# Patient Record
Sex: Male | Born: 1945 | Race: Black or African American | Hispanic: No | Marital: Married | Smoking: Current every day smoker
Health system: Southern US, Community
[De-identification: ages and names within clinical notes are randomized; demographics above are authoritative.]

## PROBLEM LIST (undated history)

## (undated) DIAGNOSIS — M1990 Unspecified osteoarthritis, unspecified site: Secondary | ICD-10-CM

## (undated) DIAGNOSIS — I6529 Occlusion and stenosis of unspecified carotid artery: Secondary | ICD-10-CM

## (undated) DIAGNOSIS — D649 Anemia, unspecified: Secondary | ICD-10-CM

## (undated) DIAGNOSIS — R911 Solitary pulmonary nodule: Secondary | ICD-10-CM

## (undated) DIAGNOSIS — I10 Essential (primary) hypertension: Secondary | ICD-10-CM

## (undated) DIAGNOSIS — F4310 Post-traumatic stress disorder, unspecified: Secondary | ICD-10-CM

## (undated) DIAGNOSIS — N183 Chronic kidney disease, stage 3 unspecified: Secondary | ICD-10-CM

## (undated) DIAGNOSIS — F209 Schizophrenia, unspecified: Secondary | ICD-10-CM

## (undated) DIAGNOSIS — I480 Paroxysmal atrial fibrillation: Secondary | ICD-10-CM

## (undated) DIAGNOSIS — Z87442 Personal history of urinary calculi: Secondary | ICD-10-CM

## (undated) DIAGNOSIS — G8929 Other chronic pain: Secondary | ICD-10-CM

## (undated) DIAGNOSIS — N189 Chronic kidney disease, unspecified: Secondary | ICD-10-CM

## (undated) DIAGNOSIS — A159 Respiratory tuberculosis unspecified: Secondary | ICD-10-CM

## (undated) DIAGNOSIS — M549 Dorsalgia, unspecified: Secondary | ICD-10-CM

## (undated) DIAGNOSIS — I739 Peripheral vascular disease, unspecified: Secondary | ICD-10-CM

## (undated) DIAGNOSIS — I5022 Chronic systolic (congestive) heart failure: Secondary | ICD-10-CM

## (undated) DIAGNOSIS — I420 Dilated cardiomyopathy: Secondary | ICD-10-CM

## (undated) DIAGNOSIS — K219 Gastro-esophageal reflux disease without esophagitis: Secondary | ICD-10-CM

## (undated) DIAGNOSIS — F0390 Unspecified dementia without behavioral disturbance: Secondary | ICD-10-CM

## (undated) DIAGNOSIS — G629 Polyneuropathy, unspecified: Secondary | ICD-10-CM

## (undated) DIAGNOSIS — M21371 Foot drop, right foot: Secondary | ICD-10-CM

## (undated) DIAGNOSIS — K5900 Constipation, unspecified: Secondary | ICD-10-CM

## (undated) HISTORY — PX: HERNIA REPAIR: SHX51

## (undated) HISTORY — PX: COLONOSCOPY: SHX174

## (undated) HISTORY — PX: APPENDECTOMY: SHX54

## (undated) HISTORY — PX: SHOULDER SURGERY: SHX246

## (undated) HISTORY — PX: EYE SURGERY: SHX253

## (undated) HISTORY — PX: CHOLECYSTECTOMY: SHX55

## (undated) HISTORY — PX: BLADDER SURGERY: SHX569

## (undated) HISTORY — DX: Anemia, unspecified: D64.9

## (undated) HISTORY — PX: BACK SURGERY: SHX140

---

## 1998-04-19 ENCOUNTER — Emergency Department (HOSPITAL_COMMUNITY): Admission: EM | Admit: 1998-04-19 | Discharge: 1998-04-19 | Payer: Self-pay | Admitting: Internal Medicine

## 2000-07-08 ENCOUNTER — Encounter: Admission: RE | Admit: 2000-07-08 | Discharge: 2000-10-06 | Payer: Self-pay | Admitting: Internal Medicine

## 2002-09-25 ENCOUNTER — Inpatient Hospital Stay (HOSPITAL_COMMUNITY): Admission: EM | Admit: 2002-09-25 | Discharge: 2002-10-01 | Payer: Self-pay | Admitting: Internal Medicine

## 2002-09-25 ENCOUNTER — Encounter: Payer: Self-pay | Admitting: Emergency Medicine

## 2002-11-03 ENCOUNTER — Ambulatory Visit (HOSPITAL_COMMUNITY): Admission: RE | Admit: 2002-11-03 | Discharge: 2002-11-03 | Payer: Self-pay | Admitting: Internal Medicine

## 2002-11-04 ENCOUNTER — Ambulatory Visit (HOSPITAL_COMMUNITY): Admission: RE | Admit: 2002-11-04 | Discharge: 2002-11-04 | Payer: Self-pay | Admitting: Internal Medicine

## 2002-11-04 ENCOUNTER — Encounter: Payer: Self-pay | Admitting: Internal Medicine

## 2002-12-29 ENCOUNTER — Ambulatory Visit (HOSPITAL_COMMUNITY): Admission: RE | Admit: 2002-12-29 | Discharge: 2002-12-29 | Payer: Self-pay | Admitting: Internal Medicine

## 2003-01-04 ENCOUNTER — Ambulatory Visit (HOSPITAL_COMMUNITY): Admission: RE | Admit: 2003-01-04 | Discharge: 2003-01-04 | Payer: Self-pay | Admitting: Internal Medicine

## 2003-01-19 ENCOUNTER — Ambulatory Visit (HOSPITAL_COMMUNITY): Admission: RE | Admit: 2003-01-19 | Discharge: 2003-01-19 | Payer: Self-pay | Admitting: Urology

## 2003-02-23 ENCOUNTER — Ambulatory Visit (HOSPITAL_COMMUNITY): Admission: RE | Admit: 2003-02-23 | Discharge: 2003-02-23 | Payer: Self-pay | Admitting: Urology

## 2003-03-09 ENCOUNTER — Observation Stay (HOSPITAL_COMMUNITY): Admission: RE | Admit: 2003-03-09 | Discharge: 2003-03-10 | Payer: Self-pay | Admitting: General Surgery

## 2003-06-17 ENCOUNTER — Ambulatory Visit (HOSPITAL_COMMUNITY): Admission: RE | Admit: 2003-06-17 | Discharge: 2003-06-17 | Payer: Self-pay | Admitting: Internal Medicine

## 2003-07-13 ENCOUNTER — Emergency Department (HOSPITAL_COMMUNITY): Admission: EM | Admit: 2003-07-13 | Discharge: 2003-07-13 | Payer: Self-pay | Admitting: Emergency Medicine

## 2004-04-11 ENCOUNTER — Ambulatory Visit (HOSPITAL_COMMUNITY): Admission: RE | Admit: 2004-04-11 | Discharge: 2004-04-11 | Payer: Self-pay | Admitting: Urology

## 2004-05-08 ENCOUNTER — Emergency Department (HOSPITAL_COMMUNITY): Admission: EM | Admit: 2004-05-08 | Discharge: 2004-05-08 | Payer: Self-pay | Admitting: Emergency Medicine

## 2004-06-29 ENCOUNTER — Ambulatory Visit (HOSPITAL_COMMUNITY): Admission: RE | Admit: 2004-06-29 | Discharge: 2004-06-29 | Payer: Self-pay | Admitting: Internal Medicine

## 2004-08-14 ENCOUNTER — Ambulatory Visit: Payer: Self-pay | Admitting: Infectious Diseases

## 2004-08-15 ENCOUNTER — Ambulatory Visit (HOSPITAL_COMMUNITY): Admission: RE | Admit: 2004-08-15 | Discharge: 2004-08-15 | Payer: Self-pay | Admitting: Infectious Diseases

## 2004-09-13 ENCOUNTER — Ambulatory Visit: Payer: Self-pay | Admitting: Infectious Diseases

## 2004-10-03 ENCOUNTER — Ambulatory Visit (HOSPITAL_COMMUNITY): Admission: RE | Admit: 2004-10-03 | Discharge: 2004-10-03 | Payer: Self-pay | Admitting: Internal Medicine

## 2004-12-18 ENCOUNTER — Ambulatory Visit (HOSPITAL_COMMUNITY): Admission: RE | Admit: 2004-12-18 | Discharge: 2004-12-18 | Payer: Self-pay | Admitting: Urology

## 2004-12-22 ENCOUNTER — Inpatient Hospital Stay (HOSPITAL_COMMUNITY): Admission: RE | Admit: 2004-12-22 | Discharge: 2004-12-26 | Payer: Self-pay | Admitting: Neurosurgery

## 2005-03-12 ENCOUNTER — Ambulatory Visit (HOSPITAL_COMMUNITY): Admission: RE | Admit: 2005-03-12 | Discharge: 2005-03-12 | Payer: Self-pay | Admitting: Internal Medicine

## 2005-03-30 ENCOUNTER — Emergency Department (HOSPITAL_COMMUNITY): Admission: EM | Admit: 2005-03-30 | Discharge: 2005-03-31 | Payer: Self-pay | Admitting: Emergency Medicine

## 2005-04-27 ENCOUNTER — Ambulatory Visit (HOSPITAL_COMMUNITY): Admission: RE | Admit: 2005-04-27 | Discharge: 2005-04-27 | Payer: Self-pay | Admitting: Urology

## 2006-04-04 ENCOUNTER — Ambulatory Visit (HOSPITAL_BASED_OUTPATIENT_CLINIC_OR_DEPARTMENT_OTHER): Admission: RE | Admit: 2006-04-04 | Discharge: 2006-04-04 | Payer: Self-pay | Admitting: Orthopedic Surgery

## 2006-08-02 ENCOUNTER — Ambulatory Visit (HOSPITAL_COMMUNITY): Admission: RE | Admit: 2006-08-02 | Discharge: 2006-08-02 | Payer: Self-pay | Admitting: Internal Medicine

## 2006-08-16 ENCOUNTER — Encounter: Payer: Self-pay | Admitting: Infectious Diseases

## 2007-01-09 ENCOUNTER — Ambulatory Visit (HOSPITAL_COMMUNITY): Admission: RE | Admit: 2007-01-09 | Discharge: 2007-01-09 | Payer: Self-pay | Admitting: Urology

## 2007-03-10 ENCOUNTER — Ambulatory Visit (HOSPITAL_COMMUNITY): Admission: RE | Admit: 2007-03-10 | Discharge: 2007-03-10 | Payer: Self-pay | Admitting: Internal Medicine

## 2007-07-07 ENCOUNTER — Ambulatory Visit (HOSPITAL_COMMUNITY): Admission: RE | Admit: 2007-07-07 | Discharge: 2007-07-07 | Payer: Self-pay | Admitting: Internal Medicine

## 2007-10-16 ENCOUNTER — Ambulatory Visit (HOSPITAL_COMMUNITY): Admission: RE | Admit: 2007-10-16 | Discharge: 2007-10-16 | Payer: Self-pay | Admitting: Neurosurgery

## 2008-02-06 ENCOUNTER — Inpatient Hospital Stay (HOSPITAL_COMMUNITY): Admission: RE | Admit: 2008-02-06 | Discharge: 2008-02-10 | Payer: Self-pay | Admitting: Neurosurgery

## 2008-02-16 ENCOUNTER — Ambulatory Visit: Payer: Self-pay | Admitting: Vascular Surgery

## 2008-02-16 ENCOUNTER — Encounter (INDEPENDENT_AMBULATORY_CARE_PROVIDER_SITE_OTHER): Payer: Self-pay | Admitting: Neurosurgery

## 2008-02-16 ENCOUNTER — Ambulatory Visit: Admission: RE | Admit: 2008-02-16 | Discharge: 2008-02-16 | Payer: Self-pay | Admitting: Neurosurgery

## 2008-10-05 ENCOUNTER — Encounter: Admission: RE | Admit: 2008-10-05 | Discharge: 2008-10-05 | Payer: Self-pay | Admitting: General Surgery

## 2008-10-08 ENCOUNTER — Ambulatory Visit (HOSPITAL_BASED_OUTPATIENT_CLINIC_OR_DEPARTMENT_OTHER): Admission: RE | Admit: 2008-10-08 | Discharge: 2008-10-08 | Payer: Self-pay | Admitting: General Surgery

## 2009-03-09 ENCOUNTER — Ambulatory Visit (HOSPITAL_COMMUNITY): Admission: RE | Admit: 2009-03-09 | Discharge: 2009-03-09 | Payer: Self-pay | Admitting: Internal Medicine

## 2009-03-10 ENCOUNTER — Inpatient Hospital Stay (HOSPITAL_COMMUNITY): Admission: EM | Admit: 2009-03-10 | Discharge: 2009-03-11 | Payer: Self-pay | Admitting: Emergency Medicine

## 2009-06-22 ENCOUNTER — Ambulatory Visit (HOSPITAL_COMMUNITY): Admission: RE | Admit: 2009-06-22 | Discharge: 2009-06-22 | Payer: Self-pay | Admitting: Internal Medicine

## 2010-02-11 ENCOUNTER — Encounter: Payer: Self-pay | Admitting: Internal Medicine

## 2010-04-10 LAB — COMPREHENSIVE METABOLIC PANEL
ALT: 21 U/L (ref 0–53)
Albumin: 4.2 g/dL (ref 3.5–5.2)
Calcium: 9.3 mg/dL (ref 8.4–10.5)
Chloride: 104 mEq/L (ref 96–112)
Creatinine, Ser: 1.2 mg/dL (ref 0.4–1.5)
Total Protein: 6.7 g/dL (ref 6.0–8.3)

## 2010-04-10 LAB — URINALYSIS, ROUTINE W REFLEX MICROSCOPIC
Glucose, UA: 500 mg/dL — AB
Ketones, ur: NEGATIVE mg/dL
Leukocytes, UA: NEGATIVE
Nitrite: NEGATIVE
Specific Gravity, Urine: 1.005 (ref 1.005–1.030)
Urobilinogen, UA: 0.2 mg/dL (ref 0.0–1.0)

## 2010-04-10 LAB — DIFFERENTIAL
Basophils Relative: 1 % (ref 0–1)
Neutro Abs: 3 10*3/uL (ref 1.7–7.7)
Neutrophils Relative %: 58 % (ref 43–77)

## 2010-04-10 LAB — CBC
HCT: 30.7 % — ABNORMAL LOW (ref 39.0–52.0)
Hemoglobin: 10.5 g/dL — ABNORMAL LOW (ref 13.0–17.0)
RBC: 3.29 MIL/uL — ABNORMAL LOW (ref 4.22–5.81)
RDW: 13.6 % (ref 11.5–15.5)

## 2010-04-10 LAB — URINE MICROSCOPIC-ADD ON

## 2010-04-12 LAB — DIFFERENTIAL: Basophils Absolute: 0 10*3/uL (ref 0.0–0.1)

## 2010-04-12 LAB — POCT I-STAT, CHEM 8
Calcium, Ion: 1.23 mmol/L (ref 1.12–1.32)
Chloride: 106 mEq/L (ref 96–112)
Glucose, Bld: 239 mg/dL — ABNORMAL HIGH (ref 70–99)
Hemoglobin: 11.6 g/dL — ABNORMAL LOW (ref 13.0–17.0)
Potassium: 4.4 mEq/L (ref 3.5–5.1)
TCO2: 28 mmol/L (ref 0–100)

## 2010-04-12 LAB — CK TOTAL AND CKMB (NOT AT ARMC)
CK, MB: 5.6 ng/mL — ABNORMAL HIGH (ref 0.3–4.0)
CK, MB: 6.4 ng/mL (ref 0.3–4.0)
CK, MB: 7.6 ng/mL (ref 0.3–4.0)
Relative Index: 1.3 (ref 0.0–2.5)
Total CK: 415 U/L — ABNORMAL HIGH (ref 7–232)

## 2010-04-12 LAB — GLUCOSE, CAPILLARY
Glucose-Capillary: 105 mg/dL — ABNORMAL HIGH (ref 70–99)
Glucose-Capillary: 185 mg/dL — ABNORMAL HIGH (ref 70–99)
Glucose-Capillary: 190 mg/dL — ABNORMAL HIGH (ref 70–99)
Glucose-Capillary: 42 mg/dL — CL (ref 70–99)
Glucose-Capillary: 46 mg/dL — ABNORMAL LOW (ref 70–99)

## 2010-04-12 LAB — URINALYSIS, ROUTINE W REFLEX MICROSCOPIC
Ketones, ur: NEGATIVE mg/dL
Nitrite: NEGATIVE
pH: 5 (ref 5.0–8.0)

## 2010-04-12 LAB — LIPID PANEL
Triglycerides: 80 mg/dL (ref <150)
VLDL: 16 mg/dL (ref 0–40)

## 2010-04-12 LAB — TROPONIN I
Troponin I: 0.01 ng/mL (ref 0.00–0.06)
Troponin I: 0.01 ng/mL (ref 0.00–0.06)

## 2010-04-12 LAB — HEMOGLOBIN A1C
Hgb A1c MFr Bld: 6.3 % — ABNORMAL HIGH (ref 4.6–6.1)
Hgb A1c MFr Bld: 6.4 % — ABNORMAL HIGH (ref 4.6–6.1)
Mean Plasma Glucose: 137 mg/dL

## 2010-04-12 LAB — APTT: aPTT: 26 seconds (ref 24–37)

## 2010-04-12 LAB — BASIC METABOLIC PANEL
BUN: 11 mg/dL (ref 6–23)
CO2: 27 mEq/L (ref 19–32)
CO2: 28 mEq/L (ref 19–32)
Calcium: 8.9 mg/dL (ref 8.4–10.5)
Calcium: 9.3 mg/dL (ref 8.4–10.5)
Creatinine, Ser: 0.91 mg/dL (ref 0.4–1.5)
GFR calc Af Amer: >60 mL/min (ref >60)
GFR calc non Af Amer: >60 mL/min (ref >60)
Glucose, Bld: 66 mg/dL — ABNORMAL LOW (ref 70–99)
Sodium: 140 mEq/L (ref 135–145)

## 2010-04-12 LAB — CBC
HCT: 33.4 % — ABNORMAL LOW (ref 39.0–52.0)
Hemoglobin: 11.1 g/dL — ABNORMAL LOW (ref 13.0–17.0)
Hemoglobin: 11.6 g/dL — ABNORMAL LOW (ref 13.0–17.0)
MCHC: 34 g/dL (ref 30.0–36.0)
MCHC: 34.2 g/dL (ref 30.0–36.0)
Platelets: 235 10*3/uL (ref 150–400)
RBC: 3.46 MIL/uL — ABNORMAL LOW (ref 4.22–5.81)
RDW: 13.4 % (ref 11.5–15.5)
WBC: 6 10*3/uL (ref 4.0–10.5)

## 2010-04-12 LAB — PROTIME-INR: INR: 1.07 (ref 0.00–1.49)

## 2010-04-12 LAB — URINE MICROSCOPIC-ADD ON

## 2010-04-12 LAB — TSH: TSH: 0.19 u[IU]/mL — ABNORMAL LOW (ref 0.350–4.500)

## 2010-04-28 LAB — CBC
HCT: 30.9 % — ABNORMAL LOW (ref 39.0–52.0)
Hemoglobin: 10.5 g/dL — ABNORMAL LOW (ref 13.0–17.0)
MCV: 94.9 fL (ref 78.0–100.0)
Platelets: 214 10*3/uL (ref 150–400)
RDW: 13.5 % (ref 11.5–15.5)

## 2010-04-28 LAB — BASIC METABOLIC PANEL
BUN: 9 mg/dL (ref 6–23)
Chloride: 102 mEq/L (ref 96–112)
GFR calc non Af Amer: >60 mL/min (ref >60)
Glucose, Bld: 255 mg/dL — ABNORMAL HIGH (ref 70–99)
Potassium: 4.2 mEq/L (ref 3.5–5.1)
Sodium: 138 mEq/L (ref 135–145)

## 2010-04-28 LAB — DIFFERENTIAL
Basophils Absolute: 0 10*3/uL (ref 0.0–0.1)
Eosinophils Absolute: 0.1 10*3/uL (ref 0.0–0.7)
Eosinophils Relative: 2 % (ref 0–5)
Lymphocytes Relative: 30 % (ref 12–46)
Lymphs Abs: 1.6 10*3/uL (ref 0.7–4.0)
Monocytes Absolute: 0.4 10*3/uL (ref 0.1–1.0)

## 2010-04-28 LAB — GLUCOSE, CAPILLARY
Glucose-Capillary: 44 mg/dL — ABNORMAL LOW (ref 70–99)
Glucose-Capillary: 57 mg/dL — ABNORMAL LOW (ref 70–99)

## 2010-05-08 LAB — GLUCOSE, CAPILLARY
Glucose-Capillary: 104 mg/dL — ABNORMAL HIGH (ref 70–99)
Glucose-Capillary: 78 mg/dL (ref 70–99)
Glucose-Capillary: 165 mg/dL — ABNORMAL HIGH (ref 70–99)
Glucose-Capillary: 167 mg/dL — ABNORMAL HIGH (ref 70–99)
Glucose-Capillary: 185 mg/dL — ABNORMAL HIGH (ref 70–99)
Glucose-Capillary: 189 mg/dL — ABNORMAL HIGH (ref 70–99)
Glucose-Capillary: 193 mg/dL — ABNORMAL HIGH (ref 70–99)
Glucose-Capillary: 244 mg/dL — ABNORMAL HIGH (ref 70–99)
Glucose-Capillary: 291 mg/dL — ABNORMAL HIGH (ref 70–99)
Glucose-Capillary: 317 mg/dL — ABNORMAL HIGH (ref 70–99)
Glucose-Capillary: 58 mg/dL — ABNORMAL LOW (ref 70–99)
Glucose-Capillary: 82 mg/dL (ref 70–99)

## 2010-05-08 LAB — COMPREHENSIVE METABOLIC PANEL
Albumin: 4.2 g/dL (ref 3.5–5.2)
BUN: 16 mg/dL (ref 6–23)
Chloride: 99 mEq/L (ref 96–112)
Creatinine, Ser: 1.2 mg/dL (ref 0.4–1.5)
GFR calc non Af Amer: >60 mL/min (ref >60)
Total Bilirubin: 0.9 mg/dL (ref 0.3–1.2)

## 2010-05-08 LAB — CBC
HCT: 33.7 % — ABNORMAL LOW (ref 39.0–52.0)
MCV: 94.5 fL (ref 78.0–100.0)
Platelets: 231 10*3/uL (ref 150–400)
WBC: 6.9 10*3/uL (ref 4.0–10.5)

## 2010-05-08 LAB — POCT I-STAT GLUCOSE
Glucose, Bld: 114 mg/dL — ABNORMAL HIGH (ref 70–99)
Glucose, Bld: 80 mg/dL (ref 70–99)
Operator id: 230421
Operator id: 230421

## 2010-05-08 LAB — POCT I-STAT 4, (NA,K, GLUC, HGB,HCT): Hemoglobin: 8.8 g/dL — ABNORMAL LOW (ref 13.0–17.0)

## 2010-05-08 LAB — TYPE AND SCREEN: Antibody Screen: NEGATIVE

## 2010-06-06 NOTE — Op Note (Signed)
NAME:  Allen [name], Allen [name] NO.:  [id number]   MEDICAL RECORD NO.:  [id number]          PATIENT TYPE:  INP   LOCATION:  3036                         FACILITY:  [facility]   PHYSICIAN:  Coletta Memos, M.D.     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   PROCEDURE:  1. Posterior lumbar interbody arthrodesis L4-5 with Opal 13-mm PEEK      cages filled with morselized autograft, posterior lumbar interbody      arthrodesis L5-S1 with 11-mm Opal interbody cages packed with      morselized autograft.  2. Posterolateral arthrodesis L4-S1 using both morselized autograft      and allograft.  3. Pedicle screw fixation segmental L4-S1 using Legacy System 6.5 x 50      screws on the right side at L4, L5, and S1, on the left side at L4,      L5 and 45 x 6.5 at S1 on the left.   COMPLICATIONS:  None.   SURGEON:  Coletta Memos, MD   ASSISTANT:  Danae Orleans. Venetia Maxon, MD   INDICATIONS:  Allen [name] is a 65 year old who has significant amount of  low back pain.  He had decided to forego any operative treatment until  recently when he stated he was simply in too much pain to try to  continue.  He therefore opted for operative decompression.   OPERATIVE NOTE:  Allen [name] was brought to the operating room,  intubated and then placed under a general anesthetic without difficulty.  Foley catheter was placed under sterile conditions.  He was rolled prone  onto a Jackson table and all pressure points were properly  padded.  His  back was prepped and he was draped in a sterile fashion.  Using an x-ray  for localization, I infiltrated 20 mL of 0.5% lidocaine and 1:200,000  strength of epinephrine into the lumbar region.  I opened the skin with  #10 blade and took the incision down to the thoracolumbar fascia  sharply.  I then exposed the lamina of L3, L4, L5 and S1 bilaterally.  Took another intraoperative x-ray to localize.  Having done this, I then  proceeded with the posterior lumbar interbody arthrodesis at L4-5 on the  right side using a high-speed drill to perform a semi-hemilaminectomy.  This was done in conjunction with the use of Kerrison punches to remove  bone until the thecal sac could be exposed by removing the ligamentum  flavum in the interlaminar space.  After that was done, significant  facetectomy was done at the L4-5 level again using Kerrison punches in  order to place a cage and then to fully  decompress the L4-L5 roots.  Having done that, I then opened the disk space and started the  diskectomy.  I also used the shavers from the [employer] to remove  more loose soft tissue.  I then turned my attention on the right side  again to L5-S1 performing a semi-hemilaminectomy removing the ligamentum  flavum and exposing the thecal sac.  I retracted that medially and then  was able to gain entry into the very narrowed disk space.  Again removed  soft tissues.  I used disk space shavers and created enough space there  to be able to place a distractor.  I then went to the left side first at  L5-S1, placed an 11-mm distractor and then proceeded with the diskectomy  on the left side.  I used both the rasp and other materials to make sure  that most of the soft tissue was gone.  I used curettes, Kerrison  punches and a drill.  Having decompressed the L5-S1 space, I sized it  and  felt that 11-mm implants would be appropriate.  Using morselized  autograft packed into bone, I then placed the  cages into the L5-S1 space  without difficulty.  I then turned my attention to the left side at L4-  5.   I completed the diskectomy at L4-5 on the left side after using a  distractor on the right to increase the size of our working space.  I  removed enough soft tissue and I felt that I had good endplates for  grafting.  I then sized the space and felt that 13-mm cages would work  best there.  I placed 13-mm cages on the right and left sides without  difficulty.  At that point, I irrigated.  I then proceeded with pedicle  screw placement.   Pedicle screws were placed at L4, L5 and S1 using fluoroscopic guidance  bilaterally.  Each hole was first sounded using the probe and a thinner  probe to make sure that there are no cutouts and pedicles then tapped  and again deepened and probed to ensure no cutouts and screw placement.  AP and lateral films at the end of the case  showed the screws were in  good position.  I then irrigated it once more.  With Dr. Fredrich Birks  assistance, we completed the posterolateral arthrodesis.   The transverse processes of L4, L5 and the ala of the sacrum were  decorticated bilaterally using the high-speed drill.  Morselized auto  and allograft was placed bilaterally.   The rods were then placed into the screw heads and the locking screws  were placed without difficulty.  Construct was in good position.  Bleeding was controlled.  I then irrigated the wound.  I then closed the  wound in layered fashion using Vicryl sutures to reapproximate the  thoracolumbar, subcutaneous and subcuticular layers.           ______________________________  Coletta Memos, M.D.     KC/MEDQ  D:  02/06/2008  T:  02/07/2008  Job:  [id number]

## 2010-06-06 NOTE — Discharge Summary (Signed)
[name], [name] NO.:  [id number]   MEDICAL RECORD NO.:  [id number]          PATIENT TYPE:  INP   LOCATION:  3036                         FACILITY:  [facility]   PHYSICIAN:  Coletta Memos, M.D.     DATE OF BIRTH:  [birth year]   DATE OF ADMISSION:  02/06/2008  DATE OF DISCHARGE:  02/09/2008                               DISCHARGE SUMMARY   ADMITTING DIAGNOSES:  1. Lumbar spondylosis, L4-5.  2. Lumbar spondylosis, L5-S1.  3. Lumbar stenosis, L4 through S1.  4. Degenerative disk disease, L4-5, L5-S1.  5. Lumbago.   DISCHARGE DIAGNOSES:  1. Lumbar spondylosis, L4-5.  2. Lumbar spondylosis, L5-S1.  3. Lumbar stenosis, L4 through S1.  4. Degenerative disk disease, L4-5, L5-S1.  5. Lumbago.   PROCEDURES:  1. Posterior lumbar interbody arthrodesis., L4-5.  2. Oval 13-mm PEEK cages, L5-S1.  3. Oval 11-mm PEEK cages filled with morselized autografts.  4. Posterolateral arthrodesis, L4 through S1.  5. Pedicle screw fixation using Legacy System from [employer].   COMPLICATIONS:  None.   SURGEON:  Coletta Memos, MD   DISCHARGE STATUS:  Alive and well.   DISCHARGE DESTINATION:  Home.   MEDICATIONS:  Percocet.   WOUND:  Clean, dry.  No signs of infection or discharge.   [name] will be sent home today.  He is tolerating a regular diet,  voiding well, walking well, no difficulties.  I will see him back in the  office in about 4 weeks.  No driving for 10 days.  He will receive a  discharge sheet.  No bending, lifting, or twisting.           ______________________________  Coletta Memos, M.D.     KC/MEDQ  D:  02/09/2008  T:  02/09/2008  Job:  [id number]

## 2010-06-09 NOTE — Op Note (Signed)
NAME:  JULIANO, MCEACHIN NO.:  [id number]   MEDICAL RECORD NO.:  [id number]            PATIENT TYPE:   LOCATION:                                 FACILITY:   PHYSICIAN:  Coletta Memos, M.D.          DATE OF BIRTH:   DATE OF PROCEDURE:  DATE OF DISCHARGE:                                 OPERATIVE REPORT   Mr. Tuggle presented with cervical stenosis and myelopathy.   DICTATION ENDED AT THIS POINT           ______________________________  Coletta Memos, M.D.     KC/MEDQ  D:  12/22/2004  T:  12/23/2004  Job:  [id number]

## 2010-06-09 NOTE — Op Note (Signed)
NAME:  Allen Yu, Allen Yu                          ACCOUNT NO.:  [id number]   MEDICAL RECORD NO.:  [id number]                   PATIENT TYPE:  AMB   LOCATION:  DAY                                  FACILITY:  [facility]   PHYSICIAN:  R. Roetta Sessions, M.D.              DATE OF BIRTH:  1945-07-13   DATE OF PROCEDURE:  DATE OF DISCHARGE:                                 OPERATIVE REPORT   PROCEDURE:  Diagnostic colonoscopy.   ENDOSCOPIST:  Gerrit Friends. Rourk, M.D.   INDICATIONS FOR PROCEDURE:  The patient is a 65 year old gentleman recently  worked up with chest pain and anemia.  He was given a couple of units of  blood down in [place] a couple of weeks ago.  Chest pain was worked up as  far as cardiac catheterization.  He was told that he had gastroesophageal  reflux disease.  He has not had any rectal bleeding or melena.  Reflux  symptoms are now controlled on Prilosec OTC.  He has a history of  gallbladder polyps seen on a 1998 ultrasound.  Colonoscopy is now being done  in part for screening and in part for diagnostic purpose.  This approach has  been discussed with the patient previously. The potential risks, benefits,  and alternatives have been reviewed.  Please see my October 12, 2002  office dictation.   PROCEDURE NOTE:  O2 saturation, blood pressure, pulse and respirations were  monitored throughout the entire procedure. Conscious sedation: Versed 3 mg  IV, Demerol 75 mg IV in divided doses.   INSTRUMENT:  Olympus video chip adult colonoscope.   FINDINGS:  Digital rectal exam revealed no abnormalities.   ENDOSCOPIC FINDINGS:  The prep was good.   RECTUM:  Examination of the rectal mucosa including the retroflex view of  the anal verge revealed no abnormalities.   COLON:  The colonic mucosa was surveyed from the rectosigmoid junction  through the left transverse and right colon to the area of the appendiceal  orifice, ileocecal valve, and cecum.  These structures were well  seen and  photographed for the record.   From this level the scope was slowly withdrawn.  All previously mentioned  mucosal surfaces were again seen.  The only abnormalities noted were a 3-mm  polyp at the splenic flexure and two 3-mm polyps at 30 cm in the sigmoid  colon.  These polyps were cold biopsied/removed.  The remainder of the  colonic mucosa appeared normal.  The patient tolerated the procedure well  and was reacted in endoscopy.   IMPRESSION:  1. Normal rectum.  2. Diminutive polyps in the sigmoid and splenic flexure cold     biopsied/removed.  3. The remainder of the colonic mucosa appeared normal.   RECOMMENDATIONS:  1. Will follow up on path.  2. Will go ahead and proceed with a right upper quadrant ultrasound to     follow up on  the previously noted gallbladder polyp to see if he has any     other findings which may have contributed to his chest pain recently.  We     will plan to see this gentleman, back in the office in 4-6 weeks.  3. Follow up on path.      ___________________________________________                                            Jonathon Bellows, M.D.   RMR/MEDQ  D:  11/03/2002  T:  11/03/2002  Job:  [id number]   cc:   Kingsley Callander. Ouida Sills, M.D.  [address]  [place]  [place] [address]  Fax: [phone]

## 2010-06-09 NOTE — H&P (Signed)
NAME:  Allen Yu, Allen Yu NO.:  [id number]   MEDICAL RECORD NO.:  [id number]                   PATIENT TYPE:  AMB   LOCATION:  DAY                                  FACILITY:  [facility]   PHYSICIAN:  Dirk Dress. Katrinka Blazing, M.D.                DATE OF BIRTH:  Jun 18, 1945   DATE OF ADMISSION:  DATE OF DISCHARGE:                                HISTORY & PHYSICAL   HISTORY OF PRESENT ILLNESS:  [age]-year-old male with a history of  recurrent abdominal pain.  The patient has been symptomatic for 5 months.  He has been evaluated by Dr. Jonathon Bellows.  He was found to have  decreased gallbladder function.  The patient also had gallbladder polyp  which was noted to have increased in size since 1998.  The patient states  that this pain is worse in the morning and at night.  He remains symptomatic  and has been referred for cholecystectomy.   PAST HISTORY:  He had chest pain in September 2004.  He had a cardiac  catheterization at that time which was normal.  Other medical illnesses are  diabetes mellitus, hypertension, hyperlipidemia, anxiety disorder, chronic  anemia, status post transfusion, September 2004, gastroesophageal reflux  disease.   MEDICATIONS:  1. Quinine 260 mg p.r.n. for cramps.  2. Humalog insulin 8 units in a.m., 10 units at noon and 5 units p.m.  3. Lantus 65 units at 9 p.m.  4. Risperdal 3 mg nightly.  5. Chlorthalidone 12.5 mg daily.  6. Lisinopril 40 mg daily.  7. Lipitor 40 mg nightly.  8. Aspirin 81 mg daily.  9. Prilosec 20 mg daily.  10.      Hygroton 12.5 mg daily.   ALLERGIES:  DARVON and CODEINE.   PHYSICAL EXAMINATION:  VITAL SIGNS:  On exam, blood pressure 140/76, pulse  80, respirations 20, weight 186 pounds.  HEENT:  Unremarkable.  NECK:  Neck is supple without JVD or bruit.  CHEST:  Chest clear to auscultation.  HEART:  Regular rate and rhythm without murmur, gallop or rub.  ABDOMEN:  Abdomen is soft and nontender.  No  masses.  EXTREMITIES:  No cyanosis, clubbing or edema.  NEUROLOGIC:  No focal motor, sensory or cerebellar deficit.   IMPRESSION:  1. Chronic cholecystitis with gallbladder polyp.  2. Diabetes mellitus.  3. Hypertension.  4. Anxiety disorder.  5. Chronic anemia.  6. Gastroesophageal reflux disease.  7. Hyperlipidemia.   PLAN:  Laparoscopic cholecystectomy.     ___________________________________________                                         Dirk Dress Katrinka Blazing, M.D.   LCS/MEDQ  D:  03/09/2003  T:  03/09/2003  Job:  [id number]

## 2010-06-09 NOTE — H&P (Signed)
NAME:  Allen Yu, Allen Yu NO.:  [id number]   MEDICAL RECORD NO.:  [id number]                   PATIENT TYPE:   LOCATION:                                       FACILITY:   PHYSICIAN:  R. Roetta Sessions, M.D.              DATE OF BIRTH:   DATE OF ADMISSION:  DATE OF DISCHARGE:                                HISTORY & PHYSICAL   CHIEF COMPLAINT:  Reported anemia and hemoccult positive stool.   Allen Yu is a 65 year old African-American who comes to see me today  describing a four-day hospitalization over the Labor Day weekend at [facility] for chest pain.  He describes undergoing cardiac  catheterization and being found to have no blockages.  He was told he had  acid reflux.  He was also told he was anemic and had blood in his stool and  needed to come and see me.  It sounds like he may have seen Dr. Lina Sar.  None of the records pertaining to the above history are available for review  at this time.  He tells me he received 2 pints of blood while he was in the  hospital one month ago.   I saw this gentleman back in 1998 for abdominal pain.  He was fairly  extensively evaluated by me and Dr. Ouida Sills without a cause for his abdominal  pain being found.  He did have a polyp in his gallbladder.  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  He states that since he stopped taking aspirin and stopped  drinking alcohol those symptoms have resolved.  He has not had any melena or  gross blood per rectum.  Again, no abdominal pain.  His weight back in 1998  was 179 pounds; he weighs 184.5 pounds today.  No odynophagia, no dysphagia.  He was given Prilosec 20 mg OTC for his chest pain which he had one month  ago and has not had any of those symptoms either.  Prior barium enema was  negative.  Sigmoidoscopy in 1998 demonstrated only a hyperplastic polyp.  CT  of the abdomen with some thickening  of the gastric wall, muscular  hypertrophy of the colon.   PAST MEDICAL HISTORY:  Significant for hypertension, hypercholesterolemia,  insulin-dependent diabetes mellitus, insomnia, posttraumatic stress syndrome  from [place] (for which he is 100% service-connected through the [employer]   PAST SURGERIES:  Appendectomy, right arm surgery.   CURRENT MEDICATIONS:  1. Prilosec 20 mg OTC.  2. Hygroton 25 mg one-half tablet daily.  3. Amitriptyline 10 mg at bedtime.  4. Lisinopril 40 mg at bedtime.  5. Lipitor 10 mg daily.  6. Quinine 260 mg at bedtime.  7. Flomax 0.4 mg at bedtime.  8. Enteric-coated aspirin 81 mg daily.  9. Risperdal 3 mg at bedtime.  10.  Lantus 65 units at bedtime.  11.      Humalog 20 units in the morning, 15 units at lunch, 8 units in the     evening, 15 units at bedtime.   ALLERGIES:  DARVON, CODEINE.   FAMILY HISTORY:  Mother is alive with lung cancer.  Father has heart disease  and is living.  There is no history of chronic GI or liver illness.   SOCIAL HISTORY:  The patient is divorced, has one child.  He is on  disability.  Smokes one pack of cigarettes per day.  No alcohol.   REVIEW OF SYSTEMS:  As in history of present illness.   PHYSICAL EXAMINATION:  GENERAL:  Reveals a pleasant 65 year old gentleman  resting comfortably.  VITAL SIGNS:  Weight 184.5, blood pressure 130/82, pulse 80.  SKIN:  Warm and dry.  HEENT:  No scleral icterus, conjunctivae are pink.  JVD is not prominent.  CHEST:  Lungs are clear to auscultation.  CARDIAC:  Regular rate and rhythm without murmur, gallop, rub.  ABDOMEN:  Nondistended, positive bowel sounds, soft, nontender, without  appreciable mass or organomegaly.  EXTREMITIES:  No edema.  RECTAL:  Deferred to time of colonoscopy.   IMPRESSION:  Allen Yu is a 65 year old gentleman who comes to see me  with a reported history of anemia requiring transfusion one month ago.  He  had chest pain.  It sounds like  he got a fairly extensive cardiac workup  including cardiac catheterization by his report.  He was told he has  gastroesophageal reflux disease.   I do not have any records for verification/documentation at this time.  At  any rate, he is 65 years old and has never had a full colonoscopy.  To this  end I have offered him a colonoscopy to go ahead and evaluate his lower GI  tract chiefly for colorectal cancer screening purposes.  Potential risks,  benefits, and alternatives have been reviewed.  In the interim hopefully  will get records from [facility] to see what has been done and see  if it sheds any further light on the situation.   He did have a 3 mm filling defect in his gallbladder in 1998 most consistent  with a polyp.  Will make further recommendations in the near future.     ___________________________________________                                         Allen Yu, M.D.   RMR/MEDQ  D:  10/22/2002  T:  10/22/2002  Job:  [id number]   cc:   Kingsley Callander. Ouida Sills, M.D.  [address]  [place]  [place] [address]  Fax: [phone]

## 2010-06-09 NOTE — Cardiovascular Report (Signed)
   NAME:  Allen Yu, Allen Yu NO.:  [id number]   MEDICAL RECORD NO.:  [id number]                   PATIENT TYPE:  INP   LOCATION:                                       FACILITY:  [facility]   PHYSICIAN:  Salvadore Farber, M.D.             DATE OF BIRTH:  1945/01/28   DATE OF PROCEDURE:  DATE OF DISCHARGE:  10/01/2002                              CARDIAC CATHETERIZATION   PROCEDURES PERFORMED:  1. Left heart catheterization.  2. Left ventriculography.  3. Coronary angiography.   CARDIOLOGIST:  Salvadore Farber, M.D.   INDICATIONS:  Mr. Heigl is a 65 year old gentleman with longstanding  diabetes mellitus and hypertension who presents with chest discomfort.  He  has ruled out for myocardial infarction.  Due to his multiple risk factors  he is referred for diagnostic angiography.   PROCEDURAL TECHNIQUE:  Informed consent was obtained.  Under 1% lidocaine  local anesthesia a 6 French sheath was placed in the right femoral artery  using the modified Seldinger technique.  Diagnostic angiography and  ventriculography were performed using JL-4, JR-4 and pigtail catheters.   The patient tolerated the procedure well an was transferred to the holding  room in stable condition.  Sheaths are to be removed there.   COMPLICATIONS:  None.   FINDINGS:  1. LV 157/14/21.  EF 65% without regional wall motion abnormality.   1. No aortic stenosis or mitral regurgitation.   1. Left Main:  Fluoroscopy reveals calcification of the distal vessel;     however, there is no stenosis.   1. LAD:  The LAD is a large vessel wrapping the apex of the heart and giving     rise to two large moderate-sized diagonals.  The vessel is     angiographically normal.   1. Circumflex:  Large vessel giving rise to two obtuse marginals.  It is     angiographically normal.   1. RCA:  Large, dominant vessel.  There is minimal luminal irregularity.   IMPRESSION AND RECOMMENDATIONS:  The  patient has no significant coronary  disease.  I suspect a gastrointestinal etiology to his chest discomfort.   Preventative efforts for coronary disease will be continued given his  multiple risk factors and evidence of coronary calcification.                                                 Salvadore Farber, M.D.    WED/MEDQ  D:  09/30/2002  T:  09/30/2002  Job:  [id number]   cc:   Kingsley Callander. Ouida Sills, M.D.  [address]  [place]  [place] [address]  Fax: [phone]   Vida Roller, M.D.  Fax: [phone]

## 2010-06-09 NOTE — Op Note (Signed)
NAME:  JERETT, ODONOHUE NO.:  [id number]   MEDICAL RECORD NO.:  [id number]          PATIENT TYPE:  AMB   LOCATION:  DSC                          FACILITY:  [facility]   PHYSICIAN:  Loreta Ave, M.D. DATE OF BIRTH:  1945-02-20   DATE OF PROCEDURE:  04/04/2006  DATE OF DISCHARGE:                               OPERATIVE REPORT   PREOPERATIVE DIAGNOSES:  Right shoulder impingement, partial tear  rotator cuff, degenerative joint disease, acromioclavicular joint.   POSTOPERATIVE DIAGNOSES:  Right shoulder impingement, partial tear  rotator cuff, degenerative joint disease, acromioclavicular joint with  some mild attritional tearing of superior and posterior labrum.  Moderate partial, but no complete cuff tear.   PROCEDURE:  1. Right shoulder exam under anesthesia.  2. Arthroscopy.  3. Debridement of labrum and cuff.  4. Acromioplasty.  5. Acromioclavicular ligament release.  6. Excision of distal clavicle.   SURGEON:  Loreta Ave, M.D.   ASSISTANT:  Genene Churn. Denton Meek.   ANESTHESIA:  General.   BLOOD LOSS:  Minimal.   SPECIMENS:  None.   CULTURES:  None.   COMPLICATIONS:  None.   DRESSING:  Soft compressive with sling.   PROCEDURE:  Patient brought to the operating room, placed on the  operating table in the supine position.  After adequate anesthesia had  been obtained, placed in the beach chair position, prepped and draped in  the usual sterile fashion.  Shoulder examined.  Tight throughout, but no  real specific adhesions.  Achieved full motion and no instability.  After being prepped and draped, 3 portals created, 1 anterior, 1  posterior, and 1 lateral.  Shoulder bone obturator arthroscope  introduced, shoulder distended and inspected.  Some attritional tearing  on top of the labrum and back of the labrum debrided.  Minimal  degenerative changes.  Biceps tendon and biceps anchor intact.  A fair  amount of partial attritional tearing on the  crescent region of the cuff  which is debrided.  No full thickness tears.  Cannula redirected  subacromially.  A lot of reactive bursitis debrided.  Attrition on top  of the cuff debrided.  Type 3 acromion.  Acromioplasty to the type and  acromion release AC ligament.  Grade 4 changes in [facility] joint.  A lateral  centimeter of clavicle resected.  Adequate AC decompression.  Clavicle  excision confirmed via small portals.  Instruments and fluid removed.  Portals closed with Nylon.  A sterile compressive dressing applied.  Sling applied.  Anesthesia reversed.  Brought to recovery room.  Tolerated surgery well with no complications.      Loreta Ave, M.D.  Electronically Signed     DFM/MEDQ  D:  04/04/2006  T:  04/05/2006  Job:  [id number]

## 2010-06-09 NOTE — Discharge Summary (Signed)
NAME:  Allen Yu, Allen Yu NO.:  [id number]   MEDICAL RECORD NO.:  [id number]                   PATIENT TYPE:  INP   LOCATION:  2011                                 FACILITY:  [facility]   PHYSICIAN:  Delbert Harness, MD              DATE OF BIRTH:  [birth year]   DATE OF ADMISSION:  09/25/2002  DATE OF DISCHARGE:  10/01/2002                           DISCHARGE SUMMARY - REFERRING   DISCHARGE DIAGNOSES:  1. Noncardiac chest pain.  2. Esophageal reflux.  3. Hypertension.  4. Schizophrenia not otherwise specified.  5. Type 2 diabetes mellitus.  6. Hyperlipidemia.  7. Normocytic anemia.  8. Tobacco use.   DISCHARGE MEDICATIONS:  1. Lisinopril 40 every day.  2. Flomax 0.4 q. h.s.  3. Chlorthalidone 12.5 mg q. a.m.  4. Lipitor 10 mg q. a.m.  5. Quinine 260 mg p.r.n. leg cramps.  6. Nortriptyline 2 mg q. h.s.  7. Prilosec over-the-counter 20 mg every day.   FOLLOW UP:  Followup appointment with Dr. Jonathon Bellows for GI workup.  The patient was given the number to call.   PROCEDURES:  Cardiac catheterization October 01, 2002. Impression, no  significant coronary disease. Suspect gastrointestinal etiology for his  chest discomfort. Preventive efforts for CAD will be continued given his  multiple risk factors and evidence of coronary calcification.   CONSULTS:  [facility] on September 30, 2002. GI doctor is Dr. Jena Gauss.   HISTORY OF PRESENT ILLNESS:  Mr. Poffenberger is a 65 year old African American  male who presents with chest pain and shortness of breath. He reports the  pain began approximately 1 p.m. It was associated with shortness of breath,  radiation to his neck. He denied nausea or vomiting. He reports the chest  pain has been present off and on for the past 3 to 4 weeks. It occurs at  rest and awakens him at night. He reports dyspnea on exertion along with  dizziness, night sweats and constipation. No pain with inspiration, no  melena, no  hematochezia. The pain presented as 10/10 and reduced to 3/10 on  a nitroglycerin drip.   ALLERGIES:  CODEINE.   FAMILY HISTORY:  Mother alive at 86 with lung carcinoma and heart disease.  Father alive at 37 with heart  disease. He reports he has a sister with  heart disease and one son with diabetes mellitus.   SOCIAL HISTORY:  He lives in [place] with his girlfriend. He lives on  disability. He smokes 1 pack per day for the last 35 years. Occasional  alcohol. Marijuana use 3 times per week, no  IV drugs.   PHYSICAL EXAMINATION:  GENERAL:  Resting in bed in no apparent distress.  NECK:  Supple, no lymphadenopathy, no JVD, no bruits.  CARDIOVASCULAR:  Distant heart sounds.  LUNGS:  Clear to auscultation bilaterally.  ABDOMEN:  Benign.  EXTREMITIES:  No clubbing, cyanosis or edema. Normal DP pulses bilaterally.  Feet warm. Groin on bruits.  NEUROLOGIC:  Cranial nerves 2 to 12 grossly intact with slurred speech.   LABORATORY DATA:  His EKG shows no ST changes, normal sinus rhythm at 97  beats a minute, biphasic T-waves, AVF and V3.   Admission labs:  CK 6.9, CK-MB 7.6, troponin less than 0.01. Sodium 136,  potassium 8.9, chloride 105, bicarbonate 27, BUN 27, creatinine 1.6, glucose  133. Calcium  9.0. WBC 6.2, hemoglobin 2.0, platelets 254. PTT 27, PT 12.6,  INR 0.9.   HOSPITAL COURSE:  PROBLEM #1, CHEST PAIN:  The patient has multiple risk  factors for coronary artery disease including sex, tobacco, age, diabetes  mellitus, hypertension and hyperlipidemia. The patient had a stable EKG with  negative cardiac enzymes. On admission a  nitroglycerin drip decreased his  pain. The patient was given a GI cocktail and Protonix  with some relief. He  was placed on heparin and a 2B3A inhibitor. The patient's catheterization  was postponed secondary to his decrease in hemoglobin from 10.0 to 8.7. The  patient was continued on aspirin, heparin and Integrilin. He received 1 unit  of RBCs  which increased his hemoglobin to 9.9 and a catheterization was  undertaken which showed no significant coronary artery disease with  suspected GI etiology. Preventative efforts were continued in this patient  concerning coronary artery disease.   PROBLEM #2, NORMOCYTIC ANEMIA:  The patient has a history of esophageal  and  duodenal erosions with positive clo test in the past. He was Guaiac negative  x2. He was transfused 2 units of packed red blood cells. He was found to  have a ferritin of 893. A GI consult was placed. The patient was evaluated.  The patient was placed on TPI, and further  GI workup was deferred to the  outpatient setting. An EGD will be considered.   PROBLEM #3, HYPERTENSION:  The patient was placed on 12.5 mg of Metoprolol.  The dose was then increased to 25 mg. The patient's blood pressure continued  to be elevated and HCTZ 12.5 mg was started.   PROBLEM #4, DIABETES MELLITUS:  While in house the patient was controlled on  Lantus and sliding-scale insulin. At discharge he was returned to his  outpatient medications.   Pertinent labs on October 01, 2002:  WBC 8.3, hemoglobin 10.2, hematocrit  30.2, platelets 245.  August 31, 2002, Sodium 137, potassium 3.6, chloride  103, bicarbonate 30, BUN 13, creatinine 1.4, calcium 9.6, glucose 198.  Hemoglobin A1C 6.7 on September 25, 2002. On September 29, 2002, ferritin 893,  iron 114, TIBC 313 with percent saturation 36%.                                                Delbert Harness, MD   JK/MEDQ  D:  12/22/2002  T:  12/22/2002  Job:  [id number]

## 2010-06-09 NOTE — H&P (Signed)
NAME:  Allen Yu, NEWBERN NO.:  [id number]   MEDICAL RECORD NO.:  [id number]                   PATIENT TYPE:  INP   LOCATION:  2924                                 FACILITY:  [facility]   PHYSICIAN:  [name], M.D.                 DATE OF BIRTH:  1945/10/15   DATE OF ADMISSION:  09/25/2002  DATE OF DISCHARGE:                                HISTORY & PHYSICAL   IDENTIFICATION:  Allen Yu is a 65 year old gentleman who was brought in  transfer from [facility] Emergency Room for evaluation of chest pain.   HISTORY OF PRESENT ILLNESS:  The patient has no known history of coronary  artery disease.  He does have a history of longstanding diabetes mellitus.  He presented to the [facility] Emergency Room with chest pain today at about  1 to 2 o'clock.  He said the episode was severe, substernal, nonpleuritic,  10/10 in intensity.   The patient notes over the past couple of weeks he has had chest pain on and  off, usually with activity, eases off on its own usually over about an hour.  Occasionally takes Tums for it.   Again today, his pain began at about 1 o'clock.  He was given nitroglycerin,  morphine, heparin, Integrilin with some improvement in pain.  EKG at [facility] showed no acute changes.  Initial CK was 646; however, with  the MB fraction was only 7.  Currently on these medicines, the patient's  pain is 2/10.   ALLERGIES:  CODEINE.   CURRENT MEDICATIONS:  Per Dr. Alonza Smoker notes:  1. Lipitor, dose unclear.  2. Lantus insulin 65 units.  3. Humulog 15/8/20.  4. Quinine p.r.n.  5. Lisinopril 40.  6. Chlorthalidone 12.5.   PAST MEDICAL HISTORY:  1. Diabetes mellitus for greater than 20 years.  Last hemoglobin A1C greater     than 7.  2. Hypertension.  3. Dyslipidemia.  4. Posttraumatic stress disorder.  5. Schizophrenia.  6. Tobacco use.  7. Neuropathy, hands and feet.  8. Lower extremity cramps.  9. Microalbuminuria.   SOCIAL  HISTORY:  The patient is a [place] veteran, disabled.  Has a  girlfriend.  Smokes at least a couple of packs per day, does not inhale per  his report.  Drinks occasionally.   FAMILY HISTORY:  Mother had lung cancer.  Father had coronary disease and  died at 59.  Sister with coronary disease.  One son with diabetes.   REVIEW OF SYSTEMS:  The patient complains of some constipation, some night  sweats.  Says his glucose has been under good control.  Neuropathy in both  hands and feet.  Chronic right upper extremity pain, lower extremity cramps.  The patient uses quinine for these.  No fevers.  No cough or pleuritic  component to the chest pain.   PHYSICAL EXAMINATION:  GENERAL:  The patient is in no acute distress but  complains of pain being 2/10 in intensity along with right arm pain (chronic  problem).  VITAL SIGNS:  Blood pressure 106 systolic, pulse in the 80s.  NECK:  JVP normal, no bruits.  CARDIAC:  Regular rate and rhythm.  Distant heart sounds.  Normal S1, S2.  No S3 or S4 audible.  No murmurs.  CHEST:  Nontender.  ABDOMEN:  Supple, no masses.  EXTREMITIES:  2+ femoral pulses, no bruits.  Trace to 1+ dorsalis pedis, no  edema.  Feet warm.  NEUROLOGIC:  The patient had somewhat mumbled speech.  Cranial nerves II-  XII, though, intact.  Motor 5/5 throughout.  Sensory decreased to pinprick  in the feet.   LABORATORY DATA:  Significant for CK total of 646 with MB fraction of 7.  Hemoglobin 10.   A 12-lead EKG shows normal sinus rhythm at a rate of 97 beats per minute.  Biphasic T waves in aVF and III.   IMPRESSION:  Allen Yu is a 65 year old gentleman with no known coronary  disease.  He does have many risk factor, however.  He is a little difficult  on history.  He is still complaining of pain with no acute changes.  His  history has some typical and atypical features.  However, with his risk  factors, would treat aggressively with heparin, nitroglycerin, and   antiplatelet agents.  Continue to monitor enzymes.  With his risk factors,  would recommend cardiac catheterization to define unless clinical course  changes markedly.   Will check fasting lipid panel, urinalysis, TSH, and repeat CBC and CMET.                                                [name], M.D.    PVR/MEDQ  D:  09/25/2002  T:  09/26/2002  Job:  [id number]

## 2010-06-09 NOTE — Op Note (Signed)
NAME:  Allen Yu, Allen Yu NO.:  [id number]   MEDICAL RECORD NO.:  [id number]                   PATIENT TYPE:  AMB   LOCATION:  DAY                                  FACILITY:  [facility]   PHYSICIAN:  Dirk Dress. Katrinka Blazing, M.D.                DATE OF BIRTH:  July 19, 1945   DATE OF PROCEDURE:  DATE OF DISCHARGE:                                 OPERATIVE REPORT   PREOPERATIVE DIAGNOSIS:  Chronic cholecystitis with gallbladder polyps.   POSTOPERATIVE DIAGNOSIS:  Chronic cholecystitis with gallbladder polyps.   PROCEDURE:  Laparoscopic cholecystectomy.   SURGEON:  Dirk Dress. Katrinka Blazing, M.D.   DESCRIPTION OF PROCEDURE:  Under general anesthesia the patient's abdomen  was prepped and draped in a sterile field.  A supraumbilical midline  incision was made.  A Veress needle was inserted uneventfully.  The abdomen  was insufflated with 2.5 liters of CO2.  Using a Vis-A-Port guide a 10-mm  port was placed.  A laparoscope was placed; and the gallbladder was  visualized.   Under videoscopic guidance a 10-mm port and two 5-mm ports were placed in  the right subcostal area.  The gallbladder was grasped and position.  There  were adhesions to the wall of the gallbladder which were bluntly dissected  and cauterized.  The cystic artery was dissected, clipped with 4 clips and  divided.  The cystic duct was dissected, close to the gallbladder; clipped  with 5 clips and divided.  The gallbladder was then separated from the  infrahepatic space using electrocautery.  It was grasped and retrieved  intact.   Irrigation was carried out.  There was no bleeding from the bed.  There was  no evidence of bile leak.  The patient tolerated the procedure well.  CO2  was allowed to escape from the abdomen.  The ports were removed.  The  incisions were closed using #0 Vicryl on the fascia of the larger incisions.  Skin and subcutaneous tissue were closed with staples.  The patient  tolerated the  procedure well.  Dressings were placed.  He was awakened from  anesthesia uneventfully, transferred to a bed, and taken to the  postanesthetic area for further monitoring.      ___________________________________________                                            Dirk Dress. Katrinka Blazing, M.D.   LCS/MEDQ  D:  03/09/2003  T:  03/09/2003  Job:  [id number]   cc:   Kingsley Callander. Ouida Sills, M.D.  [address]  [place]  [place] [address]  Fax: [phone]   R. Roetta Sessions, M.D.  [address]  [place]  [place] [address]  Fax: [phone]

## 2010-06-09 NOTE — Op Note (Signed)
NAME:  Allen Yu, Allen Yu                          ACCOUNT NO.:  [id number]   MEDICAL RECORD NO.:  [id number]                   PATIENT TYPE:  AMB   LOCATION:  DAY                                  FACILITY:  [facility]   PHYSICIAN:  R. Roetta Sessions, M.D.              DATE OF BIRTH:  1945/09/29   DATE OF PROCEDURE:  12/29/2002  DATE OF DISCHARGE:                                 OPERATIVE REPORT   PROCEDURE:  Diagnostic esophagogastroduodenoscopy.   ENDOSCOPIST:  Gerrit Friends. Rourk, M.D.   INDICATIONS FOR PROCEDURE:  The patient is a 65 year old gentleman with a  recent hospitalization for atypical chest pain, anemia, requiring  transfusion.  He was Hemoccult positive.  Colonoscopy could demonstrate only  a couple of small polyps which turned out to be hyperplastic.  Ultrasound  demonstrated a 7-mm, hyperplastic polyp which has over doubled in size since  1998.  EGD is now being done to further evaluate his anemia, and Hemoccult  positive status.  This approach has been discussed with the patient at  length.  The potential risks, benefits, and alternatives have been reviewed;  questions answered.  He is agreeable.  Please see my dictated office note of  December 08, 2002 for more information.   PROCEDURE NOTE:  O2 saturation, blood pressure, pulse and respirations were  monitored throughout the entire procedure.  Conscious sedation: Versed 3 mg  IV, Demerol 75 mg IV in divided doses.   INSTRUMENT:  Olympus video chip adult gastroscope.   FINDINGS:  Examination of the tubular esophagus revealed no mucosal  abnormalities. The EG junction was easily traversed.   STOMACH:  The gastric cavity was empty.  It insufflated well with air.  A  thorough examination of the gastric mucosa including a retroflex view of the  proximal stomach and esophagogastric junction demonstrated approximately 4  linear (3-4 cm in length) antral erosions.  There was no ulcer or  infiltrating process seen.  The pylorus  was patent and easily traversed.   Examination of the bulb and the second portion revealed no abnormalities.   THERAPEUTIC/DIAGNOSTIC MANEUVERS:  None.   The patient tolerated the procedure well and was reacted in endoscopy.   IMPRESSION:  1. Normal esophagus.  2. Linear antral erosions or uncertain clinical significant, otherwise     normal stomach, normal D1 and D2.   RECOMMENDATIONS:  1. H. pylori serologies today  2. Through my office, recently, he was noted to have an H&H of 10.9 and     32.0, MCV 91.6.  Urinalysis demonstrated a moderate amount of blood for     which we have recommended that he seek out a urology consultation with     Drs. Rito Ehrlich and [place].  3. As far as his chest pain is concerned which had some atypical features I     wonder if he does not have occult gallbladder disease to account for his  symptoms.  A gallbladder polyp has doubled in size since 1998 which is     somewhat concerning to me.   RECOMMENDATIONS:  1. Check H. pylori serologies today.  2. Urology referral to Drs. Jerre Simon and [place].  3. HIDA scan with CCK challenge in the near future.  4. Further recommendations to follow.      ___________________________________________                                            Jonathon Bellows, M.D.   RMR/MEDQ  D:  12/29/2002  T:  12/29/2002  Job:  [id number]   cc:   Kingsley Callander. Ouida Sills, M.D.  [address]  [place]  [place] [address]  Fax: [phone]

## 2010-06-09 NOTE — Op Note (Signed)
NAME:  Allen, Yu NO.:  [id number]   MEDICAL RECORD NO.:  [id number]          PATIENT TYPE:  INP   LOCATION:  3003                         FACILITY:  [facility]   PHYSICIAN:  Coletta Memos, M.D.     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   PROCEDURES:  1.  Anterior cervical decompression, C4-C5, C5-C6, C6-C7.  2.  Arthrodesis C4 to C7.  3.  Anterior plating C4 to C7 with Synthes ACCS plate and morselized      allograft DBX putty used along with Peak implants, 2 x 7-mm , 1 x 6-mm.   COMPLICATIONS:  None.   SURGEON:  Coletta Memos, M.D.   ASSISTANT:  Hewitt Shorts, M.D.   INDICATIONS:  Allen Yu is a gentleman who presented to my office with  evidence of myelopathy and weakness in the upper extremities. He had severe  spondylitic changes at C4-C5, C5-C6, and C6-C7, degenerative disk disease at  C4-C5, C5-C6, and C6-C7.   ANESTHESIA:  General endotracheal.   OPERATIVE NOTE:  Allen Yu was brought to the operating room, intubated,  and placed under general anesthetic without difficulty. His neck was placed  in the neutral position on a horseshoe headrest. His neck was prepped, and  he was draped in a sterile fashion. I infiltrated 3 cc of 0.5% lidocaine  with 1:200,000 strength epinephrine in the cervical region starting from the  midline and extending to the medial border of the left sternocleidomastoid.  I opened the skin with a #10 blade and  took this down to the platysma. I  dissected rostrally and caudally in a plane  superior to the platysma. I  opened the platysma in a horizontal fashion using Metzenbaum scissors. I  then dissected inferior to the platysma rostrally and caudally. I then, with  blunt dissection, created an avascular corridor to the cervical spine. I  placed a spinal needle, and it showed that I was at C4-C5. I then reflected  the longus colli muscles bilaterally from C4 to C7. I placed a self-  retaining retractor and then opened the disk spaces at C4-C5, C5-C6, and C6-  C7, and did partial diskectomies at each level using pituitary rongeurs and  curettes. I also used a drill to remove some overhanging osteophytes on the  disk space. I then brought in the microscope, placed two distracting pins,  one at C6, the other at C7, distracted the disk space, and proceeded to  complete the diskectomy and remove the compression on the spinal canal. I  used the drill for a great deal of the dissection as he had very large  osteophyte off both C6 and C7. I decompressed thoroughly the spinal canal,  opening the posterior longitudinal ligament to expose the  thecal sac. I then  also decompressed thoroughly both the right and left C7 nerve roots. After  that was done. I measured the disk space and felt that it was appropriate  for a 7-mm Peak interbody graft. I then prepared the endplates using a high-  speed drill, placed  the graft filled with DBX putty, and removed the  distraction pin at C7.   I placed the distraction pin at C5, distracted the C5-C6 disk space, and  then proceeded again with the drill to drill away very large osteophytes and  to decompress the spinal canal and both C6 nerve roots on the right and left  side. When that was done, I then prepared the endplates. I placed a 6-mm  Peak interbody graft filled with DBX putty. I then removed the distraction  pin from C5 and placed it at C4.   I then completed the diskectomy at C4-C5 after distracting the disk space,  using a high-speed  drill to remove some osteophytes along with Kerrison  punches to remove the posterior longitudinal ligament as was done at each  single level with the Kerrison punches. I decompressed both C5 nerve roots.  I irrigated. I prepared the endplates for a 7-mm graft. I then placed the  graft with DBX putty. At each level, there was a great deal of redundancy in  the ligaments. The osteophytes were quite robust, and there was a great deal  of pressure on the spinal canal. It was with the use of the drill and  Kerrison punches that I was able remove the posterior osteophytes from C5,  C6, C7, and C4 and thoroughly decompress the canal. At this point, I removed  the distraction pins. With Dr. Earl Gala assistance, we then placed a 48-mm  plate, two screws in C54, two screws in C7, one screw in C5, and one screw  in C6. That was done not because there was any particular problem placing  screws at the levels, but because I felt that they were not needed. I then  irrigated the wound.  I took an x-ray, and it showed that I was in the  correct location with the plate, screws, and the that the plugs were in a  good location. I irrigated once more. I then placed a drain because there  was more ooze than I was comfortable with and which I was not able to fully  control just with pressure at the time of the surgery. There were no  bleeding points identified. I then placed a drain, then reapproximated the  platysma and subcutaneous tissue. Dermabond was used for a sterile dressing  on the wound. I tied the drain into the skin with Vicryl suture. The patient  tolerated the procedure well.           ______________________________  Coletta Memos, M.D.     KC/MEDQ  D:  12/22/2004  T:  12/24/2004  Job:  [id number]

## 2010-11-21 ENCOUNTER — Emergency Department (HOSPITAL_COMMUNITY)
Admission: EM | Admit: 2010-11-21 | Discharge: 2010-11-21 | Disposition: A | Discharge status: Home / Self Care | Payer: Medicare Other | Attending: Emergency Medicine | Admitting: Emergency Medicine

## 2010-11-21 ENCOUNTER — Emergency Department (HOSPITAL_COMMUNITY): Payer: Medicare Other

## 2010-11-21 ENCOUNTER — Encounter: Payer: Self-pay | Admitting: Emergency Medicine

## 2010-11-21 DIAGNOSIS — M545 Low back pain, unspecified: Secondary | ICD-10-CM | POA: Insufficient documentation

## 2010-11-21 DIAGNOSIS — Z79899 Other long term (current) drug therapy: Secondary | ICD-10-CM | POA: Insufficient documentation

## 2010-11-21 DIAGNOSIS — M5410 Radiculopathy, site unspecified: Secondary | ICD-10-CM

## 2010-11-21 DIAGNOSIS — I10 Essential (primary) hypertension: Secondary | ICD-10-CM | POA: Insufficient documentation

## 2010-11-21 DIAGNOSIS — Z794 Long term (current) use of insulin: Secondary | ICD-10-CM | POA: Insufficient documentation

## 2010-11-21 DIAGNOSIS — R109 Unspecified abdominal pain: Secondary | ICD-10-CM | POA: Insufficient documentation

## 2010-11-21 DIAGNOSIS — E119 Type 2 diabetes mellitus without complications: Secondary | ICD-10-CM | POA: Insufficient documentation

## 2010-11-21 HISTORY — DX: Post-traumatic stress disorder, unspecified: F43.10

## 2010-11-21 LAB — URINALYSIS, ROUTINE W REFLEX MICROSCOPIC
Glucose, UA: NEGATIVE mg/dL
Leukocytes, UA: NEGATIVE
Specific Gravity, Urine: 1.025 (ref 1.005–1.030)

## 2010-11-21 LAB — URINE MICROSCOPIC-ADD ON

## 2010-11-21 MED ORDER — KETOROLAC TROMETHAMINE 60 MG/2ML IM SOLN
60.0000 mg | Freq: Once | INTRAMUSCULAR | Status: AC
  Administered 2010-11-21: 60 mg via INTRAMUSCULAR
  Filled 2010-11-21: qty 2

## 2010-11-21 MED ORDER — CYCLOBENZAPRINE HCL 10 MG PO TABS: 10.0000 mg | ORAL_TABLET | Freq: Three times a day (TID) | ORAL | Status: AC | PRN

## 2010-11-21 MED ORDER — TRAMADOL-ACETAMINOPHEN 37.5-325 MG PO TABS: ORAL_TABLET | ORAL | Status: AC

## 2010-11-21 NOTE — ED Provider Notes (Cosign Needed)
History  Scribed for Ward Givens, MD, the patient was seen in [facility]. The chart was scribed by Gilman Schmidt. The patients care was started at 9:11 AM. CSN: [id number] Arrival date & time: 11/21/2010  8:55 AM   First MD Initiated Contact with Patient 11/21/10 [phone]      Chief Complaint  Patient presents with  . Back Pain  . Flank Pain   HPI Allen Yu is a 65 y.o. male with a history of DM and PTSD who presents to the Emergency Department complaining of lower back pain and left flank pain onset 3 weeks. Pain is described as a constant burning sensation. Pt additionally notes that pain is radiating down to left knee. Pt reports recent visit to PCP Dr. Ouida Sills for same symptoms and being treated with pain meds that he has run out of yesterday. States that symptoms are neither exacerbated nor alleviated by anything. Pt has never had similar symptoms. Patient states this pain is different from the pain is had in the past from his prior back problems. Denies any cardiology or pulmonology problems. Denies any neck pain or abdominal tenderness.  He denies nausea vomiting dysuria frequency   Primary doctor Dr Ouida Sills Neurosurgeon Dr Franky Macho  Past Medical History  Diagnosis Date  . Diabetes mellitus   . PTSD (post-traumatic stress disorder)     Past Surgical History  Procedure Date  . Bladder surgery    back surgery x3  History reviewed. No pertinent family history.  History  Substance Use Topics  . Smoking status: Current Everyday Smoker    Types: Cigarettes  . Smokeless tobacco: Not on file  . Alcohol Use: No   Patient is on disability    Review of Systems  HENT: Negative for neck pain.   Respiratory: Negative for chest tightness and shortness of breath.   Cardiovascular: Negative for chest pain.  Gastrointestinal: Negative for abdominal pain.  Genitourinary: Positive for flank pain. Negative for urgency and difficulty urinating.  Musculoskeletal: Positive for back pain.    All other systems reviewed and are negative.    Allergies  Codeine and Diovan  Home Medications    Medications  Tamsulosin HCl (FLOMAX) 0.4 MG CAPS (not administered)  atorvastatin (LIPITOR) 20 MG tablet (not administered)  losartan (COZAAR) 50 MG tablet (not administered)  aspirin EC 81 MG tablet (not administered)  insulin lispro (HUMALOG) 100 UNIT/ML injection (not administered)  insulin NPH-insulin regular (NOVOLIN 70/30) (70-30) 100 UNIT/ML injection (not administered)      BP 143/70  Pulse 87  Temp(Src) 97.8 F (36.6 C) (Oral)  Resp 20  Ht 6\' 4"  (1.93 m)  Wt 174 lb (78.926 kg)  BMI 21.18 kg/m2  SpO2 99%  Vital signs normal  Physical Exam  Constitutional: He is oriented to person, place, and time. He appears well-developed and well-nourished.  Non-toxic appearance. He does not have a sickly appearance.  HENT:  Head: Normocephalic and atraumatic.  Eyes: Conjunctivae, EOM and lids are normal. Pupils are equal, round, and reactive to light.  Neck: Trachea normal, normal range of motion and full passive range of motion without pain. Neck supple.  Cardiovascular: Regular rhythm and normal heart sounds.   Pulmonary/Chest: Effort normal and breath sounds normal. No respiratory distress.  Abdominal: Soft. Normal appearance. He exhibits no distension. There is no tenderness. There is no rebound and no CVA tenderness.  Musculoskeletal: Normal range of motion.        Patient is nontender to palpation in the cervical thoracic  and lumbar spine she has some pain to palpation in his left flank there is no swelling or bruising noted. He denies pain on range of motion.  Neurological: He is alert and oriented to person, place, and time. He has normal strength.  Skin: Skin is warm, dry and intact. No rash noted.  Psychiatric:       Patient has on affect he keeps staring at me and then mumbling he also does lot mumbling under his breath     ED Course  Procedures   DIAGNOSTIC  STUDIES: Oxygen Saturation is 99% on room air, normal by my interpretation.    COORDINATION OF CARE: 9:11AM:  - Patient evaluated by ED physician, Toradol, CT Lumbar Spine, CT Abdomen Pelvis ordered  1232 patient is ready to go home.  Results for orders placed during the hospital encounter of 11/21/10  URINALYSIS, ROUTINE W REFLEX MICROSCOPIC      Component Value Range   Color, Urine YELLOW  YELLOW    Appearance CLEAR  CLEAR    Specific Gravity, Urine 1.025  1.005 - 1.030    pH 5.5  5.0 - 8.0    Glucose, UA NEGATIVE  NEGATIVE (mg/dL)   Hgb urine dipstick TRACE (*) NEGATIVE    Bilirubin Urine NEGATIVE  NEGATIVE    Ketones, ur NEGATIVE  NEGATIVE (mg/dL)   Protein, ur 30 (*) NEGATIVE (mg/dL)   Urobilinogen, UA 0.2  0.0 - 1.0 (mg/dL)   Nitrite NEGATIVE  NEGATIVE    Leukocytes, UA NEGATIVE  NEGATIVE   URINE MICROSCOPIC-ADD ON      Component Value Range   WBC, UA 0-2  <3 (WBC/hpf)   RBC / HPF 0-2  <3 (RBC/hpf)   Laboratory interpretation positive for blood but microscopic is negative  CT Abdomen Pelvis. Reviewed by me. IMPRESSION: 1. No evidence of nephrolithiasis or ureterolithiasis. 2. 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 3. Posterior lumbar fusion appears stable. Original Report Authenticated By: Genevive Bi, M.D.  CT Lumbar Spine. Reviewed by me. IMPRESSION: 1. Status post L4-S1 fusion as described. As noted, lucency is present about the S1 screws consistent with loosening and the left S1 screw traverses the S1 foramen. No bridging bone across the L5- S1 level is identified. There is some bridging bone across L4-5. The central canal is open at both levels although there is foraminal narrowing appearing worst bilaterally L5-S1. 2. Mild to moderate central canal L3-4 due to disc bulge and ligamentum flavum thickening. 3. Moderate left foraminal narrowing L2-3. Original Report Authenticated By: Bernadene Bell. D'ALESSIO, M.D.    MDM    Diagnoses that  have been ruled out:  Diagnoses that are still under consideration:  Final diagnoses:  Flank pain  Radiculopathy    Medications  cyclobenzaprine (FLEXERIL) 10 MG tablet (not administered)  traMADol-acetaminophen (ULTRACET) 37.5-325 MG per tablet (not administered)  ketorolac (TORADOL) injection 60 mg (60 mg Intramuscular Given 11/21/10 0925)      I personally performed the services described in this documentation, which was scribed in my presence. The recorded information has been reviewed and considered. Devoria Albe, MD, FACEP        Ward Givens, MD 11/21/10 [phone]

## 2010-11-21 NOTE — ED Notes (Signed)
Pt c/o lower back pain and left flank pain x 2-3 weeks and radiates down to his left knee.

## 2010-12-06 ENCOUNTER — Other Ambulatory Visit: Payer: Self-pay | Admitting: Neurosurgery

## 2010-12-06 ENCOUNTER — Other Ambulatory Visit (HOSPITAL_COMMUNITY): Payer: Self-pay | Admitting: Neurosurgery

## 2010-12-06 DIAGNOSIS — M545 Low back pain, unspecified: Secondary | ICD-10-CM

## 2010-12-06 MED ORDER — SODIUM CHLORIDE 0.9 % IV SOLN: 4.0000 mg | Freq: Once | INTRAVENOUS | Status: DC

## 2010-12-20 ENCOUNTER — Ambulatory Visit (HOSPITAL_COMMUNITY)
Admission: RE | Admit: 2010-12-20 | Discharge: 2010-12-20 | Disposition: A | Discharge status: Home / Self Care | Payer: Medicare Other | Source: Ambulatory Visit | Attending: Neurosurgery | Admitting: Neurosurgery

## 2010-12-20 DIAGNOSIS — M545 Low back pain, unspecified: Secondary | ICD-10-CM

## 2010-12-20 DIAGNOSIS — M5126 Other intervertebral disc displacement, lumbar region: Secondary | ICD-10-CM | POA: Insufficient documentation

## 2010-12-20 DIAGNOSIS — Z981 Arthrodesis status: Secondary | ICD-10-CM | POA: Insufficient documentation

## 2010-12-20 MED ORDER — DIAZEPAM 5 MG PO TABS
10.0000 mg | ORAL_TABLET | Freq: Once | ORAL | Status: AC
  Administered 2010-12-20: 10 mg via ORAL

## 2010-12-20 MED ORDER — ONDANSETRON HCL 4 MG/2ML IJ SOLN: 4.0000 mg | Freq: Four times a day (QID) | INTRAMUSCULAR | Status: DC | PRN

## 2010-12-20 MED ORDER — OXYCODONE-ACETAMINOPHEN 5-325 MG PO TABS: 1.0000 | ORAL_TABLET | ORAL | Status: DC | PRN

## 2010-12-20 MED ORDER — IOHEXOL 180 MG/ML  SOLN
20.0000 mL | Freq: Once | INTRAMUSCULAR | Status: AC | PRN
  Administered 2010-12-20: 20 mL via INTRATHECAL

## 2010-12-20 NOTE — H&P (Signed)
Allen Yu is an 65 y.o. male.   Chief Complaint: Back pain  HPI: Back pain. S/p lumbar fusion  Past Medical History  Diagnosis Date  . Diabetes mellitus   . PTSD (post-traumatic stress disorder)     Past Surgical History  Procedure Date  . Bladder surgery     No family history on file. Social History:  reports that he has been smoking Cigarettes.  He does not have any smokeless tobacco history on file. He reports that he does not drink alcohol or use illicit drugs.  Allergies:  Allergies  Allergen Reactions  . Codeine Other (See Comments)    incoherent   . Diovan (Valsartan) Other (See Comments)    incoherent    Medications Prior to Admission  Medication Sig Dispense Refill  . aspirin EC 81 MG tablet Take 81 mg by mouth daily.        [name] atorvastatin (LIPITOR) 20 MG tablet Take 20 mg by mouth daily.        . insulin lispro (HUMALOG) 100 UNIT/ML injection Inject 10 Units into the skin 3 (three) times daily before meals.        . insulin NPH-insulin regular (NOVOLIN 70/30) (70-30) 100 UNIT/ML injection Inject 80 Units into the skin at bedtime.        [name] losartan (COZAAR) 50 MG tablet Take 50 mg by mouth daily.        . Tamsulosin HCl (FLOMAX) 0.4 MG CAPS Take 0.4 mg by mouth daily.         Medications Prior to Admission  Medication Dose Route Frequency Provider Last Rate Last Dose  . diazepam (VALIUM) tablet 10 mg  10 mg Oral Once Dyana Magner L Kasee Hantz   10 mg at 12/20/10 0911  . ondansetron (ZOFRAN) 4 mg in sodium chloride 0.9 % 50 mL IVPB  4 mg Intravenous Once Carmela Hurt        Results for orders placed during the hospital encounter of 12/20/10 (from the past 48 hour(s))  GLUCOSE, CAPILLARY     Status: Normal   Collection Time   12/20/10  7:51 AM      Component Value Range Comment   Glucose-Capillary 81  70 - 99 (mg/dL)    No results found.  Review of Systems  Constitutional: Negative.   HENT: Negative.   Eyes: Negative.   Respiratory: Negative.     Cardiovascular: Negative.   Gastrointestinal: Negative.   Genitourinary: Negative.   Musculoskeletal: Positive for back pain.  Skin: Negative.   Neurological: Negative.   Endo/Heme/Allergies: Negative.   Psychiatric/Behavioral: Negative.     There were no vitals taken for this visit. Physical Exam alert, oriented x4  Peerl, symmetric facies, tongue, uvula elevate in the midline. 5/5 strength in all extremitie Normal muscle tone and bulk Gait antalgic  Assessment/Plan Mylelogram for anatomic assesment lumbar spine.  Sentoria Brent L 12/20/2010, 9:34 AM

## 2011-01-09 ENCOUNTER — Other Ambulatory Visit: Payer: Self-pay | Admitting: Neurosurgery

## 2011-01-31 ENCOUNTER — Encounter (HOSPITAL_COMMUNITY): Payer: Self-pay | Admitting: Pharmacy Technician

## 2011-01-31 ENCOUNTER — Encounter (HOSPITAL_COMMUNITY)
Admission: RE | Admit: 2011-01-31 | Discharge: 2011-01-31 | Disposition: A | Discharge status: Home / Self Care | Payer: Medicare Other | Source: Ambulatory Visit | Attending: Neurosurgery | Admitting: Neurosurgery

## 2011-01-31 ENCOUNTER — Other Ambulatory Visit: Payer: Self-pay

## 2011-01-31 ENCOUNTER — Encounter (HOSPITAL_COMMUNITY)
Admission: RE | Admit: 2011-01-31 | Discharge: 2011-01-31 | Disposition: A | Discharge status: Home / Self Care | Payer: Medicare Other | Source: Ambulatory Visit | Attending: Anesthesiology | Admitting: Anesthesiology

## 2011-01-31 ENCOUNTER — Encounter (HOSPITAL_COMMUNITY): Payer: Self-pay

## 2011-01-31 DIAGNOSIS — T84498A Other mechanical complication of other internal orthopedic devices, implants and grafts, initial encounter: Secondary | ICD-10-CM | POA: Diagnosis not present

## 2011-01-31 DIAGNOSIS — Z472 Encounter for removal of internal fixation device: Secondary | ICD-10-CM | POA: Diagnosis not present

## 2011-01-31 DIAGNOSIS — IMO0002 Reserved for concepts with insufficient information to code with codable children: Secondary | ICD-10-CM | POA: Diagnosis not present

## 2011-01-31 DIAGNOSIS — J449 Chronic obstructive pulmonary disease, unspecified: Secondary | ICD-10-CM | POA: Diagnosis not present

## 2011-01-31 DIAGNOSIS — Z01811 Encounter for preprocedural respiratory examination: Secondary | ICD-10-CM | POA: Diagnosis not present

## 2011-01-31 DIAGNOSIS — E119 Type 2 diabetes mellitus without complications: Secondary | ICD-10-CM | POA: Diagnosis not present

## 2011-01-31 HISTORY — DX: Gastro-esophageal reflux disease without esophagitis: K21.9

## 2011-01-31 HISTORY — DX: Chronic kidney disease, unspecified: N18.9

## 2011-01-31 LAB — SURGICAL PCR SCREEN
MRSA, PCR: NEGATIVE
Staphylococcus aureus: NEGATIVE

## 2011-01-31 LAB — CBC
HCT: 33.4 % — ABNORMAL LOW (ref 39.0–52.0)
Hemoglobin: 11.3 g/dL — ABNORMAL LOW (ref 13.0–17.0)
MCHC: 33.8 g/dL (ref 30.0–36.0)
RBC: 3.67 MIL/uL — ABNORMAL LOW (ref 4.22–5.81)

## 2011-01-31 LAB — TYPE AND SCREEN: Antibody Screen: NEGATIVE

## 2011-01-31 LAB — BASIC METABOLIC PANEL
BUN: 14 mg/dL (ref 6–23)
Chloride: 104 mEq/L (ref 96–112)
GFR calc Af Amer: 84 mL/min — ABNORMAL LOW (ref >90)
Glucose, Bld: 190 mg/dL — ABNORMAL HIGH (ref 70–99)
Potassium: 4.7 mEq/L (ref 3.5–5.1)
Sodium: 139 mEq/L (ref 135–145)

## 2011-01-31 NOTE — Pre-Procedure Instructions (Addendum)
20 Allen Yu  01/31/2011   Your procedure is scheduled on:  02/07/11  Report to [facility] at 630 AM.  Call this number if you have problems the morning of surgery: [phone]   Remember:   Do not eat food:After Midnight.  May have clear liquids: up to 4 Hours before arrival.  Clear liquids include soda, tea, black coffee, apple or grape juice, broth.  Take these medicines the morning of surgery with A SIP OF WATER:               STOP asa, herbal meds, blood thinners  Do not wear jewelry, make-up or nail polish.  Do not wear lotions, powders, or perfumes. You may wear deodorant.  Do not shave 48 hours prior to surgery.  Do not bring valuables to the hospital.  Contacts, dentures or bridgework may not be worn into surgery.  Leave suitcase in the car. After surgery it may be brought to your room.  For patients admitted to the hospital, checkout time is 11:00 AM the day of discharge.   Patients discharged the day of surgery will not be allowed to drive home.  Name and phone number of your driver:sharon [phone]  Special Instructions: CHG Shower Use Special Wash: 1/2 bottle night before surgery and 1/2 bottle morning of surgery.   Please read over the following fact sheets that you were given: Pain Booklet, Coughing and Deep Breathing, Blood Transfusion Information, MRSA Information and Surgical Site Infection Prevention

## 2011-02-06 MED ORDER — CEFAZOLIN SODIUM 1-5 GM-% IV SOLN
1.0000 g | INTRAVENOUS | Status: AC
  Administered 2011-02-07: 1 g via INTRAVENOUS
  Filled 2011-02-06: qty 50

## 2011-02-07 ENCOUNTER — Inpatient Hospital Stay (HOSPITAL_COMMUNITY): Payer: Medicare Other | Admitting: Anesthesiology

## 2011-02-07 ENCOUNTER — Encounter (HOSPITAL_COMMUNITY): Payer: Self-pay | Admitting: Anesthesiology

## 2011-02-07 ENCOUNTER — Encounter (HOSPITAL_COMMUNITY)
Admission: RE | Disposition: A | Discharge status: Home / Self Care | Payer: Self-pay | Source: Ambulatory Visit | Attending: Neurosurgery

## 2011-02-07 ENCOUNTER — Inpatient Hospital Stay (HOSPITAL_COMMUNITY): Payer: Medicare Other

## 2011-02-07 ENCOUNTER — Inpatient Hospital Stay (HOSPITAL_COMMUNITY)
Admission: RE | Admit: 2011-02-07 | Discharge: 2011-02-08 | DRG: 460 | Disposition: A | Discharge status: Home / Self Care | Payer: Medicare Other | Source: Ambulatory Visit | Attending: Neurosurgery | Admitting: Neurosurgery

## 2011-02-07 ENCOUNTER — Encounter (HOSPITAL_COMMUNITY): Payer: Self-pay | Admitting: Surgery

## 2011-02-07 DIAGNOSIS — E119 Type 2 diabetes mellitus without complications: Secondary | ICD-10-CM | POA: Diagnosis not present

## 2011-02-07 DIAGNOSIS — Z01812 Encounter for preprocedural laboratory examination: Secondary | ICD-10-CM

## 2011-02-07 DIAGNOSIS — T84498A Other mechanical complication of other internal orthopedic devices, implants and grafts, initial encounter: Principal | ICD-10-CM | POA: Diagnosis present

## 2011-02-07 DIAGNOSIS — M545 Low back pain, unspecified: Secondary | ICD-10-CM | POA: Diagnosis not present

## 2011-02-07 DIAGNOSIS — Z01818 Encounter for other preprocedural examination: Secondary | ICD-10-CM

## 2011-02-07 DIAGNOSIS — F17200 Nicotine dependence, unspecified, uncomplicated: Secondary | ICD-10-CM | POA: Diagnosis present

## 2011-02-07 DIAGNOSIS — N189 Chronic kidney disease, unspecified: Secondary | ICD-10-CM | POA: Diagnosis not present

## 2011-02-07 DIAGNOSIS — Z01810 Encounter for preprocedural cardiovascular examination: Secondary | ICD-10-CM | POA: Diagnosis not present

## 2011-02-07 DIAGNOSIS — Z472 Encounter for removal of internal fixation device: Secondary | ICD-10-CM | POA: Diagnosis not present

## 2011-02-07 DIAGNOSIS — Y831 Surgical operation with implant of artificial internal device as the cause of abnormal reaction of the patient, or of later complication, without mention of misadventure at the time of the procedure: Secondary | ICD-10-CM | POA: Diagnosis present

## 2011-02-07 DIAGNOSIS — K219 Gastro-esophageal reflux disease without esophagitis: Secondary | ICD-10-CM | POA: Diagnosis not present

## 2011-02-07 DIAGNOSIS — IMO0002 Reserved for concepts with insufficient information to code with codable children: Secondary | ICD-10-CM | POA: Diagnosis not present

## 2011-02-07 DIAGNOSIS — F4310 Post-traumatic stress disorder, unspecified: Secondary | ICD-10-CM | POA: Diagnosis present

## 2011-02-07 DIAGNOSIS — Z981 Arthrodesis status: Secondary | ICD-10-CM | POA: Diagnosis not present

## 2011-02-07 LAB — GLUCOSE, CAPILLARY
Glucose-Capillary: 177 mg/dL — ABNORMAL HIGH (ref 70–99)
Glucose-Capillary: 177 mg/dL — ABNORMAL HIGH (ref 70–99)

## 2011-02-07 SURGERY — POSTERIOR LUMBAR FUSION 1 WITH HARDWARE REMOVAL
Anesthesia: General | Laterality: Left | Wound class: Clean

## 2011-02-07 MED ORDER — MORPHINE SULFATE 4 MG/ML IJ SOLN
1.0000 mg | INTRAMUSCULAR | Status: DC | PRN
  Administered 2011-02-08: 2 mg via INTRAVENOUS
  Filled 2011-02-07: qty 1

## 2011-02-07 MED ORDER — SODIUM CHLORIDE 0.9 % IV SOLN: 250.0000 mL | INTRAVENOUS | Status: DC

## 2011-02-07 MED ORDER — SODIUM CHLORIDE 0.9 % IJ SOLN
3.0000 mL | Freq: Two times a day (BID) | INTRAMUSCULAR | Status: DC
  Administered 2011-02-08: 3 mL via INTRAVENOUS

## 2011-02-07 MED ORDER — PHENOL 1.4 % MT LIQD: 1.0000 | OROMUCOSAL | Status: DC | PRN

## 2011-02-07 MED ORDER — ACETAMINOPHEN 325 MG PO TABS: 650.0000 mg | ORAL_TABLET | ORAL | Status: DC | PRN

## 2011-02-07 MED ORDER — HYDROMORPHONE HCL PF 1 MG/ML IJ SOLN
INTRAMUSCULAR | Status: AC
  Filled 2011-02-07: qty 1

## 2011-02-07 MED ORDER — 0.9 % SODIUM CHLORIDE (POUR BTL) OPTIME
TOPICAL | Status: DC | PRN
  Administered 2011-02-07: 1000 mL

## 2011-02-07 MED ORDER — DROPERIDOL 2.5 MG/ML IJ SOLN: 0.6250 mg | INTRAMUSCULAR | Status: DC | PRN

## 2011-02-07 MED ORDER — GLYCOPYRROLATE 0.2 MG/ML IJ SOLN
INTRAMUSCULAR | Status: DC | PRN
  Administered 2011-02-07: .4 mg via INTRAVENOUS

## 2011-02-07 MED ORDER — HYDROCODONE-ACETAMINOPHEN 5-325 MG PO TABS
1.0000 | ORAL_TABLET | ORAL | Status: DC | PRN
  Filled 2011-02-07: qty 2

## 2011-02-07 MED ORDER — SUFENTANIL CITRATE 50 MCG/ML IV SOLN
INTRAVENOUS | Status: DC | PRN
  Administered 2011-02-07: 5 ug via INTRAVENOUS
  Administered 2011-02-07: 20 ug via INTRAVENOUS

## 2011-02-07 MED ORDER — HYDROMORPHONE HCL PF 1 MG/ML IJ SOLN
0.2500 mg | INTRAMUSCULAR | Status: DC | PRN
  Administered 2011-02-07 (×2): 0.5 mg via INTRAVENOUS

## 2011-02-07 MED ORDER — DIAZEPAM 5 MG PO TABS: 5.0000 mg | ORAL_TABLET | Freq: Four times a day (QID) | ORAL | Status: DC | PRN

## 2011-02-07 MED ORDER — INSULIN ASPART 100 UNIT/ML ~~LOC~~ SOLN
10.0000 [IU] | Freq: Three times a day (TID) | SUBCUTANEOUS | Status: DC
  Administered 2011-02-08 (×3): 10 [IU] via SUBCUTANEOUS
  Filled 2011-02-07: qty 3

## 2011-02-07 MED ORDER — BUPIVACAINE LIPOSOME 1.3 % IJ SUSP
20.0000 mL | INTRAMUSCULAR | Status: AC
  Filled 2011-02-07: qty 20

## 2011-02-07 MED ORDER — MENTHOL 3 MG MT LOZG: 1.0000 | LOZENGE | OROMUCOSAL | Status: DC | PRN

## 2011-02-07 MED ORDER — MIDAZOLAM HCL 5 MG/5ML IJ SOLN
INTRAMUSCULAR | Status: DC | PRN
  Administered 2011-02-07: 2 mg via INTRAVENOUS

## 2011-02-07 MED ORDER — ZOLPIDEM TARTRATE 10 MG PO TABS: 10.0000 mg | ORAL_TABLET | Freq: Every evening | ORAL | Status: DC | PRN

## 2011-02-07 MED ORDER — LOSARTAN POTASSIUM 50 MG PO TABS
50.0000 mg | ORAL_TABLET | Freq: Every day | ORAL | Status: DC
  Administered 2011-02-07 – 2011-02-08 (×2): 50 mg via ORAL
  Filled 2011-02-07 (×2): qty 1

## 2011-02-07 MED ORDER — POTASSIUM CHLORIDE IN NACL 20-0.9 MEQ/L-% IV SOLN
INTRAVENOUS | Status: DC
  Administered 2011-02-07: 1000 mL via INTRAVENOUS
  Administered 2011-02-08: 05:00:00 via INTRAVENOUS
  Filled 2011-02-07 (×4): qty 1000

## 2011-02-07 MED ORDER — ONDANSETRON HCL 4 MG/2ML IJ SOLN
4.0000 mg | INTRAMUSCULAR | Status: DC | PRN
  Administered 2011-02-07 – 2011-02-08 (×2): 4 mg via INTRAVENOUS
  Filled 2011-02-07 (×2): qty 2

## 2011-02-07 MED ORDER — PANTOPRAZOLE SODIUM 40 MG PO TBEC
40.0000 mg | DELAYED_RELEASE_TABLET | Freq: Every day | ORAL | Status: DC
  Administered 2011-02-07 – 2011-02-08 (×2): 40 mg via ORAL
  Filled 2011-02-07 (×2): qty 1

## 2011-02-07 MED ORDER — PROPOFOL 10 MG/ML IV EMUL
INTRAVENOUS | Status: DC | PRN
  Administered 2011-02-07: 200 mg via INTRAVENOUS

## 2011-02-07 MED ORDER — LACTATED RINGERS IV SOLN
INTRAVENOUS | Status: DC | PRN
  Administered 2011-02-07 (×2): via INTRAVENOUS

## 2011-02-07 MED ORDER — DEXTROSE 5 % IV SOLN
INTRAVENOUS | Status: DC | PRN
  Administered 2011-02-07: 09:00:00 via INTRAVENOUS

## 2011-02-07 MED ORDER — ROCURONIUM BROMIDE 100 MG/10ML IV SOLN
INTRAVENOUS | Status: DC | PRN
  Administered 2011-02-07: 50 mg via INTRAVENOUS

## 2011-02-07 MED ORDER — ASPIRIN EC 81 MG PO TBEC
81.0000 mg | DELAYED_RELEASE_TABLET | Freq: Every day | ORAL | Status: DC
  Administered 2011-02-07 – 2011-02-08 (×2): 81 mg via ORAL
  Filled 2011-02-07 (×2): qty 1

## 2011-02-07 MED ORDER — PHENYLEPHRINE HCL 10 MG/ML IJ SOLN
INTRAMUSCULAR | Status: DC | PRN
  Administered 2011-02-07: 80 ug via INTRAVENOUS

## 2011-02-07 MED ORDER — BUPIVACAINE LIPOSOME 1.3 % IJ SUSP
INTRAMUSCULAR | Status: DC | PRN
  Administered 2011-02-07: 20 mL

## 2011-02-07 MED ORDER — NEOSTIGMINE METHYLSULFATE 1 MG/ML IJ SOLN
INTRAMUSCULAR | Status: DC | PRN
  Administered 2011-02-07: 3 mg via INTRAVENOUS

## 2011-02-07 MED ORDER — SODIUM CHLORIDE 0.9 % IJ SOLN: 3.0000 mL | INTRAMUSCULAR | Status: DC | PRN

## 2011-02-07 MED ORDER — TAMSULOSIN HCL 0.4 MG PO CAPS
0.4000 mg | ORAL_CAPSULE | Freq: Every day | ORAL | Status: DC
  Administered 2011-02-07 – 2011-02-08 (×2): 0.4 mg via ORAL
  Filled 2011-02-07 (×2): qty 1

## 2011-02-07 MED ORDER — ZOLPIDEM TARTRATE 5 MG PO TABS: 5.0000 mg | ORAL_TABLET | Freq: Every evening | ORAL | Status: DC | PRN

## 2011-02-07 MED ORDER — HEMOSTATIC AGENTS (NO CHARGE) OPTIME
TOPICAL | Status: DC | PRN
  Administered 2011-02-07: 1 via TOPICAL

## 2011-02-07 MED ORDER — ACETAMINOPHEN 650 MG RE SUPP: 650.0000 mg | RECTAL | Status: DC | PRN

## 2011-02-07 MED ORDER — OXYCODONE-ACETAMINOPHEN 5-325 MG PO TABS
1.0000 | ORAL_TABLET | ORAL | Status: DC | PRN
  Administered 2011-02-07 – 2011-02-08 (×2): 2 via ORAL
  Filled 2011-02-07 (×2): qty 2

## 2011-02-07 MED ORDER — THROMBIN 20000 UNITS EX KIT
PACK | CUTANEOUS | Status: DC | PRN
  Administered 2011-02-07: 20000 [IU] via TOPICAL

## 2011-02-07 MED ORDER — SIMVASTATIN 20 MG PO TABS
20.0000 mg | ORAL_TABLET | Freq: Every day | ORAL | Status: DC
  Administered 2011-02-07 – 2011-02-08 (×2): 20 mg via ORAL
  Filled 2011-02-07 (×2): qty 1

## 2011-02-07 MED ORDER — ONDANSETRON HCL 4 MG/2ML IJ SOLN
INTRAMUSCULAR | Status: DC | PRN
  Administered 2011-02-07: 4 mg via INTRAVENOUS

## 2011-02-07 MED ORDER — INSULIN ASPART 100 UNIT/ML ~~LOC~~ SOLN: 80.0000 [IU] | Freq: Every day | SUBCUTANEOUS | Status: DC

## 2011-02-07 MED ORDER — CEFAZOLIN SODIUM 1-5 GM-% IV SOLN
1.0000 g | Freq: Three times a day (TID) | INTRAVENOUS | Status: AC
  Administered 2011-02-07 (×2): 1 g via INTRAVENOUS
  Filled 2011-02-07 (×2): qty 50

## 2011-02-07 SURGICAL SUPPLY — 68 items
ADH SKN CLS APL DERMABOND .7 (GAUZE/BANDAGES/DRESSINGS) ×1
APL SKNCLS STERI-STRIP NONHPOA (GAUZE/BANDAGES/DRESSINGS)
BAG DECANTER FOR FLEXI CONT (MISCELLANEOUS) ×2 IMPLANT
BENZOIN TINCTURE PRP APPL 2/3 (GAUZE/BANDAGES/DRESSINGS) IMPLANT
BLADE SURG ROTATE 9660 (MISCELLANEOUS) IMPLANT
BUR MATCHSTICK NEURO 3.0 LAGG (BURR) ×2 IMPLANT
CANISTER SUCTION 2500CC (MISCELLANEOUS) ×2 IMPLANT
CLOTH BEACON ORANGE TIMEOUT ST (SAFETY) ×2 IMPLANT
CONT SPEC 4OZ CLIKSEAL STRL BL (MISCELLANEOUS) ×2 IMPLANT
COVER BACK TABLE 24X17X13 BIG (DRAPES) IMPLANT
DECANTER SPIKE VIAL GLASS SM (MISCELLANEOUS) ×2 IMPLANT
DERMABOND ADVANCED (GAUZE/BANDAGES/DRESSINGS) ×1
DERMABOND ADVANCED .7 DNX12 (GAUZE/BANDAGES/DRESSINGS) ×1 IMPLANT
DRAPE C-ARM 42X72 X-RAY (DRAPES) ×6 IMPLANT
DRAPE LAPAROTOMY 100X72X124 (DRAPES) ×2 IMPLANT
DRAPE POUCH INSTRU U-SHP 10X18 (DRAPES) ×2 IMPLANT
DRAPE SURG 17X23 STRL (DRAPES) ×2 IMPLANT
DRESSING TELFA 8X3 (GAUZE/BANDAGES/DRESSINGS) IMPLANT
DURAPREP 26ML APPLICATOR (WOUND CARE) ×2 IMPLANT
ELECT REM PT RETURN 9FT ADLT (ELECTROSURGICAL) ×2
ELECTRODE REM PT RTRN 9FT ADLT (ELECTROSURGICAL) ×1 IMPLANT
GAUZE SPONGE 4X4 16PLY XRAY LF (GAUZE/BANDAGES/DRESSINGS) IMPLANT
GLOVE BIOGEL PI IND STRL 8 (GLOVE) IMPLANT
GLOVE BIOGEL PI IND STRL 8.5 (GLOVE) IMPLANT
GLOVE BIOGEL PI INDICATOR 8 (GLOVE) ×2
GLOVE BIOGEL PI INDICATOR 8.5 (GLOVE) ×2
GLOVE ECLIPSE 6.5 STRL STRAW (GLOVE) ×4 IMPLANT
GLOVE EXAM NITRILE LRG STRL (GLOVE) IMPLANT
GLOVE EXAM NITRILE MD LF STRL (GLOVE) IMPLANT
GLOVE EXAM NITRILE XL STR (GLOVE) IMPLANT
GLOVE EXAM NITRILE XS STR PU (GLOVE) IMPLANT
GLOVE INDICATOR 8.0 STRL GRN (GLOVE) ×2 IMPLANT
GLOVE SURG SS PI 8.0 STRL IVOR (GLOVE) ×4 IMPLANT
GOWN BRE IMP SLV AUR LG STRL (GOWN DISPOSABLE) ×4 IMPLANT
GOWN BRE IMP SLV AUR XL STRL (GOWN DISPOSABLE) IMPLANT
GOWN STRL REIN 2XL LVL4 (GOWN DISPOSABLE) ×2 IMPLANT
GRAFT BN 5X1XSPNE CVD POST DBM (Bone Implant) IMPLANT
GRAFT BONE MAGNIFUSE 1X5CM (Bone Implant) ×2 IMPLANT
KIT BASIN OR (CUSTOM PROCEDURE TRAY) ×2 IMPLANT
KIT POSITION SURG JACKSON T1 (MISCELLANEOUS) ×2 IMPLANT
KIT ROOM TURNOVER OR (KITS) ×2 IMPLANT
NDL HYPO 21X1.5 SAFETY (NEEDLE) IMPLANT
NDL HYPO 25X1 1.5 SAFETY (NEEDLE) ×1 IMPLANT
NDL SPNL 18GX3.5 QUINCKE PK (NEEDLE) IMPLANT
NEEDLE HYPO 21X1.5 SAFETY (NEEDLE) ×2 IMPLANT
NEEDLE HYPO 25X1 1.5 SAFETY (NEEDLE) ×2 IMPLANT
NEEDLE SPNL 18GX3.5 QUINCKE PK (NEEDLE) ×2 IMPLANT
NS IRRIG 1000ML POUR BTL (IV SOLUTION) ×2 IMPLANT
PACK LAMINECTOMY NEURO (CUSTOM PROCEDURE TRAY) ×2 IMPLANT
PAD ARMBOARD 7.5X6 YLW CONV (MISCELLANEOUS) ×6 IMPLANT
ROD DANEK 40MM (Rod) ×1 IMPLANT
SCREW PEDICLE VA L635 6.5X45M (Screw) ×1 IMPLANT
SCREW PEDICLE VA L635 6.5X50M (Screw) ×1 IMPLANT
SCREW SET BREAK OFF (Screw) ×2 IMPLANT
SPONGE GAUZE 4X4 12PLY (GAUZE/BANDAGES/DRESSINGS) IMPLANT
SPONGE LAP 4X18 X RAY DECT (DISPOSABLE) IMPLANT
SPONGE SURGIFOAM ABS GEL 100 (HEMOSTASIS) ×2 IMPLANT
STRIP CLOSURE SKIN 1/2X4 (GAUZE/BANDAGES/DRESSINGS) ×1 IMPLANT
SUT PROLENE 6 0 BV (SUTURE) IMPLANT
SUT VIC AB 0 CT1 18XCR BRD8 (SUTURE) ×1 IMPLANT
SUT VIC AB 0 CT1 8-18 (SUTURE) ×2
SUT VIC AB 2-0 CT1 18 (SUTURE) ×2 IMPLANT
SUT VIC AB 3-0 SH 8-18 (SUTURE) ×3 IMPLANT
SYR 20CC LL (SYRINGE) ×1 IMPLANT
SYR 20ML ECCENTRIC (SYRINGE) ×2 IMPLANT
TOWEL OR 17X24 6PK STRL BLUE (TOWEL DISPOSABLE) ×2 IMPLANT
TOWEL OR 17X26 10 PK STRL BLUE (TOWEL DISPOSABLE) ×2 IMPLANT
WATER STERILE IRR 1000ML POUR (IV SOLUTION) ×2 IMPLANT

## 2011-02-07 NOTE — Anesthesia Postprocedure Evaluation (Signed)
Anesthesia Post Note  Patient: Allen Yu  Procedure(s) Performed:  POSTERIOR LUMBAR FUSION 1 WITH HARDWARE REMOVAL - revision of LEFT Sacral one screw  Anesthesia type: general  Patient location: PACU  Post pain: Pain level controlled  Post assessment: Patient's Cardiovascular Status Stable  Last Vitals:  Filed Vitals:   02/07/11 1312  BP:   Pulse:   Temp: 36.8 C  Resp:     Post vital signs: Reviewed and stable  Level of consciousness: sedated  Complications: No apparent anesthesia complications

## 2011-02-07 NOTE — Transfer of Care (Signed)
Immediate Anesthesia Transfer of Care Note  Patient: Allen Yu  Procedure(s) Performed:  POSTERIOR LUMBAR FUSION 1 WITH HARDWARE REMOVAL - revision of LEFT Sacral one screw  Patient Location: PACU  Anesthesia Type: General  Level of Consciousness: awake  Airway & Oxygen Therapy: Patient Spontanous Breathing and Patient connected to nasal cannula oxygen  Post-op Assessment: Report given to PACU RN, Post -op Vital signs reviewed and stable and Patient moving all extremities  Post vital signs: Reviewed and stable Filed Vitals:   02/07/11 1130  BP:   Pulse:   Temp: 36.6 C  Resp:     Complications: No apparent anesthesia complications

## 2011-02-07 NOTE — H&P (Signed)
Allen Yu is an 66 y.o. male.   Chief Complaint: Back pain HPI: Allen Yu returns with persistent back pain. 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 All hardware is intact, however the Left S1 screw is to medial. He does not now nor has he ever had weakness or pain in the left lower extremity.   Past Medical History  Diagnosis Date  . Diabetes mellitus   . Chronic kidney disease     stones hx  . PTSD (post-traumatic stress disorder)   . GERD (gastroesophageal reflux disease)     Past Surgical History  Procedure Date  . Hernia repair rt ing  . Bladder surgery     02  . Back surgery     x2  . Appendectomy   . Cholecystectomy     History reviewed. No pertinent family history. Social History:  reports that he has been smoking Cigarettes.  He has been smoking about 1 pack per day. He does not have any smokeless tobacco history on file. He reports that he does not drink alcohol or use illicit drugs.  Allergies:  Allergies  Allergen Reactions  . Codeine Other (See Comments)    incoherent   . Diovan (Valsartan) Other (See Comments)    incoherent    Medications Prior to Admission  Medication Dose Route Frequency Provider Last Rate Last Dose  . ceFAZolin (ANCEF) IVPB 1 g/50 mL premix  1 g Intravenous 60 min Pre-Op Collen Vincent L Elis Yu      . droperidol (INAPSINE) injection 0.625 mg  0.625 mg Intravenous PRN Allen Yu      . HYDROmorphone (DILAUDID) injection 0.25-0.5 mg  0.25-0.5 mg Intravenous Q5 min PRN Allen Yu      . ondansetron [facility]) 4 mg in sodium chloride 0.9 % 50 mL IVPB  4 mg Intravenous Once Allen Yu       Medications Prior to Admission  Medication Sig Dispense Refill  . aspirin EC 81 MG tablet Take 81 mg by mouth daily.        Allen Yu atorvastatin (LIPITOR) 20 MG tablet Take 20 mg by mouth daily.        . insulin lispro (HUMALOG) 100 UNIT/ML injection Inject 10 Units into the skin 3 (three) times daily before meals.         . insulin NPH-insulin regular (NOVOLIN 70/30) (70-30) 100 UNIT/ML injection Inject 80 Units into the skin at bedtime.        Allen Yu losartan (COZAAR) 50 MG tablet Take 50 mg by mouth daily.        . Tamsulosin HCl (FLOMAX) 0.4 MG CAPS Take 0.4 mg by mouth daily.          Results for orders placed during the hospital encounter of 02/07/11 (from the past 48 hour(s))  GLUCOSE, CAPILLARY     Status: Abnormal   Collection Time   02/07/11  6:19 AM      Component Value Range Comment   Glucose-Capillary 177 (*) 70 - 99 (mg/dL)    No results found.  Review of Systems  Constitutional: Positive for diaphoresis.  HENT: Positive for hearing loss.   Eyes: Negative.   Respiratory: Negative.   Cardiovascular: Negative.   Gastrointestinal: Negative.   Genitourinary: Negative.   Musculoskeletal: Positive for back pain.  Skin: Negative.   Neurological: Negative.   Endo/Heme/Allergies:       Diabetes anemia  Psychiatric/Behavioral: Negative.     Blood pressure 159/76, pulse  72, temperature 98.4 F (36.9 C), temperature source Oral, resp. rate 20, SpO2 99.00%. Physical Exam  Constitutional: He appears well-developed and well-nourished.  HENT:  Head: Normocephalic and atraumatic.  Eyes: Conjunctivae and EOM are normal. Pupils are equal, round, and reactive to light.  Neck: Neck supple.  Cardiovascular: Normal rate and normal heart sounds.   Respiratory: Effort normal and breath sounds normal.  GI: Soft. Bowel sounds are normal.  Musculoskeletal: Normal range of motion.  Skin: Skin is warm and dry.  Psychiatric: He has a normal mood and affect.     Assessment/Plan Admit for reposition of the Left S1 pedicle screw, and redo pla of the l5/s1 space.  Kristyanna Barcelo L 02/07/2011, 8:54 AM

## 2011-02-07 NOTE — Op Note (Signed)
Patient had large bowel movement end of case notice bleeding to operative site Dr Franky Macho called back to room order to apply dressing.

## 2011-02-07 NOTE — Progress Notes (Signed)
BP 156/77  Pulse 86  Temp(Src) 98.3 F (36.8 C) (Oral)  Resp 20  Ht 6\' 1"  (1.854 m)  Wt 74.39 kg (164 lb)  BMI 21.64 kg/m2  SpO2 94% Alert oriented x 4\ 5/5 strength lower extremities Wound dressing blood stained. Doing well May d/c tomorrow/

## 2011-02-07 NOTE — Anesthesia Preprocedure Evaluation (Addendum)
Anesthesia Evaluation  Patient identified by MRN, date of birth, ID band Patient awake    Reviewed: Allergy & Precautions, H&P , NPO status , Patient's Chart, lab work & pertinent test results  Airway Mallampati: I TM Distance: >3 FB Neck ROM: Full    Dental  (+) Teeth Intact and Dental Advisory Given   Pulmonary neg pulmonary ROS, Current Smoker,  clear to auscultation  Pulmonary exam normal       Cardiovascular neg cardio ROS - CAD Regular Normal    Neuro/Psych Anxiety Schizophrenia    GI/Hepatic Neg liver ROS, GERD-  Medicated and Controlled,  Endo/Other  Diabetes mellitus-, Type 2, Insulin Dependent  Renal/GU negative Renal ROS     Musculoskeletal   Abdominal   Peds  Hematology   Anesthesia Other Findings   Reproductive/Obstetrics                         Anesthesia Physical Anesthesia Plan  ASA: II  Anesthesia Plan: General   Post-op Pain Management:    Induction: Intravenous  Airway Management Planned: Oral ETT  Additional Equipment:   Intra-op Plan:   Post-operative Plan: Extubation in OR  Informed Consent: I have reviewed the patients History and Physical, chart, labs and discussed the procedure including the risks, benefits and alternatives for the proposed anesthesia with the patient or authorized representative who has indicated his/her understanding and acceptance.   Dental advisory given  Plan Discussed with: CRNA, Anesthesiologist and Surgeon  Anesthesia Plan Comments:        Anesthesia Quick Evaluation

## 2011-02-07 NOTE — Op Note (Signed)
02/07/2011  11:10 AM  PATIENT:  Allen Yu  66 y.o. 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 was brought to the operating room intubated and placed under a general anesthetic without difficulty. He was positioned on the [place] table ensuring that all pressure points were properly padded. I prepped his lumbar region with duraprep. I opened the lower portion of his old incision and exposed the hardware on the left side. I removed the caps, the rod, and the Left S1 screw. I repositioned that screw with flouroscopic guidance.  I decorticated the bone between L4 and S1 on the left side. I placed morcellized allograft to complete the arthrodesis.  I completed the construct by removing the Left L5 screw, because the repositioned screw no longer was in line with the L5 screw. I connected the L4-S1 screw with the rod and locking caps. Final xray showed the construct to be in good position.  I closed the wound in layered fashion with vicryl sutures. I used dermabond for a sterile dressing.  PLAN OF CARE: Admit to inpatient   PATIENT DISPOSITION:  PACU - hemodynamically stable.   Delay start of Pharmacological VTE agent (>24hrs) due to surgical blood loss or risk of bleeding:  yes

## 2011-02-08 MED ORDER — HYDROCODONE-ACETAMINOPHEN 5-325 MG PO TABS: 1.0000 | ORAL_TABLET | Freq: Four times a day (QID) | ORAL | Status: AC | PRN

## 2011-02-08 NOTE — Progress Notes (Signed)
Physical Therapy Evaluation Patient Details Name: Allen Yu MRN: [id number] DOB: 27-Apr-1945 Today's Date: 02/08/2011  Problem List: There is no problem list on file for this patient. [id number] Ev2  Past Medical History:  Past Medical History  Diagnosis Date  . Diabetes mellitus   . Chronic kidney disease     stones hx  . PTSD (post-traumatic stress disorder)   . GERD (gastroesophageal reflux disease)    Past Surgical History:  Past Surgical History  Procedure Date  . Hernia repair rt ing  . Bladder surgery     02  . Back surgery     x2  . Appendectomy   . Cholecystectomy     PT Assessment/Plan/Recommendation PT Assessment Clinical Impression Statement: Tolerated mobility despite report of pain.  Will benefit from skilled PT s/p redo L5/S1 fusion and repositioning of screw to ensure safety and independence with mobility prior to d/c home with wife assist.  Likely no need for f/u PT PT Recommendation/Assessment: Patient will need skilled PT in the acute care venue PT Problem List: Decreased knowledge of use of DME;Decreased knowledge of precautions;Decreased mobility;Pain PT Therapy Diagnosis : Acute pain;Difficulty walking PT Plan PT Frequency: Min 6X/week PT Treatment/Interventions: Gait training;Stair training;DME instruction;Functional mobility training;Patient/family education PT Recommendation Follow Up Recommendations: No PT follow up Equipment Recommended: None recommended by PT PT Goals  Acute Rehab PT Goals PT Goal Formulation: With patient Time For Goal Achievement: 5 days Pt will Roll Supine to Right Side: with modified independence PT Goal: Rolling Supine to Right Side - Progress: Goal set today Pt will go Supine/Side to Sit: with modified independence PT Goal: Supine/Side to Sit - Progress: Goal set today Pt will go Sit to Supine/Side: with modified independence PT Goal: Sit to Supine/Side - Progress: Goal set today Pt will go Sit to Stand: with  modified independence PT Goal: Sit to Stand - Progress: Goal set today Pt will go Stand to Sit: with modified independence PT Goal: Stand to Sit - Progress: Goal set today Pt will Ambulate: >150 feet;with modified independence;with least restrictive assistive device PT Goal: Ambulate - Progress: Goal set today Pt will Go Up / Down Stairs: 3-5 stairs;with rail(s);with supervision PT Goal: Up/Down Stairs - Progress: Goal set today  PT Evaluation Precautions/Restrictions  Precautions Precautions: Back Precaution Booklet Issued: No Precaution Comments: verbal education today in precautions Required Braces or Orthoses: No (no brace ordered) Prior Functioning  Home Living Lives With: Spouse Type of Home: House Home Layout: Two level;Able to live on main level with bedroom/bathroom Home Access: Stairs to enter Entrance Stairs-Rails: Right Entrance Stairs-Number of Steps: 3 (also only one step at front entry) Bathroom Toilet: Standard Home Adaptive Equipment: Walker - rolling Prior Function Level of Independence: Independent with basic ADLs;Independent with transfers;Independent with gait Cognition Cognition Arousal/Alertness: Awake/alert Overall Cognitive Status: Appears within functional limits for tasks assessed Orientation Level: Oriented X4 Cognition - Other Comments: hard of hearing Sensation/Coordination   Extremity Assessment RLE Assessment RLE Assessment: Not tested [facility] for ambulation) LLE Assessment LLE Assessment: Not tested [facility] for ambulation) Mobility (including Balance) Bed Mobility Bed Mobility: Yes Rolling Right: 4: Min assist Rolling Right Details (indicate cue type and reason): with rail and cues Right Sidelying to Sit: 5: Supervision;With rails Right Sidelying to Sit Details (indicate cue type and reason): with cues for back precautions Transfers Transfers: Yes Sit to Stand: 4: Min assist;With upper extremity assist;From bed Sit to Stand Details  (indicate cue type and reason): with cues and increased height  of bed Stand to Sit: 5: Supervision;With upper extremity assist;With armrests;To chair/3-in-1 Ambulation/Gait Ambulation/Gait: Yes Ambulation/Gait Assistance: 4: Min assist Ambulation/Gait Assistance Details (indicate cue type and reason): minguard assist Ambulation Distance (Feet): 200 Feet Assistive device: Rolling walker Gait Pattern: Within Functional Limits  Posture/Postural Control Posture/Postural Control: No significant limitations Exercise    End of Session PT - End of Session Equipment Utilized During Treatment: Gait belt Activity Tolerance: Patient tolerated treatment well Patient left: in chair;with call bell in reach;with family/visitor present Nurse Communication: Mobility status for transfers General Behavior During Session: [facility] for tasks performed Cognition: [facility] for tasks performed  [facility] 02/08/2011, 1:23 PM

## 2011-02-08 NOTE — Progress Notes (Signed)
UR COMPLETED  

## 2011-02-08 NOTE — Discharge Summary (Signed)
BP 134/72  Pulse 75  Temp(Src) 99 F (37.2 C) (Oral)  Resp 18  Ht 6\' 1"  (1.854 m)  Wt 74.39 kg (164 lb)  BMI 21.64 kg/m2  SpO2 99% Mr. Gacek was admitted for treatment of low back pain. He underwent a previous Lumbar fusion from L4-S1. 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 He had done well for approximately 2 years before coming back with complaints of pain. He was taken to the operating room and had his Left S1 screw revised. He never complained of pain in the left lower extremity nor did he have neurologic deficits in the S1 territory.  Post op he has voided, ambulated and tolerated a regular diet. His wound is clean dry and without signs of infection.  5/5 strength in thelower extremities.

## 2011-02-12 LAB — GLUCOSE, CAPILLARY: Glucose-Capillary: 159 mg/dL — ABNORMAL HIGH (ref 70–99)

## 2011-03-05 DIAGNOSIS — E109 Type 1 diabetes mellitus without complications: Secondary | ICD-10-CM | POA: Diagnosis not present

## 2011-03-13 DIAGNOSIS — E119 Type 2 diabetes mellitus without complications: Secondary | ICD-10-CM | POA: Diagnosis not present

## 2011-03-27 DIAGNOSIS — N400 Enlarged prostate without lower urinary tract symptoms: Secondary | ICD-10-CM | POA: Diagnosis not present

## 2011-03-27 DIAGNOSIS — N529 Male erectile dysfunction, unspecified: Secondary | ICD-10-CM | POA: Diagnosis not present

## 2011-04-13 DIAGNOSIS — M545 Low back pain, unspecified: Secondary | ICD-10-CM | POA: Diagnosis not present

## 2011-05-01 DIAGNOSIS — M545 Low back pain, unspecified: Secondary | ICD-10-CM | POA: Diagnosis not present

## 2011-06-21 DIAGNOSIS — N419 Inflammatory disease of prostate, unspecified: Secondary | ICD-10-CM | POA: Diagnosis not present

## 2011-07-04 ENCOUNTER — Encounter (HOSPITAL_COMMUNITY): Payer: Self-pay | Admitting: *Deleted

## 2011-07-04 ENCOUNTER — Emergency Department (HOSPITAL_COMMUNITY)
Admission: EM | Admit: 2011-07-04 | Discharge: 2011-07-04 | Disposition: A | Discharge status: Home / Self Care | Payer: Medicare Other | Attending: Emergency Medicine | Admitting: Emergency Medicine

## 2011-07-04 ENCOUNTER — Emergency Department (HOSPITAL_COMMUNITY): Payer: Medicare Other

## 2011-07-04 DIAGNOSIS — N189 Chronic kidney disease, unspecified: Secondary | ICD-10-CM | POA: Insufficient documentation

## 2011-07-04 DIAGNOSIS — E119 Type 2 diabetes mellitus without complications: Secondary | ICD-10-CM | POA: Insufficient documentation

## 2011-07-04 DIAGNOSIS — F4310 Post-traumatic stress disorder, unspecified: Secondary | ICD-10-CM | POA: Diagnosis not present

## 2011-07-04 DIAGNOSIS — R110 Nausea: Secondary | ICD-10-CM | POA: Diagnosis not present

## 2011-07-04 DIAGNOSIS — M545 Low back pain, unspecified: Secondary | ICD-10-CM | POA: Diagnosis not present

## 2011-07-04 DIAGNOSIS — F17200 Nicotine dependence, unspecified, uncomplicated: Secondary | ICD-10-CM | POA: Insufficient documentation

## 2011-07-04 DIAGNOSIS — M47817 Spondylosis without myelopathy or radiculopathy, lumbosacral region: Secondary | ICD-10-CM | POA: Diagnosis not present

## 2011-07-04 DIAGNOSIS — K219 Gastro-esophageal reflux disease without esophagitis: Secondary | ICD-10-CM | POA: Insufficient documentation

## 2011-07-04 DIAGNOSIS — M549 Dorsalgia, unspecified: Secondary | ICD-10-CM | POA: Diagnosis not present

## 2011-07-04 DIAGNOSIS — Z981 Arthrodesis status: Secondary | ICD-10-CM | POA: Diagnosis not present

## 2011-07-04 LAB — URINALYSIS, ROUTINE W REFLEX MICROSCOPIC
Bilirubin Urine: NEGATIVE
Ketones, ur: NEGATIVE mg/dL
Specific Gravity, Urine: 1.025 (ref 1.005–1.030)
Urobilinogen, UA: 0.2 mg/dL (ref 0.0–1.0)

## 2011-07-04 LAB — GLUCOSE, CAPILLARY: Glucose-Capillary: 168 mg/dL — ABNORMAL HIGH (ref 70–99)

## 2011-07-04 MED ORDER — HYDROMORPHONE HCL PF 1 MG/ML IJ SOLN
1.0000 mg | Freq: Once | INTRAMUSCULAR | Status: AC
  Administered 2011-07-04: 1 mg via INTRAMUSCULAR
  Filled 2011-07-04: qty 1

## 2011-07-04 MED ORDER — HYDROCODONE-ACETAMINOPHEN 7.5-325 MG PO TABS: 1.0000 | ORAL_TABLET | ORAL | Status: AC | PRN

## 2011-07-04 MED ORDER — IBUPROFEN 800 MG PO TABS
800.0000 mg | ORAL_TABLET | Freq: Once | ORAL | Status: AC
  Administered 2011-07-04: 800 mg via ORAL
  Filled 2011-07-04: qty 1

## 2011-07-04 MED ORDER — ONDANSETRON HCL 4 MG PO TABS
4.0000 mg | ORAL_TABLET | Freq: Once | ORAL | Status: AC
  Administered 2011-07-04: 4 mg via ORAL
  Filled 2011-07-04: qty 1

## 2011-07-04 NOTE — ED Provider Notes (Signed)
Medical screening examination/treatment/procedure(s) were conducted as a shared visit with non-physician practitioner(s) and myself.  I personally evaluated the patient during the encounter   Joya Gaskins, MD 07/04/11 1056

## 2011-07-04 NOTE — Discharge Instructions (Signed)
Please use a heating pad to the lower back if possible. Norco every 4 hours for pain. Please take with food. Please see your neurosurgery MD for recheck .Back Pain, Adult Low back pain is very common. About 1 in 5 people have back pain.The cause of low back pain is rarely dangerous. The pain often gets better over time.About half of people with a sudden onset of back pain feel better in just 2 weeks. About 8 in 10 people feel better by 6 weeks.  CAUSES Some common causes of back pain include:  Strain of the muscles or ligaments supporting the spine.   Wear and tear (degeneration) of the spinal discs.   Arthritis.   Direct injury to the back.  DIAGNOSIS Most of the time, the direct cause of low back pain is not known.However, back pain can be treated effectively even when the exact cause of the pain is unknown.Answering your caregiver's questions about your overall health and symptoms is one of the most accurate ways to make sure the cause of your pain is not dangerous. If your caregiver needs more information, he or she may order lab work or imaging tests (X-rays or MRIs).However, even if imaging tests show changes in your back, this usually does not require surgery. HOME CARE INSTRUCTIONS For many people, back pain returns.Since low back pain is rarely dangerous, it is often a condition that people can learn to [facility] their own.   Remain active. It is stressful on the back to sit or stand in one place. Do not sit, drive, or stand in one place for more than 30 minutes at a time. Take short walks on level surfaces as soon as pain allows.Try to increase the length of time you walk each day.   Do not stay in bed.Resting more than 1 or 2 days can delay your recovery.   Do not avoid exercise or work.Your body is made to move.It is not dangerous to be active, even though your back may hurt.Your back will likely heal faster if you return to being active before your pain is gone.   Pay  attention to your body when you bend and lift. Many people have less discomfortwhen lifting if they bend their knees, keep the load close to their bodies,and avoid twisting. Often, the most comfortable positions are those that put less stress on your recovering back.   Find a comfortable position to sleep. Use a firm mattress and lie on your side with your knees slightly bent. If you lie on your back, put a pillow under your knees.   Only take over-the-counter or prescription medicines as directed by your caregiver. Over-the-counter medicines to reduce pain and inflammation are often the most helpful.Your caregiver may prescribe muscle relaxant drugs.These medicines help dull your pain so you can more quickly return to your normal activities and healthy exercise.   Put ice on the injured area.   Put ice in a plastic bag.   Place a towel between your skin and the bag.   Leave the ice on for 15 to 20 minutes, 3 to 4 times a day for the first 2 to 3 days. After that, ice and heat may be alternated to reduce pain and spasms.   Ask your caregiver about trying back exercises and gentle massage. This may be of some benefit.   Avoid feeling anxious or stressed.Stress increases muscle tension and can worsen back pain.It is important to recognize when you are anxious or stressed and learn ways  to manage it.Exercise is a great option.  SEEK MEDICAL CARE IF:  You have pain that is not relieved with rest or medicine.   You have pain that does not improve in 1 week.   You have new symptoms.   You are generally not feeling well.  SEEK IMMEDIATE MEDICAL CARE IF:   You have pain that radiates from your back into your legs.   You develop new bowel or bladder control problems.   You have unusual weakness or numbness in your arms or legs.   You develop nausea or vomiting.   You develop abdominal pain.   You feel faint.  Document Released: 01/08/2005 Document Revised: 12/28/2010 Document  Reviewed: 05/29/2010 [facility] Patient Information 2012 [place], [place].

## 2011-07-04 NOTE — ED Notes (Signed)
17 pills left in bottle of Cipro. Rx for 28 pills on 5/30

## 2011-07-04 NOTE — ED Notes (Signed)
Pt states back pain x 2 weeks. Hx of previous back surgeries. PT states he went to see Dr. Ouida Sills and was given Cipro. States no relief in pain. Bottle still has several pills remaining but pt states he has been taking him like he is supposed to.

## 2011-07-04 NOTE — ED Provider Notes (Signed)
History     CSN: [id number]  Arrival date & time 07/04/11  4098   First MD Initiated Contact with Patient 07/04/11 [phone]      Chief Complaint  Patient presents with  . Back Pain    (Consider location/radiation/quality/duration/timing/severity/associated sxs/prior treatment) Patient is a 66 y.o. male presenting with back pain. The history is provided by the patient.  Back Pain  The current episode started more than 1 week ago. The problem occurs daily. The problem has been gradually worsening. The pain is associated with no known injury. The pain is present in the lumbar spine. The quality of the pain is described as aching. The pain does not radiate. The pain is severe. The symptoms are aggravated by certain positions. The pain is the same all the time. Associated symptoms include tingling. Pertinent negatives include no chest pain, no abdominal pain and no dysuria. Associated symptoms comments: Nausea.. Treatments tried: antibiotics. The treatment provided no relief.    Past Medical History  Diagnosis Date  . Diabetes mellitus   . Chronic kidney disease     stones hx  . PTSD (post-traumatic stress disorder)   . GERD (gastroesophageal reflux disease)     Past Surgical History  Procedure Date  . Hernia repair rt ing  . Bladder surgery     02  . Back surgery     x2  . Appendectomy   . Cholecystectomy     No family history on file.  History  Substance Use Topics  . Smoking status: Current Everyday Smoker -- 1.0 packs/day    Types: Cigarettes  . Smokeless tobacco: Not on file  . Alcohol Use: No      Review of Systems  Constitutional: Negative for activity change.       All ROS Neg except as noted in HPI  HENT: Negative for nosebleeds and neck pain.   Eyes: Negative for photophobia and discharge.  Respiratory: Negative for cough, shortness of breath and wheezing.   Cardiovascular: Negative for chest pain and palpitations.  Gastrointestinal: Positive for nausea.  Negative for abdominal pain and blood in stool.  Genitourinary: Negative for dysuria, frequency and hematuria.  Musculoskeletal: Positive for back pain. Negative for arthralgias.  Skin: Negative.   Neurological: Positive for tingling. Negative for dizziness, seizures and speech difficulty.  Psychiatric/Behavioral: Negative for hallucinations and confusion.    Allergies  Codeine and Diovan  Home Medications   Current Outpatient Rx  Name Route Sig Dispense Refill  . ASPIRIN EC 81 MG PO TBEC Oral Take 81 mg by mouth daily.      . ATORVASTATIN CALCIUM 20 MG PO TABS Oral Take 20 mg by mouth daily.      . INSULIN LISPRO (HUMAN) 100 UNIT/ML [place] SOLN Subcutaneous Inject 10 Units into the skin 3 (three) times daily before meals.      . INSULIN ISOPHANE & REGULAR (70-30) 100 UNIT/ML [place] SUSP Subcutaneous Inject 80 Units into the skin at bedtime.      [name] LOSARTAN POTASSIUM 50 MG PO TABS Oral Take 50 mg by mouth daily.      [name] OMEPRAZOLE 20 MG PO CPDR Oral Take 20 mg by mouth daily.    [name] TAMSULOSIN HCL 0.4 MG PO CAPS Oral Take 0.4 mg by mouth daily.        BP 153/88  Pulse 80  Temp 97.9 F (36.6 C) (Oral)  Resp 16  Ht 6\' 1"  (1.854 m)  Wt 164 lb (74.39 kg)  BMI 21.64 kg/m2  SpO2 98%  Physical Exam  Nursing note and vitals reviewed. Constitutional: He is oriented to person, place, and time. He appears well-developed and well-nourished.  Non-toxic appearance.  HENT:  Head: Normocephalic.  Right Ear: Tympanic membrane and external ear normal.  Left Ear: Tympanic membrane and external ear normal.  Eyes: EOM and lids are normal. Pupils are equal, round, and reactive to light.  Neck: Normal range of motion. Neck supple. Carotid bruit is not present.  Cardiovascular: Normal rate, regular rhythm, normal heart sounds, intact distal pulses and normal pulses.   Pulmonary/Chest: Breath sounds normal. No respiratory distress.  Abdominal: Soft. Bowel sounds are normal. There is no tenderness. There is  no guarding.  Musculoskeletal: Normal range of motion.       Left paraspinal muscle area tenderness noted. Pain with changes or positions and walking.  Lymphadenopathy:       Head (right side): No submandibular adenopathy present.       Head (left side): No submandibular adenopathy present.    He has no cervical adenopathy.  Neurological: He is alert and oriented to person, place, and time. He has normal strength. No cranial nerve deficit or sensory deficit.  Skin: Skin is warm and dry.  Psychiatric: He has a normal mood and affect. His speech is normal.    ED Course  Procedures (including critical care time)  Labs Reviewed  GLUCOSE, CAPILLARY - Abnormal; Notable for the following:    Glucose-Capillary 168 (*)     All other components within normal limits   No results found.   No diagnosis found.    MDM  I have reviewed nursing notes, vital signs, and all appropriate lab and imaging results for this patient. UA non-acute. No signs of kidney stone or UTI or prostatitis. L spine xray is neg for fx or change in hardware in the back. Multiple areas of DDD, and arthritis. No pulsatile mass, change in extremity temp or color , or changes is DP and PT pulses that would suggest aneurysm. Plan to add pain medication (norco 7.5)  to current medications. Pt to follow up with PCP and neurosurgeon       Kathie Dike, PA 07/04/11 1012

## 2011-07-04 NOTE — ED Provider Notes (Signed)
Pt seen with PA He is for back pain, reports similar to prior episodes of pain He has no focal neuro deficits He is well appearing and in no distress Doubt acute neurologic/vascular process BP 153/88  Pulse 80  Temp 97.9 F (36.6 C) (Oral)  Resp 16  Ht 6\' 1"  (1.854 m)  Wt 164 lb (74.39 kg)  BMI 21.64 kg/m2  SpO2 98%   Joya Gaskins, MD 07/04/11 [phone]

## 2011-07-04 NOTE — ED Notes (Signed)
Pt states has a ride home. Nad. Taken to [employer]

## 2011-08-06 ENCOUNTER — Emergency Department (HOSPITAL_COMMUNITY): Payer: Medicare Other

## 2011-08-06 ENCOUNTER — Emergency Department (HOSPITAL_COMMUNITY)
Admission: EM | Admit: 2011-08-06 | Discharge: 2011-08-06 | Disposition: A | Discharge status: Home / Self Care | Payer: Medicare Other | Attending: Emergency Medicine | Admitting: Emergency Medicine

## 2011-08-06 ENCOUNTER — Encounter (HOSPITAL_COMMUNITY): Payer: Self-pay | Admitting: Emergency Medicine

## 2011-08-06 DIAGNOSIS — K219 Gastro-esophageal reflux disease without esophagitis: Secondary | ICD-10-CM | POA: Insufficient documentation

## 2011-08-06 DIAGNOSIS — E119 Type 2 diabetes mellitus without complications: Secondary | ICD-10-CM | POA: Insufficient documentation

## 2011-08-06 DIAGNOSIS — T63461A Toxic effect of venom of wasps, accidental (unintentional), initial encounter: Secondary | ICD-10-CM | POA: Insufficient documentation

## 2011-08-06 DIAGNOSIS — L299 Pruritus, unspecified: Secondary | ICD-10-CM | POA: Diagnosis not present

## 2011-08-06 DIAGNOSIS — F17200 Nicotine dependence, unspecified, uncomplicated: Secondary | ICD-10-CM | POA: Insufficient documentation

## 2011-08-06 DIAGNOSIS — Z794 Long term (current) use of insulin: Secondary | ICD-10-CM | POA: Insufficient documentation

## 2011-08-06 DIAGNOSIS — Z79899 Other long term (current) drug therapy: Secondary | ICD-10-CM | POA: Insufficient documentation

## 2011-08-06 DIAGNOSIS — T7840XA Allergy, unspecified, initial encounter: Secondary | ICD-10-CM

## 2011-08-06 DIAGNOSIS — L509 Urticaria, unspecified: Secondary | ICD-10-CM | POA: Diagnosis not present

## 2011-08-06 DIAGNOSIS — L500 Allergic urticaria: Secondary | ICD-10-CM | POA: Diagnosis not present

## 2011-08-06 DIAGNOSIS — T6391XA Toxic effect of contact with unspecified venomous animal, accidental (unintentional), initial encounter: Secondary | ICD-10-CM | POA: Diagnosis not present

## 2011-08-06 DIAGNOSIS — Z7982 Long term (current) use of aspirin: Secondary | ICD-10-CM | POA: Diagnosis not present

## 2011-08-06 DIAGNOSIS — Z0389 Encounter for observation for other suspected diseases and conditions ruled out: Secondary | ICD-10-CM | POA: Diagnosis not present

## 2011-08-06 LAB — GLUCOSE, CAPILLARY: Glucose-Capillary: 137 mg/dL — ABNORMAL HIGH (ref 70–99)

## 2011-08-06 MED ORDER — METHYLPREDNISOLONE SODIUM SUCC 125 MG IJ SOLR
125.0000 mg | Freq: Once | INTRAMUSCULAR | Status: AC
  Administered 2011-08-06: 125 mg via INTRAVENOUS
  Filled 2011-08-06: qty 2

## 2011-08-06 MED ORDER — DIPHENHYDRAMINE HCL 50 MG/ML IJ SOLN
25.0000 mg | Freq: Once | INTRAMUSCULAR | Status: AC
  Administered 2011-08-06: 09:00:00 via INTRAVENOUS
  Filled 2011-08-06: qty 1

## 2011-08-06 MED ORDER — FAMOTIDINE IN NACL 20-0.9 MG/50ML-% IV SOLN
20.0000 mg | Freq: Once | INTRAVENOUS | Status: AC
  Administered 2011-08-06: 20 mg via INTRAVENOUS
  Filled 2011-08-06: qty 50

## 2011-08-06 MED ORDER — PREDNISONE 50 MG PO TABS: ORAL_TABLET | ORAL | Status: AC

## 2011-08-06 MED ORDER — DIPHENHYDRAMINE HCL 25 MG PO TABS: 25.0000 mg | ORAL_TABLET | Freq: Four times a day (QID) | ORAL | Status: DC

## 2011-08-06 MED ORDER — SODIUM CHLORIDE 0.9 % IV BOLUS (SEPSIS)
1000.0000 mL | Freq: Once | INTRAVENOUS | Status: AC
  Administered 2011-08-06: 1000 mL via INTRAVENOUS

## 2011-08-06 MED ORDER — EPINEPHRINE 0.3 MG/0.3ML IJ DEVI: 0.3000 mg | INTRAMUSCULAR | Status: DC | PRN

## 2011-08-06 MED ORDER — ALBUTEROL SULFATE (5 MG/ML) 0.5% IN NEBU
2.5000 mg | INHALATION_SOLUTION | Freq: Once | RESPIRATORY_TRACT | Status: AC
  Administered 2011-08-06: 2.5 mg via RESPIRATORY_TRACT
  Filled 2011-08-06: qty 0.5

## 2011-08-06 NOTE — ED Notes (Signed)
Whelps have decreased. Pt states that his itching is better. IV infusing with no edema or redness.

## 2011-08-06 NOTE — ED Provider Notes (Signed)
History  This chart was scribed for Glynn Octave, MD by Ladona Ridgel Day. This patient was seen in room APA03/APA03 and the patient's care was started at 0828.  CSN: [id number]  Arrival date & time 08/06/11  4098   First MD Initiated Contact with Patient 08/06/11 [phone]      Chief Complaint  Patient presents with  . Insect Bite    The history is provided by the patient. No language interpreter was used.   Allen Yu is a 66 y.o. male who presents to the Emergency Department after he was stung by a bee on his left forearm about 30 minutes ago and now states that he is itching all over. He currently denies any SOB, chest pain, and trouble swallowing at this time. He denies taking any medications at home for his symptoms. He also states that he hasn't been stung by a bee in 18 years. He is a current everyday smoker.  He is followed by his PCP Dr. Jamey Reas  Past Medical History  Diagnosis Date  . Diabetes mellitus   . Chronic kidney disease     stones hx  . PTSD (post-traumatic stress disorder)   . GERD (gastroesophageal reflux disease)     Past Surgical History  Procedure Date  . Hernia repair rt ing  . Bladder surgery     02  . Back surgery     x2  . Appendectomy   . Cholecystectomy     History reviewed. No pertinent family history.  History  Substance Use Topics  . Smoking status: Current Everyday Smoker -- 1.0 packs/day    Types: Cigarettes  . Smokeless tobacco: Not on file  . Alcohol Use: No      Review of Systems  Constitutional: Negative for fever and chills.  HENT: Negative for congestion, sore throat, facial swelling and trouble swallowing.   Eyes: Negative for pain and redness.  Respiratory: Negative for cough, choking, chest tightness and shortness of breath.   Cardiovascular: Negative for chest pain and leg swelling.  Gastrointestinal: Negative for nausea, vomiting and abdominal pain.  Musculoskeletal: Negative for back pain.  Skin: Negative for color  change and pallor.       He states he feels itchy all over his body.   Neurological: Negative for syncope.  All other systems reviewed and are negative.    Allergies  Bee venom; Codeine; and Diovan  Home Medications   Current Outpatient Rx  Name Route Sig Dispense Refill  . ASPIRIN EC 81 MG PO TBEC Oral Take 81 mg by mouth daily.      . ATORVASTATIN CALCIUM 20 MG PO TABS Oral Take 20 mg by mouth daily.      . INSULIN GLARGINE 100 UNIT/ML [place] SOLN Subcutaneous Inject 80 Units into the skin at bedtime.    . INSULIN LISPRO (HUMAN) 100 UNIT/ML [place] SOLN Subcutaneous Inject 10 Units into the skin 3 (three) times daily before meals.      [name] LOSARTAN POTASSIUM 50 MG PO TABS Oral Take 50 mg by mouth daily.      [name] OMEPRAZOLE 20 MG PO CPDR Oral Take 20 mg by mouth daily.    [name] TAMSULOSIN HCL 0.4 MG PO CAPS Oral Take 0.4 mg by mouth daily.        Triage Vitals: BP 105/98  Pulse 96  Temp 97.8 F (36.6 C) (Oral)  Resp 20  Ht 6\' 1"  (1.854 m)  Wt 160 lb (72.576 kg)  BMI 21.11 kg/m2  SpO2  100%  Physical Exam  Nursing note and vitals reviewed. Constitutional: He is oriented to person, place, and time. He appears well-developed and well-nourished. No distress.  HENT:  Head: Normocephalic and atraumatic.       No pharyngeal edema, no tongue elevation.  Eyes: Conjunctivae and EOM are normal.  Neck: Neck supple. No tracheal deviation present.  Cardiovascular: Normal rate, regular rhythm and normal heart sounds.   Pulmonary/Chest: Effort normal and breath sounds normal. No respiratory distress. He has no wheezes.  Abdominal: Soft.  Musculoskeletal: Normal range of motion.  Neurological: He is alert and oriented to person, place, and time.  Skin: Skin is warm and dry.       Diffuse urticaria.   Psychiatric: He has a normal mood and affect. His behavior is normal.    ED Course  Procedures (including critical care time) DIAGNOSTIC STUDIES: Oxygen Saturation is 100% on room air, normal by my  interpretation.    COORDINATION OF CARE: At 851 AM Discussed treatment plan with patient which includes IV fluids, breathing treatment, and CXR. Patient agrees.  Labs Reviewed  GLUCOSE, CAPILLARY - Abnormal; Notable for the following:    Glucose-Capillary 137 (*)     All other components within normal limits   Dg Chest Portable 1 View  08/06/2011  *RADIOLOGY REPORT*  Clinical Data: Allergic reaction.  Hives.  PORTABLE CHEST - 1 VIEW  Comparison: 01/31/2011.  Findings: Trachea is midline.  Heart size normal.  Thoracic aorta is calcified.  Lungs appear mildly hyperinflated but clear.  No pleural fluid.  IMPRESSION: No acute findings.  Original Report Authenticated By: Reyes Ivan, M.D.     No diagnosis found.    MDM  Diffuse itchy rash after wasp stings. No chest pain, difficulty breathing, wheezing, difficulty swallowing.  Lungs clear, no oropharyngeal edema, tongue and uvula midline, no tongue elevation, handling secretions.  Resolution of hives and treatment. Lungs clear, no wheezing. Patient observed for one hour in the ED with no deterioration in condition.  We'll prescribe prednisone, patient cautioned about possible blood sugar elevation with this medication, Benadryl and followup with PCP. Return precautions discussed.   Date: 08/06/2011  Rate: 85  Rhythm: normal sinus rhythm  QRS Axis: normal  Intervals: normal  ST/T Wave abnormalities: normal  Conduction Disutrbances:none  Narrative Interpretation:   Old EKG Reviewed: unchanged  I personally performed the services described in this documentation, which was scribed in my presence.  The recorded information has been reviewed and considered.        Glynn Octave, MD 08/06/11 1108

## 2011-08-06 NOTE — ED Notes (Signed)
Pt c/o wasp sting to the left forearm and rt side of head. Pt is c/o of itching and denies any sob or trouble swallowing.

## 2011-08-06 NOTE — ED Notes (Signed)
Pt c/o being stung by a wasp x 2. Pt has raised whelps noted over entire upper torso with itching. Pt alert and oriented x 3. Skin warm and dry. Color pink. Breath sounds clear and equal bilaterally. Pt denies difficulty breathing or swallowing.

## 2011-08-06 NOTE — ED Notes (Signed)
Pt is currently awaiting his discharge paperwork.

## 2011-09-10 DIAGNOSIS — F29 Unspecified psychosis not due to a substance or known physiological condition: Secondary | ICD-10-CM | POA: Insufficient documentation

## 2011-09-10 DIAGNOSIS — M5136 Other intervertebral disc degeneration, lumbar region: Secondary | ICD-10-CM | POA: Insufficient documentation

## 2011-09-10 DIAGNOSIS — E1140 Type 2 diabetes mellitus with diabetic neuropathy, unspecified: Secondary | ICD-10-CM | POA: Insufficient documentation

## 2011-09-10 DIAGNOSIS — I10 Essential (primary) hypertension: Secondary | ICD-10-CM | POA: Diagnosis present

## 2011-09-10 DIAGNOSIS — E785 Hyperlipidemia, unspecified: Secondary | ICD-10-CM | POA: Insufficient documentation

## 2011-09-10 DIAGNOSIS — D638 Anemia in other chronic diseases classified elsewhere: Secondary | ICD-10-CM | POA: Diagnosis present

## 2011-09-10 DIAGNOSIS — R809 Proteinuria, unspecified: Secondary | ICD-10-CM | POA: Insufficient documentation

## 2011-09-10 DIAGNOSIS — D649 Anemia, unspecified: Secondary | ICD-10-CM | POA: Diagnosis present

## 2011-09-10 DIAGNOSIS — M51369 Other intervertebral disc degeneration, lumbar region without mention of lumbar back pain or lower extremity pain: Secondary | ICD-10-CM | POA: Insufficient documentation

## 2011-09-10 DIAGNOSIS — E1165 Type 2 diabetes mellitus with hyperglycemia: Secondary | ICD-10-CM | POA: Insufficient documentation

## 2011-09-11 DIAGNOSIS — M542 Cervicalgia: Secondary | ICD-10-CM | POA: Diagnosis not present

## 2011-10-01 DIAGNOSIS — N400 Enlarged prostate without lower urinary tract symptoms: Secondary | ICD-10-CM | POA: Diagnosis not present

## 2012-01-03 DIAGNOSIS — S91309A Unspecified open wound, unspecified foot, initial encounter: Secondary | ICD-10-CM | POA: Diagnosis not present

## 2012-01-29 DIAGNOSIS — Z79899 Other long term (current) drug therapy: Secondary | ICD-10-CM | POA: Diagnosis not present

## 2012-01-29 DIAGNOSIS — E785 Hyperlipidemia, unspecified: Secondary | ICD-10-CM | POA: Diagnosis not present

## 2012-01-29 DIAGNOSIS — E119 Type 2 diabetes mellitus without complications: Secondary | ICD-10-CM | POA: Diagnosis not present

## 2012-02-13 DIAGNOSIS — E119 Type 2 diabetes mellitus without complications: Secondary | ICD-10-CM | POA: Diagnosis not present

## 2012-02-13 DIAGNOSIS — E785 Hyperlipidemia, unspecified: Secondary | ICD-10-CM | POA: Diagnosis not present

## 2012-02-13 DIAGNOSIS — I10 Essential (primary) hypertension: Secondary | ICD-10-CM | POA: Diagnosis not present

## 2012-02-26 DIAGNOSIS — E1149 Type 2 diabetes mellitus with other diabetic neurological complication: Secondary | ICD-10-CM | POA: Diagnosis not present

## 2012-02-26 DIAGNOSIS — E119 Type 2 diabetes mellitus without complications: Secondary | ICD-10-CM | POA: Diagnosis not present

## 2012-05-06 DIAGNOSIS — E1149 Type 2 diabetes mellitus with other diabetic neurological complication: Secondary | ICD-10-CM | POA: Diagnosis not present

## 2012-05-06 DIAGNOSIS — E119 Type 2 diabetes mellitus without complications: Secondary | ICD-10-CM | POA: Diagnosis not present

## 2012-05-19 ENCOUNTER — Encounter (HOSPITAL_COMMUNITY): Payer: Self-pay

## 2012-05-19 ENCOUNTER — Emergency Department (HOSPITAL_COMMUNITY): Payer: Medicare Other

## 2012-05-19 ENCOUNTER — Emergency Department (HOSPITAL_COMMUNITY)
Admission: EM | Admit: 2012-05-19 | Discharge: 2012-05-19 | Disposition: A | Discharge status: Home / Self Care | Payer: Medicare Other | Attending: Emergency Medicine | Admitting: Emergency Medicine

## 2012-05-19 DIAGNOSIS — K219 Gastro-esophageal reflux disease without esophagitis: Secondary | ICD-10-CM | POA: Insufficient documentation

## 2012-05-19 DIAGNOSIS — S199XXA Unspecified injury of neck, initial encounter: Secondary | ICD-10-CM | POA: Diagnosis not present

## 2012-05-19 DIAGNOSIS — S0993XA Unspecified injury of face, initial encounter: Secondary | ICD-10-CM | POA: Diagnosis not present

## 2012-05-19 DIAGNOSIS — Y9389 Activity, other specified: Secondary | ICD-10-CM | POA: Insufficient documentation

## 2012-05-19 DIAGNOSIS — S4980XA Other specified injuries of shoulder and upper arm, unspecified arm, initial encounter: Secondary | ICD-10-CM | POA: Insufficient documentation

## 2012-05-19 DIAGNOSIS — Z7982 Long term (current) use of aspirin: Secondary | ICD-10-CM | POA: Insufficient documentation

## 2012-05-19 DIAGNOSIS — Z8659 Personal history of other mental and behavioral disorders: Secondary | ICD-10-CM | POA: Insufficient documentation

## 2012-05-19 DIAGNOSIS — Z794 Long term (current) use of insulin: Secondary | ICD-10-CM | POA: Diagnosis not present

## 2012-05-19 DIAGNOSIS — M549 Dorsalgia, unspecified: Secondary | ICD-10-CM | POA: Insufficient documentation

## 2012-05-19 DIAGNOSIS — G8929 Other chronic pain: Secondary | ICD-10-CM | POA: Diagnosis not present

## 2012-05-19 DIAGNOSIS — S20219A Contusion of unspecified front wall of thorax, initial encounter: Secondary | ICD-10-CM | POA: Insufficient documentation

## 2012-05-19 DIAGNOSIS — E119 Type 2 diabetes mellitus without complications: Secondary | ICD-10-CM | POA: Diagnosis not present

## 2012-05-19 DIAGNOSIS — F17200 Nicotine dependence, unspecified, uncomplicated: Secondary | ICD-10-CM | POA: Insufficient documentation

## 2012-05-19 DIAGNOSIS — Y92009 Unspecified place in unspecified non-institutional (private) residence as the place of occurrence of the external cause: Secondary | ICD-10-CM | POA: Insufficient documentation

## 2012-05-19 DIAGNOSIS — M25519 Pain in unspecified shoulder: Secondary | ICD-10-CM | POA: Diagnosis not present

## 2012-05-19 DIAGNOSIS — S0990XA Unspecified injury of head, initial encounter: Secondary | ICD-10-CM | POA: Diagnosis not present

## 2012-05-19 DIAGNOSIS — N189 Chronic kidney disease, unspecified: Secondary | ICD-10-CM | POA: Insufficient documentation

## 2012-05-19 DIAGNOSIS — M47812 Spondylosis without myelopathy or radiculopathy, cervical region: Secondary | ICD-10-CM | POA: Diagnosis not present

## 2012-05-19 DIAGNOSIS — S20211A Contusion of right front wall of thorax, initial encounter: Secondary | ICD-10-CM

## 2012-05-19 DIAGNOSIS — Z79899 Other long term (current) drug therapy: Secondary | ICD-10-CM | POA: Insufficient documentation

## 2012-05-19 DIAGNOSIS — W010XXA Fall on same level from slipping, tripping and stumbling without subsequent striking against object, initial encounter: Secondary | ICD-10-CM | POA: Insufficient documentation

## 2012-05-19 DIAGNOSIS — R079 Chest pain, unspecified: Secondary | ICD-10-CM | POA: Diagnosis not present

## 2012-05-19 DIAGNOSIS — S298XXA Other specified injuries of thorax, initial encounter: Secondary | ICD-10-CM | POA: Diagnosis not present

## 2012-05-19 DIAGNOSIS — S46909A Unspecified injury of unspecified muscle, fascia and tendon at shoulder and upper arm level, unspecified arm, initial encounter: Secondary | ICD-10-CM | POA: Insufficient documentation

## 2012-05-19 DIAGNOSIS — W19XXXA Unspecified fall, initial encounter: Secondary | ICD-10-CM

## 2012-05-19 MED ORDER — HYDROCODONE-ACETAMINOPHEN 5-325 MG PO TABS
1.0000 | ORAL_TABLET | Freq: Once | ORAL | Status: AC
  Administered 2012-05-19: 1 via ORAL
  Filled 2012-05-19: qty 1

## 2012-05-19 MED ORDER — HYDROCODONE-ACETAMINOPHEN 5-325 MG PO TABS: 1.0000 | ORAL_TABLET | ORAL | Status: DC | PRN

## 2012-05-19 NOTE — ED Notes (Signed)
Pt tripped and fell on Saturday, injured right side of body and right shoulder. Denies loc

## 2012-05-19 NOTE — ED Provider Notes (Signed)
History  This chart was scribed for Allen Booze, MD by Bennett Scrape, ED Scribe. This patient was seen in room APA08/APA08 and the patient's care was started at 12:00 PM.  CSN: [id number]  Arrival date & time 05/19/12  1029   First MD Initiated Contact with Patient 05/19/12 1200      Chief Complaint  Patient presents with  . Fall     The history is provided by the patient. No language interpreter was used.     HPI Comments: Allen Yu is a 67 y.o. male who presents to the Emergency Department complaining of a trip and fall outside his home 2 days ago. He c/o right shoulder pain, right neck pain and right lateral anterior rib pain currently. He rates his pain an 8 out of 10. He denies head injury or LOC. He states that he has been taking "pain pills" prescribed by his PCP for his "bad back" with no improvement. He has a h/o DM and CKD. Pt is a current everyday smoker but denies alcohol use.  PCP is Dr. Ouida Sills  Past Medical History  Diagnosis Date  . Diabetes mellitus   . Chronic kidney disease     stones hx  . PTSD (post-traumatic stress disorder)   . GERD (gastroesophageal reflux disease)     Past Surgical History  Procedure Laterality Date  . Hernia repair  rt ing  . Bladder surgery      02  . Back surgery      x2  . Appendectomy    . Cholecystectomy      No family history on file.  History  Substance Use Topics  . Smoking status: Current Every Day Smoker -- 1.00 packs/day    Types: Cigarettes  . Smokeless tobacco: Not on file  . Alcohol Use: No      Review of Systems  HENT: Positive for neck pain (right).   Cardiovascular: Positive for chest pain (right).  Musculoskeletal: Positive for back pain (chronic) and arthralgias (shoulder pain).  Skin: Negative for wound.  Neurological: Negative for syncope.  All other systems reviewed and are negative.    Allergies  Bee venom; Codeine; and Diovan  Home Medications   Current Outpatient Rx  Name   Route  Sig  Dispense  Refill  . aspirin EC 81 MG tablet   Oral   Take 81 mg by mouth daily.           [name] atorvastatin (LIPITOR) 20 MG tablet   Oral   Take 20 mg by mouth daily.           [name] EPINEPHrine (EPIPEN) 0.3 mg/0.3 mL DEVI   Intramuscular   Inject 0.3 mLs (0.3 mg total) into the muscle as needed.   1 Device   0   . insulin glargine (LANTUS) 100 UNIT/ML injection   Subcutaneous   Inject 80 Units into the skin at bedtime.         . insulin lispro (HUMALOG) 100 UNIT/ML injection   Subcutaneous   Inject 10 Units into the skin 3 (three) times daily before meals.           [name] losartan (COZAAR) 50 MG tablet   Oral   Take 50 mg by mouth daily.           [name] omeprazole (PRILOSEC) 20 MG capsule   Oral   Take 20 mg by mouth daily.         . Tamsulosin HCl (FLOMAX) 0.4 MG  CAPS   Oral   Take 0.4 mg by mouth daily.             Triage Vitals: BP 120/68  Pulse 90  Temp(Src) 98.6 F (37 C) (Oral)  Resp 18  Ht 6\' 1"  (1.854 m)  Wt 170 lb (77.111 kg)  BMI 22.43 kg/m2  SpO2 100%  Physical Exam  Nursing note and vitals reviewed. Constitutional: He is oriented to person, place, and time. He appears well-developed and well-nourished. No distress.  HENT:  Head: Normocephalic and atraumatic.  Eyes: Conjunctivae and EOM are normal.  Neck: Neck supple. No tracheal deviation present.  Cardiovascular: Normal rate and regular rhythm.   Pulmonary/Chest: Effort normal and breath sounds normal. No respiratory distress. He exhibits tenderness (moderate tenderness of right lateral chest wall).  Abdominal: Soft. There is no tenderness.  Musculoskeletal: Normal range of motion.  Full ROM of the right shoulder, no deformities   Neurological: He is alert and oriented to person, place, and time.  Skin: Skin is warm and dry.  Psychiatric: He has a normal mood and affect. His behavior is normal.    ED Course  Procedures (including critical care time)  Medications   HYDROcodone-acetaminophen (NORCO/VICODIN) 5-325 MG per tablet 1 tablet (not administered)    DIAGNOSTIC STUDIES: Oxygen Saturation is 100% on room air, normal by my interpretation.    COORDINATION OF CARE: 12:04 PM-Discussed treatment plan which includes CT of c-spine, xray of right shoulder, xray of right lateral chest and CT of head with pt at bedside and pt agreed to plan.   Labs Reviewed - No data to display Dg Ribs Unilateral W/chest Right  05/19/2012  *RADIOLOGY REPORT*  Clinical Data: Fall 2 days ago.  Pain on the right.  RIGHT RIBS AND CHEST - 3+ VIEW  Comparison: 08/06/2011.  Findings: No contusion, effusion or pneumothorax.  The lungs are clear.  The heart and mediastinal structures appear normal.  There are no acute bony abnormalities.  Deformities of the right fourth and fifth ribs were present previously and are old.  Previous cervical spine surgery.  IMPRESSION: No acute trauma to the thorax.  No acute or active cardiopulmonary disease.   Original Report Authenticated By: [name], M.D.    Dg Shoulder Right  05/19/2012  *RADIOLOGY REPORT*  Clinical Data: Fall with pain on the right side.  Previous shoulder surgery.  RIGHT SHOULDER - 2+ VIEW  Comparison: Shoulder MRI 02/25/2006  Findings: There is widening of the right AC joint which is likely postoperative in etiology.  Right shoulder is located without acute fracture.  There are old fractures involving the right third and fourth ribs.  IMPRESSION: No acute bony abnormality to the right shoulder.  Suspected postsurgical changes as described.   Original Report Authenticated By: [name], M.D.    Ct Head Wo Contrast  05/19/2012  *RADIOLOGY REPORT*  Clinical Data: Tip of fell on Saturday.  CT HEAD WITHOUT CONTRAST  Technique:  Contiguous axial images were obtained from the base of the skull through the vertex without contrast.  Comparison: 03/09/2009  Findings: No acute trauma to the skull brain.  Mild atrophy and small vessel  white matter ischemic change.  No hemorrhage, infarction, subdural hematoma or shift to the midline structures. The brain, ventricles and extra-axial spaces are otherwise normal. The bony calvarium is intact.  Orbits and contents, mastoid air cells and paranasal sinuses appear normal.  IMPRESSION: No acute trauma.  Mild atrophy and small vessel white matter ischemic change.  Original Report Authenticated By: [name], M.D.    Ct Cervical Spine Wo Contrast  05/19/2012  *RADIOLOGY REPORT*  Clinical Data: Fall.  Trauma to the neck.  CT CERVICAL SPINE WITHOUT CONTRAST  Technique:  Multidetector CT imaging of the cervical spine was performed. Multiplanar CT image reconstructions were also generated.  Comparison: MRI 10/03/2004  Findings: There has been previous anterior cervical discectomy and fusion from C4-C7.  Fusion appears solid.  There is an anterior plate with screw fixation.  The canal and foramina appear sufficiently patent in the fusion region.  There is ordinary osteoarthritis of the C1-2 articulation.  C2-3 shows facet degeneration bilaterally without slippage.  C3-4 shows advanced facet arthropathy on the right and moderate facet arthropathy on the left.  There is 1 mm anterolisthesis.  There is foraminal narrowing on the right because of the facet arthropathy. C7-C1 shows mild facet degeneration and bulging of the disc but no acute or traumatic finding.  Lung apices show emphysema and scarring.  IMPRESSION: No acute or traumatic finding.  Previous ACDF C4-C7.  Degenerative changes above and below as outlined above, most notable at the C3-4 level with advanced facet arthropathy right more than left and foraminal narrowing on the right.   Original Report Authenticated By: Paulina Fusi, M.D.      1. Fall at home, initial encounter   2. Contusion, chest wall, right, initial encounter       MDM  Fall with major injury. Be a chest wall contusion. You will be sent for x-rays to rule out  fracture. Also, given mechanism of fall, he will be sent for CT of head and cervical spine.  CT and x-rays are unremarkable. He is discharged with prescription for hydrocodone-acetaminophen.   I personally performed the services described in this documentation, which was scribed in my presence. The recorded information has been reviewed and is accurate.     Allen Booze, MD 05/19/12 1331

## 2012-05-19 NOTE — ED Notes (Signed)
Patient states he left his glasses in radiology. Radiology contacted and will look for them.

## 2012-06-11 DIAGNOSIS — E119 Type 2 diabetes mellitus without complications: Secondary | ICD-10-CM | POA: Diagnosis not present

## 2012-07-01 DIAGNOSIS — E119 Type 2 diabetes mellitus without complications: Secondary | ICD-10-CM | POA: Diagnosis not present

## 2012-08-06 DIAGNOSIS — E119 Type 2 diabetes mellitus without complications: Secondary | ICD-10-CM | POA: Diagnosis not present

## 2012-08-06 DIAGNOSIS — E1149 Type 2 diabetes mellitus with other diabetic neurological complication: Secondary | ICD-10-CM | POA: Diagnosis not present

## 2012-08-26 DIAGNOSIS — N400 Enlarged prostate without lower urinary tract symptoms: Secondary | ICD-10-CM | POA: Diagnosis not present

## 2012-09-02 DIAGNOSIS — C679 Malignant neoplasm of bladder, unspecified: Secondary | ICD-10-CM | POA: Diagnosis not present

## 2012-09-02 DIAGNOSIS — N400 Enlarged prostate without lower urinary tract symptoms: Secondary | ICD-10-CM | POA: Diagnosis not present

## 2012-11-17 ENCOUNTER — Inpatient Hospital Stay (HOSPITAL_COMMUNITY): Payer: Medicare Other

## 2012-11-17 ENCOUNTER — Emergency Department (HOSPITAL_COMMUNITY): Payer: Medicare Other

## 2012-11-17 ENCOUNTER — Encounter (HOSPITAL_COMMUNITY): Payer: Self-pay | Admitting: Emergency Medicine

## 2012-11-17 ENCOUNTER — Other Ambulatory Visit (HOSPITAL_COMMUNITY): Payer: Medicare Other

## 2012-11-17 ENCOUNTER — Inpatient Hospital Stay (HOSPITAL_COMMUNITY)
Admission: EM | Admit: 2012-11-17 | Discharge: 2012-11-19 | DRG: 639 | Disposition: A | Discharge status: Home / Self Care | Payer: Medicare Other | Attending: Internal Medicine | Admitting: Internal Medicine

## 2012-11-17 DIAGNOSIS — N189 Chronic kidney disease, unspecified: Secondary | ICD-10-CM | POA: Diagnosis not present

## 2012-11-17 DIAGNOSIS — D638 Anemia in other chronic diseases classified elsewhere: Secondary | ICD-10-CM | POA: Diagnosis present

## 2012-11-17 DIAGNOSIS — F4312 Post-traumatic stress disorder, chronic: Secondary | ICD-10-CM | POA: Insufficient documentation

## 2012-11-17 DIAGNOSIS — G459 Transient cerebral ischemic attack, unspecified: Secondary | ICD-10-CM | POA: Diagnosis not present

## 2012-11-17 DIAGNOSIS — R4182 Altered mental status, unspecified: Secondary | ICD-10-CM

## 2012-11-17 DIAGNOSIS — N183 Chronic kidney disease, stage 3 unspecified: Secondary | ICD-10-CM | POA: Diagnosis present

## 2012-11-17 DIAGNOSIS — R680 Hypothermia, not associated with low environmental temperature: Secondary | ICD-10-CM | POA: Diagnosis present

## 2012-11-17 DIAGNOSIS — E876 Hypokalemia: Secondary | ICD-10-CM | POA: Diagnosis present

## 2012-11-17 DIAGNOSIS — I658 Occlusion and stenosis of other precerebral arteries: Secondary | ICD-10-CM | POA: Diagnosis not present

## 2012-11-17 DIAGNOSIS — Z794 Long term (current) use of insulin: Secondary | ICD-10-CM

## 2012-11-17 DIAGNOSIS — F17200 Nicotine dependence, unspecified, uncomplicated: Secondary | ICD-10-CM | POA: Diagnosis present

## 2012-11-17 DIAGNOSIS — I517 Cardiomegaly: Secondary | ICD-10-CM | POA: Diagnosis not present

## 2012-11-17 DIAGNOSIS — E1169 Type 2 diabetes mellitus with other specified complication: Secondary | ICD-10-CM | POA: Diagnosis present

## 2012-11-17 DIAGNOSIS — N182 Chronic kidney disease, stage 2 (mild): Secondary | ICD-10-CM | POA: Diagnosis present

## 2012-11-17 DIAGNOSIS — IMO0002 Reserved for concepts with insufficient information to code with codable children: Secondary | ICD-10-CM | POA: Diagnosis present

## 2012-11-17 DIAGNOSIS — N400 Enlarged prostate without lower urinary tract symptoms: Secondary | ICD-10-CM | POA: Diagnosis present

## 2012-11-17 DIAGNOSIS — E119 Type 2 diabetes mellitus without complications: Secondary | ICD-10-CM | POA: Diagnosis not present

## 2012-11-17 DIAGNOSIS — I129 Hypertensive chronic kidney disease with stage 1 through stage 4 chronic kidney disease, or unspecified chronic kidney disease: Secondary | ICD-10-CM | POA: Diagnosis present

## 2012-11-17 DIAGNOSIS — R55 Syncope and collapse: Secondary | ICD-10-CM | POA: Diagnosis not present

## 2012-11-17 DIAGNOSIS — I6350 Cerebral infarction due to unspecified occlusion or stenosis of unspecified cerebral artery: Secondary | ICD-10-CM | POA: Diagnosis not present

## 2012-11-17 DIAGNOSIS — T68XXXA Hypothermia, initial encounter: Secondary | ICD-10-CM | POA: Diagnosis not present

## 2012-11-17 DIAGNOSIS — D72829 Elevated white blood cell count, unspecified: Secondary | ICD-10-CM | POA: Diagnosis present

## 2012-11-17 DIAGNOSIS — K219 Gastro-esophageal reflux disease without esophagitis: Secondary | ICD-10-CM | POA: Diagnosis not present

## 2012-11-17 DIAGNOSIS — F4310 Post-traumatic stress disorder, unspecified: Secondary | ICD-10-CM | POA: Diagnosis present

## 2012-11-17 DIAGNOSIS — I672 Cerebral atherosclerosis: Secondary | ICD-10-CM | POA: Diagnosis not present

## 2012-11-17 DIAGNOSIS — R569 Unspecified convulsions: Secondary | ICD-10-CM | POA: Diagnosis not present

## 2012-11-17 DIAGNOSIS — R9389 Abnormal findings on diagnostic imaging of other specified body structures: Secondary | ICD-10-CM | POA: Diagnosis not present

## 2012-11-17 HISTORY — DX: Dorsalgia, unspecified: M54.9

## 2012-11-17 HISTORY — DX: Other chronic pain: G89.29

## 2012-11-17 LAB — CBC WITH DIFFERENTIAL/PLATELET
Basophils Relative: 0 % (ref 0–1)
Eosinophils Absolute: 0 10*3/uL (ref 0.0–0.7)
Eosinophils Relative: 1 % (ref 0–5)
HCT: 33.2 % — ABNORMAL LOW (ref 39.0–52.0)
Hemoglobin: 11.3 g/dL — ABNORMAL LOW (ref 13.0–17.0)
MCH: 31.7 pg (ref 26.0–34.0)
MCHC: 34 g/dL (ref 30.0–36.0)
MCV: 93 fL (ref 78.0–100.0)
Monocytes Absolute: 0.5 10*3/uL (ref 0.1–1.0)
Monocytes Relative: 6 % (ref 3–12)
RDW: 14.4 % (ref 11.5–15.5)
WBC: 8.7 10*3/uL (ref 4.0–10.5)

## 2012-11-17 LAB — LIPID PANEL
HDL: 55 mg/dL (ref >39)
LDL Cholesterol: 62 mg/dL (ref 0–99)
VLDL: 13 mg/dL (ref 0–40)

## 2012-11-17 LAB — RAPID URINE DRUG SCREEN, HOSP PERFORMED
Amphetamines: NOT DETECTED
Cocaine: NOT DETECTED
Opiates: NOT DETECTED
Tetrahydrocannabinol: POSITIVE — AB

## 2012-11-17 LAB — URINALYSIS, ROUTINE W REFLEX MICROSCOPIC
Glucose, UA: NEGATIVE mg/dL
Leukocytes, UA: NEGATIVE
Specific Gravity, Urine: 1.02 (ref 1.005–1.030)
Urobilinogen, UA: 1 mg/dL (ref 0.0–1.0)

## 2012-11-17 LAB — GLUCOSE, CAPILLARY
Glucose-Capillary: 81 mg/dL (ref 70–99)
Glucose-Capillary: 70 mg/dL (ref 70–99)
Glucose-Capillary: 181 mg/dL — ABNORMAL HIGH (ref 70–99)
Glucose-Capillary: 123 mg/dL — ABNORMAL HIGH (ref 70–99)

## 2012-11-17 LAB — COMPREHENSIVE METABOLIC PANEL
Albumin: 4.4 g/dL (ref 3.5–5.2)
BUN: 15 mg/dL (ref 6–23)
Calcium: 9.7 mg/dL (ref 8.4–10.5)
Chloride: 104 mEq/L (ref 96–112)
Creatinine, Ser: 1.19 mg/dL (ref 0.50–1.35)
Glucose, Bld: 77 mg/dL (ref 70–99)
Total Bilirubin: 0.7 mg/dL (ref 0.3–1.2)
Total Protein: 7.5 g/dL (ref 6.0–8.3)

## 2012-11-17 LAB — URINE MICROSCOPIC-ADD ON

## 2012-11-17 MED ORDER — SENNOSIDES-DOCUSATE SODIUM 8.6-50 MG PO TABS
1.0000 | ORAL_TABLET | Freq: Two times a day (BID) | ORAL | Status: DC
  Administered 2012-11-17 – 2012-11-19 (×5): 1 via ORAL
  Filled 2012-11-17 (×5): qty 1

## 2012-11-17 MED ORDER — TAMSULOSIN HCL 0.4 MG PO CAPS
0.4000 mg | ORAL_CAPSULE | Freq: Every day | ORAL | Status: DC
  Administered 2012-11-17 – 2012-11-19 (×3): 0.4 mg via ORAL
  Filled 2012-11-17 (×3): qty 1

## 2012-11-17 MED ORDER — SODIUM CHLORIDE 0.9 % IV SOLN
INTRAVENOUS | Status: DC
  Administered 2012-11-17: 50 mL/h via INTRAVENOUS
  Administered 2012-11-18: 07:00:00 via INTRAVENOUS

## 2012-11-17 MED ORDER — INSULIN ASPART 100 UNIT/ML ~~LOC~~ SOLN
0.0000 [IU] | Freq: Three times a day (TID) | SUBCUTANEOUS | Status: DC
  Administered 2012-11-17: 2 [IU] via SUBCUTANEOUS
  Administered 2012-11-18: 3 [IU] via SUBCUTANEOUS

## 2012-11-17 MED ORDER — SODIUM CHLORIDE 0.9 % IV SOLN: Freq: Once | INTRAVENOUS | Status: DC

## 2012-11-17 MED ORDER — ASPIRIN 300 MG RE SUPP
300.0000 mg | Freq: Every day | RECTAL | Status: DC
  Filled 2012-11-17 (×3): qty 1

## 2012-11-17 MED ORDER — ENOXAPARIN SODIUM 40 MG/0.4ML ~~LOC~~ SOLN
40.0000 mg | SUBCUTANEOUS | Status: DC
  Administered 2012-11-17 – 2012-11-18 (×2): 40 mg via SUBCUTANEOUS
  Filled 2012-11-17 (×2): qty 0.4

## 2012-11-17 MED ORDER — ASPIRIN 325 MG PO TABS
325.0000 mg | ORAL_TABLET | Freq: Every day | ORAL | Status: DC
  Administered 2012-11-17 – 2012-11-19 (×3): 325 mg via ORAL
  Filled 2012-11-17 (×3): qty 1

## 2012-11-17 MED ORDER — INSULIN GLARGINE 100 UNIT/ML ~~LOC~~ SOLN
20.0000 [IU] | Freq: Every day | SUBCUTANEOUS | Status: DC
  Administered 2012-11-17: 20 [IU] via SUBCUTANEOUS
  Filled 2012-11-17: qty 0.2

## 2012-11-17 MED ORDER — ATORVASTATIN CALCIUM 20 MG PO TABS
20.0000 mg | ORAL_TABLET | Freq: Every day | ORAL | Status: DC
  Administered 2012-11-17 – 2012-11-19 (×3): 20 mg via ORAL
  Filled 2012-11-17 (×3): qty 1

## 2012-11-17 MED ORDER — PANTOPRAZOLE SODIUM 40 MG PO TBEC
40.0000 mg | DELAYED_RELEASE_TABLET | Freq: Every day | ORAL | Status: DC
  Administered 2012-11-17 – 2012-11-19 (×3): 40 mg via ORAL
  Filled 2012-11-17 (×3): qty 1

## 2012-11-17 NOTE — Progress Notes (Addendum)
Notified Dr. Janee Morn that the patients rectal temp was 97.3.  Asked if the patient needed to resume the bair hugger, he said the order can be d/c'd.   Pt tolerated lunch without any know difficulties or complaints.

## 2012-11-17 NOTE — H&P (Signed)
I have seen and assessed patient and agree with Toya Smothers, nurse practitioner's assessment and plan. Patient is a 67 year old gentleman history of diabetes, chronic kidney disease, chronic back pain, gastroesophageal reflux disease that presented to the ED with altered mental status. Was noted per ED physician that patient was last seen on his baseline at 7 AM on the morning of admission. Around 8 AM thereafter patient was noted to be confused with some trembling lips and diaphoresis. It was stated that patient's wife the patient's blood glucose may have been low and patient was given some 2 beat. Patient did not improve symptomatically and he was brought to the ED. In the emergency room he was noted that patient was hypothermic with a temperature of 94, he was placed on the bed however and improved symptomatically. CT of the head which was done was within normal limits. Per ED physician patient was assessed by telemetry neuro who felt this was not a Code stroke and that patient needed to be admitted for close monitoring and further observation. Will admit the patient to telemetry. Stroke workup will be undertaken with MRI of the head, carotid Dopplers, 2-D echo. EEG will also be obtained to rule out seizures. Patient will be pancultured to rule out an infectious etiology. Neurology consultation will also be obtained.

## 2012-11-17 NOTE — H&P (Signed)
[facility] History and Physical  Allen Yu:[id number] DOB: [birth year] DOA: 11/17/2012  Referring physician:  PCP: [name], MD  Specialists:   Chief Complaint: Altered mental status HPI: Allen Yu is a 67 y.o. male with a past medical history that includes diabetes, chronic kidney disease, GERD, chronic back pain presents to the emergency department with chief complaint of altered mental status. Information is obtained from the chart and the patient's wife. Wife reported that the patient awakened around 7 AM this morning and he was at his baseline. Shortly thereafter he became "drenched in sweat" and laid in bed longer than normal. Around 8:00 he got out of bed but she states he appeared confused. She reports that his lips were trembling and she was concerned that his blood sugar was low so she gave him a miniatures nectars bar. He did not improve. He then began trembling all over and stopped responding to her questions and instructions and he needed assistance walking. At this point she was concerned and brought him to the emergency department. She reports no history of severe shaking with prior hypoglycemic episodes. Patient denies any chest pain palpitation headache visual disturbances numbness or tingling of extremities. He denies abdominal pain nausea vomiting dysuria hematuria frequency or urgency. He denies fever chills or recent sick contacts. He does endorse some chronic low back pain for which he has had surgery. He states that his appetite has been waxing and waning lately and that he often does not feel like eating. He reports checking his blood sugar 3 times a day every day. In the emergency room patient was virtually unresponsive with episodes of staring. He was attempting to respond and was unable to. In addition rectal temp  was 94. Neuro telemetry conference obtained and no TPA recommended that didn't recommended admission. Workup in the emergency department  yields a CT of the head without acute intracranial abnormality, EKG with normal sinus rhythm and frequent PACs. Lab work is unremarkable. Symptoms came on suddenly are improving characterized as moderate to severe.   Review of Systems: 10 point review of systems completed all systems are negative except as indicated in the history of present illness  Past Medical History  Diagnosis Date  . Diabetes mellitus   . Chronic kidney disease     stones hx  . PTSD (post-traumatic stress disorder)   . GERD (gastroesophageal reflux disease)   . Chronic back pain    Past Surgical History  Procedure Laterality Date  . Hernia repair  rt ing  . Bladder surgery      02  . Back surgery      x2  . Appendectomy    . Cholecystectomy     Social History:  reports that he has been smoking Cigarettes.  He has been smoking about 1.00 pack per day. He does not have any smokeless tobacco history on file. He reports that he uses illicit drugs (Marijuana). He reports that he does not drink alcohol. Patient lives with his wife. He reports no EtOH use but does smoke marijuana.  Allergies  Allergen Reactions  . Bee Venom Other (See Comments)    Unknown   . Codeine Other (See Comments)    incoherent   . Diovan [Valsartan] Other (See Comments)    incoherent    History reviewed. No pertinent family history. patient's family medical history reviewed and is non-contributory to the admission of this gentleman.  Prior to Admission medications   Medication Sig Start Date End  Date Taking? Authorizing Provider  aspirin EC 81 MG tablet Take 81 mg by mouth daily.     Yes Historical Provider, MD  atorvastatin (LIPITOR) 20 MG tablet Take 20 mg by mouth daily.     Yes Historical Provider, MD  EPINEPHrine (EPIPEN) 0.3 mg/0.3 mL DEVI Inject 0.3 mLs (0.3 mg total) into the muscle as needed. 08/06/11  Yes Glynn Octave, MD  insulin glargine (LANTUS) 100 UNIT/ML injection Inject 80 Units into the skin at bedtime.   Yes  Historical Provider, MD  insulin lispro (HUMALOG) 100 UNIT/ML injection Inject 10 Units into the skin 3 (three) times daily before meals.     Yes Historical Provider, MD  losartan (COZAAR) 50 MG tablet Take 50 mg by mouth daily.     Yes Historical Provider, MD  omeprazole (PRILOSEC) 20 MG capsule Take 20 mg by mouth daily.   Yes Historical Provider, MD  Tamsulosin HCl (FLOMAX) 0.4 MG CAPS Take 0.4 mg by mouth daily.     Yes Historical Provider, MD   Physical Exam: Filed Vitals:   11/17/12 1202  BP: 143/70  Pulse: 80  Temp: 97.4 F (36.3 C)  Resp: 20     General:  Well-nourished alert no acute distress  Eyes: PE RRL, EOMI, no scleral icterus  ENT: Ears clear nose without drainage or oropharynx without erythema or exudate. Mucous membranes of his mouth are slightly dry but pink  Neck: Supple no JVD full range of motion no lymphadenopathy  Cardiovascular: Regular rate and rhythm no murmur no gallop no rub no lower extremity edema  Respiratory: Normal effort breath sounds clear bilaterally to auscultation I hear no wheeze or rhonchi  Abdomen: Round soft positive bowel sounds throughout nontender to palpation no mass organomegaly noted  Skin: Warm and dry I see no rashes or lesions.  Musculoskeletal: Moves all extremities. Joints without swelling/erythema  Psychiatric: Slightly lethargic but cooperative  Neurologic: Cranial nerves II through XII grossly intact. Patient oriented x3. Speech is slow and deliberate but clear. Bilateral grips 5 out of 5  Labs on Admission:  Basic Metabolic Panel:  Recent Labs Lab 11/17/12 0935  NA 142  K 3.5  CL 104  CO2 28  GLUCOSE 77  BUN 15  CREATININE 1.19  CALCIUM 9.7   Liver Function Tests:  Recent Labs Lab 11/17/12 0935  AST 34  ALT 23  ALKPHOS 103  BILITOT 0.7  PROT 7.5  ALBUMIN 4.4   No results found for this basename: LIPASE, AMYLASE,  in the last 168 hours No results found for this basename: AMMONIA,  in the last  168 hours CBC:  Recent Labs Lab 11/17/12 0935  WBC 8.7  NEUTROABS 7.2  HGB 11.3*  HCT 33.2*  MCV 93.0  PLT 236   Cardiac Enzymes:  Recent Labs Lab 11/17/12 0935  TROPONINI <0.30    BNP (last 3 results) No results found for this basename: PROBNP,  in the last 8760 hours CBG:  Recent Labs Lab 11/17/12 0853 11/17/12 1043 11/17/12 1205  GLUCAP 81 70 181*    Radiological Exams on Admission: Ct Head Wo Contrast  11/17/2012   CLINICAL DATA:  Code stroke  EXAM: CT HEAD WITHOUT CONTRAST  TECHNIQUE: Contiguous axial images were obtained from the base of the skull through the vertex without intravenous contrast.  COMPARISON:  05/19/2012  FINDINGS: Motion degraded images.  No evidence of parenchymal hemorrhage or extra-axial fluid collection. No mass lesion, mass effect, or midline shift. No CT evidence of acute infarction.  Subcortical white matter and periventricular small vessel ischemic changes. Intracranial atherosclerosis.  Mild global cortical atrophy. No ventriculomegaly.  The visualized paranasal sinuses are essentially clear. The mastoid air cells are unopacified.  No evidence of calvarial fracture.  IMPRESSION: No evidence of acute intracranial abnormality.  Mild atrophy with small vessel ischemic changes and intracranial atherosclerosis.  These results were called by telephone at the time of interpretation on 11/17/2012 at 9:25 AM to Dr. Bethann Berkshire , who verbally acknowledged these results.   Electronically Signed   By: Charline Bills M.D.   On: 11/17/2012 09:26   Dg Chest Portable 1 View  11/17/2012   CLINICAL DATA:  Altered mental status  EXAM: PORTABLE CHEST - 1 VIEW  COMPARISON:  05/19/2012  FINDINGS: Lungs are essentially clear. No focal consolidation. No pleural effusion or pneumothorax.  The heart is normal in size.  IMPRESSION: No evidence of acute cardiopulmonary disease.   Electronically Signed   By: Charline Bills M.D.   On: 11/17/2012 12:08    EKG:  Independently reviewed   Principal Problem:   Altered mental status: etiology unclear. Certainly concern for stroke, tia, seizure. Also consider hypoglycemia and/or infectious process. Will admit to tele. Will complete stroke work up i.e. MRI/MRA brain, carotid doppler, 2 decho. Will check lipid panel and HgA1c. Pt on cozaar at home. Will hold for now and monitor BP closely.  Will give asa and continue statin. Pt passed bedside swallow eval in ED. Will request PT eval and neuro consult.  Will obtain blood cultures and urinalysis and chest xray to check for infection. Will check HgA1c and use SSI for optimal glycemic control. Pt on 80 units lantus at home. Will order 20 units for now until po intake reliable.  Active Problems: Hypothermia: etiology unclear. Concern for infectious process. See #1. Await blood culture, urinalysis and chest xray. Improving at my exam.  Continue warming blanket.     Leukocytosis: Mild. May be reactive. Will obtain chest xray, urinalysis and blood cultures. Will recheck in am.     Chronic kidney disease: stage unclear. Current creatinine within the limits of normal.  Will monitor intake and output. Hold any nephrotoxins.     GERD (gastroesophageal reflux disease): continue PPI    Neurology Code Status: full  mily Communication: none present Disposition Plan: home when ready  Time spent:   Gwenyth Bender [facility] Pager [phone]  If 7PM-7AM, please contact night-coverage [email or web address] Password [facility] 11/17/2012, 12:14 PM

## 2012-11-17 NOTE — ED Notes (Signed)
Pt alert at this time, answers questions appropriately.  Responsive.  nad noted.  Family at bedside.

## 2012-11-17 NOTE — Progress Notes (Signed)
According to the reporting nurse the pt passed his swallow screen.  The patient request regular food.  Ordered regular diet as prescribed and I will continue to monitor him.

## 2012-11-17 NOTE — ED Notes (Signed)
md notified of pt's current bs, order given to feed the pt., the pt ate peanut butter crackers and coke.  Family at bedside.

## 2012-11-17 NOTE — ED Notes (Signed)
Altered mental status.  Attempting to do neuro assessment.  Unable to complete . Pt does not follow commands.  Last known normal time is 7 am.

## 2012-11-17 NOTE — Progress Notes (Signed)
EEG Tech came from [facility] to perform EEG, patient in ultrasound. EEG tech will be back 11/18/12 in the am

## 2012-11-17 NOTE — ED Notes (Signed)
Pt starting to talk now.  Pt seems oriented.   Moved to room 2 for closer monitoring.

## 2012-11-17 NOTE — ED Provider Notes (Signed)
CSN: [id number]     Arrival date & time 11/17/12  4098 History  This chart was scribed for Benny Lennert, MD by Bennett Scrape, ED Scribe. This patient was seen in room APA06/APA06 and the patient's care was started at 8:56 AM.   Chief Complaint  Patient presents with  . Altered Mental Status   Level 5 Caveat-AMS  The history is provided by the spouse. Allen language interpreter was used.    HPI Comments: AADITYA Yu is a 67 y.o. male who presents to the Emergency Department complaining of AMS. Wife last saw the pt at his baseline around 7 am upon waking. His was talking and behaving at his normal baseline. Wife states that the pt appeared "drenched in sweat" at the time but didn't think much of it due to the house feeling warm. She states that the pt finally got up out of bed around 8 am but appeared confused. His lips then started trembling. Wife states that she has seen the pt in a similar state before due to hypoglycemia and gave the pt a miniature Snickers bar with mild improvement. They went to leave the house to go to an appointment for her, but pt started shaking again and the shakes became increasingly more severe. He then stopped responding and needed assistance walking. This is different from his baseline and at this point she became concerned enough to bring him in for evaluation. She denies any prior episodes of unresponsiveness or severe shaking with prior hypoglycemic episodes. Pt is currently unable to answer any further questions.    Past Medical History  Diagnosis Date  . Diabetes mellitus   . Chronic kidney disease     stones hx  . PTSD (post-traumatic stress disorder)   . GERD (gastroesophageal reflux disease)    Past Surgical History  Procedure Laterality Date  . Hernia repair  rt ing  . Bladder surgery      02  . Back surgery      x2  . Appendectomy    . Cholecystectomy     Allen family history on file. History  Substance Use Topics  . Smoking status:  Current Every Day Smoker -- 1.00 packs/day    Types: Cigarettes  . Smokeless tobacco: Not on file  . Alcohol Use: Allen    Review of Systems  Unable to perform ROS: Mental status change    Allergies  Bee venom; Codeine; and Diovan  Home Medications   Current Outpatient Rx  Name  Route  Sig  Dispense  Refill  . aspirin EC 81 MG tablet   Oral   Take 81 mg by mouth daily.           [name] atorvastatin (LIPITOR) 20 MG tablet   Oral   Take 20 mg by mouth daily.           [name] EPINEPHrine (EPIPEN) 0.3 mg/0.3 mL DEVI   Intramuscular   Inject 0.3 mLs (0.3 mg total) into the muscle as needed.   1 Device   0   . HYDROcodone-acetaminophen (NORCO/VICODIN) 5-325 MG per tablet   Oral   Take 1 tablet by mouth every 4 (four) hours as needed for pain.   15 tablet   0   . insulin glargine (LANTUS) 100 UNIT/ML injection   Subcutaneous   Inject 80 Units into the skin at bedtime.         . insulin lispro (HUMALOG) 100 UNIT/ML injection   Subcutaneous   Inject 10  Units into the skin 3 (three) times daily before meals.           [name] losartan (COZAAR) 50 MG tablet   Oral   Take 50 mg by mouth daily.           [name] omeprazole (PRILOSEC) 20 MG capsule   Oral   Take 20 mg by mouth daily.         . Tamsulosin HCl (FLOMAX) 0.4 MG CAPS   Oral   Take 0.4 mg by mouth daily.            Triage Vitals: BP 166/95  Pulse 95  Ht 6\' 1"  (1.854 m)  Wt 176 lb (79.833 kg)  BMI 23.23 kg/m2  Physical Exam  Nursing note and vitals reviewed. Constitutional: He appears well-developed and well-nourished.  HENT:  Head: Normocephalic and atraumatic.  Eyes: Conjunctivae and EOM are normal. Allen scleral icterus.  Neck: Neck supple. Allen thyromegaly present.  Cardiovascular: Normal rate and regular rhythm.  Exam reveals Allen gallop and Allen friction rub.   Allen murmur heard. Pulmonary/Chest: Effort normal and breath sounds normal. Allen stridor. He has Allen wheezes. He has Allen rales. He exhibits Allen tenderness.   Abdominal: Soft. He exhibits Allen distension. There is Allen tenderness. There is Allen rebound.  Musculoskeletal: Normal range of motion. He exhibits Allen edema.  Lymphadenopathy:    He has Allen cervical adenopathy.  Neurological: He exhibits normal muscle tone. Coordination normal.  Pt is awake, will not respond to verbal stimuli, will respond to painful stimuli, moves all extremities.   Skin: Skin is warm and dry. Allen rash noted. Allen erythema.    ED Course  Procedures (including critical care time)  DIAGNOSTIC STUDIES: Oxygen Saturation is 99% on room air, normal by my interpretation.    COORDINATION OF CARE: 9:02 AM-Discussed treatment plan which includes CT of head with pt's wife at bedside and she agreed to plan.   9:26 AM-Allen CVA or TIA noted on CT of head.  9:30 AM-Spoke with Neurologist. Discussed pt's case and symptoms. Neurologist does not believe symptoms are CVA or TIA related. Advised to Allen Yu for observation.   9:49 AM-Pt rechecked and is answering questions. Is oriented and appears to be improving with bear hugger. States he wants to go home. Informed wife of consult with Allen and plan to Allen Yu to monitor for continued improvement. Advised wife that symptoms could have been from chronically low BS that caused a decrease in body temperature. Wife is agreeable to admission.   10:57 AM-spoke with pt who is quicker to respond to questioning. Discussed admission with pt and pt agreed.  Labs Review Labs Reviewed - Allen data to display Imaging Review Ct Head Wo Contrast  11/17/2012   CLINICAL DATA:  Code stroke  EXAM: CT HEAD WITHOUT CONTRAST  TECHNIQUE: Contiguous axial images were obtained from the base of the skull through the vertex without intravenous contrast.  COMPARISON:  05/19/2012  FINDINGS: Motion degraded images.  Allen evidence of parenchymal hemorrhage or extra-axial fluid collection. Allen mass lesion, mass effect, or midline shift. Allen CT evidence of acute infarction.   Subcortical white matter and periventricular small vessel ischemic changes. Intracranial atherosclerosis.  Mild global cortical atrophy. Allen ventriculomegaly.  The visualized paranasal sinuses are essentially clear. The mastoid air cells are unopacified.  Allen evidence of calvarial fracture.  IMPRESSION: Allen evidence of acute intracranial abnormality.  Mild atrophy with small vessel ischemic changes and intracranial atherosclerosis.  These results were called by telephone  at the time of interpretation on 11/17/2012 at 9:25 AM to Dr. Bethann Berkshire , who verbally acknowledged these results.   Electronically Signed   By: Charline Bills M.D.   On: 11/17/2012 09:26    EKG Interpretation     Ventricular Rate:  80 PR Interval:  142 QRS Duration: 76 QT Interval:  394 QTC Calculation: 454 R Axis:   -21 Text Interpretation:  Sinus rhythm with Premature atrial complexes with Abberant conduction Otherwise normal ECG When compared with ECG of 06-Aug-2011 09:26, Abberant conduction is now Present            MDM  Allen diagnosis found.   Allen Yu,  Allen Yu, Allen Yu   Benny Lennert, MD 11/17/12 1119

## 2012-11-18 ENCOUNTER — Inpatient Hospital Stay (HOSPITAL_COMMUNITY)
Admit: 2012-11-18 | Discharge: 2012-11-18 | Disposition: A | Discharge status: Home / Self Care | Payer: Medicare Other | Attending: Internal Medicine | Admitting: Internal Medicine

## 2012-11-18 DIAGNOSIS — I517 Cardiomegaly: Secondary | ICD-10-CM

## 2012-11-18 DIAGNOSIS — E119 Type 2 diabetes mellitus without complications: Secondary | ICD-10-CM | POA: Diagnosis not present

## 2012-11-18 DIAGNOSIS — R569 Unspecified convulsions: Secondary | ICD-10-CM | POA: Diagnosis not present

## 2012-11-18 LAB — GLUCOSE, CAPILLARY
Glucose-Capillary: 103 mg/dL — ABNORMAL HIGH (ref 70–99)
Glucose-Capillary: 166 mg/dL — ABNORMAL HIGH (ref 70–99)
Glucose-Capillary: 63 mg/dL — ABNORMAL LOW (ref 70–99)
Glucose-Capillary: 87 mg/dL (ref 70–99)
Glucose-Capillary: 120 mg/dL — ABNORMAL HIGH (ref 70–99)

## 2012-11-18 LAB — BASIC METABOLIC PANEL
Calcium: 9.1 mg/dL (ref 8.4–10.5)
Chloride: 107 mEq/L (ref 96–112)
Creatinine, Ser: 1.07 mg/dL (ref 0.50–1.35)
GFR calc Af Amer: 82 mL/min — ABNORMAL LOW (ref >90)
Sodium: 141 mEq/L (ref 135–145)

## 2012-11-18 LAB — URINALYSIS, ROUTINE W REFLEX MICROSCOPIC
Ketones, ur: NEGATIVE mg/dL
Leukocytes, UA: NEGATIVE
Nitrite: NEGATIVE
pH: 6.5 (ref 5.0–8.0)

## 2012-11-18 LAB — URINE MICROSCOPIC-ADD ON

## 2012-11-18 LAB — CBC
HCT: 28.8 % — ABNORMAL LOW (ref 39.0–52.0)
Hemoglobin: 9.8 g/dL — ABNORMAL LOW (ref 13.0–17.0)
MCH: 31.3 pg (ref 26.0–34.0)
MCV: 92 fL (ref 78.0–100.0)
Platelets: 188 10*3/uL (ref 150–400)
RBC: 3.13 MIL/uL — ABNORMAL LOW (ref 4.22–5.81)
RDW: 14.4 % (ref 11.5–15.5)
WBC: 6.5 10*3/uL (ref 4.0–10.5)

## 2012-11-18 LAB — HEMOGLOBIN A1C
Hgb A1c MFr Bld: 6.3 % — ABNORMAL HIGH (ref <5.7)
Mean Plasma Glucose: 134 mg/dL — ABNORMAL HIGH (ref <117)

## 2012-11-18 MED ORDER — POTASSIUM CHLORIDE CRYS ER 20 MEQ PO TBCR
20.0000 meq | EXTENDED_RELEASE_TABLET | Freq: Three times a day (TID) | ORAL | Status: DC
  Administered 2012-11-18 – 2012-11-19 (×4): 20 meq via ORAL
  Filled 2012-11-18 (×4): qty 1

## 2012-11-18 MED ORDER — INSULIN GLARGINE 100 UNIT/ML ~~LOC~~ SOLN
10.0000 [IU] | Freq: Every day | SUBCUTANEOUS | Status: DC
  Filled 2012-11-18: qty 0.1

## 2012-11-18 NOTE — Progress Notes (Signed)
EEG Completed; Results Pending  

## 2012-11-18 NOTE — Progress Notes (Addendum)
Pt had CBG of 63 gave pt an orange juice and CBG only came up to 84. MD notified, orders received. Will continue to monitor.

## 2012-11-18 NOTE — Progress Notes (Signed)
*  PRELIMINARY RESULTS* Echocardiogram 2D Echocardiogram has been performed.  Allen Yu 11/18/2012, 1:44 PM

## 2012-11-19 DIAGNOSIS — E119 Type 2 diabetes mellitus without complications: Secondary | ICD-10-CM | POA: Diagnosis not present

## 2012-11-19 LAB — BASIC METABOLIC PANEL
BUN: 9 mg/dL (ref 6–23)
Chloride: 104 mEq/L (ref 96–112)
GFR calc Af Amer: 86 mL/min — ABNORMAL LOW (ref >90)
GFR calc non Af Amer: 75 mL/min — ABNORMAL LOW (ref >90)
Potassium: 3.8 mEq/L (ref 3.5–5.1)

## 2012-11-19 NOTE — Progress Notes (Signed)
Pt and his friend verbalize understanding of d/c instructions, med changes and follow up information. Pt is to call Dr. Alonza Smoker office tomorrow to inform them of blood sugars and schedule an appt for in 1 wk. IV was d/c without complications. Pt has no questions at this time. Pt d/c via wheelchair, accompanied by NT and pts friend. Sheryn Bison

## 2012-11-19 NOTE — Procedures (Signed)
[facility] Melanni Benway A. Gerilyn Pilgrim, MD     [email or web address]       NAME:  KALEB, SEK NO.:  [id number]  MEDICAL RECORD NO.:  [id number]  LOCATION:  EE                           FACILITY:  [facility]  PHYSICIAN:  Rozanne Heumann A. Gerilyn Pilgrim, M.D. DATE OF BIRTH:  12/28/1945  DATE OF PROCEDURE:  11/18/2012 DATE OF DISCHARGE:  11/18/2012                             EEG INTERPRETATION   INDICATION FOR STUDY:  A 67 year old man who presents with altered mental status and confusion.  This study is being done to evaluate for seizures.  MEDICATIONS:  Insulin, Protonix, Lovenox, potassium, Senokot, Flomax, Lipitor.  ANALYSIS:  This is a 16-channel recording using standard 10/20 measurement.  It is conducted for 21 minutes.  There is a well-formed posterior dominant rhythm of 12 Hz, which attenuates with eye opening. There is beta activity observed in the frontal areas.  Awake and sleep activities are recorded, K complexes, sleep spindles are observed. Photic stimulation and hyperventilation were not carried out.  There is no focal or lateralized slowing.  There is no epileptiform activity observed.  IMPRESSION:  Normal recording of awake and sleep states.     Caedence Snowden A. Gerilyn Pilgrim, M.D.     KAD/MEDQ  D:  11/19/2012  T:  11/19/2012  Job:  [id number]

## 2012-11-19 NOTE — Progress Notes (Signed)
NAMEMarland Yu  URIYAH, RASKA NO.:  [id number]  MEDICAL RECORD NO.:  [id number]  LOCATION:  EE                           FACILITY:  [facility]  PHYSICIAN:  Kingsley Callander. Ouida Sills, MD       DATE OF BIRTH:  10/30/1945  DATE OF PROCEDURE:  11/18/2012 DATE OF DISCHARGE:  11/18/2012                                PROGRESS NOTE   Kasim was admitted yesterday by the hospitalist after presenting with hypoglycemia, hypothermia, and altered mental status.  He underwent a CT of the brain and an MRI of the brain, which revealed no evidence of acute stroke, his glucose 77 upon presentation at the ER.  He has dropped his glucose to 41 overnight despite receiving only 20 units of Lantus and another episode of hypoglycemia later today with a glucose of 63.  His mental status has returned to his baseline level.  He is eating without difficulty.  PHYSICAL EXAMINATION:  GENERAL:  Afebrile. VITAL SIGNS:  Stable. LUNGS:  Clear. HEART:  Regular with no murmurs. ABDOMEN:  Soft and nontender with no organomegaly. EXTREMITIES:  No edema. NEURO:  No focal weakness.  IMPRESSION/PLAN: 1. Hypoglycemia.  We will hold Lantus completely now.  Continue     current oral intake. 2. Hypokalemia.  Serum potassium is dropped to 3.1.  He is being     supplemented orally. 3. Anemia of chronic disease.  Hemoglobin now is 9.8 with normal white     cells and normal platelets.     Kingsley Callander. Ouida Sills, MD     ROF/MEDQ  D:  11/18/2012  T:  11/19/2012  Job:  [id number]

## 2012-11-19 NOTE — Consult Note (Signed)
[facility] Allen Yu A. Allen Pilgrim, MD     [email or web address]          Allen Yu is an 67 y.o. male.   ASSESSMENT/PLAN: At baseline.  Alert x O x 3. Being discharged at this time. Possible hypoglycemia induced AMS. FU EEG.   Past Medical History  Diagnosis Date  . Diabetes mellitus   . Chronic kidney disease     stones hx  . PTSD (post-traumatic stress disorder)   . GERD (gastroesophageal reflux disease)   . Chronic back pain     Past Surgical History  Procedure Laterality Date  . Hernia repair  rt ing  . Bladder surgery      02  . Back surgery      x2  . Appendectomy    . Cholecystectomy      History reviewed. No pertinent family history.  Social History:  reports that he has been smoking Cigarettes.  He has been smoking about 1.00 pack per day. He does not have any smokeless tobacco history on file. He reports that he uses illicit drugs (Marijuana). He reports that he does not drink alcohol.  Allergies:  Allergies  Allergen Reactions  . Bee Venom Other (See Comments)    Unknown   . Codeine Other (See Comments)    incoherent   . Diovan [Valsartan] Other (See Comments)    incoherent    Medications: Prior to Admission medications   Medication Sig Start Date End Date Taking? Authorizing Provider  aspirin EC 81 MG tablet Take 81 mg by mouth daily.     Yes Historical Provider, MD  atorvastatin (LIPITOR) 20 MG tablet Take 20 mg by mouth daily.     Yes Historical Provider, MD  losartan (COZAAR) 50 MG tablet Take 50 mg by mouth daily.     Yes Historical Provider, MD  omeprazole (PRILOSEC) 20 MG capsule Take 20 mg by mouth daily.   Yes Historical Provider, MD  Tamsulosin HCl (FLOMAX) 0.4 MG CAPS Take 0.4 mg by mouth daily.     Yes Historical Provider, MD    Scheduled Meds: . aspirin  300 mg Rectal Daily   Or  . aspirin  325 mg Oral Daily  . atorvastatin  20 mg Oral Daily  . enoxaparin (LOVENOX) injection  40 mg Subcutaneous Q24H  . insulin aspart   0-15 Units Subcutaneous TID WC  . pantoprazole  40 mg Oral Daily  . potassium chloride  20 mEq Oral TID  . senna-docusate  1 tablet Oral BID  . tamsulosin  0.4 mg Oral Daily   Continuous Infusions: . sodium chloride 50 mL/hr at 11/18/12 0726   PRN Meds:.   Blood pressure 152/82, pulse 70, temperature 98.6 F (37 C), temperature source Oral, resp. rate 18, height 6\' 1"  (1.854 m), weight 79.833 kg (176 lb), SpO2 100.00%.   Results for orders placed during the hospital encounter of 11/17/12 (from the past 48 hour(s))  CBC WITH DIFFERENTIAL     Status: Abnormal   Collection Time    11/17/12  9:35 AM      Result Value Range   WBC 8.7  4.0 - 10.5 K/uL   RBC 3.57 (*) 4.22 - 5.81 MIL/uL   Hemoglobin 11.3 (*) 13.0 - 17.0 g/dL   HCT 04.5 (*) 40.9 - 81.1 %   MCV 93.0  78.0 - 100.0 fL   MCH 31.7  26.0 - 34.0 pg   MCHC 34.0  30.0 - 36.0 g/dL   RDW 91.4  11.5 - 15.5 %   Platelets 236  150 - 400 K/uL   Neutrophils Relative % 82 (*) 43 - 77 %   Neutro Abs 7.2  1.7 - 7.7 K/uL   Lymphocytes Relative 11 (*) 12 - 46 %   Lymphs Abs 1.0  0.7 - 4.0 K/uL   Monocytes Relative 6  3 - 12 %   Monocytes Absolute 0.5  0.1 - 1.0 K/uL   Eosinophils Relative 1  0 - 5 %   Eosinophils Absolute 0.0  0.0 - 0.7 K/uL   Basophils Relative 0  0 - 1 %   Basophils Absolute 0.0  0.0 - 0.1 K/uL  COMPREHENSIVE METABOLIC PANEL     Status: Abnormal   Collection Time    11/17/12  9:35 AM      Result Value Range   Sodium 142  135 - 145 mEq/L   Potassium 3.5  3.5 - 5.1 mEq/L   Chloride 104  96 - 112 mEq/L   CO2 28  19 - 32 mEq/L   Glucose, Bld 77  70 - 99 mg/dL   BUN 15  6 - 23 mg/dL   Creatinine, Ser 4.09  0.50 - 1.35 mg/dL   Calcium 9.7  8.4 - 81.1 mg/dL   Total Protein 7.5  6.0 - 8.3 g/dL   Albumin 4.4  3.5 - 5.2 g/dL   AST 34  0 - 37 U/L   ALT 23  0 - 53 U/L   Alkaline Phosphatase 103  39 - 117 U/L   Total Bilirubin 0.7  0.3 - 1.2 mg/dL   GFR calc non Af Amer 62 (*) >90 mL/min   GFR calc Af Amer 72 (*)  >90 mL/min   Comment: (NOTE)     The eGFR has been calculated using the CKD EPI equation.     This calculation has not been validated in all clinical situations.     eGFR's persistently <90 mL/min signify possible Chronic Kidney     Disease.  TROPONIN I     Status: None   Collection Time    11/17/12  9:35 AM      Result Value Range   Troponin I <0.30  <0.30 ng/mL   Comment:            Due to the release kinetics of cTnI,     a negative result within the first hours     of the onset of symptoms does not rule out     myocardial infarction with certainty.     If myocardial infarction is still suspected,     repeat the test at appropriate intervals.  LACTIC ACID, PLASMA     Status: None   Collection Time    11/17/12  9:46 AM      Result Value Range   Lactic Acid, Venous 1.7  0.5 - 2.2 mmol/L  CULTURE, BLOOD (ROUTINE X 2)     Status: None   Collection Time    11/17/12  9:46 AM      Result Value Range   Specimen Description BLOOD LEFT HAND     Special Requests BOTTLES DRAWN AEROBIC ONLY 6CC     Culture NO GROWTH 1 DAY     Report Status PENDING    CULTURE, BLOOD (ROUTINE X 2)     Status: None   Collection Time    11/17/12 10:03 AM      Result Value Range   Specimen Description BLOOD LEFT HAND  Special Requests BOTTLES DRAWN AEROBIC AND ANAEROBIC 6CC     Culture NO GROWTH 1 DAY     Report Status PENDING    GLUCOSE, CAPILLARY     Status: None   Collection Time    11/17/12 10:43 AM      Result Value Range   Glucose-Capillary 70  70 - 99 mg/dL   Comment 1 Documented in Chart     Comment 2 Notify RN    URINALYSIS, ROUTINE W REFLEX MICROSCOPIC     Status: Abnormal   Collection Time    11/17/12 11:21 AM      Result Value Range   Color, Urine YELLOW  YELLOW   APPearance CLEAR  CLEAR   Specific Gravity, Urine 1.020  1.005 - 1.030   pH 6.0  5.0 - 8.0   Glucose, UA NEGATIVE  NEGATIVE mg/dL   Hgb urine dipstick SMALL (*) NEGATIVE   Bilirubin Urine NEGATIVE  NEGATIVE    Ketones, ur NEGATIVE  NEGATIVE mg/dL   Protein, ur TRACE (*) NEGATIVE mg/dL   Urobilinogen, UA 1.0  0.0 - 1.0 mg/dL   Nitrite NEGATIVE  NEGATIVE   Leukocytes, UA NEGATIVE  NEGATIVE  URINE MICROSCOPIC-ADD ON     Status: None   Collection Time    11/17/12 11:21 AM      Result Value Range   RBC / HPF 3-6  <3 RBC/hpf  URINE RAPID DRUG SCREEN (HOSP PERFORMED)     Status: Abnormal   Collection Time    11/17/12 11:22 AM      Result Value Range   Opiates NONE DETECTED  NONE DETECTED   Cocaine NONE DETECTED  NONE DETECTED   Benzodiazepines NONE DETECTED  NONE DETECTED   Amphetamines NONE DETECTED  NONE DETECTED   Tetrahydrocannabinol POSITIVE (*) NONE DETECTED   Barbiturates NONE DETECTED  NONE DETECTED   Comment:            DRUG SCREEN FOR MEDICAL PURPOSES     ONLY.  IF CONFIRMATION IS NEEDED     FOR ANY PURPOSE, NOTIFY LAB     WITHIN 5 DAYS.                LOWEST DETECTABLE LIMITS     FOR URINE DRUG SCREEN     Drug Class       Cutoff (ng/mL)     Amphetamine      1000     Barbiturate      200     Benzodiazepine   200     Tricyclics       300     Opiates          300     Cocaine          300     THC              50  GLUCOSE, CAPILLARY     Status: Abnormal   Collection Time    11/17/12 12:05 PM      Result Value Range   Glucose-Capillary 181 (*) 70 - 99 mg/dL  HEMOGLOBIN Z6X     Status: Abnormal   Collection Time    11/17/12  1:30 PM      Result Value Range   Hemoglobin A1C 6.3 (*) <5.7 %   Comment: (NOTE)  According to the ADA Clinical Practice Recommendations for 2011, when     HbA1c is used as a screening test:      >=6.5%   Diagnostic of Diabetes Mellitus               (if abnormal result is confirmed)     5.7-6.4%   Increased risk of developing Diabetes Mellitus     References:Diagnosis and Classification of Diabetes Mellitus,Diabetes     Care,2011,34(Suppl 1):S62-S69 and Standards of Medical  Care in             Diabetes - 2011,Diabetes Care,2011,34 (Suppl 1):S11-S61.   Mean Plasma Glucose 134 (*) <117 mg/dL   Comment: Performed at [facility]  LIPID PANEL     Status: None   Collection Time    11/17/12  1:30 PM      Result Value Range   Cholesterol 130  0 - 200 mg/dL   Triglycerides 64  <161 mg/dL   HDL 55  >09 mg/dL   Total CHOL/HDL Ratio 2.4     VLDL 13  0 - 40 mg/dL   LDL Cholesterol 62  0 - 99 mg/dL   Comment:            Total Cholesterol/HDL:CHD Risk     Coronary Heart Disease Risk Table                         Men   Women      1/2 Average Risk   3.4   3.3      Average Risk       5.0   4.4      2 X Average Risk   9.6   7.1      3 X Average Risk  23.4   11.0                Use the calculated Patient Ratio     above and the CHD Risk Table     to determine the patient's CHD Risk.                ATP III CLASSIFICATION (LDL):      <100     mg/dL   Optimal      604-540  mg/dL   Near or Above                        Optimal      130-159  mg/dL   Borderline      981-191  mg/dL   High      >478     mg/dL   Very High  GLUCOSE, CAPILLARY     Status: Abnormal   Collection Time    11/17/12  5:14 PM      Result Value Range   Glucose-Capillary 122 (*) 70 - 99 mg/dL   Comment 1 Notify RN    GLUCOSE, CAPILLARY     Status: Abnormal   Collection Time    11/17/12  9:22 PM      Result Value Range   Glucose-Capillary 123 (*) 70 - 99 mg/dL   Comment 1 Notify RN    URINALYSIS, ROUTINE W REFLEX MICROSCOPIC     Status: Abnormal   Collection Time    11/18/12 12:50 AM      Result Value Range   Color, Urine YELLOW  YELLOW   APPearance CLEAR  CLEAR   Specific Gravity, Urine  1.020  1.005 - 1.030   pH 6.5  5.0 - 8.0   Glucose, UA NEGATIVE  NEGATIVE mg/dL   Hgb urine dipstick TRACE (*) NEGATIVE   Bilirubin Urine NEGATIVE  NEGATIVE   Ketones, ur NEGATIVE  NEGATIVE mg/dL   Protein, ur TRACE (*) NEGATIVE mg/dL   Urobilinogen, UA 1.0  0.0 - 1.0 mg/dL   Nitrite NEGATIVE   NEGATIVE   Leukocytes, UA NEGATIVE  NEGATIVE  URINE MICROSCOPIC-ADD ON     Status: None   Collection Time    11/18/12 12:50 AM      Result Value Range   Squamous Epithelial / LPF RARE  RARE   WBC, UA 0-2  <3 WBC/hpf   RBC / HPF 3-6  <3 RBC/hpf   Bacteria, UA RARE  RARE  BASIC METABOLIC PANEL     Status: Abnormal   Collection Time    11/18/12  6:02 AM      Result Value Range   Sodium 141  135 - 145 mEq/L   Potassium 3.1 (*) 3.5 - 5.1 mEq/L   Chloride 107  96 - 112 mEq/L   CO2 27  19 - 32 mEq/L   Glucose, Bld 41 (*) 70 - 99 mg/dL   Comment: CRITICAL RESULT CALLED TO, READ BACK BY AND VERIFIED WITH:     HEATH,S 7:15AM ON 11/18/12 BY FESTERMAN,C   BUN 13  6 - 23 mg/dL   Creatinine, Ser 1.61  0.50 - 1.35 mg/dL   Calcium 9.1  8.4 - 09.6 mg/dL   GFR calc non Af Amer 70 (*) >90 mL/min   GFR calc Af Amer 82 (*) >90 mL/min   Comment: (NOTE)     The eGFR has been calculated using the CKD EPI equation.     This calculation has not been validated in all clinical situations.     eGFR's persistently <90 mL/min signify possible Chronic Kidney     Disease.  CBC     Status: Abnormal   Collection Time    11/18/12  6:02 AM      Result Value Range   WBC 6.5  4.0 - 10.5 K/uL   RBC 3.13 (*) 4.22 - 5.81 MIL/uL   Hemoglobin 9.8 (*) 13.0 - 17.0 g/dL   HCT 04.5 (*) 40.9 - 81.1 %   MCV 92.0  78.0 - 100.0 fL   MCH 31.3  26.0 - 34.0 pg   MCHC 34.0  30.0 - 36.0 g/dL   RDW 91.4  78.2 - 95.6 %   Platelets 188  150 - 400 K/uL  GLUCOSE, CAPILLARY     Status: Abnormal   Collection Time    11/18/12  7:58 AM      Result Value Range   Glucose-Capillary 103 (*) 70 - 99 mg/dL  GLUCOSE, CAPILLARY     Status: Abnormal   Collection Time    11/18/12 11:48 AM      Result Value Range   Glucose-Capillary 166 (*) 70 - 99 mg/dL  GLUCOSE, CAPILLARY     Status: Abnormal   Collection Time    11/18/12  4:31 PM      Result Value Range   Glucose-Capillary 63 (*) 70 - 99 mg/dL  GLUCOSE, CAPILLARY     Status: None    Collection Time    11/18/12  5:31 PM      Result Value Range   Glucose-Capillary 87  70 - 99 mg/dL  GLUCOSE, CAPILLARY     Status: Abnormal  Collection Time    11/18/12  9:05 PM      Result Value Range   Glucose-Capillary 120 (*) 70 - 99 mg/dL  BASIC METABOLIC PANEL     Status: Abnormal   Collection Time    11/19/12  5:53 AM      Result Value Range   Sodium 139  135 - 145 mEq/L   Potassium 3.8  3.5 - 5.1 mEq/L   Comment: DELTA CHECK NOTED   Chloride 104  96 - 112 mEq/L   CO2 28  19 - 32 mEq/L   Glucose, Bld 96  70 - 99 mg/dL   BUN 9  6 - 23 mg/dL   Creatinine, Ser 2.95  0.50 - 1.35 mg/dL   Calcium 9.5  8.4 - 62.1 mg/dL   GFR calc non Af Amer 75 (*) >90 mL/min   GFR calc Af Amer 86 (*) >90 mL/min   Comment: (NOTE)     The eGFR has been calculated using the CKD EPI equation.     This calculation has not been validated in all clinical situations.     eGFR's persistently <90 mL/min signify possible Chronic Kidney     Disease.  GLUCOSE, CAPILLARY     Status: None   Collection Time    11/19/12  7:24 AM      Result Value Range   Glucose-Capillary 83  70 - 99 mg/dL    Dg Chest 2 View  30/86/5784   CLINICAL DATA:  Stroke.  EXAM: CHEST  2 VIEW  COMPARISON:  11/17/2012.  FINDINGS: The heart size and mediastinal contours are within normal limits. Both lungs are clear. The visualized skeletal structures are unremarkable. Aortic arch atherosclerosis. Lower cervical ACDF. Cholecystectomy clips are present in the right upper quadrant.  IMPRESSION: No active cardiopulmonary disease.   Electronically Signed   By: Andreas Newport M.D.   On: 11/17/2012 16:23   Ct Head Wo Contrast  11/17/2012   CLINICAL DATA:  Code stroke  EXAM: CT HEAD WITHOUT CONTRAST  TECHNIQUE: Contiguous axial images were obtained from the base of the skull through the vertex without intravenous contrast.  COMPARISON:  05/19/2012  FINDINGS: Motion degraded images.  No evidence of parenchymal hemorrhage or extra-axial  fluid collection. No mass lesion, mass effect, or midline shift. No CT evidence of acute infarction.  Subcortical white matter and periventricular small vessel ischemic changes. Intracranial atherosclerosis.  Mild global cortical atrophy. No ventriculomegaly.  The visualized paranasal sinuses are essentially clear. The mastoid air cells are unopacified.  No evidence of calvarial fracture.  IMPRESSION: No evidence of acute intracranial abnormality.  Mild atrophy with small vessel ischemic changes and intracranial atherosclerosis.  These results were called by telephone at the time of interpretation on 11/17/2012 at 9:25 AM to Dr. Bethann Berkshire , who verbally acknowledged these results.   Electronically Signed   By: Charline Bills M.D.   On: 11/17/2012 09:26   Mr Maxine Glenn Head Wo Contrast  11/17/2012   CLINICAL DATA:  Weakness. Syncope. Diabetic.  EXAM: MRI HEAD WITHOUT CONTRAST  MRA HEAD WITHOUT CONTRAST  TECHNIQUE: Multiplanar, multiecho pulse sequences of the brain and surrounding structures were obtained without intravenous contrast. Angiographic images of the head were obtained using MRA technique without contrast.  COMPARISON:  11/17/2012 CT.  03/10/2009 MR and MR angiogram.  FINDINGS: MRI HEAD FINDINGS  No acute infarct.  No intracranial hemorrhage.  Moderate white matter type changes have progressed since prior exam most likely reflecting result of small vessel  disease in this diabetic patient.  No intracranial mass lesion noted on this unenhanced exam.  No hydrocephalus.  Minimal paranasal sinus mucosal thickening.  MRA HEAD FINDINGS  Aplastic A1 segment right anterior cerebral artery. A2 segment is supplied bilaterally from the left.  Moderate to marked narrowing of the M1 segment of the right middle cerebral artery.  Artifact petrous segment internal carotid artery bilaterally.  Mild to moderate narrowing versus artifact cavernous segment right internal carotid artery.  Middle cerebral artery branch  vessel irregularity with narrowing noted bilaterally.  Left vertebral artery is slightly dominant in size.  Large left posterior inferior cerebellar artery. Smaller right posterior inferior cerebral artery with areas of mild narrowing.  No significant stenosis of the vertebral arteries or basilar artery.  Non visualization AICAs.  Posterior cerebral artery distal branch vessel irregularity bilaterally.  No aneurysm detected.  IMPRESSION: MRI HEAD IMPRESSION  No acute infarct.  Moderate small vessel disease type changes.  Please see above.  MRA HEAD IMPRESSION  Intracranial atherosclerotic type changes most notable involving the M1 segment of the right middle cerebral artery as detailed above.   Electronically Signed   By: Bridgett Larsson M.D.   On: 11/17/2012 16:07   Mr Brain Wo Contrast  11/17/2012   CLINICAL DATA:  Weakness. Syncope. Diabetic.  EXAM: MRI HEAD WITHOUT CONTRAST  MRA HEAD WITHOUT CONTRAST  TECHNIQUE: Multiplanar, multiecho pulse sequences of the brain and surrounding structures were obtained without intravenous contrast. Angiographic images of the head were obtained using MRA technique without contrast.  COMPARISON:  11/17/2012 CT.  03/10/2009 MR and MR angiogram.  FINDINGS: MRI HEAD FINDINGS  No acute infarct.  No intracranial hemorrhage.  Moderate white matter type changes have progressed since prior exam most likely reflecting result of small vessel disease in this diabetic patient.  No intracranial mass lesion noted on this unenhanced exam.  No hydrocephalus.  Minimal paranasal sinus mucosal thickening.  MRA HEAD FINDINGS  Aplastic A1 segment right anterior cerebral artery. A2 segment is supplied bilaterally from the left.  Moderate to marked narrowing of the M1 segment of the right middle cerebral artery.  Artifact petrous segment internal carotid artery bilaterally.  Mild to moderate narrowing versus artifact cavernous segment right internal carotid artery.  Middle cerebral artery branch  vessel irregularity with narrowing noted bilaterally.  Left vertebral artery is slightly dominant in size.  Large left posterior inferior cerebellar artery. Smaller right posterior inferior cerebral artery with areas of mild narrowing.  No significant stenosis of the vertebral arteries or basilar artery.  Non visualization AICAs.  Posterior cerebral artery distal branch vessel irregularity bilaterally.  No aneurysm detected.  IMPRESSION: MRI HEAD IMPRESSION  No acute infarct.  Moderate small vessel disease type changes.  Please see above.  MRA HEAD IMPRESSION  Intracranial atherosclerotic type changes most notable involving the M1 segment of the right middle cerebral artery as detailed above.   Electronically Signed   By: Bridgett Larsson M.D.   On: 11/17/2012 16:07   US Carotid Duplex Bilateral  11/17/2012   CLINICAL DATA:  Stroke  EXAM: BILATERAL CAROTID DUPLEX ULTRASOUND  TECHNIQUE: [name] scale imaging, color Doppler and duplex ultrasound were performed of bilateral carotid and vertebral arteries in the neck.  COMPARISON:  None.  FINDINGS: Criteria: Quantification of carotid stenosis is based on velocity parameters that correlate the residual internal carotid diameter with NASCET-based stenosis levels, using the diameter of the distal internal carotid lumen as the denominator for stenosis measurement.  The following velocity measurements  were obtained:  RIGHT  ICA:  104 cm/sec  CCA:  104 cm/sec  SYSTOLIC ICA/CCA RATIO:  1.11  DIASTOLIC ICA/CCA RATIO:  ECA:  138 cm/sec  LEFT  ICA:  101 cm/sec  CCA:  9 cm/sec  SYSTOLIC ICA/CCA RATIO:  1.12  DIASTOLIC ICA/CCA RATIO:  ECA:  78 cm/sec  RIGHT CAROTID ARTERY: Mild next plaque in the bulb. Low resistance internal carotid Doppler pattern.  RIGHT VERTEBRAL ARTERY: Antegrade. Normal low resistance Doppler pattern.  LEFT CAROTID ARTERY: Minimal calcified plaque in the bulb. Low resistance internal carotid Doppler pattern.  LEFT VERTEBRAL ARTERY:  Antegrade.  IMPRESSION: Less  than 50% stenosis in the right and left internal carotid arteries.   Electronically Signed   By: Maryclare Bean M.D.   On: 11/17/2012 16:35   Dg Chest Portable 1 View  11/17/2012   CLINICAL DATA:  Altered mental status  EXAM: PORTABLE CHEST - 1 VIEW  COMPARISON:  05/19/2012  FINDINGS: Lungs are essentially clear. No focal consolidation. No pleural effusion or pneumothorax.  The heart is normal in size.  IMPRESSION: No evidence of acute cardiopulmonary disease.   Electronically Signed   By: Charline Bills M.D.   On: 11/17/2012 12:08        Mikayah Joy A. Allen Yu, M.D.  Diplomate, Biomedical engineer of Psychiatry and Neurology ( Neurology). 11/19/2012, 9:08 AM      h

## 2012-11-20 NOTE — Discharge Summary (Signed)
NAMEMarland Kitchen  KALMEN, LOLLAR NO.:  [id number]  MEDICAL RECORD NO.:  [id number]  LOCATION:                            FACILITY:  [facility]  PHYSICIAN:  Kingsley Callander. Ouida Sills, MD       DATE OF BIRTH:  06-23-1945  DATE OF ADMISSION:  11/17/2012 DATE OF DISCHARGE:  10/29/2014LH                              DISCHARGE SUMMARY   DISCHARGE DIAGNOSES: 1. Hypoglycemia. 2. Diabetes. 3. Hypothermia. 4. Hypokalemia. 5. Schizophrenia. 6. Anemia of chronic disease. 7. Benign prostatic hyperplasia. 8. Hypertension. 9. Gastroesophageal reflux disease. 10.Posttraumatic stress disorder. 11.Degenerative disk disease.  HOSPITAL COURSE:  This patient is a 67 year old male who presented with a confusion, trembling, and altered mental status, felt secondary to hypoglycemia, his lowest recorded glucose initially appears to been 77. He was hospitalized and treated for hypoglycemia with normalization of his blood sugars and resolution of his altered mental status, and he underwent additional evaluation including CT of the brain and MRI of the brain, which revealed no evidence of acute stroke.  He had carotid ultrasounds performed which revealed no evidence of hemodynamically significant stenosis.  He had an echocardiogram which revealed normal left ventricular function with an EF of approximately 55%.  He had mild LVH, grade 2 diastolic dysfunction was present.  He had an anemia of chronic disease with a hemoglobin of 9.8 with an MCV of 92.  He has not had any evidence of bleeding.  His Lantus was held.  His glucose on the morning of discharge is 96. Lantus will continued to be held and he will check his glucoses 4 times a day and will notify me tomorrow what today's glucose is.  He will have readjustment of his insulin regimen pending subsequent readings.  His hemoglobin A1c is 6.3.  His urinalysis reveals trace protein.  He had 3- 6 rbc's present.  He has history of kidney stones.  His  drug screen is positive for THC only.  He is back to baseline, neurologically now.  His condition is much improved.  He will continue a 2000 calorie ADA diet.  He will have followup in my office in 1 week.  DISCHARGE MEDICATIONS: 1. Aspirin 81 mg daily. 2. Atorvastatin 20 mg daily. 3. Losartan 50 mg daily. 4. Omeprazole 20 mg daily. 5. Tamsulosin 0.4 mg daily.  As stated, his insulin regimen will be re-initiated as additional glucose readings are available at home.     Kingsley Callander. Ouida Sills, MD     ROF/MEDQ  D:  11/19/2012  T:  11/19/2012  Job:  [id number]

## 2012-11-22 LAB — CULTURE, BLOOD (ROUTINE X 2)

## 2012-11-25 DIAGNOSIS — E1129 Type 2 diabetes mellitus with other diabetic kidney complication: Secondary | ICD-10-CM | POA: Diagnosis not present

## 2012-11-25 DIAGNOSIS — Z23 Encounter for immunization: Secondary | ICD-10-CM | POA: Diagnosis not present

## 2012-12-29 DIAGNOSIS — E1129 Type 2 diabetes mellitus with other diabetic kidney complication: Secondary | ICD-10-CM | POA: Diagnosis not present

## 2013-02-02 DIAGNOSIS — J209 Acute bronchitis, unspecified: Secondary | ICD-10-CM | POA: Diagnosis not present

## 2013-02-24 DIAGNOSIS — N390 Urinary tract infection, site not specified: Secondary | ICD-10-CM | POA: Diagnosis not present

## 2013-02-24 DIAGNOSIS — N400 Enlarged prostate without lower urinary tract symptoms: Secondary | ICD-10-CM | POA: Diagnosis not present

## 2013-02-24 DIAGNOSIS — R1032 Left lower quadrant pain: Secondary | ICD-10-CM | POA: Diagnosis not present

## 2013-02-25 DIAGNOSIS — N390 Urinary tract infection, site not specified: Secondary | ICD-10-CM | POA: Diagnosis not present

## 2013-03-17 DIAGNOSIS — N529 Male erectile dysfunction, unspecified: Secondary | ICD-10-CM | POA: Diagnosis not present

## 2013-03-17 DIAGNOSIS — N400 Enlarged prostate without lower urinary tract symptoms: Secondary | ICD-10-CM | POA: Diagnosis not present

## 2013-03-27 DIAGNOSIS — F209 Schizophrenia, unspecified: Secondary | ICD-10-CM | POA: Diagnosis not present

## 2013-03-27 DIAGNOSIS — Z79899 Other long term (current) drug therapy: Secondary | ICD-10-CM | POA: Diagnosis not present

## 2013-03-27 DIAGNOSIS — E119 Type 2 diabetes mellitus without complications: Secondary | ICD-10-CM | POA: Diagnosis not present

## 2013-04-25 ENCOUNTER — Emergency Department (HOSPITAL_COMMUNITY)
Admission: EM | Admit: 2013-04-25 | Discharge: 2013-04-25 | Disposition: A | Discharge status: Home / Self Care | Payer: Medicare Other | Attending: Emergency Medicine | Admitting: Emergency Medicine

## 2013-04-25 ENCOUNTER — Encounter (HOSPITAL_COMMUNITY): Payer: Self-pay | Admitting: Emergency Medicine

## 2013-04-25 ENCOUNTER — Emergency Department (HOSPITAL_COMMUNITY): Payer: Medicare Other

## 2013-04-25 DIAGNOSIS — F17200 Nicotine dependence, unspecified, uncomplicated: Secondary | ICD-10-CM | POA: Diagnosis not present

## 2013-04-25 DIAGNOSIS — L089 Local infection of the skin and subcutaneous tissue, unspecified: Secondary | ICD-10-CM | POA: Insufficient documentation

## 2013-04-25 DIAGNOSIS — N189 Chronic kidney disease, unspecified: Secondary | ICD-10-CM | POA: Diagnosis not present

## 2013-04-25 DIAGNOSIS — K219 Gastro-esophageal reflux disease without esophagitis: Secondary | ICD-10-CM | POA: Diagnosis not present

## 2013-04-25 DIAGNOSIS — E119 Type 2 diabetes mellitus without complications: Secondary | ICD-10-CM | POA: Insufficient documentation

## 2013-04-25 DIAGNOSIS — Z79899 Other long term (current) drug therapy: Secondary | ICD-10-CM | POA: Diagnosis not present

## 2013-04-25 DIAGNOSIS — Z87442 Personal history of urinary calculi: Secondary | ICD-10-CM | POA: Insufficient documentation

## 2013-04-25 DIAGNOSIS — M7989 Other specified soft tissue disorders: Secondary | ICD-10-CM | POA: Insufficient documentation

## 2013-04-25 DIAGNOSIS — G8929 Other chronic pain: Secondary | ICD-10-CM | POA: Diagnosis not present

## 2013-04-25 DIAGNOSIS — Z7982 Long term (current) use of aspirin: Secondary | ICD-10-CM | POA: Diagnosis not present

## 2013-04-25 DIAGNOSIS — Z8659 Personal history of other mental and behavioral disorders: Secondary | ICD-10-CM | POA: Insufficient documentation

## 2013-04-25 DIAGNOSIS — L539 Erythematous condition, unspecified: Secondary | ICD-10-CM | POA: Insufficient documentation

## 2013-04-25 DIAGNOSIS — M79609 Pain in unspecified limb: Secondary | ICD-10-CM | POA: Diagnosis not present

## 2013-04-25 LAB — CBC WITH DIFFERENTIAL/PLATELET
Basophils Absolute: 0 10*3/uL (ref 0.0–0.1)
Basophils Relative: 1 % (ref 0–1)
EOS ABS: 0 10*3/uL (ref 0.0–0.7)
Eosinophils Relative: 1 % (ref 0–5)
HCT: 27.7 % — ABNORMAL LOW (ref 39.0–52.0)
Hemoglobin: 9.7 g/dL — ABNORMAL LOW (ref 13.0–17.0)
LYMPHS ABS: 1.6 10*3/uL (ref 0.7–4.0)
Lymphocytes Relative: 26 % (ref 12–46)
MCH: 31.7 pg (ref 26.0–34.0)
MCHC: 35 g/dL (ref 30.0–36.0)
MCV: 90.5 fL (ref 78.0–100.0)
Monocytes Absolute: 0.4 10*3/uL (ref 0.1–1.0)
Monocytes Relative: 7 % (ref 3–12)
NEUTROS PCT: 67 % (ref 43–77)
Neutro Abs: 4.1 10*3/uL (ref 1.7–7.7)
Platelets: 254 10*3/uL (ref 150–400)
RBC: 3.06 MIL/uL — AB (ref 4.22–5.81)
RDW: 13.1 % (ref 11.5–15.5)
WBC: 6.1 10*3/uL (ref 4.0–10.5)

## 2013-04-25 LAB — CBG MONITORING, ED
Glucose-Capillary: 404 mg/dL — ABNORMAL HIGH (ref 70–99)
Glucose-Capillary: 278 mg/dL — ABNORMAL HIGH (ref 70–99)

## 2013-04-25 LAB — COMPREHENSIVE METABOLIC PANEL
ALBUMIN: 4 g/dL (ref 3.5–5.2)
ALK PHOS: 122 U/L — AB (ref 39–117)
ALT: 16 U/L (ref 0–53)
AST: 25 U/L (ref 0–37)
BUN: 22 mg/dL (ref 6–23)
CALCIUM: 9.7 mg/dL (ref 8.4–10.5)
CO2: 27 mEq/L (ref 19–32)
Chloride: 99 mEq/L (ref 96–112)
Creatinine, Ser: 1.31 mg/dL (ref 0.50–1.35)
GFR calc Af Amer: 63 mL/min — ABNORMAL LOW (ref >90)
GFR calc non Af Amer: 55 mL/min — ABNORMAL LOW (ref >90)
Glucose, Bld: 322 mg/dL — ABNORMAL HIGH (ref 70–99)
POTASSIUM: 4.7 meq/L (ref 3.7–5.3)
Sodium: 135 mEq/L — ABNORMAL LOW (ref 137–147)
TOTAL PROTEIN: 7.5 g/dL (ref 6.0–8.3)
Total Bilirubin: 0.8 mg/dL (ref 0.3–1.2)

## 2013-04-25 MED ORDER — CLINDAMYCIN PHOSPHATE 600 MG/50ML IV SOLN
600.0000 mg | Freq: Once | INTRAVENOUS | Status: AC
  Administered 2013-04-25: 600 mg via INTRAVENOUS
  Filled 2013-04-25: qty 50

## 2013-04-25 MED ORDER — SODIUM CHLORIDE 0.9 % IV SOLN
INTRAVENOUS | Status: DC
Start: 2013-04-25 — End: 2013-04-25
  Administered 2013-04-25: 12:00:00 via INTRAVENOUS

## 2013-04-25 MED ORDER — HYDROCODONE-ACETAMINOPHEN 5-325 MG PO TABS: 1.0000 | ORAL_TABLET | ORAL | Status: DC | PRN

## 2013-04-25 MED ORDER — INSULIN ASPART 100 UNIT/ML ~~LOC~~ SOLN
10.0000 [IU] | Freq: Once | SUBCUTANEOUS | Status: AC
  Administered 2013-04-25: 10 [IU] via SUBCUTANEOUS
  Filled 2013-04-25: qty 1

## 2013-04-25 MED ORDER — OXYCODONE-ACETAMINOPHEN 5-325 MG PO TABS
1.0000 | ORAL_TABLET | Freq: Once | ORAL | Status: AC
  Administered 2013-04-25: 1 via ORAL
  Filled 2013-04-25: qty 1

## 2013-04-25 MED ORDER — CLINDAMYCIN HCL 150 MG PO CAPS: 150.0000 mg | ORAL_CAPSULE | Freq: Four times a day (QID) | ORAL | Status: DC

## 2013-04-25 NOTE — ED Notes (Signed)
Pt c/o left big toe pain since Monday. Pt states "I think it's infected". Pt denies injury. Pt has hx of diabetes.

## 2013-04-25 NOTE — Discharge Instructions (Signed)
Return in 2 days for recheck. Return sooner for any problems. Do not drive if taking the narcotic pain medication as it will make you sleepy.

## 2013-04-25 NOTE — ED Notes (Signed)
Pt states he did not take his insulin this morning.

## 2013-04-25 NOTE — ED Provider Notes (Signed)
CSN: [id number]     Arrival date & time 04/25/13  3500 History   First MD Initiated Contact with Patient 04/25/13 [phone]     Chief Complaint  Patient presents with  . Toe Pain     (Consider location/radiation/quality/duration/timing/severity/associated sxs/prior Treatment) Patient is a 68 y.o. male presenting with toe pain. The history is provided by the patient.  Toe Pain This is a new problem. The current episode started in the past 7 days. The problem occurs constantly. The problem has been gradually worsening.   EDWORD CU is a 68 y.o. male who presents to the ED with left great toe pain that started 6 days ago. There is redness, swelling and pain to the area. He is a diabetic and has not taken his medication today.  Past Medical History  Diagnosis Date  . Diabetes mellitus   . Chronic kidney disease     stones hx  . PTSD (post-traumatic stress disorder)   . GERD (gastroesophageal reflux disease)   . Chronic back pain    Past Surgical History  Procedure Laterality Date  . Hernia repair  rt ing  . Bladder surgery      02  . Back surgery      x2  . Appendectomy    . Cholecystectomy     History reviewed. No pertinent family history. History  Substance Use Topics  . Smoking status: Current Every Day Smoker -- 1.00 packs/day    Types: Cigarettes  . Smokeless tobacco: Not on file  . Alcohol Use: No    Review of Systems Negative except as stated in HPI   Allergies  Bee venom; Codeine; and Diovan  Home Medications   Current Outpatient Rx  Name  Route  Sig  Dispense  Refill  . aspirin EC 81 MG tablet   Oral   Take 81 mg by mouth daily.           [name] atorvastatin (LIPITOR) 20 MG tablet   Oral   Take 20 mg by mouth daily.           [name] losartan (COZAAR) 50 MG tablet   Oral   Take 50 mg by mouth daily.           [name] omeprazole (PRILOSEC) 20 MG capsule   Oral   Take 20 mg by mouth daily.         . Tamsulosin HCl (FLOMAX) 0.4 MG CAPS   Oral   Take 0.4  mg by mouth daily.            BP 146/71  Pulse 89  Temp(Src) 98.1 F (36.7 C) (Oral)  Resp 18  SpO2 100% Physical Exam  Nursing note and vitals reviewed. Constitutional: He is oriented to person, place, and time. He appears well-developed and well-nourished. No distress.  HENT:  Head: Normocephalic.  Eyes: Conjunctivae and EOM are normal.  Neck: Neck supple.  Cardiovascular: Normal rate.   Pulmonary/Chest: Effort normal.  Musculoskeletal: Normal range of motion.       Left foot: He exhibits tenderness and swelling.       Feet:  Pedal pulses equal, adequate circulation, good touch sensation. There is tenderness with light palpation to the left great toe. There is erythema noted.   Neurological: He is alert and oriented to person, place, and time. No cranial nerve deficit.  Skin: Skin is warm and dry.  Psychiatric: He has a normal mood and affect. His behavior is normal.   Dg Toe Great  Left  04/25/2013   CLINICAL DATA:  Left toe pain  EXAM: LEFT GREAT TOE  COMPARISON:  None.  FINDINGS: There is no evidence of fracture or dislocation. There is no evidence of arthropathy or other focal bone abnormality. Soft tissues are unremarkable.  IMPRESSION: No acute abnormality noted.   Electronically Signed   By: Inez Catalina M.D.   On: 04/25/2013 10:59   Results for orders placed during the hospital encounter of 04/25/13 (from the past 24 hour(s))  CBG MONITORING, ED     Status: Abnormal   Collection Time    04/25/13  9:51 AM      Result Value Ref Range   Glucose-Capillary 404 (*) 70 - 99 mg/dL  CBC WITH DIFFERENTIAL     Status: Abnormal   Collection Time    04/25/13 12:25 PM      Result Value Ref Range   WBC 6.1  4.0 - 10.5 K/uL   RBC 3.06 (*) 4.22 - 5.81 MIL/uL   Hemoglobin 9.7 (*) 13.0 - 17.0 g/dL   HCT 27.7 (*) 39.0 - 52.0 %   MCV 90.5  78.0 - 100.0 fL   MCH 31.7  26.0 - 34.0 pg   MCHC 35.0  30.0 - 36.0 g/dL   RDW 13.1  11.5 - 15.5 %   Platelets 254  150 - 400 K/uL    Neutrophils Relative % 67  43 - 77 %   Neutro Abs 4.1  1.7 - 7.7 K/uL   Lymphocytes Relative 26  12 - 46 %   Lymphs Abs 1.6  0.7 - 4.0 K/uL   Monocytes Relative 7  3 - 12 %   Monocytes Absolute 0.4  0.1 - 1.0 K/uL   Eosinophils Relative 1  0 - 5 %   Eosinophils Absolute 0.0  0.0 - 0.7 K/uL   Basophils Relative 1  0 - 1 %   Basophils Absolute 0.0  0.0 - 0.1 K/uL  COMPREHENSIVE METABOLIC PANEL     Status: Abnormal   Collection Time    04/25/13 12:25 PM      Result Value Ref Range   Sodium 135 (*) 137 - 147 mEq/L   Potassium 4.7  3.7 - 5.3 mEq/L   Chloride 99  96 - 112 mEq/L   CO2 27  19 - 32 mEq/L   Glucose, Bld 322 (*) 70 - 99 mg/dL   BUN 22  6 - 23 mg/dL   Creatinine, Ser 1.31  0.50 - 1.35 mg/dL   Calcium 9.7  8.4 - 10.5 mg/dL   Total Protein 7.5  6.0 - 8.3 g/dL   Albumin 4.0  3.5 - 5.2 g/dL   AST 25  0 - 37 U/L   ALT 16  0 - 53 U/L   Alkaline Phosphatase 122 (*) 39 - 117 U/L   Total Bilirubin 0.8  0.3 - 1.2 mg/dL   GFR calc non Af Amer 55 (*) >90 mL/min   GFR calc Af Amer 63 (*) >90 mL/min  CBG MONITORING, ED     Status: Abnormal   Collection Time    04/25/13  1:44 PM      Result Value Ref Range   Glucose-Capillary 278 (*) 70 - 99 mg/dL   After IV fluids antibiotics, insulin and pain medication and lunch tray patient is feeling better and states he is ready to go home. States no pain at this time.   ED Course: dr. Alvino Chapel in to examine the patient. Will give IV  Clindamycin and then Rx of Clindamycin for home.   Procedures MDM  68 y.o. male with swelling, erythema and pain to the left great toe x 3 days. Elevated blood sugar that has improved with treatment in the ED. First dose of IV antibiotics given in the ED. Stable for discharge with PO antibiotics, pain management and follow up in 2 days. I have reviewed this patient's vital signs, nurses notes, appropriate labs and imaging.  I have discussed findings and plan of care with the patient and he voices understanding. He  will return sooner for any problems.     [name], [place] 04/25/13 [phone]

## 2013-04-26 NOTE — ED Provider Notes (Signed)
Medical screening examination/treatment/procedure(s) were performed by non-physician practitioner and as supervising physician I was immediately available for consultation/collaboration.   EKG Interpretation None       Morganne Haile R. Riyansh Gerstner, MD 04/26/13 0716 

## 2013-05-20 DIAGNOSIS — M79609 Pain in unspecified limb: Secondary | ICD-10-CM | POA: Diagnosis not present

## 2013-05-20 DIAGNOSIS — L600 Ingrowing nail: Secondary | ICD-10-CM | POA: Diagnosis not present

## 2013-06-03 DIAGNOSIS — L600 Ingrowing nail: Secondary | ICD-10-CM | POA: Diagnosis not present

## 2013-06-03 DIAGNOSIS — M79609 Pain in unspecified limb: Secondary | ICD-10-CM | POA: Diagnosis not present

## 2013-07-01 DIAGNOSIS — E1149 Type 2 diabetes mellitus with other diabetic neurological complication: Secondary | ICD-10-CM | POA: Diagnosis not present

## 2013-07-01 DIAGNOSIS — E119 Type 2 diabetes mellitus without complications: Secondary | ICD-10-CM | POA: Diagnosis not present

## 2013-07-10 DIAGNOSIS — E785 Hyperlipidemia, unspecified: Secondary | ICD-10-CM | POA: Diagnosis not present

## 2013-07-10 DIAGNOSIS — E1129 Type 2 diabetes mellitus with other diabetic kidney complication: Secondary | ICD-10-CM | POA: Diagnosis not present

## 2013-07-10 DIAGNOSIS — E119 Type 2 diabetes mellitus without complications: Secondary | ICD-10-CM | POA: Diagnosis not present

## 2013-07-10 DIAGNOSIS — I10 Essential (primary) hypertension: Secondary | ICD-10-CM | POA: Diagnosis not present

## 2013-08-19 DIAGNOSIS — K4090 Unilateral inguinal hernia, without obstruction or gangrene, not specified as recurrent: Secondary | ICD-10-CM | POA: Diagnosis not present

## 2013-09-12 ENCOUNTER — Encounter (HOSPITAL_COMMUNITY): Payer: Self-pay | Admitting: Emergency Medicine

## 2013-09-12 ENCOUNTER — Emergency Department (HOSPITAL_COMMUNITY)
Admission: EM | Admit: 2013-09-12 | Discharge: 2013-09-12 | Disposition: A | Discharge status: Home / Self Care | Payer: Medicare Other | Attending: Emergency Medicine | Admitting: Emergency Medicine

## 2013-09-12 DIAGNOSIS — Z8669 Personal history of other diseases of the nervous system and sense organs: Secondary | ICD-10-CM | POA: Diagnosis not present

## 2013-09-12 DIAGNOSIS — K219 Gastro-esophageal reflux disease without esophagitis: Secondary | ICD-10-CM | POA: Insufficient documentation

## 2013-09-12 DIAGNOSIS — E119 Type 2 diabetes mellitus without complications: Secondary | ICD-10-CM | POA: Diagnosis not present

## 2013-09-12 DIAGNOSIS — N189 Chronic kidney disease, unspecified: Secondary | ICD-10-CM | POA: Diagnosis not present

## 2013-09-12 DIAGNOSIS — Z79899 Other long term (current) drug therapy: Secondary | ICD-10-CM | POA: Diagnosis not present

## 2013-09-12 DIAGNOSIS — Z7982 Long term (current) use of aspirin: Secondary | ICD-10-CM | POA: Insufficient documentation

## 2013-09-12 DIAGNOSIS — G8929 Other chronic pain: Secondary | ICD-10-CM | POA: Insufficient documentation

## 2013-09-12 DIAGNOSIS — Z794 Long term (current) use of insulin: Secondary | ICD-10-CM | POA: Insufficient documentation

## 2013-09-12 DIAGNOSIS — F17200 Nicotine dependence, unspecified, uncomplicated: Secondary | ICD-10-CM | POA: Insufficient documentation

## 2013-09-12 DIAGNOSIS — R109 Unspecified abdominal pain: Secondary | ICD-10-CM | POA: Diagnosis not present

## 2013-09-12 DIAGNOSIS — K4091 Unilateral inguinal hernia, without obstruction or gangrene, recurrent: Secondary | ICD-10-CM

## 2013-09-12 HISTORY — DX: Schizophrenia, unspecified: F20.9

## 2013-09-12 MED ORDER — HERNIA SUPPORT LEFT MEDIUM MISC: Status: DC

## 2013-09-12 NOTE — Discharge Instructions (Signed)
Inguinal Hernia, Adult °Muscles help keep everything in the body in its proper place. But if a weak spot in the muscles develops, something can poke through. That is called a hernia. When this happens in the lower part of the belly (abdomen), it is called an inguinal hernia. (It takes its name from a part of the body in this region called the inguinal canal.) A weak spot in the wall of muscles lets some fat or part of the small intestine bulge through. An inguinal hernia can develop at any age. Men get them more often than women. °CAUSES  °In adults, an inguinal hernia develops over time. °· It can be triggered by: °¨ Suddenly straining the muscles of the lower abdomen. °¨ Lifting heavy objects. °¨ Straining to have a bowel movement. Difficult bowel movements (constipation) can lead to this. °¨ Constant coughing. This may be caused by smoking or lung disease. °¨ Being overweight. °¨ Being pregnant. °¨ Working at a job that requires long periods of standing or heavy lifting. °¨ Having had an inguinal hernia before. °One type can be an emergency situation. It is called a strangulated inguinal hernia. It develops if part of the small intestine slips through the weak spot and cannot get back into the abdomen. The blood supply can be cut off. If that happens, part of the intestine may die. This situation requires emergency surgery. °SYMPTOMS  °Often, a small inguinal hernia has no symptoms. It is found when a healthcare provider does a physical exam. Larger hernias usually have symptoms.  °· In adults, symptoms may include: °¨ A lump in the groin. This is easier to see when the person is standing. It might disappear when lying down. °¨ In men, a lump in the scrotum. °¨ Pain or burning in the groin. This occurs especially when lifting, straining or coughing. °¨ A dull ache or feeling of pressure in the groin. °· Signs of a strangulated hernia can include: °¨ A bulge in the groin that becomes very painful and tender to the  touch. °¨ A bulge that turns red or purple. °¨ Fever, nausea and vomiting. °¨ Inability to have a bowel movement or to pass gas. °DIAGNOSIS  °To decide if you have an inguinal hernia, a healthcare provider will probably do a physical examination. °· This will include asking questions about any symptoms you have noticed. °· The healthcare provider might feel the groin area and ask you to cough. If an inguinal hernia is felt, the healthcare provider may try to slide it back into the abdomen. °· Usually no other tests are needed. °TREATMENT  °Treatments can vary. The size of the hernia makes a difference. Options include: °· Watchful waiting. This is often suggested if the hernia is small and you have had no symptoms. °¨ No medical procedure will be done unless symptoms develop. °¨ You will need to watch closely for symptoms. If any occur, contact your healthcare provider right away. °· Surgery. This is used if the hernia is larger or you have symptoms. °¨ Open surgery. This is usually an outpatient procedure (you will not stay overnight in a hospital). An cut (incision) is made through the skin in the groin. The hernia is put back inside the abdomen. The weak area in the muscles is then repaired by herniorrhaphy or hernioplasty. Herniorrhaphy: in this type of surgery, the weak muscles are sewn back together. Hernioplasty: a patch or mesh is used to close the weak area in the abdominal wall. °¨ Laparoscopy.   In this procedure, a surgeon makes small incisions. A thin tube with a tiny video camera (called a laparoscope) is put into the abdomen. The surgeon repairs the hernia with mesh by looking with the video camera and using two long instruments. °HOME CARE INSTRUCTIONS  °· After surgery to repair an inguinal hernia: °¨ You will need to take pain medicine prescribed by your healthcare provider. Follow all directions carefully. °¨ You will need to take care of the wound from the incision. °¨ Your activity will be  restricted for awhile. This will probably include no heavy lifting for several weeks. You also should not do anything too active for a few weeks. When you can return to work will depend on the type of job that you have. °· During "watchful waiting" periods, you should: °¨ Maintain a healthy weight. °¨ Eat a diet high in fiber (fruits, vegetables and whole grains). °¨ Drink plenty of fluids to avoid constipation. This means drinking enough water and other liquids to keep your urine clear or pale yellow. °¨ Do not lift heavy objects. °¨ Do not stand for long periods of time. °¨ Quit smoking. This should keep you from developing a frequent cough. °SEEK MEDICAL CARE IF:  °· A bulge develops in your groin area. °· You feel pain, a burning sensation or pressure in the groin. This might be worse if you are lifting or straining. °· You develop a fever of more than 100.5° F (38.1° C). °SEEK IMMEDIATE MEDICAL CARE IF:  °· Pain in the groin increases suddenly. °· A bulge in the groin gets bigger suddenly and does not go down. °· For men, there is sudden pain in the scrotum. Or, the size of the scrotum increases. °· A bulge in the groin area becomes red or purple and is painful to touch. °· You have nausea or vomiting that does not go away. °· You feel your heart beating much faster than normal. °· You cannot have a bowel movement or pass gas. °· You develop a fever of more than 102.0° F (38.9° C). °Document Released: 05/27/2008 Document Revised: 04/02/2011 Document Reviewed: 05/27/2008 °ExitCare® Patient Information ©2015 [employer]. This information is not intended to replace advice given to you by your health care provider. Make sure you discuss any questions you have with your health care provider. ° °

## 2013-09-12 NOTE — ED Notes (Signed)
Pt states that he has hernia to right groin area that "popped out this am" is unable to move hernia back into place,

## 2013-09-13 NOTE — ED Provider Notes (Signed)
CSN: [id number]     Arrival date & time 09/12/13  1232 History   First MD Initiated Contact with Patient 09/12/13 1319     Chief Complaint  Patient presents with  . Groin Pain     (Consider location/radiation/quality/duration/timing/severity/associated sxs/prior Treatment) Patient is a 68 y.o. male presenting with groin pain. The history is provided by the patient.  Groin Pain This is a recurrent problem. Associated symptoms include abdominal pain. Pertinent negatives include no chest pain, no headaches and no shortness of breath.   patient has a known left inguinal hernia. He has an appointment to see [facility] [place]. States that starting last night the hernia would not go back and became more painful. In mild nausea no vomiting. He states since arrival to the ER it has gone back in. No constipation. He still passing gas. No fevers.  Past Medical History  Diagnosis Date  . Diabetes mellitus   . Chronic kidney disease     stones hx  . PTSD (post-traumatic stress disorder)   . GERD (gastroesophageal reflux disease)   . Chronic back pain   . Schizophrenia    Past Surgical History  Procedure Laterality Date  . Hernia repair  rt ing  . Bladder surgery      02  . Back surgery      x2  . Appendectomy    . Cholecystectomy     No family history on file. History  Substance Use Topics  . Smoking status: Current Every Day Smoker -- 1.00 packs/day    Types: Cigarettes  . Smokeless tobacco: Not on file  . Alcohol Use: No    Review of Systems  Constitutional: Negative for activity change and appetite change.  Eyes: Negative for pain.  Respiratory: Negative for shortness of breath.   Cardiovascular: Negative for chest pain.  Gastrointestinal: Positive for nausea and abdominal pain. Negative for vomiting and diarrhea.  Genitourinary: Negative for flank pain, discharge, scrotal swelling and testicular pain.  Musculoskeletal: Negative for back pain.  Skin: Negative for rash.   Neurological: Negative for headaches.      Allergies  Bee venom; Codeine; and Diovan  Home Medications   Prior to Admission medications   Medication Sig Start Date End Date Taking? Authorizing Provider  aspirin EC 81 MG tablet Take 81 mg by mouth daily.      Historical Provider, MD  atorvastatin (LIPITOR) 20 MG tablet Take 20 mg by mouth daily.      Historical Provider, MD  clindamycin (CLEOCIN) 150 MG capsule Take 1 capsule (150 mg total) by mouth every 6 (six) hours. 04/25/13   Hope Bunnie Pion, NP  Elastic Bandages & Supports (HERNIA SUPPORT LEFT MEDIUM) MISC Wear for hernia control 09/12/13   Jasper Riling. Torin Whisner, MD  HYDROcodone-acetaminophen (NORCO/VICODIN) 5-325 MG per tablet Take 1-2 tablets by mouth every 4 (four) hours as needed. 04/25/13   Hope Bunnie Pion, NP  insulin lispro (HUMALOG) 100 UNIT/ML injection Inject 10 Units into the skin 3 (three) times daily before meals.    Historical Provider, MD  insulin NPH-regular Human (NOVOLIN 70/30) (70-30) 100 UNIT/ML injection Inject 70 Units into the skin at bedtime.    Historical Provider, MD  losartan (COZAAR) 50 MG tablet Take 50 mg by mouth daily.      Historical Provider, MD  omeprazole (PRILOSEC) 20 MG capsule Take 20 mg by mouth daily.    Historical Provider, MD  Tamsulosin HCl (FLOMAX) 0.4 MG CAPS Take 0.4 mg by mouth daily.  Historical Provider, MD   BP 153/93  Pulse 88  Temp(Src) 97.7 F (36.5 C) (Oral)  Resp 20  Ht 6\' 1"  (1.854 m)  Wt 167 lb (75.751 kg)  BMI 22.04 kg/m2  SpO2 100% Physical Exam  Nursing note and vitals reviewed. Constitutional: He is oriented to person, place, and time. He appears well-developed and well-nourished.  HENT:  Head: Normocephalic and atraumatic.  Cardiovascular: Normal rate, regular rhythm and normal heart sounds.   No murmur heard. Pulmonary/Chest: Effort normal and breath sounds normal.  Abdominal: Soft. Bowel sounds are normal. He exhibits no distension and no mass. There is tenderness.  There is no rebound and no guarding.  Reducible inguinal hernia in left groin. Mild tenderness at site, even with reduction of hernia  Musculoskeletal: Normal range of motion. He exhibits no edema.  Neurological: He is alert and oriented to person, place, and time. No cranial nerve deficit.  Skin: Skin is warm and dry.  Psychiatric: He has a normal mood and affect.    ED Course  Procedures (including critical care time) Labs Review Labs Reviewed - No data to display  Imaging Review No results found.   EKG Interpretation None      MDM   Final diagnoses:  Unilateral recurrent inguinal hernia without obstruction or gangrene    Patient inguinal hernia. Was not reducible at home was reduced here. He is surgical followup. Will discharge home.    Jasper Riling. Alvino Chapel, [place] 09/13/13 [phone]

## 2013-09-16 ENCOUNTER — Ambulatory Visit (INDEPENDENT_AMBULATORY_CARE_PROVIDER_SITE_OTHER): Payer: Medicare Other | Admitting: General Surgery

## 2013-09-16 ENCOUNTER — Encounter (INDEPENDENT_AMBULATORY_CARE_PROVIDER_SITE_OTHER): Payer: Self-pay | Admitting: General Surgery

## 2013-09-16 VITALS — BP 132/70 | HR 81 | Temp 98.0°F | Ht 73.0 in | Wt 159.0 lb

## 2013-09-16 DIAGNOSIS — K4090 Unilateral inguinal hernia, without obstruction or gangrene, not specified as recurrent: Secondary | ICD-10-CM | POA: Diagnosis not present

## 2013-09-16 NOTE — Patient Instructions (Signed)
Our office will call you to schedule surgery Continue to take flomax including the morning of surgery Try to stop smoking

## 2013-09-18 NOTE — Progress Notes (Signed)
Patient ID: Allen Yu, male   DOB: Jun 07, 1945, 68 y.o.   MRN: [id number]  Chief Complaint  Patient presents with  . eval hernia    HPI Allen Yu is a 68 y.o. male.   HPI 68 year old African American male referred by Dr. [name] for evaluation of a left inguinal hernia. He underwent an open right inguinal hernia repair by me in 2000 and. He states a few weeks ago he was moving a grill and he felt something pop on his left side. He states the bulge comes and goes. He states it does cause him some discomfort and pressure. He also describes a burning sensation. He denies any nausea or vomiting diarrhea or constipation. He continues to smoke one pack per day. He already takes flomax Past Medical History  Diagnosis Date  . Diabetes mellitus   . Chronic kidney disease     stones hx  . PTSD (post-traumatic stress disorder)   . GERD (gastroesophageal reflux disease)   . Chronic back pain   . Schizophrenia     Past Surgical History  Procedure Laterality Date  . Hernia repair  rt ing  . Bladder surgery      02  . Back surgery      x2  . Appendectomy    . Cholecystectomy      History reviewed. No pertinent family history.  Social History History  Substance Use Topics  . Smoking status: Current Every Day Smoker -- 1.00 packs/day    Types: Cigarettes  . Smokeless tobacco: Not on file  . Alcohol Use: No    Allergies  Allergen Reactions  . Bee Venom Other (See Comments)    Unknown   . Codeine Other (See Comments)    incoherent   . Diovan [Valsartan] Other (See Comments)    incoherent    Current Outpatient Prescriptions  Medication Sig Dispense Refill  . aspirin EC 81 MG tablet Take 81 mg by mouth daily.        [name] atorvastatin (LIPITOR) 20 MG tablet Take 20 mg by mouth daily.        . clindamycin (CLEOCIN) 150 MG capsule Take 1 capsule (150 mg total) by mouth every 6 (six) hours.  28 capsule  0  . Elastic Bandages & Supports (HERNIA SUPPORT LEFT MEDIUM) MISC Wear  for hernia control  1 each  1  . HYDROcodone-acetaminophen (NORCO/VICODIN) 5-325 MG per tablet Take 1-2 tablets by mouth every 4 (four) hours as needed.  20 tablet  0  . insulin lispro (HUMALOG) 100 UNIT/ML injection Inject 10 Units into the skin 3 (three) times daily before meals.      . insulin NPH-regular Human (NOVOLIN 70/30) (70-30) 100 UNIT/ML injection Inject 70 Units into the skin at bedtime.      [name] losartan (COZAAR) 50 MG tablet Take 50 mg by mouth daily.        [name] omeprazole (PRILOSEC) 20 MG capsule Take 20 mg by mouth daily.      . Tamsulosin HCl (FLOMAX) 0.4 MG CAPS Take 0.4 mg by mouth daily.         No current facility-administered medications for this visit.   Facility-Administered Medications Ordered in Other Visits  Medication Dose Route Frequency Provider Last Rate Last Dose  . ondansetron (ZOFRAN) 4 mg in sodium chloride 0.9 % 50 mL IVPB  4 mg Intravenous Once Ashok Pall, MD        Review of Systems Review of Systems  Constitutional: Negative  for fever, chills, appetite change and unexpected weight change.  HENT: Negative for congestion and trouble swallowing.   Eyes: Negative for visual disturbance.  Respiratory: Negative for chest tightness and shortness of breath.   Cardiovascular: Negative for chest pain and leg swelling.       No PND, no orthopnea, no DOE  Gastrointestinal:       See HPI  Genitourinary: Negative for dysuria and hematuria.       Already on Flomax  Musculoskeletal: Negative.   Skin: Negative for rash.  Neurological: Negative for seizures and speech difficulty.  Hematological: Does not bruise/bleed easily.  Psychiatric/Behavioral: Negative for behavioral problems and confusion.    Blood pressure 132/70, pulse 81, temperature 98 F (36.7 C), height 6\' 1"  (1.854 m), weight 159 lb (72.122 kg).  Physical Exam Physical Exam  Vitals reviewed. Constitutional: He is oriented to person, place, and time. He appears well-developed and  well-nourished. No distress.  HENT:  Head: Normocephalic and atraumatic.  Right Ear: External ear normal.  Left Ear: External ear normal.  Eyes: Conjunctivae are normal. No scleral icterus.  Muddy sclera; ?cataracts  Neck: Normal range of motion. Neck supple. No tracheal deviation present. No thyromegaly present.  Cardiovascular: Normal rate, normal heart sounds and intact distal pulses.   Pulmonary/Chest: Effort normal and breath sounds normal. No respiratory distress. He has no wheezes.  Abdominal: Soft. He exhibits no distension. There is no tenderness. There is no rebound and no guarding. A hernia is present. Hernia confirmed positive in the left inguinal area. Hernia confirmed negative in the right inguinal area.  Genitourinary: Testes normal.     Obvious left groin bulge, small, easily reducible  Musculoskeletal: Normal range of motion. He exhibits no edema and no tenderness.  Lymphadenopathy:    He has no cervical adenopathy.  Neurological: He is alert and oriented to person, place, and time. He exhibits normal muscle tone.  Skin: Skin is warm and dry. No rash noted. He is not diaphoretic. No erythema. No pallor.  Psychiatric: He has a normal mood and affect. His behavior is normal. Judgment and thought content normal.    Data Reviewed Dr Willey Blade office note My op note  Assessment    Left inguinal hernia     Plan    We discussed the etiology of inguinal hernias. We discussed the signs & symptoms of incarceration & strangulation.  We discussed non-operative and operative management.  The patient has elected to proceed with OPEN LEFT INGUINAL HERNIA REPAIR WITH MESH   I described the procedure in detail.  The patient was given educational material. We discussed the risks and benefits including but not limited to bleeding, infection, chronic inguinal pain, nerve entrapment, hernia recurrence, mesh complications, hematoma formation, urinary retention, injury to the testicles,  numbness in the groin, blood clots, injury to the surrounding structures, and anesthesia risk. We also discussed the typical post operative recovery course, including no heavy lifting for 4-6 weeks. I explained that the likelihood of improvement of their symptoms is good   Our office will contact hime to schedule surgery  Leighton Ruff. Redmond Pulling, MD, FACS General, Bariatric, & Minimally Invasive Surgery [facility], [place]        [facility] M 09/18/2013, 9:32 AM

## 2013-09-23 DIAGNOSIS — N400 Enlarged prostate without lower urinary tract symptoms: Secondary | ICD-10-CM | POA: Diagnosis not present

## 2013-10-08 DIAGNOSIS — E119 Type 2 diabetes mellitus without complications: Secondary | ICD-10-CM | POA: Diagnosis not present

## 2013-10-15 DIAGNOSIS — Z23 Encounter for immunization: Secondary | ICD-10-CM | POA: Diagnosis not present

## 2013-10-15 DIAGNOSIS — I10 Essential (primary) hypertension: Secondary | ICD-10-CM | POA: Diagnosis not present

## 2013-10-15 DIAGNOSIS — E1129 Type 2 diabetes mellitus with other diabetic kidney complication: Secondary | ICD-10-CM | POA: Diagnosis not present

## 2013-10-26 ENCOUNTER — Other Ambulatory Visit (HOSPITAL_COMMUNITY): Payer: Self-pay | Admitting: *Deleted

## 2013-10-26 NOTE — Pre-Procedure Instructions (Signed)
Allen Yu  10/26/2013   Your procedure is scheduled on:  Tuesday, November 03, 2013 at 10:00 AM.   Report to [facility] Entrance "A" Admitting Office at 8:00 AM.   Call this number if you have problems the morning of surgery: [phone]   Remember:   Do not eat food or drink liquids after midnight Monday, 11/02/13.   Take these medicines the morning of surgery with A SIP OF WATER: OMEPRAZOLE (PRILOSEC),  FLOMAX                  (STOP ASPIRIN AND ADVIL COUMADIN, PLAVIX, EFFIENT, HERBAL MEDICINES)  Do not wear jewelry.  Do not wear lotions, powders, or cologne. You may wear deodorant.  Men may shave face and neck.  Do not bring valuables to the hospital.  [facility] is not responsible                  for any belongings or valuables.               Contacts, dentures or bridgework may not be worn into surgery.  Leave suitcase in the car. After surgery it may be brought to your room.  For patients admitted to the hospital, discharge time is determined by your                treatment team.               Patients discharged the day of surgery will not be allowed to drive home.    Special Instructions: [facility] - Preparing for Surgery  Before surgery, you can play an important role.  Because skin is not sterile, your skin needs to be as free of germs as possible.  You can reduce the number of germs on you skin by washing with CHG (chlorahexidine gluconate) soap before surgery.  CHG is an antiseptic cleaner which kills germs and bonds with the skin to continue killing germs even after washing.  Please DO NOT use if you have an allergy to CHG or antibacterial soaps.  If your skin becomes reddened/irritated stop using the CHG and inform your nurse when you arrive at Short Stay.  Do not shave (including legs and underarms) for at least 48 hours prior to the first CHG shower.  You may shave your face.  Please follow these instructions carefully:   1.  Shower with CHG Soap  the night before surgery and the                                morning of Surgery.  2.  If you choose to wash your hair, wash your hair first as usual with your       normal shampoo.  3.  After you shampoo, rinse your hair and body thoroughly to remove the                      Shampoo.  4.  Use CHG as you would any other liquid soap.  You can apply chg directly       to the skin and wash gently with scrungie or a clean washcloth.  5.  Apply the CHG Soap to your body ONLY FROM THE NECK DOWN.        Do not use on open wounds or open sores.  Avoid contact with your eyes, ears, mouth and genitals (private parts).  Wash genitals (private parts) with your normal soap.  6.  Wash thoroughly, paying special attention to the area where your surgery        will be performed.  7.  Thoroughly rinse your body with warm water from the neck down.  8.  DO NOT shower/wash with your normal soap after using and rinsing off       the CHG Soap.  9.  Pat yourself dry with a clean towel.            10.  Wear clean pajamas.            11.  Place clean sheets on your bed the night of your first shower and do not        sleep with pets.  Day of Surgery  Do not apply any lotions the morning of surgery.  Please wear clean clothes to the hospital/surgery center.     Please read over the following fact sheets that you were given: Pain Booklet, Coughing and Deep Breathing and Surgical Site Infection Prevention

## 2013-10-27 ENCOUNTER — Encounter (HOSPITAL_COMMUNITY)
Admission: RE | Admit: 2013-10-27 | Discharge: 2013-10-27 | Disposition: A | Discharge status: Home / Self Care | Payer: Medicare Other | Source: Ambulatory Visit | Attending: General Surgery | Admitting: General Surgery

## 2013-10-27 ENCOUNTER — Encounter (HOSPITAL_COMMUNITY): Payer: Self-pay

## 2013-10-27 ENCOUNTER — Encounter (HOSPITAL_COMMUNITY): Payer: Self-pay | Admitting: Pharmacy Technician

## 2013-10-27 DIAGNOSIS — K4090 Unilateral inguinal hernia, without obstruction or gangrene, not specified as recurrent: Secondary | ICD-10-CM | POA: Insufficient documentation

## 2013-10-27 DIAGNOSIS — K219 Gastro-esophageal reflux disease without esophagitis: Secondary | ICD-10-CM | POA: Insufficient documentation

## 2013-10-27 DIAGNOSIS — Z0000 Encounter for general adult medical examination without abnormal findings: Secondary | ICD-10-CM | POA: Diagnosis not present

## 2013-10-27 DIAGNOSIS — I129 Hypertensive chronic kidney disease with stage 1 through stage 4 chronic kidney disease, or unspecified chronic kidney disease: Secondary | ICD-10-CM | POA: Insufficient documentation

## 2013-10-27 DIAGNOSIS — N189 Chronic kidney disease, unspecified: Secondary | ICD-10-CM | POA: Insufficient documentation

## 2013-10-27 DIAGNOSIS — F4310 Post-traumatic stress disorder, unspecified: Secondary | ICD-10-CM | POA: Insufficient documentation

## 2013-10-27 DIAGNOSIS — Z7982 Long term (current) use of aspirin: Secondary | ICD-10-CM | POA: Diagnosis not present

## 2013-10-27 DIAGNOSIS — E119 Type 2 diabetes mellitus without complications: Secondary | ICD-10-CM | POA: Diagnosis not present

## 2013-10-27 DIAGNOSIS — Z794 Long term (current) use of insulin: Secondary | ICD-10-CM | POA: Insufficient documentation

## 2013-10-27 DIAGNOSIS — F209 Schizophrenia, unspecified: Secondary | ICD-10-CM | POA: Diagnosis not present

## 2013-10-27 HISTORY — DX: Essential (primary) hypertension: I10

## 2013-10-27 HISTORY — DX: Unspecified osteoarthritis, unspecified site: M19.90

## 2013-10-27 HISTORY — DX: Respiratory tuberculosis unspecified: A15.9

## 2013-10-27 HISTORY — DX: Polyneuropathy, unspecified: G62.9

## 2013-10-27 LAB — COMPREHENSIVE METABOLIC PANEL
ALBUMIN: 4 g/dL (ref 3.5–5.2)
ALT: 16 U/L (ref 0–53)
ANION GAP: 10 (ref 5–15)
AST: 20 U/L (ref 0–37)
Alkaline Phosphatase: 122 U/L — ABNORMAL HIGH (ref 39–117)
BUN: 15 mg/dL (ref 6–23)
CHLORIDE: 101 meq/L (ref 96–112)
CO2: 25 meq/L (ref 19–32)
CREATININE: 1.21 mg/dL (ref 0.50–1.35)
Calcium: 9.6 mg/dL (ref 8.4–10.5)
GFR calc Af Amer: 70 mL/min — ABNORMAL LOW (ref >90)
GFR, EST NON AFRICAN AMERICAN: 60 mL/min — AB (ref >90)
Glucose, Bld: 407 mg/dL — ABNORMAL HIGH (ref 70–99)
POTASSIUM: 4.7 meq/L (ref 3.7–5.3)
Sodium: 136 mEq/L — ABNORMAL LOW (ref 137–147)
Total Bilirubin: 0.8 mg/dL (ref 0.3–1.2)
Total Protein: 7 g/dL (ref 6.0–8.3)

## 2013-10-27 LAB — CBC WITH DIFFERENTIAL/PLATELET
BASOS PCT: 1 % (ref 0–1)
Basophils Absolute: 0 10*3/uL (ref 0.0–0.1)
EOS PCT: 2 % (ref 0–5)
Eosinophils Absolute: 0.1 10*3/uL (ref 0.0–0.7)
HEMATOCRIT: 30.1 % — AB (ref 39.0–52.0)
Hemoglobin: 10.5 g/dL — ABNORMAL LOW (ref 13.0–17.0)
LYMPHS PCT: 26 % (ref 12–46)
Lymphs Abs: 1.6 10*3/uL (ref 0.7–4.0)
MCH: 31.3 pg (ref 26.0–34.0)
MCHC: 34.9 g/dL (ref 30.0–36.0)
MCV: 89.6 fL (ref 78.0–100.0)
MONO ABS: 0.4 10*3/uL (ref 0.1–1.0)
Monocytes Relative: 7 % (ref 3–12)
Neutro Abs: 3.9 10*3/uL (ref 1.7–7.7)
Neutrophils Relative %: 64 % (ref 43–77)
Platelets: 217 10*3/uL (ref 150–400)
RBC: 3.36 MIL/uL — ABNORMAL LOW (ref 4.22–5.81)
RDW: 13.3 % (ref 11.5–15.5)
WBC: 6.1 10*3/uL (ref 4.0–10.5)

## 2013-10-28 NOTE — Progress Notes (Addendum)
Anesthesia Chart Review:  Pt is 68 year old male scheduled for inguinal hernia repair on 11/03/13 with Dr. Redmond Pulling.   PMH: HTN, DM, chronic kidney disease, schizophrenia, PTSD, TB, GERD  Medications include: insulin, losartan, ASA, flomax, prilosec.   Preoperative labs reviewed.  Blood glucose 407. Most recent HgbA1C was 7.5 on 07/10/2013.   Hgb/hct 10.5/30.1 which are stable and consistent with prior results.   Chest x-ray 11/17/2012 reviewed. No active cardiopulmonary disease.  EKG 11/18/2012: SR with PAC's with aberrant conduction.   Called and left message on patient's voicemail to discuss usual blood sugar readings at home.   Willeen Cass, FNP-BC [facility] Short Stay Surgical Center/Anesthesiology Phone: [phone] 10/28/2013 4:56 PM  Addendum:   Called and left 2nd message on pt's voicemail to discuss usual blood sugar readings at home. Pt has not returned my calls. Given his HgbA1C from June 2015 is not consistent with a blood glucose of 400, it's possible pt arrived to PAT directly after eating and/or not taking his meds.  Will recheck blood sugar DOS.   Willeen Cass, FNP-BC [facility] Short Stay Surgical Center/Anesthesiology Phone: [phone] 10/30/2013 4:05 PM

## 2013-11-02 MED ORDER — CEFAZOLIN SODIUM-DEXTROSE 2-3 GM-% IV SOLR
2.0000 g | INTRAVENOUS | Status: AC
  Administered 2013-11-03: 2 g via INTRAVENOUS
  Filled 2013-11-02: qty 50

## 2013-11-03 ENCOUNTER — Ambulatory Visit (HOSPITAL_COMMUNITY): Payer: Medicare Other | Admitting: Anesthesiology

## 2013-11-03 ENCOUNTER — Encounter (HOSPITAL_COMMUNITY): Payer: Medicare Other | Admitting: Emergency Medicine

## 2013-11-03 ENCOUNTER — Encounter (HOSPITAL_COMMUNITY): Payer: Self-pay | Admitting: *Deleted

## 2013-11-03 ENCOUNTER — Encounter (HOSPITAL_COMMUNITY)
Admission: RE | Disposition: A | Discharge status: Home / Self Care | Payer: Self-pay | Source: Ambulatory Visit | Attending: General Surgery

## 2013-11-03 ENCOUNTER — Ambulatory Visit (HOSPITAL_COMMUNITY)
Admission: RE | Admit: 2013-11-03 | Discharge: 2013-11-03 | Disposition: A | Discharge status: Home / Self Care | Payer: Medicare Other | Source: Ambulatory Visit | Attending: General Surgery | Admitting: General Surgery

## 2013-11-03 DIAGNOSIS — Z91030 Bee allergy status: Secondary | ICD-10-CM | POA: Diagnosis not present

## 2013-11-03 DIAGNOSIS — M549 Dorsalgia, unspecified: Secondary | ICD-10-CM | POA: Diagnosis not present

## 2013-11-03 DIAGNOSIS — F209 Schizophrenia, unspecified: Secondary | ICD-10-CM | POA: Insufficient documentation

## 2013-11-03 DIAGNOSIS — N189 Chronic kidney disease, unspecified: Secondary | ICD-10-CM | POA: Diagnosis not present

## 2013-11-03 DIAGNOSIS — K219 Gastro-esophageal reflux disease without esophagitis: Secondary | ICD-10-CM | POA: Insufficient documentation

## 2013-11-03 DIAGNOSIS — Z794 Long term (current) use of insulin: Secondary | ICD-10-CM | POA: Insufficient documentation

## 2013-11-03 DIAGNOSIS — E119 Type 2 diabetes mellitus without complications: Secondary | ICD-10-CM | POA: Insufficient documentation

## 2013-11-03 DIAGNOSIS — M1990 Unspecified osteoarthritis, unspecified site: Secondary | ICD-10-CM | POA: Diagnosis not present

## 2013-11-03 DIAGNOSIS — G629 Polyneuropathy, unspecified: Secondary | ICD-10-CM | POA: Insufficient documentation

## 2013-11-03 DIAGNOSIS — F4310 Post-traumatic stress disorder, unspecified: Secondary | ICD-10-CM | POA: Diagnosis not present

## 2013-11-03 DIAGNOSIS — Z79899 Other long term (current) drug therapy: Secondary | ICD-10-CM | POA: Diagnosis not present

## 2013-11-03 DIAGNOSIS — G8929 Other chronic pain: Secondary | ICD-10-CM | POA: Diagnosis not present

## 2013-11-03 DIAGNOSIS — F17210 Nicotine dependence, cigarettes, uncomplicated: Secondary | ICD-10-CM | POA: Diagnosis not present

## 2013-11-03 DIAGNOSIS — I129 Hypertensive chronic kidney disease with stage 1 through stage 4 chronic kidney disease, or unspecified chronic kidney disease: Secondary | ICD-10-CM | POA: Diagnosis not present

## 2013-11-03 DIAGNOSIS — K4090 Unilateral inguinal hernia, without obstruction or gangrene, not specified as recurrent: Secondary | ICD-10-CM | POA: Diagnosis not present

## 2013-11-03 DIAGNOSIS — Z7982 Long term (current) use of aspirin: Secondary | ICD-10-CM | POA: Diagnosis not present

## 2013-11-03 DIAGNOSIS — Z885 Allergy status to narcotic agent status: Secondary | ICD-10-CM | POA: Insufficient documentation

## 2013-11-03 HISTORY — PX: INGUINAL HERNIA REPAIR: SHX194

## 2013-11-03 LAB — GLUCOSE, CAPILLARY
Glucose-Capillary: 223 mg/dL — ABNORMAL HIGH (ref 70–99)
Glucose-Capillary: 236 mg/dL — ABNORMAL HIGH (ref 70–99)

## 2013-11-03 SURGERY — REPAIR, HERNIA, INGUINAL, ADULT
Anesthesia: General | Site: Abdomen | Laterality: Left

## 2013-11-03 MED ORDER — CHLORHEXIDINE GLUCONATE 4 % EX LIQD
1.0000 "application " | Freq: Once | CUTANEOUS | Status: DC
  Filled 2013-11-03: qty 15

## 2013-11-03 MED ORDER — MORPHINE SULFATE 2 MG/ML IJ SOLN: 1.0000 mg | INTRAMUSCULAR | Status: DC | PRN

## 2013-11-03 MED ORDER — ONDANSETRON HCL 4 MG/2ML IJ SOLN
INTRAMUSCULAR | Status: AC
  Filled 2013-11-03: qty 2

## 2013-11-03 MED ORDER — FENTANYL CITRATE 0.05 MG/ML IJ SOLN
INTRAMUSCULAR | Status: DC | PRN
Start: 2013-11-03 — End: 2013-11-03
  Administered 2013-11-03: 25 ug via INTRAVENOUS
  Administered 2013-11-03: 100 ug via INTRAVENOUS
  Administered 2013-11-03 (×2): 25 ug via INTRAVENOUS

## 2013-11-03 MED ORDER — PROPOFOL 10 MG/ML IV BOLUS
INTRAVENOUS | Status: AC
  Filled 2013-11-03: qty 20

## 2013-11-03 MED ORDER — SODIUM CHLORIDE 0.9 % IJ SOLN
INTRAMUSCULAR | Status: AC
  Filled 2013-11-03: qty 10

## 2013-11-03 MED ORDER — PHENYLEPHRINE HCL 10 MG/ML IJ SOLN
INTRAMUSCULAR | Status: DC | PRN
  Administered 2013-11-03: 120 ug via INTRAVENOUS
  Administered 2013-11-03: 80 ug via INTRAVENOUS
  Administered 2013-11-03: 40 ug via INTRAVENOUS

## 2013-11-03 MED ORDER — OXYCODONE HCL 5 MG PO TABS: 5.0000 mg | ORAL_TABLET | ORAL | Status: DC | PRN

## 2013-11-03 MED ORDER — KETOROLAC TROMETHAMINE 30 MG/ML IJ SOLN
INTRAMUSCULAR | Status: AC
  Filled 2013-11-03: qty 1

## 2013-11-03 MED ORDER — EPHEDRINE SULFATE 50 MG/ML IJ SOLN
INTRAMUSCULAR | Status: AC
  Filled 2013-11-03: qty 1

## 2013-11-03 MED ORDER — ROCURONIUM BROMIDE 50 MG/5ML IV SOLN
INTRAVENOUS | Status: AC
  Filled 2013-11-03: qty 1

## 2013-11-03 MED ORDER — SODIUM CHLORIDE 0.9 % IJ SOLN: 3.0000 mL | Freq: Two times a day (BID) | INTRAMUSCULAR | Status: DC

## 2013-11-03 MED ORDER — SODIUM CHLORIDE 0.9 % IJ SOLN: 3.0000 mL | INTRAMUSCULAR | Status: DC | PRN

## 2013-11-03 MED ORDER — FENTANYL CITRATE 0.05 MG/ML IJ SOLN
INTRAMUSCULAR | Status: AC
  Filled 2013-11-03: qty 5

## 2013-11-03 MED ORDER — PHENYLEPHRINE HCL 10 MG/ML IJ SOLN
10.0000 mg | INTRAVENOUS | Status: DC | PRN
  Administered 2013-11-03: 50 ug/min via INTRAVENOUS

## 2013-11-03 MED ORDER — LACTATED RINGERS IV SOLN
INTRAVENOUS | Status: DC | PRN
  Administered 2013-11-03: 09:00:00 via INTRAVENOUS

## 2013-11-03 MED ORDER — SODIUM CHLORIDE 0.9 % IV SOLN: 250.0000 mL | INTRAVENOUS | Status: DC | PRN

## 2013-11-03 MED ORDER — OXYCODONE-ACETAMINOPHEN 5-325 MG PO TABS: 1.0000 | ORAL_TABLET | ORAL | Status: DC | PRN

## 2013-11-03 MED ORDER — MIDAZOLAM HCL 2 MG/2ML IJ SOLN
INTRAMUSCULAR | Status: AC
Start: 2013-11-03 — End: 2013-11-03
  Filled 2013-11-03: qty 2

## 2013-11-03 MED ORDER — EPHEDRINE SULFATE 50 MG/ML IJ SOLN
INTRAMUSCULAR | Status: DC | PRN
  Administered 2013-11-03: 10 mg via INTRAVENOUS

## 2013-11-03 MED ORDER — LIDOCAINE HCL (CARDIAC) 20 MG/ML IV SOLN
INTRAVENOUS | Status: DC | PRN
  Administered 2013-11-03: 60 mg via INTRAVENOUS

## 2013-11-03 MED ORDER — PROPOFOL 10 MG/ML IV BOLUS
INTRAVENOUS | Status: DC | PRN
  Administered 2013-11-03: 200 mg via INTRAVENOUS

## 2013-11-03 MED ORDER — 0.9 % SODIUM CHLORIDE (POUR BTL) OPTIME
TOPICAL | Status: DC | PRN
  Administered 2013-11-03: 1000 mL

## 2013-11-03 MED ORDER — ARTIFICIAL TEARS OP OINT
TOPICAL_OINTMENT | OPHTHALMIC | Status: AC
  Filled 2013-11-03: qty 3.5

## 2013-11-03 MED ORDER — BUPIVACAINE-EPINEPHRINE 0.25% -1:200000 IJ SOLN
INTRAMUSCULAR | Status: DC | PRN
  Administered 2013-11-03: 30 mL

## 2013-11-03 MED ORDER — LIDOCAINE HCL (CARDIAC) 20 MG/ML IV SOLN
INTRAVENOUS | Status: AC
  Filled 2013-11-03: qty 5

## 2013-11-03 MED ORDER — LIDOCAINE HCL (CARDIAC) 20 MG/ML IV SOLN
INTRAVENOUS | Status: AC
  Filled 2013-11-03: qty 15

## 2013-11-03 MED ORDER — KETOROLAC TROMETHAMINE 30 MG/ML IJ SOLN
INTRAMUSCULAR | Status: DC | PRN
  Administered 2013-11-03: 30 mg via INTRAVENOUS

## 2013-11-03 MED ORDER — PHENYLEPHRINE HCL 10 MG/ML IJ SOLN
INTRAMUSCULAR | Status: AC
  Filled 2013-11-03: qty 1

## 2013-11-03 MED ORDER — ROCURONIUM BROMIDE 50 MG/5ML IV SOLN
INTRAVENOUS | Status: AC
  Filled 2013-11-03: qty 3

## 2013-11-03 MED ORDER — ACETAMINOPHEN 325 MG PO TABS
650.0000 mg | ORAL_TABLET | ORAL | Status: DC | PRN
  Filled 2013-11-03: qty 2

## 2013-11-03 MED ORDER — LACTATED RINGERS IV SOLN
INTRAVENOUS | Status: DC
  Administered 2013-11-03: 09:00:00 via INTRAVENOUS

## 2013-11-03 MED ORDER — BUPIVACAINE-EPINEPHRINE (PF) 0.25% -1:200000 IJ SOLN
INTRAMUSCULAR | Status: AC
  Filled 2013-11-03: qty 30

## 2013-11-03 MED ORDER — ACETAMINOPHEN 650 MG RE SUPP
650.0000 mg | RECTAL | Status: DC | PRN
Start: 2013-11-03 — End: 2013-11-03
  Filled 2013-11-03: qty 1

## 2013-11-03 MED ORDER — ONDANSETRON HCL 4 MG/2ML IJ SOLN
INTRAMUSCULAR | Status: DC | PRN
  Administered 2013-11-03 (×2): 4 mg via INTRAVENOUS

## 2013-11-03 SURGICAL SUPPLY — 53 items
APL SKNCLS STERI-STRIP NONHPOA (GAUZE/BANDAGES/DRESSINGS) ×1
BENZOIN TINCTURE PRP APPL 2/3 (GAUZE/BANDAGES/DRESSINGS) ×2 IMPLANT
BLADE SURG 10 STRL SS (BLADE) ×2 IMPLANT
BLADE SURG 15 STRL LF DISP TIS (BLADE) ×1 IMPLANT
BLADE SURG 15 STRL SS (BLADE) ×2
BLADE SURG ROTATE 9660 (MISCELLANEOUS) ×1 IMPLANT
CANISTER SUCTION 2500CC (MISCELLANEOUS) IMPLANT
CHLORAPREP W/TINT 26ML (MISCELLANEOUS) ×2 IMPLANT
COVER SURGICAL LIGHT HANDLE (MISCELLANEOUS) ×2 IMPLANT
DRAIN PENROSE 1/2X12 LTX STRL (WOUND CARE) ×1 IMPLANT
DRAPE LAPAROTOMY TRNSV 102X78 (DRAPE) IMPLANT
DRAPE PED LAPAROTOMY (DRAPES) IMPLANT
DRAPE UTILITY 15X26 W/TAPE STR (DRAPE) ×4 IMPLANT
DRSG TEGADERM 4X4.75 (GAUZE/BANDAGES/DRESSINGS) ×2 IMPLANT
ELECT CAUTERY BLADE 6.4 (BLADE) ×2 IMPLANT
ELECT REM PT RETURN 9FT ADLT (ELECTROSURGICAL) ×2
ELECTRODE REM PT RTRN 9FT ADLT (ELECTROSURGICAL) ×1 IMPLANT
GAUZE SPONGE 4X4 12PLY STRL (GAUZE/BANDAGES/DRESSINGS) ×2 IMPLANT
GLOVE BIO SURGEON STRL SZ8 (GLOVE) ×1 IMPLANT
GLOVE BIOGEL M STRL SZ7.5 (GLOVE) ×2 IMPLANT
GLOVE BIOGEL PI IND STRL 7.0 (GLOVE) IMPLANT
GLOVE BIOGEL PI IND STRL 8 (GLOVE) ×1 IMPLANT
GLOVE BIOGEL PI INDICATOR 7.0 (GLOVE) ×1
GLOVE BIOGEL PI INDICATOR 8 (GLOVE) ×3
GOWN STRL REUS W/ TWL LRG LVL3 (GOWN DISPOSABLE) ×1 IMPLANT
GOWN STRL REUS W/ TWL XL LVL3 (GOWN DISPOSABLE) ×1 IMPLANT
GOWN STRL REUS W/TWL LRG LVL3 (GOWN DISPOSABLE) ×2
GOWN STRL REUS W/TWL XL LVL3 (GOWN DISPOSABLE) ×2
KIT BASIN OR (CUSTOM PROCEDURE TRAY) ×3 IMPLANT
KIT ROOM TURNOVER OR (KITS) ×2 IMPLANT
MESH ULTRAPRO 3X6 7.6X15CM (Mesh General) ×1 IMPLANT
NDL HYPO 25GX1X1/2 BEV (NEEDLE) ×1 IMPLANT
NEEDLE HYPO 25GX1X1/2 BEV (NEEDLE) ×2 IMPLANT
NS IRRIG 1000ML POUR BTL (IV SOLUTION) ×2 IMPLANT
PACK SURGICAL SETUP 50X90 (CUSTOM PROCEDURE TRAY) ×2 IMPLANT
PAD ARMBOARD 7.5X6 YLW CONV (MISCELLANEOUS) ×2 IMPLANT
PENCIL BUTTON HOLSTER BLD 10FT (ELECTRODE) ×2 IMPLANT
SPECIMEN JAR SMALL (MISCELLANEOUS) IMPLANT
SPONGE GAUZE 4X4 12PLY STER LF (GAUZE/BANDAGES/DRESSINGS) ×1 IMPLANT
SPONGE INTESTINAL PEANUT (DISPOSABLE) ×1 IMPLANT
SPONGE LAP 18X18 X RAY DECT (DISPOSABLE) ×2 IMPLANT
STRIP CLOSURE SKIN 1/2X4 (GAUZE/BANDAGES/DRESSINGS) ×2 IMPLANT
SUT MNCRL AB 4-0 PS2 18 (SUTURE) ×2 IMPLANT
SUT PROLENE 2 0 CT2 30 (SUTURE) ×4 IMPLANT
SUT VIC AB 2-0 CT1 36 (SUTURE) ×2 IMPLANT
SUT VIC AB 3-0 SH 18 (SUTURE) ×2 IMPLANT
SUT VICRYL AB 3 0 TIES (SUTURE) ×2 IMPLANT
SYR BULB 3OZ (MISCELLANEOUS) ×2 IMPLANT
SYR CONTROL 10ML LL (SYRINGE) ×2 IMPLANT
TOWEL OR 17X24 6PK STRL BLUE (TOWEL DISPOSABLE) ×4 IMPLANT
TOWEL OR 17X26 10 PK STRL BLUE (TOWEL DISPOSABLE) ×2 IMPLANT
TUBE CONNECTING 12X1/4 (SUCTIONS) IMPLANT
YANKAUER SUCT BULB TIP NO VENT (SUCTIONS) IMPLANT

## 2013-11-03 NOTE — H&P (Signed)
Allen Yu is an 68 y.o. male.   Chief Complaint: here for surgery; left groin pain HPI: 68 year old African American male referred by Dr. [name] for evaluation of a left inguinal hernia. He underwent an open right inguinal hernia repair by me in 2010. He states about a month and half ago he was moving a grill and he felt something pop on his left side. He states the bulge comes and goes. He states it does cause him some discomfort and pressure. He also describes a burning sensation. He denies any nausea or vomiting diarrhea or constipation. He continues to smoke one pack per day. He already takes flomax   Past Medical History  Diagnosis Date  . Diabetes mellitus   . Chronic kidney disease     stones hx  . PTSD (post-traumatic stress disorder)   . GERD (gastroesophageal reflux disease)   . Chronic back pain   . Schizophrenia   . Hypertension   . Tuberculosis     MED TX X 1 YEAR YEARS AGO   . Arthritis   . Peripheral neuropathy     Past Surgical History  Procedure Laterality Date  . Hernia repair  rt ing  . Bladder surgery      02  . Back surgery      x2  . Appendectomy    . Cholecystectomy    . Shoulder surgery      RIGHT SHOULDER     History reviewed. No pertinent family history. Social History:  reports that he has been smoking Cigarettes.  He has been smoking about 1.00 pack per day. He does not have any smokeless tobacco history on file. He reports that he uses illicit drugs (Marijuana). He reports that he does not drink alcohol.  Allergies:  Allergies  Allergen Reactions  . Bee Venom Other (See Comments)    Unknown   . Codeine Other (See Comments)    incoherent   . Diovan [Valsartan] Other (See Comments)    incoherent    Medications Prior to Admission  Medication Sig Dispense Refill  . aspirin EC 81 MG tablet Take 81 mg by mouth daily.        [name] atorvastatin (LIPITOR) 20 MG tablet Take 20 mg by mouth daily.        [name] ibuprofen (ADVIL,MOTRIN) 200 MG tablet  Take 400 mg by mouth every 6 (six) hours as needed (pain).      . insulin lispro (HUMALOG) 100 UNIT/ML injection Inject 10 Units into the skin 3 (three) times daily before meals.      . insulin NPH-regular Human (NOVOLIN 70/30) (70-30) 100 UNIT/ML injection Inject 70 Units into the skin at bedtime.      [name] losartan (COZAAR) 50 MG tablet Take 50 mg by mouth daily.        [name] omeprazole (PRILOSEC) 20 MG capsule Take 20 mg by mouth daily.      . Tamsulosin HCl (FLOMAX) 0.4 MG CAPS Take 0.4 mg by mouth daily.          Results for orders placed during the hospital encounter of 11/03/13 (from the past 48 hour(s))  GLUCOSE, CAPILLARY     Status: Abnormal   Collection Time    11/03/13  8:14 AM      Result Value Ref Range   Glucose-Capillary 223 (*) 70 - 99 mg/dL   No results found.  Review of Systems  Constitutional: Negative for weight loss.  HENT: Negative for nosebleeds.   Eyes:  Negative for blurred vision.  Respiratory: Negative for shortness of breath.   Cardiovascular: Negative for chest pain, palpitations, orthopnea and PND.       Denies DOE  Genitourinary: Negative for dysuria and hematuria.  Musculoskeletal: Negative.   Skin: Negative for itching and rash.  Neurological: Negative for dizziness, focal weakness, seizures, loss of consciousness and headaches.       Denies TIAs, amaurosis fugax  Endo/Heme/Allergies: Does not bruise/bleed easily.  Psychiatric/Behavioral: The patient is not nervous/anxious.     Blood pressure 145/80, pulse 83, temperature 98.5 F (36.9 C), temperature source Oral, resp. rate 20, height 6\' 1"  (1.854 m), weight 161 lb (73.029 kg), SpO2 98.00%. Physical Exam  Vitals reviewed. Constitutional: He is oriented to person, place, and time. He appears well-developed and well-nourished. No distress.  HENT:  Head: Normocephalic and atraumatic.  Right Ear: External ear normal.  Left Ear: External ear normal.  Eyes: Conjunctivae are normal. No scleral icterus.    Neck: Normal range of motion. Neck supple. No tracheal deviation present.  Cardiovascular: Normal rate and normal heart sounds.   Respiratory: Effort normal. No stridor. No respiratory distress. He has no wheezes.  GI: Soft. He exhibits no distension. There is no tenderness. There is no rebound. A hernia is present. Hernia confirmed positive in the ventral area and confirmed positive in the left inguinal area. Hernia confirmed negative in the right inguinal area.    Musculoskeletal: He exhibits no edema and no tenderness.  Neurological: He is alert and oriented to person, place, and time. He exhibits normal muscle tone.  Skin: Skin is warm and dry. No rash noted. He is not diaphoretic. No erythema. No pallor.  Psychiatric: He has a normal mood and affect. His behavior is normal. Judgment and thought content normal.     Assessment/Plan Left inguinal hernia CKD HTN Uncontrolled DM2 Schizophrenia GERD  To OR for open repair of left inguinal hernia with mesh. All questions asked and answered.   Leighton Ruff. Redmond Pulling, MD, FACS General, Bariatric, & Minimally Invasive Surgery [facility] Surgery, [place]   [facility] M 11/03/2013, 8:51 AM

## 2013-11-03 NOTE — OR Nursing (Signed)
Dr. Tamala Julian informed of CBG, no new orders, pt to treat when home.

## 2013-11-03 NOTE — Anesthesia Postprocedure Evaluation (Signed)
  Anesthesia Post-op Note  Patient: Allen Yu  Procedure(s) Performed: Procedure(s): HERNIA REPAIR INGUINAL ADULT (Left)  Patient Location: PACU  Anesthesia Type:General  Level of Consciousness: awake, alert , oriented and patient cooperative  Airway and Oxygen Therapy: Patient Spontanous Breathing  Post-op Pain: mild  Post-op Assessment: Post-op Vital signs reviewed, Patient's Cardiovascular Status Stable, Respiratory Function Stable, Patent Airway, No signs of Nausea or vomiting and Pain level controlled  Post-op Vital Signs: stable  Last Vitals:  Filed Vitals:   11/03/13 1130  BP: 133/65  Pulse: 94  Temp: 36.8 C  Resp:     Complications: No apparent anesthesia complications

## 2013-11-03 NOTE — Anesthesia Preprocedure Evaluation (Addendum)
Anesthesia Evaluation  Patient identified by MRN, date of birth, ID band Patient awake    Reviewed: Allergy & Precautions, H&P , NPO status , Patient's Chart, lab work & pertinent test results  Airway Mallampati: II TM Distance: >3 FB Neck ROM: Full    Dental  (+) Teeth Intact, Dental Advisory Given   Pulmonary Current Smoker,  breath sounds clear to auscultation        Cardiovascular hypertension,     Neuro/Psych PSYCHIATRIC DISORDERS Schizophrenia  Neuromuscular disease    GI/Hepatic GERD-  ,  Endo/Other  diabetes, Type 2, Insulin Dependent  Renal/GU Renal disease     Musculoskeletal  (+) Arthritis -,   Abdominal   Peds  Hematology   Anesthesia Other Findings   Reproductive/Obstetrics                        Anesthesia Physical Anesthesia Plan  ASA: III  Anesthesia Plan: General   Post-op Pain Management:    Induction: Intravenous  Airway Management Planned: LMA  Additional Equipment:   Intra-op Plan:   Post-operative Plan: Extubation in OR  Informed Consent: I have reviewed the patients History and Physical, chart, labs and discussed the procedure including the risks, benefits and alternatives for the proposed anesthesia with the patient or authorized representative who has indicated his/her understanding and acceptance.   Dental advisory given  Plan Discussed with: Anesthesiologist, Surgeon and CRNA  Anesthesia Plan Comments:       Anesthesia Quick Evaluation

## 2013-11-03 NOTE — Transfer of Care (Signed)
Immediate Anesthesia Transfer of Care Note  Patient: Allen Yu  Procedure(s) Performed: Procedure(s): HERNIA REPAIR INGUINAL ADULT (Left)  Patient Location: PACU  Anesthesia Type:General  Level of Consciousness: awake, alert  and oriented  Airway & Oxygen Therapy: Patient Spontanous Breathing, [place]  Post-op Assessment: Report given to PACU RN and Post -op Vital signs reviewed and stable  Post vital signs: Reviewed and stable  Complications: No apparent anesthesia complications

## 2013-11-03 NOTE — Op Note (Signed)
Allen Yu [id number] 01-08-1946 11/03/2013   Open Left Indirect Inguinal Hernia Repair with Mesh Procedure Note  Indications: The patient presented with a history of a left, reducible hernia.    Pre-operative Diagnosis: left reducible inguinal hernia, uncontrolled DM2, tobacco use, HTN  Post-operative Diagnosis: left indirect inguinal hernia, same  Surgeon: Gayland Curry   Assistants: none  Anesthesia: General LMA anesthesia  ASA Class: 3   Surgeon: Leighton Ruff. Redmond Pulling, MD, FACS  Procedure Details  The patient was seen again in the Holding Room. The risks, benefits, complications, treatment options, and expected outcomes were discussed with the patient. The possibilities of reaction to medication, pulmonary aspiration, perforation of viscus, bleeding, recurrent infection, the need for additional procedures, and development of a complication requiring transfusion or further operation were discussed with the patient and/or family. The likelihood of success in repairing the hernia and returning the patient to their previous functional status is good.  There was concurrence with the proposed plan, and informed consent was obtained. The site of surgery was properly noted/marked. The patient was taken to the Operating Room, identified as Allen Yu, and the procedure verified as left inguinal hernia repair. A Time Out was held and the above information confirmed. IV antibiotic was administered.   The patient was placed in the supine position and underwent induction of anesthesia. The lower abdomen and groin was prepped with Chloraprep and draped in the standard fashion, and 0.25% Marcaine with epinephrine was used to anesthetize the skin over the mid-portion of the inguinal canal. An oblique incision was made. Dissection was carried down through the subcutaneous tissue with cautery to the external oblique fascia.  We opened the external oblique fascia along the direction of its fibers to the  external ring.  The spermatic cord was circumferentially dissected bluntly and retracted with a Penrose drain.  The floor of the inguinal canal was inspected and no direct defect was found.  We skeletonized the spermatic cord and isolated the hernia sac and reduced it into the abdominal cavity.  We used a 3 x 6 inch piece of Ultrapro mesh, which was cut into a keyhole shape.  This was secured with 2-0 Prolene, beginning at the pubic tubercle, running this along the shelving edge inferiorly. Superiorly, the mesh was secured to the internal oblique fascia with interrupted 2-0 Prolene sutures.  The tails of the mesh were sutured together behind the spermatic cord.  The mesh was tucked underneath the external oblique fascia laterally.  The external oblique fascia was reapproximated with 2-0 Vicryl.  3-0 Vicryl was used to close the subcutaneous tissues and 4-0 Monocryl was used to close the skin in subcuticular fashion.  Benzoin and steri-strips were used to seal the incision.  A clean dressing was applied.  The patient was then extubated and brought to the recovery room in stable condition.  All sponge, instrument, and needle counts were correct prior to closure and at the conclusion of the case.   Estimated Blood Loss: Minimal                 Complications: None; patient tolerated the procedure well.         Disposition: PACU - hemodynamically stable.         Condition: stable  Leighton Ruff. Redmond Pulling, MD, FACS General, Bariatric, & Minimally Invasive Surgery [facility] Surgery, [place]

## 2013-11-03 NOTE — Discharge Instructions (Signed)
[facility]  UMBILICAL OR INGUINAL HERNIA REPAIR: POST OP INSTRUCTIONS  Always review your discharge instruction sheet given to you by the facility where your surgery was performed. IF YOU HAVE DISABILITY OR FAMILY LEAVE FORMS, YOU MUST BRING THEM TO THE OFFICE FOR PROCESSING.   DO NOT GIVE THEM TO YOUR DOCTOR.  1. A  prescription for pain medication may be given to you upon discharge.  Take your pain medication as prescribed, if needed.  If narcotic pain medicine is not needed, then you may take acetaminophen (Tylenol) or ibuprofen (Advil) as needed. 2. Take your usually prescribed medications unless otherwise directed. 3. If you need a refill on your pain medication, please contact your pharmacy.  They will contact our office to request authorization. Prescriptions will not be filled after 5 pm or on week-ends. 4. You should follow a light diet the first 24 hours after arrival home, such as soup and crackers, etc.  Be sure to include lots of fluids daily.  Resume your normal diet the day after surgery. 5. Most patients will experience some swelling and bruising around the umbilicus or in the groin and scrotum.  Ice packs and reclining will help.  Swelling and bruising can take several days to resolve.  6. It is common to experience some constipation if taking pain medication after surgery.  Increasing fluid intake and taking a stool softener (such as Colace) will usually help or prevent this problem from occurring.  A mild laxative (Milk of Magnesia or Miralax) should be taken according to package directions if there are no bowel movements after 48 hours. 7. Unless discharge instructions indicate otherwise, you may remove your bandages 48 hours after surgery, and you may shower at that time.  You  have steri-strips (small skin tapes) in place directly over the incision.  These strips should be left on the skin for 7-10 days.   8. ACTIVITIES:  You may resume regular (light) daily  activities beginning the next day--such as daily self-care, walking, climbing stairs--gradually increasing activities as tolerated.  You may have sexual intercourse when it is comfortable.  Refrain from any heavy lifting or straining until approved by your doctor. a. You may drive when you are no longer taking prescription pain medication, you can comfortably wear a seatbelt, and you can safely maneuver your car and apply brakes. b. RETURN TO WORK: 1-2 WEEKS WITH RESTRICTIONS 9. You should see your doctor in the office for a follow-up appointment approximately 2-3 weeks after your surgery.  Make sure that you call for this appointment within a day or two after you arrive home to insure a convenient appointment time. 10. OTHER INSTRUCTIONS: DO NOT LIFT, PUSH, OR PULL ANYTHING GREATER THAN 10 POUNDS FOR 6 WEEKS    WHEN TO CALL YOUR DOCTOR: 1. Fever over 101.0 2. Inability to urinate 3. Nausea and/or vomiting 4. Extreme swelling or bruising 5. Continued bleeding from incision. 6. Increased pain, redness, or drainage from the incision  The clinic staff is available to answer your questions during regular business hours.  Please dont hesitate to call and ask to speak to one of the nurses for clinical concerns.  If you have a medical emergency, go to the nearest emergency room or call 911.  A surgeon from [facility] is always on call at the hospital   [address], [place], [place], [place]  [address] ?  [address], [place], [place]   [address] [phone] ? [phone] ?  FAX [phone] Web site: [email or web address]   What to eat:  For your first meals, you should eat lightly; only small meals initially.  If you do not have nausea, you may eat larger meals.  Avoid spicy, greasy and heavy food.    General Anesthesia, Adult, Care After  Refer to this sheet in the next few weeks. These instructions provide you with information on caring for yourself after  your procedure. Your health care provider may also give you more specific instructions. Your treatment has been planned according to current medical practices, but problems sometimes occur. Call your health care provider if you have any problems or questions after your procedure.  WHAT TO EXPECT AFTER THE PROCEDURE  After the procedure, it is typical to experience:  Sleepiness.  Nausea and vomiting. HOME CARE INSTRUCTIONS  For the first 24 hours after general anesthesia:  Have a responsible person with you.  Do not drive a car. If you are alone, do not take public transportation.  Do not drink alcohol.  Do not take medicine that has not been prescribed by your health care provider.  Do not sign important papers or make important decisions.  You may resume a normal diet and activities as directed by your health care provider.  Change bandages (dressings) as directed.  If you have questions or problems that seem related to general anesthesia, call the hospital and ask for the anesthetist or anesthesiologist on call. SEEK MEDICAL CARE IF:  You have nausea and vomiting that continue the day after anesthesia.  You develop a rash. SEEK IMMEDIATE MEDICAL CARE IF:  You have difficulty breathing.  You have chest pain.  You have any allergic problems. Document Released: 04/16/2000 Document Revised: 09/10/2012 Document Reviewed: 07/24/2012  [facility] Patient Information 2014 [place], [place].

## 2013-11-04 ENCOUNTER — Encounter (HOSPITAL_COMMUNITY): Payer: Self-pay | Admitting: General Surgery

## 2014-01-05 DIAGNOSIS — E119 Type 2 diabetes mellitus without complications: Secondary | ICD-10-CM | POA: Diagnosis not present

## 2014-01-12 DIAGNOSIS — I10 Essential (primary) hypertension: Secondary | ICD-10-CM | POA: Diagnosis not present

## 2014-01-12 DIAGNOSIS — E1129 Type 2 diabetes mellitus with other diabetic kidney complication: Secondary | ICD-10-CM | POA: Diagnosis not present

## 2014-02-09 DIAGNOSIS — E119 Type 2 diabetes mellitus without complications: Secondary | ICD-10-CM | POA: Diagnosis not present

## 2014-02-09 DIAGNOSIS — H2513 Age-related nuclear cataract, bilateral: Secondary | ICD-10-CM | POA: Diagnosis not present

## 2014-02-09 DIAGNOSIS — Z794 Long term (current) use of insulin: Secondary | ICD-10-CM | POA: Diagnosis not present

## 2014-02-18 ENCOUNTER — Encounter (HOSPITAL_COMMUNITY): Payer: Self-pay | Admitting: Cardiology

## 2014-02-18 ENCOUNTER — Emergency Department (HOSPITAL_COMMUNITY)
Admission: EM | Admit: 2014-02-18 | Discharge: 2014-02-18 | Disposition: A | Discharge status: Home / Self Care | Payer: Medicare Other | Attending: Emergency Medicine | Admitting: Emergency Medicine

## 2014-02-18 DIAGNOSIS — Z79899 Other long term (current) drug therapy: Secondary | ICD-10-CM | POA: Diagnosis not present

## 2014-02-18 DIAGNOSIS — M545 Low back pain, unspecified: Secondary | ICD-10-CM

## 2014-02-18 DIAGNOSIS — Z794 Long term (current) use of insulin: Secondary | ICD-10-CM | POA: Diagnosis not present

## 2014-02-18 DIAGNOSIS — Z8669 Personal history of other diseases of the nervous system and sense organs: Secondary | ICD-10-CM | POA: Insufficient documentation

## 2014-02-18 DIAGNOSIS — E119 Type 2 diabetes mellitus without complications: Secondary | ICD-10-CM | POA: Diagnosis not present

## 2014-02-18 DIAGNOSIS — Z7982 Long term (current) use of aspirin: Secondary | ICD-10-CM | POA: Diagnosis not present

## 2014-02-18 DIAGNOSIS — M1990 Unspecified osteoarthritis, unspecified site: Secondary | ICD-10-CM | POA: Insufficient documentation

## 2014-02-18 DIAGNOSIS — Z8611 Personal history of tuberculosis: Secondary | ICD-10-CM | POA: Diagnosis not present

## 2014-02-18 DIAGNOSIS — Z720 Tobacco use: Secondary | ICD-10-CM | POA: Diagnosis not present

## 2014-02-18 DIAGNOSIS — N189 Chronic kidney disease, unspecified: Secondary | ICD-10-CM | POA: Diagnosis not present

## 2014-02-18 DIAGNOSIS — Z8659 Personal history of other mental and behavioral disorders: Secondary | ICD-10-CM | POA: Diagnosis not present

## 2014-02-18 DIAGNOSIS — Z9049 Acquired absence of other specified parts of digestive tract: Secondary | ICD-10-CM | POA: Diagnosis not present

## 2014-02-18 DIAGNOSIS — R109 Unspecified abdominal pain: Secondary | ICD-10-CM | POA: Diagnosis present

## 2014-02-18 DIAGNOSIS — G8929 Other chronic pain: Secondary | ICD-10-CM | POA: Diagnosis not present

## 2014-02-18 DIAGNOSIS — K219 Gastro-esophageal reflux disease without esophagitis: Secondary | ICD-10-CM | POA: Insufficient documentation

## 2014-02-18 DIAGNOSIS — I129 Hypertensive chronic kidney disease with stage 1 through stage 4 chronic kidney disease, or unspecified chronic kidney disease: Secondary | ICD-10-CM | POA: Diagnosis not present

## 2014-02-18 LAB — URINALYSIS, ROUTINE W REFLEX MICROSCOPIC
BILIRUBIN URINE: NEGATIVE
Glucose, UA: >1000 mg/dL — AB
Ketones, ur: NEGATIVE mg/dL
Leukocytes, UA: NEGATIVE
Nitrite: NEGATIVE
PH: 5.5 (ref 5.0–8.0)
PROTEIN: 100 mg/dL — AB
Specific Gravity, Urine: >1.03 — ABNORMAL HIGH (ref 1.005–1.030)
UROBILINOGEN UA: 0.2 mg/dL (ref 0.0–1.0)

## 2014-02-18 LAB — CBC WITH DIFFERENTIAL/PLATELET
Basophils Absolute: 0 10*3/uL (ref 0.0–0.1)
Basophils Relative: 1 % (ref 0–1)
Eosinophils Absolute: 0.1 10*3/uL (ref 0.0–0.7)
Eosinophils Relative: 2 % (ref 0–5)
HCT: 30.2 % — ABNORMAL LOW (ref 39.0–52.0)
HEMOGLOBIN: 10.3 g/dL — AB (ref 13.0–17.0)
LYMPHS PCT: 34 % (ref 12–46)
Lymphs Abs: 1.8 10*3/uL (ref 0.7–4.0)
MCH: 30.7 pg (ref 26.0–34.0)
MCHC: 34.1 g/dL (ref 30.0–36.0)
MCV: 89.9 fL (ref 78.0–100.0)
Monocytes Absolute: 0.4 10*3/uL (ref 0.1–1.0)
Monocytes Relative: 8 % (ref 3–12)
NEUTROS PCT: 55 % (ref 43–77)
Neutro Abs: 2.9 10*3/uL (ref 1.7–7.7)
Platelets: 206 10*3/uL (ref 150–400)
RBC: 3.36 MIL/uL — ABNORMAL LOW (ref 4.22–5.81)
RDW: 13.6 % (ref 11.5–15.5)
WBC: 5.2 10*3/uL (ref 4.0–10.5)

## 2014-02-18 LAB — BASIC METABOLIC PANEL
ANION GAP: 5 (ref 5–15)
BUN: 13 mg/dL (ref 6–23)
CHLORIDE: 103 mmol/L (ref 96–112)
CO2: 28 mmol/L (ref 19–32)
Calcium: 9.2 mg/dL (ref 8.4–10.5)
Creatinine, Ser: 1.09 mg/dL (ref 0.50–1.35)
GFR calc non Af Amer: 68 mL/min — ABNORMAL LOW (ref >90)
GFR, EST AFRICAN AMERICAN: 79 mL/min — AB (ref >90)
Glucose, Bld: 261 mg/dL — ABNORMAL HIGH (ref 70–99)
POTASSIUM: 4.2 mmol/L (ref 3.5–5.1)
Sodium: 136 mmol/L (ref 135–145)

## 2014-02-18 LAB — URINE MICROSCOPIC-ADD ON

## 2014-02-18 MED ORDER — OXYCODONE-ACETAMINOPHEN 5-325 MG PO TABS: 1.0000 | ORAL_TABLET | ORAL | Status: DC | PRN

## 2014-02-18 MED ORDER — KETOROLAC TROMETHAMINE 30 MG/ML IJ SOLN
15.0000 mg | Freq: Once | INTRAMUSCULAR | Status: AC
  Administered 2014-02-18: 15 mg via INTRAMUSCULAR
  Filled 2014-02-18: qty 1

## 2014-02-18 MED ORDER — DIAZEPAM 5 MG PO TABS: 2.5000 mg | ORAL_TABLET | Freq: Four times a day (QID) | ORAL | Status: DC | PRN

## 2014-02-18 NOTE — Discharge Instructions (Signed)
Back Pain, Adult Low back pain is very common. About 1 in 5 people have back pain.The cause of low back pain is rarely dangerous. The pain often gets better over time.About half of people with a sudden onset of back pain feel better in just 2 weeks. About 8 in 10 people feel better by 6 weeks.  CAUSES Some common causes of back pain include:  Strain of the muscles or ligaments supporting the spine.  Wear and tear (degeneration) of the spinal discs.  Arthritis.  Direct injury to the back. DIAGNOSIS Most of the time, the direct cause of low back pain is not known.However, back pain can be treated effectively even when the exact cause of the pain is unknown.Answering your caregiver's questions about your overall health and symptoms is one of the most accurate ways to make sure the cause of your pain is not dangerous. If your caregiver needs more information, he or she may order lab work or imaging tests (X-rays or MRIs).However, even if imaging tests show changes in your back, this usually does not require surgery. HOME CARE INSTRUCTIONS For many people, back pain returns.Since low back pain is rarely dangerous, it is often a condition that people can learn to manageon their own.   Remain active. It is stressful on the back to sit or stand in one place. Do not sit, drive, or stand in one place for more than 30 minutes at a time. Take short walks on level surfaces as soon as pain allows.Try to increase the length of time you walk each day.  Do not stay in bed.Resting more than 1 or 2 days can delay your recovery.  Do not avoid exercise or work.Your body is made to move.It is not dangerous to be active, even though your back may hurt.Your back will likely heal faster if you return to being active before your pain is gone.  Pay attention to your body when you bend and lift. Many people have less discomfortwhen lifting if they bend their knees, keep the load close to their bodies,and  avoid twisting. Often, the most comfortable positions are those that put less stress on your recovering back.  Find a comfortable position to sleep. Use a firm mattress and lie on your side with your knees slightly bent. If you lie on your back, put a pillow under your knees.  Only take over-the-counter or prescription medicines as directed by your caregiver. Over-the-counter medicines to reduce pain and inflammation are often the most helpful.Your caregiver may prescribe muscle relaxant drugs.These medicines help dull your pain so you can more quickly return to your normal activities and healthy exercise.  Put ice on the injured area.  Put ice in a plastic bag.  Place a towel between your skin and the bag.  Leave the ice on for 15-20 minutes, 03-04 times a day for the first 2 to 3 days. After that, ice and heat may be alternated to reduce pain and spasms.  Ask your caregiver about trying back exercises and gentle massage. This may be of some benefit.  Avoid feeling anxious or stressed.Stress increases muscle tension and can worsen back pain.It is important to recognize when you are anxious or stressed and learn ways to manage it.Exercise is a great option. SEEK MEDICAL CARE IF:  You have pain that is not relieved with rest or medicine.  You have pain that does not improve in 1 week.  You have new symptoms.  You are generally not feeling well. SEEK   IMMEDIATE MEDICAL CARE IF:   You have pain that radiates from your back into your legs.  You develop new bowel or bladder control problems.  You have unusual weakness or numbness in your arms or legs.  You develop nausea or vomiting.  You develop abdominal pain.  You feel faint. Document Released: 01/08/2005 Document Revised: 07/10/2011 Document Reviewed: 05/12/2013 ExitCare Patient Information 2015 [employer]. This information is not intended to replace advice given to you by your health care provider. Make sure you  discuss any questions you have with your health care provider.  

## 2014-02-18 NOTE — ED Notes (Addendum)
Right lower back/flank pain since December.   Has seen PCP for same.

## 2014-02-18 NOTE — ED Provider Notes (Signed)
CSN: [id number]     Arrival date & time 02/18/14  0904 History  This chart was scribed for Virgel Manifold, MD by Einar Pheasant, ED Scribe. This patient was seen in room APA05/APA05 and the patient's care was started at 10:07 AM.    Chief Complaint  Patient presents with  . Flank Pain   The history is provided by the patient and medical records. No language interpreter was used.   HPI Comments: Allen Yu is a 69 y.o. male with PMhx of DM, chronic kidney disease, GERD, HTN, and chronic back pain listed below presents to the Emergency Department complaining of sudden onset gradually worsening right sided flank pain since 1st of December. Pt reports being seen by his PCP for similar complaint. He states that the pain is constant and causing him sleep disturbances. Pt denies any falls or injuries. He states that he pain radiates down his right leg all the way down to his toes. Pt thinks that his associated leg pain may be linked to his hx of sciatica. Endorses 5 back surgeries in the last 4 years. All were done in [place], some by Dr. Percell Miller. Positive right leg paraesthesia. Reports taking OTC medication without adequate relief. Pt denies fever, neck pain, sore throat, visual disturbance, CP, cough, SOB, abdominal pain, nausea, emesis, diarrhea, urinary symptoms, HA, weakness, and rash as associated symptoms.    Past Medical History  Diagnosis Date  . Diabetes mellitus   . Chronic kidney disease     stones hx  . PTSD (post-traumatic stress disorder)   . GERD (gastroesophageal reflux disease)   . Chronic back pain   . Schizophrenia   . Hypertension   . Tuberculosis     MED TX X 1 YEAR YEARS AGO   . Arthritis   . Peripheral neuropathy    Past Surgical History  Procedure Laterality Date  . Hernia repair  rt ing  . Bladder surgery      02  . Back surgery      x2  . Appendectomy    . Cholecystectomy    . Shoulder surgery      RIGHT SHOULDER   . Inguinal hernia repair Left  11/03/2013    Procedure: HERNIA REPAIR INGUINAL ADULT;  Surgeon: Gayland Curry, MD;  Location: [facility];  Service: General;  Laterality: Left;   No family history on file. History  Substance Use Topics  . Smoking status: Current Every Day Smoker -- 1.00 packs/day    Types: Cigarettes  . Smokeless tobacco: Not on file  . Alcohol Use: No    Review of Systems  Constitutional: Negative for fever.  Respiratory: Negative for shortness of breath.   Cardiovascular: Negative for chest pain and leg swelling.  Gastrointestinal: Negative for abdominal pain, constipation and abdominal distention.  Genitourinary: Positive for flank pain. Negative for dysuria, urgency, frequency and difficulty urinating.  Musculoskeletal: Negative for back pain, joint swelling and gait problem.  Skin: Negative for rash.  Neurological: Negative for weakness and numbness.  All other systems reviewed and are negative.     Allergies  Bee venom; Codeine; and Diovan  Home Medications   Prior to Admission medications   Medication Sig Start Date End Date Taking? Authorizing Provider  aspirin EC 81 MG tablet Take 81 mg by mouth daily.      Historical Provider, MD  atorvastatin (LIPITOR) 20 MG tablet Take 20 mg by mouth daily.      Historical Provider, MD  ibuprofen (ADVIL,MOTRIN) 200  MG tablet Take 400 mg by mouth every 6 (six) hours as needed (pain).    Historical Provider, MD  insulin lispro (HUMALOG) 100 UNIT/ML injection Inject 10 Units into the skin 3 (three) times daily before meals.    Historical Provider, MD  insulin NPH-regular Human (NOVOLIN 70/30) (70-30) 100 UNIT/ML injection Inject 70 Units into the skin at bedtime.    Historical Provider, MD  losartan (COZAAR) 50 MG tablet Take 50 mg by mouth daily.      Historical Provider, MD  omeprazole (PRILOSEC) 20 MG capsule Take 20 mg by mouth daily.    Historical Provider, MD  oxyCODONE-acetaminophen (PERCOCET) 5-325 MG per tablet Take 1-2 tablets by mouth every 4  (four) hours as needed for severe pain. 11/03/13   Gayland Curry, MD  Tamsulosin HCl (FLOMAX) 0.4 MG CAPS Take 0.4 mg by mouth daily.      Historical Provider, MD   BP 152/79 mmHg  Pulse 80  Temp(Src) 97.9 F (36.6 C) (Oral)  Resp 20  Ht 6\' 1"  (1.854 m)  Wt 165 lb (74.844 kg)  BMI 21.77 kg/m2  SpO2 97%   Physical Exam  Constitutional: He appears well-developed and well-nourished. No distress.  HENT:  Head: Normocephalic and atraumatic.  Eyes: Conjunctivae are normal. Right eye exhibits no discharge. Left eye exhibits no discharge.  Neck: Neck supple.  Cardiovascular: Normal rate, regular rhythm and normal heart sounds.  Exam reveals no gallop and no friction rub.   No murmur heard. Pulmonary/Chest: Effort normal and breath sounds normal. No respiratory distress.  Abdominal: Soft. He exhibits no distension. There is no tenderness.  Musculoskeletal: He exhibits no edema or tenderness.  Midline lumbar scar noted. Back pain not reproducible with palpation.  Neurological: He is alert.  Strength 5/5 to bilateral lower extremities. Sensation intact to light touch.  Skin: Skin is warm and dry.  No concerning skin changes.  Psychiatric: He has a normal mood and affect. His behavior is normal. Thought content normal.  Nursing note and vitals reviewed.   ED Course  Procedures (including critical care time)  DIAGNOSTIC STUDIES: Oxygen Saturation is 97% on RA, adequate by my interpretation.    COORDINATION OF CARE: 10:12 AM- Pt advised of plan for treatment and pt agrees.   Labs Review Labs Reviewed - No data to display  Imaging Review No results found.   EKG Interpretation None      MDM   Final diagnoses:  Low back pain radiating to right lower extremity    68yM with radicular lower back pain. Nonfocal neuro exam. Afebrile. Ambulatory under own power. Low suspicion for emergent process. Return precautions discussed.   I personally preformed the services scribed in my  presence. The recorded information has been reviewed is accurate. Virgel Manifold, MD.    Virgel Manifold, MD 02/23/14 [phone]

## 2014-04-05 DIAGNOSIS — E119 Type 2 diabetes mellitus without complications: Secondary | ICD-10-CM | POA: Diagnosis not present

## 2014-06-01 DIAGNOSIS — D638 Anemia in other chronic diseases classified elsewhere: Secondary | ICD-10-CM | POA: Diagnosis not present

## 2014-06-01 DIAGNOSIS — E785 Hyperlipidemia, unspecified: Secondary | ICD-10-CM | POA: Diagnosis not present

## 2014-06-01 DIAGNOSIS — Z79899 Other long term (current) drug therapy: Secondary | ICD-10-CM | POA: Diagnosis not present

## 2014-06-01 DIAGNOSIS — E119 Type 2 diabetes mellitus without complications: Secondary | ICD-10-CM | POA: Diagnosis not present

## 2014-06-07 DIAGNOSIS — E1129 Type 2 diabetes mellitus with other diabetic kidney complication: Secondary | ICD-10-CM | POA: Diagnosis not present

## 2014-06-07 DIAGNOSIS — I10 Essential (primary) hypertension: Secondary | ICD-10-CM | POA: Diagnosis not present

## 2014-06-07 DIAGNOSIS — E785 Hyperlipidemia, unspecified: Secondary | ICD-10-CM | POA: Diagnosis not present

## 2014-09-30 DIAGNOSIS — E119 Type 2 diabetes mellitus without complications: Secondary | ICD-10-CM | POA: Diagnosis not present

## 2014-10-07 DIAGNOSIS — I10 Essential (primary) hypertension: Secondary | ICD-10-CM | POA: Diagnosis not present

## 2014-10-07 DIAGNOSIS — E119 Type 2 diabetes mellitus without complications: Secondary | ICD-10-CM | POA: Diagnosis not present

## 2014-10-07 DIAGNOSIS — G9009 Other idiopathic peripheral autonomic neuropathy: Secondary | ICD-10-CM | POA: Diagnosis not present

## 2014-10-07 DIAGNOSIS — Z23 Encounter for immunization: Secondary | ICD-10-CM | POA: Diagnosis not present

## 2014-10-20 ENCOUNTER — Telehealth: Payer: Self-pay

## 2014-10-20 NOTE — Telephone Encounter (Signed)
Pt called and left message for me to call and schedule his colonoscopy. I called and LMOM to [phone].

## 2014-10-21 ENCOUNTER — Telehealth: Payer: Self-pay

## 2014-10-21 NOTE — Telephone Encounter (Signed)
[phone]  PATIENT RETURNED CALL TO SCHEDULE TCS  USE THIS NUMBER

## 2014-10-22 NOTE — Telephone Encounter (Signed)
I spoke to pt yesterday afternoon. He thinks he had a colonoscopy by Dr. Gala Romney within the last 5 years. I found colonoscopy report from 11/03/2002 and also an EGD report from 12/29/2002. I have faxed [facility] requesting path from colonoscopy in 2004 and any new colonoscopy reports.

## 2014-10-29 NOTE — Telephone Encounter (Signed)
Second Request faxed to [facility].

## 2014-11-04 DIAGNOSIS — G3184 Mild cognitive impairment, so stated: Secondary | ICD-10-CM | POA: Diagnosis not present

## 2014-11-04 DIAGNOSIS — E1140 Type 2 diabetes mellitus with diabetic neuropathy, unspecified: Secondary | ICD-10-CM | POA: Diagnosis not present

## 2014-11-04 DIAGNOSIS — Z6832 Body mass index (BMI) 32.0-32.9, adult: Secondary | ICD-10-CM | POA: Diagnosis not present

## 2014-11-05 ENCOUNTER — Other Ambulatory Visit (HOSPITAL_COMMUNITY): Payer: Self-pay | Admitting: Internal Medicine

## 2014-11-05 DIAGNOSIS — G3184 Mild cognitive impairment, so stated: Secondary | ICD-10-CM

## 2014-11-09 ENCOUNTER — Ambulatory Visit (HOSPITAL_COMMUNITY)
Admission: RE | Admit: 2014-11-09 | Discharge: 2014-11-09 | Disposition: A | Discharge status: Home / Self Care | Payer: Medicare Other | Source: Ambulatory Visit | Attending: Internal Medicine | Admitting: Internal Medicine

## 2014-11-09 DIAGNOSIS — G3184 Mild cognitive impairment, so stated: Secondary | ICD-10-CM

## 2014-11-09 DIAGNOSIS — R51 Headache: Secondary | ICD-10-CM | POA: Diagnosis not present

## 2014-11-09 DIAGNOSIS — E119 Type 2 diabetes mellitus without complications: Secondary | ICD-10-CM | POA: Diagnosis not present

## 2014-11-09 DIAGNOSIS — I10 Essential (primary) hypertension: Secondary | ICD-10-CM | POA: Insufficient documentation

## 2014-11-09 DIAGNOSIS — R413 Other amnesia: Secondary | ICD-10-CM | POA: Diagnosis not present

## 2014-11-11 ENCOUNTER — Telehealth: Payer: Self-pay

## 2014-11-11 NOTE — Telephone Encounter (Signed)
Per Wannetta Sender at [facility], pt has not had a recent colonoscopy. Last colonoscopy was 11/03/2002 by Dr. Gala Romney. So he is past due for colonoscopy. Will triage.

## 2014-11-11 NOTE — Telephone Encounter (Signed)
PT referred by Dr. Willey Blade for screening colonoscopy. Last one 11/03/2002 by Dr.Rourk.  LMOM for a return call.

## 2014-11-15 ENCOUNTER — Other Ambulatory Visit: Payer: Self-pay

## 2014-11-15 DIAGNOSIS — Z1211 Encounter for screening for malignant neoplasm of colon: Secondary | ICD-10-CM

## 2014-11-15 MED ORDER — PEG 3350-KCL-NA BICARB-NACL 420 G PO SOLR: 4000.0000 mL | ORAL | Status: DC

## 2014-11-15 NOTE — Telephone Encounter (Addendum)
Gastroenterology Pre-Procedure Review  Request Date: 12/06/2014 Requesting Physician: Dr. Willey Blade  PATIENT REVIEW QUESTIONS: The patient responded to the following health history questions as indicated:    Pt's last colonoscopy was 11/03/2002 by Dr. Gala Romney  1. Diabetes Melitis: YES 2. Joint replacements in the past 12 months: no 3. Major health problems in the past 3 months: no 4. Has an artificial valve or MVP: no 5. Has a defibrillator: no 6. Has been advised in past to take antibiotics in advance of a procedure like teeth cleaning: no 7. Family history of colon cancer: no  8. Alcohol Use: no 9. History of sleep apnea: no     MEDICATIONS & ALLERGIES:    Patient reports the following regarding taking any blood thinners:   Plavix? no Aspirin? YES Coumadin? no  Patient confirms/reports the following medications:  Current Outpatient Prescriptions  Medication Sig Dispense Refill  . aspirin EC 81 MG tablet Take 81 mg by mouth at bedtime.     [name] atorvastatin (LIPITOR) 20 MG tablet Take 20 mg by mouth at bedtime.     [name] ibuprofen (ADVIL,MOTRIN) 200 MG tablet Take 400 mg by mouth every 6 (six) hours as needed (pain).    . insulin lispro (HUMALOG) 100 UNIT/ML injection Inject 10 Units into the skin 3 (three) times daily before meals.    . insulin NPH-regular Human (NOVOLIN 70/30) (70-30) 100 UNIT/ML injection Inject 70 Units into the skin at bedtime.    [name] losartan (COZAAR) 50 MG tablet Take 50 mg by mouth at bedtime.     . Tamsulosin HCl (FLOMAX) 0.4 MG CAPS Take 0.4 mg by mouth daily after supper.      No current facility-administered medications for this visit.   Facility-Administered Medications Ordered in Other Visits  Medication Dose Route Frequency Provider Last Rate Last Dose  . ondansetron (ZOFRAN) 4 mg in sodium chloride 0.9 % 50 mL IVPB  4 mg Intravenous Once Ashok Pall, MD        Patient confirms/reports the following allergies:  Allergies  Allergen Reactions  . Bee  Venom Other (See Comments)    Unknown   . Codeine Other (See Comments)    incoherent   . Diovan [Valsartan] Other (See Comments)    incoherent    No orders of the defined types were placed in this encounter.    AUTHORIZATION INFORMATION Primary Insurance:   ID #:   Group #:  Pre-Cert / Auth required:  Pre-Cert / Auth #:   Secondary Insurance:   ID #:  Group #:  Pre-Cert / Auth required: Pre-Cert / Auth #:   SCHEDULE INFORMATION: Procedure has been scheduled as follows:  Date:  12/06/2014                Time:  8:30 AM Place: [facility] Short Stay  This Gastroenterology Pre-Precedure Review Form is being routed to the following provider(s): R. Garfield Cornea, MD

## 2014-11-15 NOTE — Telephone Encounter (Signed)
OK to schedule.  Day of prep, humalog 5 units before meals (TID). Novolin 35units at bedtime.

## 2014-11-15 NOTE — Telephone Encounter (Signed)
Pt has been rescheduled to 12/07/2014 at 10:30 Am due to Dr. Roseanne Kaufman Monday OR cases.  Pt is aware.

## 2014-11-15 NOTE — Telephone Encounter (Signed)
Rx sent to the pharmacy and instructions mailed to pt.  

## 2014-11-30 DIAGNOSIS — I69111 Memory deficit following nontraumatic intracerebral hemorrhage: Secondary | ICD-10-CM | POA: Diagnosis not present

## 2014-11-30 DIAGNOSIS — Z79899 Other long term (current) drug therapy: Secondary | ICD-10-CM | POA: Diagnosis not present

## 2014-12-03 ENCOUNTER — Encounter: Payer: Self-pay | Admitting: Internal Medicine

## 2014-12-07 ENCOUNTER — Ambulatory Visit (HOSPITAL_COMMUNITY)
Admission: RE | Admit: 2014-12-07 | Discharge: 2014-12-07 | Disposition: A | Discharge status: Home / Self Care | Payer: Medicare Other | Source: Ambulatory Visit | Attending: Internal Medicine | Admitting: Internal Medicine

## 2014-12-07 ENCOUNTER — Encounter (HOSPITAL_COMMUNITY): Payer: Self-pay | Admitting: *Deleted

## 2014-12-07 ENCOUNTER — Ambulatory Visit: Admit: 2014-12-07 | Payer: TRICARE For Life (TFL) | Admitting: Internal Medicine

## 2014-12-07 ENCOUNTER — Encounter (HOSPITAL_COMMUNITY)
Admission: RE | Disposition: A | Discharge status: Home / Self Care | Payer: Self-pay | Source: Ambulatory Visit | Attending: Internal Medicine

## 2014-12-07 DIAGNOSIS — K5730 Diverticulosis of large intestine without perforation or abscess without bleeding: Secondary | ICD-10-CM | POA: Diagnosis not present

## 2014-12-07 DIAGNOSIS — F4310 Post-traumatic stress disorder, unspecified: Secondary | ICD-10-CM | POA: Insufficient documentation

## 2014-12-07 DIAGNOSIS — G3184 Mild cognitive impairment, so stated: Secondary | ICD-10-CM | POA: Diagnosis not present

## 2014-12-07 DIAGNOSIS — Z7982 Long term (current) use of aspirin: Secondary | ICD-10-CM | POA: Diagnosis not present

## 2014-12-07 DIAGNOSIS — Z79899 Other long term (current) drug therapy: Secondary | ICD-10-CM | POA: Diagnosis not present

## 2014-12-07 DIAGNOSIS — E119 Type 2 diabetes mellitus without complications: Secondary | ICD-10-CM | POA: Insufficient documentation

## 2014-12-07 DIAGNOSIS — I129 Hypertensive chronic kidney disease with stage 1 through stage 4 chronic kidney disease, or unspecified chronic kidney disease: Secondary | ICD-10-CM | POA: Insufficient documentation

## 2014-12-07 DIAGNOSIS — Q438 Other specified congenital malformations of intestine: Secondary | ICD-10-CM | POA: Diagnosis not present

## 2014-12-07 DIAGNOSIS — Z1211 Encounter for screening for malignant neoplasm of colon: Secondary | ICD-10-CM | POA: Diagnosis not present

## 2014-12-07 DIAGNOSIS — F209 Schizophrenia, unspecified: Secondary | ICD-10-CM | POA: Insufficient documentation

## 2014-12-07 DIAGNOSIS — Z794 Long term (current) use of insulin: Secondary | ICD-10-CM | POA: Insufficient documentation

## 2014-12-07 DIAGNOSIS — F17210 Nicotine dependence, cigarettes, uncomplicated: Secondary | ICD-10-CM | POA: Insufficient documentation

## 2014-12-07 DIAGNOSIS — G64 Other disorders of peripheral nervous system: Secondary | ICD-10-CM | POA: Diagnosis not present

## 2014-12-07 DIAGNOSIS — M1990 Unspecified osteoarthritis, unspecified site: Secondary | ICD-10-CM | POA: Insufficient documentation

## 2014-12-07 DIAGNOSIS — M25511 Pain in right shoulder: Secondary | ICD-10-CM | POA: Diagnosis not present

## 2014-12-07 DIAGNOSIS — Z6822 Body mass index (BMI) 22.0-22.9, adult: Secondary | ICD-10-CM | POA: Diagnosis not present

## 2014-12-07 HISTORY — PX: COLONOSCOPY: SHX5424

## 2014-12-07 LAB — GLUCOSE, CAPILLARY
GLUCOSE-CAPILLARY: 167 mg/dL — AB (ref 65–99)
GLUCOSE-CAPILLARY: 94 mg/dL (ref 65–99)
Glucose-Capillary: 35 mg/dL — CL (ref 65–99)

## 2014-12-07 SURGERY — COLONOSCOPY
Anesthesia: Moderate Sedation

## 2014-12-07 MED ORDER — MEPERIDINE HCL 100 MG/ML IJ SOLN
INTRAMUSCULAR | Status: AC
  Filled 2014-12-07: qty 2

## 2014-12-07 MED ORDER — SODIUM CHLORIDE 0.9 % IV SOLN
INTRAVENOUS | Status: DC
  Administered 2014-12-07: 10:00:00 via INTRAVENOUS

## 2014-12-07 MED ORDER — ONDANSETRON HCL 4 MG/2ML IJ SOLN
INTRAMUSCULAR | Status: AC
  Filled 2014-12-07: qty 2

## 2014-12-07 MED ORDER — MEPERIDINE HCL 100 MG/ML IJ SOLN
INTRAMUSCULAR | Status: DC | PRN
  Administered 2014-12-07: 50 mg via INTRAVENOUS

## 2014-12-07 MED ORDER — STERILE WATER FOR IRRIGATION IR SOLN
Status: DC | PRN
  Administered 2014-12-07: 11:00:00

## 2014-12-07 MED ORDER — DEXTROSE 50 % IV SOLN
25.0000 g | Freq: Once | INTRAVENOUS | Status: AC
  Administered 2014-12-07: 25 g via INTRAVENOUS

## 2014-12-07 MED ORDER — ONDANSETRON HCL 4 MG/2ML IJ SOLN
INTRAMUSCULAR | Status: DC | PRN
  Administered 2014-12-07: 4 mg via INTRAVENOUS

## 2014-12-07 MED ORDER — MIDAZOLAM HCL 5 MG/5ML IJ SOLN
INTRAMUSCULAR | Status: DC | PRN
  Administered 2014-12-07: 1 mg via INTRAVENOUS
  Administered 2014-12-07: 2 mg via INTRAVENOUS
  Administered 2014-12-07: 1 mg via INTRAVENOUS

## 2014-12-07 MED ORDER — MIDAZOLAM HCL 5 MG/5ML IJ SOLN
INTRAMUSCULAR | Status: AC
  Filled 2014-12-07: qty 10

## 2014-12-07 MED ORDER — DEXTROSE 50 % IV SOLN
INTRAVENOUS | Status: AC
  Filled 2014-12-07: qty 50

## 2014-12-07 NOTE — H&P (Signed)
@LOGO @   Primary Care Physician:  [name], MD Primary Gastroenterologist:  Dr. Gala Romney  Pre-Procedure History & Physical: HPI:  Allen Yu is a 69 y.o. male is here for a screening colonoscopy. No bowel symptoms. No family history of colon cancer. Negative colonoscopy 2004.  Past Medical History  Diagnosis Date  . Diabetes mellitus   . Chronic kidney disease     stones hx  . PTSD (post-traumatic stress disorder)   . GERD (gastroesophageal reflux disease)   . Chronic back pain   . Schizophrenia ([place])   . Hypertension   . Tuberculosis     MED TX X 1 YEAR YEARS AGO   . Arthritis   . Peripheral neuropathy [facility])     Past Surgical History  Procedure Laterality Date  . Hernia repair  rt ing  . Bladder surgery      02  . Back surgery      x2  . Appendectomy    . Cholecystectomy    . Shoulder surgery      RIGHT SHOULDER   . Inguinal hernia repair Left 11/03/2013    Procedure: HERNIA REPAIR INGUINAL ADULT;  Surgeon: Gayland Curry, MD;  Location: [facility];  Service: General;  Laterality: Left;  . Colonoscopy      Prior to Admission medications   Medication Sig Start Date End Date Taking? Authorizing Provider  aspirin EC 81 MG tablet Take 81 mg by mouth at bedtime.    Yes Historical Provider, MD  atorvastatin (LIPITOR) 20 MG tablet Take 20 mg by mouth at bedtime.    Yes Historical Provider, MD  ibuprofen (ADVIL,MOTRIN) 200 MG tablet Take 400 mg by mouth every 6 (six) hours as needed (pain).   Yes Historical Provider, MD  insulin lispro (HUMALOG) 100 UNIT/ML injection Inject 10 Units into the skin 3 (three) times daily before meals.   Yes Historical Provider, MD  insulin NPH-regular Human (NOVOLIN 70/30) (70-30) 100 UNIT/ML injection Inject 70 Units into the skin at bedtime.   Yes Historical Provider, MD  losartan (COZAAR) 50 MG tablet Take 50 mg by mouth at bedtime.    Yes Historical Provider, MD  polyethylene glycol-electrolytes (TRILYTE) 420 G solution Take 4,000 mLs by  mouth as directed. 11/15/14  Yes Daneil Dolin, MD  Tamsulosin HCl (FLOMAX) 0.4 MG CAPS Take 0.4 mg by mouth daily after supper.    Yes Historical Provider, MD    Allergies as of 11/15/2014 - Review Complete 11/11/2014  Allergen Reaction Noted  . Bee venom Other (See Comments) 08/06/2011  . Codeine Other (See Comments) 11/21/2010  . Diovan [valsartan] Other (See Comments) 11/21/2010    History reviewed. No pertinent family history.  Social History   Social History  . Marital Status: Married    Spouse Name: N/A  . Number of Children: N/A  . Years of Education: N/A   Occupational History  . Not on file.   Social History Main Topics  . Smoking status: Current Every Day Smoker -- 1.00 packs/day    Types: Cigarettes  . Smokeless tobacco: Not on file  . Alcohol Use: No  . Drug Use: Yes    Special: Marijuana     Comment: occasional use of marijuana   . Sexual Activity: No   Other Topics Concern  . Not on file   Social History Narrative    Review of Systems: See HPI, otherwise negative ROS  Physical Exam: BP 138/76 mmHg  Pulse 94  Temp(Src) 97.9 F (36.6 C) (  Oral)  Resp 18  Ht 6\' 1"  (1.854 m)  Wt 174 lb (78.926 kg)  BMI 22.96 kg/m2  SpO2 100% General:   Alert,  Well-developed, well-nourished, pleasant and cooperative in NAD Head:  Normocephalic and atraumatic. Neck:  Supple; no masses or thyromegaly. Lungs:  Clear throughout to auscultation.   No wheezes, crackles, or rhonchi. No acute distress. Heart:  Regular rate and rhythm; no murmurs, clicks, rubs,  or gallops. Abdomen:  Soft, nontender and nondistended. No masses, hepatosplenomegaly or hernias noted. Normal bowel sounds, without guarding, and without rebound.   Msk:  Symmetrical without gross deformities. Normal posture. Pulses:  Normal pulses noted. Extremities:  Without clubbing or edema.  Impression/Plan: Allen Yu is now here to undergo a screening colonoscopy.  Average risk screening  examination. Risks, benefits, limitations, imponderables and alternatives regarding colonoscopy have been reviewed with the patient. Questions have been answered. All parties agreeable.     Notice:  This dictation was prepared with Dragon dictation along with smaller phrase technology. Any transcriptional errors that result from this process are unintentional and may not be corrected upon review.

## 2014-12-07 NOTE — Op Note (Signed)
[facility] [address] [place], [address]   COLONOSCOPY PROCEDURE REPORT  PATIENT: Allen Yu, Allen Yu  MR#: [id number] BIRTHDATE: 08/28/1945 , 68  yrs. old GENDER: male ENDOSCOPIST: R.  Garfield Cornea, MD FACP [facility] REFERRED BY:Roy Willey Blade, M.D. PROCEDURE DATE:  December 14, 2014 PROCEDURE:   Colonoscopy, screening INDICATIONS:Average risk colorectal cancer screening evaluation. MEDICATIONS: Versed 4 mg IV and Demerol 50 mg IV in divided doses. Zofran 4 mg IV. ASA CLASS:       Class III  CONSENT: The risks, benefits, alternatives and imponderables including but not limited to bleeding, perforation as well as the possibility of a missed lesion have been reviewed.  The potential for biopsy, lesion removal, etc. have also been discussed. Questions have been answered.  All parties agreeable.  Please see the history and physical in the medical record for more information.  DESCRIPTION OF PROCEDURE:   After the risks benefits and alternatives of the procedure were thoroughly explained, informed consent was obtained.  The digital rectal exam revealed no abnormalities of the rectum.   The [id number]  endoscope was introduced through the anus and advanced to the cecum, which was identified by both the appendix and ileocecal valve. No adverse events experienced.   The quality of the prep was adequate  The instrument was then slowly withdrawn as the colon was fully examined. Estimated blood loss is zero unless otherwise noted in this procedure report.      COLON FINDINGS: Normal-appearing rectal mucosa.  Scattered left-sided diverticulosis; redundant colon.  The remainder of the colonic mucosa appeared normal.  Retroflexion was performed. .  Withdrawal time=7 minutes 0 seconds.  The scope was withdrawn and the procedure completed. COMPLICATIONS: There were no immediate complications.  ENDOSCOPIC IMPRESSION: Redundant colon. Colonic  diverticulosis.  RECOMMENDATIONS: I do not recommend a future colonoscopy unless patient develops symptoms.  eSigned:  R. Garfield Cornea, MD Rosalita Chessman [facility] Dec 14, 2014 11:10 AM   cc:  CPT CODES: ICD CODES:  The ICD and CPT codes recommended by this software are interpretations from the data that the clinical staff has captured with the software.  The verification of the translation of this report to the ICD and CPT codes and modifiers is the sole responsibility of the health care institution and practicing physician where this report was generated.  [employer] will not be held responsible for the validity of the ICD and CPT codes included on this report.  AMA assumes no liability for data contained or not contained herein. CPT is a Designer, television/film set of the [employer].

## 2014-12-07 NOTE — OR Nursing (Signed)
Patient arrived for colonoscopy. Stated he took his insulin last night. Accu check 35. Dr. Gala Romney notified and orders received and carried out.

## 2014-12-07 NOTE — Discharge Instructions (Signed)
Colonoscopy Discharge Instructions  Read the instructions outlined below and refer to this sheet in the next few weeks. These discharge instructions provide you with general information on caring for yourself after you leave the hospital. Your doctor may also give you specific instructions. While your treatment has been planned according to the most current medical practices available, unavoidable complications occasionally occur. If you have any problems or questions after discharge, call Dr. Gala Romney at [phone]. ACTIVITY  You may resume your regular activity, but move at a slower pace for the next 24 hours.   Take frequent rest periods for the next 24 hours.   Walking will help get rid of the air and reduce the bloated feeling in your belly (abdomen).   No driving for 24 hours (because of the medicine (anesthesia) used during the test).    Do not sign any important legal documents or operate any machinery for 24 hours (because of the anesthesia used during the test).  NUTRITION  Drink plenty of fluids.   You may resume your normal diet as instructed by your doctor.   Begin with a light meal and progress to your normal diet. Heavy or fried foods are harder to digest and may make you feel sick to your stomach (nauseated).   Avoid alcoholic beverages for 24 hours or as instructed.  MEDICATIONS  You may resume your normal medications unless your doctor tells you otherwise.  WHAT YOU CAN EXPECT TODAY  Some feelings of bloating in the abdomen.   Passage of more gas than usual.   Spotting of blood in your stool or on the toilet paper.  IF YOU HAD POLYPS REMOVED DURING THE COLONOSCOPY:  No aspirin products for 7 days or as instructed.   No alcohol for 7 days or as instructed.   Eat a soft diet for the next 24 hours.  FINDING OUT THE RESULTS OF YOUR TEST Not all test results are available during your visit. If your test results are not back during the visit, make an appointment  with your caregiver to find out the results. Do not assume everything is normal if you have not heard from your caregiver or the medical facility. It is important for you to follow up on all of your test results.  SEEK IMMEDIATE MEDICAL ATTENTION IF:  You have more than a spotting of blood in your stool.   Your belly is swollen (abdominal distention).   You are nauseated or vomiting.   You have a temperature over 101.   You have abdominal pain or discomfort that is severe or gets worse throughout the day.   Diverticulosis information provided  I do not recommend a future colonoscopy unless the patient develops new symptoms.  Diverticulosis Diverticulosis is the condition that develops when small pouches (diverticula) form in the wall of your colon. Your colon, or large intestine, is where water is absorbed and stool is formed. The pouches form when the inside layer of your colon pushes through weak spots in the outer layers of your colon. CAUSES  No one knows exactly what causes diverticulosis. RISK FACTORS  Being older than 56. Your risk for this condition increases with age. Diverticulosis is rare in people younger than 40 years. By age 30, almost everyone has it.  Eating a low-fiber diet.  Being frequently constipated.  Being overweight.  Not getting enough exercise.  Smoking.  Taking over-the-counter pain medicines, like aspirin and ibuprofen. SYMPTOMS  Most people with diverticulosis do not have symptoms. DIAGNOSIS  Because diverticulosis often has no symptoms, health care providers often discover the condition during an exam for other colon problems. In many cases, a health care provider will diagnose diverticulosis while using a flexible scope to examine the colon (colonoscopy). TREATMENT  If you have never developed an infection related to diverticulosis, you may not need treatment. If you have had an infection before, treatment may include:  Eating more fruits,  vegetables, and grains.  Taking a fiber supplement.  Taking a live bacteria supplement (probiotic).  Taking medicine to relax your colon. HOME CARE INSTRUCTIONS   Drink at least 6-8 glasses of water each day to prevent constipation.  Try not to strain when you have a bowel movement.  Keep all follow-up appointments. If you have had an infection before:  Increase the fiber in your diet as directed by your health care provider or dietitian.  Take a dietary fiber supplement if your health care provider approves.  Only take medicines as directed by your health care provider. SEEK MEDICAL CARE IF:   You have abdominal pain.  You have bloating.  You have cramps.  You have not gone to the bathroom in 3 days. SEEK IMMEDIATE MEDICAL CARE IF:   Your pain gets worse.  Yourbloating becomes very bad.  You have a fever or chills, and your symptoms suddenly get worse.  You begin vomiting.  You have bowel movements that are bloody or black. MAKE SURE YOU:  Understand these instructions.  Will watch your condition.  Will get help right away if you are not doing well or get worse.   This information is not intended to replace advice given to you by your health care provider. Make sure you discuss any questions you have with your health care provider.   Document Released: 10/06/2003 Document Revised: 01/13/2013 Document Reviewed: 12/03/2012 Elsevier Interactive Patient Education [employer].

## 2014-12-14 ENCOUNTER — Encounter (HOSPITAL_COMMUNITY): Payer: Self-pay | Admitting: Internal Medicine

## 2014-12-14 DIAGNOSIS — M7581 Other shoulder lesions, right shoulder: Secondary | ICD-10-CM | POA: Diagnosis not present

## 2014-12-14 DIAGNOSIS — M542 Cervicalgia: Secondary | ICD-10-CM | POA: Diagnosis not present

## 2015-01-07 DIAGNOSIS — M542 Cervicalgia: Secondary | ICD-10-CM | POA: Diagnosis not present

## 2015-01-12 DIAGNOSIS — M25511 Pain in right shoulder: Secondary | ICD-10-CM | POA: Diagnosis not present

## 2015-01-28 DIAGNOSIS — M25511 Pain in right shoulder: Secondary | ICD-10-CM | POA: Diagnosis not present

## 2015-03-02 DIAGNOSIS — E119 Type 2 diabetes mellitus without complications: Secondary | ICD-10-CM | POA: Diagnosis not present

## 2015-03-11 DIAGNOSIS — Z6822 Body mass index (BMI) 22.0-22.9, adult: Secondary | ICD-10-CM | POA: Diagnosis not present

## 2015-03-11 DIAGNOSIS — E119 Type 2 diabetes mellitus without complications: Secondary | ICD-10-CM | POA: Diagnosis not present

## 2015-03-11 DIAGNOSIS — R413 Other amnesia: Secondary | ICD-10-CM | POA: Diagnosis not present

## 2015-03-11 DIAGNOSIS — I10 Essential (primary) hypertension: Secondary | ICD-10-CM | POA: Diagnosis not present

## 2015-06-03 DIAGNOSIS — D638 Anemia in other chronic diseases classified elsewhere: Secondary | ICD-10-CM | POA: Diagnosis not present

## 2015-06-03 DIAGNOSIS — I10 Essential (primary) hypertension: Secondary | ICD-10-CM | POA: Diagnosis not present

## 2015-06-03 DIAGNOSIS — Z79899 Other long term (current) drug therapy: Secondary | ICD-10-CM | POA: Diagnosis not present

## 2015-06-03 DIAGNOSIS — F209 Schizophrenia, unspecified: Secondary | ICD-10-CM | POA: Diagnosis not present

## 2015-06-03 DIAGNOSIS — E119 Type 2 diabetes mellitus without complications: Secondary | ICD-10-CM | POA: Diagnosis not present

## 2015-06-03 DIAGNOSIS — R809 Proteinuria, unspecified: Secondary | ICD-10-CM | POA: Diagnosis not present

## 2015-06-10 DIAGNOSIS — E1129 Type 2 diabetes mellitus with other diabetic kidney complication: Secondary | ICD-10-CM | POA: Diagnosis not present

## 2015-06-10 DIAGNOSIS — G9009 Other idiopathic peripheral autonomic neuropathy: Secondary | ICD-10-CM | POA: Diagnosis not present

## 2015-06-10 DIAGNOSIS — E785 Hyperlipidemia, unspecified: Secondary | ICD-10-CM | POA: Diagnosis not present

## 2015-07-04 ENCOUNTER — Telehealth (HOSPITAL_COMMUNITY): Payer: Self-pay | Admitting: *Deleted

## 2015-07-04 NOTE — Telephone Encounter (Signed)
left voice message regarding an appointment. 

## 2015-07-08 ENCOUNTER — Emergency Department (HOSPITAL_COMMUNITY): Payer: Medicare Other

## 2015-07-08 ENCOUNTER — Encounter (HOSPITAL_COMMUNITY): Payer: Self-pay | Admitting: Emergency Medicine

## 2015-07-08 ENCOUNTER — Inpatient Hospital Stay (HOSPITAL_COMMUNITY)
Admission: EM | Admit: 2015-07-08 | Discharge: 2015-07-10 | DRG: 637 | Disposition: A | Discharge status: Home / Self Care | Payer: Medicare Other | Attending: Internal Medicine | Admitting: Internal Medicine

## 2015-07-08 DIAGNOSIS — I10 Essential (primary) hypertension: Secondary | ICD-10-CM | POA: Diagnosis present

## 2015-07-08 DIAGNOSIS — R4182 Altered mental status, unspecified: Secondary | ICD-10-CM | POA: Diagnosis present

## 2015-07-08 DIAGNOSIS — E1122 Type 2 diabetes mellitus with diabetic chronic kidney disease: Secondary | ICD-10-CM | POA: Diagnosis present

## 2015-07-08 DIAGNOSIS — S062X0A Diffuse traumatic brain injury without loss of consciousness, initial encounter: Secondary | ICD-10-CM | POA: Diagnosis not present

## 2015-07-08 DIAGNOSIS — F23 Brief psychotic disorder: Secondary | ICD-10-CM | POA: Diagnosis not present

## 2015-07-08 DIAGNOSIS — T383X5A Adverse effect of insulin and oral hypoglycemic [antidiabetic] drugs, initial encounter: Secondary | ICD-10-CM | POA: Diagnosis present

## 2015-07-08 DIAGNOSIS — N182 Chronic kidney disease, stage 2 (mild): Secondary | ICD-10-CM | POA: Diagnosis present

## 2015-07-08 DIAGNOSIS — I639 Cerebral infarction, unspecified: Secondary | ICD-10-CM | POA: Diagnosis not present

## 2015-07-08 DIAGNOSIS — E109 Type 1 diabetes mellitus without complications: Secondary | ICD-10-CM

## 2015-07-08 DIAGNOSIS — Z794 Long term (current) use of insulin: Secondary | ICD-10-CM

## 2015-07-08 DIAGNOSIS — E11649 Type 2 diabetes mellitus with hypoglycemia without coma: Secondary | ICD-10-CM | POA: Diagnosis not present

## 2015-07-08 DIAGNOSIS — R911 Solitary pulmonary nodule: Secondary | ICD-10-CM | POA: Diagnosis present

## 2015-07-08 DIAGNOSIS — G9340 Encephalopathy, unspecified: Secondary | ICD-10-CM | POA: Diagnosis present

## 2015-07-08 DIAGNOSIS — K219 Gastro-esophageal reflux disease without esophagitis: Secondary | ICD-10-CM | POA: Diagnosis present

## 2015-07-08 DIAGNOSIS — N179 Acute kidney failure, unspecified: Secondary | ICD-10-CM | POA: Diagnosis not present

## 2015-07-08 DIAGNOSIS — F4310 Post-traumatic stress disorder, unspecified: Secondary | ICD-10-CM | POA: Diagnosis present

## 2015-07-08 DIAGNOSIS — F17210 Nicotine dependence, cigarettes, uncomplicated: Secondary | ICD-10-CM | POA: Diagnosis present

## 2015-07-08 DIAGNOSIS — F29 Unspecified psychosis not due to a substance or known physiological condition: Secondary | ICD-10-CM | POA: Diagnosis not present

## 2015-07-08 DIAGNOSIS — S06350A Traumatic hemorrhage of left cerebrum without loss of consciousness, initial encounter: Secondary | ICD-10-CM

## 2015-07-08 DIAGNOSIS — F17200 Nicotine dependence, unspecified, uncomplicated: Secondary | ICD-10-CM | POA: Diagnosis present

## 2015-07-08 DIAGNOSIS — S06320A Contusion and laceration of left cerebrum without loss of consciousness, initial encounter: Secondary | ICD-10-CM

## 2015-07-08 DIAGNOSIS — D638 Anemia in other chronic diseases classified elsewhere: Secondary | ICD-10-CM | POA: Diagnosis present

## 2015-07-08 DIAGNOSIS — E119 Type 2 diabetes mellitus without complications: Secondary | ICD-10-CM

## 2015-07-08 DIAGNOSIS — D649 Anemia, unspecified: Secondary | ICD-10-CM | POA: Diagnosis present

## 2015-07-08 DIAGNOSIS — Z720 Tobacco use: Secondary | ICD-10-CM | POA: Diagnosis present

## 2015-07-08 DIAGNOSIS — E160 Drug-induced hypoglycemia without coma: Secondary | ICD-10-CM | POA: Insufficient documentation

## 2015-07-08 DIAGNOSIS — F209 Schizophrenia, unspecified: Secondary | ICD-10-CM | POA: Diagnosis present

## 2015-07-08 DIAGNOSIS — I6523 Occlusion and stenosis of bilateral carotid arteries: Secondary | ICD-10-CM | POA: Diagnosis not present

## 2015-07-08 DIAGNOSIS — N183 Chronic kidney disease, stage 3 unspecified: Secondary | ICD-10-CM | POA: Diagnosis present

## 2015-07-08 DIAGNOSIS — N189 Chronic kidney disease, unspecified: Secondary | ICD-10-CM | POA: Diagnosis present

## 2015-07-08 DIAGNOSIS — R410 Disorientation, unspecified: Secondary | ICD-10-CM | POA: Diagnosis not present

## 2015-07-08 DIAGNOSIS — E1159 Type 2 diabetes mellitus with other circulatory complications: Secondary | ICD-10-CM

## 2015-07-08 DIAGNOSIS — I129 Hypertensive chronic kidney disease with stage 1 through stage 4 chronic kidney disease, or unspecified chronic kidney disease: Secondary | ICD-10-CM | POA: Diagnosis present

## 2015-07-08 HISTORY — DX: Occlusion and stenosis of unspecified carotid artery: I65.29

## 2015-07-08 HISTORY — DX: Solitary pulmonary nodule: R91.1

## 2015-07-08 LAB — URINALYSIS, ROUTINE W REFLEX MICROSCOPIC
Bilirubin Urine: NEGATIVE
Glucose, UA: NEGATIVE mg/dL
HGB URINE DIPSTICK: NEGATIVE
Ketones, ur: NEGATIVE mg/dL
LEUKOCYTES UA: NEGATIVE
NITRITE: NEGATIVE
PROTEIN: NEGATIVE mg/dL
SPECIFIC GRAVITY, URINE: 1.025 (ref 1.005–1.030)
pH: 5 (ref 5.0–8.0)

## 2015-07-08 LAB — CBG MONITORING, ED
GLUCOSE-CAPILLARY: 108 mg/dL — AB (ref 65–99)
GLUCOSE-CAPILLARY: 108 mg/dL — AB (ref 65–99)
GLUCOSE-CAPILLARY: 223 mg/dL — AB (ref 65–99)
GLUCOSE-CAPILLARY: 56 mg/dL — AB (ref 65–99)
Glucose-Capillary: 90 mg/dL (ref 65–99)

## 2015-07-08 LAB — COMPREHENSIVE METABOLIC PANEL
ALBUMIN: 4.2 g/dL (ref 3.5–5.0)
ALK PHOS: 95 U/L (ref 38–126)
ALT: 17 U/L (ref 17–63)
AST: 19 U/L (ref 15–41)
Anion gap: 4 — ABNORMAL LOW (ref 5–15)
BILIRUBIN TOTAL: 0.9 mg/dL (ref 0.3–1.2)
BUN: 36 mg/dL — AB (ref 6–20)
CALCIUM: 8.8 mg/dL — AB (ref 8.9–10.3)
CO2: 24 mmol/L (ref 22–32)
CREATININE: 1.85 mg/dL — AB (ref 0.61–1.24)
Chloride: 108 mmol/L (ref 101–111)
GFR calc Af Amer: 41 mL/min — ABNORMAL LOW (ref >60)
GFR calc non Af Amer: 36 mL/min — ABNORMAL LOW (ref >60)
GLUCOSE: 90 mg/dL (ref 65–99)
Potassium: 3.6 mmol/L (ref 3.5–5.1)
SODIUM: 136 mmol/L (ref 135–145)
TOTAL PROTEIN: 6.9 g/dL (ref 6.5–8.1)

## 2015-07-08 LAB — I-STAT CHEM 8, ED
BUN: 46 mg/dL — AB (ref 6–20)
CHLORIDE: 104 mmol/L (ref 101–111)
CREATININE: 1.9 mg/dL — AB (ref 0.61–1.24)
Calcium, Ion: 1.18 mmol/L (ref 1.13–1.30)
Glucose, Bld: 84 mg/dL (ref 65–99)
HEMATOCRIT: 30 % — AB (ref 39.0–52.0)
Hemoglobin: 10.2 g/dL — ABNORMAL LOW (ref 13.0–17.0)
POTASSIUM: 4.8 mmol/L (ref 3.5–5.1)
Sodium: 139 mmol/L (ref 135–145)
TCO2: 25 mmol/L (ref 0–100)

## 2015-07-08 LAB — RAPID URINE DRUG SCREEN, HOSP PERFORMED
Amphetamines: NOT DETECTED
BARBITURATES: NOT DETECTED
Benzodiazepines: NOT DETECTED
Cocaine: NOT DETECTED
Opiates: NOT DETECTED
Tetrahydrocannabinol: POSITIVE — AB

## 2015-07-08 LAB — APTT: APTT: 25 s (ref 24–37)

## 2015-07-08 LAB — CBC WITH DIFFERENTIAL/PLATELET
BASOS ABS: 0 10*3/uL (ref 0.0–0.1)
BASOS PCT: 0 %
EOS ABS: 0.2 10*3/uL (ref 0.0–0.7)
Eosinophils Relative: 3 %
HCT: 29.3 % — ABNORMAL LOW (ref 39.0–52.0)
HEMOGLOBIN: 10.4 g/dL — AB (ref 13.0–17.0)
Lymphocytes Relative: 29 %
Lymphs Abs: 1.9 10*3/uL (ref 0.7–4.0)
MCH: 32 pg (ref 26.0–34.0)
MCHC: 35.5 g/dL (ref 30.0–36.0)
MCV: 90.2 fL (ref 78.0–100.0)
MONOS PCT: 6 %
Monocytes Absolute: 0.4 10*3/uL (ref 0.1–1.0)
NEUTROS PCT: 62 %
Neutro Abs: 4 10*3/uL (ref 1.7–7.7)
Platelets: 204 10*3/uL (ref 150–400)
RBC: 3.25 MIL/uL — ABNORMAL LOW (ref 4.22–5.81)
RDW: 13.2 % (ref 11.5–15.5)
WBC: 6.5 10*3/uL (ref 4.0–10.5)

## 2015-07-08 LAB — PROTIME-INR
INR: 1.11 (ref 0.00–1.49)
PROTHROMBIN TIME: 14.5 s (ref 11.6–15.2)

## 2015-07-08 LAB — ETHANOL: Alcohol, Ethyl (B): <5 mg/dL (ref <5)

## 2015-07-08 MED ORDER — DEXTROSE 50 % IV SOLN
INTRAVENOUS | Status: AC
  Filled 2015-07-08: qty 50

## 2015-07-08 MED ORDER — IOPAMIDOL (ISOVUE-370) INJECTION 76%
100.0000 mL | Freq: Once | INTRAVENOUS | Status: AC | PRN
  Administered 2015-07-08: 100 mL via INTRAVENOUS

## 2015-07-08 MED ORDER — DEXTROSE 50 % IV SOLN
50.0000 mL | Freq: Once | INTRAVENOUS | Status: AC
  Administered 2015-07-08: 50 mL via INTRAVENOUS

## 2015-07-08 NOTE — ED Provider Notes (Signed)
CSN: [id number]     Arrival date & time 07/08/15  2035 History   First MD Initiated Contact with Patient 07/08/15 2043     Chief Complaint  Patient presents with  . Altered Mental Status     (Consider location/radiation/quality/duration/timing/severity/associated sxs/prior Treatment) HPI Comments: Level 5 caveat for altered mental status. Patient presents by EMS with acute change in mental status. He was found slumped over in a chair outside. Blood sugar was 112. He was normal aproximately one hour ago at 7:30 PM. Patient is nonverbal but was talking for EMS. He is tearful and indicates that he has pain in his left leg which seems to be chronic. Patient will not follow commands but is moving all extremities spontaneously. Chart review shows a history of schizophrenia and PTSD but he does not appear to be on any psychotropic medications.  Patient is a 70 y.o. male presenting with altered mental status. The history is provided by the EMS personnel and the patient. The history is limited by the condition of the patient.  Altered Mental Status   Past Medical History  Diagnosis Date  . Diabetes mellitus   . Chronic kidney disease     stones hx  . PTSD (post-traumatic stress disorder)   . GERD (gastroesophageal reflux disease)   . Chronic back pain   . Schizophrenia ([place])   . Hypertension   . Tuberculosis     MED TX X 1 YEAR YEARS AGO   . Arthritis   . Peripheral neuropathy [facility])    Past Surgical History  Procedure Laterality Date  . Hernia repair  rt ing  . Bladder surgery      02  . Back surgery      x2  . Appendectomy    . Cholecystectomy    . Shoulder surgery      RIGHT SHOULDER   . Inguinal hernia repair Left 11/03/2013    Procedure: HERNIA REPAIR INGUINAL ADULT;  Surgeon: Gayland Curry, MD;  Location: [place];  Service: General;  Laterality: Left;  . Colonoscopy    . Colonoscopy N/A 12/07/2014    Procedure: COLONOSCOPY;  Surgeon: Daneil Dolin, MD;  Location[facility];  Service: Endoscopy;  Laterality: N/A;  10:30 Am   History reviewed. No pertinent family history. Social History  Substance Use Topics  . Smoking status: Current Every Day Smoker -- 1.00 packs/day    Types: Cigarettes  . Smokeless tobacco: None  . Alcohol Use: No    Review of Systems  Unable to perform ROS: Mental status change      Allergies  Bee venom; Codeine; and Diovan  Home Medications   Prior to Admission medications   Medication Sig Start Date End Date Taking? Authorizing Provider  aspirin EC 81 MG tablet Take 81 mg by mouth at bedtime.     Historical Provider, MD  atorvastatin (LIPITOR) 40 MG tablet Take 40 mg by mouth daily. 06/10/15   Historical Provider, MD  gabapentin (NEURONTIN) 600 MG tablet Take 600 mg by mouth 2 (two) times daily. 06/10/15   Historical Provider, MD  ibuprofen (ADVIL,MOTRIN) 200 MG tablet Take 400 mg by mouth every 6 (six) hours as needed (pain).    Historical Provider, MD  insulin lispro (HUMALOG) 100 UNIT/ML injection Inject 10 Units into the skin 3 (three) times daily before meals.    Historical Provider, MD  insulin NPH-regular Human (NOVOLIN 70/30) (70-30) 100 UNIT/ML injection Inject 70 Units into the skin at bedtime.  Historical Provider, MD  losartan (COZAAR) 50 MG tablet Take 50 mg by mouth at bedtime.     Historical Provider, MD  polyethylene glycol-electrolytes (TRILYTE) 420 G solution Take 4,000 mLs by mouth as directed. 11/15/14   Daneil Dolin, MD  Tamsulosin HCl (FLOMAX) 0.4 MG CAPS Take 0.4 mg by mouth daily after supper.     Historical Provider, MD   BP 116/65 mmHg  Pulse 67  Temp(Src) 98.1 F (36.7 C) (Oral)  Resp 16  Ht 6\' 1"  (1.854 m)  Wt 151 lb 0.2 oz (68.5 kg)  BMI 19.93 kg/m2  SpO2 100% Physical Exam  Constitutional: He appears well-developed and well-nourished. No distress.  HENT:  Head: Normocephalic and atraumatic.  Mouth/Throat: Oropharynx is clear and moist. No oropharyngeal exudate.  tearful  Eyes:  Conjunctivae and EOM are normal. Pupils are equal, round, and reactive to light.  Neck: Normal range of motion. Neck supple.  No meningismus  Cardiovascular: Normal rate, regular rhythm and normal heart sounds.   No murmur heard. Pulmonary/Chest: Effort normal and breath sounds normal. No respiratory distress.  Abdominal: Soft. There is no tenderness. There is no rebound and no guarding.  Musculoskeletal: Normal range of motion. He exhibits no edema or tenderness.  Neurological: He is alert. No cranial nerve deficit.  Patient initially will not speak. He did whisper few words that he is hurting all over. He will not follow commands. He is moving all extremities spontaneously. He will not smile to assess facial asymmetry. No clonus.     ED Course  Procedures (including critical care time) Labs Review Labs Reviewed  COMPREHENSIVE METABOLIC PANEL - Abnormal; Notable for the following:    BUN 36 (*)    Creatinine, Ser 1.85 (*)    Calcium 8.8 (*)    GFR calc non Af Amer 36 (*)    GFR calc Af Amer 41 (*)    Anion gap 4 (*)    All other components within normal limits  URINE RAPID DRUG SCREEN, HOSP PERFORMED - Abnormal; Notable for the following:    Tetrahydrocannabinol POSITIVE (*)    All other components within normal limits  CBC WITH DIFFERENTIAL/PLATELET - Abnormal; Notable for the following:    RBC 3.25 (*)    Hemoglobin 10.4 (*)    HCT 29.3 (*)    All other components within normal limits  GLUCOSE, CAPILLARY - Abnormal; Notable for the following:    Glucose-Capillary 189 (*)    All other components within normal limits  I-STAT CHEM 8, ED - Abnormal; Notable for the following:    BUN 46 (*)    Creatinine, Ser 1.90 (*)    Hemoglobin 10.2 (*)    HCT 30.0 (*)    All other components within normal limits  CBG MONITORING, ED - Abnormal; Notable for the following:    Glucose-Capillary 108 (*)    All other components within normal limits  CBG MONITORING, ED - Abnormal; Notable for  the following:    Glucose-Capillary 108 (*)    All other components within normal limits  CBG MONITORING, ED - Abnormal; Notable for the following:    Glucose-Capillary 56 (*)    All other components within normal limits  CBG MONITORING, ED - Abnormal; Notable for the following:    Glucose-Capillary 223 (*)    All other components within normal limits  ETHANOL  URINALYSIS, ROUTINE W REFLEX MICROSCOPIC (NOT AT [facility])  PROTIME-INR  APTT  HEMOGLOBIN A1C  TSH  BASIC METABOLIC PANEL  FERRITIN  IRON AND TIBC  VITAMIN B12  FOLATE RBC  I-STAT TROPOININ, ED  CBG MONITORING, ED  POCT I-STAT TROPONIN I    Imaging Review Ct Angio Head W/cm &/or Wo Cm  07/09/2015  EXAM: CT ANGIOGRAPHY HEAD AND NECK TECHNIQUE: Multidetector CT imaging of the head and neck was performed using the standard protocol during bolus administration of intravenous contrast. Multiplanar CT image reconstructions and MIPs were obtained to evaluate the vascular anatomy. Carotid stenosis measurements (when applicable) are obtained utilizing NASCET criteria, using the distal internal carotid diameter as the denominator. CONTRAST:  100 cc of Isovue 370. COMPARISON:  Prior CT from earlier same day. FINDINGS: CTA NECK Aortic arch: Valuation of the aortic arch limited due to timing of the contrast bolus. Similarly, origin of the great vessels not well evaluated. Scattered atheromatous plaque within the arch itself. Subclavian arteries not well evaluated on this exam due to timing the contrast bolus. Right carotid system: Origin of the right common carotid artery not well seen due to streak artifact from majority of the contrast in the venous system. Right common carotid artery grossly patent distally to the bifurcation without definite stenosis or acute abnormality, although evaluation limited by motion and contrast bolus timing. Mild scattered plaque about the right bifurcation/ proximal right ICA. Associated short-segment stenosis of  approximately 75% by NASCET criteria. Distally, right ICA patent to the skullbase. Atheromatous plaque within the distal right ICA with mild to moderate multi focal narrowing. Left carotid system: Left common carotid artery not well evaluated proximally due to timing of the contrast bolus. Left common carotid artery grossly patent to the bifurcation. Scattered calcified plaque about the left bifurcation/proximal left ICA without flow limiting stenosis. Left ICA patent to the skullbase. Scattered eccentric plaque within the distal left ICA without significant narrowing. Vertebral arteries:Origin of the vertebral arteries from the subclavian arteries. Proximally, vertebral arteries not well evaluated due to contrast bolus timing. Vertebral arteries grossly patent to the skullbase without obvious stenosis or other acute abnormality. Skeleton: Patient is status post ACDF at C4 through C7. No worrisome lytic or blastic osseous lesions. Other neck: Emphysematous changes noted within the visualized lungs. Approximately 1 cm spiculated nodule within the right upper lobe (series 6, image 23). Visualized lungs are otherwise clear. Visualized superior mediastinum within normal limits. Thyroid grossly unremarkable. No definite adenopathy within the neck. No acute soft tissue abnormality. CTA HEAD Anterior circulation: Multi focal atheromatous irregularity throughout the horizontal petrous right ICA, which is severely attenuated. This is not well evaluated on this motion degraded study. Extensive atheromatous plaque throughout the cavernous right ICA with moderate to severe multi focal narrowing. Supraclinoid segment is widely patent. Petrous left ICA widely patent. Extensive calcified plaque throughout the cavernous left ICA with moderate multi focal narrowing. Supraclinoid ICA patent. Left A1 segment dominant and patent. Right A1 segment hypoplastic and/ or absent. Anterior communicating artery and anterior cerebral arteries  opacified. Left M1 segment widely patent without stenosis or occlusion. Left MCA bifurcation normal. No proximal M2 branch stenosis or occlusion. Distal left MCA branches opacified with atheromatous irregularity. Right M1 segment patent without stenosis or occlusion. Right MCA bifurcation within normal limits. No proximal M2 branch occlusion. Distal right MCA branches opacified with multi focal atheromatous irregularity. Posterior circulation: Vertebral arteries patent to the vertebrobasilar junction. Left vertebral artery dominant. Posterior inferior cerebral arteries patent proximally. Basilar artery patent to its distal aspect. Superior cerebral arteries patent bilaterally. Both of the posterior cerebral arteries arise knee basilar artery and  are well opacified to their distal aspects. Venous sinuses: Grossly patent without venous sinus thrombosis. Anatomic variants: Hypoplastic/absent right A1 segment. No aneurysm or vascular malformation. Delayed phase: No pathologic enhancement. Possible small focus of hemorrhage within the parasagittal anterior left frontal lobe again noted. IMPRESSION: CTA NECK IMPRESSION: 1. Limited study by motion artifact and timing of the contrast bolus. 2. Atheromatous plaque at the proximal right ICA with short-segment stenosis of approximately 75% by NASCET criteria. 3. Additional atheromatous plaque at the distal right ICA with moderate multi focal narrowing (up to approximately 50%). 4. Atheromatous plaque about the left carotid bifurcation without high-grade flow-limiting stenosis. Additional atheromatous plaque within the distal left ICA without significant stenosis. 5. Grossly patent vertebral arteries within the neck. 6. Emphysema. 7. 1 cm spiculated nodular opacity within the right upper lobe, indeterminate. Consider one of the following in 3 months for both low-risk and high-risk individuals: (a) repeat chest CT, (b) follow-up PET-CT, or (c) tissue sampling. This  recommendation follows the consensus statement: Guidelines for Management of Incidental Pulmonary Nodules Detected on CT Images:From the Fleischner Society 2017; published online before print (10.1148/radiol.SG:5268862). CTA HEAD IMPRESSION: 1. Negative for emergent large vessel occlusion. 2. 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 Flow within the horizontal petrous right ICA in particular is severely attenuated, and possibly nearly occluded. 3. Atheromatous plaque throughout the cavernous left ICA with moderate multifocal stenosis. Anterior cerebral artery supplied via the left internal carotid artery system as the right A1 segment is hypoplastic/absent. 4. Patent vertebrobasilar system without high-grade flow-limiting stenosis. Electronically Signed   By: Jeannine Boga M.D.   On: 07/09/2015 00:28   Ct Head Wo Contrast  07/08/2015  CLINICAL DATA:  Nonverbal in confusion with onset 45 minutes ago. History of diabetes and chronic kidney disease. Code stroke. EXAM: CT HEAD WITHOUT CONTRAST TECHNIQUE: Contiguous axial images were obtained from the base of the skull through the vertex without intravenous contrast. COMPARISON:  11/09/2014 FINDINGS: Diffuse cerebral atrophy. No ventricular dilatation. Low-attenuation changes in the deep white matter consistent with small vessel ischemia. There is a tiny focus of increased density in the left anterior frontal lobe white matter. This was not present previously and may represent a tiny focal area of acute intraparenchymal hemorrhage. No abnormal extra-axial fluid collections. No mass effect or midline shift. Gray-white matter junctions are distinct. Basal cisterns are not effaced. Basal ganglia calcifications. Calvarium appears intact. Mild medial right orbital wall deformity possibly representing old fracture deformity. Mastoid air cells and paranasal sinuses are not opacified.  Vascular calcifications. Hearing aids bilaterally. IMPRESSION: Possible tiny focus of acute intraparenchymal hemorrhage in the left anterior frontal lobe. Otherwise, no acute changes are demonstrated. Chronic atrophy and small vessel ischemic changes. These results were called by telephone at the time of interpretation on 07/08/2015 at 9:12 pm to Dr. Ezequiel Essex , who verbally acknowledged these results. Electronically Signed   By: Lucienne Capers M.D.   On: 07/08/2015 21:17   Ct Angio Neck W/cm &/or Wo/cm  07/09/2015  EXAM: CT ANGIOGRAPHY HEAD AND NECK TECHNIQUE: Multidetector CT imaging of the head and neck was performed using the standard protocol during bolus administration of intravenous contrast. Multiplanar CT image reconstructions and MIPs were obtained to evaluate the vascular anatomy. Carotid stenosis measurements (when applicable) are obtained utilizing NASCET criteria, using the distal internal carotid diameter as the denominator. CONTRAST:  100 cc of Isovue 370. COMPARISON:  Prior CT from earlier same day.  FINDINGS: CTA NECK Aortic arch: Valuation of the aortic arch limited due to timing of the contrast bolus. Similarly, origin of the great vessels not well evaluated. Scattered atheromatous plaque within the arch itself. Subclavian arteries not well evaluated on this exam due to timing the contrast bolus. Right carotid system: Origin of the right common carotid artery not well seen due to streak artifact from majority of the contrast in the venous system. Right common carotid artery grossly patent distally to the bifurcation without definite stenosis or acute abnormality, although evaluation limited by motion and contrast bolus timing. Mild scattered plaque about the right bifurcation/ proximal right ICA. Associated short-segment stenosis of approximately 75% by NASCET criteria. Distally, right ICA patent to the skullbase. Atheromatous plaque within the distal right ICA with mild to moderate  multi focal narrowing. Left carotid system: Left common carotid artery not well evaluated proximally due to timing of the contrast bolus. Left common carotid artery grossly patent to the bifurcation. Scattered calcified plaque about the left bifurcation/proximal left ICA without flow limiting stenosis. Left ICA patent to the skullbase. Scattered eccentric plaque within the distal left ICA without significant narrowing. Vertebral arteries:Origin of the vertebral arteries from the subclavian arteries. Proximally, vertebral arteries not well evaluated due to contrast bolus timing. Vertebral arteries grossly patent to the skullbase without obvious stenosis or other acute abnormality. Skeleton: Patient is status post ACDF at C4 through C7. No worrisome lytic or blastic osseous lesions. Other neck: Emphysematous changes noted within the visualized lungs. Approximately 1 cm spiculated nodule within the right upper lobe (series 6, image 23). Visualized lungs are otherwise clear. Visualized superior mediastinum within normal limits. Thyroid grossly unremarkable. No definite adenopathy within the neck. No acute soft tissue abnormality. CTA HEAD Anterior circulation: Multi focal atheromatous irregularity throughout the horizontal petrous right ICA, which is severely attenuated. This is not well evaluated on this motion degraded study. Extensive atheromatous plaque throughout the cavernous right ICA with moderate to severe multi focal narrowing. Supraclinoid segment is widely patent. Petrous left ICA widely patent. Extensive calcified plaque throughout the cavernous left ICA with moderate multi focal narrowing. Supraclinoid ICA patent. Left A1 segment dominant and patent. Right A1 segment hypoplastic and/ or absent. Anterior communicating artery and anterior cerebral arteries opacified. Left M1 segment widely patent without stenosis or occlusion. Left MCA bifurcation normal. No proximal M2 branch stenosis or occlusion. Distal  left MCA branches opacified with atheromatous irregularity. Right M1 segment patent without stenosis or occlusion. Right MCA bifurcation within normal limits. No proximal M2 branch occlusion. Distal right MCA branches opacified with multi focal atheromatous irregularity. Posterior circulation: Vertebral arteries patent to the vertebrobasilar junction. Left vertebral artery dominant. Posterior inferior cerebral arteries patent proximally. Basilar artery patent to its distal aspect. Superior cerebral arteries patent bilaterally. Both of the posterior cerebral arteries arise knee basilar artery and are well opacified to their distal aspects. Venous sinuses: Grossly patent without venous sinus thrombosis. Anatomic variants: Hypoplastic/absent right A1 segment. No aneurysm or vascular malformation. Delayed phase: No pathologic enhancement. Possible small focus of hemorrhage within the parasagittal anterior left frontal lobe again noted. IMPRESSION: CTA NECK IMPRESSION: 1. Limited study by motion artifact and timing of the contrast bolus. 2. Atheromatous plaque at the proximal right ICA with short-segment stenosis of approximately 75% by NASCET criteria. 3. Additional atheromatous plaque at the distal right ICA with moderate multi focal narrowing (up to approximately 50%). 4. Atheromatous plaque about the left carotid bifurcation without high-grade flow-limiting stenosis. Additional atheromatous plaque within the  distal left ICA without significant stenosis. 5. Grossly patent vertebral arteries within the neck. 6. Emphysema. 7. 1 cm spiculated nodular opacity within the right upper lobe, indeterminate. Consider one of the following in 3 months for both low-risk and high-risk individuals: (a) repeat chest CT, (b) follow-up PET-CT, or (c) tissue sampling. This recommendation follows the consensus statement: Guidelines for Management of Incidental Pulmonary Nodules Detected on CT Images:From the Fleischner Society 2017;  published online before print (10.1148/radiol.IJ:2314499). CTA HEAD IMPRESSION: 1. Negative for emergent large vessel occlusion. 2. 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 Flow within the horizontal petrous right ICA in particular is severely attenuated, and possibly nearly occluded. 3. Atheromatous plaque throughout the cavernous left ICA with moderate multifocal stenosis. Anterior cerebral artery supplied via the left internal carotid artery system as the right A1 segment is hypoplastic/absent. 4. Patent vertebrobasilar system without high-grade flow-limiting stenosis. Electronically Signed   By: Jeannine Boga M.D.   On: 07/09/2015 00:28   I have personally reviewed and evaluated these images and lab results as part of my medical decision-making.   EKG Interpretation None      MDM   Final diagnoses:  Altered mental status, unspecified altered mental status type  Intraparenchymal hematoma of brain, left, without loss of consciousness, initial encounter ([place])   Acute mental status change without localization of neurological exam. Code Stroke activated on arrival.  No documented hypoglycemia but family did give sugar before calling EMS.  CT head has possible tiny focus of intraparenchymal hemorrhage and left anterior frontal lobe. D/w Dr. Lavone Neri of teleneurology. he feels patient's presentation is likely postictal from a hypoglycemic seizure. Intracranial hemorrhage does not explain his presentation. Recommends emergent CTA to look for large vessel occlusion.  D/w neurology Dr. Gifford Shave.  He does not feel patient needs to be transferred based on small IPH. Recommends MRI  Neurology consult complete by Dr. Lavone Neri. He feels patient has encephalopathy from hypoglycemic attack with possible seizure and postictal state. Does not feel left ICH is responsible for his aphasia. He agrees patient is not TPA candidate.  Recommends CTA of head and neck.  CT shows near occlusion of R ICA petrous portion. 75% R ICA stenosis in neck. D/w Dr. Gifford Shave.  This is not accessible for any type of intervention, recommends medical management. [place] for patient to stay at [facility].  Patient improving.  Remains essentially nonverbal but will follow commands and moves extremities equally in the ED.  Blood sugars have been stable. Lowest was 56 and D50 given.  Admission d/w Dr. Myna Hidalgo.  CRITICAL CARE Performed by: Ezequiel Essex Total critical care time: 35 minutes Critical care time was exclusive of separately billable procedures and treating other patients. Critical care was necessary to treat or prevent imminent or life-threatening deterioration. Critical care was time spent personally by me on the following activities: development of treatment plan with patient and/or surrogate as well as nursing, discussions with consultants, evaluation of patient's response to treatment, examination of patient, obtaining history from patient or surrogate, ordering and performing treatments and interventions, ordering and review of laboratory studies, ordering and review of radiographic studies, pulse oximetry and re-evaluation of patient's condition.   Ezequiel Essex, MD 07/09/15 [phone]

## 2015-07-08 NOTE — ED Notes (Signed)
Pt is non verbal, opens eyes and will respond to painful stimuli. Tears noted coming from eyes when speaking to pt. Pt is able to grip with both hands. No drift in upper extremities. Pt able to hold legs up with encouragement. Grimaces with pain when moving lower extremities

## 2015-07-08 NOTE — ED Notes (Signed)
MD Rancour notified of CBG of 56 at this time.

## 2015-07-08 NOTE — ED Notes (Signed)
CBG:90 

## 2015-07-08 NOTE — ED Notes (Signed)
Per ems pt was normal about an hour ago per wife and she found him drooling. Pt's blood sugar was 112 and neuro checks were normal. Pt is not responding verbally when we ask questions but responds to pain.

## 2015-07-09 ENCOUNTER — Encounter (HOSPITAL_COMMUNITY): Payer: Self-pay | Admitting: Internal Medicine

## 2015-07-09 ENCOUNTER — Observation Stay (HOSPITAL_COMMUNITY): Payer: Medicare Other

## 2015-07-09 DIAGNOSIS — N182 Chronic kidney disease, stage 2 (mild): Secondary | ICD-10-CM

## 2015-07-09 DIAGNOSIS — N179 Acute kidney failure, unspecified: Secondary | ICD-10-CM | POA: Diagnosis not present

## 2015-07-09 DIAGNOSIS — E119 Type 2 diabetes mellitus without complications: Secondary | ICD-10-CM

## 2015-07-09 DIAGNOSIS — Z794 Long term (current) use of insulin: Secondary | ICD-10-CM

## 2015-07-09 DIAGNOSIS — I639 Cerebral infarction, unspecified: Secondary | ICD-10-CM | POA: Diagnosis not present

## 2015-07-09 DIAGNOSIS — R911 Solitary pulmonary nodule: Secondary | ICD-10-CM | POA: Diagnosis not present

## 2015-07-09 DIAGNOSIS — E11649 Type 2 diabetes mellitus with hypoglycemia without coma: Secondary | ICD-10-CM | POA: Diagnosis present

## 2015-07-09 DIAGNOSIS — F4310 Post-traumatic stress disorder, unspecified: Secondary | ICD-10-CM | POA: Diagnosis not present

## 2015-07-09 DIAGNOSIS — F17200 Nicotine dependence, unspecified, uncomplicated: Secondary | ICD-10-CM | POA: Diagnosis not present

## 2015-07-09 DIAGNOSIS — F209 Schizophrenia, unspecified: Secondary | ICD-10-CM | POA: Diagnosis not present

## 2015-07-09 DIAGNOSIS — E1122 Type 2 diabetes mellitus with diabetic chronic kidney disease: Secondary | ICD-10-CM | POA: Diagnosis not present

## 2015-07-09 DIAGNOSIS — T383X5A Adverse effect of insulin and oral hypoglycemic [antidiabetic] drugs, initial encounter: Secondary | ICD-10-CM

## 2015-07-09 DIAGNOSIS — F17210 Nicotine dependence, cigarettes, uncomplicated: Secondary | ICD-10-CM | POA: Diagnosis present

## 2015-07-09 DIAGNOSIS — K219 Gastro-esophageal reflux disease without esophagitis: Secondary | ICD-10-CM | POA: Diagnosis present

## 2015-07-09 DIAGNOSIS — I10 Essential (primary) hypertension: Secondary | ICD-10-CM | POA: Diagnosis not present

## 2015-07-09 DIAGNOSIS — Z720 Tobacco use: Secondary | ICD-10-CM

## 2015-07-09 DIAGNOSIS — G9340 Encephalopathy, unspecified: Secondary | ICD-10-CM

## 2015-07-09 DIAGNOSIS — E160 Drug-induced hypoglycemia without coma: Secondary | ICD-10-CM

## 2015-07-09 DIAGNOSIS — R4182 Altered mental status, unspecified: Secondary | ICD-10-CM | POA: Diagnosis present

## 2015-07-09 DIAGNOSIS — I6789 Other cerebrovascular disease: Secondary | ICD-10-CM | POA: Diagnosis not present

## 2015-07-09 DIAGNOSIS — D649 Anemia, unspecified: Secondary | ICD-10-CM

## 2015-07-09 DIAGNOSIS — I129 Hypertensive chronic kidney disease with stage 1 through stage 4 chronic kidney disease, or unspecified chronic kidney disease: Secondary | ICD-10-CM | POA: Diagnosis not present

## 2015-07-09 LAB — GLUCOSE, CAPILLARY
GLUCOSE-CAPILLARY: 189 mg/dL — AB (ref 65–99)
GLUCOSE-CAPILLARY: 138 mg/dL — AB (ref 65–99)
GLUCOSE-CAPILLARY: 288 mg/dL — AB (ref 65–99)
Glucose-Capillary: 196 mg/dL — ABNORMAL HIGH (ref 65–99)
Glucose-Capillary: 197 mg/dL — ABNORMAL HIGH (ref 65–99)
Glucose-Capillary: 216 mg/dL — ABNORMAL HIGH (ref 65–99)
Glucose-Capillary: 136 mg/dL — ABNORMAL HIGH (ref 65–99)
Glucose-Capillary: 85 mg/dL (ref 65–99)

## 2015-07-09 LAB — IRON AND TIBC
Iron: 68 ug/dL (ref 45–182)
SATURATION RATIOS: 23 % (ref 17.9–39.5)
TIBC: 298 ug/dL (ref 250–450)
UIBC: 230 ug/dL

## 2015-07-09 LAB — FERRITIN: FERRITIN: 194 ng/mL (ref 24–336)

## 2015-07-09 LAB — BASIC METABOLIC PANEL
Anion gap: 3 — ABNORMAL LOW (ref 5–15)
BUN: 27 mg/dL — AB (ref 6–20)
CALCIUM: 8.8 mg/dL — AB (ref 8.9–10.3)
CO2: 24 mmol/L (ref 22–32)
Chloride: 107 mmol/L (ref 101–111)
Creatinine, Ser: 1.23 mg/dL (ref 0.61–1.24)
GFR calc Af Amer: >60 mL/min (ref >60)
GFR, EST NON AFRICAN AMERICAN: 58 mL/min — AB (ref >60)
Glucose, Bld: 181 mg/dL — ABNORMAL HIGH (ref 65–99)
POTASSIUM: 3.8 mmol/L (ref 3.5–5.1)
SODIUM: 134 mmol/L — AB (ref 135–145)

## 2015-07-09 LAB — POCT I-STAT TROPONIN I: TROPONIN I, POC: 0 ng/mL (ref 0.00–0.08)

## 2015-07-09 LAB — TSH: TSH: 0.201 u[IU]/mL — AB (ref 0.350–4.500)

## 2015-07-09 LAB — VITAMIN B12: Vitamin B-12: 249 pg/mL (ref 180–914)

## 2015-07-09 LAB — AMMONIA: AMMONIA: 31 umol/L (ref 9–35)

## 2015-07-09 MED ORDER — ONDANSETRON HCL 4 MG PO TABS: 4.0000 mg | ORAL_TABLET | Freq: Four times a day (QID) | ORAL | Status: DC | PRN

## 2015-07-09 MED ORDER — ACETAMINOPHEN 650 MG RE SUPP: 650.0000 mg | Freq: Four times a day (QID) | RECTAL | Status: DC | PRN

## 2015-07-09 MED ORDER — POLYETHYLENE GLYCOL 3350 17 G PO PACK: 17.0000 g | PACK | Freq: Every day | ORAL | Status: DC | PRN

## 2015-07-09 MED ORDER — ONDANSETRON HCL 4 MG/2ML IJ SOLN: 4.0000 mg | Freq: Four times a day (QID) | INTRAMUSCULAR | Status: DC | PRN

## 2015-07-09 MED ORDER — SODIUM CHLORIDE 0.9% FLUSH
3.0000 mL | Freq: Two times a day (BID) | INTRAVENOUS | Status: DC
  Administered 2015-07-09 (×2): 3 mL via INTRAVENOUS

## 2015-07-09 MED ORDER — ATORVASTATIN CALCIUM 40 MG PO TABS
40.0000 mg | ORAL_TABLET | Freq: Every day | ORAL | Status: DC
  Administered 2015-07-10: 40 mg via ORAL
  Filled 2015-07-09: qty 1

## 2015-07-09 MED ORDER — BISACODYL 5 MG PO TBEC: 5.0000 mg | DELAYED_RELEASE_TABLET | Freq: Every day | ORAL | Status: DC | PRN

## 2015-07-09 MED ORDER — TAMSULOSIN HCL 0.4 MG PO CAPS
0.4000 mg | ORAL_CAPSULE | Freq: Every day | ORAL | Status: DC
  Administered 2015-07-09: 0.4 mg via ORAL
  Filled 2015-07-09: qty 1

## 2015-07-09 MED ORDER — HEPARIN SODIUM (PORCINE) 5000 UNIT/ML IJ SOLN: 5000.0000 [IU] | Freq: Three times a day (TID) | INTRAMUSCULAR | Status: DC

## 2015-07-09 MED ORDER — DEXTROSE-NACL 5-0.45 % IV SOLN
INTRAVENOUS | Status: AC
  Administered 2015-07-09: 01:00:00 via INTRAVENOUS

## 2015-07-09 MED ORDER — ASPIRIN EC 81 MG PO TBEC: 81.0000 mg | DELAYED_RELEASE_TABLET | Freq: Every day | ORAL | Status: DC

## 2015-07-09 MED ORDER — LORAZEPAM 2 MG/ML IJ SOLN: 2.0000 mg | INTRAMUSCULAR | Status: DC | PRN

## 2015-07-09 MED ORDER — ACETAMINOPHEN 325 MG PO TABS: 650.0000 mg | ORAL_TABLET | Freq: Four times a day (QID) | ORAL | Status: DC | PRN

## 2015-07-09 MED ORDER — GABAPENTIN 300 MG PO CAPS
600.0000 mg | ORAL_CAPSULE | Freq: Two times a day (BID) | ORAL | Status: DC
  Administered 2015-07-09 – 2015-07-10 (×2): 600 mg via ORAL
  Filled 2015-07-09 (×2): qty 2

## 2015-07-09 NOTE — Progress Notes (Signed)
CODE STROKE 2049 beeper 2050 call 2101 exam began 2105 exam finished 2107  Images sent to soc,exam completed in epic and called [place] radiology spoke with jodi/bbj

## 2015-07-09 NOTE — Progress Notes (Signed)
Patient ID: Allen Yu, male   DOB: [birth year], 70 y.o.   MRN: [id number]  PROGRESS NOTE                                                                                                                                                                                                             Patient Demographics:    Allen Yu, is a 70 y.o. male, DOB - 19-May-1945, [id number]  Admit date - 07/08/2015   Admitting Physician Allen Bulls, MD  Outpatient Primary MD for the patient is Allen Noble, MD  LOS - 1d  Outpatient Specialists: ?  Chief Complaint  Patient presents with  . Altered Mental Status       Brief Narrative  70 y.o. male with medical history significant for insulin-dependent diabetes mellitus, chronic kidney disease stage II, hypertension, and tobacco abuse who presents to the ED after being found poorly responsive by his wife. Patient had reportedly been in his usual state throughout the day of his presentation until approximately 7:30 PM when his wife found him to be sleeping in a chair on the porch and was unable to wake him. She had seen him approximately 10 minutes earlier in his usual state. She reports that he was diaphoretic and drooling when she found him unresponsive. She reports that he has had similar episodes previously which were attributed to hypoglycemia and so she tried giving him a Popsicle. She was able to wet his mouth with a popsicle and then fed him tablespoon of sugar. Patient's wife reports that there seemed to be some slight improvement in his responsiveness after the sugar was administered and EMS was called. Upon the arrival of EMS, patient reportedly had CBG in the low 100s with stable vitals. He was brought into the emergency department for further evaluation. There had been no seizure-like activity reported, and no known trauma.  ED Course: Upon arrival to the ED, patient is found to be afebrile, saturating well on room air, and with vital signs  stable. Chemistry panel features a serum creatinine 1.85, up from an apparent baseline of approximately 1.1. Glucose is noted to be 90. CBC is notable for a hemoglobin of 10.4 which appears stable relative to prior measurements. Ethanol level is < 5 and UDS is positive for THC. INR is in the normal range and urinalysis is unremarkable. While still in the ED, a repeat CBG was 56 and 1 ampule of D50 was administered. Noncontrast head CT was obtained and notable for a tiny focus in the left anterior  frontal lobe which is thought to possibly represent an acute intraparenchymal hemorrhage. Code stroke was activated and teleneurologist was consulted. The tiny abnormality on the head CT was not felt to explain the patient's symptoms, which were felt to be more likely due to a hypoglycemic event with associated seizure and postictal state. CTA head and neck was advised for further evaluation. 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 Other noncritical stenoses were also noted. Incidental notation is made of a spiculated lung nodule in the right upper lobe which warrants follow-up imaging in 3 months time. Neurology hospitalist was consulted by the EDP and advised admitting the patient to [facility] and obtaining MRI in the morning. Patient has remained hemodynamically stable in the ED and will be observed in the hospital for ongoing evaluation and management of acute encephalopathy.   Subjective:    Allen Yu today has apparently returned to baseline  He family thinks that his altered mental status was due to hypoglycemia,   No headache, No chest pain, No abdominal pain - No Nausea, No new weakness tingling or numbness,     Assessment  & Plan :    Principal Problem:   Altered mental status Active Problems:   CKD (chronic kidney disease), stage II   Hypertension   Insulin dependent  diabetes mellitus ([place])   AKI (acute kidney injury) ([place])   Normocytic anemia   Tobacco abuse   Lung nodule seen on imaging study   Acute encephalopathy   Hypoglycemia due to insulin   1.AMS secondary to ? Hypoglycemia Monitor bs.   2. Subacute hemorrhage L frontal and medial lobe D/w Dr. Gifford Yu neurology who recommended observation He did not believe that further imaging needed.  Recommended continuation of aspirin F/u with neurology as outpatient  3. 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  Pulmonary spiculated nodule 1cm Pt will need outpatient pulmonary consultation Pt will needs repeat CT scan or PET scan in 47months   5. Dm2 Cont current medications  Code Status : FULL CODE  Family Communication  : w wife  Disposition Plan  : home  Barriers For Discharge  :   none  Consults  :   [facility]ide neurology Dr. Gifford Yu  Procedures  :    DVT Prophylaxis  :  SCD  Lab Results  Component Value Date   PLT 204 07/08/2015    Antibiotics  :  none  Anti-infectives    None        Objective:   Filed Vitals:   07/09/15 0001 07/09/15 0159 07/09/15 0359 07/09/15 0559  BP: 124/77 116/65 112/64 107/72  Pulse: 75 67 65 74  Temp: 98.2 F (36.8 C) 98.1 F (36.7 C) 97.2 F (36.2 C) 97.5 F (36.4 C)  TempSrc: Oral Oral Oral Oral  Resp: 15 16 16 16   Height: 6\' 1"  (1.854 m)     Weight: 68.5 kg (151 lb 0.2 oz)     SpO2: 100% 100% 99% 99%    Wt Readings from Last 3 Encounters:  07/09/15 68.5 kg (151 lb 0.2 oz)  12/07/14 78.926 kg (174 lb)  02/18/14 74.844 kg (165 lb)     Intake/Output Summary (Last 24 hours) at 07/09/15 0821 Last data filed at 07/09/15 0600  Gross per 24 hour  Intake 498.33 ml  Output    300  ml  Net 198.33 ml     Physical Exam  Awake Alert, Oriented X 3, No new F.N deficits, Normal affect [place].AT,PERRAL Supple Neck,No JVD, No cervical lymphadenopathy appriciated.  Symmetrical Chest  wall movement, Good air movement bilaterally, CTAB RRR,No Gallops,Rubs or new Murmurs, No Parasternal Heave +ve B.Sounds, Abd Soft, No tenderness, No organomegaly appriciated, No rebound - guarding or rigidity. No Cyanosis, Clubbing or edema, No new Rash or bruise     Data Review:    CBC  Recent Labs Lab 07/08/15 2136 07/08/15 2155  WBC  --  6.5  HGB 10.2* 10.4*  HCT 30.0* 29.3*  PLT  --  204  MCV  --  90.2  MCH  --  32.0  MCHC  --  35.5  RDW  --  13.2  LYMPHSABS  --  1.9  MONOABS  --  0.4  EOSABS  --  0.2  BASOSABS  --  0.0    Chemistries   Recent Labs Lab 07/08/15 2130 07/08/15 2136  NA 136 139  K 3.6 4.8  CL 108 104  CO2 24  --   GLUCOSE 90 84  BUN 36* 46*  CREATININE 1.85* 1.90*  CALCIUM 8.8*  --   AST 19  --   ALT 17  --   ALKPHOS 95  --   BILITOT 0.9  --    ------------------------------------------------------------------------------------------------------------------ No results for input(s): CHOL, HDL, LDLCALC, TRIG, CHOLHDL, LDLDIRECT in the last 72 hours.  Lab Results  Component Value Date   HGBA1C 6.3* 11/17/2012   ------------------------------------------------------------------------------------------------------------------ No results for input(s): TSH, T4TOTAL, T3FREE, THYROIDAB in the last 72 hours.  Invalid input(s): FREET3 ------------------------------------------------------------------------------------------------------------------ No results for input(s): VITAMINB12, FOLATE, FERRITIN, TIBC, IRON, RETICCTPCT in the last 72 hours.  Coagulation profile  Recent Labs Lab 07/08/15 2155  INR 1.11    No results for input(s): DDIMER in the last 72 hours.  Cardiac Enzymes No results for input(s): CKMB, TROPONINI, MYOGLOBIN in the last 168 hours.  Invalid input(s): CK ------------------------------------------------------------------------------------------------------------------ No results found for: BNP  Inpatient  Medications  Scheduled Meds: . atorvastatin  40 mg Oral Daily  . gabapentin  600 mg Oral BID  . sodium chloride flush  3 mL Intravenous Q12H  . tamsulosin  0.4 mg Oral QPC supper   Continuous Infusions: . dextrose 5 % and 0.45% NaCl 100 mL/hr at 07/09/15 0101   PRN Meds:.acetaminophen **OR** acetaminophen, bisacodyl, LORazepam, ondansetron **OR** ondansetron (ZOFRAN) IV, polyethylene glycol  Micro Results No results found for this or any previous visit (from the past 240 hour(s)).  Radiology Reports Ct Angio Head W/cm &/or Wo Cm  07/09/2015  EXAM: CT ANGIOGRAPHY HEAD AND NECK TECHNIQUE: Multidetector CT imaging of the head and neck was performed using the standard protocol during bolus administration of intravenous contrast. Multiplanar CT image reconstructions and MIPs were obtained to evaluate the vascular anatomy. Carotid stenosis measurements (when applicable) are obtained utilizing NASCET criteria, using the distal internal carotid diameter as the denominator. CONTRAST:  100 cc of Isovue 370. COMPARISON:  Prior CT from earlier same day. FINDINGS: CTA NECK Aortic arch: Valuation of the aortic arch limited due to timing of the contrast bolus. Similarly, origin of the great vessels not well evaluated. Scattered atheromatous plaque within the arch itself. Subclavian arteries not well evaluated on this exam due to timing the contrast bolus. Right carotid system: Origin of the right common carotid artery not well seen due to streak artifact from majority of the contrast in the venous system.  Right common carotid artery grossly patent distally to the bifurcation without definite stenosis or acute abnormality, although evaluation limited by motion and contrast bolus timing. Mild scattered plaque about the right bifurcation/ proximal right ICA. Associated short-segment stenosis of approximately 75% by NASCET criteria. Distally, right ICA patent to the skullbase. Atheromatous plaque within the distal  right ICA with mild to moderate multi focal narrowing. Left carotid system: Left common carotid artery not well evaluated proximally due to timing of the contrast bolus. Left common carotid artery grossly patent to the bifurcation. Scattered calcified plaque about the left bifurcation/proximal left ICA without flow limiting stenosis. Left ICA patent to the skullbase. Scattered eccentric plaque within the distal left ICA without significant narrowing. Vertebral arteries:Origin of the vertebral arteries from the subclavian arteries. Proximally, vertebral arteries not well evaluated due to contrast bolus timing. Vertebral arteries grossly patent to the skullbase without obvious stenosis or other acute abnormality. Skeleton: Patient is status post ACDF at C4 through C7. No worrisome lytic or blastic osseous lesions. Other neck: Emphysematous changes noted within the visualized lungs. Approximately 1 cm spiculated nodule within the right upper lobe (series 6, image 23). Visualized lungs are otherwise clear. Visualized superior mediastinum within normal limits. Thyroid grossly unremarkable. No definite adenopathy within the neck. No acute soft tissue abnormality. CTA HEAD Anterior circulation: Multi focal atheromatous irregularity throughout the horizontal petrous right ICA, which is severely attenuated. This is not well evaluated on this motion degraded study. Extensive atheromatous plaque throughout the cavernous right ICA with moderate to severe multi focal narrowing. Supraclinoid segment is widely patent. Petrous left ICA widely patent. Extensive calcified plaque throughout the cavernous left ICA with moderate multi focal narrowing. Supraclinoid ICA patent. Left A1 segment dominant and patent. Right A1 segment hypoplastic and/ or absent. Anterior communicating artery and anterior cerebral arteries opacified. Left M1 segment widely patent without stenosis or occlusion. Left MCA bifurcation normal. No proximal M2 branch  stenosis or occlusion. Distal left MCA branches opacified with atheromatous irregularity. Right M1 segment patent without stenosis or occlusion. Right MCA bifurcation within normal limits. No proximal M2 branch occlusion. Distal right MCA branches opacified with multi focal atheromatous irregularity. Posterior circulation: Vertebral arteries patent to the vertebrobasilar junction. Left vertebral artery dominant. Posterior inferior cerebral arteries patent proximally. Basilar artery patent to its distal aspect. Superior cerebral arteries patent bilaterally. Both of the posterior cerebral arteries arise knee basilar artery and are well opacified to their distal aspects. Venous sinuses: Grossly patent without venous sinus thrombosis. Anatomic variants: Hypoplastic/absent right A1 segment. No aneurysm or vascular malformation. Delayed phase: No pathologic enhancement. Possible small focus of hemorrhage within the parasagittal anterior left frontal lobe again noted. IMPRESSION: CTA NECK IMPRESSION: 1. Limited study by motion artifact and timing of the contrast bolus. 2. Atheromatous plaque at the proximal right ICA with short-segment stenosis of approximately 75% by NASCET criteria. 3. Additional atheromatous plaque at the distal right ICA with moderate multi focal narrowing (up to approximately 50%). 4. Atheromatous plaque about the left carotid bifurcation without high-grade flow-limiting stenosis. Additional atheromatous plaque within the distal left ICA without significant stenosis. 5. Grossly patent vertebral arteries within the neck. 6. Emphysema. 7. 1 cm spiculated nodular opacity within the right upper lobe, indeterminate. Consider one of the following in 3 months for both low-risk and high-risk individuals: (a) repeat chest CT, (b) follow-up PET-CT, or (c) tissue sampling. This recommendation follows the consensus statement: Guidelines for Management of Incidental Pulmonary Nodules Detected on CT Images:From the  Fleischner Society  2017; published online before print (10.1148/radiol.SG:5268862). CTA HEAD IMPRESSION: 1. Negative for emergent large vessel occlusion. 2. 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 Flow within the horizontal petrous right ICA in particular is severely attenuated, and possibly nearly occluded. 3. Atheromatous plaque throughout the cavernous left ICA with moderate multifocal stenosis. Anterior cerebral artery supplied via the left internal carotid artery system as the right A1 segment is hypoplastic/absent. 4. Patent vertebrobasilar system without high-grade flow-limiting stenosis. Electronically Signed   By: Jeannine Boga M.D.   On: 07/09/2015 00:28   Ct Head Wo Contrast  07/08/2015  CLINICAL DATA:  Nonverbal in confusion with onset 45 minutes ago. History of diabetes and chronic kidney disease. Code stroke. EXAM: CT HEAD WITHOUT CONTRAST TECHNIQUE: Contiguous axial images were obtained from the base of the skull through the vertex without intravenous contrast. COMPARISON:  11/09/2014 FINDINGS: Diffuse cerebral atrophy. No ventricular dilatation. Low-attenuation changes in the deep white matter consistent with small vessel ischemia. There is a tiny focus of increased density in the left anterior frontal lobe white matter. This was not present previously and may represent a tiny focal area of acute intraparenchymal hemorrhage. No abnormal extra-axial fluid collections. No mass effect or midline shift. Gray-white matter junctions are distinct. Basal cisterns are not effaced. Basal ganglia calcifications. Calvarium appears intact. Mild medial right orbital wall deformity possibly representing old fracture deformity. Mastoid air cells and paranasal sinuses are not opacified. Vascular calcifications. Hearing aids bilaterally. IMPRESSION: Possible tiny focus of acute intraparenchymal hemorrhage in the left anterior  frontal lobe. Otherwise, no acute changes are demonstrated. Chronic atrophy and small vessel ischemic changes. These results were called by telephone at the time of interpretation on 07/08/2015 at 9:12 pm to Dr. Ezequiel Essex , who verbally acknowledged these results. Electronically Signed   By: Lucienne Capers M.D.   On: 07/08/2015 21:17   Ct Angio Neck W/cm &/or Wo/cm  07/09/2015  EXAM: CT ANGIOGRAPHY HEAD AND NECK TECHNIQUE: Multidetector CT imaging of the head and neck was performed using the standard protocol during bolus administration of intravenous contrast. Multiplanar CT image reconstructions and MIPs were obtained to evaluate the vascular anatomy. Carotid stenosis measurements (when applicable) are obtained utilizing NASCET criteria, using the distal internal carotid diameter as the denominator. CONTRAST:  100 cc of Isovue 370. COMPARISON:  Prior CT from earlier same day. FINDINGS: CTA NECK Aortic arch: Valuation of the aortic arch limited due to timing of the contrast bolus. Similarly, origin of the great vessels not well evaluated. Scattered atheromatous plaque within the arch itself. Subclavian arteries not well evaluated on this exam due to timing the contrast bolus. Right carotid system: Origin of the right common carotid artery not well seen due to streak artifact from majority of the contrast in the venous system. Right common carotid artery grossly patent distally to the bifurcation without definite stenosis or acute abnormality, although evaluation limited by motion and contrast bolus timing. Mild scattered plaque about the right bifurcation/ proximal right ICA. Associated short-segment stenosis of approximately 75% by NASCET criteria. Distally, right ICA patent to the skullbase. Atheromatous plaque within the distal right ICA with mild to moderate multi focal narrowing. Left carotid system: Left common carotid artery not well evaluated proximally due to timing of the contrast bolus. Left  common carotid artery grossly patent to the bifurcation. Scattered calcified plaque about the left bifurcation/proximal left ICA without flow limiting stenosis. Left ICA patent to the skullbase. Scattered  eccentric plaque within the distal left ICA without significant narrowing. Vertebral arteries:Origin of the vertebral arteries from the subclavian arteries. Proximally, vertebral arteries not well evaluated due to contrast bolus timing. Vertebral arteries grossly patent to the skullbase without obvious stenosis or other acute abnormality. Skeleton: Patient is status post ACDF at C4 through C7. No worrisome lytic or blastic osseous lesions. Other neck: Emphysematous changes noted within the visualized lungs. Approximately 1 cm spiculated nodule within the right upper lobe (series 6, image 23). Visualized lungs are otherwise clear. Visualized superior mediastinum within normal limits. Thyroid grossly unremarkable. No definite adenopathy within the neck. No acute soft tissue abnormality. CTA HEAD Anterior circulation: Multi focal atheromatous irregularity throughout the horizontal petrous right ICA, which is severely attenuated. This is not well evaluated on this motion degraded study. Extensive atheromatous plaque throughout the cavernous right ICA with moderate to severe multi focal narrowing. Supraclinoid segment is widely patent. Petrous left ICA widely patent. Extensive calcified plaque throughout the cavernous left ICA with moderate multi focal narrowing. Supraclinoid ICA patent. Left A1 segment dominant and patent. Right A1 segment hypoplastic and/ or absent. Anterior communicating artery and anterior cerebral arteries opacified. Left M1 segment widely patent without stenosis or occlusion. Left MCA bifurcation normal. No proximal M2 branch stenosis or occlusion. Distal left MCA branches opacified with atheromatous irregularity. Right M1 segment patent without stenosis or occlusion. Right MCA bifurcation within  normal limits. No proximal M2 branch occlusion. Distal right MCA branches opacified with multi focal atheromatous irregularity. Posterior circulation: Vertebral arteries patent to the vertebrobasilar junction. Left vertebral artery dominant. Posterior inferior cerebral arteries patent proximally. Basilar artery patent to its distal aspect. Superior cerebral arteries patent bilaterally. Both of the posterior cerebral arteries arise knee basilar artery and are well opacified to their distal aspects. Venous sinuses: Grossly patent without venous sinus thrombosis. Anatomic variants: Hypoplastic/absent right A1 segment. No aneurysm or vascular malformation. Delayed phase: No pathologic enhancement. Possible small focus of hemorrhage within the parasagittal anterior left frontal lobe again noted. IMPRESSION: CTA NECK IMPRESSION: 1. Limited study by motion artifact and timing of the contrast bolus. 2. Atheromatous plaque at the proximal right ICA with short-segment stenosis of approximately 75% by NASCET criteria. 3. Additional atheromatous plaque at the distal right ICA with moderate multi focal narrowing (up to approximately 50%). 4. Atheromatous plaque about the left carotid bifurcation without high-grade flow-limiting stenosis. Additional atheromatous plaque within the distal left ICA without significant stenosis. 5. Grossly patent vertebral arteries within the neck. 6. Emphysema. 7. 1 cm spiculated nodular opacity within the right upper lobe, indeterminate. Consider one of the following in 3 months for both low-risk and high-risk individuals: (a) repeat chest CT, (b) follow-up PET-CT, or (c) tissue sampling. This recommendation follows the consensus statement: Guidelines for Management of Incidental Pulmonary Nodules Detected on CT Images:From the Fleischner Society 2017; published online before print (10.1148/radiol.IJ:2314499). CTA HEAD IMPRESSION: 1. Negative for emergent large vessel occlusion. 2. Severe  atheromatous disease throughout the petrous and cavernous right ICA with moderate to severe multi focal narrowing. Flow within the horizontal petrous right ICA in particular is severely attenuated, and possibly nearly occluded. 3. Atheromatous plaque throughout the cavernous left ICA with moderate multifocal stenosis. Anterior cerebral artery supplied via the left internal carotid artery system as the right A1 segment is hypoplastic/absent. 4. Patent vertebrobasilar system without high-grade flow-limiting stenosis. Electronically Signed   By: Jeannine Boga M.D.   On: 07/09/2015 00:28    Time Spent in minutes  30  Jani Gravel M.D on 07/09/2015 at 8:21 AM [phone]

## 2015-07-09 NOTE — Evaluation (Signed)
Clinical/Bedside Swallow Evaluation Patient Details  Name: Allen Yu MRN: [id number] Date of Birth: Jan 31, 1945  Today's Date: 07/09/2015 Time: SLP Start Time (ACUTE ONLY): [id number] SLP Stop Time (ACUTE ONLY): 1741 SLP Time Calculation (min) (ACUTE ONLY): 21 min  Past Medical History:  Past Medical History  Diagnosis Date  . Diabetes mellitus   . Chronic kidney disease     stones hx  . PTSD (post-traumatic stress disorder)   . GERD (gastroesophageal reflux disease)   . Chronic back pain   . Schizophrenia ([place])   . Hypertension   . Tuberculosis     MED TX X 1 YEAR YEARS AGO   . Arthritis   . Peripheral neuropathy [facility])    Past Surgical History:  Past Surgical History  Procedure Laterality Date  . Hernia repair  rt ing  . Bladder surgery      02  . Back surgery      x2  . Appendectomy    . Cholecystectomy    . Shoulder surgery      RIGHT SHOULDER   . Inguinal hernia repair Left 11/03/2013    Procedure: HERNIA REPAIR INGUINAL ADULT;  Surgeon: Gayland Curry, MD;  Location: [place];  Service: General;  Laterality: Left;  . Colonoscopy    . Colonoscopy N/A 12/07/2014    Procedure: COLONOSCOPY;  Surgeon: Daneil Dolin, MD;  [facility];  Service: Endoscopy;  Laterality: N/A;  10:30 Am   HPI:  Allen Yu is a 70 y.o. male with medical history significant for insulin-dependent diabetes mellitus, chronic kidney disease stage II, hypertension, and tobacco abuse who presents to the ED after being found poorly responsive by his wife. Patient had reportedly been in his usual state throughout the day of his presentation until approximately 7:30 PM when his wife found him to be sleeping in a chair on the porch and was unable to wake him. She had seen him approximately 10 minutes earlier in his usual state. She reports that he was diaphoretic and drooling when she found him unresponsive. She reports that he has had similar episodes previously which were attributed to  hypoglycemia and so she tried giving him a Popsicle. She was able to wet his mouth with a popsicle and then fed him tablespoon of sugar. Patient's wife reports that there seemed to be some slight improvement in his responsiveness after the sugar was administered and EMS was called. Upon the arrival of EMS, patient reportedly had CBG in the low 100s with stable vitals. He was brought into the emergency department for further evaluation. There had been no seizure-like activity reported, and no known trauma.   Assessment / Plan / Recommendation Clinical Impression  Pt was found very frustrated upon SLP entering the room although his affect improved as eval progressed and as SLP provided education of aspiration risk and how nursing and SLP have protocols in place for patient safety. Pt consumed ice chips and thin liquids with no overt s/sx of aspiration although he did consistently demonstrate facial grimacing with what seemed to be an effortful swallow and demonstrated an involuntary chin tuck seemingly to facilitate swallowing. However, as thin trials continued he demonstrated less facial grimacing with swallowing (no wet vocal quality or coughing/throat clears); he reported that his throat "hurt". SLP questions if effortful swallow and facial grimacing is more related to sore throat rather than new onset of deglutition difficulty. Pt demonstrated no overt s/sx of aspiration with regular textures trialed. Pt further reports  he "hasn't eaten in three days" because his "stomach hurts". This was reported to nursing, patient and family (present for evaluation); Pt and family were educated of the importance of regular and consistent intake for diabetes management and in order to decrease nutritional risk wholistically. Recommend regular/thin liquids. There are no further ST needs noted at this time, ST to sign off.    Aspiration Risk  Mild aspiration risk    Diet Recommendation Regular;Thin liquid   Liquid  Administration via: Cup;Straw Medication Administration: Whole meds with liquid Supervision: Patient able to self feed Compensations: Slow rate;Small sips/bites;Clear throat intermittently Postural Changes: Seated upright at 90 degrees    Other  Recommendations Oral Care Recommendations: Oral care BID   Follow up Recommendations  None      Swallow Study   General Date of Onset: 07/08/15 HPI: Allen Yu is a 70 y.o. male with medical history significant for insulin-dependent diabetes mellitus, chronic kidney disease stage II, hypertension, and tobacco abuse who presents to the ED after being found poorly responsive by his wife. Patient had reportedly been in his usual state throughout the day of his presentation until approximately 7:30 PM when his wife found him to be sleeping in a chair on the porch and was unable to wake him. She had seen him approximately 10 minutes earlier in his usual state. She reports that he was diaphoretic and drooling when she found him unresponsive. She reports that he has had similar episodes previously which were attributed to hypoglycemia and so she tried giving him a Popsicle. She was able to wet his mouth with a popsicle and then fed him tablespoon of sugar. Patient's wife reports that there seemed to be some slight improvement in his responsiveness after the sugar was administered and EMS was called. Upon the arrival of EMS, patient reportedly had CBG in the low 100s with stable vitals. He was brought into the emergency department for further evaluation. There had been no seizure-like activity reported, and no known trauma. Type of Study: Bedside Swallow Evaluation Previous Swallow Assessment: none Diet Prior to this Study: NPO Temperature Spikes Noted: No Respiratory Status: Room air History of Recent Intubation: No Behavior/Cognition: Alert Oral Cavity Assessment: Dry Oral Cavity - Dentition: Adequate natural dentition Vision: Functional for  self-feeding Self-Feeding Abilities: Able to feed self Patient Positioning: Upright in bed Baseline Vocal Quality: Normal Volitional Cough: Strong Volitional Swallow: Able to elicit    Oral/Motor/Sensory Function Overall Oral Motor/Sensory Function: Within functional limits   Ice Chips Ice chips: Within functional limits (facial grimacing)   Thin Liquid Thin Liquid: Within functional limits (facial grimacing)             Solid    Allen Yu Mc, CCC-SLP Speech Language Pathologist   Solid: Within functional limits        Wende Bushy 07/09/2015,6:01 PM

## 2015-07-09 NOTE — ED Notes (Addendum)
Admitting MD at bedside.

## 2015-07-09 NOTE — ED Notes (Signed)
Admitting MD notified of low CBG episode.

## 2015-07-09 NOTE — H&P (Signed)
History and Physical    Allen Yu [id number] DOB: [birth year] DOA: 07/08/2015  PCP: [name], MD   Patient coming from: Home   Chief Complaint: Poorly responsive   HPI: Allen Yu is a 70 y.o. male with medical history significant for insulin-dependent diabetes mellitus, chronic kidney disease stage II, hypertension, and tobacco abuse who presents to the ED after being found poorly responsive by his wife. Patient had reportedly been in his usual state throughout the day of his presentation until approximately 7:30 PM when his wife found him to be sleeping in a chair on the porch and was unable to wake him. She had seen him approximately 10 minutes earlier in his usual state. She reports that he was diaphoretic and drooling when she found him unresponsive. She reports that he has had similar episodes previously which were attributed to hypoglycemia and so she tried giving him a Popsicle. She was able to wet his mouth with a popsicle and then fed him tablespoon of sugar. Patient's wife reports that there seemed to be some slight improvement in his responsiveness after the sugar was administered and EMS was called. Upon the arrival of EMS, patient reportedly had CBG in the low 100s with stable vitals. He was brought into the emergency department for further evaluation. There had been no seizure-like activity reported, and no known trauma.  ED Course: Upon arrival to the ED, patient is found to be afebrile, saturating well on room air, and with vital signs stable. Chemistry panel features a serum creatinine 1.85, up from an apparent baseline of approximately 1.1. Glucose is noted to be 90. CBC is notable for a hemoglobin of 10.4 which appears stable relative to prior measurements. Ethanol level is < 5 and UDS is positive for THC. INR is in the normal range and urinalysis is unremarkable. While still in the ED, a repeat CBG was 56 and 1 ampule of D50 was administered. Noncontrast head CT was  obtained and notable for a tiny focus in the left anterior frontal lobe which is thought to possibly represent an acute intraparenchymal hemorrhage. Code stroke was activated and teleneurologist was consulted. The tiny abnormality on the head CT was not felt to explain the patient's symptoms, which were felt to be more likely due to a hypoglycemic event with associated seizure and postictal state. CTA head and neck was advised for further evaluation. 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 Other noncritical stenoses were also noted. Incidental notation is made of a spiculated lung nodule in the right upper lobe which warrants follow-up imaging in 3 months time. Neurology hospitalist was consulted by the EDP and advised admitting the patient to [facility] and obtaining MRI in the morning. Patient has remained hemodynamically stable in the ED and will be observed in the hospital for ongoing evaluation and management of acute encephalopathy.  Review of Systems:  Unable to obtain ROS secondary to the patient's clinical condition with acute encephalopathy.  Past Medical History  Diagnosis Date  . Diabetes mellitus   . Chronic kidney disease     stones hx  . PTSD (post-traumatic stress disorder)   . GERD (gastroesophageal reflux disease)   . Chronic back pain   . Schizophrenia ([name])   . Hypertension   . Tuberculosis     MED TX X 1 YEAR YEARS AGO   . Arthritis   . Peripheral neuropathy ([place])  Past Surgical History  Procedure Laterality Date  . Hernia repair  rt ing  . Bladder surgery      02  . Back surgery      x2  . Appendectomy    . Cholecystectomy    . Shoulder surgery      RIGHT SHOULDER   . Inguinal hernia repair Left 11/03/2013    Procedure: HERNIA REPAIR INGUINAL ADULT;  Surgeon: Gayland Curry, MD;  Location: [place];  Service: General;  Laterality:  Left;  . Colonoscopy    . Colonoscopy N/A 12/07/2014    Procedure: COLONOSCOPY;  Surgeon: Daneil Dolin, MD;  [facility];  Service: Endoscopy;  Laterality: N/A;  10:30 Am     reports that he has been smoking Cigarettes.  He has been smoking about 1.00 pack per day. He does not have any smokeless tobacco history on file. He reports that he uses illicit drugs (Marijuana). He reports that he does not drink alcohol.  Allergies  Allergen Reactions  . Bee Venom Other (See Comments)    Unknown   . Codeine Other (See Comments)    incoherent   . Diovan [Valsartan] Other (See Comments)    incoherent    History reviewed. No pertinent family history.   Prior to Admission medications   Medication Sig Start Date End Date Taking? Authorizing Provider  aspirin EC 81 MG tablet Take 81 mg by mouth at bedtime.     Historical Provider, MD  atorvastatin (LIPITOR) 40 MG tablet Take 40 mg by mouth daily. 06/10/15   Historical Provider, MD  gabapentin (NEURONTIN) 600 MG tablet Take 600 mg by mouth 2 (two) times daily. 06/10/15   Historical Provider, MD  ibuprofen (ADVIL,MOTRIN) 200 MG tablet Take 400 mg by mouth every 6 (six) hours as needed (pain).    Historical Provider, MD  insulin lispro (HUMALOG) 100 UNIT/ML injection Inject 10 Units into the skin 3 (three) times daily before meals.    Historical Provider, MD  insulin NPH-regular Human (NOVOLIN 70/30) (70-30) 100 UNIT/ML injection Inject 70 Units into the skin at bedtime.    Historical Provider, MD  losartan (COZAAR) 50 MG tablet Take 50 mg by mouth at bedtime.     Historical Provider, MD  polyethylene glycol-electrolytes (TRILYTE) 420 G solution Take 4,000 mLs by mouth as directed. 11/15/14   Daneil Dolin, MD  Tamsulosin HCl (FLOMAX) 0.4 MG CAPS Take 0.4 mg by mouth daily after supper.     Historical Provider, MD    Physical Exam: Filed Vitals:   07/08/15 2230 07/08/15 2300 07/08/15 2330 07/09/15 0001  BP: 112/66 125/67 124/74 124/77   Pulse: 64 80 107 75  Temp:      TempSrc:      Resp: 16 18 19 15   SpO2: 100% 95% 99% 100%      Constitutional: NAD, calm, obtunded Eyes: PERTLA, lids and conjunctivae normal ENMT: Mucous membranes are moist. Posterior pharynx clear of any exudate or lesions.   Neck: normal, supple, no masses, no thyromegaly Respiratory: clear to auscultation bilaterally, no wheezing, no crackles. Normal respiratory effort.   Cardiovascular: S1 & S2 heard, regular rate and rhythm, no significant murmurs / rubs / gallops. No extremity edema. No significant JVD. Abdomen: No distension, no tenderness, no masses palpated. Bowel sounds normal.  Musculoskeletal: no clubbing / cyanosis. No joint deformity upper and lower extremities. Normal muscle tone.  Skin: no significant rashes, lesions, ulcers. Warm, dry, well-perfused. Neurologic: PERRL, no facial asymmetry, no  verbal response, arouses to pain only, moves all extrems equally, patellar DTR's 2+ and symmetric, Babinski down-going b/l Psychiatric: Difficult to assess given the clinical scenario.     Labs on Admission: I have personally reviewed following labs and imaging studies  CBC:  Recent Labs Lab 07/08/15 2136 07/08/15 2155  WBC  --  6.5  NEUTROABS  --  4.0  HGB 10.2* 10.4*  HCT 30.0* 29.3*  MCV  --  90.2  PLT  --  0000000   Basic Metabolic Panel:  Recent Labs Lab 07/08/15 2130 07/08/15 2136  NA 136 139  K 3.6 4.8  CL 108 104  CO2 24  --   GLUCOSE 90 84  BUN 36* 46*  CREATININE 1.85* 1.90*  CALCIUM 8.8*  --    GFR: CrCl cannot be calculated (Unknown ideal weight.). Liver Function Tests:  Recent Labs Lab 07/08/15 2130  AST 19  ALT 17  ALKPHOS 95  BILITOT 0.9  PROT 6.9  ALBUMIN 4.2   No results for input(s): LIPASE, AMYLASE in the last 168 hours. No results for input(s): AMMONIA in the last 168 hours. Coagulation Profile:  Recent Labs Lab 07/08/15 2155  INR 1.11   Cardiac Enzymes: No results for input(s):  CKTOTAL, CKMB, CKMBINDEX, TROPONINI in the last 168 hours. BNP (last 3 results) No results for input(s): PROBNP in the last 8760 hours. HbA1C: No results for input(s): HGBA1C in the last 72 hours. CBG:  Recent Labs Lab 07/08/15 2050 07/08/15 2143 07/08/15 2216 07/08/15 2334 07/08/15 2352  GLUCAP 108* 108* 90 56* 223*   Lipid Profile: No results for input(s): CHOL, HDL, LDLCALC, TRIG, CHOLHDL, LDLDIRECT in the last 72 hours. Thyroid Function Tests: No results for input(s): TSH, T4TOTAL, FREET4, T3FREE, THYROIDAB in the last 72 hours. Anemia Panel: No results for input(s): VITAMINB12, FOLATE, FERRITIN, TIBC, IRON, RETICCTPCT in the last 72 hours. Urine analysis:    Component Value Date/Time   COLORURINE YELLOW 07/08/2015 2200   APPEARANCEUR CLEAR 07/08/2015 2200   LABSPEC 1.025 07/08/2015 2200   PHURINE 5.0 07/08/2015 2200   GLUCOSEU NEGATIVE 07/08/2015 2200   HGBUR NEGATIVE 07/08/2015 2200   BILIRUBINUR NEGATIVE 07/08/2015 2200   KETONESUR NEGATIVE 07/08/2015 2200   PROTEINUR NEGATIVE 07/08/2015 2200   UROBILINOGEN 0.2 02/18/2014 0950   NITRITE NEGATIVE 07/08/2015 2200   LEUKOCYTESUR NEGATIVE 07/08/2015 2200   Sepsis Labs: @LABRCNTIP (procalcitonin:4,lacticidven:4) )No results found for this or any previous visit (from the past 240 hour(s)).   Radiological Exams on Admission: Ct Head Wo Contrast  07/08/2015  CLINICAL DATA:  Nonverbal in confusion with onset 45 minutes ago. History of diabetes and chronic kidney disease. Code stroke. EXAM: CT HEAD WITHOUT CONTRAST TECHNIQUE: Contiguous axial images were obtained from the base of the skull through the vertex without intravenous contrast. COMPARISON:  11/09/2014 FINDINGS: Diffuse cerebral atrophy. No ventricular dilatation. Low-attenuation changes in the deep white matter consistent with small vessel ischemia. There is a tiny focus of increased density in the left anterior frontal lobe white matter. This was not present  previously and may represent a tiny focal area of acute intraparenchymal hemorrhage. No abnormal extra-axial fluid collections. No mass effect or midline shift. Gray-white matter junctions are distinct. Basal cisterns are not effaced. Basal ganglia calcifications. Calvarium appears intact. Mild medial right orbital wall deformity possibly representing old fracture deformity. Mastoid air cells and paranasal sinuses are not opacified. Vascular calcifications. Hearing aids bilaterally. IMPRESSION: Possible tiny focus of acute intraparenchymal hemorrhage in the left anterior frontal lobe. Otherwise, no  acute changes are demonstrated. Chronic atrophy and small vessel ischemic changes. These results were called by telephone at the time of interpretation on 07/08/2015 at 9:12 pm to Dr. Ezequiel Essex , who verbally acknowledged these results. Electronically Signed   By: Lucienne Capers M.D.   On: 07/08/2015 21:17    EKG: Ordered and pending.   Assessment/Plan  1. Acute encephalopathy  - Etiology remains uncertain at this time, possibly related to hypoglycemia with resulting seizure  - CT head notable for a tiny opacification in anterior left frontal lobe felt to possibly represent a tiny hemorrhage, but felt by neurology to not explain the presentation  - CTA head and neck with no emergent large vessel occlusion, but significant atheromatous plaque-causing stenosis in right ICA; neurology indicates no intervention at this time - NPO until swallow-eval passed  - MRI brain without con ordered; EEG requested  - Check ammonia, TSH, RPR, B12, folate  2. Hypoglycemia with IDDM  - Pt has become hypoglycemic in ED to 56, corrected with 1 amp D50%; placed on D5%-1/2 NS infusion with frequent CBG's  - Manages DM at home with Novolin 70/30, 70 units qHS, and Humalog, 10 units TID  - Holding insulins for now; resume as appropriate  - Check A1c, pending    3. AKI superimposed on CKD stage II  - SCr 1.85 on  admission; baseline not entirely clear, but appears to be ~1.1  - Gentle IVF hydration overnight  - Avoid nephrotoxins, hold losartan  - Repeat chem panel in am   4. Normocytic anemia  - Hgb 10.4 on admission, stable relative to priors  - No sign of active blood-loss; likely secondary to chronic disease  - Check iron studies, B12, folate; supplement prn    5. Hypertension  - BP at goal at time of admission  - Managed with losartan at home; holding currently in light of apparent AKI   6. Tobacco abuse  - Pt unable to engage in conversation at this time - RN asked to provide smoking-cessation information prior to discharge    7. Lung nodule  - On CTA head/neck, there is incidental notation made of a spiculated nodule in RUL  - Follow-up imaging at 3 mos is recommended  - Unable to discuss with pt at this time d/t the clinical situation     DVT prophylaxis: sq heparin Code Status: Full Family Communication: Wife and brother updated at bedside  Disposition Plan: Observe on telemetry   Consults called: Neurology  Admission status: Observation     Vianne Bulls, MD [facility] Pager [phone]  If 7PM-7AM, please contact night-coverage [email or web address] Password [facility]  07/09/2015, 12:25 AM

## 2015-07-09 NOTE — Progress Notes (Signed)
Pt failed stroke swallow screen in ED. SLP called and stated that they wanted RN to perform swallow study at [facility]. RN called MD to verify and MD stated it was okay to perform study at [facility]. RN perform swallow Study with patient and patient failed. SLP called and notified and stated they would be up here.   Explained to patient about NPO status and when SLP would be here. Pt was very frustrated and upset. Pt stated " well they have an hour to get here or im leaving". RN stated that she will try calling again and verify time. Oswald Hillock, RN

## 2015-07-10 ENCOUNTER — Inpatient Hospital Stay (HOSPITAL_COMMUNITY): Payer: Medicare Other

## 2015-07-10 DIAGNOSIS — I6789 Other cerebrovascular disease: Secondary | ICD-10-CM

## 2015-07-10 LAB — COMPREHENSIVE METABOLIC PANEL
ALBUMIN: 3.9 g/dL (ref 3.5–5.0)
ALT: 14 U/L — ABNORMAL LOW (ref 17–63)
ANION GAP: 4 — AB (ref 5–15)
AST: 16 U/L (ref 15–41)
Alkaline Phosphatase: 100 U/L (ref 38–126)
BILIRUBIN TOTAL: 0.6 mg/dL (ref 0.3–1.2)
BUN: 20 mg/dL (ref 6–20)
CO2: 22 mmol/L (ref 22–32)
Calcium: 8.9 mg/dL (ref 8.9–10.3)
Chloride: 111 mmol/L (ref 101–111)
Creatinine, Ser: 1.05 mg/dL (ref 0.61–1.24)
GFR calc Af Amer: >60 mL/min (ref >60)
Glucose, Bld: 168 mg/dL — ABNORMAL HIGH (ref 65–99)
POTASSIUM: 3.9 mmol/L (ref 3.5–5.1)
Sodium: 137 mmol/L (ref 135–145)
TOTAL PROTEIN: 6.7 g/dL (ref 6.5–8.1)

## 2015-07-10 LAB — CBC
HEMATOCRIT: 31.3 % — AB (ref 39.0–52.0)
Hemoglobin: 11 g/dL — ABNORMAL LOW (ref 13.0–17.0)
MCH: 31.9 pg (ref 26.0–34.0)
MCHC: 35.1 g/dL (ref 30.0–36.0)
MCV: 90.7 fL (ref 78.0–100.0)
Platelets: 213 10*3/uL (ref 150–400)
RBC: 3.45 MIL/uL — ABNORMAL LOW (ref 4.22–5.81)
RDW: 13.3 % (ref 11.5–15.5)
WBC: 7.2 10*3/uL (ref 4.0–10.5)

## 2015-07-10 LAB — GLUCOSE, CAPILLARY: Glucose-Capillary: 226 mg/dL — ABNORMAL HIGH (ref 65–99)

## 2015-07-10 NOTE — Discharge Summary (Signed)
Allen Yu, is a 70 y.o. male  DOB Dec 24, 1945  MRN [id number].  Admission date:  07/08/2015  Admitting Physician  Vianne Bulls, MD  Discharge Date:  07/10/2015   Primary MD  [name], MD  Recommendations for primary care physician for things to follow:   1) Subacute hemorrhage L frontal lobe Cardiac echo ordered, pending at this time.  D/w neurology no further imaging needed, unless has clincal neurological deterioration Able to restart on aspirin per neurology.  Dr. 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  Decrease Lantus ? By 5 units daily.     Admission Diagnosis  Altered mental status, unspecified altered mental status type [R41.82] Intraparenchymal hematoma of brain, left, without loss of consciousness, initial encounter ([place]) [S06.350A]   Discharge Diagnosis  Altered mental status, unspecified altered mental status type [R41.82] Intraparenchymal hematoma of brain, left, without loss of consciousness, initial encounter ([place]) [S06.350A]    Principal Problem:   Altered mental status Active Problems:   CKD (chronic kidney disease), stage II   Hypertension   Insulin dependent diabetes mellitus ([place])   AKI (acute kidney injury) ([place])   Normocytic anemia   Tobacco abuse   Lung nodule seen on imaging study   Acute encephalopathy   Hypoglycemia due to insulin      Past Medical History  Diagnosis Date  . Diabetes mellitus   . Chronic kidney disease     stones hx  . PTSD  (post-traumatic stress disorder)   . GERD (gastroesophageal reflux disease)   . Chronic back pain   . Schizophrenia ([place])   . Hypertension   . Tuberculosis     MED TX X 1 YEAR YEARS AGO   . Arthritis   . Peripheral neuropathy ([place])   . Lung nodule   . Carotid stenosis     Past Surgical History  Procedure Laterality Date  . Hernia repair  rt ing  . Bladder surgery      02  . Back surgery      x2  . Appendectomy    . Cholecystectomy    . Shoulder surgery      RIGHT SHOULDER   . Inguinal hernia repair Left 11/03/2013    Procedure: HERNIA REPAIR INGUINAL ADULT;  Surgeon: Gayland Curry, MD;  Location: [facility];  Service: General;  Laterality: Left;  . Colonoscopy    . Colonoscopy N/A 12/07/2014    Procedure: COLONOSCOPY;  Surgeon: Daneil Dolin, MD;  [facility];  Service: Endoscopy;  Laterality: N/A;  10:30 Am       HPI  from the history and physical done on the day of admission:    70 y.o. male with medical history significant for insulin-dependent diabetes mellitus, chronic kidney disease stage II,  hypertension, and tobacco abuse who presents to the ED after being found poorly responsive by his wife. Patient had reportedly been in his usual state throughout the day of his presentation until approximately 7:30 PM when his wife found him to be sleeping in a chair on the porch and was unable to wake him. She had seen him approximately 10 minutes earlier in his usual state. She reports that he was diaphoretic and drooling when she found him unresponsive. She reports that he has had similar episodes previously which were attributed to hypoglycemia and so she tried giving him a Popsicle. She was able to wet his mouth with a popsicle and then fed him tablespoon of sugar. Patient's wife reports that there seemed to be some slight improvement in his responsiveness after the sugar was administered and EMS was called. Upon the arrival of EMS, patient reportedly had CBG in the low 100s  with stable vitals. He was brought into the emergency department for further evaluation. There had been no seizure-like activity reported, and no known trauma.  ED Course: Upon arrival to the ED, patient is found to be afebrile, saturating well on room air, and with vital signs stable. Chemistry panel features a serum creatinine 1.85, up from an apparent baseline of approximately 1.1. Glucose is noted to be 90. CBC is notable for a hemoglobin of 10.4 which appears stable relative to prior measurements. Ethanol level is < 5 and UDS is positive for THC. INR is in the normal range and urinalysis is unremarkable. While still in the ED, a repeat CBG was 56 and 1 ampule of D50 was administered. Noncontrast head CT was obtained and notable for a tiny focus in the left anterior frontal lobe which is thought to possibly represent an acute intraparenchymal hemorrhage. Code stroke was activated and teleneurologist was consulted. The tiny abnormality on the head CT was not felt to explain the patient's symptoms, which were felt to be more likely due to a hypoglycemic event with associated seizure and postictal state. CTA head and neck was advised for further evaluation. 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 Other noncritical stenoses were also noted. Incidental notation is made of a spiculated lung nodule in the right upper lobe which warrants follow-up imaging in 3 months time. Neurology hospitalist was consulted by the EDP and advised admitting the patient to [facility] and obtaining MRI in the morning. Patient has remained hemodynamically stable in the ED and will be observed in the hospital for ongoing evaluation and management of acute encephalopathy.     Hospital Course:     AMS thought to be secondary to hypoglycemia and CVA.  D/w neurology and they recommended that  Pt continue  with aspirin.  Pt should follow up with neurology as outpatient.  Pt was noted to have R ICA stenosis 70% on CTA .  Pt was also noted to have spiculated 1cm right upper lung nodule which will need follow up.  Pt is unclear as to how or what he is taking in terms of his insulin at home.  He will reduce his Lantus which he states he is taking instead of 70/30 by 5 units.  Pt states that he is taking 65 units currently at home.  Pt will keep a blood sugar diary.     Follow UP  Follow-up Information    Follow up with [name], MD In 1  week.   Specialty:  Internal Medicine   Contact information:   [address] [place] [address] [phone]        Consults obtained - neurology per ED  Discharge Condition: stable  Diet and Activity recommendation: See Discharge Instructions below  Discharge Instructions    F/u with Dr. Willey Blade in 1 week to coordinate referals to  Neurology, Pulmonary, Vascular surgery     Discharge Medications       Medication List    STOP taking these medications        ibuprofen 200 MG tablet  Commonly known as:  ADVIL,MOTRIN     polyethylene glycol-electrolytes 420 g solution  Commonly known as:  TRILYTE      TAKE these medications        aspirin EC 81 MG tablet  Take 81 mg by mouth at bedtime.     atorvastatin 40 MG tablet  Commonly known as:  LIPITOR  Take 40 mg by mouth daily.     donepezil 5 MG tablet  Commonly known as:  ARICEPT  Take 5 mg by mouth at bedtime.     gabapentin 600 MG tablet  Commonly known as:  NEURONTIN  Take 600 mg by mouth 2 (two) times daily.     insulin lispro 100 UNIT/ML injection  Commonly known as:  HUMALOG  Inject 10 Units into the skin 3 (three) times daily before meals.     insulin NPH-regular Human (70-30) 100 UNIT/ML injection  Commonly known as:  NOVOLIN 70/30  Inject 70 Units into the skin at bedtime.     losartan 50 MG tablet  Commonly known as:  COZAAR  Take 50 mg by mouth at  bedtime.     sertraline 50 MG tablet  Commonly known as:  ZOLOFT  Take 50 mg by mouth daily.     tamsulosin 0.4 MG Caps capsule  Commonly known as:  FLOMAX  Take 0.4 mg by mouth daily after supper.        Major procedures and Radiology Reports - PLEASE review detailed and final reports for all details, in brief -      Ct Angio Head W/cm &/or Wo Cm  07/09/2015  EXAM: CT ANGIOGRAPHY HEAD AND NECK TECHNIQUE: Multidetector CT imaging of the head and neck was performed using the standard protocol during bolus administration of intravenous contrast. Multiplanar CT image reconstructions and MIPs were obtained to evaluate the vascular anatomy. Carotid stenosis measurements (when applicable) are obtained utilizing NASCET criteria, using the distal internal carotid diameter as the denominator. CONTRAST:  100 cc of Isovue 370. COMPARISON:  Prior CT from earlier same day. FINDINGS: CTA NECK Aortic arch: Valuation of the aortic arch limited due to timing of the contrast bolus. Similarly, origin of the great vessels not well evaluated. Scattered atheromatous plaque within the arch itself. Subclavian arteries not well evaluated on this exam due to timing the contrast bolus. Right carotid system: Origin of the right common carotid artery not well seen due to streak artifact from majority of the contrast in the venous system. Right common carotid artery grossly patent distally to the bifurcation without definite stenosis or acute abnormality, although evaluation limited by motion and contrast bolus timing. Mild scattered plaque about the right bifurcation/ proximal right ICA. Associated short-segment stenosis of approximately 75% by NASCET criteria. Distally, right ICA patent to the skullbase. Atheromatous plaque within the distal right ICA with mild to moderate multi focal narrowing. Left carotid system: Left common carotid artery  not well evaluated proximally due to timing of the contrast bolus. Left common  carotid artery grossly patent to the bifurcation. Scattered calcified plaque about the left bifurcation/proximal left ICA without flow limiting stenosis. Left ICA patent to the skullbase. Scattered eccentric plaque within the distal left ICA without significant narrowing. Vertebral arteries:Origin of the vertebral arteries from the subclavian arteries. Proximally, vertebral arteries not well evaluated due to contrast bolus timing. Vertebral arteries grossly patent to the skullbase without obvious stenosis or other acute abnormality. Skeleton: Patient is status post ACDF at C4 through C7. No worrisome lytic or blastic osseous lesions. Other neck: Emphysematous changes noted within the visualized lungs. Approximately 1 cm spiculated nodule within the right upper lobe (series 6, image 23). Visualized lungs are otherwise clear. Visualized superior mediastinum within normal limits. Thyroid grossly unremarkable. No definite adenopathy within the neck. No acute soft tissue abnormality. CTA HEAD Anterior circulation: Multi focal atheromatous irregularity throughout the horizontal petrous right ICA, which is severely attenuated. This is not well evaluated on this motion degraded study. Extensive atheromatous plaque throughout the cavernous right ICA with moderate to severe multi focal narrowing. Supraclinoid segment is widely patent. Petrous left ICA widely patent. Extensive calcified plaque throughout the cavernous left ICA with moderate multi focal narrowing. Supraclinoid ICA patent. Left A1 segment dominant and patent. Right A1 segment hypoplastic and/ or absent. Anterior communicating artery and anterior cerebral arteries opacified. Left M1 segment widely patent without stenosis or occlusion. Left MCA bifurcation normal. No proximal M2 branch stenosis or occlusion. Distal left MCA branches opacified with atheromatous irregularity. Right M1 segment patent without stenosis or occlusion. Right MCA bifurcation within normal  limits. No proximal M2 branch occlusion. Distal right MCA branches opacified with multi focal atheromatous irregularity. Posterior circulation: Vertebral arteries patent to the vertebrobasilar junction. Left vertebral artery dominant. Posterior inferior cerebral arteries patent proximally. Basilar artery patent to its distal aspect. Superior cerebral arteries patent bilaterally. Both of the posterior cerebral arteries arise knee basilar artery and are well opacified to their distal aspects. Venous sinuses: Grossly patent without venous sinus thrombosis. Anatomic variants: Hypoplastic/absent right A1 segment. No aneurysm or vascular malformation. Delayed phase: No pathologic enhancement. Possible small focus of hemorrhage within the parasagittal anterior left frontal lobe again noted. IMPRESSION: CTA NECK IMPRESSION: 1. Limited study by motion artifact and timing of the contrast bolus. 2. Atheromatous plaque at the proximal right ICA with short-segment stenosis of approximately 75% by NASCET criteria. 3. Additional atheromatous plaque at the distal right ICA with moderate multi focal narrowing (up to approximately 50%). 4. Atheromatous plaque about the left carotid bifurcation without high-grade flow-limiting stenosis. Additional atheromatous plaque within the distal left ICA without significant stenosis. 5. Grossly patent vertebral arteries within the neck. 6. Emphysema. 7. 1 cm spiculated nodular opacity within the right upper lobe, indeterminate. Consider one of the following in 3 months for both low-risk and high-risk individuals: (a) repeat chest CT, (b) follow-up PET-CT, or (c) tissue sampling. This recommendation follows the consensus statement: Guidelines for Management of Incidental Pulmonary Nodules Detected on CT Images:From the Fleischner Society 2017; published online before print (10.1148/radiol.IJ:2314499). CTA HEAD IMPRESSION: 1. Negative for emergent large vessel occlusion. 2. Severe atheromatous  disease throughout the petrous and cavernous right ICA with moderate to severe multi focal narrowing. Flow within the horizontal petrous right ICA in particular is severely attenuated, and possibly nearly occluded. 3. Atheromatous plaque throughout the cavernous left ICA with moderate multifocal stenosis. Anterior cerebral artery supplied via the left internal carotid artery  system as the right A1 segment is hypoplastic/absent. 4. Patent vertebrobasilar system without high-grade flow-limiting stenosis. Electronically Signed   By: Jeannine Boga M.D.   On: 07/09/2015 00:28   Dg Chest 1 View  07/09/2015  CLINICAL DATA:  Altered mental status EXAM: CHEST 1 VIEW COMPARISON:  11/17/2012 chest radiograph. FINDINGS: Stable cardiomediastinal silhouette with normal heart size. No pneumothorax. No pleural effusion. Lungs appear clear, with no acute consolidative airspace disease and no pulmonary edema. IMPRESSION: No active disease. Electronically Signed   By: Ilona Sorrel M.D.   On: 07/09/2015 11:01   Ct Head Wo Contrast  07/08/2015  CLINICAL DATA:  Nonverbal in confusion with onset 45 minutes ago. History of diabetes and chronic kidney disease. Code stroke. EXAM: CT HEAD WITHOUT CONTRAST TECHNIQUE: Contiguous axial images were obtained from the base of the skull through the vertex without intravenous contrast. COMPARISON:  11/09/2014 FINDINGS: Diffuse cerebral atrophy. No ventricular dilatation. Low-attenuation changes in the deep white matter consistent with small vessel ischemia. There is a tiny focus of increased density in the left anterior frontal lobe white matter. This was not present previously and may represent a tiny focal area of acute intraparenchymal hemorrhage. No abnormal extra-axial fluid collections. No mass effect or midline shift. Gray-white matter junctions are distinct. Basal cisterns are not effaced. Basal ganglia calcifications. Calvarium appears intact. Mild medial right orbital wall  deformity possibly representing old fracture deformity. Mastoid air cells and paranasal sinuses are not opacified. Vascular calcifications. Hearing aids bilaterally. IMPRESSION: Possible tiny focus of acute intraparenchymal hemorrhage in the left anterior frontal lobe. Otherwise, no acute changes are demonstrated. Chronic atrophy and small vessel ischemic changes. These results were called by telephone at the time of interpretation on 07/08/2015 at 9:12 pm to Dr. Ezequiel Essex , who verbally acknowledged these results. Electronically Signed   By: Lucienne Capers M.D.   On: 07/08/2015 21:17   Ct Angio Neck W/cm &/or Wo/cm  07/09/2015  EXAM: CT ANGIOGRAPHY HEAD AND NECK TECHNIQUE: Multidetector CT imaging of the head and neck was performed using the standard protocol during bolus administration of intravenous contrast. Multiplanar CT image reconstructions and MIPs were obtained to evaluate the vascular anatomy. Carotid stenosis measurements (when applicable) are obtained utilizing NASCET criteria, using the distal internal carotid diameter as the denominator. CONTRAST:  100 cc of Isovue 370. COMPARISON:  Prior CT from earlier same day. FINDINGS: CTA NECK Aortic arch: Valuation of the aortic arch limited due to timing of the contrast bolus. Similarly, origin of the great vessels not well evaluated. Scattered atheromatous plaque within the arch itself. Subclavian arteries not well evaluated on this exam due to timing the contrast bolus. Right carotid system: Origin of the right common carotid artery not well seen due to streak artifact from majority of the contrast in the venous system. Right common carotid artery grossly patent distally to the bifurcation without definite stenosis or acute abnormality, although evaluation limited by motion and contrast bolus timing. Mild scattered plaque about the right bifurcation/ proximal right ICA. Associated short-segment stenosis of approximately 75% by NASCET criteria.  Distally, right ICA patent to the skullbase. Atheromatous plaque within the distal right ICA with mild to moderate multi focal narrowing. Left carotid system: Left common carotid artery not well evaluated proximally due to timing of the contrast bolus. Left common carotid artery grossly patent to the bifurcation. Scattered calcified plaque about the left bifurcation/proximal left ICA without flow limiting stenosis. Left ICA patent to the skullbase. Scattered eccentric plaque within the  distal left ICA without significant narrowing. Vertebral arteries:Origin of the vertebral arteries from the subclavian arteries. Proximally, vertebral arteries not well evaluated due to contrast bolus timing. Vertebral arteries grossly patent to the skullbase without obvious stenosis or other acute abnormality. Skeleton: Patient is status post ACDF at C4 through C7. No worrisome lytic or blastic osseous lesions. Other neck: Emphysematous changes noted within the visualized lungs. Approximately 1 cm spiculated nodule within the right upper lobe (series 6, image 23). Visualized lungs are otherwise clear. Visualized superior mediastinum within normal limits. Thyroid grossly unremarkable. No definite adenopathy within the neck. No acute soft tissue abnormality. CTA HEAD Anterior circulation: Multi focal atheromatous irregularity throughout the horizontal petrous right ICA, which is severely attenuated. This is not well evaluated on this motion degraded study. Extensive atheromatous plaque throughout the cavernous right ICA with moderate to severe multi focal narrowing. Supraclinoid segment is widely patent. Petrous left ICA widely patent. Extensive calcified plaque throughout the cavernous left ICA with moderate multi focal narrowing. Supraclinoid ICA patent. Left A1 segment dominant and patent. Right A1 segment hypoplastic and/ or absent. Anterior communicating artery and anterior cerebral arteries opacified. Left M1 segment widely  patent without stenosis or occlusion. Left MCA bifurcation normal. No proximal M2 branch stenosis or occlusion. Distal left MCA branches opacified with atheromatous irregularity. Right M1 segment patent without stenosis or occlusion. Right MCA bifurcation within normal limits. No proximal M2 branch occlusion. Distal right MCA branches opacified with multi focal atheromatous irregularity. Posterior circulation: Vertebral arteries patent to the vertebrobasilar junction. Left vertebral artery dominant. Posterior inferior cerebral arteries patent proximally. Basilar artery patent to its distal aspect. Superior cerebral arteries patent bilaterally. Both of the posterior cerebral arteries arise knee basilar artery and are well opacified to their distal aspects. Venous sinuses: Grossly patent without venous sinus thrombosis. Anatomic variants: Hypoplastic/absent right A1 segment. No aneurysm or vascular malformation. Delayed phase: No pathologic enhancement. Possible small focus of hemorrhage within the parasagittal anterior left frontal lobe again noted. IMPRESSION: CTA NECK IMPRESSION: 1. Limited study by motion artifact and timing of the contrast bolus. 2. Atheromatous plaque at the proximal right ICA with short-segment stenosis of approximately 75% by NASCET criteria. 3. Additional atheromatous plaque at the distal right ICA with moderate multi focal narrowing (up to approximately 50%). 4. Atheromatous plaque about the left carotid bifurcation without high-grade flow-limiting stenosis. Additional atheromatous plaque within the distal left ICA without significant stenosis. 5. Grossly patent vertebral arteries within the neck. 6. Emphysema. 7. 1 cm spiculated nodular opacity within the right upper lobe, indeterminate. Consider one of the following in 3 months for both low-risk and high-risk individuals: (a) repeat chest CT, (b) follow-up PET-CT, or (c) tissue sampling. This recommendation follows the consensus statement:  Guidelines for Management of Incidental Pulmonary Nodules Detected on CT Images:From the Fleischner Society 2017; published online before print (10.1148/radiol.IJ:2314499). CTA HEAD IMPRESSION: 1. Negative for emergent large vessel occlusion. 2. 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 Flow within the horizontal petrous right ICA in particular is severely attenuated, and possibly nearly occluded. 3. Atheromatous plaque throughout the cavernous left ICA with moderate multifocal stenosis. Anterior cerebral artery supplied via the left internal carotid artery system as the right A1 segment is hypoplastic/absent. 4. Patent vertebrobasilar system without high-grade flow-limiting stenosis. Electronically Signed   By: Jeannine Boga M.D.   On: 07/09/2015 00:28   Mr Brain Wo Contrast  07/09/2015  CLINICAL DATA:  Altered mental status.  EXAM: MRI HEAD WITHOUT CONTRAST TECHNIQUE: Multiplanar, multiecho pulse sequences of the brain and surrounding structures were obtained without intravenous contrast. COMPARISON:  CTA head neck 07/08/2015. MR head 11/17/2012. Most recent noncontrast head CT 07/08/2015. FINDINGS: Low level restricted diffusion posterior limb internal capsule on the RIGHT, 5 mm, appears to be confirmed on coronal DWI, suspected subacute infarct. Small focus of acute/subacute hemorrhage suspected on CT is confirmed, LEFT anterior and medial frontal lobe, subcentimeter size, see gradient sequence 9 image 13. Some T1 shortening suggesting subacute time course. Minimal surrounding edema. No other areas of hemorrhage, mass lesion, hydrocephalus, or extra-axial fluid. Mild cerebral and cerebellar atrophy. Remote brainstem lacunar infarct. Extensive T2 and FLAIR hyperintensities throughout the white matter, favored to represent chronic microvascular ischemic change. No midline abnormality. Extracranial soft tissues unremarkable.  IMPRESSION: Small focus of acute to subacute hemorrhage LEFT frontal lobe is nonspecific. This abnormality was not present in 2014. Considerations would include hypertensive related cerebral vascular disease or occult trauma. No previous abnormality in this region to suggest cavernoma. No secondary signs of cortical venous thrombosis. 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 Chronic changes as described. Electronically Signed   By: Staci Righter M.D.   On: 07/09/2015 10:17    Micro Results     No results found for this or any previous visit (from the past 240 hour(s)).     Today   Subjective    Allen Yu today has no headache,no chest abdominal pain,no new weakness tingling or numbness, feels much better wants to go home today.    Objective   Blood pressure 117/64, pulse 76, temperature 98.4 F (36.9 C), temperature source Oral, resp. rate 18, height 6\' 1"  (1.854 m), weight 68.5 kg (151 lb 0.2 oz), SpO2 100 %.   Intake/Output Summary (Last 24 hours) at 07/10/15 0649 Last data filed at 07/09/15 1800  Gross per 24 hour  Intake    240 ml  Output    600 ml  Net   -360 ml    Exam Awake Alert, Oriented x 3, No new F.N deficits, Normal affect [place].AT,PERRAL Supple Neck,No JVD, No cervical lymphadenopathy appriciated.  Symmetrical Chest wall movement, Good air movement bilaterally, CTAB RRR,No Gallops,Rubs or new Murmurs, No Parasternal Heave +ve B.Sounds, Abd Soft, Non tender, No organomegaly appriciated, No rebound -guarding or rigidity. No Cyanosis, Clubbing or edema, No new Rash or bruise No pronator drift Motor 5/5 in all 4 ext, reflexes 2+ symmetric, with downgoing toes bilaterally   Data Review   CBC w Diff: Lab Results  Component Value Date   WBC 6.5 07/08/2015   HGB 10.4* 07/08/2015   HCT 29.3* 07/08/2015   PLT 204 07/08/2015   LYMPHOPCT 29 07/08/2015   MONOPCT 6 07/08/2015   EOSPCT 3 07/08/2015   BASOPCT 0 07/08/2015    CMP: Lab  Results  Component Value Date   NA 134* 07/09/2015   K 3.8 07/09/2015   CL 107 07/09/2015   CO2 24 07/09/2015   BUN 27* 07/09/2015   CREATININE 1.23 07/09/2015   PROT 6.9 07/08/2015   ALBUMIN 4.2 07/08/2015   BILITOT 0.9 07/08/2015   ALKPHOS 95 07/08/2015   AST 19 07/08/2015   ALT 17 07/08/2015  .   Total Time in preparing paper work, data evaluation and todays exam - 5 minutes  Jani Gravel M.D on 07/10/2015 at 6:49 AM

## 2015-07-10 NOTE — Progress Notes (Signed)
  Echocardiogram 2D Echocardiogram has been performed.  Allen Yu 07/10/2015, 1:01 PM

## 2015-07-11 LAB — ECHOCARDIOGRAM COMPLETE
CHL CUP MV DEC (S): 298
E/e' ratio: 7.63
EWDT: 298 ms
FS: 20 % — AB (ref 28–44)
Height: 73 in
IVS/LV PW RATIO, ED: 1.25
LA vol index: 21.7 mL/m2
LADIAMINDEX: 1.5 cm/m2
LASIZE: 28 mm
LAVOL: 40.6 mL
LAVOLA4C: 34.6 mL
LDCA: 3.46 cm2
LEFT ATRIUM END SYS DIAM: 28 mm
LV E/e' medial: 7.63
LV E/e'average: 7.63
LV PW d: 9.13 mm — AB (ref 0.6–1.1)
LV e' LATERAL: 11 cm/s
LVOT diameter: 21 mm
MV pk E vel: 83.9 m/s
MVPG: 3 mmHg
MVPKAVEL: 74.7 m/s
RV TAPSE: 21.2 mm
TDI e' lateral: 11
TDI e' medial: 8.16
WEIGHTICAEL: 2416.24 [oz_av]

## 2015-07-11 LAB — FOLATE RBC
FOLATE, HEMOLYSATE: 294 ng/mL
Folate, RBC: 939 ng/mL (ref >498)
Hematocrit: 31.3 % — ABNORMAL LOW (ref 37.5–51.0)

## 2015-07-11 LAB — HEMOGLOBIN A1C
Hgb A1c MFr Bld: 7.5 % — ABNORMAL HIGH (ref 4.8–5.6)
Mean Plasma Glucose: 169 mg/dL

## 2015-07-15 DIAGNOSIS — E1129 Type 2 diabetes mellitus with other diabetic kidney complication: Secondary | ICD-10-CM | POA: Diagnosis not present

## 2015-07-15 DIAGNOSIS — I6523 Occlusion and stenosis of bilateral carotid arteries: Secondary | ICD-10-CM | POA: Diagnosis not present

## 2015-07-15 DIAGNOSIS — I639 Cerebral infarction, unspecified: Secondary | ICD-10-CM | POA: Diagnosis not present

## 2015-07-15 LAB — RPR: RPR Ser Ql: NONREACTIVE

## 2015-07-15 LAB — HIV ANTIBODY (ROUTINE TESTING W REFLEX): HIV Screen 4th Generation wRfx: NONREACTIVE

## 2015-07-28 ENCOUNTER — Encounter: Payer: Self-pay | Admitting: Vascular Surgery

## 2015-07-29 ENCOUNTER — Ambulatory Visit (INDEPENDENT_AMBULATORY_CARE_PROVIDER_SITE_OTHER): Payer: Medicare Other | Admitting: Vascular Surgery

## 2015-07-29 ENCOUNTER — Ambulatory Visit (HOSPITAL_COMMUNITY)
Admission: RE | Admit: 2015-07-29 | Discharge: 2015-07-29 | Disposition: A | Discharge status: Home / Self Care | Payer: Medicare Other | Source: Ambulatory Visit | Attending: Vascular Surgery | Admitting: Vascular Surgery

## 2015-07-29 ENCOUNTER — Encounter: Payer: Self-pay | Admitting: Vascular Surgery

## 2015-07-29 VITALS — BP 152/79 | HR 76 | Ht 73.0 in | Wt 157.3 lb

## 2015-07-29 DIAGNOSIS — I6523 Occlusion and stenosis of bilateral carotid arteries: Secondary | ICD-10-CM

## 2015-07-29 LAB — VAS US CAROTID
LCCADSYS: 72 cm/s
LEFT ECA DIAS: -5 cm/s
LICADDIAS: -36 cm/s
LICAPDIAS: -31 cm/s
Left CCA dist dias: 16 cm/s
Left CCA prox dias: 19 cm/s
Left CCA prox sys: 100 cm/s
Left ICA dist sys: -126 cm/s
Left ICA prox sys: -97 cm/s
RCCAPDIAS: 9 cm/s
RIGHT CCA MID DIAS: 9 cm/s
RIGHT ECA DIAS: -11 cm/s
Right CCA prox sys: 90 cm/s
Right cca dist sys: -77 cm/s

## 2015-07-29 NOTE — Progress Notes (Signed)
[facility]  Patient name: Allen Yu MRN: [id number] DOB: 1945-05-26 Sex: male  REASON FOR CONSULT: Right carotid stenosis. Referred by Dr. [name]  HPI: Allen Yu is a 70 y.o. male, who is referred for evaluation of a right carotid stenosis. This patient had been hospitalized in June of this year when he was found poorly responsive by his wife. He has multiple medical issues including insulin-dependent diabetes, chronic kidney disease, hypertension. His workup included a CT angiogram which suggested that he had a 75% right internal carotid artery stenosis. He was sent for vascular consultation. Of note, he was also found to have a subacute hemorrhage in the left frontal lobe. He also had a spiculated lung nodule. I believe the etiology of his change in mental status was related to hypoglycemia. This is under better control now.  He is right-handed. He denies any history of stroke, TIAs, expressive or receptive aphasia, or amaurosis fugax.  His risk factors for vascular disease include diabetes, smoking, a family history of premature cardiovascular disease.  Past Medical History  Diagnosis Date  . Diabetes mellitus   . Chronic kidney disease     stones hx  . PTSD (post-traumatic stress disorder)   . GERD (gastroesophageal reflux disease)   . Chronic back pain   . Schizophrenia ([place])   . Hypertension   . Tuberculosis     MED TX X 1 YEAR YEARS AGO   . Arthritis   . Peripheral neuropathy ([place])   . Lung nodule   . Carotid stenosis   . Anemia     Family History  Problem Relation Age of Onset  . Heart disease Mother     before age 83    SOCIAL HISTORY: Social History   Social History  . Marital Status: Married    Spouse Name: N/A  . Number of Children: N/A  . Years of Education: N/A   Occupational History  . Not on file.   Social History Main Topics  . Smoking status: Current Every Day Smoker -- 1.00 packs/day    Types:  Cigarettes  . Smokeless tobacco: Not on file  . Alcohol Use: No  . Drug Use: Yes    Special: Marijuana     Comment: occasional use of marijuana   . Sexual Activity: No   Other Topics Concern  . Not on file   Social History Narrative    Allergies  Allergen Reactions  . Bee Venom Other (See Comments)    Unknown   . Codeine Other (See Comments)    incoherent   . Diovan [Valsartan] Other (See Comments)    incoherent    Current Outpatient Prescriptions  Medication Sig Dispense Refill  . aspirin EC 81 MG tablet Take 81 mg by mouth at bedtime.     [name] atorvastatin (LIPITOR) 40 MG tablet Take 40 mg by mouth daily.  4  . donepezil (ARICEPT) 5 MG tablet Take 5 mg by mouth at bedtime.    . gabapentin (NEURONTIN) 600 MG tablet Take 600 mg by mouth 2 (two) times daily.  4  . insulin lispro (HUMALOG) 100 UNIT/ML injection Inject 10-20 Units into the skin 3 (three) times daily before meals.     . insulin NPH-regular Human (NOVOLIN 70/30) (70-30) 100 UNIT/ML injection Inject 70 Units into the skin at bedtime.    [name] losartan (COZAAR) 50 MG tablet Take 50 mg by mouth at bedtime.     . Tamsulosin HCl (FLOMAX)  0.4 MG CAPS Take 0.4 mg by mouth daily after supper.      No current facility-administered medications for this visit.   Facility-Administered Medications Ordered in Other Visits  Medication Dose Route Frequency Provider Last Rate Last Dose  . ondansetron (ZOFRAN) 4 mg in sodium chloride 0.9 % 50 mL IVPB  4 mg Intravenous Once Ashok Pall, MD        REVIEW OF SYSTEMS:  [X]  denotes positive finding, [ ]  denotes negative finding Cardiac  Comments:  Chest pain or chest pressure:    Shortness of breath upon exertion: X   Short of breath when lying flat:    Irregular heart rhythm:        Vascular    Pain in calf, thigh, or hip brought on by ambulation: X   Pain in feet at night that wakes you up from your sleep:  X neuropathy  Blood clot in your veins:    Leg swelling:           Pulmonary    Oxygen at home:    Productive cough:     Wheezing:         Neurologic    Sudden weakness in arms or legs:     Sudden numbness in arms or legs:     Sudden onset of difficulty speaking or slurred speech:    Temporary loss of vision in one eye:     Problems with dizziness:         Gastrointestinal    Blood in stool:     Vomited blood:         Genitourinary    Burning when urinating:     Blood in urine:        Psychiatric    Major depression:         Hematologic    Bleeding problems:    Problems with blood clotting too easily:        Skin    Rashes or ulcers:        Constitutional    Fever or chills:      PHYSICAL EXAM: Filed Vitals:   07/29/15 1047 07/29/15 1049  BP: 149/74 152/79  Pulse: 76   Height: 6\' 1"  (1.854 m)   Weight: 157 lb 4.8 oz (71.351 kg)   SpO2: 100%     GENERAL: The patient is a well-nourished male, in no acute distress. The vital signs are documented above. CARDIAC: There is a regular rate and rhythm.  VASCULAR: I do not detect carotid bruits. He has palpable femoral pulses. I cannot palpate pedal pulses however both feet are warm and well-perfused. PULMONARY: There is good air exchange bilaterally without wheezing or rales. ABDOMEN: Soft and non-tender with normal pitched bowel sounds.  MUSCULOSKELETAL: There are no major deformities or cyanosis. NEUROLOGIC: No focal weakness or paresthesias are detected. SKIN: There are no ulcers or rashes noted. PSYCHIATRIC: The patient has a normal affect.   DATA:   CT ANGIOGRAM NECK: I have reviewed his CT angiogram of the neck which was done on 07/08/2015. The study was limited because he was moving some. However there appeared to be some plaque in the proximal right internal carotid artery and possibly a 75% stenosis. There was no significant stenosis noted on the left.  CAROTID DUPLEX: I ordered a carotid duplex scan today as I do not think that the CT scan is reliable in predicting the  severity of ICA stenosis because of artifact. This showed no significant carotid  stenosis on either side. There was a less than 39% stenosis. There was tortuosity bilaterally. Vertebral arteries were patent with antegrade flow.  MEDICAL ISSUES:  RIGHT CAROTID STENOSIS: Although his CT scan suggests a right carotid stenosis, duplex today shows no evidence of significant carotid disease on either side. There was some artifact with the CT scan and I think that the suggestion of a right carotid stenosis was not accurate. He had a less than 39% carotid stenosis bilaterally. There was some tortuosity to the vessels. He is asymptomatic. I do not think any further workup is needed for this. He is on aspirin. I have discussed with him the importance of tobacco cessation. I'll be happy to see him back any time if any new vascular issues arise.   Deitra Mayo [facility] [phone]

## 2015-08-03 DIAGNOSIS — E1143 Type 2 diabetes mellitus with diabetic autonomic (poly)neuropathy: Secondary | ICD-10-CM | POA: Diagnosis not present

## 2015-08-03 DIAGNOSIS — R260 Ataxic gait: Secondary | ICD-10-CM | POA: Diagnosis not present

## 2015-08-03 DIAGNOSIS — I63031 Cerebral infarction due to thrombosis of right carotid artery: Secondary | ICD-10-CM | POA: Diagnosis not present

## 2015-08-03 DIAGNOSIS — I6523 Occlusion and stenosis of bilateral carotid arteries: Secondary | ICD-10-CM | POA: Diagnosis not present

## 2015-08-03 DIAGNOSIS — I610 Nontraumatic intracerebral hemorrhage in hemisphere, subcortical: Secondary | ICD-10-CM | POA: Diagnosis not present

## 2015-08-22 ENCOUNTER — Other Ambulatory Visit (HOSPITAL_COMMUNITY): Payer: Self-pay | Admitting: Pulmonary Disease

## 2015-08-22 DIAGNOSIS — J449 Chronic obstructive pulmonary disease, unspecified: Secondary | ICD-10-CM | POA: Diagnosis not present

## 2015-08-22 DIAGNOSIS — R911 Solitary pulmonary nodule: Secondary | ICD-10-CM

## 2015-09-06 DIAGNOSIS — Z794 Long term (current) use of insulin: Secondary | ICD-10-CM | POA: Diagnosis not present

## 2015-09-06 DIAGNOSIS — H2513 Age-related nuclear cataract, bilateral: Secondary | ICD-10-CM | POA: Diagnosis not present

## 2015-09-14 DIAGNOSIS — E785 Hyperlipidemia, unspecified: Secondary | ICD-10-CM | POA: Diagnosis not present

## 2015-09-14 DIAGNOSIS — Z79899 Other long term (current) drug therapy: Secondary | ICD-10-CM | POA: Diagnosis not present

## 2015-09-14 DIAGNOSIS — E119 Type 2 diabetes mellitus without complications: Secondary | ICD-10-CM | POA: Diagnosis not present

## 2015-09-14 DIAGNOSIS — R809 Proteinuria, unspecified: Secondary | ICD-10-CM | POA: Diagnosis not present

## 2015-09-20 DIAGNOSIS — Z8673 Personal history of transient ischemic attack (TIA), and cerebral infarction without residual deficits: Secondary | ICD-10-CM | POA: Diagnosis not present

## 2015-09-20 DIAGNOSIS — N183 Chronic kidney disease, stage 3 (moderate): Secondary | ICD-10-CM | POA: Diagnosis not present

## 2015-09-20 DIAGNOSIS — E1129 Type 2 diabetes mellitus with other diabetic kidney complication: Secondary | ICD-10-CM | POA: Diagnosis not present

## 2015-10-10 ENCOUNTER — Ambulatory Visit (HOSPITAL_COMMUNITY)
Admission: RE | Admit: 2015-10-10 | Discharge: 2015-10-10 | Disposition: A | Discharge status: Home / Self Care | Payer: Medicare Other | Source: Ambulatory Visit | Attending: Pulmonary Disease | Admitting: Pulmonary Disease

## 2015-10-10 DIAGNOSIS — R739 Hyperglycemia, unspecified: Secondary | ICD-10-CM | POA: Insufficient documentation

## 2015-10-10 DIAGNOSIS — R911 Solitary pulmonary nodule: Secondary | ICD-10-CM

## 2015-10-10 LAB — GLUCOSE, CAPILLARY: Glucose-Capillary: 274 mg/dL — ABNORMAL HIGH (ref 65–99)

## 2015-10-13 ENCOUNTER — Encounter (HOSPITAL_COMMUNITY): Payer: Self-pay

## 2015-10-13 ENCOUNTER — Emergency Department (HOSPITAL_COMMUNITY)
Admission: EM | Admit: 2015-10-13 | Discharge: 2015-10-13 | Disposition: A | Discharge status: Home / Self Care | Payer: Medicare Other | Attending: Emergency Medicine | Admitting: Emergency Medicine

## 2015-10-13 ENCOUNTER — Emergency Department (HOSPITAL_COMMUNITY): Payer: Medicare Other

## 2015-10-13 ENCOUNTER — Encounter (HOSPITAL_COMMUNITY)
Admission: RE | Admit: 2015-10-13 | Discharge: 2015-10-13 | Disposition: A | Discharge status: Home / Self Care | Payer: Medicare Other | Source: Ambulatory Visit | Attending: Pulmonary Disease | Admitting: Pulmonary Disease

## 2015-10-13 DIAGNOSIS — Z7982 Long term (current) use of aspirin: Secondary | ICD-10-CM | POA: Insufficient documentation

## 2015-10-13 DIAGNOSIS — F17210 Nicotine dependence, cigarettes, uncomplicated: Secondary | ICD-10-CM | POA: Insufficient documentation

## 2015-10-13 DIAGNOSIS — I129 Hypertensive chronic kidney disease with stage 1 through stage 4 chronic kidney disease, or unspecified chronic kidney disease: Secondary | ICD-10-CM | POA: Insufficient documentation

## 2015-10-13 DIAGNOSIS — N182 Chronic kidney disease, stage 2 (mild): Secondary | ICD-10-CM | POA: Insufficient documentation

## 2015-10-13 DIAGNOSIS — Z794 Long term (current) use of insulin: Secondary | ICD-10-CM | POA: Insufficient documentation

## 2015-10-13 DIAGNOSIS — E162 Hypoglycemia, unspecified: Secondary | ICD-10-CM | POA: Insufficient documentation

## 2015-10-13 DIAGNOSIS — E1122 Type 2 diabetes mellitus with diabetic chronic kidney disease: Secondary | ICD-10-CM | POA: Diagnosis not present

## 2015-10-13 DIAGNOSIS — Z79899 Other long term (current) drug therapy: Secondary | ICD-10-CM | POA: Insufficient documentation

## 2015-10-13 DIAGNOSIS — E11649 Type 2 diabetes mellitus with hypoglycemia without coma: Secondary | ICD-10-CM | POA: Insufficient documentation

## 2015-10-13 LAB — URINE MICROSCOPIC-ADD ON

## 2015-10-13 LAB — BASIC METABOLIC PANEL
ANION GAP: 9 (ref 5–15)
BUN: 19 mg/dL (ref 6–20)
CALCIUM: 9.7 mg/dL (ref 8.9–10.3)
CO2: 21 mmol/L — ABNORMAL LOW (ref 22–32)
Chloride: 110 mmol/L (ref 101–111)
Creatinine, Ser: 1.22 mg/dL (ref 0.61–1.24)
GFR, EST NON AFRICAN AMERICAN: 59 mL/min — AB (ref >60)
GLUCOSE: 67 mg/dL (ref 65–99)
POTASSIUM: 3.8 mmol/L (ref 3.5–5.1)
Sodium: 140 mmol/L (ref 135–145)

## 2015-10-13 LAB — CBG MONITORING, ED
GLUCOSE-CAPILLARY: 68 mg/dL (ref 65–99)
GLUCOSE-CAPILLARY: 84 mg/dL (ref 65–99)
GLUCOSE-CAPILLARY: 42 mg/dL — AB (ref 65–99)
GLUCOSE-CAPILLARY: 99 mg/dL (ref 65–99)
Glucose-Capillary: 99 mg/dL (ref 65–99)

## 2015-10-13 LAB — GLUCOSE, CAPILLARY
Glucose-Capillary: 44 mg/dL — CL (ref 65–99)
Glucose-Capillary: 46 mg/dL — ABNORMAL LOW (ref 65–99)
Glucose-Capillary: 66 mg/dL (ref 65–99)

## 2015-10-13 LAB — CBC WITH DIFFERENTIAL/PLATELET
BASOS ABS: 0 10*3/uL (ref 0.0–0.1)
BASOS PCT: 0 %
EOS PCT: 0 %
Eosinophils Absolute: 0 10*3/uL (ref 0.0–0.7)
HCT: 31 % — ABNORMAL LOW (ref 39.0–52.0)
Hemoglobin: 10.5 g/dL — ABNORMAL LOW (ref 13.0–17.0)
LYMPHS PCT: 17 %
Lymphs Abs: 1.4 10*3/uL (ref 0.7–4.0)
MCH: 30.5 pg (ref 26.0–34.0)
MCHC: 33.9 g/dL (ref 30.0–36.0)
MCV: 90.1 fL (ref 78.0–100.0)
Monocytes Absolute: 0.4 10*3/uL (ref 0.1–1.0)
Monocytes Relative: 5 %
NEUTROS ABS: 6.3 10*3/uL (ref 1.7–7.7)
Neutrophils Relative %: 78 %
PLATELETS: 248 10*3/uL (ref 150–400)
RBC: 3.44 MIL/uL — AB (ref 4.22–5.81)
RDW: 13.7 % (ref 11.5–15.5)
WBC: 8.2 10*3/uL (ref 4.0–10.5)

## 2015-10-13 LAB — URINALYSIS, ROUTINE W REFLEX MICROSCOPIC
Bilirubin Urine: NEGATIVE
GLUCOSE, UA: NEGATIVE mg/dL
Hgb urine dipstick: NEGATIVE
KETONES UR: NEGATIVE mg/dL
LEUKOCYTES UA: NEGATIVE
NITRITE: NEGATIVE
PROTEIN: 30 mg/dL — AB
Specific Gravity, Urine: 1.02 (ref 1.005–1.030)
pH: 5 (ref 5.0–8.0)

## 2015-10-13 NOTE — ED Notes (Signed)
Pt is not wanting to give urine sample.

## 2015-10-13 NOTE — ED Provider Notes (Signed)
[facility] DEPT Provider Note   CSN: [id number] Arrival date & time: 10/13/15  0856     History   Chief Complaint Chief Complaint  Patient presents with  . Hypoglycemia    HPI Allen Yu is a 70 y.o. male with a past medical history significant for chronic disease, diabetes, GERD, hypertension, schizophrenia, prior TB who was sent from nuclear medicine after an episode of hypoglycemia. Patient reports he was getting a PET scan to investigate nodules. Patient was found to have a glucose in the 40s. Patient reports taking his Lantus 30 units this morning prior to coming in for his imaging study. Patient reports he was nothing by mouth and had not eaten since last night.   On arrival, nursing reports that the nuclear medicine team requested patient not be given D50 which might mess up with her study. They did clear the patient to have Orange juice as they did not feel this would interact with his study.  Patient denies any fevers, chills, chest pain, shortness of breath, nausea, vomiting, constipation, diarrhea or dysuria. Patient does feel "shakey".     The history is provided by the patient and medical records. No language interpreter was used.  Hypoglycemia  Initial blood sugar:  44 Blood sugar after intervention:  99 Severity:  Moderate Onset quality:  Gradual Duration:  1 day Timing:  Intermittent Progression:  Waxing and waning Chronicity:  New Diabetic status:  Controlled with insulin Current diabetic therapy:  Lantus and novolin Time since last antidiabetic medication:  5 hours Context: decreased oral intake (pt NPO for imaging study)   Relieved by:  Nothing Ineffective treatments:  None tried Associated symptoms: tremors   Associated symptoms: no altered mental status, no dizziness, no seizures, no shortness of breath, no speech difficulty, no sweats, no visual change, no vomiting and no weakness     Past Medical History:  Diagnosis Date  . Anemia   .  Arthritis   . Carotid stenosis   . Chronic back pain   . Chronic kidney disease    stones hx  . Diabetes mellitus   . GERD (gastroesophageal reflux disease)   . Hypertension   . Lung nodule   . Peripheral neuropathy ([place])   . PTSD (post-traumatic stress disorder)   . Schizophrenia ([place])   . Tuberculosis    MED TX X 1 YEAR YEARS AGO     Patient Active Problem List   Diagnosis Date Noted  . Lung nodule seen on imaging study 07/09/2015  . Acute encephalopathy   . Hypoglycemia due to insulin   . Hypertension 07/08/2015  . Insulin dependent diabetes mellitus ([place]) 07/08/2015  . AKI (acute kidney injury) ([place]) 07/08/2015  . Normocytic anemia 07/08/2015  . Tobacco abuse 07/08/2015  . Diverticulosis of colon without hemorrhage   . Left inguinal hernia 09/16/2013  . Altered mental status 11/17/2012  . Hypothermia 11/17/2012  . Leukocytosis 11/17/2012  . CKD (chronic kidney disease), stage II   . PTSD (post-traumatic stress disorder)   . GERD (gastroesophageal reflux disease)     Past Surgical History:  Procedure Laterality Date  . APPENDECTOMY    . BACK SURGERY     x2  . BLADDER SURGERY     02  . CHOLECYSTECTOMY    . COLONOSCOPY    . COLONOSCOPY N/A 12/07/2014   Procedure: COLONOSCOPY;  Surgeon: Daneil Dolin, MD;  [facility];  Service: Endoscopy;  Laterality: N/A;  10:30 Am  . HERNIA REPAIR  rt ing  . INGUINAL HERNIA REPAIR Left 11/03/2013   Procedure: HERNIA REPAIR INGUINAL ADULT;  Surgeon: Gayland Curry, MD;  Location: [facility];  Service: General;  Laterality: Left;  . SHOULDER SURGERY     RIGHT SHOULDER        Home Medications    Prior to Admission medications   Medication Sig Start Date End Date Taking? Authorizing Provider  aspirin EC 81 MG tablet Take 81 mg by mouth at bedtime.     Historical Provider, MD  atorvastatin (LIPITOR) 40 MG tablet Take 40 mg by mouth daily. 06/10/15   Historical Provider, MD  donepezil (ARICEPT) 5 MG tablet Take 5 mg  by mouth at bedtime.    Historical Provider, MD  gabapentin (NEURONTIN) 600 MG tablet Take 600 mg by mouth 2 (two) times daily. 06/10/15   Historical Provider, MD  insulin lispro (HUMALOG) 100 UNIT/ML injection Inject 10-20 Units into the skin 3 (three) times daily before meals.     Historical Provider, MD  insulin NPH-regular Human (NOVOLIN 70/30) (70-30) 100 UNIT/ML injection Inject 70 Units into the skin at bedtime.    Historical Provider, MD  losartan (COZAAR) 50 MG tablet Take 50 mg by mouth at bedtime.     Historical Provider, MD  Tamsulosin HCl (FLOMAX) 0.4 MG CAPS Take 0.4 mg by mouth daily after supper.     Historical Provider, MD    Family History Family History  Problem Relation Age of Onset  . Heart disease Mother     before age 65    Social History Social History  Substance Use Topics  . Smoking status: Current Every Day Smoker    Packs/day: 1.00    Types: Cigarettes  . Smokeless tobacco: Never Used  . Alcohol use No     Allergies   Bee venom; Codeine; and Diovan [valsartan]   Review of Systems Review of Systems  Constitutional: Negative for chills, diaphoresis and fever.  HENT: Negative for congestion and rhinorrhea.   Eyes: Negative for visual disturbance.  Respiratory: Negative for chest tightness, shortness of breath, wheezing and stridor.   Cardiovascular: Negative for chest pain and palpitations.  Gastrointestinal: Negative for abdominal pain and vomiting.  Genitourinary: Negative for decreased urine volume, dysuria, flank pain and frequency.  Musculoskeletal: Negative for back pain.  Neurological: Positive for tremors. Negative for dizziness, seizures, syncope, speech difficulty, weakness, light-headedness and headaches.  Psychiatric/Behavioral: Negative for agitation and confusion.  All other systems reviewed and are negative.    Physical Exam Updated Vital Signs BP 114/96 (BP Location: Right Arm)   Pulse 73   Temp 97.5 F (36.4 C) (Oral)   Resp  18   Ht 6\' 1"  (1.854 m)   Wt 145 lb (65.8 kg)   SpO2 100%   BMI 19.13 kg/m   Physical Exam  Constitutional: He is oriented to person, place, and time. He appears well-developed and well-nourished. No distress.  HENT:  Head: Normocephalic and atraumatic.  Mouth/Throat: Oropharynx is clear and moist. No oropharyngeal exudate.  Eyes: Conjunctivae and EOM are normal. Pupils are equal, round, and reactive to light.  Neck: Normal range of motion. Neck supple.  Cardiovascular: Normal rate and regular rhythm.   No murmur heard. Pulmonary/Chest: Effort normal and breath sounds normal. No stridor. No respiratory distress. He has no wheezes. He exhibits no tenderness.  Abdominal: Soft. There is no tenderness.  Musculoskeletal: He exhibits no edema or tenderness.  Neurological: He is alert and oriented to person, place, and time.  He displays tremor. He displays normal reflexes. No cranial nerve deficit or sensory deficit. He exhibits normal muscle tone. Coordination normal.  Mild tremor that resolved after glucose increased.   Skin: Skin is warm and dry. He is not diaphoretic.  Psychiatric: He has a normal mood and affect.  Nursing note and vitals reviewed.    ED Treatments / Results  Labs (all labs ordered are listed, but only abnormal results are displayed) Labs Reviewed  CBC WITH DIFFERENTIAL/PLATELET - Abnormal; Notable for the following:       Result Value   RBC 3.44 (*)    Hemoglobin 10.5 (*)    HCT 31.0 (*)    All other components within normal limits  BASIC METABOLIC PANEL - Abnormal; Notable for the following:    CO2 21 (*)    GFR calc non Af Amer 59 (*)    All other components within normal limits  URINALYSIS, ROUTINE W REFLEX MICROSCOPIC (NOT AT [facility]) - Abnormal; Notable for the following:    Protein, ur 30 (*)    All other components within normal limits  URINE MICROSCOPIC-ADD ON - Abnormal; Notable for the following:    Squamous Epithelial / LPF 0-5 (*)    Bacteria, UA  MANY (*)    Casts HYALINE CASTS (*)    All other components within normal limits  CBG MONITORING, ED - Abnormal; Notable for the following:    Glucose-Capillary 42 (*)    All other components within normal limits  URINE CULTURE  CBG MONITORING, ED  CBG MONITORING, ED  CBG MONITORING, ED  CBG MONITORING, ED    EKG  EKG Interpretation None       Radiology Dg Chest 2 View  Result Date: 10/13/2015 CLINICAL DATA:  Hypoglycemia.  Chills.  Smoking history. EXAM: CHEST  2 VIEW COMPARISON:  07/09/2015 FINDINGS: Heart size is normal. Aortic atherosclerosis. Lungs are clear. No effusions. No acute bone finding. IMPRESSION: No active cardiopulmonary disease. Aortic atherosclerosis. Electronically Signed   By: Nelson Chimes M.D.   On: 10/13/2015 10:10    Procedures Procedures (including critical care time)  Medications Ordered in ED Medications - No data to display   Initial Impression / Assessment and Plan / ED Course  I have reviewed the triage vital signs and the nursing notes.  Pertinent labs & imaging results that were available during my care of the patient were reviewed by me and considered in my medical decision making (see chart for details).  Clinical Course    RAMZEY FEDAK is a 70 y.o. male with a past medical history significant for chronic disease, diabetes, GERD, hypertension, schizophrenia, prior TB who was sent from nuclear medicine after an episode of hypoglycemia. History and exam are above. Patient's only physical complaint was shakiness.Physical exam showed no focal neurologic deficits, no gait abnormalities, no numbness, tingling, or weakness. Patient alert and oriented 4. Patient denied any infectious symptoms however, given hypoglycemia, patient will be worked up for occult infection driving his hypoglycemia.  Patient given juice so that will patient will have option to continue his imaging study.  Workup results are seen above. Patient had no significant  vallecular abnormalities. CBC did not show leukocytosis. Hemoglobin slightly decreased at 10.5. Patient denied any urinary symptoms and urine was not consistent with UTI. Chest x-ray showed no pneumonia.  Patient had his glucose tract while in the ED and was treated for hypoglycemia with his juice. This was repeated after patient again became hypoglycemic.  After occult infection  ruled out, patient's symptoms likely secondary to taking his Lantus in the setting of nothing by mouth status and no breakfast. Patient had improvement in glucose after treating with juice. Radiology was contacted by nursing team and patient will have to reschedule his nuclear medicine study. As such, patient was then able to eat. Patient offered food.   Patient discharged with plans to reschedule his imaging study and follow-up with his primary team. Patient given return precautions for hypoglycemia and other modalities. Patient had no other questions or concerns and patient discharged in good condition.    Final Clinical Impressions(s) / ED Diagnoses   Final diagnoses:  Hypoglycemia    New Prescriptions New Prescriptions   No medications on file    Clinical Impression: 1. Hypoglycemia     Disposition: Discharge  Condition: Good  I have discussed the results, Dx and Tx plan with the pt(& family if present). He/she/they expressed understanding and agree(s) with the plan. Discharge instructions discussed at great length. Strict return precautions discussed and pt &/or family have verbalized understanding of the instructions. No further questions at time of discharge.    New Prescriptions   No medications on file    Follow Up: [name], MD [address] [place] [address] [phone]   If symptoms worsen, pleasde return to the nearest ED.     Gwenyth Allegra Ariyonna Twichell, MD 10/13/15 [phone]

## 2015-10-13 NOTE — ED Triage Notes (Signed)
Pt was at nuclear medicine for a nuc med study and appeared disoriented. Pt's CBG was found to be 44, pt had taken Lantus 30 units at 4 am. At 8:30, after CBG was found to be 44, he was given 8 oz of orange juice. At 8:40, CBG was 46 and then 66 at 8:45.

## 2015-10-13 NOTE — ED Notes (Signed)
Patient transported to X-ray 

## 2015-10-13 NOTE — ED Notes (Signed)
This Probation officer called nuclear medicine to find out if pt is going back for the procedure they initiated earlier. Pt will have to reschedule for the procedure . Pt will be discharged and instructed to call to reschedule.

## 2015-10-13 NOTE — ED Notes (Signed)
Pt. Is aware we need urine sample. Urinal left at bedside.

## 2015-10-13 NOTE — Significant Event (Signed)
Rapid Response Event Note  Overview:      Initial Focused Assessment:   Interventions:  Plan of Care (if not transferred):  Event Summary: RRT called to Radiology, [place], for assistance with patient with low blood sugar of 44. Upon arrival patient sitting up in chair. A/O, slow to respond to questions. Able to [facility], raise eyebrows, tongue symmetrical, smile symmetrical, bilateral grips equal. VSS: SBP 140-142/72-77, HR 80, RR 18, Sats 100% on RA. OJ 8 oz given after okayed by Radiologist. Repeated 10 minute blood sugar at the time of IV stick- CBG 46. Patient inappropriately laughed with blank stare at this RN, and Radiology tech. LAC #20 inserted by Jan, radiology tech. 15 minute repeat blood sugar 66. Radiologist called for further orders, orders received. Hanley Seamen RN, charge in ED called for room number. Patient assisted to wheelchair, unsteady gait noted. Patient taken to ED, RES A. Report given. Pt remains A/O with unsteady gait upon transfer to strectcher.   at       at          [place], [place]

## 2015-10-16 LAB — URINE CULTURE

## 2015-10-17 ENCOUNTER — Telehealth (HOSPITAL_BASED_OUTPATIENT_CLINIC_OR_DEPARTMENT_OTHER): Payer: Self-pay | Admitting: Emergency Medicine

## 2015-10-17 NOTE — Telephone Encounter (Signed)
Post ED Visit - Positive Culture Follow-up  Culture report reviewed by antimicrobial stewardship pharmacist:  []  Elenor Quinones, Pharm.D. []  Heide Guile, Pharm.D., BCPS []  Parks Neptune, Pharm.D. []  Alycia Rossetti, Pharm.D., BCPS []  [place], Pharm.D., BCPS, AAHIVP []  Legrand Como, Pharm.D., BCPS, AAHIVP []  Milus Glazier, Pharm.D. []  Stephens November, [place].D. Dimitri Ped PharmD  Positive urine culture Treated with none, asymptomatic, no further patient follow-up is required at this time.  Hazle Nordmann 10/17/2015, 10:09 AM

## 2015-10-27 ENCOUNTER — Encounter (HOSPITAL_COMMUNITY)
Admission: RE | Admit: 2015-10-27 | Discharge: 2015-10-27 | Disposition: A | Discharge status: Home / Self Care | Payer: Medicare Other | Source: Ambulatory Visit | Attending: Pulmonary Disease | Admitting: Pulmonary Disease

## 2015-10-27 DIAGNOSIS — R911 Solitary pulmonary nodule: Secondary | ICD-10-CM | POA: Diagnosis not present

## 2015-10-27 LAB — GLUCOSE, CAPILLARY: Glucose-Capillary: 84 mg/dL (ref 65–99)

## 2015-10-27 MED ORDER — FLUDEOXYGLUCOSE F - 18 (FDG) INJECTION
7.0100 | Freq: Once | INTRAVENOUS | Status: AC | PRN
  Administered 2015-10-27: 7.01 via INTRAVENOUS

## 2015-12-01 DIAGNOSIS — E119 Type 2 diabetes mellitus without complications: Secondary | ICD-10-CM | POA: Diagnosis not present

## 2015-12-01 DIAGNOSIS — Z79899 Other long term (current) drug therapy: Secondary | ICD-10-CM | POA: Diagnosis not present

## 2015-12-01 DIAGNOSIS — E785 Hyperlipidemia, unspecified: Secondary | ICD-10-CM | POA: Diagnosis not present

## 2015-12-01 DIAGNOSIS — D638 Anemia in other chronic diseases classified elsewhere: Secondary | ICD-10-CM | POA: Diagnosis not present

## 2015-12-13 DIAGNOSIS — N183 Chronic kidney disease, stage 3 (moderate): Secondary | ICD-10-CM | POA: Diagnosis not present

## 2015-12-13 DIAGNOSIS — R911 Solitary pulmonary nodule: Secondary | ICD-10-CM | POA: Diagnosis not present

## 2015-12-13 DIAGNOSIS — E1129 Type 2 diabetes mellitus with other diabetic kidney complication: Secondary | ICD-10-CM | POA: Diagnosis not present

## 2016-03-08 DIAGNOSIS — E1165 Type 2 diabetes mellitus with hyperglycemia: Secondary | ICD-10-CM | POA: Diagnosis not present

## 2016-03-08 DIAGNOSIS — E785 Hyperlipidemia, unspecified: Secondary | ICD-10-CM | POA: Diagnosis not present

## 2016-03-08 DIAGNOSIS — I10 Essential (primary) hypertension: Secondary | ICD-10-CM | POA: Diagnosis not present

## 2016-03-08 DIAGNOSIS — Z79899 Other long term (current) drug therapy: Secondary | ICD-10-CM | POA: Diagnosis not present

## 2016-03-15 DIAGNOSIS — R911 Solitary pulmonary nodule: Secondary | ICD-10-CM | POA: Diagnosis not present

## 2016-03-15 DIAGNOSIS — I700 Atherosclerosis of aorta: Secondary | ICD-10-CM | POA: Diagnosis not present

## 2016-03-15 DIAGNOSIS — E1122 Type 2 diabetes mellitus with diabetic chronic kidney disease: Secondary | ICD-10-CM | POA: Diagnosis not present

## 2016-03-15 DIAGNOSIS — N183 Chronic kidney disease, stage 3 (moderate): Secondary | ICD-10-CM | POA: Diagnosis not present

## 2016-03-20 ENCOUNTER — Other Ambulatory Visit (HOSPITAL_COMMUNITY): Payer: Self-pay | Admitting: Internal Medicine

## 2016-03-20 DIAGNOSIS — R911 Solitary pulmonary nodule: Secondary | ICD-10-CM

## 2016-03-30 ENCOUNTER — Ambulatory Visit (HOSPITAL_COMMUNITY)
Admission: RE | Admit: 2016-03-30 | Discharge: 2016-03-30 | Disposition: A | Discharge status: Home / Self Care | Payer: Medicare Other | Source: Ambulatory Visit | Attending: Internal Medicine | Admitting: Internal Medicine

## 2016-03-30 DIAGNOSIS — J439 Emphysema, unspecified: Secondary | ICD-10-CM | POA: Diagnosis not present

## 2016-03-30 DIAGNOSIS — R911 Solitary pulmonary nodule: Secondary | ICD-10-CM | POA: Diagnosis not present

## 2016-03-30 DIAGNOSIS — R918 Other nonspecific abnormal finding of lung field: Secondary | ICD-10-CM | POA: Diagnosis not present

## 2016-03-30 LAB — POCT I-STAT CREATININE: Creatinine, Ser: 1.2 mg/dL (ref 0.61–1.24)

## 2016-03-30 MED ORDER — IOPAMIDOL (ISOVUE-300) INJECTION 61%
75.0000 mL | Freq: Once | INTRAVENOUS | Status: AC | PRN
  Administered 2016-03-30: 75 mL via INTRAVENOUS

## 2016-04-10 ENCOUNTER — Encounter: Payer: Self-pay | Admitting: *Deleted

## 2016-04-10 ENCOUNTER — Institutional Professional Consult (permissible substitution) (INDEPENDENT_AMBULATORY_CARE_PROVIDER_SITE_OTHER): Payer: Medicare Other | Admitting: Thoracic Surgery (Cardiothoracic Vascular Surgery)

## 2016-04-10 VITALS — BP 119/69 | HR 87 | Resp 20 | Ht 73.0 in | Wt 160.0 lb

## 2016-04-10 DIAGNOSIS — R911 Solitary pulmonary nodule: Secondary | ICD-10-CM

## 2016-04-10 DIAGNOSIS — R590 Localized enlarged lymph nodes: Secondary | ICD-10-CM | POA: Insufficient documentation

## 2016-04-10 NOTE — Progress Notes (Signed)
[address]       [place] [address]             [phone]    HPI: 71 yo man sent for consultation regarding a right lung nodule and abnormal PET CT  Allen Yu is a 71 year old man with a past medical history significant for tuberculosis, ongoing tobacco abuse, emphysema, postemetic stress disorder, schizophrenia, hypertension, stage II chronic kidney disease, gastroesophageal reflux, peripheral neuropathy and multiple back surgeries.  He had a PET/CT in October 2017. I do not have any studies prior to that indicate why the PET CT was done. It showed a 6 x 3 mm spiculated nodule density in the right apex with SUV of 1.27. Per the report it had previously measured 10 x 7 mm. He had multiple normal sized lymph nodes that had avid FDG uptake. He recently had a CT chest which showed a slight decrease in size of the right lung nodule. There was no change in the size of the lymph nodes.  Review of systems :  He denies any recent pulmonary symptoms such as cough or shortness of breath. He says he was treated for tuberculosis about 15 years ago. He does have peripheral neuropathy which he says is from [employer] although he has had 5 back surgeries. He also has poor circulation in his legs and feet. He has not had any fevers but has had some night sweats. He says he has lost 30 pounds over the past 6 months. His appetite is poor. He denies difficulty swallowing or dysphagia.  Past Medical History:  Diagnosis Date  . Anemia   . Arthritis   . Carotid stenosis   . Chronic back pain   . Chronic kidney disease    stones hx  . Diabetes mellitus   . GERD (gastroesophageal reflux disease)   . Hypertension   . Lung nodule   . Peripheral neuropathy ([place])   . PTSD (post-traumatic stress disorder)   . Schizophrenia ([place])   . Tuberculosis    MED TX X 1 YEAR YEARS AGO     Past Surgical History:  Procedure Laterality Date  . APPENDECTOMY    . BACK SURGERY  2000, 2013   x2  . BLADDER SURGERY     02  . CHOLECYSTECTOMY    . COLONOSCOPY    . COLONOSCOPY N/A 12/07/2014   Procedure: COLONOSCOPY;  Surgeon: Daneil Dolin, MD;  [facility];  Service: Endoscopy;  Laterality: N/A;  10:30 Am  . HERNIA REPAIR Right   . INGUINAL HERNIA REPAIR Left 11/03/2013   Procedure: HERNIA REPAIR INGUINAL ADULT;  Surgeon: Gayland Curry, MD;  Location: [facility];  Service: General;  Laterality: Left;  . SHOULDER SURGERY     RIGHT SHOULDER    Social History   Social History  . Marital status: Married    Spouse name: N/A  . Number of children: N/A  . Years of education: N/A   Occupational History  . Not on file.   Social History Main Topics  . Smoking status: Current Every Day Smoker    Packs/day: 1.00    Types: Cigarettes  . Smokeless tobacco: Never Used  . Alcohol use No  . Drug use: Yes    Types: Marijuana     Comment: occasional use of marijuana   . Sexual activity: No   Other Topics Concern  . Not on file   Social History Narrative  . No  narrative on file     Current Outpatient Prescriptions  Medication Sig Dispense Refill  . aspirin EC 81 MG tablet Take 81 mg by mouth at bedtime.     [name] atorvastatin (LIPITOR) 40 MG tablet Take 40 mg by mouth daily.  4  . donepezil (ARICEPT) 5 MG tablet Take 5 mg by mouth at bedtime.    . gabapentin (NEURONTIN) 600 MG tablet Take 600 mg by mouth 2 (two) times daily.  4  . insulin lispro (HUMALOG) 100 UNIT/ML injection Inject 10-20 Units into the skin 3 (three) times daily before meals.     . insulin NPH-regular Human (NOVOLIN 70/30) (70-30) 100 UNIT/ML injection Inject 70 Units into the skin at bedtime.    [name] losartan (COZAAR) 50 MG tablet Take 50 mg by mouth at bedtime.     . Tamsulosin HCl (FLOMAX) 0.4 MG CAPS Take 0.4 mg by mouth daily after supper.      No current facility-administered medications for this visit.    Facility-Administered Medications Ordered in Other Visits  Medication Dose Route  Frequency Provider Last Rate Last Dose  . ondansetron (ZOFRAN) 4 mg in sodium chloride 0.9 % 50 mL IVPB  4 mg Intravenous Once Ashok Pall, MD        Physical Exam BP 119/69   Pulse 87   Resp 20   Ht 6\' 1"  (1.854 m)   Wt 160 lb (72.6 kg)   SpO2 99% Comment: RA  BMI 21.43 kg/m  71 year old man in no acute distress Oriented 3 with no focal motor deficits Neck supple without thyromegaly or cervical or supraclavicular adenopathy Cardiac regular rate and rhythm and S2 no rubs or murmurs Lungs with diminished breath sounds bilaterally no rales or wheezing No peripheral edema  Diagnostic Tests: NUCLEAR MEDICINE PET SKULL BASE TO THIGH  TECHNIQUE: 7.01 mCi F-18 FDG was injected intravenously. Full-ring PET imaging was performed from the skull base to thigh after the radiotracer. CT data was obtained and used for attenuation correction and anatomic localization.  FASTING BLOOD GLUCOSE:  Value: 84 mg/dl  COMPARISON:  None.  FINDINGS: NECK  No hypermetabolic lymph nodes in the neck.  CHEST  Within the anterior mediastinum there is a small pre-vascular node measuring 6 mm within SUV max equal to 6.73, image 61 of series 4. No suspicious pulmonary nodules on the CT scan. Sub cm left hilar lymph node has an SUV max equal to 6.01.  Moderate to advanced smoking related changes within both lungs including bronchial wall thickening centrilobular emphysema. The right apical spiculated nodular density appears decreased from previous exam. Currently this measures 6 x 3 mm and has an SUV max equal to 1.27. Previously this measured 10 x 7 mm.  ABDOMEN/PELVIS  No abnormal hypermetabolic activity within the liver, pancreas, adrenal glands, or spleen. Aortic atherosclerosis. Infrarenal abdominal aorta measures up to 2.6 cm, image 126 of series 4. No hypermetabolic lymph nodes in the abdomen or pelvis. Two hypermetabolic nodules are identified within the left side  of prostate gland with SUV max of 12.57.  SKELETON  No focal hypermetabolic activity to suggest skeletal metastasis.  IMPRESSION: 1. There has been decrease in size of spiculated nodule in the right upper lobe. There is non malignant range FDG uptake associated with this nodule. Findings favor a benign inflammatory or infectious process. 2. Small prevascular and left hilar lymph nodes exhibit malignant range FDG uptake. These nodules are likely too small for tissue sampling. For this reason, a follow-up examination in  3 months is recommended to confirm stability of these tiny lymph nodes. At this time a contrast enhanced CT of the chest would be advised. The spiculated nodular density in the right upper lobe can be re-evaluated at that time as well. 3. Aortic atherosclerosis and multi vessel coronary artery calcification. 4. Ectatic abdominal aorta at risk for aneurysm development. Recommend followup by ultrasound in 5 years. This recommendation follows ACR consensus guidelines: White Paper of the ACR Incidental Findings Committee II on Vascular Findings. J Am Coll Radiol 2013; 10:789-794.   Electronically Signed   By: Kerby Moors M.D.   On: 10/27/2015 11:27 CT CHEST WITH CONTRAST  TECHNIQUE: Multidetector CT imaging of the chest was performed during intravenous contrast administration.  CONTRAST:  85mL ISOVUE-300 IOPAMIDOL (ISOVUE-300) INJECTION 61%  COMPARISON:  10/27/2015, 08/15/2004  FINDINGS: Cardiovascular: Non aneurysmal aorta. Left-sided arch with normal 3 vessel origin. Atherosclerotic vascular calcifications. Coronary artery calcifications. Heart size within normal limits. No large pericardial effusion.  Mediastinum/Nodes: 6 mm prevascular lymph node for which follow-up was recommended is stable in size. A left subcentimeter hilar node does not appear significantly changed. There are other mediastinal lymph nodes which do not appear  significantly changed, for example a right paratracheal lymph node measures 6 mm. AP window lymph node measures 8 mm compared with 8 mm previously. There are no enlarging lymph nodes visualized.  The trachea is midline.  The esophagus is grossly unremarkable.  Lungs/Pleura: Moderate emphysematous disease. Spiculated density in the apical portion of the right upper lobe has slightly decreased, measuring 5 x 3 mm, previously 6 x 3 mm. Minimal architectural distortion in the region. No acute infiltrate or effusion. No new pulmonary nodules.  Upper Abdomen: Surgical clips in the gallbladder fossa. Hepatic steatosis. 1.9 cm cyst mid to upper left kidney.  Musculoskeletal: Partially visualized surgical plate and screw fixation of the lower cervical spine. No acute or suspicious bone lesion.  IMPRESSION: 1. Slight further decrease of spiculated density in the right upper lobe. There are no new pulmonary nodules visualized. 2. Moderate emphysematous disease 3. No significant interval change in subcentimeter mediastinal lymph nodes compared to the prior PET-CT.   Electronically Signed   By: Donavan Foil M.D.   On: 03/30/2016 15:03 I personally reviewed the PET/CT from October 2017 as well as his recent CT scan. I concur with the findings noted above  Impression: Rhett is a 72 year old man with a history of tobacco abuse and tuberculosis he was found in October 2017 to have a small right upper lobe lung nodule. This was not markedly hypermetabolic on PET/CT (and was smaller on the PET that had been on previous scan which is not available to me). There has been no interval growth of that nodule in the past 6 months and in fact it might be slightly smaller in size although any changes minimal. I see no indication for intervention on that nodule at this time, but given his history of tobacco abuse requires continued follow-up.  His PET in October showed some metabolic uptake in left  hilar and mediastinal lymph nodes. These were normal in size at that time. They have not changed in size over the 6 month interval. I doubt they represent malignancy is a suspected there would be some growth in that period of time. The nodes are too small to attempt to biopsy at this point. Again I think these need to be observed.  Tobacco abuse- Smoking cessation instruction/counseling given:  counseled patient on the dangers of  tobacco use, advised patient to stop smoking, and reviewed strategies to maximize success   Plan: I recommended that he return in 3 months with CT chest  Melrose Nakayama, MD [facility] [phone]

## 2016-06-07 ENCOUNTER — Other Ambulatory Visit: Payer: Self-pay | Admitting: *Deleted

## 2016-06-07 DIAGNOSIS — R911 Solitary pulmonary nodule: Secondary | ICD-10-CM

## 2016-06-12 DIAGNOSIS — F209 Schizophrenia, unspecified: Secondary | ICD-10-CM | POA: Diagnosis not present

## 2016-06-12 DIAGNOSIS — E1165 Type 2 diabetes mellitus with hyperglycemia: Secondary | ICD-10-CM | POA: Diagnosis not present

## 2016-06-12 DIAGNOSIS — N183 Chronic kidney disease, stage 3 (moderate): Secondary | ICD-10-CM | POA: Diagnosis not present

## 2016-06-12 DIAGNOSIS — F0390 Unspecified dementia without behavioral disturbance: Secondary | ICD-10-CM | POA: Diagnosis not present

## 2016-06-12 DIAGNOSIS — E785 Hyperlipidemia, unspecified: Secondary | ICD-10-CM | POA: Diagnosis not present

## 2016-06-12 DIAGNOSIS — Z79899 Other long term (current) drug therapy: Secondary | ICD-10-CM | POA: Diagnosis not present

## 2016-06-12 DIAGNOSIS — I10 Essential (primary) hypertension: Secondary | ICD-10-CM | POA: Diagnosis not present

## 2016-06-28 DIAGNOSIS — E1122 Type 2 diabetes mellitus with diabetic chronic kidney disease: Secondary | ICD-10-CM | POA: Diagnosis not present

## 2016-06-28 DIAGNOSIS — N183 Chronic kidney disease, stage 3 (moderate): Secondary | ICD-10-CM | POA: Diagnosis not present

## 2016-06-28 DIAGNOSIS — R911 Solitary pulmonary nodule: Secondary | ICD-10-CM | POA: Diagnosis not present

## 2016-06-28 DIAGNOSIS — Z6821 Body mass index (BMI) 21.0-21.9, adult: Secondary | ICD-10-CM | POA: Diagnosis not present

## 2016-07-03 ENCOUNTER — Ambulatory Visit: Payer: Medicare Other | Admitting: Thoracic Surgery (Cardiothoracic Vascular Surgery)

## 2016-07-03 ENCOUNTER — Other Ambulatory Visit: Payer: Medicare Other

## 2016-07-09 ENCOUNTER — Emergency Department (HOSPITAL_COMMUNITY): Payer: Medicare Other

## 2016-07-09 ENCOUNTER — Encounter (HOSPITAL_COMMUNITY): Payer: Self-pay | Admitting: *Deleted

## 2016-07-09 ENCOUNTER — Emergency Department (HOSPITAL_COMMUNITY)
Admission: EM | Admit: 2016-07-09 | Discharge: 2016-07-09 | Disposition: A | Discharge status: Home / Self Care | Payer: Medicare Other | Attending: Emergency Medicine | Admitting: Emergency Medicine

## 2016-07-09 DIAGNOSIS — Z79899 Other long term (current) drug therapy: Secondary | ICD-10-CM | POA: Diagnosis not present

## 2016-07-09 DIAGNOSIS — I129 Hypertensive chronic kidney disease with stage 1 through stage 4 chronic kidney disease, or unspecified chronic kidney disease: Secondary | ICD-10-CM | POA: Insufficient documentation

## 2016-07-09 DIAGNOSIS — N182 Chronic kidney disease, stage 2 (mild): Secondary | ICD-10-CM | POA: Insufficient documentation

## 2016-07-09 DIAGNOSIS — R17 Unspecified jaundice: Secondary | ICD-10-CM | POA: Diagnosis not present

## 2016-07-09 DIAGNOSIS — Z7982 Long term (current) use of aspirin: Secondary | ICD-10-CM | POA: Diagnosis not present

## 2016-07-09 DIAGNOSIS — R109 Unspecified abdominal pain: Secondary | ICD-10-CM

## 2016-07-09 DIAGNOSIS — N189 Chronic kidney disease, unspecified: Secondary | ICD-10-CM

## 2016-07-09 DIAGNOSIS — R1011 Right upper quadrant pain: Secondary | ICD-10-CM | POA: Diagnosis not present

## 2016-07-09 DIAGNOSIS — Z794 Long term (current) use of insulin: Secondary | ICD-10-CM | POA: Diagnosis not present

## 2016-07-09 DIAGNOSIS — E1122 Type 2 diabetes mellitus with diabetic chronic kidney disease: Secondary | ICD-10-CM | POA: Diagnosis not present

## 2016-07-09 DIAGNOSIS — F17210 Nicotine dependence, cigarettes, uncomplicated: Secondary | ICD-10-CM | POA: Diagnosis not present

## 2016-07-09 DIAGNOSIS — R1084 Generalized abdominal pain: Secondary | ICD-10-CM | POA: Diagnosis not present

## 2016-07-09 LAB — COMPREHENSIVE METABOLIC PANEL
ALBUMIN: 5.1 g/dL — AB (ref 3.5–5.0)
ALK PHOS: 116 U/L (ref 38–126)
ALT: 27 U/L (ref 17–63)
AST: 45 U/L — AB (ref 15–41)
Anion gap: 13 (ref 5–15)
BILIRUBIN TOTAL: 2 mg/dL — AB (ref 0.3–1.2)
BUN: 22 mg/dL — AB (ref 6–20)
CALCIUM: 10.4 mg/dL — AB (ref 8.9–10.3)
CO2: 20 mmol/L — AB (ref 22–32)
Chloride: 106 mmol/L (ref 101–111)
Creatinine, Ser: 1.99 mg/dL — ABNORMAL HIGH (ref 0.61–1.24)
GFR calc Af Amer: 37 mL/min — ABNORMAL LOW (ref >60)
GFR calc non Af Amer: 32 mL/min — ABNORMAL LOW (ref >60)
GLUCOSE: 107 mg/dL — AB (ref 65–99)
POTASSIUM: 4.1 mmol/L (ref 3.5–5.1)
Sodium: 139 mmol/L (ref 135–145)
TOTAL PROTEIN: 8.4 g/dL — AB (ref 6.5–8.1)

## 2016-07-09 LAB — URINALYSIS, ROUTINE W REFLEX MICROSCOPIC
BACTERIA UA: NONE SEEN
Bilirubin Urine: NEGATIVE
Glucose, UA: NEGATIVE mg/dL
Ketones, ur: 20 mg/dL — AB
Leukocytes, UA: NEGATIVE
NITRITE: NEGATIVE
PROTEIN: 30 mg/dL — AB
SPECIFIC GRAVITY, URINE: 1.013 (ref 1.005–1.030)
Squamous Epithelial / LPF: NONE SEEN
pH: 5 (ref 5.0–8.0)

## 2016-07-09 LAB — CBC
HEMATOCRIT: 36.4 % — AB (ref 39.0–52.0)
Hemoglobin: 12.4 g/dL — ABNORMAL LOW (ref 13.0–17.0)
MCH: 31.4 pg (ref 26.0–34.0)
MCHC: 34.1 g/dL (ref 30.0–36.0)
MCV: 92.2 fL (ref 78.0–100.0)
Platelets: 262 10*3/uL (ref 150–400)
RBC: 3.95 MIL/uL — ABNORMAL LOW (ref 4.22–5.81)
RDW: 14.1 % (ref 11.5–15.5)
WBC: 9.9 10*3/uL (ref 4.0–10.5)

## 2016-07-09 LAB — TROPONIN I

## 2016-07-09 LAB — LIPASE, BLOOD: Lipase: 48 U/L (ref 11–51)

## 2016-07-09 MED ORDER — MORPHINE SULFATE (PF) 2 MG/ML IV SOLN
2.0000 mg | Freq: Once | INTRAVENOUS | Status: AC
  Administered 2016-07-09: 2 mg via INTRAVENOUS
  Filled 2016-07-09: qty 1

## 2016-07-09 MED ORDER — ONDANSETRON HCL 4 MG/2ML IJ SOLN
4.0000 mg | Freq: Once | INTRAMUSCULAR | Status: AC
  Administered 2016-07-09: 4 mg via INTRAVENOUS
  Filled 2016-07-09: qty 2

## 2016-07-09 MED ORDER — SODIUM CHLORIDE 0.9 % IV BOLUS (SEPSIS)
1000.0000 mL | Freq: Once | INTRAVENOUS | Status: AC
  Administered 2016-07-09: 1000 mL via INTRAVENOUS

## 2016-07-09 MED ORDER — ONDANSETRON HCL 4 MG PO TABS: 4.0000 mg | ORAL_TABLET | Freq: Four times a day (QID) | ORAL | 0 refills | Status: DC

## 2016-07-09 MED ORDER — IOPAMIDOL (ISOVUE-300) INJECTION 61%
INTRAVENOUS | Status: AC
  Administered 2016-07-09: 80 mL
  Filled 2016-07-09: qty 30

## 2016-07-09 NOTE — Discharge Instructions (Signed)
Clear liquids for the next several hours. Prescription for nausea medication. Your bilirubin is slightly elevated. Additionally, your kidney function is also slightly elevated.  Follow up with your primary care doctor.

## 2016-07-09 NOTE — ED Notes (Signed)
Pt grabs left chest when saying he is "just cramping". EKG completed.

## 2016-07-09 NOTE — ED Triage Notes (Signed)
Pt comes in with abdominal pain starting 3 days ago. He has n/v/d as well. Pt points to cramping in his legs.

## 2016-07-09 NOTE — ED Notes (Signed)
Patient resting on stretcher. Given warm blanket. No other needs at this time.

## 2016-07-09 NOTE — ED Provider Notes (Signed)
[facility] DEPT Provider Note   CSN: [id number] Arrival date & time: 07/09/16  6073     History   Chief Complaint Chief Complaint  Patient presents with  . Abdominal Pain    HPI Allen Yu is a 71 y.o. male.  Patient presents with generalized abdominal cramping and diarrhea for several days. Stool is watery without blood. No fever, sweats, chills, dysuria, vomiting. Severity of symptoms is mild to moderate. Nothing makes symptoms better or worse.      Past Medical History:  Diagnosis Date  . Anemia   . Arthritis   . Carotid stenosis   . Chronic back pain   . Chronic kidney disease    stones hx  . Diabetes mellitus   . GERD (gastroesophageal reflux disease)   . Hypertension   . Lung nodule   . Peripheral neuropathy   . PTSD (post-traumatic stress disorder)   . Schizophrenia ([place])   . Tuberculosis    MED TX X 1 YEAR YEARS AGO     Patient Active Problem List   Diagnosis Date Noted  . Hilar adenopathy 04/10/2016  . Lung nodule seen on imaging study 07/09/2015  . Acute encephalopathy   . Hypoglycemia due to insulin   . Hypertension 07/08/2015  . Insulin dependent diabetes mellitus ([place]) 07/08/2015  . AKI (acute kidney injury) ([place]) 07/08/2015  . Normocytic anemia 07/08/2015  . Tobacco abuse 07/08/2015  . Diverticulosis of colon without hemorrhage   . Left inguinal hernia 09/16/2013  . Altered mental status 11/17/2012  . Hypothermia 11/17/2012  . Leukocytosis 11/17/2012  . CKD (chronic kidney disease), stage II   . PTSD (post-traumatic stress disorder)   . GERD (gastroesophageal reflux disease)     Past Surgical History:  Procedure Laterality Date  . APPENDECTOMY    . BACK SURGERY  2000, 2013   x2  . BLADDER SURGERY     02  . CHOLECYSTECTOMY    . COLONOSCOPY    . COLONOSCOPY N/A 12/07/2014   Procedure: COLONOSCOPY;  Surgeon: Daneil Dolin, MD;  [facility];  Service: Endoscopy;  Laterality: N/A;  10:30 Am  . HERNIA REPAIR Right    . INGUINAL HERNIA REPAIR Left 11/03/2013   Procedure: HERNIA REPAIR INGUINAL ADULT;  Surgeon: Gayland Curry, MD;  Location: [facility];  Service: General;  Laterality: Left;  . SHOULDER SURGERY     RIGHT SHOULDER        Home Medications    Prior to Admission medications   Medication Sig Start Date End Date Taking? Authorizing Provider  aspirin EC 81 MG tablet Take 81 mg by mouth at bedtime.    Yes [provider]  atorvastatin (LIPITOR) 40 MG tablet Take 40 mg by mouth daily. 06/10/15  Yes [provider]  donepezil (ARICEPT) 10 MG tablet Take 15 mg by mouth daily.  06/28/16  Yes [provider]  gabapentin (NEURONTIN) 600 MG tablet Take 600 mg by mouth 2 (two) times daily. 06/10/15  Yes [provider]  LANTUS 100 UNIT/ML injection Inject 75 Units into the skin daily.  06/09/16  Yes [provider]  losartan (COZAAR) 50 MG tablet Take 50 mg by mouth at bedtime.    Yes [provider]  omeprazole (PRILOSEC) 20 MG capsule Take 20 mg by mouth daily.   Yes [provider]  sertraline (ZOLOFT) 50 MG tablet Take 50 mg by mouth daily.   Yes [provider]  Tamsulosin HCl (FLOMAX) 0.4 MG CAPS  Take 0.4 mg by mouth daily after supper.    Yes [provider]  insulin lispro (HUMALOG) 100 UNIT/ML injection Inject 10-20 Units into the skin 3 (three) times daily before meals.     [provider]  ondansetron (ZOFRAN) 4 MG tablet Take 1 tablet (4 mg total) by mouth every 6 (six) hours. 07/09/16   Nat Christen, MD    Family History Family History  Problem Relation Age of Onset  . Heart disease Mother        before age 39    Social History Social History  Substance Use Topics  . Smoking status: Current Every Day Smoker    Packs/day: 1.00    Types: Cigarettes  . Smokeless tobacco: Never Used  . Alcohol use No     Allergies   Bee venom; Codeine; Diovan [valsartan]; and Propoxyphene   Review of  Systems Review of Systems  All other systems reviewed and are negative.    Physical Exam Updated Vital Signs BP (!) 161/78   Pulse 81   Temp 97.5 F (36.4 C) (Oral)   Resp 13   Ht 6\' 1"  (1.854 m)   Wt 68 kg (150 lb)   SpO2 100%   BMI 19.79 kg/m   Physical Exam  Constitutional: He is oriented to person, place, and time. He appears well-developed and well-nourished.  HENT:  Head: Normocephalic and atraumatic.  Eyes: Conjunctivae are normal.  Neck: Neck supple.  Cardiovascular: Normal rate and regular rhythm.   Pulmonary/Chest: Effort normal and breath sounds normal.  Abdominal:  Minimal generalized tenderness, but no acute abdomen.  Musculoskeletal: Normal range of motion.  Neurological: He is alert and oriented to person, place, and time.  Skin: Skin is warm and dry.  Psychiatric: He has a normal mood and affect. His behavior is normal.  Nursing note and vitals reviewed.    ED Treatments / Results  Labs (all labs ordered are listed, but only abnormal results are displayed) Labs Reviewed  COMPREHENSIVE METABOLIC PANEL - Abnormal; Notable for the following:       Result Value   CO2 20 (*)    Glucose, Bld 107 (*)    BUN 22 (*)    Creatinine, Ser 1.99 (*)    Calcium 10.4 (*)    Total Protein 8.4 (*)    Albumin 5.1 (*)    AST 45 (*)    Total Bilirubin 2.0 (*)    GFR calc non Af Amer 32 (*)    GFR calc Af Amer 37 (*)    All other components within normal limits  CBC - Abnormal; Notable for the following:    RBC 3.95 (*)    Hemoglobin 12.4 (*)    HCT 36.4 (*)    All other components within normal limits  URINALYSIS, ROUTINE W REFLEX MICROSCOPIC - Abnormal; Notable for the following:    Hgb urine dipstick SMALL (*)    Ketones, ur 20 (*)    Protein, ur 30 (*)    All other components within normal limits  LIPASE, BLOOD  TROPONIN I    EKG  EKG Interpretation  Date/Time:  Monday July 09 2016 07:47:27 EDT Ventricular Rate:  97 PR Interval:    QRS  Duration: 89 QT Interval:  354 QTC Calculation: 450 R Axis:   62 Text Interpretation:  Sinus rhythm Low voltage, extremity leads Baseline wander in lead(s) V1 V6 Confirmed by Nat Christen [phone]) on 07/09/2016 10:11:34 AM       Radiology  Ct Abdomen Pelvis W Contrast  Result Date: 07/09/2016 CLINICAL DATA:  Right upper quadrant pain EXAM: CT ABDOMEN AND PELVIS WITH CONTRAST TECHNIQUE: Multidetector CT imaging of the abdomen and pelvis was performed using the standard protocol following bolus administration of intravenous contrast. CONTRAST:  79mL ISOVUE-300 IOPAMIDOL (ISOVUE-300) INJECTION 61% COMPARISON:  None. FINDINGS: Lower chest: Lung bases are free of acute infiltrate. Hepatobiliary: No focal liver abnormality is seen. Status post cholecystectomy. No biliary dilatation. Pancreas: Unremarkable. No pancreatic ductal dilatation or surrounding inflammatory changes. Spleen: Normal in size without focal abnormality. Adrenals/Urinary Tract: The adrenal glands are within normal limits. The kidneys are well visualized bilaterally. Tiny nonobstructing renal stone is noted on the right. Some cystic change on the left is noted. No obstructive changes are seen. The bladder is partially distended. Stomach/Bowel: The appendix has been surgically removed. No obstructive or inflammatory changes are noted. Vascular/Lymphatic: Diffuse vascular calcifications are noted. No significant lymphadenopathy is seen. Reproductive: Prostate is unremarkable. Other: No abdominal wall hernia or abnormality. No abdominopelvic ascites. Musculoskeletal: Postsurgical changes are noted at L4-5 and L5-S1 IMPRESSION: Tiny nonobstructing renal stone on the right. Chronic changes as described above. Electronically Signed   By: Inez Catalina M.D.   On: 07/09/2016 14:32    Procedures Procedures (including critical care time)  Medications Ordered in ED Medications  ondansetron (ZOFRAN) injection 4 mg (4 mg Intravenous Given 07/09/16 0822)   sodium chloride 0.9 % bolus 1,000 mL (0 mLs Intravenous Stopped 07/09/16 0959)  morphine 2 MG/ML injection 2 mg (2 mg Intravenous Given 07/09/16 0822)  sodium chloride 0.9 % bolus 1,000 mL (1,000 mLs Intravenous New Bag/Given 07/09/16 0822)  iopamidol (ISOVUE-300) 61 % injection (80 mLs  Contrast Given 07/09/16 1215)     Initial Impression / Assessment and Plan / ED Course  I have reviewed the triage vital signs and the nursing notes.  Pertinent labs & imaging results that were available during my care of the patient were reviewed by me and considered in my medical decision making (see chart for details).     Patient is nontoxic-appearing. Bilirubin and creatinine slightly elevated. CT scan reveals no acute pathology. Patient feels better after IV fluids, IV Zofran, IV morphine. He has primary care follow-up.  Final Clinical Impressions(s) / ED Diagnoses   Final diagnoses:  Abdominal pain, unspecified abdominal location  Elevated bilirubin  Chronic kidney disease, unspecified CKD stage    New Prescriptions New Prescriptions   ONDANSETRON (ZOFRAN) 4 MG TABLET    Take 1 tablet (4 mg total) by mouth every 6 (six) hours.     Nat Christen, MD 07/11/16 [phone]

## 2016-07-31 ENCOUNTER — Encounter: Payer: Self-pay | Admitting: Thoracic Surgery (Cardiothoracic Vascular Surgery)

## 2016-07-31 ENCOUNTER — Ambulatory Visit (INDEPENDENT_AMBULATORY_CARE_PROVIDER_SITE_OTHER): Payer: Medicare Other | Admitting: Thoracic Surgery (Cardiothoracic Vascular Surgery)

## 2016-07-31 ENCOUNTER — Ambulatory Visit
Admission: RE | Admit: 2016-07-31 | Discharge: 2016-07-31 | Disposition: A | Discharge status: Home / Self Care | Payer: Medicare Other | Source: Ambulatory Visit | Attending: Thoracic Surgery (Cardiothoracic Vascular Surgery) | Admitting: Thoracic Surgery (Cardiothoracic Vascular Surgery)

## 2016-07-31 VITALS — BP 155/72 | HR 79 | Resp 20 | Ht 73.0 in | Wt 149.0 lb

## 2016-07-31 DIAGNOSIS — R911 Solitary pulmonary nodule: Secondary | ICD-10-CM | POA: Diagnosis not present

## 2016-07-31 NOTE — Progress Notes (Signed)
[address]       [place] [address]             [phone]    HPI: Allen Yu returns for follow-up of a lung nodule  Allen Yu is a 71 year old man with past medical history of tuberculosis, ongoing tobacco abuse, emphysema, post traumatic stress disorder, schizophrenia, hypertension, stage II chronic kidney disease, multiple back surgeries, peripheral neuropathy, and reflux. Allen Yu had a PET/CT in October 2017 which showed a 6 mm spiculated nodule in the right apex with an SUV of 1.27. Allen Yu had a CT of the chest prior to that, to which I don't have access. I reported the nodule had been 10 x 7 mm on the CT. I have been following Allen Yu since then with periodic scans. I saw Allen Yu in March 2018 the nodule was stable to slightly decreased in size.  There were also multiple hypermetabolic lymph nodes that were normal in size on the PET CT scan.  Allen Yu says his been doing well since his last visit. Allen Yu continues to smoke cigarettes. Allen Yu is not interested in quitting. Allen Yu denies cough or shortness of breath. Allen Yu still has problems with pain in his hands and feet due to peripheral neuropathy. Allen Yu attributes that to agent orange. Of note Allen Yu has had 5 back surgeries as well. His weight has been stable since his last visit.  Past Medical History:  Diagnosis Date  . Anemia   . Arthritis   . Carotid stenosis   . Chronic back pain   . Chronic kidney disease    stones hx  . Diabetes mellitus   . GERD (gastroesophageal reflux disease)   . Hypertension   . Lung nodule   . Peripheral neuropathy   . PTSD (post-traumatic stress disorder)   . Schizophrenia ([place])   . Tuberculosis    MED TX X 1 YEAR YEARS AGO     Current Outpatient Prescriptions  Medication Sig Dispense Refill  . aspirin EC 81 MG tablet Take 81 mg by mouth at bedtime.     [name] atorvastatin (LIPITOR) 40 MG tablet Take 40 mg by mouth daily.  4  . donepezil (ARICEPT) 10 MG tablet Take 15 mg by mouth daily.     [name] gabapentin (NEURONTIN)  600 MG tablet Take 600 mg by mouth 2 (two) times daily.  4  . insulin lispro (HUMALOG) 100 UNIT/ML injection Inject 10-20 Units into the skin 3 (three) times daily before meals.     [name] LANTUS 100 UNIT/ML injection Inject 75 Units into the skin daily.     [name] losartan (COZAAR) 50 MG tablet Take 50 mg by mouth at bedtime.     [name] omeprazole (PRILOSEC) 20 MG capsule Take 20 mg by mouth daily.    . ondansetron (ZOFRAN) 4 MG tablet Take 1 tablet (4 mg total) by mouth every 6 (six) hours. 10 tablet 0  . sertraline (ZOLOFT) 50 MG tablet Take 50 mg by mouth daily.    . Tamsulosin HCl (FLOMAX) 0.4 MG CAPS Take 0.4 mg by mouth daily after supper.      No current facility-administered medications for this visit.    Facility-Administered Medications Ordered in Other Visits  Medication Dose Route Frequency Provider Last Rate Last Dose  . ondansetron (ZOFRAN) 4 mg in sodium chloride 0.9 % 50 mL IVPB  4 mg Intravenous Once Ashok Pall, MD        Physical Exam BP (!) 155/Allen   Pulse 79  Resp 20   Ht 6\' 1"  (1.854 m)   Wt 149 lb (67.6 kg)   SpO2 99% Comment: RA  BMI 19.66 kg/m  Allen Yu in no acute distress Alert and oriented 3 No cervical or supraclavicular adenopathy Lungs diminished but equal breath sounds bilaterally Cardiac regular rate and rhythm  Diagnostic Tests: CT CHEST WITHOUT CONTRAST  TECHNIQUE: Multidetector CT imaging of the chest was performed following the standard protocol without IV contrast.  COMPARISON:  Chest CT dated 03/30/2016.  Head CT dated 10/27/2015.  FINDINGS: Cardiovascular: Heart size is normal. No pericardial effusion. Aortic atherosclerosis. No aortic aneurysm.  Mediastinum/Nodes: No mass or enlarged lymph nodes within the mediastinum or perihilar regions. Esophagus is unremarkable. Trachea and central bronchi are unremarkable.  Lungs/Pleura: Stable irregular nodule within the right lung apex measuring 6 x 3 mm. Stable emphysema. No new lung  findings.  Upper Abdomen: No acute findings.  Status post cholecystectomy.  Musculoskeletal: No acute or suspicious osseous finding. Superficial soft tissues are unremarkable.  IMPRESSION: 1. Irregular nodule within the right lung apex is stable compared to the most recent chest CT of 03/30/2016 and PET-CT of 10/27/2015, measuring 6 x 3 mm. By report, this nodule previously measured up to 10 mm greatest dimension. Recommend additional follow-up chest CT in 12 months to ensure approximately 2 year stability. 2. No new findings.  Aortic Atherosclerosis (ICD10-I70.0) and Emphysema (ICD10-J43.9).   Electronically Signed   By: Franki [name] M.D.   On: 07/31/2016 14:28 I personally reviewed the CT chest to occur with findings noted above.  Impression: Allen Yu is a Allen Yu with a history of tobacco abuse and tuberculosis amongst many other issues. Allen Yu was found to have a 10 x 7 mm right upper lobe nodule over a year ago. A PET CT done subsequently showed the nodule had decreased in size but was mildly hypermetabolic. There also were some hypermetabolic lymph nodes that were not pathologically enlarged. We have been following Allen Yu with serial CT since that time. The right upper lobe nodule decreased in size initially but now has stabilized. It has not changed in the interval since his last scan. His lymph nodes also remain unchanged over that timeframe.  I suspect this was infectious/inflammatory in nature. I did recommend that we do one more CT and a year to ensure stability. If the changes remain stable at that time Allen Yu would not need further specific follow-up for that. However, Allen Yu does meet criteria for low dose CT screening due to his ongoing tobacco abuse  Plan: Return in one year with CT chest  Melrose Nakayama, MD [facility] [phone]

## 2016-09-26 DIAGNOSIS — E785 Hyperlipidemia, unspecified: Secondary | ICD-10-CM | POA: Diagnosis not present

## 2016-09-26 DIAGNOSIS — E1169 Type 2 diabetes mellitus with other specified complication: Secondary | ICD-10-CM | POA: Diagnosis not present

## 2016-09-26 DIAGNOSIS — D649 Anemia, unspecified: Secondary | ICD-10-CM | POA: Diagnosis not present

## 2016-09-26 DIAGNOSIS — F209 Schizophrenia, unspecified: Secondary | ICD-10-CM | POA: Diagnosis not present

## 2016-09-26 DIAGNOSIS — F0390 Unspecified dementia without behavioral disturbance: Secondary | ICD-10-CM | POA: Diagnosis not present

## 2016-09-26 DIAGNOSIS — I10 Essential (primary) hypertension: Secondary | ICD-10-CM | POA: Diagnosis not present

## 2016-10-03 DIAGNOSIS — E1122 Type 2 diabetes mellitus with diabetic chronic kidney disease: Secondary | ICD-10-CM | POA: Diagnosis not present

## 2016-10-03 DIAGNOSIS — N183 Chronic kidney disease, stage 3 (moderate): Secondary | ICD-10-CM | POA: Diagnosis not present

## 2016-10-03 DIAGNOSIS — Z6820 Body mass index (BMI) 20.0-20.9, adult: Secondary | ICD-10-CM | POA: Diagnosis not present

## 2016-10-03 DIAGNOSIS — I700 Atherosclerosis of aorta: Secondary | ICD-10-CM | POA: Diagnosis not present

## 2016-10-03 DIAGNOSIS — Z23 Encounter for immunization: Secondary | ICD-10-CM | POA: Diagnosis not present

## 2017-02-07 DIAGNOSIS — Z794 Long term (current) use of insulin: Secondary | ICD-10-CM | POA: Diagnosis not present

## 2017-02-07 DIAGNOSIS — E119 Type 2 diabetes mellitus without complications: Secondary | ICD-10-CM | POA: Diagnosis not present

## 2017-02-07 DIAGNOSIS — H2513 Age-related nuclear cataract, bilateral: Secondary | ICD-10-CM | POA: Diagnosis not present

## 2017-02-07 DIAGNOSIS — H52203 Unspecified astigmatism, bilateral: Secondary | ICD-10-CM | POA: Diagnosis not present

## 2017-02-07 DIAGNOSIS — H524 Presbyopia: Secondary | ICD-10-CM | POA: Diagnosis not present

## 2017-02-14 DIAGNOSIS — D649 Anemia, unspecified: Secondary | ICD-10-CM | POA: Diagnosis not present

## 2017-02-14 DIAGNOSIS — F0390 Unspecified dementia without behavioral disturbance: Secondary | ICD-10-CM | POA: Diagnosis not present

## 2017-02-14 DIAGNOSIS — Z79899 Other long term (current) drug therapy: Secondary | ICD-10-CM | POA: Diagnosis not present

## 2017-02-14 DIAGNOSIS — E1129 Type 2 diabetes mellitus with other diabetic kidney complication: Secondary | ICD-10-CM | POA: Diagnosis not present

## 2017-02-14 DIAGNOSIS — N183 Chronic kidney disease, stage 3 (moderate): Secondary | ICD-10-CM | POA: Diagnosis not present

## 2017-02-18 DIAGNOSIS — E1129 Type 2 diabetes mellitus with other diabetic kidney complication: Secondary | ICD-10-CM | POA: Diagnosis not present

## 2017-02-18 DIAGNOSIS — N183 Chronic kidney disease, stage 3 (moderate): Secondary | ICD-10-CM | POA: Diagnosis not present

## 2017-02-18 DIAGNOSIS — L299 Pruritus, unspecified: Secondary | ICD-10-CM | POA: Diagnosis not present

## 2017-02-18 DIAGNOSIS — Z6820 Body mass index (BMI) 20.0-20.9, adult: Secondary | ICD-10-CM | POA: Diagnosis not present

## 2017-04-04 ENCOUNTER — Emergency Department (HOSPITAL_COMMUNITY): Payer: Medicare Other

## 2017-04-04 ENCOUNTER — Emergency Department (HOSPITAL_COMMUNITY)
Admission: EM | Admit: 2017-04-04 | Discharge: 2017-04-04 | Disposition: A | Discharge status: Home / Self Care | Payer: Medicare Other | Attending: Emergency Medicine | Admitting: Emergency Medicine

## 2017-04-04 ENCOUNTER — Other Ambulatory Visit: Payer: Self-pay

## 2017-04-04 ENCOUNTER — Encounter (HOSPITAL_COMMUNITY): Payer: Self-pay | Admitting: Emergency Medicine

## 2017-04-04 DIAGNOSIS — Y939 Activity, unspecified: Secondary | ICD-10-CM | POA: Insufficient documentation

## 2017-04-04 DIAGNOSIS — Y999 Unspecified external cause status: Secondary | ICD-10-CM | POA: Insufficient documentation

## 2017-04-04 DIAGNOSIS — I129 Hypertensive chronic kidney disease with stage 1 through stage 4 chronic kidney disease, or unspecified chronic kidney disease: Secondary | ICD-10-CM | POA: Diagnosis not present

## 2017-04-04 DIAGNOSIS — Z794 Long term (current) use of insulin: Secondary | ICD-10-CM | POA: Diagnosis not present

## 2017-04-04 DIAGNOSIS — F17210 Nicotine dependence, cigarettes, uncomplicated: Secondary | ICD-10-CM | POA: Insufficient documentation

## 2017-04-04 DIAGNOSIS — Z7982 Long term (current) use of aspirin: Secondary | ICD-10-CM | POA: Diagnosis not present

## 2017-04-04 DIAGNOSIS — R079 Chest pain, unspecified: Secondary | ICD-10-CM | POA: Diagnosis not present

## 2017-04-04 DIAGNOSIS — Z79899 Other long term (current) drug therapy: Secondary | ICD-10-CM | POA: Insufficient documentation

## 2017-04-04 DIAGNOSIS — S39012A Strain of muscle, fascia and tendon of lower back, initial encounter: Secondary | ICD-10-CM | POA: Diagnosis not present

## 2017-04-04 DIAGNOSIS — E1122 Type 2 diabetes mellitus with diabetic chronic kidney disease: Secondary | ICD-10-CM | POA: Diagnosis not present

## 2017-04-04 DIAGNOSIS — Y929 Unspecified place or not applicable: Secondary | ICD-10-CM | POA: Diagnosis not present

## 2017-04-04 DIAGNOSIS — S3992XA Unspecified injury of lower back, initial encounter: Secondary | ICD-10-CM | POA: Diagnosis present

## 2017-04-04 DIAGNOSIS — N182 Chronic kidney disease, stage 2 (mild): Secondary | ICD-10-CM | POA: Insufficient documentation

## 2017-04-04 DIAGNOSIS — W2203XA Walked into furniture, initial encounter: Secondary | ICD-10-CM | POA: Insufficient documentation

## 2017-04-04 DIAGNOSIS — S299XXA Unspecified injury of thorax, initial encounter: Secondary | ICD-10-CM | POA: Diagnosis not present

## 2017-04-04 MED ORDER — TRAMADOL HCL 50 MG PO TABS: 50.0000 mg | ORAL_TABLET | Freq: Four times a day (QID) | ORAL | 0 refills | Status: DC | PRN

## 2017-04-04 MED ORDER — FAMOTIDINE 20 MG PO TABS
20.0000 mg | ORAL_TABLET | Freq: Once | ORAL | Status: AC
  Administered 2017-04-04: 20 mg via ORAL
  Filled 2017-04-04: qty 1

## 2017-04-04 NOTE — Discharge Instructions (Signed)
Apply ice packs on/off to your back.  Avoid twisting or bending movements for at least one week.  Follow-up with your doctor or return to ER for any worsening symptoms

## 2017-04-04 NOTE — ED Triage Notes (Signed)
Pt hit left side of back on counter top and fell. Denies hitting head.

## 2017-04-04 NOTE — ED Notes (Signed)
Patient transported to X-ray 

## 2017-04-06 NOTE — ED Provider Notes (Signed)
[facility] EMERGENCY DEPARTMENT Provider Note   CSN: [id number] Arrival date & time: 04/04/17  0940     History   Chief Complaint Chief Complaint  Patient presents with  . Back Pain    HPI Allen Yu is a 72 y.o. male.  HPI   Allen Yu is a 72 y.o. male who presents to the Emergency Department complaining of left low back pain that occurred 1 day prior to arrival.  States the pain began after he fell up against the edge of a kitchen countertop.  He describes the pain as constant and sharp.  Pain worsens with certain movements.  Improves with rest.  He has not taken any medications for symptomatic relief.  He denies swelling, dysuria, hematuria, chest pain, abdominal pain, shortness of breath numbness or weakness of the lower extremities, urine or bowel changes, and vomiting  Past Medical History:  Diagnosis Date  . Anemia   . Arthritis   . Carotid stenosis   . Chronic back pain   . Chronic kidney disease    stones hx  . Diabetes mellitus   . GERD (gastroesophageal reflux disease)   . Hypertension   . Lung nodule   . Peripheral neuropathy   . PTSD (post-traumatic stress disorder)   . Schizophrenia (Allen Yu)   . Tuberculosis    MED TX X 1 YEAR YEARS AGO     Patient Active Problem List   Diagnosis Date Noted  . Hilar adenopathy 04/10/2016  . Lung nodule seen on imaging study 07/09/2015  . Acute encephalopathy   . Hypoglycemia due to insulin   . Hypertension 07/08/2015  . Insulin dependent diabetes mellitus (Allen Yu) 07/08/2015  . AKI (acute kidney injury) ([place]) 07/08/2015  . Normocytic anemia 07/08/2015  . Tobacco abuse 07/08/2015  . Diverticulosis of colon without hemorrhage   . Left inguinal hernia 09/16/2013  . Altered mental status 11/17/2012  . Hypothermia 11/17/2012  . Leukocytosis 11/17/2012  . CKD (chronic kidney disease), stage II   . PTSD (post-traumatic stress disorder)   . GERD (gastroesophageal reflux disease)     Past Surgical History:    Procedure Laterality Date  . APPENDECTOMY    . BACK SURGERY  2000, 2013   x2  . BLADDER SURGERY     02  . CHOLECYSTECTOMY    . COLONOSCOPY    . COLONOSCOPY N/A 12/07/2014   Procedure: COLONOSCOPY;  Surgeon: Allen Dolin, MD;  Location[facility];  Service: Endoscopy;  Laterality: N/A;  10:30 Am  . HERNIA REPAIR Right   . INGUINAL HERNIA REPAIR Left 11/03/2013   Procedure: HERNIA REPAIR INGUINAL ADULT;  Surgeon: Allen Curry, MD;  Location: [facility];  Service: General;  Laterality: Left;  . SHOULDER SURGERY     RIGHT SHOULDER        Home Medications    Prior to Admission medications   Medication Sig Start Date End Date Taking? Authorizing Provider  aspirin EC 81 MG tablet Take 81 mg by mouth at bedtime.     [provider]  atorvastatin (LIPITOR) 40 MG tablet Take 40 mg by mouth daily. 06/10/15   [provider]  donepezil (ARICEPT) 10 MG tablet Take 15 mg by mouth daily.  06/28/16   [provider]  gabapentin (NEURONTIN) 600 MG tablet Take 600 mg by mouth 2 (two) times daily. 06/10/15   [provider]  insulin lispro (HUMALOG) 100 UNIT/ML injection Inject 10-20 Units into the skin 3 (three) times daily  before meals.     [provider]  LANTUS 100 UNIT/ML injection Inject 75 Units into the skin daily.  06/09/16   [provider]  losartan (COZAAR) 50 MG tablet Take 50 mg by mouth at bedtime.     [provider]  omeprazole (PRILOSEC) 20 MG capsule Take 20 mg by mouth daily.    [provider]  ondansetron (ZOFRAN) 4 MG tablet Take 1 tablet (4 mg total) by mouth every 6 (six) hours. 07/09/16   Allen Christen, MD  sertraline (ZOLOFT) 50 MG tablet Take 50 mg by mouth daily.    [provider]  Tamsulosin HCl (FLOMAX) 0.4 MG CAPS Take 0.4 mg by mouth daily after supper.     [provider]  traMADol (ULTRAM) 50 MG tablet Take 1 tablet (50 mg total) by mouth every 6 (six) hours as needed. 04/04/17    Allen Parkinson, PA-C    Family History Family History  Problem Relation Age of Onset  . Heart disease Mother        before age 66    Social History Social History   Tobacco Use  . Smoking status: Current Every Day Smoker    Packs/day: 1.00    Types: Cigarettes  . Smokeless tobacco: Never Used  Substance Use Topics  . Alcohol use: No    Alcohol/week: 0.0 oz  . Drug use: Yes    Types: Marijuana    Comment: occasional use of marijuana      Allergies   Bee venom; Codeine; Diovan [valsartan]; and Propoxyphene   Review of Systems Review of Systems  Constitutional: Negative for fever.  Respiratory: Negative for shortness of breath.   Cardiovascular: Negative for chest pain.  Gastrointestinal: Negative for abdominal pain, constipation and vomiting.  Genitourinary: Negative for decreased urine volume, difficulty urinating, dysuria, flank pain and hematuria.  Musculoskeletal: Positive for back pain. Negative for joint swelling.  Skin: Negative for rash.  Neurological: Negative for syncope, weakness, numbness and headaches.  All other systems reviewed and are negative.    Physical Exam Updated Vital Signs BP (!) 187/93 (BP Location: Right Arm)   Pulse 82   Temp (!) 97.4 F (36.3 C) (Oral)   Resp 16   Ht 6\' 1"  (1.854 m)   Wt 72.6 kg (160 lb)   SpO2 100%   BMI 21.11 kg/m   Physical Exam  Constitutional: He is oriented to person, place, and time. He appears well-developed and well-nourished. No distress.  HENT:  Head: Normocephalic and atraumatic.  Neck: Normal range of motion. Neck supple.  Cardiovascular: Normal rate, regular rhythm and intact distal pulses.  DP pulses are strong and palpable bilaterally  Pulmonary/Chest: Effort normal and breath sounds normal. No respiratory distress. He exhibits no tenderness.  Abdominal: Soft. He exhibits no distension. There is no tenderness.  Musculoskeletal: He exhibits tenderness. He exhibits no edema.       Lumbar  back: He exhibits tenderness and pain. He exhibits normal range of motion, no swelling, no deformity, no laceration and normal pulse.  ttp of the left lower lumbar paraspinal muscles.  No spinal tenderness.  Pt has 5/5 strength against resistance of bilateral lower extremities.  No edema, abrasion, or ecchymosis.   Neurological: He is alert and oriented to person, place, and time. He has normal strength. No sensory deficit. He exhibits normal muscle tone. Coordination and gait normal.  Reflex Scores:      Patellar reflexes are 2+ on the right side and 2+  on the left side.      Achilles reflexes are 2+ on the right side and 2+ on the left side. Skin: Skin is warm and dry. Capillary refill takes less than 2 seconds. No rash noted.  Nursing note and vitals reviewed.    ED Treatments / Results  Labs (all labs ordered are listed, but only abnormal results are displayed) Labs Reviewed - No data to display  EKG  EKG Interpretation None       Radiology Dg Ribs Unilateral W/chest Left  Result Date: 04/04/2017 CLINICAL DATA:  Pt hit left side of back on counter top and fell 2 days ago/pain mid ribs left side and left lower back/diabetic/htn/hx lung nodule/smoker/hx multiple lower back surgeries EXAM: LEFT RIBS AND CHEST - 3+ VIEW COMPARISON:  Chest CT, 07/31/2016.  Chest radiograph, 07/09/2015. FINDINGS: Heart, mediastinum and hila are within normal limits. Lungs are clear.  No pleural effusion or pneumothorax. No rib fracture or rib lesion. Skeletal structures are demineralized. IMPRESSION: 1. No rib fracture or rib lesion. 2. No acute cardiopulmonary disease. Electronically Signed   By: Lajean Manes M.D.   On: 04/04/2017 12:04   Dg Lumbar Spine Complete  Result Date: 04/04/2017 CLINICAL DATA:  Fall.  Left back injury. EXAM: LUMBAR SPINE - COMPLETE 4+ VIEW COMPARISON:  CT 07/09/2016. FINDINGS: Degenerative changes lumbar spine. L4 through S1 posterior and interbody fusion. Stable appearance  from prior study of 07/09/2016. Hardware intact. Anatomic alignment. Aortoiliac atherosclerotic vascular calcification. IMPRESSION: Degenerative changes lumbar spine. L4 through S1 posterior interbody fusion with stable appearance from prior study of 07/09/2016. No acute abnormality. Electronically Signed   By: Marcello Moores  Register   On: 04/04/2017 12:07     Procedures Procedures (including critical care time)  Medications Ordered in ED Medications  famotidine (PEPCID) tablet 20 mg (20 mg Oral Given 04/04/17 1130)     Initial Impression / Assessment and Plan / ED Course  I have reviewed the triage vital signs and the nursing notes.  Pertinent labs & imaging results that were available during my care of the patient were reviewed by me and considered in my medical decision making (see chart for details).     Patient with injury to left lower back.  Neurovascularly intact.  No abdominal tenderness or tenderness of the left ribs.  X-rays reassuring.  Patient ambulates with steady gait.  No focal neuro deficits on exam.  Likely lumbar strain.  Patient agrees to treatment plan and close PCP follow-up.  Return precautions discussed.  Final Clinical Impressions(s) / ED Diagnoses   Final diagnoses:  Strain of lumbar region, initial encounter    ED Discharge Orders        Ordered    traMADol (ULTRAM) 50 MG tablet  Every 6 hours PRN     04/04/17 1225       Allen Parkinson, PA-C 04/06/17 2049    Milton Ferguson, MD 04/06/17 2135

## 2017-05-14 DIAGNOSIS — Z79899 Other long term (current) drug therapy: Secondary | ICD-10-CM | POA: Diagnosis not present

## 2017-05-14 DIAGNOSIS — E1129 Type 2 diabetes mellitus with other diabetic kidney complication: Secondary | ICD-10-CM | POA: Diagnosis not present

## 2017-05-14 DIAGNOSIS — N183 Chronic kidney disease, stage 3 (moderate): Secondary | ICD-10-CM | POA: Diagnosis not present

## 2017-05-21 DIAGNOSIS — Z681 Body mass index (BMI) 19 or less, adult: Secondary | ICD-10-CM | POA: Diagnosis not present

## 2017-05-21 DIAGNOSIS — E1122 Type 2 diabetes mellitus with diabetic chronic kidney disease: Secondary | ICD-10-CM | POA: Diagnosis not present

## 2017-05-21 DIAGNOSIS — R413 Other amnesia: Secondary | ICD-10-CM | POA: Diagnosis not present

## 2017-05-21 DIAGNOSIS — I700 Atherosclerosis of aorta: Secondary | ICD-10-CM | POA: Diagnosis not present

## 2017-07-03 ENCOUNTER — Other Ambulatory Visit: Payer: Self-pay | Admitting: *Deleted

## 2017-07-03 DIAGNOSIS — R918 Other nonspecific abnormal finding of lung field: Secondary | ICD-10-CM

## 2017-08-20 ENCOUNTER — Other Ambulatory Visit: Payer: Medicare Other

## 2017-08-20 ENCOUNTER — Encounter: Payer: Medicare Other | Admitting: Thoracic Surgery (Cardiothoracic Vascular Surgery)

## 2017-10-01 ENCOUNTER — Other Ambulatory Visit: Payer: Self-pay

## 2017-10-01 ENCOUNTER — Ambulatory Visit
Admission: RE | Admit: 2017-10-01 | Discharge: 2017-10-01 | Disposition: A | Discharge status: Home / Self Care | Payer: Medicare Other | Source: Ambulatory Visit | Attending: Thoracic Surgery (Cardiothoracic Vascular Surgery) | Admitting: Thoracic Surgery (Cardiothoracic Vascular Surgery)

## 2017-10-01 ENCOUNTER — Ambulatory Visit (INDEPENDENT_AMBULATORY_CARE_PROVIDER_SITE_OTHER): Payer: Medicare Other | Admitting: Thoracic Surgery (Cardiothoracic Vascular Surgery)

## 2017-10-01 ENCOUNTER — Encounter: Payer: Self-pay | Admitting: Thoracic Surgery (Cardiothoracic Vascular Surgery)

## 2017-10-01 VITALS — BP 140/70 | HR 76 | Resp 16 | Ht 73.0 in | Wt 133.4 lb

## 2017-10-01 DIAGNOSIS — R918 Other nonspecific abnormal finding of lung field: Secondary | ICD-10-CM

## 2017-10-01 DIAGNOSIS — R911 Solitary pulmonary nodule: Secondary | ICD-10-CM | POA: Diagnosis not present

## 2017-10-01 DIAGNOSIS — Z720 Tobacco use: Secondary | ICD-10-CM | POA: Diagnosis not present

## 2017-10-01 NOTE — Progress Notes (Signed)
[address]       [place] [address]             [phone]       HPI: Allen Yu returns for a scheduled follow-up visit  Allen Yu is a 72 year old man with a history of schizophrenia, tobacco abuse, tuberculosis, emphysema, posttraumatic stress disorder, hypertension, stage II chronic kidney disease, peripheral neuropathy, multiple back surgeries, and reflux.  Back in 2017 he was found to have a 10 x 7 mm nodule on a CT scan done at outside location.  This CT is not available for review.  He had a PET CT in October 2017 which showed the nodule was 6 mm and had an SUV of 1.27.  Given that the nodule had decreased in size we have been following him radiographically since then.  He continues to smoke about a pack of cigarettes a day.  He says that he does not inhale the cigarettes, he just lights them and then throws them away.  He is not interested in trying to quit.  He says his appetite is been poor and he has lost some weight recently.  He has an appointment with Dr. Willey Blade next month.  Past Medical History:  Diagnosis Date  . Anemia   . Arthritis   . Carotid stenosis   . Chronic back pain   . Chronic kidney disease    stones hx  . Diabetes mellitus   . GERD (gastroesophageal reflux disease)   . Hypertension   . Lung nodule   . Peripheral neuropathy   . PTSD (post-traumatic stress disorder)   . Schizophrenia ([place])   . Tuberculosis    MED TX X 1 YEAR YEARS AGO     Current Outpatient Medications  Medication Sig Dispense Refill  . aspirin EC 81 MG tablet Take 81 mg by mouth at bedtime.     [name] atorvastatin (LIPITOR) 40 MG tablet Take 40 mg by mouth daily.  4  . donepezil (ARICEPT) 10 MG tablet Take 15 mg by mouth daily.     [name] gabapentin (NEURONTIN) 600 MG tablet Take 600 mg by mouth 2 (two) times daily.  4  . insulin lispro (HUMALOG) 100 UNIT/ML injection Inject 10-20 Units into the skin 3 (three) times daily before meals.     [name] LANTUS 100 UNIT/ML  injection Inject 75 Units into the skin daily.     [name] losartan (COZAAR) 50 MG tablet Take 50 mg by mouth at bedtime.     [name] omeprazole (PRILOSEC) 20 MG capsule Take 20 mg by mouth daily.    . ondansetron (ZOFRAN) 4 MG tablet Take 1 tablet (4 mg total) by mouth every 6 (six) hours. 10 tablet 0  . sertraline (ZOLOFT) 50 MG tablet Take 50 mg by mouth daily.    . Tamsulosin HCl (FLOMAX) 0.4 MG CAPS Take 0.4 mg by mouth daily after supper.     . traMADol (ULTRAM) 50 MG tablet Take 1 tablet (50 mg total) by mouth every 6 (six) hours as needed. 15 tablet 0   No current facility-administered medications for this visit.     Physical Exam BP 140/70 (BP Location: Right Arm, Patient Position: Sitting, Cuff Size: Normal)   Pulse 76   Resp 16   Ht 6\' 1"  (1.854 m)   Wt 133 lb 6.4 oz (60.5 kg)   SpO2 98% Comment: RA  BMI 17.58 kg/m  72 year old man in no acute distress Alert and oriented  x3, no focal motor deficits No cervical or subclavicular adenopathy Cardiac regular rate and rhythm Lungs diminished but equal breath sounds bilaterally, no wheezing Thenar wasting  Diagnostic Tests: CT CHEST WITHOUT CONTRAST  TECHNIQUE: Multidetector CT imaging of the chest was performed following the standard protocol without IV contrast.  COMPARISON:  07/31/2016 chest CT.  FINDINGS: Cardiovascular: Normal heart size. No significant pericardial effusion/thickening. Three-vessel coronary atherosclerosis. Atherosclerotic nonaneurysmal thoracic aorta. Normal caliber pulmonary arteries.  Mediastinum/Nodes: No discrete thyroid nodules. Unremarkable esophagus. No pathologically enlarged axillary, mediastinal or hilar lymph nodes, noting limited sensitivity for the detection of hilar adenopathy on this noncontrast study.  Lungs/Pleura: No pneumothorax. No pleural effusion. Moderate centrilobular emphysema with mild diffuse bronchial wall thickening. No acute consolidative airspace disease or lung  masses. Tiny 6 x 3 mm apical right upper lobe nodule (series 6/image 25), stable since at least 10/27/2015 PET-CT study, considered benign. Ground-glass 1.0 cm anterior right lower lobe pulmonary nodule (series 8/image 106), stable since 07/31/2016 chest CT. No new significant pulmonary nodules.  Upper abdomen: Cholecystectomy. Simple 1.7 cm upper left renal cyst.  Musculoskeletal: No aggressive appearing focal osseous lesions. Partially visualized surgical hardware from ACD[facility] in the lower cervical spine. Marked lower thoracic spondylosis.  IMPRESSION: 1. Apical right upper lobe 6 x 3 mm pulmonary nodule is stable since at least 10/27/2015 PET-CT study, considered benign. 2. Ground-glass 1.0 cm right lower lobe pulmonary nodule, stable for 1 year. Follow-up noncontrast chest CT is recommended every 2 years until 5 years of stability has been established. This recommendation follows the consensus statement: Guidelines for Management of Incidental Pulmonary Nodules Detected on CT Images:From the Fleischner Society 2017; published online before print (10.1148/radiol.[id number]). 3. No thoracic adenopathy. 4. Moderate centrilobular emphysema with mild diffuse bronchial wall thickening, suggesting COPD. 5. Three-vessel coronary atherosclerosis.  Aortic Atherosclerosis (ICD10-I70.0) and Emphysema (ICD10-J43.9).   Electronically Signed   By: Ilona Sorrel M.D.   On: 10/01/2017 12:09 I personally reviewed the CT images and concur with the findings noted above  Impression: Allen Yu is a 72 year old gentleman with a history of ongoing tobacco abuse, schizophrenia, posttraumatic stress disorder, COPD, tuberculosis, and lung nodules.  His CT today shows the index nodule in the right upper lobe is unchanged.  This goes back a couple of years and that nodule is benign.  He does have a new groundglass opacity in the right lower lobe.  Although the Fleischner criteria suggest a 2-year  interval, he meets criteria for lung cancer screening.  I think he needs to have an annual scan.  For consistency sake we will plan to do a regular CT so that accurate comparisons can be made.  Tobacco abuse-recommended that he stop smoking.  He is not interested in discussing that at all.  Plan: Return in 1 year with CT chest  Melrose Nakayama, MD [facility] [phone]

## 2018-01-09 DIAGNOSIS — Z23 Encounter for immunization: Secondary | ICD-10-CM | POA: Diagnosis not present

## 2018-01-23 DIAGNOSIS — Z79899 Other long term (current) drug therapy: Secondary | ICD-10-CM | POA: Diagnosis not present

## 2018-01-23 DIAGNOSIS — M791 Myalgia, unspecified site: Secondary | ICD-10-CM | POA: Diagnosis not present

## 2018-01-23 DIAGNOSIS — E1129 Type 2 diabetes mellitus with other diabetic kidney complication: Secondary | ICD-10-CM | POA: Diagnosis not present

## 2018-01-23 DIAGNOSIS — D649 Anemia, unspecified: Secondary | ICD-10-CM | POA: Diagnosis not present

## 2018-01-23 DIAGNOSIS — E1122 Type 2 diabetes mellitus with diabetic chronic kidney disease: Secondary | ICD-10-CM | POA: Diagnosis not present

## 2018-01-23 DIAGNOSIS — N183 Chronic kidney disease, stage 3 (moderate): Secondary | ICD-10-CM | POA: Diagnosis not present

## 2018-08-19 DIAGNOSIS — E1165 Type 2 diabetes mellitus with hyperglycemia: Secondary | ICD-10-CM | POA: Diagnosis not present

## 2018-08-19 DIAGNOSIS — E785 Hyperlipidemia, unspecified: Secondary | ICD-10-CM | POA: Diagnosis not present

## 2018-08-19 DIAGNOSIS — D638 Anemia in other chronic diseases classified elsewhere: Secondary | ICD-10-CM | POA: Diagnosis not present

## 2018-08-19 DIAGNOSIS — R809 Proteinuria, unspecified: Secondary | ICD-10-CM | POA: Diagnosis not present

## 2018-08-19 DIAGNOSIS — F200 Paranoid schizophrenia: Secondary | ICD-10-CM | POA: Diagnosis not present

## 2018-08-21 DIAGNOSIS — N183 Chronic kidney disease, stage 3 (moderate): Secondary | ICD-10-CM | POA: Diagnosis not present

## 2018-08-21 DIAGNOSIS — G309 Alzheimer's disease, unspecified: Secondary | ICD-10-CM | POA: Diagnosis not present

## 2018-08-21 DIAGNOSIS — R2689 Other abnormalities of gait and mobility: Secondary | ICD-10-CM | POA: Diagnosis not present

## 2018-08-21 DIAGNOSIS — E1122 Type 2 diabetes mellitus with diabetic chronic kidney disease: Secondary | ICD-10-CM | POA: Diagnosis not present

## 2018-08-22 ENCOUNTER — Other Ambulatory Visit: Payer: Self-pay | Admitting: Internal Medicine

## 2018-08-22 ENCOUNTER — Other Ambulatory Visit (HOSPITAL_COMMUNITY): Payer: Self-pay | Admitting: Internal Medicine

## 2018-08-22 DIAGNOSIS — R269 Unspecified abnormalities of gait and mobility: Secondary | ICD-10-CM

## 2018-08-27 ENCOUNTER — Other Ambulatory Visit: Payer: Self-pay

## 2018-08-27 ENCOUNTER — Ambulatory Visit (HOSPITAL_COMMUNITY)
Admission: RE | Admit: 2018-08-27 | Discharge: 2018-08-27 | Disposition: A | Discharge status: Home / Self Care | Payer: Medicare Other | Source: Ambulatory Visit | Attending: Internal Medicine | Admitting: Internal Medicine

## 2018-08-27 DIAGNOSIS — R269 Unspecified abnormalities of gait and mobility: Secondary | ICD-10-CM

## 2018-08-27 DIAGNOSIS — R42 Dizziness and giddiness: Secondary | ICD-10-CM | POA: Diagnosis not present

## 2018-09-10 ENCOUNTER — Other Ambulatory Visit: Payer: Self-pay | Admitting: Thoracic Surgery (Cardiothoracic Vascular Surgery)

## 2018-09-10 DIAGNOSIS — R911 Solitary pulmonary nodule: Secondary | ICD-10-CM

## 2018-10-08 ENCOUNTER — Ambulatory Visit
Admission: RE | Admit: 2018-10-08 | Discharge: 2018-10-08 | Disposition: A | Discharge status: Home / Self Care | Payer: Medicare Other | Source: Ambulatory Visit | Attending: Thoracic Surgery (Cardiothoracic Vascular Surgery) | Admitting: Thoracic Surgery (Cardiothoracic Vascular Surgery)

## 2018-10-08 DIAGNOSIS — R918 Other nonspecific abnormal finding of lung field: Secondary | ICD-10-CM | POA: Diagnosis not present

## 2018-10-08 DIAGNOSIS — J439 Emphysema, unspecified: Secondary | ICD-10-CM | POA: Diagnosis not present

## 2018-10-08 DIAGNOSIS — R911 Solitary pulmonary nodule: Secondary | ICD-10-CM

## 2018-10-14 ENCOUNTER — Ambulatory Visit: Payer: Medicare Other | Admitting: Thoracic Surgery (Cardiothoracic Vascular Surgery)

## 2018-10-21 ENCOUNTER — Ambulatory Visit: Payer: Medicare Other | Admitting: Thoracic Surgery (Cardiothoracic Vascular Surgery)

## 2018-10-28 ENCOUNTER — Encounter: Payer: Self-pay | Admitting: Thoracic Surgery (Cardiothoracic Vascular Surgery)

## 2018-10-28 ENCOUNTER — Ambulatory Visit (INDEPENDENT_AMBULATORY_CARE_PROVIDER_SITE_OTHER): Payer: Medicare Other | Admitting: Thoracic Surgery (Cardiothoracic Vascular Surgery)

## 2018-10-28 ENCOUNTER — Other Ambulatory Visit: Payer: Self-pay

## 2018-10-28 VITALS — BP 164/71 | HR 63 | Temp 97.7°F | Resp 16 | Ht 73.0 in

## 2018-10-28 DIAGNOSIS — I2510 Atherosclerotic heart disease of native coronary artery without angina pectoris: Secondary | ICD-10-CM | POA: Diagnosis not present

## 2018-10-28 DIAGNOSIS — R918 Other nonspecific abnormal finding of lung field: Secondary | ICD-10-CM

## 2018-10-28 NOTE — Progress Notes (Signed)
[address]       [place] [address]             [phone]      HPI: [name] returns for an annual CT follow-up  [name] is a 73 year old man with a history of schizophrenia, tobacco abuse, tuberculosis, emphysema, posttraumatic stress disorder, hypertension, stage II chronic kidney disease, peripheral neuropathy, and reflux.  He has also had multiple back surgeries.  He was found to have a 10 x 7 mm nodule in the right lung on outside CT in 2017.  On PET CT the nodule did have some activity with an SUV of 1.27 but only measured 6 mm.  We will follow him radiographically and that nodule never increased in size.  I last saw him in September 2019.  He had a new groundglass opacity in the right lower lobe at that time.  Denies fevers, chills, sweats, cough, hemoptysis.  Complains of joint pain with colder weather.  Past Medical History:  Diagnosis Date  . Anemia   . Arthritis   . Carotid stenosis   . Chronic back pain   . Chronic kidney disease    stones hx  . Diabetes mellitus   . GERD (gastroesophageal reflux disease)   . Hypertension   . Lung nodule   . Peripheral neuropathy   . PTSD (post-traumatic stress disorder)   . Schizophrenia ([place])   . Tuberculosis    MED TX X 1 YEAR YEARS AGO     Current Outpatient Medications  Medication Sig Dispense Refill  . aspirin EC 81 MG tablet Take 81 mg by mouth at bedtime.     [name] atorvastatin (LIPITOR) 40 MG tablet Take 40 mg by mouth daily.  4  . donepezil (ARICEPT) 10 MG tablet Take 15 mg by mouth daily.     [name] gabapentin (NEURONTIN) 600 MG tablet Take 600 mg by mouth 2 (two) times daily.  4  . insulin lispro (HUMALOG) 100 UNIT/ML injection Inject 10-20 Units into the skin 3 (three) times daily before meals.     [name] LANTUS 100 UNIT/ML injection Inject 75 Units into the skin daily.     [name] losartan (COZAAR) 50 MG tablet Take 50 mg by mouth at bedtime.     [name] omeprazole (PRILOSEC) 20 MG capsule Take 20 mg by  mouth daily.    . ondansetron (ZOFRAN) 4 MG tablet Take 1 tablet (4 mg total) by mouth every 6 (six) hours. 10 tablet 0  . sertraline (ZOLOFT) 50 MG tablet Take 50 mg by mouth daily.    . Tamsulosin HCl (FLOMAX) 0.4 MG CAPS Take 0.4 mg by mouth daily after supper.     . traMADol (ULTRAM) 50 MG tablet Take 1 tablet (50 mg total) by mouth every 6 (six) hours as needed. 15 tablet 0   No current facility-administered medications for this visit.     Physical Exam BP (!) 164/71 (BP Location: Right Arm, Patient Position: Sitting, Cuff Size: Normal)   Pulse 63   Temp 97.7 F (36.5 C)   Resp 16   Ht 6\' 1"  (1.854 m)   SpO2 98% Comment: RA  BMI 17.60 kg/m  Thin 73 year old African-American man in no acute distress Alert and oriented x3 No cervical or supraclavicular adenopathy Cardiac regular rate and rhythm, no murmur Lungs diminished breath sounds bilaterally, no rales or wheezing  Diagnostic Tests: CT CHEST WITHOUT CONTRAST  TECHNIQUE: Multidetector CT imaging of the chest was  performed following the standard protocol without IV contrast.  COMPARISON:  Chest CT 10/01/2017 and 07/31/2016.  FINDINGS: Cardiovascular: Extensive atherosclerosis of the coronary arteries with lesser involvement of the aorta and great vessels. Probable aortic valvular calcifications. Intraluminal vascular low-density suggesting anemia. The heart size is normal. There is no pericardial effusion.  Mediastinum/Nodes: There are no enlarged mediastinal, hilar or axillary lymph nodes.Hilar assessment is limited by the lack of intravenous contrast, although the hilar contours appear unchanged. The thyroid gland, trachea and esophagus demonstrate no significant findings.  Lungs/Pleura: There is no pleural effusion or pneumothorax. Moderate to severe centrilobular emphysema with diffuse central airway thickening and biapical scarring. There is a new right upper lobe cavitary lesion with slightly  thickened but smooth walls, measuring 12 mm on image 41/8. This demonstrates no significant solid components. Evaluation is mildly limited by breathing artifact. The pure ground-glass opacity anteriorly in the right lower lobe has not significantly changed, measuring approximately 12 x 10 mm on image 108/8.  Upper abdomen: No acute findings are seen within the visualized upper abdomen. There is stable mild biliary dilatation post cholecystectomy. A small left renal cyst is noted.  Musculoskeletal/Chest wall: There is no chest wall mass or suspicious osseous finding. Stable postsurgical changes in the cervical spine and degenerative disc disease at T12-L1.  IMPRESSION: 1. New cavitary right upper lobe lesion with slight wall thickening, but no definite solid components. This could be postinflammatory. Follow-up chest CT at 6 months is recommended. This recommendation follows the consensus statement: Guidelines for Management of Small Pulmonary Nodules Detected on CT Scans: A Statement from the [place] as published in Radiology 2005; 237:395-400. 2. No other new or enlarging pulmonary nodules. Stable right apical scarring and right lower lobe ground-glass opacity. 3. Three-vessel coronary artery atherosclerosis. Aortic Atherosclerosis (ICD10-I70.0). 4.  Emphysema (ICD10-J43.9).   Electronically Signed   By: Richardean Sale M.D.   On: 10/08/2018 12:34 I personally reviewed the CT images and concur with the findings noted above  Impression: Allen Yu is a 73 year old man with a history of schizophrenia, tobacco abuse, tuberculosis, emphysema, posttraumatic stress disorder, hypertension, stage II chronic kidney disease, peripheral neuropathy, and reflux.  He was found to have a 10 x 7 mm lung nodule in 2017.  That was mildly active on PET/CT but was smaller in size.  He has been followed since then with no particularly suspicious findings although he did have a new  groundglass opacity in the right lower lobe a year ago.  On his most recent scan there is a cavitary lesion in the right upper lobe.  Radiology favors this being infectious or inflammatory.  It would have an unusual appearance for a primary bronchogenic carcinoma.  His groundglass opacity in the right lower lobe is unchanged.  We will plan to rescan him in 6 months to reassess the right upper lobe lesion.  Coronary artery calcification-noted on CT scan.  Multiple cardiac risk factors.  No anginal symptoms.  He is on Lipitor.  Plan: Return in 6 months with CT chest to follow-up possible cavitary right upper lobe lung nodule  Melrose Nakayama, MD [facility] [phone]

## 2018-11-06 ENCOUNTER — Telehealth: Payer: Self-pay

## 2018-11-06 ENCOUNTER — Ambulatory Visit: Payer: Medicare Other | Admitting: Neurology

## 2018-11-06 NOTE — Telephone Encounter (Signed)
Pt did not show for their appt with Dr. Athar today.  

## 2018-12-17 ENCOUNTER — Encounter: Payer: Self-pay | Admitting: Neurology

## 2018-12-17 ENCOUNTER — Other Ambulatory Visit: Payer: Self-pay

## 2018-12-17 ENCOUNTER — Ambulatory Visit (INDEPENDENT_AMBULATORY_CARE_PROVIDER_SITE_OTHER): Payer: Medicare Other | Admitting: Neurology

## 2018-12-17 VITALS — BP 165/85 | HR 92 | Temp 97.7°F | Ht 73.0 in | Wt 131.0 lb

## 2018-12-17 DIAGNOSIS — Z8673 Personal history of transient ischemic attack (TIA), and cerebral infarction without residual deficits: Secondary | ICD-10-CM | POA: Diagnosis not present

## 2018-12-17 DIAGNOSIS — H269 Unspecified cataract: Secondary | ICD-10-CM

## 2018-12-17 DIAGNOSIS — M479 Spondylosis, unspecified: Secondary | ICD-10-CM | POA: Diagnosis not present

## 2018-12-17 DIAGNOSIS — R296 Repeated falls: Secondary | ICD-10-CM

## 2018-12-17 DIAGNOSIS — R2689 Other abnormalities of gait and mobility: Secondary | ICD-10-CM | POA: Diagnosis not present

## 2018-12-17 DIAGNOSIS — I2510 Atherosclerotic heart disease of native coronary artery without angina pectoris: Secondary | ICD-10-CM

## 2018-12-17 DIAGNOSIS — E0842 Diabetes mellitus due to underlying condition with diabetic polyneuropathy: Secondary | ICD-10-CM | POA: Diagnosis not present

## 2018-12-17 DIAGNOSIS — H9193 Unspecified hearing loss, bilateral: Secondary | ICD-10-CM | POA: Diagnosis not present

## 2018-12-17 NOTE — Progress Notes (Signed)
Subjective:    Patient ID: Allen Yu is a 73 y.o. male.  HPI     Star Age, MD, PhD [facility] [address], [address] [place], [place] [address]  Dear Dr. Willey Blade,   I saw your patient, Allen Yu, upon your kind request, in my neurologic clinic today for initial consultation of his gait disorder.  The patient is accompanied by his brother, Allen Yu, today.  He missed an appointment on 11/06/2018. As you know, Allen Yu is a 73 year old right-handed gentleman with an underlying complex medical history of hypertension, diabetes with history of neuropathy, chronic kidney disease, chronic back pain, status post spine surgery, carotid stenosis, arthritis, history of lacunar strokes, anemia, PTSD, and schizophrenia by chart review, history of TB with s/p medical treatment, smoking, and abnormal TSH, who reports problems with his gait and balance.  Unfortunately, he is hard of hearing despite hearing aids in place.  Communication is difficult and history is also provided by his brother.  His brother reports that the patient has had gait and balance problems for at least 2 years.  He has a four-point cane which he brought today but he also has a walker which he does not use very much, he also has a wheelchair through the [place] as I understand.  He has fallen.  He has visual impairment including blurry vision, he had surgery on both eyes, he has cataracts but cannot have them removed as he states.  He reports that he has metal in his eyes from his work related injuries.  He was able to have an MRI without any report of metal artifact on the MRI report which I reviewed.  I reviewed your office note from 08/21/2018, which you kindly included. You ordered a brain MRI, he had a brain MRI without contrast on 08/27/2018 and I reviewed the results: IMPRESSION: 1. No acute intracranial abnormality or significant interval change. 2. Stable atrophy and diffuse white matter  disease, moderately advanced for age. This likely reflects the sequela of chronic microvascular ischemia. 3. Stable remote lacunar infarct of the left pons. 4. Stable remote lacunar infarcts of the right basal ganglia. He had recent blood work through your office on 08/19/2018 and I reviewed the results: CBC with differential showed hemoglobin of 10.2 which is reduced and hematocrit low at 30, glucose was elevated at 240, creatinine elevated at 1.66, BUN 26.  Albumin mildly high at 4.9, alk phos mildly elevated at 126, lipid panel unremarkable with total cholesterol of 125, triglycerides 73, HDL 53, LDL 57, A1c elevated at 8.7, TSH slightly low at 0.422 with a low cutoff at 0.45.  He reports drinking plenty of water, he drinks about 3 or 4 16 ounce bottles of water per day, no alcohol, he smokes a pack per day but insists that he does not inhale.  He also smokes marijuana daily and is not ready to stop this.  He has blurry vision, reports having had an eye exam in January of this year.  He denies any one-sided weakness or numbness or tingling, he has numbness in both legs for years as I understand, also reports some pain in the left more than right feet.  He reports that he drives locally.   His Past Medical History Is Significant For: Past Medical History:  Diagnosis Date  . Anemia   . Arthritis   . Carotid stenosis   . Chronic back pain   . Chronic kidney disease    stones  hx  . Diabetes mellitus   . GERD (gastroesophageal reflux disease)   . Hypertension   . Lung nodule   . Peripheral neuropathy   . PTSD (post-traumatic stress disorder)   . Schizophrenia ([place])   . Tuberculosis    MED TX X 1 YEAR YEARS AGO     His Past Surgical History Is Significant For: Past Surgical History:  Procedure Laterality Date  . APPENDECTOMY    . BACK SURGERY  2000, 2013   x2  . BLADDER SURGERY     02  . CHOLECYSTECTOMY    . COLONOSCOPY    . COLONOSCOPY N/A 12/07/2014   Procedure: COLONOSCOPY;   Surgeon: Daneil Dolin, MD;  [facility];  Service: Endoscopy;  Laterality: N/A;  10:30 Am  . HERNIA REPAIR Right   . INGUINAL HERNIA REPAIR Left 11/03/2013   Procedure: HERNIA REPAIR INGUINAL ADULT;  Surgeon: Gayland Curry, MD;  Location: [facility];  Service: General;  Laterality: Left;  . SHOULDER SURGERY     RIGHT SHOULDER     His Family History Is Significant For: Family History  Problem Relation Age of Onset  . Heart disease Mother        before age 15    His Social History Is Significant For: Social History   Socioeconomic History  . Marital status: Married    Spouse name: Not on file  . Number of children: Not on file  . Years of education: Not on file  . Highest education level: Not on file  Occupational History  . Not on file  Social Needs  . Financial resource strain: Not on file  . Food insecurity    Worry: Not on file    Inability: Not on file  . Transportation needs    Medical: Not on file    Non-medical: Not on file  Tobacco Use  . Smoking status: Current Every Day Smoker    Packs/day: 1.00    Types: Cigarettes  . Smokeless tobacco: Never Used  Substance and Sexual Activity  . Alcohol use: No    Alcohol/week: 0.0 standard drinks  . Drug use: Yes    Types: Marijuana    Comment: occasional use of marijuana   . Sexual activity: Never  Lifestyle  . Physical activity    Days per week: Not on file    Minutes per session: Not on file  . Stress: Not on file  Relationships  . Social Herbalist on phone: Not on file    Gets together: Not on file    Attends religious service: Not on file    Active member of club or organization: Not on file    Attends meetings of clubs or organizations: Not on file    Relationship status: Not on file  Other Topics Concern  . Not on file  Social History Narrative  . Not on file    His Allergies Are:  Allergies  Allergen Reactions  . Bee Venom Other (See Comments)    Unknown   . Codeine Other  (See Comments)    incoherent   . Diovan [Valsartan] Other (See Comments)    incoherent  . Propoxyphene Other (See Comments)    Dizziness, "Makes me feel drunk"  :   His Current Medications Are:  Outpatient Encounter Medications as of 12/17/2018  Medication Sig  . aspirin EC 81 MG tablet Take 81 mg by mouth at bedtime.   [name] atorvastatin (LIPITOR) 40 MG tablet  Take 40 mg by mouth daily.  [name] donepezil (ARICEPT) 10 MG tablet Take 15 mg by mouth daily.   [name] gabapentin (NEURONTIN) 600 MG tablet Take 600 mg by mouth 2 (two) times daily.  . insulin lispro (HUMALOG) 100 UNIT/ML injection Inject 10-20 Units into the skin 3 (three) times daily before meals.   [name] LANTUS 100 UNIT/ML injection Inject 75 Units into the skin daily.   [name] losartan (COZAAR) 50 MG tablet Take 50 mg by mouth at bedtime.   [name] omeprazole (PRILOSEC) 20 MG capsule Take 20 mg by mouth daily.  . ondansetron (ZOFRAN) 4 MG tablet Take 1 tablet (4 mg total) by mouth every 6 (six) hours.  . sertraline (ZOLOFT) 50 MG tablet Take 50 mg by mouth daily.  . Tamsulosin HCl (FLOMAX) 0.4 MG CAPS Take 0.4 mg by mouth daily after supper.   . traMADol (ULTRAM) 50 MG tablet Take 1 tablet (50 mg total) by mouth every 6 (six) hours as needed.   No facility-administered encounter medications on file as of 12/17/2018.   : Review of Systems:  Out of a complete 14 point review of systems, all are reviewed and negative with the exception of these symptoms as listed below:  Review of Systems  Neurological:       Pt presents today to discuss his gait. Pt reports that his gait has been bad for the past couple years but it is getting worse.    Objective:  Neurological Exam  Physical Exam Physical Examination:   Vitals:   12/17/18 1011  BP: (!) 165/85  Pulse: 92  Temp: 97.7 F (36.5 C)    General Examination: The patient is a very pleasant 73 y.o. male in no acute distress. He appears Frail and deconditioned, adequately groomed.  He is quite  hard of hearing, communication is difficult but brother also facilitates.  HEENT: Normocephalic, atraumatic, pupils are Sluggish to react, left cornea appears to be crowded and conjunctival mild edema noted on the left eye, both cataracts are noted and appear to be quite dense.  Extraocular tracking is fair bilaterally, hearing is quite impaired, bilateral hearing aids in place. Face is symmetric, normal facial animation, airway examination reveals moderate mouth dryness, coated tongue, marginal dental hygiene, tongue protrudes centrally and palate elevates symmetrically. No carotid bruits.  Chest: Clear to auscultation without wheezing, rhonchi or crackles noted.  Heart: S1+S2+0, regular and normal without murmurs, rubs or gallops noted.   Abdomen: Soft, non-tender and non-distended with normal bowel sounds appreciated on auscultation.  Extremities: There is no edema in the distal lower extremities bilaterally. Pedal pulses are difficult to palpate, faint.   Skin: Warm and dry without trophic changes noted. There are no varicose veins.  Musculoskeletal: exam reveals no obvious joint deformities, tenderness or joint swelling or erythema.   Neurologically:  Mental status: The patient is awake, alert and oriented in all 4 spheres. His immediate and remote memory, attention, language skills and fund of knowledge are Impaired but communication is also difficult, no obvious dysarthria, no hypophonia, no voice tremor, mood is constricted and affect is blunted.  Cranial nerves II - XII are as described above under HEENT exam. In addition: shoulder shrug is normal with equal shoulder height noted. Motor exam: thin Muscle bulk, no fasciculations, he has thin muscle bulk in the right hand, he has mild weakness in the right upper extremity, left sided strength in the upper extremity is fairly normal, no obvious weakness in the lower extremities, reflexes are 1+  in the upper extremities and absent in the  lower extremities, toes are equivocal bilaterally. Cerebellar testing: He has difficulty with finger-to-nose on the left side, heel-to-shin slightly impaired on the left side, finger-to-nose and heel-to-shin better on the right side.  Foot taps are mildly impaired on the right side but reasonably normal on the left side.  Sensory exam shows decreased sensation to all modalities up to the upper calf area bilaterally in the lower extremities, fairly preserved in the upper extremities, may be slight decrease in pinprick sensation in both hands. He stands with significant difficulty and stands wide-based, Romberg cannot be tested safely.  He has a 4 pronged cane but walks quite unsteadily and wide-based, his balance is quite impaired.  He does not appear to be safe to use the cane only.  He did not bring his walker, he reports he has a 2 wheeled walker at home. Fine motor skills with finger taps and hand movements are impaired in both upper extremities and the mild range.  Assessment and Plan:   In summary, Allen Yu is a very pleasant 73 y.o.-year old male with an underlying complex medical history of hypertension, diabetes with history of neuropathy, chronic kidney disease, chronic back pain, status post spine surgery, carotid stenosis, arthritis, history of lacunar strokes, anemia, PTSD, and schizophrenia by chart review, history of TB with s/p medical treatment, smoking, and abnormal TSH, who Presents for evaluation of his gait disorder and balance problem.  He has had falls.  His symptoms have been ongoing for at least 2 years per brother's report. 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  He has multiple stroke risk factors.  We talked about the importance of secondary Stroke prevention and fall prevention at length today. Unfortunately, for his gait  disorder, there is not a whole lot I can offer him.  Prevention of falls is going to be key for him.  He is strongly advised to quit smoking.  He is also advised to quit smoking marijuana as this can affect his balance.  He is not very amenable to either 1 of these recommendations.  Furthermore, I do not believe he is safe to just use a cane even a 4 pronged cane, he is advised to use his walker at all times and stand up slowly, stay well hydrated with water. He is also strongly discouraged from driving, I do not believe he is safe to drive any longer, he does have difficulty with coordination and fine motor skills on the right side and left side, he has hearing impairment and reports blurry vision, he has follow-up with his eye doctor and reports that he cannot have cataract surgeries.He has evidence of diabetic neuropathy.  He has had suboptimal diabetes control.  He is encouraged to follow-up with you for medical management of his chronic medical problems.  I provided written instructions today, I answered all their questions today. Thank you very much for allowing me to participate in the care of this nice patient. If I can be of any further assistance to you please do not hesitate to call me at [phone].  Sincerely,   Star Age, MD, PhD

## 2018-12-17 NOTE — Patient Instructions (Addendum)
I believe you have a multifactorial gait disorder, meaning, that it is Allen Yu is due to a combination of factors. These factors include: degenerative arthritis of your back, Having had more than 1 stroke in the past, atherosclerosis of the blood vessels, also in your brain, suboptimal diabetes control with diabetic neuropathy/nerve damage.  Unfortunately, there is not a whole lot I can offer you at this time.  I do recommend that you use your walker at all times, you are not safe to use the cane alone.  I do not think you are safe to drive any longer.  Please also discuss with your primary care physician.  Your coordination is impaired, your hearing is impaired and you also have blurry vision and you have evidence of cataracts on both sides.  Please try to hydrate well with water, 6 to 8 cups of water or 3 to 4 16 ounce bottles are recommended.  I think it is very important that you quit smoking, also please quit smoking marijuana, this can affect your balance as well.  For your diabetes related neuropathy, optimization of your diabetes control, improving your blood sugar values in essence is going to be critical.  Please follow-up with Dr. Willey Blade.  As discussed, secondary prevention is key after a stroke. This means: taking care of blood sugar values or diabetes management (A1c goal of less than 7.0), good blood pressure (hypertension) control and optimizing cholesterol management (with LDL goal of less than 70), exercising daily or regularly within your own mobility limitations of course.    You can follow-up with your primary care physician.

## 2019-02-06 DIAGNOSIS — F0150 Vascular dementia without behavioral disturbance: Secondary | ICD-10-CM | POA: Diagnosis not present

## 2019-02-06 DIAGNOSIS — N183 Chronic kidney disease, stage 3 unspecified: Secondary | ICD-10-CM | POA: Diagnosis not present

## 2019-02-06 DIAGNOSIS — E1129 Type 2 diabetes mellitus with other diabetic kidney complication: Secondary | ICD-10-CM | POA: Diagnosis not present

## 2019-02-06 DIAGNOSIS — D649 Anemia, unspecified: Secondary | ICD-10-CM | POA: Diagnosis not present

## 2019-02-06 DIAGNOSIS — Z79899 Other long term (current) drug therapy: Secondary | ICD-10-CM | POA: Diagnosis not present

## 2019-02-12 DIAGNOSIS — E1122 Type 2 diabetes mellitus with diabetic chronic kidney disease: Secondary | ICD-10-CM | POA: Diagnosis not present

## 2019-02-12 DIAGNOSIS — D638 Anemia in other chronic diseases classified elsewhere: Secondary | ICD-10-CM | POA: Diagnosis not present

## 2019-02-12 DIAGNOSIS — N1831 Chronic kidney disease, stage 3a: Secondary | ICD-10-CM | POA: Diagnosis not present

## 2019-03-07 ENCOUNTER — Ambulatory Visit: Payer: Medicare Other | Attending: Internal Medicine

## 2019-03-07 DIAGNOSIS — Z23 Encounter for immunization: Secondary | ICD-10-CM

## 2019-03-07 NOTE — Progress Notes (Signed)
   Covid-19 Vaccination Clinic  Name:  Allen Yu    MRN: [id number] DOB: 09-Dec-1945  03/07/2019  [name] was observed post Covid-19 immunization for 15 minutes without incidence. He was provided with Vaccine Information Sheet and instruction to access the V-Safe system.   [name] was instructed to call 911 with any severe reactions post vaccine: [name] Difficulty breathing  . Swelling of your face and throat  . A fast heartbeat  . A bad rash all over your body  . Dizziness and weakness    Immunizations Administered    Name Date Dose VIS Date Route   Moderna COVID-19 Vaccine 03/07/2019 12:06 PM 0.5 mL 12/23/2018 Intramuscular   Manufacturer: Moderna   Lot: [id number]   [id number]

## 2019-04-01 ENCOUNTER — Other Ambulatory Visit: Payer: Self-pay | Admitting: Thoracic Surgery (Cardiothoracic Vascular Surgery)

## 2019-04-01 DIAGNOSIS — R911 Solitary pulmonary nodule: Secondary | ICD-10-CM

## 2019-04-04 ENCOUNTER — Ambulatory Visit: Payer: Medicare Other | Attending: Internal Medicine

## 2019-04-04 DIAGNOSIS — Z23 Encounter for immunization: Secondary | ICD-10-CM

## 2019-04-04 NOTE — Progress Notes (Signed)
   Covid-19 Vaccination Clinic  Name:  Allen Yu    MRN: [id number] DOB: 08-17-1945  04/04/2019  Mr. [name] was observed post Covid-19 immunization for 15 minutes without incident. He was provided with Vaccine Information Sheet and instruction to access the V-Safe system.   Mr. [name] was instructed to call 911 with any severe reactions post vaccine: [name] Difficulty breathing  . Swelling of face and throat  . A fast heartbeat  . A bad rash all over body  . Dizziness and weakness   Immunizations Administered    Name Date Dose VIS Date Route   Moderna COVID-19 Vaccine 04/04/2019  9:54 AM 0.5 mL 12/23/2018 Intramuscular   Manufacturer: Moderna   Lot: [id number]   [id number]

## 2019-04-30 ENCOUNTER — Other Ambulatory Visit: Payer: Medicare Other

## 2019-05-05 ENCOUNTER — Ambulatory Visit: Payer: Medicare Other | Admitting: Thoracic Surgery (Cardiothoracic Vascular Surgery)

## 2019-05-05 ENCOUNTER — Other Ambulatory Visit: Payer: Medicare Other

## 2019-05-07 DIAGNOSIS — E1129 Type 2 diabetes mellitus with other diabetic kidney complication: Secondary | ICD-10-CM | POA: Diagnosis not present

## 2019-05-07 DIAGNOSIS — E785 Hyperlipidemia, unspecified: Secondary | ICD-10-CM | POA: Diagnosis not present

## 2019-05-07 DIAGNOSIS — Z79899 Other long term (current) drug therapy: Secondary | ICD-10-CM | POA: Diagnosis not present

## 2019-05-07 DIAGNOSIS — D638 Anemia in other chronic diseases classified elsewhere: Secondary | ICD-10-CM | POA: Diagnosis not present

## 2019-05-07 DIAGNOSIS — E1140 Type 2 diabetes mellitus with diabetic neuropathy, unspecified: Secondary | ICD-10-CM | POA: Diagnosis not present

## 2019-05-07 DIAGNOSIS — N183 Chronic kidney disease, stage 3 unspecified: Secondary | ICD-10-CM | POA: Diagnosis not present

## 2019-05-13 DIAGNOSIS — E1122 Type 2 diabetes mellitus with diabetic chronic kidney disease: Secondary | ICD-10-CM | POA: Diagnosis not present

## 2019-05-13 DIAGNOSIS — I714 Abdominal aortic aneurysm, without rupture: Secondary | ICD-10-CM | POA: Diagnosis not present

## 2019-05-13 DIAGNOSIS — N1831 Chronic kidney disease, stage 3a: Secondary | ICD-10-CM | POA: Diagnosis not present

## 2019-05-13 DIAGNOSIS — Z681 Body mass index (BMI) 19 or less, adult: Secondary | ICD-10-CM | POA: Diagnosis not present

## 2019-06-09 ENCOUNTER — Ambulatory Visit: Payer: Medicare Other | Admitting: Thoracic Surgery (Cardiothoracic Vascular Surgery)

## 2019-06-09 ENCOUNTER — Other Ambulatory Visit: Payer: Medicare Other

## 2019-06-09 ENCOUNTER — Other Ambulatory Visit: Payer: Self-pay

## 2019-06-23 ENCOUNTER — Other Ambulatory Visit: Payer: Medicare Other

## 2019-06-30 ENCOUNTER — Encounter: Payer: Self-pay | Admitting: Thoracic Surgery (Cardiothoracic Vascular Surgery)

## 2019-06-30 ENCOUNTER — Other Ambulatory Visit: Payer: Self-pay | Admitting: Thoracic Surgery (Cardiothoracic Vascular Surgery)

## 2019-06-30 ENCOUNTER — Ambulatory Visit (INDEPENDENT_AMBULATORY_CARE_PROVIDER_SITE_OTHER): Payer: Medicare Other | Admitting: Thoracic Surgery (Cardiothoracic Vascular Surgery)

## 2019-06-30 ENCOUNTER — Other Ambulatory Visit: Payer: Self-pay | Admitting: *Deleted

## 2019-06-30 ENCOUNTER — Encounter: Payer: Self-pay | Admitting: *Deleted

## 2019-06-30 ENCOUNTER — Ambulatory Visit
Admission: RE | Admit: 2019-06-30 | Discharge: 2019-06-30 | Disposition: A | Discharge status: Home / Self Care | Payer: Medicare Other | Source: Ambulatory Visit | Attending: Thoracic Surgery (Cardiothoracic Vascular Surgery) | Admitting: Thoracic Surgery (Cardiothoracic Vascular Surgery)

## 2019-06-30 ENCOUNTER — Other Ambulatory Visit: Payer: Self-pay

## 2019-06-30 VITALS — BP 137/80 | HR 74 | Temp 98.6°F | Resp 20 | Ht 73.0 in | Wt 159.0 lb

## 2019-06-30 DIAGNOSIS — R911 Solitary pulmonary nodule: Secondary | ICD-10-CM

## 2019-06-30 NOTE — H&P (View-Only) (Signed)
[address]       [place] [address]             [phone]     HPI: [name] returns to discuss the results of  Allen Yu is a 74 year old man with a past history of tobacco abuse, lung nodule, schizophrenia, PTSD, stage II chronic kidney disease, insulin-dependent diabetes, hypertension, peripheral neuropathy, and gastroesophageal reflux.  He was found to have a 10 x 7 mm nodule in the right lung on an outside CT in 2017.  On PET CT the nodule had only mild activity with an SUV of 1.27.  The nodule had gotten smaller in the interim, so he was followed radiographically.  In September 2019 he had a new groundglass opacity in the right lower lobe.  Last saw him in October 2020.  There was no change in the groundglass opacity.  However, there was a new cavitary right upper lobe lesion with slight wall thickening but no definite solid component.  He is advised to return for 36-month follow-up.  He continues to smoke a pack a day.  He came to the office today in a wheelchair because he has difficulty walking.  He says that is primarily due to his peripheral neuropathy.  He denies any significant weight loss or change in appetite.  No shortness of breath, but limited physical activity.  No chest pain, pressure, or tightness.  Past Medical History:  Diagnosis Date  . Anemia   . Arthritis   . Carotid stenosis   . Chronic back pain   . Chronic kidney disease    stones hx  . Diabetes mellitus   . GERD (gastroesophageal reflux disease)   . Hypertension   . Lung nodule   . Peripheral neuropathy   . PTSD (post-traumatic stress disorder)   . Schizophrenia ([place])   . Tuberculosis    MED TX X 1 YEAR YEARS AGO      Current Outpatient Medications  Medication Sig Dispense Refill  . aspirin EC 81 MG tablet Take 81 mg by mouth at bedtime.     [name] atorvastatin (LIPITOR) 40 MG tablet Take 40 mg by mouth daily.  4  . donepezil (ARICEPT) 10 MG tablet Take 15 mg by mouth  daily.     [name] gabapentin (NEURONTIN) 600 MG tablet Take 600 mg by mouth 2 (two) times daily.  4  . insulin lispro (HUMALOG) 100 UNIT/ML injection Inject 10-20 Units into the skin 3 (three) times daily before meals.     [name] LANTUS 100 UNIT/ML injection Inject 75 Units into the skin daily.     [name] losartan (COZAAR) 50 MG tablet Take 50 mg by mouth at bedtime.     [name] omeprazole (PRILOSEC) 20 MG capsule Take 20 mg by mouth daily.    . ondansetron (ZOFRAN) 4 MG tablet Take 1 tablet (4 mg total) by mouth every 6 (six) hours. 10 tablet 0  . sertraline (ZOLOFT) 50 MG tablet Take 50 mg by mouth daily.    . Tamsulosin HCl (FLOMAX) 0.4 MG CAPS Take 0.4 mg by mouth daily after supper.     . traMADol (ULTRAM) 50 MG tablet Take 1 tablet (50 mg total) by mouth every 6 (six) hours as needed. 15 tablet 0   No current facility-administered medications for this visit.    Physical Exam Vitals reviewed.  Constitutional:      General: He is not in acute distress.    Appearance: He  is ill-appearing.  HENT:     Head: Normocephalic and atraumatic.  Eyes:     General: No scleral icterus.    Extraocular Movements: Extraocular movements intact.  Cardiovascular:     Rate and Rhythm: Normal rate and regular rhythm.     Heart sounds: Normal heart sounds.  Pulmonary:     Effort: Pulmonary effort is normal. No respiratory distress.     Breath sounds: Normal breath sounds. No wheezing or rales.  Abdominal:     General: There is no distension.     Palpations: Abdomen is soft.     Tenderness: There is no abdominal tenderness.  Musculoskeletal:     Cervical back: Neck supple.  Lymphadenopathy:     Cervical: No cervical adenopathy.  Skin:    General: Skin is warm and dry.  Neurological:     General: No focal deficit present.     Mental Status: He is alert and oriented to person, place, and time.     Cranial Nerves: No cranial nerve deficit.     Motor: No weakness.     Diagnostic Tests: CT CHEST WITHOUT  CONTRAST  TECHNIQUE: Multidetector CT imaging of the chest was performed following the standard protocol without IV contrast.  COMPARISON:  CT chest dated 10/08/2018  FINDINGS: Cardiovascular: Heart is normal in size.  No pericardial effusion.  No evidence of thoracic aortic aneurysm. Atherosclerotic calcifications of the aortic arch.  Three vessel coronary atherosclerosis.  Mediastinum/Nodes: Small mediastinal lymph nodes, including a 6 mm short axis low right paratracheal node and 5 mm short axis subcarinal node, within normal limits.  Visualized thyroid is unremarkable.  Lungs/Pleura: 2.1 x 2.7 x 2.6 cm thick-walled cavitary lesion in the posterior right upper lobe (series 8/image 42), previously 12 mm, compatible with primary bronchogenic neoplasm such as squamous cell carcinoma.  Mild peribronchovascular satellite nodularity in the posterior right upper lobe measuring up to 4 mm (series 8/image 49).  Moderate centrilobular and paraseptal emphysematous changes, upper lung predominant.  No focal consolidation.  No pleural effusion or pneumothorax.  Upper Abdomen: Visualized upper abdomen is notable for prior cholecystectomy, vascular calcifications, and left renal cysts.  Musculoskeletal: Cervical spine fixation hardware, incompletely visualized. Degenerative changes of the upper lumbar spine.  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  Adjacent small satellite nodules measuring up to 4 mm, indeterminate.  Small mediastinal nodes measuring 5-6 mm, within normal limits.  Aortic Atherosclerosis (ICD10-I70.0) and Emphysema (ICD10-J43.9).   Electronically Signed   By: Allen Yu M.D.   On: 06/30/2019 10:34  I personally reviewed the CT images and concur with the findings noted  above.   Impression: Allen Yu is a 74 year old man with a past history of tobacco abuse, lung nodule, schizophrenia, PTSD, stage II chronic kidney disease, insulin-dependent diabetes, hypertension, peripheral neuropathy, and gastroesophageal reflux.  He presented apical right upper lobe lung nodule in 2017.  That got smaller between the CT and a PET/CT and then remained stable over time.  I saw him last fall he had developed a new small cavitary lesion in the right upper lobe.  On his CT today that nodule has increased in size significantly and has a much thicker wall.  Differential diagnosis includes primary bronchogenic carcinoma, as well as infectious or inflammatory nodules.  This could be an atypical infection.  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  His physical activity is very limited.  I have serious concerns about his ability to tolerate a surgical resection.  I am going to check pulmonary function testing just for completeness sake.  He does need a PET/CT to guide the initial diagnostic work-up.  I suspect the best way to get a biopsy will be to do a navigational bronchoscopy, but the PET could show [place] an alternative site for biopsy.  We will go ahead and schedule that.  I described the proposed procedure of navigational bronchoscopy to Mr. Reza and his brother.  They understand this will be done in the operating room under general anesthesia.  They understand the endoscopic nature of the procedure.  They understand it is diagnostic and not therapeutic.  I informed him of the indications, risks, benefits, and alternatives.  They understand the risks include, but not limited to those associated with general anesthesia as well as procedure specific risks such as pneumothorax, bleeding, and failure to make a diagnosis.  We will tentatively schedule navigational bronchoscopy for 07/10/2019 to allow  time for the PET/CT to be done to make sure that that would not change our plan.  Plan: PFTs PET/CT-size of cavitary lung nodule, guide initial diagnostic work-up Electromagnetic navigational bronchoscopy, possible endobronchial ultrasound on 07/10/2019  I spent 30 minutes today in review of records, review of images, and in consultation with [name]. Melrose Nakayama, MD [facility] [phone]

## 2019-06-30 NOTE — Progress Notes (Signed)
[address]       [place] [address]             [id number]     HPI: Allen Yu returns to discuss the results of  Allen Yu is a 74 year old man with a past history of tobacco abuse, lung nodule, schizophrenia, PTSD, stage II chronic kidney disease, insulin-dependent diabetes, hypertension, peripheral neuropathy, and gastroesophageal reflux.  Allen Yu was found to have a 10 x 7 mm nodule in the right lung on an outside CT in 2017.  On PET CT the nodule had only mild activity with an SUV of 1.27.  The nodule had gotten smaller in the interim, so Allen Yu was followed radiographically.  In September 2019 Allen Yu had a new groundglass opacity in the right lower lobe.  Last saw him in October 2020.  There was no change in the groundglass opacity.  However, there was a new cavitary right upper lobe lesion with slight wall thickening but no definite solid component.  Allen Yu is advised to return for 19-month follow-up.  Allen Yu continues to smoke a pack a day.  Allen Yu came to the office today in a wheelchair because Allen Yu has difficulty walking.  Allen Yu says that is primarily due to his peripheral neuropathy.  Allen Yu denies any significant weight loss or change in appetite.  No shortness of breath, but limited physical activity.  No chest pain, pressure, or tightness.  Past Medical History:  Diagnosis Date  . Anemia   . Arthritis   . Carotid stenosis   . Chronic back pain   . Chronic kidney disease    stones hx  . Diabetes mellitus   . GERD (gastroesophageal reflux disease)   . Hypertension   . Lung nodule   . Peripheral neuropathy   . PTSD (post-traumatic stress disorder)   . Schizophrenia ([place])   . Tuberculosis    MED TX X 1 YEAR YEARS AGO      Current Outpatient Medications  Medication Sig Dispense Refill  . aspirin EC 81 MG tablet Take 81 mg by mouth at bedtime.     [name] atorvastatin (LIPITOR) 40 MG tablet Take 40 mg by mouth daily.  4  . donepezil (ARICEPT) 10 MG tablet Take 15 mg by mouth  daily.     [name] gabapentin (NEURONTIN) 600 MG tablet Take 600 mg by mouth 2 (two) times daily.  4  . insulin lispro (HUMALOG) 100 UNIT/ML injection Inject 10-20 Units into the skin 3 (three) times daily before meals.     [name] LANTUS 100 UNIT/ML injection Inject 75 Units into the skin daily.     [name] losartan (COZAAR) 50 MG tablet Take 50 mg by mouth at bedtime.     [name] omeprazole (PRILOSEC) 20 MG capsule Take 20 mg by mouth daily.    . ondansetron (ZOFRAN) 4 MG tablet Take 1 tablet (4 mg total) by mouth every 6 (six) hours. 10 tablet 0  . sertraline (ZOLOFT) 50 MG tablet Take 50 mg by mouth daily.    . Tamsulosin HCl (FLOMAX) 0.4 MG CAPS Take 0.4 mg by mouth daily after supper.     . traMADol (ULTRAM) 50 MG tablet Take 1 tablet (50 mg total) by mouth every 6 (six) hours as needed. 15 tablet 0   No current facility-administered medications for this visit.    Physical Exam Vitals reviewed.  Constitutional:      General: Allen Yu is not in acute distress.    Appearance: Allen Yu  is ill-appearing.  HENT:     Head: Normocephalic and atraumatic.  Eyes:     General: No scleral icterus.    Extraocular Movements: Extraocular movements intact.  Cardiovascular:     Rate and Rhythm: Normal rate and regular rhythm.     Heart sounds: Normal heart sounds.  Pulmonary:     Effort: Pulmonary effort is normal. No respiratory distress.     Breath sounds: Normal breath sounds. No wheezing or rales.  Abdominal:     General: There is no distension.     Palpations: Abdomen is soft.     Tenderness: There is no abdominal tenderness.  Musculoskeletal:     Cervical back: Neck supple.  Lymphadenopathy:     Cervical: No cervical adenopathy.  Skin:    General: Skin is warm and dry.  Neurological:     General: No focal deficit present.     Mental Status: Allen Yu is alert and oriented to person, place, and time.     Cranial Nerves: No cranial nerve deficit.     Motor: No weakness.     Diagnostic Tests: CT CHEST WITHOUT  CONTRAST  TECHNIQUE: Multidetector CT imaging of the chest was performed following the standard protocol without IV contrast.  COMPARISON:  CT chest dated 10/08/2018  FINDINGS: Cardiovascular: Heart is normal in size.  No pericardial effusion.  No evidence of thoracic aortic aneurysm. Atherosclerotic calcifications of the aortic arch.  Three vessel coronary atherosclerosis.  Mediastinum/Nodes: Small mediastinal lymph nodes, including a 6 mm short axis low right paratracheal node and 5 mm short axis subcarinal node, within normal limits.  Visualized thyroid is unremarkable.  Lungs/Pleura: 2.1 x 2.7 x 2.6 cm thick-walled cavitary lesion in the posterior right upper lobe (series 8/image 42), previously 12 mm, compatible with primary bronchogenic neoplasm such as squamous cell carcinoma.  Mild peribronchovascular satellite nodularity in the posterior right upper lobe measuring up to 4 mm (series 8/image 49).  Moderate centrilobular and paraseptal emphysematous changes, upper lung predominant.  No focal consolidation.  No pleural effusion or pneumothorax.  Upper Abdomen: Visualized upper abdomen is notable for prior cholecystectomy, vascular calcifications, and left renal cysts.  Musculoskeletal: Cervical spine fixation hardware, incompletely visualized. Degenerative changes of the upper lumbar spine.  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  Adjacent small satellite nodules measuring up to 4 mm, indeterminate.  Small mediastinal nodes measuring 5-6 mm, within normal limits.  Aortic Atherosclerosis (ICD10-I70.0) and Emphysema (ICD10-J43.9).   Electronically Signed   By: Julian Hy M.D.   On: 06/30/2019 10:34  I personally reviewed the CT images and concur with the findings noted  above.   Impression: Allen Yu is a 74 year old man with a past history of tobacco abuse, lung nodule, schizophrenia, PTSD, stage II chronic kidney disease, insulin-dependent diabetes, hypertension, peripheral neuropathy, and gastroesophageal reflux.  Allen Yu presented apical right upper lobe lung nodule in 2017.  That got smaller between the CT and a PET/CT and then remained stable over time.  I saw him last fall Allen Yu had developed a new small cavitary lesion in the right upper lobe.  On his CT today that nodule has increased in size significantly and has a much thicker wall.  Differential diagnosis includes primary bronchogenic carcinoma, as well as infectious or inflammatory nodules.  This could be an atypical infection.  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  His physical activity is very limited.  I have serious concerns about his ability to tolerate a surgical resection.  I am going to check pulmonary function testing just for completeness sake.  Allen Yu does need a PET/CT to guide the initial diagnostic work-up.  I suspect the best way to get a biopsy will be to do a navigational bronchoscopy, but the PET could show [place] an alternative site for biopsy.  We will go ahead and schedule that.  I described the proposed procedure of navigational bronchoscopy to Allen Yu and his brother.  They understand this will be done in the operating room under general anesthesia.  They understand the endoscopic nature of the procedure.  They understand it is diagnostic and not therapeutic.  I informed him of the indications, risks, benefits, and alternatives.  They understand the risks include, but not limited to those associated with general anesthesia as well as procedure specific risks such as pneumothorax, bleeding, and failure to make a diagnosis.  We will tentatively schedule navigational bronchoscopy for 07/10/2019 to allow  time for the PET/CT to be done to make sure that that would not change our plan.  Plan: PFTs PET/CT-size of cavitary lung nodule, guide initial diagnostic work-up Electromagnetic navigational bronchoscopy, possible endobronchial ultrasound on 07/10/2019  I spent 30 minutes today in review of records, review of images, and in consultation with Allen Yu. Melrose Nakayama, MD [facility] [phone]

## 2019-07-03 ENCOUNTER — Encounter (HOSPITAL_COMMUNITY): Payer: Medicare Other

## 2019-07-06 NOTE — Pre-Procedure Instructions (Signed)
Your procedure is scheduled on Friday, July 10 2019 from 07:30 AM- 08:56 AM.  Report to [facility] Main Entrance "A" at 05:30 A.M., and check in at the Admitting office.  Call this number if you have problems the morning of surgery:  [phone]  Call [phone] if you have any questions prior to your surgery date Monday-Friday 8am-4pm.    Remember:  Do not eat or drink after midnight the night before your surgery.     Take these medicines the morning of surgery with A SIP OF WATER:  IF NEEDED: acetaminophen (TYLENOL)  *Follow your surgeon's instructions on when to stop Aspirin.  If no instructions were given by your surgeon then you will need to call the office to get those instructions.     As of today, STOP taking any Aspirin containing products, Aleve, Naproxen, Ibuprofen, Motrin, Advil, Goody's, BC's, all herbal medications, fish oil, and all vitamins.    WHAT DO I DO ABOUT MY DIABETES MEDICATION?    THE NIGHT BEFORE SURGERY, take 30 units of LANTUS insulin. Do not take insulin lispro (HUMALOG).      THE MORNING OF SURGERY, DO NOT TAKE insulin lispro (HUMALOG). Only take if your CBG is greater than 220 mg/dL; If it is, you may take  of your sliding scale (correction) dose of insulin.   HOW TO MANAGE YOUR DIABETES BEFORE AND AFTER SURGERY  Why is it important to control my blood sugar before and after surgery?  Improving blood sugar levels before and after surgery helps healing and can limit problems.  A way of improving blood sugar control is eating a healthy diet by: o  Eating less sugar and carbohydrates o  Increasing activity/exercise o  Talking with your doctor about reaching your blood sugar goals  High blood sugars (greater than 180 mg/dL) can raise your risk of infections and slow your recovery, so you will need to focus on controlling your diabetes during the weeks before surgery.  Make sure that the doctor who takes care of your diabetes knows  about your planned surgery including the date and location.  How do I manage my blood sugar before surgery?  Check your blood sugar at least 4 times a day, starting 2 days before surgery, to make sure that the level is not too high or low.  Check your blood sugar the morning of your surgery when you wake up and every 2 hours until you get to the Short Stay unit. o If your blood sugar is less than 70 mg/dL, you will need to treat for low blood sugar: - Do not take insulin. - Treat a low blood sugar (less than 70 mg/dL) with  cup of clear juice (cranberry or apple), 4 glucose tablets, OR glucose gel. - Recheck blood sugar in 15 minutes after treatment (to make sure it is greater than 70 mg/dL). If your blood sugar is not greater than 70 mg/dL on recheck, call [phone] for further instructions.  Report your blood sugar to the short stay nurse when you get to Short Stay.   If you are admitted to the hospital after surgery: o Your blood sugar will be checked by the staff and you will probably be given insulin after surgery (instead of oral diabetes medicines) to make sure you have good blood sugar levels. o The goal for blood sugar control after surgery is 80-180 mg/dL.           The Morning of Surgery:  Do not wear jewelry.            Do not wear lotions, powders, colognes, or deodorant.            Men may shave face and neck.            Do not bring valuables to the hospital.            [facility] is not responsible for any belongings or valuables.  Do NOT Smoke (Tobacco/Vapping) or drink Alcohol 24 hours prior to your procedure.  If you use a CPAP at night, you may bring all equipment for your overnight stay.   Contacts, glasses, dentures or bridgework may not be worn into surgery.      For patients admitted to the hospital, discharge time will be determined by your treatment team.   Patients discharged the day of surgery will not be allowed to drive home, and someone  needs to stay with them for 24 hours.    Special instructions:   [facility]- Preparing For Surgery  Before surgery, you can play an important role. Because skin is not sterile, your skin needs to be as free of germs as possible. You can reduce the number of germs on your skin by washing with CHG (chlorahexidine gluconate) Soap before surgery.  CHG is an antiseptic cleaner which kills germs and bonds with the skin to continue killing germs even after washing.    Oral Hygiene is also important to reduce your risk of infection.  Remember - BRUSH YOUR TEETH THE MORNING OF SURGERY WITH YOUR REGULAR TOOTHPASTE  Please do not use if you have an allergy to CHG or antibacterial soaps. If your skin becomes reddened/irritated stop using the CHG.  Do not shave (including legs and underarms) for at least 48 hours prior to first CHG shower. It is OK to shave your face.  Please follow these instructions carefully.   1. Shower the NIGHT BEFORE SURGERY and the MORNING OF SURGERY with CHG Soap.   2. If you chose to wash your hair, wash your hair first as usual with your normal shampoo.  3. After you shampoo, rinse your hair and body thoroughly to remove the shampoo.  4. Use CHG as you would any other liquid soap. You can apply CHG directly to the skin and wash gently with a scrungie or a clean washcloth.   5. Apply the CHG Soap to your body ONLY FROM THE NECK DOWN.  Do not use on open wounds or open sores. Avoid contact with your eyes, ears, mouth and genitals (private parts). Wash Face and genitals (private parts)  with your normal soap.   6. Wash thoroughly, paying special attention to the area where your surgery will be performed.  7. Thoroughly rinse your body with warm water from the neck down.  8. DO NOT shower/wash with your normal soap after using and rinsing off the CHG Soap.  9. Pat yourself dry with a CLEAN TOWEL.  10. Wear CLEAN PAJAMAS to bed the night before surgery, wear comfortable  clothes the morning of surgery  11. Place CLEAN SHEETS on your bed the night of your first shower and DO NOT SLEEP WITH PETS.   Day of Surgery: Shower with CHG Soap.  Do not apply any deodorants/lotions.  Please wear clean clothes to the hospital/surgery center.   Remember to brush your teeth WITH YOUR REGULAR TOOTHPASTE.   Please read over the following fact sheets that you were given.

## 2019-07-07 ENCOUNTER — Other Ambulatory Visit (HOSPITAL_COMMUNITY)
Admission: RE | Admit: 2019-07-07 | Discharge: 2019-07-07 | Disposition: A | Discharge status: Home / Self Care | Payer: Medicare Other | Source: Ambulatory Visit | Attending: Thoracic Surgery (Cardiothoracic Vascular Surgery) | Admitting: Thoracic Surgery (Cardiothoracic Vascular Surgery)

## 2019-07-07 ENCOUNTER — Inpatient Hospital Stay (HOSPITAL_COMMUNITY)
Admission: RE | Admit: 2019-07-07 | Discharge: 2019-07-07 | Disposition: A | Discharge status: Home / Self Care | Payer: Medicare Other | Source: Ambulatory Visit

## 2019-07-07 ENCOUNTER — Ambulatory Visit (HOSPITAL_COMMUNITY): Payer: Medicare Other

## 2019-07-07 ENCOUNTER — Other Ambulatory Visit: Payer: Self-pay

## 2019-07-07 ENCOUNTER — Encounter (HOSPITAL_COMMUNITY): Payer: Self-pay

## 2019-07-07 ENCOUNTER — Other Ambulatory Visit (HOSPITAL_COMMUNITY): Payer: Medicare Other

## 2019-07-07 DIAGNOSIS — R911 Solitary pulmonary nodule: Secondary | ICD-10-CM | POA: Diagnosis present

## 2019-07-07 DIAGNOSIS — Z20822 Contact with and (suspected) exposure to covid-19: Secondary | ICD-10-CM | POA: Insufficient documentation

## 2019-07-07 HISTORY — DX: Unspecified dementia, unspecified severity, without behavioral disturbance, psychotic disturbance, mood disturbance, and anxiety: F03.90

## 2019-07-07 NOTE — Progress Notes (Signed)
Levonne Spiller, RN with Dr. Leonarda Salon office notified that patient did not show for PAT appointment, same-day phone call conducted instead, and patient's brother Madhav Mohon was requesting to reschedule COVID test.

## 2019-07-07 NOTE — Progress Notes (Addendum)
Same- Day Work-Up Phone Call: Phone call interview was completed by Patient's brother, Diron Haddon ([phone]) as patient is [facility] and has a hx of mild dementia.  PCP - [name], PA-C Cardiologist - Denies  PPM/ICD - Denies  Chest x-ray - DOS EKG - DOS Stress Test - Per patient, > 5 years ago, cannot remember by whom, but thinks it was done at [facility]. ECHO - 07/10/15 Cardiac Cath - 10/01/02  Sleep Study - Denies  Blood Thinner Instructions: N/A Aspirin Instructions: Per patient, continue.  ERAS Protcol - No PRE-SURGERY Ensure or G2- N/A  COVID TEST- originally 07/07/19 @ [place], Per patient, was not told about COVID Testing scheduled today. TBD by scheduler.  Medication instructions, patient arrival date, time and location, and NPO status given. See previous note for Pre-Procedural Instructions.   Anesthesia review: Yes, review ECHO, Cardiac cath.  Patient denies shortness of breath, fever, cough and chest pain at PAT appointment   All instructions explained to the patient, with a verbal understanding of the material. Patient agrees to go over the instructions while at home for a better understanding. Patient also instructed to self quarantine after being tested for COVID-19. The opportunity to ask questions was provided.

## 2019-07-07 NOTE — Progress Notes (Addendum)
Patient did not show up for Pre-Admission Testing (PAT) appointment. Per patient, he was not told about PAT appointment or COVID Test scheduled for today. Called to do Same-Day Work-up instead.

## 2019-07-08 ENCOUNTER — Ambulatory Visit (HOSPITAL_COMMUNITY)
Admission: RE | Admit: 2019-07-08 | Discharge: 2019-07-08 | Disposition: A | Discharge status: Home / Self Care | Payer: Medicare Other | Source: Ambulatory Visit | Attending: Thoracic Surgery (Cardiothoracic Vascular Surgery) | Admitting: Thoracic Surgery (Cardiothoracic Vascular Surgery)

## 2019-07-08 DIAGNOSIS — R911 Solitary pulmonary nodule: Secondary | ICD-10-CM | POA: Diagnosis present

## 2019-07-08 DIAGNOSIS — J988 Other specified respiratory disorders: Secondary | ICD-10-CM | POA: Diagnosis not present

## 2019-07-08 LAB — PULMONARY FUNCTION TEST
DL/VA % pred: 64 %
DL/VA: 2.54 ml/min/mmHg/L
DLCO unc % pred: 49 %
DLCO unc: 13.75 ml/min/mmHg
FEF 25-75 Post: 2.29 L/sec
FEF 25-75 Pre: 2.23 L/sec
FEF2575-%Change-Post: 2 %
FEF2575-%Pred-Post: 86 %
FEF2575-%Pred-Pre: 84 %
FEV1-%Change-Post: 0 %
FEV1-%Pred-Post: 88 %
FEV1-%Pred-Pre: 89 %
FEV1-Post: 2.84 L
FEV1-Pre: 2.85 L
FEV1FVC-%Change-Post: 8 %
FEV1FVC-%Pred-Pre: 97 %
FEV6-%Change-Post: -8 %
FEV6-%Pred-Post: 86 %
FEV6-%Pred-Pre: 95 %
FEV6-Post: 3.52 L
FEV6-Pre: 3.85 L
FEV6FVC-%Pred-Post: 104 %
FEV6FVC-%Pred-Pre: 104 %
FVC-%Change-Post: -8 %
FVC-%Pred-Post: 83 %
FVC-%Pred-Pre: 90 %
FVC-Post: 3.52 L
FVC-Pre: 3.85 L
Post FEV1/FVC ratio: 81 %
Post FEV6/FVC ratio: 100 %
Pre FEV1/FVC ratio: 74 %
Pre FEV6/FVC Ratio: 100 %
RV % pred: 143 %
RV: 3.85 L
TLC % pred: 97 %
TLC: 7.46 L

## 2019-07-08 LAB — SARS CORONAVIRUS 2 (TAT 6-24 HRS): SARS Coronavirus 2: NEGATIVE

## 2019-07-08 MED ORDER — ALBUTEROL SULFATE (2.5 MG/3ML) 0.083% IN NEBU
2.5000 mg | INHALATION_SOLUTION | Freq: Once | RESPIRATORY_TRACT | Status: AC
  Administered 2019-07-08: 2.5 mg via RESPIRATORY_TRACT

## 2019-07-08 NOTE — Progress Notes (Signed)
Anesthesia Chart Review: SAME DAY WORK-UP (patient did not show for 07/07/19 PAT)  Case: [id number] Date/Time: 07/10/19 0715   Procedure: VIDEO BRONCHOSCOPY WITH ENDOBRONCHIAL NAVIGATION (N/A )   Anesthesia type: General   Pre-op diagnosis: RUL NODULE   Location: [facility] OR ROOM 10 / [place] OR   Surgeons: Melrose Nakayama, MD      DISCUSSION: Patient is a 74 year old male scheduled for the above procedure. By notes, he was found to have a RUL lung nodule on 2017 CT with only mild activity on PET scan and decreased in size. He had a RLL nodule on 2019 CT, stable on 2020 CT but with new RUL cavity lesion, increased in size on 06/30/19 and concerning for malignancy. The above procedure recommended.   Same- Day Work-Up Phone Call: RN Phone call interview was completed by Patient's brother, Muhanad Torosyan ([phone]) as patient is [facility] and has a hx of mild dementia.  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 No significant CAD (calcification on LAD without stenosis, RCA minimal luminal irregularities on 2004 LHC.  Second Moderna Covid-19 vaccine 04/04/2019.  07/07/2019 presurgical COVID-19 test negative.  07/08/2019 PFTs are still pending.  Anesthesia team to evaluate on the day of surgery.   VS:  BP Readings from Last 3 Encounters:  06/30/19 137/80  12/17/18 (!) 165/85  10/28/18 (!) 164/71   Pulse Readings from Last 3 Encounters:  06/30/19 74  12/17/18 92  10/28/18 63    PROVIDERS: [name], MD is PCP  - He  was evaluated by vascular surgeon Deitra Mayo, MD on 07/29/15 after CT scan showed 75% right ICA stenosis (in setting of some motion artifact), however, follow-up carotid US showed no significant disease. No further work-up recommended with as needed vascular follow-up.     LABS: For day of surgery.   IMAGES: He is for CXR on the day of surgery.  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 - Adjacent small satellite nodules measuring up to 4 mm, indeterminate. - Small mediastinal nodes measuring 5-6 mm, within normal limits. - Aortic Atherosclerosis (ICD10-I70.0) and Emphysema (ICD10-J43.9).   EKG: He is for EKG on the day of surgery.    CV: Carotid US 07/29/15: Impression: Bilateral 1 to 39% diameter reduction of the internal carotid arteries with tortuosity noted.   Echo 07/10/15: Study Conclusions  - Left ventricle: The cavity size was normal. Systolic function was  normal. The estimated ejection fraction was in the range of 55%  to 60%. Wall motion was normal; there were no regional wall  motion abnormalities. Left ventricular diastolic function  parameters were normal. Mild focal basal septal hypertrophy.  - Aortic valve: Mildly thickened leaflets.  - Mitral valve: Mildly thickened leaflets .  - Systemic veins: IVC dilated with normal respiratory variation.  Estimated CVP 8 mmHg.    Cardiac cath 10/01/02 [facility], Sabino Snipes, MD): FINDINGS: 1. LV 157/14/21.  EF 65% without regional wall motion abnormality. 2. No aortic stenosis or mitral regurgitation. 3  Left Main:  Fluoroscopy reveals calcification of the distal vessel;     however, there is no stenosis. 4. LAD:  The LAD is a large vessel wrapping the apex of the heart and giving     rise to two large moderate-sized diagonals.  The vessel is      angiographically normal. 5. Circumflex:  Large vessel giving rise to two obtuse marginals.  It is     angiographically normal. 6. RCA:  Large, dominant vessel.  There is minimal luminal irregularity. IMPRESSION AND RECOMMENDATIONS:  The patient has no significant coronary disease.  I suspect a gastrointestinal etiology to his chest discomfort.   Past Medical History:  Diagnosis Date  . Anemia   . Arthritis   . Carotid stenosis   . Chronic back pain   . Chronic kidney disease    stones hx  . Dementia ([place])   . Diabetes mellitus   . GERD (gastroesophageal reflux disease)   . Hypertension   . Lung nodule   . Peripheral neuropathy   . PTSD (post-traumatic stress disorder)   . Schizophrenia ([place])   . Tuberculosis    MED TX X 1 YEAR YEARS AGO     Past Surgical History:  Procedure Laterality Date  . APPENDECTOMY    . BACK SURGERY  2000, 2013   x2  . BLADDER SURGERY     02  . CHOLECYSTECTOMY    . COLONOSCOPY    . COLONOSCOPY N/A 12/07/2014   Procedure: COLONOSCOPY;  Surgeon: Daneil Dolin, MD;  Location[facility];  Service: Endoscopy;  Laterality: N/A;  10:30 Am  . EYE SURGERY Bilateral    removed metal from eye  . HERNIA REPAIR Right   . INGUINAL HERNIA REPAIR Left 11/03/2013   Procedure: HERNIA REPAIR INGUINAL ADULT;  Surgeon: Gayland Curry, MD;  Location: [place];  Service: General;  Laterality: Left;  . SHOULDER SURGERY     RIGHT SHOULDER     MEDICATIONS: No current facility-administered medications for this encounter.   [name] acetaminophen (TYLENOL) 500 MG tablet  . aspirin EC 81 MG tablet  . Aspirin-Caffeine (BC FAST PAIN RELIEF PO)  . aspirin-sod bicarb-citric acid (ALKA-SELTZER) 325 MG TBEF tablet  . atorvastatin (LIPITOR) 40 MG tablet  . donepezil (ARICEPT) 10 MG tablet  . insulin lispro (HUMALOG) 100 UNIT/ML injection  . LANTUS 100 UNIT/ML injection  . losartan (COZAAR) 50 MG tablet     Myra Gianotti, PA-C Surgical Short Stay/Anesthesiology [facility]  Phone [phone] [facility] Phone [phone] 07/08/2019 10:23 AM

## 2019-07-08 NOTE — Anesthesia Preprocedure Evaluation (Addendum)
Anesthesia Evaluation  Patient identified by MRN, date of birth, ID band Patient awake    Reviewed: Allergy & Precautions, NPO status , Patient's Chart, lab work & pertinent test results  History of Anesthesia Complications Negative for: history of anesthetic complications  Airway Mallampati: II  TM Distance: >3 FB Neck ROM: Limited    Dental  (+) Dental Advisory Given, Teeth Intact   Pulmonary Current Smoker and Patient abstained from smoking.,   Hx TB s/p treatment    Pulmonary exam normal        Cardiovascular hypertension, Pt. on medications + CAD and + Peripheral Vascular Disease  Normal cardiovascular exam     Neuro/Psych PSYCHIATRIC DISORDERS Anxiety Schizophrenia Dementia  S/p ACDF   Neuromuscular disease (neuropathy)    GI/Hepatic Neg liver ROS, GERD  Controlled,  Endo/Other  diabetes, Type 2, Insulin Dependent  Renal/GU CRFRenal disease     Musculoskeletal  (+) Arthritis ,   Abdominal   Peds  Hematology negative hematology ROS (+)   Anesthesia Other Findings Covid test negative   Reproductive/Obstetrics                           Anesthesia Physical Anesthesia Plan  ASA: III  Anesthesia Plan: General   Post-op Pain Management:    Induction: Intravenous  PONV Risk Score and Plan: 2 and Treatment may vary due to age or medical condition, Ondansetron and Propofol infusion  Airway Management Planned: Oral ETT  Additional Equipment: None  Intra-op Plan:   Post-operative Plan: Extubation in OR  Informed Consent: I have reviewed the patients History and Physical, chart, labs and discussed the procedure including the risks, benefits and alternatives for the proposed anesthesia with the patient or authorized representative who has indicated his/her understanding and acceptance.     Dental advisory given  Plan Discussed with: CRNA and Anesthesiologist  Anesthesia  Plan Comments:       Anesthesia Quick Evaluation

## 2019-07-10 ENCOUNTER — Other Ambulatory Visit: Payer: Self-pay

## 2019-07-10 ENCOUNTER — Encounter (HOSPITAL_COMMUNITY)
Admission: RE | Disposition: A | Discharge status: Home / Self Care | Payer: Self-pay | Source: Home / Self Care | Attending: Thoracic Surgery (Cardiothoracic Vascular Surgery)

## 2019-07-10 ENCOUNTER — Ambulatory Visit (HOSPITAL_COMMUNITY): Payer: Medicare Other

## 2019-07-10 ENCOUNTER — Ambulatory Visit (HOSPITAL_COMMUNITY)
Admission: RE | Admit: 2019-07-10 | Discharge: 2019-07-10 | Disposition: A | Discharge status: Home / Self Care | Payer: Medicare Other | Attending: Thoracic Surgery (Cardiothoracic Vascular Surgery) | Admitting: Thoracic Surgery (Cardiothoracic Vascular Surgery)

## 2019-07-10 ENCOUNTER — Encounter (HOSPITAL_COMMUNITY): Payer: Self-pay | Admitting: Thoracic Surgery (Cardiothoracic Vascular Surgery)

## 2019-07-10 ENCOUNTER — Ambulatory Visit (HOSPITAL_COMMUNITY): Payer: Medicare Other | Admitting: Vascular Surgery

## 2019-07-10 DIAGNOSIS — F209 Schizophrenia, unspecified: Secondary | ICD-10-CM | POA: Insufficient documentation

## 2019-07-10 DIAGNOSIS — F4310 Post-traumatic stress disorder, unspecified: Secondary | ICD-10-CM | POA: Insufficient documentation

## 2019-07-10 DIAGNOSIS — I700 Atherosclerosis of aorta: Secondary | ICD-10-CM | POA: Insufficient documentation

## 2019-07-10 DIAGNOSIS — N182 Chronic kidney disease, stage 2 (mild): Secondary | ICD-10-CM | POA: Insufficient documentation

## 2019-07-10 DIAGNOSIS — E1142 Type 2 diabetes mellitus with diabetic polyneuropathy: Secondary | ICD-10-CM | POA: Insufficient documentation

## 2019-07-10 DIAGNOSIS — F17210 Nicotine dependence, cigarettes, uncomplicated: Secondary | ICD-10-CM | POA: Insufficient documentation

## 2019-07-10 DIAGNOSIS — Z794 Long term (current) use of insulin: Secondary | ICD-10-CM | POA: Diagnosis not present

## 2019-07-10 DIAGNOSIS — Z79899 Other long term (current) drug therapy: Secondary | ICD-10-CM | POA: Diagnosis not present

## 2019-07-10 DIAGNOSIS — F0390 Unspecified dementia without behavioral disturbance: Secondary | ICD-10-CM | POA: Insufficient documentation

## 2019-07-10 DIAGNOSIS — Z7982 Long term (current) use of aspirin: Secondary | ICD-10-CM | POA: Diagnosis not present

## 2019-07-10 DIAGNOSIS — Z539 Procedure and treatment not carried out, unspecified reason: Secondary | ICD-10-CM

## 2019-07-10 DIAGNOSIS — J189 Pneumonia, unspecified organism: Secondary | ICD-10-CM | POA: Diagnosis not present

## 2019-07-10 DIAGNOSIS — J439 Emphysema, unspecified: Secondary | ICD-10-CM | POA: Insufficient documentation

## 2019-07-10 DIAGNOSIS — R911 Solitary pulmonary nodule: Secondary | ICD-10-CM | POA: Diagnosis present

## 2019-07-10 DIAGNOSIS — K219 Gastro-esophageal reflux disease without esophagitis: Secondary | ICD-10-CM | POA: Insufficient documentation

## 2019-07-10 DIAGNOSIS — R222 Localized swelling, mass and lump, trunk: Secondary | ICD-10-CM | POA: Diagnosis not present

## 2019-07-10 DIAGNOSIS — E1122 Type 2 diabetes mellitus with diabetic chronic kidney disease: Secondary | ICD-10-CM | POA: Insufficient documentation

## 2019-07-10 DIAGNOSIS — I129 Hypertensive chronic kidney disease with stage 1 through stage 4 chronic kidney disease, or unspecified chronic kidney disease: Secondary | ICD-10-CM | POA: Insufficient documentation

## 2019-07-10 HISTORY — PX: VIDEO BRONCHOSCOPY WITH ENDOBRONCHIAL NAVIGATION: SHX6175

## 2019-07-10 LAB — CBC
HCT: 32.7 % — ABNORMAL LOW (ref 39.0–52.0)
Hemoglobin: 10.7 g/dL — ABNORMAL LOW (ref 13.0–17.0)
MCH: 30.7 pg (ref 26.0–34.0)
MCHC: 32.7 g/dL (ref 30.0–36.0)
MCV: 93.7 fL (ref 80.0–100.0)
Platelets: 284 10*3/uL (ref 150–400)
RBC: 3.49 MIL/uL — ABNORMAL LOW (ref 4.22–5.81)
RDW: 13 % (ref 11.5–15.5)
WBC: 7.6 10*3/uL (ref 4.0–10.5)
nRBC: 0 % (ref 0.0–0.2)

## 2019-07-10 LAB — GLUCOSE, CAPILLARY
Glucose-Capillary: 37 mg/dL — CL (ref 70–99)
Glucose-Capillary: 90 mg/dL (ref 70–99)
Glucose-Capillary: 64 mg/dL — ABNORMAL LOW (ref 70–99)
Glucose-Capillary: 137 mg/dL — ABNORMAL HIGH (ref 70–99)

## 2019-07-10 LAB — COMPREHENSIVE METABOLIC PANEL
ALT: 11 U/L (ref 0–44)
AST: 15 U/L (ref 15–41)
Albumin: 4.1 g/dL (ref 3.5–5.0)
Alkaline Phosphatase: 113 U/L (ref 38–126)
Anion gap: 9 (ref 5–15)
BUN: 19 mg/dL (ref 8–23)
CO2: 24 mmol/L (ref 22–32)
Calcium: 9.8 mg/dL (ref 8.9–10.3)
Chloride: 108 mmol/L (ref 98–111)
Creatinine, Ser: 1.28 mg/dL — ABNORMAL HIGH (ref 0.61–1.24)
GFR calc Af Amer: >60 mL/min (ref >60)
GFR calc non Af Amer: 55 mL/min — ABNORMAL LOW (ref >60)
Glucose, Bld: 42 mg/dL — CL (ref 70–99)
Potassium: 2.8 mmol/L — ABNORMAL LOW (ref 3.5–5.1)
Sodium: 141 mmol/L (ref 135–145)
Total Bilirubin: 0.5 mg/dL (ref 0.3–1.2)
Total Protein: 7.1 g/dL (ref 6.5–8.1)

## 2019-07-10 LAB — APTT: aPTT: 28 seconds (ref 24–36)

## 2019-07-10 LAB — PROTIME-INR
INR: 1 (ref 0.8–1.2)
Prothrombin Time: 13.2 seconds (ref 11.4–15.2)

## 2019-07-10 SURGERY — VIDEO BRONCHOSCOPY WITH ENDOBRONCHIAL NAVIGATION
Anesthesia: General

## 2019-07-10 MED ORDER — DEXTROSE 50 % IV SOLN
INTRAVENOUS | Status: AC
  Administered 2019-07-10: 25 mL via INTRAVENOUS
  Filled 2019-07-10: qty 50

## 2019-07-10 MED ORDER — ONDANSETRON HCL 4 MG/2ML IJ SOLN
INTRAMUSCULAR | Status: AC
  Filled 2019-07-10: qty 2

## 2019-07-10 MED ORDER — PHENYLEPHRINE 40 MCG/ML (10ML) SYRINGE FOR IV PUSH (FOR BLOOD PRESSURE SUPPORT)
PREFILLED_SYRINGE | INTRAVENOUS | Status: DC | PRN
  Administered 2019-07-10: 80 ug via INTRAVENOUS

## 2019-07-10 MED ORDER — PROPOFOL 10 MG/ML IV BOLUS
INTRAVENOUS | Status: AC
  Filled 2019-07-10: qty 40

## 2019-07-10 MED ORDER — DEXTROSE 50 % IV SOLN
25.0000 mL | Freq: Once | INTRAVENOUS | Status: AC
  Administered 2019-07-10: 25 mL via INTRAVENOUS

## 2019-07-10 MED ORDER — DEXTROSE 50 % IV SOLN
INTRAVENOUS | Status: AC
  Filled 2019-07-10: qty 50

## 2019-07-10 MED ORDER — FENTANYL CITRATE (PF) 250 MCG/5ML IJ SOLN
INTRAMUSCULAR | Status: AC
  Filled 2019-07-10: qty 5

## 2019-07-10 MED ORDER — ONDANSETRON HCL 4 MG/2ML IJ SOLN: 4.0000 mg | Freq: Once | INTRAMUSCULAR | Status: DC | PRN

## 2019-07-10 MED ORDER — LACTATED RINGERS IV SOLN: INTRAVENOUS | Status: DC | PRN

## 2019-07-10 MED ORDER — DEXTROSE 50 % IV SOLN
25.0000 mL | Freq: Once | INTRAVENOUS | Status: AC
  Filled 2019-07-10: qty 50

## 2019-07-10 MED ORDER — LIDOCAINE 2% (20 MG/ML) 5 ML SYRINGE
INTRAMUSCULAR | Status: DC | PRN
  Administered 2019-07-10: 40 mg via INTRAVENOUS

## 2019-07-10 MED ORDER — SUCCINYLCHOLINE CHLORIDE 200 MG/10ML IV SOSY
PREFILLED_SYRINGE | INTRAVENOUS | Status: AC
  Filled 2019-07-10: qty 10

## 2019-07-10 MED ORDER — DEXAMETHASONE SODIUM PHOSPHATE 10 MG/ML IJ SOLN
INTRAMUSCULAR | Status: DC | PRN
  Administered 2019-07-10: 5 mg via INTRAVENOUS

## 2019-07-10 MED ORDER — DEXAMETHASONE SODIUM PHOSPHATE 10 MG/ML IJ SOLN
INTRAMUSCULAR | Status: AC
  Filled 2019-07-10: qty 1

## 2019-07-10 MED ORDER — ROCURONIUM BROMIDE 10 MG/ML (PF) SYRINGE
PREFILLED_SYRINGE | INTRAVENOUS | Status: DC | PRN
  Administered 2019-07-10: 50 mg via INTRAVENOUS

## 2019-07-10 MED ORDER — PHENYLEPHRINE 40 MCG/ML (10ML) SYRINGE FOR IV PUSH (FOR BLOOD PRESSURE SUPPORT)
PREFILLED_SYRINGE | INTRAVENOUS | Status: AC
  Filled 2019-07-10: qty 10

## 2019-07-10 MED ORDER — ONDANSETRON HCL 4 MG/2ML IJ SOLN
INTRAMUSCULAR | Status: DC | PRN
  Administered 2019-07-10: 4 mg via INTRAVENOUS

## 2019-07-10 MED ORDER — PHENYLEPHRINE HCL-NACL 10-0.9 MG/250ML-% IV SOLN
INTRAVENOUS | Status: DC | PRN
  Administered 2019-07-10: 25 ug/min via INTRAVENOUS

## 2019-07-10 MED ORDER — CHLORHEXIDINE GLUCONATE 0.12 % MT SOLN
15.0000 mL | Freq: Once | OROMUCOSAL | Status: AC
  Administered 2019-07-10: 15 mL via OROMUCOSAL
  Filled 2019-07-10: qty 15

## 2019-07-10 MED ORDER — FENTANYL CITRATE (PF) 250 MCG/5ML IJ SOLN
INTRAMUSCULAR | Status: DC | PRN
  Administered 2019-07-10 (×2): 50 ug via INTRAVENOUS

## 2019-07-10 MED ORDER — SUCCINYLCHOLINE CHLORIDE 200 MG/10ML IV SOSY
PREFILLED_SYRINGE | INTRAVENOUS | Status: DC | PRN
  Administered 2019-07-10: 100 mg via INTRAVENOUS

## 2019-07-10 MED ORDER — ORAL CARE MOUTH RINSE: 15.0000 mL | Freq: Once | OROMUCOSAL | Status: AC

## 2019-07-10 MED ORDER — PROPOFOL 10 MG/ML IV BOLUS
INTRAVENOUS | Status: DC | PRN
  Administered 2019-07-10: 100 mg via INTRAVENOUS

## 2019-07-10 MED ORDER — SUGAMMADEX SODIUM 200 MG/2ML IV SOLN
INTRAVENOUS | Status: DC | PRN
  Administered 2019-07-10: 200 mg via INTRAVENOUS

## 2019-07-10 MED ORDER — 0.9 % SODIUM CHLORIDE (POUR BTL) OPTIME
TOPICAL | Status: DC | PRN
  Administered 2019-07-10: 1000 mL

## 2019-07-10 MED ORDER — ROCURONIUM BROMIDE 10 MG/ML (PF) SYRINGE
PREFILLED_SYRINGE | INTRAVENOUS | Status: AC
  Filled 2019-07-10: qty 10

## 2019-07-10 MED ORDER — LIDOCAINE 2% (20 MG/ML) 5 ML SYRINGE
INTRAMUSCULAR | Status: AC
  Filled 2019-07-10: qty 5

## 2019-07-10 MED ORDER — FENTANYL CITRATE (PF) 100 MCG/2ML IJ SOLN: 25.0000 ug | INTRAMUSCULAR | Status: DC | PRN

## 2019-07-10 SURGICAL SUPPLY — 43 items
ADAPTER BRONCHOSCOPE OLYMPUS (ADAPTER) ×2 IMPLANT
ADAPTER VALVE BIOPSY EBUS (MISCELLANEOUS) IMPLANT
ADPR BSCP OLMPS EDG (ADAPTER) ×1
ADPTR VALVE BIOPSY EBUS (MISCELLANEOUS)
BLADE CLIPPER SURG (BLADE) ×2 IMPLANT
BRUSH BIOPSY BRONCH 10 SDTNB (MISCELLANEOUS) ×1 IMPLANT
BRUSH SUPERTRAX BIOPSY (INSTRUMENTS) ×1 IMPLANT
BRUSH SUPERTRAX NDL-TIP CYTO (INSTRUMENTS) ×3 IMPLANT
CANISTER SUCT 3000ML PPV (MISCELLANEOUS) ×2 IMPLANT
CNTNR URN SCR LID CUP LEK RST (MISCELLANEOUS) ×2 IMPLANT
CONT SPEC 4OZ STRL OR WHT (MISCELLANEOUS) ×12
COVER BACK TABLE 60X90IN (DRAPES) ×2 IMPLANT
FILTER STRAW FLUID ASPIR (MISCELLANEOUS) IMPLANT
FORCEPS BIOP SUPERTRX PREMAR (INSTRUMENTS) IMPLANT
GAUZE SPONGE 4X4 12PLY STRL (GAUZE/BANDAGES/DRESSINGS) ×2 IMPLANT
GLOVE SURG SIGNA 7.5 PF LTX (GLOVE) ×2 IMPLANT
GOWN STRL REUS W/ TWL XL LVL3 (GOWN DISPOSABLE) ×1 IMPLANT
GOWN STRL REUS W/TWL XL LVL3 (GOWN DISPOSABLE) ×2
KIT CLEAN ENDO COMPLIANCE (KITS) ×2 IMPLANT
KIT ILLUMISITE 180 PROCEDURE (KITS) ×1 IMPLANT
KIT ILLUMISITE 90 PROCEDURE (KITS) IMPLANT
KIT TURNOVER KIT B (KITS) ×2 IMPLANT
MARKER SKIN DUAL TIP RULER LAB (MISCELLANEOUS) ×2 IMPLANT
NDL SUPERTRX PREMARK BIOPSY (NEEDLE) IMPLANT
NEEDLE SUPERTRX PREMARK BIOPSY (NEEDLE) ×2 IMPLANT
NS IRRIG 1000ML POUR BTL (IV SOLUTION) ×2 IMPLANT
OIL SILICONE PENTAX (PARTS (SERVICE/REPAIRS)) ×2 IMPLANT
PAD ARMBOARD 7.5X6 YLW CONV (MISCELLANEOUS) ×4 IMPLANT
PATCHES PATIENT (LABEL) ×6 IMPLANT
SYR 20ML ECCENTRIC (SYRINGE) ×2 IMPLANT
SYR 20ML LL LF (SYRINGE) ×3 IMPLANT
SYR 30ML LL (SYRINGE) ×2 IMPLANT
SYR 50ML LL SCALE MARK (SYRINGE) ×1 IMPLANT
SYR 5ML LL (SYRINGE) ×2 IMPLANT
TOWEL GREEN STERILE (TOWEL DISPOSABLE) ×1 IMPLANT
TOWEL GREEN STERILE FF (TOWEL DISPOSABLE) ×2 IMPLANT
TRAP SPECIMEN MUCUS 40CC (MISCELLANEOUS) ×2 IMPLANT
TUBE CONNECTING 20X1/4 (TUBING) ×4 IMPLANT
UNDERPAD 30X36 HEAVY ABSORB (UNDERPADS AND DIAPERS) ×2 IMPLANT
VALVE BIOPSY  SINGLE USE (MISCELLANEOUS) ×2
VALVE BIOPSY SINGLE USE (MISCELLANEOUS) ×1 IMPLANT
VALVE SUCTION BRONCHIO DISP (MISCELLANEOUS) ×2 IMPLANT
WATER STERILE IRR 1000ML POUR (IV SOLUTION) ×2 IMPLANT

## 2019-07-10 NOTE — Transfer of Care (Signed)
Immediate Anesthesia Transfer of Care Note  Patient: Allen Yu  Procedure(s) Performed: VIDEO BRONCHOSCOPY WITH ENDOBRONCHIAL NAVIGATION (N/A )  Patient Location: PACU  Anesthesia Type:General  Level of Consciousness: drowsy and patient cooperative  Airway & Oxygen Therapy: Patient Spontanous Breathing and Patient connected to face mask oxygen  Post-op Assessment: Report given to RN and Post -op Vital signs reviewed and stable  Post vital signs: Reviewed and stable  Last Vitals:  Vitals Value Taken Time  BP 137/54 07/10/19 0918  Temp    Pulse 75 07/10/19 0920  Resp 15 07/10/19 0920  SpO2 100 % 07/10/19 0920  Vitals shown include unvalidated device data.  Last Pain:  Vitals:   07/10/19 0644  TempSrc: Oral      Patients Stated Pain Goal: 3 (85/92/92 4462)  Complications: No complications documented.

## 2019-07-10 NOTE — Anesthesia Procedure Notes (Signed)
Procedure Name: Intubation Date/Time: 07/10/2019 7:53 AM Performed by: Kathryne Hitch, CRNA Pre-anesthesia Checklist: Patient identified, Emergency Drugs available, Suction available and Patient being monitored Patient Re-evaluated:Patient Re-evaluated prior to induction Oxygen Delivery Method: Circle system utilized Preoxygenation: Pre-oxygenation with 100% oxygen Induction Type: IV induction Ventilation: Mask ventilation without difficulty Laryngoscope Size: Miller and 3 Grade View: Grade I Tube type: Oral Tube size: 8.5 mm Number of attempts: 1 Airway Equipment and Method: Stylet and Oral airway Placement Confirmation: ETT inserted through vocal cords under direct vision,  positive ETCO2 and breath sounds checked- equal and bilateral Secured at: 23 cm Tube secured with: Tape Dental Injury: Teeth and Oropharynx as per pre-operative assessment

## 2019-07-10 NOTE — Progress Notes (Addendum)
Pt with CBG of 37. Verbal order for 54mL of d50 per Dr. Therisa Doyne.  0650 2mL given IV push.  0708 CBG 90. Dr. Fransisco Beau aware of results

## 2019-07-10 NOTE — Brief Op Note (Signed)
07/10/2019  9:25 AM  PATIENT:  Allen Yu  74 y.o. male  PRE-OPERATIVE DIAGNOSIS:  RIGHT UPPER LOBE LUNG MASS  POST-OPERATIVE DIAGNOSIS:  RIGHT UPPER LOBE LUNG MASS  PROCEDURE:  Procedure(s): VIDEO BRONCHOSCOPY WITH ENDOBRONCHIAL NAVIGATION (N/A) Needle aspirations, brushings and transbronchial biopsies  SURGEON:  Surgeon(s) and Role:    * Melrose Nakayama, MD - Primary  PHYSICIAN ASSISTANT:   ASSISTANTS: none   ANESTHESIA:   general  EBL:  10 mL   BLOOD ADMINISTERED:none  DRAINS: none   LOCAL MEDICATIONS USED:  NONE  SPECIMEN:  Source of Specimen:  RUL mass  DISPOSITION OF SPECIMEN:  Path and micro  COUNTS:  NO endoscopic  TOURNIQUET:  * No tourniquets in log *  DICTATION: .Other Dictation: Dictation Number -  PLAN OF CARE: Discharge to home after PACU  PATIENT DISPOSITION:  PACU - hemodynamically stable.   Delay start of Pharmacological VTE agent (>24hrs) due to surgical blood loss or risk of bleeding: not applicable

## 2019-07-10 NOTE — Op Note (Signed)
NAME: Allen Yu, AGE MEDICAL RECORD [id number] ACCOUNT [id number] DATE OF BIRTH:03/15/1945 FACILITY: [facility] LOCATION: [facility]PER[facility] PHYSICIAN:Kate Larock Chaya Jan, MD  OPERATIVE REPORT  DATE OF PROCEDURE:  07/10/2019  PREOPERATIVE DIAGNOSIS:  Cavitary mass, right upper lobe.  POSTOPERATIVE DIAGNOSIS:  Cavitary mass, right upper lobe.  PROCEDURE:  Electromagnetic navigational bronchoscopy with needle aspirations, brushings and transbronchial biopsies.  SURGEON:  Modesto Charon, MD  ASSISTANT:  None  ANESTHESIA:  General.  FINDINGS:  Reactive changes seen on quick preps; no definite tumor seen.  CLINICAL NOTE:  The patient is a 74 year old man with a complex medical history who has had waxing and waning lung nodules over the years.  Recently, he returned with a followup CT which showed a right upper lobe cavitary mass that had increased in size  and had increased wall thickness.  He was advised to undergo navigational bronchoscopy for diagnostic purposes.  The indications, risks, benefits, and alternatives were discussed in detail with the patient.  He understood and accepted the risks and  agreed to proceed.  DESCRIPTION OF PROCEDURE:  [name] was brought to the operating room on 07/10/2019.  He had induction of general anesthesia and was intubated.  A timeout was performed.  Flexible fiberoptic bronchoscopy was performed via the endotracheal tube.  It  revealed normal endobronchial anatomy with no endobronchial lesions to the level of the subsegmental bronchi.  There were some thick clear secretions, which were cleared with saline.  Locatable guide for navigation was placed and registration was performed.  The bronchoscope then was directed to the right upper lobe bronchus and the appropriate segmental and subsegmental bronchi were cannulated with the locatable guide, which was  advanced to within a centimeter of the center of the lesion with good alignment.  Sampling then was  performed from 3 different angles on the mass.  At each site needle aspirations, brushings with both a needle brush and a triple brush and transbronchial  biopsies were performed.  Fluoroscopy was used for all sampling.  After sampling from 2 sites local registration was performed and a 3rd site was sampled.  All appeared to have a good alignment and the samples all appeared to be taken from the mass based  on the fluoroscopy images.  Quick preps on the lesions showed reactive changes in 2 sites and benign tissue in the 3rd.  Several biopsies were taken for AFB and fungal cultures, but the majority were sent for pathology.  The locatable guide and sheath  were removed.  Final inspection was made with the bronchoscope.  There was no ongoing bleeding.  The total fluoroscopy time was 5.1 minutes.  The total dose was 48 milligray.  The patient was extubated in the operating room and taken to the postanesthetic  care unit in good condition.  CN/NUANCE  D:07/10/2019 T:07/10/2019 JOB:[id number]

## 2019-07-10 NOTE — Interval H&P Note (Signed)
History and Physical Interval Note: No PET CT yet. Will proceed with biopsy and do PET if positive  07/10/2019 7:16 AM  Allen Yu  has presented today for surgery, with the diagnosis of RUL NODULE.  The various methods of treatment have been discussed with the patient and family. After consideration of risks, benefits and other options for treatment, the patient has consented to  Procedure(s): [place] (N/A) as a surgical intervention.  The patient's history has been reviewed, patient examined, no change in status, stable for surgery.  I have reviewed the patient's chart and labs.  Questions were answered to the patient's satisfaction.     Melrose Nakayama

## 2019-07-10 NOTE — Anesthesia Postprocedure Evaluation (Signed)
Anesthesia Post Note  Patient: Allen Yu  Procedure(s) Performed: VIDEO BRONCHOSCOPY WITH ENDOBRONCHIAL NAVIGATION (N/A )     Patient location during evaluation: PACU Anesthesia Type: General Level of consciousness: awake and alert Pain management: pain level controlled Vital Signs Assessment: post-procedure vital signs reviewed and stable Respiratory status: spontaneous breathing, nonlabored ventilation and respiratory function stable Cardiovascular status: blood pressure returned to baseline and stable Postop Assessment: no apparent nausea or vomiting Anesthetic complications: no   No complications documented.  Last Vitals:  Vitals:   07/10/19 0933 07/10/19 0948  BP: (!) 148/73 140/74  Pulse: 78 73  Resp: 16 15  Temp:  (!) 36.2 C  SpO2: 100% 99%    Last Pain:  Vitals:   07/10/19 0644  TempSrc: Oral                 Audry Pili

## 2019-07-10 NOTE — OR Nursing (Signed)
Lab called at 8:05 into OR 10 with critical CBG value of 42 value from 0702 this morning . Informed CRNA, Jerene Pitch and she was a;ready aware and stated they already treated the hypoglycemia before coming back to OR.

## 2019-07-11 ENCOUNTER — Encounter (HOSPITAL_COMMUNITY): Payer: Self-pay | Admitting: Thoracic Surgery (Cardiothoracic Vascular Surgery)

## 2019-07-11 LAB — ACID FAST SMEAR (AFB, MYCOBACTERIA): Acid Fast Smear: NEGATIVE

## 2019-07-13 ENCOUNTER — Other Ambulatory Visit: Payer: Self-pay

## 2019-07-13 LAB — CYTOLOGY - NON PAP

## 2019-07-14 LAB — QUANTIFERON-TB GOLD PLUS (RQFGPL)
QuantiFERON Mitogen Value: 3 IU/mL
QuantiFERON Nil Value: 0 IU/mL
QuantiFERON TB1 Ag Value: 1.43 IU/mL
QuantiFERON TB2 Ag Value: 1.45 IU/mL

## 2019-07-14 LAB — QUANTIFERON-TB GOLD PLUS: QuantiFERON-TB Gold Plus: POSITIVE — AB

## 2019-07-14 LAB — SURGICAL PATHOLOGY

## 2019-07-15 LAB — AEROBIC/ANAEROBIC CULTURE W GRAM STAIN (SURGICAL/DEEP WOUND): Culture: NO GROWTH

## 2019-07-16 ENCOUNTER — Other Ambulatory Visit: Payer: Self-pay

## 2019-07-16 ENCOUNTER — Telehealth (INDEPENDENT_AMBULATORY_CARE_PROVIDER_SITE_OTHER): Payer: Medicare Other | Admitting: Thoracic Surgery (Cardiothoracic Vascular Surgery)

## 2019-07-16 DIAGNOSIS — R911 Solitary pulmonary nodule: Secondary | ICD-10-CM | POA: Diagnosis not present

## 2019-07-16 NOTE — Progress Notes (Addendum)
[address]       [place] [address]             [phone]      This visit is conducted via telephone due to possible tuberculosis.  Identified [name] with 2 identifiers and spoke to both him and his wife (with his permission).   Allen Yu is a 74 year old man with a history of tobacco abuse, lung nodules, schizophrenia, PTSD, chronic kidney disease, insulin-dependent diabetes, hypertension, reflux, and peripheral neuropathy.  He has had waxing and waning lung nodules over the years dating back to 2017.  In September 2019 he had a new groundglass opacity in the right lower lobe.  In October 2020 there was no change to the groundglass opacity but he had a new cavitary right upper lobe lesion.  I saw him again in June.  His right upper lobe cavitary nodule had enlarged dramatically.  This was really more suspicious for infection than neoplasm.  I recommended a bronchoscopy for diagnostic purposes.  We also checked a QuantiFERON gold.  Allen Yu tolerated the bronchoscopy well and did not have any issues afterwards.  Past Medical History:  Diagnosis Date  . Anemia   . Arthritis   . Carotid stenosis   . Chronic back pain   . Chronic kidney disease    stones hx  . Dementia ([place])   . Diabetes mellitus   . GERD (gastroesophageal reflux disease)   . Hypertension   . Lung nodule   . Peripheral neuropathy   . PTSD (post-traumatic stress disorder)   . Schizophrenia ([place])   . Tuberculosis    MED TX X 1 YEAR YEARS AGO     Current Outpatient Medications  Medication Sig Dispense Refill  . acetaminophen (TYLENOL) 500 MG tablet Take 1,000 mg by mouth 3 (three) times daily.    [name] aspirin EC 81 MG tablet Take 81 mg by mouth at bedtime.     . Aspirin-Caffeine (BC FAST PAIN RELIEF PO) Take 1 packet by mouth daily as needed (pain).    [name] aspirin-sod bicarb-citric acid (ALKA-SELTZER) 325 MG TBEF tablet Take 650 mg by mouth every 6 (six) hours as needed  (indigestion).    [name] atorvastatin (LIPITOR) 40 MG tablet Take 40 mg by mouth at bedtime.   4  . donepezil (ARICEPT) 10 MG tablet Take 10 mg by mouth at bedtime.     . insulin lispro (HUMALOG) 100 UNIT/ML injection Inject 5-10 Units into the skin daily as needed for high blood sugar.     [name] LANTUS 100 UNIT/ML injection Inject 60 Units into the skin at bedtime.     [name] losartan (COZAAR) 50 MG tablet Take 50 mg by mouth at bedtime.      No current facility-administered medications for this visit.    Physical Exam Telephone visit  Diagnostic Tests: FINAL MICROSCOPIC DIAGNOSIS:   A. LUNG, RIGHT UPPER LOBE, BIOPSY:  - Necrotizing granulomatous inflammation. 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 GMS special stain is negative for fungal organisms.    Ref Range & Units 6 d ago  QuantiFERON Incubation  Incubation performed.   QuantiFERON-TB Gold Plus Negative PositiveAbnormal      Impression: Allen Yu is a 74 year old man with a history of tobacco abuse, lung nodules, schizophrenia, PTSD, chronic kidney disease, insulin-dependent diabetes, hypertension, reflux, and peripheral neuropathy.  He has had waxing and waning lung nodules for about 4 years now.  Recently he had a marked increase in size of a cavitary right upper lobe lesion over a relatively short period of time.  Bronchoscopy showed necrotizing granulomas.  Stains were positive for AFB and QuantiFERON gold assay was positive as well.  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  Given the rapid growth of the cavitary lung nodule treatment is indicated.  I spoke with Allen Yu and his wife on the phone.  I informed him of the results.  I informed him that we would arrange an appointment with infectious disease to determine appropriate treatment.  I will need to see him back in about a year  with a CT to check on his other nodules.  Plan: Refer to infectious disease for treatment of mycobacterial infection Return to see me in 1 year with a chest CT Melrose Nakayama, MD [facility] [phone]  Patient location Home Provider location office 30minutes spent in review of records, results, and on the telephone with Mr. and [name]

## 2019-08-06 DIAGNOSIS — Z79899 Other long term (current) drug therapy: Secondary | ICD-10-CM | POA: Diagnosis not present

## 2019-08-06 DIAGNOSIS — E1129 Type 2 diabetes mellitus with other diabetic kidney complication: Secondary | ICD-10-CM | POA: Diagnosis not present

## 2019-08-06 DIAGNOSIS — E785 Hyperlipidemia, unspecified: Secondary | ICD-10-CM | POA: Diagnosis not present

## 2019-08-06 DIAGNOSIS — N183 Chronic kidney disease, stage 3 unspecified: Secondary | ICD-10-CM | POA: Diagnosis not present

## 2019-08-07 LAB — FUNGAL ORGANISM REFLEX

## 2019-08-07 LAB — FUNGUS CULTURE RESULT

## 2019-08-07 LAB — FUNGUS CULTURE WITH STAIN

## 2019-08-14 DIAGNOSIS — N1831 Chronic kidney disease, stage 3a: Secondary | ICD-10-CM | POA: Diagnosis not present

## 2019-08-14 DIAGNOSIS — E1122 Type 2 diabetes mellitus with diabetic chronic kidney disease: Secondary | ICD-10-CM | POA: Diagnosis not present

## 2019-08-14 DIAGNOSIS — R911 Solitary pulmonary nodule: Secondary | ICD-10-CM | POA: Diagnosis not present

## 2019-08-24 LAB — ACID FAST CULTURE WITH REFLEXED SENSITIVITIES (MYCOBACTERIA): Acid Fast Culture: NEGATIVE

## 2019-09-14 ENCOUNTER — Encounter: Payer: Self-pay | Admitting: Internal Medicine

## 2019-09-14 ENCOUNTER — Ambulatory Visit (INDEPENDENT_AMBULATORY_CARE_PROVIDER_SITE_OTHER): Payer: Medicare Other | Admitting: Internal Medicine

## 2019-09-14 ENCOUNTER — Other Ambulatory Visit: Payer: Self-pay

## 2019-09-14 DIAGNOSIS — J984 Other disorders of lung: Secondary | ICD-10-CM

## 2019-09-14 DIAGNOSIS — J189 Pneumonia, unspecified organism: Secondary | ICD-10-CM | POA: Diagnosis not present

## 2019-09-14 DIAGNOSIS — R634 Abnormal weight loss: Secondary | ICD-10-CM | POA: Diagnosis not present

## 2019-09-14 DIAGNOSIS — Z8611 Personal history of tuberculosis: Secondary | ICD-10-CM | POA: Diagnosis not present

## 2019-09-14 DIAGNOSIS — Z8615 Personal history of latent tuberculosis infection: Secondary | ICD-10-CM | POA: Insufficient documentation

## 2019-09-14 LAB — COMPREHENSIVE METABOLIC PANEL
AG Ratio: 1.7 (calc) (ref 1.0–2.5)
ALT: 7 U/L — ABNORMAL LOW (ref 9–46)
AST: 14 U/L (ref 10–35)
Albumin: 4 g/dL (ref 3.6–5.1)
Alkaline phosphatase (APISO): 101 U/L (ref 35–144)
BUN/Creatinine Ratio: 15 (calc) (ref 6–22)
BUN: 21 mg/dL (ref 7–25)
CO2: 27 mmol/L (ref 20–32)
Calcium: 9.3 mg/dL (ref 8.6–10.3)
Chloride: 103 mmol/L (ref 98–110)
Creat: 1.4 mg/dL — ABNORMAL HIGH (ref 0.70–1.18)
Globulin: 2.4 g/dL (calc) (ref 1.9–3.7)
Glucose, Bld: 210 mg/dL — ABNORMAL HIGH (ref 65–99)
Potassium: 4.8 mmol/L (ref 3.5–5.3)
Sodium: 136 mmol/L (ref 135–146)
Total Bilirubin: 0.9 mg/dL (ref 0.2–1.2)
Total Protein: 6.4 g/dL (ref 6.1–8.1)

## 2019-09-14 LAB — CBC
HCT: 28 % — ABNORMAL LOW (ref 38.5–50.0)
Hemoglobin: 9.4 g/dL — ABNORMAL LOW (ref 13.2–17.1)
MCH: 31.1 pg (ref 27.0–33.0)
MCHC: 33.6 g/dL (ref 32.0–36.0)
MCV: 92.7 fL (ref 80.0–100.0)
MPV: 9.7 fL (ref 7.5–12.5)
Platelets: 240 10*3/uL (ref 140–400)
RBC: 3.02 10*6/uL — ABNORMAL LOW (ref 4.20–5.80)
RDW: 13.2 % (ref 11.0–15.0)
WBC: 4.9 10*3/uL (ref 3.8–10.8)

## 2019-09-14 NOTE — Progress Notes (Addendum)
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 Merilynn Finland  Assessment: Although he does not have classic symptoms of fever, cough and sputum production I am certainly concerned about recurrent TB pneumonia.  I have spoken with Imagene Sheller, [facility] TB nurse.  She recalls treating him is going to track down records for me.  He is now living in [place].  Once I have records from [facility] I will try to contact the [facility] TB nurse to discuss possible retreatment to see if he gets better and his cavitary lesion stops enlarging.  Addendum: I was able to obtain records from the [facility] TB nurse. 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 He had a positive TB skin test with 20 mm of induration. His chest x-ray did not reveal any active pneumonia but did show calcified mediastinal lymph nodes. He was treated with isoniazid and vitamin B6 for 9 months through July 2007. Despite previous treatment for latent tuberculosis I am still very concerned about the possibility of active TB pneumonia now causing his new cavitary lung lesion. I have discussed the situation with the [facility] TB nurse [phone]). She will reach out to him and discuss the possibility of treatment for active TB. I will see Mr. Billey back in early October.  Plan: 1. Obtain records from [facility] 2. Discuss case with the [facility] TB nurse 3. Follow-up here in 6 weeks  Patient Active Problem List   Diagnosis Date Noted  . Cavitary pneumonia 09/14/2019    Priority: High  . History of active tuberculosis 09/14/2019    Priority: High  . Unintentional weight loss 09/14/2019    Priority: High  . Coronary artery calcification seen on CT scan 10/28/2018  .  Hilar adenopathy 04/10/2016  . Lung nodule seen on imaging study 07/09/2015  . Acute encephalopathy   . Hypoglycemia due to insulin   . Hypertension 07/08/2015  . Insulin dependent diabetes mellitus 07/08/2015  . AKI (acute kidney injury) ([place]) 07/08/2015  . Normocytic anemia 07/08/2015  . Tobacco abuse 07/08/2015  . Diverticulosis of colon without hemorrhage   . Left inguinal hernia 09/16/2013  . Altered mental status 11/17/2012  . Hypothermia 11/17/2012  . Leukocytosis 11/17/2012  . CKD (chronic kidney disease), stage II   . PTSD (post-traumatic stress disorder)   . GERD (gastroesophageal reflux disease)     Patient's Medications  New Prescriptions   No medications on file  Previous Medications   ACETAMINOPHEN (TYLENOL) 500 MG TABLET    Take 1,000 mg by mouth 3 (three) times daily.   ASPIRIN EC 81 MG TABLET    Take 81 mg by mouth at bedtime.    ASPIRIN-CAFFEINE (BC FAST PAIN RELIEF PO)    Take 1 packet by mouth daily as needed (pain).   ASPIRIN-SOD BICARB-CITRIC ACID (ALKA-SELTZER) 325 MG TBEF TABLET    Take 650 mg by mouth every 6 (six) hours as needed (indigestion).   ATORVASTATIN (LIPITOR) 40 MG TABLET    Take 40 mg by mouth at bedtime.    DONEPEZIL (ARICEPT) 10 MG TABLET    Take 10 mg by mouth at bedtime.    INSULIN LISPRO (HUMALOG) 100 UNIT/ML INJECTION    Inject 5-10 Units into the skin daily as needed for high blood sugar.  LANTUS 100 UNIT/ML INJECTION    Inject 60 Units into the skin at bedtime.    LOSARTAN (COZAAR) 50 MG TABLET    Take 50 mg by mouth at bedtime.   Modified Medications   No medications on file  Discontinued Medications   No medications on file    HPI: [facility] is a 74 y.o. male cigarette smoker.  He has a history of active tuberculosis treated by the [facility] TB nurses in the late 1990s.  Several years ago he was noted to have a small lung nodule.  On follow-up CT scan and September of last year he had a new cavitary lesion in the right  upper lobe.  Follow-up CT in June the lesion was found to have enlarged dramatically.  He says that he has been losing weight been having night sweats.  He was hospitalized and underwent bronchoscopy with biopsy on 07/10/2019.  Path revealed necrotizing granulomatous inflammation with no evidence of malignancy.  AFB and fungal stains and cultures were negative.  His QuantiFERON TB Gold assay was positive.  Review of Systems: Review of Systems  Constitutional: Positive for malaise/fatigue and weight loss. Negative for chills, diaphoresis and fever.  HENT: Negative for congestion and sore throat.   Respiratory: Positive for shortness of breath. Negative for cough, hemoptysis and sputum production.   Cardiovascular: Negative for chest pain.  Gastrointestinal: Negative for abdominal pain, diarrhea, nausea and vomiting.  Genitourinary: Negative for dysuria.  Musculoskeletal: Positive for back pain, joint pain and myalgias.  Neurological: Negative for weakness.      Past Medical History:  Diagnosis Date  . Anemia   . Arthritis   . Carotid stenosis   . Chronic back pain   . Chronic kidney disease    stones hx  . Dementia ([place])   . Diabetes mellitus   . GERD (gastroesophageal reflux disease)   . Hypertension   . Lung nodule   . Peripheral neuropathy   . PTSD (post-traumatic stress disorder)   . Schizophrenia ([place])   . Tuberculosis    MED TX X 1 YEAR YEARS AGO     Social History   Tobacco Use  . Smoking status: Current Every Day Smoker    Packs/day: 1.00    Types: Cigarettes  . Smokeless tobacco: Never Used  Vaping Use  . Vaping Use: Never used  Substance Use Topics  . Alcohol use: No    Alcohol/week: 0.0 standard drinks  . Drug use: Yes    Types: Marijuana    Comment: daily    Family History  Problem Relation Age of Onset  . Heart disease Mother        before age 2   Allergies  Allergen Reactions  . Bee Venom Anaphylaxis  . Codeine Other (See Comments)     incoherent   . Diovan [Valsartan] Other (See Comments)    incoherent  . Propoxyphene Other (See Comments)    Dizziness, "Makes me feel drunk"    OBJECTIVE: Vitals:   09/14/19 1347  BP: 135/76  Pulse: 77  SpO2: 99%   There is no height or weight on file to calculate BMI.   Physical Exam Constitutional:      Comments: He is seated in his wheelchair.  He is accompanied by his brother.  He is thin and appears weak.  Cardiovascular:     Rate and Rhythm: Normal rate and regular rhythm.     Heart sounds: No murmur heard.   Pulmonary:  Effort: Pulmonary effort is normal.     Breath sounds: Normal breath sounds.  Abdominal:     Palpations: Abdomen is soft.     Tenderness: There is no abdominal tenderness.  Skin:    Findings: No rash.  Neurological:     General: No focal deficit present.  Psychiatric:        Mood and Affect: Mood normal.     Microbiology: No results found for this or any previous visit (from the past 240 hour(s)).  Michel Bickers, MD [facility] for Infectious [place] [facility] [phone] pager   [phone] cell 09/14/2019, 2:20 PM

## 2019-09-23 ENCOUNTER — Telehealth: Payer: Self-pay | Admitting: *Deleted

## 2019-09-23 NOTE — Telephone Encounter (Signed)
[name] from [facility] Department called. She is meeting Mr Sells at 4:00 today to discuss the treatment.  Per [name], the patient was discussed with Dr Lorenda Cahill, who agreed with the treatment plan outlined by Dr Megan Salon.  [name] will be collecting 3 sputum samples for AFB cultures, will be administering the 4 drug regimen. Mr. Vanvoorhis has 3 household contacts that the Health Department will be giving prophylaxis treatment to as well. Per [name], Mr Markus had not checked his voicemail and did not get the message from Dr Megan Salon discussing this next step. [name] asked if [facility] could reach out to the patient again. RN called the patient, it went to voicemail.  Left message letting Mr Eden know that Dr Megan Salon was in agreement with this treatment plan and gave him the phone number to call back with questions. Landis Gandy, RN

## 2019-09-23 NOTE — Telephone Encounter (Signed)
Spoke with patient. Let him know that we did speak with [name] at the health department and were aware of the plan for his treatment. He didn't have any further questions. He did not check his voicemail, was not aware that Dr Megan Salon had left him a message. Landis Gandy, RN

## 2019-10-29 ENCOUNTER — Ambulatory Visit: Payer: Medicare Other | Admitting: Internal Medicine

## 2019-11-06 DIAGNOSIS — E1042 Type 1 diabetes mellitus with diabetic polyneuropathy: Secondary | ICD-10-CM | POA: Diagnosis not present

## 2019-11-18 ENCOUNTER — Telehealth: Payer: Self-pay

## 2019-11-18 NOTE — Telephone Encounter (Signed)
I called and spoke with [name] today.  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  She said that he is feeling better.  She has informed Dr. Brigitte Pulse, [facility] TB director.  I suggested that we get his input before making a decision about stopping or changing his antibiotic therapy.  I am still somewhat concerned about the possibility of culture-negative TB.  I am not sure if Mycobacterium malmoense is a contaminant or pathogenic in this case.

## 2019-11-18 NOTE — Telephone Encounter (Signed)
Received call from [name] at [facility] regarding results and plan of care. States last sputum results came back negative for TB. Would like to know if patient can d/c Tb meds.  Will have results faxed to office. Cheron Every to page MD after lunch if she hears no response.  Will send message to provider.  P: [phone]

## 2019-11-25 ENCOUNTER — Ambulatory Visit: Payer: Medicare Other | Admitting: Internal Medicine

## 2019-11-26 ENCOUNTER — Other Ambulatory Visit: Payer: Self-pay

## 2019-11-26 ENCOUNTER — Encounter: Payer: Self-pay | Admitting: Internal Medicine

## 2019-11-26 ENCOUNTER — Ambulatory Visit (INDEPENDENT_AMBULATORY_CARE_PROVIDER_SITE_OTHER): Payer: Medicare Other | Admitting: Internal Medicine

## 2019-11-26 DIAGNOSIS — J984 Other disorders of lung: Secondary | ICD-10-CM | POA: Diagnosis not present

## 2019-11-26 DIAGNOSIS — J189 Pneumonia, unspecified organism: Secondary | ICD-10-CM | POA: Diagnosis not present

## 2019-11-26 DIAGNOSIS — R197 Diarrhea, unspecified: Secondary | ICD-10-CM

## 2019-11-26 DIAGNOSIS — R634 Abnormal weight loss: Secondary | ICD-10-CM

## 2019-11-26 NOTE — Progress Notes (Signed)
[facility] for Infectious Disease  Patient Active Problem List   Diagnosis Date Noted  . Diarrhea 11/26/2019    Priority: High  . Cavitary pneumonia 09/14/2019    Priority: High  . History of active tuberculosis 09/14/2019    Priority: High  . Unintentional weight loss 09/14/2019    Priority: High  . Coronary artery calcification seen on CT scan 10/28/2018  . Hilar adenopathy 04/10/2016  . Lung nodule seen on imaging study 07/09/2015  . Acute encephalopathy   . Hypoglycemia due to insulin   . Hypertension 07/08/2015  . Insulin dependent diabetes mellitus 07/08/2015  . AKI (acute kidney injury) ([place]) 07/08/2015  . Normocytic anemia 07/08/2015  . Tobacco abuse 07/08/2015  . Diverticulosis of colon without hemorrhage   . Left inguinal hernia 09/16/2013  . Altered mental status 11/17/2012  . Hypothermia 11/17/2012  . Leukocytosis 11/17/2012  . CKD (chronic kidney disease), stage II   . PTSD (post-traumatic stress disorder)   . GERD (gastroesophageal reflux disease)     Patient's Medications  New Prescriptions   No medications on file  Previous Medications   ACETAMINOPHEN (TYLENOL) 500 MG TABLET    Take 1,000 mg by mouth 3 (three) times daily.   ASPIRIN EC 81 MG TABLET    Take 81 mg by mouth at bedtime.    ASPIRIN-CAFFEINE (BC FAST PAIN RELIEF PO)    Take 1 packet by mouth daily as needed (pain).   ASPIRIN-SOD BICARB-CITRIC ACID (ALKA-SELTZER) 325 MG TBEF TABLET    Take 650 mg by mouth every 6 (six) hours as needed (indigestion).   ATORVASTATIN (LIPITOR) 40 MG TABLET    Take 40 mg by mouth at bedtime.    DONEPEZIL (ARICEPT) 10 MG TABLET    Take 10 mg by mouth at bedtime.    ETHAMBUTOL (MYAMBUTOL) 400 MG TABLET    Take 800 mg by mouth daily.   GABAPENTIN (NEURONTIN) 100 MG CAPSULE    Take 100 mg by mouth at bedtime.   INSULIN LISPRO (HUMALOG) 100 UNIT/ML INJECTION    Inject 5-10 Units into the skin daily as needed for high blood sugar.    ISONIAZID (NYDRAZID)  300 MG TABLET    Take 300 mg by mouth daily.   LANTUS 100 UNIT/ML INJECTION    Inject 60 Units into the skin at bedtime.    LOSARTAN (COZAAR) 50 MG TABLET    Take 50 mg by mouth at bedtime.    PYRAZINAMIDE 500 MG TABLET    Take 1,000 mg by mouth daily.   RIFAMPIN (RIFADIN) 300 MG CAPSULE    Take by mouth.   VITAMIN B-6 (PYRIDOXINE) 25 MG TABLET    Take by mouth at bedtime.  Modified Medications   No medications on file  Discontinued Medications   No medications on file    Subjective: [facility] Yu is in for his routine follow-up visit.  At the time of his initial visit with me in August I had concerns that he might have active tuberculosis causing his cavitary lung lesion.  He has a remote history of treatment for latent TB.  However sputum cultures did not grow MTB.  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  His empiric TB therapy was stopped last week.  He had a lot of problems with nausea, vomiting and diarrhea while he was on therapy.  He thinks he may be feeling a little bit better now.  His appetite has been very poor and he is lost a  great deal of weight.  Review of Systems: Review of Systems  Constitutional: Positive for weight loss. Negative for chills, diaphoresis and fever.  Respiratory: Positive for cough, sputum production and shortness of breath.   Cardiovascular: Negative for chest pain.  Gastrointestinal: Positive for abdominal pain, diarrhea, nausea and vomiting.  Musculoskeletal: Positive for joint pain.    Past Medical History:  Diagnosis Date  . Anemia   . Arthritis   . Carotid stenosis   . Chronic back pain   . Chronic kidney disease    stones hx  . Dementia ([place])   . Diabetes mellitus   . GERD (gastroesophageal reflux disease)   . Hypertension   . Lung nodule   . Peripheral neuropathy   . PTSD (post-traumatic stress disorder)   . Schizophrenia ([place])   . Tuberculosis    MED TX X 1 YEAR YEARS AGO     Social History   Tobacco Use  . Smoking status: Current Every  Day Smoker    Packs/day: 1.00    Types: Cigarettes  . Smokeless tobacco: Never Used  Vaping Use  . Vaping Use: Never used  Substance Use Topics  . Alcohol use: No    Alcohol/week: 0.0 standard drinks  . Drug use: Yes    Types: Marijuana    Comment: daily    Family History  Problem Relation Age of Onset  . Heart disease Mother        before age 49    Allergies  Allergen Reactions  . Bee Venom Anaphylaxis  . Codeine Other (See Comments)    incoherent   . Diovan [Valsartan] Other (See Comments)    incoherent  . Propoxyphene Other (See Comments)    Dizziness, "Makes me feel drunk"    Objective: Vitals:   11/26/19 0908  BP: (!) 149/86  Pulse: 89  Temp: 97.8 F (36.6 C)  Weight: 121 lb (54.9 kg)   Body mass index is 15.96 kg/m.  Physical Exam Constitutional:      Comments: He is seated in a wheelchair.  His weight is down 39 pounds since June.  Cardiovascular:     Rate and Rhythm: Normal rate and regular rhythm.     Heart sounds: No murmur heard.      Comments: He has very distant heart sounds. Pulmonary:     Breath sounds: Normal breath sounds. No wheezing, rhonchi or rales.  Abdominal:     Palpations: Abdomen is soft.     Tenderness: There is no abdominal tenderness.  Psychiatric:        Mood and Affect: Mood normal.         Problem List Items Addressed This Visit      High   Unintentional weight loss    His dramatic weight loss has continued.  Some of it may be related to adverse reaction to his empiric TB treatment.  I will see him back in 1 month to see if it improves off of therapy.      Relevant Orders   CBC   Comprehensive metabolic panel   Diarrhea    He will bring a stool specimen back for C. difficile testing.      Relevant Orders   Clostridium Difficile by PCR([facility])   Cavitary pneumonia    He does not appear to have improved on empiric TB therapy and with negative cultures I doubt that his the cause.  Mycobacterium  malmoense is generally considered an insignificant contaminant but I will see him back  in 1 month to reevaluate whether or not this might need treatment.      Relevant Medications   rifampin (RIFADIN) 300 MG capsule   pyrazinamide 500 MG tablet   isoniazid (NYDRAZID) 300 MG tablet   ethambutol (MYAMBUTOL) 400 MG tablet       Michel Bickers, MD [facility] for Infectious [place] [facility] [phone] pager   [phone] cell 11/26/2019, 10:31 AM

## 2019-11-26 NOTE — Assessment & Plan Note (Signed)
He does not appear to have improved on empiric TB therapy and with negative cultures I doubt that his the cause.  Mycobacterium malmoense is generally considered an insignificant contaminant but I will see him back in 1 month to reevaluate whether or not this might need treatment.

## 2019-11-26 NOTE — Assessment & Plan Note (Signed)
He will bring a stool specimen back for C. difficile testing.

## 2019-11-26 NOTE — Assessment & Plan Note (Signed)
His dramatic weight loss has continued.  Some of it may be related to adverse reaction to his empiric TB treatment.  I will see him back in 1 month to see if it improves off of therapy.

## 2019-11-27 LAB — COMPREHENSIVE METABOLIC PANEL
AG Ratio: 1.7 (calc) (ref 1.0–2.5)
ALT: 20 U/L (ref 9–46)
AST: 20 U/L (ref 10–35)
Albumin: 4.1 g/dL (ref 3.6–5.1)
Alkaline phosphatase (APISO): 101 U/L (ref 35–144)
BUN: 12 mg/dL (ref 7–25)
CO2: 27 mmol/L (ref 20–32)
Calcium: 9.4 mg/dL (ref 8.6–10.3)
Chloride: 103 mmol/L (ref 98–110)
Creat: 1.1 mg/dL (ref 0.70–1.18)
Globulin: 2.4 g/dL (calc) (ref 1.9–3.7)
Glucose, Bld: 344 mg/dL — ABNORMAL HIGH (ref 65–99)
Potassium: 5.3 mmol/L (ref 3.5–5.3)
Sodium: 136 mmol/L (ref 135–146)
Total Bilirubin: 0.5 mg/dL (ref 0.2–1.2)
Total Protein: 6.5 g/dL (ref 6.1–8.1)

## 2019-11-27 LAB — CBC
HCT: 30.1 % — ABNORMAL LOW (ref 38.5–50.0)
Hemoglobin: 10.2 g/dL — ABNORMAL LOW (ref 13.2–17.1)
MCH: 31.5 pg (ref 27.0–33.0)
MCHC: 33.9 g/dL (ref 32.0–36.0)
MCV: 92.9 fL (ref 80.0–100.0)
MPV: 9.9 fL (ref 7.5–12.5)
Platelets: 248 10*3/uL (ref 140–400)
RBC: 3.24 10*6/uL — ABNORMAL LOW (ref 4.20–5.80)
RDW: 12.5 % (ref 11.0–15.0)
WBC: 4.3 10*3/uL (ref 3.8–10.8)

## 2019-12-16 ENCOUNTER — Ambulatory Visit: Payer: Medicare Other | Admitting: Internal Medicine

## 2019-12-24 ENCOUNTER — Other Ambulatory Visit: Payer: Self-pay

## 2019-12-24 ENCOUNTER — Ambulatory Visit
Admission: RE | Admit: 2019-12-24 | Discharge: 2019-12-24 | Disposition: A | Discharge status: Home / Self Care | Payer: Medicare Other | Source: Ambulatory Visit | Attending: Internal Medicine | Admitting: Internal Medicine

## 2019-12-24 ENCOUNTER — Ambulatory Visit (INDEPENDENT_AMBULATORY_CARE_PROVIDER_SITE_OTHER): Payer: Medicare Other | Admitting: Internal Medicine

## 2019-12-24 ENCOUNTER — Encounter: Payer: Self-pay | Admitting: Internal Medicine

## 2019-12-24 DIAGNOSIS — J189 Pneumonia, unspecified organism: Secondary | ICD-10-CM

## 2019-12-24 DIAGNOSIS — R918 Other nonspecific abnormal finding of lung field: Secondary | ICD-10-CM | POA: Diagnosis not present

## 2019-12-24 DIAGNOSIS — J984 Other disorders of lung: Secondary | ICD-10-CM | POA: Diagnosis not present

## 2019-12-24 DIAGNOSIS — R634 Abnormal weight loss: Secondary | ICD-10-CM | POA: Diagnosis not present

## 2019-12-24 DIAGNOSIS — Z8701 Personal history of pneumonia (recurrent): Secondary | ICD-10-CM | POA: Diagnosis not present

## 2019-12-24 DIAGNOSIS — I700 Atherosclerosis of aorta: Secondary | ICD-10-CM | POA: Diagnosis not present

## 2019-12-24 DIAGNOSIS — L84 Corns and callosities: Secondary | ICD-10-CM | POA: Diagnosis not present

## 2019-12-24 NOTE — Assessment & Plan Note (Signed)
He says that he is unwilling to go back to see the foot doctor.

## 2019-12-24 NOTE — Assessment & Plan Note (Signed)
I will repeat a chest x-ray today to see if his right upper lobe cavity is enlarging.

## 2019-12-24 NOTE — Assessment & Plan Note (Signed)
His recent weight loss may have been due to adverse side effects of his empiric TB therapy.  It seems to be reversing since his medication was stopped a little over a month ago.

## 2019-12-24 NOTE — Progress Notes (Signed)
[facility] for Infectious Disease  Patient Active Problem List   Diagnosis Date Noted  . Plantar callus 12/24/2019    Priority: High  . Cavitary pneumonia 09/14/2019    Priority: High  . 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    Priority: High  . Unintentional weight loss 09/14/2019    Priority: High  . Coronary artery calcification seen on CT scan 10/28/2018  . Hilar adenopathy 04/10/2016  . Lung nodule seen on imaging study 07/09/2015  . Acute encephalopathy   . Hypoglycemia due to insulin   . Hypertension 07/08/2015  . Insulin dependent diabetes mellitus 07/08/2015  . AKI (acute kidney injury) ([facility] [place]) 07/08/2015  . Normocytic anemia 07/08/2015  . Tobacco abuse 07/08/2015  . Diverticulosis of colon without hemorrhage   . Left inguinal hernia 09/16/2013  . Altered mental status 11/17/2012  . Hypothermia 11/17/2012  . Leukocytosis 11/17/2012  . CKD (chronic kidney disease), stage II   . PTSD (post-traumatic stress disorder)   . GERD (gastroesophageal reflux disease)     Patient's Medications  New Prescriptions   No medications on file  Previous Medications   ACETAMINOPHEN (TYLENOL) 500 MG TABLET    Take 1,000 mg by mouth 3 (three) times daily.   ASPIRIN EC 81 MG TABLET    Take 81 mg by mouth at bedtime.    ASPIRIN-CAFFEINE (BC FAST PAIN RELIEF PO)    Take 1 packet by mouth daily as needed (pain).   ASPIRIN-SOD BICARB-CITRIC ACID (ALKA-SELTZER) 325 MG TBEF TABLET    Take 650 mg by mouth every 6 (six) hours as needed (indigestion).   ATORVASTATIN (LIPITOR) 40 MG TABLET    Take 40 mg by mouth at bedtime.    B-D UF III MINI PEN NEEDLES 31G X 5 MM MISC    SMARTSIG:1 Each SUB-Q Daily   DONEPEZIL (ARICEPT) 10 MG TABLET    Take 10 mg by mouth at bedtime.    ETHAMBUTOL (MYAMBUTOL) 400 MG TABLET    Take 800 mg by mouth daily.   GABAPENTIN (NEURONTIN) 100 MG CAPSULE    Take 100 mg by mouth at bedtime.   INSULIN LISPRO (HUMALOG) 100 UNIT/ML INJECTION    Inject 5-10  Units into the skin daily as needed for high blood sugar.    ISONIAZID (NYDRAZID) 300 MG TABLET    Take 300 mg by mouth daily.   LANTUS 100 UNIT/ML INJECTION    Inject 60 Units into the skin at bedtime.    LOSARTAN (COZAAR) 50 MG TABLET    Take 50 mg by mouth at bedtime.    PYRAZINAMIDE 500 MG TABLET    Take 1,000 mg by mouth daily.   RIFAMPIN (RIFADIN) 300 MG CAPSULE    Take by mouth.   VITAMIN B-6 (PYRIDOXINE) 25 MG TABLET    Take by mouth at bedtime.  Modified Medications   No medications on file  Discontinued Medications   No medications on file    Subjective: [facility] Yu is in for his routine follow-up visit.  At the time of his initial visit with me in August I had concerns that he might have active tuberculosis causing his cavitary lung lesion.  He has a remote history of treatment for latent TB.  However sputum cultures did not grow MTB.  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  His empiric TB therapy was stopped in late October.  He had a lot of problems with nausea, vomiting and diarrhea while he was on therapy.  The  symptoms resolved promptly after stopping his TB medications.  He thinks he may have regained some of the weight that he lost.  He has not had any fever, chills or sweats.  There has been no change in his baseline cough productive of white phlegm and mild dyspnea on exertion.  Today he is very focused on pain in his left foot.  He shows me a callus under his right heel.  He says it has been there for several years.  He says it is so painful that he cannot stand or walk.  He says that he saw a foot doctor and even told him that there was nothing he could do.  He cannot remember when he saw that doctor.  Review of Systems: Review of Systems  Constitutional: Positive for weight loss. Negative for chills, diaphoresis and fever.  Respiratory: Positive for cough, sputum production and shortness of breath.   Cardiovascular: Negative for chest pain.  Gastrointestinal: Positive for  abdominal pain. Negative for diarrhea, nausea and vomiting.  Musculoskeletal: Positive for joint pain.    Past Medical History:  Diagnosis Date  . Anemia   . Arthritis   . Carotid stenosis   . Chronic back pain   . Chronic kidney disease    stones hx  . Dementia ([place])   . Diabetes mellitus   . GERD (gastroesophageal reflux disease)   . Hypertension   . Lung nodule   . Peripheral neuropathy   . PTSD (post-traumatic stress disorder)   . Schizophrenia ([place])   . Tuberculosis    MED TX X 1 YEAR YEARS AGO     Social History   Tobacco Use  . Smoking status: Current Every Day Smoker    Packs/day: 1.00    Types: Cigarettes  . Smokeless tobacco: Never Used  Vaping Use  . Vaping Use: Never used  Substance Use Topics  . Alcohol use: No    Alcohol/week: 0.0 standard drinks  . Drug use: Yes    Types: Marijuana    Comment: daily    Family History  Problem Relation Age of Onset  . Heart disease Mother        before age 68    Allergies  Allergen Reactions  . Bee Venom Anaphylaxis  . Codeine Other (See Comments)    incoherent   . Diovan [Valsartan] Other (See Comments)    incoherent  . Propoxyphene Other (See Comments)    Dizziness, "Makes me feel drunk"    Objective: Vitals:   12/24/19 0850 12/24/19 0858  BP: 100/76   Pulse: 88   Temp: 97.9 F (36.6 C)   TempSrc: Oral   SpO2: 99%   Weight:  125 lb (56.7 kg)   Body mass index is 16.49 kg/m.  Physical Exam Constitutional:      Comments: He is seated in a wheelchair.  His weight is up 7 pounds over the past month.  He seems angry about his left foot pain.  Cardiovascular:     Rate and Rhythm: Normal rate and regular rhythm.     Heart sounds: No murmur heard.      Comments: He has very distant heart sounds. Pulmonary:     Breath sounds: Normal breath sounds. No wheezing, rhonchi or rales.  Abdominal:     Palpations: Abdomen is soft.     Tenderness: There is no abdominal tenderness.  Skin:    Comments:  He has a very thick, chronic callus under his left heel.  There is no  ulceration, surrounding erythema or other signs of infection.  Psychiatric:        Mood and Affect: Mood normal.         Problem List Items Addressed This Visit      High   Cavitary pneumonia    I will repeat a chest x-ray today to see if his right upper lobe cavity is enlarging.      Relevant Orders   DG Chest 2 View   Unintentional weight loss    His recent weight loss may have been due to adverse side effects of his empiric TB therapy.  It seems to be reversing since his medication was stopped a little over a month ago.      Plantar callus    He says that he is unwilling to go back to see the foot doctor.          Michel Bickers, MD [facility] for Infectious [place] [facility] [phone] pager   [phone] cell 12/24/2019, 9:37 AM

## 2019-12-25 ENCOUNTER — Ambulatory Visit: Payer: Medicare Other

## 2019-12-31 DIAGNOSIS — M25579 Pain in unspecified ankle and joints of unspecified foot: Secondary | ICD-10-CM | POA: Diagnosis not present

## 2019-12-31 DIAGNOSIS — M79672 Pain in left foot: Secondary | ICD-10-CM | POA: Diagnosis not present

## 2019-12-31 DIAGNOSIS — M7732 Calcaneal spur, left foot: Secondary | ICD-10-CM | POA: Diagnosis not present

## 2019-12-31 DIAGNOSIS — M1990 Unspecified osteoarthritis, unspecified site: Secondary | ICD-10-CM | POA: Diagnosis not present

## 2020-01-23 DIAGNOSIS — I509 Heart failure, unspecified: Secondary | ICD-10-CM | POA: Insufficient documentation

## 2020-02-04 DIAGNOSIS — Z23 Encounter for immunization: Secondary | ICD-10-CM | POA: Diagnosis not present

## 2020-03-10 DIAGNOSIS — N183 Chronic kidney disease, stage 3 unspecified: Secondary | ICD-10-CM | POA: Diagnosis not present

## 2020-03-10 DIAGNOSIS — E785 Hyperlipidemia, unspecified: Secondary | ICD-10-CM | POA: Diagnosis not present

## 2020-03-10 DIAGNOSIS — E1129 Type 2 diabetes mellitus with other diabetic kidney complication: Secondary | ICD-10-CM | POA: Diagnosis not present

## 2020-03-10 DIAGNOSIS — R634 Abnormal weight loss: Secondary | ICD-10-CM | POA: Diagnosis not present

## 2020-03-10 DIAGNOSIS — I700 Atherosclerosis of aorta: Secondary | ICD-10-CM | POA: Diagnosis not present

## 2020-03-10 DIAGNOSIS — E1122 Type 2 diabetes mellitus with diabetic chronic kidney disease: Secondary | ICD-10-CM | POA: Diagnosis not present

## 2020-03-10 DIAGNOSIS — M542 Cervicalgia: Secondary | ICD-10-CM | POA: Diagnosis not present

## 2020-03-10 DIAGNOSIS — F209 Schizophrenia, unspecified: Secondary | ICD-10-CM | POA: Diagnosis not present

## 2020-03-10 DIAGNOSIS — Z79899 Other long term (current) drug therapy: Secondary | ICD-10-CM | POA: Diagnosis not present

## 2020-03-11 ENCOUNTER — Other Ambulatory Visit (HOSPITAL_COMMUNITY): Payer: Self-pay | Admitting: Internal Medicine

## 2020-03-11 ENCOUNTER — Ambulatory Visit (HOSPITAL_COMMUNITY)
Admission: RE | Admit: 2020-03-11 | Discharge: 2020-03-11 | Disposition: A | Discharge status: Home / Self Care | Payer: Medicare Other | Source: Ambulatory Visit | Attending: Internal Medicine | Admitting: Internal Medicine

## 2020-03-11 ENCOUNTER — Other Ambulatory Visit: Payer: Self-pay

## 2020-03-11 DIAGNOSIS — M545 Low back pain, unspecified: Secondary | ICD-10-CM | POA: Diagnosis not present

## 2020-03-11 DIAGNOSIS — W19XXXA Unspecified fall, initial encounter: Secondary | ICD-10-CM

## 2020-03-11 DIAGNOSIS — M546 Pain in thoracic spine: Secondary | ICD-10-CM | POA: Diagnosis not present

## 2020-03-11 DIAGNOSIS — M47812 Spondylosis without myelopathy or radiculopathy, cervical region: Secondary | ICD-10-CM | POA: Insufficient documentation

## 2020-03-11 DIAGNOSIS — M542 Cervicalgia: Secondary | ICD-10-CM | POA: Diagnosis not present

## 2020-03-11 DIAGNOSIS — M25572 Pain in left ankle and joints of left foot: Secondary | ICD-10-CM | POA: Diagnosis not present

## 2020-03-11 DIAGNOSIS — Z981 Arthrodesis status: Secondary | ICD-10-CM | POA: Insufficient documentation

## 2020-05-17 ENCOUNTER — Other Ambulatory Visit: Payer: Self-pay

## 2020-05-17 ENCOUNTER — Emergency Department (HOSPITAL_COMMUNITY): Payer: Medicare Other

## 2020-05-17 ENCOUNTER — Inpatient Hospital Stay (HOSPITAL_COMMUNITY)
Admission: EM | Admit: 2020-05-17 | Discharge: 2020-05-21 | DRG: 299 | Disposition: A | Discharge status: Home Health | Payer: Medicare Other | Attending: Internal Medicine | Admitting: Internal Medicine

## 2020-05-17 ENCOUNTER — Encounter (HOSPITAL_COMMUNITY): Payer: Self-pay

## 2020-05-17 DIAGNOSIS — F17210 Nicotine dependence, cigarettes, uncomplicated: Secondary | ICD-10-CM | POA: Diagnosis present

## 2020-05-17 DIAGNOSIS — Z681 Body mass index (BMI) 19 or less, adult: Secondary | ICD-10-CM | POA: Diagnosis not present

## 2020-05-17 DIAGNOSIS — Z20822 Contact with and (suspected) exposure to covid-19: Secondary | ICD-10-CM | POA: Diagnosis present

## 2020-05-17 DIAGNOSIS — Z79899 Other long term (current) drug therapy: Secondary | ICD-10-CM

## 2020-05-17 DIAGNOSIS — I10 Essential (primary) hypertension: Secondary | ICD-10-CM | POA: Diagnosis not present

## 2020-05-17 DIAGNOSIS — G8929 Other chronic pain: Secondary | ICD-10-CM | POA: Diagnosis present

## 2020-05-17 DIAGNOSIS — F0390 Unspecified dementia without behavioral disturbance: Secondary | ICD-10-CM | POA: Diagnosis present

## 2020-05-17 DIAGNOSIS — Z91030 Bee allergy status: Secondary | ICD-10-CM

## 2020-05-17 DIAGNOSIS — I70268 Atherosclerosis of native arteries of extremities with gangrene, other extremity: Secondary | ICD-10-CM | POA: Diagnosis present

## 2020-05-17 DIAGNOSIS — Z01810 Encounter for preprocedural cardiovascular examination: Secondary | ICD-10-CM | POA: Diagnosis not present

## 2020-05-17 DIAGNOSIS — E1159 Type 2 diabetes mellitus with other circulatory complications: Secondary | ICD-10-CM | POA: Diagnosis present

## 2020-05-17 DIAGNOSIS — Z888 Allergy status to other drugs, medicaments and biological substances status: Secondary | ICD-10-CM

## 2020-05-17 DIAGNOSIS — N1831 Chronic kidney disease, stage 3a: Secondary | ICD-10-CM | POA: Diagnosis present

## 2020-05-17 DIAGNOSIS — K219 Gastro-esophageal reflux disease without esophagitis: Secondary | ICD-10-CM | POA: Diagnosis present

## 2020-05-17 DIAGNOSIS — A159 Respiratory tuberculosis unspecified: Secondary | ICD-10-CM | POA: Diagnosis not present

## 2020-05-17 DIAGNOSIS — Z7982 Long term (current) use of aspirin: Secondary | ICD-10-CM

## 2020-05-17 DIAGNOSIS — E119 Type 2 diabetes mellitus without complications: Secondary | ICD-10-CM | POA: Diagnosis present

## 2020-05-17 DIAGNOSIS — E1065 Type 1 diabetes mellitus with hyperglycemia: Secondary | ICD-10-CM

## 2020-05-17 DIAGNOSIS — E46 Unspecified protein-calorie malnutrition: Secondary | ICD-10-CM | POA: Diagnosis present

## 2020-05-17 DIAGNOSIS — I70229 Atherosclerosis of native arteries of extremities with rest pain, unspecified extremity: Secondary | ICD-10-CM | POA: Diagnosis present

## 2020-05-17 DIAGNOSIS — E10649 Type 1 diabetes mellitus with hypoglycemia without coma: Secondary | ICD-10-CM | POA: Diagnosis not present

## 2020-05-17 DIAGNOSIS — M85872 Other specified disorders of bone density and structure, left ankle and foot: Secondary | ICD-10-CM | POA: Diagnosis present

## 2020-05-17 DIAGNOSIS — I779 Disorder of arteries and arterioles, unspecified: Secondary | ICD-10-CM | POA: Diagnosis not present

## 2020-05-17 DIAGNOSIS — L97423 Non-pressure chronic ulcer of left heel and midfoot with necrosis of muscle: Secondary | ICD-10-CM

## 2020-05-17 DIAGNOSIS — Z794 Long term (current) use of insulin: Secondary | ICD-10-CM

## 2020-05-17 DIAGNOSIS — R918 Other nonspecific abnormal finding of lung field: Secondary | ICD-10-CM | POA: Diagnosis not present

## 2020-05-17 DIAGNOSIS — L98498 Non-pressure chronic ulcer of skin of other sites with other specified severity: Secondary | ICD-10-CM | POA: Diagnosis present

## 2020-05-17 DIAGNOSIS — L97429 Non-pressure chronic ulcer of left heel and midfoot with unspecified severity: Secondary | ICD-10-CM | POA: Diagnosis present

## 2020-05-17 DIAGNOSIS — E1042 Type 1 diabetes mellitus with diabetic polyneuropathy: Secondary | ICD-10-CM | POA: Diagnosis present

## 2020-05-17 DIAGNOSIS — S91302A Unspecified open wound, left foot, initial encounter: Secondary | ICD-10-CM | POA: Diagnosis not present

## 2020-05-17 DIAGNOSIS — F17200 Nicotine dependence, unspecified, uncomplicated: Secondary | ICD-10-CM | POA: Diagnosis present

## 2020-05-17 DIAGNOSIS — I129 Hypertensive chronic kidney disease with stage 1 through stage 4 chronic kidney disease, or unspecified chronic kidney disease: Secondary | ICD-10-CM | POA: Diagnosis present

## 2020-05-17 DIAGNOSIS — I739 Peripheral vascular disease, unspecified: Secondary | ICD-10-CM | POA: Diagnosis not present

## 2020-05-17 DIAGNOSIS — L089 Local infection of the skin and subcutaneous tissue, unspecified: Secondary | ICD-10-CM | POA: Diagnosis not present

## 2020-05-17 DIAGNOSIS — R64 Cachexia: Secondary | ICD-10-CM | POA: Diagnosis present

## 2020-05-17 DIAGNOSIS — E876 Hypokalemia: Secondary | ICD-10-CM | POA: Diagnosis not present

## 2020-05-17 DIAGNOSIS — F4310 Post-traumatic stress disorder, unspecified: Secondary | ICD-10-CM | POA: Diagnosis present

## 2020-05-17 DIAGNOSIS — L0390 Cellulitis, unspecified: Secondary | ICD-10-CM | POA: Diagnosis not present

## 2020-05-17 DIAGNOSIS — I70244 Atherosclerosis of native arteries of left leg with ulceration of heel and midfoot: Secondary | ICD-10-CM | POA: Diagnosis not present

## 2020-05-17 DIAGNOSIS — E10621 Type 1 diabetes mellitus with foot ulcer: Secondary | ICD-10-CM | POA: Diagnosis present

## 2020-05-17 DIAGNOSIS — M549 Dorsalgia, unspecified: Secondary | ICD-10-CM | POA: Diagnosis present

## 2020-05-17 DIAGNOSIS — E1022 Type 1 diabetes mellitus with diabetic chronic kidney disease: Secondary | ICD-10-CM | POA: Diagnosis present

## 2020-05-17 DIAGNOSIS — Z981 Arthrodesis status: Secondary | ICD-10-CM | POA: Diagnosis not present

## 2020-05-17 DIAGNOSIS — E43 Unspecified severe protein-calorie malnutrition: Secondary | ICD-10-CM | POA: Diagnosis present

## 2020-05-17 DIAGNOSIS — E1059 Type 1 diabetes mellitus with other circulatory complications: Secondary | ICD-10-CM | POA: Diagnosis not present

## 2020-05-17 DIAGNOSIS — M1990 Unspecified osteoarthritis, unspecified site: Secondary | ICD-10-CM | POA: Diagnosis present

## 2020-05-17 DIAGNOSIS — Z885 Allergy status to narcotic agent status: Secondary | ICD-10-CM

## 2020-05-17 DIAGNOSIS — E1142 Type 2 diabetes mellitus with diabetic polyneuropathy: Secondary | ICD-10-CM | POA: Diagnosis not present

## 2020-05-17 DIAGNOSIS — E11628 Type 2 diabetes mellitus with other skin complications: Secondary | ICD-10-CM | POA: Diagnosis present

## 2020-05-17 DIAGNOSIS — E1052 Type 1 diabetes mellitus with diabetic peripheral angiopathy with gangrene: Principal | ICD-10-CM | POA: Diagnosis present

## 2020-05-17 DIAGNOSIS — Z8249 Family history of ischemic heart disease and other diseases of the circulatory system: Secondary | ICD-10-CM

## 2020-05-17 DIAGNOSIS — E109 Type 1 diabetes mellitus without complications: Secondary | ICD-10-CM | POA: Diagnosis present

## 2020-05-17 DIAGNOSIS — Z8611 Personal history of tuberculosis: Secondary | ICD-10-CM

## 2020-05-17 LAB — CBC WITH DIFFERENTIAL/PLATELET
Abs Immature Granulocytes: 0.02 10*3/uL (ref 0.00–0.07)
Basophils Absolute: 0 10*3/uL (ref 0.0–0.1)
Basophils Relative: 1 %
Eosinophils Absolute: 0.1 10*3/uL (ref 0.0–0.5)
Eosinophils Relative: 1 %
HCT: 27.4 % — ABNORMAL LOW (ref 39.0–52.0)
Hemoglobin: 9.4 g/dL — ABNORMAL LOW (ref 13.0–17.0)
Immature Granulocytes: 0 %
Lymphocytes Relative: 14 %
Lymphs Abs: 0.9 10*3/uL (ref 0.7–4.0)
MCH: 31.5 pg (ref 26.0–34.0)
MCHC: 34.3 g/dL (ref 30.0–36.0)
MCV: 91.9 fL (ref 80.0–100.0)
Monocytes Absolute: 0.5 10*3/uL (ref 0.1–1.0)
Monocytes Relative: 7 %
Neutro Abs: 5.1 10*3/uL (ref 1.7–7.7)
Neutrophils Relative %: 77 %
Platelets: 260 10*3/uL (ref 150–400)
RBC: 2.98 MIL/uL — ABNORMAL LOW (ref 4.22–5.81)
RDW: 13 % (ref 11.5–15.5)
WBC: 6.7 10*3/uL (ref 4.0–10.5)
nRBC: 0 % (ref 0.0–0.2)

## 2020-05-17 LAB — BASIC METABOLIC PANEL
Anion gap: 9 (ref 5–15)
BUN: 15 mg/dL (ref 8–23)
CO2: 26 mmol/L (ref 22–32)
Calcium: 9.1 mg/dL (ref 8.9–10.3)
Chloride: 99 mmol/L (ref 98–111)
Creatinine, Ser: 1.26 mg/dL — ABNORMAL HIGH (ref 0.61–1.24)
GFR, Estimated: 60 mL/min — ABNORMAL LOW (ref >60)
Glucose, Bld: 330 mg/dL — ABNORMAL HIGH (ref 70–99)
Potassium: 3.5 mmol/L (ref 3.5–5.1)
Sodium: 134 mmol/L — ABNORMAL LOW (ref 135–145)

## 2020-05-17 LAB — RESP PANEL BY RT-PCR (FLU A&B, COVID) ARPGX2
Influenza A by PCR: NEGATIVE
Influenza B by PCR: NEGATIVE
SARS Coronavirus 2 by RT PCR: NEGATIVE

## 2020-05-17 LAB — CBG MONITORING, ED
Glucose-Capillary: 304 mg/dL — ABNORMAL HIGH (ref 70–99)
Glucose-Capillary: 61 mg/dL — ABNORMAL LOW (ref 70–99)
Glucose-Capillary: 103 mg/dL — ABNORMAL HIGH (ref 70–99)

## 2020-05-17 LAB — LACTIC ACID, PLASMA: Lactic Acid, Venous: 0.8 mmol/L (ref 0.5–1.9)

## 2020-05-17 MED ORDER — DEXTROSE 50 % IV SOLN
1.0000 | Freq: Once | INTRAVENOUS | Status: AC
  Administered 2020-05-17: 50 mL via INTRAVENOUS
  Filled 2020-05-17: qty 50

## 2020-05-17 MED ORDER — VANCOMYCIN HCL 1250 MG/250ML IV SOLN
1250.0000 mg | Freq: Once | INTRAVENOUS | Status: AC
  Administered 2020-05-17: 1250 mg via INTRAVENOUS
  Filled 2020-05-17: qty 250

## 2020-05-17 MED ORDER — INSULIN ASPART 100 UNIT/ML ~~LOC~~ SOLN
8.0000 [IU] | Freq: Once | SUBCUTANEOUS | Status: AC
  Administered 2020-05-17: 8 [IU] via SUBCUTANEOUS
  Filled 2020-05-17: qty 1

## 2020-05-17 MED ORDER — PIPERACILLIN-TAZOBACTAM 3.375 G IVPB 30 MIN
3.3750 g | Freq: Once | INTRAVENOUS | Status: AC
  Administered 2020-05-17: 3.375 g via INTRAVENOUS
  Filled 2020-05-17: qty 50

## 2020-05-17 MED ORDER — ONDANSETRON HCL 4 MG/2ML IJ SOLN
4.0000 mg | Freq: Once | INTRAMUSCULAR | Status: AC
  Administered 2020-05-17: 4 mg via INTRAVENOUS
  Filled 2020-05-17: qty 2

## 2020-05-17 MED ORDER — VANCOMYCIN HCL 750 MG/150ML IV SOLN
750.0000 mg | INTRAVENOUS | Status: DC
  Filled 2020-05-17 (×3): qty 150

## 2020-05-17 MED ORDER — MORPHINE SULFATE (PF) 4 MG/ML IV SOLN
4.0000 mg | Freq: Once | INTRAVENOUS | Status: AC
Start: 2020-05-17 — End: 2020-05-17
  Administered 2020-05-17: 4 mg via INTRAVENOUS
  Filled 2020-05-17: qty 1

## 2020-05-17 MED ORDER — IOHEXOL 350 MG/ML SOLN
100.0000 mL | Freq: Once | INTRAVENOUS | Status: AC | PRN
  Administered 2020-05-17: 100 mL via INTRAVENOUS

## 2020-05-17 NOTE — ED Provider Notes (Signed)
[facility] EMERGENCY DEPARTMENT Provider Note   CSN: [id number] Arrival date & time: 05/17/20  0751     History Chief Complaint  Patient presents with  . Wound Check    Allen Yu is a 75 y.o. male.  Pt presents to the ED today to check a wound on his left foot.  Pt has a hx of DM.  He said his left heel has been hurting for a few months.  He went to a podiatrist in December, but it still hurts.  Pt has noticed some drainage coming from the heel.        Past Medical History:  Diagnosis Date  . Anemia   . Arthritis   . Carotid stenosis   . Chronic back pain   . Chronic kidney disease    stones hx  . Dementia ([place])   . Diabetes mellitus   . GERD (gastroesophageal reflux disease)   . Hypertension   . Lung nodule   . Peripheral neuropathy   . PTSD (post-traumatic stress disorder)   . Schizophrenia ([place])   . Tuberculosis    MED TX X 1 YEAR YEARS AGO     Patient Active Problem List   Diagnosis Date Noted  . Plantar callus 12/24/2019  . Cavitary pneumonia 09/14/2019  . 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 Unintentional weight loss 09/14/2019  . Coronary artery calcification seen on CT scan 10/28/2018  . Hilar adenopathy 04/10/2016  . Lung nodule seen on imaging study 07/09/2015  . Acute encephalopathy   . Hypoglycemia due to insulin   . Hypertension 07/08/2015  . Insulin dependent diabetes mellitus 07/08/2015  . AKI (acute kidney injury) ([place]) 07/08/2015  . Normocytic anemia 07/08/2015  . Tobacco abuse 07/08/2015  . Diverticulosis of colon without hemorrhage   . Left inguinal hernia 09/16/2013  . Altered mental status 11/17/2012  . Hypothermia 11/17/2012  . Leukocytosis 11/17/2012  . CKD (chronic kidney disease), stage II   . PTSD (post-traumatic stress disorder)   . GERD (gastroesophageal reflux disease)     Past Surgical History:  Procedure Laterality Date  . APPENDECTOMY    . BACK SURGERY  2000, 2013   x2  . BLADDER SURGERY     02   . CHOLECYSTECTOMY    . COLONOSCOPY    . COLONOSCOPY N/A 12/07/2014   Procedure: COLONOSCOPY;  Surgeon: Daneil Dolin, MD;  [facility];  Service: Endoscopy;  Laterality: N/A;  10:30 Am  . EYE SURGERY Bilateral    removed metal from eye  . HERNIA REPAIR Right   . INGUINAL HERNIA REPAIR Left 11/03/2013   Procedure: HERNIA REPAIR INGUINAL ADULT;  Surgeon: Gayland Curry, MD;  Location: [place];  Service: General;  Laterality: Left;  . SHOULDER SURGERY     RIGHT SHOULDER   . VIDEO BRONCHOSCOPY WITH ENDOBRONCHIAL NAVIGATION N/A 07/10/2019   Procedure: VIDEO BRONCHOSCOPY WITH ENDOBRONCHIAL NAVIGATION;  Surgeon: Melrose Nakayama, MD;  Location: [facility] OR;  Service: Thoracic;  Laterality: N/A;       Family History  Problem Relation Age of Onset  . Heart disease Mother        before age 21    Social History   Tobacco Use  . Smoking status: Current Every Day Smoker    Packs/day: 1.00    Types: Cigarettes  . Smokeless tobacco: Never Used  Vaping Use  . Vaping Use: Never used  Substance Use Topics  . Alcohol use: No    Alcohol/week:  0.0 standard drinks  . Drug use: Yes    Types: Marijuana    Comment: daily    Home Medications Prior to Admission medications   Medication Sig Start Date End Date Taking? Authorizing Provider  acetaminophen (TYLENOL) 500 MG tablet Take 1,000 mg by mouth 3 (three) times daily.    [provider]  aspirin EC 81 MG tablet Take 81 mg by mouth at bedtime.     [provider]  Aspirin-Caffeine (BC FAST PAIN RELIEF PO) Take 1 packet by mouth daily as needed (pain).    [provider]  aspirin-sod bicarb-citric acid (ALKA-SELTZER) 325 MG TBEF tablet Take 650 mg by mouth every 6 (six) hours as needed (indigestion).    [provider]  atorvastatin (LIPITOR) 40 MG tablet Take 40 mg by mouth at bedtime.  06/10/15   [provider]  B-D UF III MINI PEN NEEDLES 31G X 5 MM MISC SMARTSIG:1 Each SUB-Q Daily  12/23/19   [provider]  donepezil (ARICEPT) 10 MG tablet Take 10 mg by mouth at bedtime.  06/28/16   [provider]  ethambutol (MYAMBUTOL) 400 MG tablet Take 800 mg by mouth daily. 10/01/19   [provider]  gabapentin (NEURONTIN) 100 MG capsule Take 100 mg by mouth at bedtime. 11/06/19   [provider]  insulin lispro (HUMALOG) 100 UNIT/ML injection Inject 5-10 Units into the skin daily as needed for high blood sugar.     [provider]  isoniazid (NYDRAZID) 300 MG tablet Take 300 mg by mouth daily. 10/01/19   [provider]  LANTUS 100 UNIT/ML injection Inject 60 Units into the skin at bedtime.  06/09/16   [provider]  losartan (COZAAR) 50 MG tablet Take 50 mg by mouth at bedtime.  Patient not taking: Reported on 12/24/2019    [provider]  pyrazinamide 500 MG tablet Take 1,000 mg by mouth daily. 10/01/19   [provider]  rifampin (RIFADIN) 300 MG capsule Take by mouth. 10/01/19   [provider]  vitamin B-6 (PYRIDOXINE) 25 MG tablet Take by mouth at bedtime. 11/09/19   [provider]    Allergies    Bee venom, Codeine, Diovan [valsartan], and Propoxyphene  Review of Systems   Review of Systems  Musculoskeletal:       Left heel pain  All other systems reviewed and are negative.   Physical Exam Updated Vital Signs BP 111/74   Pulse 79   Temp (!) 97.5 F (36.4 C) (Oral)   Resp 16   Ht 6\' 1"  (1.854 m)   Wt 58.5 kg   SpO2 100%   BMI 17.02 kg/m   Physical Exam Vitals and nursing note reviewed.  Constitutional:      Appearance: Normal appearance.  HENT:     Head: Normocephalic and atraumatic.     Right Ear: External ear normal.     Left Ear: External ear normal.     Nose: Nose normal.     Mouth/Throat:     Mouth: Mucous membranes are dry.  Eyes:     Extraocular Movements: Extraocular movements intact.     Conjunctiva/sclera: Conjunctivae normal.     Pupils: Pupils  are equal, round, and reactive to light.  Cardiovascular:     Rate and Rhythm: Normal rate and regular rhythm.     Pulses: Normal pulses.     Heart sounds: Normal heart sounds.  Pulmonary:     Effort: Pulmonary effort is normal.  Breath sounds: Normal breath sounds.  Abdominal:     General: Abdomen is flat. Bowel sounds are normal.     Palpations: Abdomen is soft.  Musculoskeletal:     Cervical back: Normal range of motion and neck supple.       Feet:  Lymphadenopathy:     Comments: Left heel with necrosis and foul odor  Skin:    Capillary Refill: Capillary refill takes less than 2 seconds.  Neurological:     General: No focal deficit present.     Mental Status: He is alert and oriented to person, place, and time.       ED Results / Procedures / Treatments   Labs (all labs ordered are listed, but only abnormal results are displayed) Labs Reviewed  BASIC METABOLIC PANEL - Abnormal; Notable for the following components:      Result Value   Sodium 134 (*)    Glucose, Bld 330 (*)    Creatinine, Ser 1.26 (*)    GFR, Estimated 60 (*)    All other components within normal limits  CBC WITH DIFFERENTIAL/PLATELET - Abnormal; Notable for the following components:   RBC 2.98 (*)    Hemoglobin 9.4 (*)    HCT 27.4 (*)    All other components within normal limits  CBG MONITORING, ED - Abnormal; Notable for the following components:   Glucose-Capillary 304 (*)    All other components within normal limits  CULTURE, BLOOD (ROUTINE X 2)  CULTURE, BLOOD (ROUTINE X 2)  RESP PANEL BY RT-PCR (FLU A&B, COVID) ARPGX2  LACTIC ACID, PLASMA    EKG None  Radiology DG Foot Complete Left  Result Date: 05/17/2020 CLINICAL DATA:  Pain.  Wound over heel EXAM: LEFT FOOT - COMPLETE 3+ VIEW COMPARISON:  Left ankle 03/11/2020. FINDINGS: Diffuse severe osteopenia and degenerative change. Question erosions of the medial aspect of the distal left second and third metatarsals. These findings may  just be related to severe osteopenia. Clinical correlation suggested. No evidence of fracture or dislocation. Soft tissue wound noted over the heel. No adjacent bony erosion. No radiopaque foreign body. Peripheral vascular calcification. IMPRESSION: 1. Diffuse severe osteopenia and degenerative change. Questionable erosions of the medial aspect of the distal left second and third metatarsals. These findings may just be related to the severe osteopenia. Clinical correlation is suggested. No evidence of fracture or dislocation. 2. Soft tissue wound over the heel. No adjacent bony erosion. No radiopaque foreign body. Peripheral vascular calcification. Electronically Signed   By: Marcello Moores  Register   On: 05/17/2020 09:23    Procedures Procedures   Medications Ordered in ED Medications  vancomycin (VANCOREADY) IVPB 750 mg/150 mL (has no administration in time range)  piperacillin-tazobactam (ZOSYN) IVPB 3.375 g (0 g Intravenous Stopped 05/17/20 1018)  morphine 4 MG/ML injection 4 mg (4 mg Intravenous Given 05/17/20 0848)  ondansetron (ZOFRAN) injection 4 mg (4 mg Intravenous Given 05/17/20 0848)  vancomycin (VANCOREADY) IVPB 1250 mg/250 mL (0 mg Intravenous Stopped 05/17/20 1214)  iohexol (OMNIPAQUE) 350 MG/ML injection 100 mL (100 mLs Intravenous Contrast Given 05/17/20 1001)  insulin aspart (novoLOG) injection 8 Units (8 Units Subcutaneous Given 05/17/20 1229)    ED Course  I have reviewed the triage vital signs and the nursing notes.  Pertinent labs & imaging results that were available during my care of the patient were reviewed by me and considered in my medical decision making (see chart for details).    MDM Rules/Calculators/A&P  Pt has necrosis and foul odor of the wound.  The pt's xray does not show definite evidence of osteomyelitis.  Pulses are not able to be palpated, but I am able to get a dopplerable pulse to both feet, but the left foot dp is weak.  Pt's bs  elevated to 330.  He is given 8 units insulin SQ.  Pt d/w vascular to review CT scan to see where pt can be admitted. Dr. Scot Dock is in the middle of an operation and will look at the films as soon as he is able. I suspect he will need admission to [facility] due to the significant arterial disease.  Pt signed out to Dr. Roderic Palau at shift change.  CRITICAL CARE Performed by: Isla Pence   Total critical care time: 30 minutes  Critical care time was exclusive of separately billable procedures and treating other patients.  Critical care was necessary to treat or prevent imminent or life-threatening deterioration.  Critical care was time spent personally by me on the following activities: development of treatment plan with patient and/or surrogate as well as nursing, discussions with consultants, evaluation of patient's response to treatment, examination of patient, obtaining history from patient or surrogate, ordering and performing treatments and interventions, ordering and review of laboratory studies, ordering and review of radiographic studies, pulse oximetry and re-evaluation of patient's condition.  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

## 2020-05-17 NOTE — ED Triage Notes (Signed)
Wound on heel of foot, appears to have eschar and foul odor.

## 2020-05-17 NOTE — ED Notes (Signed)
Pt reports he was being treated for [facility] in February but stopped taking his meds because he didn't think he needed them anymore because he had been treated 10 years prior. Dr. Josph Macho notified.

## 2020-05-17 NOTE — Progress Notes (Signed)
Pharmacy Antibiotic Note  Allen Yu is a 75 y.o. male admitted on 05/17/2020 with cellulitis.  Pharmacy has been consulted for Vancomycin dosing.  Plan: Vancomycin 1250 mg IV x 1 dose. Vancomycin 750 mg IV every 24 hours. Monitor labs, c/s, and vanco level as indicated.  Height: 6\' 1"  (185.4 cm) Weight: 58.5 kg (129 lb) IBW/kg (Calculated) : 79.9  Temp (24hrs), Avg:97.5 F (36.4 C), Min:97.5 F (36.4 C), Max:97.5 F (36.4 C)  Recent Labs  Lab 05/17/20 0842  WBC 6.7  CREATININE 1.26*  LATICACIDVEN 0.8    Estimated Creatinine Clearance: 42.6 mL/min (A) (by C-G formula based on SCr of 1.26 mg/dL (H)).    Allergies  Allergen Reactions  . Bee Venom Anaphylaxis  . Codeine Other (See Comments)    incoherent   . Diovan [Valsartan] Other (See Comments)    incoherent  . Propoxyphene Other (See Comments)    Dizziness, "Makes me feel drunk"    Antimicrobials this admission: Vanco 4/26 >>  Zosyn 4/26   Microbiology results: 4/26 BCx: pending   Thank you for allowing pharmacy to be a part of this patient's care.  Ramond Craver 05/17/2020 11:49 AM

## 2020-05-17 NOTE — ED Notes (Signed)
Patient to CT at this time

## 2020-05-17 NOTE — ED Notes (Signed)
Assumed care of pt at this time. Pt sitting on end of bed, calm and cooperative at this time.

## 2020-05-17 NOTE — ED Triage Notes (Signed)
Patient reports wound to left foot for the past two months. States that he has been seen by a foot MD and told there was nothing wrong with it but it continues to hurt. HX of DM.

## 2020-05-17 NOTE — ED Notes (Signed)
Repaged Vascular to Dr Gilford Raid

## 2020-05-17 NOTE — H&P (Addendum)
TRH H&P    Patient Demographics:    Allen Yu, is a 75 y.o. male  MRN: [id number]  DOB - 1945/04/13  Admit Date - 05/17/2020  Referring MD/NP/PA: Roderic Palau  Outpatient Primary MD for the patient is [name], MD  Patient coming from: Home  Chief complaint- foot pain   HPI:    Allen Yu  is a 75 y.o. male, with history of tuberculosis, PTSD, hypertension, GERD, diabetes mellitus type 1, dementia, CKD, and more presents the ED with a chief complaint of foot pain.  Patient is a very difficult historian who gets so agitated and yells during medical interview that security came rushing in to see maybe if he was okay.  Patient reports that he has had 4 months of foot pain and ulcer on the heel of the left foot.  He reports that he has been to his PCP and the ED podiatrist who told him there is nothing to do for it.  He then also says that he has taken antibiotics for it.  He reports melena also for started he had fell backwards on the steps in hit his foot against something that broke the skin.  He reports that it is never healed since that time.  He has tried creams on it, heel balsam,, and dressings.  He occasionally has bloody discharge.  More recently has started having malodorous discharge.  He reports purulent discharge, but then when he showed He is talking about it serosanguineous, so it is difficult to know if he is seeing purulent discharge at home.  He denies any fevers.  He has had nausea and vomiting and reports his last meal was 2 or 3 days ago.  Since he has not been eating he is not taking his insulin.  He acknowledges that his sugar is high but reports it is normally well controlled when he is eating and taking his insulin.  Patient's no other complaints at this time -but he is distracted by irritability/agitation/fighting with his wife at bedside and has not been escorted out by security.  Patient does  smoke but declines a nicotine patch.  He does not drink alcohol but reports that he does smoke marijuana.  Denies any other illicit drugs.  Patient is full code.  In the ED Temperature 97.5, heart rate 69-82, respiratory rate 15-18, blood pressure 149/137, satting 100% CT angio aortobifemoral with and without contrast -aortoiliac extensive atheromatous plaque out high-grade occlusion or stenosis.  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  Right SFA stenosis with diseased peroneal runoff.  Left SFA proximal stenosis with segmental distal occlusion with reconstituted diseased peroneal runoff. Vascular surgery consulted and recommends hospitalist admission at [facility] Blood cultures pending Negative COVID White blood cell count 6.7, hemoglobin 9.4 Chemistry panel is mostly unremarkable with a slight elevation in creatinine to 1.26, and a hyperglycemia of 330 Patient was started on Vanco and Zosyn in the ED    Review of systems:    In addition to the HPI above,  No Fever-chills, No Headache, No changes with Vision  or hearing, No problems swallowing food or Liquids, No Chest pain, Cough or Shortness of Breath, No Abdominal pain, admits to nausea and vomiting, bowel movements are regular, No Blood in stool or Urine, No dysuria, No new joints pains-aches,  No new weakness, tingling, numbness in any extremity, No recent weight gain or loss, No polyuria, polydypsia or polyphagia, No significant Mental Stressors.  All other systems reviewed and are negative.    Past History of the following :    Past Medical History:  Diagnosis Date  . Anemia   . Arthritis   . Carotid stenosis   . Chronic back pain   . Chronic kidney disease    stones hx  . Dementia ([place])   . Diabetes mellitus   . GERD (gastroesophageal reflux disease)   . Hypertension   . Lung nodule   . Peripheral neuropathy   . PTSD (post-traumatic stress disorder)   . Schizophrenia ([place])   . Tuberculosis     MED TX X 1 YEAR YEARS AGO       Past Surgical History:  Procedure Laterality Date  . APPENDECTOMY    . BACK SURGERY  2000, 2013   x2  . BLADDER SURGERY     02  . CHOLECYSTECTOMY    . COLONOSCOPY    . COLONOSCOPY N/A 12/07/2014   Procedure: COLONOSCOPY;  Surgeon: Daneil Dolin, MD;  [facility];  Service: Endoscopy;  Laterality: N/A;  10:30 Am  . EYE SURGERY Bilateral    removed metal from eye  . HERNIA REPAIR Right   . INGUINAL HERNIA REPAIR Left 11/03/2013   Procedure: HERNIA REPAIR INGUINAL ADULT;  Surgeon: Gayland Curry, MD;  Location: [facility];  Service: General;  Laterality: Left;  . SHOULDER SURGERY     RIGHT SHOULDER   . VIDEO BRONCHOSCOPY WITH ENDOBRONCHIAL NAVIGATION N/A 07/10/2019   Procedure: VIDEO BRONCHOSCOPY WITH ENDOBRONCHIAL NAVIGATION;  Surgeon: Melrose Nakayama, MD;  Location: [facility] OR;  Service: Thoracic;  Laterality: N/A;      Social History:      Social History   Tobacco Use  . Smoking status: Current Every Day Smoker    Packs/day: 1.00    Types: Cigarettes  . Smokeless tobacco: Never Used  Substance Use Topics  . Alcohol use: No    Alcohol/week: 0.0 standard drinks       Family History :     Family History  Problem Relation Age of Onset  . Heart disease Mother        before age 29      Home Medications:   Prior to Admission medications   Medication Sig Start Date End Date Taking? Authorizing Provider  aspirin EC 81 MG tablet Take 81 mg by mouth at bedtime.   Yes [provider]  Aspirin-Caffeine (BC FAST PAIN RELIEF PO) Take 1 packet by mouth daily as needed (pain).   Yes [provider]  atorvastatin (LIPITOR) 40 MG tablet Take 40 mg by mouth at bedtime.  06/10/15  Yes [provider]  donepezil (ARICEPT) 10 MG tablet Take 10 mg by mouth at bedtime.  06/28/16  Yes [provider]  ethambutol (MYAMBUTOL) 400 MG tablet Take 800 mg by mouth daily. 10/01/19  Yes [provider]   gabapentin (NEURONTIN) 100 MG capsule Take 100 mg by mouth at bedtime. 11/06/19  Yes [provider]  insulin lispro (HUMALOG) 100 UNIT/ML injection Inject 5-10 Units into the skin daily as needed for high blood  sugar.    Yes [provider]  LANTUS 100 UNIT/ML injection Inject 60 Units into the skin at bedtime.  06/09/16  Yes [provider]  acetaminophen (TYLENOL) 500 MG tablet Take 1,000 mg by mouth 3 (three) times daily. Patient not taking: Reported on 05/17/2020    [provider]  aspirin-sod bicarb-citric acid (ALKA-SELTZER) 325 MG TBEF tablet Take 650 mg by mouth every 6 (six) hours as needed (indigestion). Patient not taking: No sig reported    [provider]  B-D UF III MINI PEN NEEDLES 31G X 5 MM MISC SMARTSIG:1 Each SUB-Q Daily 12/23/19   [provider]  isoniazid (NYDRAZID) 300 MG tablet Take 300 mg by mouth daily. Patient not taking: No sig reported 10/01/19   [provider]  losartan (COZAAR) 50 MG tablet Take 50 mg by mouth at bedtime.  Patient not taking: No sig reported    [provider]  pyrazinamide 500 MG tablet Take 1,000 mg by mouth daily. Patient not taking: No sig reported 10/01/19   [provider]  rifampin (RIFADIN) 300 MG capsule Take by mouth. Patient not taking: No sig reported 10/01/19   [provider]  vitamin B-6 (PYRIDOXINE) 25 MG tablet Take by mouth at bedtime. Patient not taking: No sig reported 11/09/19   [provider]     Allergies:     Allergies  Allergen Reactions  . Bee Venom Anaphylaxis  . Codeine Other (See Comments)    incoherent   . Diovan [Valsartan] Other (See Comments)    incoherent  . Propoxyphene Other (See Comments)    Dizziness, "Makes me feel drunk"     Physical Exam:   Vitals  Blood pressure (!) 149/137, pulse 82, temperature (!) 97.5 F (36.4 C), temperature source Oral, resp. rate 17, height 6\' 1"  (1.854 m), weight 58.5  kg, SpO2 100 %.  1.  General: Patient sitting up on the end of bed, no acute distress  2. Psychiatric: Irritable/agitated, pointing his finger in my face, yelling, partially cooperative with exam  3. Neurologic: Face is symmetric, speech and language are normal, moves all 4 extremities voluntarily, alert and oriented x3, no acute deficits on limited exam  4. HEENMT:  Head is atraumatic, normocephalic, pupils are reactive to light, neck is supple, trachea is midline, mucous membranes are moist  5. Respiratory : Lungs are clear to auscultation bilaterally without wheezing, rhonchi, rales, clubbing present, no cyanosis  6. Cardiovascular : Heart rate is normal, rhythm is regular, no murmurs rubs or gallops, peripheral pulses difficult to palpat  7. Gastrointestinal:  Abdomen is soft, flat, nondistended, nontender to palpation, bowel sounds active, no palpable masses  8. Skin:  Necrotic ulcer on the bottom of the left heel, serosanguineous/malodorous drainage, no purulent drainage, no surrounding erythema  9.Musculoskeletal:  No calf tenderness, no acute deformity, peripheral pulses difficult to palpate especially on the left    Data Review:    CBC Recent Labs  Lab 05/17/20 0842  WBC 6.7  HGB 9.4*  HCT 27.4*  PLT 260  MCV 91.9  MCH 31.5  MCHC 34.3  RDW 13.0  LYMPHSABS 0.9  MONOABS 0.5  EOSABS 0.1  BASOSABS 0.0   ------------------------------------------------------------------------------------------------------------------  Results for orders placed or performed during the hospital encounter of 05/17/20 (from the past 48 hour(s))  Basic metabolic panel     Status: Abnormal   Collection Time: 05/17/20  8:42 AM  Result Value Ref Range   Sodium 134 (L) 135 - 145  mmol/L   Potassium 3.5 3.5 - 5.1 mmol/L   Chloride 99 98 - 111 mmol/L   CO2 26 22 - 32 mmol/L   Glucose, Bld 330 (H) 70 - 99 mg/dL    Comment: Glucose reference range applies only to samples taken after  fasting for at least 8 hours.   BUN 15 8 - 23 mg/dL   Creatinine, Ser 1.26 (H) 0.61 - 1.24 mg/dL   Calcium 9.1 8.9 - 10.3 mg/dL   GFR, Estimated 60 (L) >60 mL/min    Comment: (NOTE) Calculated using the CKD-EPI Creatinine Equation (2021)    Anion gap 9 5 - 15    Comment: Performed at [facility], [address] [place], [place] [address]  CBC with Differential     Status: Abnormal   Collection Time: 05/17/20  8:42 AM  Result Value Ref Range   WBC 6.7 4.0 - 10.5 K/uL   RBC 2.98 (L) 4.22 - 5.81 MIL/uL   Hemoglobin 9.4 (L) 13.0 - 17.0 g/dL   HCT 27.4 (L) 39.0 - 52.0 %   MCV 91.9 80.0 - 100.0 fL   MCH 31.5 26.0 - 34.0 pg   MCHC 34.3 30.0 - 36.0 g/dL   RDW 13.0 11.5 - 15.5 %   Platelets 260 150 - 400 K/uL   nRBC 0.0 0.0 - 0.2 %   Neutrophils Relative % 77 %   Neutro Abs 5.1 1.7 - 7.7 K/uL   Lymphocytes Relative 14 %   Lymphs Abs 0.9 0.7 - 4.0 K/uL   Monocytes Relative 7 %   Monocytes Absolute 0.5 0.1 - 1.0 K/uL   Eosinophils Relative 1 %   Eosinophils Absolute 0.1 0.0 - 0.5 K/uL   Basophils Relative 1 %   Basophils Absolute 0.0 0.0 - 0.1 K/uL   Immature Granulocytes 0 %   Abs Immature Granulocytes 0.02 0.00 - 0.07 K/uL    Comment: Performed at [facility], [address], [place], [place] [address]  Lactic acid, plasma     Status: None   Collection Time: 05/17/20  8:42 AM  Result Value Ref Range   Lactic Acid, Venous 0.8 0.5 - 1.9 mmol/L    Comment: Performed at [facility], [address], [place], [place] [address]  Resp Panel by RT-PCR (Flu A&B, Covid) Nasopharyngeal Swab     Status: None   Collection Time: 05/17/20  9:10 AM   Specimen: Nasopharyngeal Swab; Nasopharyngeal(NP) swabs in vial transport medium  Result Value Ref Range   SARS Coronavirus 2 by RT PCR NEGATIVE NEGATIVE    Comment: (NOTE) SARS-CoV-2 target nucleic acids are NOT DETECTED.  The SARS-CoV-2 RNA is generally detectable in upper respiratory specimens during the acute phase of infection. The  lowest concentration of SARS-CoV-2 viral copies this assay can detect is 138 copies/mL. A negative result does not preclude SARS-Cov-2 infection and should not be used as the sole basis for treatment or other patient management decisions. A negative result may occur with  improper specimen collection/handling, submission of specimen other than nasopharyngeal swab, presence of viral mutation(s) within the areas targeted by this assay, and inadequate number of viral copies(<138 copies/mL). A negative result must be combined with clinical observations, patient history, and epidemiological information. The expected result is Negative.  Fact Sheet for Patients:  EntrepreneurPulse.com.au  Fact Sheet for Healthcare Providers:  IncredibleEmployment.be  This test is no t yet approved or cleared by the [place] FDA and  has been authorized for detection and/or diagnosis of SARS-CoV-2 by  FDA under an Emergency Use Authorization (EUA). This EUA will remain  in effect (meaning this test can be used) for the duration of the COVID-19 declaration under Section 564(b)(1) of the Act, 21 U.S.C.section 360bbb-3(b)(1), unless the authorization is terminated  or revoked sooner.       Influenza A by PCR NEGATIVE NEGATIVE   Influenza B by PCR NEGATIVE NEGATIVE    Comment: (NOTE) The Xpert Xpress SARS-CoV-2/FLU/RSV plus assay is intended as an aid in the diagnosis of influenza from Nasopharyngeal swab specimens and should not be used as a sole basis for treatment. Nasal washings and aspirates are unacceptable for Xpert Xpress SARS-CoV-2/FLU/RSV testing.  Fact Sheet for Patients: EntrepreneurPulse.com.au  Fact Sheet for Healthcare Providers: IncredibleEmployment.be  This test is not yet approved or cleared by the [place] FDA and has been authorized for detection and/or diagnosis of SARS-CoV-2 by FDA under an Emergency  Use Authorization (EUA). This EUA will remain in effect (meaning this test can be used) for the duration of the COVID-19 declaration under Section 564(b)(1) of the Act, 21 U.S.C. section 360bbb-3(b)(1), unless the authorization is terminated or revoked.  Performed at [facility], [address], [place], [place] [address]   Culture, blood (routine x 2)     Status: None (Preliminary result)   Collection Time: 05/17/20  9:37 AM   Specimen: BLOOD LEFT HAND  Result Value Ref Range   Specimen Description BLOOD LEFT HAND    Special Requests      BOTTLES DRAWN AEROBIC AND ANAEROBIC Blood Culture results may not be optimal due to an inadequate volume of blood received in culture bottles Performed at [facility], [address], [place], [place] [address]    Culture PENDING    Report Status PENDING   Culture, blood (routine x 2)     Status: None (Preliminary result)   Collection Time: 05/17/20  9:38 AM   Specimen: BLOOD RIGHT HAND  Result Value Ref Range   Specimen Description BLOOD RIGHT HAND    Special Requests      BOTTLES DRAWN AEROBIC AND ANAEROBIC Blood Culture adequate volume Performed at [facility], [address], [place], [place] [address]    Culture PENDING    Report Status PENDING   POC CBG, ED     Status: Abnormal   Collection Time: 05/17/20 12:04 PM  Result Value Ref Range   Glucose-Capillary 304 (H) 70 - 99 mg/dL    Comment: Glucose reference range applies only to samples taken after fasting for at least 8 hours.  CBG monitoring, ED     Status: Abnormal   Collection Time: 05/17/20  3:52 PM  Result Value Ref Range   Glucose-Capillary 61 (L) 70 - 99 mg/dL    Comment: Glucose reference range applies only to samples taken after fasting for at least 8 hours.  CBG monitoring, ED     Status: Abnormal   Collection Time: 05/17/20  4:48 PM  Result Value Ref Range   Glucose-Capillary 103 (H) 70 - 99 mg/dL    Comment: Glucose reference range applies only to samples taken after  fasting for at least 8 hours.    Chemistries  Recent Labs  Lab 05/17/20 0842  NA 134*  K 3.5  CL 99  CO2 26  GLUCOSE 330*  BUN 15  CREATININE 1.26*  CALCIUM 9.1   ------------------------------------------------------------------------------------------------------------------  ------------------------------------------------------------------------------------------------------------------ GFR: Estimated Creatinine Clearance: 42.6 mL/min (A) (by C-G formula based on SCr of 1.26 mg/dL (H)). Liver Function Tests: No results  for input(s): AST, ALT, ALKPHOS, BILITOT, PROT, ALBUMIN in the last 168 hours. No results for input(s): LIPASE, AMYLASE in the last 168 hours. No results for input(s): AMMONIA in the last 168 hours. Coagulation Profile: No results for input(s): INR, PROTIME in the last 168 hours. Cardiac Enzymes: No results for input(s): CKTOTAL, CKMB, CKMBINDEX, TROPONINI in the last 168 hours. BNP (last 3 results) No results for input(s): PROBNP in the last 8760 hours. HbA1C: No results for input(s): HGBA1C in the last 72 hours. CBG: Recent Labs  Lab 05/17/20 1204 05/17/20 1552 05/17/20 1648  GLUCAP 304* 61* 103*   Lipid Profile: No results for input(s): CHOL, HDL, LDLCALC, TRIG, CHOLHDL, LDLDIRECT in the last 72 hours. Thyroid Function Tests: No results for input(s): TSH, T4TOTAL, FREET4, T3FREE, THYROIDAB in the last 72 hours. Anemia Panel: No results for input(s): VITAMINB12, FOLATE, FERRITIN, TIBC, IRON, RETICCTPCT in the last 72 hours.  --------------------------------------------------------------------------------------------------------------- Urine analysis:    Component Value Date/Time   COLORURINE YELLOW 07/09/2016 [place] 07/09/2016 0733   LABSPEC 1.013 07/09/2016 0733   PHURINE 5.0 07/09/2016 0733   GLUCOSEU NEGATIVE 07/09/2016 0733   HGBUR SMALL (A) 07/09/2016 0733   BILIRUBINUR NEGATIVE 07/09/2016 0733   KETONESUR 20 (A)  07/09/2016 0733   PROTEINUR 30 (A) 07/09/2016 0733   UROBILINOGEN 0.2 02/18/2014 0950   NITRITE NEGATIVE 07/09/2016 0733   LEUKOCYTESUR NEGATIVE 07/09/2016 0733      Imaging Results:    CT Angio Aortobifemoral W and/or Wo Contrast  Result Date: 05/17/2020 CLINICAL DATA:  Left heel nonhealing wound X 2 months. Diabetes, peripheral neuropathy, hypertension EXAM: CT ANGIOGRAPHY OF ABDOMINAL AORTA WITH ILIOFEMORAL RUNOFF TECHNIQUE: Multidetector CT imaging of the abdomen, pelvis and lower extremities was performed using the standard protocol during bolus administration of intravenous contrast. Multiplanar CT image reconstructions and MIPs were obtained to evaluate the vascular anatomy. CONTRAST:  164mL OMNIPAQUE IOHEXOL 350 MG/ML SOLN COMPARISON:  CT 07/09/2016 FINDINGS: VASCULAR Aorta: Moderate calcified atheromatous plaque. No aneurysm, dissection, or stenosis. Celiac: Calcified ostial plaque resulting in short segment stenosis of at least mild severity, patent distally with unremarkable branch anatomy. SMA: Calcified ostial plaque resulting in short segment mild stenosis, atheromatous but patent distally. Renals: Single left, patent. Single right, with calcified ostial plaque, no high-grade stenosis, patent distally. IMA: Origin occlusion. Distal branches reconstituted by visceral collaterals. RIGHT Lower Extremity Inflow: Common iliac moderate calcified atheromatous plaque, 1.5 cm diameter, no high-grade stenosis. Internal iliac ostial stenosis due to heavily calcified plaque. External iliac atheromatous, patent. Outflow: Common femoral atheromatous, patent. Deep femoral branches patent. SFA atheromatous proximal plaque resulting in mild stenosis, with scattered plaque throughout, stenosis at the adductor hiatus of at least moderate severity. Popliteal diffusely atheromatous with tandem areas of at least mild stenosis. Runoff: Peroneal diseased runoff with a prominent posterior communicating branch  supplying the foot. Anterior and posterior tibial artery proximal occlusion. LEFT Lower Extremity Inflow: Common iliac diffusely atheromatous without high-grade stenosis. Internal iliac heavily calcified proximal stenosis. External iliac mildly atheromatous, patent. Outflow: Common femoral atheromatous, patent. Deep femoral branches patent. SFA origin short-segment stenosis of at least mild severity, and distal segmental occlusion. Popliteal reconstitution at the adductor hiatus, with extensive atheromatous plaque throughout its length with tandem areas of at least moderate stenosis. Runoff: Anterior and posterior tibial artery proximal occlusion. Diseased peroneal runoff with prominent posterior communicating branch supplying the foot. Veins: No obvious venous abnormality within the limitations of this arterial phase study.1 Review of the MIP images confirms the  above findings. NON-VASCULAR Lower chest: No pleural or pericardial effusion. Visualized lung bases clear. Hepatobiliary: No focal liver abnormality is seen. Status post cholecystectomy. No biliary dilatation. Pancreas: Unremarkable. No pancreatic ductal dilatation or surrounding inflammatory changes. Spleen: Normal in size without focal abnormality. Adrenals/Urinary Tract: Adrenal glands unremarkable. 1.8 cm probable cyst, left upper pole kidney. No hydronephrosis. Urinary bladder is distended. Stomach/Bowel: Stomach and small bowel decompressed. Appendix surgically absent. The colon is nondilated, with a few sigmoid diverticula; no adjacent inflammatory change. Lymphatic: No abdominal or pelvic adenopathy localized. Reproductive: Prostate enlargement. Other: No ascites.  Bilateral pelvic phleboliths.  No free air. Musculoskeletal: Advanced multilevel lumbar spondylitic change. Solid-appearing PLIF L4-S1. no acute fracture or worrisome bone lesion. IMPRESSION: 1. Aortoiliac extensive atheromatous plaque without high-grade occlusion or stenosis. 2.  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 3. RIGHT SFA stenoses with diseased peroneal runoff. 4. LEFT SFA proximal stenosis and segmental distal occlusion, with reconstituted diseased peroneal runoff. Electronically Signed   By: Lucrezia Europe M.D.   On: 05/17/2020 12:51   DG Foot Complete Left  Result Date: 05/17/2020 CLINICAL DATA:  Pain.  Wound over heel EXAM: LEFT FOOT - COMPLETE 3+ VIEW COMPARISON:  Left ankle 03/11/2020. FINDINGS: Diffuse severe osteopenia and degenerative change. Question erosions of the medial aspect of the distal left second and third metatarsals. These findings may just be related to severe osteopenia. Clinical correlation suggested. No evidence of fracture or dislocation. Soft tissue wound noted over the heel. No adjacent bony erosion. No radiopaque foreign body. Peripheral vascular calcification. IMPRESSION: 1. Diffuse severe osteopenia and degenerative change. Questionable erosions of the medial aspect of the distal left second and third metatarsals. These findings may just be related to the severe osteopenia. Clinical correlation is suggested. No evidence of fracture or dislocation. 2. Soft tissue wound over the heel. No adjacent bony erosion. No radiopaque foreign body. Peripheral vascular calcification. Electronically Signed   By: Marcello Moores  Register   On: 05/17/2020 09:23       Assessment & Plan:    Active Problems:   Hypertension   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[place])   Tobacco use disorder   Diabetic foot infection ([place])   1. Diabetic foot infection 1. Started on Vanco and Zosyn in the ED 2. Continue Vanco 3. Blood cultures pending 4. CTA shows vascular disease 5. Vascular surgery consulted and recommends hospitalist admit to [facility] 6. Care instruction for nurse to let vascular surgery know when patient has arrived at [facility] 7. N.p.o. after midnight in case of procedure 8. Continue to monitor 2. Hyperglycemia in the  setting of diabetes mellitus 1. 8 units subcu insulin given in the ED 2. Patient takes 60 units of long-acting insulin at home, reducing that to 35 units in the setting of decreased appetite, n.p.o. after midnight, carb modified diet 3. Sliding scale coverage 4. Hemoglobin A1c pending 5. Continue to monitor 3. Hypertension 1. Continue losartan 4. History of TB 1. Unclear if patient finished RIPE regimen or not.  Patient reports that he stopped the medications because he did not feel like he needed them anymore 2. Infectious disease note reports that they were stopped in October because a different organism was grown, but then patient reports that health department had him continue these medications until he stopped them in February on his own 3. Negative pressure room 4. Chest x-ray 5.  Gold pending 5. Tobacco use disorder 1. Counseled on the importance of cessation 2. Declines nicotine patch  DVT Prophylaxis-   Heparin- SCDs AM Labs Ordered, also please review Full Orders  Family Communication: Admission, patients condition and plan of care including tests being ordered have been discussed with the patient and wife who indicate understanding and agree with the plan and Code Status.  Code Status:  Full  Admission status: Inpatient :The appropriate admission status for this patient is INPATIENT. Inpatient status is judged to be reasonable and necessary in order to provide the required intensity of service to ensure the patient's safety. The patient's presenting symptoms, physical exam findings, and initial radiographic and laboratory data in the context of their chronic comorbidities is felt to place them at high risk for further clinical deterioration. Furthermore, it is not anticipated that the patient will be medically stable for discharge from the hospital within 2 midnights of admission. The following factors support the admission status of inpatient.     The patient's presenting  symptoms include foot pain. The worrisome physical exam findings include ulcer left foot The initial radiographic and laboratory data are worrisome because of vascular disease  the chronic co-morbidities include diabetes and hypertension       * I certify that at the point of admission it is my clinical judgment that the patient will require inpatient hospital care spanning beyond 2 midnights from the point of admission due to high intensity of service, high risk for further deterioration and high frequency of surveillance required.*  Time spent in minutes : [place]

## 2020-05-17 NOTE — ED Notes (Signed)
[facility] here to take pt to [facility].

## 2020-05-17 NOTE — ED Notes (Signed)
MD in room speaking with patient when loud yelling heard. Upon entering room with security noted patient and wife to be arguing. Patient yelling at wife to leave. Asked by this nurse to leave. Wife yelling at patient "come on, let's go now." Wife escorted out of ED.

## 2020-05-17 NOTE — ED Notes (Signed)
Report given to [facility]  

## 2020-05-18 ENCOUNTER — Inpatient Hospital Stay (HOSPITAL_COMMUNITY): Payer: Medicare Other

## 2020-05-18 DIAGNOSIS — I739 Peripheral vascular disease, unspecified: Secondary | ICD-10-CM

## 2020-05-18 DIAGNOSIS — I70229 Atherosclerosis of native arteries of extremities with rest pain, unspecified extremity: Secondary | ICD-10-CM

## 2020-05-18 DIAGNOSIS — I779 Disorder of arteries and arterioles, unspecified: Secondary | ICD-10-CM

## 2020-05-18 DIAGNOSIS — E11628 Type 2 diabetes mellitus with other skin complications: Secondary | ICD-10-CM

## 2020-05-18 DIAGNOSIS — I10 Essential (primary) hypertension: Secondary | ICD-10-CM

## 2020-05-18 DIAGNOSIS — E1059 Type 1 diabetes mellitus with other circulatory complications: Secondary | ICD-10-CM

## 2020-05-18 DIAGNOSIS — I70244 Atherosclerosis of native arteries of left leg with ulceration of heel and midfoot: Secondary | ICD-10-CM

## 2020-05-18 DIAGNOSIS — L0390 Cellulitis, unspecified: Secondary | ICD-10-CM

## 2020-05-18 DIAGNOSIS — F17200 Nicotine dependence, unspecified, uncomplicated: Secondary | ICD-10-CM

## 2020-05-18 DIAGNOSIS — L089 Local infection of the skin and subcutaneous tissue, unspecified: Secondary | ICD-10-CM

## 2020-05-18 LAB — HEMOGLOBIN A1C
Hgb A1c MFr Bld: 9.5 % — ABNORMAL HIGH (ref 4.8–5.6)
Mean Plasma Glucose: 225.95 mg/dL

## 2020-05-18 LAB — COMPREHENSIVE METABOLIC PANEL
ALT: 11 U/L (ref 0–44)
AST: 19 U/L (ref 15–41)
Albumin: 3.5 g/dL (ref 3.5–5.0)
Alkaline Phosphatase: 115 U/L (ref 38–126)
Anion gap: 9 (ref 5–15)
BUN: 12 mg/dL (ref 8–23)
CO2: 28 mmol/L (ref 22–32)
Calcium: 9.4 mg/dL (ref 8.9–10.3)
Chloride: 103 mmol/L (ref 98–111)
Creatinine, Ser: 1.19 mg/dL (ref 0.61–1.24)
GFR, Estimated: >60 mL/min (ref >60)
Glucose, Bld: 54 mg/dL — ABNORMAL LOW (ref 70–99)
Potassium: 3.3 mmol/L — ABNORMAL LOW (ref 3.5–5.1)
Sodium: 140 mmol/L (ref 135–145)
Total Bilirubin: 1.3 mg/dL — ABNORMAL HIGH (ref 0.3–1.2)
Total Protein: 6.7 g/dL (ref 6.5–8.1)

## 2020-05-18 LAB — CBC
HCT: 30 % — ABNORMAL LOW (ref 39.0–52.0)
Hemoglobin: 10.2 g/dL — ABNORMAL LOW (ref 13.0–17.0)
MCH: 30.9 pg (ref 26.0–34.0)
MCHC: 34 g/dL (ref 30.0–36.0)
MCV: 90.9 fL (ref 80.0–100.0)
Platelets: 324 10*3/uL (ref 150–400)
RBC: 3.3 MIL/uL — ABNORMAL LOW (ref 4.22–5.81)
RDW: 12.9 % (ref 11.5–15.5)
WBC: 9.8 10*3/uL (ref 4.0–10.5)
nRBC: 0 % (ref 0.0–0.2)

## 2020-05-18 LAB — GLUCOSE, CAPILLARY
Glucose-Capillary: 166 mg/dL — ABNORMAL HIGH (ref 70–99)
Glucose-Capillary: 58 mg/dL — ABNORMAL LOW (ref 70–99)
Glucose-Capillary: 91 mg/dL (ref 70–99)
Glucose-Capillary: 111 mg/dL — ABNORMAL HIGH (ref 70–99)
Glucose-Capillary: 50 mg/dL — ABNORMAL LOW (ref 70–99)
Glucose-Capillary: 73 mg/dL (ref 70–99)
Glucose-Capillary: 166 mg/dL — ABNORMAL HIGH (ref 70–99)

## 2020-05-18 LAB — MAGNESIUM: Magnesium: 2 mg/dL (ref 1.7–2.4)

## 2020-05-18 MED ORDER — INSULIN GLARGINE 100 UNIT/ML ~~LOC~~ SOLN
25.0000 [IU] | Freq: Every day | SUBCUTANEOUS | Status: DC
Start: 2020-05-18 — End: 2020-05-19
  Administered 2020-05-18: 25 [IU] via SUBCUTANEOUS
  Filled 2020-05-18 (×2): qty 0.25

## 2020-05-18 MED ORDER — VANCOMYCIN HCL 750 MG/150ML IV SOLN
750.0000 mg | INTRAVENOUS | Status: DC
  Administered 2020-05-18 – 2020-05-20 (×3): 750 mg via INTRAVENOUS
  Filled 2020-05-18 (×4): qty 150

## 2020-05-18 MED ORDER — MORPHINE SULFATE (PF) 4 MG/ML IV SOLN
4.0000 mg | INTRAVENOUS | Status: DC | PRN
Start: 2020-05-18 — End: 2020-05-21

## 2020-05-18 MED ORDER — DONEPEZIL HCL 10 MG PO TABS
10.0000 mg | ORAL_TABLET | Freq: Every day | ORAL | Status: DC
  Administered 2020-05-18 – 2020-05-20 (×4): 10 mg via ORAL
  Filled 2020-05-18 (×4): qty 1

## 2020-05-18 MED ORDER — LOSARTAN POTASSIUM 50 MG PO TABS
50.0000 mg | ORAL_TABLET | Freq: Every day | ORAL | Status: DC
  Administered 2020-05-18 – 2020-05-20 (×4): 50 mg via ORAL
  Filled 2020-05-18 (×4): qty 1

## 2020-05-18 MED ORDER — ATORVASTATIN CALCIUM 40 MG PO TABS
40.0000 mg | ORAL_TABLET | Freq: Every day | ORAL | Status: DC
  Administered 2020-05-18 – 2020-05-20 (×4): 40 mg via ORAL
  Filled 2020-05-18 (×4): qty 1

## 2020-05-18 MED ORDER — ASPIRIN EC 81 MG PO TBEC
81.0000 mg | DELAYED_RELEASE_TABLET | Freq: Every day | ORAL | Status: DC
  Administered 2020-05-18 – 2020-05-20 (×4): 81 mg via ORAL
  Filled 2020-05-18 (×4): qty 1

## 2020-05-18 MED ORDER — INSULIN ASPART 100 UNIT/ML ~~LOC~~ SOLN
0.0000 [IU] | Freq: Three times a day (TID) | SUBCUTANEOUS | Status: DC
  Administered 2020-05-19: 1 [IU] via SUBCUTANEOUS
  Administered 2020-05-19: 3 [IU] via SUBCUTANEOUS
  Administered 2020-05-21: 5 [IU] via SUBCUTANEOUS

## 2020-05-18 MED ORDER — NICOTINE 14 MG/24HR TD PT24
14.0000 mg | MEDICATED_PATCH | Freq: Every day | TRANSDERMAL | Status: DC
  Administered 2020-05-18 – 2020-05-21 (×2): 14 mg via TRANSDERMAL
  Filled 2020-05-18 (×4): qty 1

## 2020-05-18 MED ORDER — INSULIN ASPART 100 UNIT/ML ~~LOC~~ SOLN
0.0000 [IU] | Freq: Every day | SUBCUTANEOUS | Status: DC
  Administered 2020-05-20: 2 [IU] via SUBCUTANEOUS

## 2020-05-18 MED ORDER — ONDANSETRON HCL 4 MG/2ML IJ SOLN: 4.0000 mg | Freq: Four times a day (QID) | INTRAMUSCULAR | Status: DC | PRN

## 2020-05-18 MED ORDER — OXYCODONE HCL 5 MG PO TABS
5.0000 mg | ORAL_TABLET | ORAL | Status: DC | PRN
  Administered 2020-05-18 – 2020-05-20 (×4): 5 mg via ORAL
  Filled 2020-05-18 (×4): qty 1

## 2020-05-18 MED ORDER — ACETAMINOPHEN 650 MG RE SUPP: 650.0000 mg | Freq: Four times a day (QID) | RECTAL | Status: DC | PRN

## 2020-05-18 MED ORDER — ACETAMINOPHEN 325 MG PO TABS
650.0000 mg | ORAL_TABLET | Freq: Four times a day (QID) | ORAL | Status: DC | PRN
  Administered 2020-05-18: 650 mg via ORAL
  Filled 2020-05-18: qty 2

## 2020-05-18 MED ORDER — PIPERACILLIN-TAZOBACTAM 3.375 G IVPB
3.3750 g | Freq: Three times a day (TID) | INTRAVENOUS | Status: DC
  Administered 2020-05-18 – 2020-05-21 (×9): 3.375 g via INTRAVENOUS
  Filled 2020-05-18 (×10): qty 50

## 2020-05-18 MED ORDER — INSULIN GLARGINE 100 UNIT/ML ~~LOC~~ SOLN
35.0000 [IU] | Freq: Every day | SUBCUTANEOUS | Status: DC
  Administered 2020-05-18: 35 [IU] via SUBCUTANEOUS
  Filled 2020-05-18 (×2): qty 0.35

## 2020-05-18 MED ORDER — HEPARIN SODIUM (PORCINE) 5000 UNIT/ML IJ SOLN
5000.0000 [IU] | Freq: Three times a day (TID) | INTRAMUSCULAR | Status: DC
  Administered 2020-05-18 – 2020-05-20 (×7): 5000 [IU] via SUBCUTANEOUS
  Filled 2020-05-18 (×7): qty 1

## 2020-05-18 MED ORDER — GABAPENTIN 100 MG PO CAPS
200.0000 mg | ORAL_CAPSULE | Freq: Three times a day (TID) | ORAL | Status: DC
  Administered 2020-05-18 – 2020-05-21 (×11): 200 mg via ORAL
  Filled 2020-05-18 (×11): qty 2

## 2020-05-18 MED ORDER — PIPERACILLIN-TAZOBACTAM 3.375 G IVPB 30 MIN
3.3750 g | Freq: Once | INTRAVENOUS | Status: AC
  Administered 2020-05-18: 3.375 g via INTRAVENOUS
  Filled 2020-05-18: qty 50

## 2020-05-18 MED ORDER — ONDANSETRON HCL 4 MG PO TABS: 4.0000 mg | ORAL_TABLET | Freq: Four times a day (QID) | ORAL | Status: DC | PRN

## 2020-05-18 NOTE — Consult Note (Signed)
REASON FOR CONSULT:    Peripheral vascular disease with left heel ulcer.  The consult is requested by [facility].  ASSESSMENT & PLAN:   CRITICAL LIMB ISCHEMIA LEFT LOWER EXTREMITY: This patient has rest pain of the left foot with a heel ulcer as noted below.  Given his diabetes and severe infrainguinal arterial occlusive disease he is clearly at risk for limb loss.  I have recommended that we proceed with arteriography and this will be scheduled for Friday.  I have reviewed with the patient the indications for arteriography. In addition, I have reviewed the potential complications of arteriography including but not limited to: Bleeding, arterial injury, arterial thrombosis, dye action, renal insufficiency, or other unpredictable medical problems. I have explained to the patient that if we find disease amenable to angioplasty we could potentially address this at the same time. I have discussed the potential complications of angioplasty and stenting, including but not limited to: Bleeding, arterial thrombosis, arterial injury, dissection, or the need for surgical intervention.  I have ordered ABIs.  We have discussed the importance of tobacco cessation.  He is on aspirin and a statin.  HISTORY OF CAROTID STENOSIS: This patient has a history of carotid stenosis according to the records.  He is asymptomatic.  I do not detect carotid bruits although I was using the disposable stethoscope.  I do not see that he has had a carotid duplex scan since 2017.  Therefore I will also order carotid duplex scan.  PROTEIN CALORIE MALNUTRITION: His BMI is 17.  He would obviously be at high risk for wound healing problems if he has any surgery.  I would recommend a nutrition consult.  Deitra Mayo, MD Office: [phone]   HPI:   Allen Yu is a pleasant 75 y.o. male, who bumped his left heel in February and developed a dry wound here.  He was seen at [facility] and transferred  here because he was noted to have severe peripheral vascular disease on his arteriogram.  He is currently on Zosyn and vancomycin.  Prior to this he does admit to a long history of bilateral calf claudication.  His symptoms are more significant on the left side.  His symptoms are brought on by ambulation and relieved with rest.  He has no significant hip or thigh claudication.  He also admits to rest pain of the left foot which has had for months.  His risk factors for peripheral vascular disease include diabetes, hypertension, and tobacco use.  He started smoking in [place] in the late 60s and smokes a pack per day.  He denies any family history of premature cardiovascular disease.  He denies any history of hypercholesterolemia.  He denies any previous history of myocardial infarction or history of congestive heart.[name]  Has has had no recent chest pain.  Past Medical History:  Diagnosis Date  . Anemia   . Arthritis   . Carotid stenosis   . Chronic back pain   . Chronic kidney disease    stones hx  . Dementia ([place])   . Diabetes mellitus   . GERD (gastroesophageal reflux disease)   . Hypertension   . Lung nodule   . Peripheral neuropathy   . PTSD (post-traumatic stress disorder)   . Schizophrenia ([place])   . Tuberculosis    MED TX X 1 YEAR YEARS AGO     Family History  Problem Relation Age of Onset  . Heart disease Mother  before age 46    SOCIAL HISTORY: Social History   Socioeconomic History  . Marital status: Married    Spouse name: Not on file  . Number of children: Not on file  . Years of education: Not on file  . Highest education level: Not on file  Occupational History  . Not on file  Tobacco Use  . Smoking status: Current Every Day Smoker    Packs/day: 1.00    Types: Cigarettes  . Smokeless tobacco: Never Used  Vaping Use  . Vaping Use: Never used  Substance and Sexual Activity  . Alcohol use: No    Alcohol/week: 0.0 standard drinks  . Drug use: Yes     Types: Marijuana    Comment: daily  . Sexual activity: Never  Other Topics Concern  . Not on file  Social History Narrative  . Not on file   Social Determinants of Health   Financial Resource Strain: Not on file  Food Insecurity: Not on file  Transportation Needs: Not on file  Physical Activity: Not on file  Stress: Not on file  Social Connections: Not on file  Intimate Partner Violence: Not on file    Allergies  Allergen Reactions  . Bee Venom Anaphylaxis  . Codeine Other (See Comments)    incoherent   . Diovan [Valsartan] Other (See Comments)    incoherent  . Propoxyphene Other (See Comments)    Dizziness, "Makes me feel drunk"    Current Facility-Administered Medications  Medication Dose Route Frequency Provider Last Rate Last Admin  . acetaminophen (TYLENOL) tablet 650 mg  650 mg Oral Q6H PRN Zierle-Ghosh, Asia B, DO       Or  . acetaminophen (TYLENOL) suppository 650 mg  650 mg Rectal Q6H PRN Zierle-Ghosh, Asia B, DO      . aspirin EC tablet 81 mg  81 mg Oral QHS Zierle-Ghosh, Asia B, DO   81 mg at 05/18/20 0113  . atorvastatin (LIPITOR) tablet 40 mg  40 mg Oral QHS Zierle-Ghosh, Asia B, DO   40 mg at 05/18/20 0113  . donepezil (ARICEPT) tablet 10 mg  10 mg Oral QHS Zierle-Ghosh, Asia B, DO   10 mg at 05/18/20 0113  . gabapentin (NEURONTIN) capsule 200 mg  200 mg Oral TID Zierle-Ghosh, Asia B, DO   200 mg at 05/18/20 0113  . heparin injection 5,000 Units  5,000 Units Subcutaneous Q8H Zierle-Ghosh, Asia B, DO      . insulin aspart (novoLOG) injection 0-5 Units  0-5 Units Subcutaneous QHS Zierle-Ghosh, Asia B, DO      . insulin aspart (novoLOG) injection 0-9 Units  0-9 Units Subcutaneous TID WC Zierle-Ghosh, Asia B, DO      . insulin glargine (LANTUS) injection 35 Units  35 Units Subcutaneous QHS Zierle-Ghosh, Asia B, DO   35 Units at 05/18/20 0302  . losartan (COZAAR) tablet 50 mg  50 mg Oral QHS Zierle-Ghosh, Asia B, DO   50 mg at 05/18/20 0113  . morphine 4 MG/ML  injection 4 mg  4 mg Intravenous Q3H PRN Zierle-Ghosh, Asia B, DO      . nicotine (NICODERM CQ - dosed in mg/24 hours) patch 14 mg  14 mg Transdermal Daily Zierle-Ghosh, Asia B, DO   14 mg at 05/18/20 0113  . ondansetron (ZOFRAN) tablet 4 mg  4 mg Oral Q6H PRN Zierle-Ghosh, Asia B, DO       Or  . ondansetron (ZOFRAN) injection 4 mg  4 mg Intravenous Q6H PRN  Zierle-Ghosh, Asia B, DO      . oxyCODONE (Oxy IR/ROXICODONE) immediate release tablet 5 mg  5 mg Oral Q4H PRN Zierle-Ghosh, Asia B, DO   5 mg at 05/18/20 0113  . vancomycin (VANCOREADY) IVPB 750 mg/150 mL  750 mg Intravenous Q24H Zierle-Ghosh, Asia B, DO        REVIEW OF SYSTEMS:  [X]  denotes positive finding, [ ]  denotes negative finding Cardiac  Comments:  Chest pain or chest pressure:    Shortness of breath upon exertion:    Short of breath when lying flat:    Irregular heart rhythm:        Vascular    Pain in calf, thigh, or hip brought on by ambulation: x   Pain in feet at night that wakes you up from your sleep:  x   Blood clot in your veins:    Leg swelling:         Pulmonary    Oxygen at home:    Productive cough:     Wheezing:         Neurologic    Sudden weakness in arms or legs:     Sudden numbness in arms or legs:     Sudden onset of difficulty speaking or slurred speech:    Temporary loss of vision in one eye:     Problems with dizziness:         Gastrointestinal    Blood in stool:     Vomited blood:         Genitourinary    Burning when urinating:     Blood in urine:        Psychiatric    Major depression:         Hematologic    Bleeding problems:    Problems with blood clotting too easily:        Skin    Rashes or ulcers: x       Constitutional    Fever or chills:     PHYSICAL EXAM:   Vitals:   05/17/20 1600 05/17/20 1630 05/17/20 2155 05/18/20 0035  BP: (!) 124/100 (!) 149/137 (!) 143/82 (!) 158/94  Pulse: 78 82 75 93  Resp: 17 17 16 18   Temp:   98 F (36.7 C) 99 F (37.2 C)   TempSrc:   Oral Oral  SpO2: 100% 100% 98%   Weight:      Height:       Body mass index is 17.02 kg/m.  GENERAL: The patient is a well-nourished male, in no acute distress. The vital signs are documented above. CARDIAC: There is a regular rate and rhythm.  VASCULAR: I do not detect carotid bruits. PULMONARY: There is good air exchange bilaterally without wheezing or rales. ABDOMEN: Soft and non-tender with normal pitched bowel sounds.  MUSCULOSKELETAL: There are no major deformities or cyanosis. NEUROLOGIC: No focal weakness or paresthesias are detected. SKIN: He has an ulcer on his left heel as noted on the photograph below.      PSYCHIATRIC: The patient has a normal affect.  DATA:    LABS: His GFR is 60.  Creatinine 1.26.  White blood cell count 6.7.  Platelets 260,000.   CT ANGIO: I have reviewed the images of his CT angiogram.  On the left side, which is the side of concern, he does have a diffuse calcific disease in the common iliac artery but no high-grade stenosis.  Likewise the external iliac artery has some diffuse calcific disease  but no high-grade stenosis.  There is a proximal internal iliac artery stenosis.  The left common femoral and deep femoral artery are patent.  There is a stenosis of the SFA origin and distal segmental occlusion of the superficial femoral artery.  The popliteal artery reconstitutes at the adductor hiatus with disease throughout the popliteal artery.  The anterior tibial and posterior tibial arteries have proximal occlusion.  There is diseased peroneal runoff with a collateral branch to the posterior tibial artery supplying the foot.

## 2020-05-18 NOTE — Progress Notes (Signed)
Carotid duplex bilateral study completed.   Please see CV Proc for preliminary results.   Mamoudou Mulvehill, RDMS, RVT  

## 2020-05-18 NOTE — Evaluation (Signed)
Occupational Therapy Evaluation Patient Details Name: Allen Yu MRN: [id number] DOB: [birth year] Today's Date: 05/18/2020    History of Present Illness 75 y.o. male, with history of tuberculosis, PTSD, hypertension, GERD, diabetes mellitus type 1, dementia, CKD, and more presented to the ED with a chief complaint of foot pain.   Clinical Impression   Patient admitted for the above diagnosis.  Currently he is being evaluated by vascular, and is scheduled for an arteriography this Friday 4/29.  PTA he lives with his spouse who able to assist as needed.  He used a cane for mobility, and was independent with ADL.  Barriers are listed below.  Currently his blood sugars are low, and he is needing up to [facility] for basic mobility, and up to Min A with lower body ADL.  The patient's POC will be modified pending the results of upcoming testing, but he is hoping to transition home when cleared medically.      Follow Up Recommendations  No OT follow up    Equipment Recommendations  None recommended by OT    Recommendations for Other Services       Precautions / Restrictions Precautions Precautions: Fall Restrictions Weight Bearing Restrictions: No      Mobility Bed Mobility Overal bed mobility: Needs Assistance Bed Mobility: Supine to Sit;Sit to Supine     Supine to sit: Supervision Sit to supine: Supervision        Transfers Overall transfer level: Needs assistance   Transfers: Sit to/from Stand Sit to Stand: Min guard         General transfer comment: patient shakey due to lower blood sugars, currently at 73 after juice.    Balance Overall balance assessment: Needs assistance Sitting-balance support: Feet supported;Single extremity supported Sitting balance-Leahy Scale: Fair     Standing balance support: Single extremity supported Standing balance-Leahy Scale: Poor Standing balance comment: needing external support for balance.                            ADL either performed or assessed with clinical judgement   ADL Overall ADL's : Needs assistance/impaired Eating/Feeding: Independent;Bed level               Upper Body Dressing : Set up;Sitting   Lower Body Dressing: Min guard;Sit to/from stand               Functional mobility during ADLs: Min guard General ADL Comments: Patient with low blood sugars at the moment.     Vision Baseline Vision/History: Wears glasses Wears Glasses: At all times Patient Visual Report: No change from baseline Additional Comments: Patient typically does not wear his glasses.     Perception     Praxis      Pertinent Vitals/Pain Pain Assessment: Faces Faces Pain Scale: Hurts little more Pain Location: L heel Pain Descriptors / Indicators: Tender Pain Intervention(s): Monitored during session     Hand Dominance Right   Extremity/Trunk Assessment Upper Extremity Assessment Upper Extremity Assessment: Overall WFL for tasks assessed   Lower Extremity Assessment Lower Extremity Assessment:  (diminished sensation to both feet.)   Cervical / Trunk Assessment Cervical / Trunk Assessment: Normal   Communication Communication Communication: No difficulties;HOH   Cognition Arousal/Alertness: Awake/alert Behavior During Therapy: WFL for tasks assessed/performed Overall Cognitive Status: Within Functional Limits for tasks assessed  General Comments: baseline forgetfulness.   General Comments   blood sugars up to 73 as of the eval, otherwise VSS on RA.     Exercises     Shoulder Instructions      Home Living Family/patient expects to be discharged to:: Private residence Living Arrangements: Spouse/significant other Available Help at Discharge: Family;Available 24 hours/day Type of Home: House Home Access: Stairs to enter [employer] of Steps: 3 to 4 depending on entrance.  Rails for backdoor entrance. He generally  goes through the garage.   Home Layout: Two level;Able to live on main level with bedroom/bathroom Alternate Level Stairs-Number of Steps: full flight Alternate Level Stairs-Rails: Left Bathroom Shower/Tub: Tub/shower unit   Bathroom Toilet: Standard     Home Equipment: Environmental consultant - 2 wheels;Cane - single point          Prior Functioning/Environment Level of Independence: Independent with assistive device(s)        Comments: Able to complete his own ADL, spouse assists with meals and home mangement.  Patient still drives.  Uses a SPC for mobility.        OT Problem List: Decreased activity tolerance;Impaired balance (sitting and/or standing);Impaired sensation;Pain      OT Treatment/Interventions: Self-care/ADL training;Therapeutic activities;Patient/family education;Balance training    OT Goals(Current goals can be found in the care plan section) Acute Rehab OT Goals Patient Stated Goal: Just get to feeling better and go back home. OT Goal Formulation: With patient Time For Goal Achievement: 06/01/20 Potential to Achieve Goals: Good ADL Goals Pt Will Perform Grooming: sitting;standing;with modified independence Pt Will Perform Lower Body Bathing: sit to/from stand;with modified independence Pt Will Perform Lower Body Dressing: with modified independence;sit to/from stand Pt Will Transfer to Toilet: with modified independence;regular height toilet;ambulating Pt Will Perform Toileting - Clothing Manipulation and hygiene: with modified independence;sit to/from stand  OT Frequency: Min 2X/week   Barriers to D/C:    none noted       Co-evaluation              AM-PAC OT "6 Clicks" Daily Activity     Outcome Measure Help from another person eating meals?: None Help from another person taking care of personal grooming?: None Help from another person toileting, which includes using toliet, bedpan, or urinal?: A Little Help from another person bathing (including  washing, rinsing, drying)?: A Little Help from another person to put on and taking off regular upper body clothing?: None Help from another person to put on and taking off regular lower body clothing?: A Little 6 Click Score: 21   End of Session Nurse Communication: Mobility status  Activity Tolerance: Patient tolerated treatment well Patient left: in bed;with call bell/phone within reach;with family/visitor present  OT Visit Diagnosis: Unsteadiness on feet (R26.81);Pain Pain - Right/Left: Left Pain - part of body: Ankle and joints of foot                Time: [id number] OT Time Calculation (min): 20 min Charges:  OT General Charges $OT Visit: 1 Visit OT Evaluation $OT Eval Moderate Complexity: 1 Mod  05/18/2020  Rich, OTR/L  Acute Rehabilitation Services  Office:  [place] 05/18/2020, 5:31 PM

## 2020-05-18 NOTE — Progress Notes (Signed)
Carotid duplex bilateral study completed.   Please see CV Proc for preliminary results.   Blake Vetrano, RDMS, RVT  

## 2020-05-18 NOTE — Progress Notes (Signed)
Inpatient Diabetes Program Recommendations  AACE/ADA: New Consensus Statement on Inpatient Glycemic Control (2015)  Target Ranges:  Prepandial:   less than 140 mg/dL      Peak postprandial:   less than 180 mg/dL (1-2 hours)      Critically ill patients:  140 - 180 mg/dL   Lab Results  Component Value Date   GLUCAP 91 05/18/2020   HGBA1C 9.5 (H) 05/18/2020    Review of Glycemic Control Results for LEHMAN, WHITELEY (MRN [id number]) as of 05/18/2020 10:09  Ref. Range 05/17/2020 12:04 05/17/2020 15:52 05/17/2020 16:48 05/18/2020 01:12 05/18/2020 08:16 05/18/2020 08:54  Glucose-Capillary Latest Ref Range: 70 - 99 mg/dL 304 (H) Novolog 8 units 61 (L) D50 103 (H) 166 (H) Lantus 35 units @ 0302 58 (L) 91   Diabetes history: DM2 Outpatient Diabetes medications: Lantus 60 units + Novolog 5-10 units as needed for high blood sugars Current orders for Inpatient glycemic control: Lantus 35 units qd + Novolog 0-9 units tid + 0-5 units hs  Inpatient Diabetes Program Recommendations:   -Decrease Lantus to 25 units qd  Secure chat sent to Dr. [place]  Thank you, Nani Gasser. Nakiyah Beverley, RN, MSN, CDE  Diabetes Coordinator Inpatient Glycemic Control Team Team Pager [phone] (8am-5pm) 05/18/2020 10:13 AM

## 2020-05-18 NOTE — Progress Notes (Signed)
ABI has been completed.  Results can be found under chart review under CV PROC. 05/18/2020 3:26 PM Alejandro Adcox RVT, RDMS

## 2020-05-18 NOTE — Progress Notes (Signed)
PROGRESS NOTE    Allen Yu  [id number] DOB: September 29, 1945 DOA: 05/17/2020 PCP: [name], MD    Brief Narrative:  Allen Yu is a 75 year old male with past medical history of tuberculosis, PTSD, essential hypertension, GERD, type 1 diabetes mellitus, dementia, CKD who presents to the ED with complaint of foot pain.  Patient has reported 4 months of persistent foot pain and ulcer on the heel of his left foot.  He has been to his PCP and podiatrist to told him "there is nothing to do for it".  He reports has taken antibiotics for it.  Patient has attempted to treat with creams, heel balls, and dressings.  He reports occasional bloody discharge and malodorous discharge.  Patient denies fevers, no chest pain, no palpitations, no shortness of breath, no abdominal pain.  In the ED, temperature 97.5 F, HR 78, RR 15, BP 137/76, SPO2 100% on room air.  Sodium 134, potassium 3.5, CO2 26, chloride 99, BUN 15, creatinine 1.26, glucose 330.  Lactic acid 0.8.  WBC 6.7, hemoglobin 9.4, platelets 260.  SARS-CoV-2 PCR negative.  Influenza A/B PCR negative.  Left foot x-ray with diffuse severe osteopenia and degenerative change, questional erosion medial aspect left distal second and third metatarsals, soft tissue wound over the heel with no adjacent bony erosion and no radiopaque foreign body.  CT angiogram aortobifemoral with embolic contrast with extensive atherosclerotic plaque without high-grade occlusion or stenosis and bilateral internal iliac artery high-grade stenosis, left/right SFA stenosis.  Vascular surgery was consulted, hospital service consulted for further evaluation and management of diabetic foot infection.   Assessment & Plan:   Active Problems:   Hypertension   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HCC)   Tobacco use disorder   Diabetic foot infection ([place])   Diabetic foot infection Left lower extremity critical limb ischemia Patient presenting to the ED with left foot  pain with associated heel ulcer that been progressing for the last 4 months.  Patient is afebrile without leukocytosis.  Left foot x-ray with diffuse severe osteopenia/degenerative change with questionable erosions medial aspect distal left second/third metatarsal and soft tissue wound over heel. ABI with mild RLE arterial disease and LLE with non-compressible arteries w/ absent toe brachial index.  --Vascular surgery following, appreciate assistance --Vancomycin/Zosyn; pharmacy consulted for dosing/monitoring --Continue aspirin 81 mg p.o. daily and atorvastatin 40 mg p.o. daily --Plan abdominal aortogram 05/20/20 with Dr. Scot Dock  Type 2 diabetes mellitus, with hyperglycemia Hemoglobin A1c 9.5 on 05/18/2020, poorly controlled.  Home regimen includes 60 units qPM, Humalog 5-10 units TID prn. --Hypoglycemic event this morning --Lantus 25 unit subcutaneously daily --SSI for furthetr coverage --CBG qAC/HS  Essential hypertension --Losartan 50 mg p.o. nightly  History of tuberculosis Patient reports has been treated twice for tuberculosis infection.  Patient follows with infectious disease, Dr. Megan Salon outpatient.  Last seen in clinic December 2021 in which medications were discontinued due to nausea/vomiting and weight loss.  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  Patient is asymptomatic, chest x-ray with right upper lobe linear density improved likely scarring and no infiltrate or cavitary lesion.  Discussed with ID, Dr. Gale Journey on 4/27; no further treatment indicated at this time; and can remove airborne isolation precautions. --Outpatient follow-up with ID  CKD stage IIIa Baseline creatinine 1.2-1.3. -- Avoid nephrotoxins, renally dose all medications  Severe protein calorie malnutrition Body mass index is 16.87 kg/m.  Evidenced by cachexia and muscle wasting. --nutrition  consult  Dementia --Donezepil 10 mg  p.o. nightly  Tobacco use disorder Counseled on need for complete cessation given diabetic wound.  Declines nicotine patch.   DVT prophylaxis: Heparin   Code Status: Full Code Family Communication: Updated patient's family present at bedside this morning.  Disposition Plan:  Level of care: Med-Surg Status is: Inpatient  Remains inpatient appropriate because:Ongoing diagnostic testing needed not appropriate for outpatient work up, Unsafe d/c plan, IV treatments appropriate due to intensity of illness or inability to take PO and Inpatient level of care appropriate due to severity of illness   Dispo: The patient is from: Home              Anticipated d/c is to: Home              Patient currently is not medically stable to d/c.   Difficult to place patient No   Consultants:   Vascular surgery  Procedures:   ABIs bilateral lower extremity  Carotid duplex ultrasound  Antimicrobials:   Vancomycin 4/26>>  Zosyn 4/26>>   Subjective: Patient seen examined bedside, resting comfortably.  Complains of pain to left heel.  Seen by vascular surgery, Dr. Scot Dock this morning with plan for abdominal aortogram on Friday.  Continues on IV antibiotics.  No other questions or concerns at this time.  Denies headache, no chest pain, palpitations, shortness of breath, no abdominal pain, no fever/chills/night sweats.  No acute concerns overnight per nursing staff.  Objective: Vitals:   05/18/20 0636 05/18/20 0906 05/18/20 0935 05/18/20 1212  BP: 129/68 115/77 (!) 116/55 119/80  Pulse:  89  82  Resp:  16  16  Temp: 98 F (36.7 C) 98.1 F (36.7 C)  98.5 F (36.9 C)  TempSrc: Oral Oral  Oral  SpO2: 98% 98%  100%  Weight: 58 kg     Height:        Intake/Output Summary (Last 24 hours) at 05/18/2020 1626 Last data filed at 05/18/2020 1503 Gross per 24 hour  Intake 240 ml  Output 100 ml  Net 140 ml   Filed Weights   05/17/20 0805 05/18/20 0636  Weight: 58.5 kg 58 kg     Examination:  General exam: Appears calm and comfortable, thin/cachectic in appearance, appears older than stated age Respiratory system: Clear to auscultation. Respiratory effort normal.  On room air Cardiovascular system: S1 & S2 heard, RRR. No JVD, murmurs, rubs, gallops or clicks. No pedal edema. Gastrointestinal system: Abdomen is nondistended, soft and nontender. No organomegaly or masses felt. Normal bowel sounds heard. Central nervous system: Alert. No focal neurological deficits. Extremities: Symmetric 5 x 5 power. Skin: Necrotic ulcer left heel, malodorous drainage, no surrounding erythema; otherwise no other ulcerations noted. Psychiatry: Judgement and insight appear poor. Mood & affect appropriate.         Data Reviewed: I have personally reviewed following labs and imaging studies  CBC: Recent Labs  Lab 05/17/20 0842 05/18/20 0845  WBC 6.7 9.8  NEUTROABS 5.1  --   HGB 9.4* 10.2*  HCT 27.4* 30.0*  MCV 91.9 90.9  PLT 260 0000000   Basic Metabolic Panel: Recent Labs  Lab 05/17/20 0842 05/18/20 0845  NA 134* 140  K 3.5 3.3*  CL 99 103  CO2 26 28  GLUCOSE 330* 54*  BUN 15 12  CREATININE 1.26* 1.19  CALCIUM 9.1 9.4  MG  --  2.0   GFR: Estimated Creatinine Clearance: 44.7 mL/min (by C-G formula based on SCr of 1.19 mg/dL).  Liver Function Tests: Recent Labs  Lab 05/18/20 0845  AST 19  ALT 11  ALKPHOS 115  BILITOT 1.3*  PROT 6.7  ALBUMIN 3.5   No results for input(s): LIPASE, AMYLASE in the last 168 hours. No results for input(s): AMMONIA in the last 168 hours. Coagulation Profile: No results for input(s): INR, PROTIME in the last 168 hours. Cardiac Enzymes: No results for input(s): CKTOTAL, CKMB, CKMBINDEX, TROPONINI in the last 168 hours. BNP (last 3 results) No results for input(s): PROBNP in the last 8760 hours. HbA1C: Recent Labs    05/18/20 0845  HGBA1C 9.5*   CBG: Recent Labs  Lab 05/17/20 1648 05/18/20 0112 05/18/20 0816  05/18/20 0854 05/18/20 1211  GLUCAP 103* 166* 58* 91 111*   Lipid Profile: No results for input(s): CHOL, HDL, LDLCALC, TRIG, CHOLHDL, LDLDIRECT in the last 72 hours. Thyroid Function Tests: No results for input(s): TSH, T4TOTAL, FREET4, T3FREE, THYROIDAB in the last 72 hours. Anemia Panel: No results for input(s): VITAMINB12, FOLATE, FERRITIN, TIBC, IRON, RETICCTPCT in the last 72 hours. Sepsis Labs: Recent Labs  Lab 05/17/20 0842  LATICACIDVEN 0.8    Recent Results (from the past 240 hour(s))  Resp Panel by RT-PCR (Flu A&B, Covid) Nasopharyngeal Swab     Status: None   Collection Time: 05/17/20  9:10 AM   Specimen: Nasopharyngeal Swab; Nasopharyngeal(NP) swabs in vial transport medium  Result Value Ref Range Status   SARS Coronavirus 2 by RT PCR NEGATIVE NEGATIVE Final    Comment: (NOTE) SARS-CoV-2 target nucleic acids are NOT DETECTED.  The SARS-CoV-2 RNA is generally detectable in upper respiratory specimens during the acute phase of infection. The lowest concentration of SARS-CoV-2 viral copies this assay can detect is 138 copies/mL. A negative result does not preclude SARS-Cov-2 infection and should not be used as the sole basis for treatment or other patient management decisions. A negative result may occur with  improper specimen collection/handling, submission of specimen other than nasopharyngeal swab, presence of viral mutation(s) within the areas targeted by this assay, and inadequate number of viral copies(<138 copies/mL). A negative result must be combined with clinical observations, patient history, and epidemiological information. The expected result is Negative.  Fact Sheet for Patients:  EntrepreneurPulse.com.au  Fact Sheet for Healthcare Providers:  IncredibleEmployment.be  This test is no t yet approved or cleared by the [place] FDA and  has been authorized for detection and/or diagnosis of SARS-CoV-2 by FDA  under an Emergency Use Authorization (EUA). This EUA will remain  in effect (meaning this test can be used) for the duration of the COVID-19 declaration under Section 564(b)(1) of the Act, 21 U.S.C.section 360bbb-3(b)(1), unless the authorization is terminated  or revoked sooner.       Influenza A by PCR NEGATIVE NEGATIVE Final   Influenza B by PCR NEGATIVE NEGATIVE Final    Comment: (NOTE) The Xpert Xpress SARS-CoV-2/FLU/RSV plus assay is intended as an aid in the diagnosis of influenza from Nasopharyngeal swab specimens and should not be used as a sole basis for treatment. Nasal washings and aspirates are unacceptable for Xpert Xpress SARS-CoV-2/FLU/RSV testing.  Fact Sheet for Patients: EntrepreneurPulse.com.au  Fact Sheet for Healthcare Providers: IncredibleEmployment.be  This test is not yet approved or cleared by the [place] FDA and has been authorized for detection and/or diagnosis of SARS-CoV-2 by FDA under an Emergency Use Authorization (EUA). This EUA will remain in effect (meaning this test can be used) for the duration of the COVID-19 declaration under Section 564(b)(1) of  the Act, 21 U.S.C. section 360bbb-3(b)(1), unless the authorization is terminated or revoked.  Performed at [facility], [address], [place], [place] [address]   Culture, blood (routine x 2)     Status: None (Preliminary result)   Collection Time: 05/17/20  9:37 AM   Specimen: BLOOD LEFT HAND  Result Value Ref Range Status   Specimen Description BLOOD LEFT HAND  Final   Special Requests   Final    BOTTLES DRAWN AEROBIC AND ANAEROBIC Blood Culture results may not be optimal due to an inadequate volume of blood received in culture bottles   Culture   Final    NO GROWTH < 24 HOURS Performed at [facility], [address], [place], [place] [address]    Report Status PENDING  Incomplete  Culture, blood (routine x 2)     Status: None (Preliminary  result)   Collection Time: 05/17/20  9:38 AM   Specimen: BLOOD RIGHT HAND  Result Value Ref Range Status   Specimen Description BLOOD RIGHT HAND  Final   Special Requests   Final    BOTTLES DRAWN AEROBIC AND ANAEROBIC Blood Culture adequate volume   Culture   Final    NO GROWTH < 24 HOURS Performed at [facility], [address] [place], [place] [address]    Report Status PENDING  Incomplete         Radiology Studies: CT Angio Aortobifemoral W and/or Wo Contrast  Result Date: 05/17/2020 CLINICAL DATA:  Left heel nonhealing wound X 2 months. Diabetes, peripheral neuropathy, hypertension EXAM: CT ANGIOGRAPHY OF ABDOMINAL AORTA WITH ILIOFEMORAL RUNOFF TECHNIQUE: Multidetector CT imaging of the abdomen, pelvis and lower extremities was performed using the standard protocol during bolus administration of intravenous contrast. Multiplanar CT image reconstructions and MIPs were obtained to evaluate the vascular anatomy. CONTRAST:  12mL OMNIPAQUE IOHEXOL 350 MG/ML SOLN COMPARISON:  CT 07/09/2016 FINDINGS: VASCULAR Aorta: Moderate calcified atheromatous plaque. No aneurysm, dissection, or stenosis. Celiac: Calcified ostial plaque resulting in short segment stenosis of at least mild severity, patent distally with unremarkable branch anatomy. SMA: Calcified ostial plaque resulting in short segment mild stenosis, atheromatous but patent distally. Renals: Single left, patent. Single right, with calcified ostial plaque, no high-grade stenosis, patent distally. IMA: Origin occlusion. Distal branches reconstituted by visceral collaterals. RIGHT Lower Extremity Inflow: Common iliac moderate calcified atheromatous plaque, 1.5 cm diameter, no high-grade stenosis. Internal iliac ostial stenosis due to heavily calcified plaque. External iliac atheromatous, patent. Outflow: Common femoral atheromatous, patent. Deep femoral branches patent. SFA atheromatous proximal plaque resulting in mild stenosis, with scattered  plaque throughout, stenosis at the adductor hiatus of at least moderate severity. Popliteal diffusely atheromatous with tandem areas of at least mild stenosis. Runoff: Peroneal diseased runoff with a prominent posterior communicating branch supplying the foot. Anterior and posterior tibial artery proximal occlusion. LEFT Lower Extremity Inflow: Common iliac diffusely atheromatous without high-grade stenosis. Internal iliac heavily calcified proximal stenosis. External iliac mildly atheromatous, patent. Outflow: Common femoral atheromatous, patent. Deep femoral branches patent. SFA origin short-segment stenosis of at least mild severity, and distal segmental occlusion. Popliteal reconstitution at the adductor hiatus, with extensive atheromatous plaque throughout its length with tandem areas of at least moderate stenosis. Runoff: Anterior and posterior tibial artery proximal occlusion. Diseased peroneal runoff with prominent posterior communicating branch supplying the foot. Veins: No obvious venous abnormality within the limitations of this arterial phase study.1 Review of the MIP images confirms the above findings. NON-VASCULAR Lower chest: No pleural or pericardial effusion. Visualized  lung bases clear. Hepatobiliary: No focal liver abnormality is seen. Status post cholecystectomy. No biliary dilatation. Pancreas: Unremarkable. No pancreatic ductal dilatation or surrounding inflammatory changes. Spleen: Normal in size without focal abnormality. Adrenals/Urinary Tract: Adrenal glands unremarkable. 1.8 cm probable cyst, left upper pole kidney. No hydronephrosis. Urinary bladder is distended. Stomach/Bowel: Stomach and small bowel decompressed. Appendix surgically absent. The colon is nondilated, with a few sigmoid diverticula; no adjacent inflammatory change. Lymphatic: No abdominal or pelvic adenopathy localized. Reproductive: Prostate enlargement. Other: No ascites.  Bilateral pelvic phleboliths.  No free air.  Musculoskeletal: Advanced multilevel lumbar spondylitic change. Solid-appearing PLIF L4-S1. no acute fracture or worrisome bone lesion. IMPRESSION: 1. Aortoiliac extensive atheromatous plaque without high-grade occlusion or stenosis. 2. 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 3. RIGHT SFA stenoses with diseased peroneal runoff. 4. LEFT SFA proximal stenosis and segmental distal occlusion, with reconstituted diseased peroneal runoff. Electronically Signed   By: Lucrezia Europe M.D.   On: 05/17/2020 12:51   DG CHEST PORT 1 VIEW  Result Date: 05/18/2020 CLINICAL DATA:  History of TB. EXAM: PORTABLE CHEST 1 VIEW COMPARISON:  Chest x-ray 03/06/2019.  CT 06/30/2019. FINDINGS: Mediastinum hilar structures normal. Linear density previously noted the right upper lung has improved with minimal residual pleuroparenchymal thickening most likely scarring. No focal infiltrate. No pleural effusion or pneumothorax. Heart size normal. Prior cervical spine fusion. Surgical clips right upper quadrant. IMPRESSION: Linear density previously noted in the right upper lung is improved with minimal residual pleuroparenchymal thickening most likely scarring. No focal infiltrate or cavitary lesion. Electronically Signed   By: Marcello Moores  Register   On: 05/18/2020 07:15   DG Foot Complete Left  Result Date: 05/17/2020 CLINICAL DATA:  Pain.  Wound over heel EXAM: LEFT FOOT - COMPLETE 3+ VIEW COMPARISON:  Left ankle 03/11/2020. FINDINGS: Diffuse severe osteopenia and degenerative change. Question erosions of the medial aspect of the distal left second and third metatarsals. These findings may just be related to severe osteopenia. Clinical correlation suggested. No evidence of fracture or dislocation. Soft tissue wound noted over the heel. No adjacent bony erosion. No radiopaque foreign body. Peripheral vascular calcification. IMPRESSION: 1. Diffuse severe osteopenia and degenerative change. Questionable erosions of the medial  aspect of the distal left second and third metatarsals. These findings may just be related to the severe osteopenia. Clinical correlation is suggested. No evidence of fracture or dislocation. 2. Soft tissue wound over the heel. No adjacent bony erosion. No radiopaque foreign body. Peripheral vascular calcification. Electronically Signed   By: Marcello Moores  Register   On: 05/17/2020 09:23   VAS [place] ABI WITH/WO TBI  Result Date: 05/18/2020  LOWER EXTREMITY DOPPLER STUDY Patient Name:  Allen Yu  Date of Exam:   05/18/2020 Medical Rec #: [id number]       Accession #:    [id number] Date of Birth: June 24, 1945      Patient Gender: M Patient Age:   32Y Exam Location:  [facility] Procedure:      VAS [place] ABI WITH/WO TBI Referring Phys: [place] --------------------------------------------------------------------------------  Indications: Rest pain, ulceration, and Patient is a transfer from [facility],              severe PVD seen on arteriogram. High Risk         Hypertension, Diabetes, current smoker, coronary artery Factors:          disease. Other Factors: Carotid stenosis, CKD, neuropathy, PAD.  Comparison Study: No previous exams Performing Technologist: Hill,  Jody RVT, RDMS  Examination Guidelines: A complete evaluation includes at minimum, Doppler waveform signals and systolic blood pressure reading at the level of bilateral brachial, anterior tibial, and posterior tibial arteries, when vessel segments are accessible. Bilateral testing is considered an integral part of a complete examination. Photoelectric Plethysmograph (PPG) waveforms and toe systolic pressure readings are included as required and additional duplex testing as needed. Limited examinations for reoccurring indications may be performed as noted.  ABI Findings: +---------+------------------+-----+----------+--------+ Right    Rt Pressure (mmHg)IndexWaveform  Comment   +---------+------------------+-----+----------+--------+ Brachial                        biphasic  IV       +---------+------------------+-----+----------+--------+ PTA      122               0.82 biphasic           +---------+------------------+-----+----------+--------+ DP       126               0.85 monophasic         +---------+------------------+-----+----------+--------+ Great Toe15                0.10 Abnormal           +---------+------------------+-----+----------+--------+ +---------+------------------+-----+-------------------+-------+ Left     Lt Pressure (mmHg)IndexWaveform           Comment +---------+------------------+-----+-------------------+-------+ Brachial 148                    triphasic                  +---------+------------------+-----+-------------------+-------+ PTA      198               1.34 monophasic                 +---------+------------------+-----+-------------------+-------+ DP       188               1.27 dampened monophasic        +---------+------------------+-----+-------------------+-------+ Great Toe0                 0.00 Absent                     +---------+------------------+-----+-------------------+-------+  Summary: Right: Resting right ankle-brachial index indicates mild right lower extremity arterial disease. The right toe-brachial index is essentially zero. Left: Resting left ankle-brachial index indicates noncompressible left lower extremity arteries and could underestimate severity of disease. The left toe-brachial index is absent. ABIs are unreliable.  *See table(s) above for measurements and observations.     Preliminary         Scheduled Meds: . aspirin EC  81 mg Oral QHS  . atorvastatin  40 mg Oral QHS  . donepezil  10 mg Oral QHS  . gabapentin  200 mg Oral TID  . heparin  5,000 Units Subcutaneous Q8H  . insulin aspart  0-5 Units Subcutaneous QHS  . insulin aspart  0-9 Units Subcutaneous TID  WC  . insulin glargine  25 Units Subcutaneous QHS  . losartan  50 mg Oral QHS  . nicotine  14 mg Transdermal Daily   Continuous Infusions: . piperacillin-tazobactam (ZOSYN)  IV    . vancomycin 750 mg (05/18/20 1545)     LOS: 1 day    Time spent: 42 minutes spent on chart review, discussion with nursing staff, consultants, updating family and interview/physical exam;  more than 50% of that time was spent in counseling and/or coordination of care.    Karisma Meiser J [place] DO [facility] Available via Epic secure chat 7am-7pm After these hours, please refer to coverage provider listed on [email or web address] 05/18/2020, 4:26 PM

## 2020-05-18 NOTE — Progress Notes (Signed)
Hypoglycemic Event  CBG: 58  Treatment:4oz juice  Symptoms: Asymptomatic  Follow-up CBG: [id number] CBG Result: 91  Possible Reasons for Event:   Comments: 4oz of juice and crackers given to pt. Pt asymptomatic.     Allen Yu

## 2020-05-18 NOTE — Progress Notes (Signed)
Hypoglycemic Event  CBG: 50  Treatment: 8oz juice  Symptoms: tremors/confusion   Follow-up CBG: Time: 1651 CBG Result: 73  Possible Reasons for Event: unknown  Comments: Administered juice and crackers. Dinner tray ordered.     Allen Yu

## 2020-05-19 DIAGNOSIS — E43 Unspecified severe protein-calorie malnutrition: Secondary | ICD-10-CM | POA: Diagnosis present

## 2020-05-19 DIAGNOSIS — E46 Unspecified protein-calorie malnutrition: Secondary | ICD-10-CM | POA: Diagnosis present

## 2020-05-19 LAB — COMPREHENSIVE METABOLIC PANEL
ALT: 10 U/L (ref 0–44)
AST: 16 U/L (ref 15–41)
Albumin: 3 g/dL — ABNORMAL LOW (ref 3.5–5.0)
Alkaline Phosphatase: 99 U/L (ref 38–126)
Anion gap: 9 (ref 5–15)
BUN: 17 mg/dL (ref 8–23)
CO2: 26 mmol/L (ref 22–32)
Calcium: 8.9 mg/dL (ref 8.9–10.3)
Chloride: 102 mmol/L (ref 98–111)
Creatinine, Ser: 1.31 mg/dL — ABNORMAL HIGH (ref 0.61–1.24)
GFR, Estimated: 57 mL/min — ABNORMAL LOW (ref >60)
Glucose, Bld: 65 mg/dL — ABNORMAL LOW (ref 70–99)
Potassium: 2.9 mmol/L — ABNORMAL LOW (ref 3.5–5.1)
Sodium: 137 mmol/L (ref 135–145)
Total Bilirubin: 0.8 mg/dL (ref 0.3–1.2)
Total Protein: 6 g/dL — ABNORMAL LOW (ref 6.5–8.1)

## 2020-05-19 LAB — MAGNESIUM
Magnesium: 1.9 mg/dL (ref 1.7–2.4)
Magnesium: 1.9 mg/dL (ref 1.7–2.4)

## 2020-05-19 LAB — HEMOGLOBIN A1C
Hgb A1c MFr Bld: 9.2 % — ABNORMAL HIGH (ref 4.8–5.6)
Mean Plasma Glucose: 217.34 mg/dL

## 2020-05-19 LAB — GLUCOSE, CAPILLARY
Glucose-Capillary: 45 mg/dL — ABNORMAL LOW (ref 70–99)
Glucose-Capillary: 152 mg/dL — ABNORMAL HIGH (ref 70–99)
Glucose-Capillary: 59 mg/dL — ABNORMAL LOW (ref 70–99)
Glucose-Capillary: 215 mg/dL — ABNORMAL HIGH (ref 70–99)
Glucose-Capillary: 130 mg/dL — ABNORMAL HIGH (ref 70–99)
Glucose-Capillary: 89 mg/dL (ref 70–99)

## 2020-05-19 LAB — CBC
HCT: 25.5 % — ABNORMAL LOW (ref 39.0–52.0)
Hemoglobin: 8.7 g/dL — ABNORMAL LOW (ref 13.0–17.0)
MCH: 30.7 pg (ref 26.0–34.0)
MCHC: 34.1 g/dL (ref 30.0–36.0)
MCV: 90.1 fL (ref 80.0–100.0)
Platelets: 277 10*3/uL (ref 150–400)
RBC: 2.83 MIL/uL — ABNORMAL LOW (ref 4.22–5.81)
RDW: 13 % (ref 11.5–15.5)
WBC: 8.6 10*3/uL (ref 4.0–10.5)
nRBC: 0 % (ref 0.0–0.2)

## 2020-05-19 LAB — LIPID PANEL
Cholesterol: 118 mg/dL (ref 0–200)
HDL: 40 mg/dL — ABNORMAL LOW (ref >40)
LDL Cholesterol: 68 mg/dL (ref 0–99)
Total CHOL/HDL Ratio: 3 RATIO
Triglycerides: 52 mg/dL (ref <150)
VLDL: 10 mg/dL (ref 0–40)

## 2020-05-19 LAB — POTASSIUM: Potassium: 4.5 mmol/L (ref 3.5–5.1)

## 2020-05-19 MED ORDER — POTASSIUM CHLORIDE CRYS ER 20 MEQ PO TBCR
30.0000 meq | EXTENDED_RELEASE_TABLET | ORAL | Status: AC
  Administered 2020-05-19 (×3): 30 meq via ORAL
  Filled 2020-05-19 (×3): qty 1

## 2020-05-19 MED ORDER — ENSURE ENLIVE PO LIQD
237.0000 mL | Freq: Three times a day (TID) | ORAL | Status: DC
  Administered 2020-05-19 – 2020-05-21 (×4): 237 mL via ORAL

## 2020-05-19 MED ORDER — INSULIN GLARGINE 100 UNIT/ML ~~LOC~~ SOLN
20.0000 [IU] | Freq: Every day | SUBCUTANEOUS | Status: DC
  Filled 2020-05-19: qty 0.2

## 2020-05-19 NOTE — TOC Initial Note (Signed)
Transition of Care [facility]) - Initial/Assessment Note    Patient Details  Name: Allen Yu MRN: [id number] Date of Birth: February 14, 1945  [facility] CM/SW Contact:    Trula Ore, [place] Phone Number: 05/19/2020, 1:04 PM  Clinical Narrative:                  CSW received consult for possible SNF placement at time of discharge. CSW spoke with patient at bedside regarding PT recommendation of SNF placement at time of discharge. Patient comes from home with spouse and grandchildren. Patient expressed understanding of PT recommendation and declined SNF placement at time of discharge. Patient is interested in home health services. CSW let patient know that case manager will follow up with him about home health services. CSW informed case Freight forwarder.  No further questions reported at this time. CSW to continue to follow and assist with discharge planning needs.   Expected Discharge Plan: [place] Barriers to Discharge: Continued Medical Work up   Patient Goals and CMS Choice Patient states their goals for this hospitalization and ongoing recovery are:: to go home with hh services CMS Medicare.gov Compare Post Acute Care list provided to:: Patient Choice offered to / list presented to : Patient  Expected Discharge Plan and Services Expected Discharge Plan: [place] In-house Referral: Clinical Social Work     Living arrangements for the past 2 months: [place]                                      Prior Living Arrangements/Services Living arrangements for the past 2 months: Single Family Home Lives with:: Self,Spouse,Other (Comment) (patient comes from home with spouse and grandchildren) Patient language and need for interpreter reviewed:: Yes Do you feel safe going back to the place where you live?: Yes      Need for Family Participation in Patient Care: Yes (Comment) Care giver support system in place?: Yes  (comment)   Criminal Activity/Legal Involvement Pertinent to Current Situation/Hospitalization: No - Comment as needed  Activities of Daily Living Home Assistive Devices/Equipment: None ADL Screening (condition at time of admission) Patient's cognitive ability adequate to safely complete daily activities?: Yes Is the patient deaf or have difficulty hearing?: Yes Does the patient have difficulty seeing, even when wearing glasses/contacts?: No Does the patient have difficulty concentrating, remembering, or making decisions?: Yes Patient able to express need for assistance with ADLs?: Yes Does the patient have difficulty dressing or bathing?: Yes Independently performs ADLs?: No Dressing (OT): Needs assistance Is this a change from baseline?: Pre-admission baseline Grooming: Needs assistance Is this a change from baseline?: Pre-admission baseline Feeding: Independent Bathing: Independent Toileting: Independent In/Out Bed: Needs assistance Is this a change from baseline?: Pre-admission baseline Walks in Home: Needs assistance Is this a change from baseline?: Pre-admission baseline Does the patient have difficulty walking or climbing stairs?: Yes Weakness of Legs: Both Weakness of Arms/Hands: Both  Permission Sought/Granted Permission sought to share information with : Case Manager,Family Chief Financial Officer       Permission granted to share info w AGENCY: HH        Emotional Assessment Appearance:: Appears stated age Attitude/Demeanor/Rapport: Gracious Affect (typically observed): Calm Orientation: : Oriented to Self,Oriented to Place,Oriented to Situation Alcohol / Substance Use: Not Applicable Psych Involvement: No (comment)  Admission diagnosis:  Diabetic foot infection ([place]) [C12.751, L08.9] Poorly  controlled type 1 diabetes mellitus (HCC) [E10.65] 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 Diabetic foot infection ([place]) 05/17/2020  . Plantar callus 12/24/2019  . Cavitary pneumonia 09/14/2019  . 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 Unintentional weight loss 09/14/2019  . Coronary artery calcification seen on CT scan 10/28/2018  . Hilar adenopathy 04/10/2016  . Lung nodule seen on imaging study 07/09/2015  . Acute encephalopathy   . Hypoglycemia due to insulin   . Hypertension 07/08/2015  . 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[place]) 07/08/2015  . AKI (acute kidney injury) ([place]) 07/08/2015  . Normocytic anemia 07/08/2015  . Tobacco use disorder 07/08/2015  . Diverticulosis of colon without hemorrhage   . Left inguinal hernia 09/16/2013  . Altered mental status 11/17/2012  . Hypothermia 11/17/2012  . Leukocytosis 11/17/2012  . CKD (chronic kidney disease), stage II   . PTSD (post-traumatic stress disorder)   . GERD (gastroesophageal reflux disease)    PCP:  [name], MD Pharmacy:   [facility] - [address], [place] [address] Phone: [phone] Fax: [place], [place] [address] AT [place] [address] [place] [address] Phone: [phone] Fax: [phone]     Social Determinants of Health (SDOH) Interventions    Readmission Risk Interventions No flowsheet data found.

## 2020-05-19 NOTE — H&P (View-Only) (Signed)
   VASCULAR SURGERY ASSESSMENT & PLAN:   CRITICAL LIMB ISCHEMIA LEFT LOWER EXTREMITY: This patient has left pain of the left foot with a heel ulcer.  He has evidence of infrainguinal arterial occlusive disease.  He is scheduled for an arteriogram tomorrow with possible angioplasty and stenting.  I have discussed with him the indications for the procedure and the potential complications and he is agreeable to proceed.  HISTORY OF CAROTID STENOSIS: His carotid duplex scan shows no significant carotid disease on either side.  HYPOKALEMIA: His potassium this morning was 2.9.  I will defer the treatment of this to the medical service.  He is on aspirin and a statin.  I have written preop orders.   SUBJECTIVE:   No specific complaints.  PHYSICAL EXAM:   Vitals:   05/18/20 1212 05/18/20 2036 05/19/20 0000 05/19/20 0508  BP: 119/80 110/80 116/64 (!) 128/56  Pulse: 82 90 82 80  Resp: 16 19 18 15  Temp: 98.5 F (36.9 C) 99 F (37.2 C) 97.9 F (36.6 C) 97.9 F (36.6 C)  TempSrc: Oral Oral Oral Oral  SpO2: 100% 96% 95% 98%  Weight:    55.5 kg  Height:       No change to the wound on the left heel.  LABS:   ARTERIAL DOPPLER STUDY: I have reviewed his arterial Doppler study from yesterday.  On the right side he has a biphasic posterior tibial signal with a monophasic dorsalis pedis signal.  ABIs 85%.  Toe pressures 15.  On the left side, which is the side of concern, there is a monophasic posterior tibial signal.  There is a dampened monophasic dorsalis pedis signal.  The arteries are calcified and not compressible thus ABIs are not reliable.  Toe pressure is 0.  CAROTID DUPLEX: I have reviewed his carotid duplex scan from yesterday.   On the right side he has a less than 39% stenosis.  The right vertebral artery is patent with antegrade flow.  On the left side he has a less than 39% stenosis.  The left vertebral artery is patent with antegrade flow.  Lab Results  Component  Value Date   WBC 8.6 05/19/2020   HGB 8.7 (L) 05/19/2020   HCT 25.5 (L) 05/19/2020   MCV 90.1 05/19/2020   PLT 277 05/19/2020   Lab Results  Component Value Date   CREATININE 1.31 (H) 05/19/2020   Lab Results  Component Value Date   INR 1.0 07/10/2019   CBG (last 3)  Recent Labs    05/18/20 1630 05/18/20 1651 05/18/20 2146  GLUCAP 50* 73 166*    PROBLEM LIST:    Active Problems:   Hypertension   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HCC)   Tobacco use disorder   Diabetic foot infection (HCC)   CURRENT MEDS:   . aspirin EC  81 mg Oral QHS  . atorvastatin  40 mg Oral QHS  . donepezil  10 mg Oral QHS  . gabapentin  200 mg Oral TID  . heparin  5,000 Units Subcutaneous Q8H  . insulin aspart  0-5 Units Subcutaneous QHS  . insulin aspart  0-9 Units Subcutaneous TID WC  . insulin glargine  20 Units Subcutaneous QHS  . losartan  50 mg Oral QHS  . nicotine  14 mg Transdermal Daily  . potassium chloride  30 mEq Oral Q3H    Randon Somera Office: [phone] 05/19/2020  

## 2020-05-19 NOTE — Progress Notes (Signed)
   VASCULAR SURGERY ASSESSMENT & PLAN:   CRITICAL LIMB ISCHEMIA LEFT LOWER EXTREMITY: This patient has left pain of the left foot with a heel ulcer.  He has evidence of infrainguinal arterial occlusive disease.  He is scheduled for an arteriogram tomorrow with possible angioplasty and stenting.  I have discussed with him the indications for the procedure and the potential complications and he is agreeable to proceed.  HISTORY OF CAROTID STENOSIS: His carotid duplex scan shows no significant carotid disease on either side.  HYPOKALEMIA: His potassium this morning was 2.9.  I will defer the treatment of this to the medical service.  He is on aspirin and a statin.  I have written preop orders.   SUBJECTIVE:   No specific complaints.  PHYSICAL EXAM:   Vitals:   05/18/20 1212 05/18/20 2036 05/19/20 0000 05/19/20 0508  BP: 119/80 110/80 116/64 (!) 128/56  Pulse: 82 90 82 80  Resp: 16 19 18 15  Temp: 98.5 F (36.9 C) 99 F (37.2 C) 97.9 F (36.6 C) 97.9 F (36.6 C)  TempSrc: Oral Oral Oral Oral  SpO2: 100% 96% 95% 98%  Weight:    55.5 kg  Height:       No change to the wound on the left heel.  LABS:   ARTERIAL DOPPLER STUDY: I have reviewed his arterial Doppler study from yesterday.  On the right side he has a biphasic posterior tibial signal with a monophasic dorsalis pedis signal.  ABIs 85%.  Toe pressures 15.  On the left side, which is the side of concern, there is a monophasic posterior tibial signal.  There is a dampened monophasic dorsalis pedis signal.  The arteries are calcified and not compressible thus ABIs are not reliable.  Toe pressure is 0.  CAROTID DUPLEX: I have reviewed his carotid duplex scan from yesterday.   On the right side he has a less than 39% stenosis.  The right vertebral artery is patent with antegrade flow.  On the left side he has a less than 39% stenosis.  The left vertebral artery is patent with antegrade flow.  Lab Results  Component  Value Date   WBC 8.6 05/19/2020   HGB 8.7 (L) 05/19/2020   HCT 25.5 (L) 05/19/2020   MCV 90.1 05/19/2020   PLT 277 05/19/2020   Lab Results  Component Value Date   CREATININE 1.31 (H) 05/19/2020   Lab Results  Component Value Date   INR 1.0 07/10/2019   CBG (last 3)  Recent Labs    05/18/20 1630 05/18/20 1651 05/18/20 2146  GLUCAP 50* 73 166*    PROBLEM LIST:    Active Problems:   Hypertension   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HCC)   Tobacco use disorder   Diabetic foot infection (HCC)   CURRENT MEDS:   . aspirin EC  81 mg Oral QHS  . atorvastatin  40 mg Oral QHS  . donepezil  10 mg Oral QHS  . gabapentin  200 mg Oral TID  . heparin  5,000 Units Subcutaneous Q8H  . insulin aspart  0-5 Units Subcutaneous QHS  . insulin aspart  0-9 Units Subcutaneous TID WC  . insulin glargine  20 Units Subcutaneous QHS  . losartan  50 mg Oral QHS  . nicotine  14 mg Transdermal Daily  . potassium chloride  30 mEq Oral Q3H    [place] Office: [phone] 05/19/2020  

## 2020-05-19 NOTE — Evaluation (Signed)
Physical Therapy Evaluation Patient Details Name: Allen Yu MRN: [id number] DOB: July 14, 1945 Today's Date: 05/19/2020   History of Present Illness  75 y.o. male presents to [facility] ED on 05/17/2020 with L foot pain. Pt admitted for diabetic foot infection and hyperglycemia. CTA results demonstrate significant vascular disease. PMH includes tuberculosis, PTSD, hypertension, GERD, diabetes mellitus type 1, dementia, CKD.  Clinical Impression  Pt presents to PT with deficits in gait, balance, endurance, strength, power, safety awareness, and with a history of multiple falls. Pt is very unsteady when ambulating both with and without UE support. Pt requires physical assistance at all times when standing to prevent potential falls. Pt reports this is not far from his baseline and does report a history of falling. PT recommends SNF placement due to falls risk, in an effort to improve balance and safety awareness, pt does not seem agreeable to this. If pt refuses SNF placement then PT recommends HHPT services and physical assistance for all out of bed mobility.    Follow Up Recommendations SNF;Supervision/Assistance - 24 hour (pt likely to refuse SNF, will benefit from HHPT if agreeable)    Equipment Recommendations  Wheelchair (measurements PT) (wheelchair may reduce risk of falls if pt elects to D/C home)    Recommendations for Other Services       Precautions / Restrictions Precautions Precautions: Fall Restrictions Weight Bearing Restrictions: No      Mobility  Bed Mobility Overal bed mobility: Needs Assistance Bed Mobility: Supine to Sit     Supine to sit: Min assist;HOB elevated     General bed mobility comments: use of railing    Transfers Overall transfer level: Needs assistance Equipment used: Rolling walker (2 wheeled);None Transfers: Sit to/from Stand Sit to Stand: Min assist         General transfer comment: use of railing, HHA, or RW. Increased lateral sway, lack of  LE power resulting in need for assistive devices  Ambulation/Gait Ambulation/Gait assistance: Min assist Gait Distance (Feet): 20 Feet (additional trial of 10') Assistive device: Rolling walker (2 wheeled);IV Pole Gait Pattern/deviations: Ataxic;Step-through pattern;Drifts right/left Gait velocity: reduced Gait velocity interpretation: <1.31 ft/sec, indicative of household ambulator General Gait Details: pt with inconsistent step length and width throughout gait, reliant on UE support of assistive devices and PT physical assistance to improve balance  Stairs            Wheelchair Mobility    Modified Rankin (Stroke Patients Only)       Balance Overall balance assessment: Needs assistance Sitting-balance support: No upper extremity supported;Feet supported Sitting balance-Leahy Scale: Good     Standing balance support: Single extremity supported;Bilateral upper extremity supported Standing balance-Leahy Scale: Poor Standing balance comment: minA with UE support of railing or walker                             Pertinent Vitals/Pain Pain Assessment: Faces Faces Pain Scale: Hurts a little bit Pain Location: L foot Pain Descriptors / Indicators: Grimacing Pain Intervention(s): Monitored during session    Home Living Family/patient expects to be discharged to:: Private residence Living Arrangements: Spouse/significant other Available Help at Discharge: Family;Friend(s);Available 24 hours/day Type of Home: House Home Access: Stairs to enter Entrance Stairs-Rails: Left (left rail at garage) Entrance Stairs-Number of Steps: 3 to 4 depending on entrance.  Rails for backdoor entrance. He generally goes through the garage. Home Layout: Two level;Able to live on main level with bedroom/bathroom Home Equipment: Gilford Rile -  2 wheels;Cane - single point      Prior Function Level of Independence: Needs assistance   Gait / Transfers Assistance Needed: pt reports  ambulating short distances with cane or RW, takes people with him when ambulating longer distances at stores. Pt does report falling often  ADL's / Homemaking Assistance Needed: pt performs ADLs independently, requires assistance for IADLs        Hand Dominance   Dominant Hand: Right    Extremity/Trunk Assessment   Upper Extremity Assessment Upper Extremity Assessment: Defer to OT evaluation    Lower Extremity Assessment Lower Extremity Assessment: Generalized weakness    Cervical / Trunk Assessment Cervical / Trunk Assessment: Normal  Communication   Communication: HOH  Cognition Arousal/Alertness: Awake/alert Behavior During Therapy: WFL for tasks assessed/performed Overall Cognitive Status: Impaired/Different from baseline Area of Impairment: Safety/judgement;Awareness                         Safety/Judgement: Decreased awareness of safety;Decreased awareness of deficits Awareness: Emergent          General Comments General comments (skin integrity, edema, etc.): tachy into 110s with activity    Exercises     Assessment/Plan    PT Assessment Patient needs continued PT services  PT Problem List Decreased strength;Decreased activity tolerance;Decreased balance;Decreased mobility;Decreased cognition;Decreased knowledge of use of DME;Decreased safety awareness;Decreased knowledge of precautions;Pain       PT Treatment Interventions DME instruction;Gait training;Stair training;Functional mobility training;Therapeutic activities;Therapeutic exercise;Balance training;Neuromuscular re-education;Patient/family education;Cognitive remediation    PT Goals (Current goals can be found in the Care Plan section)  Acute Rehab PT Goals Patient Stated Goal: to go home PT Goal Formulation: With patient Time For Goal Achievement: 06/02/20 Potential to Achieve Goals: Fair    Frequency Min 3X/week (maintain 3x/wk as pt likely to refuse SNF)   Barriers to discharge         Co-evaluation               AM-PAC PT "6 Clicks" Mobility  Outcome Measure Help needed turning from your back to your side while in a flat bed without using bedrails?: A Little Help needed moving from lying on your back to sitting on the side of a flat bed without using bedrails?: A Little Help needed moving to and from a bed to a chair (including a wheelchair)?: A Little Help needed standing up from a chair using your arms (e.g., wheelchair or bedside chair)?: A Little Help needed to walk in hospital room?: A Little Help needed climbing 3-5 steps with a railing? : A Lot 6 Click Score: 17    End of Session   Activity Tolerance: Patient limited by fatigue Patient left: in chair;with call bell/phone within reach;with chair alarm set Nurse Communication: Mobility status PT Visit Diagnosis: Unsteadiness on feet (R26.81);Other abnormalities of gait and mobility (R26.89);Muscle weakness (generalized) (M62.81);History of falling (Z91.81)    Time: 0865-7846 PT Time Calculation (min) (ACUTE ONLY): 30 min   Charges:   PT Evaluation $PT Eval Low Complexity: 1 Low PT Treatments $Therapeutic Activity: 8-22 mins        Zenaida Niece, PT, DPT Acute Rehabilitation Pager: [phone]   Zenaida Niece 05/19/2020, 12:09 PM

## 2020-05-19 NOTE — H&P (View-Only) (Signed)
   VASCULAR SURGERY ASSESSMENT & PLAN:   CRITICAL LIMB ISCHEMIA LEFT LOWER EXTREMITY: This patient has left pain of the left foot with a heel ulcer.  He has evidence of infrainguinal arterial occlusive disease.  He is scheduled for an arteriogram tomorrow with possible angioplasty and stenting.  I have discussed with him the indications for the procedure and the potential complications and he is agreeable to proceed.  HISTORY OF CAROTID STENOSIS: His carotid duplex scan shows no significant carotid disease on either side.  HYPOKALEMIA: His potassium this morning was 2.9.  I will defer the treatment of this to the medical service.  He is on aspirin and a statin.  I have written preop orders.   SUBJECTIVE:   No specific complaints.  PHYSICAL EXAM:   Vitals:   05/18/20 1212 05/18/20 2036 05/19/20 0000 05/19/20 0508  BP: 119/80 110/80 116/64 (!) 128/56  Pulse: 82 90 82 80  Resp: 16 19 18 15   Temp: 98.5 F (36.9 C) 99 F (37.2 C) 97.9 F (36.6 C) 97.9 F (36.6 C)  TempSrc: Oral Oral Oral Oral  SpO2: 100% 96% 95% 98%  Weight:    55.5 kg  Height:       No change to the wound on the left heel.  LABS:   ARTERIAL DOPPLER STUDY: I have reviewed his arterial Doppler study from yesterday.  On the right side he has a biphasic posterior tibial signal with a monophasic dorsalis pedis signal.  ABIs 85%.  Toe pressures 15.  On the left side, which is the side of concern, there is a monophasic posterior tibial signal.  There is a dampened monophasic dorsalis pedis signal.  The arteries are calcified and not compressible thus ABIs are not reliable.  Toe pressure is 0.  CAROTID DUPLEX: I have reviewed his carotid duplex scan from yesterday.   On the right side he has a less than 39% stenosis.  The right vertebral artery is patent with antegrade flow.  On the left side he has a less than 39% stenosis.  The left vertebral artery is patent with antegrade flow.  Lab Results  Component  Value Date   WBC 8.6 05/19/2020   HGB 8.7 (L) 05/19/2020   HCT 25.5 (L) 05/19/2020   MCV 90.1 05/19/2020   PLT 277 05/19/2020   Lab Results  Component Value Date   CREATININE 1.31 (H) 05/19/2020   Lab Results  Component Value Date   INR 1.0 07/10/2019   CBG (last 3)  Recent Labs    05/18/20 1630 05/18/20 1651 05/18/20 2146  GLUCAP 50* 73 166*    PROBLEM LIST:    Active Problems:   Hypertension   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HCC)   Tobacco use disorder   Diabetic foot infection (HCC)   CURRENT MEDS:   . aspirin EC  81 mg Oral QHS  . atorvastatin  40 mg Oral QHS  . donepezil  10 mg Oral QHS  . gabapentin  200 mg Oral TID  . heparin  5,000 Units Subcutaneous Q8H  . insulin aspart  0-5 Units Subcutaneous QHS  . insulin aspart  0-9 Units Subcutaneous TID WC  . insulin glargine  20 Units Subcutaneous QHS  . losartan  50 mg Oral QHS  . nicotine  14 mg Transdermal Daily  . potassium chloride  30 mEq Oral [place] Office: [phone] 05/19/2020

## 2020-05-19 NOTE — Progress Notes (Signed)
Hypoglycemic Event  CBG: 45  Treatment: 8 oz juice/soda  Symptoms: Hungry  Follow-up CBG: EXNT:[id number] CBG Result: 59  Possible Reasons for Event: Unknown   Hypoglycemic Event  CBG: 59   Treatment: 4 oz juice/soda  Symptoms: Hungry  Follow-up CBG: Time:152 CBG Result:0925  Possible Reasons for Event: Unknown    Allen Yu

## 2020-05-19 NOTE — Progress Notes (Addendum)
PROGRESS NOTE    Allen Yu  [id number] DOB: [birth year] DOA: 05/17/2020 PCP: [name], MD    Brief Narrative:  Allen Yu is a 75 year old male with past medical history of tuberculosis, PTSD, essential hypertension, GERD, type 1 diabetes mellitus, dementia, CKD who presents to the ED with complaint of foot pain.  Patient has reported 4 months of persistent foot pain and ulcer on the heel of his left foot.  He has been to his PCP and podiatrist to told him "there is nothing to do for it".  He reports has taken antibiotics for it.  Patient has attempted to treat with creams, heel balls, and dressings.  He reports occasional bloody discharge and malodorous discharge.  Patient denies fevers, no chest pain, no palpitations, no shortness of breath, no abdominal pain.  In the ED, temperature 97.5 F, HR 78, RR 15, BP 137/76, SPO2 100% on room air.  Sodium 134, potassium 3.5, CO2 26, chloride 99, BUN 15, creatinine 1.26, glucose 330.  Lactic acid 0.8.  WBC 6.7, hemoglobin 9.4, platelets 260.  SARS-CoV-2 PCR negative.  Influenza A/B PCR negative.  Left foot x-ray with diffuse severe osteopenia and degenerative change, questional erosion medial aspect left distal second and third metatarsals, soft tissue wound over the heel with no adjacent bony erosion and no radiopaque foreign body.  CT angiogram aortobifemoral with embolic contrast with extensive atherosclerotic plaque without high-grade occlusion or stenosis and bilateral internal iliac artery high-grade stenosis, left/right SFA stenosis.  Vascular surgery was consulted, hospital service consulted for further evaluation and management of diabetic foot infection.   Assessment & Plan:   Active Problems:   Hypertension   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HCC)   Tobacco use disorder   Diabetic foot infection (HCC)   Protein-calorie malnutrition, severe   Diabetic foot infection Left lower extremity critical limb  ischemia Patient presenting to the ED with left foot pain with associated heel ulcer that been progressing for the last 4 months.  Patient is afebrile without leukocytosis.  Left foot x-ray with diffuse severe osteopenia/degenerative change with questionable erosions medial aspect distal left second/third metatarsal and soft tissue wound over heel. ABI with mild RLE arterial disease and LLE with non-compressible arteries w/ absent toe brachial index.  --Vascular surgery following, appreciate assistance --Vancomycin/Zosyn; pharmacy consulted for dosing/monitoring --Continue aspirin 81 mg p.o. daily and atorvastatin 40 mg p.o. daily --Plan abdominal aortogram 05/20/20 with Dr. Scot Dock  Type 2 diabetes mellitus, with hyperglycemia Hemoglobin A1c 9.5 on 05/18/2020, poorly controlled.  Home regimen includes 60 units qPM, Humalog 5-10 units TID prn. --Hypoglycemic event this morning --Will dc Lantus tonight as will be NPO for surgery 4/29 and hypoglycemia today --SSI for further coverage --CBG qAC/HS  Essential hypertension --Losartan 50 mg p.o. nightly  History of tuberculosis Patient reports has been treated twice for tuberculosis infection.  Patient follows with infectious disease, Dr. Megan Salon outpatient.  Last seen in clinic December 2021 in which medications were discontinued due to nausea/vomiting and weight loss.  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  Patient is asymptomatic, chest x-ray with right upper lobe linear density improved likely scarring and no infiltrate or cavitary lesion.  Discussed with ID, Dr. Gale Journey on 4/27; no further treatment indicated at this time; and can remove airborne isolation precautions. --Outpatient follow-up with ID  CKD stage IIIa Baseline creatinine 1.2-1.3. -- Avoid nephrotoxins, renally dose all medications  Severe protein calorie malnutrition  Body mass index is 16.14 kg/m.   Evidenced by cachexia and muscle wasting. Nutrition Status: Nutrition Problem: Severe Malnutrition Etiology: chronic illness (hx of tuberculosis) Signs/Symptoms: severe fat depletion,severe muscle depletion Interventions: Ensure Enlive (each supplement provides 350kcal and 20 grams of protein) --nutrition following  Dementia --Donezepil 10 mg p.o. nightly  Tobacco use disorder Counseled on need for complete cessation given diabetic wound.  Declines nicotine patch.   DVT prophylaxis: Heparin   Code Status: Full Code Family Communication: No family present at bedside this morning  Disposition Plan:  Level of care: Med-Surg Status is: Inpatient  Remains inpatient appropriate because:Ongoing diagnostic testing needed not appropriate for outpatient work up, Unsafe d/c plan, IV treatments appropriate due to intensity of illness or inability to take PO and Inpatient level of care appropriate due to severity of illness   Dispo: The patient is from: Home              Anticipated d/c is to: Home              Patient currently is not medically stable to d/c.   Difficult to place patient No   Consultants:   Vascular surgery  Procedures:   ABIs bilateral lower extremity  Carotid duplex ultrasound  Antimicrobials:   Vancomycin 4/26>>  Zosyn 4/26>>   Subjective: Patient seen examined bedside, resting comfortably.  Complains of pain to left heel.  Seen by vascular surgery, Dr. Scot Dock this morning with plan for abdominal aortogram on Friday.  Continues on IV antibiotics.  No other questions or concerns at this time.  Denies headache, no chest pain, palpitations, shortness of breath, no abdominal pain, no fever/chills/night sweats.  No acute concerns overnight per nursing staff.  Objective: Vitals:   05/19/20 0000 05/19/20 0508 05/19/20 0819 05/19/20 1239  BP: 116/64 (!) 128/56 140/79 (!) 169/91  Pulse: 82 80 86 (!) 106  Resp: 18 15 16 16   Temp: 97.9 F (36.6 C) 97.9 F  (36.6 C) 97.9 F (36.6 C)   TempSrc: Oral Oral Oral   SpO2: 95% 98% 95% 95%  Weight:  55.5 kg    Height:        Intake/Output Summary (Last 24 hours) at 05/19/2020 1443 Last data filed at 05/19/2020 1200 Gross per 24 hour  Intake 940 ml  Output 900 ml  Net 40 ml   Filed Weights   05/17/20 0805 05/18/20 0636 05/19/20 0508  Weight: 58.5 kg 58 kg 55.5 kg    Examination:  General exam: Appears calm and comfortable, thin/cachectic in appearance, appears older than stated age Respiratory system: Clear to auscultation. Respiratory effort normal.  On room air Cardiovascular system: S1 & S2 heard, RRR. No JVD, murmurs, rubs, gallops or clicks. No pedal edema. Gastrointestinal system: Abdomen is nondistended, soft and nontender. No organomegaly or masses felt. Normal bowel sounds heard. Central nervous system: Alert. No focal neurological deficits. Extremities: Symmetric 5 x 5 power. Skin: Necrotic ulcer left heel, malodorous drainage, no surrounding erythema; otherwise no other ulcerations noted. Psychiatry: Judgement and insight appear poor. Mood & affect appropriate.         Data Reviewed: I have personally reviewed following labs and imaging studies  CBC: Recent Labs  Lab 05/17/20 0842 05/18/20 0845 05/19/20 0236  WBC 6.7 9.8 8.6  NEUTROABS 5.1  --   --   HGB 9.4* 10.2* 8.7*  HCT 27.4* 30.0* 25.5*  MCV 91.9 90.9 90.1  PLT 260 324 299   Basic Metabolic Panel: Recent Labs  Lab  05/17/20 0842 05/18/20 0845 05/19/20 0236  NA 134* 140 137  K 3.5 3.3* 2.9*  CL 99 103 102  CO2 26 28 26   GLUCOSE 330* 54* 65*  BUN 15 12 17   CREATININE 1.26* 1.19 1.31*  CALCIUM 9.1 9.4 8.9  MG  --  2.0 1.9   GFR: Estimated Creatinine Clearance: 38.8 mL/min (A) (by C-G formula based on SCr of 1.31 mg/dL (H)). Liver Function Tests: Recent Labs  Lab 05/18/20 0845 05/19/20 0236  AST 19 16  ALT 11 10  ALKPHOS 115 99  BILITOT 1.3* 0.8  PROT 6.7 6.0*  ALBUMIN 3.5 3.0*   No  results for input(s): LIPASE, AMYLASE in the last 168 hours. No results for input(s): AMMONIA in the last 168 hours. Coagulation Profile: No results for input(s): INR, PROTIME in the last 168 hours. Cardiac Enzymes: No results for input(s): CKTOTAL, CKMB, CKMBINDEX, TROPONINI in the last 168 hours. BNP (last 3 results) No results for input(s): PROBNP in the last 8760 hours. HbA1C: Recent Labs    05/18/20 0845 05/19/20 0236  HGBA1C 9.5* 9.2*   CBG: Recent Labs  Lab 05/18/20 2146 05/19/20 0815 05/19/20 0831 05/19/20 0925 05/19/20 1152  GLUCAP 166* 45* 59* 152* 215*   Lipid Profile: Recent Labs    05/19/20 0236  CHOL 118  HDL 40*  LDLCALC 68  TRIG 52  CHOLHDL 3.0   Thyroid Function Tests: No results for input(s): TSH, T4TOTAL, FREET4, T3FREE, THYROIDAB in the last 72 hours. Anemia Panel: No results for input(s): VITAMINB12, FOLATE, FERRITIN, TIBC, IRON, RETICCTPCT in the last 72 hours. Sepsis Labs: Recent Labs  Lab 05/17/20 0842  LATICACIDVEN 0.8    Recent Results (from the past 240 hour(s))  Resp Panel by RT-PCR (Flu A&B, Covid) Nasopharyngeal Swab     Status: None   Collection Time: 05/17/20  9:10 AM   Specimen: Nasopharyngeal Swab; Nasopharyngeal(NP) swabs in vial transport medium  Result Value Ref Range Status   SARS Coronavirus 2 by RT PCR NEGATIVE NEGATIVE Final    Comment: (NOTE) SARS-CoV-2 target nucleic acids are NOT DETECTED.  The SARS-CoV-2 RNA is generally detectable in upper respiratory specimens during the acute phase of infection. The lowest concentration of SARS-CoV-2 viral copies this assay can detect is 138 copies/mL. A negative result does not preclude SARS-Cov-2 infection and should not be used as the sole basis for treatment or other patient management decisions. A negative result may occur with  improper specimen collection/handling, submission of specimen other than nasopharyngeal swab, presence of viral mutation(s) within the areas  targeted by this assay, and inadequate number of viral copies(<138 copies/mL). A negative result must be combined with clinical observations, patient history, and epidemiological information. The expected result is Negative.  Fact Sheet for Patients:  EntrepreneurPulse.com.au  Fact Sheet for Healthcare Providers:  IncredibleEmployment.be  This test is no t yet approved or cleared by the [place] FDA and  has been authorized for detection and/or diagnosis of SARS-CoV-2 by FDA under an Emergency Use Authorization (EUA). This EUA will remain  in effect (meaning this test can be used) for the duration of the COVID-19 declaration under Section 564(b)(1) of the Act, 21 U.S.C.section 360bbb-3(b)(1), unless the authorization is terminated  or revoked sooner.       Influenza A by PCR NEGATIVE NEGATIVE Final   Influenza B by PCR NEGATIVE NEGATIVE Final    Comment: (NOTE) The Xpert Xpress SARS-CoV-2/FLU/RSV plus assay is intended as an aid in the diagnosis of influenza from Nasopharyngeal  swab specimens and should not be used as a sole basis for treatment. Nasal washings and aspirates are unacceptable for Xpert Xpress SARS-CoV-2/FLU/RSV testing.  Fact Sheet for Patients: EntrepreneurPulse.com.au  Fact Sheet for Healthcare Providers: IncredibleEmployment.be  This test is not yet approved or cleared by the [place] FDA and has been authorized for detection and/or diagnosis of SARS-CoV-2 by FDA under an Emergency Use Authorization (EUA). This EUA will remain in effect (meaning this test can be used) for the duration of the COVID-19 declaration under Section 564(b)(1) of the Act, 21 U.S.C. section 360bbb-3(b)(1), unless the authorization is terminated or revoked.  Performed at [facility], [address] [place], [place] [address]   Culture, blood (routine x 2)     Status: None (Preliminary result)    Collection Time: 05/17/20  9:37 AM   Specimen: BLOOD LEFT HAND  Result Value Ref Range Status   Specimen Description BLOOD LEFT HAND  Final   Special Requests   Final    BOTTLES DRAWN AEROBIC AND ANAEROBIC Blood Culture results may not be optimal due to an inadequate volume of blood received in culture bottles   Culture   Final    NO GROWTH 2 DAYS Performed at [facility], [address], [place], [place] [address]    Report Status PENDING  Incomplete  Culture, blood (routine x 2)     Status: None (Preliminary result)   Collection Time: 05/17/20  9:38 AM   Specimen: BLOOD RIGHT HAND  Result Value Ref Range Status   Specimen Description BLOOD RIGHT HAND  Final   Special Requests   Final    BOTTLES DRAWN AEROBIC AND ANAEROBIC Blood Culture adequate volume   Culture   Final    NO GROWTH 2 DAYS Performed at [facility], [address] [place],  [address]    Report Status PENDING  Incomplete         Radiology Studies: DG CHEST PORT 1 VIEW  Result Date: 05/18/2020 CLINICAL DATA:  History of TB. EXAM: PORTABLE CHEST 1 VIEW COMPARISON:  Chest x-ray 03/06/2019.  CT 06/30/2019. FINDINGS: Mediastinum hilar structures normal. Linear density previously noted the right upper lung has improved with minimal residual pleuroparenchymal thickening most likely scarring. No focal infiltrate. No pleural effusion or pneumothorax. Heart size normal. Prior cervical spine fusion. Surgical clips right upper quadrant. IMPRESSION: Linear density previously noted in the right upper lung is improved with minimal residual pleuroparenchymal thickening most likely scarring. No focal infiltrate or cavitary lesion. Electronically Signed   By: Marcello Moores  Register   On: 05/18/2020 07:15   VAS [place] ABI WITH/WO TBI  Result Date: 05/18/2020  LOWER EXTREMITY DOPPLER STUDY Patient Name:  Allen Yu  Date of Exam:   05/18/2020 Medical Rec #: [id number]       Accession #:    [id number] Date of Birth: 22-Mar-1945       Patient Gender: M Patient Age:   48Y Exam Location:  [facility] Procedure:      VAS [place] ABI WITH/WO TBI Referring Phys: [place] --------------------------------------------------------------------------------  Indications: Rest pain, ulceration, and Patient is a transfer from [facility],              severe PVD seen on arteriogram. High Risk         Hypertension, Diabetes, current smoker, coronary artery Factors:          disease. Other Factors: Carotid stenosis, CKD, neuropathy, PAD.  Comparison Study: No previous exams Performing  Technologist: Rogelia Rohrer RVT, RDMS  Examination Guidelines: A complete evaluation includes at minimum, Doppler waveform signals and systolic blood pressure reading at the level of bilateral brachial, anterior tibial, and posterior tibial arteries, when vessel segments are accessible. Bilateral testing is considered an integral part of a complete examination. Photoelectric Plethysmograph (PPG) waveforms and toe systolic pressure readings are included as required and additional duplex testing as needed. Limited examinations for reoccurring indications may be performed as noted.  ABI Findings: +---------+------------------+-----+----------+--------+ Right    Rt Pressure (mmHg)IndexWaveform  Comment  +---------+------------------+-----+----------+--------+ Brachial                        biphasic  IV       +---------+------------------+-----+----------+--------+ PTA      122               0.82 biphasic           +---------+------------------+-----+----------+--------+ DP       126               0.85 monophasic         +---------+------------------+-----+----------+--------+ Great Toe15                0.10 Abnormal           +---------+------------------+-----+----------+--------+ +---------+------------------+-----+-------------------+-------+ Left     Lt Pressure (mmHg)IndexWaveform           Comment  +---------+------------------+-----+-------------------+-------+ Brachial 148                    triphasic                  +---------+------------------+-----+-------------------+-------+ PTA      198               1.34 monophasic                 +---------+------------------+-----+-------------------+-------+ DP       188               1.27 dampened monophasic        +---------+------------------+-----+-------------------+-------+ Great Toe0                 0.00 Absent                     +---------+------------------+-----+-------------------+-------+  Summary: Right: Resting right ankle-brachial index indicates mild right lower extremity arterial disease. The right toe-brachial index is essentially zero. Left: Resting left ankle-brachial index indicates noncompressible left lower extremity arteries and could underestimate severity of disease. The left toe-brachial index is absent. ABIs are unreliable.  *See table(s) above for measurements and observations.  Electronically signed by Harold Barban MD on 05/18/2020 at 6:17:01 PM.    Final    VAS US CAROTID  Result Date: 05/18/2020 Carotid Arterial Duplex Study Patient Name:  Allen Yu  Date of Exam:   05/18/2020 Medical Rec #: [id number]       Accession #:    [id number] Date of Birth: [birth year]      Patient Gender: M Patient Age:   56Y Exam Location:  [facility] Procedure:      VAS US CAROTID Referring Phys: [place] --------------------------------------------------------------------------------  Indications:       Carotid artery disease. Risk Factors:      Hypertension, Diabetes. Comparison Study:  07/29/2015 carotid artery duplex- bilateral 1-39% carotid  artery stenosis. Performing Technologist: Darlin Coco RDMS,RVT  Examination Guidelines: A complete evaluation includes B-mode imaging, spectral Doppler, color Doppler, and power Doppler as needed of all accessible portions of each  vessel. Bilateral testing is considered an integral part of a complete examination. Limited examinations for reoccurring indications may be performed as noted.  Right Carotid Findings: +----------+-------+-------+--------+------------------------+-----------------+           PSV    EDV    StenosisPlaque Description      Comments                    cm/s   cm/s                                                     +----------+-------+-------+--------+------------------------+-----------------+ CCA Prox  93     9                                                        +----------+-------+-------+--------+------------------------+-----------------+ CCA Distal85     16                                     intimal                                                                   thickening        +----------+-------+-------+--------+------------------------+-----------------+ ICA Prox  100    27             heterogenous and                                                          irregular                                 +----------+-------+-------+--------+------------------------+-----------------+ ICA Distal81     29                                                       +----------+-------+-------+--------+------------------------+-----------------+ ECA       70                    irregular and  heterogenous                              +----------+-------+-------+--------+------------------------+-----------------+ +----------+--------+-------+----------------+-------------------+           PSV cm/sEDV cmsDescribe        Arm Pressure (mmHG) +----------+--------+-------+----------------+-------------------+ [id number]            Multiphasic, WNL                    +----------+--------+-------+----------------+-------------------+ +---------+--------+--+--------+--+---------+  VertebralPSV cm/s77EDV cm/s18Antegrade +---------+--------+--+--------+--+---------+  Left Carotid Findings: +----------+-------+-------+--------+------------------------+-----------------+           PSV    EDV    StenosisPlaque Description      Comments                    cm/s   cm/s                                                     +----------+-------+-------+--------+------------------------+-----------------+ CCA Prox  160    27                                     intimal                                                                   thickening        +----------+-------+-------+--------+------------------------+-----------------+ CCA Distal81     21                                     intimal                                                                   thickening        +----------+-------+-------+--------+------------------------+-----------------+ ICA Prox  70     16             heterogenous and                                                          irregular                                 +----------+-------+-------+--------+------------------------+-----------------+ ICA Distal111    32                                                       +----------+-------+-------+--------+------------------------+-----------------+  ECA       88     15                                     intimal                                                                   thickening        +----------+-------+-------+--------+------------------------+-----------------+ +----------+--------+--------+----------------+-------------------+           PSV cm/sEDV cm/sDescribe        Arm Pressure (mmHG) +----------+--------+--------+----------------+-------------------+ Subclavian                Multiphasic, WNL                    +----------+--------+--------+----------------+-------------------+  +---------+--------+--+--------+--+---------+ VertebralPSV cm/s75EDV cm/s21Antegrade +---------+--------+--+--------+--+---------+   Summary: Right Carotid: Velocities in the right ICA are consistent with a 1-39% stenosis. Left Carotid: Velocities in the left ICA are consistent with a 1-39% stenosis. Vertebrals:  Bilateral vertebral arteries demonstrate antegrade flow. Subclavians: Normal flow hemodynamics were seen in bilateral subclavian              arteries. *See table(s) above for measurements and observations.  Electronically signed by Harold Barban MD on 05/18/2020 at 22:16:32 PM.    Final         Scheduled Meds: . aspirin EC  81 mg Oral QHS  . atorvastatin  40 mg Oral QHS  . donepezil  10 mg Oral QHS  . gabapentin  200 mg Oral TID  . heparin  5,000 Units Subcutaneous Q8H  . insulin aspart  0-5 Units Subcutaneous QHS  . insulin aspart  0-9 Units Subcutaneous TID WC  . insulin glargine  20 Units Subcutaneous QHS  . losartan  50 mg Oral QHS  . nicotine  14 mg Transdermal Daily   Continuous Infusions: . piperacillin-tazobactam (ZOSYN)  IV 3.375 g (05/19/20 [id number])  . vancomycin 750 mg (05/19/20 1409)     LOS: 2 days    Time spent: 38 minutes spent on chart review, discussion with nursing staff, consultants, updating family and interview/physical exam; more than 50% of that time was spent in counseling and/or coordination of care.    Naliyah Neth J [place] DO [facility] Available via Epic secure chat 7am-7pm After these hours, please refer to coverage provider listed on [email or web address] 05/19/2020, 2:43 PM

## 2020-05-19 NOTE — Progress Notes (Signed)
Initial Nutrition Assessment  DOCUMENTATION CODES:   Severe malnutrition in context of chronic illness,Underweight  INTERVENTION:  Provide Ensure Enlive po TID, each supplement provides 350 kcal and 20 grams of protein.  Encourage adequate PO intake.   NUTRITION DIAGNOSIS:   Severe Malnutrition related to chronic illness (hx of tuberculosis) as evidenced by severe fat depletion,severe muscle depletion.  GOAL:   Patient will meet greater than or equal to 90% of their needs  MONITOR:   PO intake,Supplement acceptance,Skin,Weight trends,Labs,I & O's  REASON FOR ASSESSMENT:   Consult Assessment of nutrition requirement/status,Wound healing  ASSESSMENT:   75 year old male with past medical history of tuberculosis, PTSD, essential hypertension, GERD, type 1 diabetes mellitus, dementia, CKD who presents with complaint of foot pain and ulcer on the heel of his left foot. Left foot x-ray with diffuse severe osteopenia/degenerative change with questionable erosions medial aspect distal left second/third metatarsal and soft tissue wound over heel.  Meal completion has been 75-100%. Pt reports having a good appetite since admission. Family at bedside reports pt with poor po intake at home prior to admission with most meal completion <50%. Per weight records, pt with a 24% weight loss in 10 months, significant for time frame. PerMD, plans for arteriogram tomorrow with possible angioplasty and stenting for L foot. RD to order nutritional supplements to aid in caloric and protein needs as well as in wound healing. Pt encouraged to eat his food at meals.   NUTRITION - FOCUSED PHYSICAL EXAM:  Flowsheet Row Most Recent Value  Orbital Region Unable to assess  Upper Arm Region Severe depletion  Thoracic and Lumbar Region Severe depletion  Buccal Region Unable to assess  Temple Region Unable to assess  Clavicle Bone Region Severe depletion  Clavicle and Acromion Bone Region Severe depletion   Scapular Bone Region Unable to assess  Dorsal Hand Unable to assess  Patellar Region Severe depletion  Anterior Thigh Region Severe depletion  Posterior Calf Region Severe depletion  Edema (RD Assessment) None  Hair Reviewed  Eyes Reviewed  Mouth Reviewed  Skin Reviewed  Nails Reviewed     Labs and medications reviewed.   Diet Order:   Diet Order            Diet NPO time specified Except for: Sips with Meds  Diet effective midnight           Diet heart healthy/carb modified Room service appropriate? Yes; Fluid consistency: Thin  Diet effective now                 EDUCATION NEEDS:   Not appropriate for education at this time  Skin:  Skin Assessment: Skin Integrity Issues: Skin Integrity Issues:: Diabetic Ulcer Diabetic Ulcer: L heel  Last BM:  4/28  Height:   Ht Readings from Last 1 Encounters:  05/17/20 6\' 1"  (1.854 m)    Weight:   Wt Readings from Last 1 Encounters:  05/19/20 55.5 kg    Ideal Body Weight:  83.63 kg  BMI:  Body mass index is 16.14 kg/m.  Estimated Nutritional Needs:   Kcal:  1900-2100  Protein:  90-110 grams  Fluid:  >/= 1.9 L/day  Corrin Parker, MS, RD, LDN RD pager number/after hours weekend pager number on Amion.

## 2020-05-20 ENCOUNTER — Encounter (HOSPITAL_COMMUNITY)
Admission: EM | Disposition: A | Discharge status: Home Health | Payer: Self-pay | Source: Home / Self Care | Attending: Internal Medicine

## 2020-05-20 ENCOUNTER — Encounter (HOSPITAL_COMMUNITY): Payer: Self-pay | Admitting: Vascular Surgery

## 2020-05-20 ENCOUNTER — Encounter (HOSPITAL_COMMUNITY): Payer: Medicare Other

## 2020-05-20 ENCOUNTER — Inpatient Hospital Stay (HOSPITAL_COMMUNITY): Payer: Medicare Other

## 2020-05-20 DIAGNOSIS — I739 Peripheral vascular disease, unspecified: Secondary | ICD-10-CM

## 2020-05-20 DIAGNOSIS — Z01810 Encounter for preprocedural cardiovascular examination: Secondary | ICD-10-CM

## 2020-05-20 HISTORY — PX: ABDOMINAL AORTOGRAM W/LOWER EXTREMITY: CATH118223

## 2020-05-20 LAB — CBC
HCT: 28.5 % — ABNORMAL LOW (ref 39.0–52.0)
Hemoglobin: 9.5 g/dL — ABNORMAL LOW (ref 13.0–17.0)
MCH: 30.6 pg (ref 26.0–34.0)
MCHC: 33.3 g/dL (ref 30.0–36.0)
MCV: 91.9 fL (ref 80.0–100.0)
Platelets: 281 10*3/uL (ref 150–400)
RBC: 3.1 MIL/uL — ABNORMAL LOW (ref 4.22–5.81)
RDW: 13.1 % (ref 11.5–15.5)
WBC: 9.1 10*3/uL (ref 4.0–10.5)
nRBC: 0 % (ref 0.0–0.2)

## 2020-05-20 LAB — GLUCOSE, CAPILLARY
Glucose-Capillary: 71 mg/dL (ref 70–99)
Glucose-Capillary: 92 mg/dL (ref 70–99)
Glucose-Capillary: 52 mg/dL — ABNORMAL LOW (ref 70–99)
Glucose-Capillary: 98 mg/dL (ref 70–99)
Glucose-Capillary: 86 mg/dL (ref 70–99)
Glucose-Capillary: 249 mg/dL — ABNORMAL HIGH (ref 70–99)

## 2020-05-20 LAB — BASIC METABOLIC PANEL
Anion gap: 9 (ref 5–15)
BUN: 10 mg/dL (ref 8–23)
CO2: 26 mmol/L (ref 22–32)
Calcium: 9.3 mg/dL (ref 8.9–10.3)
Chloride: 102 mmol/L (ref 98–111)
Creatinine, Ser: 1.26 mg/dL — ABNORMAL HIGH (ref 0.61–1.24)
GFR, Estimated: 60 mL/min — ABNORMAL LOW (ref >60)
Glucose, Bld: 72 mg/dL (ref 70–99)
Potassium: 4.2 mmol/L (ref 3.5–5.1)
Sodium: 137 mmol/L (ref 135–145)

## 2020-05-20 LAB — MAGNESIUM: Magnesium: 2 mg/dL (ref 1.7–2.4)

## 2020-05-20 SURGERY — ABDOMINAL AORTOGRAM W/LOWER EXTREMITY
Anesthesia: LOCAL

## 2020-05-20 MED ORDER — SODIUM CHLORIDE 0.9 % IV SOLN: 250.0000 mL | INTRAVENOUS | Status: DC | PRN

## 2020-05-20 MED ORDER — SODIUM CHLORIDE 0.9% FLUSH
3.0000 mL | Freq: Two times a day (BID) | INTRAVENOUS | Status: DC
  Administered 2020-05-21: 3 mL via INTRAVENOUS

## 2020-05-20 MED ORDER — LIDOCAINE HCL (PF) 1 % IJ SOLN
INTRAMUSCULAR | Status: DC | PRN
  Administered 2020-05-20: 20 mL

## 2020-05-20 MED ORDER — LABETALOL HCL 5 MG/ML IV SOLN: 10.0000 mg | INTRAVENOUS | Status: DC | PRN

## 2020-05-20 MED ORDER — HEPARIN (PORCINE) IN NACL 1000-0.9 UT/500ML-% IV SOLN
INTRAVENOUS | Status: DC | PRN
  Administered 2020-05-20 (×2): 500 mL

## 2020-05-20 MED ORDER — HEPARIN (PORCINE) IN NACL 1000-0.9 UT/500ML-% IV SOLN
INTRAVENOUS | Status: AC
  Filled 2020-05-20: qty 1000

## 2020-05-20 MED ORDER — ONDANSETRON HCL 4 MG/2ML IJ SOLN: 4.0000 mg | Freq: Four times a day (QID) | INTRAMUSCULAR | Status: DC | PRN

## 2020-05-20 MED ORDER — IODIXANOL 320 MG/ML IV SOLN
INTRAVENOUS | Status: DC | PRN
  Administered 2020-05-20: 179 mL via INTRA_ARTERIAL

## 2020-05-20 MED ORDER — LIDOCAINE HCL (PF) 1 % IJ SOLN
INTRAMUSCULAR | Status: AC
  Filled 2020-05-20: qty 30

## 2020-05-20 MED ORDER — SODIUM CHLORIDE 0.9% FLUSH: 3.0000 mL | INTRAVENOUS | Status: DC | PRN

## 2020-05-20 MED ORDER — SODIUM CHLORIDE 0.9 % IV SOLN: INTRAVENOUS | Status: AC

## 2020-05-20 MED ORDER — ACETAMINOPHEN 325 MG PO TABS: 650.0000 mg | ORAL_TABLET | ORAL | Status: DC | PRN

## 2020-05-20 MED ORDER — HYDRALAZINE HCL 20 MG/ML IJ SOLN: 5.0000 mg | INTRAMUSCULAR | Status: DC | PRN

## 2020-05-20 SURGICAL SUPPLY — 13 items
CATH ANGIO 5F PIGTAIL 65CM (CATHETERS) ×1 IMPLANT
CATH CROSS OVER TEMPO 5F (CATHETERS) ×1 IMPLANT
CATH STRAIGHT 5FR 65CM (CATHETERS) ×1 IMPLANT
GUIDEWIRE ANGLED .035X150CM (WIRE) ×1 IMPLANT
KIT PV (KITS) ×2 IMPLANT
SHEATH PINNACLE 5F 10CM (SHEATH) ×1 IMPLANT
SHEATH PROBE COVER 6X72 (BAG) ×1 IMPLANT
STOPCOCK MORSE 400PSI 3WAY (MISCELLANEOUS) ×1 IMPLANT
SYR MEDRAD MARK 7 150ML (SYRINGE) ×2 IMPLANT
TRANSDUCER W/STOPCOCK (MISCELLANEOUS) ×2 IMPLANT
TRAY PV CATH (CUSTOM PROCEDURE TRAY) ×2 IMPLANT
TUBING CIL FLEX 10 FLL-RA (TUBING) ×1 IMPLANT
WIRE HITORQ VERSACORE ST 145CM (WIRE) ×1 IMPLANT

## 2020-05-20 NOTE — Progress Notes (Signed)
PT Cancellation Note  Patient Details Name: Allen Yu MRN: [id number] DOB: 1945/09/19   Cancelled Treatment:    Reason Eval/Treat Not Completed: Patient declined, no reason specified. PT attempts to see pt for mobility however pt refusing. Pt reports he was told by 3 doctors that he will never walk again. 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 PT provides education that walking is definitely possible with neuropathy, however pt continues to refuse. PT will follows up as time allows.   Zenaida Niece 05/20/2020, 10:24 AM

## 2020-05-20 NOTE — Care Management Important Message (Signed)
Important Message  Patient Details  Name: Allen Yu MRN: [id number] Date of Birth: [birth year]   Medicare Important Message Given:  Yes     Shelda Altes 05/20/2020, 9:51 AM

## 2020-05-20 NOTE — Progress Notes (Signed)
84fr sheath aspirated and removed from right femoral artery. Manual pressure applied for 20 minutes. Site level 0, no s+s of hematoma. Tegaderm dressing applied, bedrest instructions given. Patient reminded not to raise his head off the pillow.   Bilateral dp and pt pulses weakly present with doppler.   CBG 52, 1 cup of apple juice given.   Bedrest begins at 13:00:00

## 2020-05-20 NOTE — Progress Notes (Signed)
Lower extremity venous saphenous mapping study completed.   Please see CV Proc for preliminary results.   Darlin Coco, RDMS, RVT

## 2020-05-20 NOTE — Interval H&P Note (Signed)
History and Physical Interval Note:  05/20/2020 11:10 AM  Allen Yu  has presented today for surgery, with the diagnosis of pvd with left heel ulcer.  The various methods of treatment have been discussed with the patient and family. After consideration of risks, benefits and other options for treatment, the patient has consented to  Procedure(s): ABDOMINAL AORTOGRAM W/LOWER EXTREMITY (N/A) as a surgical intervention.  The patient's history has been reviewed, patient examined, no change in status, stable for surgery.  I have reviewed the patient's chart and labs.  Questions were answered to the patient's satisfaction.     Ruta Hinds

## 2020-05-20 NOTE — Progress Notes (Signed)
Occupational Therapy Treatment Patient Details Name: Allen Yu MRN: [id number] DOB: [birth year] Today's Date: 05/20/2020    History of present illness 75 y.o. male presents to [facility] ED on 05/17/2020 with L foot pain. Pt admitted for diabetic foot infection and hyperglycemia. CTA results demonstrate significant vascular disease. PMH includes tuberculosis, PTSD, hypertension, GERD, diabetes mellitus type 1, dementia, CKD.   OT comments  Pt continues to present with decreased cognition and occupational performance. Pt agreeable to grooming at bed level; pt sitting himself up into long sitting and completing oral care and washing his face. Pt very HOH and requiring increased cues throughout. Pt becoming upset with any mention of OOB or mobility. Continues to recommend dc to home with family. However, pending pt's progress with OOB mobility, may need post acute rehab for optimize safety and balance with ADLs.   Follow Up Recommendations  No OT follow up;SNF (Limited session due to pt's refusal for OOB activity. Pending support, pt may need SNF for cognition, safety, and balance in preparation for home.)    Equipment Recommendations  None recommended by OT    Recommendations for Other Services      Precautions / Restrictions Precautions Precautions: Fall Restrictions Weight Bearing Restrictions: No       Mobility Bed Mobility Overal bed mobility: Needs Assistance             General bed mobility comments: Pt able to pull himself up into long sitting.    Transfers                 General transfer comment: Declining OOB actiivty    Balance Overall balance assessment: Needs assistance Sitting-balance support: No upper extremity supported;Feet supported Sitting balance-Leahy Scale: Good                                     ADL either performed or assessed with clinical judgement   ADL Overall ADL's : Needs assistance/impaired     Grooming: Oral  care;Set up;Bed level Grooming Details (indicate cue type and reason): Pt performing oral care and washing his face while long sitting in bed. Cues for rinsing and spitting.             Lower Body Dressing: Maximal assistance Lower Body Dressing Details (indicate cue type and reason): Max A to don sock, pt with decreased participation in task               General ADL Comments: Pt not agreeable to OOB today. Whenever topic mention, pt becoming angry and stating "I told you I am not going to walk. The doctor said I will never walk again." Pt very agreeable to grooming at bed level. Pt pulling into long sitting and performing oral care     Vision       Perception     Praxis      Cognition Arousal/Alertness: Awake/alert Behavior During Therapy: WFL for tasks assessed/performed Overall Cognitive Status: Impaired/Different from baseline Area of Impairment: Safety/judgement;Awareness;Memory                     Memory: Decreased short-term memory   Safety/Judgement: Decreased awareness of safety;Decreased awareness of deficits Awareness: Emergent;Intellectual   General Comments: Pt presenting with increased "agitation" this session. Pt oriented to place and that he is having surgery later today. However, pt reporting "they may take both feet off" and "I spoke to two  doctors who said ill never walk again." Pt agreeable to grooming at bed level but becoming frustrated with any mention of OOB activity. Pt stating "I told you I am never walking again!"        Exercises     Shoulder Instructions       General Comments VSS on RA    Pertinent Vitals/ Pain       Faces Pain Scale: Hurts a little bit Pain Location: L foot Pain Descriptors / Indicators: Grimacing  Home Living                                          Prior Functioning/Environment              Frequency  Min 2X/week        Progress Toward Goals  OT Goals(current goals  can now be found in the care plan section)  Progress towards OT goals: Not progressing toward goals - comment (Decreased participation)  Acute Rehab OT Goals Patient Stated Goal: to go home OT Goal Formulation: With patient Time For Goal Achievement: 06/01/20 Potential to Achieve Goals: Good ADL Goals Pt Will Perform Grooming: sitting;standing;with modified independence Pt Will Perform Lower Body Bathing: sit to/from stand;with modified independence Pt Will Perform Lower Body Dressing: with modified independence;sit to/from stand Pt Will Transfer to Toilet: with modified independence;regular height toilet;ambulating Pt Will Perform Toileting - Clothing Manipulation and hygiene: with modified independence;sit to/from stand  Plan Discharge plan needs to be updated    Co-evaluation                 AM-PAC OT "6 Clicks" Daily Activity     Outcome Measure   Help from another person eating meals?: None Help from another person taking care of personal grooming?: None Help from another person toileting, which includes using toliet, bedpan, or urinal?: A Little Help from another person bathing (including washing, rinsing, drying)?: A Little Help from another person to put on and taking off regular upper body clothing?: None Help from another person to put on and taking off regular lower body clothing?: A Little 6 Click Score: 21    End of Session    OT Visit Diagnosis: Unsteadiness on feet (R26.81);Pain Pain - Right/Left: Left Pain - part of body: Ankle and joints of foot   Activity Tolerance Patient tolerated treatment well   Patient Left in bed;with call bell/phone within reach;with bed alarm set   Nurse Communication Mobility status        Time: 9191-6606 OT Time Calculation (min): 18 min  Charges: OT General Charges $OT Visit: 1 Visit OT Treatments $Self Care/Home Management : 8-22 mins  [place], OTR/L Acute Rehab Pager: [phone] Office:  [place] 05/20/2020, 10:10 AM

## 2020-05-20 NOTE — Op Note (Addendum)
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  The patient was placed in supine position on the angio table.  Both groins were prepped and draped in usual sterile fashion.  Local anesthesia was infiltrated over the right common femoral artery femoral to right common femoral artery and a 3 5 versa core wire threaded up the abdominal aorta under fluoroscopic guidance.  Next a 5 French sheath was placed over the guidewire in the right common femoral artery.  This was thoroughly flushed with saline.  5 French pigtail catheter was advanced over the guidewire in the abdominal aorta.  Abdominal aortogram was obtained in AP projection.  Infrarenal abdominal aorta is patent.  This is heavily calcified.  The left and right renal arteries are patent.  The left and right common iliac arteries are patent but again heavily calcified.  There is a 90% stenosis of the right internal iliac artery origin.  The left and right internal iliac arteries are patent.  Bilateral oblique views of the pelvis were obtained due to overlying orthopedic hardware.  This confirmed the above findings with the external iliac artery patent bilaterally.  Next bilateral lower extremity runoff views were obtained through the pigtail catheter.  In the left lower extremity the left common femoral artery is patent.  There is a tapered narrowing of the femoral bifurcation with a widely patent profunda but subtotal occlusion  of the origin of the left superficial femoral artery.  The left superficial femoral artery then occludes at the adductor hiatus over several centimeters.  The popliteal artery reconstitutes but is diffusely diseased all the way down into the below-knee popliteal artery.  There is one-vessel peroneal runoff.  There are similar findings in the right leg although the superficial femoral artery in the right leg is slightly less diseased.  At this point to get better opacification of the tibial vessels the pigtail catheter was removed over guidewire and swapped out for a 5 [place] crossover catheter.  I then advanced an 035 angled Glidewire down in the left common femoral artery.  Crossover catheter was removed and swapped out for a 5 French straight catheter.  Left lower extremity arteriogram was obtained.  This showed again a diffusely diseased below-knee popliteal artery with a short segment occlusion of the origin of the tibioperoneal trunk.  There is one-vessel peroneal runoff to the left foot.  The vessels in the left foot are diffusely diseased.    At this point a 5 [place] straight catheter was removed over guidewire.  The 5 French sheath was left in place to be pulled in the holding area.  Operative management: Patient will be scheduled for a left femoral to peroneal bypass by Dr. Donnetta Hutching next Friday, May 27, 2020.  We will schedule the patient for a vein mapping ultrasound today to see if he has adequate conduit for this bypass.  If he had very small saphenous vein bilaterally that was inadequate for use as a bypass graft consideration could be given for attempted recanalization of his left superficial femoral artery and angioplasty of his peroneal  artery.  However this would be fairly high risk of distal embolization and I would only reserve this if he has an adequate bypass conduit for peroneal bypass.  The patient would be okay to go home today after his bedrest time and after he gets his vein  mapping.  Ruta Hinds, MD [facility] Office: [phone]

## 2020-05-20 NOTE — Progress Notes (Signed)
Pharmacy Antibiotic Note  Allen Yu is a 75 y.o. male admitted on 05/17/2020 with cellulitis.  Pharmacy has been consulted for Vancomycin dosing.  Scr stable at 1.26 (CrCl ~40 mL/min). On day #4 of abx. WBC 9.1, afebrile. No growth on cx. Plan for arteriogram with possible angioplasty/stenting today.   Plan: Vancomycin 750 mg IV every 24 hours - talked with Dr [place] okay to stop antibiotics after tomorrow's dose for total of 5 days Monitor labs, c/s, and vanco level as indicated.   Height: 6\' 1"  (185.4 cm) Weight: 55.7 kg (122 lb 12.7 oz) IBW/kg (Calculated) : 79.9  Temp (24hrs), Avg:98.5 F (36.9 C), Min:97.7 F (36.5 C), Max:99.5 F (37.5 C)  Recent Labs  Lab 05/17/20 0842 05/18/20 0845 05/19/20 0236 05/20/20 0548  WBC 6.7 9.8 8.6 9.1  CREATININE 1.26* 1.19 1.31* 1.26*  LATICACIDVEN 0.8  --   --   --     Estimated Creatinine Clearance: 40.5 mL/min (A) (by C-G formula based on SCr of 1.26 mg/dL (H)).    Allergies  Allergen Reactions  . Bee Venom Anaphylaxis  . Codeine Other (See Comments)    incoherent   . Diovan [Valsartan] Other (See Comments)    incoherent  . Propoxyphene Other (See Comments)    Dizziness, "Makes me feel drunk"    Antimicrobials this admission: Vanco 4/26 >>  Zosyn 4/26  Microbiology results: 4/26 BCx: ngtd   Thank you for allowing pharmacy to be a part of this patient's care.  Antonietta Jewel, PharmD, [place] Clinical Pharmacist  Phone: [phone] 05/20/2020 10:43 AM  Please check AMION for all [place] phone numbers After 10:00 PM, call [place] [phone]

## 2020-05-20 NOTE — Plan of Care (Signed)
  Problem: Activity: Goal: Risk for activity intolerance will decrease Outcome: Progressing   Problem: Nutrition: Goal: Adequate nutrition will be maintained Outcome: Progressing   Problem: Pain Managment: Goal: General experience of comfort will improve Outcome: Progressing   Problem: Safety: Goal: Ability to remain free from injury will improve Outcome: Progressing   Problem: Skin Integrity: Goal: Risk for impaired skin integrity will decrease Outcome: Progressing   Problem: Education: Goal: Understanding of CV disease, CV risk reduction, and recovery process will improve Outcome: Progressing Goal: Individualized Educational Video(s) Outcome: Progressing   Problem: Activity: Goal: Ability to return to baseline activity level will improve Outcome: Progressing   Problem: Cardiovascular: Goal: Ability to achieve and maintain adequate cardiovascular perfusion will improve Outcome: Progressing Goal: Vascular access site(s) Level 0-1 will be maintained Outcome: Progressing   Problem: Health Behavior/Discharge Planning: Goal: Ability to safely manage health-related needs after discharge will improve Outcome: Progressing   

## 2020-05-20 NOTE — Progress Notes (Signed)
PROGRESS NOTE    Allen Yu  LKG:[id number] DOB: [birth year] DOA: 05/17/2020 PCP: [name], MD    Brief Narrative:  Allen Yu is a 75 year old male with past medical history of tuberculosis, PTSD, essential hypertension, GERD, type 1 diabetes mellitus, dementia, CKD who presents to the ED with complaint of foot pain.  Patient has reported 4 months of persistent foot pain and ulcer on the heel of his left foot.  He has been to his PCP and podiatrist to told him "there is nothing to do for it".  He reports has taken antibiotics for it.  Patient has attempted to treat with creams, heel balls, and dressings.  He reports occasional bloody discharge and malodorous discharge.  Patient denies fevers, no chest pain, no palpitations, no shortness of breath, no abdominal pain.  In the ED, temperature 97.5 F, HR 78, RR 15, BP 137/76, SPO2 100% on room air.  Sodium 134, potassium 3.5, CO2 26, chloride 99, BUN 15, creatinine 1.26, glucose 330.  Lactic acid 0.8.  WBC 6.7, hemoglobin 9.4, platelets 260.  SARS-CoV-2 PCR negative.  Influenza A/B PCR negative.  Left foot x-ray with diffuse severe osteopenia and degenerative change, questional erosion medial aspect left distal second and third metatarsals, soft tissue wound over the heel with no adjacent bony erosion and no radiopaque foreign body.  CT angiogram aortobifemoral with embolic contrast with extensive atherosclerotic plaque without high-grade occlusion or stenosis and bilateral internal iliac artery high-grade stenosis, left/right SFA stenosis.  Vascular surgery was consulted, hospital service consulted for further evaluation and management of diabetic foot infection.   Assessment & Plan:   Active Problems:   Hypertension   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HCC)   Tobacco use disorder   Diabetic foot infection (HCC)   Protein-calorie malnutrition, severe   Diabetic foot infection Left lower extremity critical limb  ischemia Patient presenting to the ED with left foot pain with associated heel ulcer that been progressing for the last 4 months.  Patient is afebrile without leukocytosis.  Left foot x-ray with diffuse severe osteopenia/degenerative change with questionable erosions medial aspect distal left second/third metatarsal and soft tissue wound over heel. ABI with mild RLE arterial disease and LLE with non-compressible arteries w/ absent toe brachial index.  --Vascular surgery following, appreciate assistance --Vancomycin/Zosyn; pharmacy consulted for dosing/monitoring --Continue aspirin 81 mg p.o. daily and atorvastatin 40 mg p.o. daily --Plan abdominal aortogram today  Type 2 diabetes mellitus, with hyperglycemia Hemoglobin A1c 9.5 on 05/18/2020, poorly controlled.  Home regimen includes 60 units qPM, Humalog 5-10 units TID prn. --continue to hold lantus --SSI for further coverage --CBG qAC/HS  Essential hypertension --Losartan 50 mg p.o. nightly  History of tuberculosis Patient reports has been treated twice for tuberculosis infection.  Patient follows with infectious disease, Dr. Megan Salon outpatient.  Last seen in clinic December 2021 in which medications were discontinued due to nausea/vomiting and weight loss.  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  Patient is asymptomatic, chest x-ray with right upper lobe linear density improved likely scarring and no infiltrate or cavitary lesion.  Discussed with ID, Dr. Gale Journey on 4/27; no further treatment indicated at this time; and can remove airborne isolation precautions. --Outpatient follow-up with ID  CKD stage IIIa Baseline creatinine 1.2-1.3. Cr 1.26 today. -- Avoid nephrotoxins, renally dose all medications  Severe protein calorie malnutrition Body mass index is 16.2 kg/m.  Evidenced by cachexia and muscle wasting. Nutrition  Status: Nutrition Problem: Severe  Malnutrition Etiology: chronic illness (hx of tuberculosis) Signs/Symptoms: severe fat depletion,severe muscle depletion Interventions: Ensure Enlive (each supplement provides 350kcal and 20 grams of protein) --nutrition following  Dementia --Donezepil 10 mg p.o. nightly  Tobacco use disorder Counseled on need for complete cessation given diabetic wound.  Declines nicotine patch.   DVT prophylaxis: Heparin   Code Status: Full Code Family Communication: No family present at bedside this morning  Disposition Plan:  Level of care: Med-Surg Status is: Inpatient  Remains inpatient appropriate because:Ongoing diagnostic testing needed not appropriate for outpatient work up, Unsafe d/c plan, IV treatments appropriate due to intensity of illness or inability to take PO and Inpatient level of care appropriate due to severity of illness   Dispo: The patient is from: Home              Anticipated d/c is to: Home with [facility] (refuses SNF)              Patient currently is not medically stable to d/c.   Difficult to place patient No   Consultants:   Vascular surgery  Procedures:   ABIs bilateral lower extremity  Carotid duplex ultrasound  Abdominal aortogram: Pending today  Antimicrobials:   Vancomycin 4/26>>  Zosyn 4/26>>   Subjective: Patient seen examined bedside, resting comfortably.  Complains of pain to left heel.  Plan for abdominal aortogram later today.  Continues on IV antibiotics.  No other questions or concerns at this time.  Denies headache, no chest pain, palpitations, shortness of breath, no abdominal pain, no fever/chills/night sweats.  No acute concerns overnight per nursing staff.  Refuses SNF placement with plans home health when ready for discharge.  Objective: Vitals:   05/20/20 0458 05/20/20 0700 05/20/20 0740 05/20/20 1140  BP: 135/79 (!) 143/80    Pulse: 78 80 95   Resp: 15 15    Temp: 98.6 F (37 C) 98 F (36.7 C)    TempSrc: Oral     SpO2: 100%  100% 99% 100%  Weight: 55.7 kg     Height:        Intake/Output Summary (Last 24 hours) at 05/20/2020 1158 Last data filed at 05/19/2020 2022 Gross per 24 hour  Intake 460 ml  Output 850 ml  Net -390 ml   Filed Weights   05/18/20 0636 05/19/20 0508 05/20/20 0458  Weight: 58 kg 55.5 kg 55.7 kg    Examination:  General exam: Appears calm and comfortable, thin/cachectic in appearance, appears older than stated age Respiratory system: Clear to auscultation. Respiratory effort normal.  On room air Cardiovascular system: S1 & S2 heard, RRR. No JVD, murmurs, rubs, gallops or clicks. No pedal edema. Gastrointestinal system: Abdomen is nondistended, soft and nontender. No organomegaly or masses felt. Normal bowel sounds heard. Central nervous system: Alert. No focal neurological deficits. Extremities: Symmetric 5 x 5 power. Skin: Necrotic ulcer left heel, malodorous drainage, no surrounding erythema; otherwise no other ulcerations noted. Psychiatry: Judgement and insight appear poor. Mood & affect appropriate.         Data Reviewed: I have personally reviewed following labs and imaging studies  CBC: Recent Labs  Lab 05/17/20 0842 05/18/20 0845 05/19/20 0236 05/20/20 0548  WBC 6.7 9.8 8.6 9.1  NEUTROABS 5.1  --   --   --   HGB 9.4* 10.2* 8.7* 9.5*  HCT 27.4* 30.0* 25.5* 28.5*  MCV 91.9 90.9 90.1 91.9  PLT 260 324 277 [id number]   Basic Metabolic Panel: Recent  Labs  Lab 05/17/20 0842 05/18/20 0845 05/19/20 0236 05/19/20 1626 05/20/20 0548  NA 134* 140 137  --  137  K 3.5 3.3* 2.9* 4.5 4.2  CL 99 103 102  --  102  CO2 26 28 26   --  26  GLUCOSE 330* 54* 65*  --  72  BUN 15 12 17   --  10  CREATININE 1.26* 1.19 1.31*  --  1.26*  CALCIUM 9.1 9.4 8.9  --  9.3  MG  --  2.0 1.9 1.9 2.0   GFR: Estimated Creatinine Clearance: 40.5 mL/min (A) (by C-G formula based on SCr of 1.26 mg/dL (H)). Liver Function Tests: Recent Labs  Lab 05/18/20 0845 05/19/20 0236  AST 19 16  ALT  11 10  ALKPHOS 115 99  BILITOT 1.3* 0.8  PROT 6.7 6.0*  ALBUMIN 3.5 3.0*   No results for input(s): LIPASE, AMYLASE in the last 168 hours. No results for input(s): AMMONIA in the last 168 hours. Coagulation Profile: No results for input(s): INR, PROTIME in the last 168 hours. Cardiac Enzymes: No results for input(s): CKTOTAL, CKMB, CKMBINDEX, TROPONINI in the last 168 hours. BNP (last 3 results) No results for input(s): PROBNP in the last 8760 hours. HbA1C: Recent Labs    05/18/20 0845 05/19/20 0236  HGBA1C 9.5* 9.2*   CBG: Recent Labs  Lab 05/19/20 1152 05/19/20 1615 05/19/20 2200 05/20/20 0749 05/20/20 0915  GLUCAP 215* 130* 89 71 92   Lipid Profile: Recent Labs    05/19/20 0236  CHOL 118  HDL 40*  LDLCALC 68  TRIG 52  CHOLHDL 3.0   Thyroid Function Tests: No results for input(s): TSH, T4TOTAL, FREET4, T3FREE, THYROIDAB in the last 72 hours. Anemia Panel: No results for input(s): VITAMINB12, FOLATE, FERRITIN, TIBC, IRON, RETICCTPCT in the last 72 hours. Sepsis Labs: Recent Labs  Lab 05/17/20 0842  LATICACIDVEN 0.8    Recent Results (from the past 240 hour(s))  Resp Panel by RT-PCR (Flu A&B, Covid) Nasopharyngeal Swab     Status: None   Collection Time: 05/17/20  9:10 AM   Specimen: Nasopharyngeal Swab; Nasopharyngeal(NP) swabs in vial transport medium  Result Value Ref Range Status   SARS Coronavirus 2 by RT PCR NEGATIVE NEGATIVE Final    Comment: (NOTE) SARS-CoV-2 target nucleic acids are NOT DETECTED.  The SARS-CoV-2 RNA is generally detectable in upper respiratory specimens during the acute phase of infection. The lowest concentration of SARS-CoV-2 viral copies this assay can detect is 138 copies/mL. A negative result does not preclude SARS-Cov-2 infection and should not be used as the sole basis for treatment or other patient management decisions. A negative result may occur with  improper specimen collection/handling, submission of specimen  other than nasopharyngeal swab, presence of viral mutation(s) within the areas targeted by this assay, and inadequate number of viral copies(<138 copies/mL). A negative result must be combined with clinical observations, patient history, and epidemiological information. The expected result is Negative.  Fact Sheet for Patients:  EntrepreneurPulse.com.au  Fact Sheet for Healthcare Providers:  IncredibleEmployment.be  This test is no t yet approved or cleared by the [place] FDA and  has been authorized for detection and/or diagnosis of SARS-CoV-2 by FDA under an Emergency Use Authorization (EUA). This EUA will remain  in effect (meaning this test can be used) for the duration of the COVID-19 declaration under Section 564(b)(1) of the Act, 21 U.S.C.section 360bbb-3(b)(1), unless the authorization is terminated  or revoked sooner.  Influenza A by PCR NEGATIVE NEGATIVE Final   Influenza B by PCR NEGATIVE NEGATIVE Final    Comment: (NOTE) The Xpert Xpress SARS-CoV-2/FLU/RSV plus assay is intended as an aid in the diagnosis of influenza from Nasopharyngeal swab specimens and should not be used as a sole basis for treatment. Nasal washings and aspirates are unacceptable for Xpert Xpress SARS-CoV-2/FLU/RSV testing.  Fact Sheet for Patients: EntrepreneurPulse.com.au  Fact Sheet for Healthcare Providers: IncredibleEmployment.be  This test is not yet approved or cleared by the [place] FDA and has been authorized for detection and/or diagnosis of SARS-CoV-2 by FDA under an Emergency Use Authorization (EUA). This EUA will remain in effect (meaning this test can be used) for the duration of the COVID-19 declaration under Section 564(b)(1) of the Act, 21 U.S.C. section 360bbb-3(b)(1), unless the authorization is terminated or revoked.  Performed at [facility], [address], [place], [place]  [address]   Culture, blood (routine x 2)     Status: None (Preliminary result)   Collection Time: 05/17/20  9:37 AM   Specimen: BLOOD LEFT HAND  Result Value Ref Range Status   Specimen Description BLOOD LEFT HAND  Final   Special Requests   Final    BOTTLES DRAWN AEROBIC AND ANAEROBIC Blood Culture results may not be optimal due to an inadequate volume of blood received in culture bottles   Culture   Final    NO GROWTH 3 DAYS Performed at [facility], [address], [place], [place] [address]    Report Status PENDING  Incomplete  Culture, blood (routine x 2)     Status: None (Preliminary result)   Collection Time: 05/17/20  9:38 AM   Specimen: BLOOD RIGHT HAND  Result Value Ref Range Status   Specimen Description BLOOD RIGHT HAND  Final   Special Requests   Final    BOTTLES DRAWN AEROBIC AND ANAEROBIC Blood Culture adequate volume   Culture   Final    NO GROWTH 3 DAYS Performed at [facility], [address], [place], [place] [address]    Report Status PENDING  Incomplete         Radiology Studies: VAS [place] ABI WITH/WO TBI  Result Date: 05/18/2020  [place] STUDY Patient Name:  VOLTAIRE LAWRANCE  Date of Exam:   05/18/2020 Medical Rec #: [id number]       Accession #:    [id number] Date of Birth: [birth year]      Patient Gender: M Patient Age:   75Y Exam Location:  [facility] Procedure:      VAS [place] ABI WITH/WO TBI Referring Phys: [place] --------------------------------------------------------------------------------  Indications: Rest pain, ulceration, and Patient is a transfer from [facility],              severe PVD seen on arteriogram. High Risk         Hypertension, Diabetes, current smoker, coronary artery Factors:          disease. Other Factors: Carotid stenosis, CKD, neuropathy, PAD.  Comparison Study: No previous exams Performing Technologist: Hill, Jody RVT, RDMS  Examination Guidelines: A complete evaluation includes at minimum, Doppler  waveform signals and systolic blood pressure reading at the level of bilateral brachial, anterior tibial, and posterior tibial arteries, when vessel segments are accessible. Bilateral testing is considered an integral part of a complete examination. Photoelectric Plethysmograph (PPG) waveforms and toe systolic pressure readings are included as required and additional duplex testing as needed. Limited examinations for reoccurring  indications may be performed as noted.  ABI Findings: +---------+------------------+-----+----------+--------+ Right    Rt Pressure (mmHg)IndexWaveform  Comment  +---------+------------------+-----+----------+--------+ Brachial                        biphasic  IV       +---------+------------------+-----+----------+--------+ PTA      122               0.82 biphasic           +---------+------------------+-----+----------+--------+ DP       126               0.85 monophasic         +---------+------------------+-----+----------+--------+ Great Toe15                0.10 Abnormal           +---------+------------------+-----+----------+--------+ +---------+------------------+-----+-------------------+-------+ Left     Lt Pressure (mmHg)IndexWaveform           Comment +---------+------------------+-----+-------------------+-------+ Brachial 148                    triphasic                  +---------+------------------+-----+-------------------+-------+ PTA      198               1.34 monophasic                 +---------+------------------+-----+-------------------+-------+ DP       188               1.27 dampened monophasic        +---------+------------------+-----+-------------------+-------+ Great Toe0                 0.00 Absent                     +---------+------------------+-----+-------------------+-------+  Summary: Right: Resting right ankle-brachial index indicates mild right lower extremity arterial disease. The right  toe-brachial index is essentially zero. Left: Resting left ankle-brachial index indicates noncompressible left lower extremity arteries and could underestimate severity of disease. The left toe-brachial index is absent. ABIs are unreliable.  *See table(s) above for measurements and observations.  Electronically signed by Harold Barban MD on 05/18/2020 at 6:17:01 PM.    Final    VAS US CAROTID  Result Date: 05/18/2020 Carotid Arterial Duplex Study Patient Name:  CHEVON UNTHANK  Date of Exam:   05/18/2020 Medical Rec #: [id number]       Accession #:    [id number] Date of Birth: October 22, 1945      Patient Gender: M Patient Age:   33Y Exam Location:  [facility] Procedure:      VAS US CAROTID Referring Phys: [place] --------------------------------------------------------------------------------  Indications:       Carotid artery disease. Risk Factors:      Hypertension, Diabetes. Comparison Study:  07/29/2015 carotid artery duplex- bilateral 1-39% carotid                    artery stenosis. Performing Technologist: Darlin Coco RDMS,RVT  Examination Guidelines: A complete evaluation includes B-mode imaging, spectral Doppler, color Doppler, and power Doppler as needed of all accessible portions of each vessel. Bilateral testing is considered an integral part of a complete examination. Limited examinations for reoccurring indications may be performed as noted.  Right Carotid Findings: +----------+-------+-------+--------+------------------------+-----------------+           PSV  EDV    StenosisPlaque Description      Comments                    cm/s   cm/s                                                     +----------+-------+-------+--------+------------------------+-----------------+ CCA Prox  93     9                                                        +----------+-------+-------+--------+------------------------+-----------------+ CCA Distal85     16                                      intimal                                                                   thickening        +----------+-------+-------+--------+------------------------+-----------------+ ICA Prox  100    27             heterogenous and                                                          irregular                                 +----------+-------+-------+--------+------------------------+-----------------+ ICA Distal81     29                                                       +----------+-------+-------+--------+------------------------+-----------------+ ECA       70                    irregular and                                                             heterogenous                              +----------+-------+-------+--------+------------------------+-----------------+ +----------+--------+-------+----------------+-------------------+           PSV cm/sEDV cmsDescribe        Arm Pressure (mmHG) +----------+--------+-------+----------------+-------------------+ [id number]  Multiphasic, WNL                    +----------+--------+-------+----------------+-------------------+ +---------+--------+--+--------+--+---------+ VertebralPSV cm/s77EDV cm/s18Antegrade +---------+--------+--+--------+--+---------+  Left Carotid Findings: +----------+-------+-------+--------+------------------------+-----------------+           PSV    EDV    StenosisPlaque Description      Comments                    cm/s   cm/s                                                     +----------+-------+-------+--------+------------------------+-----------------+ CCA Prox  160    27                                     intimal                                                                   thickening        +----------+-------+-------+--------+------------------------+-----------------+ CCA Distal81     21                                      intimal                                                                   thickening        +----------+-------+-------+--------+------------------------+-----------------+ ICA Prox  70     16             heterogenous and                                                          irregular                                 +----------+-------+-------+--------+------------------------+-----------------+ ICA Distal111    32                                                       +----------+-------+-------+--------+------------------------+-----------------+ ECA       88     15                                     intimal  thickening        +----------+-------+-------+--------+------------------------+-----------------+ +----------+--------+--------+----------------+-------------------+           PSV cm/sEDV cm/sDescribe        Arm Pressure (mmHG) +----------+--------+--------+----------------+-------------------+ Subclavian                Multiphasic, WNL                    +----------+--------+--------+----------------+-------------------+ +---------+--------+--+--------+--+---------+ VertebralPSV cm/s75EDV cm/s21Antegrade +---------+--------+--+--------+--+---------+   Summary: Right Carotid: Velocities in the right ICA are consistent with a 1-39% stenosis. Left Carotid: Velocities in the left ICA are consistent with a 1-39% stenosis. Vertebrals:  Bilateral vertebral arteries demonstrate antegrade flow. Subclavians: Normal flow hemodynamics were seen in bilateral subclavian              arteries. *See table(s) above for measurements and observations.  Electronically signed by Harold Barban MD on 05/18/2020 at 73:16:32 PM.    Final         Scheduled Meds: . [MAR Hold] aspirin EC  81 mg Oral QHS  . [MAR Hold] atorvastatin  40 mg Oral QHS  . [MAR Hold] donepezil   10 mg Oral QHS  . [MAR Hold] feeding supplement  237 mL Oral TID BM  . [MAR Hold] gabapentin  200 mg Oral TID  . [MAR Hold] heparin  5,000 Units Subcutaneous Q8H  . [MAR Hold] insulin aspart  0-5 Units Subcutaneous QHS  . [MAR Hold] insulin aspart  0-9 Units Subcutaneous TID WC  . [MAR Hold] losartan  50 mg Oral QHS  . [MAR Hold] nicotine  14 mg Transdermal Daily   Continuous Infusions: . [MAR Hold] piperacillin-tazobactam (ZOSYN)  IV 3.375 g (05/20/20 0531)  . [MAR Hold] vancomycin 750 mg (05/19/20 1409)     LOS: 3 days    Time spent: 36 minutes spent on chart review, discussion with nursing staff, consultants, updating family and interview/physical exam; more than 50% of that time was spent in counseling and/or coordination of care.    Graelyn Bihl J [place] DO [facility] Available via Epic secure chat 7am-7pm After these hours, please refer to coverage provider listed on [email or web address] 05/20/2020, 11:58 AM

## 2020-05-21 DIAGNOSIS — I70229 Atherosclerosis of native arteries of extremities with rest pain, unspecified extremity: Secondary | ICD-10-CM | POA: Diagnosis present

## 2020-05-21 LAB — CBC
HCT: 24.9 % — ABNORMAL LOW (ref 39.0–52.0)
Hemoglobin: 8.5 g/dL — ABNORMAL LOW (ref 13.0–17.0)
MCH: 31.3 pg (ref 26.0–34.0)
MCHC: 34.1 g/dL (ref 30.0–36.0)
MCV: 91.5 fL (ref 80.0–100.0)
Platelets: 285 10*3/uL (ref 150–400)
RBC: 2.72 MIL/uL — ABNORMAL LOW (ref 4.22–5.81)
RDW: 13.1 % (ref 11.5–15.5)
WBC: 7.7 10*3/uL (ref 4.0–10.5)
nRBC: 0 % (ref 0.0–0.2)

## 2020-05-21 LAB — BASIC METABOLIC PANEL
Anion gap: 11 (ref 5–15)
BUN: 11 mg/dL (ref 8–23)
CO2: 25 mmol/L (ref 22–32)
Calcium: 8.9 mg/dL (ref 8.9–10.3)
Chloride: 100 mmol/L (ref 98–111)
Creatinine, Ser: 1.28 mg/dL — ABNORMAL HIGH (ref 0.61–1.24)
GFR, Estimated: 59 mL/min — ABNORMAL LOW (ref >60)
Glucose, Bld: 101 mg/dL — ABNORMAL HIGH (ref 70–99)
Potassium: 4 mmol/L (ref 3.5–5.1)
Sodium: 136 mmol/L (ref 135–145)

## 2020-05-21 LAB — QUANTIFERON-TB GOLD PLUS: QuantiFERON-TB Gold Plus: POSITIVE — AB

## 2020-05-21 LAB — QUANTIFERON-TB GOLD PLUS (RQFGPL)
QuantiFERON Mitogen Value: >10 IU/mL
QuantiFERON Nil Value: 0.11 IU/mL
QuantiFERON TB1 Ag Value: 3.57 IU/mL
QuantiFERON TB2 Ag Value: 3.61 IU/mL

## 2020-05-21 LAB — GLUCOSE, CAPILLARY
Glucose-Capillary: 116 mg/dL — ABNORMAL HIGH (ref 70–99)
Glucose-Capillary: 299 mg/dL — ABNORMAL HIGH (ref 70–99)

## 2020-05-21 MED ORDER — OXYCODONE HCL 5 MG PO TABS: 5.0000 mg | ORAL_TABLET | Freq: Four times a day (QID) | ORAL | 0 refills | Status: AC | PRN

## 2020-05-21 MED ORDER — ATORVASTATIN CALCIUM 40 MG PO TABS: 40.0000 mg | ORAL_TABLET | Freq: Every day | ORAL | 0 refills | Status: DC

## 2020-05-21 MED ORDER — LOSARTAN POTASSIUM 50 MG PO TABS: 50.0000 mg | ORAL_TABLET | Freq: Every day | ORAL | 0 refills | Status: DC

## 2020-05-21 MED ORDER — ASPIRIN EC 81 MG PO TBEC: 81.0000 mg | DELAYED_RELEASE_TABLET | Freq: Every day | ORAL | 0 refills | Status: DC

## 2020-05-21 NOTE — Plan of Care (Signed)
  Problem: Activity: Goal: Risk for activity intolerance will decrease Outcome: Progressing   Problem: Nutrition: Goal: Adequate nutrition will be maintained Outcome: Progressing   Problem: Pain Managment: Goal: General experience of comfort will improve Outcome: Progressing   Problem: Safety: Goal: Ability to remain free from injury will improve Outcome: Progressing   Problem: Skin Integrity: Goal: Risk for impaired skin integrity will decrease Outcome: Progressing   Problem: Education: Goal: Understanding of CV disease, CV risk reduction, and recovery process will improve Outcome: Progressing Goal: Individualized Educational Video(s) Outcome: Progressing   Problem: Activity: Goal: Ability to return to baseline activity level will improve Outcome: Progressing   Problem: Cardiovascular: Goal: Ability to achieve and maintain adequate cardiovascular perfusion will improve Outcome: Progressing Goal: Vascular access site(s) Level 0-1 will be maintained Outcome: Progressing   Problem: Health Behavior/Discharge Planning: Goal: Ability to safely manage health-related needs after discharge will improve Outcome: Progressing

## 2020-05-21 NOTE — Discharge Summary (Signed)
Physician Discharge Summary  Allen Yu [id number] DOB: Jun 18, 1945 DOA: 05/17/2020  PCP: [name], MD  Admit date: 05/17/2020 Discharge date: 05/21/2020  Admitted From: Home Disposition: Home  Recommendations for Outpatient Follow-up:  1. Follow up with PCP in 1-2 weeks 2. Vascular Surgery plans left femoral to peroneal bypass by Dr. Donnetta Hutching next Friday, May 27, 2020 3. Continue to encourage tobacco cessation  Home Health: PT/OT/RN/aide/SW (refused SNF) Equipment/Devices: Wheelchair  Discharge Condition: Stable CODE STATUS: Full code Diet recommendation: Heart healthy/consistent carbohydrate diet  History of present illness:  Allen Yu is a 75 year old male with past medical history of tuberculosis, PTSD, essential hypertension, GERD, type 1 diabetes mellitus, dementia, CKD who presents to the ED with complaint of foot pain.  Patient has reported 4 months of persistent foot pain and ulcer on the heel of his left foot.  He has been to his PCP and podiatrist to told him "there is nothing to do for it".  He reports has taken antibiotics for it.  Patient has attempted to treat with creams, heel balls, and dressings.  He reports occasional bloody discharge and malodorous discharge.  Patient denies fevers, no chest pain, no palpitations, no shortness of breath, no abdominal pain.  In the ED, temperature 97.5 F, HR 78, RR 15, BP 137/76, SPO2 100% on room air.  Sodium 134, potassium 3.5, CO2 26, chloride 99, BUN 15, creatinine 1.26, glucose 330.  Lactic acid 0.8.  WBC 6.7, hemoglobin 9.4, platelets 260.  SARS-CoV-2 PCR negative.  Influenza A/B PCR negative.  Left foot x-ray with diffuse severe osteopenia and degenerative change, questional erosion medial aspect left distal second and third metatarsals, soft tissue wound over the heel with no adjacent bony erosion and no radiopaque foreign body.  CT angiogram aortobifemoral with embolic contrast with extensive atherosclerotic plaque  without high-grade occlusion or stenosis and bilateral internal iliac artery high-grade stenosis, left/right SFA stenosis.  Vascular surgery was consulted, hospital service consulted for further evaluation and management of diabetic foot infection.  Hospital course:  Diabetic foot infection Left lower extremity critical limb ischemia Patient presenting to the ED with left foot pain with associated heel ulcer that been progressing for the last 4 months.  Patient is afebrile without leukocytosis.  Left foot x-ray with diffuse severe osteopenia/degenerative change with questionable erosions medial aspect distal left second/third metatarsal and soft tissue wound over heel. ABI with mild RLE arterial disease and LLE with non-compressible arteries w/ absent toe brachial index.  Patient underwent abdominal aortogram with bilateral lower extremity runoff by vascular surgery, Dr. Oneida Alar on 05/20/2020 with findings of long segment occlusion of the left superficial femoral artery and diffusely diseased popliteal artery.  Vascular surgery planning left femoral to peroneal bypass by Dr. Donnetta Hutching next Friday, May 27, 2020. Continue aspirin 81 mg p.o. daily and atorvastatin 40 mg p.o. daily.  Patient refused SNF placement.  Discharging with home health and wheelchair.  Type 2 diabetes mellitus, with hyperglycemia Hemoglobin A1c 9.5 on 05/18/2020, poorly controlled.  Home regimen includes 60 units qPM, Humalog 5-10 units TID prn.  Outpatient follow-up with PCP for further titration of insulin regimen.  Essential hypertension Losartan 50 mg p.o. nightly  History of tuberculosis Patient reports has been treated twice for tuberculosis infection.  Patient follows with infectious disease, Dr. Megan Salon outpatient.  Last seen in clinic December 2021 in which medications were discontinued due to nausea/vomiting and weight loss.  Also repeat sputum culture with growth of Mycobacterium malmonese, which is considered an  insignificant contaminant per ID.  Patient is asymptomatic, chest x-ray with right upper lobe linear density improved likely scarring and no infiltrate or cavitary lesion.  Discussed with ID, Dr. Gale Journey on 4/27; no further treatment indicated at this time; and can remove airborne isolation precautions. Outpatient follow-up with ID as needed.  CKD stage IIIa Baseline creatinine 1.2-1.3. Cr 1.25 at time of discharge..  Severe protein calorie malnutrition Body mass index is 16.2 kg/m.  Evidenced by cachexia and muscle wasting. Nutrition Status: Nutrition Problem: Severe Malnutrition Etiology: chronic illness (hx of tuberculosis) Signs/Symptoms: severe fat depletion,severe muscle depletion Interventions: Ensure Enlive (each supplement provides 350kcal and 20 grams of protein) --nutrition following  Dementia Donezepil 10 mg p.o. nightly  Tobacco use disorder Counseled on need for complete cessation given heel wound.  Declines nicotine patch.  Discharge Diagnoses:  Principal Problem:   Critical lower limb ischemia (HCC) Active Problems:   Hypertension   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HCC)   Tobacco use disorder   Protein-calorie malnutrition, severe    Discharge Instructions  Discharge Instructions    Call MD for:  difficulty breathing, headache or visual disturbances   Complete by: As directed    Call MD for:  extreme fatigue   Complete by: As directed    Call MD for:  persistant dizziness or light-headedness   Complete by: As directed    Call MD for:  persistant nausea and vomiting   Complete by: As directed    Call MD for:  severe uncontrolled pain   Complete by: As directed    Call MD for:  temperature >100.4   Complete by: As directed    Diet - low sodium heart healthy   Complete by: As directed    Increase activity slowly   Complete by: As directed    No wound care   Complete by: As directed      Allergies as of 05/21/2020      Reactions   Bee  Venom Anaphylaxis   Codeine Other (See Comments)   incoherent    Diovan [valsartan] Other (See Comments)   incoherent   Propoxyphene Other (See Comments)   Dizziness, "Makes me feel drunk"      Medication List    STOP taking these medications   acetaminophen 500 MG tablet Commonly known as: TYLENOL   aspirin-sod bicarb-citric acid 325 MG Tbef tablet Commonly known as: ALKA-SELTZER   ethambutol 400 MG tablet Commonly known as: MYAMBUTOL   isoniazid 300 MG tablet Commonly known as: NYDRAZID   pyrazinamide 500 MG tablet   rifampin 300 MG capsule Commonly known as: RIFADIN   vitamin B-6 25 MG tablet Commonly known as: pyridOXINE     TAKE these medications   aspirin EC 81 MG tablet Take 1 tablet (81 mg total) by mouth at bedtime.   atorvastatin 40 MG tablet Commonly known as: LIPITOR Take 1 tablet (40 mg total) by mouth at bedtime.   B-D UF III MINI PEN NEEDLES 31G X 5 MM Misc Generic drug: Insulin Pen Needle SMARTSIG:1 Each SUB-Q Daily   BC FAST PAIN RELIEF PO Take 1 packet by mouth daily as needed (pain).   donepezil 10 MG tablet Commonly known as: ARICEPT Take 10 mg by mouth at bedtime.   gabapentin 100 MG capsule Commonly known as: NEURONTIN Take 100 mg by mouth at bedtime.   insulin lispro 100 UNIT/ML injection Commonly known as: HUMALOG Inject 5-10 Units into the skin daily as needed for high blood sugar.  Lantus 100 UNIT/ML injection Generic drug: insulin glargine Inject 60 Units into the skin at bedtime.   losartan 50 MG tablet Commonly known as: COZAAR Take 1 tablet (50 mg total) by mouth at bedtime.   oxyCODONE 5 MG immediate release tablet Commonly known as: Oxy IR/ROXICODONE Take 1 tablet (5 mg total) by mouth every 6 (six) hours as needed for up to 5 days for moderate pain.            Durable Medical Equipment  (From admission, onward)         Start     Ordered   05/21/20 0757  For home use only DME standard manual wheelchair  with seat cushion  Once       Comments: Patient suffers from critical left lower extremity limb ischemia with debility, deconditioning, and unsteady gait which impairs their ability to perform daily activities like ambulating in the home.  A walker will not resolve issue with performing activities of daily living. A wheelchair will allow patient to safely perform daily activities. Patient can safely propel the wheelchair in the home or has a caregiver who can provide assistance. Length of need 12 months . Accessories: elevating leg rests (ELRs), wheel locks, extensions and anti-tippers.   05/21/20 0757          Follow-up Information    [name], MD. Schedule an appointment as soon as possible for a visit in 1 week(s).   Specialty: Internal Medicine Contact information: [address] [place] [address] [phone]              Allergies  Allergen Reactions  . Bee Venom Anaphylaxis  . Codeine Other (See Comments)    incoherent   . Diovan [Valsartan] Other (See Comments)    incoherent  . Propoxyphene Other (See Comments)    Dizziness, "Makes me feel drunk"    Consultations:  Vascular surgery  Discussed with infectious disease, Dr. Gale Journey   Procedures/Studies: CT Angio Aortobifemoral W and/or Wo Contrast  Result Date: 05/17/2020 CLINICAL DATA:  Left heel nonhealing wound X 2 months. Diabetes, peripheral neuropathy, hypertension EXAM: CT ANGIOGRAPHY OF ABDOMINAL AORTA WITH ILIOFEMORAL RUNOFF TECHNIQUE: Multidetector CT imaging of the abdomen, pelvis and lower extremities was performed using the standard protocol during bolus administration of intravenous contrast. Multiplanar CT image reconstructions and MIPs were obtained to evaluate the vascular anatomy. CONTRAST:  162mL OMNIPAQUE IOHEXOL 350 MG/ML SOLN COMPARISON:  CT 07/09/2016 FINDINGS: VASCULAR Aorta: Moderate calcified atheromatous plaque. No aneurysm, dissection, or stenosis. Celiac: Calcified ostial plaque  resulting in short segment stenosis of at least mild severity, patent distally with unremarkable branch anatomy. SMA: Calcified ostial plaque resulting in short segment mild stenosis, atheromatous but patent distally. Renals: Single left, patent. Single right, with calcified ostial plaque, no high-grade stenosis, patent distally. IMA: Origin occlusion. Distal branches reconstituted by visceral collaterals. RIGHT Lower Extremity Inflow: Common iliac moderate calcified atheromatous plaque, 1.5 cm diameter, no high-grade stenosis. Internal iliac ostial stenosis due to heavily calcified plaque. External iliac atheromatous, patent. Outflow: Common femoral atheromatous, patent. Deep femoral branches patent. SFA atheromatous proximal plaque resulting in mild stenosis, with scattered plaque throughout, stenosis at the adductor hiatus of at least moderate severity. Popliteal diffusely atheromatous with tandem areas of at least mild stenosis. Runoff: Peroneal diseased runoff with a prominent posterior communicating branch supplying the foot. Anterior and posterior tibial artery proximal occlusion. LEFT Lower Extremity Inflow: Common iliac diffusely atheromatous without high-grade stenosis. Internal iliac heavily calcified proximal stenosis. External iliac  mildly atheromatous, patent. Outflow: Common femoral atheromatous, patent. Deep femoral branches patent. SFA origin short-segment stenosis of at least mild severity, and distal segmental occlusion. Popliteal reconstitution at the adductor hiatus, with extensive atheromatous plaque throughout its length with tandem areas of at least moderate stenosis. Runoff: Anterior and posterior tibial artery proximal occlusion. Diseased peroneal runoff with prominent posterior communicating branch supplying the foot. Veins: No obvious venous abnormality within the limitations of this arterial phase study.1 Review of the MIP images confirms the above findings. NON-VASCULAR Lower chest: No  pleural or pericardial effusion. Visualized lung bases clear. Hepatobiliary: No focal liver abnormality is seen. Status post cholecystectomy. No biliary dilatation. Pancreas: Unremarkable. No pancreatic ductal dilatation or surrounding inflammatory changes. Spleen: Normal in size without focal abnormality. Adrenals/Urinary Tract: Adrenal glands unremarkable. 1.8 cm probable cyst, left upper pole kidney. No hydronephrosis. Urinary bladder is distended. Stomach/Bowel: Stomach and small bowel decompressed. Appendix surgically absent. The colon is nondilated, with a few sigmoid diverticula; no adjacent inflammatory change. Lymphatic: No abdominal or pelvic adenopathy localized. Reproductive: Prostate enlargement. Other: No ascites.  Bilateral pelvic phleboliths.  No free air. Musculoskeletal: Advanced multilevel lumbar spondylitic change. Solid-appearing PLIF L4-S1. no acute fracture or worrisome bone lesion. IMPRESSION: 1. Aortoiliac extensive atheromatous plaque without high-grade occlusion or stenosis. 2. 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 3. RIGHT SFA stenoses with diseased peroneal runoff. 4. LEFT SFA proximal stenosis and segmental distal occlusion, with reconstituted diseased peroneal runoff. Electronically Signed   By: Lucrezia Europe M.D.   On: 05/17/2020 12:51   PERIPHERAL VASCULAR CATHETERIZATION  Result Date: 05/20/2020 Procedure: Abdominal aortogram with bilateral lower extremity runoff, ultrasound right groin Preoperative diagnosis: Nonhealing wound left leg Postoperative diagnosis: Same Anesthesia: Local Operative findings: Long segment occlusion left superficial femoral artery Diffusely diseased popliteal artery One-vessel peroneal runoff to the left foot Right leg has diffusely diseased multiple segment 90% stenosis SFA diffusely diseased popliteal one-vessel peroneal runoff to the right foot Operative details: Hip pain informed consent, the patient was taken the [place] lab.  The  patient was placed in supine position on the angio table.  Both groins were prepped and draped in usual sterile fashion.  Local anesthesia was infiltrated over the right common femoral artery femoral to right common femoral artery and a 3 5 versa core wire threaded up the abdominal aorta under fluoroscopic guidance.  Next a 5 French sheath was placed over the guidewire in the right common femoral artery.  This was thoroughly flushed with saline.  5 French pigtail catheter was advanced over the guidewire in the abdominal aorta.  Abdominal aortogram was obtained in AP projection.  Infrarenal abdominal aorta is patent.  This is heavily calcified.  The left and right renal arteries are patent.  The left and right common iliac arteries are patent but again heavily calcified.  There is a 90% stenosis of the right internal iliac artery origin.  The left and right internal iliac arteries are patent.  Bilateral oblique views of the pelvis were obtained due to overlying orthopedic hardware.  This confirmed the above findings with the external iliac artery patent bilaterally. Next bilateral lower extremity runoff views were obtained through the pigtail catheter.  In the left lower extremity the left common femoral artery is patent.  There is a tapered narrowing of the femoral bifurcation with a widely patent profunda but subtotal occlusion of the origin of the left superficial femoral artery.  The left superficial femoral artery then occludes at the adductor hiatus over several centimeters.  The popliteal artery reconstitutes but is diffusely diseased all the way down into the below-knee popliteal artery.  There is one-vessel peroneal runoff. There are similar findings in the right leg although the superficial femoral artery in the right leg is slightly less diseased. At this point to get better opacification of the tibial vessels the pigtail catheter was removed over guidewire and swapped out for a 5 [place] crossover  catheter.  I then advanced an 035 angled Glidewire down in the left common femoral artery.  Crossover catheter was removed and swapped out for a 5 French straight catheter.  Left lower extremity arteriogram was obtained.  This showed again a diffusely diseased below-knee popliteal artery with a short segment occlusion of the origin of the tibioperoneal trunk.  There is one-vessel peroneal runoff to the left foot.  The vessels in the left foot are diffusely diseased. At this point a 5 [place] straight catheter was removed over guidewire.  The 5 French sheath was left in place to be pulled in the holding area. Operative management: Patient will be scheduled for a left femoral to peroneal bypass by Dr. Donnetta Hutching next Friday, May 27, 2020.  We will schedule the patient for a vein mapping ultrasound today to see if he has adequate conduit for this bypass.  If he had very small saphenous vein bilaterally that was inadequate for use as a bypass graft consideration could be given for attempted recanalization of his left superficial femoral artery and angioplasty of his peroneal artery.  However this would be fairly high risk of distal embolization and I would only reserve this if he has an adequate bypass conduit for peroneal bypass. The patient would be okay to go home today after his bedrest time and after he gets his vein mapping. Ruta Hinds, MD [facility] Office: [phone]   DG CHEST PORT 1 VIEW  Result Date: 05/18/2020 CLINICAL DATA:  History of TB. EXAM: PORTABLE CHEST 1 VIEW COMPARISON:  Chest x-ray 03/06/2019.  CT 06/30/2019. FINDINGS: Mediastinum hilar structures normal. Linear density previously noted the right upper lung has improved with minimal residual pleuroparenchymal thickening most likely scarring. No focal infiltrate. No pleural effusion or pneumothorax. Heart size normal. Prior cervical spine fusion. Surgical clips right upper quadrant. IMPRESSION: Linear density  previously noted in the right upper lung is improved with minimal residual pleuroparenchymal thickening most likely scarring. No focal infiltrate or cavitary lesion. Electronically Signed   By: Marcello Moores  Register   On: 05/18/2020 07:15   DG Foot Complete Left  Result Date: 05/17/2020 CLINICAL DATA:  Pain.  Wound over heel EXAM: LEFT FOOT - COMPLETE 3+ VIEW COMPARISON:  Left ankle 03/11/2020. FINDINGS: Diffuse severe osteopenia and degenerative change. Question erosions of the medial aspect of the distal left second and third metatarsals. These findings may just be related to severe osteopenia. Clinical correlation suggested. No evidence of fracture or dislocation. Soft tissue wound noted over the heel. No adjacent bony erosion. No radiopaque foreign body. Peripheral vascular calcification. IMPRESSION: 1. Diffuse severe osteopenia and degenerative change. Questionable erosions of the medial aspect of the distal left second and third metatarsals. These findings may just be related to the severe osteopenia. Clinical correlation is suggested. No evidence of fracture or dislocation. 2. Soft tissue wound over the heel. No adjacent bony erosion. No radiopaque foreign body. Peripheral vascular calcification. Electronically Signed   By: Marcello Moores  Register   On: 05/17/2020 09:23   VAS [place] ABI WITH/WO TBI  Result Date: 05/18/2020  LOWER EXTREMITY DOPPLER STUDY Patient Name:  MOUHAMED GLASSCO  Date of Exam:   05/18/2020 Medical Rec #: [id number]       Accession #:    [id number] Date of Birth: 10-01-1945      Patient Gender: M Patient Age:   66Y Exam Location:  [facility] Procedure:      VAS [place] ABI WITH/WO TBI Referring Phys: [id number] [place] --------------------------------------------------------------------------------  Indications: Rest pain, ulceration, and Patient is a transfer from [facility],              severe PVD seen on arteriogram. High Risk         Hypertension, Diabetes, current smoker, coronary  artery Factors:          disease. Other Factors: Carotid stenosis, CKD, neuropathy, PAD.  Comparison Study: No previous exams Performing Technologist: Hill, Jody RVT, RDMS  Examination Guidelines: A complete evaluation includes at minimum, Doppler waveform signals and systolic blood pressure reading at the level of bilateral brachial, anterior tibial, and posterior tibial arteries, when vessel segments are accessible. Bilateral testing is considered an integral part of a complete examination. Photoelectric Plethysmograph (PPG) waveforms and toe systolic pressure readings are included as required and additional duplex testing as needed. Limited examinations for reoccurring indications may be performed as noted.  ABI Findings: +---------+------------------+-----+----------+--------+ Right    Rt Pressure (mmHg)IndexWaveform  Comment  +---------+------------------+-----+----------+--------+ Brachial                        biphasic  IV       +---------+------------------+-----+----------+--------+ PTA      122               0.82 biphasic           +---------+------------------+-----+----------+--------+ DP       126               0.85 monophasic         +---------+------------------+-----+----------+--------+ Great Toe15                0.10 Abnormal           +---------+------------------+-----+----------+--------+ +---------+------------------+-----+-------------------+-------+ Left     Lt Pressure (mmHg)IndexWaveform           Comment +---------+------------------+-----+-------------------+-------+ Brachial 148                    triphasic                  +---------+------------------+-----+-------------------+-------+ PTA      198               1.34 monophasic                 +---------+------------------+-----+-------------------+-------+ DP       188               1.27 dampened monophasic        +---------+------------------+-----+-------------------+-------+  Great Toe0                 0.00 Absent                     +---------+------------------+-----+-------------------+-------+  Summary: Right: Resting right ankle-brachial index indicates mild right lower extremity arterial disease. The right toe-brachial index is essentially zero. Left: Resting left ankle-brachial index indicates noncompressible left lower extremity arteries and could underestimate severity of disease. The left toe-brachial index is absent. ABIs are unreliable.  *See table(s) above for  measurements and observations.  Electronically signed by Harold Barban MD on 05/18/2020 at 6:17:01 PM.    Final    VAS [place] LOWER EXTREMITY SAPHENOUS VEIN MAPPING  Result Date: 05/20/2020 [place] MAPPING Patient Name:  KHYLEN KNAUER  Date of Exam:   05/20/2020 Medical Rec #: [id number]       Accession #:    [id number] Date of Birth: 1945-07-14      Patient Gender: M Patient Age:   33Y Exam Location:  [facility] Procedure:      VAS [place] LOWER EXTREMITY SAPHENOUS VEIN MAPPING Referring Phys: [place] --------------------------------------------------------------------------------  Indications: PAD, Pre-op LT fem-pop bypass  Comparison Study: No prior studies. Performing Technologist: Darlin Coco RDMS,RVT  Examination Guidelines: A complete evaluation includes B-mode imaging, spectral Doppler, color Doppler, and power Doppler as needed of all accessible portions of each vessel. Bilateral testing is considered an integral part of a complete examination. Limited examinations for reoccurring indications may be performed as noted. +--------------+-----------+-------------------+--------------+----------------+  RT Diameter  RT Findings        GSV         LT Diameter    LT Findings         (cm)                                        (cm)                      +--------------+-----------+-------------------+--------------+----------------+      0.24                   Saphenofemoral        0.32                                                    Junction                                     +--------------+-----------+-------------------+--------------+----------------+      0.15                  Proximal thigh        0.22                      +--------------+-----------+-------------------+--------------+----------------+      0.19                     Mid thigh          0.22                      +--------------+-----------+-------------------+--------------+----------------+      0.24                   Distal thigh         0.26        branching     +--------------+-----------+-------------------+--------------+----------------+      0.21                       Knee  0.18                      +--------------+-----------+-------------------+--------------+----------------+      0.17                     Prox calf          0.21                      +--------------+-----------+-------------------+--------------+----------------+      0.14                     Mid calf           0.17                      +--------------+-----------+-------------------+--------------+----------------+      0.15                    Distal calf                   branching and                                                              not visualized  +--------------+-----------+-------------------+--------------+----------------+      0.15                       Ankle                                      +--------------+-----------+-------------------+--------------+----------------+    Preliminary    VAS US CAROTID  Result Date: 05/18/2020 Carotid Arterial Duplex Study Patient Name:  ABDOUL BLACKMON  Date of Exam:   05/18/2020 Medical Rec #: [id number]       Accession #:    [id number] Date of Birth: October 26, 1945      Patient Gender: M Patient Age:   30Y Exam Location:  [facility] Procedure:      VAS US CAROTID Referring Phys:  [place] --------------------------------------------------------------------------------  Indications:       Carotid artery disease. Risk Factors:      Hypertension, Diabetes. Comparison Study:  07/29/2015 carotid artery duplex- bilateral 1-39% carotid                    artery stenosis. Performing Technologist: Darlin Coco RDMS,RVT  Examination Guidelines: A complete evaluation includes B-mode imaging, spectral Doppler, color Doppler, and power Doppler as needed of all accessible portions of each vessel. Bilateral testing is considered an integral part of a complete examination. Limited examinations for reoccurring indications may be performed as noted.  Right Carotid Findings: +----------+-------+-------+--------+------------------------+-----------------+           PSV    EDV    StenosisPlaque Description      Comments                    cm/s   cm/s                                                     +----------+-------+-------+--------+------------------------+-----------------+  CCA Prox  93     9                                                        +----------+-------+-------+--------+------------------------+-----------------+ CCA Distal85     16                                     intimal                                                                   thickening        +----------+-------+-------+--------+------------------------+-----------------+ ICA Prox  100    27             heterogenous and                                                          irregular                                 +----------+-------+-------+--------+------------------------+-----------------+ ICA Distal81     29                                                       +----------+-------+-------+--------+------------------------+-----------------+ ECA       70                    irregular and                                                              heterogenous                              +----------+-------+-------+--------+------------------------+-----------------+ +----------+--------+-------+----------------+-------------------+           PSV cm/sEDV cmsDescribe        Arm Pressure (mmHG) +----------+--------+-------+----------------+-------------------+ [id number]            Multiphasic, WNL                    +----------+--------+-------+----------------+-------------------+ +---------+--------+--+--------+--+---------+ VertebralPSV cm/s77EDV cm/s18Antegrade +---------+--------+--+--------+--+---------+  Left Carotid Findings: +----------+-------+-------+--------+------------------------+-----------------+           PSV    EDV    StenosisPlaque Description      Comments                    cm/s   cm/s                                                     +----------+-------+-------+--------+------------------------+-----------------+  CCA Prox  160    27                                     intimal                                                                   thickening        +----------+-------+-------+--------+------------------------+-----------------+ CCA Distal81     21                                     intimal                                                                   thickening        +----------+-------+-------+--------+------------------------+-----------------+ ICA Prox  70     16             heterogenous and                                                          irregular                                 +----------+-------+-------+--------+------------------------+-----------------+ ICA Distal111    32                                                       +----------+-------+-------+--------+------------------------+-----------------+ ECA       88     15                                     intimal                                                                    thickening        +----------+-------+-------+--------+------------------------+-----------------+ +----------+--------+--------+----------------+-------------------+           PSV cm/sEDV cm/sDescribe        Arm Pressure (mmHG) +----------+--------+--------+----------------+-------------------+ Subclavian                Multiphasic, WNL                    +----------+--------+--------+----------------+-------------------+ +---------+--------+--+--------+--+---------+  VertebralPSV cm/s75EDV cm/s21Antegrade +---------+--------+--+--------+--+---------+   Summary: Right Carotid: Velocities in the right ICA are consistent with a 1-39% stenosis. Left Carotid: Velocities in the left ICA are consistent with a 1-39% stenosis. Vertebrals:  Bilateral vertebral arteries demonstrate antegrade flow. Subclavians: Normal flow hemodynamics were seen in bilateral subclavian              arteries. *See table(s) above for measurements and observations.  Electronically signed by Harold Barban MD on 05/18/2020 at 6:16:32 PM.    Final      Subjective:   Discharge Exam: Vitals:   05/20/20 1910 05/21/20 0720  BP: (!) 153/94 132/87  Pulse: 65 97  Resp: 18 19  Temp: 98.2 F (36.8 C) 98.3 F (36.8 C)  SpO2: 97%    Vitals:   05/20/20 1725 05/20/20 1910 05/21/20 0640 05/21/20 0720  BP: (!) 161/78 (!) 153/94  132/87  Pulse: (!) 107 65  97  Resp:  18  19  Temp:  98.2 F (36.8 C)  98.3 F (36.8 C)  TempSrc:  Oral  Oral  SpO2: 97% 97%    Weight:   (P) 54.3 kg   Height:        General: Pt is alert, awake, not in acute distress Cardiovascular: RRR, S1/S2 +, no rubs, no gallops Respiratory: CTA bilaterally, no wheezing, no rhonchi Abdominal: Soft, NT, ND, bowel sounds + Extremities: no edema, no cyanosis, Necrotic ulcer left heel, malodorous drainage, no surrounding erythema; otherwise no other ulcerations noted.        The results of significant  diagnostics from this hospitalization (including imaging, microbiology, ancillary and laboratory) are listed below for reference.     Microbiology: Recent Results (from the past 240 hour(s))  Resp Panel by RT-PCR (Flu A&B, Covid) Nasopharyngeal Swab     Status: None   Collection Time: 05/17/20  9:10 AM   Specimen: Nasopharyngeal Swab; Nasopharyngeal(NP) swabs in vial transport medium  Result Value Ref Range Status   SARS Coronavirus 2 by RT PCR NEGATIVE NEGATIVE Final    Comment: (NOTE) SARS-CoV-2 target nucleic acids are NOT DETECTED.  The SARS-CoV-2 RNA is generally detectable in upper respiratory specimens during the acute phase of infection. The lowest concentration of SARS-CoV-2 viral copies this assay can detect is 138 copies/mL. A negative result does not preclude SARS-Cov-2 infection and should not be used as the sole basis for treatment or other patient management decisions. A negative result may occur with  improper specimen collection/handling, submission of specimen other than nasopharyngeal swab, presence of viral mutation(s) within the areas targeted by this assay, and inadequate number of viral copies(<138 copies/mL). A negative result must be combined with clinical observations, patient history, and epidemiological information. The expected result is Negative.  Fact Sheet for Patients:  EntrepreneurPulse.com.au  Fact Sheet for Healthcare Providers:  IncredibleEmployment.be  This test is no t yet approved or cleared by the [place] FDA and  has been authorized for detection and/or diagnosis of SARS-CoV-2 by FDA under an Emergency Use Authorization (EUA). This EUA will remain  in effect (meaning this test can be used) for the duration of the COVID-19 declaration under Section 564(b)(1) of the Act, 21 U.S.C.section 360bbb-3(b)(1), unless the authorization is terminated  or revoked sooner.       Influenza A by PCR NEGATIVE  NEGATIVE Final   Influenza B by PCR NEGATIVE NEGATIVE Final    Comment: (NOTE) The Xpert Xpress SARS-CoV-2/FLU/RSV plus assay is intended as an aid in the diagnosis of influenza from  Nasopharyngeal swab specimens and should not be used as a sole basis for treatment. Nasal washings and aspirates are unacceptable for Xpert Xpress SARS-CoV-2/FLU/RSV testing.  Fact Sheet for Patients: EntrepreneurPulse.com.au  Fact Sheet for Healthcare Providers: IncredibleEmployment.be  This test is not yet approved or cleared by the [place] FDA and has been authorized for detection and/or diagnosis of SARS-CoV-2 by FDA under an Emergency Use Authorization (EUA). This EUA will remain in effect (meaning this test can be used) for the duration of the COVID-19 declaration under Section 564(b)(1) of the Act, 21 U.S.C. section 360bbb-3(b)(1), unless the authorization is terminated or revoked.  Performed at [facility], [address], [place], [place] [address]   Culture, blood (routine x 2)     Status: None (Preliminary result)   Collection Time: 05/17/20  9:37 AM   Specimen: BLOOD LEFT HAND  Result Value Ref Range Status   Specimen Description BLOOD LEFT HAND  Final   Special Requests   Final    BOTTLES DRAWN AEROBIC AND ANAEROBIC Blood Culture results may not be optimal due to an inadequate volume of blood received in culture bottles   Culture   Final    NO GROWTH 4 DAYS Performed at [facility], [address] [place], [place] [address]    Report Status PENDING  Incomplete  Culture, blood (routine x 2)     Status: None (Preliminary result)   Collection Time: 05/17/20  9:38 AM   Specimen: BLOOD RIGHT HAND  Result Value Ref Range Status   Specimen Description BLOOD RIGHT HAND  Final   Special Requests   Final    BOTTLES DRAWN AEROBIC AND ANAEROBIC Blood Culture adequate volume   Culture   Final    NO GROWTH 4 DAYS Performed at [facility], [address], [place], [place] [address]    Report Status PENDING  Incomplete     Labs: BNP (last 3 results) No results for input(s): BNP in the last 8760 hours. Basic Metabolic Panel: Recent Labs  Lab 05/17/20 0842 05/18/20 0845 05/19/20 0236 05/19/20 1626 05/20/20 0548 05/21/20 0324  NA 134* 140 137  --  137 136  K 3.5 3.3* 2.9* 4.5 4.2 4.0  CL 99 103 102  --  102 100  CO2 26 28 26   --  26 25  GLUCOSE 330* 54* 65*  --  72 101*  BUN 15 12 17   --  10 11  CREATININE 1.26* 1.19 1.31*  --  1.26* 1.28*  CALCIUM 9.1 9.4 8.9  --  9.3 8.9  MG  --  2.0 1.9 1.9 2.0  --    Liver Function Tests: Recent Labs  Lab 05/18/20 0845 05/19/20 0236  AST 19 16  ALT 11 10  ALKPHOS 115 99  BILITOT 1.3* 0.8  PROT 6.7 6.0*  ALBUMIN 3.5 3.0*   No results for input(s): LIPASE, AMYLASE in the last 168 hours. No results for input(s): AMMONIA in the last 168 hours. CBC: Recent Labs  Lab 05/17/20 0842 05/18/20 0845 05/19/20 0236 05/20/20 0548 05/21/20 0324  WBC 6.7 9.8 8.6 9.1 7.7  NEUTROABS 5.1  --   --   --   --   HGB 9.4* 10.2* 8.7* 9.5* 8.5*  HCT 27.4* 30.0* 25.5* 28.5* 24.9*  MCV 91.9 90.9 90.1 91.9 91.5  PLT 260 324 277 281 285   Cardiac Enzymes: No results for input(s): CKTOTAL, CKMB, CKMBINDEX, TROPONINI in the last 168 hours. BNP: Invalid input(s): POCBNP CBG: Recent Labs  Lab 05/20/20 1247 05/20/20 1338 05/20/20 1643 05/20/20 2124 05/21/20 0718  GLUCAP 52* 98 86 249* 116*   D-Dimer No results for input(s): DDIMER in the last 72 hours. Hgb A1c Recent Labs    05/19/20 0236  HGBA1C 9.2*   Lipid Profile Recent Labs    05/19/20 0236  CHOL 118  HDL 40*  LDLCALC 68  TRIG 52  CHOLHDL 3.0   Thyroid function studies No results for input(s): TSH, T4TOTAL, T3FREE, THYROIDAB in the last 72 hours.  Invalid input(s): FREET3 Anemia work up No results for input(s): VITAMINB12, FOLATE, FERRITIN, TIBC, IRON, RETICCTPCT in the last 72 hours. Urinalysis    Component Value  Date/Time   COLORURINE YELLOW 07/09/2016 Veteran 07/09/2016 0733   LABSPEC 1.013 07/09/2016 0733   PHURINE 5.0 07/09/2016 0733   GLUCOSEU NEGATIVE 07/09/2016 0733   HGBUR SMALL (A) 07/09/2016 0733   BILIRUBINUR NEGATIVE 07/09/2016 0733   KETONESUR 20 (A) 07/09/2016 0733   PROTEINUR 30 (A) 07/09/2016 0733   UROBILINOGEN 0.2 02/18/2014 0950   NITRITE NEGATIVE 07/09/2016 0733   LEUKOCYTESUR NEGATIVE 07/09/2016 0733   Sepsis Labs Invalid input(s): PROCALCITONIN,  WBC,  LACTICIDVEN Microbiology Recent Results (from the past 240 hour(s))  Resp Panel by RT-PCR (Flu A&B, Covid) Nasopharyngeal Swab     Status: None   Collection Time: 05/17/20  9:10 AM   Specimen: Nasopharyngeal Swab; Nasopharyngeal(NP) swabs in vial transport medium  Result Value Ref Range Status   SARS Coronavirus 2 by RT PCR NEGATIVE NEGATIVE Final    Comment: (NOTE) SARS-CoV-2 target nucleic acids are NOT DETECTED.  The SARS-CoV-2 RNA is generally detectable in upper respiratory specimens during the acute phase of infection. The lowest concentration of SARS-CoV-2 viral copies this assay can detect is 138 copies/mL. A negative result does not preclude SARS-Cov-2 infection and should not be used as the sole basis for treatment or other patient management decisions. A negative result may occur with  improper specimen collection/handling, submission of specimen other than nasopharyngeal swab, presence of viral mutation(s) within the areas targeted by this assay, and inadequate number of viral copies(<138 copies/mL). A negative result must be combined with clinical observations, patient history, and epidemiological information. The expected result is Negative.  Fact Sheet for Patients:  EntrepreneurPulse.com.au  Fact Sheet for Healthcare Providers:  IncredibleEmployment.be  This test is no t yet approved or cleared by the [place] FDA and  has been  authorized for detection and/or diagnosis of SARS-CoV-2 by FDA under an Emergency Use Authorization (EUA). This EUA will remain  in effect (meaning this test can be used) for the duration of the COVID-19 declaration under Section 564(b)(1) of the Act, 21 U.S.C.section 360bbb-3(b)(1), unless the authorization is terminated  or revoked sooner.       Influenza A by PCR NEGATIVE NEGATIVE Final   Influenza B by PCR NEGATIVE NEGATIVE Final    Comment: (NOTE) The Xpert Xpress SARS-CoV-2/FLU/RSV plus assay is intended as an aid in the diagnosis of influenza from Nasopharyngeal swab specimens and should not be used as a sole basis for treatment. Nasal washings and aspirates are unacceptable for Xpert Xpress SARS-CoV-2/FLU/RSV testing.  Fact Sheet for Patients: EntrepreneurPulse.com.au  Fact Sheet for Healthcare Providers: IncredibleEmployment.be  This test is not yet approved or cleared by the [place] FDA and has been authorized for detection and/or diagnosis of SARS-CoV-2 by FDA under an Emergency Use Authorization (EUA). This EUA will remain in effect (meaning this test can be used) for the  duration of the COVID-19 declaration under Section 564(b)(1) of the Act, 21 U.S.C. section 360bbb-3(b)(1), unless the authorization is terminated or revoked.  Performed at [facility], [address] [place], [place] [address]   Culture, blood (routine x 2)     Status: None (Preliminary result)   Collection Time: 05/17/20  9:37 AM   Specimen: BLOOD LEFT HAND  Result Value Ref Range Status   Specimen Description BLOOD LEFT HAND  Final   Special Requests   Final    BOTTLES DRAWN AEROBIC AND ANAEROBIC Blood Culture results may not be optimal due to an inadequate volume of blood received in culture bottles   Culture   Final    NO GROWTH 4 DAYS Performed at [facility], [address], [place], [place] [address]    Report Status PENDING  Incomplete   Culture, blood (routine x 2)     Status: None (Preliminary result)   Collection Time: 05/17/20  9:38 AM   Specimen: BLOOD RIGHT HAND  Result Value Ref Range Status   Specimen Description BLOOD RIGHT HAND  Final   Special Requests   Final    BOTTLES DRAWN AEROBIC AND ANAEROBIC Blood Culture adequate volume   Culture   Final    NO GROWTH 4 DAYS Performed at [facility], [address] [place],  [address]    Report Status PENDING  Incomplete     Time coordinating discharge: Over 30 minutes  SIGNED:   Ceejay Kegley J [place] DO  [facility] 05/21/2020, 10:06 AM

## 2020-05-21 NOTE — Plan of Care (Signed)
  Problem: Activity: Goal: Risk for activity intolerance will decrease 05/21/2020 1004 by Camillia Herter, RN Outcome: Adequate for Discharge 05/21/2020 0902 by Camillia Herter, RN Outcome: Progressing   Problem: Nutrition: Goal: Adequate nutrition will be maintained 05/21/2020 1004 by Camillia Herter, RN Outcome: Adequate for Discharge 05/21/2020 0902 by Camillia Herter, RN Outcome: Progressing   Problem: Pain Managment: Goal: General experience of comfort will improve 05/21/2020 1004 by Camillia Herter, RN Outcome: Adequate for Discharge 05/21/2020 0902 by Camillia Herter, RN Outcome: Progressing   Problem: Safety: Goal: Ability to remain free from injury will improve 05/21/2020 1004 by Camillia Herter, RN Outcome: Adequate for Discharge 05/21/2020 0902 by Camillia Herter, RN Outcome: Progressing   Problem: Skin Integrity: Goal: Risk for impaired skin integrity will decrease 05/21/2020 1004 by Camillia Herter, RN Outcome: Adequate for Discharge 05/21/2020 0902 by Camillia Herter, RN Outcome: Progressing   Problem: Education: Goal: Understanding of CV disease, CV risk reduction, and recovery process will improve 05/21/2020 1004 by Camillia Herter, RN Outcome: Adequate for Discharge 05/21/2020 0902 by Camillia Herter, RN Outcome: Progressing Goal: Individualized Educational Video(s) 05/21/2020 1004 by Camillia Herter, RN Outcome: Adequate for Discharge 05/21/2020 0902 by Camillia Herter, RN Outcome: Progressing   Problem: Activity: Goal: Ability to return to baseline activity level will improve 05/21/2020 1004 by Camillia Herter, RN Outcome: Adequate for Discharge 05/21/2020 0902 by Camillia Herter, RN Outcome: Progressing   Problem: Cardiovascular: Goal: Ability to achieve and maintain adequate cardiovascular perfusion will improve 05/21/2020 1004 by Camillia Herter, RN Outcome: Adequate for Discharge 05/21/2020 0902 by Camillia Herter, RN Outcome: Progressing Goal: Vascular access site(s)  Level 0-1 will be maintained 05/21/2020 1004 by Camillia Herter, RN Outcome: Adequate for Discharge 05/21/2020 0902 by Camillia Herter, RN Outcome: Progressing   Problem: Health Behavior/Discharge Planning: Goal: Ability to safely manage health-related needs after discharge will improve 05/21/2020 1004 by Camillia Herter, RN Outcome: Adequate for Discharge 05/21/2020 0902 by Camillia Herter, RN Outcome: Progressing

## 2020-05-21 NOTE — TOC Transition Note (Signed)
Transition of Care [facility]) - CM/SW Discharge Note   Patient Details  Name: Allen Yu MRN: [id number] Date of Birth: Jul 28, 1945  [facility] CM/SW Contact:  Carles Collet, RN Phone Number: 05/21/2020, 10:56 AM   Clinical Narrative:    Damaris Schooner w patient at bedside. He is agreeable to [facility] services, no preference for provider. Could not reach wife by phone. Referral accepted to [facility].  Wheelchair to be delivered to room prior to DC    Final next level of care: [place] Barriers to Discharge: No Barriers Identified   Patient Goals and CMS Choice Patient states their goals for this hospitalization and ongoing recovery are:: to go home with hh services CMS Medicare.gov Compare Post Acute Care list provided to:: Patient Choice offered to / list presented to : Patient  Discharge Placement                       Discharge Plan and Services In-house Referral: Clinical Social Work              DME Arranged: Programmer, multimedia DME Agency: [employer] Date DME Agency Contacted: 05/21/20 Time DME Agency Contacted: 2229 Representative spoke with at DME Agency: Chrys Racer HH Arranged: RN,PT,OT,Disease Management,[facility] [place] Work [employer] Agency: [place] Date [place]: 05/21/20 Time [place]: 1056 Representative spoke with at [place]: [place] ([place]) Interventions     Readmission Risk Interventions No flowsheet data found.

## 2020-05-21 NOTE — Care Management (Signed)
    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  A walker will not resolve issue with performing activities of daily living. A wheelchair will allow patient to safely perform daily activities. Patient can safely propel the wheelchair in the home or has a caregiver who can provide assistance. Length of need 12 months . Accessories: elevating leg rests (ELRs), wheel locks, extensions and anti-tippers.   05/21/20 0757

## 2020-05-22 LAB — CULTURE, BLOOD (ROUTINE X 2)
Culture: NO GROWTH
Culture: NO GROWTH
Special Requests: ADEQUATE

## 2020-05-24 ENCOUNTER — Other Ambulatory Visit: Payer: Self-pay

## 2020-05-24 DIAGNOSIS — I739 Peripheral vascular disease, unspecified: Secondary | ICD-10-CM | POA: Diagnosis not present

## 2020-05-24 DIAGNOSIS — L89620 Pressure ulcer of left heel, unstageable: Secondary | ICD-10-CM | POA: Diagnosis not present

## 2020-05-24 DIAGNOSIS — J189 Pneumonia, unspecified organism: Secondary | ICD-10-CM | POA: Diagnosis not present

## 2020-05-24 NOTE — Progress Notes (Signed)
Surgical Instructions    Your procedure is scheduled on Friday, May 6th, 2022.  Report to [facility] Main Entrance "A" at 05:45 A.M., then check in with the Admitting office.  Call this number if you have problems the morning of surgery:  [phone]   If you have any questions prior to your surgery date call [phone]: Open Monday-Friday 8am-4pm    Remember:  Do not eat or drink after midnight the night before your surgery   Take oxyCODONE (OXY IR/ROXICODONE) the morning of surgery with A SIP OF WATER if needed.   WHAT DO I DO ABOUT MY DIABETES MEDICATION?   . THE BEDTIME BEFORE SURGERY, do not take insulin lispro (HUMALOG)    . THE MORNING OF SURGERY, do not take insulin lispro (HUMALOG)  If your CBG is greater than 220 mg/dL, you may take  of your sliding scale (correction) dose of insulin.  . THE BEDTIME BEFORE SURGERY, take 30 units insulin lispro (HUMALOG)   HOW TO MANAGE YOUR DIABETES BEFORE AND AFTER SURGERY  Why is it important to control my blood sugar before and after surgery? . Improving blood sugar levels before and after surgery helps healing and can limit problems. . A way of improving blood sugar control is eating a healthy diet by: o  Eating less sugar and carbohydrates o  Increasing activity/exercise o  Talking with your doctor about reaching your blood sugar goals . High blood sugars (greater than 180 mg/dL) can raise your risk of infections and slow your recovery, so you will need to focus on controlling your diabetes during the weeks before surgery. . Make sure that the doctor who takes care of your diabetes knows about your planned surgery including the date and location.  How do I manage my blood sugar before surgery? . Check your blood sugar at least 4 times a day, starting 2 days before surgery, to make sure that the level is not too high or low. . Check your blood sugar the morning of your surgery when you wake up and every 2 hours until you  get to the Short Stay unit. o If your blood sugar is less than 70 mg/dL, you will need to treat for low blood sugar: - Do not take insulin. - Treat a low blood sugar (less than 70 mg/dL) with  cup of clear juice (cranberry or apple), 4 glucose tablets, OR glucose gel. - Recheck blood sugar in 15 minutes after treatment (to make sure it is greater than 70 mg/dL). If your blood sugar is not greater than 70 mg/dL on recheck, call [phone] for further instructions. . Report your blood sugar to the short stay nurse when you get to Short Stay.  . If you are admitted to the hospital after surgery: o Your blood sugar will be checked by the staff and you will probably be given insulin after surgery (instead of oral diabetes medicines) to make sure you have good blood sugar levels. o The goal for blood sugar control after surgery is 80-180 mg/dL.  Follow your surgeon's instructions on when to stop Aspirin.  If no instructions were given by your surgeon then you will need to call the office to get those instructions.    As of today, STOP taking Aleve, Naproxen, Ibuprofen, Motrin, Advil, Goody's, BC's, all herbal medications, fish oil, and all vitamins.                     Do not wear jewelry.  Do not wear lotions, powders, colognes, or deodorant.            Men may shave face and neck.            Do not bring valuables to the hospital.            [facility] is not responsible for any belongings or valuables.  Do NOT Smoke (Tobacco/Vaping) or drink Alcohol 24 hours prior to your procedure If you use a CPAP at night, you may bring all equipment for your overnight stay.   Contacts, glasses, dentures or bridgework may not be worn into surgery, please bring cases for these belongings   For patients admitted to the hospital, discharge time will be determined by your treatment team.   Patients discharged the day of surgery will not be allowed to drive home, and someone needs to stay with  them for 24 hours.    Special instructions:   [place]- Preparing For Surgery  Before surgery, you can play an important role. Because skin is not sterile, your skin needs to be as free of germs as possible. You can reduce the number of germs on your skin by washing with CHG (chlorahexidine gluconate) Soap before surgery.  CHG is an antiseptic cleaner which kills germs and bonds with the skin to continue killing germs even after washing.    Oral Hygiene is also important to reduce your risk of infection.  Remember - BRUSH YOUR TEETH THE MORNING OF SURGERY WITH YOUR REGULAR TOOTHPASTE  Please do not use if you have an allergy to CHG or antibacterial soaps. If your skin becomes reddened/irritated stop using the CHG.  Do not shave (including legs and underarms) for at least 48 hours prior to first CHG shower. It is OK to shave your face.  Please follow these instructions carefully.   1. Shower the NIGHT BEFORE SURGERY and the MORNING OF SURGERY  2. If you chose to wash your hair, wash your hair first as usual with your normal shampoo.  3. After you shampoo, rinse your hair and body thoroughly to remove the shampoo.  4. Use CHG Soap as you would any other liquid soap. You can apply CHG directly to the skin and wash gently with a scrungie or a clean washcloth.   5. Apply the CHG Soap to your body ONLY FROM THE NECK DOWN.  Do not use on open wounds or open sores. Avoid contact with your eyes, ears, mouth and genitals (private parts). Wash Face and genitals (private parts)  with your normal soap.   6. Wash thoroughly, paying special attention to the area where your surgery will be performed.  7. Thoroughly rinse your body with warm water from the neck down.  8. DO NOT shower/wash with your normal soap after using and rinsing off the CHG Soap.  9. Pat yourself dry with a CLEAN TOWEL.  10. Wear CLEAN PAJAMAS to bed the night before surgery  11. Place CLEAN SHEETS on your bed the night  before your surgery  12. DO NOT SLEEP WITH PETS.   Day of Surgery: Take a shower with CHG soap. Wear Clean/Comfortable clothing the morning of surgery Do not apply any deodorants/lotions.   Remember to brush your teeth WITH YOUR REGULAR TOOTHPASTE.   Please read over the following fact sheets that you were given.

## 2020-05-25 ENCOUNTER — Other Ambulatory Visit: Payer: Self-pay

## 2020-05-25 ENCOUNTER — Encounter (HOSPITAL_COMMUNITY)
Admission: RE | Admit: 2020-05-25 | Discharge: 2020-05-25 | Disposition: A | Discharge status: Home / Self Care | Payer: Medicare Other | Source: Ambulatory Visit | Attending: Vascular Surgery | Admitting: Vascular Surgery

## 2020-05-25 ENCOUNTER — Encounter (HOSPITAL_COMMUNITY): Payer: Self-pay

## 2020-05-25 ENCOUNTER — Telehealth: Payer: Self-pay

## 2020-05-25 DIAGNOSIS — Z20822 Contact with and (suspected) exposure to covid-19: Secondary | ICD-10-CM | POA: Insufficient documentation

## 2020-05-25 DIAGNOSIS — Z01812 Encounter for preprocedural laboratory examination: Secondary | ICD-10-CM | POA: Insufficient documentation

## 2020-05-25 LAB — PROTIME-INR
INR: 1 (ref 0.8–1.2)
Prothrombin Time: 13.6 seconds (ref 11.4–15.2)

## 2020-05-25 LAB — CBC
HCT: 26.7 % — ABNORMAL LOW (ref 39.0–52.0)
Hemoglobin: 8.9 g/dL — ABNORMAL LOW (ref 13.0–17.0)
MCH: 30.9 pg (ref 26.0–34.0)
MCHC: 33.3 g/dL (ref 30.0–36.0)
MCV: 92.7 fL (ref 80.0–100.0)
Platelets: 383 10*3/uL (ref 150–400)
RBC: 2.88 MIL/uL — ABNORMAL LOW (ref 4.22–5.81)
RDW: 13.2 % (ref 11.5–15.5)
WBC: 9.5 10*3/uL (ref 4.0–10.5)
nRBC: 0 % (ref 0.0–0.2)

## 2020-05-25 LAB — URINALYSIS, ROUTINE W REFLEX MICROSCOPIC
Bilirubin Urine: NEGATIVE
Glucose, UA: >=500 mg/dL — AB
Ketones, ur: NEGATIVE mg/dL
Leukocytes,Ua: NEGATIVE
Nitrite: NEGATIVE
Protein, ur: 100 mg/dL — AB
Specific Gravity, Urine: 1.012 (ref 1.005–1.030)
pH: 5 (ref 5.0–8.0)

## 2020-05-25 LAB — COMPREHENSIVE METABOLIC PANEL
ALT: 15 U/L (ref 0–44)
AST: 21 U/L (ref 15–41)
Albumin: 3.7 g/dL (ref 3.5–5.0)
Alkaline Phosphatase: 108 U/L (ref 38–126)
Anion gap: 7 (ref 5–15)
BUN: 11 mg/dL (ref 8–23)
CO2: 27 mmol/L (ref 22–32)
Calcium: 9.6 mg/dL (ref 8.9–10.3)
Chloride: 98 mmol/L (ref 98–111)
Creatinine, Ser: 1.32 mg/dL — ABNORMAL HIGH (ref 0.61–1.24)
GFR, Estimated: 57 mL/min — ABNORMAL LOW (ref >60)
Glucose, Bld: 332 mg/dL — ABNORMAL HIGH (ref 70–99)
Potassium: 4.2 mmol/L (ref 3.5–5.1)
Sodium: 132 mmol/L — ABNORMAL LOW (ref 135–145)
Total Bilirubin: 0.4 mg/dL (ref 0.3–1.2)
Total Protein: 7.4 g/dL (ref 6.5–8.1)

## 2020-05-25 LAB — SURGICAL PCR SCREEN
MRSA, PCR: NEGATIVE
Staphylococcus aureus: NEGATIVE

## 2020-05-25 LAB — APTT: aPTT: 32 seconds (ref 24–36)

## 2020-05-25 LAB — SARS CORONAVIRUS 2 (TAT 6-24 HRS): SARS Coronavirus 2: NEGATIVE

## 2020-05-25 LAB — GLUCOSE, CAPILLARY: Glucose-Capillary: 313 mg/dL — ABNORMAL HIGH (ref 70–99)

## 2020-05-25 NOTE — Telephone Encounter (Signed)
Received VM from [place] at [facility], they had received orders to admit patient to their service and change the dressing on his foot. She stated on the VM that "patient became very verbally abusive and aggressive and saying 'that he didn't have to answer the same damn questions over and over again'". He was not admitted to their service and dressing is unchanged.

## 2020-05-25 NOTE — Progress Notes (Addendum)
PCP: [name], Md Cardiologist: Denies  EKG: 07/10/19 CXR: 05/18/20 ECHO: 06/30/15 Stress Test: denies Cardiac Cath: 05/20/20  Fasting Blood Sugar- Patient would only say 216 Checks Blood Sugar__unknown_ times a day  OSA/CPAP: No  ASA: Continue Blood Thinners: No  covid test 05/25/20 at PAT  Anesthesia Review:  Yes, recent cardiac cath 05/20/20 and BG 313 at PAT  Patient denies shortness of breath, fever, cough, and chest pain at PAT appointment.  Patient verbalized understanding of instructions provided today at the PAT appointment.  Patient asked to review instructions at home and day of surgery.

## 2020-05-25 NOTE — Progress Notes (Signed)
Spoke with Tama Headings, diabetes coordinator regarding BG 313.  She is aware of surgery time and date.

## 2020-05-26 ENCOUNTER — Other Ambulatory Visit (HOSPITAL_COMMUNITY): Payer: Medicare Other

## 2020-05-27 ENCOUNTER — Encounter (HOSPITAL_COMMUNITY)
Admission: RE | Disposition: A | Discharge status: Home / Self Care | Payer: Self-pay | Source: Home / Self Care | Attending: Internal Medicine

## 2020-05-27 ENCOUNTER — Other Ambulatory Visit: Payer: Self-pay

## 2020-05-27 ENCOUNTER — Inpatient Hospital Stay (HOSPITAL_COMMUNITY)
Admission: RE | Admit: 2020-05-27 | Discharge: 2020-05-30 | DRG: 252 | Disposition: A | Discharge status: Home / Self Care | Payer: Medicare Other | Attending: Internal Medicine | Admitting: Internal Medicine

## 2020-05-27 ENCOUNTER — Encounter (HOSPITAL_COMMUNITY): Payer: Self-pay | Admitting: Vascular Surgery

## 2020-05-27 ENCOUNTER — Inpatient Hospital Stay (HOSPITAL_COMMUNITY): Payer: Medicare Other | Admitting: Anesthesiology

## 2020-05-27 ENCOUNTER — Inpatient Hospital Stay (HOSPITAL_COMMUNITY): Payer: Medicare Other | Admitting: Physician Assistant

## 2020-05-27 DIAGNOSIS — Z20822 Contact with and (suspected) exposure to covid-19: Secondary | ICD-10-CM | POA: Diagnosis present

## 2020-05-27 DIAGNOSIS — L97429 Non-pressure chronic ulcer of left heel and midfoot with unspecified severity: Secondary | ICD-10-CM | POA: Diagnosis present

## 2020-05-27 DIAGNOSIS — I129 Hypertensive chronic kidney disease with stage 1 through stage 4 chronic kidney disease, or unspecified chronic kidney disease: Secondary | ICD-10-CM | POA: Diagnosis present

## 2020-05-27 DIAGNOSIS — Z7982 Long term (current) use of aspirin: Secondary | ICD-10-CM | POA: Diagnosis not present

## 2020-05-27 DIAGNOSIS — E1151 Type 2 diabetes mellitus with diabetic peripheral angiopathy without gangrene: Secondary | ICD-10-CM | POA: Diagnosis not present

## 2020-05-27 DIAGNOSIS — E1065 Type 1 diabetes mellitus with hyperglycemia: Secondary | ICD-10-CM | POA: Diagnosis present

## 2020-05-27 DIAGNOSIS — K219 Gastro-esophageal reflux disease without esophagitis: Secondary | ICD-10-CM | POA: Diagnosis present

## 2020-05-27 DIAGNOSIS — E11621 Type 2 diabetes mellitus with foot ulcer: Secondary | ICD-10-CM | POA: Diagnosis not present

## 2020-05-27 DIAGNOSIS — E43 Unspecified severe protein-calorie malnutrition: Secondary | ICD-10-CM | POA: Diagnosis present

## 2020-05-27 DIAGNOSIS — Z8611 Personal history of tuberculosis: Secondary | ICD-10-CM | POA: Diagnosis not present

## 2020-05-27 DIAGNOSIS — E1022 Type 1 diabetes mellitus with diabetic chronic kidney disease: Secondary | ICD-10-CM | POA: Diagnosis present

## 2020-05-27 DIAGNOSIS — F0390 Unspecified dementia without behavioral disturbance: Secondary | ICD-10-CM | POA: Diagnosis present

## 2020-05-27 DIAGNOSIS — E1040 Type 1 diabetes mellitus with diabetic neuropathy, unspecified: Secondary | ICD-10-CM | POA: Diagnosis present

## 2020-05-27 DIAGNOSIS — N1831 Chronic kidney disease, stage 3a: Secondary | ICD-10-CM | POA: Diagnosis present

## 2020-05-27 DIAGNOSIS — Z888 Allergy status to other drugs, medicaments and biological substances status: Secondary | ICD-10-CM

## 2020-05-27 DIAGNOSIS — Z794 Long term (current) use of insulin: Secondary | ICD-10-CM | POA: Diagnosis not present

## 2020-05-27 DIAGNOSIS — I70229 Atherosclerosis of native arteries of extremities with rest pain, unspecified extremity: Secondary | ICD-10-CM | POA: Diagnosis not present

## 2020-05-27 DIAGNOSIS — D62 Acute posthemorrhagic anemia: Secondary | ICD-10-CM | POA: Diagnosis not present

## 2020-05-27 DIAGNOSIS — E1051 Type 1 diabetes mellitus with diabetic peripheral angiopathy without gangrene: Principal | ICD-10-CM | POA: Diagnosis present

## 2020-05-27 DIAGNOSIS — E876 Hypokalemia: Secondary | ICD-10-CM | POA: Diagnosis present

## 2020-05-27 DIAGNOSIS — F17210 Nicotine dependence, cigarettes, uncomplicated: Secondary | ICD-10-CM | POA: Diagnosis present

## 2020-05-27 DIAGNOSIS — Z681 Body mass index (BMI) 19 or less, adult: Secondary | ICD-10-CM

## 2020-05-27 DIAGNOSIS — E10621 Type 1 diabetes mellitus with foot ulcer: Secondary | ICD-10-CM | POA: Diagnosis present

## 2020-05-27 DIAGNOSIS — I70244 Atherosclerosis of native arteries of left leg with ulceration of heel and midfoot: Secondary | ICD-10-CM | POA: Diagnosis present

## 2020-05-27 DIAGNOSIS — Z885 Allergy status to narcotic agent status: Secondary | ICD-10-CM

## 2020-05-27 DIAGNOSIS — Z79899 Other long term (current) drug therapy: Secondary | ICD-10-CM | POA: Diagnosis not present

## 2020-05-27 DIAGNOSIS — F4310 Post-traumatic stress disorder, unspecified: Secondary | ICD-10-CM | POA: Diagnosis not present

## 2020-05-27 HISTORY — PX: FEMORAL-TIBIAL BYPASS GRAFT: SHX938

## 2020-05-27 LAB — CBC
HCT: 28.1 % — ABNORMAL LOW (ref 39.0–52.0)
Hemoglobin: 9.8 g/dL — ABNORMAL LOW (ref 13.0–17.0)
MCH: 30.6 pg (ref 26.0–34.0)
MCHC: 34.9 g/dL (ref 30.0–36.0)
MCV: 87.8 fL (ref 80.0–100.0)
Platelets: 285 10*3/uL (ref 150–400)
RBC: 3.2 MIL/uL — ABNORMAL LOW (ref 4.22–5.81)
RDW: 13.4 % (ref 11.5–15.5)
WBC: 11.2 10*3/uL — ABNORMAL HIGH (ref 4.0–10.5)
nRBC: 0 % (ref 0.0–0.2)

## 2020-05-27 LAB — GLUCOSE, CAPILLARY
Glucose-Capillary: 353 mg/dL — ABNORMAL HIGH (ref 70–99)
Glucose-Capillary: 337 mg/dL — ABNORMAL HIGH (ref 70–99)
Glucose-Capillary: 95 mg/dL (ref 70–99)
Glucose-Capillary: 184 mg/dL — ABNORMAL HIGH (ref 70–99)
Glucose-Capillary: 107 mg/dL — ABNORMAL HIGH (ref 70–99)

## 2020-05-27 LAB — CREATININE, SERUM
Creatinine, Ser: 1.17 mg/dL (ref 0.61–1.24)
GFR, Estimated: >60 mL/min (ref >60)

## 2020-05-27 LAB — HEMOGLOBIN AND HEMATOCRIT, BLOOD
HCT: 18.9 % — ABNORMAL LOW (ref 39.0–52.0)
Hemoglobin: 6.4 g/dL — CL (ref 13.0–17.0)

## 2020-05-27 LAB — PREPARE RBC (CROSSMATCH)

## 2020-05-27 SURGERY — CREATION, BYPASS, ARTERIAL, FEMORAL TO TIBIAL, USING GRAFT
Anesthesia: General | Laterality: Left

## 2020-05-27 MED ORDER — ONDANSETRON HCL 4 MG/2ML IJ SOLN
4.0000 mg | Freq: Four times a day (QID) | INTRAMUSCULAR | Status: DC | PRN
  Administered 2020-05-27: 4 mg via INTRAVENOUS
  Filled 2020-05-27: qty 2

## 2020-05-27 MED ORDER — HEPARIN SODIUM (PORCINE) 5000 UNIT/ML IJ SOLN
5000.0000 [IU] | Freq: Three times a day (TID) | INTRAMUSCULAR | Status: DC
  Administered 2020-05-28 – 2020-05-30 (×7): 5000 [IU] via SUBCUTANEOUS
  Filled 2020-05-27 (×7): qty 1

## 2020-05-27 MED ORDER — PROTAMINE SULFATE 10 MG/ML IV SOLN
INTRAVENOUS | Status: DC | PRN
  Administered 2020-05-27: 50 mg via INTRAVENOUS

## 2020-05-27 MED ORDER — PHENYLEPHRINE 40 MCG/ML (10ML) SYRINGE FOR IV PUSH (FOR BLOOD PRESSURE SUPPORT)
PREFILLED_SYRINGE | INTRAVENOUS | Status: AC
  Filled 2020-05-27: qty 20

## 2020-05-27 MED ORDER — ORAL CARE MOUTH RINSE: 15.0000 mL | Freq: Once | OROMUCOSAL | Status: AC

## 2020-05-27 MED ORDER — CHLORHEXIDINE GLUCONATE 0.12 % MT SOLN
OROMUCOSAL | Status: AC
  Administered 2020-05-27: 15 mL via OROMUCOSAL
  Filled 2020-05-27: qty 15

## 2020-05-27 MED ORDER — FENTANYL CITRATE (PF) 250 MCG/5ML IJ SOLN
INTRAMUSCULAR | Status: AC
  Filled 2020-05-27: qty 5

## 2020-05-27 MED ORDER — INSULIN REGULAR(HUMAN) IN NACL 100-0.9 UT/100ML-% IV SOLN
INTRAVENOUS | Status: DC
  Filled 2020-05-27: qty 100

## 2020-05-27 MED ORDER — LIDOCAINE 2% (20 MG/ML) 5 ML SYRINGE
INTRAMUSCULAR | Status: DC | PRN
  Administered 2020-05-27: 20 mg via INTRAVENOUS

## 2020-05-27 MED ORDER — HYDROMORPHONE HCL 1 MG/ML IJ SOLN: 0.5000 mg | INTRAMUSCULAR | Status: DC | PRN

## 2020-05-27 MED ORDER — SODIUM CHLORIDE 0.9% IV SOLUTION: Freq: Once | INTRAVENOUS | Status: DC

## 2020-05-27 MED ORDER — INSULIN ASPART 100 UNIT/ML IJ SOLN
0.0000 [IU] | Freq: Three times a day (TID) | INTRAMUSCULAR | Status: DC
  Administered 2020-05-27 – 2020-05-29 (×2): 3 [IU] via SUBCUTANEOUS

## 2020-05-27 MED ORDER — INSULIN REGULAR(HUMAN) IN NACL 100-0.9 UT/100ML-% IV SOLN
INTRAVENOUS | Status: DC | PRN
  Administered 2020-05-27: 7 [IU]/h via INTRAVENOUS

## 2020-05-27 MED ORDER — SODIUM CHLORIDE 0.9 % IV SOLN: INTRAVENOUS | Status: DC

## 2020-05-27 MED ORDER — FENTANYL CITRATE (PF) 250 MCG/5ML IJ SOLN
INTRAMUSCULAR | Status: DC | PRN
  Administered 2020-05-27: 50 ug via INTRAVENOUS
  Administered 2020-05-27: 100 ug via INTRAVENOUS
  Administered 2020-05-27 (×2): 50 ug via INTRAVENOUS

## 2020-05-27 MED ORDER — POTASSIUM CHLORIDE CRYS ER 20 MEQ PO TBCR
20.0000 meq | EXTENDED_RELEASE_TABLET | Freq: Every day | ORAL | Status: AC | PRN
  Administered 2020-05-29: 20 meq via ORAL
  Filled 2020-05-27: qty 1

## 2020-05-27 MED ORDER — INSULIN GLARGINE 100 UNIT/ML ~~LOC~~ SOLN
20.0000 [IU] | Freq: Two times a day (BID) | SUBCUTANEOUS | Status: DC
  Filled 2020-05-27 (×2): qty 0.2

## 2020-05-27 MED ORDER — INSULIN REGULAR(HUMAN) IN NACL 100-0.9 UT/100ML-% IV SOLN
INTRAVENOUS | Status: DC
  Administered 2020-05-27: 7 [IU]/h via INTRAVENOUS
  Filled 2020-05-27: qty 100

## 2020-05-27 MED ORDER — LACTATED RINGERS IV SOLN: INTRAVENOUS | Status: DC

## 2020-05-27 MED ORDER — POLYETHYLENE GLYCOL 3350 17 G PO PACK: 17.0000 g | PACK | Freq: Every day | ORAL | Status: DC | PRN

## 2020-05-27 MED ORDER — DONEPEZIL HCL 5 MG PO TABS
10.0000 mg | ORAL_TABLET | Freq: Every day | ORAL | Status: DC
  Administered 2020-05-27 – 2020-05-29 (×3): 10 mg via ORAL
  Filled 2020-05-27 (×3): qty 2

## 2020-05-27 MED ORDER — GUAIFENESIN-DM 100-10 MG/5ML PO SYRP: 15.0000 mL | ORAL_SOLUTION | ORAL | Status: DC | PRN

## 2020-05-27 MED ORDER — ASPIRIN EC 81 MG PO TBEC
81.0000 mg | DELAYED_RELEASE_TABLET | Freq: Every day | ORAL | Status: DC
  Administered 2020-05-27 – 2020-05-29 (×3): 81 mg via ORAL
  Filled 2020-05-27 (×3): qty 1

## 2020-05-27 MED ORDER — PANTOPRAZOLE SODIUM 40 MG PO TBEC
40.0000 mg | DELAYED_RELEASE_TABLET | Freq: Every day | ORAL | Status: DC
  Administered 2020-05-28 – 2020-05-30 (×3): 40 mg via ORAL
  Filled 2020-05-27 (×3): qty 1

## 2020-05-27 MED ORDER — PHENYLEPHRINE 40 MCG/ML (10ML) SYRINGE FOR IV PUSH (FOR BLOOD PRESSURE SUPPORT)
PREFILLED_SYRINGE | INTRAVENOUS | Status: DC | PRN
  Administered 2020-05-27 (×7): 80 ug via INTRAVENOUS

## 2020-05-27 MED ORDER — LIVING WELL WITH DIABETES BOOK
Freq: Once | Status: DC
  Filled 2020-05-27: qty 1

## 2020-05-27 MED ORDER — SODIUM CHLORIDE 0.9 % IV SOLN
INTRAVENOUS | Status: AC
  Filled 2020-05-27: qty 1.2

## 2020-05-27 MED ORDER — DEXTROSE IN LACTATED RINGERS 5 % IV SOLN: INTRAVENOUS | Status: DC

## 2020-05-27 MED ORDER — CEFAZOLIN SODIUM-DEXTROSE 2-4 GM/100ML-% IV SOLN
INTRAVENOUS | Status: AC
  Filled 2020-05-27: qty 100

## 2020-05-27 MED ORDER — MAGNESIUM SULFATE 2 GM/50ML IV SOLN: 2.0000 g | Freq: Every day | INTRAVENOUS | Status: DC | PRN

## 2020-05-27 MED ORDER — ALBUMIN HUMAN 5 % IV SOLN: INTRAVENOUS | Status: DC | PRN

## 2020-05-27 MED ORDER — CEFAZOLIN SODIUM-DEXTROSE 2-4 GM/100ML-% IV SOLN
2.0000 g | INTRAVENOUS | Status: AC
  Administered 2020-05-27: 2 g via INTRAVENOUS

## 2020-05-27 MED ORDER — HYDRALAZINE HCL 20 MG/ML IJ SOLN: 5.0000 mg | INTRAMUSCULAR | Status: DC | PRN

## 2020-05-27 MED ORDER — ROCURONIUM BROMIDE 10 MG/ML (PF) SYRINGE
PREFILLED_SYRINGE | INTRAVENOUS | Status: AC
  Filled 2020-05-27: qty 10

## 2020-05-27 MED ORDER — LOSARTAN POTASSIUM 50 MG PO TABS
50.0000 mg | ORAL_TABLET | Freq: Every day | ORAL | Status: DC
  Administered 2020-05-27 – 2020-05-29 (×3): 50 mg via ORAL
  Filled 2020-05-27 (×3): qty 1

## 2020-05-27 MED ORDER — ONDANSETRON HCL 4 MG/2ML IJ SOLN
INTRAMUSCULAR | Status: AC
  Filled 2020-05-27: qty 2

## 2020-05-27 MED ORDER — INSULIN GLARGINE 100 UNIT/ML ~~LOC~~ SOLN
20.0000 [IU] | Freq: Two times a day (BID) | SUBCUTANEOUS | Status: DC
  Administered 2020-05-27: 20 [IU] via SUBCUTANEOUS
  Filled 2020-05-27 (×3): qty 0.2

## 2020-05-27 MED ORDER — ONDANSETRON HCL 4 MG/2ML IJ SOLN
INTRAMUSCULAR | Status: DC | PRN
  Administered 2020-05-27: 4 mg via INTRAVENOUS

## 2020-05-27 MED ORDER — LABETALOL HCL 5 MG/ML IV SOLN
10.0000 mg | INTRAVENOUS | Status: DC | PRN
  Administered 2020-05-29: 10 mg via INTRAVENOUS
  Filled 2020-05-27: qty 4

## 2020-05-27 MED ORDER — SODIUM CHLORIDE 0.9 % IV SOLN: 10.0000 mL/h | Freq: Once | INTRAVENOUS | Status: DC

## 2020-05-27 MED ORDER — PHENYLEPHRINE HCL-NACL 10-0.9 MG/250ML-% IV SOLN
INTRAVENOUS | Status: DC | PRN
  Administered 2020-05-27: 35 ug/min via INTRAVENOUS

## 2020-05-27 MED ORDER — CHLORHEXIDINE GLUCONATE 0.12 % MT SOLN: 15.0000 mL | Freq: Once | OROMUCOSAL | Status: AC

## 2020-05-27 MED ORDER — ATORVASTATIN CALCIUM 40 MG PO TABS
40.0000 mg | ORAL_TABLET | Freq: Every day | ORAL | Status: DC
  Administered 2020-05-27 – 2020-05-29 (×3): 40 mg via ORAL
  Filled 2020-05-27 (×3): qty 1

## 2020-05-27 MED ORDER — GABAPENTIN 100 MG PO CAPS
100.0000 mg | ORAL_CAPSULE | Freq: Every day | ORAL | Status: DC
  Administered 2020-05-27 – 2020-05-29 (×3): 100 mg via ORAL
  Filled 2020-05-27 (×3): qty 1

## 2020-05-27 MED ORDER — CEFAZOLIN SODIUM-DEXTROSE 2-4 GM/100ML-% IV SOLN
2.0000 g | Freq: Three times a day (TID) | INTRAVENOUS | Status: AC
  Administered 2020-05-27 – 2020-05-28 (×2): 2 g via INTRAVENOUS
  Filled 2020-05-27 (×2): qty 100

## 2020-05-27 MED ORDER — CHLORHEXIDINE GLUCONATE CLOTH 2 % EX PADS: 6.0000 | MEDICATED_PAD | Freq: Once | CUTANEOUS | Status: DC

## 2020-05-27 MED ORDER — DEXTROSE 50 % IV SOLN
0.0000 mL | INTRAVENOUS | Status: DC | PRN
Start: 2020-05-27 — End: 2020-05-27
  Filled 2020-05-27: qty 50

## 2020-05-27 MED ORDER — PHENOL 1.4 % MT LIQD
1.0000 | OROMUCOSAL | Status: DC | PRN
  Administered 2020-05-29: 1 via OROMUCOSAL

## 2020-05-27 MED ORDER — LIDOCAINE 2% (20 MG/ML) 5 ML SYRINGE
INTRAMUSCULAR | Status: AC
  Filled 2020-05-27: qty 5

## 2020-05-27 MED ORDER — PROPOFOL 10 MG/ML IV BOLUS
INTRAVENOUS | Status: DC | PRN
  Administered 2020-05-27: 120 mg via INTRAVENOUS

## 2020-05-27 MED ORDER — CHLORHEXIDINE GLUCONATE CLOTH 2 % EX PADS
6.0000 | MEDICATED_PAD | Freq: Every day | CUTANEOUS | Status: DC
  Administered 2020-05-28 – 2020-05-29 (×2): 6 via TOPICAL

## 2020-05-27 MED ORDER — ALUM & MAG HYDROXIDE-SIMETH 200-200-20 MG/5ML PO SUSP: 15.0000 mL | ORAL | Status: DC | PRN

## 2020-05-27 MED ORDER — ACETAMINOPHEN 325 MG PO TABS
325.0000 mg | ORAL_TABLET | ORAL | Status: DC | PRN
Start: 2020-05-27 — End: 2020-05-30

## 2020-05-27 MED ORDER — DEXTROSE 50 % IV SOLN
0.0000 mL | INTRAVENOUS | Status: DC | PRN
  Filled 2020-05-27: qty 50

## 2020-05-27 MED ORDER — INSULIN ASPART 100 UNIT/ML IJ SOLN
0.0000 [IU] | Freq: Every day | INTRAMUSCULAR | Status: DC
  Administered 2020-05-29: 2 [IU] via SUBCUTANEOUS

## 2020-05-27 MED ORDER — MIDAZOLAM HCL 2 MG/2ML IJ SOLN
INTRAMUSCULAR | Status: AC
  Filled 2020-05-27: qty 2

## 2020-05-27 MED ORDER — BISACODYL 10 MG RE SUPP: 10.0000 mg | Freq: Every day | RECTAL | Status: DC | PRN

## 2020-05-27 MED ORDER — SODIUM CHLORIDE 0.9 % IV SOLN: 500.0000 mL | Freq: Once | INTRAVENOUS | Status: DC | PRN

## 2020-05-27 MED ORDER — SODIUM CHLORIDE 0.9 % IV SOLN: INTRAVENOUS | Status: DC | PRN

## 2020-05-27 MED ORDER — DOCUSATE SODIUM 100 MG PO CAPS
100.0000 mg | ORAL_CAPSULE | Freq: Every day | ORAL | Status: DC
  Administered 2020-05-28 – 2020-05-30 (×3): 100 mg via ORAL
  Filled 2020-05-27 (×3): qty 1

## 2020-05-27 MED ORDER — INSULIN ASPART 100 UNIT/ML IJ SOLN
10.0000 [IU] | Freq: Once | INTRAMUSCULAR | Status: DC
  Filled 2020-05-27: qty 0.1

## 2020-05-27 MED ORDER — ACETAMINOPHEN 650 MG RE SUPP: 325.0000 mg | RECTAL | Status: DC | PRN

## 2020-05-27 MED ORDER — HEPARIN SODIUM (PORCINE) 1000 UNIT/ML IJ SOLN
INTRAMUSCULAR | Status: AC
  Filled 2020-05-27: qty 1

## 2020-05-27 MED ORDER — PROPOFOL 10 MG/ML IV BOLUS
INTRAVENOUS | Status: AC
  Filled 2020-05-27: qty 20

## 2020-05-27 MED ORDER — OXYCODONE-ACETAMINOPHEN 5-325 MG PO TABS
1.0000 | ORAL_TABLET | ORAL | Status: DC | PRN
  Administered 2020-05-27 – 2020-05-29 (×6): 2 via ORAL
  Filled 2020-05-27 (×6): qty 2

## 2020-05-27 MED ORDER — METOPROLOL TARTRATE 5 MG/5ML IV SOLN: 2.0000 mg | INTRAVENOUS | Status: DC | PRN

## 2020-05-27 MED ORDER — HEPARIN SODIUM (PORCINE) 1000 UNIT/ML IJ SOLN
INTRAMUSCULAR | Status: DC | PRN
  Administered 2020-05-27: 6000 [IU] via INTRAVENOUS

## 2020-05-27 MED ORDER — ROCURONIUM BROMIDE 10 MG/ML (PF) SYRINGE
PREFILLED_SYRINGE | INTRAVENOUS | Status: DC | PRN
  Administered 2020-05-27: 50 mg via INTRAVENOUS

## 2020-05-27 MED ORDER — 0.9 % SODIUM CHLORIDE (POUR BTL) OPTIME
TOPICAL | Status: DC | PRN
  Administered 2020-05-27: 2000 mL

## 2020-05-27 MED ORDER — SODIUM CHLORIDE 0.9 % IV SOLN
INTRAVENOUS | Status: DC | PRN
  Administered 2020-05-27: 500 mL

## 2020-05-27 MED ORDER — SUGAMMADEX SODIUM 200 MG/2ML IV SOLN
INTRAVENOUS | Status: DC | PRN
  Administered 2020-05-27: 100 mg via INTRAVENOUS

## 2020-05-27 SURGICAL SUPPLY — 34 items
ADH SKN CLS APL DERMABOND .7 (GAUZE/BANDAGES/DRESSINGS) ×5
CANISTER SUCT 3000ML PPV (MISCELLANEOUS) ×2 IMPLANT
CANNULA VESSEL 3MM 2 BLNT TIP (CANNULA) ×4 IMPLANT
CLIP LIGATING EXTRA MED SLVR (CLIP) ×2 IMPLANT
CLIP LIGATING EXTRA SM BLUE (MISCELLANEOUS) ×2 IMPLANT
COVER PROBE W GEL 5X96 (DRAPES) ×1 IMPLANT
COVER WAND RF STERILE (DRAPES) ×2 IMPLANT
DERMABOND ADVANCED (GAUZE/BANDAGES/DRESSINGS) ×5
DERMABOND ADVANCED .7 DNX12 (GAUZE/BANDAGES/DRESSINGS) ×1 IMPLANT
ELECT REM PT RETURN 9FT ADLT (ELECTROSURGICAL) ×2
ELECTRODE REM PT RTRN 9FT ADLT (ELECTROSURGICAL) ×1 IMPLANT
GLOVE SS BIOGEL STRL SZ 7.5 (GLOVE) ×1 IMPLANT
GLOVE SUPERSENSE BIOGEL SZ 7.5 (GLOVE) ×1
GOWN STRL REUS W/ TWL LRG LVL3 (GOWN DISPOSABLE) ×3 IMPLANT
GOWN STRL REUS W/TWL LRG LVL3 (GOWN DISPOSABLE) ×6
GRAFT PROPATEN W/RING 6X80X60 (Vascular Products) ×1 IMPLANT
KIT BASIN OR (CUSTOM PROCEDURE TRAY) ×2 IMPLANT
KIT TURNOVER KIT B (KITS) ×2 IMPLANT
NS IRRIG 1000ML POUR BTL (IV SOLUTION) ×4 IMPLANT
PACK PERIPHERAL VASCULAR (CUSTOM PROCEDURE TRAY) ×2 IMPLANT
PAD ARMBOARD 7.5X6 YLW CONV (MISCELLANEOUS) ×4 IMPLANT
SUT MNCRL AB 4-0 PS2 18 (SUTURE) ×2 IMPLANT
SUT PROLENE 5 0 C 1 24 (SUTURE) ×2 IMPLANT
SUT PROLENE 6 0 CC (SUTURE) ×3 IMPLANT
SUT SILK 2 0 SH (SUTURE) ×2 IMPLANT
SUT SILK 3 0 (SUTURE) ×2
SUT SILK 3-0 18XBRD TIE 12 (SUTURE) IMPLANT
SUT VIC AB 2-0 CTX 36 (SUTURE) ×4 IMPLANT
SUT VIC AB 3-0 SH 27 (SUTURE) ×6
SUT VIC AB 3-0 SH 27X BRD (SUTURE) ×2 IMPLANT
TOWEL GREEN STERILE (TOWEL DISPOSABLE) ×2 IMPLANT
TRAY FOLEY MTR SLVR 16FR STAT (SET/KITS/TRAYS/PACK) ×2 IMPLANT
UNDERPAD 30X36 HEAVY ABSORB (UNDERPADS AND DIAPERS) ×2 IMPLANT
WATER STERILE IRR 1000ML POUR (IV SOLUTION) ×2 IMPLANT

## 2020-05-27 NOTE — Interval H&P Note (Signed)
History and Physical Interval Note:  05/27/2020 7:00 AM  Allen Yu  has presented today for surgery, with the diagnosis of PVD with left heel ulcer.  The various methods of treatment have been discussed with the patient and family. After consideration of risks, benefits and other options for treatment, the patient has consented to  Procedure(s): LEFT FEMORAL TO PERONEAL ARTERY BYPASS (Left) as a surgical intervention.  The patient's history has been reviewed, patient examined, no change in status, stable for surgery.  I have reviewed the patient's chart and labs.  Questions were answered to the patient's satisfaction.     Curt Jews

## 2020-05-27 NOTE — Progress Notes (Signed)
  Day of Surgery Note    Subjective:  No complaints   Vitals:   05/27/20 1220 05/27/20 1235  BP: (!) 135/93 139/64  Pulse: 85 88  Resp: 14 14  Temp:  (!) 97.1 F (36.2 C)  SpO2: 94% 95%    Incisions:   All incisions look fine Extremities:  +doppler signals left DP/peroneal  Cardiac:  regular Lungs:  Non labored    Assessment/Plan:  This is a 75 y.o. male who is s/p  Left femoral to above-knee popliteal bypass with 6 mm Gore-Tex graft  -pt with +doppler signals left DP/peroneal and incisions look good -incisions look fine -appreciate [facility] admitting pt and managing medical issues -prevalon boot ordered for pt's left heel wound -pt to [place], PA-C 05/27/2020 12:54 PM [phone]

## 2020-05-27 NOTE — Anesthesia Procedure Notes (Signed)
Procedure Name: Intubation Date/Time: 05/27/2020 7:43 AM Performed by: Betha Loa, CRNA Pre-anesthesia Checklist: Patient identified, Emergency Drugs available, Suction available and Patient being monitored Patient Re-evaluated:Patient Re-evaluated prior to induction Oxygen Delivery Method: Circle System Utilized Preoxygenation: Pre-oxygenation with 100% oxygen Induction Type: IV induction Ventilation: Mask ventilation without difficulty Laryngoscope Size: Mac and 4 Grade View: Grade I Tube type: Oral Number of attempts: 1 Airway Equipment and Method: Stylet and Oral airway Placement Confirmation: ETT inserted through vocal cords under direct vision,  positive ETCO2 and breath sounds checked- equal and bilateral Secured at: 23 cm Tube secured with: Tape Dental Injury: Teeth and Oropharynx as per pre-operative assessment

## 2020-05-27 NOTE — Anesthesia Preprocedure Evaluation (Addendum)
Anesthesia Evaluation  Patient identified by MRN, date of birth, ID band Patient awake    Reviewed: Allergy & Precautions, NPO status , Patient's Chart, lab work & pertinent test results  Airway Mallampati: II  TM Distance: >3 FB Neck ROM: Full    Dental  (+) Teeth Intact, Dental Advisory Given   Pulmonary Current Smoker and Patient abstained from smoking.,    breath sounds clear to auscultation       Cardiovascular hypertension,  Rhythm:Regular Rate:Normal     Neuro/Psych    GI/Hepatic   Endo/Other  diabetes  Renal/GU      Musculoskeletal   Abdominal   Peds  Hematology   Anesthesia Other Findings   Reproductive/Obstetrics                             Anesthesia Physical Anesthesia Plan  ASA: III  Anesthesia Plan: General   Post-op Pain Management:    Induction: Intravenous  PONV Risk Score and Plan: Ondansetron  Airway Management Planned: Oral ETT  Additional Equipment: Arterial line  Intra-op Plan:   Post-operative Plan: Extubation in OR  Informed Consent: I have reviewed the patients History and Physical, chart, labs and discussed the procedure including the risks, benefits and alternatives for the proposed anesthesia with the patient or authorized representative who has indicated his/her understanding and acceptance.     Dental advisory given  Plan Discussed with: CRNA and Anesthesiologist  Anesthesia Plan Comments:         Anesthesia Quick Evaluation

## 2020-05-27 NOTE — Progress Notes (Signed)
CBG on arrival to short stay 353. Per Dr. Linna Caprice, start ENDOTOOL. Pt asymptomatic at this time.  Jacqlyn Larsen, RN

## 2020-05-27 NOTE — Op Note (Signed)
OPERATIVE REPORT  DATE OF SURGERY: 05/27/2020  PATIENT: Allen Yu, 75 y.o. male MRN: [id number]  DOB: 10/27/1945  PRE-OPERATIVE DIAGNOSIS: Critical limb ischemia left leg with left heel ulcer  POST-OPERATIVE DIAGNOSIS:  Same  PROCEDURE: Left femoral to above-knee popliteal bypass with 6 mm Gore-Tex graft  SURGEON:  Curt Jews, M.D.  PHYSICIAN ASSISTANT: Rhyne PAC  The assistant was needed for exposure and to expedite the case  ANESTHESIA: General  EBL: per anesthesia record  Total I/O In: 2080 [I.V.:1100; Blood:630; IV Piggyback:350] Out: 550 [Urine:500; Blood:50]  BLOOD ADMINISTERED: none  DRAINS: none  SPECIMEN: none  COUNTS CORRECT:  YES  PATIENT DISPOSITION:  PACU - hemodynamically stable  PROCEDURE DETAILS: The patient was taken the operating placed supine position where the area of the left groin left leg prepped draped in sterile fashion.  SonoSite ultrasound was used to visualize the saphenous vein which was patent but relatively small throughout its course.  Preoperative vein mapping and showed marginal saphenous vein.  Vision was made over the common femoral artery and the common, superficial femoral and profundus femoris arteries were encircled with Vesseloops.  There was an excellent pulse at this level.  The saphenous vein was exposed at the saphenofemoral junction.  Tributary branches were ligated with 3-0 silk ties and divided.  The vein was mobilized and using skip incisions was exposed from the groin to the distal calf.  The vein was patent but was of questionable size.  The peroneal artery was exposed through the distal medial incision and was thickened but also appear to be patent.  The vein was ligated distally and divided.  The vein was gently dilated and was extremely small caliber.  The vein was resected further proximally and continued to be very small.  I did not dilate.  There was not adequate length even for popliteal bypass.  Patient did have  a reconstitution of the above-knee popliteal with some irregularity in the popliteal space itself.  He had a short segment occlusion of the peroneal artery and then peroneal runoff.  I made the decision to do a left femoral to above-knee popliteal bypass with Gore-Tex since he had no usable vein.  This would essentially leave him with runoff via the popliteal with peroneal runoff with short segment occlusion.  The above-knee popliteal artery was exposed to the vein harvest incision and was calcified but of good caliber.  There was no pulse in the popliteal artery.  A tunnel was created from the above-knee popliteal artery to the groin.  The patient was given 6000 units of intravenous heparin after adequate circulation time the common superficial femoral and profundus femoris arteries were occluded.  The common femoral artery was opened with an 11 blade and sent longitudinally with Potts scissors.  Gore-Tex graft was brought onto the field and was spatulated and sewn end-to-side to the common femoral artery.  This was a 6 mm propatent ringed graft.  The anastomosis was tested and found to be adequate.  The vein was then brought down to the level of the above-knee popliteal artery.  The graft was flushed with heparinized saline and reoccluded.  The popliteal artery was occluded proximally distally and was opened with an 11 blade simultaneous Potts scissors.  The graft was cut to the appropriate length and was spatulated and sewn end-to-side to the artery with a running 6-0 Prolene suture.  Clamps removed and graft dependent Doppler flow was noted in the popliteal artery and at the peroneal level.  The patient was given 50 mg of protamine to reverse heparin.  Wounds irrigated with saline hemostasis attained after cautery.  The wounds were closed with 3-0 Vicryl in the subcutaneous and subcuticular tissue.  Sterile dressing was applied and the patient was transferred to the recovery room in stable condition   Rosetta Posner, M.D., [facility] 05/27/2020 11:37 AM  Note: Portions of this report may have been transcribed using voice recognition software.  Every effort has been made to ensure accuracy; however, inadvertent computerized transcription errors may still be present.

## 2020-05-27 NOTE — Transfer of Care (Signed)
Immediate Anesthesia Transfer of Care Note  Patient: Allen Yu  Procedure(s) Performed: LEFT FEMORAL TO PERONEAL ARTERY BYPASS (Left )  Patient Location: PACU  Anesthesia Type:General  Level of Consciousness: awake, patient cooperative and responds to stimulation  Airway & Oxygen Therapy: Patient Spontanous Breathing and Patient connected to face mask oxygen  Post-op Assessment: Report given to RN, Post -op Vital signs reviewed and stable and Patient moving all extremities X 4  Post vital signs: Reviewed and stable  Last Vitals:  Vitals Value Taken Time  BP 150/111 05/27/20 1150  Temp    Pulse 59 05/27/20 1154  Resp 25 05/27/20 1154  SpO2 96 % 05/27/20 1154  Vitals shown include unvalidated device data.  Last Pain:  Vitals:   05/27/20 0622  TempSrc:   PainSc: 10-Worst pain ever      Patients Stated Pain Goal: 5 (15/05/69 7948)  Complications: No complications documented.

## 2020-05-27 NOTE — Anesthesia Procedure Notes (Signed)
Arterial Line Insertion Start/End5/06/2020 7:20 AM, 05/27/2020 7:25 AM Performed by: Roderic Palau, MD, anesthesiologist  Patient location: Pre-op. Preanesthetic checklist: patient identified, IV checked, site marked, risks and benefits discussed, surgical consent, monitors and equipment checked, pre-op evaluation, timeout performed and anesthesia consent Lidocaine 1% used for infiltration Right, brachial was placed Catheter size: 20 Fr Hand hygiene performed , maximum sterile barriers used  and Seldinger technique used  Attempts: 1 Procedure performed using ultrasound guided technique. Ultrasound Notes:anatomy identified, needle tip was noted to be adjacent to the nerve/plexus identified, no ultrasound evidence of intravascular and/or intraneural injection and image(s) printed for medical record Following insertion, dressing applied, Biopatch and line sutured. Post procedure assessment: normal and unchanged  Patient tolerated the procedure well with no immediate complications.

## 2020-05-27 NOTE — Anesthesia Postprocedure Evaluation (Signed)
Anesthesia Post Note  Patient: HAIRO GARRAWAY  Procedure(s) Performed: LEFT FEMORAL TO PERONEAL ARTERY BYPASS (Left )     Patient location during evaluation: PACU Anesthesia Type: General Level of consciousness: awake and alert Pain management: pain level controlled Vital Signs Assessment: post-procedure vital signs reviewed and stable Respiratory status: spontaneous breathing, nonlabored ventilation, respiratory function stable and patient connected to nasal cannula oxygen Cardiovascular status: blood pressure returned to baseline and stable Postop Assessment: no apparent nausea or vomiting Anesthetic complications: no   No complications documented.  Last Vitals:  Vitals:   05/27/20 2000 05/27/20 2034  BP: 125/65 115/65  Pulse: 94 88  Resp: 15 18  Temp: 37.2 C 37.1 C  SpO2: 97% 96%    Last Pain:  Vitals:   05/27/20 2034  TempSrc: Oral  PainSc:                  Rashi Giuliani COKER

## 2020-05-27 NOTE — H&P (Signed)
History and Physical    Allen Yu XBJ:[id number] DOB: 09/28/1945 DOA: 05/27/2020  PCP: [name], MD   Patient coming from: Home  I have personally briefly reviewed patient's old medical records in [place]  Chief Complaint: Presented for vascular surgery.  HPI: Allen Yu is a 75 y.o. male with medical history significant of tuberculosis, PTSD, essential hypertension, GERD, type 1 diabetes mellitus, dementia, CKD who presents today to have left femoral above-knee popliteal bypass done with vascular surgery. After procedure vascular surgery requested [facility] for admission to manage his other chronic comorbidities.  Patient was recently admitted at [facility] from 4/26 till 05/21/2020, when he he was presented with left foot pain and then ulcer of left heel.  At that time Left foot x-ray with diffuse severe osteopenia and degenerative change, questional erosion medial aspect left distal second and third metatarsals, soft tissue wound over the heel with no adjacent bony erosion and no radiopaque foreign body. CT angiogram aortobifemoral with embolic contrast with extensive atherosclerotic plaque without high-grade occlusion or stenosis and bilateral internal iliac artery high-grade stenosis, left/right SFA stenosis. Vascular surgery was consulted, and patient underwent abdominal aortogram with bilateral lower extremity runoff by vascular surgery, Dr. Oneida Alar on 05/20/2020 with findings of long segment occlusion of the left superficial femoral artery and diffusely diseased popliteal artery.  Vascular surgery planning to do left femoral to peroneal bypass has today and patient presented today for that procedure.  During prior hospitalization he was recommended going to SNF which he declined and went home with home health services and wheelchair.  Patient was seen after the procedure.  Very hard of hearing.  Continues to have left foot pain, denies any chest pain or shortness of breath.   Denies any upper respiratory or urinary symptoms.  Review of Systems: As per HPI otherwise 10 point review of systems negative.   Past Medical History:  Diagnosis Date  . Anemia   . Arthritis   . Carotid stenosis   . Chronic back pain   . Chronic kidney disease    stones hx  . Dementia ([place])   . Diabetes mellitus   . GERD (gastroesophageal reflux disease)   . Hypertension   . Lung nodule   . Peripheral neuropathy   . PTSD (post-traumatic stress disorder)   . Schizophrenia ([place])   . Tuberculosis    MED TX X 1 YEAR YEARS AGO     Past Surgical History:  Procedure Laterality Date  . ABDOMINAL AORTOGRAM W/LOWER EXTREMITY N/A 05/20/2020   Procedure: ABDOMINAL AORTOGRAM W/LOWER EXTREMITY;  Surgeon: Elam Dutch, MD;  Location: [place] CV LAB;  Service: Cardiovascular;  Laterality: N/A;  . APPENDECTOMY    . BACK SURGERY  2000, 2013   x2  . BLADDER SURGERY     02  . CHOLECYSTECTOMY    . COLONOSCOPY    . COLONOSCOPY N/A 12/07/2014   Procedure: COLONOSCOPY;  Surgeon: Daneil Dolin, MD;  [facility];  Service: Endoscopy;  Laterality: N/A;  10:30 Am  . EYE SURGERY Bilateral    removed metal from eye  . HERNIA REPAIR Right   . INGUINAL HERNIA REPAIR Left 11/03/2013   Procedure: HERNIA REPAIR INGUINAL ADULT;  Surgeon: Gayland Curry, MD;  Location: [place];  Service: General;  Laterality: Left;  . SHOULDER SURGERY     RIGHT SHOULDER   . VIDEO BRONCHOSCOPY WITH ENDOBRONCHIAL NAVIGATION N/A 07/10/2019   Procedure: VIDEO BRONCHOSCOPY WITH ENDOBRONCHIAL NAVIGATION;  Surgeon:  Melrose Nakayama, MD;  Location: [facility];  Service: Thoracic;  Laterality: N/A;     reports that he has been smoking cigarettes. He has been smoking about 1.00 pack per day. He has never used smokeless tobacco. He reports current drug use. Drug: Marijuana. He reports that he does not drink alcohol.  Allergies  Allergen Reactions  . Bee Venom Anaphylaxis  . Codeine Other (See Comments)     incoherent   . Diovan [Valsartan] Other (See Comments)    incoherent  . Propoxyphene Other (See Comments)    Dizziness, "Makes me feel drunk"    Family History  Problem Relation Age of Onset  . Heart disease Mother        before age 81    Prior to Admission medications   Medication Sig Start Date End Date Taking? Authorizing Provider  aspirin EC 81 MG tablet Take 1 tablet (81 mg total) by mouth at bedtime. 05/21/20 08/19/20 Yes [place] Eric J, DO  atorvastatin (LIPITOR) 40 MG tablet Take 1 tablet (40 mg total) by mouth at bedtime. 05/21/20 08/19/20 Yes [place] Eric J, DO  donepezil (ARICEPT) 10 MG tablet Take 10 mg by mouth at bedtime.  06/28/16  Yes [provider]  gabapentin (NEURONTIN) 100 MG capsule Take 100 mg by mouth at bedtime. 11/06/19  Yes [provider]  insulin lispro (HUMALOG) 100 UNIT/ML injection Inject 5-10 Units into the skin daily as needed for high blood sugar.    Yes [provider]  LANTUS 100 UNIT/ML injection Inject 60 Units into the skin at bedtime.  06/09/16  Yes [provider]  losartan (COZAAR) 50 MG tablet Take 1 tablet (50 mg total) by mouth at bedtime. 05/21/20 08/19/20 Yes [place] Eric J, DO  Aspirin-Caffeine (BC FAST PAIN RELIEF PO) Take 1 packet by mouth daily as needed (pain).    [provider]  B-D UF III MINI PEN NEEDLES 31G X 5 MM MISC SMARTSIG:1 Each SUB-Q Daily 12/23/19   [provider]    Physical Exam: Vitals:   05/27/20 1205 05/27/20 1220 05/27/20 1235 05/27/20 1311  BP: (!) 135/56 (!) 135/93 139/64 (!) 142/66  Pulse: 90 85 88 92  Resp: 16 14 14 14   Temp:   (!) 97.1 F (36.2 C) 98.6 F (37 C)  TempSrc:    Oral  SpO2: 94% 94% 95% 98%  Weight:      Height:        General: Vital signs reviewed.  Patient is frail and malnourished, in no acute distress and cooperative with exam.  Head: Normocephalic and atraumatic. Eyes: EOMI, conjunctivae normal, no scleral icterus.  ENMT: Mucous  membranes are moist.  Neck: Supple, trachea midline,  Cardiovascular: RRR, S1 normal, S2 normal, no murmurs, gallops, or rubs. Pulmonary/Chest: Clear to auscultation bilaterally, no wheezes, rales, or rhonchi. Abdominal: Soft, non-tender, non-distended, BS +,  Extremities: No lower extremity edema bilaterally,  pulses symmetric and intact bilaterally. No cyanosis or clubbing. Neurological: A&O x3, Strength is normal and symmetric bilaterally, cranial nerve II-XII are grossly intact, no focal motor deficit, sensory intact to light touch bilaterally.  Skin: Left heel quarter size ulcer with eschar, no drainage. Psychiatric: Normal mood and affect.   Labs on Admission: I have personally reviewed following labs and imaging studies  CBC: Recent Labs  Lab 05/21/20 0324 05/25/20 0839 05/27/20 0845  WBC 7.7 9.5  --   HGB 8.5* 8.9* 6.4*  HCT 24.9* 26.7* 18.9*  MCV 91.5 92.7  --  PLT 285 383  --    Basic Metabolic Panel: Recent Labs  Lab 05/21/20 0324 05/25/20 0839  NA 136 132*  K 4.0 4.2  CL 100 98  CO2 25 27  GLUCOSE 101* 332*  BUN 11 11  CREATININE 1.28* 1.32*  CALCIUM 8.9 9.6   GFR: Estimated Creatinine Clearance: 40.6 mL/min (A) (by C-G formula based on SCr of 1.32 mg/dL (H)). Liver Function Tests: Recent Labs  Lab 05/25/20 0839  AST 21  ALT 15  ALKPHOS 108  BILITOT 0.4  PROT 7.4  ALBUMIN 3.7   No results for input(s): LIPASE, AMYLASE in the last 168 hours. No results for input(s): AMMONIA in the last 168 hours. Coagulation Profile: Recent Labs  Lab 05/25/20 0839  INR 1.0   Cardiac Enzymes: No results for input(s): CKTOTAL, CKMB, CKMBINDEX, TROPONINI in the last 168 hours. BNP (last 3 results) No results for input(s): PROBNP in the last 8760 hours. HbA1C: No results for input(s): HGBA1C in the last 72 hours. CBG: Recent Labs  Lab 05/21/20 1119 05/25/20 0813 05/27/20 0604 05/27/20 0703 05/27/20 1151  GLUCAP 299* 313* 353* 337* 95   Lipid  Profile: No results for input(s): CHOL, HDL, LDLCALC, TRIG, CHOLHDL, LDLDIRECT in the last 72 hours. Thyroid Function Tests: No results for input(s): TSH, T4TOTAL, FREET4, T3FREE, THYROIDAB in the last 72 hours. Anemia Panel: No results for input(s): VITAMINB12, FOLATE, FERRITIN, TIBC, IRON, RETICCTPCT in the last 72 hours. Urine analysis:    Component Value Date/Time   COLORURINE YELLOW 05/25/2020 0839   APPEARANCEUR CLEAR 05/25/2020 0839   LABSPEC 1.012 05/25/2020 0839   PHURINE 5.0 05/25/2020 0839   GLUCOSEU >=500 (A) 05/25/2020 0839   HGBUR SMALL (A) 05/25/2020 0839   BILIRUBINUR NEGATIVE 05/25/2020 0839   KETONESUR NEGATIVE 05/25/2020 0839   PROTEINUR 100 (A) 05/25/2020 0839   UROBILINOGEN 0.2 02/18/2014 0950   NITRITE NEGATIVE 05/25/2020 0839   LEUKOCYTESUR NEGATIVE 05/25/2020 0839    Radiological Exams on Admission: No results found.   Assessment/Plan Active Problems:   Critical lower limb ischemia (HCC)   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 (HCC)   Critical left lower extremity ischemia s/p left femoral above-knee peroneal bypass. Diabetic foot ulcer.  Patient had her bypass today with vascular surgery, tolerated the procedure well.  Received 2 unit of PRBC during the procedure due to hemoglobin of 6.4 when presented today. Left heel ulcer does not look actively infected at this time. -Continue postoperative care as recommended by vascular surgery. -PT/OT evaluation once appropriate and allowed by vascular surgery. -Continue with pain management -Continue aspirin and Lipitor  Acute blood loss anemia.  Denies any other obvious bleeding.  Received 2 unit of PRBC during procedure and H&H was 6.4 at that time.  It was 8.9 2 days ago. -Monitor hemoglobin -Transfuse if below 7  CKD stage IIIa.  Creatinine seems stable. -Monitor renal function. -Avoid nephrotoxins.  Severe protein caloric malnutrition. Estimated body mass index is 17.02 kg/m  as calculated from the following:   Height as of this encounter: 6\' 1"  (1.854 m).   Weight as of this encounter: 58.5 kg.  Severe muscle and fat depletion -Dietitian consult.  Uncontrolled type 2 diabetes mellitus with hyperglycemia.  A1c of 9.5 done on 05/18/2020. Patient required insulin infusion and Endo tool during surgery, now being transitioned to basal and SSI.  Patient was taking 60 units of Lantus at home. -Lantus 20 units twice daily. -Moderate SSI. -We will add mealtime coverage once  started taking enough p.o. intake.  History of dementia. -Continue home dose of donepezil  Essential hypertension. -Continue home dose of [place] and metoprolol.  History of tuberculosis Patient reports has been treated twice for tuberculosis infection. Patient follows with infectious disease, Dr. Megan Salon outpatient. Last seen in clinic December 2021 in which medications were discontinued due to nausea/vomiting and weight loss. 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 Patient is asymptomatic, chest x-ray with right upper lobe linear density improved likely scarring and no infiltrate or cavitary lesion. Discussed with ID, Dr. Gale Journey on 4/27; no further treatment indicated at this time; and can remove airborne isolation precautions. Outpatient follow-up with ID as needed.   DVT prophylaxis:  SCds Code Status: Full code Family Communication: None at bedside. Disposition Plan: To be determined  Consults called: Vascular Surgery  Admission status: Inpatient   Lorella Nimrod MD [facility]  If 7PM-7AM, please contact night-coverage [email or web address]  05/27/2020, 2:12 PM   This record has been created using Systems analyst. Errors have been sought and corrected,but may not always be located. Such creation errors do not reflect on the standard of care.

## 2020-05-27 NOTE — Progress Notes (Signed)
Inpatient Diabetes Program Recommendations  AACE/ADA: New Consensus Statement on Inpatient Glycemic Control (2015)  Target Ranges:  Prepandial:   less than 140 mg/dL      Peak postprandial:   less than 180 mg/dL (1-2 hours)      Critically ill patients:  140 - 180 mg/dL   Lab Results  Component Value Date   GLUCAP 95 05/27/2020   HGBA1C 9.2 (H) 05/19/2020    Review of Glycemic Control Results for Allen Yu, Allen Yu (MRN [id number]) as of 05/27/2020 14:53  Ref. Range 05/25/2020 08:13 05/27/2020 06:04 05/27/2020 07:03 05/27/2020 11:51  Glucose-Capillary Latest Ref Range: 70 - 99 mg/dL 313 (H) 353 (H) 337 (H) 95   Diabetes history:  DM2 Outpatient Diabetes medications:  Lantus 60 units daily Humalog 5-10 units TID Current orders for Inpatient glycemic control:  Lantus 20 units BID Novolog 0-15 units TID and 0-5 QHS  Attempted to speak with patient at bedside regarding DM management.  He is not alert enough post op.  Ordered LWWD booklet.    Will continue to follow while inpatient.  Thank you, Reche Dixon, RN, BSN Diabetes Coordinator Inpatient Diabetes Program [phone] (team pager from 8a-5p)

## 2020-05-28 DIAGNOSIS — I70229 Atherosclerosis of native arteries of extremities with rest pain, unspecified extremity: Secondary | ICD-10-CM | POA: Diagnosis not present

## 2020-05-28 DIAGNOSIS — Z794 Long term (current) use of insulin: Secondary | ICD-10-CM

## 2020-05-28 DIAGNOSIS — E1151 Type 2 diabetes mellitus with diabetic peripheral angiopathy without gangrene: Secondary | ICD-10-CM | POA: Diagnosis not present

## 2020-05-28 LAB — BPAM RBC
Blood Product Expiration Date: 202206042359
Blood Product Expiration Date: 202206042359
ISSUE DATE / TIME: 202205060953
ISSUE DATE / TIME: 202205060953
Unit Type and Rh: 5100
Unit Type and Rh: 5100

## 2020-05-28 LAB — TYPE AND SCREEN
ABO/RH(D): O POS
Antibody Screen: NEGATIVE
Unit division: 0
Unit division: 0

## 2020-05-28 LAB — CBC
HCT: 29.3 % — ABNORMAL LOW (ref 39.0–52.0)
Hemoglobin: 10 g/dL — ABNORMAL LOW (ref 13.0–17.0)
MCH: 30.8 pg (ref 26.0–34.0)
MCHC: 34.1 g/dL (ref 30.0–36.0)
MCV: 90.2 fL (ref 80.0–100.0)
Platelets: 325 10*3/uL (ref 150–400)
RBC: 3.25 MIL/uL — ABNORMAL LOW (ref 4.22–5.81)
RDW: 13.6 % (ref 11.5–15.5)
WBC: 12.7 10*3/uL — ABNORMAL HIGH (ref 4.0–10.5)
nRBC: 0 % (ref 0.0–0.2)

## 2020-05-28 LAB — LIPID PANEL
Cholesterol: 114 mg/dL (ref 0–200)
HDL: 41 mg/dL (ref >40)
LDL Cholesterol: 63 mg/dL (ref 0–99)
Total CHOL/HDL Ratio: 2.8 RATIO
Triglycerides: 49 mg/dL (ref <150)
VLDL: 10 mg/dL (ref 0–40)

## 2020-05-28 LAB — GLUCOSE, CAPILLARY
Glucose-Capillary: 47 mg/dL — ABNORMAL LOW (ref 70–99)
Glucose-Capillary: 180 mg/dL — ABNORMAL HIGH (ref 70–99)
Glucose-Capillary: 45 mg/dL — ABNORMAL LOW (ref 70–99)
Glucose-Capillary: 118 mg/dL — ABNORMAL HIGH (ref 70–99)
Glucose-Capillary: 60 mg/dL — ABNORMAL LOW (ref 70–99)
Glucose-Capillary: 73 mg/dL (ref 70–99)
Glucose-Capillary: 147 mg/dL — ABNORMAL HIGH (ref 70–99)

## 2020-05-28 MED ORDER — DEXTROSE 50 % IV SOLN
INTRAVENOUS | Status: AC
  Administered 2020-05-28: 50 mL
  Filled 2020-05-28: qty 50

## 2020-05-28 MED ORDER — INSULIN GLARGINE 100 UNIT/ML ~~LOC~~ SOLN
15.0000 [IU] | Freq: Every day | SUBCUTANEOUS | Status: DC
  Filled 2020-05-28 (×2): qty 0.15

## 2020-05-28 MED ORDER — ORAL CARE MOUTH RINSE
15.0000 mL | Freq: Two times a day (BID) | OROMUCOSAL | Status: DC
  Administered 2020-05-28 – 2020-05-30 (×6): 15 mL via OROMUCOSAL

## 2020-05-28 MED ORDER — INSULIN GLARGINE 100 UNIT/ML ~~LOC~~ SOLN
20.0000 [IU] | Freq: Every day | SUBCUTANEOUS | Status: DC
  Filled 2020-05-28: qty 0.2

## 2020-05-28 MED ORDER — DEXTROSE 50 % IV SOLN: 25.0000 g | INTRAVENOUS | Status: AC

## 2020-05-28 NOTE — Progress Notes (Addendum)
Patient successfully voided 175 ml s/p Foley removal with 0 ml post void residual.
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
## 2020-05-28 NOTE — Progress Notes (Signed)
Orthopedic Tech Progress Note Patient Details:  Allen Yu 1945/10/01 [id number] Spoke with RN boots are in materials. Patient ID: Allen Yu, male   DOB: Nov 29, 1945, 75 y.o.   MRN: [id number]   Ellouise Newer 05/28/2020, 10:13 AM

## 2020-05-28 NOTE — Progress Notes (Addendum)
Hypoglycemic Event  CBG: 47   Treatment: 4oz orange juice  Symptoms: none reported by patient  Follow-up CBG: BVQX:4503 CBG Result:45  50% Dextrose injection gven at 0653 Follow up  @0710             CBG result :180 Possible Reasons for Event:inadequate meal intake  Comments/MD notified: Princella Ion MD.   Renee Rival

## 2020-05-28 NOTE — Progress Notes (Signed)
Aline removed as ordered without difficulty, procedure well tolerated by the patient, pressure dressing applied to removal site, no bleeding noted will continue to monitor.

## 2020-05-28 NOTE — Progress Notes (Addendum)
  Progress Note    05/28/2020 7:12 AM 1 Day Post-Op  Subjective:  No complaints  Tm 99 now afebrile HR 70's-100's  811'B-147'W systolic 29% RA  Vitals:   05/28/20 0400 05/28/20 0433  BP: 118/61 118/61  Pulse: 81 63  Resp: 14 15  Temp:  98.3 F (36.8 C)  SpO2: 97% 97%    Physical Exam: Cardiac:  regular Lungs:  Non labored Incisions:  All incisions look good Extremities:  Brisk left PT/peroneal doppler signals.  Left calf is soft & non tender.  Heel wound on left present.   CBC    Component Value Date/Time   WBC 12.7 (H) 05/28/2020 0028   RBC 3.25 (L) 05/28/2020 0028   HGB 10.0 (L) 05/28/2020 0028   HCT 29.3 (L) 05/28/2020 0028   HCT 31.3 (L) 07/09/2015 0757   PLT 325 05/28/2020 0028   MCV 90.2 05/28/2020 0028   MCH 30.8 05/28/2020 0028   MCHC 34.1 05/28/2020 0028   RDW 13.6 05/28/2020 0028   LYMPHSABS 0.9 05/17/2020 0842   MONOABS 0.5 05/17/2020 0842   EOSABS 0.1 05/17/2020 0842   BASOSABS 0.0 05/17/2020 0842    BMET    Component Value Date/Time   NA 132 (L) 05/25/2020 0839   K 4.2 05/25/2020 0839   CL 98 05/25/2020 0839   CO2 27 05/25/2020 0839   GLUCOSE 332 (H) 05/25/2020 0839   BUN 11 05/25/2020 0839   CREATININE 1.17 05/27/2020 1324   CREATININE 1.10 11/26/2019 0934   CALCIUM 9.6 05/25/2020 0839   GFRNONAA >60 05/27/2020 1324   GFRAA >60 07/10/2019 0702    INR    Component Value Date/Time   INR 1.0 05/25/2020 0839     Intake/Output Summary (Last 24 hours) at 05/28/2020 5621 Last data filed at 05/28/2020 0530 Gross per 24 hour  Intake 3252.54 ml  Output 1100 ml  Net 2152.54 ml     Assessment:  75 y.o. male is s/p:  Left femoral to above-knee popliteal bypass with 6 mm Gore-Tex graft  1 Day Post-Op  Plan: -pt with patent bypass with brisk doppler signals left PT/peroneal. -Prevalon boot ordered yesterday to keep pressure off of his heel-discussed with RN that pt does not have this. Also discussed with pt to keep heel off the bed to  prevent pressure on the heel wound. -all incisions look good.  -DVT prophylaxis:  Sq heparin to start today   Leontine Locket, PA-C [facility] [phone] 05/28/2020 7:12 AM  Struggled with ambulation today Incsisions healing Doppler flow to foot Home when ambulatory  Ruta Hinds, MD [facility] [place] Office: [phone]

## 2020-05-28 NOTE — Progress Notes (Signed)
Pt refused night time Lantus, CBG was 147. Pt stated, all they do is give me insulin, and try to bring it up. " if I take it, I am going to pass out".  On call [facility] MD T Opyd text paged and notified. Pt doesn't want to be bothered, wants to sleep. Will continue to monitor.

## 2020-05-28 NOTE — Evaluation (Addendum)
Physical Therapy Evaluation Patient Details Name: Allen Yu MRN: [id number] DOB: September 22, 1945 Today's Date: 05/28/2020   History of Present Illness  75 y.o. male presents to [facility] hospital on 05/27/2020 for arteriorgram with possible angioplasty 2/2 L heel ulcer. Pt underwent L femoral to above-knee popliteal bypass on 05/27/2020. PMH of tuberculosis, PTSD, essential hypertension, GERD, type 1 diabetes mellitus, dementia, CKD.  Clinical Impression  Pt presents to PT with deficits in gait, balance, cognition, endurance, strength, power, safety awareness. Pt is agitated during session about having to walk. PT provides encouragement and education on the benefits of ambulation s/p revascularization procedure. Pt refuses utilization of RW despite significant instability and need of BUE support to maintain balance. Pt has very limited awareness of safety, and has reported a significant history of falls in the past. Pt will benefit from continued aggressive mobilization and PT POC to improve gait, balance, and activity tolerance. This PT recommends the patient not be ambulated, unless with PT/OT, if continuing to refuse use of RW. PT recommends SNF placement, pt is likely to refuse, further recommendations listed below.    Follow Up Recommendations SNF (pt very likely to refuse, thus recommend HHPT and SPT to wheelchair, utilizing wheelchair for all out of bed mobility due to high falls risk with ambulation)    Equipment Recommendations  None recommended by PT    Recommendations for Other Services       Precautions / Restrictions Precautions Precautions: Fall Precaution Comments: refusal of RW Restrictions Weight Bearing Restrictions: No      Mobility  Bed Mobility Overal bed mobility: Modified Independent Bed Mobility: Supine to Sit;Sit to Supine     Supine to sit: Modified independent (Device/Increase time) Sit to supine: Modified independent (Device/Increase time)   General bed mobility  comments: HOB elevated    Transfers Overall transfer level: Needs assistance Equipment used: Quad cane (IV pole) Transfers: Sit to/from [facility] Sit to Stand: Min assist Stand pivot transfers: Min assist       General transfer comment: minA due to posterior lean initially upon standing, minA for safety in SPT to avoid pulling out IV line  Ambulation/Gait Ambulation/Gait assistance: Min assist Gait Distance (Feet): 60 Feet Assistive device: Quad cane;IV Pole (railing) Gait Pattern/deviations: Step-through pattern;Wide base of support Gait velocity: reduced Gait velocity interpretation: <1.8 ft/sec, indicate of risk for recurrent falls General Gait Details: pt with slow, deliberate, long strides, initially holding quad cane and IV pole. Once in hallway pt abandons quad cane and grasps railing, needing minA to maintain balance at times when breaks in railing are present. Pt refuses use of RW despite PT encouragement due to instability.  Stairs            Wheelchair Mobility    Modified Rankin (Stroke Patients Only)       Balance Overall balance assessment: Needs assistance Sitting-balance support: No upper extremity supported;Feet supported Sitting balance-Leahy Scale: Good     Standing balance support: Bilateral upper extremity supported Standing balance-Leahy Scale: Poor Standing balance comment: reliant on BUE support and minG for static standing                             Pertinent Vitals/Pain Pain Assessment: 0-10 Pain Score: 10-Worst pain ever Pain Location: L foot with ambulation Pain Descriptors / Indicators: Aching Pain Intervention(s): Monitored during session    Home Living Family/patient expects to be discharged to:: Private residence Living Arrangements: Spouse/significant other Available  Help at Discharge: Family;Friend(s);Available 24 hours/day (pt reports he will only ask his brother for help, unsure of his  availability individually) Type of Home: House Home Access: Stairs to enter Entrance Stairs-Rails: Left Entrance Stairs-Number of Steps: 4 Home Layout: Two level;Able to live on main level with bedroom/bathroom (pt reports his bedroom is upstairs this evaluation, recently reported his bedroom was downstairs last admission) Home Equipment: Walker - 2 wheels;Cane - single point;Cane - quad      Prior Function Level of Independence: Independent   Gait / Transfers Assistance Needed: pt reports independence this session. Last admission, not long ago, the pt reported ambulating short distances with use of cane, needing assistance from others when ambulating longer distances. Pt also reported frequent falls, which he denies today  ADL's / Homemaking Assistance Needed: pt performs ADLs independently, requires assistance for IADLs        Hand Dominance   Dominant Hand: Right    Extremity/Trunk Assessment   Upper Extremity Assessment Upper Extremity Assessment: Generalized weakness    Lower Extremity Assessment Lower Extremity Assessment: Generalized weakness    Cervical / Trunk Assessment Cervical / Trunk Assessment: Kyphotic  Communication   Communication: HOH  Cognition Arousal/Alertness: Awake/alert Behavior During Therapy: Agitated Overall Cognitive Status: Impaired/Different from baseline Area of Impairment: Memory;Following commands;Safety/judgement;Awareness;Problem solving                     Memory: Decreased short-term memory;Decreased recall of precautions Following Commands: Follows one step commands with increased time (HOH, pt seems able to follow commands, just unwilling at times) Safety/Judgement: Decreased awareness of safety;Decreased awareness of deficits Awareness: Intellectual Problem Solving: Difficulty sequencing;Requires verbal cues;Requires tactile cues General Comments: pt becomes agitated and requires cues for safety to avoid pulling lines out.  Pt refuses use of RW for ambulation, instead utilizing IV pole and reaching for railings in hallway despite PT cues.      General Comments General comments (skin integrity, edema, etc.): VSS on RA    Exercises     Assessment/Plan    PT Assessment Patient needs continued PT services  PT Problem List Decreased strength;Decreased activity tolerance;Decreased balance;Decreased mobility;Decreased cognition;Decreased knowledge of use of DME;Decreased safety awareness;Decreased knowledge of precautions;Pain       PT Treatment Interventions DME instruction;Gait training;Stair training;Functional mobility training;Therapeutic activities;Therapeutic exercise;Balance training;Neuromuscular re-education;Patient/family education;Cognitive remediation    PT Goals (Current goals can be found in the Care Plan section)  Acute Rehab PT Goals Patient Stated Goal: to let leg heal and rest PT Goal Formulation: With patient Time For Goal Achievement: 06/11/20 Potential to Achieve Goals: Fair    Frequency Min 2X/week   Barriers to discharge        Co-evaluation               AM-PAC PT "6 Clicks" Mobility  Outcome Measure Help needed turning from your back to your side while in a flat bed without using bedrails?: None Help needed moving from lying on your back to sitting on the side of a flat bed without using bedrails?: None Help needed moving to and from a bed to a chair (including a wheelchair)?: A Little Help needed standing up from a chair using your arms (e.g., wheelchair or bedside chair)?: A Little Help needed to walk in hospital room?: A Little Help needed climbing 3-5 steps with a railing? : A Lot 6 Click Score: 19    End of Session   Activity Tolerance: Other (comment) (session limited due to agitation  and safety concerns) Patient left: in bed;with call bell/phone within reach;with bed alarm set Nurse Communication: Mobility status PT Visit Diagnosis: Unsteadiness on feet  (R26.81);Other abnormalities of gait and mobility (R26.89);Muscle weakness (generalized) (M62.81);History of falling (Z91.81)    Time: 4944-9675 PT Time Calculation (min) (ACUTE ONLY): 23 min   Charges:   PT Evaluation $PT Eval Low Complexity: 1 Low PT Treatments $Gait Training: 8-22 mins       Zenaida Niece, PT, DPT Acute Rehabilitation Pager: [phone]   Zenaida Niece 05/28/2020, 12:44 PM

## 2020-05-28 NOTE — Progress Notes (Addendum)
Ordered Prevalon boot for patient to offload left heel per order and placed boot upon delivery.

## 2020-05-28 NOTE — Plan of Care (Signed)

## 2020-05-28 NOTE — Progress Notes (Signed)
Hypoglycemic Event  CBG: 60  at 1627         Treatment: 4oz orange juice  Symptoms: none reported by patient  Follow-up CBG: Time:1703    CBG Result: 73  Possible Reasons for Event:inadequate meal intake  Comments/MD notified: Princella Ion MD.

## 2020-05-28 NOTE — Progress Notes (Signed)
Progress Note    Allen Yu  ACZ:[id number] DOB: [birth year]  DOA: 05/27/2020 PCP: [name], MD    Brief Narrative:     Medical records reviewed and are as summarized below:  Allen Yu is an 75 y.o. male with medical history significant of tuberculosis, PTSD, essential hypertension, GERD, type 2 diabetes mellitus, dementia, CKD who presents today to have left femoral above-knee popliteal bypass done with vascular surgery. After procedure vascular surgery requested [facility] for admission to manage his other chronic comorbidities.  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 Diabetic foot ulcer.  Patient had her bypass today with vascular surgery, tolerated the procedure well.  Received 2 unit of PRBC during the procedure due to hemoglobin of 6.4 when presented today. Left heel ulcer does not look actively infected at this time. -Continue postoperative care as recommended by vascular surgery. -PT/OT evaluation once appropriate and allowed by vascular surgery. -Continue aspirin and Lipitor  Acute blood loss anemia.  Denies any other obvious bleeding.  Received 2 unit of PRBC during procedure and H&H was 6.4 at that time.  -repeat 10 -daily labs  CKD stage IIIa.  Creatinine seems stable. -Monitor renal function. -Avoid nephrotoxins.  Severe protein caloric malnutrition. Estimated body mass index is 17.02 kg/m as calculated from the following:   Height as of this encounter: 6\' 1"  (1.854 m).   Weight as of this encounter: 58.5 kg.  Severe muscle and fat depletion -Dietitian consult.  Uncontrolled type 2 diabetes mellitus with hyperglycemia.  A1c of 9.5 done on 05/18/2020. Patient required insulin infusion and Endo tool during surgery, now being transitioned to basal and SSI.   -episode of hypoglycemia today-- will decrease lantus and monitor closely  History of dementia. -Continue home dose of donepezil  Essential hypertension. -Continue home dose of [place] and metoprolol.  History of tuberculosis Patient reports has been treated twice for tuberculosis infection. Patient follows with infectious disease, Dr. Megan Salon outpatient.      Family Communication/Anticipated D/C date and plan/Code Status   DVT prophylaxis: heparin Code Status: Full Code.  Disposition Plan: Status is: Inpatient  Remains inpatient appropriate because:Inpatient level of care appropriate due to severity of illness   Dispo: The patient is from: Home              Anticipated d/c is to: tbd              Patient currently is not medically stable to d/c.   Difficult to place patient No         Medical Consultants:    vascular     Subjective:   Hard of hearing but asking about If any other procedures planned  Objective:    Vitals:   05/28/20 0400 05/28/20 0433 05/28/20 0816 05/28/20 1023  BP: 118/61 118/61 116/60 (!) 135/91  Pulse: 81 63 63 60  Resp: 14 15 16 14   Temp:  98.3 F (36.8 C) 98.3 F (36.8 C) 98.5 F (36.9 C)  TempSrc:  Oral Oral Oral  SpO2: 97% 97% 97% 96%  Weight:  58.5 kg    Height:        Intake/Output Summary (Last 24 hours) at 05/28/2020 1047 Last data filed at 05/28/2020 0530 Gross per 24 hour  Intake 1737.54 ml  Output 550 ml  Net 1187.54  ml   Filed Weights   05/27/20 0607 05/28/20 0433  Weight: 58.5 kg 58.5 kg    Exam:  General: Appearance:    Thin frail male in no acute distress     Lungs:      respirations unlabored  Heart:    Normal heart rate. Normal rhythm. No murmurs, rubs, or gallops.   MS:   All extremities are intact.   Neurologic:   Very hard of hearing    Data Reviewed:   I have personally reviewed following labs and imaging studies:  Labs: Labs show the following:   Basic Metabolic Panel: Recent  Labs  Lab 05/25/20 0839 05/27/20 1324  NA 132*  --   K 4.2  --   CL 98  --   CO2 27  --   GLUCOSE 332*  --   BUN 11  --   CREATININE 1.32* 1.17  CALCIUM 9.6  --    GFR Estimated Creatinine Clearance: 45.8 mL/min (by C-G formula based on SCr of 1.17 mg/dL). Liver Function Tests: Recent Labs  Lab 05/25/20 0839  AST 21  ALT 15  ALKPHOS 108  BILITOT 0.4  PROT 7.4  ALBUMIN 3.7   No results for input(s): LIPASE, AMYLASE in the last 168 hours. No results for input(s): AMMONIA in the last 168 hours. Coagulation profile Recent Labs  Lab 05/25/20 0839  INR 1.0    CBC: Recent Labs  Lab 05/25/20 0839 05/27/20 0845 05/27/20 1324 05/28/20 0028  WBC 9.5  --  11.2* 12.7*  HGB 8.9* 6.4* 9.8* 10.0*  HCT 26.7* 18.9* 28.1* 29.3*  MCV 92.7  --  87.8 90.2  PLT 383  --  285 325   Cardiac Enzymes: No results for input(s): CKTOTAL, CKMB, CKMBINDEX, TROPONINI in the last 168 hours. BNP (last 3 results) No results for input(s): PROBNP in the last 8760 hours. CBG: Recent Labs  Lab 05/27/20 1705 05/27/20 2037 05/28/20 0620 05/28/20 0649 05/28/20 0710  GLUCAP 184* 107* 47* 45* 180*   D-Dimer: No results for input(s): DDIMER in the last 72 hours. Hgb A1c: No results for input(s): HGBA1C in the last 72 hours. Lipid Profile: Recent Labs    05/28/20 0028  CHOL 114  HDL 41  LDLCALC 63  TRIG 49  CHOLHDL 2.8   Thyroid function studies: No results for input(s): TSH, T4TOTAL, T3FREE, THYROIDAB in the last 72 hours.  Invalid input(s): FREET3 Anemia work up: No results for input(s): VITAMINB12, FOLATE, FERRITIN, TIBC, IRON, RETICCTPCT in the last 72 hours. Sepsis Labs: Recent Labs  Lab 05/25/20 0839 05/27/20 1324 05/28/20 0028  WBC 9.5 11.2* 12.7*    Microbiology Recent Results (from the past 240 hour(s))  SARS CORONAVIRUS 2 (TAT 6-24 HRS) Nasopharyngeal Nasopharyngeal Swab     Status: None   Collection Time: 05/25/20  8:36 AM   Specimen: Nasopharyngeal Swab   Result Value Ref Range Status   SARS Coronavirus 2 NEGATIVE NEGATIVE Final    Comment: (NOTE) SARS-CoV-2 target nucleic acids are NOT DETECTED.  The SARS-CoV-2 RNA is generally detectable in upper and lower respiratory specimens during the acute phase of infection. Negative results do not preclude SARS-CoV-2 infection, do not rule out co-infections with other pathogens, and should not be used as the sole basis for treatment or other patient management decisions. Negative results must be combined with clinical observations, patient history, and epidemiological information. The expected result is Negative.  Fact Sheet for Patients: SugarRoll.be  Fact Sheet for Healthcare Providers: https://www.woods-mathews.com/  This  test is not yet approved or cleared by the [place] and  has been authorized for detection and/or diagnosis of SARS-CoV-2 by FDA under an Emergency Use Authorization (EUA). This EUA will remain  in effect (meaning this test can be used) for the duration of the COVID-19 declaration under Se ction 564(b)(1) of the Act, 21 U.S.C. section 360bbb-3(b)(1), unless the authorization is terminated or revoked sooner.  Performed at [facility], [place] [address], [place], [place] [address]   Surgical pcr screen     Status: None   Collection Time: 05/25/20  8:40 AM   Specimen: Nasal Mucosa; Nasal Swab  Result Value Ref Range Status   MRSA, PCR NEGATIVE NEGATIVE Final   Staphylococcus aureus NEGATIVE NEGATIVE Final    Comment: (NOTE) The Xpert SA Assay (FDA approved for NASAL specimens in patients 55 years of age and older), is one component of a comprehensive surveillance program. It is not intended to diagnose infection nor to guide or monitor treatment. Performed at [facility], [place] [address] [place], [place] [address]     Procedures and diagnostic studies:  No results found.  Medications:   .  sodium chloride   Intravenous Once  . aspirin EC  81 mg Oral QHS  . atorvastatin  40 mg Oral QHS  . Chlorhexidine Gluconate Cloth  6 each Topical Daily  . docusate sodium  100 mg Oral Daily  . donepezil  10 mg Oral QHS  . gabapentin  100 mg Oral QHS  . heparin  5,000 Units Subcutaneous Q8H  . insulin aspart  0-15 Units Subcutaneous TID WC  . insulin aspart  0-5 Units Subcutaneous QHS  . insulin glargine  20 Units Subcutaneous QHS  . living well with diabetes book   Does not apply Once  . losartan  50 mg Oral QHS  . mouth rinse  15 mL Mouth Rinse BID  . pantoprazole  40 mg Oral Daily   Continuous Infusions: . sodium chloride    . sodium chloride 75 mL/hr at 05/28/20 1041  . magnesium sulfate bolus IVPB       LOS: 1 day   [facility]   How to contact the [facility] Attending or Consulting provider [place] or covering provider during after hours [place], for this patient?  1. Check the care team in [facility] and look for a) attending/consulting [facility] provider listed and b) the [facility] team listed 2. Log into [email or web address] and use [place]'s universal password to access. If you do not have the password, please contact the hospital operator. 3. Locate the [facility] provider you are looking for under [facility] and page to a number that you can be directly reached. 4. If you still have difficulty reaching the provider, please page the [facility] (Director on Call) for the Hospitalists listed on amion for assistance.  05/28/2020, 10:47 AM

## 2020-05-28 NOTE — Discharge Instructions (Signed)
 [facility]  Discharge instructions  Lower Extremity Bypass Surgery  Please refer to the following instruction for your post-procedure care. Your surgeon or physician assistant will discuss any changes with you.  Activity  You are encouraged to walk as much as you can. You can slowly return to normal activities during the month after your surgery. Avoid strenuous activity and heavy lifting until your doctor tells you it's OK. Avoid activities such as vacuuming or swinging a golf club. Do not drive until your doctor give the OK and you are no longer taking prescription pain medications. It is also normal to have difficulty with sleep habits, eating and bowel movement after surgery. These will go away with time.  Bathing/Showering  Shower daily after you go home. Do not soak in a bathtub, hot tub, or swim until the incision heals completely.  Incision Care  Clean your incision with mild soap and water. Shower every day. Pat the area dry with a clean towel. You do not need a bandage unless otherwise instructed. Do not apply any ointments or creams to your incision. If you have open wounds you will be instructed how to care for them or a visiting nurse may be arranged for you. If you have staples or sutures along your incision they will be removed at your post-op appointment. You may have skin glue on your incision. Do not peel it off. It will come off on its own in about one week.  Wash the groin wound with soap and water daily and pat dry. (No tub bath-only shower)  Then put a dry gauze or washcloth in the groin to keep this area dry to help prevent wound infection.  Do this daily and as needed.  Do not use Vaseline or neosporin on your incisions.  Only use soap and water on your incisions and then protect and keep dry.  Diet  Resume your normal diet. There are no special food restrictions following this procedure. A low fat/ low cholesterol diet is  recommended for all patients with vascular disease. In order to heal from your surgery, it is CRITICAL to get adequate nutrition. Your body requires vitamins, minerals, and protein. Vegetables are the best source of vitamins and minerals. Vegetables also provide the perfect balance of protein. Processed food has little nutritional value, so try to avoid this.  Medications  Resume taking all your medications unless your doctor or physician assistant tells you not to. If your incision is causing pain, you may take over-the-counter pain relievers such as acetaminophen (Tylenol). If you were prescribed a stronger pain medication, please aware these medication can cause nausea and constipation. Prevent nausea by taking the medication with a snack or meal. Avoid constipation by drinking plenty of fluids and eating foods with high amount of fiber, such as fruits, vegetables, and grains. Take Colace 100 mg (an over-the-counter stool softener) twice a day as needed for constipation.  Do not take Tylenol if you are taking prescription pain medications.  Follow Up  Our office will schedule a follow up appointment 2-3 weeks following discharge.  Please call us immediately for any of the following conditions  Severe or worsening pain in your legs or feet while at rest or while walking Increase pain, redness, warmth, or drainage (pus) from your incision site(s) Fever of 101 degree or higher The swelling in your leg with the bypass suddenly worsens and becomes more painful than when you were in the hospital If you have   been instructed to feel your graft pulse then you should do so every day. If you can no longer feel this pulse, call the office immediately. Not all patients are given this instruction.  Leg swelling is common after leg bypass surgery.  The swelling should improve over a few months following surgery. To improve the swelling, you may elevate your legs above the level of your heart while you are  sitting or resting. Your surgeon or physician assistant may ask you to apply an ACE wrap or wear compression (TED) stockings to help to reduce swelling.  Reduce your risk of vascular disease  Stop smoking. If you would like help call [facility] at 1-800-QUIT-NOW (1-800-784-8669) or [facility] at 336-586-4000.  Manage your cholesterol Maintain a desired weight Control your diabetes weight Control your diabetes Keep your blood pressure down  If you have any questions, please call the office at [phone]  

## 2020-05-28 NOTE — Progress Notes (Signed)
Patient's Foley was removed ~0500 this am.  Patient is still due to void.  Encouraged use of urinal.  Bladder scan vol at 1158 was 175 ml.  Will continue to encourage voiding and f/u will bladder scan as needed.

## 2020-05-29 DIAGNOSIS — I70229 Atherosclerosis of native arteries of extremities with rest pain, unspecified extremity: Secondary | ICD-10-CM | POA: Diagnosis not present

## 2020-05-29 DIAGNOSIS — Z794 Long term (current) use of insulin: Secondary | ICD-10-CM | POA: Diagnosis not present

## 2020-05-29 DIAGNOSIS — E1151 Type 2 diabetes mellitus with diabetic peripheral angiopathy without gangrene: Secondary | ICD-10-CM | POA: Diagnosis not present

## 2020-05-29 LAB — CBC
HCT: 27.7 % — ABNORMAL LOW (ref 39.0–52.0)
Hemoglobin: 9.4 g/dL — ABNORMAL LOW (ref 13.0–17.0)
MCH: 30.8 pg (ref 26.0–34.0)
MCHC: 33.9 g/dL (ref 30.0–36.0)
MCV: 90.8 fL (ref 80.0–100.0)
Platelets: 291 10*3/uL (ref 150–400)
RBC: 3.05 MIL/uL — ABNORMAL LOW (ref 4.22–5.81)
RDW: 13.5 % (ref 11.5–15.5)
WBC: 11.6 10*3/uL — ABNORMAL HIGH (ref 4.0–10.5)
nRBC: 0 % (ref 0.0–0.2)

## 2020-05-29 LAB — GLUCOSE, CAPILLARY
Glucose-Capillary: 139 mg/dL — ABNORMAL HIGH (ref 70–99)
Glucose-Capillary: 167 mg/dL — ABNORMAL HIGH (ref 70–99)
Glucose-Capillary: 120 mg/dL — ABNORMAL HIGH (ref 70–99)
Glucose-Capillary: 223 mg/dL — ABNORMAL HIGH (ref 70–99)

## 2020-05-29 LAB — BASIC METABOLIC PANEL
Anion gap: 7 (ref 5–15)
BUN: 10 mg/dL (ref 8–23)
CO2: 23 mmol/L (ref 22–32)
Calcium: 8.3 mg/dL — ABNORMAL LOW (ref 8.9–10.3)
Chloride: 106 mmol/L (ref 98–111)
Creatinine, Ser: 1.05 mg/dL (ref 0.61–1.24)
GFR, Estimated: >60 mL/min (ref >60)
Glucose, Bld: 130 mg/dL — ABNORMAL HIGH (ref 70–99)
Potassium: 3.4 mmol/L — ABNORMAL LOW (ref 3.5–5.1)
Sodium: 136 mmol/L (ref 135–145)

## 2020-05-29 MED ORDER — ADULT MULTIVITAMIN W/MINERALS CH
1.0000 | ORAL_TABLET | Freq: Every day | ORAL | Status: DC
  Administered 2020-05-29 – 2020-05-30 (×2): 1 via ORAL
  Filled 2020-05-29 (×2): qty 1

## 2020-05-29 MED ORDER — ENSURE ENLIVE PO LIQD
237.0000 mL | Freq: Two times a day (BID) | ORAL | Status: DC
  Administered 2020-05-29 – 2020-05-30 (×2): 237 mL via ORAL

## 2020-05-29 MED ORDER — POTASSIUM CHLORIDE CRYS ER 20 MEQ PO TBCR
40.0000 meq | EXTENDED_RELEASE_TABLET | Freq: Once | ORAL | Status: AC
  Administered 2020-05-29: 40 meq via ORAL
  Filled 2020-05-29: qty 2

## 2020-05-29 NOTE — Social Work (Signed)
CSW attempted to complete initial assessment and discuss the recommendation of a SNF.however pt would not awake. CSW spoke with pt's RN and she stating that pt requested not to be disturb this morning. CSW will go back later today.

## 2020-05-29 NOTE — TOC Initial Note (Addendum)
Transition of Care [facility]) - Initial/Assessment Note    Patient Details  Name: Allen Yu MRN: [id number] Date of Birth: [birth year]  Transition of Care [facility]) CM/SW Contact:    Vinie Sill, LCSW Phone Number: 05/29/2020, 12:05 PM  Clinical Narrative:                  CSW met patient at bedside. CSW introduced self and explained role. CSW discussed short term rehab at [facility]. Patient declined SNF. Patient states he wants to discharged home. Patient states he has family and friends that is able to assist.   Thurmond Butts, MSW, LCSW Clinical Social Worker   Expected Discharge Plan: [place] Barriers to Discharge: Continued Medical Work up   Patient Goals and CMS Choice        Expected Discharge Plan and Services Expected Discharge Plan: [place] In-house Referral: Clinical Social Work     Living arrangements for the past 2 months: Single Family Home                                      Prior Living Arrangements/Services Living arrangements for the past 2 months: Single Family Home Lives with:: Self,Spouse,Relatives          Need for Family Participation in Patient Care: Yes (Comment)   Current home services: DME Criminal Activity/Legal Involvement Pertinent to Current Situation/Hospitalization: No - Comment as needed  Activities of Daily Living Home Assistive Devices/Equipment: Cane (specify quad or straight),Wheelchair ADL Screening (condition at time of admission) Patient's cognitive ability adequate to safely complete daily activities?: Yes Is the patient deaf or have difficulty hearing?: Yes Does the patient have difficulty seeing, even when wearing glasses/contacts?: No Does the patient have difficulty concentrating, remembering, or making decisions?: Yes Patient able to express need for assistance with ADLs?: Yes Does the patient have difficulty dressing or bathing?: Yes Independently performs ADLs?:  No Dressing (OT): Needs assistance Is this a change from baseline?: Pre-admission baseline Grooming: Needs assistance Is this a change from baseline?: Pre-admission baseline Feeding: Independent Bathing: Independent Toileting: Independent In/Out Bed: Needs assistance Is this a change from baseline?: Pre-admission baseline Walks in Home: Needs assistance Is this a change from baseline?: Pre-admission baseline Does the patient have difficulty walking or climbing stairs?: Yes Weakness of Legs: Both Weakness of Arms/Hands: Both  Permission Sought/Granted Permission sought to share information with : Family Supports Permission granted to share information with : Yes, Verbal Permission Granted  Share Information with NAME: Damien Batty     Permission granted to share info w Relationship: spouse  Permission granted to share info w Contact Information: [phone]  Emotional Assessment Appearance:: Appears stated age Attitude/Demeanor/Rapport: Engaged,Self-Confident Affect (typically observed): Accepting,Pleasant,Appropriate Orientation: : Oriented to Self,Oriented to Place,Oriented to  Time,Oriented to Situation Alcohol / Substance Use: Not Applicable Psych Involvement: No (comment)  Admission diagnosis:  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 Critical lower limb ischemia ([place]) 05/21/2020  . Protein-calorie malnutrition, severe 05/19/2020  . Plantar callus 12/24/2019  . Cavitary pneumonia 09/14/2019  . 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 Unintentional weight loss 09/14/2019  . Coronary artery calcification seen on CT scan 10/28/2018  . Hilar  adenopathy 04/10/2016  . Lung nodule seen on imaging study 07/09/2015  . Acute encephalopathy   . Hypoglycemia due  to insulin   . Hypertension 07/08/2015  . 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[place]) 07/08/2015  . AKI (acute kidney injury) ([place]) 07/08/2015  . Normocytic anemia 07/08/2015  . Tobacco use disorder 07/08/2015  . Diverticulosis of colon without hemorrhage   . Left inguinal hernia 09/16/2013  . Altered mental status 11/17/2012  . Hypothermia 11/17/2012  . Leukocytosis 11/17/2012  . CKD (chronic kidney disease), stage II   . PTSD (post-traumatic stress disorder)   . GERD (gastroesophageal reflux disease)    PCP:  [name], MD Pharmacy:   [facility] - [address], [place] [address] Phone: [phone] Fax: [place], [place] [address] AT [place]. [address] [place] [address] Phone: [phone] Fax: [phone]     Social Determinants of Health (SDOH) Interventions    Readmission Risk Interventions No flowsheet data found.

## 2020-05-29 NOTE — Plan of Care (Signed)
Progressing, will continue to monitor.  

## 2020-05-29 NOTE — Progress Notes (Addendum)
  Progress Note    05/29/2020 8:28 AM 2 Days Post-Op  Subjective:  Sleeping.  No complaints  Afebrile HR 80's-100's  573'U-202'R systolic 427% RA  Vitals:   05/29/20 0326 05/29/20 0814  BP: 128/68 (!) 151/77  Pulse: 90 93  Resp: 14 17  Temp: 98 F (36.7 C) 98.5 F (36.9 C)  SpO2: 98% 100%    Physical Exam: Cardiac:  regular Lungs:  Non labored Incisions:  All are clean and dry Extremities:  +doppler signals left PT/AT/peroneal   CBC    Component Value Date/Time   WBC 11.6 (H) 05/29/2020 0044   RBC 3.05 (L) 05/29/2020 0044   HGB 9.4 (L) 05/29/2020 0044   HCT 27.7 (L) 05/29/2020 0044   HCT 31.3 (L) 07/09/2015 0757   PLT 291 05/29/2020 0044   MCV 90.8 05/29/2020 0044   MCH 30.8 05/29/2020 0044   MCHC 33.9 05/29/2020 0044   RDW 13.5 05/29/2020 0044   LYMPHSABS 0.9 05/17/2020 0842   MONOABS 0.5 05/17/2020 0842   EOSABS 0.1 05/17/2020 0842   BASOSABS 0.0 05/17/2020 0842    BMET    Component Value Date/Time   NA 136 05/29/2020 0044   K 3.4 (L) 05/29/2020 0044   CL 106 05/29/2020 0044   CO2 23 05/29/2020 0044   GLUCOSE 130 (H) 05/29/2020 0044   BUN 10 05/29/2020 0044   CREATININE 1.05 05/29/2020 0044   CREATININE 1.10 11/26/2019 0934   CALCIUM 8.3 (L) 05/29/2020 0044   GFRNONAA >60 05/29/2020 0044   GFRAA >60 07/10/2019 0702    INR    Component Value Date/Time   INR 1.0 05/25/2020 0839     Intake/Output Summary (Last 24 hours) at 05/29/2020 0828 Last data filed at 05/29/2020 0815 Gross per 24 hour  Intake 1811.96 ml  Output 675 ml  Net 1136.96 ml     Assessment:  75 y.o. male is s/p:  Left femoral to above-knee popliteal bypass with 6 mm Gore-Tex graft   2 Days Post-Op  Plan: -pt with patent bypass with +doppler signals left PT/AT/peroneal.  Reinforced to pt to use RW given unsteadiness.  -Prevalon boot in place to relieve pressure on heel -incisions look good -DVT prophylaxis:  Sq heparin   Leontine Locket, PA-C [facility] [phone] 05/29/2020 8:28 AM  Agree with above.  Should be able to d/c home tomorrow from our standpoint. Will arrange follow up with Dr Donnetta Hutching in the [facility] office  Ruta Hinds, MD [facility] [place] Office: [phone]

## 2020-05-29 NOTE — Progress Notes (Signed)
Initial Nutrition Assessment  DOCUMENTATION CODES:   Underweight (suspect severe PCM)  INTERVENTION:   Liberalize diet to REGULAR   Ensure Enlive po BID, each supplement provides 350 kcal and 20 grams of protein  MVI daily   NUTRITION DIAGNOSIS:   Increased nutrient needs related to wound healing as evidenced by estimated needs.  GOAL:   Patient will meet greater than or equal to 90% of their needs  MONITOR:   PO intake,Supplement acceptance,Weight trends,I & O's,Labs  REASON FOR ASSESSMENT:   Consult Assessment of nutrition requirement/status  ASSESSMENT:   Patient with PMH significant for TB, PTSD, essential HTN, GERD, type 1 DM, dementia, and CKD. Recently admitted with ulcer of L heel. Presents this admission to have L femoral above-knee popliteal bypass.   Attempted to contact patient, no answer. During patients admission last week, family reported patient experienced decreased intake over the last few months and significant weight loss. He was diagnosed with severe malnutrition. Suspect this continues but will need to obtain further history. Last meal completion charted as 50%. RD to provide supplementation to maximize kcal and protein this admission.   Records indicate patient weighed 68 kg one year ago and 58.5 kg this admission (14% in 11 months.).   UOP: 375 ml x 24 hrs   Medications: colace, SS novolog Labs: K 3.4 (L) CBG 45-180  Diet Order:   Diet Order            Diet Carb Modified Fluid consistency: Thin; Room service appropriate? Yes with Assist  Diet effective now                 EDUCATION NEEDS:   Not appropriate for education at this time  Skin:  Skin Integrity Issues:: Incisions,Diabetic Ulcer Diabetic Ulcer: L heel Incisions: L leg  Last BM:  5/6  Height:   Ht Readings from Last 1 Encounters:  05/27/20 6\' 1"  (1.854 m)    Weight:   Wt Readings from Last 1 Encounters:  05/28/20 58.5 kg   BMI:  Body mass index is 17.02  kg/m.  Estimated Nutritional Needs:   Kcal:  1900-2100 kcal  Protein:  95-115 grams  Fluid:  >/= 1.9 L/day  Mariana Single RD, LDN Clinical Nutrition Pager listed in [place]

## 2020-05-29 NOTE — Progress Notes (Signed)
Progress Note    Allen Yu  LGX:[id number] DOB: 04-Mar-1945  DOA: 05/27/2020 PCP: [name], MD    Brief Narrative:     Medical records reviewed and are as summarized below:  Allen Yu is an 75 y.o. male with medical history significant of tuberculosis, PTSD, essential hypertension, GERD, type 2 diabetes mellitus, dementia, CKD who presents today to have left femoral above-knee popliteal bypass done with vascular surgery. After procedure vascular surgery requested [facility] for admission to manage his other chronic comorbidities. Refusing SNF placement.    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 Diabetic foot ulcer.  Patient had her bypass today with vascular surgery, tolerated the procedure well.  Received 2 unit of PRBC during the procedure due to hemoglobin of 6.4 when presented today. Left heel ulcer does not look actively infected at this time. -Continue postoperative care as recommended by vascular surgery. -PT/OT - SNF-- patient refusing SNF -Continue aspirin and Lipitor  Acute blood loss anemia.  Denies any other obvious bleeding.  Received 2 unit of PRBC during procedure and H&H was 6.4 at that time.  -daily labs  CKD stage IIIa.  Creatinine seems stable. -Monitor renal function. -Avoid nephrotoxins.  Severe protein caloric malnutrition. Estimated body mass index is 17.02 kg/m as calculated from the following:   Height as of this encounter: 6\' 1"  (1.854 m).   Weight as of this encounter: 58.5 kg.  Severe muscle and fat depletion -Dietitian consult.  Uncontrolled type 2 diabetes mellitus with hyperglycemia.  A1c of 9.5 done on 05/18/2020. Patient required insulin infusion and Endo tool during surgery -episode of hypoglycemia-- holding lantus as patient refusing  dose -SSI for now-- can add meal coverage if needed  History of dementia. -Continue home dose of donepezil  Essential hypertension. -Continue home dose of [place] and metoprolol.  History of tuberculosis Patient reports has been treated twice for tuberculosis infection. Patient follows with infectious disease, Dr. Megan Salon outpatient.      Family Communication/Anticipated D/C date and plan/Code Status   DVT prophylaxis: heparin Code Status: Full Code.  Disposition Plan: Status is: Inpatient  Remains inpatient appropriate because:Inpatient level of care appropriate due to severity of illness   Dispo: The patient is from: Home              Anticipated d/c is to: tbd              Patient currently is not medically stable to d/c.   Difficult to place patient No         Medical Consultants:    vascular     Subjective:   Says he does not want to be bothered  Objective:    Vitals:   05/28/20 2325 05/29/20 0326 05/29/20 0814 05/29/20 1124  BP: 132/73 128/68 (!) 151/77 (!) 166/82  Pulse: 92 90 93 (!) 102  Resp: 19 14 17 18   Temp: 98 F (36.7 C) 98 F (36.7 C) 98.5 F (36.9 C) 98.5 F (36.9 C)  TempSrc: Oral Oral Oral Oral  SpO2: 96% 98% 100% 98%  Weight:      Height:        Intake/Output Summary (Last 24 hours) at 05/29/2020 1246 Last data filed at 05/29/2020 1000 Gross per 24 hour  Intake 1571.96 ml  Output 875 ml  Net  696.96 ml   Filed Weights   05/27/20 0607 05/28/20 0433  Weight: 58.5 kg 58.5 kg    Exam: Refused exam, very hard of hearing   Data Reviewed:   I have personally reviewed following labs and imaging studies:  Labs: Labs show the following:   Basic Metabolic Panel: Recent Labs  Lab 05/25/20 0839 05/27/20 1324 05/29/20 0044  NA 132*  --  136  K 4.2  --  3.4*  CL 98  --  106  CO2 27  --  23  GLUCOSE 332*  --  130*  BUN 11  --  10  CREATININE 1.32* 1.17 1.05  CALCIUM 9.6  --  8.3*   GFR Estimated Creatinine  Clearance: 51.1 mL/min (by C-G formula based on SCr of 1.05 mg/dL). Liver Function Tests: Recent Labs  Lab 05/25/20 0839  AST 21  ALT 15  ALKPHOS 108  BILITOT 0.4  PROT 7.4  ALBUMIN 3.7   No results for input(s): LIPASE, AMYLASE in the last 168 hours. No results for input(s): AMMONIA in the last 168 hours. Coagulation profile Recent Labs  Lab 05/25/20 0839  INR 1.0    CBC: Recent Labs  Lab 05/25/20 0839 05/27/20 0845 05/27/20 1324 05/28/20 0028 05/29/20 0044  WBC 9.5  --  11.2* 12.7* 11.6*  HGB 8.9* 6.4* 9.8* 10.0* 9.4*  HCT 26.7* 18.9* 28.1* 29.3* 27.7*  MCV 92.7  --  87.8 90.2 90.8  PLT 383  --  285 325 291   Cardiac Enzymes: No results for input(s): CKTOTAL, CKMB, CKMBINDEX, TROPONINI in the last 168 hours. BNP (last 3 results) No results for input(s): PROBNP in the last 8760 hours. CBG: Recent Labs  Lab 05/28/20 1627 05/28/20 1703 05/28/20 2111 05/29/20 0632 05/29/20 1125  GLUCAP 60* 73 147* 139* 167*   D-Dimer: No results for input(s): DDIMER in the last 72 hours. Hgb A1c: No results for input(s): HGBA1C in the last 72 hours. Lipid Profile: Recent Labs    05/28/20 0028  CHOL 114  HDL 41  LDLCALC 63  TRIG 49  CHOLHDL 2.8   Thyroid function studies: No results for input(s): TSH, T4TOTAL, T3FREE, THYROIDAB in the last 72 hours.  Invalid input(s): FREET3 Anemia work up: No results for input(s): VITAMINB12, FOLATE, FERRITIN, TIBC, IRON, RETICCTPCT in the last 72 hours. Sepsis Labs: Recent Labs  Lab 05/25/20 0839 05/27/20 1324 05/28/20 0028 05/29/20 0044  WBC 9.5 11.2* 12.7* 11.6*    Microbiology Recent Results (from the past 240 hour(s))  SARS CORONAVIRUS 2 (TAT 6-24 HRS) Nasopharyngeal Nasopharyngeal Swab     Status: None   Collection Time: 05/25/20  8:36 AM   Specimen: Nasopharyngeal Swab  Result Value Ref Range Status   SARS Coronavirus 2 NEGATIVE NEGATIVE Final    Comment: (NOTE) SARS-CoV-2 target nucleic acids are NOT  DETECTED.  The SARS-CoV-2 RNA is generally detectable in upper and lower respiratory specimens during the acute phase of infection. Negative results do not preclude SARS-CoV-2 infection, do not rule out co-infections with other pathogens, and should not be used as the sole basis for treatment or other patient management decisions. Negative results must be combined with clinical observations, patient history, and epidemiological information. The expected result is Negative.  Fact Sheet for Patients: SugarRoll.be  Fact Sheet for Healthcare Providers: https://www.woods-mathews.com/  This test is not yet approved or cleared by the [place] FDA and  has been authorized for detection and/or diagnosis of SARS-CoV-2 by FDA under an Emergency Use Authorization (EUA). This  EUA will remain  in effect (meaning this test can be used) for the duration of the COVID-19 declaration under Se ction 564(b)(1) of the Act, 21 U.S.C. section 360bbb-3(b)(1), unless the authorization is terminated or revoked sooner.  Performed at [facility], [place] [address], [place], [place] [address]   Surgical pcr screen     Status: None   Collection Time: 05/25/20  8:40 AM   Specimen: Nasal Mucosa; Nasal Swab  Result Value Ref Range Status   MRSA, PCR NEGATIVE NEGATIVE Final   Staphylococcus aureus NEGATIVE NEGATIVE Final    Comment: (NOTE) The Xpert SA Assay (FDA approved for NASAL specimens in patients 80 years of age and older), is one component of a comprehensive surveillance program. It is not intended to diagnose infection nor to guide or monitor treatment. Performed at [facility], [place] [address] [place], [place] [address]     Procedures and diagnostic studies:  No results found.  Medications:   . sodium chloride   Intravenous Once  . aspirin EC  81 mg Oral QHS  . atorvastatin  40 mg Oral QHS  . Chlorhexidine Gluconate Cloth  6 each  Topical Daily  . docusate sodium  100 mg Oral Daily  . donepezil  10 mg Oral QHS  . feeding supplement  237 mL Oral BID BM  . gabapentin  100 mg Oral QHS  . heparin  5,000 Units Subcutaneous Q8H  . insulin aspart  0-15 Units Subcutaneous TID WC  . insulin aspart  0-5 Units Subcutaneous QHS  . living well with diabetes book   Does not apply Once  . losartan  50 mg Oral QHS  . mouth rinse  15 mL Mouth Rinse BID  . multivitamin with minerals  1 tablet Oral Daily  . pantoprazole  40 mg Oral Daily   Continuous Infusions: . sodium chloride    . magnesium sulfate bolus IVPB       LOS: 2 days   Geradine Girt  [facility]   How to contact the [facility] Attending or Consulting provider [place] or covering provider during after hours [place], for this patient?  1. Check the care team in [facility] and look for a) attending/consulting TRH provider listed and b) the [facility] team listed 2. Log into [email or web address] and use [place]'s universal password to access. If you do not have the password, please contact the hospital operator. 3. Locate the [facility] provider you are looking for under [facility] and page to a number that you can be directly reached. 4. If you still have difficulty reaching the provider, please page the [facility] (Director on Call) for the Hospitalists listed on amion for assistance.  05/29/2020, 12:46 PM

## 2020-05-29 NOTE — Progress Notes (Signed)
Inpatient Rehab Admissions Coordinator Note:   Pt. was screened for CIR candidacy by Jabree Rebert, MS CCC-SLP. At this time, Pt. Appears to have functional decline and is a potential candidate for CIR. Will place order for rehab consult per protocol.  Please contact me with questions.   Anastasiya Gowin, MS, CCC-SLP Rehab Admissions Coordinator  [phone] (celll) [phone] (office)  

## 2020-05-29 NOTE — Progress Notes (Signed)
Mobility Specialist: Progress Note   05/29/20 1558  Mobility  Activity Ambulated in hall  Level of Assistance Minimal assist, patient does 75% or more  Assistive Device Front wheel walker  Distance Ambulated (ft) 84 ft  Mobility Response Tolerated fair  Mobility performed by Mobility specialist  $Mobility charge 1 Mobility   Post-Mobility: 107 HR  Pt agreeable to ambulate with RW this session. Pt independent to sit EOB and minA to stand. Pt was minA during ambulation as well due to posterior lean. Pt required frequent verbal cues for RW placement and shortening his stride while walking. Pt c/o 6/10 pain post-ambulation in LLE, RN present. Pt back to bed after walk.   [facility] Cailie Bosshart Mobility Specialist Mobility Specialist Phone: [phone]

## 2020-05-30 ENCOUNTER — Encounter (HOSPITAL_COMMUNITY): Payer: Self-pay | Admitting: Vascular Surgery

## 2020-05-30 LAB — GLUCOSE, CAPILLARY: Glucose-Capillary: 187 mg/dL — ABNORMAL HIGH (ref 70–99)

## 2020-05-30 MED ORDER — OXYCODONE-ACETAMINOPHEN 5-325 MG PO TABS: 1.0000 | ORAL_TABLET | Freq: Four times a day (QID) | ORAL | 0 refills | Status: DC | PRN

## 2020-05-30 NOTE — Progress Notes (Signed)
D/C instructions given to patient. Wound care and medications reviewed. All questions answered. Son to escort pt home.  Clyde Canterbury, RN

## 2020-05-30 NOTE — TOC Transition Note (Signed)
Transition of Care [facility]) - CM/SW Discharge Note   Patient Details  Name: JOHNTE PORTNOY MRN: [id number] Date of Birth: 12-Oct-1945  [facility]) CM/SW Contact:  Bartholomew Crews, RN Phone Number: [phone] 05/30/2020, 10:34 AM   Clinical Narrative:     Spoke with patient at the bedside to discuss transition planning. Patient declining to go to SNF; declining CIR; and declining HH services. Has DME at home to include wheelchair, walker, and 4-prong cane. Family to provide transportation home. No further TOC needs identified.   Final next level of care: Home/Self Care Barriers to Discharge: No Barriers Identified   Patient Goals and CMS Choice Patient states their goals for this hospitalization and ongoing recovery are:: return home with family CMS Medicare.gov Compare Post Acute Care list provided to:: Patient Choice offered to / list presented to : Patient  Discharge Placement                       Discharge Plan and Services In-house Referral: Clinical Social Work              DME Arranged: N/A DME Agency: NA       HH Arranged: Patient Refused [place] Agency: NA        Social Determinants of Health (SDOH) Interventions     Readmission Risk Interventions No flowsheet data found.

## 2020-05-30 NOTE — Progress Notes (Signed)
[facility] [place]  Subjective  - Ready to go home   Objective (!) 150/95 99 98.5 F (36.9 C) (Oral) 19 100%  Intake/Output Summary (Last 24 hours) at 05/30/2020 8756 Last data filed at 05/30/2020 4332 Gross per 24 hour  Intake --  Output 2000 ml  Net -2000 ml    Left groin soft without hematoma Left LE popliteal incision healing well Doppler signals DP/PT/peroneal intact Lungs non labored breathing   Assessment/Planning: POD # 3  75 y.o. male is s/p:  Left femoral to above-knee popliteal bypass with 6 mm Gore-Tex graft Stable disposition with goo inflow patent bypass.  F/U with Dr. Donnetta Hutching in [place] 2-3 weeks   Roxy Horseman 05/30/2020 7:21 AM --  Laboratory Lab Results: Recent Labs    05/28/20 0028 05/29/20 0044  WBC 12.7* 11.6*  HGB 10.0* 9.4*  HCT 29.3* 27.7*  PLT 325 291   BMET Recent Labs    05/27/20 1324 05/29/20 0044  NA  --  136  K  --  3.4*  CL  --  106  CO2  --  23  GLUCOSE  --  130*  BUN  --  10  CREATININE 1.17 1.05  CALCIUM  --  8.3*    COAG Lab Results  Component Value Date   INR 1.0 05/25/2020   INR 1.0 07/10/2019   INR 1.11 07/08/2015   No results found for: PTT

## 2020-05-30 NOTE — Discharge Summary (Signed)
[facility] Discharge Summary   Patient ID:  Allen Yu MRN: [id number] DOB/AGE: 1945-05-19 75 y.o.  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 Anemia   . Arthritis   . Carotid stenosis   . Chronic back pain   . Chronic kidney disease    stones hx  . Dementia ([place])   . Diabetes mellitus   . GERD (gastroesophageal reflux disease)   . Hypertension   . Lung nodule   . Peripheral neuropathy   . PTSD (post-traumatic stress disorder)   . Schizophrenia ([place])   . Tuberculosis    MED TX X 1 YEAR YEARS AGO     Procedure(s): LEFT FEMORAL TO PERONEAL ARTERY BYPASS  Discharged Condition: stable  HPI: 75 y/o male with left foot ischemic changes to the heel and non healing wound.  05/20/20 Dr. Oneida Alar performed an angiogram Which showed Long segment occlusion left superficial femoral artery.  He was scheduled for left LE bypass.  Hospital Course:  Allen Yu is a 75 y.o. male is S/P Left Procedure(s): LEFT FEMORAL TO PERONEAL ARTERY BYPASS Inflow patent with good doppler signals AT/PT/peroneal signals. Incisions are healing well.  Will cont. To monitor heel wound.  He will f/u in [place] with Dr. Donnetta Hutching.  Pain controlled on PO medication.    Significant Diagnostic Studies: CBC Lab Results  Component Value Date   WBC 11.6 (H) 05/29/2020   HGB 9.4 (L) 05/29/2020   HCT 27.7 (L) 05/29/2020   MCV 90.8 05/29/2020   PLT 291 05/29/2020    BMET    Component Value Date/Time   NA 136 05/29/2020 0044   K 3.4 (L) 05/29/2020 0044   CL 106  05/29/2020 0044   CO2 23 05/29/2020 0044   GLUCOSE 130 (H) 05/29/2020 0044   BUN 10 05/29/2020 0044   CREATININE 1.05 05/29/2020 0044   CREATININE 1.10 11/26/2019 0934   CALCIUM 8.3 (L) 05/29/2020 0044   GFRNONAA >60 05/29/2020 0044   GFRAA >60 07/10/2019 0702   COAG Lab Results  Component Value Date   INR 1.0 05/25/2020   INR 1.0 07/10/2019   INR 1.11 07/08/2015     Disposition:  Discharge to :Home Discharge Instructions    Call MD for:  redness, tenderness, or signs of infection (pain, swelling, bleeding, redness, odor or green/yellow discharge around incision site)   Complete by: As directed    Call MD for:  severe or increased pain, loss or decreased feeling  in affected limb(s)   Complete by: As directed    Call MD for:  temperature >100.5   Complete by: As directed    Resume previous diet   Complete by: As directed      Allergies as of 05/30/2020      Reactions   Bee Venom Anaphylaxis   Codeine Other (See Comments)   incoherent    Diovan [valsartan] Other (See Comments)   incoherent   Propoxyphene Other (See Comments)   Dizziness, "Makes me feel drunk"      Medication List    TAKE these medications   aspirin EC 81 MG tablet Take 1 tablet (81 mg  total) by mouth at bedtime.   atorvastatin 40 MG tablet Commonly known as: LIPITOR Take 1 tablet (40 mg total) by mouth at bedtime.   B-D UF III MINI PEN NEEDLES 31G X 5 MM Misc Generic drug: Insulin Pen Needle SMARTSIG:1 Each SUB-Q Daily   BC FAST PAIN RELIEF PO Take 1 packet by mouth daily as needed (pain).   donepezil 10 MG tablet Commonly known as: ARICEPT Take 10 mg by mouth at bedtime.   gabapentin 100 MG capsule Commonly known as: NEURONTIN Take 100 mg by mouth at bedtime.   insulin lispro 100 UNIT/ML injection Commonly known as: HUMALOG Inject 5-10 Units into the skin daily as needed for high blood sugar.   Lantus 100 UNIT/ML injection Generic drug: insulin glargine Inject 60 Units into the  skin at bedtime.   losartan 50 MG tablet Commonly known as: COZAAR Take 1 tablet (50 mg total) by mouth at bedtime.   oxyCODONE-acetaminophen 5-325 MG tablet Commonly known as: PERCOCET/ROXICET Take 1 tablet by mouth every 6 (six) hours as needed for moderate pain.      Verbal and written Discharge instructions given to the patient. Wound care per Discharge AVS  Follow-up Information    Early, Arvilla Meres, MD In 3 weeks.   Specialties: Vascular Surgery, Cardiology Why: Office will call you to arrange your appt (sent) Contact information: [place] [address] [phone]               Signed: Roxy Horseman 05/30/2020, 7:40 AM - For VQI Registry use --- Instructions: Press F2 to tab through selections.  Delete question if not applicable.   Post-op:  Wound infection: No  Graft infection: No  Transfusion: Yes  If yes, 2 units given New Arrhythmia: No Ipsilateral amputation: [x ] no, [ ]  Minor, [ ]  BKA, [ ]  AKA Discharge patency: [x ] Primary, [ ]  Primary assisted, [ ]  Secondary, [ ]  Occluded Patency judged by: [x ] Dopper only, [ ]  Palpable graft pulse, [ ]  Palpable distal pulse, [ ]  ABI inc. > 0.15, [ ]  Duplex  D/C Ambulatory Status: Ambulatory with Assistance  Complications: MI: [x ] No, [ ]  Troponin only, [ ]  EKG or Clinical CHF: No Resp failure: [x ] none, [ ]  Pneumonia, [ ]  Ventilator Chg in renal function: [x ] none, [ ]  Inc. Cr > 0.5, [ ]  Temp. Dialysis, [ ]  Permanent dialysis Stroke: [x ] None, [ ]  Minor, [ ]  Major Return to OR: No  Reason for return to OR: [ ]  Bleeding, [ ]  Infection, [ ]  Thrombosis, [ ]  Revision  Discharge medications: Statin use:  Yes ASA use:  Yes Plavix use:  No  for medical reason not indicated Beta blocker use: No  for medical reason not indicated Coumadin use: No  for medical reason not indicated

## 2020-06-02 LAB — POCT I-STAT, CHEM 8
BUN: 17 mg/dL (ref 8–23)
BUN: 17 mg/dL (ref 8–23)
BUN: 15 mg/dL (ref 8–23)
Calcium, Ion: 1.18 mmol/L (ref 1.15–1.40)
Calcium, Ion: 1.21 mmol/L (ref 1.15–1.40)
Calcium, Ion: 1.13 mmol/L — ABNORMAL LOW (ref 1.15–1.40)
Chloride: 101 mmol/L (ref 98–111)
Chloride: 103 mmol/L (ref 98–111)
Chloride: 105 mmol/L (ref 98–111)
Creatinine, Ser: 1.2 mg/dL (ref 0.61–1.24)
Creatinine, Ser: 1.1 mg/dL (ref 0.61–1.24)
Creatinine, Ser: 1 mg/dL (ref 0.61–1.24)
Glucose, Bld: 241 mg/dL — ABNORMAL HIGH (ref 70–99)
Glucose, Bld: 128 mg/dL — ABNORMAL HIGH (ref 70–99)
Glucose, Bld: 102 mg/dL — ABNORMAL HIGH (ref 70–99)
HCT: 17 % — ABNORMAL LOW (ref 39.0–52.0)
HCT: 19 % — ABNORMAL LOW (ref 39.0–52.0)
HCT: 25 % — ABNORMAL LOW (ref 39.0–52.0)
Hemoglobin: 5.8 g/dL — CL (ref 13.0–17.0)
Hemoglobin: 6.5 g/dL — CL (ref 13.0–17.0)
Hemoglobin: 8.5 g/dL — ABNORMAL LOW (ref 13.0–17.0)
Potassium: 3.3 mmol/L — ABNORMAL LOW (ref 3.5–5.1)
Potassium: 3.3 mmol/L — ABNORMAL LOW (ref 3.5–5.1)
Potassium: 3.5 mmol/L (ref 3.5–5.1)
Sodium: 136 mmol/L (ref 135–145)
Sodium: 139 mmol/L (ref 135–145)
Sodium: 140 mmol/L (ref 135–145)
TCO2: 25 mmol/L (ref 22–32)
TCO2: 26 mmol/L (ref 22–32)
TCO2: 25 mmol/L (ref 22–32)

## 2020-06-06 ENCOUNTER — Telehealth: Payer: Self-pay

## 2020-06-06 NOTE — Telephone Encounter (Signed)
Patient's wife called and left a voice message saying his [facility] has not come back since May 11th and said they will not be back due to staffing issues.  Says pt is a diabetic and needs wound care. Our office will contact wife once we get more information.

## 2020-06-07 DIAGNOSIS — I739 Peripheral vascular disease, unspecified: Secondary | ICD-10-CM | POA: Diagnosis not present

## 2020-06-07 DIAGNOSIS — L89620 Pressure ulcer of left heel, unstageable: Secondary | ICD-10-CM | POA: Diagnosis not present

## 2020-06-10 ENCOUNTER — Ambulatory Visit: Payer: Medicare Other | Admitting: Vascular Surgery

## 2020-06-14 ENCOUNTER — Telehealth: Payer: Self-pay

## 2020-06-14 NOTE — Telephone Encounter (Signed)
Pt's wife called with concerns of pt's foot forming blisters and wound having an odor with drainage and swelling. They had been added on to MD schedule for this week. Advised them to protect blisters and wound; keep area clean and dry and elevate leg. Allen Yu verbalized understanding.

## 2020-06-17 ENCOUNTER — Other Ambulatory Visit: Payer: Self-pay

## 2020-06-17 ENCOUNTER — Ambulatory Visit (INDEPENDENT_AMBULATORY_CARE_PROVIDER_SITE_OTHER): Payer: Medicare Other | Admitting: Vascular Surgery

## 2020-06-17 ENCOUNTER — Encounter: Payer: Self-pay | Admitting: Vascular Surgery

## 2020-06-17 VITALS — BP 183/82 | HR 95 | Temp 98.3°F | Resp 20 | Ht 73.0 in | Wt 129.0 lb

## 2020-06-17 DIAGNOSIS — I739 Peripheral vascular disease, unspecified: Secondary | ICD-10-CM

## 2020-06-17 MED ORDER — OXYCODONE-ACETAMINOPHEN 5-325 MG PO TABS: 1.0000 | ORAL_TABLET | ORAL | 0 refills | Status: DC | PRN

## 2020-06-17 NOTE — Progress Notes (Signed)
Patient ID: Allen Yu, male   DOB: 02/19/1945, 75 y.o.   MRN: [id number]  Reason for Consult: Follow-up   Referred by [name], MD  Subjective:     HPI:  Allen Yu is a 75 y.o. male recent left femoral to popliteal artery bypass with graft.  He has runoff via the peroneal artery although this is occluded distally.  He has progressive left heel ulceration which is quite painful.  He has also developed blisters on the foot.  Patient has not walked since before surgery due to difficulty with neuropathy.  He is in a wheelchair today.  States that his pain is 10 out of 10 and he is unable to sleep.  Wound on his heel with foul smell  Past Medical History:  Diagnosis Date  . Anemia   . Arthritis   . Carotid stenosis   . Chronic back pain   . Chronic kidney disease    stones hx  . Dementia ([place])   . Diabetes mellitus   . GERD (gastroesophageal reflux disease)   . Hypertension   . Lung nodule   . Peripheral neuropathy   . PTSD (post-traumatic stress disorder)   . Schizophrenia ([place])   . Tuberculosis    MED TX X 1 YEAR YEARS AGO    Family History  Problem Relation Age of Onset  . Heart disease Mother        before age 3   Past Surgical History:  Procedure Laterality Date  . ABDOMINAL AORTOGRAM W/LOWER EXTREMITY N/A 05/20/2020   Procedure: ABDOMINAL AORTOGRAM W/LOWER EXTREMITY;  Surgeon: Elam Dutch, MD;  Location: [facility];  Service: Cardiovascular;  Laterality: N/A;  . APPENDECTOMY    . BACK SURGERY  2000, 2013   x2  . BLADDER SURGERY     02  . CHOLECYSTECTOMY    . COLONOSCOPY    . COLONOSCOPY N/A 12/07/2014   Procedure: COLONOSCOPY;  Surgeon: Daneil Dolin, MD;  [facility];  Service: Endoscopy;  Laterality: N/A;  10:30 Am  . EYE SURGERY Bilateral    removed metal from eye  . FEMORAL-TIBIAL BYPASS GRAFT Left 05/27/2020   Procedure: LEFT FEMORAL TO PERONEAL ARTERY BYPASS;  Surgeon: Rosetta Posner, MD;  Location: [facility];  Service:  Vascular;  Laterality: Left;  . HERNIA REPAIR Right   . INGUINAL HERNIA REPAIR Left 11/03/2013   Procedure: HERNIA REPAIR INGUINAL ADULT;  Surgeon: Gayland Curry, MD;  Location: [facility];  Service: General;  Laterality: Left;  . SHOULDER SURGERY     RIGHT SHOULDER   . VIDEO BRONCHOSCOPY WITH ENDOBRONCHIAL NAVIGATION N/A 07/10/2019   Procedure: VIDEO BRONCHOSCOPY WITH ENDOBRONCHIAL NAVIGATION;  Surgeon: Melrose Nakayama, MD;  Location: [facility];  Service: Thoracic;  Laterality: N/A;    Short Social History:  Social History   Tobacco Use  . Smoking status: Current Every Day Smoker    Packs/day: 0.50    Types: Cigarettes  . Smokeless tobacco: Never Used  Substance Use Topics  . Alcohol use: No    Alcohol/week: 0.0 standard drinks    Allergies  Allergen Reactions  . Bee Venom Anaphylaxis  . Codeine Other (See Comments)    incoherent   . Diovan [Valsartan] Other (See Comments)    incoherent  . Propoxyphene Other (See Comments)    Dizziness, "Makes me feel drunk"    Current Outpatient Medications  Medication Sig Dispense Refill  . aspirin EC 81 MG tablet Take 1  tablet (81 mg total) by mouth at bedtime. 90 tablet 0  . Aspirin-Caffeine (BC FAST PAIN RELIEF PO) Take 1 packet by mouth daily as needed (pain).    [name] atorvastatin (LIPITOR) 40 MG tablet Take 1 tablet (40 mg total) by mouth at bedtime. 90 tablet 0  . B-D UF III MINI PEN NEEDLES 31G X 5 MM MISC SMARTSIG:1 Each SUB-Q Daily    . donepezil (ARICEPT) 10 MG tablet Take 10 mg by mouth at bedtime.     . gabapentin (NEURONTIN) 100 MG capsule Take 100 mg by mouth at bedtime.    . insulin lispro (HUMALOG) 100 UNIT/ML injection Inject 5-10 Units into the skin daily as needed for high blood sugar.     [name] LANTUS 100 UNIT/ML injection Inject 60 Units into the skin at bedtime.     [name] losartan (COZAAR) 50 MG tablet Take 1 tablet (50 mg total) by mouth at bedtime. 90 tablet 0  . oxyCODONE (OXY IR/ROXICODONE) 5 MG immediate release tablet Take  5 mg by mouth every 6 (six) hours as needed.    [name] oxyCODONE-acetaminophen (PERCOCET/ROXICET) 5-325 MG tablet Take 1 tablet by mouth every 6 (six) hours as needed for moderate pain. 30 tablet 0   No current facility-administered medications for this visit.    REVIEW OF SYSTEMS  Left foot and leg pain    Objective:  Objective   Vitals:   06/17/20 1450  BP: (!) 183/82  Pulse: 95  Resp: 20  Temp: 98.3 F (36.8 C)  SpO2: 96%  Weight: 129 lb (58.5 kg)  Height: 6\' 1"  (1.854 m)   Body mass index is 17.02 kg/m.  Physical Exam Awake alert oriented Left lower extremity incisions mostly healing well Good Doppler signal left peroneal artery Large blister left foot Left heel ulceration stable in size however very soft and entirely black now       Assessment/Plan:    75 year old male status post left femoropopliteal bypass with incomplete runoff to the foot.  Unfortunately his left heel ulceration is worsening and I recommended below-knee amputation.  Patient and his wife want to discuss this with Dr. Donnetta Hutching and we will get him into the [facility] office with the next available appointment.  I discussed with him that worsening pain or smell or certainly any fevers requires urgent evaluation at the emergency department and will require below-knee amputation.  They demonstrate good understanding.  I have sent a final prescription for Percocet to their pharmacy in [place].     Waynetta Sandy MD [facility]

## 2020-06-22 DIAGNOSIS — D649 Anemia, unspecified: Secondary | ICD-10-CM | POA: Diagnosis not present

## 2020-06-27 ENCOUNTER — Other Ambulatory Visit: Payer: Self-pay

## 2020-06-27 ENCOUNTER — Encounter (HOSPITAL_COMMUNITY): Payer: Self-pay | Admitting: Vascular Surgery

## 2020-06-27 ENCOUNTER — Encounter: Payer: Self-pay | Admitting: Vascular Surgery

## 2020-06-27 ENCOUNTER — Ambulatory Visit (INDEPENDENT_AMBULATORY_CARE_PROVIDER_SITE_OTHER): Payer: Medicare Other | Admitting: Vascular Surgery

## 2020-06-27 ENCOUNTER — Other Ambulatory Visit (HOSPITAL_COMMUNITY)
Admission: RE | Admit: 2020-06-27 | Discharge: 2020-06-27 | Disposition: A | Discharge status: Home / Self Care | Payer: Medicare Other | Source: Ambulatory Visit | Attending: Vascular Surgery | Admitting: Vascular Surgery

## 2020-06-27 ENCOUNTER — Other Ambulatory Visit: Payer: Self-pay | Admitting: *Deleted

## 2020-06-27 VITALS — BP 170/85 | HR 91 | Temp 97.8°F | Resp 12 | Ht 73.0 in | Wt 127.0 lb

## 2020-06-27 DIAGNOSIS — I96 Gangrene, not elsewhere classified: Secondary | ICD-10-CM

## 2020-06-27 DIAGNOSIS — Z20822 Contact with and (suspected) exposure to covid-19: Secondary | ICD-10-CM | POA: Insufficient documentation

## 2020-06-27 DIAGNOSIS — I739 Peripheral vascular disease, unspecified: Secondary | ICD-10-CM

## 2020-06-27 DIAGNOSIS — Z01812 Encounter for preprocedural laboratory examination: Secondary | ICD-10-CM | POA: Insufficient documentation

## 2020-06-27 NOTE — Progress Notes (Signed)
[facility] and [facility] Specialist of [place]  Patient name: Allen Yu MRN: [id number] DOB: [birth year] Sex: male  REASON FOR VISIT: Follow-up gangrenous changes left foot  HPI: Allen Yu is a 75 y.o. male here today for follow-up.  He had presented with critical limb ischemia and a heel ulcer.  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  He underwent left femoral to above-knee popliteal bypass with Gore-Tex on 05/27/2020.  He had extensive popliteal disease.  His vein was harvested in his entirety and was not usable for bypass and therefore underwent bypass to the popliteal above the occlusion.  Unfortunately is continued to have progressive gangrenous changes of his heel and is now having blistering over the dorsum of his foot and the toes of his foot.  He has been having persistent severe rest pain.  Current Outpatient Medications  Medication Sig Dispense Refill  . aspirin EC 81 MG tablet Take 1 tablet (81 mg total) by mouth at bedtime. 90 tablet 0  . Aspirin-Caffeine (BC FAST PAIN RELIEF PO) Take 1 packet by mouth daily as needed (pain).    [name] atorvastatin (LIPITOR) 40 MG tablet Take 1 tablet (40 mg total) by mouth at bedtime. 90 tablet 0  . B-D UF III MINI PEN NEEDLES 31G X 5 MM MISC SMARTSIG:1 Each SUB-Q Daily    . donepezil (ARICEPT) 10 MG tablet Take 10 mg by mouth at bedtime.     . gabapentin (NEURONTIN) 100 MG capsule Take 100 mg by mouth at bedtime.    . insulin lispro (HUMALOG) 100 UNIT/ML injection Inject 5-10 Units into the skin daily as needed for high blood sugar.     [name] LANTUS 100 UNIT/ML injection Inject 60 Units into the skin at bedtime.     [name] oxyCODONE (OXY IR/ROXICODONE) 5 MG immediate release tablet Take 5 mg by mouth every 6 (six) hours as needed.    [name] oxyCODONE-acetaminophen (PERCOCET/ROXICET) 5-325 MG tablet Take 1 tablet by mouth every 6 (six) hours as needed for moderate pain. 30 tablet 0  .  oxyCODONE-acetaminophen (PERCOCET/ROXICET) 5-325 MG tablet Take 1 tablet by mouth every 4 (four) hours as needed for severe pain. 30 tablet 0   No current facility-administered medications for this visit.     PHYSICAL EXAM: Vitals:   06/27/20 0852  BP: (!) 170/85  Pulse: 91  Resp: 12  Temp: 97.8 F (36.6 C)  TempSrc: Other (Comment)  SpO2: 96%  Weight: 127 lb (57.6 kg)  Height: 6\' 1"  (1.854 m)    GENERAL: The patient is a well-nourished male, in no acute distress. The vital signs are documented above. Surgical incisions are healing.  Moderate swelling in his left foot.  Progressive gangrene of his heel ulcer and also blistering on his toes  MEDICAL ISSUES: I discussed this at length with the patient and his wife present.  He does not have any option for limb salvage.  I discussed the option of below-knee amputation with very marginal circulation to heal this versus above-knee amputation with very high likelihood of success.  I would recommend above-knee amputation and they agree to this recommendation.  He is quite uncomfortable.  He was able to transfer from bed to chair and walk a bit prior to developing his heel ulceration but certainly in a debilitated state.  I explained the surgery and postoperative care with physical therapy and rehab involvement.  We will plan for surgery tomorrow 06/28/2020 at [facility].  Jenise Iannelli, MD FACS [facility] of [place] Office Tel [phone]  Note: Portions of this report may have been transcribed using voice recognition software.  Every effort has been made to ensure accuracy; however, inadvertent computerized transcription errors may still be present.

## 2020-06-27 NOTE — Progress Notes (Signed)
I spoke to Mrs Allen Yu, with [name] listening to help with information if needed.  [name] denies chest pain or shortness of breath. [name] was tested for Covid today.  [name] has type II DM, patient takes Lantus 2 times a day if needed for CBG > 119. Mrs Allen Yu reports that she does not see patient checking CBG. I instructed that if C BG > 119 tonight and in the am that patient should take 1/2 of the Lantus dose = 30 units.  I instructed patient to check CBG after awaking and every 2 hours until arrival  to the hospital.  I Instructed patient if CBG is less than 70 to drink  1/2 cup of a clear juice. Recheck CBG in 15 minutes if CBG is not over 70 call, pre- op desk at [phone] for further instructions. If scheduled to receive Insulin, do not take Insulin

## 2020-06-27 NOTE — H&P (View-Only) (Signed)
[facility] Specialist of [place]  Patient name: Allen Yu MRN: [id number] DOB: 1945/07/07 Sex: male  REASON FOR VISIT: Follow-up gangrenous changes left foot  HPI: Allen Yu is a 75 y.o. male here today for follow-up.  He had presented with critical limb ischemia and a heel ulcer.  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  He underwent left femoral to above-knee popliteal bypass with Gore-Tex on 05/27/2020.  He had extensive popliteal disease.  His vein was harvested in his entirety and was not usable for bypass and therefore underwent bypass to the popliteal above the occlusion.  Unfortunately is continued to have progressive gangrenous changes of his heel and is now having blistering over the dorsum of his foot and the toes of his foot.  He has been having persistent severe rest pain.  Current Outpatient Medications  Medication Sig Dispense Refill  . aspirin EC 81 MG tablet Take 1 tablet (81 mg total) by mouth at bedtime. 90 tablet 0  . Aspirin-Caffeine (BC FAST PAIN RELIEF PO) Take 1 packet by mouth daily as needed (pain).    [name] atorvastatin (LIPITOR) 40 MG tablet Take 1 tablet (40 mg total) by mouth at bedtime. 90 tablet 0  . B-D UF III MINI PEN NEEDLES 31G X 5 MM MISC SMARTSIG:1 Each SUB-Q Daily    . donepezil (ARICEPT) 10 MG tablet Take 10 mg by mouth at bedtime.     . gabapentin (NEURONTIN) 100 MG capsule Take 100 mg by mouth at bedtime.    . insulin lispro (HUMALOG) 100 UNIT/ML injection Inject 5-10 Units into the skin daily as needed for high blood sugar.     [name] LANTUS 100 UNIT/ML injection Inject 60 Units into the skin at bedtime.     [name] oxyCODONE (OXY IR/ROXICODONE) 5 MG immediate release tablet Take 5 mg by mouth every 6 (six) hours as needed.    [name] oxyCODONE-acetaminophen (PERCOCET/ROXICET) 5-325 MG tablet Take 1 tablet by mouth every 6 (six) hours as needed for moderate pain. 30 tablet 0  .  oxyCODONE-acetaminophen (PERCOCET/ROXICET) 5-325 MG tablet Take 1 tablet by mouth every 4 (four) hours as needed for severe pain. 30 tablet 0   No current facility-administered medications for this visit.     PHYSICAL EXAM: Vitals:   06/27/20 0852  BP: (!) 170/85  Pulse: 91  Resp: 12  Temp: 97.8 F (36.6 C)  TempSrc: Other (Comment)  SpO2: 96%  Weight: 127 lb (57.6 kg)  Height: 6\' 1"  (1.854 m)    GENERAL: The patient is a well-nourished male, in no acute distress. The vital signs are documented above. Surgical incisions are healing.  Moderate swelling in his left foot.  Progressive gangrene of his heel ulcer and also blistering on his toes  MEDICAL ISSUES: I discussed this at length with the patient and his wife present.  He does not have any option for limb salvage.  I discussed the option of below-knee amputation with very marginal circulation to heal this versus above-knee amputation with very high likelihood of success.  I would recommend above-knee amputation and they agree to this recommendation.  He is quite uncomfortable.  He was able to transfer from bed to chair and walk a bit prior to developing his heel ulceration but certainly in a debilitated state.  I explained the surgery and postoperative care with physical therapy and rehab involvement.  We will plan for surgery tomorrow 06/28/2020 at [facility].  Nedim Oki, MD FACS [facility] of [place] Office Tel [phone]  Note: Portions of this report may have been transcribed using voice recognition software.  Every effort has been made to ensure accuracy; however, inadvertent computerized transcription errors may still be present.

## 2020-06-28 ENCOUNTER — Inpatient Hospital Stay (HOSPITAL_COMMUNITY)
Admission: RE | Admit: 2020-06-28 | Discharge: 2020-06-30 | DRG: 853 | Disposition: A | Discharge status: Home Health | Payer: Medicare Other | Attending: Student | Admitting: Student

## 2020-06-28 ENCOUNTER — Inpatient Hospital Stay (HOSPITAL_COMMUNITY): Payer: Medicare Other | Admitting: Certified Registered"

## 2020-06-28 ENCOUNTER — Encounter (HOSPITAL_COMMUNITY): Payer: Self-pay | Admitting: Vascular Surgery

## 2020-06-28 ENCOUNTER — Encounter (HOSPITAL_COMMUNITY)
Admission: RE | Disposition: A | Discharge status: Home Health | Payer: Self-pay | Source: Home / Self Care | Attending: Internal Medicine

## 2020-06-28 DIAGNOSIS — E785 Hyperlipidemia, unspecified: Secondary | ICD-10-CM | POA: Diagnosis present

## 2020-06-28 DIAGNOSIS — E1122 Type 2 diabetes mellitus with diabetic chronic kidney disease: Secondary | ICD-10-CM | POA: Diagnosis present

## 2020-06-28 DIAGNOSIS — I4891 Unspecified atrial fibrillation: Secondary | ICD-10-CM | POA: Diagnosis not present

## 2020-06-28 DIAGNOSIS — Z8615 Personal history of latent tuberculosis infection: Secondary | ICD-10-CM

## 2020-06-28 DIAGNOSIS — F17210 Nicotine dependence, cigarettes, uncomplicated: Secondary | ICD-10-CM | POA: Diagnosis present

## 2020-06-28 DIAGNOSIS — F17200 Nicotine dependence, unspecified, uncomplicated: Secondary | ICD-10-CM | POA: Diagnosis present

## 2020-06-28 DIAGNOSIS — I70262 Atherosclerosis of native arteries of extremities with gangrene, left leg: Secondary | ICD-10-CM | POA: Diagnosis present

## 2020-06-28 DIAGNOSIS — Z681 Body mass index (BMI) 19 or less, adult: Secondary | ICD-10-CM | POA: Diagnosis not present

## 2020-06-28 DIAGNOSIS — Z89612 Acquired absence of left leg above knee: Secondary | ICD-10-CM | POA: Diagnosis not present

## 2020-06-28 DIAGNOSIS — Z20822 Contact with and (suspected) exposure to covid-19: Secondary | ICD-10-CM | POA: Diagnosis present

## 2020-06-28 DIAGNOSIS — I70229 Atherosclerosis of native arteries of extremities with rest pain, unspecified extremity: Secondary | ICD-10-CM | POA: Diagnosis not present

## 2020-06-28 DIAGNOSIS — Z794 Long term (current) use of insulin: Secondary | ICD-10-CM

## 2020-06-28 DIAGNOSIS — E1152 Type 2 diabetes mellitus with diabetic peripheral angiopathy with gangrene: Secondary | ICD-10-CM | POA: Diagnosis present

## 2020-06-28 DIAGNOSIS — I480 Paroxysmal atrial fibrillation: Secondary | ICD-10-CM | POA: Diagnosis not present

## 2020-06-28 DIAGNOSIS — Z8249 Family history of ischemic heart disease and other diseases of the circulatory system: Secondary | ICD-10-CM

## 2020-06-28 DIAGNOSIS — D631 Anemia in chronic kidney disease: Secondary | ICD-10-CM | POA: Diagnosis present

## 2020-06-28 DIAGNOSIS — D75838 Other thrombocytosis: Secondary | ICD-10-CM | POA: Diagnosis present

## 2020-06-28 DIAGNOSIS — N179 Acute kidney failure, unspecified: Secondary | ICD-10-CM | POA: Diagnosis present

## 2020-06-28 DIAGNOSIS — E46 Unspecified protein-calorie malnutrition: Secondary | ICD-10-CM | POA: Diagnosis present

## 2020-06-28 DIAGNOSIS — I96 Gangrene, not elsewhere classified: Secondary | ICD-10-CM | POA: Diagnosis present

## 2020-06-28 DIAGNOSIS — E11621 Type 2 diabetes mellitus with foot ulcer: Secondary | ICD-10-CM | POA: Diagnosis present

## 2020-06-28 DIAGNOSIS — I129 Hypertensive chronic kidney disease with stage 1 through stage 4 chronic kidney disease, or unspecified chronic kidney disease: Secondary | ICD-10-CM | POA: Diagnosis present

## 2020-06-28 DIAGNOSIS — E43 Unspecified severe protein-calorie malnutrition: Secondary | ICD-10-CM | POA: Diagnosis present

## 2020-06-28 DIAGNOSIS — F0390 Unspecified dementia without behavioral disturbance: Secondary | ICD-10-CM | POA: Diagnosis present

## 2020-06-28 DIAGNOSIS — E1151 Type 2 diabetes mellitus with diabetic peripheral angiopathy without gangrene: Secondary | ICD-10-CM

## 2020-06-28 DIAGNOSIS — E11649 Type 2 diabetes mellitus with hypoglycemia without coma: Secondary | ICD-10-CM | POA: Diagnosis not present

## 2020-06-28 DIAGNOSIS — L97429 Non-pressure chronic ulcer of left heel and midfoot with unspecified severity: Secondary | ICD-10-CM | POA: Diagnosis present

## 2020-06-28 DIAGNOSIS — D649 Anemia, unspecified: Secondary | ICD-10-CM | POA: Diagnosis not present

## 2020-06-28 DIAGNOSIS — D62 Acute posthemorrhagic anemia: Secondary | ICD-10-CM | POA: Diagnosis present

## 2020-06-28 DIAGNOSIS — I160 Hypertensive urgency: Secondary | ICD-10-CM | POA: Diagnosis present

## 2020-06-28 DIAGNOSIS — E1165 Type 2 diabetes mellitus with hyperglycemia: Secondary | ICD-10-CM | POA: Diagnosis present

## 2020-06-28 DIAGNOSIS — N189 Chronic kidney disease, unspecified: Secondary | ICD-10-CM | POA: Diagnosis present

## 2020-06-28 DIAGNOSIS — R64 Cachexia: Secondary | ICD-10-CM | POA: Diagnosis present

## 2020-06-28 DIAGNOSIS — F0391 Unspecified dementia with behavioral disturbance: Secondary | ICD-10-CM | POA: Diagnosis present

## 2020-06-28 DIAGNOSIS — R911 Solitary pulmonary nodule: Secondary | ICD-10-CM | POA: Diagnosis not present

## 2020-06-28 DIAGNOSIS — Z7982 Long term (current) use of aspirin: Secondary | ICD-10-CM | POA: Diagnosis not present

## 2020-06-28 DIAGNOSIS — L97129 Non-pressure chronic ulcer of left thigh with unspecified severity: Secondary | ICD-10-CM | POA: Diagnosis not present

## 2020-06-28 DIAGNOSIS — A419 Sepsis, unspecified organism: Secondary | ICD-10-CM | POA: Diagnosis present

## 2020-06-28 DIAGNOSIS — Z79899 Other long term (current) drug therapy: Secondary | ICD-10-CM | POA: Diagnosis not present

## 2020-06-28 DIAGNOSIS — Z9114 Patient's other noncompliance with medication regimen: Secondary | ICD-10-CM

## 2020-06-28 DIAGNOSIS — K219 Gastro-esophageal reflux disease without esophagitis: Secondary | ICD-10-CM | POA: Diagnosis present

## 2020-06-28 DIAGNOSIS — N182 Chronic kidney disease, stage 2 (mild): Secondary | ICD-10-CM | POA: Diagnosis not present

## 2020-06-28 DIAGNOSIS — I70241 Atherosclerosis of native arteries of left leg with ulceration of thigh: Secondary | ICD-10-CM | POA: Diagnosis not present

## 2020-06-28 DIAGNOSIS — D638 Anemia in other chronic diseases classified elsewhere: Secondary | ICD-10-CM | POA: Diagnosis present

## 2020-06-28 DIAGNOSIS — J90 Pleural effusion, not elsewhere classified: Secondary | ICD-10-CM | POA: Diagnosis not present

## 2020-06-28 DIAGNOSIS — F4310 Post-traumatic stress disorder, unspecified: Secondary | ICD-10-CM | POA: Diagnosis present

## 2020-06-28 HISTORY — DX: Personal history of urinary calculi: Z87.442

## 2020-06-28 HISTORY — DX: Peripheral vascular disease, unspecified: I73.9

## 2020-06-28 HISTORY — DX: Constipation, unspecified: K59.00

## 2020-06-28 HISTORY — PX: AMPUTATION: SHX166

## 2020-06-28 LAB — CBC
HCT: 24.8 % — ABNORMAL LOW (ref 39.0–52.0)
Hemoglobin: 8.2 g/dL — ABNORMAL LOW (ref 13.0–17.0)
MCH: 29.8 pg (ref 26.0–34.0)
MCHC: 33.1 g/dL (ref 30.0–36.0)
MCV: 90.2 fL (ref 80.0–100.0)
Platelets: 411 10*3/uL — ABNORMAL HIGH (ref 150–400)
RBC: 2.75 MIL/uL — ABNORMAL LOW (ref 4.22–5.81)
RDW: 13.6 % (ref 11.5–15.5)
WBC: 12.4 10*3/uL — ABNORMAL HIGH (ref 4.0–10.5)
nRBC: 0 % (ref 0.0–0.2)

## 2020-06-28 LAB — GLUCOSE, CAPILLARY
Glucose-Capillary: 410 mg/dL — ABNORMAL HIGH (ref 70–99)
Glucose-Capillary: 374 mg/dL — ABNORMAL HIGH (ref 70–99)
Glucose-Capillary: 386 mg/dL — ABNORMAL HIGH (ref 70–99)
Glucose-Capillary: 230 mg/dL — ABNORMAL HIGH (ref 70–99)
Glucose-Capillary: 277 mg/dL — ABNORMAL HIGH (ref 70–99)
Glucose-Capillary: 149 mg/dL — ABNORMAL HIGH (ref 70–99)
Glucose-Capillary: 144 mg/dL — ABNORMAL HIGH (ref 70–99)
Glucose-Capillary: 119 mg/dL — ABNORMAL HIGH (ref 70–99)
Glucose-Capillary: 228 mg/dL — ABNORMAL HIGH (ref 70–99)
Glucose-Capillary: 209 mg/dL — ABNORMAL HIGH (ref 70–99)

## 2020-06-28 LAB — COMPREHENSIVE METABOLIC PANEL
ALT: 17 U/L (ref 0–44)
AST: 23 U/L (ref 15–41)
Albumin: 2.7 g/dL — ABNORMAL LOW (ref 3.5–5.0)
Alkaline Phosphatase: 152 U/L — ABNORMAL HIGH (ref 38–126)
Anion gap: 7 (ref 5–15)
BUN: 12 mg/dL (ref 8–23)
CO2: 29 mmol/L (ref 22–32)
Calcium: 9 mg/dL (ref 8.9–10.3)
Chloride: 98 mmol/L (ref 98–111)
Creatinine, Ser: 1.37 mg/dL — ABNORMAL HIGH (ref 0.61–1.24)
GFR, Estimated: 54 mL/min — ABNORMAL LOW (ref >60)
Glucose, Bld: 392 mg/dL — ABNORMAL HIGH (ref 70–99)
Potassium: 3.8 mmol/L (ref 3.5–5.1)
Sodium: 134 mmol/L — ABNORMAL LOW (ref 135–145)
Total Bilirubin: 0.7 mg/dL (ref 0.3–1.2)
Total Protein: 6.4 g/dL — ABNORMAL LOW (ref 6.5–8.1)

## 2020-06-28 LAB — HEMOGLOBIN AND HEMATOCRIT, BLOOD
HCT: 20.2 % — ABNORMAL LOW (ref 39.0–52.0)
Hemoglobin: 6.9 g/dL — CL (ref 13.0–17.0)

## 2020-06-28 LAB — APTT: aPTT: 33 seconds (ref 24–36)

## 2020-06-28 LAB — PROTIME-INR
INR: 1.1 (ref 0.8–1.2)
Prothrombin Time: 14.2 seconds (ref 11.4–15.2)

## 2020-06-28 LAB — SARS CORONAVIRUS 2 (TAT 6-24 HRS): SARS Coronavirus 2: NEGATIVE

## 2020-06-28 LAB — PREPARE RBC (CROSSMATCH)

## 2020-06-28 SURGERY — AMPUTATION, ABOVE KNEE
Anesthesia: General | Site: Knee | Laterality: Left

## 2020-06-28 MED ORDER — PROPOFOL 10 MG/ML IV BOLUS
INTRAVENOUS | Status: AC
  Filled 2020-06-28: qty 40

## 2020-06-28 MED ORDER — MORPHINE SULFATE (PF) 2 MG/ML IV SOLN
2.0000 mg | INTRAVENOUS | Status: DC | PRN
  Administered 2020-06-28: 2 mg via INTRAVENOUS
  Filled 2020-06-28: qty 1

## 2020-06-28 MED ORDER — HYDROMORPHONE HCL 1 MG/ML IJ SOLN
INTRAMUSCULAR | Status: AC
  Filled 2020-06-28: qty 1

## 2020-06-28 MED ORDER — LOSARTAN POTASSIUM 50 MG PO TABS
50.0000 mg | ORAL_TABLET | Freq: Every day | ORAL | Status: DC
  Administered 2020-06-28 – 2020-06-29 (×2): 50 mg via ORAL
  Filled 2020-06-28 (×2): qty 1

## 2020-06-28 MED ORDER — PROMETHAZINE HCL 25 MG/ML IJ SOLN: 6.2500 mg | INTRAMUSCULAR | Status: DC | PRN

## 2020-06-28 MED ORDER — INSULIN REGULAR(HUMAN) IN NACL 100-0.9 UT/100ML-% IV SOLN
INTRAVENOUS | Status: DC
  Administered 2020-06-28: 10 [IU]/h via INTRAVENOUS
  Filled 2020-06-28 (×2): qty 100

## 2020-06-28 MED ORDER — ORAL CARE MOUTH RINSE: 15.0000 mL | Freq: Once | OROMUCOSAL | Status: AC

## 2020-06-28 MED ORDER — ACETAMINOPHEN 325 MG PO TABS: 325.0000 mg | ORAL_TABLET | Freq: Once | ORAL | Status: DC | PRN

## 2020-06-28 MED ORDER — OXYCODONE-ACETAMINOPHEN 5-325 MG PO TABS: 1.0000 | ORAL_TABLET | ORAL | Status: DC | PRN

## 2020-06-28 MED ORDER — GABAPENTIN 100 MG PO CAPS
100.0000 mg | ORAL_CAPSULE | Freq: Every day | ORAL | Status: DC
  Administered 2020-06-28 – 2020-06-29 (×2): 100 mg via ORAL
  Filled 2020-06-28 (×2): qty 1

## 2020-06-28 MED ORDER — CHLORHEXIDINE GLUCONATE 0.12 % MT SOLN
15.0000 mL | Freq: Once | OROMUCOSAL | Status: AC
  Administered 2020-06-28: 15 mL via OROMUCOSAL
  Filled 2020-06-28: qty 15

## 2020-06-28 MED ORDER — ACETAMINOPHEN 325 MG PO TABS: 650.0000 mg | ORAL_TABLET | Freq: Four times a day (QID) | ORAL | Status: DC | PRN

## 2020-06-28 MED ORDER — ONDANSETRON HCL 4 MG/2ML IJ SOLN
INTRAMUSCULAR | Status: AC
  Filled 2020-06-28: qty 2

## 2020-06-28 MED ORDER — HYDRALAZINE HCL 20 MG/ML IJ SOLN
10.0000 mg | INTRAMUSCULAR | Status: DC | PRN
  Administered 2020-06-28: 10 mg via INTRAVENOUS
  Filled 2020-06-28: qty 1

## 2020-06-28 MED ORDER — ENSURE ENLIVE PO LIQD
237.0000 mL | Freq: Two times a day (BID) | ORAL | Status: DC
  Administered 2020-06-30: 237 mL via ORAL

## 2020-06-28 MED ORDER — INSULIN ASPART 100 UNIT/ML IJ SOLN
0.0000 [IU] | Freq: Every day | INTRAMUSCULAR | Status: DC
  Administered 2020-06-28: 2 [IU] via SUBCUTANEOUS

## 2020-06-28 MED ORDER — CEFAZOLIN SODIUM-DEXTROSE 2-4 GM/100ML-% IV SOLN
2.0000 g | INTRAVENOUS | Status: AC
  Administered 2020-06-28: 2 g via INTRAVENOUS
  Filled 2020-06-28: qty 100

## 2020-06-28 MED ORDER — FENTANYL CITRATE (PF) 250 MCG/5ML IJ SOLN
INTRAMUSCULAR | Status: AC
  Filled 2020-06-28: qty 5

## 2020-06-28 MED ORDER — INSULIN ASPART 100 UNIT/ML IJ SOLN
INTRAMUSCULAR | Status: AC
  Administered 2020-06-28: 10 [IU] via SUBCUTANEOUS
  Filled 2020-06-28: qty 1

## 2020-06-28 MED ORDER — SODIUM CHLORIDE 0.9 % IV SOLN: INTRAVENOUS | Status: DC

## 2020-06-28 MED ORDER — PROPOFOL 10 MG/ML IV BOLUS
INTRAVENOUS | Status: DC | PRN
  Administered 2020-06-28: 150 mg via INTRAVENOUS

## 2020-06-28 MED ORDER — NICOTINE 21 MG/24HR TD PT24: 21.0000 mg | MEDICATED_PATCH | Freq: Every day | TRANSDERMAL | Status: DC | PRN

## 2020-06-28 MED ORDER — DEXTROSE 50 % IV SOLN
0.0000 mL | INTRAVENOUS | Status: DC | PRN
  Filled 2020-06-28: qty 50

## 2020-06-28 MED ORDER — CHLORHEXIDINE GLUCONATE CLOTH 2 % EX PADS: 6.0000 | MEDICATED_PAD | Freq: Once | CUTANEOUS | Status: DC

## 2020-06-28 MED ORDER — INSULIN GLARGINE 100 UNIT/ML ~~LOC~~ SOLN
45.0000 [IU] | Freq: Every day | SUBCUTANEOUS | Status: DC
  Administered 2020-06-28: 45 [IU] via SUBCUTANEOUS
  Filled 2020-06-28 (×2): qty 0.45

## 2020-06-28 MED ORDER — LIDOCAINE HCL (PF) 2 % IJ SOLN
INTRAMUSCULAR | Status: AC
  Filled 2020-06-28: qty 10

## 2020-06-28 MED ORDER — ACETAMINOPHEN 650 MG RE SUPP: 650.0000 mg | Freq: Four times a day (QID) | RECTAL | Status: DC | PRN

## 2020-06-28 MED ORDER — ADULT MULTIVITAMIN W/MINERALS CH
1.0000 | ORAL_TABLET | Freq: Every day | ORAL | Status: DC
  Administered 2020-06-28 – 2020-06-30 (×3): 1 via ORAL
  Filled 2020-06-28 (×3): qty 1

## 2020-06-28 MED ORDER — ONDANSETRON HCL 4 MG/2ML IJ SOLN
INTRAMUSCULAR | Status: DC | PRN
  Administered 2020-06-28: 4 mg via INTRAVENOUS

## 2020-06-28 MED ORDER — INSULIN ASPART 100 UNIT/ML IJ SOLN: 10.0000 [IU] | Freq: Once | INTRAMUSCULAR | Status: AC

## 2020-06-28 MED ORDER — FENTANYL CITRATE (PF) 250 MCG/5ML IJ SOLN
INTRAMUSCULAR | Status: DC | PRN
  Administered 2020-06-28 (×4): 25 ug via INTRAVENOUS

## 2020-06-28 MED ORDER — SODIUM CHLORIDE 0.9% IV SOLUTION: Freq: Once | INTRAVENOUS | Status: AC

## 2020-06-28 MED ORDER — 0.9 % SODIUM CHLORIDE (POUR BTL) OPTIME
TOPICAL | Status: DC | PRN
  Administered 2020-06-28: 1000 mL

## 2020-06-28 MED ORDER — AMISULPRIDE (ANTIEMETIC) 5 MG/2ML IV SOLN: 10.0000 mg | Freq: Once | INTRAVENOUS | Status: DC | PRN

## 2020-06-28 MED ORDER — DONEPEZIL HCL 5 MG PO TABS
10.0000 mg | ORAL_TABLET | Freq: Every day | ORAL | Status: DC
  Administered 2020-06-28 – 2020-06-29 (×2): 10 mg via ORAL
  Filled 2020-06-28 (×2): qty 2

## 2020-06-28 MED ORDER — INSULIN ASPART 100 UNIT/ML IJ SOLN
0.0000 [IU] | Freq: Three times a day (TID) | INTRAMUSCULAR | Status: DC
  Administered 2020-06-28: 5 [IU] via SUBCUTANEOUS

## 2020-06-28 MED ORDER — LACTATED RINGERS IV SOLN: INTRAVENOUS | Status: DC

## 2020-06-28 MED ORDER — SODIUM CHLORIDE 0.9% FLUSH
3.0000 mL | Freq: Two times a day (BID) | INTRAVENOUS | Status: DC
  Administered 2020-06-28 – 2020-06-30 (×4): 3 mL via INTRAVENOUS

## 2020-06-28 MED ORDER — SODIUM CHLORIDE 0.9 % IV SOLN
INTRAVENOUS | Status: DC | PRN
  Administered 2020-06-28: 25 ug/min via INTRAVENOUS
  Administered 2020-06-28: 20 ug/min via INTRAVENOUS

## 2020-06-28 MED ORDER — ACETAMINOPHEN 160 MG/5ML PO SOLN: 325.0000 mg | Freq: Once | ORAL | Status: DC | PRN

## 2020-06-28 MED ORDER — ACETAMINOPHEN 10 MG/ML IV SOLN
INTRAVENOUS | Status: AC
  Filled 2020-06-28: qty 100

## 2020-06-28 MED ORDER — OXYCODONE-ACETAMINOPHEN 5-325 MG PO TABS
1.0000 | ORAL_TABLET | ORAL | Status: DC | PRN
  Administered 2020-06-28 – 2020-06-30 (×5): 2 via ORAL
  Filled 2020-06-28 (×5): qty 2

## 2020-06-28 MED ORDER — LIDOCAINE HCL (PF) 2 % IJ SOLN
INTRAMUSCULAR | Status: DC | PRN
  Administered 2020-06-28: 40 mg via INTRADERMAL

## 2020-06-28 MED ORDER — HYDROMORPHONE HCL 1 MG/ML IJ SOLN
0.2500 mg | INTRAMUSCULAR | Status: DC | PRN
  Administered 2020-06-28 (×4): 0.5 mg via INTRAVENOUS

## 2020-06-28 MED ORDER — ATORVASTATIN CALCIUM 40 MG PO TABS
40.0000 mg | ORAL_TABLET | Freq: Every day | ORAL | Status: DC
  Administered 2020-06-28 – 2020-06-29 (×2): 40 mg via ORAL
  Filled 2020-06-28 (×2): qty 1

## 2020-06-28 MED ORDER — ACETAMINOPHEN 10 MG/ML IV SOLN
1000.0000 mg | Freq: Once | INTRAVENOUS | Status: DC | PRN
  Administered 2020-06-28: 1000 mg via INTRAVENOUS

## 2020-06-28 SURGICAL SUPPLY — 47 items
BANDAGE ESMARK 6X9 LF (GAUZE/BANDAGES/DRESSINGS) IMPLANT
BLADE SAW GIGLI 510 (BLADE) ×2 IMPLANT
BNDG CMPR 9X6 STRL LF SNTH (GAUZE/BANDAGES/DRESSINGS)
BNDG CMPR MED 10X6 ELC LF (GAUZE/BANDAGES/DRESSINGS) ×1
BNDG ELASTIC 4X5.8 VLCR STR LF (GAUZE/BANDAGES/DRESSINGS) ×2 IMPLANT
BNDG ELASTIC 6X10 VLCR STRL LF (GAUZE/BANDAGES/DRESSINGS) ×1 IMPLANT
BNDG ELASTIC 6X5.8 VLCR STR LF (GAUZE/BANDAGES/DRESSINGS) ×2 IMPLANT
BNDG ESMARK 6X9 LF (GAUZE/BANDAGES/DRESSINGS)
BNDG GAUZE ELAST 4 BULKY (GAUZE/BANDAGES/DRESSINGS) ×3 IMPLANT
BOOT SUTURE AID YELLOW STND (SUTURE) ×1 IMPLANT
CANISTER SUCT 3000ML PPV (MISCELLANEOUS) ×2 IMPLANT
CLIP LIGATING EXTRA MED SLVR (CLIP) ×2 IMPLANT
CLIP LIGATING EXTRA SM BLUE (MISCELLANEOUS) ×2 IMPLANT
COVER SURGICAL LIGHT HANDLE (MISCELLANEOUS) ×4 IMPLANT
CUFF TOURN SGL QUICK 34 (TOURNIQUET CUFF)
CUFF TOURN SGL QUICK 42 (TOURNIQUET CUFF) IMPLANT
CUFF TRNQT CYL 34X4.125X (TOURNIQUET CUFF) IMPLANT
DRAIN SNY 10X20 3/4 PERF (WOUND CARE) IMPLANT
DRAPE HALF SHEET 40X57 (DRAPES) ×2 IMPLANT
DRAPE ORTHO SPLIT 77X108 STRL (DRAPES) ×4
DRAPE SURG ORHT 6 SPLT 77X108 (DRAPES) ×2 IMPLANT
DRSG XEROFORM 1X8 (GAUZE/BANDAGES/DRESSINGS) ×1 IMPLANT
ELECT CAUTERY BLADE 6.4 (BLADE) ×2 IMPLANT
ELECT REM PT RETURN 9FT ADLT (ELECTROSURGICAL) ×2
ELECTRODE REM PT RTRN 9FT ADLT (ELECTROSURGICAL) ×1 IMPLANT
EVACUATOR SILICONE 100CC (DRAIN) IMPLANT
GAUZE SPONGE 4X4 12PLY STRL (GAUZE/BANDAGES/DRESSINGS) ×3 IMPLANT
GLOVE SS BIOGEL STRL SZ 7.5 (GLOVE) ×1 IMPLANT
GLOVE SUPERSENSE BIOGEL SZ 7.5 (GLOVE) ×1
GOWN STRL REUS W/ TWL LRG LVL3 (GOWN DISPOSABLE) ×3 IMPLANT
GOWN STRL REUS W/TWL LRG LVL3 (GOWN DISPOSABLE) ×6
KIT BASIN OR (CUSTOM PROCEDURE TRAY) ×2 IMPLANT
KIT TURNOVER KIT B (KITS) ×2 IMPLANT
NS IRRIG 1000ML POUR BTL (IV SOLUTION) ×2 IMPLANT
PACK GENERAL/GYN (CUSTOM PROCEDURE TRAY) ×2 IMPLANT
PAD ARMBOARD 7.5X6 YLW CONV (MISCELLANEOUS) ×4 IMPLANT
PADDING CAST COTTON 6X4 STRL (CAST SUPPLIES) IMPLANT
STAPLER VISISTAT 35W (STAPLE) ×2 IMPLANT
STOCKINETTE IMPERVIOUS LG (DRAPES) ×2 IMPLANT
SUT ETHILON 3 0 PS 1 (SUTURE) IMPLANT
SUT PROLENE 5 0 C 1 36 (SUTURE) ×1 IMPLANT
SUT VIC AB 0 CT1 18XCR BRD 8 (SUTURE) ×2 IMPLANT
SUT VIC AB 0 CT1 8-18 (SUTURE) ×4
SUT VICRYL AB 2 0 TIES (SUTURE) ×2 IMPLANT
TOWEL GREEN STERILE (TOWEL DISPOSABLE) ×4 IMPLANT
UNDERPAD 30X36 HEAVY ABSORB (UNDERPADS AND DIAPERS) ×4 IMPLANT
WATER STERILE IRR 1000ML POUR (IV SOLUTION) ×2 IMPLANT

## 2020-06-28 NOTE — Anesthesia Postprocedure Evaluation (Signed)
Anesthesia Post Note  Patient: Allen Yu  Procedure(s) Performed: AMPUTATION ABOVE KNEE LEFT (Left Knee)     Patient location during evaluation: PACU Anesthesia Type: General Level of consciousness: awake and alert Pain management: pain level controlled Vital Signs Assessment: post-procedure vital signs reviewed and stable Respiratory status: spontaneous breathing, nonlabored ventilation, respiratory function stable and patient connected to nasal cannula oxygen Cardiovascular status: blood pressure returned to baseline and stable Postop Assessment: no apparent nausea or vomiting Anesthetic complications: no   No complications documented.  Last Vitals:  Vitals:   06/28/20 1030 06/28/20 1046  BP: (!) 167/77 (!) 170/81  Pulse: 61   Resp: 17 16  Temp: 37.1 C (!) 36.4 C  SpO2: 100% 100%    Last Pain:  Vitals:   06/28/20 1046  TempSrc: Oral  PainSc:                  Effie Berkshire

## 2020-06-28 NOTE — Progress Notes (Signed)
Following his surgery hemoglobin was noted to be 6.9. -Transfuse 1 unit of packed red blood cells and orders placed to recheck H&H 2 hours posttransfusion.

## 2020-06-28 NOTE — Progress Notes (Signed)
Pt on endotool insulin drip. CBG 149. Notified Dr. Smith Robert with anesthesia and was instructed to turn insulin drip off.   Gabriel Rainwater, RN

## 2020-06-28 NOTE — Anesthesia Preprocedure Evaluation (Addendum)
Anesthesia Evaluation  Patient identified by MRN, date of birth, ID band Patient awake    Reviewed: Allergy & Precautions, NPO status , Patient's Chart, lab work & pertinent test results  Airway Mallampati: II  TM Distance: >3 FB Neck ROM: Full    Dental  (+) Teeth Intact, Poor Dentition, Dental Advisory Given   Pulmonary Current Smoker and Patient abstained from smoking.,    breath sounds clear to auscultation       Cardiovascular hypertension, Pt. on medications + CAD and + Peripheral Vascular Disease   Rhythm:Regular Rate:Normal     Neuro/Psych PSYCHIATRIC DISORDERS Anxiety Schizophrenia Dementia  Neuromuscular disease    GI/Hepatic Neg liver ROS, GERD  ,  Endo/Other  diabetes, Type 2, Insulin Dependent  Renal/GU CRFRenal disease     Musculoskeletal  (+) Arthritis ,   Abdominal Normal abdominal exam  (+)   Peds  Hematology   Anesthesia Other Findings   Reproductive/Obstetrics                            Anesthesia Physical Anesthesia Plan  ASA: III  Anesthesia Plan: General   Post-op Pain Management:    Induction: Intravenous  PONV Risk Score and Plan: 2 and Ondansetron, Dexamethasone and Midazolam  Airway Management Planned: Oral ETT and LMA  Additional Equipment: None  Intra-op Plan:   Post-operative Plan: Extubation in OR  Informed Consent: I have reviewed the patients History and Physical, chart, labs and discussed the procedure including the risks, benefits and alternatives for the proposed anesthesia with the patient or authorized representative who has indicated his/her understanding and acceptance.     Dental advisory given  Plan Discussed with: CRNA  Anesthesia Plan Comments: (Endo Tool ordered in Preop. )       Anesthesia Quick Evaluation

## 2020-06-28 NOTE — Progress Notes (Signed)
Inpatient Rehab Admissions Coordinator:   CIR consult received. Note that Pt. Underwent L AKA earlier today and has not yet been seen by PT/OT. I await therapy notes to make a determination on candidacy for CIR.   Clemens Catholic, [place], [place] Admissions Coordinator  [phone] ([place]) [phone] (office)

## 2020-06-28 NOTE — Consult Note (Signed)
WOC consult was requested for left AKA.  Vascular team is following for assessment and plan of care and performed surgery today.  Please refer to their team for further questions regarding plan of care. Please re-consult if further assistance is needed.  Thank-you,  Julien Girt MSN, [place], [place], [place], [place]

## 2020-06-28 NOTE — Op Note (Signed)
    OPERATIVE REPORT  DATE OF SURGERY: 06/28/2020  PATIENT: Allen Yu, 75 y.o. male MRN: [id number]  DOB: [birth year]  PRE-OPERATIVE DIAGNOSIS: Gangrene left foot  POST-OPERATIVE DIAGNOSIS:  Same  PROCEDURE: Left above-knee amputation  SURGEON:  Curt Jews, M.D.  PHYSICIAN ASSISTANT: Arlee Muslim, PA-C  The assistant was needed for exposure and to expedite the case  ANESTHESIA: LMA  EBL: per anesthesia record  Total I/O In: 1200 [I.V.:1100; IV Piggyback:100] Out: 300 [Blood:300]  BLOOD ADMINISTERED: none  DRAINS: none  SPECIMEN: none  COUNTS CORRECT:  YES  PATIENT DISPOSITION:  PACU - hemodynamically stable  PROCEDURE DETAILS: The patient was taken operating placed supine position with area of the left leg prepped draped you sterile fashion.  A fishmouth incision was made just above the knee and carried down through the subcutaneous fat and fascia with electrocautery.  The patient had a Gore-Tex femoropopliteal bypass which was patent.  This was occluded proximally and ligated distally.  The graft was divided and oversewn with 5-0 Prolene suture.  The femoral artery was ligated and divided and the femoral vein was ligated and divided.  The sciatic nerve was mobilized proximally and was ligated and divided.  The muscle bodies were divided to the level of the femur.  The periosteum was elevated on the femur and the femur was divided with a Gigli saw to prevent tension on the distal tip of the amputation stump.  Specimen was passed off the field.  All muscle was viable.  The wound was irrigated with saline and hemostasis was obtained with electrocautery.  The anterior fascia was closed with the posterior fascia with 0 Vicryl figure-of-eight sutures.  The skin was closed with skin staples.  Xeroform gauze, 4 x 4's Curlex and Ace were applied.  The patient was transferred to the recovery room in stable condition   Rosetta Posner, M.D., [facility] 06/28/2020 9:46 AM  Note: Portions  of this report may have been transcribed using voice recognition software.  Every effort has been made to ensure accuracy; however, inadvertent computerized transcription errors may still be present.

## 2020-06-28 NOTE — Anesthesia Procedure Notes (Signed)
Procedure Name: LMA Insertion Performed by: Valda Favia, CRNA Pre-anesthesia Checklist: Patient identified, Emergency Drugs available, Suction available, Patient being monitored and Timeout performed Patient Re-evaluated:Patient Re-evaluated prior to induction Oxygen Delivery Method: Circle system utilized Preoxygenation: Pre-oxygenation with 100% oxygen Induction Type: IV induction LMA: LMA inserted LMA Size: 5.0 Placement Confirmation: positive ETCO2 and breath sounds checked- equal and bilateral Tube secured with: Tape Dental Injury: Teeth and Oropharynx as per pre-operative assessment

## 2020-06-28 NOTE — H&P (Signed)
History and Physical    Allen Yu HWE:[id number] DOB: [birth year] DOA: 06/28/2020  Referring MD/NP/PA: Donavan Burnet, MD PCP: [name], MD  Patient coming from: PACU  Chief Complaint: Worsening heel ulcer  I have personally briefly reviewed patient's old medical records in [place]   HPI: Allen Yu is a 75 y.o. male with medical history significant of hypertension, DM type II, PVD, dementia, schizophrenia, PTSD, anemia of chronic disease, CKD, hard of hearing, and history of TB previously treated presents for worsening left lower extremity heel ulcer with pain.  History is obtained mostly from review of records as patient is very hard of hearing.  Patient had been hospitalized in May for diabetic foot infection of the left leg with critical limb ischemia.  He underwent left femoral to above-knee popliteal bypass with Gore-tex on 05/27/2020.  However, patient developed progressive gangrenous changes of the heel and was found to have a blistering over the dorsum of his foot and toes on follow-up appointment with Dr. Donnetta Hutching yesterday.  Patient underwent amputation today and had received antibiotics of Ancef as surgical prophylaxis.  He currently complains of in significant pain and wanting to rest.  Labs were significant for WBC 12.4 hemoglobin 8.2, sodium 134, chloride 98, CO2 29, BUN 12, creatinine 1.37, and glucose 392.  Following the procedure patient was noted to have blood pressures elevated up to 183/86.  ED Course: As seen above.  Review of Systems  Unable to perform ROS: Medical condition  Musculoskeletal: Positive for joint pain and myalgias.    Past Medical History:  Diagnosis Date  . Anemia   . Arthritis    all over  . Carotid stenosis   . Chronic back pain   . Chronic kidney disease   . Constipation   . Dementia ([place])   . Diabetes mellitus   . GERD (gastroesophageal reflux disease)   . History of kidney stones   . Hypertension    no per patient  . Lung  nodule   . Peripheral neuropathy   . Peripheral vascular disease ([place])   . PTSD (post-traumatic stress disorder)   . PTSD (post-traumatic stress disorder)   . Schizophrenia ([place])   . Tuberculosis    x 1 - treated -3 times    Past Surgical History:  Procedure Laterality Date  . ABDOMINAL AORTOGRAM W/LOWER EXTREMITY N/A 05/20/2020   Procedure: ABDOMINAL AORTOGRAM W/LOWER EXTREMITY;  Surgeon: Elam Dutch, MD;  Location: [facility];  Service: Cardiovascular;  Laterality: N/A;  . APPENDECTOMY    . BACK SURGERY  2000, 2013   x2  . BLADDER SURGERY     02  . CHOLECYSTECTOMY    . COLONOSCOPY    . COLONOSCOPY N/A 12/07/2014   Procedure: COLONOSCOPY;  Surgeon: Daneil Dolin, MD;  [facility];  Service: Endoscopy;  Laterality: N/A;  10:30 Am  . EYE SURGERY Bilateral    removed metal from eye  . FEMORAL-TIBIAL BYPASS GRAFT Left 05/27/2020   Procedure: LEFT FEMORAL TO PERONEAL ARTERY BYPASS;  Surgeon: Rosetta Posner, MD;  Location: [place] [place];  Service: Vascular;  Laterality: Left;  . HERNIA REPAIR Right   . INGUINAL HERNIA REPAIR Left 11/03/2013   Procedure: HERNIA REPAIR INGUINAL ADULT;  Surgeon: Gayland Curry, MD;  Location: [place];  Service: General;  Laterality: Left;  . SHOULDER SURGERY     RIGHT SHOULDER   . VIDEO BRONCHOSCOPY WITH ENDOBRONCHIAL NAVIGATION N/A 07/10/2019   Procedure: VIDEO BRONCHOSCOPY  WITH ENDOBRONCHIAL NAVIGATION;  Surgeon: Melrose Nakayama, MD;  Location: [place];  Service: Thoracic;  Laterality: N/A;     reports that he has been smoking cigarettes. He has been smoking about 0.50 packs per day. He has never used smokeless tobacco. He reports current drug use. Drug: Marijuana. He reports that he does not drink alcohol.  Allergies  Allergen Reactions  . Bee Venom Anaphylaxis  . Codeine Other (See Comments)    incoherent   . Diovan [Valsartan] Other (See Comments)    incoherent  . Propoxyphene Other (See Comments)    Dizziness, "Makes me feel  drunk"    Family History  Problem Relation Age of Onset  . Heart disease Mother        before age 39    Prior to Admission medications   Medication Sig Start Date End Date Taking? Authorizing Provider  acetaminophen (TYLENOL) 500 MG tablet Take 500 mg by mouth every 6 (six) hours as needed for moderate pain.   Yes [provider]  aspirin EC 81 MG tablet Take 1 tablet (81 mg total) by mouth at bedtime. 05/21/20 08/19/20 Yes [place] Eric J, DO  atorvastatin (LIPITOR) 40 MG tablet Take 1 tablet (40 mg total) by mouth at bedtime. 05/21/20 08/19/20 Yes [place] Eric J, DO  donepezil (ARICEPT) 10 MG tablet Take 10 mg by mouth at bedtime.  06/28/16  Yes [provider]  gabapentin (NEURONTIN) 100 MG capsule Take 100 mg by mouth at bedtime. 11/06/19  Yes [provider]  insulin lispro (HUMALOG) 100 UNIT/ML injection Inject 5-10 Units into the skin daily as needed for high blood sugar.    Yes [provider]  LANTUS 100 UNIT/ML injection Inject 60 Units into the skin at bedtime as needed (High blood glucose). 06/09/16  Yes [provider]  losartan (COZAAR) 50 MG tablet Take 50 mg by mouth at bedtime.   Yes [provider]  Multiple Vitamins-Minerals (MULTIVITAMIN WITH MINERALS) tablet Take 1 tablet by mouth daily. 50 plus   Yes [provider]  oxyCODONE-acetaminophen (PERCOCET/ROXICET) 5-325 MG tablet Take 1 tablet by mouth every 4 (four) hours as needed for severe pain. 06/17/20  Yes Waynetta Sandy, MD  B-D UF III MINI PEN NEEDLES 31G X 5 MM MISC SMARTSIG:1 Each SUB-Q Daily 12/23/19   [provider]  oxyCODONE-acetaminophen (PERCOCET/ROXICET) 5-325 MG tablet Take 1 tablet by mouth every 6 (six) hours as needed for moderate pain. Patient not taking: No sig reported 05/30/20   Ulyses Amor, PA-C    Physical Exam:  Constitutional: Cachectic elderly male currently in no acute distress Vitals:   06/28/20 0641 06/28/20  0930  BP: (!) 158/56   Pulse: 90   Resp: 18   Temp: 98.4 F (36.9 C) 98 F (36.7 C)  TempSrc: Oral   SpO2: 98%   Weight: 57.6 kg   Height: 6\' 1"  (1.854 m)    Eyes: PERRL, lids and conjunctivae normal ENMT: Mucous membranes are moist. Posterior pharynx clear of any exudate or lesions. Very hard of hearing Neck: normal, supple, no masses, no thyromegaly Respiratory: clear to auscultation bilaterally, no wheezing, no crackles. Normal respiratory effort. No accessory muscle use.  Cardiovascular: Regular rate and rhythm, no murmurs / rubs / gallops. No extremity edema. 2+ pedal pulses. No carotid bruits.  Abdomen: no tenderness, no masses palpated. No hepatosplenomegaly. Bowel sounds positive.  Musculoskeletal: no clubbing / cyanosis.  Left above-knee amputation currently wrapped. Skin: no rashes, lesions, ulcers. No  induration Neurologic: CN 2-12 grossly intact.  Able to move all Psychiatric: Patient is alert and oriented to person and place, but starts intermittently laughing to himself for no reason   Labs on Admission: I have personally reviewed following labs and imaging studies  CBC: Recent Labs  Lab 06/28/20 0554  WBC 12.4*  HGB 8.2*  HCT 24.8*  MCV 90.2  PLT 283*   Basic Metabolic Panel: Recent Labs  Lab 06/28/20 0554  NA 134*  K 3.8  CL 98  CO2 29  GLUCOSE 392*  BUN 12  CREATININE 1.37*  CALCIUM 9.0   GFR: Estimated Creatinine Clearance: 38.5 mL/min (A) (by C-G formula based on SCr of 1.37 mg/dL (H)). Liver Function Tests: Recent Labs  Lab 06/28/20 0554  AST 23  ALT 17  ALKPHOS 152*  BILITOT 0.7  PROT 6.4*  ALBUMIN 2.7*   No results for input(s): LIPASE, AMYLASE in the last 168 hours. No results for input(s): AMMONIA in the last 168 hours. Coagulation Profile: Recent Labs  Lab 06/28/20 0554  INR 1.1   Cardiac Enzymes: No results for input(s): CKTOTAL, CKMB, CKMBINDEX, TROPONINI in the last 168 hours. BNP (last 3 results) No results for  input(s): PROBNP in the last 8760 hours. HbA1C: No results for input(s): HGBA1C in the last 72 hours. CBG: Recent Labs  Lab 06/28/20 0628 06/28/20 0630 06/28/20 0722 06/28/20 0833 06/28/20 0918  GLUCAP 410* 386* 374* 277* 230*   Lipid Profile: No results for input(s): CHOL, HDL, LDLCALC, TRIG, CHOLHDL, LDLDIRECT in the last 72 hours. Thyroid Function Tests: No results for input(s): TSH, T4TOTAL, FREET4, T3FREE, THYROIDAB in the last 72 hours. Anemia Panel: No results for input(s): VITAMINB12, FOLATE, FERRITIN, TIBC, IRON, RETICCTPCT in the last 72 hours. Urine analysis:    Component Value Date/Time   COLORURINE YELLOW 05/25/2020 0839   APPEARANCEUR CLEAR 05/25/2020 0839   LABSPEC 1.012 05/25/2020 0839   PHURINE 5.0 05/25/2020 0839   GLUCOSEU >=500 (A) 05/25/2020 0839   HGBUR SMALL (A) 05/25/2020 0839   BILIRUBINUR NEGATIVE 05/25/2020 0839   KETONESUR NEGATIVE 05/25/2020 0839   PROTEINUR 100 (A) 05/25/2020 0839   UROBILINOGEN 0.2 02/18/2014 0950   NITRITE NEGATIVE 05/25/2020 0839   LEUKOCYTESUR NEGATIVE 05/25/2020 0839   Sepsis Labs: Recent Results (from the past 240 hour(s))  SARS CORONAVIRUS 2 (TAT 6-24 HRS) Nasopharyngeal Nasopharyngeal Swab     Status: None   Collection Time: 06/27/20  2:01 PM   Specimen: Nasopharyngeal Swab  Result Value Ref Range Status   SARS Coronavirus 2 NEGATIVE NEGATIVE Final    Comment: (NOTE) SARS-CoV-2 target nucleic acids are NOT DETECTED.  The SARS-CoV-2 RNA is generally detectable in upper and lower respiratory specimens during the acute phase of infection. Negative results do not preclude SARS-CoV-2 infection, do not rule out co-infections with other pathogens, and should not be used as the sole basis for treatment or other patient management decisions. Negative results must be combined with clinical observations, patient history, and epidemiological information. The expected result is Negative.  Fact Sheet for  Patients: SugarRoll.be  Fact Sheet for Healthcare Providers: https://www.woods-mathews.com/  This test is not yet approved or cleared by the [place] FDA and  has been authorized for detection and/or diagnosis of SARS-CoV-2 by FDA under an Emergency Use Authorization (EUA). This EUA will remain  in effect (meaning this test can be used) for the duration of the COVID-19 declaration under Se ction 564(b)(1) of the Act, 21 U.S.C. section 360bbb-3(b)(1), unless the authorization is terminated  or revoked sooner.  Performed at [facility], [place] [address] [place], [place] [address]      Radiological Exams on Admission: No results found.  EKG: Independently reviewed.  Sinus rhythm at 88 bpm with premature atrial complexes.  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 Donnetta Hutching.  However, patient continued to have gangrenous changes of his heel with persistent rest pain.  He underwent left above-knee amputation today by Dr. Donnetta Hutching. -Admit to a surgical telemetry bed -Oxycodone as needed for pain -Wound care consulted -PT/OT to eval and treat -Transitions of care consulted for likely need of rehab -Appreciate vascular surgery consultative services, we will follow-up for any further recommendations  Leukocytosis: WBC elevated at 12.4.  Suspect secondary to acute infection.  Patient had received antibiotics Ancef prior to amputation. - recheck CBC tomorrow morning  Hypertensive urgency: Acute.  Systolic blood pressures currently elevated up into the 190s.  Unclear if this is secondary to pain -Continue losartan -Hydralazine IV as needed for elevated blood pressures  Acute kidney injury superimposed on chronic kidney disease stage II: Patient presented with creatinine elevated  up to 1.37 with BUN 12.  Baseline creatinine previously have been around 1. -Hold nephrotoxic agents -Recheck kidney function in a.m.  Anemia of chronic disease: Acute on chronic.  Hemoglobin 8.2 g/dL on admission.  Hemoglobin had been 9.4 on 05/29/2020 when last checked. -Continue to monitor H&H -Consider transfusion if hemoglobin drops below 7 g/dL  Diabetes mellitus type 2, uncontrolled: On admission patient's initial glucose was 392 without elevated anion gap.  Last hemoglobin A1c 9.2 on 05/19/2020.  Home regimen includes Lantus 60 units nightly along with 5- 10 units as needed for elevated blood sugars.  He had been given 10 units of NovoLog and started on insulin drip. -Hypoglycemic protocols -Reduce Lantus to 45 units nightly while inpatient setting -CBGs before every meal with moderate SSI -Adjust insulin regimen as needed   Peripheral vascular disease: Patient just underwent -Continue statin -Consider resuming aspirin if hemoglobin is remains stable tomorrow  Hyperlipidemia -Continue atorvastatin 40 mg nightly  Dementia -Continue Aricept  Thrombocytosis: Acute.  Platelet count 411 on admission.  Suspect reactive in nature. -Continue to monitor  Severe protein calorie malnutrition: BMI 16.2 kg/m.  Patient significantly underweight and cachectic. -Continue Ensure Enlive with meals  History of tuberculosis: Patient previously had been followed by  Dr. Megan Salon of infectious disease in the outpatient. Case had reportedly been discussed with ID, Dr. Gale Journey on 4/27; no further treatment indicated or need of airborne precautions.  -Outpatient follow-up with ID as needed  Tobacco use disorder -Continue to counsel on the need of cessation of tobacco use.  -Nicotine patch offered    DVT prophylaxis: SCD Code Status: Full Family Communication: None Disposition Plan: To be determined Consults called: Vascular surgery Admission status: Inpatient, require more than 2 midnight stay  due to  procedure  Norval Morton MD [facility]   If 7PM-7AM, please contact night-coverage   06/28/2020, 9:37 AM

## 2020-06-28 NOTE — Transfer of Care (Signed)
Immediate Anesthesia Transfer of Care Note  Patient: Allen Yu  Procedure(s) Performed: AMPUTATION ABOVE KNEE LEFT (Left Knee)  Patient Location: PACU  Anesthesia Type:General  Level of Consciousness: drowsy  Airway & Oxygen Therapy: Patient Spontanous Breathing and Patient connected to face mask oxygen  Post-op Assessment: Report given to RN and Post -op Vital signs reviewed and stable  Post vital signs: Reviewed and stable  Last Vitals:  Vitals Value Taken Time  BP 144/70 06/28/20 0915  Temp    Pulse 122 06/28/20 0922  Resp 21 06/28/20 0922  SpO2 99 % 06/28/20 0922  Vitals shown include unvalidated device data.  Last Pain:  Vitals:   06/28/20 0641  TempSrc: Oral  PainSc:       Patients Stated Pain Goal: 3 (11/57/26 2035)  Complications: No complications documented.

## 2020-06-28 NOTE — Interval H&P Note (Signed)
History and Physical Interval Note:  06/28/2020 7:13 AM  Allen Yu  has presented today for surgery, with the diagnosis of PAD.  The various methods of treatment have been discussed with the patient and family. After consideration of risks, benefits and other options for treatment, the patient has consented to  Procedure(s): AMPUTATION ABOVE KNEE LEFT (Left) as a surgical intervention.  The patient's history has been reviewed, patient examined, no change in status, stable for surgery.  I have reviewed the patient's chart and labs.  Questions were answered to the patient's satisfaction.     Curt Jews

## 2020-06-28 NOTE — Discharge Instructions (Signed)
Incision Care, Adult An incision is a surgical cut that is made through your skin. Most incisions are closed after a surgical procedure. Your incision may be closed with stitches (sutures), staples, skin glue, or adhesive strips. You may need to return to your health care provider to have sutures or staples removed. This may occur several days or several weeks after your surgery. Until then, the incision needs to be cared for properly to prevent infection. Follow instructions from your health care provider about how to care for your incision. Supplies needed:  Soap, water, and a clean hand towel.  Wound cleanser.  A clean bandage (dressing), if needed.  Cream or ointment, if told by your health care provider.  Clean gauze. How to care for your incision Cleaning the incision Ask your health care provider how to clean the incision. This may include:  Using mild soap and water, or wound cleanser.  Using a clean gauze to pat the incision dry after cleaning it. Dressing changes  Wash your hands with soap and water for at least 20 seconds before and after you change the dressing. If soap and water are not available, use hand sanitizer.  Change your dressing as told by your health care provider.  Leave sutures, staples, skin glue, or adhesive strips in place. These skin closures may need to stay in place for 2 weeks or longer. If adhesive strip edges start to loosen and curl up, you may trim the loose edges. Do not remove adhesive strips completely unless your health care provider tells you to do that.  Apply cream or ointment. Do this only as told by your health care provider.  Cover the incision with a clean dressing. Ask your health care provider when you can begin leaving the incision uncovered. Checking for infection Check your incision area every day for signs of infection. Check for:  More redness, swelling, or pain.  More fluid or blood.  Warmth.  Pus or a bad smell.    Follow these instructions at home Medicines  Take over-the-counter and prescription medicines only as told by your health care provider.  If you were prescribed an antibiotic medicine, cream, or ointment, take or apply it as told by your health care provider. Do not stop using the antibiotic even if your condition improves. Eating and drinking  Eat a diet that includes protein, vitamin A, vitamin C, and other nutrient-rich foods to help the wound heal. ? Foods rich in protein include meat, fish, eggs, dairy, beans, and nuts. ? Foods rich in vitamin A include carrots and dark green, leafy vegetables. ? Foods rich in vitamin C include citrus fruits, tomatoes, broccoli, and peppers.  Drink enough fluid to keep your urine pale yellow. General instructions  Do not take baths, swim, use a hot tub, or do anything that would put the incision underwater until your health care provider approves. Ask your health care provider if you may take showers. You may only be allowed to take sponge baths.  Limit movement around your incision to promote healing. ? Avoid straining, lifting, or exercising for the first 2 weeks after your procedure, or for as long as told by your health care provider. ? Return to your normal activities as told by your health care provider. Ask your health care provider what activities are safe for you.  Do not scratch or pick at the incision. Keep it covered as told by your health care provider.  Protect your incision from the sun when you are   outside for the first 6 months, or for as long as told by your health care provider. Cover up the scar area or apply sunscreen that has an SPF of at least 30.  Do not use any products that contain nicotine or tobacco, such as cigarettes, e-cigarettes, and chewing tobacco. These can delay incision healing after surgery. If you need help quitting, ask your health care provider.  Keep all follow-up visits as told by your health care  provider. This is important.   Contact a health care provider if:  You have any of these signs of infection: ? More redness, swelling, or pain around your incision. ? More fluid or blood coming from your incision. ? Warmth coming from your incision. ? Pus or a bad smell coming from your incision. ? A fever.  You are nauseous or you vomit.  You are dizzy.  Your sutures, staples, skin glue, or adhesive strips come undone. Get help right away if:  You have a red streak on the skin near your incision.  Your incision bleeds through the dressing and the bleeding does not stop with gentle pressure.  The edges of your incision open up and separate.  You have signs of a serious bodily reaction to an infection. These signs may include: ? Fever, shaking chills, or feeling very cold. ? Confusion or anxiety. ? Severe pain. ? Trouble breathing. ? Fast heartbeat. ? Clammy or sweaty skin. ? A rash. These symptoms may represent a serious problem that is an emergency. Do not wait to see if the symptoms will go away. Get medical help right away. Call your local emergency services (911 in the [place] Do not drive yourself to the hospital. Summary  Follow instructions from your health care provider about how to care for your incision.  Wash your hands with soap and water for at least 20 seconds before and after you change the dressing. If soap and water are not available, use hand sanitizer.  Check your incision area every day for signs of infection.  Keep all follow-up visits as told by your health care provider. This is important. This information is not intended to replace advice given to you by your health care provider. Make sure you discuss any questions you have with your health care provider. Document Revised: 10/29/2018 Document Reviewed: 10/29/2018 Elsevier Patient Education  2021 [employer]vier Inc.   

## 2020-06-28 NOTE — Progress Notes (Signed)
Patient arrived to 4E08 from PACU. Compression wrap c/d/i on L AKA. VSS. Patient placed on telemetry and CCMD notified. Bed low and locked with bed alarm engaged. Patient oriented to room and call bell left within reach.   Gailen Shelter RN

## 2020-06-29 ENCOUNTER — Inpatient Hospital Stay (HOSPITAL_COMMUNITY): Payer: Medicare Other

## 2020-06-29 ENCOUNTER — Encounter (HOSPITAL_COMMUNITY): Payer: Self-pay | Admitting: Vascular Surgery

## 2020-06-29 DIAGNOSIS — I4891 Unspecified atrial fibrillation: Secondary | ICD-10-CM

## 2020-06-29 LAB — CBC
HCT: 24 % — ABNORMAL LOW (ref 39.0–52.0)
Hemoglobin: 8.3 g/dL — ABNORMAL LOW (ref 13.0–17.0)
MCH: 30.1 pg (ref 26.0–34.0)
MCHC: 34.6 g/dL (ref 30.0–36.0)
MCV: 87 fL (ref 80.0–100.0)
Platelets: 332 10*3/uL (ref 150–400)
RBC: 2.76 MIL/uL — ABNORMAL LOW (ref 4.22–5.81)
RDW: 13.8 % (ref 11.5–15.5)
WBC: 17.3 10*3/uL — ABNORMAL HIGH (ref 4.0–10.5)
nRBC: 0 % (ref 0.0–0.2)

## 2020-06-29 LAB — GLUCOSE, CAPILLARY
Glucose-Capillary: 49 mg/dL — ABNORMAL LOW (ref 70–99)
Glucose-Capillary: 67 mg/dL — ABNORMAL LOW (ref 70–99)
Glucose-Capillary: 113 mg/dL — ABNORMAL HIGH (ref 70–99)
Glucose-Capillary: 67 mg/dL — ABNORMAL LOW (ref 70–99)
Glucose-Capillary: 86 mg/dL (ref 70–99)
Glucose-Capillary: 102 mg/dL — ABNORMAL HIGH (ref 70–99)

## 2020-06-29 LAB — URINALYSIS, ROUTINE W REFLEX MICROSCOPIC
Bilirubin Urine: NEGATIVE
Glucose, UA: >=500 mg/dL — AB
Ketones, ur: 5 mg/dL — AB
Nitrite: POSITIVE — AB
Protein, ur: 100 mg/dL — AB
Specific Gravity, Urine: 1.014 (ref 1.005–1.030)
pH: 5 (ref 5.0–8.0)

## 2020-06-29 LAB — COMPREHENSIVE METABOLIC PANEL
ALT: 17 U/L (ref 0–44)
AST: 33 U/L (ref 15–41)
Albumin: 2.6 g/dL — ABNORMAL LOW (ref 3.5–5.0)
Alkaline Phosphatase: 115 U/L (ref 38–126)
Anion gap: 9 (ref 5–15)
BUN: 12 mg/dL (ref 8–23)
CO2: 29 mmol/L (ref 22–32)
Calcium: 8.9 mg/dL (ref 8.9–10.3)
Chloride: 100 mmol/L (ref 98–111)
Creatinine, Ser: 0.97 mg/dL (ref 0.61–1.24)
GFR, Estimated: >60 mL/min (ref >60)
Glucose, Bld: 101 mg/dL — ABNORMAL HIGH (ref 70–99)
Potassium: 3.5 mmol/L (ref 3.5–5.1)
Sodium: 138 mmol/L (ref 135–145)
Total Bilirubin: 1.3 mg/dL — ABNORMAL HIGH (ref 0.3–1.2)
Total Protein: 6 g/dL — ABNORMAL LOW (ref 6.5–8.1)

## 2020-06-29 LAB — ECHOCARDIOGRAM COMPLETE
AR max vel: 2.05 cm2
AV Area VTI: 2.01 cm2
AV Area mean vel: 2.12 cm2
AV Mean grad: 4 mmHg
AV Peak grad: 7.8 mmHg
Ao pk vel: 1.4 m/s
Height: 73 in
S' Lateral: 3.8 cm
Weight: 2035.29 oz

## 2020-06-29 LAB — TYPE AND SCREEN
ABO/RH(D): O POS
Antibody Screen: NEGATIVE
Unit division: 0

## 2020-06-29 LAB — BPAM RBC
Blood Product Expiration Date: 202206152359
ISSUE DATE / TIME: 202206072237
Unit Type and Rh: 5100

## 2020-06-29 LAB — D-DIMER, QUANTITATIVE: D-Dimer, Quant: 2.88 ug/mL-FEU — ABNORMAL HIGH (ref 0.00–0.50)

## 2020-06-29 LAB — MAGNESIUM: Magnesium: 1.9 mg/dL (ref 1.7–2.4)

## 2020-06-29 LAB — TSH: TSH: 1.089 u[IU]/mL (ref 0.350–4.500)

## 2020-06-29 LAB — TROPONIN I (HIGH SENSITIVITY)
Troponin I (High Sensitivity): 23 ng/L — ABNORMAL HIGH (ref <18)
Troponin I (High Sensitivity): 22 ng/L — ABNORMAL HIGH (ref <18)

## 2020-06-29 MED ORDER — SODIUM CHLORIDE 0.9 % IV SOLN
1.0000 g | INTRAVENOUS | Status: DC
  Administered 2020-06-29 – 2020-06-30 (×2): 1 g via INTRAVENOUS
  Filled 2020-06-29 (×2): qty 1

## 2020-06-29 MED ORDER — IOHEXOL 350 MG/ML SOLN
75.0000 mL | Freq: Once | INTRAVENOUS | Status: AC | PRN
  Administered 2020-06-29: 75 mL via INTRAVENOUS

## 2020-06-29 NOTE — Progress Notes (Signed)
Talked to pt's Wife Allen Yu. All her questions answered. Wife said he could be very difficult, and said sorry for is behavior if he is being difficult.  pt's wife stated " its just me at home to help,  other family member are not going to help. He have said he has family members to help him at home,  he is lying. I know he wants to come home, I can try but if he needs to go somewhere or stay there for therapy  will go with that."  Will pass it on to next shift.

## 2020-06-29 NOTE — Progress Notes (Signed)
PROGRESS NOTE    Allen Yu  XFG:[id number] DOB: [birth year] DOA: 06/28/2020 PCP: [name], MD   Brief Narrative:  Allen Yu is a 75 y.o. male with medical history significant of hypertension, DM type II, PVD, dementia, schizophrenia, PTSD, anemia of chronic disease, CKD, hard of hearing, and history of TB previously treated presents for worsening left lower extremity heel ulcer with pain since January of this year, status post left femoropopliteal bypass on 05/27/2020 with progressive gangrenous changes to the foot and ultimately underwent AKA 06/28/2020.  TRH called for admission  Assessment & Plan:   Principal Problem:   S/P AKA (above knee amputation) unilateral, left (HCC) Active Problems:   Acute kidney injury superimposed on chronic kidney disease (HCC)   Normocytic anemia   Tobacco use disorder   History of latent tuberculosis   Protein-calorie malnutrition, severe   Gangrene of left foot (HCC)  Sepsis secondary to gangrenous left foot status post left AKA 06/28/2020, POA - Infection source control status post amputation, leukocytosis, tachycardia with likely left foot as source for infection - Continue ceftriaxone postoperatively per surgery - Oxycodone as needed for pain - Wound care consulted - PT/OT to eval and treat -ultimate disposition likely SNF at this point  New onset A. fib, likely provoked in the setting of above -Rate currently controlled, no symptoms overnight -We will continue to follow clinically, if remains in A. fib will discuss anticoagulation in the near future given unclear burden  -Questionably provoked in the setting of sepsis and surgery versus paroxysmal history previously undiagnosed - chadsvasc 4 = 4.8% annual risk of stroke -If patient remains in A. fib over the next 48 hours will consider echocardiogram and cardiac sideline to discuss possible cardioversion  Elevated D-dimer, PE ruled out  -CT negative for PE, most likely etiology for  elevated D-dimer would have been left lower extremity with poor vasculature, now amputated, no longer risk for disseminated blood clot  Hypertensive urgency in the setting of above  Concurrent uncontrolled pain  -Blood pressure much more well controlled today in the setting of pain control and infection control  -Continue home losartan, adjust home medications as necessary  AKI on CKD 2, improving -Likely in the setting of poor p.o. intake secondary to pain and perioperative status, continue to increase p.o. intake as tolerated  Acute blood loss anemia on chronic anemia of chronic disease,  -Status post transfusion on the seventh  -continue to monitor H&H -Consider transfusion if hemoglobin drops below 7 g/dL  Diabetes mellitus type 2, uncontrolled with hyperglycemia:  -A1c 9.2 in April  -Continue 45 units Lantus at bedtime given poor p.o. intake, home regimen 60u  -Continue sliding scale, hypoglycemic protocol -Adjust insulin back to home dose pending p.o. intake  Peripheral vascular disease: - Continue statin -Resume aspirin in the next 24 hours  Hyperlipidemia -Continue atorvastatin 40 mg nightly  Dementia -Continue Aricept  Thrombocytosis:  - Likely acute phase reactant -Within normal limits today  Severe protein calorie malnutrition: BMI 16.2 kg/m.   -Patient significantly underweight and cachectic. -Continue Ensure Enlive with meals  History of tuberculosis: Patient previously had been followed by  Dr. Megan Salon of infectious disease in the outpatient. Case had reportedly been discussed with ID, Dr. Gale Journey on 4/27; no further treatment indicated or need of airborne precautions.  -Outpatient follow-up with IDas needed -CTA 06/29/2020 shows right upper lobe nodule -unclear if this is related to history of TB or something more insidious, repeat imaging per outpatient guidelines  Tobacco use disorder -Continue to counsel on the need of cessation of tobacco use.   -Nicotine patch offered  DVT prophylaxis: Defer to surgery, currently off chemical prophylaxis Code Status: Full Family Communication: None present  Status is: Inpatient  Dispo: The patient is from: Home              Anticipated d/c is to: To be determined, likely SNF              Anticipated d/c date is: 48 to 72 hours pending clinical course              Patient currently not medically stable for discharge  Consultants:   Vascular surgery  Procedures:   Left AKA as above 07/02/2020  Antimicrobials:  Ceftriaxone  Subjective: No acute issues or events overnight, patient's only complaint today is of "bad food" but denies any ongoing pain nausea vomiting diarrhea constipation headache fevers chills chest pain or shortness of breath.  Objective: Vitals:   06/29/20 0201 06/29/20 0341 06/29/20 0718 06/29/20 1131  BP: (!) 154/76 (!) 158/86 (!) 164/78 (!) 159/83  Pulse: (!) 106 (!) 136 92 98  Resp: 16 20 (!) 24 12  Temp: 98.6 F (37 C) 97.7 F (36.5 C) 98.6 F (37 C) 97.6 F (36.4 C)  TempSrc: Oral Oral Oral Oral  SpO2: 97% 96% 98%   Weight:      Height:        Intake/Output Summary (Last 24 hours) at 06/29/2020 1204 Last data filed at 06/29/2020 1138 Gross per 24 hour  Intake 1004.73 ml  Output 1500 ml  Net -495.27 ml   Filed Weights   06/28/20 0641 06/28/20 1046  Weight: 57.6 kg 57.7 kg    Examination:  General:  Pleasantly resting in bed, No acute distress. HEENT:  Normocephalic atraumatic.  Sclerae nonicteric, noninjected.  Extraocular movements intact bilaterally. Neck:  Without mass or deformity.  Trachea is midline. Lungs:  Clear to auscultate bilaterally without rhonchi, wheeze, or rales. Heart:  Regular rate and rhythm.  Without murmurs, rubs, or gallops. Abdomen:  Soft, nontender, nondistended.  Without guarding or rebound. Extremities: Without cyanosis, clubbing, edema, left leg bandage clean dry intact Vascular:  Dorsalis pedis and posterior tibial  pulses palpable bilaterally. Skin:  Warm and dry, no erythema, no ulcerations.  Left leg bandage clean dry intact  Data Reviewed: I have personally reviewed following labs and imaging studies  CBC: Recent Labs  Lab 06/28/20 0554 06/28/20 1707 06/29/20 0509  WBC 12.4*  --  17.3*  HGB 8.2* 6.9* 8.3*  HCT 24.8* 20.2* 24.0*  MCV 90.2  --  87.0  PLT 411*  --  353   Basic Metabolic Panel: Recent Labs  Lab 06/28/20 0554 06/29/20 0509  NA 134* 138  K 3.8 3.5  CL 98 100  CO2 29 29  GLUCOSE 392* 101*  BUN 12 12  CREATININE 1.37* 0.97  CALCIUM 9.0 8.9  MG  --  1.9   GFR: Estimated Creatinine Clearance: 54.5 mL/min (by C-G formula based on SCr of 0.97 mg/dL). Liver Function Tests: Recent Labs  Lab 06/28/20 0554 06/29/20 0509  AST 23 33  ALT 17 17  ALKPHOS 152* 115  BILITOT 0.7 1.3*  PROT 6.4* 6.0*  ALBUMIN 2.7* 2.6*   No results for input(s): LIPASE, AMYLASE in the last 168 hours. No results for input(s): AMMONIA in the last 168 hours. Coagulation Profile: Recent Labs  Lab 06/28/20 0554  INR 1.1   Cardiac Enzymes: No  results for input(s): CKTOTAL, CKMB, CKMBINDEX, TROPONINI in the last 168 hours. BNP (last 3 results) No results for input(s): PROBNP in the last 8760 hours. HbA1C: No results for input(s): HGBA1C in the last 72 hours. CBG: Recent Labs  Lab 06/28/20 2135 06/29/20 0341 06/29/20 0416 06/29/20 0449 06/29/20 1129  GLUCAP 209* 49* 67* 113* 67*   Lipid Profile: No results for input(s): CHOL, HDL, LDLCALC, TRIG, CHOLHDL, LDLDIRECT in the last 72 hours. Thyroid Function Tests: Recent Labs    06/29/20 0509  TSH 1.089   Anemia Panel: No results for input(s): VITAMINB12, FOLATE, FERRITIN, TIBC, IRON, RETICCTPCT in the last 72 hours. Sepsis Labs: No results for input(s): PROCALCITON, LATICACIDVEN in the last 168 hours.  Recent Results (from the past 240 hour(s))  SARS CORONAVIRUS 2 (TAT 6-24 HRS) Nasopharyngeal Nasopharyngeal Swab     Status:  None   Collection Time: 06/27/20  2:01 PM   Specimen: Nasopharyngeal Swab  Result Value Ref Range Status   SARS Coronavirus 2 NEGATIVE NEGATIVE Final    Comment: (NOTE) SARS-CoV-2 target nucleic acids are NOT DETECTED.  The SARS-CoV-2 RNA is generally detectable in upper and lower respiratory specimens during the acute phase of infection. Negative results do not preclude SARS-CoV-2 infection, do not rule out co-infections with other pathogens, and should not be used as the sole basis for treatment or other patient management decisions. Negative results must be combined with clinical observations, patient history, and epidemiological information. The expected result is Negative.  Fact Sheet for Patients: SugarRoll.be  Fact Sheet for Healthcare Providers: https://www.woods-mathews.com/  This test is not yet approved or cleared by the [place] FDA and  has been authorized for detection and/or diagnosis of SARS-CoV-2 by FDA under an Emergency Use Authorization (EUA). This EUA will remain  in effect (meaning this test can be used) for the duration of the COVID-19 declaration under Se ction 564(b)(1) of the Act, 21 U.S.C. section 360bbb-3(b)(1), unless the authorization is terminated or revoked sooner.  Performed at [facility], [place] [address] [place], [place] [address]          Radiology Studies: CT Angio Chest Pulmonary Embolism (PE) W or WO Contrast  Result Date: 06/29/2020 CLINICAL DATA:  Rule out pulmonary embolus.  Elevated D-dimer EXAM: CT ANGIOGRAPHY CHEST WITH CONTRAST TECHNIQUE: Multidetector CT imaging of the chest was performed using the standard protocol during bolus administration of intravenous contrast. Multiplanar CT image reconstructions and MIPs were obtained to evaluate the vascular anatomy. CONTRAST:  10mL OMNIPAQUE IOHEXOL 350 MG/ML SOLN COMPARISON:  CT chest 06/30/2019 FINDINGS: Cardiovascular:  Satisfactory opacification of the pulmonary arteries to the segmental level. No evidence of pulmonary embolism. Normal heart size. Aortic atherosclerosis. Coronary artery calcifications. No pericardial effusion. Mediastinum/Nodes: No enlarged mediastinal, hilar, or axillary lymph nodes. Thyroid gland, trachea, and esophagus demonstrate no significant findings. Lungs/Pleura: Moderate to advanced changes of centrilobular and paraseptal emphysema. Within the right upper lobe there is a solid-appearing nodule which measures 1.6 x 1.4 by 1.4 cm, image 32/3 Focal area of subpleural consolidation is noted within the posteromedial left base, image 74/7. Small left pleural effusion is present. Upper Abdomen: No acute abnormality. Musculoskeletal: No chest wall abnormality. No acute or significant osseous findings. Review of the MIP images confirms the above findings. IMPRESSION: 1. No evidence for acute pulmonary embolus. 2. Right upper lobe pulmonary nodule is identified measuring up to 1.6 cm. Suspicious for primary bronchogenic carcinoma. Further evaluation with PET-CT as warranted. 3. Subpleural consolidation in the posterior  left base with small left pleural effusion. Favor inflammatory or infectious etiology. A 3 month, short-term interval follow-up CT of the chest is recommended to ensure resolution. This could also be readdressed at PET-CT. 4. Aortic Atherosclerosis (ICD10-I70.0) and Emphysema (ICD10-J43.9). 5. Coronary artery calcifications. Electronically Signed   By: Kerby Moors M.D.   On: 06/29/2020 09:59        Scheduled Meds: . atorvastatin  40 mg Oral QHS  . donepezil  10 mg Oral QHS  . feeding supplement  237 mL Oral BID BM  . gabapentin  100 mg Oral QHS  . insulin aspart  0-15 Units Subcutaneous TID WC  . insulin aspart  0-5 Units Subcutaneous QHS  . insulin glargine  45 Units Subcutaneous QHS  . losartan  50 mg Oral QHS  . multivitamin with minerals  1 tablet Oral Daily  . sodium  chloride flush  3 mL Intravenous Q12H   Continuous Infusions: . cefTRIAXone (ROCEPHIN)  IV 1 g (06/29/20 1030)     LOS: 1 day   Time spent: 58min  Allen Yu C Mckinlee Dunk, DO [facility]  If 7PM-7AM, please contact night-coverage [email or web address]  06/29/2020, 12:04 PM

## 2020-06-29 NOTE — Progress Notes (Signed)
OT Cancellation Note  Patient Details Name: Allen Yu MRN: [id number] DOB: [birth year]   Cancelled Treatment:    Reason Eval/Treat Not Completed: Other (comment) Noted with elevated d-dimer and new orders for chest CT to rule out PE. Will hold OT eval until chest CT completed.  Layla Maw 06/29/2020, 8:20 AM

## 2020-06-29 NOTE — Progress Notes (Signed)
Inpatient Rehab Admissions Coordinator:   Pt. Is not stable enough for CIR at this time, note CT to rule out PE and elevated D-dimer. Will await medical readiness and evaluations with recommendations from PT/OT before making determination on candidacy for CIR.  Clemens Catholic, [place], [place] Admissions Coordinator  [phone] ([place]) [phone] (office)

## 2020-06-29 NOTE — Progress Notes (Addendum)
Dr. Vicente Serene test page to review all lab results and U.A. result.

## 2020-06-29 NOTE — Progress Notes (Signed)
   06/29/20 0341  Vitals  Temp 97.7 F (36.5 C)  Temp Source Oral  BP (!) 158/86  MAP (mmHg) 100  BP Location Left Arm  BP Method Automatic  Patient Position (if appropriate) Lying  Pulse Rate (!) 136  Pulse Rate Source Monitor  ECG Heart Rate (!) 136  Resp 20  Level of Consciousness  Level of Consciousness Alert  MEWS COLOR  MEWS Score Color Yellow  Oxygen Therapy  SpO2 96 %  O2 Device Room Air  MEWS Score  MEWS Temp 0  MEWS Systolic 0  MEWS Pulse 3  MEWS RR 0  MEWS LOC 0  MEWS Score 3  Provider Notification  Provider Name/Title [email or web address]  Date Provider Notified 06/29/20  Time Provider Notified [phone]  Notification Type Page  Notification Reason Change in status  On Tele. Patient went into At Fib rate 90-130- V.S. see above. Patient was asleep and was asymptomatic. Patient was very diaphoric CBG done at 03:41  CBG was 49 . Gave O.J and Ensure [name] Rn aware repeat CBG at 4:16- 67 gave patient pudding repeat CBG at 04:49 113. Patient had no idea his sugar was low and his H.R. irreg and fast. Clean patient and bed.Dr. Alto Denver ext paged @04 :20 call return @ 04:29  See new orders. Patient did not eat H.S. snack

## 2020-06-29 NOTE — Progress Notes (Signed)
Went to room to change tele battery, and vitals. Pt was very disrespectful and impulsive. He said he wants to sleep and doesn't want anybody to wake him up. Pt used foul language and was very uncooperative. Raised his feast at NT . Will continue to monitor.

## 2020-06-29 NOTE — Progress Notes (Signed)
  Echocardiogram 2D Echocardiogram has been performed.  Merrie Roof F 06/29/2020, 3:44 PM

## 2020-06-29 NOTE — Evaluation (Signed)
Occupational Therapy Evaluation Patient Details Name: Allen Yu MRN: [id number] DOB: Aug 27, 1945 Today's Date: 06/29/2020    History of Present Illness 75 y.o. male who presented with progressive gangrenous changes of L LE. Pt with recent hx of left femoral to above-knee popliteal bypass with Gore-tex on 05/27/2020. 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 PMH: HTN, DM II, PVD, dementia, schizophrenia, PTSF, anemia, CKD, and TB.   Clinical Impression   PTA, pt lives with spouse and teenage grandchildren. Pt reports use of wheelchair for mobility primarily due to hx of L LE pain. Pt also reports Modified Independence with ADLs but per wife, she has been assisting with dressing, bathing and toilet transfers. Pt presents now with deficits in cognition, strength, endurance, and balance. Pt very HOH, so wife assisted in providing background info after session. Pt overall Modified Independent for bed mobility, min guard for squat pivot transfer to recliner. Pt declined standing with RW at this time. Pt Setup for UB ADLs and up to Mod A for LB ADLs due to deficits. Recommend SNF at DC for short term rehab as pt at higher risk for falls s/p AKA. It is likely that pt will decline SNF and opt for home - would recommend max [facility] services and 24/7 physical assist if that is the case.    Follow Up Recommendations  SNF;Supervision/Assistance - 24 hour    Equipment Recommendations  Other (comment);Tub/shower bench (drop arm BSC)    Recommendations for Other Services       Precautions / Restrictions Precautions Precautions: Fall Restrictions Weight Bearing Restrictions: Yes LLE Weight Bearing: Non weight bearing      Mobility Bed Mobility Overal bed mobility: Modified Independent                  Transfers Overall transfer level: Needs assistance Equipment used: 1 person hand held assist Transfers: Squat Pivot Transfers     Squat pivot transfers: Min guard     General transfer  comment: min guard for squat pivot to recliner from bed    Balance Overall balance assessment: Needs assistance Sitting-balance support: No upper extremity supported;Feet supported Sitting balance-Leahy Scale: Fair Sitting balance - Comments: fair static sitting, posterior lean with dynamic tasks                                   ADL either performed or assessed with clinical judgement   ADL Overall ADL's : Needs assistance/impaired Eating/Feeding: Independent;Sitting   Grooming: Set up;Sitting   Upper Body Bathing: Set up;Sitting   Lower Body Bathing: Moderate assistance;Sit to/from stand   Upper Body Dressing : Set up;Sitting   Lower Body Dressing: Moderate assistance;Sit to/from stand Lower Body Dressing Details (indicate cue type and reason): able to don R sock bringing foot to self in bed     Toileting- Clothing Manipulation and Hygiene: Moderate assistance;Sitting/lateral lean         General ADL Comments: Able to complete LB ADLs with lateral leans and limited assist though will need increased assist for LB ADLs attempted in standing     Vision Patient Visual Report: No change from baseline Vision Assessment?: No apparent visual deficits     Perception     Praxis      Pertinent Vitals/Pain Pain Assessment: Faces Faces Pain Scale: Hurts little more Pain Location: L residual limb Pain Descriptors / Indicators: Sore;Grimacing;Discomfort Pain Intervention(s): Monitored during session;Limited activity within patient's  tolerance     Hand Dominance Right   Extremity/Trunk Assessment Upper Extremity Assessment Upper Extremity Assessment: Overall WFL for tasks assessed   Lower Extremity Assessment Lower Extremity Assessment: Defer to PT evaluation   Cervical / Trunk Assessment Cervical / Trunk Assessment: Kyphotic   Communication Communication Communication: HOH (VERY; did not bring hearing aids to hospital)   Cognition Arousal/Alertness:  Awake/alert Behavior During Therapy: Restless Overall Cognitive Status: History of cognitive impairments - at baseline Area of Impairment: Attention;Memory;Following commands;Awareness;Safety/judgement;Problem solving                   Current Attention Level: Selective Memory: Decreased short-term memory Following Commands: Follows one step commands with increased time Safety/Judgement: Decreased awareness of safety;Decreased awareness of deficits Awareness: Emergent Problem Solving: Difficulty sequencing;Requires verbal cues;Requires tactile cues General Comments: Difficult to fully assess due to pt very HOH. noted deficits in reasoning (as pt asks "why would i bring hearing aides here?") though communication very difficult without them. Pt with memory deficits as PLOF report did not match what spouse reported on phone with OT after session. Pt with decreased awareness of safety/deficits - cues for pacing due to restless and borderline agitation with OOB attempts   General Comments  VSS on RA. OT called spouse on phone after session to clarify PLOF/support at home due to pt very [place] and declines having hearing aides in hospital    Exercises     Shoulder [place] expects to be discharged to:: Private residence Living Arrangements: Spouse/significant other;Other (Comment) (66 y/o grandchildren) Available Help at Discharge: Family Type of Home: House Home Access: Ramped entrance (foldable ramp)     Home Layout: Two level;Able to live on main level with bedroom/bathroom;Other (Comment) (has man cave upstairs)     Bathroom Shower/Tub: Teacher, early years/pre: Standard     Home Equipment: Bedside commode;Walker - 2 wheels;Cane - single point;Wheelchair - manual          Prior Functioning/Environment Level of Independence: Needs assistance  Gait / Transfers Assistance Needed: reports use of wheelchair recently due to L LE  pain. Used can prior to procedure in May though gait abnormal per spouse due to chronic L LE issues. Frequent falls with cane use ADL's / Homemaking Assistance Needed: pt reports independence with ADLs though per wife, she assisted with transfers to/from toilet, changing brief if urine incontinent, bathing and other dressing tasks. Communication / Swallowing Assistance Needed: very HOH          OT Problem List: Decreased strength;Decreased activity tolerance;Impaired balance (sitting and/or standing);Decreased cognition;Decreased safety awareness;Decreased knowledge of use of DME or AE;Pain      OT Treatment/Interventions: Self-care/ADL training;Therapeutic exercise;DME and/or AE instruction;Therapeutic activities;Patient/family education;Balance training    OT Goals(Current goals can be found in the care plan section) Acute Rehab OT Goals Patient Stated Goal: go home OT Goal Formulation: With patient/family Time For Goal Achievement: 07/13/20 Potential to Achieve Goals: Good ADL Goals Pt Will Perform Lower Body Bathing: with supervision;sitting/lateral leans;sit to/from stand Pt Will Perform Lower Body Dressing: with supervision;sit to/from stand;sitting/lateral leans Pt Will Transfer to Toilet: with supervision;squat pivot transfer;stand pivot transfer Pt Will Perform Toileting - Clothing Manipulation and hygiene: with supervision;sit to/from stand;sitting/lateral leans Pt/caregiver will Perform Home Exercise Program: Increased strength;Both right and left upper extremity;With theraband;Independently;With written HEP provided  OT Frequency: Min 2X/week   Barriers to D/C:  Co-evaluation              AM-PAC OT "6 Clicks" Daily Activity     Outcome Measure Help from another person eating meals?: None Help from another person taking care of personal grooming?: A Little Help from another person toileting, which includes using toliet, bedpan, or urinal?: A Lot Help  from another person bathing (including washing, rinsing, drying)?: A Lot Help from another person to put on and taking off regular upper body clothing?: A Little Help from another person to put on and taking off regular lower body clothing?: A Lot 6 Click Score: 16   End of Session Nurse Communication: Mobility status  Activity Tolerance: Patient tolerated treatment well Patient left: in chair;with call bell/phone within reach;with chair alarm set  OT Visit Diagnosis: Unsteadiness on feet (R26.81);Other abnormalities of gait and mobility (R26.89);Muscle weakness (generalized) (M62.81);Pain Pain - Right/Left: Left Pain - part of body: Leg                Time: 1740-8144 OT Time Calculation (min): 28 min Charges:  OT General Charges $OT Visit: 1 Visit OT Evaluation $OT Eval Moderate Complexity: 1 Mod OT Treatments $Therapeutic Activity: 8-22 mins  Malachy Chamber, OTR/L Acute Rehab Services Office: [phone]  Layla Maw 06/29/2020, 2:04 PM

## 2020-06-29 NOTE — Progress Notes (Signed)
PT Cancellation Note  Patient Details Name: Allen Yu MRN: [id number] DOB: 07/10/1945   Cancelled Treatment:    Reason Eval/Treat Not Completed: Medical issues which prohibited therapy.   Noted with elevated d-dimer and new orders for chest CT to rule out PE. Will hold PT eval until chest CT completed. 06/29/2020  Ginger Carne., PT Acute Rehabilitation Services [phone]  (pager) [phone]  (office)   Tessie Fass Klarisa Barman 06/29/2020, 12:12 PM

## 2020-06-29 NOTE — Evaluation (Signed)
Physical Therapy Evaluation Patient Details Name: Allen Yu MRN: [id number] DOB: [birth year] Today's Date: 06/29/2020   History of Present Illness  75 y.o. male who presented with progressive gangrenous changes of L LE. Pt with recent hx of left femoral to above-knee popliteal bypass with Gore-tex on 05/27/2020. 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 PMH: HTN, DM II, PVD, dementia, schizophrenia, PTSF, anemia, CKD, and TB.  Clinical Impression  Pt admitted with/for L LE gangrenous changes s/p L AKA.  Pt able to mobilize at a min guard to min assist level for transfers.  Pt does not see the need to remain here for therapy and care, but agrees to let therapies come to the home..  Pt currently limited functionally due to the problems listed below.  (see problems list.)  Pt will benefit from PT to maximize function and safety to be able to get home safely with available assist.    Follow Up Recommendations Home health PT;Supervision/Assistance - 24 hour    Equipment Recommendations  None recommended by PT    Recommendations for Other Services       Precautions / Restrictions Precautions Precautions: Fall Restrictions Weight Bearing Restrictions: Yes LLE Weight Bearing: Non weight bearing      Mobility  Bed Mobility Overal bed mobility: Modified Independent                  Transfers Overall transfer level: Needs assistance Equipment used: 1 person hand held assist Transfers: Sit to/from Stand Sit to Stand: Min assist   Squat pivot transfers: Min guard     General transfer comment: cues for hand placement/technique and stability assist.  Pt started to pivot in a cluttered environment and was aborted back to bed.  Pt deferred returning back to the chair.  Ambulation/Gait             General Gait Details: pt declined ambulation in the RW  Stairs            Wheelchair Mobility    Modified Rankin (Stroke Patients Only)       Balance Overall balance  assessment: Needs assistance Sitting-balance support: No upper extremity supported;Feet supported Sitting balance-Leahy Scale: Fair Sitting balance - Comments: fair static sitting, doesn't handle challenge to balance well and falls backward.   Standing balance support: Bilateral upper extremity supported Standing balance-Leahy Scale: Poor Standing balance comment: reliant on AD and external support.  Limited assessment                             Pertinent Vitals/Pain Pain Assessment: Faces Faces Pain Scale: Hurts even more Pain Location: back more than L residual limb Pain Descriptors / Indicators: Sore;Grimacing;Discomfort Pain Intervention(s): Monitored during session;Patient requesting pain meds-RN notified    Home Living Family/patient expects to be discharged to:: Private residence Living Arrangements: Spouse/significant other;Other (Comment) (41 y/o grandchildren) Available Help at Discharge: Family Type of Home: House Home Access: Ramped entrance (foldable ramp)     Home Layout: Two level;Able to live on main level with bedroom/bathroom;Other (Comment) (has man cave upstairs) Home Equipment: Bedside commode;Walker - 2 wheels;Cane - single point;Wheelchair - manual      Prior Function Level of Independence: Needs assistance   Gait / Transfers Assistance Needed: reports use of wheelchair recently due to L LE pain. Used can prior to procedure in May though gait abnormal per spouse due to chronic L LE issues. Frequent falls with cane use  ADL's / Homemaking Assistance Needed: pt reports independence with ADLs though per wife, she assisted with transfers to/from toilet, changing brief if urine incontinent, bathing and other dressing tasks.        Hand Dominance   Dominant Hand: Right    Extremity/Trunk Assessment   Upper Extremity Assessment Upper Extremity Assessment: Overall WFL for tasks assessed    Lower Extremity Assessment Lower Extremity  Assessment: Overall WFL for tasks assessed;LLE deficits/detail LLE Deficits / Details: L LE held in extreme hip flexion at rest, but pt able to bring to neutral LLE Coordination: decreased fine motor    Cervical / Trunk Assessment Cervical / Trunk Assessment: Kyphotic  Communication   Communication: HOH (VERY; did not bring hearing aids to hospital)  Cognition Arousal/Alertness: Awake/alert Behavior During Therapy: Agitated Overall Cognitive Status: History of cognitive impairments - at baseline Area of Impairment: Attention;Memory;Following commands;Awareness;Safety/judgement;Problem solving                   Current Attention Level: Selective Memory: Decreased short-term memory Following Commands: Follows one step commands with increased time Safety/Judgement: Decreased awareness of safety;Decreased awareness of deficits Awareness: Emergent Problem Solving: Difficulty sequencing;Requires verbal cues;Requires tactile cues General Comments: Difficulty assessing due to [facility] and pt agitated      General Comments General comments (skin integrity, edema, etc.): Sats on RA in the 90's and HR with mobility climbing into the 110's.    Exercises     Assessment/Plan    PT Assessment Patient needs continued PT services  PT Problem List Decreased strength;Decreased activity tolerance;Decreased mobility;Decreased knowledge of use of DME;Pain       PT Treatment Interventions DME instruction;Gait training;Functional mobility training;Therapeutic activities;Patient/family education    PT Goals (Current goals can be found in the Care Plan section)  Acute Rehab PT Goals Patient Stated Goal: go home PT Goal Formulation: With patient Time For Goal Achievement: 07/13/20 Potential to Achieve Goals: Fair    Frequency Min 3X/week   Barriers to discharge        Co-evaluation               AM-PAC PT "6 Clicks" Mobility  Outcome Measure Help needed turning from your back to  your side while in a flat bed without using bedrails?: None Help needed moving from lying on your back to sitting on the side of a flat bed without using bedrails?: None Help needed moving to and from a bed to a chair (including a wheelchair)?: A Little Help needed standing up from a chair using your arms (e.g., wheelchair or bedside chair)?: A Little Help needed to walk in hospital room?: A Little Help needed climbing 3-5 steps with a railing? : A Lot 6 Click Score: 19    End of Session   Activity Tolerance: Patient tolerated treatment well;Patient limited by pain Patient left: in bed;with call bell/phone within reach Nurse Communication: Mobility status PT Visit Diagnosis: Other abnormalities of gait and mobility (R26.89);Pain;Difficulty in walking, not elsewhere classified (R26.2) Pain - Right/Left: Left Pain - part of body: Leg (back)    Time: 6226-3335 PT Time Calculation (min) (ACUTE ONLY): 24 min   Charges:   PT Evaluation $PT Eval Moderate Complexity: 1 Mod PT Treatments $Therapeutic Activity: 8-22 mins        06/29/2020  Ginger Carne., PT Acute Rehabilitation Services [phone]  (pager) [phone]  (office)  Tessie Fass Ananth Fiallos 06/29/2020, 5:41 PM

## 2020-06-29 NOTE — Progress Notes (Signed)
Text paged on call [facility].MD called back, notified pt has Lantus 45 units, CBG is 102. Didn't eat much dinner, not taking snacks, just had a regular coke. This morning blood sugar dropped to 49 from 209 last night after receiving Lantus. Per MD hold tonight's dose. Will continue to monitor.

## 2020-06-29 NOTE — Progress Notes (Signed)
HOSPITAL MEDICINE OVERNIGHT EVENT NOTE    Patient noted to be in new onset atrial fibrillation earlier this morning, initially in RVR heart rate approximate 120 bpm.  Patient found to be quite hypoglycemic at approximately the same time of the onset of atrial fibrillation.  Hypoglycemia was addressed and corrected however patient remains in atrial fibrillation albeit rate controlled with heart rate averaging in the 90s.  Work-up for potential causes of atrial fibrillation was initiated.  Patient found to have potassium of 3.5 and 40 mill equivalents of potassium have been ordered.  Patient only found to have elevated D-dimer and therefore CT angiogram of the chest has been ordered evaluate for pulmonary embolism.  This will need to be followed closely and anticoagulation initiated (with notification of vascular surgery) if necessary.  CHA2DS2-VASc score is 3.  Again, avoiding bridging anticoagulation therapy for now since patient just underwent surgical intervention yesterday.  Anticoagulation will need to be considered once given approval by surgery.  Of note, urinalysis results at this morning positive for leukocyte esterase and nitrates with somewhat elevated white blood cells per high-powered field.  According to nursing patient has been complaining of dysuria as of late so we will go ahead and initiate intravenous ceftriaxone as we await results of urine culture.    [name]  MD [facility]

## 2020-06-29 NOTE — Progress Notes (Addendum)
  Progress Note    06/29/2020 8:02 AM 1 Day Post-Op  Subjective:  Resting comfortably  Afebrile  Vitals:   06/29/20 0341 06/29/20 0718  BP: (!) 158/86 (!) 164/78  Pulse: (!) 136 92  Resp: 20 (!) 24  Temp: 97.7 F (36.5 C) 98.6 F (37 C)  SpO2: 96% 98%    Physical Exam: Incisions:  Bandage in place and is clean and dry.   CBC    Component Value Date/Time   WBC 17.3 (H) 06/29/2020 0509   RBC 2.76 (L) 06/29/2020 0509   HGB 8.3 (L) 06/29/2020 0509   HCT 24.0 (L) 06/29/2020 0509   HCT 31.3 (L) 07/09/2015 0757   PLT 332 06/29/2020 0509   MCV 87.0 06/29/2020 0509   MCH 30.1 06/29/2020 0509   MCHC 34.6 06/29/2020 0509   RDW 13.8 06/29/2020 0509   LYMPHSABS 0.9 05/17/2020 0842   MONOABS 0.5 05/17/2020 0842   EOSABS 0.1 05/17/2020 0842   BASOSABS 0.0 05/17/2020 0842    BMET    Component Value Date/Time   NA 138 06/29/2020 0509   K 3.5 06/29/2020 0509   CL 100 06/29/2020 0509   CO2 29 06/29/2020 0509   GLUCOSE 101 (H) 06/29/2020 0509   BUN 12 06/29/2020 0509   CREATININE 0.97 06/29/2020 0509   CREATININE 1.10 11/26/2019 0934   CALCIUM 8.9 06/29/2020 0509   GFRNONAA >60 06/29/2020 0509   GFRAA >60 07/10/2019 0702    INR    Component Value Date/Time   INR 1.1 06/28/2020 0554     Intake/Output Summary (Last 24 hours) at 06/29/2020 0802 Last data filed at 06/29/2020 0657 Gross per 24 hour  Intake 1984.73 ml  Output 1350 ml  Net 634.73 ml     Assessment/Plan:  75 y.o. male is s/p left above knee amputation  1 Day Post-Op  -pt's bandage in place and is clean and dry.   -will take down dressing tomorrow.     Leontine Locket, PA-C [facility] [phone] 06/29/2020 8:02 AM    I have examined the patient, reviewed and agree with above.  Curt Jews, MD 06/29/2020 12:21 PM

## 2020-06-30 ENCOUNTER — Other Ambulatory Visit: Payer: Self-pay

## 2020-06-30 DIAGNOSIS — F0391 Unspecified dementia with behavioral disturbance: Secondary | ICD-10-CM

## 2020-06-30 DIAGNOSIS — I480 Paroxysmal atrial fibrillation: Secondary | ICD-10-CM

## 2020-06-30 DIAGNOSIS — N182 Chronic kidney disease, stage 2 (mild): Secondary | ICD-10-CM

## 2020-06-30 DIAGNOSIS — E0822 Diabetes mellitus due to underlying condition with diabetic chronic kidney disease: Secondary | ICD-10-CM

## 2020-06-30 DIAGNOSIS — E1165 Type 2 diabetes mellitus with hyperglycemia: Secondary | ICD-10-CM

## 2020-06-30 LAB — GLUCOSE, CAPILLARY
Glucose-Capillary: 98 mg/dL (ref 70–99)
Glucose-Capillary: 169 mg/dL — ABNORMAL HIGH (ref 70–99)

## 2020-06-30 LAB — SURGICAL PATHOLOGY

## 2020-06-30 MED ORDER — METOPROLOL TARTRATE 25 MG PO TABS: 25.0000 mg | ORAL_TABLET | Freq: Two times a day (BID) | ORAL | 3 refills | Status: DC

## 2020-06-30 MED ORDER — CEPHALEXIN 500 MG PO CAPS: 500.0000 mg | ORAL_CAPSULE | Freq: Three times a day (TID) | ORAL | 0 refills | Status: AC

## 2020-06-30 MED ORDER — LANTUS 100 UNIT/ML ~~LOC~~ SOLN
10.0000 [IU] | Freq: Every evening | SUBCUTANEOUS | 11 refills | Status: DC | PRN
Start: 2020-06-30 — End: 2021-01-19

## 2020-06-30 MED ORDER — ASPIRIN EC 81 MG PO TBEC: DELAYED_RELEASE_TABLET | ORAL | 0 refills | Status: DC

## 2020-06-30 MED ORDER — ENSURE ENLIVE PO LIQD: 237.0000 mL | Freq: Two times a day (BID) | ORAL | 0 refills | Status: AC

## 2020-06-30 MED ORDER — NICOTINE 21 MG/24HR TD PT24: 21.0000 mg | MEDICATED_PATCH | Freq: Every day | TRANSDERMAL | 0 refills | Status: DC | PRN

## 2020-06-30 NOTE — TOC Transition Note (Signed)
Transition of Care (TOC) - CM/SW Discharge Note Marvetta Gibbons RN, BSN Transitions of Care Unit 4E- RN Case Manager See Treatment Team for direct phone #    Patient Details  Name: Allen Yu MRN: [id number] Date of Birth: October 19, 1945  Transition of Care [facility]) CM/SW Contact:  Dawayne Patricia, RN Phone Number: 06/30/2020, 12:31 PM   Clinical Narrative:    Pt has been cleared for transition home today, orders have been placed for HHPT/OT and DME-RW.  As per conversation with wife this AM, referrals have been called to [facility] for DME need- RW to be delivered to room prior to discharge. Call made to [facility] with [facility] for HHPT/OT referral- referral has been accepted.    Final next level of care: [place] Barriers to Discharge: No Barriers Identified   Patient Goals and CMS Choice Patient states their goals for this hospitalization and ongoing recovery are:: pt wants to return home, refusing rehab CMS Medicare.gov Compare Post Acute Care list provided to:: Patient Represenative (must comment) (spouse, brother) Choice offered to / list presented to : Spouse  Discharge Placement                 Home with [facility]      Discharge Plan and Services In-house Referral: Clinical Social Work Discharge Planning Services: CM Consult Post Acute Care Choice: Home Health, Durable Medical Equipment          DME Arranged: Walker rolling DME Agency: [facility] Date DME Agency Contacted: 06/30/20 Time DME Agency Contacted: 29 Representative spoke with at DME Agency: Freda Munro HH Arranged: PT, OT [facility] Agency: [place] ([place]) Date [place]: 06/30/20 Time [place]: 1230 Representative spoke with at [place]: [place] ([place]) Interventions     Readmission Risk Interventions Readmission Risk Prevention Plan 06/30/2020  Post Dischage Appt Complete  Medication Screening Complete  Transportation Screening  Complete  Some recent data might be hidden

## 2020-06-30 NOTE — Plan of Care (Signed)

## 2020-06-30 NOTE — Progress Notes (Signed)
[facility] [place]  Subjective  -no complaints   Objective 132/65 80 98 F (36.7 C) (Oral) 18 96%  Intake/Output Summary (Last 24 hours) at 06/30/2020 0757 Last data filed at 06/30/2020 0500 Gross per 24 hour  Intake 600 ml  Output 1500 ml  Net -900 ml    Left AKA clean dry and intact with staples  Laboratory Lab Results: Recent Labs    06/28/20 0554 06/28/20 1707 06/29/20 0509  WBC 12.4*  --  17.3*  HGB 8.2* 6.9* 8.3*  HCT 24.8* 20.2* 24.0*  PLT 411*  --  332   BMET Recent Labs    06/28/20 0554 06/29/20 0509  NA 134* 138  K 3.8 3.5  CL 98 100  CO2 29 29  GLUCOSE 392* 101*  BUN 12 12  CREATININE 1.37* 0.97  CALCIUM 9.0 8.9    COAG Lab Results  Component Value Date   INR 1.1 06/28/2020   INR 1.0 05/25/2020   INR 1.0 07/10/2019   No results found for: PTT  Assessment/Planning: POD #2 status post left AKA.  Dressing removed this morning and amputation looks good.  Staples will stay for 3 weeks.  No immediate concerns from a vascular standpoint.    Marty Heck 06/30/2020 7:57 AM --

## 2020-06-30 NOTE — Discharge Summary (Signed)
Physician Discharge Summary  Allen Yu FGH:[id number] DOB: 1945-03-25 DOA: 06/28/2020  PCP: Allen Noble, MD  Admit date: 06/28/2020 Discharge date: 06/30/2020  Admitted From: Home Disposition: Home  Recommendations for Outpatient Follow-up:  Follow ups as below. Please obtain CBC/BMP/Mag at follow up Please follow up on the following pending results: None  Home Health: PT/OT Equipment/Devices: Rolling walker  Discharge Condition: Stable CODE STATUS: Full code   Follow-up Information     [facility] Follow up in 5 week(s).   Specialty: Vascular Surgery Contact information: [address] [place] [address] [phone]        Allen Noble, MD. Schedule an appointment as soon as possible for a visit in 1 week(s).   Specialty: Internal Medicine Contact information: [address] [place] [address] [phone]         [facility].   Specialty: Home Health Services Why: HHPT/OT arranged- they will contact you within 48 hr to set up initial home visit- [phone])        [facility] Follow up.   Why: rolling walker arranged- to be delivered to room prior to discharge Contact information: [address] [place] [address] [phone]                  Hospital Course: 75 year old male with PMH of DM-2, dementia, schizophrenia, PTSD, PVD, anemia of chronic disease, CKD, HTN and previously treated TB presenting with worsening left lower extremity heel ulcer and pain and admitted with sepsis due to gangrenous left foot despite left femoropopliteal bypass in May 2022.  Patient was initially started on broad-spectrum antibiotics.  Vascular surgery consulted.  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  He has been cleared for discharge by vascular surgery.  Therapy recommended SNF but patient refused SNF and opted to go home.  Patient's wife  is not happy with patient's choice but agreed to take him home with home health and DME.   Hospital course noteworthy of provoked but self limited A. fib with RVR for which he has been discharged on metoprolol.  Anticoagulation has been deferred due to high risk for fall and bleeding.     Patient had elevated D-dimer.  CTA chest negative for PE but about 1.6 cm RUL mass. On further review of his chart, he has history tuberculosis for which he was treated with residual cavitary lesion in RUL. However, he needs follow up on this to exclude malignancy.    Patient's urine culture grew 100,000 colonies of GNR.  Reportedly, he had some dysuria earlier in the course although he denied this to me on the day of discharge.  He has been treated with IV ceftriaxone, and discharged on p.o. Keflex to complete treatment course.  See individual problem list below for more on hospital course.  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 -Patient refused SNF and discharged home with home health and DME.   New onset A. fib, likely provoked in the setting of above: Converted to sinus rhythm. -Started low-dose metoprolol -Deferred anticoagulation due to high risk for fall and low A. fib burden   Elevated D-dimer: CTA chest negative for PE.   Uncontrolled hypertension: Improved. -Continue home medications -Added low-dose metoprolol in the setting of A. fib   AKI: Resolved. Recent Labs    05/19/20 0236 05/20/20 0548 05/21/20  5537 05/25/20 4827 05/27/20 0840 05/27/20 0952 05/27/20 1125 05/27/20 1324 05/29/20 0044 06/28/20 0554 06/29/20 0509  BUN 17 10 11 11 17 17 15   --  10 12 12   CREATININE 1.31* 1.26* 1.28* 1.32* 1.20 1.10 1.00 1.17 1.05 1.37* 0.97  -Recheck renal function at follow-up.   ABLA on anemia of chronic disease: Transfused 1 unit with appropriate response. Recent  Labs    05/27/20 0840 05/27/20 0845 05/27/20 0952 05/27/20 1125 05/27/20 1324 05/28/20 0028 05/29/20 0044 06/28/20 0554 06/28/20 1707 06/29/20 0509  HGB 5.8* 6.4* 6.5* 8.5* 9.8* 10.0* 9.4* 8.2* 6.9* 8.3*  -Recheck CBC at follow-up  Uncontrolled IDDM-2 with hyperglycemia and hypoglycemia: A1c 9.2%.  Per wife, noncompliant No results for input(s): HGBA1C in the last 72 hours. Recent Labs  Lab 06/29/20 1129 06/29/20 1623 06/29/20 1939 06/30/20 0616 06/30/20 1144  GLUCAP 67* 86 102* 98 169*  -Decreased home Lantus to 10 units -Continue home NovoLog    Peripheral vascular disease: -Continue aspirin and statin   Hyperlipidemia -Continue atorvastatin 40 mg nightly   Dementia with behavioral disturbance: -Continue Aricept   Thrombocytosis: Resolved. Recent Labs  Lab 06/28/20 0554 06/29/20 0509  PLT 411* 332   Severe protein calorie malnutrition: BMI 16.2 kg/m.   -Patient significantly underweight and cachectic. -Continue Ensure Enlive with meals   History of tuberculosis: previously treated. -CTA 06/29/2020 shows right upper lobe nodule -unclear if this is related to history of TB or something more insidious, repeat imaging per outpatient guidelines  Tobacco use disorder: -Cessation counseling -Nicotine patch.   Severe protein calorie malnutriti Body mass index is 16.78 kg/m. -Continue Ensure Enlive and multivitamins            Discharge Exam: Vitals:   06/30/20 0343 06/30/20 0902  BP: 132/65 (!) 153/92  Pulse: 80 100  Resp: 18 (!) 21  Temp: 98 F (36.7 C) 98 F (36.7 C)  SpO2: 96% 99%    GENERAL: No apparent distress.  Nontoxic. HEENT: MMM.  Vision and hearing grossly intact.  NECK: Supple.  No apparent JVD.  RESP: On RA.  No IWOB.  Fair aeration bilaterally. CVS:  RRR. Heart sounds normal.  ABD/GI/GU: Bowel sounds present. Soft. Non tender.  MSK/EXT: Left AKA.  Surgical wound clean and dry.  Staples in place. SKIN: Surgical wound over left  AKA clean and dry.  Staples in place. NEURO: Awake, alert and oriented to self and place.  No apparent focal neuro deficit. PSYCH: Calm. Normal affect.  Discharge Instructions  Discharge Instructions     Call MD for:  difficulty breathing, headache or visual disturbances   Complete by: As directed    Call MD for:  extreme fatigue   Complete by: As directed    Call MD for:  severe uncontrolled pain   Complete by: As directed    Call MD for:  temperature >100.4   Complete by: As directed    Diet Carb Modified   Complete by: As directed    Discharge instructions   Complete by: As directed    It has been a pleasure taking care of you!  You were hospitalized due to left heel ulcer and infection for which you were treated surgically medically.  Your urine also shows some bacteria concerning for urinary tract infection.  We are discharging you on antibiotics to complete treatment course for this.  Your CT chest also shows a lung nodule concerning for lung cancer.  We recommend you follow-up with your primary care doctor to  discuss further evaluation and possible referral to oncology.  You also had atrial fibrillation (irregular and fast heart rate) during this hospitalization.  This could be due to stress from the surgery and acute illness.  We are discharging you on medication to regulate your heart (metoprolol).  Atrial fibrillation could increase risk of stroke.  Usually people should be on a blood thinner.  However, we did not start you on blood thinner due to increased risk of fall and bleeding. You may discuss about about risk and benefit of blood thinner with your primary care doctor once you recover from your current surgery.  Meanwhile, continue taking aspirin as prescribed which could help to some extent.  Please review your new medication list and the directions on your medications before you take them.  It is important that you quit smoking cigarettes.  You may use nicotine patch to  help you quit smoking.  Nicotine patch is available over-the-counter.  You may also discuss other options to help you quit smoking with your primary care doctor. You can also talk to professional counselors at 1-800-QUIT-NOW [phone]) for free smoking cessation counseling.    Vascular surgery will arrange outpatient follow-up for staple removal and wound check.   Take care,   Increase activity slowly   Complete by: As directed    No wound care   Complete by: As directed       Allergies as of 06/30/2020       Reactions   Bee Venom Anaphylaxis   Codeine Other (See Comments)   incoherent    Diovan [valsartan] Other (See Comments)   incoherent   Propoxyphene Other (See Comments)   Dizziness, "Makes me feel drunk"        Medication List     TAKE these medications    acetaminophen 500 MG tablet Commonly known as: TYLENOL Take 500 mg by mouth every 6 (six) hours as needed for moderate pain.   aspirin EC 81 MG tablet Take 1 tablet (81 mg total) by mouth 2 (two) times daily for 30 days, THEN 1 tablet (81 mg total) daily. Start taking on: June 30, 2020 What changed: See the new instructions.   atorvastatin 40 MG tablet Commonly known as: LIPITOR Take 1 tablet (40 mg total) by mouth at bedtime.   B-D UF III MINI PEN NEEDLES 31G X 5 MM Misc Generic drug: Insulin Pen Needle SMARTSIG:1 Each SUB-Q Daily   cephALEXin 500 MG capsule Commonly known as: KEFLEX Take 1 capsule (500 mg total) by mouth 3 (three) times daily for 4 days.   donepezil 10 MG tablet Commonly known as: ARICEPT Take 10 mg by mouth at bedtime.   feeding supplement Liqd Take 237 mLs by mouth 2 (two) times daily between meals.   gabapentin 100 MG capsule Commonly known as: NEURONTIN Take 100 mg by mouth at bedtime.   insulin lispro 100 UNIT/ML injection Commonly known as: HUMALOG Inject 5-10 Units into the skin daily as needed for high blood sugar.   Lantus 100 UNIT/ML injection Generic drug:  insulin glargine Inject 0.1 mLs (10 Units total) into the skin at bedtime as needed (High blood glucose). What changed: how much to take   losartan 50 MG tablet Commonly known as: COZAAR Take 50 mg by mouth at bedtime.   metoprolol tartrate 25 MG tablet Commonly known as: LOPRESSOR Take 1 tablet (25 mg total) by mouth 2 (two) times daily.   multivitamin with minerals tablet Take 1 tablet by mouth daily. Juab  plus   nicotine 21 mg/24hr patch Commonly known as: NICODERM CQ - dosed in mg/24 hours Place 1 patch (21 mg total) onto the skin daily as needed (Cessation of smoking).   oxyCODONE-acetaminophen 5-325 MG tablet Commonly known as: PERCOCET/ROXICET Take 1 tablet by mouth every 4 (four) hours as needed for severe pain.               Durable Medical Equipment  (From admission, onward)           Start     Ordered   06/30/20 1201  For home use only DME Walker rolling  Once       Question Answer Comment  Walker: With 5 Inch Wheels   Patient needs a walker to treat with the following condition Unsteady gait      06/30/20 1200            Consultations: Vascular surgery  Procedures/Studies: 6/7-left AKA   CT Angio Chest Pulmonary Embolism (PE) W or WO Contrast  Result Date: 06/29/2020 CLINICAL DATA:  Rule out pulmonary embolus.  Elevated D-dimer EXAM: CT ANGIOGRAPHY CHEST WITH CONTRAST TECHNIQUE: Multidetector CT imaging of the chest was performed using the standard protocol during bolus administration of intravenous contrast. Multiplanar CT image reconstructions and MIPs were obtained to evaluate the vascular anatomy. CONTRAST:  74mL OMNIPAQUE IOHEXOL 350 MG/ML SOLN COMPARISON:  CT chest 06/30/2019 FINDINGS: Cardiovascular: Satisfactory opacification of the pulmonary arteries to the segmental level. No evidence of pulmonary embolism. Normal heart size. Aortic atherosclerosis. Coronary artery calcifications. No pericardial effusion. Mediastinum/Nodes: No enlarged  mediastinal, hilar, or axillary lymph nodes. Thyroid gland, trachea, and esophagus demonstrate no significant findings. Lungs/Pleura: Moderate to advanced changes of centrilobular and paraseptal emphysema. Within the right upper lobe there is a solid-appearing nodule which measures 1.6 x 1.4 by 1.4 cm, image 32/3 Focal area of subpleural consolidation is noted within the posteromedial left base, image 74/7. Small left pleural effusion is present. Upper Abdomen: No acute abnormality. Musculoskeletal: No chest wall abnormality. No acute or significant osseous findings. Review of the MIP images confirms the above findings. IMPRESSION: 1. No evidence for acute pulmonary embolus. 2. Right upper lobe pulmonary nodule is identified measuring up to 1.6 cm. Suspicious for primary bronchogenic carcinoma. Further evaluation with PET-CT as warranted. 3. Subpleural consolidation in the posterior left base with small left pleural effusion. Favor inflammatory or infectious etiology. A 3 month, short-term interval follow-up CT of the chest is recommended to ensure resolution. This could also be readdressed at PET-CT. 4. Aortic Atherosclerosis (ICD10-I70.0) and Emphysema (ICD10-J43.9). 5. Coronary artery calcifications. Electronically Signed   By: Kerby Moors M.D.   On: 06/29/2020 09:59   ECHOCARDIOGRAM COMPLETE  Result Date: 06/29/2020    ECHOCARDIOGRAM REPORT   Patient Name:   Allen Yu Date of Exam: 06/29/2020 Medical Rec #:  [id number]      Height:       73.0 in Accession #:    [id number]     Weight:       127.2 lb Date of Birth:  10/11/1945     BSA:          1.775 m Patient Age:    66 years       BP:           159/83 mmHg Patient Gender: M              HR:           95 bpm. Exam Location:  Inpatient Procedure: 2D Echo, Cardiac Doppler and Color Doppler Indications:    I48.91* Unspeicified atrial fibrillation  History:        Patient has prior history of Echocardiogram examinations, most                 recent  07/10/2015. PAD. One day post BKA.  Sonographer:    Merrie Roof RDCS Referring Phys: [place]  1. Left ventricular ejection fraction, by estimation, is 60 to 65%. The left ventricle has normal function. The left ventricle has no regional wall motion abnormalities. Left ventricular diastolic parameters are indeterminate.  2. Right ventricular systolic function is normal. The right ventricular size is normal. There is normal pulmonary artery systolic pressure. The estimated right ventricular systolic pressure is 90.2 mmHg.  3. Left atrial size was mildly dilated.  4. Right atrial size was mildly dilated.  5. The mitral valve is normal in structure. Mild mitral valve regurgitation. No evidence of mitral stenosis.  6. The aortic valve is normal in structure. Aortic valve regurgitation is not visualized. Mild to moderate aortic valve sclerosis/calcification is present, without any evidence of aortic stenosis.  7. The inferior vena cava is normal in size with greater than 50% respiratory variability, suggesting right atrial pressure of 3 mmHg. FINDINGS  Left Ventricle: Left ventricular ejection fraction, by estimation, is 60 to 65%. The left ventricle has normal function. The left ventricle has no regional wall motion abnormalities. The left ventricular internal cavity size was normal in size. There is  no left ventricular hypertrophy. Left ventricular diastolic parameters are indeterminate. Right Ventricle: The right ventricular size is normal. No increase in right ventricular wall thickness. Right ventricular systolic function is normal. There is normal pulmonary artery systolic pressure. The tricuspid regurgitant velocity is 2.66 m/s, and  with an assumed right atrial pressure of 3 mmHg, the estimated right ventricular systolic pressure is 40.9 mmHg. Left Atrium: Left atrial size was mildly dilated. Right Atrium: Right atrial size was mildly dilated. Pericardium: There is no evidence of  pericardial effusion. Mitral Valve: The mitral valve is normal in structure. Mild mitral valve regurgitation. No evidence of mitral valve stenosis. Tricuspid Valve: The tricuspid valve is normal in structure. Tricuspid valve regurgitation is mild . No evidence of tricuspid stenosis. Aortic Valve: The aortic valve is normal in structure. Aortic valve regurgitation is not visualized. Mild to moderate aortic valve sclerosis/calcification is present, without any evidence of aortic stenosis. Aortic valve mean gradient measures 4.0 mmHg. Aortic valve peak gradient measures 7.8 mmHg. Aortic valve area, by VTI measures 2.01 cm. Pulmonic Valve: The pulmonic valve was normal in structure. Pulmonic valve regurgitation is not visualized. No evidence of pulmonic stenosis. Aorta: The aortic root is normal in size and structure. Venous: The inferior vena cava is normal in size with greater than 50% respiratory variability, suggesting right atrial pressure of 3 mmHg. IAS/Shunts: No atrial level shunt detected by color flow Doppler.  LEFT VENTRICLE PLAX 2D LVIDd:         5.10 cm LVIDs:         3.80 cm LV PW:         1.00 cm LV IVS:        1.00 cm LVOT diam:     2.00 cm LV SV:         54 LV SV Index:   30 LVOT Area:     3.14 cm  RIGHT VENTRICLE  IVC RV Basal diam:  3.90 cm  IVC diam: 1.60 cm LEFT ATRIUM             Index       RIGHT ATRIUM           Index LA diam:        3.80 cm 2.14 cm/m  RA Area:     20.00 cm LA Vol (A2C):   69.7 ml 39.26 ml/m RA Volume:   59.60 ml  33.57 ml/m LA Vol (A4C):   69.6 ml 39.21 ml/m LA Biplane Vol: 72.4 ml 40.78 ml/m  AORTIC VALVE AV Area (Vmax):    2.05 cm AV Area (Vmean):   2.12 cm AV Area (VTI):     2.01 cm AV Vmax:           140.00 cm/s AV Vmean:          92.800 cm/s AV VTI:            0.267 m AV Peak Grad:      7.8 mmHg AV Mean Grad:      4.0 mmHg LVOT Vmax:         91.40 cm/s LVOT Vmean:        62.500 cm/s LVOT VTI:          0.171 m LVOT/AV VTI ratio: 0.64  AORTA Ao Root diam:  3.60 cm TRICUSPID VALVE TR Peak grad:   28.3 mmHg TR Vmax:        266.00 cm/s  SHUNTS Systemic VTI:  0.17 m Systemic Diam: 2.00 cm Candee Furbish MD Electronically signed by Candee Furbish MD Signature Date/Time: 06/29/2020/5:23:45 PM    Final        The results of significant diagnostics from this hospitalization (including imaging, microbiology, ancillary and laboratory) are listed below for reference.     Microbiology: Recent Results (from the past 240 hour(s))  SARS CORONAVIRUS 2 (TAT 6-24 HRS) Nasopharyngeal Nasopharyngeal Swab     Status: None   Collection Time: 06/27/20  2:01 PM   Specimen: Nasopharyngeal Swab  Result Value Ref Range Status   SARS Coronavirus 2 NEGATIVE NEGATIVE Final    Comment: (NOTE) SARS-CoV-2 target nucleic acids are NOT DETECTED.  The SARS-CoV-2 RNA is generally detectable in upper and lower respiratory specimens during the acute phase of infection. Negative results do not preclude SARS-CoV-2 infection, do not rule out co-infections with other pathogens, and should not be used as the sole basis for treatment or other patient management decisions. Negative results must be combined with clinical observations, patient history, and epidemiological information. The expected result is Negative.  Fact Sheet for Patients: SugarRoll.be  Fact Sheet for Healthcare Providers: https://www.woods-mathews.com/  This test is not yet approved or cleared by the [place] FDA and  has been authorized for detection and/or diagnosis of SARS-CoV-2 by FDA under an Emergency Use Authorization (EUA). This EUA will remain  in effect (meaning this test can be used) for the duration of the COVID-19 declaration under Se ction 564(b)(1) of the Act, 21 U.S.C. section 360bbb-3(b)(1), unless the authorization is terminated or revoked sooner.  Performed at [facility], [place] [address] [place], [place] [address]   Culture, Urine      Status: Abnormal (Preliminary result)   Collection Time: 06/29/20  7:05 AM   Specimen: Urine, Random  Result Value Ref Range Status   Specimen Description URINE, RANDOM  Final   Special Requests   Final    NONE Performed at  [facility], [place] [address], [place], [place] [address]    Culture >=100,000 COLONIES/mL KLEBSIELLA PNEUMONIAE (A)  Final   Report Status PENDING  Incomplete     Labs:  CBC: Recent Labs  Lab 06/28/20 0554 06/28/20 1707 06/29/20 0509  WBC 12.4*  --  17.3*  HGB 8.2* 6.9* 8.3*  HCT 24.8* 20.2* 24.0*  MCV 90.2  --  87.0  PLT 411*  --  332   BMP &GFR Recent Labs  Lab 06/28/20 0554 06/29/20 0509  NA 134* 138  K 3.8 3.5  CL 98 100  CO2 29 29  GLUCOSE 392* 101*  BUN 12 12  CREATININE 1.37* 0.97  CALCIUM 9.0 8.9  MG  --  1.9   Estimated Creatinine Clearance: 54.5 mL/min (by C-G formula based on SCr of 0.97 mg/dL). Liver & Pancreas: Recent Labs  Lab 06/28/20 0554 06/29/20 0509  AST 23 33  ALT 17 17  ALKPHOS 152* 115  BILITOT 0.7 1.3*  PROT 6.4* 6.0*  ALBUMIN 2.7* 2.6*   No results for input(s): LIPASE, AMYLASE in the last 168 hours. No results for input(s): AMMONIA in the last 168 hours. Diabetic: No results for input(s): HGBA1C in the last 72 hours. Recent Labs  Lab 06/29/20 1129 06/29/20 1623 06/29/20 1939 06/30/20 0616 06/30/20 1144  GLUCAP 67* 86 102* 98 169*   Cardiac Enzymes: No results for input(s): CKTOTAL, CKMB, CKMBINDEX, TROPONINI in the last 168 hours. No results for input(s): PROBNP in the last 8760 hours. Coagulation Profile: Recent Labs  Lab 06/28/20 0554  INR 1.1   Thyroid Function Tests: Recent Labs    06/29/20 0509  TSH 1.089   Lipid Profile: No results for input(s): CHOL, HDL, LDLCALC, TRIG, CHOLHDL, LDLDIRECT in the last 72 hours. Anemia Panel: No results for input(s): VITAMINB12, FOLATE, FERRITIN, TIBC, IRON, RETICCTPCT in the last 72 hours. Urine analysis:    Component Value Date/Time    COLORURINE YELLOW 06/29/2020 0519   APPEARANCEUR HAZY (A) 06/29/2020 0519   LABSPEC 1.014 06/29/2020 0519   PHURINE 5.0 06/29/2020 0519   GLUCOSEU >=500 (A) 06/29/2020 0519   HGBUR MODERATE (A) 06/29/2020 0519   BILIRUBINUR NEGATIVE 06/29/2020 0519   KETONESUR 5 (A) 06/29/2020 0519   PROTEINUR 100 (A) 06/29/2020 0519   UROBILINOGEN 0.2 02/18/2014 0950   NITRITE POSITIVE (A) 06/29/2020 0519   LEUKOCYTESUR MODERATE (A) 06/29/2020 0519   Sepsis Labs: Invalid input(s): PROCALCITONIN, LACTICIDVEN   Time coordinating discharge: 45 minutes  SIGNED:  Mercy Riding, MD  [facility] 06/30/2020, 3:48 PM  If 7PM-7AM, please contact night-coverage [email or web address]

## 2020-06-30 NOTE — TOC Initial Note (Addendum)
Transition of Care (TOC) - Initial/Assessment Note  Marvetta Gibbons RN, BSN Transitions of Care Unit 4E- RN Case Manager See Treatment Team for direct phone #    Patient Details  Name: Allen Yu MRN: [id number] Date of Birth: 1945-06-28  Transition of Care [facility]) CM/SW Contact:    Dawayne Patricia, RN Phone Number: 06/30/2020, 11:39 AM  Clinical Narrative:                 Pt s/p AKA, from home with wife. Spoke with wife and brother at the request of wife regarding transition needs. Pt would benefit from rehab however is refusing rehab and wanting to return to his home. Explained that we can not send patient to rehab without him consenting to go.  Pt has had HH in the past per wife, ([place], [place]) and per wife has a w/c and BSC at home. His RW is one that someone gave him and wife states he needs a new one.  Had discussion on [facility] services in the home and what that would look like, average of 2 visits per week lasting about 30-45 min per visit. Explained that no one would be coming daily to assist and daily care would fall to her and any family that could assist. Pt is a veteran and wife states that patient has an appointment in July to establish care with the [place]. Once established then pt may be eligible for assistance with the [place] for aides, etc. Wife has already spoken with someone at the [place].   Discussed choice for [facility] services, per wife she would like to use either [facility] or [facility] as first choices (would prefer not to use [facility]) open to any others if [facility] and [facility] unable to accept.   Wife reports that she and brother will transport home.  Address, phone #s, and PCP all confirmed with wife in epic.  [facility] preferred [facility].   Pt will need orders for [facility] and DME-RW, CM will make referrals once orders have been placed.   Expected Discharge Plan: [place] Barriers to Discharge: No Barriers Identified   Patient Goals and CMS Choice Patient states their  goals for this hospitalization and ongoing recovery are:: pt wants to return home, refusing rehab CMS Medicare.gov Compare Post Acute Care list provided to:: Patient Represenative (must comment) (spouse, brother) Choice offered to / list presented to : Spouse  Expected Discharge Plan and Services Expected Discharge Plan: [place] In-house Referral: Clinical Social Work Discharge Planning Services: CM Consult Post Acute Care Choice: Home Health, Durable Medical Equipment Living arrangements for the past 2 months: Single Family Home                 DME Arranged: Walker rolling DME Agency: [employer]                  Prior Living Arrangements/Services Living arrangements for the past 2 months: Single Family Home Lives with:: Spouse Patient language and need for interpreter reviewed:: Yes Do you feel safe going back to the place where you live?: Yes      Need for Family Participation in Patient Care: Yes (Comment) Care giver support system in place?: Yes (comment) Current home services: DME (w/c, BSC) Criminal Activity/Legal Involvement Pertinent to Current Situation/Hospitalization: No - Comment as needed  Activities of Daily Living   ADL Screening (condition at time of admission) Patient's cognitive ability adequate to safely complete daily activities?: Yes Is  the patient deaf or have difficulty hearing?: Yes Does the patient have difficulty seeing, even when wearing glasses/contacts?: No Does the patient have difficulty concentrating, remembering, or making decisions?: No Patient able to express need for assistance with ADLs?: Yes Does the patient have difficulty dressing or bathing?: Yes Independently performs ADLs?: No Communication: Independent Dressing (OT): Needs assistance Grooming: Needs assistance Is this a change from baseline?: Pre-admission baseline Feeding: Independent Bathing: Needs assistance Toileting: Needs assistance Does the  patient have difficulty walking or climbing stairs?: Yes Weakness of Legs: Both Weakness of Arms/Hands: None  Permission Sought/Granted Permission sought to share information with : Facility Art therapist granted to share information with : Yes, Verbal Permission Granted     Permission granted to share info w AGENCY: DME/HH        Emotional Assessment Appearance:: Appears stated age Attitude/Demeanor/Rapport: Angry Affect (typically observed): Angry Orientation: : Oriented to Self, Oriented to Place, Oriented to  Time, Oriented to Situation Alcohol / Substance Use: Alcohol Use Psych Involvement: No (comment)  Admission diagnosis:  S/P AKA (above knee amputation) unilateral, left ([place]) [T77.116] Patient Active Problem List   Diagnosis Date Noted   S/P AKA (above knee amputation) unilateral, left ([place]) 06/28/2020   Gangrene of left foot ([place]) 06/28/2020   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 ([place]) 05/27/2020   Critical lower limb ischemia ([place]) 05/21/2020   Protein-calorie malnutrition, severe 05/19/2020   Plantar callus 12/24/2019   Cavitary pneumonia 09/14/2019   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   Unintentional weight loss 09/14/2019   Coronary artery calcification seen on CT scan 10/28/2018   Hilar adenopathy 04/10/2016   Lung nodule seen on imaging study 07/09/2015   Acute encephalopathy    Hypoglycemia due to insulin    Hypertension 07/08/2015   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[place]) 07/08/2015   Acute kidney injury superimposed on chronic kidney disease ([place]) 07/08/2015   Normocytic anemia 07/08/2015   Tobacco use disorder 07/08/2015   Diverticulosis of colon without hemorrhage    Left inguinal hernia 09/16/2013   Altered mental status 11/17/2012   Hypothermia 11/17/2012   Leukocytosis 11/17/2012   CKD (chronic kidney disease), stage II    PTSD (post-traumatic stress disorder)    GERD (gastroesophageal  reflux disease)    PCP:  [name], MD Pharmacy:   [facility] - [address], [place] [address] Phone: [phone] Fax: [place] [phone] - [place] - [address] AT [address] [place] [address] Phone: [phone] Fax: [phone]     Social Determinants of Health (SDOH) Interventions    Readmission Risk Interventions No flowsheet data found.

## 2020-06-30 NOTE — Progress Notes (Signed)
Mobility Specialist: Progress Note   06/30/20 1134  Mobility  Activity Refused mobility   Pt refused mobility stating he doesn't want to do therapy and that he is going home today regardless what we say, RN notified. Pt c/o chronic back pain and pain in L stump, RN notified.   [facility] Adria Costley Mobility Specialist Mobility Specialist Phone: [phone]

## 2020-07-01 ENCOUNTER — Telehealth: Payer: Self-pay

## 2020-07-01 ENCOUNTER — Other Ambulatory Visit: Payer: Self-pay | Admitting: Physician Assistant

## 2020-07-01 LAB — URINE CULTURE: Culture: >=100000 — AB

## 2020-07-01 MED ORDER — OXYCODONE-ACETAMINOPHEN 5-325 MG PO TABS: 1.0000 | ORAL_TABLET | Freq: Four times a day (QID) | ORAL | 0 refills | Status: DC | PRN

## 2020-07-01 NOTE — Telephone Encounter (Signed)
Patient's wife called to ask about pain medicine. Patient is s/p aka on 06/28/2020. Was not discharged with RX. Confirmed with PA & PDMP and sent in pain med rx.

## 2020-07-02 DIAGNOSIS — I129 Hypertensive chronic kidney disease with stage 1 through stage 4 chronic kidney disease, or unspecified chronic kidney disease: Secondary | ICD-10-CM | POA: Diagnosis not present

## 2020-07-02 DIAGNOSIS — F17210 Nicotine dependence, cigarettes, uncomplicated: Secondary | ICD-10-CM | POA: Diagnosis not present

## 2020-07-02 DIAGNOSIS — G8929 Other chronic pain: Secondary | ICD-10-CM | POA: Diagnosis not present

## 2020-07-02 DIAGNOSIS — M545 Low back pain, unspecified: Secondary | ICD-10-CM | POA: Diagnosis not present

## 2020-07-02 DIAGNOSIS — E1165 Type 2 diabetes mellitus with hyperglycemia: Secondary | ICD-10-CM | POA: Diagnosis not present

## 2020-07-02 DIAGNOSIS — Z89612 Acquired absence of left leg above knee: Secondary | ICD-10-CM | POA: Diagnosis not present

## 2020-07-02 DIAGNOSIS — N390 Urinary tract infection, site not specified: Secondary | ICD-10-CM | POA: Diagnosis not present

## 2020-07-02 DIAGNOSIS — Z4781 Encounter for orthopedic aftercare following surgical amputation: Secondary | ICD-10-CM | POA: Diagnosis not present

## 2020-07-02 DIAGNOSIS — R911 Solitary pulmonary nodule: Secondary | ICD-10-CM | POA: Diagnosis not present

## 2020-07-02 DIAGNOSIS — I4891 Unspecified atrial fibrillation: Secondary | ICD-10-CM | POA: Diagnosis not present

## 2020-07-02 DIAGNOSIS — K5900 Constipation, unspecified: Secondary | ICD-10-CM | POA: Diagnosis not present

## 2020-07-02 DIAGNOSIS — E43 Unspecified severe protein-calorie malnutrition: Secondary | ICD-10-CM | POA: Diagnosis not present

## 2020-07-02 DIAGNOSIS — E1122 Type 2 diabetes mellitus with diabetic chronic kidney disease: Secondary | ICD-10-CM | POA: Diagnosis not present

## 2020-07-02 DIAGNOSIS — F209 Schizophrenia, unspecified: Secondary | ICD-10-CM | POA: Diagnosis not present

## 2020-07-02 DIAGNOSIS — A419 Sepsis, unspecified organism: Secondary | ICD-10-CM | POA: Diagnosis not present

## 2020-07-02 DIAGNOSIS — E785 Hyperlipidemia, unspecified: Secondary | ICD-10-CM | POA: Diagnosis not present

## 2020-07-02 DIAGNOSIS — N182 Chronic kidney disease, stage 2 (mild): Secondary | ICD-10-CM | POA: Diagnosis not present

## 2020-07-02 DIAGNOSIS — F4310 Post-traumatic stress disorder, unspecified: Secondary | ICD-10-CM | POA: Diagnosis not present

## 2020-07-02 DIAGNOSIS — K219 Gastro-esophageal reflux disease without esophagitis: Secondary | ICD-10-CM | POA: Diagnosis not present

## 2020-07-02 DIAGNOSIS — E1152 Type 2 diabetes mellitus with diabetic peripheral angiopathy with gangrene: Secondary | ICD-10-CM | POA: Diagnosis not present

## 2020-07-02 DIAGNOSIS — E1142 Type 2 diabetes mellitus with diabetic polyneuropathy: Secondary | ICD-10-CM | POA: Diagnosis not present

## 2020-07-02 DIAGNOSIS — D631 Anemia in chronic kidney disease: Secondary | ICD-10-CM | POA: Diagnosis not present

## 2020-07-02 DIAGNOSIS — M1990 Unspecified osteoarthritis, unspecified site: Secondary | ICD-10-CM | POA: Diagnosis not present

## 2020-07-02 DIAGNOSIS — D62 Acute posthemorrhagic anemia: Secondary | ICD-10-CM | POA: Diagnosis not present

## 2020-07-02 DIAGNOSIS — F0390 Unspecified dementia without behavioral disturbance: Secondary | ICD-10-CM | POA: Diagnosis not present

## 2020-07-04 DIAGNOSIS — A419 Sepsis, unspecified organism: Secondary | ICD-10-CM | POA: Diagnosis not present

## 2020-07-04 DIAGNOSIS — N390 Urinary tract infection, site not specified: Secondary | ICD-10-CM | POA: Diagnosis not present

## 2020-07-04 DIAGNOSIS — E1152 Type 2 diabetes mellitus with diabetic peripheral angiopathy with gangrene: Secondary | ICD-10-CM | POA: Diagnosis not present

## 2020-07-04 DIAGNOSIS — E1122 Type 2 diabetes mellitus with diabetic chronic kidney disease: Secondary | ICD-10-CM | POA: Diagnosis not present

## 2020-07-04 DIAGNOSIS — Z4781 Encounter for orthopedic aftercare following surgical amputation: Secondary | ICD-10-CM | POA: Diagnosis not present

## 2020-07-04 DIAGNOSIS — I129 Hypertensive chronic kidney disease with stage 1 through stage 4 chronic kidney disease, or unspecified chronic kidney disease: Secondary | ICD-10-CM | POA: Diagnosis not present

## 2020-07-05 ENCOUNTER — Other Ambulatory Visit: Payer: Self-pay | Admitting: *Deleted

## 2020-07-05 DIAGNOSIS — N390 Urinary tract infection, site not specified: Secondary | ICD-10-CM | POA: Diagnosis not present

## 2020-07-05 DIAGNOSIS — E1152 Type 2 diabetes mellitus with diabetic peripheral angiopathy with gangrene: Secondary | ICD-10-CM | POA: Diagnosis not present

## 2020-07-05 DIAGNOSIS — I129 Hypertensive chronic kidney disease with stage 1 through stage 4 chronic kidney disease, or unspecified chronic kidney disease: Secondary | ICD-10-CM | POA: Diagnosis not present

## 2020-07-05 DIAGNOSIS — A419 Sepsis, unspecified organism: Secondary | ICD-10-CM | POA: Diagnosis not present

## 2020-07-05 DIAGNOSIS — E1122 Type 2 diabetes mellitus with diabetic chronic kidney disease: Secondary | ICD-10-CM | POA: Diagnosis not present

## 2020-07-05 DIAGNOSIS — Z4781 Encounter for orthopedic aftercare following surgical amputation: Secondary | ICD-10-CM | POA: Diagnosis not present

## 2020-07-05 NOTE — Patient Outreach (Signed)
[place] [facility]) Care Management  07/05/2020  KOHEN REITHER [birth year] [id number]  [facility] Telephone Assessment/Screen for post hospital/snf/complex care referral  Referral Date: 07/01/20 Referral Source: [facility] hospital liaison Referral Reason: Disease managment services needed: Nurse Case Manager,post surgery, Diabetes Insurance: tricare for life, Medicare   Outreach attempt #1 successful at [phone] Patient's [name] is able to verify HIPAA, DOB and address Reviewed and addressed referral to [facility] with patient's wife sharon as he has dementia Consent: [facility] RN CM reviewed [facility] services with [name]. She gave verbal consent for services [facility] telephonic RN CM.    Patient goal interventions Continue to work with [facility] PT/OT  [facility] RN CM care coordination Outreach to [facility] pharmacy [phone] on insulin concerns reported since discharge    [facility] RN CM disease management/education  Pending further outreach  [facility] progression:  initial assessment completed  Social: 75 yr old male lives at home with [name] and also assistance at intervals from grand children He needs assistance with most care needs He wears depends   Past Medical History:  Diagnosis Date   Anemia    Arthritis    all over   Carotid stenosis    Chronic back pain    Chronic kidney disease    Constipation    Dementia ([place])    Diabetes mellitus    GERD (gastroesophageal reflux disease)    History of kidney stones    Hypertension    no per patient   Lung nodule    Peripheral neuropathy    Peripheral vascular disease ([place])    PTSD (post-traumatic stress disorder)    PTSD (post-traumatic stress disorder)    Schizophrenia ([place])    Tuberculosis    x 1 - treated -3 times     durable medical equipment (DME) Rolling walker, eye glasses, cbg monitor   Current Outpatient Medications on File Prior to Visit  Medication Sig Dispense Refill   acetaminophen (TYLENOL) 500 MG tablet Take 500  mg by mouth every 6 (six) hours as needed for moderate pain.     aspirin EC 81 MG tablet Take 1 tablet (81 mg total) by mouth 2 (two) times daily for 30 days, THEN 1 tablet (81 mg total) daily. 150 tablet 0   atorvastatin (LIPITOR) 40 MG tablet Take 1 tablet (40 mg total) by mouth at bedtime. 90 tablet 0   B-D UF III MINI PEN NEEDLES 31G X 5 MM MISC SMARTSIG:1 Each SUB-Q Daily     donepezil (ARICEPT) 10 MG tablet Take 10 mg by mouth at bedtime.      feeding supplement (ENSURE ENLIVE / ENSURE PLUS) LIQD Take 237 mLs by mouth 2 (two) times daily between meals. 14220 mL 0   gabapentin (NEURONTIN) 100 MG capsule Take 100 mg by mouth at bedtime.     insulin lispro (HUMALOG) 100 UNIT/ML injection Inject 5-10 Units into the skin daily as needed for high blood sugar.      LANTUS 100 UNIT/ML injection Inject 0.1 mLs (10 Units total) into the skin at bedtime as needed (High blood glucose). 10 mL 11   losartan (COZAAR) 50 MG tablet Take 50 mg by mouth at bedtime.     metoprolol tartrate (LOPRESSOR) 25 MG tablet Take 1 tablet (25 mg total) by mouth 2 (two) times daily. 180 tablet 3   Multiple Vitamins-Minerals (MULTIVITAMIN WITH MINERALS) tablet Take 1 tablet by mouth daily. 50 plus     nicotine (NICODERM CQ - DOSED IN MG/24  HOURS) 21 mg/24hr patch Place 1 patch (21 mg total) onto the skin daily as needed (Cessation of smoking). 28 patch 0   oxyCODONE-acetaminophen (PERCOCET) 5-325 MG tablet Take 1 tablet by mouth every 6 (six) hours as needed for severe pain. Do not take with Acetaminophen (Tylenol) 30 tablet 0   oxyCODONE-acetaminophen (PERCOCET/ROXICET) 5-325 MG tablet Take 1 tablet by mouth every 4 (four) hours as needed for severe pain. 30 tablet 0   No current facility-administered medications on file prior to visit.       Plan: Patient agrees to the care plan and follow up [facility] RN CM will follow up within the next 7-10 business days Pt encouraged to return a call to [place] CM prn Provided RN CM  number   [name], RN, BSN, [place] Coordinator Office number [phone] Mobile number [phone]  Main [facility] number [phone] Fax number [phone]

## 2020-07-06 ENCOUNTER — Encounter: Payer: Self-pay | Admitting: *Deleted

## 2020-07-06 ENCOUNTER — Other Ambulatory Visit: Payer: Self-pay | Admitting: *Deleted

## 2020-07-06 DIAGNOSIS — Z4781 Encounter for orthopedic aftercare following surgical amputation: Secondary | ICD-10-CM | POA: Diagnosis not present

## 2020-07-06 DIAGNOSIS — E1122 Type 2 diabetes mellitus with diabetic chronic kidney disease: Secondary | ICD-10-CM | POA: Diagnosis not present

## 2020-07-06 DIAGNOSIS — E1152 Type 2 diabetes mellitus with diabetic peripheral angiopathy with gangrene: Secondary | ICD-10-CM | POA: Diagnosis not present

## 2020-07-06 DIAGNOSIS — I129 Hypertensive chronic kidney disease with stage 1 through stage 4 chronic kidney disease, or unspecified chronic kidney disease: Secondary | ICD-10-CM | POA: Diagnosis not present

## 2020-07-06 DIAGNOSIS — N390 Urinary tract infection, site not specified: Secondary | ICD-10-CM | POA: Diagnosis not present

## 2020-07-06 DIAGNOSIS — A419 Sepsis, unspecified organism: Secondary | ICD-10-CM | POA: Diagnosis not present

## 2020-07-06 NOTE — Patient Outreach (Addendum)
[facility] [facility]) Care Management  07/06/2020  ANMOL FLECK 1945/03/21 [id number]   [facility] Care coordination-insulin medicine management  Mr KEEON ZURN was referred to [facility] on 07/01/20 by [facility] hospital liaison for post hospital/complex care services after his left AKA (above the knee amputation)' and for disease management of diabetes  Insurance: tricare for life, Medicare Last admission 6/7-06/30/20 diabetes left AKA (above the knee amputation)- [facility]  Spoke with [facility] at the [facility] [phone] to confirm the Lantus had to be ordered but is now ready at the pharmacy and the cost will be $38  Updated wife She is able to verify HIPAA ([place] and Accountability Act) identifiers Reviewed and addressed the purpose of the follow up call with her  Consent: [facility] RN CM reviewed [facility] services with her Mrs Mairena gave verbal consent for services.  Wife reports [facility] after initial start She will go pick up medications Report pt doing well today and has just gotten up to start getting ready for the home health [facility]) Physical therapy (PT) to arrive at noon  She states his appetite is fair and she has been offering him boost but then he has diarrhea RN CM suggest possible lactose intolerance and use of non milk base protein shakes like premier or ensure She voiced appreciation and understanding   Denies further care coordination needs during this outreach DME Bedside commode;Walker - 2 wheels;Cane - single point;Wheelchair - manual eyeglasses Cbg monitor home ramp- 2 level home -live lower level  Past Medical History:  Diagnosis Date   Anemia    Arthritis    all over   Carotid stenosis    Chronic back pain    Chronic kidney disease    Constipation    Dementia (HCC)    Diabetes mellitus    GERD  (gastroesophageal reflux disease)    History of kidney stones    Hypertension    no per patient   Lung nodule    Peripheral neuropathy    Peripheral vascular disease (HCC)    PTSD (post-traumatic stress disorder)    PTSD (post-traumatic stress disorder)    Schizophrenia ([place])    Tuberculosis    x 1 - treated -3 times    Plan EMMI education sent via e-mail on intro to an ACO, high calorie high protein diet, diabetes: nutrition and healthy eating, where to get help paying for your prescriptions,dementia caregiver wound care: chronic wounds Patient agrees to care plan and follow up within the next 14-21 business days for further disease management  Goals Addressed               This Visit's Progress     Patient Stated     [facility]) Eat Healthy (pt-stated)   On track     Timeframe:  Long-Range Goal Priority:  High Start Date:                     07/05/20        Expected End Date:           09/20/20            Follow Up Date 07/20/20  Barriers: Knowledge    - set a realistic goal - take in non milk based supplement     Notes:  07/06/20 diarrhea from milk base boost to try non milk  base product       [facility]) Manage My Medicine (pt-stated)   On track     Timeframe:  Short-Term Goal Priority:  High Start Date:                         07/05/20    Expected End Date:              07/21/20         Follow Up Date 07/19/20   Barriers: Knowledge  - call for medicine refill 2 or 3 days before it runs out - keep a list of all the medicines I take; vitamins and herbals too -pick up insulin when able from local pharmacy and use mail order prn     Notes: 07/06/20 wife will go pick up insulin from pharmacy  07/05/20 pending refill of insulin           Maanvi Lecompte L. [name], RN, BSN, [place] Coordinator Office number [phone] Mobile number [phone]  Main [facility] number [phone] Fax number [phone]

## 2020-07-08 DIAGNOSIS — E1152 Type 2 diabetes mellitus with diabetic peripheral angiopathy with gangrene: Secondary | ICD-10-CM | POA: Diagnosis not present

## 2020-07-08 DIAGNOSIS — N390 Urinary tract infection, site not specified: Secondary | ICD-10-CM | POA: Diagnosis not present

## 2020-07-08 DIAGNOSIS — A419 Sepsis, unspecified organism: Secondary | ICD-10-CM | POA: Diagnosis not present

## 2020-07-08 DIAGNOSIS — E1122 Type 2 diabetes mellitus with diabetic chronic kidney disease: Secondary | ICD-10-CM | POA: Diagnosis not present

## 2020-07-08 DIAGNOSIS — I129 Hypertensive chronic kidney disease with stage 1 through stage 4 chronic kidney disease, or unspecified chronic kidney disease: Secondary | ICD-10-CM | POA: Diagnosis not present

## 2020-07-08 DIAGNOSIS — Z4781 Encounter for orthopedic aftercare following surgical amputation: Secondary | ICD-10-CM | POA: Diagnosis not present

## 2020-07-14 DIAGNOSIS — J8410 Pulmonary fibrosis, unspecified: Secondary | ICD-10-CM | POA: Diagnosis not present

## 2020-07-14 DIAGNOSIS — N390 Urinary tract infection, site not specified: Secondary | ICD-10-CM | POA: Diagnosis not present

## 2020-07-14 DIAGNOSIS — A419 Sepsis, unspecified organism: Secondary | ICD-10-CM | POA: Diagnosis not present

## 2020-07-14 DIAGNOSIS — Z4781 Encounter for orthopedic aftercare following surgical amputation: Secondary | ICD-10-CM | POA: Diagnosis not present

## 2020-07-14 DIAGNOSIS — E1152 Type 2 diabetes mellitus with diabetic peripheral angiopathy with gangrene: Secondary | ICD-10-CM | POA: Diagnosis not present

## 2020-07-14 DIAGNOSIS — E1122 Type 2 diabetes mellitus with diabetic chronic kidney disease: Secondary | ICD-10-CM | POA: Diagnosis not present

## 2020-07-14 DIAGNOSIS — G309 Alzheimer's disease, unspecified: Secondary | ICD-10-CM | POA: Diagnosis not present

## 2020-07-14 DIAGNOSIS — I129 Hypertensive chronic kidney disease with stage 1 through stage 4 chronic kidney disease, or unspecified chronic kidney disease: Secondary | ICD-10-CM | POA: Diagnosis not present

## 2020-07-18 ENCOUNTER — Telehealth: Payer: Self-pay

## 2020-07-18 NOTE — Telephone Encounter (Signed)
Patient's wife calls today to report that patient is having cloudy urine - was prescribed an antibiotic prior to amputation surgery. Advised her she would need to follow up with patient's PCP [name] for further management. She knows to call [facility] back if patient develops any issues with amp site.

## 2020-07-19 ENCOUNTER — Other Ambulatory Visit: Payer: Self-pay | Admitting: *Deleted

## 2020-07-19 NOTE — Patient Outreach (Signed)
[place] [facility]) Care Management  07/19/2020  MEHMET SCALLY 1945-04-26 [id number]   [facility] Unsuccessful outreach  Mr EMRYS MCKAMIE was referred to [facility] on 07/01/20 by [facility] hospital liaison for post hospital/complex care services after his left AKA (above the knee amputation)' and for disease management of diabetes   Insurance: tricare for life, Medicare Last admission 6/7-06/30/20 diabetes left AKA (above the knee amputation)- discharged with [facility]  Last successful outreach on 07/06/20  Outreach attempt to the home number  No answer. [facility] RN CM left HIPAA [facility] Portability and Accountability Act) compliant voicemail message along with CM's contact info.   Plan: [facility] RN CM scheduled this patient for another call attempt within 4-7 business days  Raychell Holcomb L. [name], RN, BSN, [place] Coordinator Office number [phone] Mobile number [phone]  Main [facility] number [phone] Fax number [phone]

## 2020-07-20 ENCOUNTER — Other Ambulatory Visit: Payer: Self-pay | Admitting: *Deleted

## 2020-07-20 DIAGNOSIS — R350 Frequency of micturition: Secondary | ICD-10-CM | POA: Diagnosis not present

## 2020-07-20 NOTE — Patient Outreach (Signed)
[place] [facility] [place] [facility]) Care Management  07/20/2020  Allen Yu Jun 07, 1945 [id number]  [facility] incoming outreach from family of complex care patient Allen Allen Yu was referred to [facility] on 07/01/20 by [facility] hospital liaison for post hospital/complex care services after his left AKA (above the knee amputation)' and for disease management of diabetes   Insurance: tricare for life, Medicare Last admission 6/7-06/30/20 diabetes left AKA (above the knee amputation)- discharged with services from [facility]   Return call from wife, Allen Yu She is able to verify HIPAA ([place] and [place]) identifiers Reviewed and addressed the purpose of the follow up message left for her- [place] resources for home care needs  Consent: [facility] RN CM reviewed [facility] services with patient. Patient gave verbal consent for services.   Assessment- Mrs Allen Yu reports Allen Yu is voicing frustration with his health status  With this she mentions she needs to remind herself to remove the patient's guns from their home She confirms she has minimal assistance and reports there are some family members that may visit pt and take him out but for only 1-2 hours She confirms Allen Yu is connected to the [place] administration ([place]) cline and an upcoming appointment is scheduled for August 03 2020 at 10 am  She reports she is attempting and need assist in finding resources for Allen Yu for eye and hearing exams  She has placed a few calls for resources and has begun to complete some forms  She discussed Allen Yu preference of care providers She called and got some forms to get completed for his care   [facility] RN CM will follow up with pt/wife within the next 7-14 business days Pt encouraged to return a call to [facility] RN CM prn   Allen Millin L. [name], RN, BSN, [place]  Coordinator Office number [phone] Main [facility] number [phone] Fax number [phone]

## 2020-07-21 ENCOUNTER — Inpatient Hospital Stay (HOSPITAL_COMMUNITY)
Admission: EM | Admit: 2020-07-21 | Discharge: 2020-07-26 | DRG: 871 | Disposition: A | Discharge status: Home Health | Payer: Medicare Other | Attending: Internal Medicine | Admitting: Internal Medicine

## 2020-07-21 ENCOUNTER — Emergency Department (HOSPITAL_COMMUNITY): Payer: Medicare Other

## 2020-07-21 ENCOUNTER — Inpatient Hospital Stay (HOSPITAL_COMMUNITY): Payer: Medicare Other

## 2020-07-21 ENCOUNTER — Other Ambulatory Visit: Payer: Self-pay

## 2020-07-21 ENCOUNTER — Encounter (HOSPITAL_COMMUNITY): Payer: Self-pay

## 2020-07-21 DIAGNOSIS — F0390 Unspecified dementia without behavioral disturbance: Secondary | ICD-10-CM | POA: Diagnosis present

## 2020-07-21 DIAGNOSIS — Z79899 Other long term (current) drug therapy: Secondary | ICD-10-CM

## 2020-07-21 DIAGNOSIS — E869 Volume depletion, unspecified: Secondary | ICD-10-CM | POA: Diagnosis present

## 2020-07-21 DIAGNOSIS — Z515 Encounter for palliative care: Secondary | ICD-10-CM | POA: Diagnosis not present

## 2020-07-21 DIAGNOSIS — Z7189 Other specified counseling: Secondary | ICD-10-CM | POA: Diagnosis not present

## 2020-07-21 DIAGNOSIS — F17210 Nicotine dependence, cigarettes, uncomplicated: Secondary | ICD-10-CM | POA: Diagnosis present

## 2020-07-21 DIAGNOSIS — Z8249 Family history of ischemic heart disease and other diseases of the circulatory system: Secondary | ICD-10-CM | POA: Diagnosis not present

## 2020-07-21 DIAGNOSIS — E1122 Type 2 diabetes mellitus with diabetic chronic kidney disease: Secondary | ICD-10-CM | POA: Diagnosis present

## 2020-07-21 DIAGNOSIS — E871 Hypo-osmolality and hyponatremia: Secondary | ICD-10-CM | POA: Diagnosis present

## 2020-07-21 DIAGNOSIS — E1165 Type 2 diabetes mellitus with hyperglycemia: Secondary | ICD-10-CM | POA: Diagnosis present

## 2020-07-21 DIAGNOSIS — N136 Pyonephrosis: Secondary | ICD-10-CM | POA: Diagnosis present

## 2020-07-21 DIAGNOSIS — G9341 Metabolic encephalopathy: Secondary | ICD-10-CM | POA: Diagnosis present

## 2020-07-21 DIAGNOSIS — E1151 Type 2 diabetes mellitus with diabetic peripheral angiopathy without gangrene: Secondary | ICD-10-CM | POA: Diagnosis present

## 2020-07-21 DIAGNOSIS — E785 Hyperlipidemia, unspecified: Secondary | ICD-10-CM | POA: Diagnosis present

## 2020-07-21 DIAGNOSIS — K219 Gastro-esophageal reflux disease without esophagitis: Secondary | ICD-10-CM | POA: Diagnosis present

## 2020-07-21 DIAGNOSIS — Z89612 Acquired absence of left leg above knee: Secondary | ICD-10-CM | POA: Diagnosis not present

## 2020-07-21 DIAGNOSIS — D62 Acute posthemorrhagic anemia: Secondary | ICD-10-CM | POA: Diagnosis present

## 2020-07-21 DIAGNOSIS — R410 Disorientation, unspecified: Secondary | ICD-10-CM | POA: Diagnosis not present

## 2020-07-21 DIAGNOSIS — E876 Hypokalemia: Secondary | ICD-10-CM | POA: Diagnosis not present

## 2020-07-21 DIAGNOSIS — I480 Paroxysmal atrial fibrillation: Secondary | ICD-10-CM | POA: Diagnosis not present

## 2020-07-21 DIAGNOSIS — M549 Dorsalgia, unspecified: Secondary | ICD-10-CM | POA: Diagnosis present

## 2020-07-21 DIAGNOSIS — M159 Polyosteoarthritis, unspecified: Secondary | ICD-10-CM | POA: Diagnosis present

## 2020-07-21 DIAGNOSIS — A4159 Other Gram-negative sepsis: Principal | ICD-10-CM | POA: Diagnosis present

## 2020-07-21 DIAGNOSIS — R4182 Altered mental status, unspecified: Secondary | ICD-10-CM

## 2020-07-21 DIAGNOSIS — R6510 Systemic inflammatory response syndrome (SIRS) of non-infectious origin without acute organ dysfunction: Secondary | ICD-10-CM | POA: Diagnosis present

## 2020-07-21 DIAGNOSIS — F4310 Post-traumatic stress disorder, unspecified: Secondary | ICD-10-CM | POA: Diagnosis present

## 2020-07-21 DIAGNOSIS — I739 Peripheral vascular disease, unspecified: Secondary | ICD-10-CM | POA: Diagnosis not present

## 2020-07-21 DIAGNOSIS — Z7982 Long term (current) use of aspirin: Secondary | ICD-10-CM

## 2020-07-21 DIAGNOSIS — R531 Weakness: Secondary | ICD-10-CM | POA: Diagnosis not present

## 2020-07-21 DIAGNOSIS — N179 Acute kidney failure, unspecified: Secondary | ICD-10-CM | POA: Diagnosis present

## 2020-07-21 DIAGNOSIS — Z794 Long term (current) use of insulin: Secondary | ICD-10-CM

## 2020-07-21 DIAGNOSIS — I4891 Unspecified atrial fibrillation: Secondary | ICD-10-CM

## 2020-07-21 DIAGNOSIS — I129 Hypertensive chronic kidney disease with stage 1 through stage 4 chronic kidney disease, or unspecified chronic kidney disease: Secondary | ICD-10-CM | POA: Diagnosis present

## 2020-07-21 DIAGNOSIS — G8929 Other chronic pain: Secondary | ICD-10-CM | POA: Diagnosis present

## 2020-07-21 DIAGNOSIS — R911 Solitary pulmonary nodule: Secondary | ICD-10-CM | POA: Diagnosis not present

## 2020-07-21 DIAGNOSIS — J439 Emphysema, unspecified: Secondary | ICD-10-CM | POA: Diagnosis not present

## 2020-07-21 DIAGNOSIS — N3080 Other cystitis without hematuria: Secondary | ICD-10-CM | POA: Diagnosis not present

## 2020-07-21 DIAGNOSIS — N1831 Chronic kidney disease, stage 3a: Secondary | ICD-10-CM | POA: Diagnosis present

## 2020-07-21 DIAGNOSIS — N3289 Other specified disorders of bladder: Secondary | ICD-10-CM | POA: Diagnosis not present

## 2020-07-21 DIAGNOSIS — I700 Atherosclerosis of aorta: Secondary | ICD-10-CM | POA: Diagnosis not present

## 2020-07-21 DIAGNOSIS — N1330 Unspecified hydronephrosis: Secondary | ICD-10-CM | POA: Diagnosis not present

## 2020-07-21 DIAGNOSIS — R000 Tachycardia, unspecified: Secondary | ICD-10-CM | POA: Diagnosis not present

## 2020-07-21 HISTORY — DX: Paroxysmal atrial fibrillation: I48.0

## 2020-07-21 LAB — FOLATE: Folate: 7.5 ng/mL (ref >5.9)

## 2020-07-21 LAB — COMPREHENSIVE METABOLIC PANEL
ALT: 16 U/L (ref 0–44)
AST: 17 U/L (ref 15–41)
Albumin: 3.4 g/dL — ABNORMAL LOW (ref 3.5–5.0)
Alkaline Phosphatase: 117 U/L (ref 38–126)
Anion gap: 10 (ref 5–15)
BUN: 82 mg/dL — ABNORMAL HIGH (ref 8–23)
CO2: 25 mmol/L (ref 22–32)
Calcium: 9.4 mg/dL (ref 8.9–10.3)
Chloride: 94 mmol/L — ABNORMAL LOW (ref 98–111)
Creatinine, Ser: 2.37 mg/dL — ABNORMAL HIGH (ref 0.61–1.24)
GFR, Estimated: 28 mL/min — ABNORMAL LOW (ref >60)
Glucose, Bld: 350 mg/dL — ABNORMAL HIGH (ref 70–99)
Potassium: 4.3 mmol/L (ref 3.5–5.1)
Sodium: 129 mmol/L — ABNORMAL LOW (ref 135–145)
Total Bilirubin: 1.4 mg/dL — ABNORMAL HIGH (ref 0.3–1.2)
Total Protein: 6.9 g/dL (ref 6.5–8.1)

## 2020-07-21 LAB — CBC WITH DIFFERENTIAL/PLATELET
Abs Immature Granulocytes: 0.2 10*3/uL — ABNORMAL HIGH (ref 0.00–0.07)
Basophils Absolute: 0.1 10*3/uL (ref 0.0–0.1)
Basophils Relative: 0 %
Eosinophils Absolute: 0 10*3/uL (ref 0.0–0.5)
Eosinophils Relative: 0 %
HCT: 29.8 % — ABNORMAL LOW (ref 39.0–52.0)
Hemoglobin: 9.8 g/dL — ABNORMAL LOW (ref 13.0–17.0)
Immature Granulocytes: 1 %
Lymphocytes Relative: 1 %
Lymphs Abs: 0.3 10*3/uL — ABNORMAL LOW (ref 0.7–4.0)
MCH: 31.3 pg (ref 26.0–34.0)
MCHC: 32.9 g/dL (ref 30.0–36.0)
MCV: 95.2 fL (ref 80.0–100.0)
Monocytes Absolute: 1.9 10*3/uL — ABNORMAL HIGH (ref 0.1–1.0)
Monocytes Relative: 9 %
Neutro Abs: 19.8 10*3/uL — ABNORMAL HIGH (ref 1.7–7.7)
Neutrophils Relative %: 89 %
Platelets: 279 10*3/uL (ref 150–400)
RBC: 3.13 MIL/uL — ABNORMAL LOW (ref 4.22–5.81)
RDW: 16.4 % — ABNORMAL HIGH (ref 11.5–15.5)
WBC: 22.3 10*3/uL — ABNORMAL HIGH (ref 4.0–10.5)
nRBC: 0.1 % (ref 0.0–0.2)

## 2020-07-21 LAB — LACTIC ACID, PLASMA: Lactic Acid, Venous: 1.6 mmol/L (ref 0.5–1.9)

## 2020-07-21 LAB — TSH
TSH: 0.351 u[IU]/mL (ref 0.350–4.500)
TSH: 0.359 u[IU]/mL (ref 0.350–4.500)

## 2020-07-21 LAB — VITAMIN B12: Vitamin B-12: 512 pg/mL (ref 180–914)

## 2020-07-21 LAB — PROCALCITONIN: Procalcitonin: 3.72 ng/mL

## 2020-07-21 LAB — ETHANOL: Alcohol, Ethyl (B): <10 mg/dL (ref <10)

## 2020-07-21 LAB — LIPASE, BLOOD: Lipase: 19 U/L (ref 11–51)

## 2020-07-21 LAB — GLUCOSE, CAPILLARY: Glucose-Capillary: 298 mg/dL — ABNORMAL HIGH (ref 70–99)

## 2020-07-21 MED ORDER — AMIODARONE HCL IN DEXTROSE 360-4.14 MG/200ML-% IV SOLN
30.0000 mg/h | INTRAVENOUS | Status: DC
  Filled 2020-07-21 (×2): qty 200

## 2020-07-21 MED ORDER — ACETAMINOPHEN 650 MG RE SUPP: 650.0000 mg | Freq: Four times a day (QID) | RECTAL | Status: DC | PRN

## 2020-07-21 MED ORDER — ONDANSETRON HCL 4 MG/2ML IJ SOLN: 4.0000 mg | Freq: Four times a day (QID) | INTRAMUSCULAR | Status: DC | PRN

## 2020-07-21 MED ORDER — CEFEPIME HCL 2 G IJ SOLR: 2.0000 g | INTRAMUSCULAR | Status: DC

## 2020-07-21 MED ORDER — IOHEXOL 9 MG/ML PO SOLN
ORAL | Status: AC
  Filled 2020-07-21: qty 1000

## 2020-07-21 MED ORDER — GABAPENTIN 100 MG PO CAPS
100.0000 mg | ORAL_CAPSULE | Freq: Every day | ORAL | Status: DC
  Filled 2020-07-21: qty 1

## 2020-07-21 MED ORDER — ACETAMINOPHEN 325 MG PO TABS
650.0000 mg | ORAL_TABLET | Freq: Four times a day (QID) | ORAL | Status: DC | PRN
  Administered 2020-07-25: 650 mg via ORAL
  Filled 2020-07-21: qty 2

## 2020-07-21 MED ORDER — TRAMADOL HCL 50 MG PO TABS
50.0000 mg | ORAL_TABLET | Freq: Four times a day (QID) | ORAL | Status: DC | PRN
Start: 2020-07-21 — End: 2020-07-26
  Administered 2020-07-23 – 2020-07-25 (×6): 50 mg via ORAL
  Filled 2020-07-21 (×7): qty 1

## 2020-07-21 MED ORDER — LACTATED RINGERS IV SOLN: INTRAVENOUS | Status: DC

## 2020-07-21 MED ORDER — HEPARIN SODIUM (PORCINE) 5000 UNIT/ML IJ SOLN
5000.0000 [IU] | Freq: Three times a day (TID) | INTRAMUSCULAR | Status: DC
  Filled 2020-07-21: qty 1

## 2020-07-21 MED ORDER — DONEPEZIL HCL 5 MG PO TABS
10.0000 mg | ORAL_TABLET | Freq: Every day | ORAL | Status: DC
  Administered 2020-07-22 – 2020-07-24 (×3): 10 mg via ORAL
  Filled 2020-07-21 (×5): qty 2

## 2020-07-21 MED ORDER — DILTIAZEM HCL 25 MG/5ML IV SOLN
5.0000 mg | Freq: Once | INTRAVENOUS | Status: AC
  Administered 2020-07-21: 5 mg via INTRAVENOUS
  Filled 2020-07-21: qty 5

## 2020-07-21 MED ORDER — ATORVASTATIN CALCIUM 40 MG PO TABS
40.0000 mg | ORAL_TABLET | Freq: Every day | ORAL | Status: DC
  Administered 2020-07-22 – 2020-07-24 (×3): 40 mg via ORAL
  Filled 2020-07-21 (×5): qty 1

## 2020-07-21 MED ORDER — SODIUM CHLORIDE 0.9 % IV BOLUS
500.0000 mL | Freq: Once | INTRAVENOUS | Status: AC
  Administered 2020-07-21: 500 mL via INTRAVENOUS

## 2020-07-21 MED ORDER — AMIODARONE HCL IN DEXTROSE 360-4.14 MG/200ML-% IV SOLN
60.0000 mg/h | INTRAVENOUS | Status: AC
  Administered 2020-07-22: 60 mg/h via INTRAVENOUS
  Filled 2020-07-21 (×2): qty 200

## 2020-07-21 MED ORDER — ADULT MULTIVITAMIN W/MINERALS CH
1.0000 | ORAL_TABLET | Freq: Every day | ORAL | Status: DC
  Administered 2020-07-23 – 2020-07-26 (×4): 1 via ORAL
  Filled 2020-07-21 (×6): qty 1

## 2020-07-21 MED ORDER — VANCOMYCIN HCL 1250 MG/250ML IV SOLN
1250.0000 mg | Freq: Once | INTRAVENOUS | Status: AC
  Administered 2020-07-21: 1250 mg via INTRAVENOUS
  Filled 2020-07-21: qty 250

## 2020-07-21 MED ORDER — SODIUM CHLORIDE 0.9 % IV SOLN
2.0000 g | INTRAVENOUS | Status: DC
  Administered 2020-07-21: 2 g via INTRAVENOUS
  Filled 2020-07-21: qty 2

## 2020-07-21 MED ORDER — ASPIRIN EC 81 MG PO TBEC
81.0000 mg | DELAYED_RELEASE_TABLET | Freq: Every day | ORAL | Status: DC
  Filled 2020-07-21 (×2): qty 1

## 2020-07-21 MED ORDER — VANCOMYCIN HCL 750 MG/150ML IV SOLN: 750.0000 mg | INTRAVENOUS | Status: DC

## 2020-07-21 MED ORDER — METOPROLOL TARTRATE 25 MG PO TABS
25.0000 mg | ORAL_TABLET | Freq: Two times a day (BID) | ORAL | Status: DC
  Filled 2020-07-21 (×2): qty 1

## 2020-07-21 MED ORDER — ONDANSETRON HCL 4 MG PO TABS: 4.0000 mg | ORAL_TABLET | Freq: Four times a day (QID) | ORAL | Status: DC | PRN

## 2020-07-21 NOTE — ED Provider Notes (Signed)
Emergency Department Provider Note   I have reviewed the triage vital signs and the nursing notes.   HISTORY  Chief Complaint Altered Mental Status   HPI Allen Yu is a 75 y.o. male with past medical history reviewed below including recent AKI on the left and schizophrenia presents to the emergency department with increased agitation and refusing medications over the past 2 to 3 days.  Patient arrives to the ED with his wife who provides most of the history.  She tells me that he has had the recent amputation of the left leg which seemed to hit him very hard.  He had a heel ulcer which would not improve with intervention and ultimately required amputation.  She states that the incision is looking good to her and the staples are due to come out soon.  He is followed by vascular surgery at [facility] for this.  He has not been wanting to take his medications over the past 3 days including his insulin.  She tells me that he has become increasingly agitated.  She tells me that he is typically fairly grumpy but that has been worse than normal.  She has not noticed any fevers.  He continues to urinate although does seem darker than normal. He is not eating or drinking much as all.  EMS did arrive this morning but patient would not allow them to perform vital signs or do anything in terms of starting an IV or transport.  The wife ultimately decided to put him in the car and drive him here herself.   Patient is looking around the room and intermittently agitated but not engaging with much history. Level 5 caveat applies.    Past Medical History:  Diagnosis Date   Anemia    Arthritis    all over   Carotid stenosis    Chronic back pain    Chronic kidney disease    Constipation    Dementia (HCC)    Diabetes mellitus    GERD (gastroesophageal reflux disease)    History of kidney stones    Hypertension    no per patient   Lung nodule    Peripheral neuropathy    Peripheral vascular disease  (HCC)    PTSD (post-traumatic stress disorder)    PTSD (post-traumatic stress disorder)    Schizophrenia ([place])    Tuberculosis    x 1 - treated -3 times    Patient Active Problem List   Diagnosis Date Noted   SIRS (systemic inflammatory response syndrome) ([place]) 88/50/2774   Acute metabolic encephalopathy 12/87/8676   Hyponatremia    S/P AKA (above knee amputation) unilateral, left ([place]) 06/28/2020   Gangrene of left foot ([place]) 06/28/2020   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 ([place]) 05/27/2020   Critical lower limb ischemia ([place]) 05/21/2020   Protein-calorie malnutrition, severe 05/19/2020   Plantar callus 12/24/2019   Cavitary pneumonia 09/14/2019   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   Unintentional weight loss 09/14/2019   Coronary artery calcification seen on CT scan 10/28/2018   Hilar adenopathy 04/10/2016   Lung nodule seen on imaging study 07/09/2015   Acute encephalopathy    Hypoglycemia due to insulin    Hypertension 07/08/2015   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[place]) 07/08/2015   Acute kidney injury superimposed on chronic kidney disease ([place]) 07/08/2015   Normocytic anemia 07/08/2015   Tobacco use disorder 07/08/2015   Diverticulosis of colon without hemorrhage    Left inguinal hernia  09/16/2013   Altered mental status 11/17/2012   Hypothermia 11/17/2012   Leukocytosis 11/17/2012   CKD (chronic kidney disease), stage II    PTSD (post-traumatic stress disorder)    GERD (gastroesophageal reflux disease)     Past Surgical History:  Procedure Laterality Date   ABDOMINAL AORTOGRAM W/LOWER EXTREMITY N/A 05/20/2020   Procedure: ABDOMINAL AORTOGRAM W/LOWER EXTREMITY;  Surgeon: Elam Dutch, MD;  Location: [facility];  Service: Cardiovascular;  Laterality: N/A;   AMPUTATION Left 06/28/2020   Procedure: AMPUTATION ABOVE KNEE LEFT;  Surgeon: Rosetta Posner, MD;  Location: [facility] OR;  Service: Vascular;  Laterality: Left;    [place], 2013   x2   BLADDER SURGERY     02   CHOLECYSTECTOMY     COLONOSCOPY     COLONOSCOPY N/A 12/07/2014   Procedure: COLONOSCOPY;  Surgeon: Daneil Dolin, MD;  [facility];  Service: Endoscopy;  Laterality: N/A;  10:30 Am   EYE SURGERY Bilateral    removed metal from eye   FEMORAL-TIBIAL BYPASS GRAFT Left 05/27/2020   Procedure: LEFT FEMORAL TO PERONEAL ARTERY BYPASS;  Surgeon: Rosetta Posner, MD;  Location: [facility];  Service: Vascular;  Laterality: Left;   HERNIA REPAIR Right    INGUINAL HERNIA REPAIR Left 11/03/2013   Procedure: HERNIA REPAIR INGUINAL ADULT;  Surgeon: Gayland Curry, MD;  Location: [facility];  Service: General;  Laterality: Left;   SHOULDER SURGERY     RIGHT SHOULDER    VIDEO BRONCHOSCOPY WITH ENDOBRONCHIAL NAVIGATION N/A 07/10/2019   Procedure: VIDEO BRONCHOSCOPY WITH ENDOBRONCHIAL NAVIGATION;  Surgeon: Melrose Nakayama, MD;  Location: [facility] OR;  Service: Thoracic;  Laterality: N/A;    Allergies Bee venom, Codeine, Diovan [valsartan], and Propoxyphene  Family History  Problem Relation Age of Onset   Heart disease Mother        before age 65    Social History Social History   Tobacco Use   Smoking status: Every Day    Packs/day: 0.50    Pack years: 0.00    Types: Cigarettes   Smokeless tobacco: Never   Tobacco comments:    burns them up  Vaping Use   Vaping Use: Never used  Substance Use Topics   Alcohol use: No    Alcohol/week: 0.0 standard drinks   Drug use: Yes    Types: Marijuana    Comment: almost daily for pain    Review of Systems  Level 5 caveat: Confusion/agitation   ____________________________________________   PHYSICAL EXAM:  VITAL SIGNS: ED Triage Vitals  Enc Vitals Group     BP 07/21/20 0948 134/72     Pulse Rate 07/21/20 0948 (!) 117     Resp 07/21/20 0948 20     Temp 07/21/20 0948 98.6 F (37 C)     Temp Source 07/21/20 0948 Oral     SpO2 07/21/20 0948 100 %     Weight  07/21/20 1000 127 lb (57.6 kg)     Height 07/21/20 1000 6\' 1"  (1.854 m)   Constitutional: Alert and HOH.  Intermittently becomes agitated especially when trying to perform parts of the physical exam but mostly cooperative. Unable to provide a significant history.  Eyes: Conjunctivae are normal. PERRL.  Head: Atraumatic. Nose: No congestion/rhinnorhea. Mouth/Throat: Mucous membranes are dry.  Neck: No stridor.   Cardiovascular: Tachycardia. Good peripheral circulation. Grossly normal heart sounds.   Respiratory: Normal respiratory effort.  No retractions. Lungs  CTAB. Gastrointestinal: Soft and nontender. No distention.  Musculoskeletal: AKA on the left noted.  The incision is clean and dry.  Staples in place.  Neurologic:  Normal speech and language. No gross focal neurologic deficits are appreciated. Moving extremities equally.  Skin:  Skin is warm, dry and intact. No rash noted.  ____________________________________________   LABS (all labs ordered are listed, but only abnormal results are displayed)  Labs Reviewed  COMPREHENSIVE METABOLIC PANEL - Abnormal; Notable for the following components:      Result Value   Sodium 129 (*)    Chloride 94 (*)    Glucose, Bld 350 (*)    BUN 82 (*)    Creatinine, Ser 2.37 (*)    Albumin 3.4 (*)    Total Bilirubin 1.4 (*)    GFR, Estimated 28 (*)    All other components within normal limits  CBC WITH DIFFERENTIAL/PLATELET - Abnormal; Notable for the following components:   WBC 22.3 (*)    RBC 3.13 (*)    Hemoglobin 9.8 (*)    HCT 29.8 (*)    RDW 16.4 (*)    Neutro Abs 19.8 (*)    Lymphs Abs 0.3 (*)    Monocytes Absolute 1.9 (*)    Abs Immature Granulocytes 0.20 (*)    All other components within normal limits  CULTURE, BLOOD (ROUTINE X 2)  CULTURE, BLOOD (ROUTINE X 2)  URINE CULTURE  URINE CULTURE  ETHANOL  LACTIC ACID, PLASMA  URINALYSIS, ROUTINE W REFLEX MICROSCOPIC  RAPID URINE DRUG SCREEN, HOSP PERFORMED  URINALYSIS,  COMPLETE (UACMP) WITH MICROSCOPIC   ____________________________________________  EKG   EKG Interpretation  Date/Time:  Thursday July 21 2020 10:47:48 EDT Ventricular Rate:  110 PR Interval:  120 QRS Duration: 81 QT Interval:  315 QTC Calculation: 427 R Axis:   64 Text Interpretation: Sinus tachycardia Atrial premature complexes Low voltage, extremity leads Baseline wander in lead(s) V1 Confirmed by Nanda Quinton [phone]) on 07/21/2020 12:05:49 PM         ____________________________________________  RADIOLOGY  DG Chest 1 View  Result Date: 07/21/2020 CLINICAL DATA:  75 year old male with confusion and weakness. Recent UTI. EXAM: CHEST  1 VIEW COMPARISON:  Chest CTA 06/29/2020 and earlier. FINDINGS: Portable AP upright view at 1019 hours. Stable lung volumes and mediastinal contours. Emphysema demonstrated on the prior CTA. The mildly spiculated right upper lobe pulmonary nodule seen by CT is poorly visible with plain radiograph technique. Allowing for portable technique the lungs are clear. Visualized tracheal air column is within normal limits. Prior cervical ACDF. No acute osseous abnormality identified. Negative visible bowel gas pattern. IMPRESSION: 1. The suspicious right upper lobe pulmonary nodule seen by CT 06/29/2020 is occult by portable x-ray. 2. Emphysema.  No new cardiopulmonary abnormality. Electronically Signed   By: Genevie Ann M.D.   On: 07/21/2020 11:01   CT Head Wo Contrast  Result Date: 07/21/2020 CLINICAL DATA:  75 year old male with confusion, agitation, altered mental status. EXAM: CT HEAD WITHOUT CONTRAST TECHNIQUE: Contiguous axial images were obtained from the base of the skull through the vertex without intravenous contrast. COMPARISON:  Brain MRI 08/27/2018.  Head CT 07/08/2015. FINDINGS: Brain: Mild cerebral volume loss since 2017 appears to be generalized. No midline shift, ventriculomegaly, mass effect, evidence of mass lesion, intracranial hemorrhage or  evidence of cortically based acute infarction. Patchy and confluent bilateral cerebral white matter hypodensity has progressed since 2017 but appears stable to the 2020 MRI. Bilateral basal ganglia vascular calcifications. No cortical encephalomalacia.  Vascular: Calcified atherosclerosis at the skull base. No suspicious intracranial vascular hyperdensity. Skull: Mild chronic right lamina papyracea fracture. Otherwise negative. No acute osseous abnormality identified. Sinuses/Orbits: Visualized paranasal sinuses and mastoids are stable and well aerated. Other: Visualized orbits and scalp soft tissues are within normal limits. IMPRESSION: 1. No acute intracranial abnormality. 2. Chronic small vessel disease appears stable since a 2020 MRI. Electronically Signed   By: Genevie Ann M.D.   On: 07/21/2020 11:05    ____________________________________________   PROCEDURES  Procedure(s) performed:   Procedures  CRITICAL CARE Performed by: Margette Fast Total critical care time: 35 minutes Critical care time was exclusive of separately billable procedures and treating other patients. Critical care was necessary to treat or prevent imminent or life-threatening deterioration. Critical care was time spent personally by me on the following activities: development of treatment plan with patient and/or surrogate as well as nursing, discussions with consultants, evaluation of patient's response to treatment, examination of patient, obtaining history from patient or surrogate, ordering and performing treatments and interventions, ordering and review of laboratory studies, ordering and review of radiographic studies, pulse oximetry and re-evaluation of patient's condition.  Nanda Quinton, MD Emergency Medicine  ____________________________________________   INITIAL IMPRESSION / ASSESSMENT AND PLAN / ED COURSE  Pertinent labs & imaging results that were available during my care of the patient were reviewed by me and  considered in my medical decision making (see chart for details).   Patient arrives to the emergency department with altered mental status in terms of being more confused but also intermittently agitated.  Wife describes worsening depression and she feels like he is "giving up" since his health has taken a major downward turn.  He has not been eating or drinking much and is also been off of his medications.  He does have a history of schizophrenia as well as diabetes.  His lab work here is hemoconcentrated with acute kidney injury, hyponatremia.  He does have an elevated leukocytosis but no clear signs/source of infection.  Heart rate is improved with IV fluids.  UA is pending.  His CT scans of the head along with chest x-ray reviewed with no acute findings.  Plan for continued IV fluid hydration and will monitor for fever or other signs of infection.  We will send for blood cultures and urine culture when they come back but will hold on aggressive antibiotics as patient is not meeting SIRS criteria or having an elevated lactate.   Labs showing mild hyponatremia and AKI. Seems volume depleted clinically. Doubt infection but have added blood cultures.  Discussed patient's case with [facility] to request admission. Patient and family (if present) updated with plan. Care transferred to [facility] service.  I reviewed all nursing notes, vitals, pertinent old records, EKGs, labs, imaging (as available).  ____________________________________________  FINAL CLINICAL IMPRESSION(S) / ED DIAGNOSES  Final diagnoses:  AKI (acute kidney injury) ([place])  Hyponatremia     MEDICATIONS GIVEN DURING THIS VISIT:  Medications  lactated ringers infusion ( Intravenous New Bag/Given 07/21/20 1225)  vancomycin (VANCOREADY) IVPB 1250 mg/250 mL (1,250 mg Intravenous New Bag/Given 07/21/20 1411)  ceFEPIme (MAXIPIME) 2 g in sodium chloride 0.9 % 100 mL IVPB (has no administration in time range)  vancomycin (VANCOREADY) IVPB 750 mg/150  mL (has no administration in time range)  sodium chloride 0.9 % bolus 500 mL (0 mLs Intravenous Stopped 07/21/20 1220)    Note:  This document was prepared using Dragon voice recognition software and may include unintentional dictation  errors.  Nanda Quinton, MD, [facility] Emergency Medicine    Najmo Pardue, [facility], MD 07/21/20 1539

## 2020-07-21 NOTE — Progress Notes (Signed)
Pharmacy Antibiotic Note  Allen Yu is a 75 y.o. male admitted on 07/21/2020 with sepsis.  Pharmacy has been consulted for Vancomycin and cefepime dosing.  Plan: Vancomycin 1250 mg IV x 1 dose. Vancomycin 750 mg IV every 48 hours. Cefepime 2000 mg IV every 24 hours. Monitor labs, c/s, and vanco level as indicated.  Height: 6\' 1"  (185.4 cm) Weight: 57.6 kg (127 lb) IBW/kg (Calculated) : 79.9  Temp (24hrs), Avg:98.3 F (36.8 C), Min:98 F (36.7 C), Max:98.6 F (37 C)  Recent Labs  Lab 07/21/20 1055 07/21/20 1056  WBC 22.3*  --   CREATININE 2.37*  --   LATICACIDVEN  --  1.6    Estimated Creatinine Clearance: 22.3 mL/min (A) (by C-G formula based on SCr of 2.37 mg/dL (H)).    Allergies  Allergen Reactions   Bee Venom Anaphylaxis   Codeine Other (See Comments)    incoherent    Diovan [Valsartan] Other (See Comments)    incoherent   Propoxyphene Other (See Comments)    Dizziness, "Makes me feel drunk"    Antimicrobials this admission: Vanco 6/30 >>  Cefepime 6/30 >>    Microbiology results: 6/30 BCx: pending 6/30 UCx: pending    Thank you for allowing pharmacy to be a part of this patient's care.  Ramond Craver 07/21/2020 1:31 PM

## 2020-07-21 NOTE — H&P (Signed)
History and Physical  Allen Yu:[id number] DOB: [birth year] DOA: 07/21/2020   PCP: [name], MD   Patient coming from: Home  Chief Complaint: altered mental status  HPI:  Allen Yu is a 75 y.o. 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  The patient is unable to give any significant history secondary to his altered mental status.  All of this history is obtained from review of medical record and speaking with the patient spouse.  Accordingly, the patient was recently discharged from the hospital after a stay from 06/28/2020 to 06/30/2020 when he underwent a left AKA on 06/28/2020 secondary to a poorly healing left heel ulceration.  His hospitalization was complicated by AKI as well as acute blood loss anemia.  He also had a self-limited episode of atrial fibrillation.  Anticoagulation was deferred secondary to his high fall risk and low A. fib burden.  Nevertheless, the patient's spouse states that he has had apathy and has been less communicative over the past week.  She states that he has quit taking all his medications which at baseline he normally is able to dispense to himself.  She has noted that he has been less talkative and more agitated.  There is been no reports of fevers, chills, chest pain, shortness of breath, vomiting, diarrhea.  She was concerned about a possible UTI and contacted the patient's PCP.  Apparently a urine sample was sent to [facility] on 07/19/2020.  Results are unknown at this time.  Patient was not started on any antibiotics.  However, the patient was prescribed tramadol on 07/18/2020 by his PCP.  He quit taking his Percocet that was prescribed at the time of discharge from the hospital from his last admission because it was causing constipation.  There is  been no other new medications. In the emergency department, the patient was afebrile and hemodynamically stable.  He was tachycardic initially in the 140s.  Oxygen saturation was 100% on room air.  BMP shows sodium 129, potassium 4.3, serum creatinine 2.37.  LFTs were unremarkable.  WBC 22.3, hemoglobin 9.8, platelets 279,000.  Lactic acid 1.6.  CT of the brain was negative for acute findings.  Chest x-ray showed hyperinflation.  The patient was admitted secondary to altered mental status and AKI with SIRS criteria.  Assessment/Plan: SIRS -Patient presented with tachycardia and leukocytosis -Blood cultures x2 sets -UA and urine culture -Chest x-ray negative for infiltrates -Start empiric vancomycin and cefepime  Acute metabolic encephalopathy -Secondary to AKI and possible infectious process -M22 -TSH -Folic acid  Acute on chronic renal failure--CKD stage IIIa -Baseline creatinine 0.9-1.2 -Presented with serum creatinine 2.37 -Secondary to volume depletion -Start IV fluids  Abdominal pain -CT abdomen and pelvis -Check lipase  Hyponatremia -Secondary to volume depletion and poor solute intake -Start IV fluids  Uncontrolled diabetes mellitus type 2 with hyperglycemia -05/19/2020 hemoglobin A1c 9.2 -NovoLog sliding scale  Essential hypertension -Holding losartan secondary to AKI -Restart metoprolol titrate  Hyperlipidemia -Restart statin  Dementia -Restart Aricept       Past Medical History:  Diagnosis Date   Anemia    Arthritis    all over   Carotid stenosis    Chronic back pain    Chronic kidney disease    Constipation    Dementia (HCC)    Diabetes mellitus  GERD (gastroesophageal reflux disease)    History of kidney stones    Hypertension    no per patient   Lung nodule    Peripheral neuropathy    Peripheral vascular disease (HCC)    PTSD (post-traumatic stress disorder)    PTSD (post-traumatic stress disorder)    Schizophrenia ([place])     Tuberculosis    x 1 - treated -3 times   Past Surgical History:  Procedure Laterality Date   ABDOMINAL AORTOGRAM W/LOWER EXTREMITY N/A 05/20/2020   Procedure: ABDOMINAL AORTOGRAM W/LOWER EXTREMITY;  Surgeon: Elam Dutch, MD;  Location: [facility];  Service: Cardiovascular;  Laterality: N/A;   AMPUTATION Left 06/28/2020   Procedure: AMPUTATION ABOVE KNEE LEFT;  Surgeon: Rosetta Posner, MD;  Location: [facility] OR;  Service: Vascular;  Laterality: Left;   [place], 2013   x2   BLADDER SURGERY     02   CHOLECYSTECTOMY     COLONOSCOPY     COLONOSCOPY N/A 12/07/2014   Procedure: COLONOSCOPY;  Surgeon: Daneil Dolin, MD;  [facility];  Service: Endoscopy;  Laterality: N/A;  10:30 Am   EYE SURGERY Bilateral    removed metal from eye   FEMORAL-TIBIAL BYPASS GRAFT Left 05/27/2020   Procedure: LEFT FEMORAL TO PERONEAL ARTERY BYPASS;  Surgeon: Rosetta Posner, MD;  Location: [facility];  Service: Vascular;  Laterality: Left;   HERNIA REPAIR Right    INGUINAL HERNIA REPAIR Left 11/03/2013   Procedure: HERNIA REPAIR INGUINAL ADULT;  Surgeon: Gayland Curry, MD;  Location: [facility];  Service: General;  Laterality: Left;   SHOULDER SURGERY     RIGHT SHOULDER    VIDEO BRONCHOSCOPY WITH ENDOBRONCHIAL NAVIGATION N/A 07/10/2019   Procedure: VIDEO BRONCHOSCOPY WITH ENDOBRONCHIAL NAVIGATION;  Surgeon: Melrose Nakayama, MD;  Location: [facility];  Service: Thoracic;  Laterality: N/A;   Social History:  reports that he has been smoking cigarettes. He has been smoking an average of 0.50 packs per day. He has never used smokeless tobacco. He reports current drug use. Drug: Marijuana. He reports that he does not drink alcohol.   Family History  Problem Relation Age of Onset   Heart disease Mother        before age 60     Allergies  Allergen Reactions   Bee Venom Anaphylaxis   Codeine Other (See Comments)    incoherent    Diovan [Valsartan] Other (See Comments)     incoherent   Propoxyphene Other (See Comments)    Dizziness, "Makes me feel drunk"     Prior to Admission medications   Medication Sig Start Date End Date Taking? Authorizing Provider  acetaminophen (TYLENOL) 500 MG tablet Take 500 mg by mouth every 6 (six) hours as needed for moderate pain.   Yes [provider]  aspirin EC 81 MG tablet Take 1 tablet (81 mg total) by mouth 2 (two) times daily for 30 days, THEN 1 tablet (81 mg total) daily. 06/30/20 10/28/20 Yes Mercy Riding, MD  atorvastatin (LIPITOR) 40 MG tablet Take 1 tablet (40 mg total) by mouth at bedtime. 05/21/20 08/19/20 Yes [place] Eric J, DO  donepezil (ARICEPT) 10 MG tablet Take 10 mg by mouth at bedtime.  06/28/16  Yes [provider]  gabapentin (NEURONTIN) 100 MG capsule Take 100 mg by mouth at bedtime. 11/06/19  Yes [provider]  LANTUS 100 UNIT/ML injection Inject 0.1 mLs (10 Units total) into  the skin at bedtime as needed (High blood glucose). 06/30/20  Yes Mercy Riding, MD  losartan (COZAAR) 50 MG tablet Take 50 mg by mouth at bedtime.   Yes [provider]  metoprolol tartrate (LOPRESSOR) 25 MG tablet Take 1 tablet (25 mg total) by mouth 2 (two) times daily. 06/30/20 06/30/21 Yes Mercy Riding, MD  Multiple Vitamins-Minerals (MULTIVITAMIN WITH MINERALS) tablet Take 1 tablet by mouth daily. 50 plus   Yes [provider]  nicotine (NICODERM CQ - DOSED IN MG/24 HOURS) 21 mg/24hr patch Place 1 patch (21 mg total) onto the skin daily as needed (Cessation of smoking). 06/30/20  Yes Mercy Riding, MD  oxyCODONE-acetaminophen (PERCOCET) 5-325 MG tablet Take 1 tablet by mouth every 6 (six) hours as needed for severe pain. Do not take with Acetaminophen (Tylenol) 07/01/20 07/01/21 Yes Baglia, Corrina, PA-C  traMADol (ULTRAM) 50 MG tablet Take 50 mg by mouth every 6 (six) hours as needed for moderate pain. 07/18/20  Yes [provider]  B-D UF III MINI PEN NEEDLES 31G X 5 MM MISC SMARTSIG:1 Each  SUB-Q Daily 12/23/19   [provider]  feeding supplement (ENSURE ENLIVE / ENSURE PLUS) LIQD Take 237 mLs by mouth 2 (two) times daily between meals. 06/30/20 07/30/20  Mercy Riding, MD  insulin lispro (HUMALOG) 100 UNIT/ML injection Inject 5-10 Units into the skin daily as needed for high blood sugar.  Patient not taking: Reported on 07/21/2020    [provider]  oxyCODONE-acetaminophen (PERCOCET/ROXICET) 5-325 MG tablet Take 1 tablet by mouth every 4 (four) hours as needed for severe pain. Patient not taking: Reported on 07/21/2020 06/17/20   Waynetta Sandy, MD    Review of Systems:  Unobtainable due to altered mental status  Physical Exam: Vitals:   07/21/20 1059 07/21/20 1130 07/21/20 1200 07/21/20 1227  BP: 112/72 (!) 148/82 137/82 121/77  Pulse: (!) 112 90 97 (!) 112  Resp: 18 18 20 17   Temp:    98 F (36.7 C)  TempSrc:    Oral  SpO2: 100% 100% 100% 97%  Weight:      Height:       General:  A&O x 1, NAD, nontoxic, pleasant/cooperative Head/Eye: No conjunctival hemorrhage, no icterus, [place]/AT, No nystagmus ENT:  No icterus,  No thrush, good dentition, no pharyngeal exudate Neck:  No masses, no lymphadenpathy, no bruits CV:  RRR, no rub, no gallop, no S3 Lung:  CTAB, good air movement, no wheeze, no rhonchi Abdomen: soft/periumbilical tender, +BS, nondistended, no peritoneal signs Ext: No cyanosis, No rashes, No petechiae, No lymphangitis, No edema; L-AKA site without edema, erythema, drainage   Labs on Admission:  Basic Metabolic Panel: Recent Labs  Lab 07/21/20 1055  NA 129*  K 4.3  CL 94*  CO2 25  GLUCOSE 350*  BUN 82*  CREATININE 2.37*  CALCIUM 9.4   Liver Function Tests: Recent Labs  Lab 07/21/20 1055  AST 17  ALT 16  ALKPHOS 117  BILITOT 1.4*  PROT 6.9  ALBUMIN 3.4*   No results for input(s): LIPASE, AMYLASE in the last 168 hours. No results for input(s): AMMONIA in the last 168 hours. CBC: Recent Labs  Lab 07/21/20 1055   WBC 22.3*  NEUTROABS 19.8*  HGB 9.8*  HCT 29.8*  MCV 95.2  PLT 279   Coagulation Profile: No results for input(s): INR, PROTIME in the last 168 hours. Cardiac Enzymes: No results for input(s): CKTOTAL, CKMB, CKMBINDEX, TROPONINI in the last 168  hours. BNP: Invalid input(s): POCBNP CBG: No results for input(s): GLUCAP in the last 168 hours. Urine analysis:    Component Value Date/Time   COLORURINE YELLOW 06/29/2020 0519   APPEARANCEUR HAZY (A) 06/29/2020 0519   LABSPEC 1.014 06/29/2020 0519   PHURINE 5.0 06/29/2020 0519   GLUCOSEU >=500 (A) 06/29/2020 0519   HGBUR MODERATE (A) 06/29/2020 0519   BILIRUBINUR NEGATIVE 06/29/2020 0519   KETONESUR 5 (A) 06/29/2020 0519   PROTEINUR 100 (A) 06/29/2020 0519   UROBILINOGEN 0.2 02/18/2014 0950   NITRITE POSITIVE (A) 06/29/2020 0519   LEUKOCYTESUR MODERATE (A) 06/29/2020 0519   Sepsis Labs: @LABRCNTIP (procalcitonin:4,lacticidven:4) ) Recent Results (from the past 240 hour(s))  Culture, blood (routine x 2)     Status: None (Preliminary result)   Collection Time: 07/21/20 12:43 PM   Specimen: BLOOD  Result Value Ref Range Status   Specimen Description BLOOD LEFT ARM  Final   Special Requests   Final    BOTTLES DRAWN AEROBIC AND ANAEROBIC Blood Culture adequate volume Performed at [facility], [address], [place], [place] [address]    Culture PENDING  Incomplete   Report Status PENDING  Incomplete     Radiological Exams on Admission: DG Chest 1 View  Result Date: 07/21/2020 CLINICAL DATA:  75 year old male with confusion and weakness. Recent UTI. EXAM: CHEST  1 VIEW COMPARISON:  Chest CTA 06/29/2020 and earlier. FINDINGS: Portable AP upright view at 1019 hours. Stable lung volumes and mediastinal contours. Emphysema demonstrated on the prior CTA. The mildly spiculated right upper lobe pulmonary nodule seen by CT is poorly visible with plain radiograph technique. Allowing for portable technique the lungs are clear. Visualized  tracheal air column is within normal limits. Prior cervical ACDF. No acute osseous abnormality identified. Negative visible bowel gas pattern. IMPRESSION: 1. The suspicious right upper lobe pulmonary nodule seen by CT 06/29/2020 is occult by portable x-ray. 2. Emphysema.  No new cardiopulmonary abnormality. Electronically Signed   By: Genevie Ann M.D.   On: 07/21/2020 11:01   CT Head Wo Contrast  Result Date: 07/21/2020 CLINICAL DATA:  75 year old male with confusion, agitation, altered mental status. EXAM: CT HEAD WITHOUT CONTRAST TECHNIQUE: Contiguous axial images were obtained from the base of the skull through the vertex without intravenous contrast. COMPARISON:  Brain MRI 08/27/2018.  Head CT 07/08/2015. FINDINGS: Brain: Mild cerebral volume loss since 2017 appears to be generalized. No midline shift, ventriculomegaly, mass effect, evidence of mass lesion, intracranial hemorrhage or evidence of cortically based acute infarction. Patchy and confluent bilateral cerebral white matter hypodensity has progressed since 2017 but appears stable to the 2020 MRI. Bilateral basal ganglia vascular calcifications. No cortical encephalomalacia. Vascular: Calcified atherosclerosis at the skull base. No suspicious intracranial vascular hyperdensity. Skull: Mild chronic right lamina papyracea fracture. Otherwise negative. No acute osseous abnormality identified. Sinuses/Orbits: Visualized paranasal sinuses and mastoids are stable and well aerated. Other: Visualized orbits and scalp soft tissues are within normal limits. IMPRESSION: 1. No acute intracranial abnormality. 2. Chronic small vessel disease appears stable since a 2020 MRI. Electronically Signed   By: Genevie Ann M.D.   On: 07/21/2020 11:05    EKG: Independently reviewed. Sinus tachycardia; no STT changes    Time spent:60 minutes Code Status:   FULL Family Communication:  spouse updated at bedside 6/29 Disposition Plan: expect 2 day hospitalization Consults  called: none  DVT Prophylaxis: [place] Lovenox  Orson Eva, DO  [facility] Pager [phone]  If 7PM-7AM, please contact night-coverage [email or web address] Password [facility] 07/21/2020,  1:00 PM

## 2020-07-21 NOTE — ED Triage Notes (Signed)
Pt to er room number 7 via wc, pt his L leg amputee, family states that pt is here for confusion and agitation, states that he was dx with a uti and they feel that he has another one, pt is very hard of hearing and weak.

## 2020-07-21 NOTE — Progress Notes (Addendum)
RN called due to patient going into paroxysmal atrial fibrillation.  EKG was done and showed A. fib with RVR (rate currently at 146).  IV Cardizem 5 mg x 1 was given with possible plan on starting IV rate control drip (since patient was not taking any oral meds) if patient continues to remain in A. fib. Lovenox per pharmacy dosing will be given.  11:20 PM Patient's HR improved but persistently still ranged within 121-137 per RN.  BP is soft.  Patient will be transferred to stepdown and will be started on heparin drip.  We shall continue to monitor patient and continue treatment accordingly.

## 2020-07-22 ENCOUNTER — Other Ambulatory Visit: Payer: Self-pay | Admitting: *Deleted

## 2020-07-22 ENCOUNTER — Encounter (HOSPITAL_COMMUNITY): Payer: Self-pay | Admitting: Internal Medicine

## 2020-07-22 DIAGNOSIS — I4891 Unspecified atrial fibrillation: Secondary | ICD-10-CM

## 2020-07-22 DIAGNOSIS — I480 Paroxysmal atrial fibrillation: Secondary | ICD-10-CM

## 2020-07-22 DIAGNOSIS — Z515 Encounter for palliative care: Secondary | ICD-10-CM

## 2020-07-22 DIAGNOSIS — I739 Peripheral vascular disease, unspecified: Secondary | ICD-10-CM

## 2020-07-22 DIAGNOSIS — Z7189 Other specified counseling: Secondary | ICD-10-CM

## 2020-07-22 DIAGNOSIS — N3080 Other cystitis without hematuria: Secondary | ICD-10-CM

## 2020-07-22 DIAGNOSIS — H5200 Hypermetropia, unspecified eye: Secondary | ICD-10-CM | POA: Insufficient documentation

## 2020-07-22 DIAGNOSIS — A4159 Other Gram-negative sepsis: Secondary | ICD-10-CM

## 2020-07-22 LAB — RAPID URINE DRUG SCREEN, HOSP PERFORMED
Amphetamines: NOT DETECTED
Barbiturates: NOT DETECTED
Benzodiazepines: NOT DETECTED
Cocaine: NOT DETECTED
Opiates: NOT DETECTED
Tetrahydrocannabinol: POSITIVE — AB

## 2020-07-22 LAB — URINALYSIS, ROUTINE W REFLEX MICROSCOPIC
Bacteria, UA: NONE SEEN
Bilirubin Urine: NEGATIVE
Glucose, UA: 50 mg/dL — AB
Ketones, ur: 5 mg/dL — AB
Nitrite: NEGATIVE
Protein, ur: 100 mg/dL — AB
RBC / HPF: >50 RBC/hpf — ABNORMAL HIGH (ref 0–5)
Specific Gravity, Urine: 1.013 (ref 1.005–1.030)
WBC, UA: >50 WBC/hpf — ABNORMAL HIGH (ref 0–5)
pH: 7 (ref 5.0–8.0)

## 2020-07-22 LAB — COMPREHENSIVE METABOLIC PANEL
ALT: 15 U/L (ref 0–44)
AST: 18 U/L (ref 15–41)
Albumin: 2.7 g/dL — ABNORMAL LOW (ref 3.5–5.0)
Alkaline Phosphatase: 103 U/L (ref 38–126)
Anion gap: 12 (ref 5–15)
BUN: 88 mg/dL — ABNORMAL HIGH (ref 8–23)
CO2: 24 mmol/L (ref 22–32)
Calcium: 8.8 mg/dL — ABNORMAL LOW (ref 8.9–10.3)
Chloride: 96 mmol/L — ABNORMAL LOW (ref 98–111)
Creatinine, Ser: 2.54 mg/dL — ABNORMAL HIGH (ref 0.61–1.24)
GFR, Estimated: 26 mL/min — ABNORMAL LOW (ref >60)
Glucose, Bld: 344 mg/dL — ABNORMAL HIGH (ref 70–99)
Potassium: 4.6 mmol/L (ref 3.5–5.1)
Sodium: 132 mmol/L — ABNORMAL LOW (ref 135–145)
Total Bilirubin: 1.3 mg/dL — ABNORMAL HIGH (ref 0.3–1.2)
Total Protein: 6 g/dL — ABNORMAL LOW (ref 6.5–8.1)

## 2020-07-22 LAB — BLOOD CULTURE ID PANEL (REFLEXED) - BCID2

## 2020-07-22 LAB — CBC
HCT: 27.3 % — ABNORMAL LOW (ref 39.0–52.0)
Hemoglobin: 9 g/dL — ABNORMAL LOW (ref 13.0–17.0)
MCH: 31.3 pg (ref 26.0–34.0)
MCHC: 33 g/dL (ref 30.0–36.0)
MCV: 94.8 fL (ref 80.0–100.0)
Platelets: 178 10*3/uL (ref 150–400)
RBC: 2.88 MIL/uL — ABNORMAL LOW (ref 4.22–5.81)
RDW: 15.9 % — ABNORMAL HIGH (ref 11.5–15.5)
WBC: 22.7 10*3/uL — ABNORMAL HIGH (ref 4.0–10.5)
nRBC: 0 % (ref 0.0–0.2)

## 2020-07-22 LAB — MRSA NEXT GEN BY PCR, NASAL: MRSA by PCR Next Gen: NOT DETECTED

## 2020-07-22 LAB — T4, FREE: Free T4: 1.09 ng/dL (ref 0.61–1.12)

## 2020-07-22 MED ORDER — SODIUM CHLORIDE 0.9 % IV SOLN
2.0000 g | INTRAVENOUS | Status: DC
  Administered 2020-07-22 – 2020-07-25 (×4): 2 g via INTRAVENOUS
  Filled 2020-07-22 (×4): qty 20

## 2020-07-22 MED ORDER — INSULIN GLARGINE 100 UNIT/ML ~~LOC~~ SOLN
10.0000 [IU] | Freq: Every day | SUBCUTANEOUS | Status: DC
  Administered 2020-07-22 – 2020-07-24 (×3): 10 [IU] via SUBCUTANEOUS
  Filled 2020-07-22 (×7): qty 0.1

## 2020-07-22 MED ORDER — SODIUM CHLORIDE 0.9 % IV SOLN: INTRAVENOUS | Status: DC

## 2020-07-22 MED ORDER — ENOXAPARIN SODIUM 60 MG/0.6ML IJ SOSY
60.0000 mg | PREFILLED_SYRINGE | INTRAMUSCULAR | Status: DC
  Administered 2020-07-22 – 2020-07-24 (×2): 60 mg via SUBCUTANEOUS
  Filled 2020-07-22 (×3): qty 0.6

## 2020-07-22 MED ORDER — METOPROLOL TARTRATE 5 MG/5ML IV SOLN
2.5000 mg | Freq: Four times a day (QID) | INTRAVENOUS | Status: AC
  Administered 2020-07-22 – 2020-07-24 (×7): 2.5 mg via INTRAVENOUS
  Filled 2020-07-22 (×7): qty 5

## 2020-07-22 MED ORDER — CHLORHEXIDINE GLUCONATE CLOTH 2 % EX PADS
6.0000 | MEDICATED_PAD | Freq: Every day | CUTANEOUS | Status: DC
  Administered 2020-07-22 – 2020-07-25 (×4): 6 via TOPICAL

## 2020-07-22 MED ORDER — AMIODARONE HCL IN DEXTROSE 360-4.14 MG/200ML-% IV SOLN
30.0000 mg/h | INTRAVENOUS | Status: DC
  Administered 2020-07-22 – 2020-07-24 (×5): 30 mg/h via INTRAVENOUS
  Filled 2020-07-22 (×9): qty 200

## 2020-07-22 MED ORDER — GLUCERNA SHAKE PO LIQD
237.0000 mL | Freq: Three times a day (TID) | ORAL | Status: DC
  Administered 2020-07-22 – 2020-07-25 (×4): 237 mL via ORAL

## 2020-07-22 NOTE — Care Management Important Message (Signed)
Important Message  Patient Details  Name: Allen Yu MRN: [id number] Date of Birth: 05/01/1945   Medicare Important Message Given:  Yes     [name] 07/22/2020, 4:33 PM

## 2020-07-22 NOTE — Patient Outreach (Signed)
[facility]) Care Management  07/22/2020  Allen Yu 1945/07/03 [id number]   [place] coordination  Noted with admission since 07/21/20 for altered mental status and AKI with SIRS criteria.  During the last outreach [name], wife requested assist with providers for eye and hearing exams [facility] RN CM was able to locate on [employer] online site the following in network providers to offer to her   [facility] [place], [place] [facility] [address] [place] [phone] [place], [place] [address]  [facility] [place] [phone] [place] [facility] [address] [place] [address] [place] [address] [place] [address] [place] [address] [phone] [place] [facility] [address] [place]  [facility] [address] [place] [phone]  [facility] [place] [address] [place] [phone] Hearing care center [address] [place] [facility] [address] [place] [phone] [place], [place] [address] [place] [facility] [place],  [address] [place] [address] [place] [address] Sent this list from online to wife documented email address in [place] also  Plan Will collaborate with Shannon and follow up with patient pending hospital discharge  [facility]. [name], RN, BSN, [place] Coordinator Office number [phone] Mobile number [phone]  Main [facility] number [id number] Fax number [phone]

## 2020-07-22 NOTE — Progress Notes (Signed)
ANTICOAGULATION CONSULT NOTE - Initial Consult  Pharmacy Consult for Lovenox  Indication: atrial fibrillation  Allergies  Allergen Reactions   Bee Venom Anaphylaxis   Codeine Other (See Comments)    incoherent    Diovan [Valsartan] Other (See Comments)    incoherent   Propoxyphene Other (See Comments)    Dizziness, "Makes me feel drunk"    Patient Measurements: Height: 6\' 1"  (185.4 cm) Weight: 57.6 kg (127 lb) IBW/kg (Calculated) : 79.9   Vital Signs: Temp: 97.8 F (36.6 C) (07/01 0030) Temp Source: Oral (07/01 0030) BP: 149/66 (07/01 0100) Pulse Rate: 38 (07/01 0100)  Labs: Recent Labs    07/21/20 1055  HGB 9.8*  HCT 29.8*  PLT 279  CREATININE 2.37*    Estimated Creatinine Clearance: 22.3 mL/min (A) (by C-G formula based on SCr of 2.37 mg/dL (H)).   Medical History: Past Medical History:  Diagnosis Date   Anemia    Arthritis    all over   Carotid stenosis    Chronic back pain    Chronic kidney disease    Constipation    Dementia (HCC)    Diabetes mellitus    GERD (gastroesophageal reflux disease)    History of kidney stones    Hypertension    no per patient   Lung nodule    Peripheral neuropathy    Peripheral vascular disease (HCC)    PTSD (post-traumatic stress disorder)    PTSD (post-traumatic stress disorder)    Schizophrenia (HCC)    Tuberculosis    x 1 - treated -3 times    Assessment: 75 y/o M with altered mental status, now going in and out of afib, starting Lovenox per pharmacy, renal function requiring dosing adjustment to q24h frequency. Hgb 9.8.   Goal of Therapy:  Monitor platelets by anticoagulation protocol: Yes   Plan:  Lovenox 60 mg subcutaneous q24h Daily CBC Monitor for bleeding  Narda Bonds, PharmD, BCPS Clinical Pharmacist Phone: [phone]

## 2020-07-22 NOTE — Progress Notes (Signed)
Initial Nutrition Assessment  DOCUMENTATION CODES:   Severe malnutrition in context of chronic illness  INTERVENTION:  Glucerna Shake po TID, each supplement provides 220 kcal and 10 grams of protein   Magic cup daily with lunch  each supplement provides 290 kcal and 9 grams of protein   NUTRITION DIAGNOSIS:   Severe Malnutrition related to chronic illness, acute illness (Acute on chronic CKD, Dementia) as evidenced by meal completion < 50%, severe fat depletion, severe muscle depletion.   GOAL:  Provide needs based on ASPEN/SCCM guidelines  MONITOR:  PO intake, Supplement acceptance, Weight trends, Skin, Labs, I & O's  REASON FOR ASSESSMENT:   Malnutrition Screening Tool    ASSESSMENT: Patient is a 75 yo male with hx of DM, GERD, Dementia, Constipation, CKD-3a, anemia, PTSD, HTN, PVD. Presents with SIRS, AMS, UTI, hyponatremia and abdominal pain.  S/P left AKA- 06/28/20  CHO modified diet. Patient has poor appetite- <50% of meals consumed. Patient unable to provide diet recall. Refused all his meds per nursing.   Hx of weight loss prior to AKA. 07/10/19- pt 72.6 kg. He experienced wt loss between last June and November -54.9 kg. Since then his weight has ranged 55-59 kg. Current weight is not reflecting expected significant loss following AKA earlier this month.   Medications reviewed and include: Aricept, Lantus, MVI, lipitor.  Labs: last A1C- 4/28- 9.2% (H)  BMP Latest Ref Rng & Units 07/22/2020 07/21/2020 06/29/2020  Glucose 70 - 99 mg/dL 344(H) 350(H) 101(H)  BUN 8 - 23 mg/dL 88(H) 82(H) 12  Creatinine 0.61 - 1.24 mg/dL 2.54(H) 2.37(H) 0.97  BUN/Creat Ratio 6 - 22 (calc) - - -  Sodium 135 - 145 mmol/L 132(L) 129(L) 138  Potassium 3.5 - 5.1 mmol/L 4.6 4.3 3.5  Chloride 98 - 111 mmol/L 96(L) 94(L) 100  CO2 22 - 32 mmol/L 24 25 29   Calcium 8.9 - 10.3 mg/dL 8.8(L) 9.4 8.9     NUTRITION - FOCUSED PHYSICAL EXAM: Nutrition-Focused physical exam findings are severe thoracic,  upper arms, moderate buccal fat depletion, severe clavicle, acromion region, scapular,(right patellar) muscle depletion, and no edema.     Diet Order:   Diet Order             Diet Carb Modified Fluid consistency: Thin; Room service appropriate? Yes  Diet effective now                   EDUCATION NEEDS:  Not appropriate for education at this time  Skin:  Skin Assessment: Skin Integrity Issues: Skin Integrity Issues:: Incisions Incisions: closed with staples. Pt s/p left AKA 6/7 - no drainage per nursing  Last BM:  7/1 type 6  Height:   Ht Readings from Last 1 Encounters:  07/21/20 6\' 1"  (1.854 m)    Weight:   Wt Readings from Last 1 Encounters:  07/21/20 57.6 kg    Adjusted Ideal Body Weight:   77.2 kg  Adjusted BMI:  18.1 underweight  Estimated Nutritional Needs:   Kcal:  4332-9518  Protein:  81-93 gr  Fluid:  >1700 ml daily   Colman Cater MS,RD,CSG,LDN Contact: Shea Evans

## 2020-07-22 NOTE — Progress Notes (Addendum)
PROGRESS NOTE  Allen Yu VQM:[id number] DOB: [birth year] DOA: 07/21/2020 PCP: [name], MD  Brief History:   75 y.o. 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  The patient is unable to give any significant history secondary to his altered mental status.  All of this history is obtained from review of medical record and speaking with the patient spouse.  Accordingly, the patient was recently discharged from the hospital after a stay from 06/28/2020 to 06/30/2020 when he underwent a left AKA on 06/28/2020 secondary to a poorly healing left heel ulceration.  His hospitalization was complicated by AKI as well as acute blood loss anemia.  He also had a self-limited episode of atrial fibrillation.  Anticoagulation was deferred secondary to his high fall risk and low A. fib burden.  Nevertheless, the patient's spouse states that he has had apathy and has been less communicative over the past week.  She states that he has quit taking all his medications which at baseline he normally is able to dispense to himself.  She has noted that he has been less talkative and more agitated.  She was concerned about a possible UTI and contacted the patient's PCP.  Apparently a urine sample was sent to [facility] on 07/19/2020.  Results are unknown at this time.  Patient was not started on any antibiotics.  However, the patient was prescribed tramadol on 07/18/2020 by his PCP.  He quit taking his Percocet that was prescribed at the time of discharge from the hospital from his last admission because it was causing constipation.  There is been no other new medications. In the emergency department, the patient was afebrile and hemodynamically stable.  He was tachycardic initially in the 140s.  Oxygen saturation was  100% on room air.  BMP shows sodium 129, potassium 4.3, serum creatinine 2.37.  LFTs were unremarkable.  WBC 22.3, hemoglobin 9.8, platelets 279,000.  Lactic acid 1.6.  CT of the brain was negative for acute findings.  Chest x-ray showed hyperinflation.  The patient was admitted secondary to altered mental status and AKI with SIRS criteria.  Since admission, the patient was empirically started on vanco and cefepime after blood and urine cultures were obtained.  Blood cultures grew Klebsiella pneumoniae.  He was also noted to have 06/29/20 urine culture with Klebsiella pneumonia.  His antibiotics were narrowed to ceftriaxone.  CT of the abdomen showed diffuse urinary bladder wall emphysema with intraluminal air  and extension of air outside bladder wall with superior dissection along anterior peritoneal wall and pelvic side wall.  Assessment/Plan: Sepsis -Patient presented with tachycardia and leukocytosis -Blood cultures= Klebsiella -urine culture = pending -Chest x-ray negative for infiltrates -Started empiric vancomycin and cefepime>>>narrow to ceftriaxone  Emphysematous Cystitis -urine culture pending, but likely to be Klebsiella -CT abd as discussed above  Klebsiella Bacteremia -source is urine -continue ceftriaxone  Paroxysmal Atrial Fibrillation with RVR -developed evening 6/30 -continue amiodarone for now>>sinus -CHADS-VASc= 4, but not clear if he is good AC candidate long term -06/29/20 Echo--EF 60-65%, no WMA, normal PASP   Acute metabolic encephalopathy -Secondary to AKI and sepsis -remains confused and apathetic -D32--671 -IWP--8.099 -Folic IPJA--2.5   Acute on chronic renal failure--CKD stage IIIa -Baseline creatinine 0.9-1.2 -Presented with serum creatinine 2.37 -Secondary to volume depletion and sepsis -Continue IV  fluids   Hydronephrosis -due to emphysematous cystitis/Pyelonephritis -monitior clinically   Hyponatremia -Secondary to volume depletion and poor  solute intake -Start IV fluids<<improving   Uncontrolled diabetes mellitus type 2 with hyperglycemia -05/19/2020 hemoglobin A1c 9.2 -NovoLog sliding scale   Essential hypertension -Holding losartan secondary to AKI -Restart metoprolol>>convert to IV as patient is refusing po meds   Hyperlipidemia -Restart statin   Dementia -Restart Aricept     Status is: Inpatient  Remains inpatient appropriate because:IV treatments appropriate due to intensity of illness or inability to take PO  Dispo: The patient is from: Home              Anticipated d/c is to: Home              Patient currently is not medically stable to d/c.   Difficult to place patient No        Family Communication:   spouse updated 7/1  Consultants:  cardiology  Code Status:  FULL  DVT Prophylaxis: [place] Lovenox   Procedures: As Listed in Progress Note Above  Antibiotics: Ceftriaxone 7/1>> -vanc/cefepime 6/30>>7/1      Subjective: Patient is apathetic, only answering questions intermittently.  States "I hurt all over".  Denies cp, sob.  Refuses to answer any other questions  Objective: Vitals:   07/22/20 1200 07/22/20 1300 07/22/20 1400 07/22/20 1500  BP: (!) 124/56 127/61  (!) 117/59  Pulse:  93 91   Resp: (!) 21 20 20  (!) 22  Temp:      TempSrc:      SpO2:  99% 99%   Weight:      Height:        Intake/Output Summary (Last 24 hours) at 07/22/2020 1548 Last data filed at 07/22/2020 1503 Gross per 24 hour  Intake 3204.54 ml  Output 850 ml  Net 2354.54 ml   Weight change:  Exam:  General:  Pt is alert, not following commands appropriately, not in acute distress HEENT: No icterus, No thrush, No neck mass, [place]/AT Cardiovascular: RRR, S1/S2, no rubs, no gallops Respiratory: diminished BS.  Bibasilar rales. No wheeze Abdomen: Soft/+BS, non tender, non distended, no guarding Extremities: No edema, No lymphangitis, No petechiae, No rashes, no synovitis;  Left AKA   Data Reviewed: I  have personally reviewed following labs and imaging studies Basic Metabolic Panel: Recent Labs  Lab 07/21/20 1055 07/22/20 0438  NA 129* 132*  K 4.3 4.6  CL 94* 96*  CO2 25 24  GLUCOSE 350* 344*  BUN 82* 88*  CREATININE 2.37* 2.54*  CALCIUM 9.4 8.8*   Liver Function Tests: Recent Labs  Lab 07/21/20 1055 07/22/20 0438  AST 17 18  ALT 16 15  ALKPHOS 117 103  BILITOT 1.4* 1.3*  PROT 6.9 6.0*  ALBUMIN 3.4* 2.7*   Recent Labs  Lab 07/21/20 1723  LIPASE 19   No results for input(s): AMMONIA in the last 168 hours. Coagulation Profile: No results for input(s): INR, PROTIME in the last 168 hours. CBC: Recent Labs  Lab 07/21/20 1055 07/22/20 0438  WBC 22.3* 22.7*  NEUTROABS 19.8*  --   HGB 9.8* 9.0*  HCT 29.8* 27.3*  MCV 95.2 94.8  PLT 279 178   Cardiac Enzymes: No results for input(s): CKTOTAL, CKMB, CKMBINDEX, TROPONINI in the last 168 hours. BNP: Invalid input(s): POCBNP CBG: Recent Labs  Lab 07/21/20 2049  GLUCAP 298*   HbA1C: No results for input(s): HGBA1C in the last 72 hours. Urine analysis:    Component Value  Date/Time   COLORURINE YELLOW 06/29/2020 0519   APPEARANCEUR HAZY (A) 06/29/2020 0519   LABSPEC 1.014 06/29/2020 0519   PHURINE 5.0 06/29/2020 0519   GLUCOSEU >=500 (A) 06/29/2020 0519   HGBUR MODERATE (A) 06/29/2020 0519   BILIRUBINUR NEGATIVE 06/29/2020 0519   KETONESUR 5 (A) 06/29/2020 0519   PROTEINUR 100 (A) 06/29/2020 0519   UROBILINOGEN 0.2 02/18/2014 0950   NITRITE POSITIVE (A) 06/29/2020 0519   LEUKOCYTESUR MODERATE (A) 06/29/2020 0519   Sepsis Labs: @LABRCNTIP (procalcitonin:4,lacticidven:4) ) Recent Results (from the past 240 hour(s))  Culture, blood (routine x 2)     Status: None (Preliminary result)   Collection Time: 07/21/20 12:43 PM   Specimen: BLOOD  Result Value Ref Range Status   Specimen Description   Final    BLOOD LEFT ARM Performed at [facility], [address], [place], [place] [address]    Special  Requests   Final    BOTTLES DRAWN AEROBIC AND ANAEROBIC Blood Culture adequate volume Performed at [facility], [address], [place], [place] [address]    Culture  Setup Time   Final    IN BOTH AEROBIC AND ANAEROBIC BOTTLES GRAM NEGATIVE RODS Gram Stain Report Called to,Read Back By and Verified With: PEACH,[email or web address]@[id number]  07/22/20 MKELLY Organism ID to follow Performed at [facility], [place] [address] [place], [place] [address]    Culture GRAM NEGATIVE RODS  Final   Report Status PENDING  Incomplete  Culture, blood (routine x 2)     Status: None (Preliminary result)   Collection Time: 07/21/20 12:43 PM   Specimen: BLOOD  Result Value Ref Range Status   Specimen Description BLOOD LEFT HAND  Final   Special Requests   Final    BOTTLES DRAWN AEROBIC ONLY Blood Culture results may not be optimal due to an inadequate volume of blood received in culture bottles   Culture  Setup Time   Final    AEROBIC BOTTLE ONLY GRAM NEGATIVE RODS Gram Stain Report Called to,Read Back By and Verified With: PEACH,[email or web address]@[id number]  07/22/20 MKELLY    Culture   Final    NO GROWTH 1 DAY Performed at [facility], [address] [place], [place] [address]    Report Status PENDING  Incomplete  Blood Culture ID Panel (Reflexed)     Status: Abnormal   Collection Time: 07/21/20 12:43 PM  Result Value Ref Range Status   Enterococcus faecalis NOT DETECTED NOT DETECTED Final   Enterococcus Faecium NOT DETECTED NOT DETECTED Final   Listeria monocytogenes NOT DETECTED NOT DETECTED Final   Staphylococcus species NOT DETECTED NOT DETECTED Final   Staphylococcus aureus (BCID) NOT DETECTED NOT DETECTED Final   Staphylococcus epidermidis NOT DETECTED NOT DETECTED Final   Staphylococcus lugdunensis NOT DETECTED NOT DETECTED Final   Streptococcus species NOT DETECTED NOT DETECTED Final   Streptococcus agalactiae NOT DETECTED NOT DETECTED Final   Streptococcus pneumoniae NOT DETECTED NOT DETECTED Final   Streptococcus  pyogenes NOT DETECTED NOT DETECTED Final   A.calcoaceticus-baumannii NOT DETECTED NOT DETECTED Final   Bacteroides fragilis NOT DETECTED NOT DETECTED Final   Enterobacterales DETECTED (A) NOT DETECTED Final    Comment: Enterobacterales represent a large order of gram negative bacteria, not a single organism. CRITICAL RESULT CALLED TO, READ BACK BY AND VERIFIED WITH: S. HALL PHARMD, AT 1038 07/22/20 D. VANHOOK    Enterobacter cloacae complex NOT DETECTED NOT DETECTED Final   Escherichia coli NOT DETECTED NOT DETECTED Final   Klebsiella aerogenes NOT DETECTED NOT DETECTED Final  Klebsiella oxytoca NOT DETECTED NOT DETECTED Final   Klebsiella pneumoniae DETECTED (A) NOT DETECTED Final    Comment: CRITICAL RESULT CALLED TO, READ BACK BY AND VERIFIED WITH: S. HALL PHARMD, AT 1038 07/22/20 D. VANHOOK    Proteus species NOT DETECTED NOT DETECTED Final   Salmonella species NOT DETECTED NOT DETECTED Final   Serratia marcescens NOT DETECTED NOT DETECTED Final   Haemophilus influenzae NOT DETECTED NOT DETECTED Final   Neisseria meningitidis NOT DETECTED NOT DETECTED Final   Pseudomonas aeruginosa NOT DETECTED NOT DETECTED Final   Stenotrophomonas maltophilia NOT DETECTED NOT DETECTED Final   Candida albicans NOT DETECTED NOT DETECTED Final   Candida auris NOT DETECTED NOT DETECTED Final   Candida glabrata NOT DETECTED NOT DETECTED Final   Candida krusei NOT DETECTED NOT DETECTED Final   Candida parapsilosis NOT DETECTED NOT DETECTED Final   Candida tropicalis NOT DETECTED NOT DETECTED Final   Cryptococcus neoformans/gattii NOT DETECTED NOT DETECTED Final   CTX-M ESBL NOT DETECTED NOT DETECTED Final   Carbapenem resistance IMP NOT DETECTED NOT DETECTED Final   Carbapenem resistance KPC NOT DETECTED NOT DETECTED Final   Carbapenem resistance NDM NOT DETECTED NOT DETECTED Final   Carbapenem resist OXA 48 LIKE NOT DETECTED NOT DETECTED Final   Carbapenem resistance VIM NOT DETECTED NOT DETECTED  Final    Comment: Performed at [facility], [address] [place], [place] [address]  MRSA Next Gen by PCR, Nasal     Status: None   Collection Time: 07/21/20 11:56 PM   Specimen: Nasal Mucosa; Nasal Swab  Result Value Ref Range Status   MRSA by PCR Next Gen NOT DETECTED NOT DETECTED Final    Comment: (NOTE) The GeneXpert MRSA Assay (FDA approved for NASAL specimens only), is one component of a comprehensive MRSA colonization surveillance program. It is not intended to diagnose MRSA infection nor to guide or monitor treatment for MRSA infections. Test performance is not FDA approved in patients less than 97 years old. Performed at [facility], [address], [place], [place] [address]      Scheduled Meds:  aspirin EC  81 mg Oral Daily   atorvastatin  40 mg Oral QHS   Chlorhexidine Gluconate Cloth  6 each Topical Daily   donepezil  10 mg Oral QHS   enoxaparin (LOVENOX) injection  60 mg Subcutaneous Q24H   feeding supplement (GLUCERNA SHAKE)  237 mL Oral TID BM   gabapentin  100 mg Oral QHS   insulin glargine  10 Units Subcutaneous Daily   metoprolol tartrate  25 mg Oral BID   multivitamin with minerals  1 tablet Oral Daily   Continuous Infusions:  amiodarone 30 mg/hr (07/22/20 1503)   cefTRIAXone (ROCEPHIN)  IV Stopped (07/22/20 1217)   lactated ringers 125 mL/hr at 07/22/20 1503    Procedures/Studies: CT ABDOMEN PELVIS WO CONTRAST  Result Date: 07/21/2020 CLINICAL DATA:  75 year old male with acute abdominal pain. EXAM: CT ABDOMEN AND PELVIS WITHOUT CONTRAST TECHNIQUE: Multidetector CT imaging of the abdomen and pelvis was performed following the standard protocol without IV contrast. COMPARISON:  CT abdomen pelvis dated 07/09/2016. FINDINGS: Evaluation of this exam is limited in the absence of intravenous contrast. Evaluation is also limited due to streak artifact caused by patient's arms and loss of intra-abdominal fat and cachexia as well as anasarca. Lower chest:  The visualized lung bases are clear. There is coronary vascular calcification. Hepatobiliary: The liver is grossly unremarkable. No intrahepatic biliary dilatation. Cholecystectomy. Pancreas: The pancreas  is poorly visualized. Spleen: The spleen is grossly unremarkable. Adrenals/Urinary Tract: Mild bilateral adrenal thickening. There is mild bilateral hydronephrosis. Probable bilateral renal vascular calcification. The urinary bladder is distended. There is diffuse bladder wall emphysema with large amount of intraluminal air. Findings consistent with emphysematous cystitis correlation with clinical exam and urinalysis recommended. There is extension of the air outside of the bladder wall and superior dissection along the fat planes of the anterior peritoneal wall and bilateral flank and pelvic sidewall. Clinical correlation recommended to evaluate for possibility of sepsis and peritonitis. Stomach/Bowel: No evidence of bowel obstruction. Evaluation of the bowel is however is limited due to factors above. Vascular/Lymphatic: Advanced aortoiliac atherosclerotic disease. Evaluation of the vasculature is very limited on this CT. Reproductive: The prostate gland is poorly visualized. There is air within the urethra. Other: There is loss of subcutaneous and intra-abdominal fat and cachexia. Musculoskeletal: Severe osteopenia with degenerative changes of the spine. L4-S1 posterior fusion. No acute osseous pathology. IMPRESSION: 1. Emphysematous cystitis with extension of air along the anterior peritoneal and lateral pelvic sidewalls. Clinical correlation is recommended to evaluate for possibility of sepsis and peritonitis. There is air within the urethra. 2. Mild bilateral hydronephrosis. 3. No bowel obstruction. 4. Aortic Atherosclerosis (ICD10-I70.0) and Emphysema (ICD10-J43.9). These results were called by telephone at the time of interpretation on 07/21/2020 at 8:36 pm to the patient's nurse, Panther, who verbally  acknowledged these results. Electronically Signed   By: Anner Crete M.D.   On: 07/21/2020 20:39   DG Chest 1 View  Result Date: 07/21/2020 CLINICAL DATA:  75 year old male with confusion and weakness. Recent UTI. EXAM: CHEST  1 VIEW COMPARISON:  Chest CTA 06/29/2020 and earlier. FINDINGS: Portable AP upright view at 1019 hours. Stable lung volumes and mediastinal contours. Emphysema demonstrated on the prior CTA. The mildly spiculated right upper lobe pulmonary nodule seen by CT is poorly visible with plain radiograph technique. Allowing for portable technique the lungs are clear. Visualized tracheal air column is within normal limits. Prior cervical ACDF. No acute osseous abnormality identified. Negative visible bowel gas pattern. IMPRESSION: 1. The suspicious right upper lobe pulmonary nodule seen by CT 06/29/2020 is occult by portable x-ray. 2. Emphysema.  No new cardiopulmonary abnormality. Electronically Signed   By: Genevie Ann M.D.   On: 07/21/2020 11:01   CT Head Wo Contrast  Result Date: 07/21/2020 CLINICAL DATA:  75 year old male with confusion, agitation, altered mental status. EXAM: CT HEAD WITHOUT CONTRAST TECHNIQUE: Contiguous axial images were obtained from the base of the skull through the vertex without intravenous contrast. COMPARISON:  Brain MRI 08/27/2018.  Head CT 07/08/2015. FINDINGS: Brain: Mild cerebral volume loss since 2017 appears to be generalized. No midline shift, ventriculomegaly, mass effect, evidence of mass lesion, intracranial hemorrhage or evidence of cortically based acute infarction. Patchy and confluent bilateral cerebral white matter hypodensity has progressed since 2017 but appears stable to the 2020 MRI. Bilateral basal ganglia vascular calcifications. No cortical encephalomalacia. Vascular: Calcified atherosclerosis at the skull base. No suspicious intracranial vascular hyperdensity. Skull: Mild chronic right lamina papyracea fracture. Otherwise negative. No acute  osseous abnormality identified. Sinuses/Orbits: Visualized paranasal sinuses and mastoids are stable and well aerated. Other: Visualized orbits and scalp soft tissues are within normal limits. IMPRESSION: 1. No acute intracranial abnormality. 2. Chronic small vessel disease appears stable since a 2020 MRI. Electronically Signed   By: Genevie Ann M.D.   On: 07/21/2020 11:05   CT Angio Chest Pulmonary Embolism (PE) W or WO Contrast  Result Date: 06/29/2020 CLINICAL DATA:  Rule out pulmonary embolus.  Elevated D-dimer EXAM: CT ANGIOGRAPHY CHEST WITH CONTRAST TECHNIQUE: Multidetector CT imaging of the chest was performed using the standard protocol during bolus administration of intravenous contrast. Multiplanar CT image reconstructions and MIPs were obtained to evaluate the vascular anatomy. CONTRAST:  84mL OMNIPAQUE IOHEXOL 350 MG/ML SOLN COMPARISON:  CT chest 06/30/2019 FINDINGS: Cardiovascular: Satisfactory opacification of the pulmonary arteries to the segmental level. No evidence of pulmonary embolism. Normal heart size. Aortic atherosclerosis. Coronary artery calcifications. No pericardial effusion. Mediastinum/Nodes: No enlarged mediastinal, hilar, or axillary lymph nodes. Thyroid gland, trachea, and esophagus demonstrate no significant findings. Lungs/Pleura: Moderate to advanced changes of centrilobular and paraseptal emphysema. Within the right upper lobe there is a solid-appearing nodule which measures 1.6 x 1.4 by 1.4 cm, image 32/3 Focal area of subpleural consolidation is noted within the posteromedial left base, image 74/7. Small left pleural effusion is present. Upper Abdomen: No acute abnormality. Musculoskeletal: No chest wall abnormality. No acute or significant osseous findings. Review of the MIP images confirms the above findings. IMPRESSION: 1. No evidence for acute pulmonary embolus. 2. Right upper lobe pulmonary nodule is identified measuring up to 1.6 cm. Suspicious for primary bronchogenic  carcinoma. Further evaluation with PET-CT as warranted. 3. Subpleural consolidation in the posterior left base with small left pleural effusion. Favor inflammatory or infectious etiology. A 3 month, short-term interval follow-up CT of the chest is recommended to ensure resolution. This could also be readdressed at PET-CT. 4. Aortic Atherosclerosis (ICD10-I70.0) and Emphysema (ICD10-J43.9). 5. Coronary artery calcifications. Electronically Signed   By: Kerby Moors M.D.   On: 06/29/2020 09:59   ECHOCARDIOGRAM COMPLETE  Result Date: 06/29/2020    ECHOCARDIOGRAM REPORT   Patient Name:   RANVIR RENOVATO Date of Exam: 06/29/2020 Medical Rec #:  [id number]      Height:       73.0 in Accession #:    [id number]     Weight:       127.2 lb Date of Birth:  02/21/1945     BSA:          1.775 m Patient Age:    60 years       BP:           159/83 mmHg Patient Gender: M              HR:           95 bpm. Exam Location:  Inpatient Procedure: 2D Echo, Cardiac Doppler and Color Doppler Indications:    I48.91* Unspeicified atrial fibrillation  History:        Patient has prior history of Echocardiogram examinations, most                 recent 07/10/2015. PAD. One day post BKA.  Sonographer:    Merrie Roof RDCS Referring Phys: [place]  1. Left ventricular ejection fraction, by estimation, is 60 to 65%. The left ventricle has normal function. The left ventricle has no regional wall motion abnormalities. Left ventricular diastolic parameters are indeterminate.  2. Right ventricular systolic function is normal. The right ventricular size is normal. There is normal pulmonary artery systolic pressure. The estimated right ventricular systolic pressure is 66.2 mmHg.  3. Left atrial size was mildly dilated.  4. Right atrial size was mildly dilated.  5. The mitral valve is normal in structure. Mild mitral valve regurgitation. No evidence of mitral stenosis.  6. The aortic valve  is normal in structure. Aortic valve  regurgitation is not visualized. Mild to moderate aortic valve sclerosis/calcification is present, without any evidence of aortic stenosis.  7. The inferior vena cava is normal in size with greater than 50% respiratory variability, suggesting right atrial pressure of 3 mmHg. FINDINGS  Left Ventricle: Left ventricular ejection fraction, by estimation, is 60 to 65%. The left ventricle has normal function. The left ventricle has no regional wall motion abnormalities. The left ventricular internal cavity size was normal in size. There is  no left ventricular hypertrophy. Left ventricular diastolic parameters are indeterminate. Right Ventricle: The right ventricular size is normal. No increase in right ventricular wall thickness. Right ventricular systolic function is normal. There is normal pulmonary artery systolic pressure. The tricuspid regurgitant velocity is 2.66 m/s, and  with an assumed right atrial pressure of 3 mmHg, the estimated right ventricular systolic pressure is 79.8 mmHg. Left Atrium: Left atrial size was mildly dilated. Right Atrium: Right atrial size was mildly dilated. Pericardium: There is no evidence of pericardial effusion. Mitral Valve: The mitral valve is normal in structure. Mild mitral valve regurgitation. No evidence of mitral valve stenosis. Tricuspid Valve: The tricuspid valve is normal in structure. Tricuspid valve regurgitation is mild . No evidence of tricuspid stenosis. Aortic Valve: The aortic valve is normal in structure. Aortic valve regurgitation is not visualized. Mild to moderate aortic valve sclerosis/calcification is present, without any evidence of aortic stenosis. Aortic valve mean gradient measures 4.0 mmHg. Aortic valve peak gradient measures 7.8 mmHg. Aortic valve area, by VTI measures 2.01 cm. Pulmonic Valve: The pulmonic valve was normal in structure. Pulmonic valve regurgitation is not visualized. No evidence of pulmonic stenosis. Aorta: The aortic root is normal in  size and structure. Venous: The inferior vena cava is normal in size with greater than 50% respiratory variability, suggesting right atrial pressure of 3 mmHg. IAS/Shunts: No atrial level shunt detected by color flow Doppler.  LEFT VENTRICLE PLAX 2D LVIDd:         5.10 cm LVIDs:         3.80 cm LV PW:         1.00 cm LV IVS:        1.00 cm LVOT diam:     2.00 cm LV SV:         54 LV SV Index:   30 LVOT Area:     3.14 cm  RIGHT VENTRICLE          IVC RV Basal diam:  3.90 cm  IVC diam: 1.60 cm LEFT ATRIUM             Index       RIGHT ATRIUM           Index LA diam:        3.80 cm 2.14 cm/m  RA Area:     20.00 cm LA Vol (A2C):   69.7 ml 39.26 ml/m RA Volume:   59.60 ml  33.57 ml/m LA Vol (A4C):   69.6 ml 39.21 ml/m LA Biplane Vol: 72.4 ml 40.78 ml/m  AORTIC VALVE AV Area (Vmax):    2.05 cm AV Area (Vmean):   2.12 cm AV Area (VTI):     2.01 cm AV Vmax:           140.00 cm/s AV Vmean:          92.800 cm/s AV VTI:            0.267 m AV Peak  Grad:      7.8 mmHg AV Mean Grad:      4.0 mmHg LVOT Vmax:         91.40 cm/s LVOT Vmean:        62.500 cm/s LVOT VTI:          0.171 m LVOT/AV VTI ratio: 0.64  AORTA Ao Root diam: 3.60 cm TRICUSPID VALVE TR Peak grad:   28.3 mmHg TR Vmax:        266.00 cm/s  SHUNTS Systemic VTI:  0.17 m Systemic Diam: 2.00 cm Candee Furbish MD Electronically signed by Candee Furbish MD Signature Date/Time: 06/29/2020/5:23:45 PM    Final     Orson Eva, DO  [facility]  If 7PM-7AM, please contact night-coverage [email or web address] Password [email or web address] 07/22/2020, 3:48 PM   LOS: 1 day

## 2020-07-22 NOTE — Progress Notes (Signed)
Inpatient Diabetes Program Recommendations  AACE/ADA: New Consensus Statement on Inpatient Glycemic Control (2015)  Target Ranges:  Prepandial:   less than 140 mg/dL      Peak postprandial:   less than 180 mg/dL (1-2 hours)      Critically ill patients:  140 - 180 mg/dL   Lab Results  Component Value Date   GLUCAP 298 (H) 07/21/2020   HGBA1C 9.2 (H) 05/19/2020    Review of Glycemic Control Results for Allen Yu, Allen Yu (MRN [id number]) as of 07/22/2020 10:18  Ref. Range 07/21/2020 20:49  Glucose-Capillary Latest Ref Range: 70 - 99 mg/dL 298 (H)   Diabetes history: DM 2 Outpatient Diabetes medications:  Lantus 10 units q HS, Humalog (not taking?) Current orders for Inpatient glycemic control:  Lantus 10 units daily Inpatient Diabetes Program Recommendations:   Please add Novolog sensitive correction q 4 hours.    Thanks,  Adah Perl, RN, BC-ADM Inpatient Diabetes Coordinator Pager [phone]  (8a-5p)

## 2020-07-22 NOTE — Consult Note (Signed)
Consultation Note Date: 07/22/2020   Patient Name: Allen Yu  DOB: 1945-09-22  MRN: [id number]  Age / Sex: 75 y.o., male  PCP: [name], MD Referring Physician: Orson Eva, MD  Reason for Consultation: Establishing goals of care  HPI/Patient Profile: 75 y.o. 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   Clinical Assessment and Goals of Care: I have reviewed medical records including EPIC notes, labs and imaging, received report from RN, assessed the patient.  Mr. Decoteau is lying quietly in bed on his side.  He will briefly make but not keep eye contact.  He is not interacting with staff, and does not answer my orientation questions.  I am not sure that he can make his basic needs known.  There is no family at bedside at this time.    Call to wife, [name] to discuss diagnosis prognosis, [place], EOL wishes, disposition and options.  No answer, unable to leave voicemail message. PMT will continue to support holistically.   HCPOA   NEXT OF KIN -wife, [name]    SUMMARY OF RECOMMENDATIONS   At this point continue full scope/full code by default. Continued goals of care discussions with patient/family PMT to follow-up 7/5  Code Status/Advance Care Planning: Full code -by default, patient demented, unable to reach wife for [place] discussions.  Symptom Management:  Per hospitalist, no additional needs at this time.  Palliative Prophylaxis:  Frequent Pain Assessment and Turn Reposition  Additional Recommendations (Limitations, Scope, Preferences): Full Scope Treatment  Psycho-social/Spiritual:  Desire for further Chaplaincy support:no Additional Recommendations: Caregiving  Support/Resources and Education on Hospice  Prognosis:  Unable to determine, based on  outcomes.  6 months or less would not be surprising based on frailty, poor functional status, weight loss.  Discharge Planning:  To be determined, based on outcomes.  Unsure of ability to rehab.       Primary Diagnoses: Present on Admission:  SIRS (systemic inflammatory response syndrome) ([place])   I have reviewed the medical record, interviewed the patient and family, and examined the patient. The following aspects are pertinent.  Past Medical History:  Diagnosis Date   Anemia    Arthritis    Carotid stenosis    Chronic back pain    Chronic kidney disease    Constipation    Dementia (HCC)    Diabetes mellitus    GERD (gastroesophageal reflux disease)    History of kidney stones    Hypertension    Lung nodule    PAF (paroxysmal atrial fibrillation) (HCC)    Peripheral neuropathy    Peripheral vascular disease (HCC)    PTSD (post-traumatic stress disorder)    Schizophrenia ([place])    Tuberculosis    Treated   Social History   Socioeconomic History   Marital status: Married    Spouse name: [name]   Number of children: Not on file   Years of education: Not on  file   Highest education level: Not on file  Occupational History   Not on file  Tobacco Use   Smoking status: Every Day    Packs/day: 0.50    Pack years: 0.00    Types: Cigarettes   Smokeless tobacco: Never   Tobacco comments:    burns them up  Vaping Use   Vaping Use: Never used  Substance and Sexual Activity   Alcohol use: No    Alcohol/week: 0.0 standard drinks   Drug use: Yes    Types: Marijuana    Comment: almost daily for pain   Sexual activity: Never  Other Topics Concern   Not on file  Social History Narrative   Not on file   Social Determinants of Health   Financial Resource Strain: Not on file  Food Insecurity: No Food Insecurity   Worried About Running Out of Food in the Last Year: Never true   Ran Out of Food in the Last Year: Never true  Transportation Needs: No Transportation Needs    Lack of Transportation (Medical): No   Lack of Transportation (Non-Medical): No  Physical Activity: Not on file  Stress: No Stress Concern Present   Feeling of Stress : Only a little  Social Connections: Not on file   Family History  Problem Relation Age of Onset   Heart disease Mother        before age 41   Scheduled Meds:  aspirin EC  81 mg Oral Daily   atorvastatin  40 mg Oral QHS   Chlorhexidine Gluconate Cloth  6 each Topical Daily   donepezil  10 mg Oral QHS   enoxaparin (LOVENOX) injection  60 mg Subcutaneous Q24H   feeding supplement (GLUCERNA SHAKE)  237 mL Oral TID BM   gabapentin  100 mg Oral QHS   insulin glargine  10 Units Subcutaneous Daily   metoprolol tartrate  25 mg Oral BID   multivitamin with minerals  1 tablet Oral Daily   Continuous Infusions:  amiodarone 30 mg/hr (07/22/20 1503)   cefTRIAXone (ROCEPHIN)  IV Stopped (07/22/20 1217)   lactated ringers 125 mL/hr at 07/22/20 1503   PRN Meds:.acetaminophen **OR** acetaminophen, ondansetron **OR** ondansetron (ZOFRAN) IV, traMADol Medications Prior to Admission:  Prior to Admission medications   Medication Sig Start Date End Date Taking? Authorizing Provider  acetaminophen (TYLENOL) 500 MG tablet Take 500 mg by mouth every 6 (six) hours as needed for moderate pain.   Yes [provider]  aspirin EC 81 MG tablet Take 1 tablet (81 mg total) by mouth 2 (two) times daily for 30 days, THEN 1 tablet (81 mg total) daily. 06/30/20 10/28/20 Yes Mercy Riding, MD  atorvastatin (LIPITOR) 40 MG tablet Take 1 tablet (40 mg total) by mouth at bedtime. 05/21/20 08/19/20 Yes [place] Eric J, DO  donepezil (ARICEPT) 10 MG tablet Take 10 mg by mouth at bedtime.  06/28/16  Yes [provider]  gabapentin (NEURONTIN) 100 MG capsule Take 100 mg by mouth at bedtime. 11/06/19  Yes [provider]  LANTUS 100 UNIT/ML injection Inject 0.1 mLs (10 Units total) into the skin at bedtime as needed (High blood glucose).  06/30/20  Yes Mercy Riding, MD  losartan (COZAAR) 50 MG tablet Take 50 mg by mouth at bedtime.   Yes [provider]  metoprolol tartrate (LOPRESSOR) 25 MG tablet Take 1 tablet (25 mg total) by mouth 2 (two) times daily. 06/30/20 06/30/21 Yes Mercy Riding, MD  Multiple Vitamins-Minerals (MULTIVITAMIN  WITH MINERALS) tablet Take 1 tablet by mouth daily. 50 plus   Yes [provider]  nicotine (NICODERM CQ - DOSED IN MG/24 HOURS) 21 mg/24hr patch Place 1 patch (21 mg total) onto the skin daily as needed (Cessation of smoking). 06/30/20  Yes Mercy Riding, MD  oxyCODONE-acetaminophen (PERCOCET) 5-325 MG tablet Take 1 tablet by mouth every 6 (six) hours as needed for severe pain. Do not take with Acetaminophen (Tylenol) 07/01/20 07/01/21 Yes Baglia, Corrina, PA-C  traMADol (ULTRAM) 50 MG tablet Take 50 mg by mouth every 6 (six) hours as needed for moderate pain. 07/18/20  Yes [provider]  B-D UF III MINI PEN NEEDLES 31G X 5 MM MISC SMARTSIG:1 Each SUB-Q Daily 12/23/19   [provider]  feeding supplement (ENSURE ENLIVE / ENSURE PLUS) LIQD Take 237 mLs by mouth 2 (two) times daily between meals. 06/30/20 07/30/20  Mercy Riding, MD  insulin lispro (HUMALOG) 100 UNIT/ML injection Inject 5-10 Units into the skin daily as needed for high blood sugar.  Patient not taking: Reported on 07/21/2020    [provider]  oxyCODONE-acetaminophen (PERCOCET/ROXICET) 5-325 MG tablet Take 1 tablet by mouth every 4 (four) hours as needed for severe pain. Patient not taking: Reported on 07/21/2020 06/17/20   Waynetta Sandy, MD   Allergies  Allergen Reactions   Bee Venom Anaphylaxis   Codeine Other (See Comments)    incoherent    Diovan [Valsartan] Other (See Comments)    incoherent   Propoxyphene Other (See Comments)    Dizziness, "Makes me feel drunk"   Review of Systems  Unable to perform ROS: Dementia   Physical Exam Constitutional:      General: He is not in acute  distress.    Appearance: He is ill-appearing.  HENT:     Head:     Comments: Temporal wasting    Mouth/Throat:     Mouth: Mucous membranes are moist.  Cardiovascular:     Rate and Rhythm: Normal rate.  Pulmonary:     Effort: Pulmonary effort is normal. No respiratory distress.  Musculoskeletal:     Comments: Frail and thin, cachectic, left AKA  Neurological:     Comments: Known dementia  Psychiatric:     Comments: Somewhat fearful, wants to be left alone    Vital Signs: BP (!) 117/59   Pulse 91   Temp 98.1 F (36.7 C) (Oral)   Resp (!) 22   Ht 6\' 1"  (1.854 m)   Wt 57.6 kg   SpO2 99%   BMI 16.76 kg/m  Pain Scale: CPOT   Pain Score: 4    SpO2: SpO2: 99 % O2 Device:SpO2: 99 % O2 Flow Rate: .   IO: Intake/output summary:  Intake/Output Summary (Last 24 hours) at 07/22/2020 1553 Last data filed at 07/22/2020 1503 Gross per 24 hour  Intake 3204.54 ml  Output 850 ml  Net 2354.54 ml    LBM: Last BM Date: 07/22/20 Baseline Weight: Weight: 57.6 kg Most recent weight: Weight: 57.6 kg     Palliative Assessment/Data:   Flowsheet Rows    Flowsheet Row Most Recent Value  Intake Tab   Referral Department Hospitalist  Unit at Time of Referral Intermediate Care Unit  Palliative Care Primary Diagnosis Sepsis/Infectious Disease  Date Notified 07/22/20  Palliative Care Type New Palliative care  Reason for referral Clarify Goals of Care  Date of Admission 07/21/20  Date first seen by Palliative Care 07/22/20  # of days Palliative referral  response time 0 Day(s)  # of days IP prior to Palliative referral 1  Clinical Assessment   Palliative Performance Scale Score 20%  Pain Max last 24 hours Not able to report  Pain Min Last 24 hours Not able to report  Dyspnea Max Last 24 Hours Not able to report  Dyspnea Min Last 24 hours Not able to report  Psychosocial & Spiritual Assessment   Palliative Care Outcomes        Time In: 1500 Time Out: 1530 Time Total: 30 minutes   Greater than 50%  of this time was spent counseling and coordinating care related to the above assessment and plan.  Signed by: Drue Novel, NP   Please contact Palliative Medicine Team phone at [phone] for questions and concerns.  For individual provider: See Shea Evans

## 2020-07-22 NOTE — Progress Notes (Signed)
Patient refused all PO medicines.

## 2020-07-22 NOTE — Consult Note (Signed)
Cardiology Consultation:   Patient ID: Allen Yu; [id number]; January 06, 1946   Admit date: 07/21/2020 Date of Consult: 07/22/2020  Primary Care Provider: [name], MD Primary Cardiologist: None Primary Electrophysiologist: None   Patient Profile:   Allen Yu is a 75 y.o. male with a history of hypertension, PAD, PTSD, schizophrenia, dementia, and type 2 diabetes mellitus who is being seen today for the evaluation of paroxysmal atrial fibrillation at the request of Dr. Carles Collet.  History of Present Illness:   Allen Yu is currently admitted to the hospital with acute metabolic encephalopathy on top of baseline dementia complicated by SIRS, hyponatremia, and acute on chronic renal failure.  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  He has had intermittently documented paroxysmal atrial fibrillation although has not been anticoagulated in light of his acute illness and comorbidities as well as concern for bleeding risk.  Allen Yu does not provide any specific complaints this morning other than indicating that he "does not feel right." He is not oriented to place or situation.  Per chart review he developed rapid atrial fibrillation overnight, was assessed by covering hospitalist and is now on IV amiodarone and maintaining sinus rhythm at the present time.  Nursing tells me that the patient did refuse his oral Lopressor yesterday.  Past Medical History:  Diagnosis Date   Anemia    Arthritis    Carotid stenosis    Chronic back pain    Chronic kidney disease    Constipation    Dementia (HCC)    Diabetes mellitus    GERD (gastroesophageal reflux disease)    History of kidney stones    Hypertension    Lung nodule    PAF (paroxysmal atrial fibrillation) (HCC)    Peripheral neuropathy    Peripheral vascular disease (HCC)    PTSD (post-traumatic stress disorder)    Schizophrenia ([place])    Tuberculosis     Treated    Past Surgical History:  Procedure Laterality Date   ABDOMINAL AORTOGRAM W/LOWER EXTREMITY N/A 05/20/2020   Procedure: ABDOMINAL AORTOGRAM W/LOWER EXTREMITY;  Surgeon: Elam Dutch, MD;  Location: [facility];  Service: Cardiovascular;  Laterality: N/A;   AMPUTATION Left 06/28/2020   Procedure: AMPUTATION ABOVE KNEE LEFT;  Surgeon: Rosetta Posner, MD;  Location: [facility];  Service: Vascular;  Laterality: Left;   [place], 2013   x2   BLADDER SURGERY     02   CHOLECYSTECTOMY     COLONOSCOPY     COLONOSCOPY N/A 12/07/2014   Procedure: COLONOSCOPY;  Surgeon: Daneil Dolin, MD;  [facility];  Service: Endoscopy;  Laterality: N/A;  10:30 Am   EYE SURGERY Bilateral    removed metal from eye   FEMORAL-TIBIAL BYPASS GRAFT Left 05/27/2020   Procedure: LEFT FEMORAL TO PERONEAL ARTERY BYPASS;  Surgeon: Rosetta Posner, MD;  Location: [facility];  Service: Vascular;  Laterality: Left;   HERNIA REPAIR Right    INGUINAL HERNIA REPAIR Left 11/03/2013   Procedure: HERNIA REPAIR INGUINAL ADULT;  Surgeon: Gayland Curry, MD;  Location: [facility];  Service: General;  Laterality: Left;   SHOULDER SURGERY     RIGHT SHOULDER    VIDEO BRONCHOSCOPY WITH ENDOBRONCHIAL NAVIGATION N/A 07/10/2019   Procedure: VIDEO BRONCHOSCOPY WITH ENDOBRONCHIAL NAVIGATION;  Surgeon: Melrose Nakayama, MD;  Location: [facility];  Service: Thoracic;  Laterality: N/A;     Inpatient Medications: Scheduled Meds:  aspirin EC  81 mg Oral Daily   atorvastatin  40 mg Oral QHS   Chlorhexidine Gluconate Cloth  6 each Topical Daily   donepezil  10 mg Oral QHS   enoxaparin (LOVENOX) injection  60 mg Subcutaneous Q24H   gabapentin  100 mg Oral QHS   metoprolol tartrate  25 mg Oral BID   multivitamin with minerals  1 tablet Oral Daily   Continuous Infusions:  amiodarone 30 mg/hr (07/22/20 9379)   ceFEPime (MAXIPIME) IV 2 g (07/21/20 1621)   lactated ringers 125 mL/hr at 07/22/20 0638    [START ON 07/23/2020] vancomycin     PRN Meds: acetaminophen **OR** acetaminophen, ondansetron **OR** ondansetron (ZOFRAN) IV, traMADol  Allergies:    Allergies  Allergen Reactions   Bee Venom Anaphylaxis   Codeine Other (See Comments)    incoherent    Diovan [Valsartan] Other (See Comments)    incoherent   Propoxyphene Other (See Comments)    Dizziness, "Makes me feel drunk"    Social History:   Social History   Tobacco Use   Smoking status: Every Day    Packs/day: 0.50    Pack years: 0.00    Types: Cigarettes   Smokeless tobacco: Never   Tobacco comments:    burns them up  Substance Use Topics   Alcohol use: No    Alcohol/week: 0.0 standard drinks     Family History:   The patient's family history includes Heart disease in his mother.  ROS:  Unable to obtain due to current mental status.  Physical Exam/Data:   Vitals:   07/22/20 0630 07/22/20 0800 07/22/20 0900 07/22/20 0913  BP: 107/65 134/63    Pulse:    73  Resp: (!) 23 (!) 26 18 16   Temp:    97.6 F (36.4 C)  TempSrc:    Oral  SpO2:    (!) 79%  Weight:      Height:        Intake/Output Summary (Last 24 hours) at 07/22/2020 1007 Last data filed at 07/22/2020 0240 Gross per 24 hour  Intake 2480.16 ml  Output --  Net 2480.16 ml   Filed Weights   07/21/20 1000  Weight: 57.6 kg   Body mass index is 16.76 kg/m.   Gen: Chronically ill-appearing and cachectic, no obvious distress. HEENT: Conjunctiva and lids normal, oropharynx clear. Neck: Supple, no elevated JVP or carotid bruits, no thyromegaly. Lungs: Decreased breath sounds without wheezing. Cardiac: RRR with ectopy, no S3 or significant systolic murmur, no pericardial rub. Abdomen: Soft, nontender, bowel sounds present. Extremities: Muscle wasting evident.  Status post left AKA. Skin: Warm and dry. Musculoskeletal: No kyphosis. Neuropsychiatric: Alert and oriented x1.  EKG:  An ECG dated 07/21/2020 was personally reviewed today and  demonstrated:  Sinus rhythm with PACs.  Telemetry:  I personally reviewed telemetry which shows sinus rhythm with PACs and bursts of atrial fibrillation.  Relevant CV Studies:  Echocardiogram 06/29/2020:  1. Left ventricular ejection fraction, by estimation, is 60 to 65%. The  left ventricle has normal function. The left ventricle has no regional  wall motion abnormalities. Left ventricular diastolic parameters are  indeterminate.   2. Right ventricular systolic function is normal. The right ventricular  size is normal. There is normal pulmonary artery systolic pressure. The  estimated right ventricular systolic pressure is 97.3 mmHg.   3. Left atrial size was mildly dilated.   4. Right atrial size was  mildly dilated.   5. The mitral valve is normal in structure. Mild mitral valve  regurgitation. No evidence of mitral stenosis.   6. The aortic valve is normal in structure. Aortic valve regurgitation is  not visualized. Mild to moderate aortic valve sclerosis/calcification is  present, without any evidence of aortic stenosis.   7. The inferior vena cava is normal in size with greater than 50%  respiratory variability, suggesting right atrial pressure of 3 mmHg.   Laboratory Data:  Chemistry Recent Labs  Lab 07/21/20 1055 07/22/20 0438  NA 129* 132*  K 4.3 4.6  CL 94* 96*  CO2 25 24  GLUCOSE 350* 344*  BUN 82* 88*  CREATININE 2.37* 2.54*  CALCIUM 9.4 8.8*  GFRNONAA 28* 26*  ANIONGAP 10 12    Recent Labs  Lab 07/21/20 1055 07/22/20 0438  PROT 6.9 6.0*  ALBUMIN 3.4* 2.7*  AST 17 18  ALT 16 15  ALKPHOS 117 103  BILITOT 1.4* 1.3*   Hematology Recent Labs  Lab 07/21/20 1055 07/22/20 0438  WBC 22.3* 22.7*  RBC 3.13* 2.88*  HGB 9.8* 9.0*  HCT 29.8* 27.3*  MCV 95.2 94.8  MCH 31.3 31.3  MCHC 32.9 33.0  RDW 16.4* 15.9*  PLT 279 178   Cardiac Enzymes Recent Labs  Lab 06/29/20 0509 06/29/20 0654  TROPONINIHS 23* 22*    Radiology/Studies:  CT ABDOMEN PELVIS  WO CONTRAST  Result Date: 07/21/2020 CLINICAL DATA:  74 year old male with acute abdominal pain. EXAM: CT ABDOMEN AND PELVIS WITHOUT CONTRAST TECHNIQUE: Multidetector CT imaging of the abdomen and pelvis was performed following the standard protocol without IV contrast. COMPARISON:  CT abdomen pelvis dated 07/09/2016. FINDINGS: Evaluation of this exam is limited in the absence of intravenous contrast. Evaluation is also limited due to streak artifact caused by patient's arms and loss of intra-abdominal fat and cachexia as well as anasarca. Lower chest: The visualized lung bases are clear. There is coronary vascular calcification. Hepatobiliary: The liver is grossly unremarkable. No intrahepatic biliary dilatation. Cholecystectomy. Pancreas: The pancreas is poorly visualized. Spleen: The spleen is grossly unremarkable. Adrenals/Urinary Tract: Mild bilateral adrenal thickening. There is mild bilateral hydronephrosis. Probable bilateral renal vascular calcification. The urinary bladder is distended. There is diffuse bladder wall emphysema with large amount of intraluminal air. Findings consistent with emphysematous cystitis correlation with clinical exam and urinalysis recommended. There is extension of the air outside of the bladder wall and superior dissection along the fat planes of the anterior peritoneal wall and bilateral flank and pelvic sidewall. Clinical correlation recommended to evaluate for possibility of sepsis and peritonitis. Stomach/Bowel: No evidence of bowel obstruction. Evaluation of the bowel is however is limited due to factors above. Vascular/Lymphatic: Advanced aortoiliac atherosclerotic disease. Evaluation of the vasculature is very limited on this CT. Reproductive: The prostate gland is poorly visualized. There is air within the urethra. Other: There is loss of subcutaneous and intra-abdominal fat and cachexia. Musculoskeletal: Severe osteopenia with degenerative changes of the spine. L4-S1  posterior fusion. No acute osseous pathology. IMPRESSION: 1. Emphysematous cystitis with extension of air along the anterior peritoneal and lateral pelvic sidewalls. Clinical correlation is recommended to evaluate for possibility of sepsis and peritonitis. There is air within the urethra. 2. Mild bilateral hydronephrosis. 3. No bowel obstruction. 4. Aortic Atherosclerosis (ICD10-I70.0) and Emphysema (ICD10-J43.9). These results were called by telephone at the time of interpretation on 07/21/2020 at 8:36 pm to the patient's nurse, Panther, who verbally acknowledged these results. Electronically Signed   By: Milas Hock  Radparvar M.D.   On: 07/21/2020 20:39   DG Chest 1 View  Result Date: 07/21/2020 CLINICAL DATA:  75 year old male with confusion and weakness. Recent UTI. EXAM: CHEST  1 VIEW COMPARISON:  Chest CTA 06/29/2020 and earlier. FINDINGS: Portable AP upright view at 1019 hours. Stable lung volumes and mediastinal contours. Emphysema demonstrated on the prior CTA. The mildly spiculated right upper lobe pulmonary nodule seen by CT is poorly visible with plain radiograph technique. Allowing for portable technique the lungs are clear. Visualized tracheal air column is within normal limits. Prior cervical ACDF. No acute osseous abnormality identified. Negative visible bowel gas pattern. IMPRESSION: 1. The suspicious right upper lobe pulmonary nodule seen by CT 06/29/2020 is occult by portable x-ray. 2. Emphysema.  No new cardiopulmonary abnormality. Electronically Signed   By: Genevie Ann M.D.   On: 07/21/2020 11:01   CT Head Wo Contrast  Result Date: 07/21/2020 CLINICAL DATA:  75 year old male with confusion, agitation, altered mental status. EXAM: CT HEAD WITHOUT CONTRAST TECHNIQUE: Contiguous axial images were obtained from the base of the skull through the vertex without intravenous contrast. COMPARISON:  Brain MRI 08/27/2018.  Head CT 07/08/2015. FINDINGS: Brain: Mild cerebral volume loss since 2017 appears to  be generalized. No midline shift, ventriculomegaly, mass effect, evidence of mass lesion, intracranial hemorrhage or evidence of cortically based acute infarction. Patchy and confluent bilateral cerebral white matter hypodensity has progressed since 2017 but appears stable to the 2020 MRI. Bilateral basal ganglia vascular calcifications. No cortical encephalomalacia. Vascular: Calcified atherosclerosis at the skull base. No suspicious intracranial vascular hyperdensity. Skull: Mild chronic right lamina papyracea fracture. Otherwise negative. No acute osseous abnormality identified. Sinuses/Orbits: Visualized paranasal sinuses and mastoids are stable and well aerated. Other: Visualized orbits and scalp soft tissues are within normal limits. IMPRESSION: 1. No acute intracranial abnormality. 2. Chronic small vessel disease appears stable since a 2020 MRI. Electronically Signed   By: Genevie Ann M.D.   On: 07/21/2020 11:05    Assessment and Plan:   1.  Paroxysmal atrial fibrillation with CHA2DS2-VASc score of 4.  Chronicity is not entirely clear, episodes have been noted during hospitalization over the last month.  He was started on IV amiodarone overnight and is in sinus rhythm this morning.  Also on oral Lopressor and was started on treatment dose Lovenox.  2.  Severe PAD status post left AKA in early June, followed by Dr. Donnetta Hutching.  He had previously undergone left femoral to above-knee popliteal bypass in May.  3.  Acute renal failure with creatinine up to 2.54 from 0.97 as of June 8.  4.  Klebsiella UTI.  Also appears to have hematuria based on urine color in Foley bag.  5.  Acute metabolic encephalopathy on baseline dementia.  6.  Presentation with SIRS.  For now would agree with temporary use of IV amiodarone to settle down heart rhythm as he is treated for acute illness.  Encourage continued oral intake of Lopressor as well.  It is not clear that he is a good candidate for long-term anticoagulation  however, may even need to reduce or discontinue Lovenox if he does have active hematuria.  Continue supportive measures.  Signed, Rozann Lesches, MD  07/22/2020 10:07 AM

## 2020-07-23 NOTE — Progress Notes (Addendum)
PROGRESS NOTE  NIVIN BRANIFF VOH:[id number] DOB: November 13, 1945 DOA: 07/21/2020 PCP: [name], MD  Brief History:   75 y.o. 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  The patient is unable to give any significant history secondary to his altered mental status.  All of this history is obtained from review of medical record and speaking with the patient spouse.  Accordingly, the patient was recently discharged from the hospital after a stay from 06/28/2020 to 06/30/2020 when he underwent a left AKA on 06/28/2020 secondary to a poorly healing left heel ulceration.  His hospitalization was complicated by AKI as well as acute blood loss anemia.  He also had a self-limited episode of atrial fibrillation.  Anticoagulation was deferred secondary to his high fall risk and low A. fib burden.  Nevertheless, the patient's spouse states that he has had apathy and has been less communicative over the past week.  She states that he has quit taking all his medications which at baseline he normally is able to dispense to himself.  She has noted that he has been less talkative and more agitated.   She was concerned about a possible UTI and contacted the patient's PCP.  Apparently a urine sample was sent to [facility] on 07/19/2020.  Results are unknown at this time.  Patient was not started on any antibiotics.  However, the patient was prescribed tramadol on 07/18/2020 by his PCP.  He quit taking his Percocet that was prescribed at the time of discharge from the hospital from his last admission because it was causing constipation.  There is been no other new medications. In the emergency department, the patient was afebrile and hemodynamically stable.  He was tachycardic initially in the 140s.  Oxygen saturation was  100% on room air.  BMP shows sodium 129, potassium 4.3, serum creatinine 2.37.  LFTs were unremarkable.  WBC 22.3, hemoglobin 9.8, platelets 279,000.  Lactic acid 1.6.  CT of the brain was negative for acute findings.  Chest x-ray showed hyperinflation.  The patient was admitted secondary to altered mental status and AKI with SIRS criteria.   Since admission, the patient was empirically started on vanco and cefepime after blood and urine cultures were obtained.  Blood cultures grew Klebsiella pneumoniae.  He was also noted to have 06/29/20 urine culture with Klebsiella pneumonia.  His antibiotics were narrowed to ceftriaxone.  CT of the abdomen showed diffuse urinary bladder wall emphysema with intraluminal air  and extension of air outside bladder wall with superior dissection along anterior peritoneal wall and pelvic side wall.   Assessment/Plan: Sepsis -Patient presented with tachycardia and leukocytosis -Blood cultures= Klebsiella pneumoniae -urine culture = pending -Chest x-ray negative for infiltrates -Started empiric vancomycin and cefepime>>>narrowed to ceftriaxone -PCT 3.72   Emphysematous Cystitis -urine culture pending, but likely to be Klebsiella -CT abd as discussed above   Klebsiella Bacteremia -source is urine -continue ceftriaxone   Paroxysmal Atrial Fibrillation with RVR -developed evening 6/30 -continue amiodarone for now>>sinus -CHADS-VASc= 4, but not clear if he is good AC candidate long term -06/29/20 Echo--EF 60-65%, no WMA, normal PASP   Acute metabolic encephalopathy -Secondary to AKI and sepsis -remains confused and apathetic -I94--854 -OEV--0.350 -Folic KXFG--1.8   Acute on chronic renal failure--CKD stage IIIa -Baseline creatinine 0.9-1.2 -Presented with serum creatinine  2.37 -Secondary to volume depletion and sepsis -Continue IV fluids   Hydronephrosis -due to emphysematous cystitis/Pyelonephritis -monitior clinically   Hyponatremia -Secondary to  volume depletion and poor solute intake -Start IV fluids<<improving   Uncontrolled diabetes mellitus type 2 with hyperglycemia -05/19/2020 hemoglobin A1c 9.2 -NovoLog sliding scale -lantus 10 units daily   Essential hypertension -Holding losartan secondary to AKI -Restart metoprolol>>convert to IV as patient is refusing po meds   Hyperlipidemia -Restart statin   Dementia -Restart Aricept         Status is: Inpatient   Remains inpatient appropriate because:IV treatments appropriate due to intensity of illness or inability to take PO   Dispo: The patient is from: Home              Anticipated d/c is to: Home              Patient currently is not medically stable to d/c.              Difficult to place patient No               Family Communication:   sister updated 7/2   Consultants:  cardiology   Code Status:  FULL   DVT Prophylaxis: [place] Lovenox     Procedures: As Listed in Progress Note Above   Antibiotics: Ceftriaxone 7/1>> -vanc/cefepime 6/30>>7/1         Subjective: Complains of right leg pain at stump.  Patient denies fevers, chills, headache, chest pain, dyspnea, nausea, vomiting, diarrhea, abdominal pain, dysuria,   Objective: Vitals:   07/23/20 1500 07/23/20 1530 07/23/20 1600 07/23/20 1603  BP: (!) 139/59 (!) 158/61 138/64   Pulse: 73 76 75 78  Resp: 18 15 18 17   Temp:    (!) 97.3 F (36.3 C)  TempSrc:    Oral  SpO2: 100% 100% 100% 100%  Weight:      Height:        Intake/Output Summary (Last 24 hours) at 07/23/2020 1704 Last data filed at 07/23/2020 1525 Gross per 24 hour  Intake 4608.4 ml  Output 1000 ml  Net 3608.4 ml   Weight change: -13.1 kg Exam:  General:  Pt is alert, follows commands appropriately, not in acute distress HEENT: No icterus, No thrush, No neck mass, [place]/AT Cardiovascular: RRR, S1/S2, no rubs, no gallops Respiratory: bibasilar rales. No wheeze Abdomen: Soft/+BS, non tender, non distended, no  guarding Extremities: No edema, No lymphangitis, No petechiae, No rashes, no synovitis   Data Reviewed: I have personally reviewed following labs and imaging studies Basic Metabolic Panel: Recent Labs  Lab 07/21/20 1055 07/22/20 0438  NA 129* 132*  K 4.3 4.6  CL 94* 96*  CO2 25 24  GLUCOSE 350* 344*  BUN 82* 88*  CREATININE 2.37* 2.54*  CALCIUM 9.4 8.8*   Liver Function Tests: Recent Labs  Lab 07/21/20 1055 07/22/20 0438  AST 17 18  ALT 16 15  ALKPHOS 117 103  BILITOT 1.4* 1.3*  PROT 6.9 6.0*  ALBUMIN 3.4* 2.7*   Recent Labs  Lab 07/21/20 1723  LIPASE 19   No results for input(s): AMMONIA in the last 168 hours. Coagulation Profile: No results for input(s): INR, PROTIME in the last 168 hours. CBC: Recent Labs  Lab 07/21/20 1055 07/22/20 0438  WBC 22.3* 22.7*  NEUTROABS 19.8*  --   HGB 9.8* 9.0*  HCT 29.8* 27.3*  MCV 95.2 94.8  PLT 279 178   Cardiac Enzymes: No results for input(s): CKTOTAL, CKMB,  CKMBINDEX, TROPONINI in the last 168 hours. BNP: Invalid input(s): POCBNP CBG: Recent Labs  Lab 07/21/20 2049  GLUCAP 298*   HbA1C: No results for input(s): HGBA1C in the last 72 hours. Urine analysis:    Component Value Date/Time   COLORURINE RED (A) 07/22/2020 1350   APPEARANCEUR CLOUDY (A) 07/22/2020 1350   LABSPEC 1.013 07/22/2020 1350   PHURINE 7.0 07/22/2020 1350   GLUCOSEU 50 (A) 07/22/2020 1350   HGBUR LARGE (A) 07/22/2020 1350   BILIRUBINUR NEGATIVE 07/22/2020 1350   KETONESUR 5 (A) 07/22/2020 1350   PROTEINUR 100 (A) 07/22/2020 1350   UROBILINOGEN 0.2 02/18/2014 0950   NITRITE NEGATIVE 07/22/2020 1350   LEUKOCYTESUR MODERATE (A) 07/22/2020 1350   Sepsis Labs: @LABRCNTIP (procalcitonin:4,lacticidven:4) ) Recent Results (from the past 240 hour(s))  Culture, blood (routine x 2)     Status: Abnormal (Preliminary result)   Collection Time: 07/21/20 12:43 PM   Specimen: BLOOD  Result Value Ref Range Status   Specimen Description    Final    BLOOD LEFT ARM Performed at [facility], [address] [place], [place] [address]    Special Requests   Final    BOTTLES DRAWN AEROBIC AND ANAEROBIC Blood Culture adequate volume Performed at [facility], [address], [place], [place] [address]    Culture  Setup Time   Final    IN BOTH AEROBIC AND ANAEROBIC BOTTLES GRAM NEGATIVE RODS Gram Stain Report Called to,Read Back By and Verified With: PEACH,RN@0222  07/22/20 MKELLY CRITICAL RESULT CALLED TO, READ BACK BY AND VERIFIED WITH: S,HALL @1038  07/22/20 DV    Culture (A)  Final    KLEBSIELLA PNEUMONIAE SUSCEPTIBILITIES TO FOLLOW Performed at [facility], [place] [address] [place], [place] [address]    Report Status PENDING  Incomplete  Culture, blood (routine x 2)     Status: Abnormal (Preliminary result)   Collection Time: 07/21/20 12:43 PM   Specimen: BLOOD  Result Value Ref Range Status   Specimen Description   Final    BLOOD LEFT HAND Performed at [facility], [address] [place], [place] [address]    Special Requests   Final    BOTTLES DRAWN AEROBIC ONLY Blood Culture results may not be optimal due to an inadequate volume of blood received in culture bottles Performed at [facility], [address] [place], [place] [address]    Culture  Setup Time   Final    AEROBIC BOTTLE ONLY GRAM NEGATIVE RODS Gram Stain Report Called to,Read Back By and Verified With: PEACH,RN@0223  07/22/20 MKELLY Performed at [facility], [address], [place], [place] [address]    Culture KLEBSIELLA PNEUMONIAE (A)  Final   Report Status PENDING  Incomplete  Blood Culture ID Panel (Reflexed)     Status: Abnormal   Collection Time: 07/21/20 12:43 PM  Result Value Ref Range Status   Enterococcus faecalis NOT DETECTED NOT DETECTED Final   Enterococcus Faecium NOT DETECTED NOT DETECTED Final   Listeria monocytogenes NOT DETECTED NOT DETECTED Final   Staphylococcus species NOT DETECTED NOT DETECTED Final   Staphylococcus  aureus (BCID) NOT DETECTED NOT DETECTED Final   Staphylococcus epidermidis NOT DETECTED NOT DETECTED Final   Staphylococcus lugdunensis NOT DETECTED NOT DETECTED Final   Streptococcus species NOT DETECTED NOT DETECTED Final   Streptococcus agalactiae NOT DETECTED NOT DETECTED Final   Streptococcus pneumoniae NOT DETECTED NOT DETECTED Final   Streptococcus pyogenes NOT DETECTED NOT DETECTED Final   A.calcoaceticus-baumannii NOT DETECTED NOT DETECTED Final  Bacteroides fragilis NOT DETECTED NOT DETECTED Final   Enterobacterales DETECTED (A) NOT DETECTED Final    Comment: Enterobacterales represent a large order of gram negative bacteria, not a single organism. CRITICAL RESULT CALLED TO, READ BACK BY AND VERIFIED WITH: S. HALL PHARMD, AT 1038 07/22/20 D. VANHOOK    Enterobacter cloacae complex NOT DETECTED NOT DETECTED Final   Escherichia coli NOT DETECTED NOT DETECTED Final   Klebsiella aerogenes NOT DETECTED NOT DETECTED Final   Klebsiella oxytoca NOT DETECTED NOT DETECTED Final   Klebsiella pneumoniae DETECTED (A) NOT DETECTED Final    Comment: CRITICAL RESULT CALLED TO, READ BACK BY AND VERIFIED WITH: S. HALL PHARMD, AT 1038 07/22/20 D. VANHOOK    Proteus species NOT DETECTED NOT DETECTED Final   Salmonella species NOT DETECTED NOT DETECTED Final   Serratia marcescens NOT DETECTED NOT DETECTED Final   Haemophilus influenzae NOT DETECTED NOT DETECTED Final   Neisseria meningitidis NOT DETECTED NOT DETECTED Final   Pseudomonas aeruginosa NOT DETECTED NOT DETECTED Final   Stenotrophomonas maltophilia NOT DETECTED NOT DETECTED Final   Candida albicans NOT DETECTED NOT DETECTED Final   Candida auris NOT DETECTED NOT DETECTED Final   Candida glabrata NOT DETECTED NOT DETECTED Final   Candida krusei NOT DETECTED NOT DETECTED Final   Candida parapsilosis NOT DETECTED NOT DETECTED Final   Candida tropicalis NOT DETECTED NOT DETECTED Final   Cryptococcus neoformans/gattii NOT DETECTED NOT  DETECTED Final   CTX-M ESBL NOT DETECTED NOT DETECTED Final   Carbapenem resistance IMP NOT DETECTED NOT DETECTED Final   Carbapenem resistance KPC NOT DETECTED NOT DETECTED Final   Carbapenem resistance NDM NOT DETECTED NOT DETECTED Final   Carbapenem resist OXA 48 LIKE NOT DETECTED NOT DETECTED Final   Carbapenem resistance VIM NOT DETECTED NOT DETECTED Final    Comment: Performed at [facility], [address], [place], [place] [address]  MRSA Next Gen by PCR, Nasal     Status: None   Collection Time: 07/21/20 11:56 PM   Specimen: Nasal Mucosa; Nasal Swab  Result Value Ref Range Status   MRSA by PCR Next Gen NOT DETECTED NOT DETECTED Final    Comment: (NOTE) The GeneXpert MRSA Assay (FDA approved for NASAL specimens only), is one component of a comprehensive MRSA colonization surveillance program. It is not intended to diagnose MRSA infection nor to guide or monitor treatment for MRSA infections. Test performance is not FDA approved in patients less than 23 years old. Performed at [facility], [address] [place], [place] [address]      Scheduled Meds:  atorvastatin  40 mg Oral QHS   Chlorhexidine Gluconate Cloth  6 each Topical Daily   donepezil  10 mg Oral QHS   enoxaparin (LOVENOX) injection  60 mg Subcutaneous Q24H   feeding supplement (GLUCERNA SHAKE)  237 mL Oral TID BM   insulin glargine  10 Units Subcutaneous Daily   metoprolol tartrate  2.5 mg Intravenous Q6H   multivitamin with minerals  1 tablet Oral Daily   Continuous Infusions:  sodium chloride 125 mL/hr at 07/23/20 1525   amiodarone 30 mg/hr (07/23/20 1525)   cefTRIAXone (ROCEPHIN)  IV Stopped (07/23/20 1140)    Procedures/Studies: CT ABDOMEN PELVIS WO CONTRAST  Result Date: 07/21/2020 CLINICAL DATA:  75 year old male with acute abdominal pain. EXAM: CT ABDOMEN AND PELVIS WITHOUT CONTRAST TECHNIQUE: Multidetector CT imaging of the abdomen and pelvis was performed following the standard protocol  without IV contrast. COMPARISON:  CT abdomen pelvis dated  07/09/2016. FINDINGS: Evaluation of this exam is limited in the absence of intravenous contrast. Evaluation is also limited due to streak artifact caused by patient's arms and loss of intra-abdominal fat and cachexia as well as anasarca. Lower chest: The visualized lung bases are clear. There is coronary vascular calcification. Hepatobiliary: The liver is grossly unremarkable. No intrahepatic biliary dilatation. Cholecystectomy. Pancreas: The pancreas is poorly visualized. Spleen: The spleen is grossly unremarkable. Adrenals/Urinary Tract: Mild bilateral adrenal thickening. There is mild bilateral hydronephrosis. Probable bilateral renal vascular calcification. The urinary bladder is distended. There is diffuse bladder wall emphysema with large amount of intraluminal air. Findings consistent with emphysematous cystitis correlation with clinical exam and urinalysis recommended. There is extension of the air outside of the bladder wall and superior dissection along the fat planes of the anterior peritoneal wall and bilateral flank and pelvic sidewall. Clinical correlation recommended to evaluate for possibility of sepsis and peritonitis. Stomach/Bowel: No evidence of bowel obstruction. Evaluation of the bowel is however is limited due to factors above. Vascular/Lymphatic: Advanced aortoiliac atherosclerotic disease. Evaluation of the vasculature is very limited on this CT. Reproductive: The prostate gland is poorly visualized. There is air within the urethra. Other: There is loss of subcutaneous and intra-abdominal fat and cachexia. Musculoskeletal: Severe osteopenia with degenerative changes of the spine. L4-S1 posterior fusion. No acute osseous pathology. IMPRESSION: 1. Emphysematous cystitis with extension of air along the anterior peritoneal and lateral pelvic sidewalls. Clinical correlation is recommended to evaluate for possibility of sepsis and  peritonitis. There is air within the urethra. 2. Mild bilateral hydronephrosis. 3. No bowel obstruction. 4. Aortic Atherosclerosis (ICD10-I70.0) and Emphysema (ICD10-J43.9). These results were called by telephone at the time of interpretation on 07/21/2020 at 8:36 pm to the patient's nurse, Panther, who verbally acknowledged these results. Electronically Signed   By: Anner Crete M.D.   On: 07/21/2020 20:39   DG Chest 1 View  Result Date: 07/21/2020 CLINICAL DATA:  75 year old male with confusion and weakness. Recent UTI. EXAM: CHEST  1 VIEW COMPARISON:  Chest CTA 06/29/2020 and earlier. FINDINGS: Portable AP upright view at 1019 hours. Stable lung volumes and mediastinal contours. Emphysema demonstrated on the prior CTA. The mildly spiculated right upper lobe pulmonary nodule seen by CT is poorly visible with plain radiograph technique. Allowing for portable technique the lungs are clear. Visualized tracheal air column is within normal limits. Prior cervical ACDF. No acute osseous abnormality identified. Negative visible bowel gas pattern. IMPRESSION: 1. The suspicious right upper lobe pulmonary nodule seen by CT 06/29/2020 is occult by portable x-ray. 2. Emphysema.  No new cardiopulmonary abnormality. Electronically Signed   By: Genevie Ann M.D.   On: 07/21/2020 11:01   CT Head Wo Contrast  Result Date: 07/21/2020 CLINICAL DATA:  75 year old male with confusion, agitation, altered mental status. EXAM: CT HEAD WITHOUT CONTRAST TECHNIQUE: Contiguous axial images were obtained from the base of the skull through the vertex without intravenous contrast. COMPARISON:  Brain MRI 08/27/2018.  Head CT 07/08/2015. FINDINGS: Brain: Mild cerebral volume loss since 2017 appears to be generalized. No midline shift, ventriculomegaly, mass effect, evidence of mass lesion, intracranial hemorrhage or evidence of cortically based acute infarction. Patchy and confluent bilateral cerebral white matter hypodensity has progressed  since 2017 but appears stable to the 2020 MRI. Bilateral basal ganglia vascular calcifications. No cortical encephalomalacia. Vascular: Calcified atherosclerosis at the skull base. No suspicious intracranial vascular hyperdensity. Skull: Mild chronic right lamina papyracea fracture. Otherwise negative. No acute osseous abnormality identified. Sinuses/Orbits: Visualized  paranasal sinuses and mastoids are stable and well aerated. Other: Visualized orbits and scalp soft tissues are within normal limits. IMPRESSION: 1. No acute intracranial abnormality. 2. Chronic small vessel disease appears stable since a 2020 MRI. Electronically Signed   By: Genevie Ann M.D.   On: 07/21/2020 11:05   CT Angio Chest Pulmonary Embolism (PE) W or WO Contrast  Result Date: 06/29/2020 CLINICAL DATA:  Rule out pulmonary embolus.  Elevated D-dimer EXAM: CT ANGIOGRAPHY CHEST WITH CONTRAST TECHNIQUE: Multidetector CT imaging of the chest was performed using the standard protocol during bolus administration of intravenous contrast. Multiplanar CT image reconstructions and MIPs were obtained to evaluate the vascular anatomy. CONTRAST:  76mL OMNIPAQUE IOHEXOL 350 MG/ML SOLN COMPARISON:  CT chest 06/30/2019 FINDINGS: Cardiovascular: Satisfactory opacification of the pulmonary arteries to the segmental level. No evidence of pulmonary embolism. Normal heart size. Aortic atherosclerosis. Coronary artery calcifications. No pericardial effusion. Mediastinum/Nodes: No enlarged mediastinal, hilar, or axillary lymph nodes. Thyroid gland, trachea, and esophagus demonstrate no significant findings. Lungs/Pleura: Moderate to advanced changes of centrilobular and paraseptal emphysema. Within the right upper lobe there is a solid-appearing nodule which measures 1.6 x 1.4 by 1.4 cm, image 32/3 Focal area of subpleural consolidation is noted within the posteromedial left base, image 74/7. Small left pleural effusion is present. Upper Abdomen: No acute  abnormality. Musculoskeletal: No chest wall abnormality. No acute or significant osseous findings. Review of the MIP images confirms the above findings. IMPRESSION: 1. No evidence for acute pulmonary embolus. 2. Right upper lobe pulmonary nodule is identified measuring up to 1.6 cm. Suspicious for primary bronchogenic carcinoma. Further evaluation with PET-CT as warranted. 3. Subpleural consolidation in the posterior left base with small left pleural effusion. Favor inflammatory or infectious etiology. A 3 month, short-term interval follow-up CT of the chest is recommended to ensure resolution. This could also be readdressed at PET-CT. 4. Aortic Atherosclerosis (ICD10-I70.0) and Emphysema (ICD10-J43.9). 5. Coronary artery calcifications. Electronically Signed   By: Kerby Moors M.D.   On: 06/29/2020 09:59   ECHOCARDIOGRAM COMPLETE  Result Date: 06/29/2020    ECHOCARDIOGRAM REPORT   Patient Name:   SADAT SLIWA Date of Exam: 06/29/2020 Medical Rec #:  [id number]      Height:       73.0 in Accession #:    [id number]     Weight:       127.2 lb Date of Birth:  1945/04/21     BSA:          1.775 m Patient Age:    28 years       BP:           159/83 mmHg Patient Gender: M              HR:           95 bpm. Exam Location:  Inpatient Procedure: 2D Echo, Cardiac Doppler and Color Doppler Indications:    I48.91* Unspeicified atrial fibrillation  History:        Patient has prior history of Echocardiogram examinations, most                 recent 07/10/2015. PAD. One day post BKA.  Sonographer:    Merrie Roof RDCS Referring Phys: [place]  1. Left ventricular ejection fraction, by estimation, is 60 to 65%. The left ventricle has normal function. The left ventricle has no regional wall motion abnormalities. Left ventricular diastolic parameters are indeterminate.  2. Right ventricular systolic  function is normal. The right ventricular size is normal. There is normal pulmonary artery systolic  pressure. The estimated right ventricular systolic pressure is 36.6 mmHg.  3. Left atrial size was mildly dilated.  4. Right atrial size was mildly dilated.  5. The mitral valve is normal in structure. Mild mitral valve regurgitation. No evidence of mitral stenosis.  6. The aortic valve is normal in structure. Aortic valve regurgitation is not visualized. Mild to moderate aortic valve sclerosis/calcification is present, without any evidence of aortic stenosis.  7. The inferior vena cava is normal in size with greater than 50% respiratory variability, suggesting right atrial pressure of 3 mmHg. FINDINGS  Left Ventricle: Left ventricular ejection fraction, by estimation, is 60 to 65%. The left ventricle has normal function. The left ventricle has no regional wall motion abnormalities. The left ventricular internal cavity size was normal in size. There is  no left ventricular hypertrophy. Left ventricular diastolic parameters are indeterminate. Right Ventricle: The right ventricular size is normal. No increase in right ventricular wall thickness. Right ventricular systolic function is normal. There is normal pulmonary artery systolic pressure. The tricuspid regurgitant velocity is 2.66 m/s, and  with an assumed right atrial pressure of 3 mmHg, the estimated right ventricular systolic pressure is 44.0 mmHg. Left Atrium: Left atrial size was mildly dilated. Right Atrium: Right atrial size was mildly dilated. Pericardium: There is no evidence of pericardial effusion. Mitral Valve: The mitral valve is normal in structure. Mild mitral valve regurgitation. No evidence of mitral valve stenosis. Tricuspid Valve: The tricuspid valve is normal in structure. Tricuspid valve regurgitation is mild . No evidence of tricuspid stenosis. Aortic Valve: The aortic valve is normal in structure. Aortic valve regurgitation is not visualized. Mild to moderate aortic valve sclerosis/calcification is present, without any evidence of aortic  stenosis. Aortic valve mean gradient measures 4.0 mmHg. Aortic valve peak gradient measures 7.8 mmHg. Aortic valve area, by VTI measures 2.01 cm. Pulmonic Valve: The pulmonic valve was normal in structure. Pulmonic valve regurgitation is not visualized. No evidence of pulmonic stenosis. Aorta: The aortic root is normal in size and structure. Venous: The inferior vena cava is normal in size with greater than 50% respiratory variability, suggesting right atrial pressure of 3 mmHg. IAS/Shunts: No atrial level shunt detected by color flow Doppler.  LEFT VENTRICLE PLAX 2D LVIDd:         5.10 cm LVIDs:         3.80 cm LV PW:         1.00 cm LV IVS:        1.00 cm LVOT diam:     2.00 cm LV SV:         54 LV SV Index:   30 LVOT Area:     3.14 cm  RIGHT VENTRICLE          IVC RV Basal diam:  3.90 cm  IVC diam: 1.60 cm LEFT ATRIUM             Index       RIGHT ATRIUM           Index LA diam:        3.80 cm 2.14 cm/m  RA Area:     20.00 cm LA Vol (A2C):   69.7 ml 39.26 ml/m RA Volume:   59.60 ml  33.57 ml/m LA Vol (A4C):   69.6 ml 39.21 ml/m LA Biplane Vol: 72.4 ml 40.78 ml/m  AORTIC VALVE AV Area (Vmax):  2.05 cm AV Area (Vmean):   2.12 cm AV Area (VTI):     2.01 cm AV Vmax:           140.00 cm/s AV Vmean:          92.800 cm/s AV VTI:            0.267 m AV Peak Grad:      7.8 mmHg AV Mean Grad:      4.0 mmHg LVOT Vmax:         91.40 cm/s LVOT Vmean:        62.500 cm/s LVOT VTI:          0.171 m LVOT/AV VTI ratio: 0.64  AORTA Ao Root diam: 3.60 cm TRICUSPID VALVE TR Peak grad:   28.3 mmHg TR Vmax:        266.00 cm/s  SHUNTS Systemic VTI:  0.17 m Systemic Diam: 2.00 cm Candee Furbish MD Electronically signed by Candee Furbish MD Signature Date/Time: 06/29/2020/5:23:45 PM    Final     Orson Eva, DO  [facility]  If 7PM-7AM, please contact night-coverage [email or web address] Password [email or web address] 07/23/2020, 5:04 PM   LOS: 2 days

## 2020-07-24 LAB — BASIC METABOLIC PANEL
Anion gap: 10 (ref 5–15)
BUN: 70 mg/dL — ABNORMAL HIGH (ref 8–23)
CO2: 21 mmol/L — ABNORMAL LOW (ref 22–32)
Calcium: 7.7 mg/dL — ABNORMAL LOW (ref 8.9–10.3)
Chloride: 99 mmol/L (ref 98–111)
Creatinine, Ser: 1.85 mg/dL — ABNORMAL HIGH (ref 0.61–1.24)
GFR, Estimated: 38 mL/min — ABNORMAL LOW (ref >60)
Glucose, Bld: 482 mg/dL — ABNORMAL HIGH (ref 70–99)
Potassium: 3 mmol/L — ABNORMAL LOW (ref 3.5–5.1)
Sodium: 130 mmol/L — ABNORMAL LOW (ref 135–145)

## 2020-07-24 LAB — CBC
HCT: 22.1 % — ABNORMAL LOW (ref 39.0–52.0)
Hemoglobin: 7.4 g/dL — ABNORMAL LOW (ref 13.0–17.0)
MCH: 31 pg (ref 26.0–34.0)
MCHC: 33.5 g/dL (ref 30.0–36.0)
MCV: 92.5 fL (ref 80.0–100.0)
Platelets: 109 10*3/uL — ABNORMAL LOW (ref 150–400)
RBC: 2.39 MIL/uL — ABNORMAL LOW (ref 4.22–5.81)
RDW: 15.5 % (ref 11.5–15.5)
WBC: 22.4 10*3/uL — ABNORMAL HIGH (ref 4.0–10.5)
nRBC: 0 % (ref 0.0–0.2)

## 2020-07-24 LAB — HEMOGLOBIN A1C
Hgb A1c MFr Bld: 8.7 % — ABNORMAL HIGH (ref 4.8–5.6)
Mean Plasma Glucose: 202.99 mg/dL

## 2020-07-24 LAB — CULTURE, BLOOD (ROUTINE X 2): Special Requests: ADEQUATE

## 2020-07-24 LAB — GLUCOSE, CAPILLARY
Glucose-Capillary: 434 mg/dL — ABNORMAL HIGH (ref 70–99)
Glucose-Capillary: 333 mg/dL — ABNORMAL HIGH (ref 70–99)
Glucose-Capillary: 203 mg/dL — ABNORMAL HIGH (ref 70–99)
Glucose-Capillary: 143 mg/dL — ABNORMAL HIGH (ref 70–99)
Glucose-Capillary: 143 mg/dL — ABNORMAL HIGH (ref 70–99)

## 2020-07-24 MED ORDER — POTASSIUM CHLORIDE CRYS ER 20 MEQ PO TBCR
40.0000 meq | EXTENDED_RELEASE_TABLET | Freq: Once | ORAL | Status: AC
  Administered 2020-07-24: 40 meq via ORAL
  Filled 2020-07-24: qty 2

## 2020-07-24 MED ORDER — POTASSIUM CHLORIDE IN NACL 20-0.9 MEQ/L-% IV SOLN: INTRAVENOUS | Status: DC

## 2020-07-24 MED ORDER — INSULIN GLARGINE 100 UNIT/ML ~~LOC~~ SOLN
15.0000 [IU] | Freq: Every day | SUBCUTANEOUS | Status: DC
  Administered 2020-07-25 – 2020-07-26 (×2): 15 [IU] via SUBCUTANEOUS
  Filled 2020-07-24 (×3): qty 0.15

## 2020-07-24 MED ORDER — METOPROLOL TARTRATE 25 MG PO TABS
25.0000 mg | ORAL_TABLET | Freq: Two times a day (BID) | ORAL | Status: DC
  Administered 2020-07-24 – 2020-07-26 (×3): 25 mg via ORAL
  Filled 2020-07-24 (×4): qty 1

## 2020-07-24 MED ORDER — INSULIN ASPART 100 UNIT/ML IJ SOLN
20.0000 [IU] | Freq: Once | INTRAMUSCULAR | Status: AC
  Administered 2020-07-24: 20 [IU] via SUBCUTANEOUS

## 2020-07-24 MED ORDER — INSULIN ASPART 100 UNIT/ML IJ SOLN
0.0000 [IU] | Freq: Three times a day (TID) | INTRAMUSCULAR | Status: DC
  Administered 2020-07-24: 11 [IU] via SUBCUTANEOUS
  Administered 2020-07-24 – 2020-07-25 (×2): 5 [IU] via SUBCUTANEOUS
  Administered 2020-07-25 (×2): 8 [IU] via SUBCUTANEOUS
  Administered 2020-07-26: 3 [IU] via SUBCUTANEOUS

## 2020-07-24 MED ORDER — INSULIN ASPART 100 UNIT/ML IJ SOLN: 0.0000 [IU] | Freq: Every day | INTRAMUSCULAR | Status: DC

## 2020-07-24 NOTE — Progress Notes (Signed)
PROGRESS NOTE  Allen Yu XKG:[id number] DOB: 29-Dec-1945 DOA: 07/21/2020 PCP: [name], MD  Brief History:   75 y.o. 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  The patient is unable to give any significant history secondary to his altered mental status.  All of this history is obtained from review of medical record and speaking with the patient spouse.  Accordingly, the patient was recently discharged from the hospital after a stay from 06/28/2020 to 06/30/2020 when he underwent a left AKA on 06/28/2020 secondary to a poorly healing left heel ulceration.  His hospitalization was complicated by AKI as well as acute blood loss anemia.  He also had a self-limited episode of atrial fibrillation.  Anticoagulation was deferred secondary to his high fall risk and low A. fib burden.  Nevertheless, the patient's spouse states that he has had apathy and has been less communicative over the past week.  She states that he has quit taking all his medications which at baseline he normally is able to dispense to himself.  She has noted that he has been less talkative and more agitated.   She was concerned about a possible UTI and contacted the patient's PCP.  Apparently a urine sample was sent to [place] on 07/19/2020.  Results are unknown at this time.  Patient was not started on any antibiotics.  However, the patient was prescribed tramadol on 07/18/2020 by his PCP.  He quit taking his Percocet that was prescribed at the time of discharge from the hospital from his last admission because it was causing constipation.  There is been no other new medications. In the emergency department, the patient was afebrile and hemodynamically stable.  He was tachycardic initially in the 140s.  Oxygen saturation was  100% on room air.  BMP shows sodium 129, potassium 4.3, serum creatinine 2.37.  LFTs were unremarkable.  WBC 22.3, hemoglobin 9.8, platelets 279,000.  Lactic acid 1.6.  CT of the brain was negative for acute findings.  Chest x-ray showed hyperinflation.  The patient was admitted secondary to altered mental status and AKI with SIRS criteria.   Since admission, the patient was empirically started on vanco and cefepime after blood and urine cultures were obtained.  Blood cultures grew Klebsiella pneumoniae.  He was also noted to have 06/29/20 urine culture with Klebsiella pneumonia.  His antibiotics were narrowed to ceftriaxone.  CT of the abdomen showed diffuse urinary bladder wall emphysema with intraluminal air  and extension of air outside bladder wall with superior dissection along anterior peritoneal wall and pelvic side wall.   Assessment/Plan: Sepsis -Patient presented with tachycardia and leukocytosis -Blood cultures= Klebsiella pneumoniae -urine culture = pending -Chest x-ray negative for infiltrates -Started empiric vancomycin and cefepime>>>narrowed to ceftriaxone -PCT 3.72 -Lactic 1.6   Emphysematous Cystitis -urine culture pending, but likely to be Klebsiella -CT abd as discussed above   Klebsiella Bacteremia -source is urine -continue ceftriaxone   Paroxysmal Atrial Fibrillation with RVR -developed evening 6/30 -continue amiodarone for now>>sinus -CHADS-VASc= 4, but not clear if he is good AC candidate long term -06/29/20 Echo--EF 60-65%, no WMA, normal PASP   Acute metabolic encephalopathy -Secondary to AKI and sepsis -gradually improving -F02--637 -CHY--8.502 -Folic DXAJ--2.8   Acute on chronic renal failure--CKD stage IIIa -Baseline creatinine 0.9-1.2 -Presented with serum creatinine  2.37 -Secondary to volume depletion and sepsis -Continue IV fluids   Hydronephrosis -due to emphysematous cystitis/Pyelonephritis -monitior clinically   Hyponatremia -Secondary to  volume depletion and poor solute intake -Start IV fluids<<improving   Uncontrolled diabetes mellitus type 2 with hyperglycemia -05/19/2020 hemoglobin A1c 9.2 -NovoLog sliding scale -lantus 10 units daily>>increase to 18 -add novolog 4 units with meals   Essential hypertension -Holding losartan secondary to AKI -Restart metoprolol>>convert to IV as patient is refusing po meds   Hyperlipidemia -Restart statin   Dementia -Restart Aricept  Hypokalemia -replete -Add KCl to IVF         Status is: Inpatient   Remains inpatient appropriate because:IV treatments appropriate due to intensity of illness or inability to take PO   Dispo: The patient is from: Home              Anticipated d/c is to: Home              Patient currently is not medically stable to d/c.              Difficult to place patient No               Family Communication:   sister updated 7/2   Consultants:  cardiology   Code Status:  FULL   DVT Prophylaxis: [place] Lovenox     Procedures: As Listed in Progress Note Above   Antibiotics: Ceftriaxone 7/1>> -vanc/cefepime 6/30>>7/1     Subjective: Patient denies fevers, chills, headache, chest pain, dyspnea, nausea, vomiting, diarrhea, abdominal pain, dysuria, hematuria, hematochezia, and melena.   Objective: Vitals:   07/24/20 0400 07/24/20 0600 07/24/20 0715 07/24/20 1138  BP: (!) 146/63 132/73    Pulse: 69 66 66 (!) 51  Resp: 18 17 16 13   Temp: (!) 97.3 F (36.3 C)  97.8 F (36.6 C) 98.1 F (36.7 C)  TempSrc: Oral  Oral Oral  SpO2: 100% 100% 99% (!) 84%  Weight:      Height:        Intake/Output Summary (Last 24 hours) at 07/24/2020 1210 Last data filed at 07/24/2020 0715 Gross per 24 hour  Intake 5255.95 ml  Output 2300 ml  Net 2955.95 ml   Weight change:  Exam:  General:  Pt is alert, follows commands appropriately, not in acute distress HEENT: No icterus, No thrush, No neck mass, [place]/AT Cardiovascular: RRR, S1/S2, no rubs, no  gallops Respiratory: CTA bilaterally, no wheezing, no crackles, no rhonchi Abdomen: Soft/+BS, non tender, non distended, no guarding Extremities: No edema, No lymphangitis, No petechiae, No rashes, no synovitis   Data Reviewed: I have personally reviewed following labs and imaging studies Basic Metabolic Panel: Recent Labs  Lab 07/21/20 1055 07/22/20 0438 07/24/20 0328  NA 129* 132* 130*  K 4.3 4.6 3.0*  CL 94* 96* 99  CO2 25 24 21*  GLUCOSE 350* 344* 482*  BUN 82* 88* 70*  CREATININE 2.37* 2.54* 1.85*  CALCIUM 9.4 8.8* 7.7*   Liver Function Tests: Recent Labs  Lab 07/21/20 1055 07/22/20 0438  AST 17 18  ALT 16 15  ALKPHOS 117 103  BILITOT 1.4* 1.3*  PROT 6.9 6.0*  ALBUMIN 3.4* 2.7*   Recent Labs  Lab 07/21/20 1723  LIPASE 19   No results for input(s): AMMONIA in the last 168 hours. Coagulation Profile: No results for input(s): INR, PROTIME in the last 168 hours. CBC: Recent Labs  Lab 07/21/20 1055 07/22/20 0438 07/24/20 0328  WBC 22.3* 22.7* 22.4*  NEUTROABS  19.8*  --   --   HGB 9.8* 9.0* 7.4*  HCT 29.8* 27.3* 22.1*  MCV 95.2 94.8 92.5  PLT 279 178 109*   Cardiac Enzymes: No results for input(s): CKTOTAL, CKMB, CKMBINDEX, TROPONINI in the last 168 hours. BNP: Invalid input(s): POCBNP CBG: Recent Labs  Lab 07/21/20 2049 07/24/20 0818 07/24/20 1207  GLUCAP 298* 434* 333*   HbA1C: No results for input(s): HGBA1C in the last 72 hours. Urine analysis:    Component Value Date/Time   COLORURINE RED (A) 07/22/2020 1350   APPEARANCEUR CLOUDY (A) 07/22/2020 1350   LABSPEC 1.013 07/22/2020 1350   PHURINE 7.0 07/22/2020 1350   GLUCOSEU 50 (A) 07/22/2020 1350   HGBUR LARGE (A) 07/22/2020 1350   BILIRUBINUR NEGATIVE 07/22/2020 1350   KETONESUR 5 (A) 07/22/2020 1350   PROTEINUR 100 (A) 07/22/2020 1350   UROBILINOGEN 0.2 02/18/2014 0950   NITRITE NEGATIVE 07/22/2020 1350   LEUKOCYTESUR MODERATE (A) 07/22/2020 1350   Sepsis  Labs: @LABRCNTIP (procalcitonin:4,lacticidven:4) ) Recent Results (from the past 240 hour(s))  Culture, blood (routine x 2)     Status: Abnormal   Collection Time: 07/21/20 12:43 PM   Specimen: BLOOD  Result Value Ref Range Status   Specimen Description   Final    BLOOD LEFT ARM Performed at [facility], [address] [place], [place] [address]    Special Requests   Final    BOTTLES DRAWN AEROBIC AND ANAEROBIC Blood Culture adequate volume Performed at [facility], [address] [place], [place] [address]    Culture  Setup Time   Final    IN BOTH AEROBIC AND ANAEROBIC BOTTLES GRAM NEGATIVE RODS Gram Stain Report Called to,Read Back By and Verified With: PEACH,RN@0222  07/22/20 MKELLY CRITICAL RESULT CALLED TO, READ BACK BY AND VERIFIED WITH: S,HALL @1038  07/22/20 DV Performed at [facility], [place] [address] [place], [place] [address]    Culture KLEBSIELLA PNEUMONIAE (A)  Final   Report Status 07/24/2020 FINAL  Final   Organism ID, Bacteria KLEBSIELLA PNEUMONIAE  Final      Susceptibility   Klebsiella pneumoniae - MIC*    AMPICILLIN >=32 RESISTANT Resistant     CEFAZOLIN <=4 SENSITIVE Sensitive     CEFEPIME <=0.12 SENSITIVE Sensitive     CEFTAZIDIME <=1 SENSITIVE Sensitive     CEFTRIAXONE <=0.25 SENSITIVE Sensitive     CIPROFLOXACIN <=0.25 SENSITIVE Sensitive     GENTAMICIN <=1 SENSITIVE Sensitive     IMIPENEM <=0.25 SENSITIVE Sensitive     TRIMETH/SULFA <=20 SENSITIVE Sensitive     AMPICILLIN/SULBACTAM >=32 RESISTANT Resistant     PIP/TAZO 16 SENSITIVE Sensitive     * KLEBSIELLA PNEUMONIAE  Culture, blood (routine x 2)     Status: Abnormal   Collection Time: 07/21/20 12:43 PM   Specimen: BLOOD  Result Value Ref Range Status   Specimen Description   Final    BLOOD LEFT HAND Performed at [facility], [address], [place], [place] [address]    Special Requests   Final    BOTTLES DRAWN AEROBIC ONLY Blood Culture results may not be optimal due to an inadequate  volume of blood received in culture bottles Performed at [facility], [address], [place], [place] [address]    Culture  Setup Time   Final    AEROBIC BOTTLE ONLY GRAM NEGATIVE RODS Gram Stain Report Called to,Read Back By and Verified With: PEACH,RN@0223  07/22/20 [facility] Performed at [facility], [address] [place], [place] [address]  Culture (A)  Final    KLEBSIELLA PNEUMONIAE SUSCEPTIBILITIES PERFORMED ON PREVIOUS CULTURE WITHIN THE LAST 5 DAYS. Performed at [facility], [place] [address] [place], [place] [address]    Report Status 07/24/2020 FINAL  Final  Blood Culture ID Panel (Reflexed)     Status: Abnormal   Collection Time: 07/21/20 12:43 PM  Result Value Ref Range Status   Enterococcus faecalis NOT DETECTED NOT DETECTED Final   Enterococcus Faecium NOT DETECTED NOT DETECTED Final   Listeria monocytogenes NOT DETECTED NOT DETECTED Final   Staphylococcus species NOT DETECTED NOT DETECTED Final   Staphylococcus aureus (BCID) NOT DETECTED NOT DETECTED Final   Staphylococcus epidermidis NOT DETECTED NOT DETECTED Final   Staphylococcus lugdunensis NOT DETECTED NOT DETECTED Final   Streptococcus species NOT DETECTED NOT DETECTED Final   Streptococcus agalactiae NOT DETECTED NOT DETECTED Final   Streptococcus pneumoniae NOT DETECTED NOT DETECTED Final   Streptococcus pyogenes NOT DETECTED NOT DETECTED Final   A.calcoaceticus-baumannii NOT DETECTED NOT DETECTED Final   Bacteroides fragilis NOT DETECTED NOT DETECTED Final   Enterobacterales DETECTED (A) NOT DETECTED Final    Comment: Enterobacterales represent a large order of gram negative bacteria, not a single organism. CRITICAL RESULT CALLED TO, READ BACK BY AND VERIFIED WITH: S. HALL PHARMD, AT 1038 07/22/20 D. VANHOOK    Enterobacter cloacae complex NOT DETECTED NOT DETECTED Final   Escherichia coli NOT DETECTED NOT DETECTED Final   Klebsiella aerogenes NOT DETECTED NOT DETECTED Final   Klebsiella oxytoca NOT  DETECTED NOT DETECTED Final   Klebsiella pneumoniae DETECTED (A) NOT DETECTED Final    Comment: CRITICAL RESULT CALLED TO, READ BACK BY AND VERIFIED WITH: S. HALL PHARMD, AT 1038 07/22/20 D. VANHOOK    Proteus species NOT DETECTED NOT DETECTED Final   Salmonella species NOT DETECTED NOT DETECTED Final   Serratia marcescens NOT DETECTED NOT DETECTED Final   Haemophilus influenzae NOT DETECTED NOT DETECTED Final   Neisseria meningitidis NOT DETECTED NOT DETECTED Final   Pseudomonas aeruginosa NOT DETECTED NOT DETECTED Final   Stenotrophomonas maltophilia NOT DETECTED NOT DETECTED Final   Candida albicans NOT DETECTED NOT DETECTED Final   Candida auris NOT DETECTED NOT DETECTED Final   Candida glabrata NOT DETECTED NOT DETECTED Final   Candida krusei NOT DETECTED NOT DETECTED Final   Candida parapsilosis NOT DETECTED NOT DETECTED Final   Candida tropicalis NOT DETECTED NOT DETECTED Final   Cryptococcus neoformans/gattii NOT DETECTED NOT DETECTED Final   CTX-M ESBL NOT DETECTED NOT DETECTED Final   Carbapenem resistance IMP NOT DETECTED NOT DETECTED Final   Carbapenem resistance KPC NOT DETECTED NOT DETECTED Final   Carbapenem resistance NDM NOT DETECTED NOT DETECTED Final   Carbapenem resist OXA 48 LIKE NOT DETECTED NOT DETECTED Final   Carbapenem resistance VIM NOT DETECTED NOT DETECTED Final    Comment: Performed at [facility], [address], [place], [place] [address]  MRSA Next Gen by PCR, Nasal     Status: None   Collection Time: 07/21/20 11:56 PM   Specimen: Nasal Mucosa; Nasal Swab  Result Value Ref Range Status   MRSA by PCR Next Gen NOT DETECTED NOT DETECTED Final    Comment: (NOTE) The GeneXpert MRSA Assay (FDA approved for NASAL specimens only), is one component of a comprehensive MRSA colonization surveillance program. It is not intended to diagnose MRSA infection nor to guide or monitor treatment for MRSA infections. Test performance is not FDA approved in  patients less than 2  years old. Performed at [facility], [address], [place], [place] [address]      Scheduled Meds:  atorvastatin  40 mg Oral QHS   Chlorhexidine Gluconate Cloth  6 each Topical Daily   donepezil  10 mg Oral QHS   enoxaparin (LOVENOX) injection  60 mg Subcutaneous Q24H   feeding supplement (GLUCERNA SHAKE)  237 mL Oral TID BM   insulin aspart  0-15 Units Subcutaneous TID WC   insulin aspart  0-5 Units Subcutaneous QHS   insulin glargine  10 Units Subcutaneous Daily   metoprolol tartrate  2.5 mg Intravenous Q6H   multivitamin with minerals  1 tablet Oral Daily   Continuous Infusions:  sodium chloride 125 mL/hr at 07/24/20 1022   amiodarone 30 mg/hr (07/24/20 0609)   cefTRIAXone (ROCEPHIN)  IV Stopped (07/23/20 1140)    Procedures/Studies: CT ABDOMEN PELVIS WO CONTRAST  Result Date: 07/21/2020 CLINICAL DATA:  75 year old male with acute abdominal pain. EXAM: CT ABDOMEN AND PELVIS WITHOUT CONTRAST TECHNIQUE: Multidetector CT imaging of the abdomen and pelvis was performed following the standard protocol without IV contrast. COMPARISON:  CT abdomen pelvis dated 07/09/2016. FINDINGS: Evaluation of this exam is limited in the absence of intravenous contrast. Evaluation is also limited due to streak artifact caused by patient's arms and loss of intra-abdominal fat and cachexia as well as anasarca. Lower chest: The visualized lung bases are clear. There is coronary vascular calcification. Hepatobiliary: The liver is grossly unremarkable. No intrahepatic biliary dilatation. Cholecystectomy. Pancreas: The pancreas is poorly visualized. Spleen: The spleen is grossly unremarkable. Adrenals/Urinary Tract: Mild bilateral adrenal thickening. There is mild bilateral hydronephrosis. Probable bilateral renal vascular calcification. The urinary bladder is distended. There is diffuse bladder wall emphysema with large amount of intraluminal air. Findings consistent with emphysematous  cystitis correlation with clinical exam and urinalysis recommended. There is extension of the air outside of the bladder wall and superior dissection along the fat planes of the anterior peritoneal wall and bilateral flank and pelvic sidewall. Clinical correlation recommended to evaluate for possibility of sepsis and peritonitis. Stomach/Bowel: No evidence of bowel obstruction. Evaluation of the bowel is however is limited due to factors above. Vascular/Lymphatic: Advanced aortoiliac atherosclerotic disease. Evaluation of the vasculature is very limited on this CT. Reproductive: The prostate gland is poorly visualized. There is air within the urethra. Other: There is loss of subcutaneous and intra-abdominal fat and cachexia. Musculoskeletal: Severe osteopenia with degenerative changes of the spine. L4-S1 posterior fusion. No acute osseous pathology. IMPRESSION: 1. Emphysematous cystitis with extension of air along the anterior peritoneal and lateral pelvic sidewalls. Clinical correlation is recommended to evaluate for possibility of sepsis and peritonitis. There is air within the urethra. 2. Mild bilateral hydronephrosis. 3. No bowel obstruction. 4. Aortic Atherosclerosis (ICD10-I70.0) and Emphysema (ICD10-J43.9). These results were called by telephone at the time of interpretation on 07/21/2020 at 8:36 pm to the patient's nurse, Panther, who verbally acknowledged these results. Electronically Signed   By: Anner Crete M.D.   On: 07/21/2020 20:39   DG Chest 1 View  Result Date: 07/21/2020 CLINICAL DATA:  75 year old male with confusion and weakness. Recent UTI. EXAM: CHEST  1 VIEW COMPARISON:  Chest CTA 06/29/2020 and earlier. FINDINGS: Portable AP upright view at 1019 hours. Stable lung volumes and mediastinal contours. Emphysema demonstrated on the prior CTA. The mildly spiculated right upper lobe pulmonary nodule seen by CT is poorly visible with plain radiograph technique. Allowing for portable technique  the lungs are clear. Visualized tracheal air column  is within normal limits. Prior cervical ACDF. No acute osseous abnormality identified. Negative visible bowel gas pattern. IMPRESSION: 1. The suspicious right upper lobe pulmonary nodule seen by CT 06/29/2020 is occult by portable x-ray. 2. Emphysema.  No new cardiopulmonary abnormality. Electronically Signed   By: Genevie Ann M.D.   On: 07/21/2020 11:01   CT Head Wo Contrast  Result Date: 07/21/2020 CLINICAL DATA:  75 year old male with confusion, agitation, altered mental status. EXAM: CT HEAD WITHOUT CONTRAST TECHNIQUE: Contiguous axial images were obtained from the base of the skull through the vertex without intravenous contrast. COMPARISON:  Brain MRI 08/27/2018.  Head CT 07/08/2015. FINDINGS: Brain: Mild cerebral volume loss since 2017 appears to be generalized. No midline shift, ventriculomegaly, mass effect, evidence of mass lesion, intracranial hemorrhage or evidence of cortically based acute infarction. Patchy and confluent bilateral cerebral white matter hypodensity has progressed since 2017 but appears stable to the 2020 MRI. Bilateral basal ganglia vascular calcifications. No cortical encephalomalacia. Vascular: Calcified atherosclerosis at the skull base. No suspicious intracranial vascular hyperdensity. Skull: Mild chronic right lamina papyracea fracture. Otherwise negative. No acute osseous abnormality identified. Sinuses/Orbits: Visualized paranasal sinuses and mastoids are stable and well aerated. Other: Visualized orbits and scalp soft tissues are within normal limits. IMPRESSION: 1. No acute intracranial abnormality. 2. Chronic small vessel disease appears stable since a 2020 MRI. Electronically Signed   By: Genevie Ann M.D.   On: 07/21/2020 11:05   CT Angio Chest Pulmonary Embolism (PE) W or WO Contrast  Result Date: 06/29/2020 CLINICAL DATA:  Rule out pulmonary embolus.  Elevated D-dimer EXAM: CT ANGIOGRAPHY CHEST WITH CONTRAST TECHNIQUE:  Multidetector CT imaging of the chest was performed using the standard protocol during bolus administration of intravenous contrast. Multiplanar CT image reconstructions and MIPs were obtained to evaluate the vascular anatomy. CONTRAST:  3mL OMNIPAQUE IOHEXOL 350 MG/ML SOLN COMPARISON:  CT chest 06/30/2019 FINDINGS: Cardiovascular: Satisfactory opacification of the pulmonary arteries to the segmental level. No evidence of pulmonary embolism. Normal heart size. Aortic atherosclerosis. Coronary artery calcifications. No pericardial effusion. Mediastinum/Nodes: No enlarged mediastinal, hilar, or axillary lymph nodes. Thyroid gland, trachea, and esophagus demonstrate no significant findings. Lungs/Pleura: Moderate to advanced changes of centrilobular and paraseptal emphysema. Within the right upper lobe there is a solid-appearing nodule which measures 1.6 x 1.4 by 1.4 cm, image 32/3 Focal area of subpleural consolidation is noted within the posteromedial left base, image 74/7. Small left pleural effusion is present. Upper Abdomen: No acute abnormality. Musculoskeletal: No chest wall abnormality. No acute or significant osseous findings. Review of the MIP images confirms the above findings. IMPRESSION: 1. No evidence for acute pulmonary embolus. 2. Right upper lobe pulmonary nodule is identified measuring up to 1.6 cm. Suspicious for primary bronchogenic carcinoma. Further evaluation with PET-CT as warranted. 3. Subpleural consolidation in the posterior left base with small left pleural effusion. Favor inflammatory or infectious etiology. A 3 month, short-term interval follow-up CT of the chest is recommended to ensure resolution. This could also be readdressed at PET-CT. 4. Aortic Atherosclerosis (ICD10-I70.0) and Emphysema (ICD10-J43.9). 5. Coronary artery calcifications. Electronically Signed   By: Kerby Moors M.D.   On: 06/29/2020 09:59   ECHOCARDIOGRAM COMPLETE  Result Date: 06/29/2020    ECHOCARDIOGRAM  REPORT   Patient Name:   ANDRY BOGDEN Date of Exam: 06/29/2020 Medical Rec #:  [id number]      Height:       73.0 in Accession #:    [id number]     Weight:  127.2 lb Date of Birth:  [birth year]     BSA:          1.775 m Patient Age:    59 years       BP:           159/83 mmHg Patient Gender: M              HR:           95 bpm. Exam Location:  Inpatient Procedure: 2D Echo, Cardiac Doppler and Color Doppler Indications:    I48.91* Unspeicified atrial fibrillation  History:        Patient has prior history of Echocardiogram examinations, most                 recent 07/10/2015. PAD. One day post BKA.  Sonographer:    Merrie Roof RDCS Referring Phys: [place]  1. Left ventricular ejection fraction, by estimation, is 60 to 65%. The left ventricle has normal function. The left ventricle has no regional wall motion abnormalities. Left ventricular diastolic parameters are indeterminate.  2. Right ventricular systolic function is normal. The right ventricular size is normal. There is normal pulmonary artery systolic pressure. The estimated right ventricular systolic pressure is 69.6 mmHg.  3. Left atrial size was mildly dilated.  4. Right atrial size was mildly dilated.  5. The mitral valve is normal in structure. Mild mitral valve regurgitation. No evidence of mitral stenosis.  6. The aortic valve is normal in structure. Aortic valve regurgitation is not visualized. Mild to moderate aortic valve sclerosis/calcification is present, without any evidence of aortic stenosis.  7. The inferior vena cava is normal in size with greater than 50% respiratory variability, suggesting right atrial pressure of 3 mmHg. FINDINGS  Left Ventricle: Left ventricular ejection fraction, by estimation, is 60 to 65%. The left ventricle has normal function. The left ventricle has no regional wall motion abnormalities. The left ventricular internal cavity size was normal in size. There is  no left ventricular  hypertrophy. Left ventricular diastolic parameters are indeterminate. Right Ventricle: The right ventricular size is normal. No increase in right ventricular wall thickness. Right ventricular systolic function is normal. There is normal pulmonary artery systolic pressure. The tricuspid regurgitant velocity is 2.66 m/s, and  with an assumed right atrial pressure of 3 mmHg, the estimated right ventricular systolic pressure is 29.5 mmHg. Left Atrium: Left atrial size was mildly dilated. Right Atrium: Right atrial size was mildly dilated. Pericardium: There is no evidence of pericardial effusion. Mitral Valve: The mitral valve is normal in structure. Mild mitral valve regurgitation. No evidence of mitral valve stenosis. Tricuspid Valve: The tricuspid valve is normal in structure. Tricuspid valve regurgitation is mild . No evidence of tricuspid stenosis. Aortic Valve: The aortic valve is normal in structure. Aortic valve regurgitation is not visualized. Mild to moderate aortic valve sclerosis/calcification is present, without any evidence of aortic stenosis. Aortic valve mean gradient measures 4.0 mmHg. Aortic valve peak gradient measures 7.8 mmHg. Aortic valve area, by VTI measures 2.01 cm. Pulmonic Valve: The pulmonic valve was normal in structure. Pulmonic valve regurgitation is not visualized. No evidence of pulmonic stenosis. Aorta: The aortic root is normal in size and structure. Venous: The inferior vena cava is normal in size with greater than 50% respiratory variability, suggesting right atrial pressure of 3 mmHg. IAS/Shunts: No atrial level shunt detected by color flow Doppler.  LEFT VENTRICLE PLAX 2D LVIDd:  5.10 cm LVIDs:         3.80 cm LV PW:         1.00 cm LV IVS:        1.00 cm LVOT diam:     2.00 cm LV SV:         54 LV SV Index:   30 LVOT Area:     3.14 cm  RIGHT VENTRICLE          IVC RV Basal diam:  3.90 cm  IVC diam: 1.60 cm LEFT ATRIUM             Index       RIGHT ATRIUM           Index  LA diam:        3.80 cm 2.14 cm/m  RA Area:     20.00 cm LA Vol (A2C):   69.7 ml 39.26 ml/m RA Volume:   59.60 ml  33.57 ml/m LA Vol (A4C):   69.6 ml 39.21 ml/m LA Biplane Vol: 72.4 ml 40.78 ml/m  AORTIC VALVE AV Area (Vmax):    2.05 cm AV Area (Vmean):   2.12 cm AV Area (VTI):     2.01 cm AV Vmax:           140.00 cm/s AV Vmean:          92.800 cm/s AV VTI:            0.267 m AV Peak Grad:      7.8 mmHg AV Mean Grad:      4.0 mmHg LVOT Vmax:         91.40 cm/s LVOT Vmean:        62.500 cm/s LVOT VTI:          0.171 m LVOT/AV VTI ratio: 0.64  AORTA Ao Root diam: 3.60 cm TRICUSPID VALVE TR Peak grad:   28.3 mmHg TR Vmax:        266.00 cm/s  SHUNTS Systemic VTI:  0.17 m Systemic Diam: 2.00 cm Candee Furbish MD Electronically signed by Candee Furbish MD Signature Date/Time: 06/29/2020/5:23:45 PM    Final     Orson Eva, DO  [facility]  If 7PM-7AM, please contact night-coverage [email or web address] Password [email or web address] 07/24/2020, 12:10 PM   LOS: 3 days

## 2020-07-24 NOTE — Progress Notes (Signed)
0615 Pt has been pleasantly confused the whole shift but this AM pt was awake in bed. RN attempted to change bed pad and reposition pt. Pt extremely upset, confused, re-oriented by RN. NT into room and pt yelling, "Leave me the F&*! Alone!". RN attempted to re-orient pt but no calming him down. Pt resting in bed, fall precautions in place, WCTM.

## 2020-07-24 NOTE — Progress Notes (Signed)
PROGRESS NOTE  Allen Yu YPP:[id number] DOB: Oct 05, 1945 DOA: 07/21/2020 PCP: [name], MD  Brief History:   75 y.o. 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  The patient is unable to give any significant history secondary to his altered mental status.  All of this history is obtained from review of medical record and speaking with the patient spouse.  Accordingly, the patient was recently discharged from the hospital after a stay from 06/28/2020 to 06/30/2020 when he underwent a left AKA on 06/28/2020 secondary to a poorly healing left heel ulceration.  His hospitalization was complicated by AKI as well as acute blood loss anemia.  He also had a self-limited episode of atrial fibrillation.  Anticoagulation was deferred secondary to his high fall risk and low A. fib burden.  Nevertheless, the patient's spouse states that he has had apathy and has been less communicative over the past week.  She states that he has quit taking all his medications which at baseline he normally is able to dispense to himself.  She has noted that he has been less talkative and more agitated.   She was concerned about a possible UTI and contacted the patient's PCP.  Apparently a urine sample was sent to [facility] on 07/19/2020.  Results are unknown at this time.  Patient was not started on any antibiotics.  However, the patient was prescribed tramadol on 07/18/2020 by his PCP.  He quit taking his Percocet that was prescribed at the time of discharge from the hospital from his last admission because it was causing constipation.  There is been no other new medications. In the emergency department, the patient was afebrile and hemodynamically stable.  He was tachycardic initially in the 140s.  Oxygen saturation was  100% on room air.  BMP shows sodium 129, potassium 4.3, serum creatinine 2.37.  LFTs were unremarkable.  WBC 22.3, hemoglobin 9.8, platelets 279,000.  Lactic acid 1.6.  CT of the brain was negative for acute findings.  Chest x-ray showed hyperinflation.  The patient was admitted secondary to altered mental status and AKI with SIRS criteria.   Since admission, the patient was empirically started on vanco and cefepime after blood and urine cultures were obtained.  Blood cultures grew Klebsiella pneumoniae.  He was also noted to have 06/29/20 urine culture with Klebsiella pneumonia.  His antibiotics were narrowed to ceftriaxone.  CT of the abdomen showed diffuse urinary bladder wall emphysema with intraluminal air  and extension of air outside bladder wall with superior dissection along anterior peritoneal wall and pelvic side wall.   Assessment/Plan: Sepsis -Patient presented with tachycardia and leukocytosis -Blood cultures= Klebsiella pneumoniae -urine culture = GNR -Chest x-ray negative for infiltrates -Started empiric vancomycin and cefepime>>>narrowed to ceftriaxone -PCT 3.72   Emphysematous Cystitis -urine culture pending, but likely to be Klebsiella -CT abd as discussed above -case discussed with urology, Dr. Lillia Dallas foley, repeat CT abd/pelvis if no improvment   Klebsiella Bacteremia -source is urine -continue ceftriaxone   Paroxysmal Atrial Fibrillation with RVR -developed evening 6/30 -continue amiodarone for now>>sinus -07/24/20--d/c amiodarone drip now that sepsis physiology improved -CHADS-VASc= 4, but not clear if he is good AC candidate long term -06/29/20 Echo--EF 60-65%, no WMA, normal PASP   Acute metabolic encephalopathy -Secondary to AKI and sepsis --W58--099 -IPJ--8.250 -Folic  acid--7.5 -improving, now near baseline   Acute on chronic renal failure--CKD stage IIIa -Baseline creatinine 0.9-1.2 -serum creatine peaked 2.54 -Secondary to volume depletion and  sepsis -Continue IV fluids   Hydronephrosis -due to emphysematous cystitis/Pyelonephritis -monitior clinically -foley placed>>800cc out   Hyponatremia -Secondary to volume depletion and poor solute intake -Start IV fluids>>improving   Uncontrolled diabetes mellitus type 2 with hyperglycemia -05/19/2020 hemoglobin A1c 9.2 -NovoLog sliding scale -lantus 10 units daily>>increase to 15 units   Essential hypertension -Holding losartan secondary to AKI -Restart metoprolol>>convert to IV as patient is refusing po meds   Hyperlipidemia -Restart statin   Dementia -Restart Aricept  Hypokalemia -replete -check mag -add KCL to IVF         Status is: Inpatient   Remains inpatient appropriate because:IV treatments appropriate due to intensity of illness or inability to take PO   Dispo: The patient is from: Home              Anticipated d/c is to: Home              Patient currently is not medically stable to d/c.              Difficult to place patient No               Family Communication:   sister updated 7/2, left VM for spouse 7/3   Consultants:  cardiology   Code Status:  FULL   DVT Prophylaxis: [place] Lovenox     Procedures: As Listed in Progress Note Above   Antibiotics: Ceftriaxone 7/1>> -vanc/cefepime 6/30>>7/1        Subjective:  Patient denies fevers, chills, headache, chest pain, dyspnea, nausea, vomiting, diarrhea, abdominal pain, dysuria, hematuria, hematochezia, and melena.  Objective: Vitals:   07/24/20 0400 07/24/20 0600 07/24/20 0715 07/24/20 1138  BP: (!) 146/63 132/73    Pulse: 69 66 66 (!) 51  Resp: 18 17 16 13   Temp: (!) 97.3 F (36.3 C)  97.8 F (36.6 C) 98.1 F (36.7 C)  TempSrc: Oral  Oral Oral  SpO2: 100% 100% 99% (!) 84%  Weight:      Height:        Intake/Output Summary (Last 24 hours) at 07/24/2020 1302 Last data filed at 07/24/2020 0715 Gross per 24 hour  Intake 5255.95 ml  Output 2300 ml  Net 2955.95 ml   Weight  change:  Exam:  General:  Pt is alert, follows commands appropriately, not in acute distress HEENT: No icterus, No thrush, No neck mass, [place]/AT Cardiovascular: RRR, S1/S2, no rubs, no gallops Respiratory: CTA bilaterally, no wheezing, no crackles, no rhonchi Abdomen: Soft/+BS, non tender, non distended, no guarding Extremities: No edema, No lymphangitis, No petechiae, No rashes, no synovitis; L-AKA--no erythema, drainage   Data Reviewed: I have personally reviewed following labs and imaging studies Basic Metabolic Panel: Recent Labs  Lab 07/21/20 1055 07/22/20 0438 07/24/20 0328  NA 129* 132* 130*  K 4.3 4.6 3.0*  CL 94* 96* 99  CO2 25 24 21*  GLUCOSE 350* 344* 482*  BUN 82* 88* 70*  CREATININE 2.37* 2.54* 1.85*  CALCIUM 9.4 8.8* 7.7*   Liver Function Tests: Recent Labs  Lab 07/21/20 1055 07/22/20 0438  AST 17 18  ALT 16 15  ALKPHOS 117 103  BILITOT 1.4* 1.3*  PROT 6.9 6.0*  ALBUMIN 3.4* 2.7*   Recent Labs  Lab 07/21/20 1723  LIPASE 19   No results for input(s): AMMONIA in the last 168 hours.  Coagulation Profile: No results for input(s): INR, PROTIME in the last 168 hours. CBC: Recent Labs  Lab 07/21/20 1055 07/22/20 0438 07/24/20 0328  WBC 22.3* 22.7* 22.4*  NEUTROABS 19.8*  --   --   HGB 9.8* 9.0* 7.4*  HCT 29.8* 27.3* 22.1*  MCV 95.2 94.8 92.5  PLT 279 178 109*   Cardiac Enzymes: No results for input(s): CKTOTAL, CKMB, CKMBINDEX, TROPONINI in the last 168 hours. BNP: Invalid input(s): POCBNP CBG: Recent Labs  Lab 07/21/20 2049 07/24/20 0818 07/24/20 1207  GLUCAP 298* 434* 333*   HbA1C: No results for input(s): HGBA1C in the last 72 hours. Urine analysis:    Component Value Date/Time   COLORURINE RED (A) 07/22/2020 1350   APPEARANCEUR CLOUDY (A) 07/22/2020 1350   LABSPEC 1.013 07/22/2020 1350   PHURINE 7.0 07/22/2020 1350   GLUCOSEU 50 (A) 07/22/2020 1350   HGBUR LARGE (A) 07/22/2020 1350   BILIRUBINUR NEGATIVE 07/22/2020 1350    KETONESUR 5 (A) 07/22/2020 1350   PROTEINUR 100 (A) 07/22/2020 1350   UROBILINOGEN 0.2 02/18/2014 0950   NITRITE NEGATIVE 07/22/2020 1350   LEUKOCYTESUR MODERATE (A) 07/22/2020 1350   Sepsis Labs: @LABRCNTIP (procalcitonin:4,lacticidven:4) ) Recent Results (from the past 240 hour(s))  Culture, blood (routine x 2)     Status: Abnormal   Collection Time: 07/21/20 12:43 PM   Specimen: BLOOD  Result Value Ref Range Status   Specimen Description   Final    BLOOD LEFT ARM Performed at [facility], [address] [place], [place] [address]    Special Requests   Final    BOTTLES DRAWN AEROBIC AND ANAEROBIC Blood Culture adequate volume Performed at [facility], [address] [place], [place] [address]    Culture  Setup Time   Final    IN BOTH AEROBIC AND ANAEROBIC BOTTLES GRAM NEGATIVE RODS Gram Stain Report Called to,Read Back By and Verified With: PEACH,RN@0222  07/22/20 MKELLY CRITICAL RESULT CALLED TO, READ BACK BY AND VERIFIED WITH: S,HALL @1038  07/22/20 DV Performed at [facility], [place] [address], [place], [place] [address]    Culture KLEBSIELLA PNEUMONIAE (A)  Final   Report Status 07/24/2020 FINAL  Final   Organism ID, Bacteria KLEBSIELLA PNEUMONIAE  Final      Susceptibility   Klebsiella pneumoniae - MIC*    AMPICILLIN >=32 RESISTANT Resistant     CEFAZOLIN <=4 SENSITIVE Sensitive     CEFEPIME <=0.12 SENSITIVE Sensitive     CEFTAZIDIME <=1 SENSITIVE Sensitive     CEFTRIAXONE <=0.25 SENSITIVE Sensitive     CIPROFLOXACIN <=0.25 SENSITIVE Sensitive     GENTAMICIN <=1 SENSITIVE Sensitive     IMIPENEM <=0.25 SENSITIVE Sensitive     TRIMETH/SULFA <=20 SENSITIVE Sensitive     AMPICILLIN/SULBACTAM >=32 RESISTANT Resistant     PIP/TAZO 16 SENSITIVE Sensitive     * KLEBSIELLA PNEUMONIAE  Culture, blood (routine x 2)     Status: Abnormal   Collection Time: 07/21/20 12:43 PM   Specimen: BLOOD  Result Value Ref Range Status   Specimen Description   Final    BLOOD LEFT  HAND Performed at [facility], [address] [place], [place] [address]    Special Requests   Final    BOTTLES DRAWN AEROBIC ONLY Blood Culture results may not be optimal due to an inadequate volume of blood received in culture bottles Performed at [facility], [address] [place], [place] [address]    Culture  Setup Time   Final    AEROBIC BOTTLE  ONLY GRAM NEGATIVE RODS Gram Stain Report Called to,Read Back By and Verified With: PEACH,[email or web address]  07/22/20 [facility] Performed at [facility], [address] [place], [place] [address]    Culture (A)  Final    KLEBSIELLA PNEUMONIAE SUSCEPTIBILITIES PERFORMED ON PREVIOUS CULTURE WITHIN THE LAST 5 DAYS. Performed at [facility], [place] [address] [place], [place] [address]    Report Status 07/24/2020 FINAL  Final  Blood Culture ID Panel (Reflexed)     Status: Abnormal   Collection Time: 07/21/20 12:43 PM  Result Value Ref Range Status   Enterococcus faecalis NOT DETECTED NOT DETECTED Final   Enterococcus Faecium NOT DETECTED NOT DETECTED Final   Listeria monocytogenes NOT DETECTED NOT DETECTED Final   Staphylococcus species NOT DETECTED NOT DETECTED Final   Staphylococcus aureus (BCID) NOT DETECTED NOT DETECTED Final   Staphylococcus epidermidis NOT DETECTED NOT DETECTED Final   Staphylococcus lugdunensis NOT DETECTED NOT DETECTED Final   Streptococcus species NOT DETECTED NOT DETECTED Final   Streptococcus agalactiae NOT DETECTED NOT DETECTED Final   Streptococcus pneumoniae NOT DETECTED NOT DETECTED Final   Streptococcus pyogenes NOT DETECTED NOT DETECTED Final   A.calcoaceticus-baumannii NOT DETECTED NOT DETECTED Final   Bacteroides fragilis NOT DETECTED NOT DETECTED Final   Enterobacterales DETECTED (A) NOT DETECTED Final    Comment: Enterobacterales represent a large order of gram negative bacteria, not a single organism. CRITICAL RESULT CALLED TO, READ BACK BY AND VERIFIED WITH: S. HALL PHARMD, AT 1038 07/22/20 D. VANHOOK     Enterobacter cloacae complex NOT DETECTED NOT DETECTED Final   Escherichia coli NOT DETECTED NOT DETECTED Final   Klebsiella aerogenes NOT DETECTED NOT DETECTED Final   Klebsiella oxytoca NOT DETECTED NOT DETECTED Final   Klebsiella pneumoniae DETECTED (A) NOT DETECTED Final    Comment: CRITICAL RESULT CALLED TO, READ BACK BY AND VERIFIED WITH: S. HALL PHARMD, AT 1038 07/22/20 D. VANHOOK    Proteus species NOT DETECTED NOT DETECTED Final   Salmonella species NOT DETECTED NOT DETECTED Final   Serratia marcescens NOT DETECTED NOT DETECTED Final   Haemophilus influenzae NOT DETECTED NOT DETECTED Final   Neisseria meningitidis NOT DETECTED NOT DETECTED Final   Pseudomonas aeruginosa NOT DETECTED NOT DETECTED Final   Stenotrophomonas maltophilia NOT DETECTED NOT DETECTED Final   Candida albicans NOT DETECTED NOT DETECTED Final   Candida auris NOT DETECTED NOT DETECTED Final   Candida glabrata NOT DETECTED NOT DETECTED Final   Candida krusei NOT DETECTED NOT DETECTED Final   Candida parapsilosis NOT DETECTED NOT DETECTED Final   Candida tropicalis NOT DETECTED NOT DETECTED Final   Cryptococcus neoformans/gattii NOT DETECTED NOT DETECTED Final   CTX-M ESBL NOT DETECTED NOT DETECTED Final   Carbapenem resistance IMP NOT DETECTED NOT DETECTED Final   Carbapenem resistance KPC NOT DETECTED NOT DETECTED Final   Carbapenem resistance NDM NOT DETECTED NOT DETECTED Final   Carbapenem resist OXA 48 LIKE NOT DETECTED NOT DETECTED Final   Carbapenem resistance VIM NOT DETECTED NOT DETECTED Final    Comment: Performed at [facility], [address] [place], [place] [address]  MRSA Next Gen by PCR, Nasal     Status: None   Collection Time: 07/21/20 11:56 PM   Specimen: Nasal Mucosa; Nasal Swab  Result Value Ref Range Status   MRSA by PCR Next Gen NOT DETECTED NOT DETECTED Final    Comment: (NOTE) The GeneXpert MRSA Assay (FDA approved for NASAL specimens only), is one component of a  comprehensive MRSA  colonization surveillance program. It is not intended to diagnose MRSA infection nor to guide or monitor treatment for MRSA infections. Test performance is not FDA approved in patients less than 47 years old. Performed at [facility], [address], [place], [place] [address]   Urine culture     Status: Abnormal (Preliminary result)   Collection Time: 07/22/20  1:50 PM   Specimen: Urine, Clean Catch  Result Value Ref Range Status   Specimen Description   Final    URINE, CLEAN CATCH Performed at [facility], [address], [place], [place] [address]    Special Requests   Final    NONE Performed at [facility], [address] [place], [place] [address]    Culture (A)  Final    80,000 COLONIES/mL GRAM NEGATIVE RODS SUSCEPTIBILITIES TO FOLLOW Performed at [facility], [place] [address], [place], [place] [address]    Report Status PENDING  Incomplete     Scheduled Meds:  atorvastatin  40 mg Oral QHS   Chlorhexidine Gluconate Cloth  6 each Topical Daily   donepezil  10 mg Oral QHS   enoxaparin (LOVENOX) injection  60 mg Subcutaneous Q24H   feeding supplement (GLUCERNA SHAKE)  237 mL Oral TID BM   insulin aspart  0-15 Units Subcutaneous TID WC   insulin aspart  0-5 Units Subcutaneous QHS   insulin glargine  10 Units Subcutaneous Daily   metoprolol tartrate  2.5 mg Intravenous Q6H   multivitamin with minerals  1 tablet Oral Daily   potassium chloride  40 mEq Oral Once   Continuous Infusions:  0.9 % NaCl with KCl 20 mEq / L 100 mL/hr at 07/24/20 1300   amiodarone 30 mg/hr (07/24/20 0609)   cefTRIAXone (ROCEPHIN)  IV Stopped (07/23/20 1140)    Procedures/Studies: CT ABDOMEN PELVIS WO CONTRAST  Result Date: 07/21/2020 CLINICAL DATA:  75 year old male with acute abdominal pain. EXAM: CT ABDOMEN AND PELVIS WITHOUT CONTRAST TECHNIQUE: Multidetector CT imaging of the abdomen and pelvis was performed following the standard protocol without IV contrast.  COMPARISON:  CT abdomen pelvis dated 07/09/2016. FINDINGS: Evaluation of this exam is limited in the absence of intravenous contrast. Evaluation is also limited due to streak artifact caused by patient's arms and loss of intra-abdominal fat and cachexia as well as anasarca. Lower chest: The visualized lung bases are clear. There is coronary vascular calcification. Hepatobiliary: The liver is grossly unremarkable. No intrahepatic biliary dilatation. Cholecystectomy. Pancreas: The pancreas is poorly visualized. Spleen: The spleen is grossly unremarkable. Adrenals/Urinary Tract: Mild bilateral adrenal thickening. There is mild bilateral hydronephrosis. Probable bilateral renal vascular calcification. The urinary bladder is distended. There is diffuse bladder wall emphysema with large amount of intraluminal air. Findings consistent with emphysematous cystitis correlation with clinical exam and urinalysis recommended. There is extension of the air outside of the bladder wall and superior dissection along the fat planes of the anterior peritoneal wall and bilateral flank and pelvic sidewall. Clinical correlation recommended to evaluate for possibility of sepsis and peritonitis. Stomach/Bowel: No evidence of bowel obstruction. Evaluation of the bowel is however is limited due to factors above. Vascular/Lymphatic: Advanced aortoiliac atherosclerotic disease. Evaluation of the vasculature is very limited on this CT. Reproductive: The prostate gland is poorly visualized. There is air within the urethra. Other: There is loss of subcutaneous and intra-abdominal fat and cachexia. Musculoskeletal: Severe osteopenia with degenerative changes of the spine. L4-S1 posterior fusion. No acute osseous pathology. IMPRESSION: 1. Emphysematous cystitis with extension of air along  the anterior peritoneal and lateral pelvic sidewalls. Clinical correlation is recommended to evaluate for possibility of sepsis and peritonitis. There is air  within the urethra. 2. Mild bilateral hydronephrosis. 3. No bowel obstruction. 4. Aortic Atherosclerosis (ICD10-I70.0) and Emphysema (ICD10-J43.9). These results were called by telephone at the time of interpretation on 07/21/2020 at 8:36 pm to the patient's nurse, Panther, who verbally acknowledged these results. Electronically Signed   By: Anner Crete M.D.   On: 07/21/2020 20:39   DG Chest 1 View  Result Date: 07/21/2020 CLINICAL DATA:  75 year old male with confusion and weakness. Recent UTI. EXAM: CHEST  1 VIEW COMPARISON:  Chest CTA 06/29/2020 and earlier. FINDINGS: Portable AP upright view at 1019 hours. Stable lung volumes and mediastinal contours. Emphysema demonstrated on the prior CTA. The mildly spiculated right upper lobe pulmonary nodule seen by CT is poorly visible with plain radiograph technique. Allowing for portable technique the lungs are clear. Visualized tracheal air column is within normal limits. Prior cervical ACDF. No acute osseous abnormality identified. Negative visible bowel gas pattern. IMPRESSION: 1. The suspicious right upper lobe pulmonary nodule seen by CT 06/29/2020 is occult by portable x-ray. 2. Emphysema.  No new cardiopulmonary abnormality. Electronically Signed   By: Genevie Ann M.D.   On: 07/21/2020 11:01   CT Head Wo Contrast  Result Date: 07/21/2020 CLINICAL DATA:  75 year old male with confusion, agitation, altered mental status. EXAM: CT HEAD WITHOUT CONTRAST TECHNIQUE: Contiguous axial images were obtained from the base of the skull through the vertex without intravenous contrast. COMPARISON:  Brain MRI 08/27/2018.  Head CT 07/08/2015. FINDINGS: Brain: Mild cerebral volume loss since 2017 appears to be generalized. No midline shift, ventriculomegaly, mass effect, evidence of mass lesion, intracranial hemorrhage or evidence of cortically based acute infarction. Patchy and confluent bilateral cerebral white matter hypodensity has progressed since 2017 but appears  stable to the 2020 MRI. Bilateral basal ganglia vascular calcifications. No cortical encephalomalacia. Vascular: Calcified atherosclerosis at the skull base. No suspicious intracranial vascular hyperdensity. Skull: Mild chronic right lamina papyracea fracture. Otherwise negative. No acute osseous abnormality identified. Sinuses/Orbits: Visualized paranasal sinuses and mastoids are stable and well aerated. Other: Visualized orbits and scalp soft tissues are within normal limits. IMPRESSION: 1. No acute intracranial abnormality. 2. Chronic small vessel disease appears stable since a 2020 MRI. Electronically Signed   By: Genevie Ann M.D.   On: 07/21/2020 11:05   CT Angio Chest Pulmonary Embolism (PE) W or WO Contrast  Result Date: 06/29/2020 CLINICAL DATA:  Rule out pulmonary embolus.  Elevated D-dimer EXAM: CT ANGIOGRAPHY CHEST WITH CONTRAST TECHNIQUE: Multidetector CT imaging of the chest was performed using the standard protocol during bolus administration of intravenous contrast. Multiplanar CT image reconstructions and MIPs were obtained to evaluate the vascular anatomy. CONTRAST:  89mL OMNIPAQUE IOHEXOL 350 MG/ML SOLN COMPARISON:  CT chest 06/30/2019 FINDINGS: Cardiovascular: Satisfactory opacification of the pulmonary arteries to the segmental level. No evidence of pulmonary embolism. Normal heart size. Aortic atherosclerosis. Coronary artery calcifications. No pericardial effusion. Mediastinum/Nodes: No enlarged mediastinal, hilar, or axillary lymph nodes. Thyroid gland, trachea, and esophagus demonstrate no significant findings. Lungs/Pleura: Moderate to advanced changes of centrilobular and paraseptal emphysema. Within the right upper lobe there is a solid-appearing nodule which measures 1.6 x 1.4 by 1.4 cm, image 32/3 Focal area of subpleural consolidation is noted within the posteromedial left base, image 74/7. Small left pleural effusion is present. Upper Abdomen: No acute abnormality. Musculoskeletal: No  chest wall abnormality. No acute or significant osseous  findings. Review of the MIP images confirms the above findings. IMPRESSION: 1. No evidence for acute pulmonary embolus. 2. Right upper lobe pulmonary nodule is identified measuring up to 1.6 cm. Suspicious for primary bronchogenic carcinoma. Further evaluation with PET-CT as warranted. 3. Subpleural consolidation in the posterior left base with small left pleural effusion. Favor inflammatory or infectious etiology. A 3 month, short-term interval follow-up CT of the chest is recommended to ensure resolution. This could also be readdressed at PET-CT. 4. Aortic Atherosclerosis (ICD10-I70.0) and Emphysema (ICD10-J43.9). 5. Coronary artery calcifications. Electronically Signed   By: Kerby Moors M.D.   On: 06/29/2020 09:59   ECHOCARDIOGRAM COMPLETE  Result Date: 06/29/2020    ECHOCARDIOGRAM REPORT   Patient Name:   ANDREA COLGLAZIER Date of Exam: 06/29/2020 Medical Rec #:  [id number]      Height:       73.0 in Accession #:    [id number]     Weight:       127.2 lb Date of Birth:  [birth year]     BSA:          1.775 m Patient Age:    46 years       BP:           159/83 mmHg Patient Gender: M              HR:           95 bpm. Exam Location:  Inpatient Procedure: 2D Echo, Cardiac Doppler and Color Doppler Indications:    I48.91* Unspeicified atrial fibrillation  History:        Patient has prior history of Echocardiogram examinations, most                 recent 07/10/2015. PAD. One day post BKA.  Sonographer:    Merrie Roof RDCS Referring Phys: [place]  1. Left ventricular ejection fraction, by estimation, is 60 to 65%. The left ventricle has normal function. The left ventricle has no regional wall motion abnormalities. Left ventricular diastolic parameters are indeterminate.  2. Right ventricular systolic function is normal. The right ventricular size is normal. There is normal pulmonary artery systolic pressure. The estimated right  ventricular systolic pressure is 19.3 mmHg.  3. Left atrial size was mildly dilated.  4. Right atrial size was mildly dilated.  5. The mitral valve is normal in structure. Mild mitral valve regurgitation. No evidence of mitral stenosis.  6. The aortic valve is normal in structure. Aortic valve regurgitation is not visualized. Mild to moderate aortic valve sclerosis/calcification is present, without any evidence of aortic stenosis.  7. The inferior vena cava is normal in size with greater than 50% respiratory variability, suggesting right atrial pressure of 3 mmHg. FINDINGS  Left Ventricle: Left ventricular ejection fraction, by estimation, is 60 to 65%. The left ventricle has normal function. The left ventricle has no regional wall motion abnormalities. The left ventricular internal cavity size was normal in size. There is  no left ventricular hypertrophy. Left ventricular diastolic parameters are indeterminate. Right Ventricle: The right ventricular size is normal. No increase in right ventricular wall thickness. Right ventricular systolic function is normal. There is normal pulmonary artery systolic pressure. The tricuspid regurgitant velocity is 2.66 m/s, and  with an assumed right atrial pressure of 3 mmHg, the estimated right ventricular systolic pressure is 79.0 mmHg. Left Atrium: Left atrial size was mildly dilated. Right Atrium: Right atrial size was mildly dilated. Pericardium: There is no evidence  of pericardial effusion. Mitral Valve: The mitral valve is normal in structure. Mild mitral valve regurgitation. No evidence of mitral valve stenosis. Tricuspid Valve: The tricuspid valve is normal in structure. Tricuspid valve regurgitation is mild . No evidence of tricuspid stenosis. Aortic Valve: The aortic valve is normal in structure. Aortic valve regurgitation is not visualized. Mild to moderate aortic valve sclerosis/calcification is present, without any evidence of aortic stenosis. Aortic valve mean  gradient measures 4.0 mmHg. Aortic valve peak gradient measures 7.8 mmHg. Aortic valve area, by VTI measures 2.01 cm. Pulmonic Valve: The pulmonic valve was normal in structure. Pulmonic valve regurgitation is not visualized. No evidence of pulmonic stenosis. Aorta: The aortic root is normal in size and structure. Venous: The inferior vena cava is normal in size with greater than 50% respiratory variability, suggesting right atrial pressure of 3 mmHg. IAS/Shunts: No atrial level shunt detected by color flow Doppler.  LEFT VENTRICLE PLAX 2D LVIDd:         5.10 cm LVIDs:         3.80 cm LV PW:         1.00 cm LV IVS:        1.00 cm LVOT diam:     2.00 cm LV SV:         54 LV SV Index:   30 LVOT Area:     3.14 cm  RIGHT VENTRICLE          IVC RV Basal diam:  3.90 cm  IVC diam: 1.60 cm LEFT ATRIUM             Index       RIGHT ATRIUM           Index LA diam:        3.80 cm 2.14 cm/m  RA Area:     20.00 cm LA Vol (A2C):   69.7 ml 39.26 ml/m RA Volume:   59.60 ml  33.57 ml/m LA Vol (A4C):   69.6 ml 39.21 ml/m LA Biplane Vol: 72.4 ml 40.78 ml/m  AORTIC VALVE AV Area (Vmax):    2.05 cm AV Area (Vmean):   2.12 cm AV Area (VTI):     2.01 cm AV Vmax:           140.00 cm/s AV Vmean:          92.800 cm/s AV VTI:            0.267 m AV Peak Grad:      7.8 mmHg AV Mean Grad:      4.0 mmHg LVOT Vmax:         91.40 cm/s LVOT Vmean:        62.500 cm/s LVOT VTI:          0.171 m LVOT/AV VTI ratio: 0.64  AORTA Ao Root diam: 3.60 cm TRICUSPID VALVE TR Peak grad:   28.3 mmHg TR Vmax:        266.00 cm/s  SHUNTS Systemic VTI:  0.17 m Systemic Diam: 2.00 cm Candee Furbish MD Electronically signed by Candee Furbish MD Signature Date/Time: 06/29/2020/5:23:45 PM    Final     Orson Eva, DO  [facility]  If 7PM-7AM, please contact night-coverage [email or web address] Password [email or web address] 07/24/2020, 1:02 PM   LOS: 3 days

## 2020-07-24 NOTE — Progress Notes (Signed)
Patient and belongings transferred via bed. Patient alert. Patient stable. No s/s of distress. Report and handoff of patient provided to [facility]. Spouse aware of transfer to [phone]

## 2020-07-25 LAB — CBC
HCT: 24.6 % — ABNORMAL LOW (ref 39.0–52.0)
Hemoglobin: 8.4 g/dL — ABNORMAL LOW (ref 13.0–17.0)
MCH: 31.2 pg (ref 26.0–34.0)
MCHC: 34.1 g/dL (ref 30.0–36.0)
MCV: 91.4 fL (ref 80.0–100.0)
Platelets: 81 10*3/uL — ABNORMAL LOW (ref 150–400)
RBC: 2.69 MIL/uL — ABNORMAL LOW (ref 4.22–5.81)
RDW: 15.7 % — ABNORMAL HIGH (ref 11.5–15.5)
WBC: 16.8 10*3/uL — ABNORMAL HIGH (ref 4.0–10.5)
nRBC: 0.1 % (ref 0.0–0.2)

## 2020-07-25 LAB — COMPREHENSIVE METABOLIC PANEL
ALT: 41 U/L (ref 0–44)
AST: 65 U/L — ABNORMAL HIGH (ref 15–41)
Albumin: 1.9 g/dL — ABNORMAL LOW (ref 3.5–5.0)
Alkaline Phosphatase: 193 U/L — ABNORMAL HIGH (ref 38–126)
Anion gap: 8 (ref 5–15)
BUN: 57 mg/dL — ABNORMAL HIGH (ref 8–23)
CO2: 21 mmol/L — ABNORMAL LOW (ref 22–32)
Calcium: 8 mg/dL — ABNORMAL LOW (ref 8.9–10.3)
Chloride: 103 mmol/L (ref 98–111)
Creatinine, Ser: 1.7 mg/dL — ABNORMAL HIGH (ref 0.61–1.24)
GFR, Estimated: 42 mL/min — ABNORMAL LOW (ref >60)
Glucose, Bld: 229 mg/dL — ABNORMAL HIGH (ref 70–99)
Potassium: 3.6 mmol/L (ref 3.5–5.1)
Sodium: 132 mmol/L — ABNORMAL LOW (ref 135–145)
Total Bilirubin: 0.6 mg/dL (ref 0.3–1.2)
Total Protein: 5.1 g/dL — ABNORMAL LOW (ref 6.5–8.1)

## 2020-07-25 LAB — URINE CULTURE: Culture: 80000 — AB

## 2020-07-25 LAB — GLUCOSE, CAPILLARY
Glucose-Capillary: 236 mg/dL — ABNORMAL HIGH (ref 70–99)
Glucose-Capillary: 293 mg/dL — ABNORMAL HIGH (ref 70–99)
Glucose-Capillary: 270 mg/dL — ABNORMAL HIGH (ref 70–99)
Glucose-Capillary: 151 mg/dL — ABNORMAL HIGH (ref 70–99)

## 2020-07-25 LAB — MAGNESIUM: Magnesium: 1.8 mg/dL (ref 1.7–2.4)

## 2020-07-25 MED ORDER — ENOXAPARIN SODIUM 40 MG/0.4ML IJ SOSY
40.0000 mg | PREFILLED_SYRINGE | INTRAMUSCULAR | Status: DC
  Filled 2020-07-25: qty 0.4

## 2020-07-25 NOTE — Progress Notes (Signed)
Pt admitted back to the floor due to pt stability. A&Ox2. Pt BP (!) 143/68 (BP Location: Left Arm)   Pulse 73   Temp 97.9 F (36.6 C) (Oral)   Resp 18   Ht 6\' 1"  (1.854 m)   Wt 44.5 kg   SpO2 (!) 86%   BMI 12.94 kg/m  Pt slightly confused. Wife updated about transfer to room. Pt placed on tele NSR. Call bell near by, bed in lowest postion, 2/4 bed rails up. No s/s of distress  Hal Neer.

## 2020-07-25 NOTE — Plan of Care (Signed)
  Problem: Education: Goal: Knowledge of General Education information will improve Description Including pain rating scale, medication(s)/side effects and non-pharmacologic comfort measures Outcome: Progressing   Problem: Health Behavior/Discharge Planning: Goal: Ability to manage health-related needs will improve Outcome: Progressing   

## 2020-07-25 NOTE — Progress Notes (Addendum)
PROGRESS NOTE  Allen Yu YCX:[id number] DOB: [birth year] DOA: 07/21/2020 PCP: [name], MD    Brief History:   75 y.o. 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  The patient is unable to give any significant history secondary to his altered mental status.  All of this history is obtained from review of medical record and speaking with the patient spouse.  Accordingly, the patient was recently discharged from the hospital after a stay from 06/28/2020 to 06/30/2020 when he underwent a left AKA on 06/28/2020 secondary to a poorly healing left heel ulceration.  His hospitalization was complicated by AKI as well as acute blood loss anemia.  He also had a self-limited episode of atrial fibrillation.  Anticoagulation was deferred secondary to his high fall risk and low A. fib burden.  Nevertheless, the patient's spouse states that he has had apathy and has been less communicative over the past week.  She states that he has quit taking all his medications which at baseline he normally is able to dispense to himself.  She has noted that he has been less talkative and more agitated.   She was concerned about a possible UTI and contacted the patient's PCP.  Apparently a urine sample was sent to [place] on 07/19/2020.  Results are unknown at this time.  Patient was not started on any antibiotics.  However, the patient was prescribed tramadol on 07/18/2020 by his PCP.  He quit taking his Percocet that was prescribed at the time of discharge from the hospital from his last admission because it was causing constipation.  There is been no other new medications. In the emergency department, the patient was afebrile and hemodynamically stable.  He was tachycardic initially in the 140s.  Oxygen saturation  was 100% on room air.  BMP shows sodium 129, potassium 4.3, serum creatinine 2.37.  LFTs were unremarkable.  WBC 22.3, hemoglobin 9.8, platelets 279,000.  Lactic acid 1.6.  CT of the brain was negative for acute findings.  Chest x-ray showed hyperinflation.  The patient was admitted secondary to altered mental status and AKI with SIRS criteria.   Since admission, the patient was empirically started on vanco and cefepime after blood and urine cultures were obtained.  Blood cultures grew Klebsiella pneumoniae.  He was also noted to have 06/29/20 urine culture with Klebsiella pneumonia.  His antibiotics were narrowed to ceftriaxone.  CT of the abdomen showed diffuse urinary bladder wall emphysema with intraluminal air  and extension of air outside bladder wall with superior dissection along anterior peritoneal wall and pelvic side wall.   Assessment/Plan: Sepsis -Patient presented with tachycardia and leukocytosis -Blood cultures= Klebsiella pneumoniae -urine culture = Klebsiella -Chest x-ray negative for infiltrates -Started empiric vancomycin and cefepime>>>narrowed to ceftriaxone -PCT 3.72   Emphysematous Cystitis -urine culture pending, but likely to be Klebsiella -CT abd as discussed above -case discussed with urology, Dr. Lillia Dallas foley, repeat CT abd/pelvis if no improvment   Klebsiella Bacteremia -source is urine -continue ceftriaxone   Paroxysmal Atrial Fibrillation with RVR -developed evening 6/30 -continue amiodarone for now>>sinus -07/24/20--d/c amiodarone drip now that sepsis physiology improved -CHADS-VASc= 4, but not clear if he is good AC candidate long term -06/29/20 Echo--EF 60-65%, no WMA, normal PASP -stopped amio drip 07/24/20 pm-->remains in sinus -continue metoprolol  Acute metabolic encephalopathy -Secondary to AKI and sepsis --Y86--578 -ION--6.295 -Folic MWUX--3.2 -improving, now near baseline   Acute on chronic renal failure--CKD stage IIIa -Baseline  creatinine 0.9-1.2 -serum creatine peaked 2.54 -Secondary to volume depletion and sepsis -Continue IV fluids   Hydronephrosis -due to emphysematous cystitis/Pyelonephritis -monitior clinically -foley placed>>800cc out   Hyponatremia -Secondary to volume depletion and poor solute intake -Start IV fluids>>improving   Uncontrolled diabetes mellitus type 2 with hyperglycemia -05/19/2020 hemoglobin A1c 9.2 -NovoLog sliding scale -lantus 10 units daily>>increase to 15 units -7/3 A1C--8.7   Essential hypertension -Holding losartan secondary to AKI -Restart metoprolol>>convert to IV as patient is refusing po meds   Hyperlipidemia -Restart statin   Dementia -Restart Aricept   Hypokalemia -replete -check mag--1.8 -add KCL to IVF         Status is: Inpatient   Remains inpatient appropriate because:IV treatments appropriate due to intensity of illness or inability to take PO   Dispo: The patient is from: Home              Anticipated d/c is to: Home              Patient currently is not medically stable to d/c.              Difficult to place patient No               Family Communication:   spouse updatd 7/4 Consultants:  cardiology   Code Status:  FULL   DVT Prophylaxis: Eleele Lovenox     Procedures: As Listed in Progress Note Above   Antibiotics: Ceftriaxone 7/1>> -vanc/cefepime 6/30>>7/1            Subjective: Patient denies fevers, chills, headache, chest pain, dyspnea, nausea, vomiting, diarrhea, abdominal pain, dysuria, hematuria, hematochezia, and melena.   Objective: Vitals:   07/24/20 2212 07/25/20 0212 07/25/20 0611 07/25/20 1343  BP: (!) 143/68 133/65 130/83 (!) 141/88  Pulse: 73 65 70 68  Resp: 18 18 18 16   Temp: 97.9 F (36.6 C) 97.8 F (36.6 C) (!) 97.4 F (36.3 C) 98.2 F (36.8 C)  TempSrc: Oral Oral Oral   SpO2: (!) 86% 99% 100% 100%  Weight:      Height:        Intake/Output Summary (Last 24 hours) at 07/25/2020 1606 Last  data filed at 07/25/2020 1300 Gross per 24 hour  Intake 1832.82 ml  Output 1600 ml  Net 232.82 ml   Weight change:  Exam:  General:  Pt is alert, follows commands appropriately, not in acute distress HEENT: No icterus, No thrush, No neck mass, [place]/AT Cardiovascular: RRR, S1/S2, no rubs, no gallops Respiratory: CTA bilaterally, no wheezing, no crackles, no rhonchi Abdomen: Soft/+BS, non tender, non distended, no guarding Extremities: No edema, No lymphangitis, No petechiae, No rashes, no synovitis   Data Reviewed: I have personally reviewed following labs and imaging studies Basic Metabolic Panel: Recent Labs  Lab 07/21/20 1055 07/22/20 0438 07/24/20 0328 07/25/20 0635  NA 129* 132* 130* 132*  K 4.3 4.6 3.0* 3.6  CL 94* 96* 99 103  CO2 25 24 21* 21*  GLUCOSE 350* 344* 482* 229*  BUN 82* 88* 70* 57*  CREATININE 2.37* 2.54* 1.85* 1.70*  CALCIUM 9.4 8.8* 7.7* 8.0*  MG  --   --   --  1.8   Liver Function Tests: Recent Labs  Lab 07/21/20 1055 07/22/20 0438 07/25/20 0635  AST 17 18 65*  ALT 16 15 41  ALKPHOS  117 103 193*  BILITOT 1.4* 1.3* 0.6  PROT 6.9 6.0* 5.1*  ALBUMIN 3.4* 2.7* 1.9*   Recent Labs  Lab 07/21/20 1723  LIPASE 19   No results for input(s): AMMONIA in the last 168 hours. Coagulation Profile: No results for input(s): INR, PROTIME in the last 168 hours. CBC: Recent Labs  Lab 07/21/20 1055 07/22/20 0438 07/24/20 0328 07/25/20 0635  WBC 22.3* 22.7* 22.4* 16.8*  NEUTROABS 19.8*  --   --   --   HGB 9.8* 9.0* 7.4* 8.4*  HCT 29.8* 27.3* 22.1* 24.6*  MCV 95.2 94.8 92.5 91.4  PLT 279 178 109* 81*   Cardiac Enzymes: No results for input(s): CKTOTAL, CKMB, CKMBINDEX, TROPONINI in the last 168 hours. BNP: Invalid input(s): POCBNP CBG: Recent Labs  Lab 07/24/20 1708 07/24/20 2102 07/24/20 2222 07/25/20 0728 07/25/20 1117  GLUCAP 203* 143* 143* 236* 293*   HbA1C: Recent Labs    07/24/20 0328  HGBA1C 8.7*   Urine analysis:     Component Value Date/Time   COLORURINE RED (A) 07/22/2020 1350   APPEARANCEUR CLOUDY (A) 07/22/2020 1350   LABSPEC 1.013 07/22/2020 1350   PHURINE 7.0 07/22/2020 1350   GLUCOSEU 50 (A) 07/22/2020 1350   HGBUR LARGE (A) 07/22/2020 1350   BILIRUBINUR NEGATIVE 07/22/2020 1350   KETONESUR 5 (A) 07/22/2020 1350   PROTEINUR 100 (A) 07/22/2020 1350   UROBILINOGEN 0.2 02/18/2014 0950   NITRITE NEGATIVE 07/22/2020 1350   LEUKOCYTESUR MODERATE (A) 07/22/2020 1350   Sepsis Labs: @LABRCNTIP (procalcitonin:4,lacticidven:4) ) Recent Results (from the past 240 hour(s))  Culture, blood (routine x 2)     Status: Abnormal   Collection Time: 07/21/20 12:43 PM   Specimen: BLOOD  Result Value Ref Range Status   Specimen Description   Final    BLOOD LEFT ARM Performed at [facility], [address] [place], [place] [address]    Special Requests   Final    BOTTLES DRAWN AEROBIC AND ANAEROBIC Blood Culture adequate volume Performed at [facility], [address], [place], [place] [address]    Culture  Setup Time   Final    IN BOTH AEROBIC AND ANAEROBIC BOTTLES GRAM NEGATIVE RODS Gram Stain Report Called to,Read Back By and Verified With: PEACH,RN@0222  07/22/20 MKELLY CRITICAL RESULT CALLED TO, READ BACK BY AND VERIFIED WITH: S,HALL @1038  07/22/20 DV Performed at [facility], [place] [address] [place], [place] [address]    Culture KLEBSIELLA PNEUMONIAE (A)  Final   Report Status 07/24/2020 FINAL  Final   Organism ID, Bacteria KLEBSIELLA PNEUMONIAE  Final      Susceptibility   Klebsiella pneumoniae - MIC*    AMPICILLIN >=32 RESISTANT Resistant     CEFAZOLIN <=4 SENSITIVE Sensitive     CEFEPIME <=0.12 SENSITIVE Sensitive     CEFTAZIDIME <=1 SENSITIVE Sensitive     CEFTRIAXONE <=0.25 SENSITIVE Sensitive     CIPROFLOXACIN <=0.25 SENSITIVE Sensitive     GENTAMICIN <=1 SENSITIVE Sensitive     IMIPENEM <=0.25 SENSITIVE Sensitive     TRIMETH/SULFA <=20 SENSITIVE Sensitive      AMPICILLIN/SULBACTAM >=32 RESISTANT Resistant     PIP/TAZO 16 SENSITIVE Sensitive     * KLEBSIELLA PNEUMONIAE  Culture, blood (routine x 2)     Status: Abnormal   Collection Time: 07/21/20 12:43 PM   Specimen: BLOOD  Result Value Ref Range Status   Specimen Description   Final    BLOOD LEFT HAND Performed at [facility], [address] [place],  [address]  Special Requests   Final    BOTTLES DRAWN AEROBIC ONLY Blood Culture results may not be optimal due to an inadequate volume of blood received in culture bottles Performed at [facility], [address], [place], [place] [address]    Culture  Setup Time   Final    AEROBIC BOTTLE ONLY GRAM NEGATIVE RODS Gram Stain Report Called to,Read Back By and Verified With: PEACH,[email or web address]  07/22/20 [facility] Performed at [facility], [address], [place], [place] [address]    Culture (A)  Final    KLEBSIELLA PNEUMONIAE SUSCEPTIBILITIES PERFORMED ON PREVIOUS CULTURE WITHIN THE LAST 5 DAYS. Performed at [facility], [place] [address] [place], [place] [address]    Report Status 07/24/2020 FINAL  Final  Blood Culture ID Panel (Reflexed)     Status: Abnormal   Collection Time: 07/21/20 12:43 PM  Result Value Ref Range Status   Enterococcus faecalis NOT DETECTED NOT DETECTED Final   Enterococcus Faecium NOT DETECTED NOT DETECTED Final   Listeria monocytogenes NOT DETECTED NOT DETECTED Final   Staphylococcus species NOT DETECTED NOT DETECTED Final   Staphylococcus aureus (BCID) NOT DETECTED NOT DETECTED Final   Staphylococcus epidermidis NOT DETECTED NOT DETECTED Final   Staphylococcus lugdunensis NOT DETECTED NOT DETECTED Final   Streptococcus species NOT DETECTED NOT DETECTED Final   Streptococcus agalactiae NOT DETECTED NOT DETECTED Final   Streptococcus pneumoniae NOT DETECTED NOT DETECTED Final   Streptococcus pyogenes NOT DETECTED NOT DETECTED Final   A.calcoaceticus-baumannii NOT DETECTED NOT DETECTED Final   Bacteroides  fragilis NOT DETECTED NOT DETECTED Final   Enterobacterales DETECTED (A) NOT DETECTED Final    Comment: Enterobacterales represent a large order of gram negative bacteria, not a single organism. CRITICAL RESULT CALLED TO, READ BACK BY AND VERIFIED WITH: S. HALL PHARMD, AT 1038 07/22/20 D. VANHOOK    Enterobacter cloacae complex NOT DETECTED NOT DETECTED Final   Escherichia coli NOT DETECTED NOT DETECTED Final   Klebsiella aerogenes NOT DETECTED NOT DETECTED Final   Klebsiella oxytoca NOT DETECTED NOT DETECTED Final   Klebsiella pneumoniae DETECTED (A) NOT DETECTED Final    Comment: CRITICAL RESULT CALLED TO, READ BACK BY AND VERIFIED WITH: S. HALL PHARMD, AT 1038 07/22/20 D. VANHOOK    Proteus species NOT DETECTED NOT DETECTED Final   Salmonella species NOT DETECTED NOT DETECTED Final   Serratia marcescens NOT DETECTED NOT DETECTED Final   Haemophilus influenzae NOT DETECTED NOT DETECTED Final   Neisseria meningitidis NOT DETECTED NOT DETECTED Final   Pseudomonas aeruginosa NOT DETECTED NOT DETECTED Final   Stenotrophomonas maltophilia NOT DETECTED NOT DETECTED Final   Candida albicans NOT DETECTED NOT DETECTED Final   Candida auris NOT DETECTED NOT DETECTED Final   Candida glabrata NOT DETECTED NOT DETECTED Final   Candida krusei NOT DETECTED NOT DETECTED Final   Candida parapsilosis NOT DETECTED NOT DETECTED Final   Candida tropicalis NOT DETECTED NOT DETECTED Final   Cryptococcus neoformans/gattii NOT DETECTED NOT DETECTED Final   CTX-M ESBL NOT DETECTED NOT DETECTED Final   Carbapenem resistance IMP NOT DETECTED NOT DETECTED Final   Carbapenem resistance KPC NOT DETECTED NOT DETECTED Final   Carbapenem resistance NDM NOT DETECTED NOT DETECTED Final   Carbapenem resist OXA 48 LIKE NOT DETECTED NOT DETECTED Final   Carbapenem resistance VIM NOT DETECTED NOT DETECTED Final    Comment: Performed at [facility], [address] [place], [place] [address]  MRSA Next Gen by PCR,  Nasal  Status: None   Collection Time: 07/21/20 11:56 PM   Specimen: Nasal Mucosa; Nasal Swab  Result Value Ref Range Status   MRSA by PCR Next Gen NOT DETECTED NOT DETECTED Final    Comment: (NOTE) The GeneXpert MRSA Assay (FDA approved for NASAL specimens only), is one component of a comprehensive MRSA colonization surveillance program. It is not intended to diagnose MRSA infection nor to guide or monitor treatment for MRSA infections. Test performance is not FDA approved in patients less than 54 years old. Performed at [facility], [address], [place], [place] [address]   Urine culture     Status: Abnormal   Collection Time: 07/22/20  1:50 PM   Specimen: Urine, Clean Catch  Result Value Ref Range Status   Specimen Description   Final    URINE, CLEAN CATCH Performed at [facility], [address] [place], [place] [address]    Special Requests   Final    NONE Performed at [facility], [address], [place], [place] [address]    Culture 80,000 COLONIES/mL KLEBSIELLA PNEUMONIAE (A)  Final   Report Status 07/25/2020 FINAL  Final   Organism ID, Bacteria KLEBSIELLA PNEUMONIAE (A)  Final      Susceptibility   Klebsiella pneumoniae - MIC*    AMPICILLIN >=32 RESISTANT Resistant     CEFAZOLIN <=4 SENSITIVE Sensitive     CEFEPIME <=0.12 SENSITIVE Sensitive     CEFTRIAXONE <=0.25 SENSITIVE Sensitive     CIPROFLOXACIN <=0.25 SENSITIVE Sensitive     GENTAMICIN <=1 SENSITIVE Sensitive     IMIPENEM <=0.25 SENSITIVE Sensitive     NITROFURANTOIN 128 RESISTANT Resistant     TRIMETH/SULFA <=20 SENSITIVE Sensitive     AMPICILLIN/SULBACTAM >=32 RESISTANT Resistant     PIP/TAZO 32 INTERMEDIATE Intermediate     * 80,000 COLONIES/mL KLEBSIELLA PNEUMONIAE     Scheduled Meds:  atorvastatin  40 mg Oral QHS   Chlorhexidine Gluconate Cloth  6 each Topical Daily   donepezil  10 mg Oral QHS   [START ON 07/26/2020] enoxaparin (LOVENOX) injection  40 mg Subcutaneous Q24H   feeding supplement  (GLUCERNA SHAKE)  237 mL Oral TID BM   insulin aspart  0-15 Units Subcutaneous TID WC   insulin aspart  0-5 Units Subcutaneous QHS   insulin glargine  15 Units Subcutaneous Daily   metoprolol tartrate  25 mg Oral BID   multivitamin with minerals  1 tablet Oral Daily   Continuous Infusions:  0.9 % NaCl with KCl 20 mEq / L 100 mL/hr at 07/25/20 1247   amiodarone Stopped (07/24/20 1518)   cefTRIAXone (ROCEPHIN)  IV 2 g (07/25/20 1150)    Procedures/Studies: CT ABDOMEN PELVIS WO CONTRAST  Result Date: 07/21/2020 CLINICAL DATA:  75 year old male with acute abdominal pain. EXAM: CT ABDOMEN AND PELVIS WITHOUT CONTRAST TECHNIQUE: Multidetector CT imaging of the abdomen and pelvis was performed following the standard protocol without IV contrast. COMPARISON:  CT abdomen pelvis dated 07/09/2016. FINDINGS: Evaluation of this exam is limited in the absence of intravenous contrast. Evaluation is also limited due to streak artifact caused by patient's arms and loss of intra-abdominal fat and cachexia as well as anasarca. Lower chest: The visualized lung bases are clear. There is coronary vascular calcification. Hepatobiliary: The liver is grossly unremarkable. No intrahepatic biliary dilatation. Cholecystectomy. Pancreas: The pancreas is poorly visualized. Spleen: The spleen is grossly unremarkable. Adrenals/Urinary Tract: Mild bilateral adrenal thickening. There is mild bilateral hydronephrosis. Probable bilateral renal vascular calcification. The urinary bladder is distended.  There is diffuse bladder wall emphysema with large amount of intraluminal air. Findings consistent with emphysematous cystitis correlation with clinical exam and urinalysis recommended. There is extension of the air outside of the bladder wall and superior dissection along the fat planes of the anterior peritoneal wall and bilateral flank and pelvic sidewall. Clinical correlation recommended to evaluate for possibility of sepsis and  peritonitis. Stomach/Bowel: No evidence of bowel obstruction. Evaluation of the bowel is however is limited due to factors above. Vascular/Lymphatic: Advanced aortoiliac atherosclerotic disease. Evaluation of the vasculature is very limited on this CT. Reproductive: The prostate gland is poorly visualized. There is air within the urethra. Other: There is loss of subcutaneous and intra-abdominal fat and cachexia. Musculoskeletal: Severe osteopenia with degenerative changes of the spine. L4-S1 posterior fusion. No acute osseous pathology. IMPRESSION: 1. Emphysematous cystitis with extension of air along the anterior peritoneal and lateral pelvic sidewalls. Clinical correlation is recommended to evaluate for possibility of sepsis and peritonitis. There is air within the urethra. 2. Mild bilateral hydronephrosis. 3. No bowel obstruction. 4. Aortic Atherosclerosis (ICD10-I70.0) and Emphysema (ICD10-J43.9). These results were called by telephone at the time of interpretation on 07/21/2020 at 8:36 pm to the patient's nurse, Panther, who verbally acknowledged these results. Electronically Signed   By: Anner Crete M.D.   On: 07/21/2020 20:39   DG Chest 1 View  Result Date: 07/21/2020 CLINICAL DATA:  75 year old male with confusion and weakness. Recent UTI. EXAM: CHEST  1 VIEW COMPARISON:  Chest CTA 06/29/2020 and earlier. FINDINGS: Portable AP upright view at 1019 hours. Stable lung volumes and mediastinal contours. Emphysema demonstrated on the prior CTA. The mildly spiculated right upper lobe pulmonary nodule seen by CT is poorly visible with plain radiograph technique. Allowing for portable technique the lungs are clear. Visualized tracheal air column is within normal limits. Prior cervical ACDF. No acute osseous abnormality identified. Negative visible bowel gas pattern. IMPRESSION: 1. The suspicious right upper lobe pulmonary nodule seen by CT 06/29/2020 is occult by portable x-ray. 2. Emphysema.  No new  cardiopulmonary abnormality. Electronically Signed   By: Genevie Ann M.D.   On: 07/21/2020 11:01   CT Head Wo Contrast  Result Date: 07/21/2020 CLINICAL DATA:  75 year old male with confusion, agitation, altered mental status. EXAM: CT HEAD WITHOUT CONTRAST TECHNIQUE: Contiguous axial images were obtained from the base of the skull through the vertex without intravenous contrast. COMPARISON:  Brain MRI 08/27/2018.  Head CT 07/08/2015. FINDINGS: Brain: Mild cerebral volume loss since 2017 appears to be generalized. No midline shift, ventriculomegaly, mass effect, evidence of mass lesion, intracranial hemorrhage or evidence of cortically based acute infarction. Patchy and confluent bilateral cerebral white matter hypodensity has progressed since 2017 but appears stable to the 2020 MRI. Bilateral basal ganglia vascular calcifications. No cortical encephalomalacia. Vascular: Calcified atherosclerosis at the skull base. No suspicious intracranial vascular hyperdensity. Skull: Mild chronic right lamina papyracea fracture. Otherwise negative. No acute osseous abnormality identified. Sinuses/Orbits: Visualized paranasal sinuses and mastoids are stable and well aerated. Other: Visualized orbits and scalp soft tissues are within normal limits. IMPRESSION: 1. No acute intracranial abnormality. 2. Chronic small vessel disease appears stable since a 2020 MRI. Electronically Signed   By: Genevie Ann M.D.   On: 07/21/2020 11:05   CT Angio Chest Pulmonary Embolism (PE) W or WO Contrast  Result Date: 06/29/2020 CLINICAL DATA:  Rule out pulmonary embolus.  Elevated D-dimer EXAM: CT ANGIOGRAPHY CHEST WITH CONTRAST TECHNIQUE: Multidetector CT imaging of the chest was performed using the  standard protocol during bolus administration of intravenous contrast. Multiplanar CT image reconstructions and MIPs were obtained to evaluate the vascular anatomy. CONTRAST:  45mL OMNIPAQUE IOHEXOL 350 MG/ML SOLN COMPARISON:  CT chest 06/30/2019  FINDINGS: Cardiovascular: Satisfactory opacification of the pulmonary arteries to the segmental level. No evidence of pulmonary embolism. Normal heart size. Aortic atherosclerosis. Coronary artery calcifications. No pericardial effusion. Mediastinum/Nodes: No enlarged mediastinal, hilar, or axillary lymph nodes. Thyroid gland, trachea, and esophagus demonstrate no significant findings. Lungs/Pleura: Moderate to advanced changes of centrilobular and paraseptal emphysema. Within the right upper lobe there is a solid-appearing nodule which measures 1.6 x 1.4 by 1.4 cm, image 32/3 Focal area of subpleural consolidation is noted within the posteromedial left base, image 74/7. Small left pleural effusion is present. Upper Abdomen: No acute abnormality. Musculoskeletal: No chest wall abnormality. No acute or significant osseous findings. Review of the MIP images confirms the above findings. IMPRESSION: 1. No evidence for acute pulmonary embolus. 2. Right upper lobe pulmonary nodule is identified measuring up to 1.6 cm. Suspicious for primary bronchogenic carcinoma. Further evaluation with PET-CT as warranted. 3. Subpleural consolidation in the posterior left base with small left pleural effusion. Favor inflammatory or infectious etiology. A 3 month, short-term interval follow-up CT of the chest is recommended to ensure resolution. This could also be readdressed at PET-CT. 4. Aortic Atherosclerosis (ICD10-I70.0) and Emphysema (ICD10-J43.9). 5. Coronary artery calcifications. Electronically Signed   By: Kerby Moors M.D.   On: 06/29/2020 09:59   ECHOCARDIOGRAM COMPLETE  Result Date: 06/29/2020    ECHOCARDIOGRAM REPORT   Patient Name:   CHEVON LAUFER Date of Exam: 06/29/2020 Medical Rec #:  [id number]      Height:       73.0 in Accession #:    [id number]     Weight:       127.2 lb Date of Birth:  [birth year]     BSA:          1.775 m Patient Age:    20 years       BP:           159/83 mmHg Patient Gender: M               HR:           95 bpm. Exam Location:  Inpatient Procedure: 2D Echo, Cardiac Doppler and Color Doppler Indications:    I48.91* Unspeicified atrial fibrillation  History:        Patient has prior history of Echocardiogram examinations, most                 recent 07/10/2015. PAD. One day post BKA.  Sonographer:    Merrie Roof RDCS Referring Phys: [place]  1. Left ventricular ejection fraction, by estimation, is 60 to 65%. The left ventricle has normal function. The left ventricle has no regional wall motion abnormalities. Left ventricular diastolic parameters are indeterminate.  2. Right ventricular systolic function is normal. The right ventricular size is normal. There is normal pulmonary artery systolic pressure. The estimated right ventricular systolic pressure is 29.5 mmHg.  3. Left atrial size was mildly dilated.  4. Right atrial size was mildly dilated.  5. The mitral valve is normal in structure. Mild mitral valve regurgitation. No evidence of mitral stenosis.  6. The aortic valve is normal in structure. Aortic valve regurgitation is not visualized. Mild to moderate aortic valve sclerosis/calcification is present, without any evidence of aortic stenosis.  7. The inferior vena  cava is normal in size with greater than 50% respiratory variability, suggesting right atrial pressure of 3 mmHg. FINDINGS  Left Ventricle: Left ventricular ejection fraction, by estimation, is 60 to 65%. The left ventricle has normal function. The left ventricle has no regional wall motion abnormalities. The left ventricular internal cavity size was normal in size. There is  no left ventricular hypertrophy. Left ventricular diastolic parameters are indeterminate. Right Ventricle: The right ventricular size is normal. No increase in right ventricular wall thickness. Right ventricular systolic function is normal. There is normal pulmonary artery systolic pressure. The tricuspid regurgitant velocity is 2.66 m/s,  and  with an assumed right atrial pressure of 3 mmHg, the estimated right ventricular systolic pressure is 96.7 mmHg. Left Atrium: Left atrial size was mildly dilated. Right Atrium: Right atrial size was mildly dilated. Pericardium: There is no evidence of pericardial effusion. Mitral Valve: The mitral valve is normal in structure. Mild mitral valve regurgitation. No evidence of mitral valve stenosis. Tricuspid Valve: The tricuspid valve is normal in structure. Tricuspid valve regurgitation is mild . No evidence of tricuspid stenosis. Aortic Valve: The aortic valve is normal in structure. Aortic valve regurgitation is not visualized. Mild to moderate aortic valve sclerosis/calcification is present, without any evidence of aortic stenosis. Aortic valve mean gradient measures 4.0 mmHg. Aortic valve peak gradient measures 7.8 mmHg. Aortic valve area, by VTI measures 2.01 cm. Pulmonic Valve: The pulmonic valve was normal in structure. Pulmonic valve regurgitation is not visualized. No evidence of pulmonic stenosis. Aorta: The aortic root is normal in size and structure. Venous: The inferior vena cava is normal in size with greater than 50% respiratory variability, suggesting right atrial pressure of 3 mmHg. IAS/Shunts: No atrial level shunt detected by color flow Doppler.  LEFT VENTRICLE PLAX 2D LVIDd:         5.10 cm LVIDs:         3.80 cm LV PW:         1.00 cm LV IVS:        1.00 cm LVOT diam:     2.00 cm LV SV:         54 LV SV Index:   30 LVOT Area:     3.14 cm  RIGHT VENTRICLE          IVC RV Basal diam:  3.90 cm  IVC diam: 1.60 cm LEFT ATRIUM             Index       RIGHT ATRIUM           Index LA diam:        3.80 cm 2.14 cm/m  RA Area:     20.00 cm LA Vol (A2C):   69.7 ml 39.26 ml/m RA Volume:   59.60 ml  33.57 ml/m LA Vol (A4C):   69.6 ml 39.21 ml/m LA Biplane Vol: 72.4 ml 40.78 ml/m  AORTIC VALVE AV Area (Vmax):    2.05 cm AV Area (Vmean):   2.12 cm AV Area (VTI):     2.01 cm AV Vmax:            140.00 cm/s AV Vmean:          92.800 cm/s AV VTI:            0.267 m AV Peak Grad:      7.8 mmHg AV Mean Grad:      4.0 mmHg LVOT Vmax:         91.40  cm/s LVOT Vmean:        62.500 cm/s LVOT VTI:          0.171 m LVOT/AV VTI ratio: 0.64  AORTA Ao Root diam: 3.60 cm TRICUSPID VALVE TR Peak grad:   28.3 mmHg TR Vmax:        266.00 cm/s  SHUNTS Systemic VTI:  0.17 m Systemic Diam: 2.00 cm Candee Furbish MD Electronically signed by Candee Furbish MD Signature Date/Time: 06/29/2020/5:23:45 PM    Final     Orson Eva, DO  [facility]  If 7PM-7AM, please contact night-coverage [email or web address] Password [email or web address] 07/25/2020, 4:06 PM   LOS: 4 days

## 2020-07-25 NOTE — Progress Notes (Deleted)
ANTICOAGULATION CONSULT NOTE - Initial Consult  Pharmacy Consult for Lovenox  Indication: atrial fibrillation  Allergies  Allergen Reactions   Bee Venom Anaphylaxis   Codeine Other (See Comments)    incoherent    Diovan [Valsartan] Other (See Comments)    incoherent   Propoxyphene Other (See Comments)    Dizziness, "Makes me feel drunk"    Patient Measurements: Height: 6\' 1"  (185.4 cm) Weight: 44.5 kg (98 lb 1.7 oz) IBW/kg (Calculated) : 79.9   Vital Signs: Temp: 97.4 F (36.3 C) (07/04 0611) Temp Source: Oral (07/04 0611) BP: 130/83 (07/04 0611) Pulse Rate: 70 (07/04 0611)  Labs: Recent Labs    07/24/20 0328 07/25/20 0635  HGB 7.4* 8.4*  HCT 22.1* 24.6*  PLT 109* 81*  CREATININE 1.85* 1.70*     Estimated Creatinine Clearance: 24 mL/min (A) (by C-G formula based on SCr of 1.7 mg/dL (H)).   Medical History: Past Medical History:  Diagnosis Date   Anemia    Arthritis    Carotid stenosis    Chronic back pain    Chronic kidney disease    Constipation    Dementia (HCC)    Diabetes mellitus    GERD (gastroesophageal reflux disease)    History of kidney stones    Hypertension    Lung nodule    PAF (paroxysmal atrial fibrillation) (HCC)    Peripheral neuropathy    Peripheral vascular disease (HCC)    PTSD (post-traumatic stress disorder)    Schizophrenia (HCC)    Tuberculosis    Treated    Assessment: 75 y/o M with altered mental status, now going in and out of afib, starting Lovenox per pharmacy, renal function requiring dosing adjustment to q24h frequency. Hgb 9.8 to 7.4 then 8.4. Plt 180 to 81, Dr Tat has been notified of each on 7/3 and 7/4 respectfully.   Goal of Therapy:  Monitor platelets by anticoagulation protocol: Yes   Plan:  Lovenox 60 mg subcutaneous q24h Daily CBC Monitor for bleeding  Donna Christen Giacomo Valone, PharmD, MBA, BCGP Clinical Pharmacist

## 2020-07-25 NOTE — Progress Notes (Signed)
ANTICOAGULATION CONSULT NOTE - Initial Consult  Pharmacy Consult for Lovenox  Indication: atrial fibrillation  Allergies  Allergen Reactions   Bee Venom Anaphylaxis   Codeine Other (See Comments)    incoherent    Diovan [Valsartan] Other (See Comments)    incoherent   Propoxyphene Other (See Comments)    Dizziness, "Makes me feel drunk"    Patient Measurements: Height: 6\' 1"  (185.4 cm) Weight: 44.5 kg (98 lb 1.7 oz) IBW/kg (Calculated) : 79.9   Vital Signs: Temp: 97.4 F (36.3 C) (07/04 0611) Temp Source: Oral (07/04 0611) BP: 130/83 (07/04 0611) Pulse Rate: 70 (07/04 0611)  Labs: Recent Labs    07/24/20 0328 07/25/20 0635  HGB 7.4* 8.4*  HCT 22.1* 24.6*  PLT 109* 81*  CREATININE 1.85* 1.70*     Estimated Creatinine Clearance: 24 mL/min (A) (by C-G formula based on SCr of 1.7 mg/dL (H)).   Medical History: Past Medical History:  Diagnosis Date   Anemia    Arthritis    Carotid stenosis    Chronic back pain    Chronic kidney disease    Constipation    Dementia (HCC)    Diabetes mellitus    GERD (gastroesophageal reflux disease)    History of kidney stones    Hypertension    Lung nodule    PAF (paroxysmal atrial fibrillation) (HCC)    Peripheral neuropathy    Peripheral vascular disease (HCC)    PTSD (post-traumatic stress disorder)    Schizophrenia (HCC)    Tuberculosis    Treated    Assessment: 75 y/o M with altered mental status, now going in and out of afib, starting Lovenox per pharmacy, renal function requiring dosing adjustment to q24h frequency. Hgb 9.8 to 7.4 then 8.4. Plt 180 to 81, Dr Tat has been notified of each on 7/3 and 7/4 respectfully.   Goal of Therapy:  Monitor platelets by anticoagulation protocol: Yes   Plan:  Lovenox 40 mg subcutaneous q24h Daily CBC Monitor for bleeding  Donna Christen Allen Yu, PharmD, MBA, BCGP Clinical Pharmacist

## 2020-07-26 LAB — BASIC METABOLIC PANEL
Anion gap: 6 (ref 5–15)
BUN: 50 mg/dL — ABNORMAL HIGH (ref 8–23)
CO2: 22 mmol/L (ref 22–32)
Calcium: 8.2 mg/dL — ABNORMAL LOW (ref 8.9–10.3)
Chloride: 108 mmol/L (ref 98–111)
Creatinine, Ser: 1.57 mg/dL — ABNORMAL HIGH (ref 0.61–1.24)
GFR, Estimated: 46 mL/min — ABNORMAL LOW (ref >60)
Glucose, Bld: 92 mg/dL (ref 70–99)
Potassium: 3.6 mmol/L (ref 3.5–5.1)
Sodium: 136 mmol/L (ref 135–145)

## 2020-07-26 LAB — CBC
HCT: 22.6 % — ABNORMAL LOW (ref 39.0–52.0)
Hemoglobin: 7.5 g/dL — ABNORMAL LOW (ref 13.0–17.0)
MCH: 30.9 pg (ref 26.0–34.0)
MCHC: 33.2 g/dL (ref 30.0–36.0)
MCV: 93 fL (ref 80.0–100.0)
Platelets: 81 10*3/uL — ABNORMAL LOW (ref 150–400)
RBC: 2.43 MIL/uL — ABNORMAL LOW (ref 4.22–5.81)
RDW: 16.4 % — ABNORMAL HIGH (ref 11.5–15.5)
WBC: 15.3 10*3/uL — ABNORMAL HIGH (ref 4.0–10.5)
nRBC: 0 % (ref 0.0–0.2)

## 2020-07-26 LAB — GLUCOSE, CAPILLARY
Glucose-Capillary: 102 mg/dL — ABNORMAL HIGH (ref 70–99)
Glucose-Capillary: 160 mg/dL — ABNORMAL HIGH (ref 70–99)

## 2020-07-26 LAB — MAGNESIUM: Magnesium: 1.8 mg/dL (ref 1.7–2.4)

## 2020-07-26 MED ORDER — CEFAZOLIN SODIUM-DEXTROSE 2-4 GM/100ML-% IV SOLN: 2.0000 g | Freq: Two times a day (BID) | INTRAVENOUS | Status: DC

## 2020-07-26 MED ORDER — TAMSULOSIN HCL 0.4 MG PO CAPS: 0.4000 mg | ORAL_CAPSULE | Freq: Every day | ORAL | Status: DC

## 2020-07-26 MED ORDER — CEFDINIR 300 MG PO CAPS
300.0000 mg | ORAL_CAPSULE | Freq: Two times a day (BID) | ORAL | 0 refills | Status: DC
Start: 2020-07-26 — End: 2020-09-29

## 2020-07-26 MED ORDER — AMIODARONE HCL 200 MG PO TABS: 200.0000 mg | ORAL_TABLET | Freq: Every day | ORAL | 1 refills | Status: DC

## 2020-07-26 MED ORDER — CIPROFLOXACIN HCL 500 MG PO TABS
500.0000 mg | ORAL_TABLET | Freq: Every day | ORAL | 0 refills | Status: DC
Start: 2020-07-26 — End: 2020-07-26

## 2020-07-26 MED ORDER — AMIODARONE HCL 200 MG PO TABS: 200.0000 mg | ORAL_TABLET | Freq: Every day | ORAL | Status: DC

## 2020-07-26 MED ORDER — APIXABAN 2.5 MG PO TABS: 2.5000 mg | ORAL_TABLET | Freq: Two times a day (BID) | ORAL | 1 refills | Status: DC

## 2020-07-26 MED ORDER — TAMSULOSIN HCL 0.4 MG PO CAPS: 0.4000 mg | ORAL_CAPSULE | Freq: Every day | ORAL | 1 refills | Status: DC

## 2020-07-26 MED ORDER — APIXABAN 2.5 MG PO TABS: 2.5000 mg | ORAL_TABLET | Freq: Two times a day (BID) | ORAL | Status: DC

## 2020-07-26 NOTE — Discharge Summary (Addendum)
Physician Discharge Summary  Allen Yu BPZ:[id number] DOB: October 05, 1945 DOA: 07/21/2020  PCP: [name], MD  Admit date: 07/21/2020 Discharge date: 07/26/2020  Admitted From: Home Disposition:  Home   Recommendations for Outpatient Follow-up:  Follow up with PCP in 1-2 weeks Please obtain BMP/CBC in one week Please follow up on the following pending results:  Home Health: YES Equipment/Devices: HHPT, RN  Discharge Condition: Stable CODE STATUS:FULL Diet recommendation: Heart Healthy / Carb Modified    Brief/Interim Summary:  75 y.o. 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  The patient is unable to give any significant history secondary to his altered mental status.  All of this history is obtained from review of medical record and speaking with the patient spouse.  Accordingly, the patient was recently discharged from the hospital after a stay from 06/28/2020 to 06/30/2020 when he underwent a left AKA on 06/28/2020 secondary to a poorly healing left heel ulceration.  His hospitalization was complicated by AKI as well as acute blood loss anemia.  He also had a self-limited episode of atrial fibrillation.  Anticoagulation was deferred secondary to his high fall risk and low A. fib burden.  Nevertheless, the patient's spouse states that he has had apathy and has been less communicative over the past week.  She states that he has quit taking all his medications which at baseline he normally is able to dispense to himself.  She has noted that he has been less talkative and more agitated.   She was concerned about a possible UTI and contacted the patient's PCP.  Apparently a urine sample was sent to [facility] on 07/19/2020.  Results are unknown at this time.  Patient was not started on any  antibiotics.  However, the patient was prescribed tramadol on 07/18/2020 by his PCP.  He quit taking his Percocet that was prescribed at the time of discharge from the hospital from his last admission because it was causing constipation.  There is been no other new medications. In the emergency department, the patient was afebrile and hemodynamically stable.  He was tachycardic initially in the 140s.  Oxygen saturation was 100% on room air.  BMP shows sodium 129, potassium 4.3, serum creatinine 2.37.  LFTs were unremarkable.  WBC 22.3, hemoglobin 9.8, platelets 279,000.  Lactic acid 1.6.  CT of the brain was negative for acute findings.  Chest x-ray showed hyperinflation.  The patient was admitted secondary to altered mental status and AKI with SIRS criteria.   Since admission, the patient was empirically started on vanco and cefepime after blood and urine cultures were obtained.  Blood cultures grew Klebsiella pneumoniae.  He was also noted to have 06/29/20 urine culture with Klebsiella pneumonia.  His antibiotics were narrowed to ceftriaxone.  CT of the abdomen showed diffuse urinary bladder wall emphysema with intraluminal air  and extension of air outside bladder wall with superior dissection along anterior peritoneal wall and pelvic side wall.  He improved clinically with IV abx and sepsis physiology resolved.  Discharge Diagnoses:  Sepsis -Patient presented with tachycardia and leukocytosis -Blood cultures= Klebsiella pneumoniae -urine culture = Klebsiella -Chest x-ray negative for infiltrates -Started empiric vancomycin and cefepime>>>narrowed to ceftriaxone -PCT 3.72   Emphysematous Cystitis -urine culture pending, but likely to be Klebsiella -CT abd as discussed above -case discussed with urology, Dr. Lillia Dallas foley, repeat CT  abd/pelvis if no improvement -7/5--case discussed with urology, Dr. Judyann Munson home with foley; urology to arrange outpt followup for voiding trial  -d/c home  with cefdinir x 10 more days to complete 14 days of therapy -flomax added   Klebsiella Bacteremia -source is urine -continue ceftriaxone -d/c home with cefdinir x 10 more days   Paroxysmal Atrial Fibrillation with RVR -developed evening 6/30 -continue amiodarone for now>>sinus -07/24/20--d/c amiodarone drip now that sepsis physiology improved -CHADS-VASc= 4, but not clear if he is good AC candidate long term -06/29/20 Echo--EF 60-65%, no WMA, normal PASP -stopped amio drip 07/24/20 pm-->remains in sinus -continue metoprolol -d/c home with amiodarone 200 mg daily x 4-6 weeks--cardiology will arrange outpt follow up -started apixaban 2.5 mg bid   Acute metabolic encephalopathy -Secondary to AKI and sepsis --[id number] -WUX--3.244 -Folic WNUU--7.2 -improving, now near baseline   Acute on chronic renal failure--CKD stage IIIa -Baseline creatinine 0.9-1.2 -serum creatine peaked 2.54 -Secondary to volume depletion and sepsis -Continue IV fluids -serum creatinine 1.57 on day of d/c   Hydronephrosis -due to emphysematous cystitis/Pyelonephritis -monitior clinically -foley placed>>800cc out   Hyponatremia -Secondary to volume depletion and poor solute intake -Start IV fluids>>improving   Uncontrolled diabetes mellitus type 2 with hyperglycemia -05/19/2020 hemoglobin A1c 9.2 -NovoLog sliding scale -lantus 10 units daily>>increase to 15 units -7/3 A1C--8.7   Essential hypertension -Holding losartan secondary to AKI>>will not restart -Restart metoprolol>>convert to IV as patient is refusing po meds   Hyperlipidemia -Restart statin   Dementia -Restart Aricept   Hypokalemia -replete -check mag--1.8 -add KCL to IVF  Urinary Retention -800cc out with foley placement on 07/22/20 -flomax started -outpatient voiding trial         Discharge Instructions   Allergies as of 07/26/2020       Reactions   Bee Venom Anaphylaxis   Codeine Other (See Comments)   incoherent     Diovan [valsartan] Other (See Comments)   incoherent   Propoxyphene Other (See Comments)   Dizziness, "Makes me feel drunk"        Medication List     STOP taking these medications    aspirin EC 81 MG tablet   insulin lispro 100 UNIT/ML injection Commonly known as: HUMALOG   losartan 50 MG tablet Commonly known as: COZAAR       TAKE these medications    acetaminophen 500 MG tablet Commonly known as: TYLENOL Take 500 mg by mouth every 6 (six) hours as needed for moderate pain.   amiodarone 200 MG tablet Commonly known as: PACERONE Take 1 tablet (200 mg total) by mouth daily.   apixaban 2.5 MG Tabs tablet Commonly known as: ELIQUIS Take 1 tablet (2.5 mg total) by mouth 2 (two) times daily.   atorvastatin 40 MG tablet Commonly known as: LIPITOR Take 1 tablet (40 mg total) by mouth at bedtime.   B-D UF III MINI PEN NEEDLES 31G X 5 MM Misc Generic drug: Insulin Pen Needle SMARTSIG:1 Each SUB-Q Daily   cefdinir 300 MG capsule Commonly known as: OMNICEF Take 1 capsule (300 mg total) by mouth 2 (two) times daily.   donepezil 10 MG tablet Commonly known as: ARICEPT Take 10 mg by mouth at bedtime.   feeding supplement Liqd Take 237 mLs by mouth 2 (two) times daily between meals.   gabapentin 100 MG capsule Commonly known as: NEURONTIN Take 100 mg by mouth at bedtime.   Lantus 100 UNIT/ML injection Generic drug: insulin glargine Inject 0.1 mLs (10 Units total) into the  skin at bedtime as needed (High blood glucose).   metoprolol tartrate 25 MG tablet Commonly known as: LOPRESSOR Take 1 tablet (25 mg total) by mouth 2 (two) times daily.   multivitamin with minerals tablet Take 1 tablet by mouth daily. 50 plus   nicotine 21 mg/24hr patch Commonly known as: NICODERM CQ - dosed in mg/24 hours Place 1 patch (21 mg total) onto the skin daily as needed (Cessation of smoking).   oxyCODONE-acetaminophen 5-325 MG tablet Commonly known as: Percocet Take 1  tablet by mouth every 6 (six) hours as needed for severe pain. Do not take with Acetaminophen (Tylenol) What changed: Another medication with the same name was removed. Continue taking this medication, and follow the directions you see here.   tamsulosin 0.4 MG Caps capsule Commonly known as: FLOMAX Take 1 capsule (0.4 mg total) by mouth daily.   traMADol 50 MG tablet Commonly known as: ULTRAM Take 50 mg by mouth every 6 (six) hours as needed for moderate pain.        Follow-up Information     Erma Heritage, PA-C Follow up on 08/26/2020.   Specialties: Physician Assistant, Cardiology Why: Cardiology Hospital Follow-up on 08/26/2020 at 3:30 PM. Contact information: [place] [address] [phone]         [facility] Follow up.   Specialty: Hospice Why: Palliative Services- they will call to make the first visit. Contact information: [address] [place] Follow up.   Specialty: [place] Why: RN/PT will call to schudule home visit.               Allergies  Allergen Reactions   Bee Venom Anaphylaxis   Codeine Other (See Comments)    incoherent    Diovan [Valsartan] Other (See Comments)    incoherent   Propoxyphene Other (See Comments)    Dizziness, "Makes me feel drunk"    Consultations: Cardiology urology   Procedures/Studies: CT ABDOMEN PELVIS WO CONTRAST  Result Date: 07/21/2020 CLINICAL DATA:  75 year old male with acute abdominal pain. EXAM: CT ABDOMEN AND PELVIS WITHOUT CONTRAST TECHNIQUE: Multidetector CT imaging of the abdomen and pelvis was performed following the standard protocol without IV contrast. COMPARISON:  CT abdomen pelvis dated 07/09/2016. FINDINGS: Evaluation of this exam is limited in the absence of intravenous contrast. Evaluation is also limited due to streak artifact caused by patient's arms and  loss of intra-abdominal fat and cachexia as well as anasarca. Lower chest: The visualized lung bases are clear. There is coronary vascular calcification. Hepatobiliary: The liver is grossly unremarkable. No intrahepatic biliary dilatation. Cholecystectomy. Pancreas: The pancreas is poorly visualized. Spleen: The spleen is grossly unremarkable. Adrenals/Urinary Tract: Mild bilateral adrenal thickening. There is mild bilateral hydronephrosis. Probable bilateral renal vascular calcification. The urinary bladder is distended. There is diffuse bladder wall emphysema with large amount of intraluminal air. Findings consistent with emphysematous cystitis correlation with clinical exam and urinalysis recommended. There is extension of the air outside of the bladder wall and superior dissection along the fat planes of the anterior peritoneal wall and bilateral flank and pelvic sidewall. Clinical correlation recommended to evaluate for possibility of sepsis and peritonitis. Stomach/Bowel: No evidence of bowel obstruction. Evaluation of the bowel is however is limited due to factors above. Vascular/Lymphatic: Advanced aortoiliac atherosclerotic disease. Evaluation of the vasculature is very limited on this CT. Reproductive: The prostate gland is poorly  visualized. There is air within the urethra. Other: There is loss of subcutaneous and intra-abdominal fat and cachexia. Musculoskeletal: Severe osteopenia with degenerative changes of the spine. L4-S1 posterior fusion. No acute osseous pathology. IMPRESSION: 1. Emphysematous cystitis with extension of air along the anterior peritoneal and lateral pelvic sidewalls. Clinical correlation is recommended to evaluate for possibility of sepsis and peritonitis. There is air within the urethra. 2. Mild bilateral hydronephrosis. 3. No bowel obstruction. 4. Aortic Atherosclerosis (ICD10-I70.0) and Emphysema (ICD10-J43.9). These results were called by telephone at the time of interpretation  on 07/21/2020 at 8:36 pm to the patient's nurse, Panther, who verbally acknowledged these results. Electronically Signed   By: Anner Crete M.D.   On: 07/21/2020 20:39   DG Chest 1 View  Result Date: 07/21/2020 CLINICAL DATA:  75 year old male with confusion and weakness. Recent UTI. EXAM: CHEST  1 VIEW COMPARISON:  Chest CTA 06/29/2020 and earlier. FINDINGS: Portable AP upright view at 1019 hours. Stable lung volumes and mediastinal contours. Emphysema demonstrated on the prior CTA. The mildly spiculated right upper lobe pulmonary nodule seen by CT is poorly visible with plain radiograph technique. Allowing for portable technique the lungs are clear. Visualized tracheal air column is within normal limits. Prior cervical ACDF. No acute osseous abnormality identified. Negative visible bowel gas pattern. IMPRESSION: 1. The suspicious right upper lobe pulmonary nodule seen by CT 06/29/2020 is occult by portable x-ray. 2. Emphysema.  No new cardiopulmonary abnormality. Electronically Signed   By: Genevie Ann M.D.   On: 07/21/2020 11:01   CT Head Wo Contrast  Result Date: 07/21/2020 CLINICAL DATA:  75 year old male with confusion, agitation, altered mental status. EXAM: CT HEAD WITHOUT CONTRAST TECHNIQUE: Contiguous axial images were obtained from the base of the skull through the vertex without intravenous contrast. COMPARISON:  Brain MRI 08/27/2018.  Head CT 07/08/2015. FINDINGS: Brain: Mild cerebral volume loss since 2017 appears to be generalized. No midline shift, ventriculomegaly, mass effect, evidence of mass lesion, intracranial hemorrhage or evidence of cortically based acute infarction. Patchy and confluent bilateral cerebral white matter hypodensity has progressed since 2017 but appears stable to the 2020 MRI. Bilateral basal ganglia vascular calcifications. No cortical encephalomalacia. Vascular: Calcified atherosclerosis at the skull base. No suspicious intracranial vascular hyperdensity. Skull: Mild  chronic right lamina papyracea fracture. Otherwise negative. No acute osseous abnormality identified. Sinuses/Orbits: Visualized paranasal sinuses and mastoids are stable and well aerated. Other: Visualized orbits and scalp soft tissues are within normal limits. IMPRESSION: 1. No acute intracranial abnormality. 2. Chronic small vessel disease appears stable since a 2020 MRI. Electronically Signed   By: Genevie Ann M.D.   On: 07/21/2020 11:05   CT Angio Chest Pulmonary Embolism (PE) W or WO Contrast  Result Date: 06/29/2020 CLINICAL DATA:  Rule out pulmonary embolus.  Elevated D-dimer EXAM: CT ANGIOGRAPHY CHEST WITH CONTRAST TECHNIQUE: Multidetector CT imaging of the chest was performed using the standard protocol during bolus administration of intravenous contrast. Multiplanar CT image reconstructions and MIPs were obtained to evaluate the vascular anatomy. CONTRAST:  31mL OMNIPAQUE IOHEXOL 350 MG/ML SOLN COMPARISON:  CT chest 06/30/2019 FINDINGS: Cardiovascular: Satisfactory opacification of the pulmonary arteries to the segmental level. No evidence of pulmonary embolism. Normal heart size. Aortic atherosclerosis. Coronary artery calcifications. No pericardial effusion. Mediastinum/Nodes: No enlarged mediastinal, hilar, or axillary lymph nodes. Thyroid gland, trachea, and esophagus demonstrate no significant findings. Lungs/Pleura: Moderate to advanced changes of centrilobular and paraseptal emphysema. Within the right upper lobe there is a solid-appearing nodule which measures 1.6  x 1.4 by 1.4 cm, image 32/3 Focal area of subpleural consolidation is noted within the posteromedial left base, image 74/7. Small left pleural effusion is present. Upper Abdomen: No acute abnormality. Musculoskeletal: No chest wall abnormality. No acute or significant osseous findings. Review of the MIP images confirms the above findings. IMPRESSION: 1. No evidence for acute pulmonary embolus. 2. Right upper lobe pulmonary nodule is  identified measuring up to 1.6 cm. Suspicious for primary bronchogenic carcinoma. Further evaluation with PET-CT as warranted. 3. Subpleural consolidation in the posterior left base with small left pleural effusion. Favor inflammatory or infectious etiology. A 3 month, short-term interval follow-up CT of the chest is recommended to ensure resolution. This could also be readdressed at PET-CT. 4. Aortic Atherosclerosis (ICD10-I70.0) and Emphysema (ICD10-J43.9). 5. Coronary artery calcifications. Electronically Signed   By: Kerby Moors M.D.   On: 06/29/2020 09:59   ECHOCARDIOGRAM COMPLETE  Result Date: 06/29/2020    ECHOCARDIOGRAM REPORT   Patient Name:   TAVIO BIEGEL Date of Exam: 06/29/2020 Medical Rec #:  [id number]      Height:       73.0 in Accession #:    [id number]     Weight:       127.2 lb Date of Birth:  [birth year]     BSA:          1.775 m Patient Age:    2 years       BP:           159/83 mmHg Patient Gender: M              HR:           95 bpm. Exam Location:  Inpatient Procedure: 2D Echo, Cardiac Doppler and Color Doppler Indications:    I48.91* Unspeicified atrial fibrillation  History:        Patient has prior history of Echocardiogram examinations, most                 recent 07/10/2015. PAD. One day post BKA.  Sonographer:    Merrie Roof RDCS Referring Phys: [place]  1. Left ventricular ejection fraction, by estimation, is 60 to 65%. The left ventricle has normal function. The left ventricle has no regional wall motion abnormalities. Left ventricular diastolic parameters are indeterminate.  2. Right ventricular systolic function is normal. The right ventricular size is normal. There is normal pulmonary artery systolic pressure. The estimated right ventricular systolic pressure is 02.4 mmHg.  3. Left atrial size was mildly dilated.  4. Right atrial size was mildly dilated.  5. The mitral valve is normal in structure. Mild mitral valve regurgitation. No evidence of  mitral stenosis.  6. The aortic valve is normal in structure. Aortic valve regurgitation is not visualized. Mild to moderate aortic valve sclerosis/calcification is present, without any evidence of aortic stenosis.  7. The inferior vena cava is normal in size with greater than 50% respiratory variability, suggesting right atrial pressure of 3 mmHg. FINDINGS  Left Ventricle: Left ventricular ejection fraction, by estimation, is 60 to 65%. The left ventricle has normal function. The left ventricle has no regional wall motion abnormalities. The left ventricular internal cavity size was normal in size. There is  no left ventricular hypertrophy. Left ventricular diastolic parameters are indeterminate. Right Ventricle: The right ventricular size is normal. No increase in right ventricular wall thickness. Right ventricular systolic function is normal. There is normal pulmonary artery systolic pressure. The tricuspid regurgitant velocity is  2.66 m/s, and  with an assumed right atrial pressure of 3 mmHg, the estimated right ventricular systolic pressure is 26.7 mmHg. Left Atrium: Left atrial size was mildly dilated. Right Atrium: Right atrial size was mildly dilated. Pericardium: There is no evidence of pericardial effusion. Mitral Valve: The mitral valve is normal in structure. Mild mitral valve regurgitation. No evidence of mitral valve stenosis. Tricuspid Valve: The tricuspid valve is normal in structure. Tricuspid valve regurgitation is mild . No evidence of tricuspid stenosis. Aortic Valve: The aortic valve is normal in structure. Aortic valve regurgitation is not visualized. Mild to moderate aortic valve sclerosis/calcification is present, without any evidence of aortic stenosis. Aortic valve mean gradient measures 4.0 mmHg. Aortic valve peak gradient measures 7.8 mmHg. Aortic valve area, by VTI measures 2.01 cm. Pulmonic Valve: The pulmonic valve was normal in structure. Pulmonic valve regurgitation is not  visualized. No evidence of pulmonic stenosis. Aorta: The aortic root is normal in size and structure. Venous: The inferior vena cava is normal in size with greater than 50% respiratory variability, suggesting right atrial pressure of 3 mmHg. IAS/Shunts: No atrial level shunt detected by color flow Doppler.  LEFT VENTRICLE PLAX 2D LVIDd:         5.10 cm LVIDs:         3.80 cm LV PW:         1.00 cm LV IVS:        1.00 cm LVOT diam:     2.00 cm LV SV:         54 LV SV Index:   30 LVOT Area:     3.14 cm  RIGHT VENTRICLE          IVC RV Basal diam:  3.90 cm  IVC diam: 1.60 cm LEFT ATRIUM             Index       RIGHT ATRIUM           Index LA diam:        3.80 cm 2.14 cm/m  RA Area:     20.00 cm LA Vol (A2C):   69.7 ml 39.26 ml/m RA Volume:   59.60 ml  33.57 ml/m LA Vol (A4C):   69.6 ml 39.21 ml/m LA Biplane Vol: 72.4 ml 40.78 ml/m  AORTIC VALVE AV Area (Vmax):    2.05 cm AV Area (Vmean):   2.12 cm AV Area (VTI):     2.01 cm AV Vmax:           140.00 cm/s AV Vmean:          92.800 cm/s AV VTI:            0.267 m AV Peak Grad:      7.8 mmHg AV Mean Grad:      4.0 mmHg LVOT Vmax:         91.40 cm/s LVOT Vmean:        62.500 cm/s LVOT VTI:          0.171 m LVOT/AV VTI ratio: 0.64  AORTA Ao Root diam: 3.60 cm TRICUSPID VALVE TR Peak grad:   28.3 mmHg TR Vmax:        266.00 cm/s  SHUNTS Systemic VTI:  0.17 m Systemic Diam: 2.00 cm Candee Furbish MD Electronically signed by Candee Furbish MD Signature Date/Time: 06/29/2020/5:23:45 PM    Final         Discharge Exam: Vitals:   07/25/20 1343 07/25/20 2101  BP: [name])  141/88 130/69  Pulse: 68 72  Resp: 16 18  Temp: 98.2 F (36.8 C) (!) 97.5 F (36.4 C)  SpO2: 100% 100%   Vitals:   07/25/20 0212 07/25/20 0611 07/25/20 1343 07/25/20 2101  BP: 133/65 130/83 (!) 141/88 130/69  Pulse: 65 70 68 72  Resp: 18 18 16 18   Temp: 97.8 F (36.6 C) (!) 97.4 F (36.3 C) 98.2 F (36.8 C) (!) 97.5 F (36.4 C)  TempSrc: Oral Oral  Oral  SpO2: 99% 100% 100% 100%  Weight:       Height:        General: Pt is alert, awake, not in acute distress Cardiovascular: RRR, S1/S2 +, no rubs, no gallops Respiratory: diminished BS.  No wheeze Abdominal: Soft, NT, ND, bowel sounds + Extremities: no edema, no cyanosis   The results of significant diagnostics from this hospitalization (including imaging, microbiology, ancillary and laboratory) are listed below for reference.    Significant Diagnostic Studies: CT ABDOMEN PELVIS WO CONTRAST  Result Date: 07/21/2020 CLINICAL DATA:  75 year old male with acute abdominal pain. EXAM: CT ABDOMEN AND PELVIS WITHOUT CONTRAST TECHNIQUE: Multidetector CT imaging of the abdomen and pelvis was performed following the standard protocol without IV contrast. COMPARISON:  CT abdomen pelvis dated 07/09/2016. FINDINGS: Evaluation of this exam is limited in the absence of intravenous contrast. Evaluation is also limited due to streak artifact caused by patient's arms and loss of intra-abdominal fat and cachexia as well as anasarca. Lower chest: The visualized lung bases are clear. There is coronary vascular calcification. Hepatobiliary: The liver is grossly unremarkable. No intrahepatic biliary dilatation. Cholecystectomy. Pancreas: The pancreas is poorly visualized. Spleen: The spleen is grossly unremarkable. Adrenals/Urinary Tract: Mild bilateral adrenal thickening. There is mild bilateral hydronephrosis. Probable bilateral renal vascular calcification. The urinary bladder is distended. There is diffuse bladder wall emphysema with large amount of intraluminal air. Findings consistent with emphysematous cystitis correlation with clinical exam and urinalysis recommended. There is extension of the air outside of the bladder wall and superior dissection along the fat planes of the anterior peritoneal wall and bilateral flank and pelvic sidewall. Clinical correlation recommended to evaluate for possibility of sepsis and peritonitis. Stomach/Bowel: No  evidence of bowel obstruction. Evaluation of the bowel is however is limited due to factors above. Vascular/Lymphatic: Advanced aortoiliac atherosclerotic disease. Evaluation of the vasculature is very limited on this CT. Reproductive: The prostate gland is poorly visualized. There is air within the urethra. Other: There is loss of subcutaneous and intra-abdominal fat and cachexia. Musculoskeletal: Severe osteopenia with degenerative changes of the spine. L4-S1 posterior fusion. No acute osseous pathology. IMPRESSION: 1. Emphysematous cystitis with extension of air along the anterior peritoneal and lateral pelvic sidewalls. Clinical correlation is recommended to evaluate for possibility of sepsis and peritonitis. There is air within the urethra. 2. Mild bilateral hydronephrosis. 3. No bowel obstruction. 4. Aortic Atherosclerosis (ICD10-I70.0) and Emphysema (ICD10-J43.9). These results were called by telephone at the time of interpretation on 07/21/2020 at 8:36 pm to the patient's nurse, Panther, who verbally acknowledged these results. Electronically Signed   By: Anner Crete M.D.   On: 07/21/2020 20:39   DG Chest 1 View  Result Date: 07/21/2020 CLINICAL DATA:  75 year old male with confusion and weakness. Recent UTI. EXAM: CHEST  1 VIEW COMPARISON:  Chest CTA 06/29/2020 and earlier. FINDINGS: Portable AP upright view at 1019 hours. Stable lung volumes and mediastinal contours. Emphysema demonstrated on the prior CTA. The mildly spiculated right upper lobe pulmonary nodule seen by  CT is poorly visible with plain radiograph technique. Allowing for portable technique the lungs are clear. Visualized tracheal air column is within normal limits. Prior cervical ACDF. No acute osseous abnormality identified. Negative visible bowel gas pattern. IMPRESSION: 1. The suspicious right upper lobe pulmonary nodule seen by CT 06/29/2020 is occult by portable x-ray. 2. Emphysema.  No new cardiopulmonary abnormality.  Electronically Signed   By: Genevie Ann M.D.   On: 07/21/2020 11:01   CT Head Wo Contrast  Result Date: 07/21/2020 CLINICAL DATA:  75 year old male with confusion, agitation, altered mental status. EXAM: CT HEAD WITHOUT CONTRAST TECHNIQUE: Contiguous axial images were obtained from the base of the skull through the vertex without intravenous contrast. COMPARISON:  Brain MRI 08/27/2018.  Head CT 07/08/2015. FINDINGS: Brain: Mild cerebral volume loss since 2017 appears to be generalized. No midline shift, ventriculomegaly, mass effect, evidence of mass lesion, intracranial hemorrhage or evidence of cortically based acute infarction. Patchy and confluent bilateral cerebral white matter hypodensity has progressed since 2017 but appears stable to the 2020 MRI. Bilateral basal ganglia vascular calcifications. No cortical encephalomalacia. Vascular: Calcified atherosclerosis at the skull base. No suspicious intracranial vascular hyperdensity. Skull: Mild chronic right lamina papyracea fracture. Otherwise negative. No acute osseous abnormality identified. Sinuses/Orbits: Visualized paranasal sinuses and mastoids are stable and well aerated. Other: Visualized orbits and scalp soft tissues are within normal limits. IMPRESSION: 1. No acute intracranial abnormality. 2. Chronic small vessel disease appears stable since a 2020 MRI. Electronically Signed   By: Genevie Ann M.D.   On: 07/21/2020 11:05   CT Angio Chest Pulmonary Embolism (PE) W or WO Contrast  Result Date: 06/29/2020 CLINICAL DATA:  Rule out pulmonary embolus.  Elevated D-dimer EXAM: CT ANGIOGRAPHY CHEST WITH CONTRAST TECHNIQUE: Multidetector CT imaging of the chest was performed using the standard protocol during bolus administration of intravenous contrast. Multiplanar CT image reconstructions and MIPs were obtained to evaluate the vascular anatomy. CONTRAST:  66mL OMNIPAQUE IOHEXOL 350 MG/ML SOLN COMPARISON:  CT chest 06/30/2019 FINDINGS: Cardiovascular:  Satisfactory opacification of the pulmonary arteries to the segmental level. No evidence of pulmonary embolism. Normal heart size. Aortic atherosclerosis. Coronary artery calcifications. No pericardial effusion. Mediastinum/Nodes: No enlarged mediastinal, hilar, or axillary lymph nodes. Thyroid gland, trachea, and esophagus demonstrate no significant findings. Lungs/Pleura: Moderate to advanced changes of centrilobular and paraseptal emphysema. Within the right upper lobe there is a solid-appearing nodule which measures 1.6 x 1.4 by 1.4 cm, image 32/3 Focal area of subpleural consolidation is noted within the posteromedial left base, image 74/7. Small left pleural effusion is present. Upper Abdomen: No acute abnormality. Musculoskeletal: No chest wall abnormality. No acute or significant osseous findings. Review of the MIP images confirms the above findings. IMPRESSION: 1. No evidence for acute pulmonary embolus. 2. Right upper lobe pulmonary nodule is identified measuring up to 1.6 cm. Suspicious for primary bronchogenic carcinoma. Further evaluation with PET-CT as warranted. 3. Subpleural consolidation in the posterior left base with small left pleural effusion. Favor inflammatory or infectious etiology. A 3 month, short-term interval follow-up CT of the chest is recommended to ensure resolution. This could also be readdressed at PET-CT. 4. Aortic Atherosclerosis (ICD10-I70.0) and Emphysema (ICD10-J43.9). 5. Coronary artery calcifications. Electronically Signed   By: Kerby Moors M.D.   On: 06/29/2020 09:59   ECHOCARDIOGRAM COMPLETE  Result Date: 06/29/2020    ECHOCARDIOGRAM REPORT   Patient Name:   Allen Yu Date of Exam: 06/29/2020 Medical Rec #:  [id number]      Height:  73.0 in Accession #:    [id number]     Weight:       127.2 lb Date of Birth:  [birth year]     BSA:          1.775 m Patient Age:    89 years       BP:           159/83 mmHg Patient Gender: M              HR:           95 bpm. Exam  Location:  Inpatient Procedure: 2D Echo, Cardiac Doppler and Color Doppler Indications:    I48.91* Unspeicified atrial fibrillation  History:        Patient has prior history of Echocardiogram examinations, most                 recent 07/10/2015. PAD. One day post BKA.  Sonographer:    Merrie Roof RDCS Referring Phys: [place]  1. Left ventricular ejection fraction, by estimation, is 60 to 65%. The left ventricle has normal function. The left ventricle has no regional wall motion abnormalities. Left ventricular diastolic parameters are indeterminate.  2. Right ventricular systolic function is normal. The right ventricular size is normal. There is normal pulmonary artery systolic pressure. The estimated right ventricular systolic pressure is 52.8 mmHg.  3. Left atrial size was mildly dilated.  4. Right atrial size was mildly dilated.  5. The mitral valve is normal in structure. Mild mitral valve regurgitation. No evidence of mitral stenosis.  6. The aortic valve is normal in structure. Aortic valve regurgitation is not visualized. Mild to moderate aortic valve sclerosis/calcification is present, without any evidence of aortic stenosis.  7. The inferior vena cava is normal in size with greater than 50% respiratory variability, suggesting right atrial pressure of 3 mmHg. FINDINGS  Left Ventricle: Left ventricular ejection fraction, by estimation, is 60 to 65%. The left ventricle has normal function. The left ventricle has no regional wall motion abnormalities. The left ventricular internal cavity size was normal in size. There is  no left ventricular hypertrophy. Left ventricular diastolic parameters are indeterminate. Right Ventricle: The right ventricular size is normal. No increase in right ventricular wall thickness. Right ventricular systolic function is normal. There is normal pulmonary artery systolic pressure. The tricuspid regurgitant velocity is 2.66 m/s, and  with an assumed right  atrial pressure of 3 mmHg, the estimated right ventricular systolic pressure is 41.3 mmHg. Left Atrium: Left atrial size was mildly dilated. Right Atrium: Right atrial size was mildly dilated. Pericardium: There is no evidence of pericardial effusion. Mitral Valve: The mitral valve is normal in structure. Mild mitral valve regurgitation. No evidence of mitral valve stenosis. Tricuspid Valve: The tricuspid valve is normal in structure. Tricuspid valve regurgitation is mild . No evidence of tricuspid stenosis. Aortic Valve: The aortic valve is normal in structure. Aortic valve regurgitation is not visualized. Mild to moderate aortic valve sclerosis/calcification is present, without any evidence of aortic stenosis. Aortic valve mean gradient measures 4.0 mmHg. Aortic valve peak gradient measures 7.8 mmHg. Aortic valve area, by VTI measures 2.01 cm. Pulmonic Valve: The pulmonic valve was normal in structure. Pulmonic valve regurgitation is not visualized. No evidence of pulmonic stenosis. Aorta: The aortic root is normal in size and structure. Venous: The inferior vena cava is normal in size with greater than 50% respiratory variability, suggesting right atrial pressure of 3 mmHg. IAS/Shunts: No atrial level  shunt detected by color flow Doppler.  LEFT VENTRICLE PLAX 2D LVIDd:         5.10 cm LVIDs:         3.80 cm LV PW:         1.00 cm LV IVS:        1.00 cm LVOT diam:     2.00 cm LV SV:         54 LV SV Index:   30 LVOT Area:     3.14 cm  RIGHT VENTRICLE          IVC RV Basal diam:  3.90 cm  IVC diam: 1.60 cm LEFT ATRIUM             Index       RIGHT ATRIUM           Index LA diam:        3.80 cm 2.14 cm/m  RA Area:     20.00 cm LA Vol (A2C):   69.7 ml 39.26 ml/m RA Volume:   59.60 ml  33.57 ml/m LA Vol (A4C):   69.6 ml 39.21 ml/m LA Biplane Vol: 72.4 ml 40.78 ml/m  AORTIC VALVE AV Area (Vmax):    2.05 cm AV Area (Vmean):   2.12 cm AV Area (VTI):     2.01 cm AV Vmax:           140.00 cm/s AV Vmean:           92.800 cm/s AV VTI:            0.267 m AV Peak Grad:      7.8 mmHg AV Mean Grad:      4.0 mmHg LVOT Vmax:         91.40 cm/s LVOT Vmean:        62.500 cm/s LVOT VTI:          0.171 m LVOT/AV VTI ratio: 0.64  AORTA Ao Root diam: 3.60 cm TRICUSPID VALVE TR Peak grad:   28.3 mmHg TR Vmax:        266.00 cm/s  SHUNTS Systemic VTI:  0.17 m Systemic Diam: 2.00 cm Candee Furbish MD Electronically signed by Candee Furbish MD Signature Date/Time: 06/29/2020/5:23:45 PM    Final     Microbiology: Recent Results (from the past 240 hour(s))  Culture, blood (routine x 2)     Status: Abnormal   Collection Time: 07/21/20 12:43 PM   Specimen: BLOOD  Result Value Ref Range Status   Specimen Description   Final    BLOOD LEFT ARM Performed at [facility], [address], [place], [place] [address]    Special Requests   Final    BOTTLES DRAWN AEROBIC AND ANAEROBIC Blood Culture adequate volume Performed at [facility], [address] [place], [place] [address]    Culture  Setup Time   Final    IN BOTH AEROBIC AND ANAEROBIC BOTTLES GRAM NEGATIVE RODS Gram Stain Report Called to,Read Back By and Verified With: PEACH,RN@0222  07/22/20 MKELLY CRITICAL RESULT CALLED TO, READ BACK BY AND VERIFIED WITH: S,HALL @1038  07/22/20 DV Performed at [facility], [address] [place], [place] [address]    Culture KLEBSIELLA PNEUMONIAE (A)  Final   Report Status 07/24/2020 FINAL  Final   Organism ID, Bacteria KLEBSIELLA PNEUMONIAE  Final      Susceptibility   Klebsiella pneumoniae - MIC*    AMPICILLIN >=32 RESISTANT Resistant     CEFAZOLIN <=4 SENSITIVE Sensitive  CEFEPIME <=0.12 SENSITIVE Sensitive     CEFTAZIDIME <=1 SENSITIVE Sensitive     CEFTRIAXONE <=0.25 SENSITIVE Sensitive     CIPROFLOXACIN <=0.25 SENSITIVE Sensitive     GENTAMICIN <=1 SENSITIVE Sensitive     IMIPENEM <=0.25 SENSITIVE Sensitive     TRIMETH/SULFA <=20 SENSITIVE Sensitive     AMPICILLIN/SULBACTAM >=32 RESISTANT Resistant     PIP/TAZO 16  SENSITIVE Sensitive     * KLEBSIELLA PNEUMONIAE  Culture, blood (routine x 2)     Status: Abnormal   Collection Time: 07/21/20 12:43 PM   Specimen: BLOOD  Result Value Ref Range Status   Specimen Description   Final    BLOOD LEFT HAND Performed at [facility], [address], [place], [place] [address]    Special Requests   Final    BOTTLES DRAWN AEROBIC ONLY Blood Culture results may not be optimal due to an inadequate volume of blood received in culture bottles Performed at [facility], [address], [place], [place] [address]    Culture  Setup Time   Final    AEROBIC BOTTLE ONLY GRAM NEGATIVE RODS Gram Stain Report Called to,Read Back By and Verified With: PEACH,RN@0223  07/22/20 [facility] Performed at [facility], [address], [place], [place] [address]    Culture (A)  Final    KLEBSIELLA PNEUMONIAE SUSCEPTIBILITIES PERFORMED ON PREVIOUS CULTURE WITHIN THE LAST 5 DAYS. Performed at [facility], [place] [address], [place], [place] [address]    Report Status 07/24/2020 FINAL  Final  Blood Culture ID Panel (Reflexed)     Status: Abnormal   Collection Time: 07/21/20 12:43 PM  Result Value Ref Range Status   Enterococcus faecalis NOT DETECTED NOT DETECTED Final   Enterococcus Faecium NOT DETECTED NOT DETECTED Final   Listeria monocytogenes NOT DETECTED NOT DETECTED Final   Staphylococcus species NOT DETECTED NOT DETECTED Final   Staphylococcus aureus (BCID) NOT DETECTED NOT DETECTED Final   Staphylococcus epidermidis NOT DETECTED NOT DETECTED Final   Staphylococcus lugdunensis NOT DETECTED NOT DETECTED Final   Streptococcus species NOT DETECTED NOT DETECTED Final   Streptococcus agalactiae NOT DETECTED NOT DETECTED Final   Streptococcus pneumoniae NOT DETECTED NOT DETECTED Final   Streptococcus pyogenes NOT DETECTED NOT DETECTED Final   A.calcoaceticus-baumannii NOT DETECTED NOT DETECTED Final   Bacteroides fragilis NOT DETECTED NOT DETECTED Final   Enterobacterales  DETECTED (A) NOT DETECTED Final    Comment: Enterobacterales represent a large order of gram negative bacteria, not a single organism. CRITICAL RESULT CALLED TO, READ BACK BY AND VERIFIED WITH: S. HALL PHARMD, AT 1038 07/22/20 D. VANHOOK    Enterobacter cloacae complex NOT DETECTED NOT DETECTED Final   Escherichia coli NOT DETECTED NOT DETECTED Final   Klebsiella aerogenes NOT DETECTED NOT DETECTED Final   Klebsiella oxytoca NOT DETECTED NOT DETECTED Final   Klebsiella pneumoniae DETECTED (A) NOT DETECTED Final    Comment: CRITICAL RESULT CALLED TO, READ BACK BY AND VERIFIED WITH: S. HALL PHARMD, AT 1038 07/22/20 D. VANHOOK    Proteus species NOT DETECTED NOT DETECTED Final   Salmonella species NOT DETECTED NOT DETECTED Final   Serratia marcescens NOT DETECTED NOT DETECTED Final   Haemophilus influenzae NOT DETECTED NOT DETECTED Final   Neisseria meningitidis NOT DETECTED NOT DETECTED Final   Pseudomonas aeruginosa NOT DETECTED NOT DETECTED Final   Stenotrophomonas maltophilia NOT DETECTED NOT DETECTED Final   Candida albicans NOT DETECTED NOT DETECTED Final   Candida auris NOT DETECTED NOT DETECTED Final   Candida  glabrata NOT DETECTED NOT DETECTED Final   Candida krusei NOT DETECTED NOT DETECTED Final   Candida parapsilosis NOT DETECTED NOT DETECTED Final   Candida tropicalis NOT DETECTED NOT DETECTED Final   Cryptococcus neoformans/gattii NOT DETECTED NOT DETECTED Final   CTX-M ESBL NOT DETECTED NOT DETECTED Final   Carbapenem resistance IMP NOT DETECTED NOT DETECTED Final   Carbapenem resistance KPC NOT DETECTED NOT DETECTED Final   Carbapenem resistance NDM NOT DETECTED NOT DETECTED Final   Carbapenem resist OXA 48 LIKE NOT DETECTED NOT DETECTED Final   Carbapenem resistance VIM NOT DETECTED NOT DETECTED Final    Comment: Performed at [facility], [place] [address], [place], [place] [address]  MRSA Next Gen by PCR, Nasal     Status: None   Collection Time: 07/21/20 11:56 PM    Specimen: Nasal Mucosa; Nasal Swab  Result Value Ref Range Status   MRSA by PCR Next Gen NOT DETECTED NOT DETECTED Final    Comment: (NOTE) The GeneXpert MRSA Assay (FDA approved for NASAL specimens only), is one component of a comprehensive MRSA colonization surveillance program. It is not intended to diagnose MRSA infection nor to guide or monitor treatment for MRSA infections. Test performance is not FDA approved in patients less than 72 years old. Performed at [facility], [address] [place], [place] [address]   Urine culture     Status: Abnormal   Collection Time: 07/22/20  1:50 PM   Specimen: Urine, Clean Catch  Result Value Ref Range Status   Specimen Description   Final    URINE, CLEAN CATCH Performed at [facility], [address], [place], [place] [address]    Special Requests   Final    NONE Performed at [facility], [address], [place], [place] [address]    Culture 80,000 COLONIES/mL KLEBSIELLA PNEUMONIAE (A)  Final   Report Status 07/25/2020 FINAL  Final   Organism ID, Bacteria KLEBSIELLA PNEUMONIAE (A)  Final      Susceptibility   Klebsiella pneumoniae - MIC*    AMPICILLIN >=32 RESISTANT Resistant     CEFAZOLIN <=4 SENSITIVE Sensitive     CEFEPIME <=0.12 SENSITIVE Sensitive     CEFTRIAXONE <=0.25 SENSITIVE Sensitive     CIPROFLOXACIN <=0.25 SENSITIVE Sensitive     GENTAMICIN <=1 SENSITIVE Sensitive     IMIPENEM <=0.25 SENSITIVE Sensitive     NITROFURANTOIN 128 RESISTANT Resistant     TRIMETH/SULFA <=20 SENSITIVE Sensitive     AMPICILLIN/SULBACTAM >=32 RESISTANT Resistant     PIP/TAZO 32 INTERMEDIATE Intermediate     * 80,000 COLONIES/mL KLEBSIELLA PNEUMONIAE     Labs: Basic Metabolic Panel: Recent Labs  Lab 07/21/20 1055 07/22/20 0438 07/24/20 0328 07/25/20 0635 07/26/20 0632  NA 129* 132* 130* 132* 136  K 4.3 4.6 3.0* 3.6 3.6  CL 94* 96* 99 103 108  CO2 25 24 21* 21* 22  GLUCOSE 350* 344* 482* 229* 92  BUN 82* 88* 70* 57* 50*   CREATININE 2.37* 2.54* 1.85* 1.70* 1.57*  CALCIUM 9.4 8.8* 7.7* 8.0* 8.2*  MG  --   --   --  1.8 1.8   Liver Function Tests: Recent Labs  Lab 07/21/20 1055 07/22/20 0438 07/25/20 0635  AST 17 18 65*  ALT 16 15 41  ALKPHOS 117 103 193*  BILITOT 1.4* 1.3* 0.6  PROT 6.9 6.0* 5.1*  ALBUMIN 3.4* 2.7* 1.9*   Recent Labs  Lab 07/21/20 1723  LIPASE 19   No results for input(s):  AMMONIA in the last 168 hours. CBC: Recent Labs  Lab 07/21/20 1055 07/22/20 0438 07/24/20 0328 07/25/20 0635 07/26/20 0632  WBC 22.3* 22.7* 22.4* 16.8* 15.3*  NEUTROABS 19.8*  --   --   --   --   HGB 9.8* 9.0* 7.4* 8.4* 7.5*  HCT 29.8* 27.3* 22.1* 24.6* 22.6*  MCV 95.2 94.8 92.5 91.4 93.0  PLT 279 178 109* 81* 81*   Cardiac Enzymes: No results for input(s): CKTOTAL, CKMB, CKMBINDEX, TROPONINI in the last 168 hours. BNP: Invalid input(s): POCBNP CBG: Recent Labs  Lab 07/25/20 1117 07/25/20 1606 07/25/20 2102 07/26/20 0723 07/26/20 1112  GLUCAP 293* 270* 151* 102* 160*    Time coordinating discharge:  36 minutes  Signed:  Orson Eva, DO [facility] Pager: [phone] 07/26/2020, 1:36 PM

## 2020-07-26 NOTE — Progress Notes (Signed)
Palliative: Allen Yu is lying quietly in bed.  He greets me making and somewhat keeping eye contact.  He appears acutely/chronically ill, frail and thin.  I believe that he is able to make his basic needs known.  There is no family at bedside at this time.  I offer Allen Yu some water, and he is able to drink without overt signs and symptoms of aspiration.  Call to wife, Allen Yu.  We talked about discharge today, home with home health.  We talked about outpatient palliative services to talk about the "what if's and maybe's".  Allen Yu readily agrees to outpatient palliative services with [facility].  Conference with attending, bedside nursing staff, transition of care team related to patient condition, needs, goals of care, disposition.  Plan: Continue full scope/full code.  Home with home health.  Outpatient palliative services to follow.  25 minutes Allen Axe, NP Palliative medicine team Team phone [phone] Greater than 50% of this time was spent counseling and coordinating care related to the above assessment and plan.

## 2020-07-26 NOTE — Plan of Care (Signed)
  Problem: Education: Goal: Knowledge of General Education information will improve Description: Including pain rating scale, medication(s)/side effects and non-pharmacologic comfort measures Outcome: Adequate for Discharge   Problem: Health Behavior/Discharge Planning: Goal: Ability to manage health-related needs will improve Outcome: Adequate for Discharge   Problem: Clinical Measurements: Goal: Ability to maintain clinical measurements within normal limits will improve Outcome: Adequate for Discharge Goal: Will remain free from infection Outcome: Adequate for Discharge Goal: Diagnostic test results will improve Outcome: Adequate for Discharge Goal: Respiratory complications will improve Outcome: Adequate for Discharge Goal: Cardiovascular complication will be avoided Outcome: Adequate for Discharge   Problem: Activity: Goal: Risk for activity intolerance will decrease Outcome: Adequate for Discharge   Problem: Nutrition: Goal: Adequate nutrition will be maintained Outcome: Adequate for Discharge   Problem: Coping: Goal: Level of anxiety will decrease Outcome: Adequate for Discharge   Problem: Elimination: Goal: Will not experience complications related to bowel motility Outcome: Adequate for Discharge Goal: Will not experience complications related to urinary retention Outcome: Adequate for Discharge   Problem: Pain Managment: Goal: General experience of comfort will improve Outcome: Adequate for Discharge   Problem: Safety: Goal: Ability to remain free from injury will improve Outcome: Adequate for Discharge   Problem: Skin Integrity: Goal: Risk for impaired skin integrity will decrease Outcome: Adequate for Discharge   Problem: Urinary Elimination: Goal: Signs and symptoms of infection will decrease Outcome: Adequate for Discharge   

## 2020-07-26 NOTE — TOC Transition Note (Signed)
Transition of Care [facility]) - CM/SW Discharge Note   Patient Details  Name: Allen Yu MRN: [id number] Date of Birth: 1945-11-17  Transition of [facility]) CM/SW Contact:  Boneta Lucks, RN Phone Number: 07/26/2020, 1:05 PM   Clinical Narrative:   Patient discharging home. Wife his here to transport patient home. Patient is active with [facility], order have been placed and [facility] updated.  Palliative consult complete and recommending outpatient palliative. Referral sent to [facility]. TOC explained to his wife.  Final next level of care: Home w Home Health Services Barriers to Discharge: Barriers Resolved  Patient Goals and CMS Choice   CMS Medicare.gov Compare Post Acute Care list provided to:: Patient Represenative (must comment)    Discharge Placement             Patient chooses bed at:  (home) Patient to be transferred to facility by: wife Name of family member notified: [name] Patient and family notified of of transfer: 07/26/20  Discharge Plan and Services      Readmission Risk Interventions Readmission Risk Prevention Plan 07/26/2020 06/30/2020  Post Dischage Appt - Complete  Medication Screening - Complete  Transportation Screening Complete Complete  PCP or Specialist Appt within 3-5 Days Complete -  HRI or Home Care Consult Complete -  Social Work Consult for Recovery Care Planning/Counseling Complete -  Palliative Care Screening Complete -  Medication Review Press photographer) Complete -  Some recent data might be hidden

## 2020-07-26 NOTE — Progress Notes (Signed)
Patient refusing vitals signs and CHG bath at this time.  When explaining to patient that he is in the hospital and that these are things we are supposed to do to make sure he's still getting better. He loud verbally and began cussing at the staff.

## 2020-07-26 NOTE — Progress Notes (Addendum)
Progress Note  Patient Name: Allen Yu Date of Encounter: 07/26/2020  [facility] Cardiologist: Rozann Lesches, MD   Subjective   Sleeping this morning. Awakens to voice but quickly turns away to go back to sleep.   Inpatient Medications    Scheduled Meds:  atorvastatin  40 mg Oral QHS   Chlorhexidine Gluconate Cloth  6 each Topical Daily   donepezil  10 mg Oral QHS   enoxaparin (LOVENOX) injection  40 mg Subcutaneous Q24H   feeding supplement (GLUCERNA SHAKE)  237 mL Oral TID BM   insulin aspart  0-15 Units Subcutaneous TID WC   insulin aspart  0-5 Units Subcutaneous QHS   insulin glargine  15 Units Subcutaneous Daily   metoprolol tartrate  25 mg Oral BID   multivitamin with minerals  1 tablet Oral Daily   Continuous Infusions:  0.9 % NaCl with KCl 20 mEq / L Stopped (07/26/20 0319)   cefTRIAXone (ROCEPHIN)  IV 100 mL/hr at 07/25/20 1224   PRN Meds: acetaminophen **OR** acetaminophen, ondansetron **OR** ondansetron (ZOFRAN) IV, traMADol   Vital Signs    Vitals:   07/25/20 0212 07/25/20 0611 07/25/20 1343 07/25/20 2101  BP: 133/65 130/83 (!) 141/88 130/69  Pulse: 65 70 68 72  Resp: 18 18 16 18   Temp: 97.8 F (36.6 C) (!) 97.4 F (36.3 C) 98.2 F (36.8 C) (!) 97.5 F (36.4 C)  TempSrc: Oral Oral  Oral  SpO2: 99% 100% 100% 100%  Weight:      Height:        Intake/Output Summary (Last 24 hours) at 07/26/2020 0941 Last data filed at 07/26/2020 0900 Gross per 24 hour  Intake 2550.13 ml  Output 3200 ml  Net -649.87 ml   Last 3 Weights 07/23/2020 07/21/2020 06/28/2020  Weight (lbs) 98 lb 1.7 oz 127 lb 127 lb 3.3 oz  Weight (kg) 44.5 kg 57.607 kg 57.7 kg      Telemetry    NSR, HR in 60's to 70's with occasional PAC's and PVC's. Refusing telemetry this AM.  - Personally Reviewed  ECG    No new tracings.   Physical Exam   Patient declined examination on my attempt. Turns away and goes back to sleep.   Labs    High Sensitivity Troponin:   Recent  Labs  Lab 06/29/20 0509 06/29/20 0654  TROPONINIHS 23* 22*      Chemistry Recent Labs  Lab 07/21/20 1055 07/22/20 0438 07/24/20 0328 07/25/20 0635 07/26/20 0632  NA 129* 132* 130* 132* 136  K 4.3 4.6 3.0* 3.6 3.6  CL 94* 96* 99 103 108  CO2 25 24 21* 21* 22  GLUCOSE 350* 344* 482* 229* 92  BUN 82* 88* 70* 57* 50*  CREATININE 2.37* 2.54* 1.85* 1.70* 1.57*  CALCIUM 9.4 8.8* 7.7* 8.0* 8.2*  PROT 6.9 6.0*  --  5.1*  --   ALBUMIN 3.4* 2.7*  --  1.9*  --   AST 17 18  --  65*  --   ALT 16 15  --  41  --   ALKPHOS 117 103  --  193*  --   BILITOT 1.4* 1.3*  --  0.6  --   GFRNONAA 28* 26* 38* 42* 46*  ANIONGAP 10 12 10 8 6      Hematology Recent Labs  Lab 07/24/20 0328 07/25/20 0635 07/26/20 0632  WBC 22.4* 16.8* 15.3*  RBC 2.39* 2.69* 2.43*  HGB 7.4* 8.4* 7.5*  HCT 22.1* 24.6* 22.6*  MCV  92.5 91.4 93.0  MCH 31.0 31.2 30.9  MCHC 33.5 34.1 33.2  RDW 15.5 15.7* 16.4*  PLT 109* 81* 81*    BNPNo results for input(s): BNP, PROBNP in the last 168 hours.   DDimer No results for input(s): DDIMER in the last 168 hours.   Radiology    No results found.  Cardiac Studies   Echocardiogram: 06/2020 IMPRESSIONS     1. Left ventricular ejection fraction, by estimation, is 60 to 65%. The  left ventricle has normal function. The left ventricle has no regional  wall motion abnormalities. Left ventricular diastolic parameters are  indeterminate.   2. Right ventricular systolic function is normal. The right ventricular  size is normal. There is normal pulmonary artery systolic pressure. The  estimated right ventricular systolic pressure is 38.4 mmHg.   3. Left atrial size was mildly dilated.   4. Right atrial size was mildly dilated.   5. The mitral valve is normal in structure. Mild mitral valve  regurgitation. No evidence of mitral stenosis.   6. The aortic valve is normal in structure. Aortic valve regurgitation is  not visualized. Mild to moderate aortic valve  sclerosis/calcification is  present, without any evidence of aortic stenosis.   7. The inferior vena cava is normal in size with greater than 50%  respiratory variability, suggesting right atrial pressure of 3 mmHg.   Patient Profile     75 y.o. male w/ PMH of HTN, Type 2 DM, PAD (s/p L AKA in 06/2020), PTSD, dementia and schizophrenia who is currently admitted with acute metabolic encephalopathy, SIRS and AKI. Cardiology consulted due to atrial fibrillation with RVR.   Assessment & Plan    1. Paroxysmal Atrial Fibrillation - He was experiencing episodes of atrial fibrillation with RVR last week, requiring initiation of IV Amiodarone. Unclear chronicity but had been documented as having episodes of atrial fibrillation in early June as well. Received IV Amiodarone from 7/1  - 7/4. Stopped yesterday when transferred to the telemetry floor and no PO dosing ordered. Remains on Lopressor 25mg  BID. Will review with MD in regards to starting a short-course of PO Amiodarone for 4-6 weeks with likely discontinuation as an outpatient unless recurrence as he remains at high-risk for recurrence at this time given his medical issues.  - This patients CHA2DS2-VASc Score and unadjusted Ischemic Stroke Rate (% per year) is equal to 4.8 % stroke rate/year from a score of 4 (HTN, DM, Vascular, Age). He has not been started on anticoagulation given his multiple medical issues, including anemia and hematuria this admission.   2. PAD - Followed by Dr. Donnetta Hutching as an outpatient and recently underwent left AKA in 06/2020. Remains on statin therapy.   3. Sepsis in the setting of Klebsiella Bacteremia - Source felt to be due to UTI. Treated with Vancomycin and Cefepime this admission. Now on Ceftriaxone.  4. Hematuria/Anemia - He remains anemic with Hgb at 7.5. Further work-up per the admitting team.   5. AMS/Baseline Dementia - AMS has improved throughout admission. Still with dementia at baseline and has been  restarted on PTA Aricept.   6. 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 Baseline appears to be around 1.1 - 1.2.    For questions or updates, please contact Paulina Please consult [email or web address] for contact info under        Signed, Erma Heritage, PA-C  07/26/2020, 9:41 AM    Attending Note:  Patient seen and examined. Agree with note by Bernerd Pho, PA-C. Mr Jurney has a PMHx of schizophrenia, PAD (L AKA), T2DM, HTN, PTSD, dementia, admitted with encephalopathy, SIRS, AKI and AFIB with RVR. Not an ideal candidate for anticoagulation given compliance concerns, anemia and hematuria. Will continue BB. Also short amiodarone course for 4-6 weeks. Refusing tele today so unable to determine heart rhythm. Last tele data showed sinus rhythm (rate in the 60s). Outpatient cardiology follow-up.  Melina Schools, MD, [facility]

## 2020-07-26 NOTE — Care Management Important Message (Signed)
Important Message  Patient Details  Name: Allen Yu MRN: [id number] Date of Birth: December 30, 1945   Medicare Important Message Given:  Yes (spoke with wife, [name] telephonically at [phone])     [name] 07/26/2020, 12:16 PM

## 2020-07-26 NOTE — Progress Notes (Signed)
Patient frustrated because his IV pump goes off "all the time".  I explained to patient that because of it being located in his [facility], if he bent his arm, it would beep.  I offered to place and new one and this was denied.  I went in the room multiple times to restart the pump after it had a downstream occlusion and reinforced the need to keep his arm straight.  Patient stated, "I haven't moved my fucking arm". Of note, patient had rolled onto his L side from his back and had his left arm supporting his head with his arm bent at the elbow.  When I entered the room to assess patient and give him his night time medications, patient continued cussing and yelling.  I once again offered to start a new one and this was declined. Patient refused his medications and is now refusing his IVF.

## 2020-07-28 ENCOUNTER — Other Ambulatory Visit: Payer: Self-pay | Admitting: *Deleted

## 2020-07-28 NOTE — Patient Outreach (Signed)
[facility]) Care Management  07/28/2020  [name] 1945/04/23 [id number]   [facility] Unsuccessful outreach  [name] was referred to [facility] on 07/01/20 by [facility] hospital liaison for post hospital/complex care services after his left AKA (above the knee amputation) and for disease management of diabetes   Insurance: tricare for life, Medicare Last admissions 07/21/20-07/26/20 Sepsis, AKI,cystitis Klebsiella bacteremia  discharged home with advance home care, [facility] palliative care Seen by Palliative NP, [name] during his hospitalization on 07/26/20 6/7-06/30/20 diabetes left AKA (above the knee amputation)- discharged with services from [facility] & [facility] for oxygen    Outreach attempt to the home number  No answer. [facility] RN CM left HIPAA [facility] Portability and Accountability Act) compliant voicemail message along with CM's contact info.   Plan: [facility] RN CM scheduled this patient for another call attempt within 4-7 business days Unsuccessful outreach on 07/28/20 Completed [facility] multidisciplinary care discussion 08/04/20 template   [name] L. [name], RN, BSN, [place] Coordinator Office number [phone] Mobile number [phone]  Main [facility] number [phone] Fax number [phone]

## 2020-08-01 ENCOUNTER — Other Ambulatory Visit: Payer: Self-pay

## 2020-08-01 ENCOUNTER — Other Ambulatory Visit: Payer: Self-pay | Admitting: *Deleted

## 2020-08-01 DIAGNOSIS — Z659 Problem related to unspecified psychosocial circumstances: Secondary | ICD-10-CM | POA: Insufficient documentation

## 2020-08-01 NOTE — Patient Outreach (Signed)
[facility]) Care Management  08/01/2020  Allen Yu 1945-07-04 [id number]   [facility] outreach to family of complex care patient   Allen Yu was referred to [facility] on 07/01/20 by [facility] hospital liaison for post hospital/complex care services after his left AKA (above the knee amputation) and for disease management of diabetes   Insurance: tricare for life, Medicare Last admissions 07/21/20-07/26/20 Sepsis, AKI,cystitis Klebsiella bacteremia  discharged home with advance home care, [facility] palliative care Seen by Palliative NP, [name] during his hospitalization on 07/26/20 6/7-06/30/20 diabetes left AKA (above the knee amputation)- discharged with services from [facility] & [facility] for oxygen      successful outreach to his wife, Allen Yu The spouse is able to verify HIPAA ([place]) identifiers Reviewed and addressed the purpose of the follow up call   Consent: [facility] RN CM reviewed [facility] services  Verbal consent  Assessment Allen Yu reports she is having social concerns with Allen Yu at this time Patient is starting to want to complete less of his  Activities of daily living (ADLs)  She reports large amounts of money missing from his credit union account after Allen Yu was visited by a male friend plus Allen Yu went to an event recently with a male friend and returned home soiled in stool She is taking precautions for his financials She voices interest in a possible mental health mentor for Allen Yu as she feels he is not coping well with his medical progression as evidence of this reported recent management of money and her reporting he is not wanting to attempt to complete any activities of daily living (bathing, dressing, feeding himself)  Palliative care is scheduled to visit but not until August 11 2020   Outreach to [place] [phone] prompt #3  spoke with  Allen Yu to be updated on the [place] providers for this patient so notes can be faxed to them Dr Allen Yu in the [facility] fax number [phone] Faxes go to the MD's nurse SW Allen Yu fax [phone] office number [phone]  Outreach to primary care provider (PCP)  Pt last physically seen in office on Jun 07 2020, No showed on 07/14/20 Had a telephonic MD outreach on 7/7 or 07/29/20 and is to follow up on 08/12/20 Allen Yu)  Plans  Patient's wife agrees to care plan and follow up within the next 30 business days Pt encouraged to return a call to [facility] RN CM prn   Goals Addressed               This Visit's Progress     Patient Stated     [facility]) Eat Healthy (pt-stated)   On track     Timeframe:  Long-Range Goal Priority:  High Start Date:                     07/05/20        Expected End Date:           09/20/20            Follow Up Date 08/17/20  Barriers: Knowledge    - set a realistic goal - take in non milk based supplement    Notes:  08/01/20 pt eating poorly continues with episodes of diarrhea, social concerns, palliative care visit 08/11/20 07/06/20 diarrhea from milk base boost to try non milk  base product       [facility]) Manage My Emotions (pt-stated)   Not on track     Timeframe:  Short-Term Goal Priority:  High Start Date:                        08/01/20     Expected End Date:       09/21/20                Follow Up Date 08/17/20   Barriers: Knowledge Psychosocial  - call and visit an old friend - join a support group - talk about feelings with a friend, family or spiritual advisor - practice positive thinking and self-talk     Notes:  08/01/20 money reported missing from account, returning home with friends soiled, informing wife wanting to spend all money before pass, starting to do less of ADLs Wife interested in possible VA or Engineer, water for coping       [facility]) Manage My Medicine (pt-stated)   Not on track     Timeframe:   Short-Term Goal Priority:  High Start Date:                         07/05/20    Expected End Date:              07/21/20         Follow Up Date 08/17/20   Barriers: Knowledge  - call for medicine refill 2 or 3 days before it runs out - keep a list of all the medicines I take; vitamins and herbals too -pick up insulin when able from local pharmacy and use mail order prn     Notes: 08/01/20 medication received but patient reported to not be taking medications as ordered/social concerns. palliative care visit 08/11/20 07/06/20 wife will go pick up insulin from pharmacy  07/05/20 pending refill of insulin            Karina Lenderman L. [name], RN, BSN, [place] Coordinator Office number [phone] Main [facility] number [phone] Fax number [phone]

## 2020-08-02 ENCOUNTER — Emergency Department (HOSPITAL_COMMUNITY)
Admission: EM | Admit: 2020-08-02 | Discharge: 2020-08-02 | Disposition: A | Discharge status: Home / Self Care | Payer: Medicare Other | Attending: Emergency Medicine | Admitting: Emergency Medicine

## 2020-08-02 ENCOUNTER — Ambulatory Visit: Payer: Self-pay | Admitting: Urology

## 2020-08-02 ENCOUNTER — Other Ambulatory Visit: Payer: Self-pay

## 2020-08-02 ENCOUNTER — Encounter (HOSPITAL_COMMUNITY): Payer: Self-pay | Admitting: Emergency Medicine

## 2020-08-02 DIAGNOSIS — Z79899 Other long term (current) drug therapy: Secondary | ICD-10-CM | POA: Insufficient documentation

## 2020-08-02 DIAGNOSIS — R339 Retention of urine, unspecified: Secondary | ICD-10-CM | POA: Insufficient documentation

## 2020-08-02 DIAGNOSIS — Z7901 Long term (current) use of anticoagulants: Secondary | ICD-10-CM | POA: Insufficient documentation

## 2020-08-02 DIAGNOSIS — I4891 Unspecified atrial fibrillation: Secondary | ICD-10-CM | POA: Diagnosis not present

## 2020-08-02 DIAGNOSIS — R739 Hyperglycemia, unspecified: Secondary | ICD-10-CM

## 2020-08-02 DIAGNOSIS — E1151 Type 2 diabetes mellitus with diabetic peripheral angiopathy without gangrene: Secondary | ICD-10-CM | POA: Diagnosis not present

## 2020-08-02 DIAGNOSIS — I129 Hypertensive chronic kidney disease with stage 1 through stage 4 chronic kidney disease, or unspecified chronic kidney disease: Secondary | ICD-10-CM | POA: Diagnosis not present

## 2020-08-02 DIAGNOSIS — F17210 Nicotine dependence, cigarettes, uncomplicated: Secondary | ICD-10-CM | POA: Insufficient documentation

## 2020-08-02 DIAGNOSIS — N182 Chronic kidney disease, stage 2 (mild): Secondary | ICD-10-CM | POA: Insufficient documentation

## 2020-08-02 DIAGNOSIS — Z7689 Persons encountering health services in other specified circumstances: Secondary | ICD-10-CM

## 2020-08-02 DIAGNOSIS — F0390 Unspecified dementia without behavioral disturbance: Secondary | ICD-10-CM | POA: Insufficient documentation

## 2020-08-02 DIAGNOSIS — E1165 Type 2 diabetes mellitus with hyperglycemia: Secondary | ICD-10-CM | POA: Insufficient documentation

## 2020-08-02 DIAGNOSIS — E1140 Type 2 diabetes mellitus with diabetic neuropathy, unspecified: Secondary | ICD-10-CM | POA: Insufficient documentation

## 2020-08-02 DIAGNOSIS — Z466 Encounter for fitting and adjustment of urinary device: Secondary | ICD-10-CM | POA: Insufficient documentation

## 2020-08-02 DIAGNOSIS — Z794 Long term (current) use of insulin: Secondary | ICD-10-CM | POA: Diagnosis not present

## 2020-08-02 LAB — BASIC METABOLIC PANEL
Anion gap: 8 (ref 5–15)
BUN: 15 mg/dL (ref 8–23)
CO2: 25 mmol/L (ref 22–32)
Calcium: 8.2 mg/dL — ABNORMAL LOW (ref 8.9–10.3)
Chloride: 100 mmol/L (ref 98–111)
Creatinine, Ser: 1.15 mg/dL (ref 0.61–1.24)
GFR, Estimated: >60 mL/min (ref >60)
Glucose, Bld: 371 mg/dL — ABNORMAL HIGH (ref 70–99)
Potassium: 3.8 mmol/L (ref 3.5–5.1)
Sodium: 133 mmol/L — ABNORMAL LOW (ref 135–145)

## 2020-08-02 LAB — CBG MONITORING, ED
Glucose-Capillary: 422 mg/dL — ABNORMAL HIGH (ref 70–99)
Glucose-Capillary: 361 mg/dL — ABNORMAL HIGH (ref 70–99)

## 2020-08-02 MED ORDER — HYDROCODONE-ACETAMINOPHEN 5-325 MG PO TABS: 1.0000 | ORAL_TABLET | Freq: Every evening | ORAL | 0 refills | Status: DC | PRN

## 2020-08-02 MED ORDER — INSULIN ASPART 100 UNIT/ML IJ SOLN
10.0000 [IU] | Freq: Once | INTRAMUSCULAR | Status: AC
  Administered 2020-08-02: 10 [IU] via SUBCUTANEOUS
  Filled 2020-08-02: qty 1

## 2020-08-02 NOTE — Progress Notes (Deleted)
08/02/2020 8:32 AM   Allen Yu 18-Jul-1945 [id number]  Referring provider: [name], MD [address] [place],  [place] [address]  No chief complaint on file.   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  He was discharged from the hospital on 7 5 with a Foley in place.  He was prescribed 10 days of cefdinir to complete a total of 14 days of therapy.  He is also started on Flomax.  Notably, he was admitted for multiple issues   PMH: Past Medical History:  Diagnosis Date   Anemia    Arthritis    Carotid stenosis    Chronic back pain    Chronic kidney disease    Constipation    Dementia (HCC)    Diabetes mellitus    GERD (gastroesophageal reflux disease)    History of kidney stones    Hypertension    Lung nodule    PAF (paroxysmal atrial fibrillation) (HCC)    Peripheral neuropathy    Peripheral vascular disease (HCC)    PTSD (post-traumatic stress disorder)    Schizophrenia ([place])    Tuberculosis    Treated    Surgical History: Past Surgical History:  Procedure Laterality Date   ABDOMINAL AORTOGRAM W/LOWER EXTREMITY N/A 05/20/2020   Procedure: ABDOMINAL AORTOGRAM W/LOWER EXTREMITY;  Surgeon: Elam Dutch, MD;  Location: [facility];  Service: Cardiovascular;  Laterality: N/A;   AMPUTATION Left 06/28/2020   Procedure: AMPUTATION ABOVE KNEE LEFT;  Surgeon: Rosetta Posner, MD;  Location: [facility] OR;  Service: Vascular;  Laterality: Left;   [place], 2013   x2   BLADDER SURGERY     02   CHOLECYSTECTOMY     COLONOSCOPY     COLONOSCOPY N/A 12/07/2014   Procedure: COLONOSCOPY;  Surgeon: Daneil Dolin, MD;  [facility];  Service: Endoscopy;  Laterality: N/A;  10:30 Am   EYE SURGERY Bilateral    removed metal from eye   FEMORAL-TIBIAL BYPASS GRAFT Left 05/27/2020   Procedure: LEFT FEMORAL TO  PERONEAL ARTERY BYPASS;  Surgeon: Rosetta Posner, MD;  Location: [facility];  Service: Vascular;  Laterality: Left;   HERNIA REPAIR Right    INGUINAL HERNIA REPAIR Left 11/03/2013   Procedure: HERNIA REPAIR INGUINAL ADULT;  Surgeon: Gayland Curry, MD;  Location: [place];  Service: General;  Laterality: Left;   SHOULDER SURGERY     RIGHT SHOULDER    VIDEO BRONCHOSCOPY WITH ENDOBRONCHIAL NAVIGATION N/A 07/10/2019   Procedure: VIDEO BRONCHOSCOPY WITH ENDOBRONCHIAL NAVIGATION;  Surgeon: Melrose Nakayama, MD;  Location: [place];  Service: Thoracic;  Laterality: N/A;    Home Medications:  Allergies as of 08/02/2020       Reactions   Bee Venom Anaphylaxis   Codeine Other (See Comments)   incoherent    Diovan [valsartan] Other (See Comments)   incoherent   Propoxyphene Other (See Comments)   Dizziness, "Makes me feel drunk"        Medication List        Accurate as of August 02, 2020  8:32 AM. If you have any questions, ask your nurse or doctor.          acetaminophen 500 MG tablet Commonly known as: TYLENOL Take 500 mg by mouth every 6 (six) hours as needed for moderate pain.   amiodarone 200 MG tablet Commonly known  as: PACERONE Take 1 tablet (200 mg total) by mouth daily.   apixaban 2.5 MG Tabs tablet Commonly known as: ELIQUIS Take 1 tablet (2.5 mg total) by mouth 2 (two) times daily.   atorvastatin 40 MG tablet Commonly known as: LIPITOR Take 1 tablet (40 mg total) by mouth at bedtime.   B-D UF III MINI PEN NEEDLES 31G X 5 MM Misc Generic drug: Insulin Pen Needle SMARTSIG:1 Each SUB-Q Daily   cefdinir 300 MG capsule Commonly known as: OMNICEF Take 1 capsule (300 mg total) by mouth 2 (two) times daily.   ciprofloxacin 500 MG tablet Commonly known as: CIPRO   donepezil 10 MG tablet Commonly known as: ARICEPT Take 10 mg by mouth at bedtime.   gabapentin 100 MG capsule Commonly known as: NEURONTIN Take 100 mg by mouth at bedtime.   Lantus 100 UNIT/ML  injection Generic drug: insulin glargine Inject 0.1 mLs (10 Units total) into the skin at bedtime as needed (High blood glucose).   metoprolol tartrate 25 MG tablet Commonly known as: LOPRESSOR Take 1 tablet (25 mg total) by mouth 2 (two) times daily.   multivitamin with minerals tablet Take 1 tablet by mouth daily. 50 plus   nicotine 21 mg/24hr patch Commonly known as: NICODERM CQ - dosed in mg/24 hours Place 1 patch (21 mg total) onto the skin daily as needed (Cessation of smoking).   oxyCODONE-acetaminophen 5-325 MG tablet Commonly known as: Percocet Take 1 tablet by mouth every 6 (six) hours as needed for severe pain. Do not take with Acetaminophen (Tylenol)   tamsulosin 0.4 MG Caps capsule Commonly known as: FLOMAX Take 1 capsule (0.4 mg total) by mouth daily.   traMADol 50 MG tablet Commonly known as: ULTRAM Take 50 mg by mouth every 6 (six) hours as needed for moderate pain.        Allergies:  Allergies  Allergen Reactions   Bee Venom Anaphylaxis   Codeine Other (See Comments)    incoherent    Diovan [Valsartan] Other (See Comments)    incoherent   Propoxyphene Other (See Comments)    Dizziness, "Makes me feel drunk"    Family History: Family History  Problem Relation Age of Onset   Heart disease Mother        before age 66    Social History:  reports that he has been smoking cigarettes. He has been smoking an average of 0.50 packs per day. He has never used smokeless tobacco. He reports current drug use. Drug: Marijuana. He reports that he does not drink alcohol.   Physical Exam: There were no vitals taken for this visit.  Constitutional:  Alert and oriented, No acute distress. HEENT: [place] AT, moist mucus membranes.  Trachea midline, no masses. Cardiovascular: No clubbing, cyanosis, or edema. Respiratory: Normal respiratory effort, no increased work of breathing. GI: Abdomen is soft, nontender, nondistended, no abdominal masses GU: No CVA  tenderness Lymph: No cervical or inguinal lymphadenopathy. Skin: No rashes, bruises or suspicious lesions. Neurologic: Grossly intact, no focal deficits, moving all 4 extremities. Psychiatric: Normal mood and affect.  Laboratory Data: Lab Results  Component Value Date   WBC 15.3 (H) 07/26/2020   HGB 7.5 (L) 07/26/2020   HCT 22.6 (L) 07/26/2020   MCV 93.0 07/26/2020   PLT 81 (L) 07/26/2020    Lab Results  Component Value Date   CREATININE 1.57 (H) 07/26/2020    No results found for: PSA  No results found for: TESTOSTERONE  Lab Results  Component Value  Date   HGBA1C 8.7 (H) 07/24/2020    Urinalysis    Component Value Date/Time   COLORURINE RED (A) 07/22/2020 1350   APPEARANCEUR CLOUDY (A) 07/22/2020 1350   LABSPEC 1.013 07/22/2020 1350   PHURINE 7.0 07/22/2020 1350   GLUCOSEU 50 (A) 07/22/2020 1350   HGBUR LARGE (A) 07/22/2020 1350   BILIRUBINUR NEGATIVE 07/22/2020 1350   KETONESUR 5 (A) 07/22/2020 1350   PROTEINUR 100 (A) 07/22/2020 1350   UROBILINOGEN 0.2 02/18/2014 0950   NITRITE NEGATIVE 07/22/2020 1350   LEUKOCYTESUR MODERATE (A) 07/22/2020 1350    Lab Results  Component Value Date   BACTERIA NONE SEEN 07/22/2020    Pertinent Imaging: *** Results for orders placed during the hospital encounter of 02/23/03  DG Abd 1 View  Narrative Clinical Data: Right renal calculus. ABDOMEN ONE VIEW The bowel gas pattern is normal. There are some calcifications of the pelvis which are felt to be vascular in origin. There is no discrete right renal stone or ureteral stone. However, there is extensive stool in the right side of the colon overlying the right kidney, which could obscure a small stone. Bony structures are normal. IMPRESSION No significant abnormality. Specifically, I do not see a discrete right ureteral or renal stone.  Provider: Davene Costain  No results found for this or any previous visit.  No results found for this or any previous visit.  No  results found for this or any previous visit.  No results found for this or any previous visit.  No results found for this or any previous visit.  No results found for this or any previous visit.  No results found for this or any previous visit.   Assessment & Plan:    There are no diagnoses linked to this encounter.  No follow-ups on file.  Hollice Espy, MD  [facility] [address], [place], [place] [address] [phone]

## 2020-08-02 NOTE — Discharge Instructions (Addendum)
Keep your appointment with the urologist tomorrow for further advice and treatment so that you can ultimately get rid of his Foley catheter.  Today's attempt was not successful since you are not able to empty your bladder.  You have been prescribed some medication for pain to help you sleep better while awaiting removal of this catheter.  Your blood glucose level remains elevated, but there is no indication for admission due to this finding.  It is important that you are compliant with your Lantus medication however.  Check your blood glucose several times tomorrow and watch your diet closely avoiding foods that you know will elevate your blood glucose.  Call Dr. Willey Blade for an office visit within the next week for recheck of your diabetes.

## 2020-08-02 NOTE — ED Notes (Signed)
Pt wheeled out before I could have them sign discharge paperwork.

## 2020-08-02 NOTE — ED Triage Notes (Signed)
Pt brought in to ED due to pain to his penis due to having a foley catheter in place. Pt wants foley removed due to pain. Foley was placed due to urinary retention.

## 2020-08-02 NOTE — ED Notes (Signed)
Bladder scan 440ml

## 2020-08-02 NOTE — ED Notes (Signed)
Pt. Refusing catheter at this time. Pt. Is attempting to use the urinal right now.

## 2020-08-02 NOTE — ED Provider Notes (Signed)
[facility] EMERGENCY DEPARTMENT Provider Note   CSN: [id number] Arrival date & time: 08/02/20  1004     History Chief Complaint  Patient presents with   Foley Problem    Allen Yu is a 75 y.o. male presenting for evaluation of his Foley catheter.  He was discharged from this hospital on July 5 during which time he was treated for sepsis and Klebsiella bacteremia with the source being his urine, he had significant emphysematous cystitis and was discharged home with a Foley catheter with plans for follow-up urology care.  Wife at the bedside states that he has had increasing complaints of pain at the tip of his penis and is insistent on removal of the Foley catheter at this time.  Unfortunately, he was scheduled to see urology this morning for voiding trial and hopeful removal of the catheter, however they were confused about this visit, instead presenting at the PCPs office therefore missing his urology appointment.  This has been rescheduled for tomorrow morning, however patient is adamant that he wants the Foley removed today.  He has had no fevers or chills, wife states that he has been urinating normally with no problems with cloudy or dark urine, he has had no fevers and is at his baseline mentation which includes moderate dementia.  He is also hard of hearing so difficult to communicate with.  He has completed a full course of cefdinir.  The history is provided by the patient and the spouse. The history is limited by the condition of the patient (pt has dementia).      Past Medical History:  Diagnosis Date   Anemia    Arthritis    Carotid stenosis    Chronic back pain    Chronic kidney disease    Constipation    Dementia (HCC)    Diabetes mellitus    GERD (gastroesophageal reflux disease)    History of kidney stones    Hypertension    Lung nodule    PAF (paroxysmal atrial fibrillation) (HCC)    Peripheral neuropathy    Peripheral vascular disease (HCC)    PTSD  (post-traumatic stress disorder)    Schizophrenia ([place])    Tuberculosis    Treated    Patient Active Problem List   Diagnosis Date Noted   Problem related to unspecified psychosocial circumstances 08/01/2020   Hypermetropia 07/22/2020   AF (paroxysmal atrial fibrillation) ([place]) 07/22/2020   Klebsiella sepsis ([place]) 07/22/2020   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   SIRS (systemic inflammatory response syndrome) ([place]) 47/09/6281   Acute metabolic encephalopathy 66/29/4765   Hyponatremia    S/P AKA (above knee amputation) unilateral, left ([place]) 06/28/2020   Gangrene of left foot ([place]) 06/28/2020   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 ([place]) 05/27/2020   Critical lower limb ischemia ([place]) 05/21/2020   Protein-calorie malnutrition, severe 05/19/2020   Plantar callus 12/24/2019   Cavitary pneumonia 09/14/2019   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   Unintentional weight loss 09/14/2019   Coronary artery calcification seen on CT scan 10/28/2018   Hilar adenopathy 04/10/2016   Lung nodule seen on imaging study 07/09/2015   Acute encephalopathy    Hypoglycemia due to insulin    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[place]) 07/08/2015   Acute kidney injury superimposed on chronic kidney disease ([place]) 07/08/2015   Tobacco use disorder 07/08/2015   Diverticulosis of colon without hemorrhage    Left inguinal hernia 09/16/2013   Altered mental status 11/17/2012   Hypothermia  11/17/2012   Leukocytosis 11/17/2012   CKD (chronic kidney disease), stage II    PTSD (post-traumatic stress disorder)    GERD (gastroesophageal reflux disease)    Essential (primary) hypertension 09/10/2011   Anemia in chronic illness 09/10/2011   Diabetic neuropathy ([place]) 09/10/2011   DDD (degenerative disc disease), lumbar 09/10/2011   Psychosis ([place]) 09/10/2011   Diabetes mellitus type 2, uncontrolled ([place]) 09/10/2011   HLD (hyperlipidemia) 09/10/2011   Microalbuminuria 09/10/2011     Past Surgical History:  Procedure Laterality Date   ABDOMINAL AORTOGRAM W/LOWER EXTREMITY N/A 05/20/2020   Procedure: ABDOMINAL AORTOGRAM W/LOWER EXTREMITY;  Surgeon: Elam Dutch, MD;  Location: [facility];  Service: Cardiovascular;  Laterality: N/A;   AMPUTATION Left 06/28/2020   Procedure: AMPUTATION ABOVE KNEE LEFT;  Surgeon: Rosetta Posner, MD;  Location: [facility] OR;  Service: Vascular;  Laterality: Left;   [place], 2013   x2   BLADDER SURGERY     02   CHOLECYSTECTOMY     COLONOSCOPY     COLONOSCOPY N/A 12/07/2014   Procedure: COLONOSCOPY;  Surgeon: Daneil Dolin, MD;  [facility];  Service: Endoscopy;  Laterality: N/A;  10:30 Am   EYE SURGERY Bilateral    removed metal from eye   FEMORAL-TIBIAL BYPASS GRAFT Left 05/27/2020   Procedure: LEFT FEMORAL TO PERONEAL ARTERY BYPASS;  Surgeon: Rosetta Posner, MD;  Location: [facility];  Service: Vascular;  Laterality: Left;   HERNIA REPAIR Right    INGUINAL HERNIA REPAIR Left 11/03/2013   Procedure: HERNIA REPAIR INGUINAL ADULT;  Surgeon: Gayland Curry, MD;  Location: [facility];  Service: General;  Laterality: Left;   SHOULDER SURGERY     RIGHT SHOULDER    VIDEO BRONCHOSCOPY WITH ENDOBRONCHIAL NAVIGATION N/A 07/10/2019   Procedure: VIDEO BRONCHOSCOPY WITH ENDOBRONCHIAL NAVIGATION;  Surgeon: Melrose Nakayama, MD;  Location: [facility] OR;  Service: Thoracic;  Laterality: N/A;       Family History  Problem Relation Age of Onset   Heart disease Mother        before age 75    Social History   Tobacco Use   Smoking status: Every Day    Packs/day: 0.50    Pack years: 0.00    Types: Cigarettes   Smokeless tobacco: Never   Tobacco comments:    burns them up  Vaping Use   Vaping Use: Never used  Substance Use Topics   Alcohol use: No    Alcohol/week: 0.0 standard drinks   Drug use: Yes    Types: Marijuana    Comment: almost daily for pain    Home Medications Prior to Admission  medications   Medication Sig Start Date End Date Taking? Authorizing Provider  acetaminophen (TYLENOL) 500 MG tablet Take 500 mg by mouth every 6 (six) hours as needed for moderate pain.    [provider]  amiodarone (PACERONE) 200 MG tablet Take 1 tablet (200 mg total) by mouth daily. 07/26/20   Orson Eva, MD  apixaban (ELIQUIS) 2.5 MG TABS tablet Take 1 tablet (2.5 mg total) by mouth 2 (two) times daily. 07/26/20   Orson Eva, MD  atorvastatin (LIPITOR) 40 MG tablet Take 1 tablet (40 mg total) by mouth at bedtime. 05/21/20 08/19/20  [place] Eric J, DO  B-D UF III MINI PEN NEEDLES 31G X 5 MM MISC SMARTSIG:1 Each SUB-Q Daily 12/23/19   [provider]  cefdinir (OMNICEF) 300 MG capsule Take  1 capsule (300 mg total) by mouth 2 (two) times daily. 07/26/20   Orson Eva, MD  ciprofloxacin (CIPRO) 500 MG tablet  07/26/20   [provider]  donepezil (ARICEPT) 10 MG tablet Take 10 mg by mouth at bedtime.  06/28/16   [provider]  gabapentin (NEURONTIN) 100 MG capsule Take 100 mg by mouth at bedtime. 11/06/19   [provider]  LANTUS 100 UNIT/ML injection Inject 0.1 mLs (10 Units total) into the skin at bedtime as needed (High blood glucose). 06/30/20   Mercy Riding, MD  metoprolol tartrate (LOPRESSOR) 25 MG tablet Take 1 tablet (25 mg total) by mouth 2 (two) times daily. 06/30/20 06/30/21  Mercy Riding, MD  Multiple Vitamins-Minerals (MULTIVITAMIN WITH MINERALS) tablet Take 1 tablet by mouth daily. 50 plus    [provider]  nicotine (NICODERM CQ - DOSED IN MG/24 HOURS) 21 mg/24hr patch Place 1 patch (21 mg total) onto the skin daily as needed (Cessation of smoking). 06/30/20   Mercy Riding, MD  oxyCODONE-acetaminophen (PERCOCET) 5-325 MG tablet Take 1 tablet by mouth every 6 (six) hours as needed for severe pain. Do not take with Acetaminophen (Tylenol) 07/01/20 07/01/21  Baglia, Corrina, PA-C  tamsulosin (FLOMAX) 0.4 MG CAPS capsule Take 1 capsule (0.4 mg total)  by mouth daily. 07/26/20   Orson Eva, MD  traMADol (ULTRAM) 50 MG tablet Take 50 mg by mouth every 6 (six) hours as needed for moderate pain. 07/18/20   [provider]    Allergies    Bee venom, Codeine, Diovan [valsartan], and Propoxyphene  Review of Systems   Review of Systems  Constitutional:  Negative for fever.  HENT:  Negative for congestion and sore throat.   Eyes: Negative.   Respiratory:  Negative for chest tightness and shortness of breath.   Cardiovascular:  Negative for chest pain.  Gastrointestinal:  Negative for abdominal pain and nausea.  Genitourinary:  Positive for penile pain. Negative for decreased urine volume, dysuria, flank pain, frequency, hematuria, penile discharge and urgency.  Musculoskeletal:  Negative for arthralgias, joint swelling and neck pain.  Skin: Negative.  Negative for rash and wound.  Neurological:  Negative for dizziness, weakness, light-headedness, numbness and headaches.  Psychiatric/Behavioral: Negative.    All other systems reviewed and are negative.  Physical Exam Updated Vital Signs BP (!) 156/70 (BP Location: Left Arm)   Pulse (!) 55   Temp 97.9 F (36.6 C) (Oral)   Resp 17   Ht 6\' 1"  (1.854 m)   Wt 44.5 kg   SpO2 100%   BMI 12.94 kg/m   Physical Exam Vitals and nursing note reviewed. Exam conducted with a chaperone present.  Constitutional:      Appearance: He is well-developed.  HENT:     Head: Normocephalic and atraumatic.  Eyes:     Conjunctiva/sclera: Conjunctivae normal.  Cardiovascular:     Rate and Rhythm: Normal rate and regular rhythm.     Heart sounds: Normal heart sounds.  Pulmonary:     Effort: Pulmonary effort is normal.     Breath sounds: Normal breath sounds. No wheezing.  Abdominal:     General: Bowel sounds are normal. There is no distension.     Palpations: Abdomen is soft.     Tenderness: There is no abdominal tenderness. There is no guarding.  Genitourinary:    Penis: Normal. No  discharge, swelling or lesions.      Testes: Normal.     Comments: Foley in  place. Musculoskeletal:        General: Normal range of motion.     Cervical back: Normal range of motion.  Skin:    General: Skin is warm and dry.  Neurological:     Mental Status: He is alert.    ED Results / Procedures / Treatments   Labs (all labs ordered are listed, but only abnormal results are displayed) Labs Reviewed  BASIC METABOLIC PANEL - Abnormal; Notable for the following components:      Result Value   Sodium 133 (*)    Glucose, Bld 371 (*)    Calcium 8.2 (*)    All other components within normal limits  CBG MONITORING, ED - Abnormal; Notable for the following components:   Glucose-Capillary 422 (*)    All other components within normal limits  CBG MONITORING, ED - Abnormal; Notable for the following components:   Glucose-Capillary 361 (*)    All other components within normal limits    EKG None  Radiology No results found.  Procedures Procedures   Medications Ordered in ED Medications  insulin aspart (novoLOG) injection 10 Units (has no administration in time range)    ED Course  I have reviewed the triage vital signs and the nursing notes.  Pertinent labs & imaging results that were available during my care of the patient were reviewed by me and considered in my medical decision making (see chart for details).    MDM Rules/Calculators/A&P                          Discussed with patient and wife at the bedside that we can remove his Foley catheter but he will need to stay here until he urinates.  If he is unable to urinate we will need to replace if Foley until he can follow-up with urology or he will run into complications with his bladder and his kidneys.  Patient and wife understand this treatment plan.  The Foley was removed after which he states he felt completely pain-free and was very happy.  Unfortunately we observed him for 3 hours, also given p.o. fluids, he did  not urinate.  Bladder scan revealing 453 mL of urine retained.  Patient was advised that we will have to replace the Foley and he will need to follow-up with urology tomorrow as has been scheduled.  He understands this plan.  Labs were obtained, specifically of being that given his recent acute kidney injury.  His creatinine is much improved today at 1.15.  Unfortunately his glucose is very elevated at 371.  He does not have an anion gap.  He was given insulin subcu 10 units.  We will recheck a CBG and if his glucose level is improving we will plan discharge for home with plans for follow-up care with urology tomorrow.   Glucose is starting to improve, no dka so stable for dc home. Advised sister at bedside to recheck several times daily with close pcp f/u.  Pt was unable to urinate even though given po fluids and time.  Foley replaced and it drained appropriately.  He does complaint of discomfort with foley, asked for medicine to help with pain for sleeping.  He has f/u with urology tomorrow - advised to keep this appt.  Final Clinical Impression(s) / ED Diagnoses Final diagnoses:  Encounter for assessment of Foley catheter  Urinary retention  Hyperglycemia    Rx / DC Orders ED Discharge Orders  None        Evalee Jefferson, Hershal Coria 08/02/20 1836    Sherwood Gambler, MD 08/05/20 [phone]

## 2020-08-02 NOTE — ED Notes (Signed)
Foley removed w/o complication

## 2020-08-02 NOTE — ED Notes (Addendum)
Pt. Has water at bedside. This nurse offered some more to drink pt. Refused a drink. Pt. Stated " I am trying to get a nap in. I feel good honey." This nurse notified pt. That we need them to provide a urine sample.

## 2020-08-03 ENCOUNTER — Ambulatory Visit: Payer: Self-pay | Admitting: Urology

## 2020-08-03 ENCOUNTER — Other Ambulatory Visit: Payer: Self-pay | Admitting: *Deleted

## 2020-08-03 MED FILL — Hydrocodone-Acetaminophen Tab 5-325 MG: ORAL | Qty: 6 | Status: AC

## 2020-08-03 NOTE — Progress Notes (Signed)
POST OPERATIVE OFFICE NOTE    CC:  F/u for surgery  HPI:  This is a 75 y.o. male who is s/p left AKA on 06/28/2020 by Dr. Donnetta Hutching.     He had presented with critical limb ischemia and a heel ulcer.  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  He underwent left femoral to above-knee popliteal bypass with Gore-Tex on 05/27/2020.  He had extensive popliteal disease.  His vein was harvested in his entirety and was not usable for bypass and therefore underwent bypass to the popliteal above the occlusion. His wounds continued to worsen and subsequently had amputation.  At the time of his aortogram, he did have diffusely diseased multiple segment 90% stenosis SFA; diffusely diseased popliteal one vessel peroneal runoff to the right foot.   He had carotid duplex in April 2022 that revealed 1-39% bilateral ICA stenosis  Pt returns today for follow up & accompanied by his wife.  Pt wife states his stump is doing well and healing.  She is concerned about darkened areas on his toes on the right.  He has not complained of any pain in the right foot.  Previous ABI on the right in April 2022 was 0.85 with toe pressure of 15.  Aortogram on 05/20/2020 revealed that the right leg had diffusely diseased multiple segment 90% stenosis SFA diffusely diseased popliteal one-vessel peroneal runoff to the right foot.  Allergies  Allergen Reactions   Bee Venom Anaphylaxis   Codeine Other (See Comments)    incoherent    Diovan [Valsartan] Other (See Comments)    incoherent   Propoxyphene Other (See Comments)    Dizziness, "Makes me feel drunk"    Current Outpatient Medications  Medication Sig Dispense Refill   acetaminophen (TYLENOL) 500 MG tablet Take 500 mg by mouth every 6 (six) hours as needed for moderate pain.     amiodarone (PACERONE) 200 MG tablet Take 1 tablet (200 mg total) by mouth daily. 30 tablet 1   apixaban (ELIQUIS) 2.5 MG TABS tablet Take 1 tablet (2.5 mg total) by mouth  2 (two) times daily. 60 tablet 1   atorvastatin (LIPITOR) 40 MG tablet Take 1 tablet (40 mg total) by mouth at bedtime. 90 tablet 0   B-D UF III MINI PEN NEEDLES 31G X 5 MM MISC SMARTSIG:1 Each SUB-Q Daily     cefdinir (OMNICEF) 300 MG capsule Take 1 capsule (300 mg total) by mouth 2 (two) times daily. 20 capsule 0   ciprofloxacin (CIPRO) 500 MG tablet      donepezil (ARICEPT) 10 MG tablet Take 10 mg by mouth at bedtime.      gabapentin (NEURONTIN) 100 MG capsule Take 100 mg by mouth at bedtime.     HYDROcodone-acetaminophen (NORCO/VICODIN) 5-325 MG tablet Take 1 tablet by mouth at bedtime as needed for severe pain or moderate pain. 6 tablet 0   LANTUS 100 UNIT/ML injection Inject 0.1 mLs (10 Units total) into the skin at bedtime as needed (High blood glucose). 10 mL 11   metoprolol tartrate (LOPRESSOR) 25 MG tablet Take 1 tablet (25 mg total) by mouth 2 (two) times daily. 180 tablet 3   Multiple Vitamins-Minerals (MULTIVITAMIN WITH MINERALS) tablet Take 1 tablet by mouth daily. 50 plus     nicotine (NICODERM CQ - DOSED IN MG/24 HOURS) 21 mg/24hr patch Place 1 patch (21 mg total) onto the skin daily as needed (Cessation of smoking). 28 patch 0   oxyCODONE-acetaminophen (PERCOCET) 5-325 MG tablet Take  1 tablet by mouth every 6 (six) hours as needed for severe pain. Do not take with Acetaminophen (Tylenol) 30 tablet 0   tamsulosin (FLOMAX) 0.4 MG CAPS capsule Take 1 capsule (0.4 mg total) by mouth daily. 30 capsule 1   traMADol (ULTRAM) 50 MG tablet Take 50 mg by mouth every 6 (six) hours as needed for moderate pain.     No current facility-administered medications for this visit.     ROS:  See HPI  Physical Exam:  Today's Vitals   08/04/20 1328  BP: (!) 145/70  Pulse: 66  Resp: 20  Temp: 98.2 F (36.8 C)  TempSrc: Temporal  SpO2: 100%  Height: 6\' 1"  (1.854 m)   Body mass index is 12.94 kg/m.   Incision:  left AKA incision has healed nicely.  Extremities:  right foot without any  sores; monophasic DP/peroneal    Assessment/Plan:  This is a 75 y.o. male who is s/p: left AKA on 06/28/2020 by Dr. Donnetta Hutching.  -staples removed and pt tolerated well.   -he will need f/u right ABI in 3 months.  Wife knows to call sooner if he develops any new wounds.     Leontine Locket, [facility] [phone]   Clinic MD:  Oneida Alar

## 2020-08-03 NOTE — Patient Outreach (Signed)
[facility]) Care Management  08/03/2020  Allen Yu 1945-09-27 [id number]   [facility] care coordination for complex care patient   Allen Yu was referred to [facility] on 07/01/20 by [facility] hospital liaison for post hospital/complex care services after his left AKA (above the knee amputation) and for disease management of diabetes   Insurance: tricare for life, Medicare  Last admissions 08/01/20 ED wanting foley removed, Pain at insertion site, foley removed, did not void, foley re inserted, referred to urology for 08/02/20 (missed 08/01/20 urology appointment) 07/21/20-07/26/20 Sepsis, AKI,cystitis Klebsiella bacteremia  discharged home with advance home care, [facility] palliative care Seen by Palliative NP, [name] during his hospitalization on 07/26/20 6/7-06/30/20 diabetes left AKA (above the knee amputation)- discharged with [facility] from [facility] & [facility] for oxygen    Care coordination [facility] RN CM notes sent via EPIC fax to [place] MD and SW   Outreach to [employer] ([place] SW, Kellie Simmering [phone] x [phone] No answer Called main number and provided with SW extension of x [phone] A voice message was left for [place], SW requesting a return call, informing her of wife voices concern with pt coping and that he is scheduled to be seen at the [place] clinic 08/03/20  Plan Patient's wife agrees to care plan and follow up within the next 30 business days Pt encouraged to return a call to [facility] RN CM prn  Amaziah Ghosh L. [name], RN, BSN, [place] Coordinator Office number [phone] Main [facility] number [phone] Fax number [phone]

## 2020-08-04 ENCOUNTER — Other Ambulatory Visit: Payer: Self-pay | Admitting: *Deleted

## 2020-08-04 ENCOUNTER — Other Ambulatory Visit: Payer: Self-pay

## 2020-08-04 ENCOUNTER — Ambulatory Visit (INDEPENDENT_AMBULATORY_CARE_PROVIDER_SITE_OTHER): Payer: Medicare Other | Admitting: Physician Assistant

## 2020-08-04 VITALS — BP 145/70 | HR 66 | Temp 98.2°F | Resp 20 | Ht 73.0 in

## 2020-08-04 DIAGNOSIS — I739 Peripheral vascular disease, unspecified: Secondary | ICD-10-CM

## 2020-08-04 NOTE — Patient Outreach (Signed)
[facility]) Care Management  08/04/2020  Allen Yu 07/02/1945 [id number]   [place] coordination-  [facility] multidisciplinary care discussion (MCD)    [facility] RN CM reviewed Epic notes [facility] RN CM reviewed and provided an update on patient status and progression to the [facility] MCD team-  Reviewed 7/13-14/22 EPIC notes [facility] RN CM answered questions for MCD team  Allen Yu was referred to [facility] on 07/01/20 by [facility] hospital liaison for post hospital/complex care services after his left AKA (above the knee amputation) and for disease management of diabetes   Insurance: tricare for life, Medicare   Last admissions 08/01/20 ED wanting foley removed, Pain at insertion site, foley removed, did not void, foley re inserted, referred to urology for 08/02/20 (missed 08/01/20 urology appointment) 07/21/20-07/26/20 Sepsis, AKI,cystitis Klebsiella bacteremia  discharged home with advance home care, [facility] palliative care Seen by Palliative NP, [name] during his hospitalization on 07/26/20 6/7-06/30/20 diabetes left AKA (above the knee amputation)- discharged with services from [facility] & [facility] for oxygen    Recommendations - follow up with pt/wife for update  Plan [facility] RN CM will follow up with within the next 30 business days E-mailed wife [name] flyer Routed note to MDs  Goals Addressed               This Visit's Progress     Patient Stated     [facility]) Manage My Emotions (pt-stated)   On track     Timeframe:  Short-Term Goal Priority:  High Start Date:                        08/01/20     Expected End Date:       09/21/20                Follow Up Date 08/17/20   Barriers: Knowledge Psychosocial  - call and visit an old friend - join a support group - talk about feelings with a friend, family or spiritual advisor - practice positive thinking and self-talk     Notes:  08/04/20 seen at [facility]  08/03/20 08/01/20 money reported missing from account, returning home with friends soiled, informing wife wanting to spend all money before pass, starting to do less of ADLs Wife interested in possible [facility] or Engineer, water for [place]. [name], RN, BSN, [place] Coordinator Office number [phone] Mobile number [phone]  Main [facility] number [phone] Fax number [phone]

## 2020-08-08 ENCOUNTER — Other Ambulatory Visit: Payer: Self-pay

## 2020-08-08 DIAGNOSIS — I739 Peripheral vascular disease, unspecified: Secondary | ICD-10-CM

## 2020-08-10 DIAGNOSIS — K659 Peritonitis, unspecified: Secondary | ICD-10-CM | POA: Diagnosis not present

## 2020-08-10 DIAGNOSIS — R338 Other retention of urine: Secondary | ICD-10-CM | POA: Diagnosis not present

## 2020-08-10 DIAGNOSIS — A4159 Other Gram-negative sepsis: Secondary | ICD-10-CM | POA: Diagnosis not present

## 2020-08-10 DIAGNOSIS — N3080 Other cystitis without hematuria: Secondary | ICD-10-CM | POA: Diagnosis not present

## 2020-08-12 DIAGNOSIS — Z79899 Other long term (current) drug therapy: Secondary | ICD-10-CM | POA: Diagnosis not present

## 2020-08-12 DIAGNOSIS — E440 Moderate protein-calorie malnutrition: Secondary | ICD-10-CM | POA: Diagnosis not present

## 2020-08-12 DIAGNOSIS — I480 Paroxysmal atrial fibrillation: Secondary | ICD-10-CM | POA: Diagnosis not present

## 2020-08-12 DIAGNOSIS — E1122 Type 2 diabetes mellitus with diabetic chronic kidney disease: Secondary | ICD-10-CM | POA: Diagnosis not present

## 2020-08-12 DIAGNOSIS — N179 Acute kidney failure, unspecified: Secondary | ICD-10-CM | POA: Diagnosis not present

## 2020-08-12 DIAGNOSIS — A419 Sepsis, unspecified organism: Secondary | ICD-10-CM | POA: Diagnosis not present

## 2020-08-15 ENCOUNTER — Other Ambulatory Visit: Payer: Self-pay | Admitting: *Deleted

## 2020-08-15 ENCOUNTER — Encounter (HOSPITAL_COMMUNITY): Payer: Self-pay | Admitting: *Deleted

## 2020-08-15 ENCOUNTER — Emergency Department (HOSPITAL_COMMUNITY)
Admission: EM | Admit: 2020-08-15 | Discharge: 2020-08-15 | Disposition: A | Discharge status: Home / Self Care | Payer: Medicare Other | Attending: Emergency Medicine | Admitting: Emergency Medicine

## 2020-08-15 ENCOUNTER — Other Ambulatory Visit: Payer: Self-pay

## 2020-08-15 DIAGNOSIS — E1151 Type 2 diabetes mellitus with diabetic peripheral angiopathy without gangrene: Secondary | ICD-10-CM | POA: Insufficient documentation

## 2020-08-15 DIAGNOSIS — E1140 Type 2 diabetes mellitus with diabetic neuropathy, unspecified: Secondary | ICD-10-CM | POA: Diagnosis not present

## 2020-08-15 DIAGNOSIS — F17210 Nicotine dependence, cigarettes, uncomplicated: Secondary | ICD-10-CM | POA: Diagnosis not present

## 2020-08-15 DIAGNOSIS — N182 Chronic kidney disease, stage 2 (mild): Secondary | ICD-10-CM | POA: Insufficient documentation

## 2020-08-15 DIAGNOSIS — F0390 Unspecified dementia without behavioral disturbance: Secondary | ICD-10-CM | POA: Diagnosis not present

## 2020-08-15 DIAGNOSIS — I129 Hypertensive chronic kidney disease with stage 1 through stage 4 chronic kidney disease, or unspecified chronic kidney disease: Secondary | ICD-10-CM | POA: Insufficient documentation

## 2020-08-15 DIAGNOSIS — I10 Essential (primary) hypertension: Secondary | ICD-10-CM | POA: Diagnosis not present

## 2020-08-15 DIAGNOSIS — Z79899 Other long term (current) drug therapy: Secondary | ICD-10-CM | POA: Insufficient documentation

## 2020-08-15 DIAGNOSIS — D649 Anemia, unspecified: Secondary | ICD-10-CM

## 2020-08-15 DIAGNOSIS — Z794 Long term (current) use of insulin: Secondary | ICD-10-CM | POA: Insufficient documentation

## 2020-08-15 DIAGNOSIS — Z7901 Long term (current) use of anticoagulants: Secondary | ICD-10-CM | POA: Insufficient documentation

## 2020-08-15 LAB — COMPREHENSIVE METABOLIC PANEL
ALT: 17 U/L (ref 0–44)
AST: 16 U/L (ref 15–41)
Albumin: 2.2 g/dL — ABNORMAL LOW (ref 3.5–5.0)
Alkaline Phosphatase: 132 U/L — ABNORMAL HIGH (ref 38–126)
Anion gap: 9 (ref 5–15)
BUN: 11 mg/dL (ref 8–23)
CO2: 29 mmol/L (ref 22–32)
Calcium: 8 mg/dL — ABNORMAL LOW (ref 8.9–10.3)
Chloride: 94 mmol/L — ABNORMAL LOW (ref 98–111)
Creatinine, Ser: 1.04 mg/dL (ref 0.61–1.24)
GFR, Estimated: >60 mL/min (ref >60)
Glucose, Bld: 480 mg/dL — ABNORMAL HIGH (ref 70–99)
Potassium: 2.7 mmol/L — CL (ref 3.5–5.1)
Sodium: 132 mmol/L — ABNORMAL LOW (ref 135–145)
Total Bilirubin: 0.6 mg/dL (ref 0.3–1.2)
Total Protein: 5.8 g/dL — ABNORMAL LOW (ref 6.5–8.1)

## 2020-08-15 LAB — CBC
HCT: 19.9 % — ABNORMAL LOW (ref 39.0–52.0)
Hemoglobin: 6.7 g/dL — CL (ref 13.0–17.0)
MCH: 29.8 pg (ref 26.0–34.0)
MCHC: 33.7 g/dL (ref 30.0–36.0)
MCV: 88.4 fL (ref 80.0–100.0)
Platelets: 440 10*3/uL — ABNORMAL HIGH (ref 150–400)
RBC: 2.25 MIL/uL — ABNORMAL LOW (ref 4.22–5.81)
RDW: 15.9 % — ABNORMAL HIGH (ref 11.5–15.5)
WBC: 8.3 10*3/uL (ref 4.0–10.5)
nRBC: 0 % (ref 0.0–0.2)

## 2020-08-15 LAB — PROTIME-INR
INR: 1.2 (ref 0.8–1.2)
Prothrombin Time: 15.4 seconds — ABNORMAL HIGH (ref 11.4–15.2)

## 2020-08-15 LAB — PREPARE RBC (CROSSMATCH)

## 2020-08-15 MED ORDER — POTASSIUM CHLORIDE 10 MEQ/100ML IV SOLN
10.0000 meq | INTRAVENOUS | Status: AC
  Administered 2020-08-15 (×2): 10 meq via INTRAVENOUS
  Filled 2020-08-15 (×2): qty 100

## 2020-08-15 MED ORDER — SODIUM CHLORIDE 0.9 % IV SOLN: 10.0000 mL/h | Freq: Once | INTRAVENOUS | Status: DC

## 2020-08-15 MED ORDER — POTASSIUM CHLORIDE CRYS ER 20 MEQ PO TBCR: 20.0000 meq | EXTENDED_RELEASE_TABLET | Freq: Every day | ORAL | 0 refills | Status: DC

## 2020-08-15 NOTE — Patient Outreach (Signed)
[facility]) Care Management  08/15/2020  Allen Yu 11-20-1945 [id number]   [facility] outreach for complex care patient   Allen Allen Yu was referred to [facility] on 07/01/20 by [facility] hospital liaison for post hospital/complex care services after his left AKA (above the knee amputation) and for disease management of diabetes   Insurance: tricare for life, Medicare   Last admissions 08/15/20 ED presented with concern for low hemoglobin (6.7 in ED) per PCP, also with foley catheter pain., In ED with critical low K+(2.7 in ED) 08/01/20 ED wanting foley removed, Pain at insertion site, foley removed, did not void, foley re inserted, referred to urology for 08/02/20 (missed 08/01/20 urology appointment) 07/21/20-07/26/20 Sepsis, AKI,cystitis Klebsiella bacteremia  discharged home with advance home care, [facility] palliative care Seen by Palliative NP, [name] during his hospitalization on 07/26/20 6/7-06/30/20 diabetes left AKA (above the knee amputation)- discharged with services from [facility] & [facility] for oxygen   Assessment RN CM received a voice message left and an e-mail by Allen Yu inquiring about scheduled 08/17/20 appointment.  RN CM outreached to Allen Yu and updated that Allen Yu was in the ED after being seen by Dr Willey Blade for symptoms for depression related to the loss of his left limb (left AKA (above the knee amputation) and his foley, Medicine was offered for depression and is reported to assist patient at night as he is reported to sleep better at times He continues to have concerns with his foley catheter and is reported per Allen Yu to want it removed. The catheter has been removed once in ED and once in his urology office but Allen Reuter was not able to void after each removal. RN CM discussed with Allen Yu the importance of keeping the foley patent and intact as compared to removal which will lead to bladder distension, urinary infections  and possible sepsis. She voiced understanding. Discussed possible request for use of lidocaine prilocaine cream   [facility] services Allen Bustillos confirms Allen Yu was seen at the [facility] by the [place] MD and SW [name]) on 08/03/20 He also had mental health evaluation and treatment Advance dementia noted  His VA services were transferred from [facility] to [facility] [place] He is being assisted with Ensure for protein calore malnutrition, home supplies ( depends underwear, gloves, syringes, under pads, insulin She continues to work with the [place] SW and presently has forms that need to be faxed back to [place] . 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   SIRS (systemic inflammatory response syndrome) ([place]) XX123456   Acute metabolic encephalopathy XX123456   Hyponatremia    S/P AKA (above knee amputation) unilateral, left ([place]) 06/28/2020   Gangrene of left foot ([place]) 06/28/2020   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 ([place]) 05/27/2020   Critical lower limb ischemia ([place]) 05/21/2020   Protein-calorie malnutrition, severe 05/19/2020   Plantar callus 12/24/2019   Cavitary pneumonia 09/14/2019   History of latent tuberculosis  09/14/2019   Unintentional weight loss 09/14/2019   Coronary artery calcification seen on CT scan 10/28/2018   Hilar adenopathy 04/10/2016   Lung nodule seen on imaging study 07/09/2015   Acute encephalopathy    Hypoglycemia  due to insulin    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[name]) 07/08/2015   Acute kidney injury superimposed on chronic kidney disease ([name]) 07/08/2015   Tobacco use disorder 07/08/2015   Diverticulosis of colon without hemorrhage    Left inguinal hernia 09/16/2013   Altered mental status 11/17/2012   Hypothermia 11/17/2012   Leukocytosis 11/17/2012   CKD (chronic kidney disease), stage II    PTSD (post-traumatic stress disorder)    GERD (gastroesophageal reflux disease)    Essential (primary) hypertension 09/10/2011   Anemia in chronic illness 09/10/2011   Diabetic neuropathy ([name]) 09/10/2011   DDD (degenerative disc disease), lumbar 09/10/2011   Psychosis ([name]) 09/10/2011   Diabetes mellitus type 2, uncontrolled ([place]) 09/10/2011   HLD (hyperlipidemia) 09/10/2011   Microalbuminuria 09/10/2011   Past Medical History:  Diagnosis Date   Anemia    Arthritis    Carotid stenosis    Chronic back pain    Chronic kidney disease    Constipation    Dementia (HCC)    Diabetes mellitus    GERD (gastroesophageal reflux disease)    History of kidney stones    Hypertension    Lung nodule    PAF (paroxysmal atrial fibrillation) (HCC)    Peripheral neuropathy    Peripheral vascular disease ([place])    PTSD (post-traumatic stress disorder)    Schizophrenia ([name])    Tuberculosis    Treated     Plans Agrees with plan of care  Follow up with pt/caregiver as agreed by wife within the next 30 business days .Pt encouraged to return a call to [facility] RN CM prn   Goals Addressed               This Visit's Progress     Patient Stated     [facility]) Eat Healthy (pt-stated)   On track     Timeframe:  Long-Range Goal Priority:  High Start Date:                     07/05/20        Expected End Date:           09/20/20            Follow Up Date 09/15/20  Barriers: Knowledge    - set a realistic goal - take in non milk based supplement -supplied by [place]     Notes:  08/15/20 Ensure  supplements to be provided by [facility] 08/01/20 pt eating poorly continues with episodes of diarrhea, social concerns, palliative care visit 08/11/20 07/06/20 diarrhea from milk base boost to try non milk base product      [facility]) Manage My Emotions (pt-stated)   On track     Timeframe:  Short-Term Goal Priority:  High Start Date:                        08/01/20     Expected End Date:       09/21/20                Follow Up Date 09/15/20   Barriers: Knowledge Psychosocial  - call and visit an old friend - join a support group - talk about feelings with a friend, family  or spiritual advisor - practice positive thinking and self-talk    Notes:  08/15/20 follow up with Allen [place], Pt in [facility] ED after pcp visit low hemoglobin (6.7 in ED), foley catheter pain, critical low K+(2.7 in ED) discharged home per pt request after blood transfusion- advance dementia noted 08/04/20 seen at [facility] on 08/03/20 08/01/20 money reported missing from account, returning home with friends soiled, informing wife wanting to spend all money before pass, starting to do less of ADLs Wife interested in possible VA or Engineer, water for coping      [facility]) Manage My Medicine (pt-stated)   On track     Timeframe:  Short-Term Goal Priority:  High Start Date:                         07/05/20    Expected End Date:              09/20/20         Follow Up Date 09/15/20   Barriers: Knowledge  - call for medicine refill 2 or 3 days before it runs out - keep a list of all the medicines I take; vitamins and herbals too -pick up insulin when able from local pharmacy and use mail order prn    Notes: 08/15/20 VA to assist with medications issued by [place], pcp to assist with medications offered by pcp.  08/01/20 medication received but patient reported to not be taking medications as ordered/social concerns. palliative care visit 08/11/20 07/06/20 wife will go pick up insulin from pharmacy  07/05/20 pending refill of insulin            Birtie Fellman L. [name], RN, BSN, [place] Coordinator Office number [phone] [facility] number [phone] Fax number [phone]

## 2020-08-15 NOTE — Discharge Instructions (Addendum)
Allen Yu was seen in the ER today for his anemia and was administered a blood transfusion.  He was also quite low in his potassium which was repleted through his IV.  He has been prescribed a few days of potassium to take at home to make sure his potassium stays normal.  While he does have chronic anemia, would recommend gastroenterology evaluation to identify source of bleeding should that align with his advanced directives.  Please follow-up with his primary care doctor and return to the ER if he develops any new severe symptoms such as shortness of breath, palpitations, nausea vomiting does not stop, worsening fatigue, or any other new severe symptoms.

## 2020-08-15 NOTE — ED Provider Notes (Signed)
Allen Yu   CSN: [id number] Arrival date & time: 08/15/20  1205     History No chief complaint on file.   Allen Yu is a 75 y.o. male who presents with concern for low hemoglobin per PCP.  Of Yu patient does have advanced dementia and is extremely agitated, angry, states he wants to leave, attempting to hit this provider.   His wife is at the bedside.  She states that he has had extensive medical issues this month with an AKA due to critical ischemia of the leg as well as bladder infection not dependent upon Foley catheter.  Patient does not appear to be following conversation, he is convinced that the reason he is here is for his Foley catheter, however states that the blood is not can help with Foley catheter so he does not want it.  I personally reviewed this patient's medical records.  He has history of insulin-dependent diabetes, atrial fibrillation atient is anticoagulated on Eliquis , on Pacerone.  AKA for PAD in the left leg, hyperlipidemia, anemia due to chronic illness.  Level 5 caveat due to patient's underlying dementia.  HPI     Past Medical History:  Diagnosis Date   Anemia    Arthritis    Carotid stenosis    Chronic back pain    Chronic kidney disease    Constipation    Dementia (HCC)    Diabetes mellitus    GERD (gastroesophageal reflux disease)    History of kidney stones    Hypertension    Lung nodule    PAF (paroxysmal atrial fibrillation) ([place])    Peripheral neuropathy    Peripheral vascular disease (HCC)    PTSD (post-traumatic stress disorder)    Schizophrenia ([place])    Tuberculosis    Treated    Patient Active Problem List   Diagnosis Date Noted   Problem related to unspecified psychosocial circumstances 08/01/2020   Hypermetropia 07/22/2020   AF (paroxysmal atrial fibrillation) ([place]) 07/22/2020   Klebsiella sepsis ([place]) 07/22/2020   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   SIRS (systemic inflammatory  response syndrome) ([place]) XX123456   Acute metabolic encephalopathy [id number]   Hyponatremia    S/P AKA (above knee amputation) unilateral, left ([place]) 06/28/2020   Gangrene of left foot ([place]) 06/28/2020   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 ([place]) 05/27/2020   Critical lower limb ischemia ([place]) 05/21/2020   Protein-calorie malnutrition, severe 05/19/2020   Plantar callus 12/24/2019   Cavitary pneumonia 09/14/2019   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   Unintentional weight loss 09/14/2019   Coronary artery calcification seen on CT scan 10/28/2018   Hilar adenopathy 04/10/2016   Lung nodule seen on imaging study 07/09/2015   Acute encephalopathy    Hypoglycemia due to insulin    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[place]) 07/08/2015   Acute kidney injury superimposed on chronic kidney disease ([place]) 07/08/2015   Tobacco use disorder 07/08/2015   Diverticulosis of colon without hemorrhage    Left inguinal hernia 09/16/2013   Altered mental status 11/17/2012   Hypothermia 11/17/2012   Leukocytosis 11/17/2012   CKD (chronic kidney disease), stage II    PTSD (post-traumatic stress disorder)    GERD (gastroesophageal reflux disease)    Essential (primary) hypertension 09/10/2011   Anemia in chronic illness 09/10/2011   Diabetic neuropathy ([place]) 09/10/2011   DDD (degenerative disc disease), lumbar 09/10/2011   Psychosis ([place]) 09/10/2011   Diabetes mellitus type 2, uncontrolled ([place]) 09/10/2011  HLD (hyperlipidemia) 09/10/2011   Microalbuminuria 09/10/2011    Past Surgical History:  Procedure Laterality Date   ABDOMINAL AORTOGRAM W/LOWER EXTREMITY N/A 05/20/2020   Procedure: ABDOMINAL AORTOGRAM W/LOWER EXTREMITY;  Surgeon: Elam Dutch, MD;  Location: [facility];  Service: Cardiovascular;  Laterality: N/A;   AMPUTATION Left 06/28/2020   Procedure: AMPUTATION ABOVE KNEE LEFT;  Surgeon: Rosetta Posner, MD;  Location: [facility] OR;  Service:  Vascular;  Laterality: Left;   [place], 2013   x2   BLADDER SURGERY     02   CHOLECYSTECTOMY     COLONOSCOPY     COLONOSCOPY N/A 12/07/2014   Procedure: COLONOSCOPY;  Surgeon: Daneil Dolin, MD;  [facility];  Service: Endoscopy;  Laterality: N/A;  10:30 Am   EYE SURGERY Bilateral    removed metal from eye   FEMORAL-TIBIAL BYPASS GRAFT Left 05/27/2020   Procedure: LEFT FEMORAL TO PERONEAL ARTERY BYPASS;  Surgeon: Rosetta Posner, MD;  Location: [facility];  Service: Vascular;  Laterality: Left;   HERNIA REPAIR Right    INGUINAL HERNIA REPAIR Left 11/03/2013   Procedure: HERNIA REPAIR INGUINAL ADULT;  Surgeon: Gayland Curry, MD;  Location: [facility];  Service: General;  Laterality: Left;   SHOULDER SURGERY     RIGHT SHOULDER    VIDEO BRONCHOSCOPY WITH ENDOBRONCHIAL NAVIGATION N/A 07/10/2019   Procedure: VIDEO BRONCHOSCOPY WITH ENDOBRONCHIAL NAVIGATION;  Surgeon: Melrose Nakayama, MD;  Location: [facility] OR;  Service: Thoracic;  Laterality: N/A;       Family History  Problem Relation Age of Onset   Heart disease Mother        before age 59    Social History   Tobacco Use   Smoking status: Every Day    Packs/day: 0.50    Types: Cigarettes   Smokeless tobacco: Never   Tobacco comments:    burns them up  Vaping Use   Vaping Use: Never used  Substance Use Topics   Alcohol use: No    Alcohol/week: 0.0 standard drinks   Drug use: Yes    Types: Marijuana    Comment: almost daily for pain    Home Medications Prior to Admission medications   Medication Sig Start Date End Date Taking? Authorizing Provider  acetaminophen (TYLENOL) 500 MG tablet Take 500 mg by mouth every 6 (six) hours as needed for moderate pain.   Yes [provider]  amiodarone (PACERONE) 200 MG tablet Take 1 tablet (200 mg total) by mouth daily. 07/26/20  Yes Tat, Shanon Brow, MD  apixaban (ELIQUIS) 2.5 MG TABS tablet Take 1 tablet (2.5 mg total) by mouth 2 (two) times daily.  07/26/20  Yes Tat, Shanon Brow, MD  atorvastatin (LIPITOR) 40 MG tablet Take 1 tablet (40 mg total) by mouth at bedtime. Patient taking differently: Take 40 mg by mouth daily. 05/21/20 08/19/20 Yes [place] Eric J, DO  donepezil (ARICEPT) 10 MG tablet Take 10 mg by mouth at bedtime.  06/28/16  Yes [provider]  gabapentin (NEURONTIN) 100 MG capsule Take 100 mg by mouth at bedtime. 11/06/19  Yes [provider]  insulin glargine-yfgn 100 UNIT/ML SOLN INJECT 10 UNITS SUBCUTANEOUSLY DAILY (DISCARD VIAL 28 DAYS AFTER OPENING) 08/03/20  Yes [provider]  LANTUS 100 UNIT/ML injection Inject 0.1 mLs (10 Units total) into the skin at bedtime as needed (High blood glucose). 06/30/20  Yes Mercy Riding, MD  metoprolol tartrate (LOPRESSOR) 25 MG tablet  Take 1 tablet (25 mg total) by mouth 2 (two) times daily. 06/30/20 06/30/21 Yes Mercy Riding, MD  mirtazapine (REMERON) 7.5 MG tablet Take 7.5 mg by mouth at bedtime. 08/12/20  Yes [provider]  tamsulosin (FLOMAX) 0.4 MG CAPS capsule Take 1 capsule (0.4 mg total) by mouth daily. 07/26/20  Yes Tat, Shanon Brow, MD  traMADol (ULTRAM) 50 MG tablet Take 50 mg by mouth every 6 (six) hours as needed for moderate pain. 07/18/20  Yes [provider]  amiodarone (PACERONE) 200 MG tablet Take 1 tablet by mouth daily. Patient not taking: No sig reported 08/03/20   [provider]  atorvastatin (LIPITOR) 40 MG tablet Take 1 tablet by mouth daily. Patient not taking: No sig reported 08/03/20   [provider]  B-D UF III MINI PEN NEEDLES 31G X 5 MM MISC SMARTSIG:1 Each SUB-Q Daily 12/23/19   [provider]  cefdinir (OMNICEF) 300 MG capsule Take 1 capsule (300 mg total) by mouth 2 (two) times daily. Patient not taking: No sig reported 07/26/20   Orson Eva, MD  ciprofloxacin (CIPRO) 500 MG tablet  07/26/20   [provider]  gabapentin (NEURONTIN) 100 MG capsule Take 1 capsule by mouth at bedtime. Patient not  taking: No sig reported 08/03/20   [provider]  HYDROcodone-acetaminophen (NORCO/VICODIN) 5-325 MG tablet Take 1 tablet by mouth at bedtime as needed for severe pain or moderate pain. Patient not taking: No sig reported 08/02/20   Evalee Jefferson, PA-C  Multiple Vitamins-Minerals (MULTIVITAMIN WITH MINERALS) tablet Take 1 tablet by mouth daily. 50 plus Patient not taking: Reported on 08/15/2020    [provider]  nicotine (NICODERM CQ - DOSED IN MG/24 HOURS) 21 mg/24hr patch Place 1 patch (21 mg total) onto the skin daily as needed (Cessation of smoking). Patient not taking: Reported on 08/15/2020 06/30/20   Mercy Riding, MD  oxyCODONE-acetaminophen (PERCOCET) 5-325 MG tablet Take 1 tablet by mouth every 6 (six) hours as needed for severe pain. Do not take with Acetaminophen (Tylenol) Patient not taking: Reported on 08/15/2020 07/01/20 07/01/21  Karoline Caldwell, PA-C    Allergies    Bee venom, Codeine, Propoxyphene, and Valsartan  Review of Systems   Review of Systems  Unable to perform ROS: Dementia   Physical Exam Updated Vital Signs BP 140/71   Pulse 80   Temp 98.2 F (36.8 C) (Oral)   Resp 20   SpO2 100%   Physical Exam Vitals and nursing Yu reviewed.  Constitutional:      Appearance: He is not toxic-appearing.  HENT:     Head: Normocephalic and atraumatic.     Nose: Nose normal.     Mouth/Throat:     Mouth: Mucous membranes are moist.     Pharynx: Oropharynx is clear. Uvula midline. No oropharyngeal exudate or posterior oropharyngeal erythema.     Tonsils: No tonsillar exudate.  Eyes:     General: Lids are normal. Vision grossly intact.        Right eye: No discharge.        Left eye: No discharge.     Extraocular Movements: Extraocular movements intact.     Conjunctiva/sclera: Conjunctivae normal.     Pupils: Pupils are equal, round, and reactive to light.  Neck:     Trachea: Trachea and phonation normal.  Cardiovascular:     Rate and Rhythm: Normal  rate and regular rhythm.     Pulses:  Left popliteal pulse not accessible.       Dorsalis pedis pulses are 1+ on the right side. Left dorsalis pedis pulse not accessible.     Heart sounds: Normal heart sounds. No murmur heard. Pulmonary:     Effort: Pulmonary effort is normal. No tachypnea, bradypnea, accessory muscle usage, prolonged expiration or respiratory distress.     Breath sounds: Normal breath sounds. No wheezing or rales.  Chest:     Chest wall: No mass, lacerations, deformity, swelling, tenderness, crepitus or edema.  Abdominal:     General: Bowel sounds are normal. There is no distension.     Palpations: Abdomen is soft.     Tenderness: There is no abdominal tenderness. There is no right CVA tenderness, left CVA tenderness, guarding or rebound.  Genitourinary:    Comments: Foley catheter bag in place. Musculoskeletal:        General: No deformity.     Cervical back: Normal range of motion and neck supple. No edema, rigidity or crepitus. No pain with movement, spinous process tenderness or muscular tenderness.     Right lower leg: No edema.  Lymphadenopathy:     Cervical: No cervical adenopathy.  Skin:    General: Skin is warm and dry.     Capillary Refill: Capillary refill takes less than 2 seconds.  Neurological:     Mental Status: He is alert. Mental status is at baseline.  Psychiatric:        Mood and Affect: Mood normal.    ED Results / Procedures / Treatments   Labs (all labs ordered are listed, but only abnormal results are displayed) Labs Reviewed  COMPREHENSIVE METABOLIC PANEL - Abnormal; Notable for the following components:      Result Value   Sodium 132 (*)    Potassium 2.7 (*)    Chloride 94 (*)    Glucose, Bld 480 (*)    Calcium 8.0 (*)    Total Protein 5.8 (*)    Albumin 2.2 (*)    Alkaline Phosphatase 132 (*)    All other components within normal limits  CBC - Abnormal; Notable for the following components:   RBC 2.25 (*)     Hemoglobin 6.7 (*)    HCT 19.9 (*)    RDW 15.9 (*)    Platelets 440 (*)    All other components within normal limits  PROTIME-INR - Abnormal; Notable for the following components:   Prothrombin Time 15.4 (*)    All other components within normal limits  TYPE AND SCREEN  PREPARE RBC (CROSSMATCH)    EKG EKG: normal sinus rhythm, PAC's noted.   Radiology No results found.  Procedures .Critical Care  Date/Time: 08/15/2020 6:23 PM Performed by: Emeline Darling, PA-C Authorized by: Emeline Darling, PA-C   Critical care provider statement:    Critical care time (minutes):  45   Critical care was time spent personally by me on the following activities:  Discussions with consultants, evaluation of patient's response to treatment, examination of patient, ordering and performing treatments and interventions, ordering and review of laboratory studies, ordering and review of radiographic studies, pulse oximetry, re-evaluation of patient's condition, obtaining history from patient or surrogate and review of old charts   Medications Ordered in ED Medications  0.9 %  sodium chloride infusion (has no administration in time range)  potassium chloride 10 mEq in 100 mL IVPB (10 mEq Intravenous New Bag/Given 08/15/20 1653)    ED Course  I have reviewed the triage  vital signs and the nursing notes.  Pertinent labs & imaging results that were available during my care of the patient were reviewed by me and considered in my medical decision making (see chart for details).    MDM Rules/Calculators/A&P                         75 year old male with dementia who presents with severe anemia per PCP outpatient laboratory studies.  Differential diagnosis of this patient symptoms includes vomited to symptomatic anemia secondary to GI hemorrhage, blood loss from recent AKA, however less likely given this was a month ago, iron deficiency anemia, marrow failure.  Mildly bradycardic on intake  with heart rate of 56, but is otherwise normal.  Cardiopulmonary exam is normal, abdominal exam is benign.  Patient does have Foley catheter in place.  Physical exam complicated by patient's aggression towards this provider given his underlying dementia.  CBC with severe anemia of 6.7; patient's baseline for anemia is closer to 8. Has been diagnosed with anemia of chronic disease. CMP with critical hypokalemia of 2.7 and hyponatremia of 132.  Per his wife at the bedside anemia has not been worked up.  There is been no GI evaluation.  Did discuss with her the role of rectal exam, however patient himself is refused any further physical exam.  Given his aggression, will hold off on further discussion of rectal exam at this time.  He is agreeable after extensive discussion for blood transfusion, however do not feel he has the mental capacity to understand the risks versus benefits.  These were discussed at length with his wife at the bedside to did give consent for him to proceed with blood transfusion.  We will proceed with 1 unit of PRBCs as well as IV potassium repletion given severe hypokalemia of 2.7.  Patient reevaluated after administration of blood transfusion and IV potassium. VS have remained normal throughout his stay. Once again becoming agitated and states he is adamant he wants to be discharged home.  The patient does have underlying dementia, he remains on power of attorney and continues to express that he knows he is "dying anyways" and that he does not want to be hospitalized. Given hx of chronic anemia and reassuring VS, feel discharge home is reasonable.   The patient and his wife voiced understanding with medical evaluation and treatment plan.  Their questions was answered to their expressed satisfaction.  It was once again reiterated that it is important that the source of his anemia be worked up to further extent, though he did refuse this work-up here in the emergency department today.   His wife voiced understanding of this recommendation and continue to express wishes to be discharged home.  Patient is stable and appropriate for discharge home at this time.   This chart was dictated using voice recognition software, Dragon. Despite the best efforts of this provider to proofread and correct errors, errors may still occur which can change documentation meaning.  Final Clinical Impression(s) / ED Diagnoses Final diagnoses:  None    Rx / DC Orders ED Discharge Orders     None        Aura Dials 08/15/20 1827    Hayden Rasmussen, MD 08/16/20 [phone]

## 2020-08-15 NOTE — ED Triage Notes (Signed)
Referred from PCP due to low bp

## 2020-08-16 LAB — BPAM RBC
Blood Product Expiration Date: 202208302359
ISSUE DATE / TIME: 202207251545
Unit Type and Rh: 5100

## 2020-08-16 LAB — TYPE AND SCREEN
ABO/RH(D): O POS
Antibody Screen: NEGATIVE
Unit division: 0

## 2020-08-17 ENCOUNTER — Ambulatory Visit: Payer: Self-pay | Admitting: *Deleted

## 2020-08-23 DIAGNOSIS — R338 Other retention of urine: Secondary | ICD-10-CM | POA: Diagnosis not present

## 2020-08-23 DIAGNOSIS — N3080 Other cystitis without hematuria: Secondary | ICD-10-CM | POA: Diagnosis not present

## 2020-08-26 ENCOUNTER — Ambulatory Visit (INDEPENDENT_AMBULATORY_CARE_PROVIDER_SITE_OTHER): Payer: Medicare Other | Admitting: Student

## 2020-08-26 ENCOUNTER — Other Ambulatory Visit: Payer: Self-pay

## 2020-08-26 ENCOUNTER — Encounter: Payer: Self-pay | Admitting: Student

## 2020-08-26 VITALS — BP 142/68 | HR 72 | Ht 73.0 in

## 2020-08-26 DIAGNOSIS — I480 Paroxysmal atrial fibrillation: Secondary | ICD-10-CM

## 2020-08-26 DIAGNOSIS — I70229 Atherosclerosis of native arteries of extremities with rest pain, unspecified extremity: Secondary | ICD-10-CM | POA: Diagnosis not present

## 2020-08-26 DIAGNOSIS — N182 Chronic kidney disease, stage 2 (mild): Secondary | ICD-10-CM

## 2020-08-26 DIAGNOSIS — D649 Anemia, unspecified: Secondary | ICD-10-CM

## 2020-08-26 NOTE — Progress Notes (Signed)
Cardiology Office Note    Date:  08/27/2020   ID:  Allen Yu, DOB 18-Dec-1945, MRN [id number]  PCP:  [name], MD  Cardiologist: Rozann Lesches, MD    Chief Complaint  Patient presents with   Hospitalization Follow-up    History of Present Illness:    Allen Yu is a 75 y.o. male with past medical history of PAD (s/p L AKA in 06/2020), HTN, Type 2 DM, PTSD, Schizophrenia and dementia who presents to the office today for hospital follow-up.  He was admitted to [facility] on 07/21/2020 for acute metabolic encephalopathy in the setting of Klebsiella Urosepsis. Cardiology was consulted during admission for atrial fibrillation with RVR. He intermittently refused PO medications and was started on IV Amiodarone for rate control and converted to NSR with this. This was transitioned to PO Amiodarone 200 mg daily at discharge and he was continued on Lopressor 25 mg twice daily. It was recommended to plan for a short course of PO Amiodarone with likely discontinuation in the outpatient setting if no known recurrence. From a cardiology perspective, he was not started on anticoagulation given his multiple medical issues including anemia and hematuria during admission. He was started on Eliquis 2.5 mg twice daily by the Hospitalist at discharge (reduced dosing given weight and creatinine of 1.57 at discharge).   He did present to the ED on [id number] for complications regarding his Foley catheter and this was removed with resolution of his pain. He presented back to the ED on 08/15/2020 for evaluation of worsening anemia per his PCP and was found to be anemic with hemoglobin at 6.7. He was given 1 unit PRBC's. Admission was recommended for further work-up of his anemia but the patient and his wife declined.  In talking with the patient and his wife today, most history is provided by his wife secondary to his dementia and also being hard of hearing. His main complaint today is that he still has his  urinary catheter in place and she reports he has failed multiple voiding trials and has scheduled follow-up with Urology again in several weeks. He feels weak and says that his appetite has decreased as nothing has any flavor. He denies any specific chest pain or palpitations. No recent dyspnea on exertion, orthopnea or PND. No reported melena, hematochezia or hematuria.  Past Medical History:  Diagnosis Date   Anemia    Arthritis    Carotid stenosis    Chronic back pain    Chronic kidney disease    Constipation    Dementia (HCC)    Diabetes mellitus    GERD (gastroesophageal reflux disease)    History of kidney stones    Hypertension    Lung nodule    PAF (paroxysmal atrial fibrillation) (HCC)    Peripheral neuropathy    Peripheral vascular disease (HCC)    PTSD (post-traumatic stress disorder)    Schizophrenia ([place])    Tuberculosis    Treated    Past Surgical History:  Procedure Laterality Date   ABDOMINAL AORTOGRAM W/LOWER EXTREMITY N/A 05/20/2020   Procedure: ABDOMINAL AORTOGRAM W/LOWER EXTREMITY;  Surgeon: Elam Dutch, MD;  Location: [facility];  Service: Cardiovascular;  Laterality: N/A;   AMPUTATION Left 06/28/2020   Procedure: AMPUTATION ABOVE KNEE LEFT;  Surgeon: Rosetta Posner, MD;  Location: [facility] OR;  Service: Vascular;  Laterality: Left;   [place]  2000, 2013   x2   BLADDER SURGERY  02   CHOLECYSTECTOMY     COLONOSCOPY     COLONOSCOPY N/A 12/07/2014   Procedure: COLONOSCOPY;  Surgeon: Daneil Dolin, MD;  Location: [facility];  Service: Endoscopy;  Laterality: N/A;  10:30 Am   EYE SURGERY Bilateral    removed metal from eye   FEMORAL-TIBIAL BYPASS GRAFT Left 05/27/2020   Procedure: LEFT FEMORAL TO PERONEAL ARTERY BYPASS;  Surgeon: Rosetta Posner, MD;  Location: [facility];  Service: Vascular;  Laterality: Left;   HERNIA REPAIR Right    INGUINAL HERNIA REPAIR Left 11/03/2013   Procedure: HERNIA REPAIR INGUINAL ADULT;  Surgeon: Gayland Curry, MD;  Location: [facility];  Service: General;  Laterality: Left;   SHOULDER SURGERY     RIGHT SHOULDER    VIDEO BRONCHOSCOPY WITH ENDOBRONCHIAL NAVIGATION N/A 07/10/2019   Procedure: VIDEO BRONCHOSCOPY WITH ENDOBRONCHIAL NAVIGATION;  Surgeon: Melrose Nakayama, MD;  Location: [facility] OR;  Service: Thoracic;  Laterality: N/A;    Current Medications: Outpatient Medications Prior to Visit  Medication Sig Dispense Refill   acetaminophen (TYLENOL) 500 MG tablet Take 500 mg by mouth every 6 (six) hours as needed for moderate pain.     amiodarone (PACERONE) 200 MG tablet Take 1 tablet (200 mg total) by mouth daily. 30 tablet 1   atorvastatin (LIPITOR) 40 MG tablet Take 1 tablet (40 mg total) by mouth at bedtime. (Patient taking differently: Take 40 mg by mouth daily.) 90 tablet 0   B-D UF III MINI PEN NEEDLES 31G X 5 MM MISC SMARTSIG:1 Each SUB-Q Daily     donepezil (ARICEPT) 10 MG tablet Take 10 mg by mouth at bedtime.      finasteride (PROSCAR) 5 MG tablet Take 5 mg by mouth daily.     gabapentin (NEURONTIN) 100 MG capsule Take 100 mg by mouth at bedtime.     insulin glargine-yfgn 100 UNIT/ML SOLN INJECT 10 UNITS SUBCUTANEOUSLY DAILY (DISCARD VIAL 28 DAYS AFTER OPENING)     LANTUS 100 UNIT/ML injection Inject 0.1 mLs (10 Units total) into the skin at bedtime as needed (High blood glucose). (Patient taking differently: Inject 20 Units into the skin at bedtime as needed (High blood glucose).) 10 mL 11   metoprolol tartrate (LOPRESSOR) 25 MG tablet Take 1 tablet (25 mg total) by mouth 2 (two) times daily. 180 tablet 3   mirtazapine (REMERON) 7.5 MG tablet Take 7.5 mg by mouth at bedtime.     potassium chloride SA (KLOR-CON) 20 MEQ tablet Take 1 tablet (20 mEq total) by mouth daily for 3 doses. 3 tablet 0   tamsulosin (FLOMAX) 0.4 MG CAPS capsule Take 1 capsule (0.4 mg total) by mouth daily. 30 capsule 1   traMADol (ULTRAM) 50 MG tablet Take 50 mg by mouth every 6 (six) hours as needed for moderate  pain.     apixaban (ELIQUIS) 2.5 MG TABS tablet Take 1 tablet (2.5 mg total) by mouth 2 (two) times daily. 60 tablet 1   amiodarone (PACERONE) 200 MG tablet Take 1 tablet by mouth daily. (Patient not taking: No sig reported)     cefdinir (OMNICEF) 300 MG capsule Take 1 capsule (300 mg total) by mouth 2 (two) times daily. (Patient not taking: No sig reported) 20 capsule 0   ciprofloxacin (CIPRO) 500 MG tablet  (Patient not taking: No sig reported)     gabapentin (NEURONTIN) 100 MG capsule Take 1 capsule by mouth at bedtime. (Patient not taking: No sig reported)     HYDROcodone-acetaminophen (NORCO/VICODIN)  5-325 MG tablet Take 1 tablet by mouth at bedtime as needed for severe pain or moderate pain. (Patient not taking: No sig reported) 6 tablet 0   Multiple Vitamins-Minerals (MULTIVITAMIN WITH MINERALS) tablet Take 1 tablet by mouth daily. 50 plus (Patient not taking: No sig reported)     nicotine (NICODERM CQ - DOSED IN MG/24 HOURS) 21 mg/24hr patch Place 1 patch (21 mg total) onto the skin daily as needed (Cessation of smoking). (Patient not taking: No sig reported) 28 patch 0   oxyCODONE-acetaminophen (PERCOCET) 5-325 MG tablet Take 1 tablet by mouth every 6 (six) hours as needed for severe pain. Do not take with Acetaminophen (Tylenol) (Patient not taking: No sig reported) 30 tablet 0   atorvastatin (LIPITOR) 40 MG tablet Take 1 tablet by mouth daily. (Patient not taking: No sig reported)     No facility-administered medications prior to visit.     Allergies:   Bee venom, Codeine, Propoxyphene, and Valsartan   Social History   Socioeconomic History   Marital status: Married    Spouse name: [name]   Number of children: Not on file   Years of education: Not on file   Highest education level: Not on file  Occupational History   Not on file  Tobacco Use   Smoking status: Every Day    Packs/day: 0.50    Types: Cigarettes   Smokeless tobacco: Never   Tobacco comments:    burns them up   Vaping Use   Vaping Use: Never used  Substance and Sexual Activity   Alcohol use: No    Alcohol/week: 0.0 standard drinks   Drug use: Yes    Types: Marijuana    Comment: almost daily for pain   Sexual activity: Never  Other Topics Concern   Not on file  Social History Narrative   Not on file   Social Determinants of Health   Financial Resource Strain: Not on file  Food Insecurity: No Food Insecurity   Worried About Running Out of Food in the Last Year: Never true   Ran Out of Food in the Last Year: Never true  Transportation Needs: No Transportation Needs   Lack of Transportation (Medical): No   Lack of Transportation (Non-Medical): No  Physical Activity: Not on file  Stress: No Stress Concern Present   Feeling of Stress : Only a little  Social Connections: Not on file     Family History:  The patient's family history includes Heart disease in his mother.   Review of Systems:    Please see the history of present illness.     All other systems reviewed and are otherwise negative except as noted above.   Physical Exam:    VS:  BP (!) 142/68   Pulse 72   Ht '6\' 1"'$  (1.854 m)   BMI 12.94 kg/m    General: Thin elderly male appearing in no acute distress. Head: Normocephalic, atraumatic. Neck: No carotid bruits. JVD not elevated.  Lungs: Respirations regular and unlabored, without wheezes or rales.  Heart: Regular rate and rhythm. No S3 or S4.  No murmur, no rubs, or gallops appreciated. Abdomen: Appears non-distended. No obvious abdominal masses. Msk:  Strength and tone appear normal for age. No obvious joint deformities or effusions. Extremities: No clubbing or cyanosis. No pitting edema. Left AKA. Neuro: Alert and oriented X 2 (person and place). Moves all extremities spontaneously. No focal deficits noted. Psych:  Responds to questions appropriately with a normal affect. Hard of hearing.  Skin: No rashes or lesions noted  Wt Readings from Last 3 Encounters:   08/02/20 98 lb 1.7 oz (44.5 kg)  07/23/20 98 lb 1.7 oz (44.5 kg)  06/28/20 127 lb 3.3 oz (57.7 kg)     Studies/Labs Reviewed:   EKG:  EKG is not ordered today. EKG from 08/15/2020 is reviewed and demonstrates NSR, HR 81 with no acute ST changes.   Recent Labs: 07/21/2020: TSH 0.351; TSH 0.359 07/26/2020: Magnesium 1.8 08/15/2020: ALT 17; BUN 11; Creatinine, Ser 1.04; Hemoglobin 6.7; Platelets 440; Potassium 2.7; Sodium 132   Lipid Panel    Component Value Date/Time   CHOL 114 05/28/2020 0028   TRIG 49 05/28/2020 0028   HDL 41 05/28/2020 0028   CHOLHDL 2.8 05/28/2020 0028   VLDL 10 05/28/2020 0028   LDLCALC 63 05/28/2020 0028    Additional studies/ records that were reviewed today include:   Echocardiogram: 06/29/2020 IMPRESSIONS     1. Left ventricular ejection fraction, by estimation, is 60 to 65%. The  left ventricle has normal function. The left ventricle has no regional  wall motion abnormalities. Left ventricular diastolic parameters are  indeterminate.   2. Right ventricular systolic function is normal. The right ventricular  size is normal. There is normal pulmonary artery systolic pressure. The  estimated right ventricular systolic pressure is 123456 mmHg.   3. Left atrial size was mildly dilated.   4. Right atrial size was mildly dilated.   5. The mitral valve is normal in structure. Mild mitral valve  regurgitation. No evidence of mitral stenosis.   6. The aortic valve is normal in structure. Aortic valve regurgitation is  not visualized. Mild to moderate aortic valve sclerosis/calcification is  present, without any evidence of aortic stenosis.   7. The inferior vena cava is normal in size with greater than 50%  respiratory variability, suggesting right atrial pressure of 3 mmHg.   Assessment:    1. AF (paroxysmal atrial fibrillation) ([place])   2. Anemia, unspecified type   3. Critical lower limb ischemia ([place])   4. CKD (chronic kidney disease), stage II       Plan:   In order of problems listed above:  1. Paroxysmal Atrial Fibrillation - This was a new diagnosis for the patient during his admission for Urosepsis and he converted back to NSR on IV Amiodarone. He denies any recent palpitations and was in NSR by recent EKG and is in NSR by examination today. By review of notes, a short-course of Amiodarone was recommended therefore I recommended that stop Amiodarone after he finishes his current bottle. Will continue Lopressor '25mg'$  BID.  - Discussed with the patient and his wife the risks and benefits of anticoagulation and given his worsening anemia (Hgb at 6.7 last week) and with him having declined further work-up, a mutual decision was made to stop Eliquis at this time. We did discuss the increased thromboembolic risk if he had recurrent atrial fibrillation.   2. Anemia - His Hgb was at 6.7 on 08/15/2020 and he declined admission for further work-up at that time. He denies any evidence of active bleeding. He did have repeat labs by his PCP in the interim and I will request a copy of these. Will stop Eliquis as outlined above.   3. PAD - He is s/p L AKA in 06/2020. Followed by Vascular Surgery and he remains on statin therapy.   4. Stage 2 CKD - His creatinine peaked at 2.54 during his recent admission, improved to 1.04  by recent labs on 08/15/2020. K+ was low at 2.7 on 7/25 and this was replaced and he had repeat labs with his PCP in the interim.   Medication Adjustments/Labs and Tests Ordered: Current medicines are reviewed at length with the patient today.  Concerns regarding medicines are outlined above.  Medication changes, Labs and Tests ordered today are listed in the Patient Instructions below. Patient Instructions  Medication Instructions:   STOP ELIQUIS NOW.   STOP AMIODARONE AFTER YOU FINISH YOUR CURRENT BOTTLE.  Labwork:  None  Testing/Procedures:  None  Follow-Up:  With Dr. Domenic Polite or Bernerd Pho, PA-C in 3  months  Any Other Special Instructions Will Be Listed Below (If Applicable).     If you need a refill on your cardiac medications before your next appointment, please call your pharmacy.   Signed, Erma Heritage, PA-C  08/27/2020 7:37 AM    [facility] [address] [place], [place] [address] Phone: [phone] Fax: [phone]

## 2020-08-26 NOTE — Patient Instructions (Signed)
Medication Instructions:   STOP ELIQUIS NOW.   STOP AMIODARONE AFTER YOU FINISH YOUR CURRENT BOTTLE.  Labwork:  None  Testing/Procedures:  None  Follow-Up:  With Dr. Domenic Polite or Bernerd Pho, PA-C in 3 months  Any Other Special Instructions Will Be Listed Below (If Applicable).     If you need a refill on your cardiac medications before your next appointment, please call your pharmacy.

## 2020-09-01 DIAGNOSIS — F209 Schizophrenia, unspecified: Secondary | ICD-10-CM | POA: Diagnosis not present

## 2020-09-01 DIAGNOSIS — D638 Anemia in other chronic diseases classified elsewhere: Secondary | ICD-10-CM | POA: Diagnosis not present

## 2020-09-01 DIAGNOSIS — I10 Essential (primary) hypertension: Secondary | ICD-10-CM | POA: Diagnosis not present

## 2020-09-01 DIAGNOSIS — E1165 Type 2 diabetes mellitus with hyperglycemia: Secondary | ICD-10-CM | POA: Diagnosis not present

## 2020-09-01 DIAGNOSIS — Z79899 Other long term (current) drug therapy: Secondary | ICD-10-CM | POA: Diagnosis not present

## 2020-09-05 DIAGNOSIS — E875 Hyperkalemia: Secondary | ICD-10-CM | POA: Diagnosis not present

## 2020-09-05 DIAGNOSIS — D630 Anemia in neoplastic disease: Secondary | ICD-10-CM | POA: Diagnosis not present

## 2020-09-05 DIAGNOSIS — E440 Moderate protein-calorie malnutrition: Secondary | ICD-10-CM | POA: Diagnosis not present

## 2020-09-12 DIAGNOSIS — N3000 Acute cystitis without hematuria: Secondary | ICD-10-CM | POA: Diagnosis not present

## 2020-09-12 DIAGNOSIS — R338 Other retention of urine: Secondary | ICD-10-CM | POA: Diagnosis not present

## 2020-09-15 ENCOUNTER — Ambulatory Visit: Payer: Self-pay | Admitting: *Deleted

## 2020-09-21 ENCOUNTER — Ambulatory Visit (INDEPENDENT_AMBULATORY_CARE_PROVIDER_SITE_OTHER): Payer: Medicare Other | Admitting: Physician Assistant

## 2020-09-21 ENCOUNTER — Ambulatory Visit (INDEPENDENT_AMBULATORY_CARE_PROVIDER_SITE_OTHER)
Admission: RE | Admit: 2020-09-21 | Discharge: 2020-09-21 | Disposition: A | Discharge status: Home / Self Care | Payer: Medicare Other | Source: Ambulatory Visit | Attending: Physician Assistant | Admitting: Physician Assistant

## 2020-09-21 ENCOUNTER — Other Ambulatory Visit: Payer: Self-pay

## 2020-09-21 VITALS — BP 152/80 | HR 85 | Temp 97.9°F | Resp 20 | Ht 73.0 in

## 2020-09-21 DIAGNOSIS — I5021 Acute systolic (congestive) heart failure: Secondary | ICD-10-CM | POA: Diagnosis not present

## 2020-09-21 DIAGNOSIS — I739 Peripheral vascular disease, unspecified: Secondary | ICD-10-CM | POA: Insufficient documentation

## 2020-09-21 DIAGNOSIS — G9341 Metabolic encephalopathy: Secondary | ICD-10-CM | POA: Diagnosis not present

## 2020-09-21 DIAGNOSIS — M79671 Pain in right foot: Secondary | ICD-10-CM | POA: Diagnosis not present

## 2020-09-21 DIAGNOSIS — Z20822 Contact with and (suspected) exposure to covid-19: Secondary | ICD-10-CM | POA: Diagnosis not present

## 2020-09-21 DIAGNOSIS — I70229 Atherosclerosis of native arteries of extremities with rest pain, unspecified extremity: Secondary | ICD-10-CM | POA: Diagnosis not present

## 2020-09-21 DIAGNOSIS — Z66 Do not resuscitate: Secondary | ICD-10-CM | POA: Diagnosis not present

## 2020-09-21 DIAGNOSIS — A419 Sepsis, unspecified organism: Secondary | ICD-10-CM | POA: Diagnosis not present

## 2020-09-21 DIAGNOSIS — J150 Pneumonia due to Klebsiella pneumoniae: Secondary | ICD-10-CM | POA: Diagnosis not present

## 2020-09-21 DIAGNOSIS — R918 Other nonspecific abnormal finding of lung field: Secondary | ICD-10-CM | POA: Diagnosis not present

## 2020-09-21 NOTE — Progress Notes (Signed)
Office Note     CC:  follow up Requesting Provider:  [name], MD  HPI: Allen Yu is a 75 y.o. (Apr 28, 1945) male who presents for evaluation of new wounds of right lower extremity.  He is well-known to the vascular surgery practice having had a left femoral to peroneal bypass by Dr. Donnetta Hutching on 05/27/2020 for limb salvage.  Unfortunately due to extensive tissue loss he required a left above-the-knee amputation by Dr. Donnetta Hutching on 06/28/2020.  His arteriogram from April of this year demonstrated diffusely diseased SFA with TP trunk occlusion and peroneal single-vessel runoff of the right lower extremity.  He is in a nursing facility and is nonambulatory.  He and his wife are not interested in extensive reconstructive surgery however would be willing to proceed with endovascular treatment.  He is on a statin daily.   Past Medical History:  Diagnosis Date   Anemia    Arthritis    Carotid stenosis    Chronic back pain    Chronic kidney disease    Constipation    Dementia (HCC)    Diabetes mellitus    GERD (gastroesophageal reflux disease)    History of kidney stones    Hypertension    Lung nodule    PAF (paroxysmal atrial fibrillation) (HCC)    Peripheral neuropathy    Peripheral vascular disease (HCC)    PTSD (post-traumatic stress disorder)    Schizophrenia ([place])    Tuberculosis    Treated    Past Surgical History:  Procedure Laterality Date   ABDOMINAL AORTOGRAM W/LOWER EXTREMITY N/A 05/20/2020   Procedure: ABDOMINAL AORTOGRAM W/LOWER EXTREMITY;  Surgeon: Elam Dutch, MD;  Location: [facility];  Service: Cardiovascular;  Laterality: N/A;   AMPUTATION Left 06/28/2020   Procedure: AMPUTATION ABOVE KNEE LEFT;  Surgeon: Rosetta Posner, MD;  Location: [facility] OR;  Service: Vascular;  Laterality: Left;   [place], 2013   x2   BLADDER SURGERY     02   CHOLECYSTECTOMY     COLONOSCOPY     COLONOSCOPY N/A 12/07/2014   Procedure: COLONOSCOPY;  Surgeon:  Daneil Dolin, MD;  Location: [facility];  Service: Endoscopy;  Laterality: N/A;  10:30 Am   EYE SURGERY Bilateral    removed metal from eye   FEMORAL-TIBIAL BYPASS GRAFT Left 05/27/2020   Procedure: LEFT FEMORAL TO PERONEAL ARTERY BYPASS;  Surgeon: Rosetta Posner, MD;  Location: [facility];  Service: Vascular;  Laterality: Left;   HERNIA REPAIR Right    INGUINAL HERNIA REPAIR Left 11/03/2013   Procedure: HERNIA REPAIR INGUINAL ADULT;  Surgeon: Gayland Curry, MD;  Location: [place];  Service: General;  Laterality: Left;   SHOULDER SURGERY     RIGHT SHOULDER    VIDEO BRONCHOSCOPY WITH ENDOBRONCHIAL NAVIGATION N/A 07/10/2019   Procedure: VIDEO BRONCHOSCOPY WITH ENDOBRONCHIAL NAVIGATION;  Surgeon: Melrose Nakayama, MD;  Location: [facility];  Service: Thoracic;  Laterality: N/A;    Social History   Socioeconomic History   Marital status: Married    Spouse name: [name]   Number of children: Not on file   Years of education: Not on file   Highest education level: Not on file  Occupational History   Not on file  Tobacco Use   Smoking status: Every Day    Packs/day: 0.50    Types: Cigarettes   Smokeless tobacco: Never   Tobacco comments:    burns them up  Vaping  Use   Vaping Use: Never used  Substance and Sexual Activity   Alcohol use: No    Alcohol/week: 0.0 standard drinks   Drug use: Yes    Types: Marijuana    Comment: almost daily for pain   Sexual activity: Never  Other Topics Concern   Not on file  Social History Narrative   Not on file   Social Determinants of Health   Financial Resource Strain: Not on file  Food Insecurity: No Food Insecurity   Worried About Running Out of Food in the Last Year: Never true   Ran Out of Food in the Last Year: Never true  Transportation Needs: No Transportation Needs   Lack of Transportation (Medical): No   Lack of Transportation (Non-Medical): No  Physical Activity: Not on file  Stress: No Stress Concern Present   Feeling of Stress :  Only a little  Social Connections: Not on file  Intimate Partner Violence: Not At Risk   Fear of Current or Ex-Partner: No   Emotionally Abused: No   Physically Abused: No   Sexually Abused: No    Family History  Problem Relation Age of Onset   Heart disease Mother        before age 5    Current Outpatient Medications  Medication Sig Dispense Refill   acetaminophen (TYLENOL) 500 MG tablet Take 500 mg by mouth every 6 (six) hours as needed for moderate pain.     amiodarone (PACERONE) 200 MG tablet Take 1 tablet (200 mg total) by mouth daily. 30 tablet 1   amiodarone (PACERONE) 200 MG tablet Take 1 tablet by mouth daily.     B-D UF III MINI PEN NEEDLES 31G X 5 MM MISC SMARTSIG:1 Each SUB-Q Daily     cefdinir (OMNICEF) 300 MG capsule Take 1 capsule (300 mg total) by mouth 2 (two) times daily. 20 capsule 0   ciprofloxacin (CIPRO) 500 MG tablet      donepezil (ARICEPT) 10 MG tablet Take 10 mg by mouth at bedtime.      finasteride (PROSCAR) 5 MG tablet Take 5 mg by mouth daily.     gabapentin (NEURONTIN) 100 MG capsule Take 100 mg by mouth at bedtime.     gabapentin (NEURONTIN) 100 MG capsule Take 1 capsule by mouth at bedtime.     HYDROcodone-acetaminophen (NORCO/VICODIN) 5-325 MG tablet Take 1 tablet by mouth at bedtime as needed for severe pain or moderate pain. 6 tablet 0   insulin glargine-yfgn 100 UNIT/ML SOLN INJECT 10 UNITS SUBCUTANEOUSLY DAILY (DISCARD VIAL 28 DAYS AFTER OPENING)     LANTUS 100 UNIT/ML injection Inject 0.1 mLs (10 Units total) into the skin at bedtime as needed (High blood glucose). (Patient taking differently: Inject 20 Units into the skin at bedtime as needed (High blood glucose).) 10 mL 11   metoprolol tartrate (LOPRESSOR) 25 MG tablet Take 1 tablet (25 mg total) by mouth 2 (two) times daily. 180 tablet 3   mirtazapine (REMERON) 7.5 MG tablet Take 7.5 mg by mouth at bedtime.     Multiple Vitamins-Minerals (MULTIVITAMIN WITH MINERALS) tablet Take 1 tablet by  mouth daily. 50 plus     nicotine (NICODERM CQ - DOSED IN MG/24 HOURS) 21 mg/24hr patch Place 1 patch (21 mg total) onto the skin daily as needed (Cessation of smoking). 28 patch 0   oxyCODONE-acetaminophen (PERCOCET) 5-325 MG tablet Take 1 tablet by mouth every 6 (six) hours as needed for severe pain. Do not take with Acetaminophen (Tylenol)  30 tablet 0   tamsulosin (FLOMAX) 0.4 MG CAPS capsule Take 1 capsule (0.4 mg total) by mouth daily. 30 capsule 1   traMADol (ULTRAM) 50 MG tablet Take 50 mg by mouth every 6 (six) hours as needed for moderate pain.     atorvastatin (LIPITOR) 40 MG tablet Take 1 tablet (40 mg total) by mouth at bedtime. (Patient taking differently: Take 40 mg by mouth daily.) 90 tablet 0   potassium chloride SA (KLOR-CON) 20 MEQ tablet Take 1 tablet (20 mEq total) by mouth daily for 3 doses. 3 tablet 0   No current facility-administered medications for this visit.    Allergies  Allergen Reactions   Bee Venom Anaphylaxis   Codeine Other (See Comments)    incoherent  Other reaction(s): Delirium   Propoxyphene Other (See Comments)    Dizziness, "Makes me feel drunk" Other reaction(s): Dizziness   Valsartan Other (See Comments)    incoherent Other reaction(s): Delirium     REVIEW OF SYSTEMS:   '[X]'$  denotes positive finding, '[ ]'$  denotes negative finding Cardiac  Comments:  Chest pain or chest pressure:    Shortness of breath upon exertion:    Short of breath when lying flat:    Irregular heart rhythm:        Vascular    Pain in calf, thigh, or hip brought on by ambulation:    Pain in feet at night that wakes you up from your sleep:     Blood clot in your veins:    Leg swelling:         Pulmonary    Oxygen at home:    Productive cough:     Wheezing:         Neurologic    Sudden weakness in arms or legs:     Sudden numbness in arms or legs:     Sudden onset of difficulty speaking or slurred speech:    Temporary loss of vision in one eye:     Problems  with dizziness:         Gastrointestinal    Blood in stool:     Vomited blood:         Genitourinary    Burning when urinating:     Blood in urine:        Psychiatric    Major depression:         Hematologic    Bleeding problems:    Problems with blood clotting too easily:        Skin    Rashes or ulcers:        Constitutional    Fever or chills:      PHYSICAL EXAMINATION:  Vitals:   09/21/20 1326  BP: (!) 152/80  Pulse: 85  Resp: 20  Temp: 97.9 F (36.6 C)  TempSrc: Temporal  SpO2: 98%  Height: '6\' 1"'$  (1.854 m)    General:  WDWN in NAD; vital signs documented above Gait: Not observed HENT: WNL, normocephalic Pulmonary: normal non-labored breathing , without Rales, rhonchi,  wheezing Cardiac: regular HR Abdomen: soft, NT, no masses Skin: without rashes Vascular Exam/Pulses:  Right Left  Femoral 1+ (weak) 2+ (normal)  Popliteal absent AKA  DP absent AKA  PT absent AKA   Extremities: Edema of right foot with fourth and fifth toe ulceration and gangrene of fourth toe; large muscle group atrophy of right leg Musculoskeletal: no muscle wasting or atrophy  Neurologic: A&O X 3;  No focal weakness or paresthesias are  detected Psychiatric:  The pt has Normal affect.   Non-Invasive Vascular Imaging:   Unreliable right ABI due to calcified tibial vessels    ASSESSMENT/PLAN:: 74 y.o. male here for evaluation of new wounds of right foot having recently underwent left above-the-knee amputation  -Left above-the-knee amputation well-healed -Over the past several weeks patient has developed wounds of the right fourth and fifth toe with gangrene of the fourth toe.  I presented the patient and wife with several options.  Option 1 would be continuing conservatively with wound care with his podiatrist however wounds will likely continue to progress given arterial disease.  Option 2 would be to proceed with definitive above-the-knee amputation however patient and wife are  against this option at this time.  Our third option would be to try and optimize blood flow endovascularly in hopes for limb salvage.  They would like to proceed with option 3.  This will be arranged for Dr. Virl Cagey on 09/28/2020. -The nature and risks of the arteriogram procedure were discussed with the patient and his wife and they agree to proceed   Dagoberto Ligas, PA-C [facility] [phone]  Clinic MD:   Virl Cagey

## 2020-09-23 NOTE — Addendum Note (Signed)
Addended by: Nicholas Lose on: 09/23/2020 01:15 PM   Modules accepted: Orders

## 2020-09-24 ENCOUNTER — Inpatient Hospital Stay (HOSPITAL_COMMUNITY): Payer: Medicare Other

## 2020-09-24 ENCOUNTER — Encounter (HOSPITAL_COMMUNITY): Payer: Self-pay | Admitting: Internal Medicine

## 2020-09-24 ENCOUNTER — Emergency Department (HOSPITAL_COMMUNITY): Payer: Medicare Other

## 2020-09-24 ENCOUNTER — Other Ambulatory Visit: Payer: Self-pay

## 2020-09-24 ENCOUNTER — Inpatient Hospital Stay (HOSPITAL_COMMUNITY)
Admission: EM | Admit: 2020-09-24 | Discharge: 2020-09-29 | DRG: 871 | Disposition: A | Discharge status: Home Health | Payer: Medicare Other | Attending: Internal Medicine | Admitting: Internal Medicine

## 2020-09-24 DIAGNOSIS — R0902 Hypoxemia: Secondary | ICD-10-CM

## 2020-09-24 DIAGNOSIS — E1151 Type 2 diabetes mellitus with diabetic peripheral angiopathy without gangrene: Secondary | ICD-10-CM | POA: Diagnosis present

## 2020-09-24 DIAGNOSIS — F209 Schizophrenia, unspecified: Secondary | ICD-10-CM | POA: Diagnosis present

## 2020-09-24 DIAGNOSIS — Z7189 Other specified counseling: Secondary | ICD-10-CM | POA: Diagnosis not present

## 2020-09-24 DIAGNOSIS — R911 Solitary pulmonary nodule: Secondary | ICD-10-CM | POA: Diagnosis not present

## 2020-09-24 DIAGNOSIS — I480 Paroxysmal atrial fibrillation: Secondary | ICD-10-CM | POA: Diagnosis not present

## 2020-09-24 DIAGNOSIS — F0150 Vascular dementia without behavioral disturbance: Secondary | ICD-10-CM | POA: Diagnosis not present

## 2020-09-24 DIAGNOSIS — E785 Hyperlipidemia, unspecified: Secondary | ICD-10-CM | POA: Diagnosis present

## 2020-09-24 DIAGNOSIS — N179 Acute kidney failure, unspecified: Secondary | ICD-10-CM | POA: Diagnosis present

## 2020-09-24 DIAGNOSIS — G3109 Other frontotemporal dementia: Secondary | ICD-10-CM | POA: Diagnosis not present

## 2020-09-24 DIAGNOSIS — I10 Essential (primary) hypertension: Secondary | ICD-10-CM | POA: Diagnosis not present

## 2020-09-24 DIAGNOSIS — Z515 Encounter for palliative care: Secondary | ICD-10-CM | POA: Diagnosis not present

## 2020-09-24 DIAGNOSIS — E11649 Type 2 diabetes mellitus with hypoglycemia without coma: Secondary | ICD-10-CM | POA: Diagnosis present

## 2020-09-24 DIAGNOSIS — J9601 Acute respiratory failure with hypoxia: Secondary | ICD-10-CM | POA: Diagnosis present

## 2020-09-24 DIAGNOSIS — I509 Heart failure, unspecified: Secondary | ICD-10-CM | POA: Diagnosis not present

## 2020-09-24 DIAGNOSIS — E871 Hypo-osmolality and hyponatremia: Secondary | ICD-10-CM | POA: Diagnosis present

## 2020-09-24 DIAGNOSIS — I130 Hypertensive heart and chronic kidney disease with heart failure and stage 1 through stage 4 chronic kidney disease, or unspecified chronic kidney disease: Secondary | ICD-10-CM | POA: Diagnosis present

## 2020-09-24 DIAGNOSIS — Z66 Do not resuscitate: Secondary | ICD-10-CM | POA: Diagnosis present

## 2020-09-24 DIAGNOSIS — N5089 Other specified disorders of the male genital organs: Secondary | ICD-10-CM

## 2020-09-24 DIAGNOSIS — K219 Gastro-esophageal reflux disease without esophagitis: Secondary | ICD-10-CM | POA: Diagnosis present

## 2020-09-24 DIAGNOSIS — N433 Hydrocele, unspecified: Secondary | ICD-10-CM | POA: Diagnosis not present

## 2020-09-24 DIAGNOSIS — F0390 Unspecified dementia without behavioral disturbance: Secondary | ICD-10-CM | POA: Diagnosis present

## 2020-09-24 DIAGNOSIS — R918 Other nonspecific abnormal finding of lung field: Secondary | ICD-10-CM | POA: Diagnosis not present

## 2020-09-24 DIAGNOSIS — Z79899 Other long term (current) drug therapy: Secondary | ICD-10-CM

## 2020-09-24 DIAGNOSIS — M79671 Pain in right foot: Secondary | ICD-10-CM | POA: Diagnosis not present

## 2020-09-24 DIAGNOSIS — F0280 Dementia in other diseases classified elsewhere without behavioral disturbance: Secondary | ICD-10-CM

## 2020-09-24 DIAGNOSIS — R6520 Severe sepsis without septic shock: Secondary | ICD-10-CM | POA: Diagnosis present

## 2020-09-24 DIAGNOSIS — Z781 Physical restraint status: Secondary | ICD-10-CM

## 2020-09-24 DIAGNOSIS — F4312 Post-traumatic stress disorder, chronic: Secondary | ICD-10-CM | POA: Diagnosis present

## 2020-09-24 DIAGNOSIS — E0842 Diabetes mellitus due to underlying condition with diabetic polyneuropathy: Secondary | ICD-10-CM | POA: Diagnosis not present

## 2020-09-24 DIAGNOSIS — Z9181 History of falling: Secondary | ICD-10-CM

## 2020-09-24 DIAGNOSIS — L97518 Non-pressure chronic ulcer of other part of right foot with other specified severity: Secondary | ICD-10-CM | POA: Diagnosis present

## 2020-09-24 DIAGNOSIS — E11621 Type 2 diabetes mellitus with foot ulcer: Secondary | ICD-10-CM | POA: Diagnosis present

## 2020-09-24 DIAGNOSIS — I739 Peripheral vascular disease, unspecified: Secondary | ICD-10-CM | POA: Diagnosis not present

## 2020-09-24 DIAGNOSIS — Z978 Presence of other specified devices: Secondary | ICD-10-CM

## 2020-09-24 DIAGNOSIS — F4310 Post-traumatic stress disorder, unspecified: Secondary | ICD-10-CM | POA: Diagnosis present

## 2020-09-24 DIAGNOSIS — Z91030 Bee allergy status: Secondary | ICD-10-CM

## 2020-09-24 DIAGNOSIS — N1831 Chronic kidney disease, stage 3a: Secondary | ICD-10-CM | POA: Diagnosis present

## 2020-09-24 DIAGNOSIS — F17210 Nicotine dependence, cigarettes, uncomplicated: Secondary | ICD-10-CM | POA: Diagnosis present

## 2020-09-24 DIAGNOSIS — F0391 Unspecified dementia with behavioral disturbance: Secondary | ICD-10-CM | POA: Diagnosis not present

## 2020-09-24 DIAGNOSIS — M5136 Other intervertebral disc degeneration, lumbar region: Secondary | ICD-10-CM | POA: Diagnosis not present

## 2020-09-24 DIAGNOSIS — M51369 Other intervertebral disc degeneration, lumbar region without mention of lumbar back pain or lower extremity pain: Secondary | ICD-10-CM | POA: Diagnosis present

## 2020-09-24 DIAGNOSIS — D631 Anemia in chronic kidney disease: Secondary | ICD-10-CM | POA: Diagnosis present

## 2020-09-24 DIAGNOSIS — E876 Hypokalemia: Secondary | ICD-10-CM | POA: Diagnosis present

## 2020-09-24 DIAGNOSIS — J150 Pneumonia due to Klebsiella pneumoniae: Secondary | ICD-10-CM | POA: Diagnosis present

## 2020-09-24 DIAGNOSIS — E1165 Type 2 diabetes mellitus with hyperglycemia: Secondary | ICD-10-CM

## 2020-09-24 DIAGNOSIS — I5021 Acute systolic (congestive) heart failure: Secondary | ICD-10-CM | POA: Diagnosis present

## 2020-09-24 DIAGNOSIS — A419 Sepsis, unspecified organism: Secondary | ICD-10-CM | POA: Diagnosis not present

## 2020-09-24 DIAGNOSIS — Z20822 Contact with and (suspected) exposure to covid-19: Secondary | ICD-10-CM | POA: Diagnosis present

## 2020-09-24 DIAGNOSIS — I4891 Unspecified atrial fibrillation: Secondary | ICD-10-CM | POA: Diagnosis present

## 2020-09-24 DIAGNOSIS — Z8249 Family history of ischemic heart disease and other diseases of the circulatory system: Secondary | ICD-10-CM

## 2020-09-24 DIAGNOSIS — N451 Epididymitis: Secondary | ICD-10-CM | POA: Diagnosis present

## 2020-09-24 DIAGNOSIS — E0841 Diabetes mellitus due to underlying condition with diabetic mononeuropathy: Secondary | ICD-10-CM | POA: Diagnosis not present

## 2020-09-24 DIAGNOSIS — E1142 Type 2 diabetes mellitus with diabetic polyneuropathy: Secondary | ICD-10-CM | POA: Diagnosis present

## 2020-09-24 DIAGNOSIS — G8929 Other chronic pain: Secondary | ICD-10-CM | POA: Diagnosis present

## 2020-09-24 DIAGNOSIS — Z89612 Acquired absence of left leg above knee: Secondary | ICD-10-CM

## 2020-09-24 DIAGNOSIS — E1122 Type 2 diabetes mellitus with diabetic chronic kidney disease: Secondary | ICD-10-CM | POA: Diagnosis present

## 2020-09-24 DIAGNOSIS — Z885 Allergy status to narcotic agent status: Secondary | ICD-10-CM

## 2020-09-24 DIAGNOSIS — F05 Delirium due to known physiological condition: Secondary | ICD-10-CM | POA: Diagnosis present

## 2020-09-24 DIAGNOSIS — Z888 Allergy status to other drugs, medicaments and biological substances status: Secondary | ICD-10-CM

## 2020-09-24 DIAGNOSIS — E1140 Type 2 diabetes mellitus with diabetic neuropathy, unspecified: Secondary | ICD-10-CM | POA: Diagnosis present

## 2020-09-24 DIAGNOSIS — Z794 Long term (current) use of insulin: Secondary | ICD-10-CM

## 2020-09-24 DIAGNOSIS — G9341 Metabolic encephalopathy: Secondary | ICD-10-CM | POA: Diagnosis present

## 2020-09-24 DIAGNOSIS — F2089 Other schizophrenia: Secondary | ICD-10-CM

## 2020-09-24 DIAGNOSIS — J90 Pleural effusion, not elsewhere classified: Secondary | ICD-10-CM | POA: Diagnosis not present

## 2020-09-24 DIAGNOSIS — E869 Volume depletion, unspecified: Secondary | ICD-10-CM | POA: Diagnosis present

## 2020-09-24 DIAGNOSIS — IMO0002 Reserved for concepts with insufficient information to code with codable children: Secondary | ICD-10-CM | POA: Diagnosis present

## 2020-09-24 LAB — COMPREHENSIVE METABOLIC PANEL
ALT: 15 U/L (ref 0–44)
AST: 20 U/L (ref 15–41)
Albumin: 2.9 g/dL — ABNORMAL LOW (ref 3.5–5.0)
Alkaline Phosphatase: 122 U/L (ref 38–126)
Anion gap: 11 (ref 5–15)
BUN: 14 mg/dL (ref 8–23)
CO2: 27 mmol/L (ref 22–32)
Calcium: 8.8 mg/dL — ABNORMAL LOW (ref 8.9–10.3)
Chloride: 96 mmol/L — ABNORMAL LOW (ref 98–111)
Creatinine, Ser: 1.09 mg/dL (ref 0.61–1.24)
GFR, Estimated: >60 mL/min (ref >60)
Glucose, Bld: 255 mg/dL — ABNORMAL HIGH (ref 70–99)
Potassium: 3.3 mmol/L — ABNORMAL LOW (ref 3.5–5.1)
Sodium: 134 mmol/L — ABNORMAL LOW (ref 135–145)
Total Bilirubin: 1.3 mg/dL — ABNORMAL HIGH (ref 0.3–1.2)
Total Protein: 6.5 g/dL (ref 6.5–8.1)

## 2020-09-24 LAB — CBC WITH DIFFERENTIAL/PLATELET
Basophils Absolute: 0 10*3/uL (ref 0.0–0.1)
Basophils Relative: 0 %
Eosinophils Absolute: 0 10*3/uL (ref 0.0–0.5)
Eosinophils Relative: 0 %
HCT: 25.3 % — ABNORMAL LOW (ref 39.0–52.0)
Hemoglobin: 8.2 g/dL — ABNORMAL LOW (ref 13.0–17.0)
Lymphocytes Relative: 4 %
Lymphs Abs: 0.6 10*3/uL — ABNORMAL LOW (ref 0.7–4.0)
MCH: 30.4 pg (ref 26.0–34.0)
MCHC: 32.4 g/dL (ref 30.0–36.0)
MCV: 93.7 fL (ref 80.0–100.0)
Metamyelocytes Relative: 5 %
Monocytes Absolute: 0 10*3/uL — ABNORMAL LOW (ref 0.1–1.0)
Monocytes Relative: 0 %
Neutro Abs: 14.2 10*3/uL — ABNORMAL HIGH (ref 1.7–7.7)
Neutrophils Relative %: 91 %
Platelets: 278 10*3/uL (ref 150–400)
RBC: 2.7 MIL/uL — ABNORMAL LOW (ref 4.22–5.81)
RDW: 17.6 % — ABNORMAL HIGH (ref 11.5–15.5)
WBC: 15.6 10*3/uL — ABNORMAL HIGH (ref 4.0–10.5)
nRBC: 0 % (ref 0.0–0.2)

## 2020-09-24 LAB — URINALYSIS, ROUTINE W REFLEX MICROSCOPIC
Bilirubin Urine: NEGATIVE
Glucose, UA: 250 mg/dL — AB
Ketones, ur: NEGATIVE mg/dL
Nitrite: NEGATIVE
Protein, ur: 100 mg/dL — AB
Specific Gravity, Urine: 1.015 (ref 1.005–1.030)
pH: 7 (ref 5.0–8.0)

## 2020-09-24 LAB — URINALYSIS, MICROSCOPIC (REFLEX)

## 2020-09-24 LAB — RESP PANEL BY RT-PCR (FLU A&B, COVID) ARPGX2
Influenza A by PCR: NEGATIVE
Influenza B by PCR: NEGATIVE
SARS Coronavirus 2 by RT PCR: NEGATIVE

## 2020-09-24 LAB — LACTIC ACID, PLASMA
Lactic Acid, Venous: 3 mmol/L (ref 0.5–1.9)
Lactic Acid, Venous: 3.3 mmol/L (ref 0.5–1.9)
Lactic Acid, Venous: 2.6 mmol/L (ref 0.5–1.9)
Lactic Acid, Venous: 2.3 mmol/L (ref 0.5–1.9)
Lactic Acid, Venous: 2.3 mmol/L (ref 0.5–1.9)
Lactic Acid, Venous: 1.7 mmol/L (ref 0.5–1.9)

## 2020-09-24 LAB — APTT: aPTT: 28 seconds (ref 24–36)

## 2020-09-24 LAB — GLUCOSE, CAPILLARY
Glucose-Capillary: 161 mg/dL — ABNORMAL HIGH (ref 70–99)
Glucose-Capillary: 149 mg/dL — ABNORMAL HIGH (ref 70–99)
Glucose-Capillary: 115 mg/dL — ABNORMAL HIGH (ref 70–99)
Glucose-Capillary: 68 mg/dL — ABNORMAL LOW (ref 70–99)

## 2020-09-24 LAB — PROCALCITONIN: Procalcitonin: 45.49 ng/mL

## 2020-09-24 LAB — PROTIME-INR
INR: 1.3 — ABNORMAL HIGH (ref 0.8–1.2)
Prothrombin Time: 15.9 seconds — ABNORMAL HIGH (ref 11.4–15.2)

## 2020-09-24 LAB — MRSA NEXT GEN BY PCR, NASAL: MRSA by PCR Next Gen: NOT DETECTED

## 2020-09-24 MED ORDER — TAMSULOSIN HCL 0.4 MG PO CAPS
0.4000 mg | ORAL_CAPSULE | Freq: Every day | ORAL | Status: DC
  Administered 2020-09-25 – 2020-09-29 (×4): 0.4 mg via ORAL
  Filled 2020-09-24 (×4): qty 1

## 2020-09-24 MED ORDER — ACETAMINOPHEN 500 MG PO TABS
1000.0000 mg | ORAL_TABLET | Freq: Once | ORAL | Status: AC
  Administered 2020-09-24: 1000 mg via ORAL
  Filled 2020-09-24: qty 2

## 2020-09-24 MED ORDER — LACTATED RINGERS IV SOLN: INTRAVENOUS | Status: AC

## 2020-09-24 MED ORDER — METOPROLOL TARTRATE 25 MG PO TABS
25.0000 mg | ORAL_TABLET | Freq: Two times a day (BID) | ORAL | Status: DC
  Administered 2020-09-24 – 2020-09-29 (×8): 25 mg via ORAL
  Filled 2020-09-24 (×8): qty 1

## 2020-09-24 MED ORDER — METRONIDAZOLE 500 MG/100ML IV SOLN
500.0000 mg | Freq: Once | INTRAVENOUS | Status: AC
  Administered 2020-09-24: 500 mg via INTRAVENOUS
  Filled 2020-09-24: qty 100

## 2020-09-24 MED ORDER — FOLIC ACID 5 MG/ML IJ SOLN
1.0000 mg | Freq: Every day | INTRAMUSCULAR | Status: DC
  Administered 2020-09-24 – 2020-09-28 (×5): 1 mg via INTRAVENOUS
  Filled 2020-09-24 (×5): qty 0.2

## 2020-09-24 MED ORDER — LACTATED RINGERS IV BOLUS
500.0000 mL | Freq: Once | INTRAVENOUS | Status: AC
  Administered 2020-09-24: 500 mL via INTRAVENOUS

## 2020-09-24 MED ORDER — GABAPENTIN 100 MG PO CAPS
100.0000 mg | ORAL_CAPSULE | Freq: Every day | ORAL | Status: DC
  Administered 2020-09-24 – 2020-09-28 (×4): 100 mg via ORAL
  Filled 2020-09-24 (×4): qty 1

## 2020-09-24 MED ORDER — LACTATED RINGERS IV BOLUS
1000.0000 mL | Freq: Once | INTRAVENOUS | Status: AC
  Administered 2020-09-24: 1000 mL via INTRAVENOUS

## 2020-09-24 MED ORDER — THIAMINE HCL 100 MG/ML IJ SOLN
100.0000 mg | Freq: Every day | INTRAMUSCULAR | Status: DC
  Administered 2020-09-24 – 2020-09-28 (×5): 100 mg via INTRAVENOUS
  Filled 2020-09-24 (×6): qty 2

## 2020-09-24 MED ORDER — SENNOSIDES-DOCUSATE SODIUM 8.6-50 MG PO TABS: 1.0000 | ORAL_TABLET | Freq: Every evening | ORAL | Status: DC | PRN

## 2020-09-24 MED ORDER — RISPERIDONE 0.25 MG PO TABS
0.2500 mg | ORAL_TABLET | Freq: Two times a day (BID) | ORAL | Status: DC
  Administered 2020-09-24 – 2020-09-27 (×5): 0.25 mg via ORAL
  Filled 2020-09-24 (×13): qty 1

## 2020-09-24 MED ORDER — ACETAMINOPHEN 325 MG PO TABS: 650.0000 mg | ORAL_TABLET | Freq: Four times a day (QID) | ORAL | Status: DC | PRN

## 2020-09-24 MED ORDER — FINASTERIDE 5 MG PO TABS
5.0000 mg | ORAL_TABLET | Freq: Every day | ORAL | Status: DC
  Administered 2020-09-25 – 2020-09-29 (×4): 5 mg via ORAL
  Filled 2020-09-24 (×4): qty 1

## 2020-09-24 MED ORDER — TRAMADOL HCL 50 MG PO TABS
50.0000 mg | ORAL_TABLET | Freq: Four times a day (QID) | ORAL | Status: DC | PRN
  Administered 2020-09-27: 50 mg via ORAL
  Filled 2020-09-24: qty 1

## 2020-09-24 MED ORDER — SODIUM CHLORIDE 0.9 % IV SOLN
2.0000 g | Freq: Once | INTRAVENOUS | Status: AC
  Administered 2020-09-24: 2 g via INTRAVENOUS
  Filled 2020-09-24: qty 2

## 2020-09-24 MED ORDER — ENSURE ENLIVE PO LIQD
237.0000 mL | Freq: Two times a day (BID) | ORAL | Status: DC
  Administered 2020-09-26 – 2020-09-29 (×2): 237 mL via ORAL

## 2020-09-24 MED ORDER — VANCOMYCIN HCL IN DEXTROSE 1-5 GM/200ML-% IV SOLN
1000.0000 mg | Freq: Once | INTRAVENOUS | Status: AC
  Administered 2020-09-24: 1000 mg via INTRAVENOUS
  Filled 2020-09-24: qty 200

## 2020-09-24 MED ORDER — MIRTAZAPINE 15 MG PO TABS
7.5000 mg | ORAL_TABLET | Freq: Every day | ORAL | Status: DC
  Administered 2020-09-24 – 2020-09-28 (×4): 7.5 mg via ORAL
  Filled 2020-09-24 (×4): qty 1

## 2020-09-24 MED ORDER — SODIUM CHLORIDE 0.9 % IV SOLN: 2.0000 g | Freq: Once | INTRAVENOUS | Status: DC

## 2020-09-24 MED ORDER — CEFEPIME HCL 2 G IJ SOLR
2.0000 g | Freq: Two times a day (BID) | INTRAMUSCULAR | Status: DC
  Administered 2020-09-24 – 2020-09-26 (×3): 2 g via INTRAVENOUS
  Filled 2020-09-24 (×3): qty 2

## 2020-09-24 MED ORDER — POTASSIUM CHLORIDE IN NACL 20-0.9 MEQ/L-% IV SOLN
INTRAVENOUS | Status: DC
  Filled 2020-09-24: qty 1000

## 2020-09-24 MED ORDER — FERROUS SULFATE 325 (65 FE) MG PO TABS
325.0000 mg | ORAL_TABLET | Freq: Every day | ORAL | Status: DC
  Administered 2020-09-25 – 2020-09-29 (×4): 325 mg via ORAL
  Filled 2020-09-24 (×4): qty 1

## 2020-09-24 MED ORDER — LACTATED RINGERS IV BOLUS (SEPSIS)
1000.0000 mL | Freq: Once | INTRAVENOUS | Status: AC
  Administered 2020-09-24: 1000 mL via INTRAVENOUS

## 2020-09-24 MED ORDER — VANCOMYCIN HCL IN DEXTROSE 1-5 GM/200ML-% IV SOLN: 1000.0000 mg | Freq: Once | INTRAVENOUS | Status: DC

## 2020-09-24 MED ORDER — ENOXAPARIN SODIUM 30 MG/0.3ML IJ SOSY
30.0000 mg | PREFILLED_SYRINGE | INTRAMUSCULAR | Status: DC
  Administered 2020-09-24: 30 mg via SUBCUTANEOUS
  Filled 2020-09-24: qty 0.3

## 2020-09-24 MED ORDER — ACETAMINOPHEN 650 MG RE SUPP: 650.0000 mg | Freq: Four times a day (QID) | RECTAL | Status: DC | PRN

## 2020-09-24 MED ORDER — CHLORHEXIDINE GLUCONATE CLOTH 2 % EX PADS
6.0000 | MEDICATED_PAD | Freq: Every day | CUTANEOUS | Status: DC
  Administered 2020-09-25 – 2020-09-28 (×5): 6 via TOPICAL

## 2020-09-24 MED ORDER — VANCOMYCIN HCL 750 MG/150ML IV SOLN
750.0000 mg | INTRAVENOUS | Status: DC
  Administered 2020-09-25 – 2020-09-26 (×2): 750 mg via INTRAVENOUS
  Filled 2020-09-24 (×4): qty 150

## 2020-09-24 MED ORDER — DONEPEZIL HCL 5 MG PO TABS
10.0000 mg | ORAL_TABLET | Freq: Every day | ORAL | Status: DC
  Administered 2020-09-24 – 2020-09-28 (×4): 10 mg via ORAL
  Filled 2020-09-24 (×4): qty 2

## 2020-09-24 MED ORDER — SODIUM CHLORIDE 0.9 % IV SOLN
500.0000 mg | INTRAVENOUS | Status: DC
  Administered 2020-09-24 – 2020-09-25 (×2): 500 mg via INTRAVENOUS
  Filled 2020-09-24 (×4): qty 500

## 2020-09-24 MED ORDER — INSULIN ASPART 100 UNIT/ML IJ SOLN
0.0000 [IU] | INTRAMUSCULAR | Status: DC
  Administered 2020-09-26: 2 [IU] via SUBCUTANEOUS
  Administered 2020-09-26 – 2020-09-27 (×3): 3 [IU] via SUBCUTANEOUS
  Administered 2020-09-27: 8 [IU] via SUBCUTANEOUS
  Administered 2020-09-27: 3 [IU] via SUBCUTANEOUS
  Administered 2020-09-27: 5 [IU] via SUBCUTANEOUS
  Administered 2020-09-27: 3 [IU] via SUBCUTANEOUS
  Administered 2020-09-27: 8 [IU] via SUBCUTANEOUS
  Administered 2020-09-28: 5 [IU] via SUBCUTANEOUS
  Administered 2020-09-28: 2 [IU] via SUBCUTANEOUS
  Administered 2020-09-28: 3 [IU] via SUBCUTANEOUS
  Administered 2020-09-29: 15 [IU] via SUBCUTANEOUS

## 2020-09-24 NOTE — ED Notes (Signed)
Call placed to wife to update on plan of care and inpatient admission.  Bed assigned

## 2020-09-24 NOTE — ED Triage Notes (Signed)
Pov from home with family. Family brought him in with fever. She got 100.9. 102.5 in triage.  Pt is hard of hearing. Demented. PTSD and schizophrenic. Pt does not know why he is here.

## 2020-09-24 NOTE — H&P (Signed)
[facility] History and Physical  Allen Yu [id number] DOB: 18-Aug-1945 DOA: 09/24/2020  Referring physician:  PCP: [name], MD   Chief Complaint: Severe sepsis  HPI: Allen Yu is a 75 y.o. BM from SNF PMHx PTSD, schizophrenia, dementia, PVD, s/p LEFT AKA, paroxysmal atrial fibrillation, essential HTN, lung nodule, nephrolithiasis, renal retention s/p indwelling Foley, DM type II with complication, DM neuropathy , nephrolithiasis, chronic back pain  Wife reports when she woke up this morning the patient was making" gulping noise" and seemed to have difficulty breathing.  She checked his temperature is 100.9.  On arrival his temperature is 102.5.  Wife reports no cough, runny nose or sore throat.  No vomiting or diarrhea.  He has had an indwelling Foley catheter since June but it was changed about 3 days ago. He has a known wound to his fourth and fifth toe on the right and is being scheduled for an aortogram by vascular surgery next week.  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  ADDENDUM; proceeded to bedside in ICU to check on patient's testicular mass patient very combative unable to get a straight answer from patient.  Patient stating why the hell am I here and I cannot do anything to help him.   Review of Systems:  Covid vaccination;  Constitutional:  No weight loss, night sweats, positive fevers, , positive chills, no fatigue.  HEENT:  No headaches, Difficulty swallowing,Tooth/dental problems,Sore throat,  No sneezing, itching, ear ache, nasal congestion, post nasal drip,  Cardio-vascular:  No chest pain, Orthopnea, PND, swelling in lower extremities, anasarca, dizziness, palpitations  GI:  No heartburn, indigestion, abdominal pain, nausea, vomiting, diarrhea, change in bowel habits, loss of appetite  Resp:  No shortness of breath with exertion or at rest. No excess mucus, no productive cough, No non-productive cough, No coughing up of blood.No change in color of  mucus.No wheezing.No chest wall deformity  Skin:  no rash or lesions.  GU:  no dysuria, change in color of urine, no urgency or frequency. No flank pain.  Musculoskeletal:  No joint pain or swelling. No decreased range of motion. No back pain.  Psych:  No change in mood or affect. No depression or anxiety. No memory loss. positive belligerent/uncooperative  Past Medical History:  Diagnosis Date   Anemia    Arthritis    Carotid stenosis    Chronic back pain    Chronic kidney disease    Constipation    Dementia (HCC)    Diabetes mellitus    GERD (gastroesophageal reflux disease)    History of kidney stones    Hypertension    Lung nodule    PAF (paroxysmal atrial fibrillation) (HCC)    Peripheral neuropathy    Peripheral vascular disease (HCC)    PTSD (post-traumatic stress disorder)    Schizophrenia ([place])    Tuberculosis    Treated   Past Surgical History:  Procedure Laterality Date   ABDOMINAL AORTOGRAM W/LOWER EXTREMITY N/A 05/20/2020   Procedure: ABDOMINAL AORTOGRAM W/LOWER EXTREMITY;  Surgeon: Elam Dutch, MD;  Location: [place] [facility];  Service: Cardiovascular;  Laterality: N/A;   AMPUTATION Left 06/28/2020   Procedure: AMPUTATION ABOVE KNEE LEFT;  Surgeon: Rosetta Posner, MD;  Location: [facility] OR;  Service: Vascular;  Laterality: Left;   [place]  2000, 2013   x2   BLADDER SURGERY     02   CHOLECYSTECTOMY     COLONOSCOPY     COLONOSCOPY  N/A 12/07/2014   Procedure: COLONOSCOPY;  Surgeon: Daneil Dolin, MD;  [facility];  Service: Endoscopy;  Laterality: N/A;  10:30 Am   EYE SURGERY Bilateral    removed metal from eye   FEMORAL-TIBIAL BYPASS GRAFT Left 05/27/2020   Procedure: LEFT FEMORAL TO PERONEAL ARTERY BYPASS;  Surgeon: Rosetta Posner, MD;  Location: [place];  Service: Vascular;  Laterality: Left;   HERNIA REPAIR Right    INGUINAL HERNIA REPAIR Left 11/03/2013   Procedure: HERNIA REPAIR INGUINAL ADULT;  Surgeon: Gayland Curry,  MD;  Location: [place];  Service: General;  Laterality: Left;   SHOULDER SURGERY     RIGHT SHOULDER    VIDEO BRONCHOSCOPY WITH ENDOBRONCHIAL NAVIGATION N/A 07/10/2019   Procedure: VIDEO BRONCHOSCOPY WITH ENDOBRONCHIAL NAVIGATION;  Surgeon: Melrose Nakayama, MD;  Location: [place];  Service: Thoracic;  Laterality: N/A;   Social History:  reports that he has been smoking cigarettes. He has been smoking an average of .5 packs per day. He has never used smokeless tobacco. He reports current drug use. Drug: Marijuana. He reports that he does not drink alcohol.  Allergies  Allergen Reactions   Bee Venom Anaphylaxis   Codeine Other (See Comments)    incoherent  Other reaction(s): Delirium   Propoxyphene Other (See Comments)    Dizziness, "Makes me feel drunk" Other reaction(s): Dizziness   Valsartan Other (See Comments)    incoherent Other reaction(s): Delirium    Family History  Problem Relation Age of Onset   Heart disease Mother        before age 88     Prior to Admission medications   Medication Sig Start Date End Date Taking? Authorizing Provider  acetaminophen (TYLENOL) 500 MG tablet Take 1,000 mg by mouth every 6 (six) hours as needed for moderate pain.    [provider]  amiodarone (PACERONE) 200 MG tablet Take 1 tablet (200 mg total) by mouth daily. Patient not taking: Reported on 09/22/2020 07/26/20   Orson Eva, MD  atorvastatin (LIPITOR) 40 MG tablet Take 1 tablet (40 mg total) by mouth at bedtime. 05/21/20 09/22/20  [place] Eric J, DO  B-D UF III MINI PEN NEEDLES 31G X 5 MM MISC SMARTSIG:1 Each SUB-Q Daily 12/23/19   [provider]  Bacitracin-Polymyxin B (NEOSPORIN EX) Apply 1 application topically daily as needed (wound care).    [provider]  cefdinir (OMNICEF) 300 MG capsule Take 1 capsule (300 mg total) by mouth 2 (two) times daily. Patient not taking: Reported on 09/22/2020 07/26/20   Orson Eva, MD  donepezil (ARICEPT) 10 MG tablet Take 10 mg by  mouth at bedtime.  06/28/16   [provider]  ferrous sulfate 325 (65 FE) MG tablet Take 325 mg by mouth daily.    [provider]  finasteride (PROSCAR) 5 MG tablet Take 5 mg by mouth daily. 08/23/20   [provider]  gabapentin (NEURONTIN) 100 MG capsule Take 100 mg by mouth at bedtime. 11/06/19   [provider]  HYDROcodone-acetaminophen (NORCO/VICODIN) 5-325 MG tablet Take 1 tablet by mouth at bedtime as needed for severe pain or moderate pain. Patient not taking: Reported on 09/22/2020 08/02/20   Evalee Jefferson, PA-C  LANTUS 100 UNIT/ML injection Inject 0.1 mLs (10 Units total) into the skin at bedtime as needed (High blood glucose). Patient taking differently: Inject 25 Units into the skin at bedtime. 06/30/20   Mercy Riding, MD  metoprolol tartrate (LOPRESSOR) 25 MG tablet Take  1 tablet (25 mg total) by mouth 2 (two) times daily. 06/30/20 06/30/21  Mercy Riding, MD  mirtazapine (REMERON) 7.5 MG tablet Take 7.5 mg by mouth at bedtime. 08/12/20   [provider]  nicotine (NICODERM CQ - DOSED IN MG/24 HOURS) 21 mg/24hr patch Place 1 patch (21 mg total) onto the skin daily as needed (Cessation of smoking). Patient not taking: No sig reported 06/30/20   Mercy Riding, MD  oxyCODONE-acetaminophen (PERCOCET) 5-325 MG tablet Take 1 tablet by mouth every 6 (six) hours as needed for severe pain. Do not take with Acetaminophen (Tylenol) Patient not taking: No sig reported 07/01/20 07/01/21  Baglia, Corrina, PA-C  potassium chloride SA (KLOR-CON) 20 MEQ tablet Take 1 tablet (20 mEq total) by mouth daily for 3 doses. Patient not taking: No sig reported 08/15/20 09/22/20  Sponseller, Eugene Garnet R, PA-C  risperiDONE (RISPERDAL) 0.25 MG tablet Take 0.25 mg by mouth 2 (two) times daily.    [provider]  tamsulosin (FLOMAX) 0.4 MG CAPS capsule Take 1 capsule (0.4 mg total) by mouth daily. 07/26/20   Orson Eva, MD  traMADol (ULTRAM) 50 MG tablet Take 50 mg by mouth every 6  (six) hours as needed for moderate pain. 07/18/20   [provider]     Consultants:    Procedures/Significant Events:  9/3 scrotal ultrasound: -Heterogeneous right epididymis with increased vascularity suspicious for epididymitis. -Heterogeneous appearance of the intrascrotal fat in the right with possible fat containing inguinal hernia. This is not definite, and not well assessed by ultrasound. -Small to moderate right hydrocele. -Normal sonographic appearance of both testicles without intratesticular mass.  I have personally reviewed and interpreted all radiology studies and my findings are as above.   VENTILATOR SETTINGS:    Cultures   Antimicrobials: Anti-infectives (From admission, onward)    Start     Ordered Stop   09/25/20 0800  vancomycin (VANCOREADY) IVPB 750 mg/150 mL        09/24/20 0740     09/24/20 1900  ceFEPIme (MAXIPIME) 2 g in sodium chloride 0.9 % 100 mL IVPB        09/24/20 0740     09/24/20 1100  vancomycin (VANCOCIN) IVPB 1000 mg/200 mL premix  Status:  Discontinued        09/24/20 1057 09/24/20 1107   09/24/20 1100  ceFEPIme (MAXIPIME) 2 g in sodium chloride 0.9 % 100 mL IVPB  Status:  Discontinued        09/24/20 1057 09/24/20 1107   09/24/20 1100  azithromycin (ZITHROMAX) 500 mg in sodium chloride 0.9 % 250 mL IVPB        09/24/20 1057 09/29/20 1059   09/24/20 0645  ceFEPIme (MAXIPIME) 2 g in sodium chloride 0.9 % 100 mL IVPB        09/24/20 0632 09/24/20 0743   09/24/20 0645  metroNIDAZOLE (FLAGYL) IVPB 500 mg        09/24/20 [id number] 09/24/20 0907   09/24/20 0645  vancomycin (VANCOCIN) IVPB 1000 mg/200 mL premix        09/24/20 [id number] 09/24/20 1030         Devices    LINES / TUBES:      Continuous Infusions:  0.9 % NaCl with KCl 20 mEq / L 100 mL/hr at 09/24/20 1209   azithromycin 500 mg (09/24/20 1209)   ceFEPime (MAXIPIME) IV     lactated ringers 150 mL/hr at 09/24/20 0938   [START ON 09/25/2020] vancomycin  Physical  Exam: Vitals:   09/24/20 1100 09/24/20 1200 09/24/20 1252 09/24/20 1300  BP: 98/60 118/80  115/74  Pulse:  (!) 107  (!) 103  Resp: (!) '9 20  18  '$ Temp:  99.7 F (37.6 C)  98.7 F (37.1 C)  TempSrc:  Oral  Oral  SpO2:  98%  99%  Weight:   51.7 kg   Height:        Wt Readings from Last 3 Encounters:  09/24/20 51.7 kg  08/02/20 44.5 kg  07/23/20 44.5 kg    General: Alert but combative, will follow some commands, No acute respiratory distress, cachectic Eyes: negative scleral hemorrhage, negative anisocoria, negative icterus ENT: Negative Runny nose, negative gingival bleeding, Neck:  Negative scars, masses, torticollis, lymphadenopathy, JVD Lungs: Clear to auscultation bilaterally without wheezes or crackles Cardiovascular: Regular rate and rhythm without murmur gallop or rub normal S1 and S2 Abdomen: negative abdominal pain, nondistended, positive soft, bowel sounds, no rebound, no ascites, no appreciable mass Extremities: No significant cyanosis, clubbing, or LEFT AKA well-healed GU; painful scrotum to palpation, enlarged Skin: Negative rashes, lesions, ulcers Psychiatric: Unable to evaluate patient combative, initially somnolent but when awoke Central nervous system:  Cranial nerves II through XII intact, tongue/uvula midline, all extremities muscle strength 5/5, sensation intact throughout, negative dysarthria, negative expressive aphasia, negative receptive aphasia.  Combative, uncooperative.        Labs on Admission:  Basic Metabolic Panel: Recent Labs  Lab 09/24/20 0640  NA 134*  K 3.3*  CL 96*  CO2 27  GLUCOSE 255*  BUN 14  CREATININE 1.09  CALCIUM 8.8*   Liver Function Tests: Recent Labs  Lab 09/24/20 0640  AST 20  ALT 15  ALKPHOS 122  BILITOT 1.3*  PROT 6.5  ALBUMIN 2.9*   No results for input(s): LIPASE, AMYLASE in the last 168 hours. No results for input(s): AMMONIA in the last 168 hours. CBC: Recent Labs  Lab 09/24/20 0640  WBC 15.6*   NEUTROABS 14.2*  HGB 8.2*  HCT 25.3*  MCV 93.7  PLT 278   Cardiac Enzymes: No results for input(s): CKTOTAL, CKMB, CKMBINDEX, TROPONINI in the last 168 hours.  BNP (last 3 results) No results for input(s): BNP in the last 8760 hours.  ProBNP (last 3 results) No results for input(s): PROBNP in the last 8760 hours.  CBG: Recent Labs  Lab 09/24/20 1255  GLUCAP 161*    Radiological Exams on Admission: DG Chest Port 1 View  Result Date: 09/24/2020 CLINICAL DATA:  Questionable sepsis, dementia EXAM: PORTABLE CHEST 1 VIEW COMPARISON:  07/21/2020 chest radiograph. FINDINGS: Stable cardiomediastinal silhouette with normal heart size. No pneumothorax. No pleural effusion. No pulmonary edema. Mild patchy left retrocardiac opacity. IMPRESSION: Mild patchy left retrocardiac opacity, which could represent atelectasis, aspiration, or pneumonia. Follow-up chest radiograph suggested. Electronically Signed   By: Ilona Sorrel M.D.   On: 09/24/2020 07:24   DG Foot 2 Views Right  Result Date: 09/24/2020 CLINICAL DATA:  sepsis, fever, right foot pain in blistering, no reported injury EXAM: RIGHT FOOT - 2 VIEW COMPARISON:  None FINDINGS: Vascular calcifications throughout the soft tissues. No fracture or dislocation. No focal osseous lesions. No cortical erosions or periosteal reaction. No radiopaque foreign bodies. No significant arthropathy. IMPRESSION: No acute osseous abnormality. Vascular calcifications throughout the soft tissues, suggesting diabetes mellitus. Electronically Signed   By: Ilona Sorrel M.D.   On: 09/24/2020 07:27    EKG: Independently reviewed.   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 (HCC)   S/P AKA (above knee amputation) unilateral, left (HCC)   Diabetic neuropathy (HCC)   DDD (degenerative disc disease),  lumbar   Diabetes mellitus type 2, uncontrolled (HCC)   AF (paroxysmal atrial fibrillation) (HCC)   Schizophrenia ([place])   Dementia without behavioral disturbance (HCC)   Lung nodule   Indwelling Foley catheter present   PVD (peripheral vascular disease) (HCC)   Mass of testicle  Severe sepsis -Procalcitonin/lactic acid - Continue current antibiotics x7 days; will adjust antibiotics according to procalcitonin -Normal saline 139m/hr  RIGHT mass of testicle - Ultrasound scrotum revealed epididymitis current antibiotics should cover infection. - If no improvement will contact urology.  Will monitor closely for urinary retention - Strict in and out  Essential HTN -Metoprolol 25 mg BID - We will hold all additional BP medication secondary to severe sepsis   Paroxysmal atrial fibrillation -See HTN  DM type II uncontrolled with complication -Moderate SSI  DM foot ulcers -Chronic will not address at this time.  PVD  Chronic back pain/lumbar DDD -Tramadol 50 mg QID PRN -Remeron 7.5 mg daily  Schizophrenia -Risperidone 0.25 mg BID  Dementia without behavioral disturbance -Aiicept 10 mg daily-Remeron 7.5 mg nightly    Code Status: Full (DVT Prophylaxis: Lovenox Family Communication:  Status is: Inpatient    Dispo: The patient is from: Home              Anticipated d/c is to: Home              Anticipated d/c date is: > 3 days              Patient currently is not medically stable to d/c.     Data Reviewed: Care during the described time interval was provided by me .  I have reviewed this patient's available data, including medical history, events of note, physical examination, and all test results as part of my evaluation.   The patient is critically ill with multiple organ systems failure and requires high complexity decision making for assessment and support, frequent evaluation and titration of therapies, application of advanced monitoring technologies and  extensive interpretation of multiple databases. Critical Care Time devoted to patient care services described in this note  Time spent: 70 minutes   Alberto Pina, CB[facility]

## 2020-09-24 NOTE — Progress Notes (Signed)
Pharmacy Antibiotic Note  Allen Yu is a 75 y.o. male admitted on 09/24/2020 with sepsis. Presented with fever. Pharmacy has been consulted for Vancomycin and Cefepime dosing x 7 days.  Estimated wt ~99 lbs (pt is double amputee)  Plan: Cefepime 2gm now Vancomycin '1000mg'$  IV now  Will f/u renal function for further doses   Height: '6\' 1"'$  (185.4 cm) IBW/kg (Calculated) : 79.9  Temp (24hrs), Avg:102.1 F (38.9 C), Min:102.1 F (38.9 C), Max:102.1 F (38.9 C)  No results for input(s): WBC, CREATININE, LATICACIDVEN, VANCOTROUGH, VANCOPEAK, VANCORANDOM, GENTTROUGH, GENTPEAK, GENTRANDOM, TOBRATROUGH, TOBRAPEAK, TOBRARND, AMIKACINPEAK, AMIKACINTROU, AMIKACIN in the last 168 hours.  CrCl cannot be calculated (Patient's most recent lab result is older than the maximum 21 days allowed.).    Allergies  Allergen Reactions   Bee Venom Anaphylaxis   Codeine Other (See Comments)    incoherent  Other reaction(s): Delirium   Propoxyphene Other (See Comments)    Dizziness, "Makes me feel drunk" Other reaction(s): Dizziness   Valsartan Other (See Comments)    incoherent Other reaction(s): Delirium    Antimicrobials this admission: 9/3 Vanc >>  9/3 Cefepime >>   Microbiology results: 9/3 BCx:   UCx:    Thank you for allowing pharmacy to be a part of this patient's care.  Sherlon Handing, PharmD, BCPS Please see amion for complete clinical pharmacist phone list 09/24/2020 6:35 AM

## 2020-09-24 NOTE — ED Notes (Signed)
Placed seizure pads around pt per RN Burman Nieves

## 2020-09-24 NOTE — ED Provider Notes (Signed)
[facility] EMERGENCY DEPARTMENT Provider Note   CSN: [id number] Arrival date & time: 09/24/20  [id number]     History Chief Complaint  Patient presents with   Fever    Allen Yu is a 75 y.o. male.  Level 5 caveat for altered mental status.  Patient brought in from home with fevers noticed this morning.  Patient has a history of schizophrenia, PTSD and dementia is unable to give a history.  Wife reports when she woke up this morning the patient was making" gulping noise" and seemed to have difficulty breathing.  She checked his temperature is 100.9.  On arrival his temperature is 102.5.  Wife reports no cough, runny nose or sore throat.  No vomiting or diarrhea.  He has had an indwelling Foley catheter since June but it was changed about 3 days ago. He has a known wound to his fourth and fifth toe on the right and is being scheduled for an aortogram by vascular surgery next week.  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 No abdominal pain, chest pain, shortness of breath, vomiting or diarrhea.  No pain with urination or blood in the urine.  He is COVID vaccinated.  The history is provided by the patient and a relative. The history is limited by the condition of the patient.  Fever     Past Medical History:  Diagnosis Date   Anemia    Arthritis    Carotid stenosis    Chronic back pain    Chronic kidney disease    Constipation    Dementia (HCC)    Diabetes mellitus    GERD (gastroesophageal reflux disease)    History of kidney stones    Hypertension    Lung nodule    PAF (paroxysmal atrial fibrillation) ([place])    Peripheral neuropathy    Peripheral vascular disease (HCC)    PTSD (post-traumatic stress disorder)    Schizophrenia ([place])    Tuberculosis    Treated    Patient Active Problem List   Diagnosis Date Noted   Problem related to unspecified psychosocial circumstances 08/01/2020   Hypermetropia 07/22/2020   AF (paroxysmal atrial fibrillation) ([place]) 07/22/2020   Klebsiella  sepsis ([place]) 07/22/2020   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   SIRS (systemic inflammatory response syndrome) ([place]) XX123456   Acute metabolic encephalopathy XX123456   Hyponatremia    S/P AKA (above knee amputation) unilateral, left ([place]) 06/28/2020   Gangrene of left foot ([place]) 06/28/2020   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 ([place]) 05/27/2020   Critical lower limb ischemia ([place]) 05/21/2020   Protein-calorie malnutrition, severe 05/19/2020   Plantar callus 12/24/2019   Cavitary pneumonia 09/14/2019   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   Unintentional weight loss 09/14/2019   Coronary artery calcification seen on CT scan 10/28/2018   Hilar adenopathy 04/10/2016   Lung nodule seen on imaging study 07/09/2015   Acute encephalopathy    Hypoglycemia due to insulin    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[place]) 07/08/2015   Acute kidney injury superimposed on chronic kidney disease ([place]) 07/08/2015   Tobacco use disorder 07/08/2015   Diverticulosis of colon without hemorrhage    Left inguinal hernia 09/16/2013   Altered mental status 11/17/2012   Hypothermia 11/17/2012   Leukocytosis 11/17/2012   CKD (chronic kidney disease), stage II    PTSD (post-traumatic stress disorder)    GERD (gastroesophageal reflux disease)    Essential (primary) hypertension 09/10/2011   Anemia in chronic illness 09/10/2011  Diabetic neuropathy ([place]) 09/10/2011   DDD (degenerative disc disease), lumbar 09/10/2011   Psychosis ([place]) 09/10/2011   Diabetes mellitus type 2, uncontrolled ([place]) 09/10/2011   HLD (hyperlipidemia) 09/10/2011   Microalbuminuria 09/10/2011    Past Surgical History:  Procedure Laterality Date   ABDOMINAL AORTOGRAM W/LOWER EXTREMITY N/A 05/20/2020   Procedure: ABDOMINAL AORTOGRAM W/LOWER EXTREMITY;  Surgeon: Elam Dutch, MD;  Location: [facility];  Service: Cardiovascular;  Laterality: N/A;   AMPUTATION Left 06/28/2020   Procedure:  AMPUTATION ABOVE KNEE LEFT;  Surgeon: Rosetta Posner, MD;  Location: [facility] OR;  Service: Vascular;  Laterality: Left;   [place], 2013   x2   BLADDER SURGERY     02   CHOLECYSTECTOMY     COLONOSCOPY     COLONOSCOPY N/A 12/07/2014   Procedure: COLONOSCOPY;  Surgeon: Daneil Dolin, MD;  Location[facility];  Service: Endoscopy;  Laterality: N/A;  10:30 Am   EYE SURGERY Bilateral    removed metal from eye   FEMORAL-TIBIAL BYPASS GRAFT Left 05/27/2020   Procedure: LEFT FEMORAL TO PERONEAL ARTERY BYPASS;  Surgeon: Rosetta Posner, MD;  Location: [place];  Service: Vascular;  Laterality: Left;   HERNIA REPAIR Right    INGUINAL HERNIA REPAIR Left 11/03/2013   Procedure: HERNIA REPAIR INGUINAL ADULT;  Surgeon: Gayland Curry, MD;  Location: [place];  Service: General;  Laterality: Left;   SHOULDER SURGERY     RIGHT SHOULDER    VIDEO BRONCHOSCOPY WITH ENDOBRONCHIAL NAVIGATION N/A 07/10/2019   Procedure: VIDEO BRONCHOSCOPY WITH ENDOBRONCHIAL NAVIGATION;  Surgeon: Melrose Nakayama, MD;  Location: [facility] OR;  Service: Thoracic;  Laterality: N/A;       Family History  Problem Relation Age of Onset   Heart disease Mother        before age 43    Social History   Tobacco Use   Smoking status: Every Day    Packs/day: 0.50    Types: Cigarettes   Smokeless tobacco: Never   Tobacco comments:    burns them up  Vaping Use   Vaping Use: Never used  Substance Use Topics   Alcohol use: No    Alcohol/week: 0.0 standard drinks   Drug use: Yes    Types: Marijuana    Comment: almost daily for pain    Home Medications Prior to Admission medications   Medication Sig Start Date End Date Taking? Authorizing Provider  acetaminophen (TYLENOL) 500 MG tablet Take 1,000 mg by mouth every 6 (six) hours as needed for moderate pain.    [provider]  amiodarone (PACERONE) 200 MG tablet Take 1 tablet (200 mg total) by mouth daily. Patient not taking: Reported on 09/22/2020  07/26/20   Orson Eva, MD  atorvastatin (LIPITOR) 40 MG tablet Take 1 tablet (40 mg total) by mouth at bedtime. 05/21/20 09/22/20  [place] Eric J, DO  B-D UF III MINI PEN NEEDLES 31G X 5 MM MISC SMARTSIG:1 Each SUB-Q Daily 12/23/19   [provider]  Bacitracin-Polymyxin B (NEOSPORIN EX) Apply 1 application topically daily as needed (wound care).    [provider]  cefdinir (OMNICEF) 300 MG capsule Take 1 capsule (300 mg total) by mouth 2 (two) times daily. Patient not taking: Reported on 09/22/2020 07/26/20   Orson Eva, MD  donepezil (ARICEPT) 10 MG tablet Take 10 mg by mouth at bedtime.  06/28/16   [provider]  ferrous sulfate 325 (65  FE) MG tablet Take 325 mg by mouth daily.    [provider]  finasteride (PROSCAR) 5 MG tablet Take 5 mg by mouth daily. 08/23/20   [provider]  gabapentin (NEURONTIN) 100 MG capsule Take 100 mg by mouth at bedtime. 11/06/19   [provider]  HYDROcodone-acetaminophen (NORCO/VICODIN) 5-325 MG tablet Take 1 tablet by mouth at bedtime as needed for severe pain or moderate pain. Patient not taking: Reported on 09/22/2020 08/02/20   Evalee Jefferson, PA-C  LANTUS 100 UNIT/ML injection Inject 0.1 mLs (10 Units total) into the skin at bedtime as needed (High blood glucose). Patient taking differently: Inject 25 Units into the skin at bedtime. 06/30/20   Mercy Riding, MD  metoprolol tartrate (LOPRESSOR) 25 MG tablet Take 1 tablet (25 mg total) by mouth 2 (two) times daily. 06/30/20 06/30/21  Mercy Riding, MD  mirtazapine (REMERON) 7.5 MG tablet Take 7.5 mg by mouth at bedtime. 08/12/20   [provider]  nicotine (NICODERM CQ - DOSED IN MG/24 HOURS) 21 mg/24hr patch Place 1 patch (21 mg total) onto the skin daily as needed (Cessation of smoking). Patient not taking: No sig reported 06/30/20   Mercy Riding, MD  oxyCODONE-acetaminophen (PERCOCET) 5-325 MG tablet Take 1 tablet by mouth every 6 (six) hours as needed for severe  pain. Do not take with Acetaminophen (Tylenol) Patient not taking: No sig reported 07/01/20 07/01/21  Baglia, Corrina, PA-C  potassium chloride SA (KLOR-CON) 20 MEQ tablet Take 1 tablet (20 mEq total) by mouth daily for 3 doses. Patient not taking: No sig reported 08/15/20 09/22/20  Sponseller, Eugene Garnet R, PA-C  risperiDONE (RISPERDAL) 0.25 MG tablet Take 0.25 mg by mouth 2 (two) times daily.    [provider]  tamsulosin (FLOMAX) 0.4 MG CAPS capsule Take 1 capsule (0.4 mg total) by mouth daily. 07/26/20   Orson Eva, MD  traMADol (ULTRAM) 50 MG tablet Take 50 mg by mouth every 6 (six) hours as needed for moderate pain. 07/18/20   [provider]    Allergies    Bee venom, Codeine, Propoxyphene, and Valsartan  Review of Systems   Review of Systems  Unable to perform ROS: Mental status change  Constitutional:  Positive for fever.   Physical Exam Updated Vital Signs BP (!) 149/114   Temp (!) 102.1 F (38.9 C) (Oral)   Resp (!) 25   Ht '6\' 1"'$  (1.854 m)   BMI 12.94 kg/m   Physical Exam Vitals and nursing note reviewed.  Constitutional:      General: He is not in acute distress.    Appearance: He is well-developed. He is ill-appearing.     Comments: Hard of hearing, oriented to person and place  HENT:     Head: Normocephalic and atraumatic.     Mouth/Throat:     Pharynx: No oropharyngeal exudate.  Eyes:     Conjunctiva/sclera: Conjunctivae normal.     Pupils: Pupils are equal, round, and reactive to light.  Neck:     Comments: No meningismus. Cardiovascular:     Rate and Rhythm: Tachycardia present. Rhythm irregular.     Heart sounds: Normal heart sounds. No murmur heard. Pulmonary:     Effort: Pulmonary effort is normal. No respiratory distress.     Breath sounds: Normal breath sounds.  Chest:     Chest wall: No tenderness.  Abdominal:     Palpations: Abdomen is soft.     Tenderness: There is no abdominal tenderness. There is  no guarding or rebound.   Genitourinary:    Comments: Foley catheter in place draining clear urine Musculoskeletal:        General: No tenderness. Normal range of motion.     Cervical back: Normal range of motion and neck supple.     Comments: Left AKA well-healed.  Maceration and sloughing of skin of fourth and fifth toes on the right side.  No drainage.  Intact PT pulse with Doppler  Skin:    General: Skin is warm.  Neurological:     Mental Status: He is alert.     Cranial Nerves: No cranial nerve deficit.     Motor: No abnormal muscle tone.     Coordination: Coordination normal.     Comments: Follows commands, moves all extremities equally.   Psychiatric:        Behavior: Behavior normal.    ED Results / Procedures / Treatments   Labs (all labs ordered are listed, but only abnormal results are displayed) Labs Reviewed  CBC WITH DIFFERENTIAL/PLATELET - Abnormal; Notable for the following components:      Result Value   WBC 15.6 (*)    RBC 2.70 (*)    Hemoglobin 8.2 (*)    HCT 25.3 (*)    RDW 17.6 (*)    All other components within normal limits  PROTIME-INR - Abnormal; Notable for the following components:   Prothrombin Time 15.9 (*)    INR 1.3 (*)    All other components within normal limits  RESP PANEL BY RT-PCR (FLU A&B, COVID) ARPGX2  CULTURE, BLOOD (ROUTINE X 2)  CULTURE, BLOOD (ROUTINE X 2)  URINE CULTURE  APTT  LACTIC ACID, PLASMA  LACTIC ACID, PLASMA  COMPREHENSIVE METABOLIC PANEL  URINALYSIS, ROUTINE W REFLEX MICROSCOPIC    EKG None  Radiology No results found.  Procedures .Critical Care  Date/Time: 09/24/2020 6:57 AM Performed by: Ezequiel Essex, MD Authorized by: Ezequiel Essex, MD   Critical care provider statement:    Critical care time (minutes):  35   Critical care was necessary to treat or prevent imminent or life-threatening deterioration of the following conditions:  Sepsis   Critical care was time spent personally by me on the following activities:   Discussions with consultants, evaluation of patient's response to treatment, examination of patient, ordering and performing treatments and interventions, ordering and review of laboratory studies, ordering and review of radiographic studies, pulse oximetry, re-evaluation of patient's condition, obtaining history from patient or surrogate and review of old charts   Medications Ordered in ED Medications  lactated ringers infusion (has no administration in time range)  lactated ringers bolus 1,000 mL (has no administration in time range)  ceFEPIme (MAXIPIME) 2 g in sodium chloride 0.9 % 100 mL IVPB (has no administration in time range)  metroNIDAZOLE (FLAGYL) IVPB 500 mg (has no administration in time range)  vancomycin (VANCOCIN) IVPB 1000 mg/200 mL premix (has no administration in time range)  acetaminophen (TYLENOL) tablet 1,000 mg (has no administration in time range)    ED Course  I have reviewed the triage vital signs and the nursing notes.  Pertinent labs & imaging results that were available during my care of the patient were reviewed by me and considered in my medical decision making (see chart for details).    MDM Rules/Calculators/A&P                          Code sepsis activated on arrival.  Fever  and tachycardia with altered mental status.  Patient started on broad-spectrum antibiotics after cultures are obtained.  Patient febrile with A. fib with RVR, change in mental status.  Blood pressure is stable.  Source could be his indwelling Foley catheter, his wounds to his right leg, possible pneumonia, possible COVID.  Labs and imaging pending at time of shift change.  Patient will need admission for sepsis of unclear source. Blood pressure mental status remained stable.  Care transferred to Dr. Rogene Houston at shift change.    Allen Yu was evaluated in Emergency Department on 09/24/2020 for the symptoms described in the history of present illness. He was evaluated in the  context of the global COVID-19 pandemic, which necessitated consideration that the patient might be at risk for infection with the SARS-CoV-2 virus that causes COVID-19. Institutional protocols and algorithms that pertain to the evaluation of patients at risk for COVID-19 are in a state of rapid change based on information released by regulatory bodies including the CDC and federal and state organizations. These policies and algorithms were followed during the patient's care in the ED.  Final Clinical Impression(s) / ED Diagnoses Final diagnoses:  None    Rx / DC Orders ED Discharge Orders     None        Jasmynn Pfalzgraf, Annie Main, MD 09/24/20 [phone]

## 2020-09-24 NOTE — Progress Notes (Signed)
Pharmacy Antibiotic Note  Allen Yu a 75 y.o. male admitted on 09/24/2020 with sepsis.  Pharmacy has been consulted for vancomycin and cefepime dosing.  Plan: Vancomycin '750mg'$  IV every 24 hours.  Goal trough 15-20 mcg/mL. Cefepime 2gm IV every 12 hours.  Medical History: Past Medical History:  Diagnosis Date   Anemia    Arthritis    Carotid stenosis    Chronic back pain    Chronic kidney disease    Constipation    Dementia (HCC)    Diabetes mellitus    GERD (gastroesophageal reflux disease)    History of kidney stones    Hypertension    Lung nodule    PAF (paroxysmal atrial fibrillation) (HCC)    Peripheral neuropathy    Peripheral vascular disease (HCC)    PTSD (post-traumatic stress disorder)    Schizophrenia ([place])    Tuberculosis    Treated    Allergies:  Allergies  Allergen Reactions   Bee Venom Anaphylaxis   Codeine Other (See Comments)    incoherent  Other reaction(s): Delirium   Propoxyphene Other (See Comments)    Dizziness, "Makes me feel drunk" Other reaction(s): Dizziness   Valsartan Other (See Comments)    incoherent Other reaction(s): Delirium    Filed Weights   09/24/20 0643  Weight: 40.8 kg (90 lb)    CBC Latest Ref Rng & Units 09/24/2020 08/15/2020 07/26/2020  WBC 4.0 - 10.5 K/uL 15.6(H) 8.3 15.3(H)  Hemoglobin 13.0 - 17.0 g/dL 8.2(L) 6.7(LL) 7.5(L)  Hematocrit 39.0 - 52.0 % 25.3(L) 19.9(L) 22.6(L)  Platelets 150 - 400 K/uL 278 440(H) 81(L)     Estimated Creatinine Clearance: 34.3 mL/min (by C-G formula based on SCr of 1.09 mg/dL).  Antibiotics Given (last 72 hours)     Date/Time Action Medication Dose Rate   09/24/20 0659 New Bag/Given   ceFEPIme (MAXIPIME) 2 g in sodium chloride 0.9 % 100 mL IVPB 2 g 200 mL/hr       Antimicrobials this admission:  Cefepime 09/24/2020  >>  vancomycin 09/24/2020  >>  Metronidazole 09/24/2020   x 1   Microbiology results: 09/24/2020  BCx: sent 09/24/2020  UCx: sent 09/24/2020  Resp Panel: sent   09/24/2020  MRSA PCR: sent  Thank you for allowing pharmacy to be a part of this patient's care.  Thomasenia Sales, PharmD Clinical Pharmacist

## 2020-09-24 NOTE — Progress Notes (Signed)
CRITICAL VALUE-  **Lactic Acid 2.3 **Called at 1918 **MD Olevia Bowens Notified

## 2020-09-24 NOTE — ED Provider Notes (Signed)
Patient turned over to me.  Patient with sepsis presentation.  Not hypotensive.  Patient has 500 cc of fluid remaining in his bolus.  Did had elevated white blood cell count lactic acid initially three-point 0 repeat 3.3.  Still receiving fluid boluses.  Heart rate is now come down.  He was in atrial fibrillation with rapid rate.  And blood pressure is now down to the XX123456 systolic.  Overall seems to be improving.  Patient feels better.  Fevers under control.  Chest x-ray raise concerns for retrocardiac pneumonia.  Other possible sources urine.  Patient has long indwelling Foley catheter.  Urinalysis is now being collected.  COVID testing negative.  Patient also does have history of peripheral vascular disease.  And some dry gangrene to the toes of the right foot.  X-ray shows no evidence of osteomyelitis.  Not feel vascular surgery needs to get involved at this time.   Results for orders placed or performed during the hospital encounter of 09/24/20  Resp Panel by RT-PCR (Flu A&B, Covid) Nasopharyngeal Swab   Specimen: Nasopharyngeal Swab; Nasopharyngeal(NP) swabs in vial transport medium  Result Value Ref Range   SARS Coronavirus 2 by RT PCR NEGATIVE NEGATIVE   Influenza A by PCR NEGATIVE NEGATIVE   Influenza B by PCR NEGATIVE NEGATIVE  Lactic acid, plasma  Result Value Ref Range   Lactic Acid, Venous 3.0 (HH) 0.5 - 1.9 mmol/L  Lactic acid, plasma  Result Value Ref Range   Lactic Acid, Venous 3.3 (HH) 0.5 - 1.9 mmol/L  Comprehensive metabolic panel  Result Value Ref Range   Sodium 134 (L) 135 - 145 mmol/L   Potassium 3.3 (L) 3.5 - 5.1 mmol/L   Chloride 96 (L) 98 - 111 mmol/L   CO2 27 22 - 32 mmol/L   Glucose, Bld 255 (H) 70 - 99 mg/dL   BUN 14 8 - 23 mg/dL   Creatinine, Ser 1.09 0.61 - 1.24 mg/dL   Calcium 8.8 (L) 8.9 - 10.3 mg/dL   Total Protein 6.5 6.5 - 8.1 g/dL   Albumin 2.9 (L) 3.5 - 5.0 g/dL   AST 20 15 - 41 U/L   ALT 15 0 - 44 U/L   Alkaline Phosphatase 122 38 - 126 U/L    Total Bilirubin 1.3 (H) 0.3 - 1.2 mg/dL   GFR, Estimated >60 >60 mL/min   Anion gap 11 5 - 15  CBC WITH DIFFERENTIAL  Result Value Ref Range   WBC 15.6 (H) 4.0 - 10.5 K/uL   RBC 2.70 (L) 4.22 - 5.81 MIL/uL   Hemoglobin 8.2 (L) 13.0 - 17.0 g/dL   HCT 25.3 (L) 39.0 - 52.0 %   MCV 93.7 80.0 - 100.0 fL   MCH 30.4 26.0 - 34.0 pg   MCHC 32.4 30.0 - 36.0 g/dL   RDW 17.6 (H) 11.5 - 15.5 %   Platelets 278 150 - 400 K/uL   nRBC 0.0 0.0 - 0.2 %   Neutrophils Relative % 91 %   Neutro Abs 14.2 (H) 1.7 - 7.7 K/uL   Lymphocytes Relative 4 %   Lymphs Abs 0.6 (L) 0.7 - 4.0 K/uL   Monocytes Relative 0 %   Monocytes Absolute 0.0 (L) 0.1 - 1.0 K/uL   Eosinophils Relative 0 %   Eosinophils Absolute 0.0 0.0 - 0.5 K/uL   Basophils Relative 0 %   Basophils Absolute 0.0 0.0 - 0.1 K/uL   WBC Morphology DOHLE BODIES    Metamyelocytes Relative 5 %  Protime-INR  Result Value Ref Range   Prothrombin Time 15.9 (H) 11.4 - 15.2 seconds   INR 1.3 (H) 0.8 - 1.2  APTT  Result Value Ref Range   aPTT 28 24 - 36 seconds   DG Chest Port 1 View  Result Date: 09/24/2020 CLINICAL DATA:  Questionable sepsis, dementia EXAM: PORTABLE CHEST 1 VIEW COMPARISON:  07/21/2020 chest radiograph. FINDINGS: Stable cardiomediastinal silhouette with normal heart size. No pneumothorax. No pleural effusion. No pulmonary edema. Mild patchy left retrocardiac opacity. IMPRESSION: Mild patchy left retrocardiac opacity, which could represent atelectasis, aspiration, or pneumonia. Follow-up chest radiograph suggested. Electronically Signed   By: Ilona Sorrel M.D.   On: 09/24/2020 07:24   DG Foot 2 Views Right  Result Date: 09/24/2020 CLINICAL DATA:  sepsis, fever, right foot pain in blistering, no reported injury EXAM: RIGHT FOOT - 2 VIEW COMPARISON:  None FINDINGS: Vascular calcifications throughout the soft tissues. No fracture or dislocation. No focal osseous lesions. No cortical erosions or periosteal reaction. No radiopaque foreign  bodies. No significant arthropathy. IMPRESSION: No acute osseous abnormality. Vascular calcifications throughout the soft tissues, suggesting diabetes mellitus. Electronically Signed   By: Ilona Sorrel M.D.   On: 09/24/2020 07:27   VAS [place] ABI WITH/WO TBI  Result Date: 09/21/2020  LOWER EXTREMITY DOPPLER STUDY Patient Name:  Allen Yu  Date of Exam:   09/21/2020 Medical Rec #: [id number]       Accession #:    [id number] Date of Birth: [birth year]      Patient Gender: M Patient Age:   52 years Exam Location:  [facility] Procedure:      VAS [place] ABI WITH/WO TBI Referring Phys: Aldona Bar RHYNE --------------------------------------------------------------------------------  Indications: Rest pain, and peripheral artery disease. High Risk         Hypertension, Diabetes, current smoker, coronary artery Factors:          disease.  Vascular Interventions: 06/28/2020: Left above-knee amputation. Limitations: Today's exam was limited due to patient in wheelchair. Comparison Study: 05/18/2020: Rt ABI 0.85; Lt ABI [place] Performing Technologist: Ivan Croft  Examination Guidelines: A complete evaluation includes at minimum, Doppler waveform signals and systolic blood pressure reading at the level of bilateral brachial, anterior tibial, and posterior tibial arteries, when vessel segments are accessible. Bilateral testing is considered an integral part of a complete examination. Photoelectric Plethysmograph (PPG) waveforms and toe systolic pressure readings are included as required and additional duplex testing as needed. Limited examinations for reoccurring indications may be performed as noted.  ABI Findings: +---------+------------------+-----+----------+--------+ Right    Rt Pressure (mmHg)IndexWaveform  Comment  +---------+------------------+-----+----------+--------+ Brachial 154                                       +---------+------------------+-----+----------+--------+ PTA      184                1.17 biphasic           +---------+------------------+-----+----------+--------+ DP       173               1.10 monophasic         +---------+------------------+-----+----------+--------+ Great Toe52                0.33                    +---------+------------------+-----+----------+--------+ +--------+------------------+-----+--------+-------+ Left    Lt Pressure (  mmHg)IndexWaveformComment +--------+------------------+-----+--------+-------+ [id number]                                    +--------+------------------+-----+--------+-------+ +-------+-----------------+-----------+------------+------------+ ABI/TBIToday's ABI      Today's TBIPrevious ABIPrevious TBI +-------+-----------------+-----------+------------+------------+ Right  1.17 (unreliable)0.33       0.85        0.10         +-------+-----------------+-----------+------------+------------+ Left   AKA              AKA        [place]          0            +-------+-----------------+-----------+------------+------------+  Arterial wall calcification precludes accurate ankle pressures and ABIs.  Summary: Right: The right toe-brachial index is abnormal. Although ankle brachial indices are within normal limits (0.95-1.29), arterial Doppler waveforms at the ankle suggest some component of arterial occlusive disease.  *See table(s) above for measurements and observations.  Electronically signed by Servando Snare MD on 09/21/2020 at 1:53:09 PM.    Final       Fredia Sorrow, MD 09/24/20 [phone]

## 2020-09-25 DIAGNOSIS — R911 Solitary pulmonary nodule: Secondary | ICD-10-CM

## 2020-09-25 DIAGNOSIS — R6520 Severe sepsis without septic shock: Secondary | ICD-10-CM

## 2020-09-25 DIAGNOSIS — I480 Paroxysmal atrial fibrillation: Secondary | ICD-10-CM | POA: Diagnosis not present

## 2020-09-25 DIAGNOSIS — F203 Undifferentiated schizophrenia: Secondary | ICD-10-CM

## 2020-09-25 DIAGNOSIS — A419 Sepsis, unspecified organism: Principal | ICD-10-CM

## 2020-09-25 DIAGNOSIS — E0841 Diabetes mellitus due to underlying condition with diabetic mononeuropathy: Secondary | ICD-10-CM

## 2020-09-25 DIAGNOSIS — F0150 Vascular dementia without behavioral disturbance: Secondary | ICD-10-CM

## 2020-09-25 DIAGNOSIS — M5136 Other intervertebral disc degeneration, lumbar region: Secondary | ICD-10-CM | POA: Diagnosis not present

## 2020-09-25 LAB — CBC WITH DIFFERENTIAL/PLATELET
Abs Immature Granulocytes: 5.23 10*3/uL — ABNORMAL HIGH (ref 0.00–0.07)
Band Neutrophils: 10 %
Basophils Absolute: 0.1 10*3/uL (ref 0.0–0.1)
Basophils Absolute: 0 10*3/uL (ref 0.0–0.1)
Basophils Relative: 0 %
Basophils Relative: 0 %
Eosinophils Absolute: 0 10*3/uL (ref 0.0–0.5)
Eosinophils Absolute: 0 10*3/uL (ref 0.0–0.5)
Eosinophils Relative: 0 %
Eosinophils Relative: 0 %
HCT: 20.3 % — ABNORMAL LOW (ref 39.0–52.0)
HCT: 24.6 % — ABNORMAL LOW (ref 39.0–52.0)
Hemoglobin: 6.6 g/dL — CL (ref 13.0–17.0)
Hemoglobin: 8.1 g/dL — ABNORMAL LOW (ref 13.0–17.0)
Immature Granulocytes: 15 %
Lymphocytes Relative: 4 %
Lymphocytes Relative: 2 %
Lymphs Abs: 1.4 10*3/uL (ref 0.7–4.0)
Lymphs Abs: 0.6 10*3/uL — ABNORMAL LOW (ref 0.7–4.0)
MCH: 30.3 pg (ref 26.0–34.0)
MCH: 30.6 pg (ref 26.0–34.0)
MCHC: 32.5 g/dL (ref 30.0–36.0)
MCHC: 32.9 g/dL (ref 30.0–36.0)
MCV: 93.1 fL (ref 80.0–100.0)
MCV: 92.8 fL (ref 80.0–100.0)
Monocytes Absolute: 2.8 10*3/uL — ABNORMAL HIGH (ref 0.1–1.0)
Monocytes Absolute: 1.3 10*3/uL — ABNORMAL HIGH (ref 0.1–1.0)
Monocytes Relative: 8 %
Monocytes Relative: 4 %
Neutro Abs: 25 10*3/uL — ABNORMAL HIGH (ref 1.7–7.7)
Neutro Abs: 30.1 10*3/uL — ABNORMAL HIGH (ref 1.7–7.7)
Neutrophils Relative %: 73 %
Neutrophils Relative %: 84 %
Platelets: 242 10*3/uL (ref 150–400)
Platelets: 223 10*3/uL (ref 150–400)
RBC: 2.18 MIL/uL — ABNORMAL LOW (ref 4.22–5.81)
RBC: 2.65 MIL/uL — ABNORMAL LOW (ref 4.22–5.81)
RDW: 17.4 % — ABNORMAL HIGH (ref 11.5–15.5)
RDW: 17.2 % — ABNORMAL HIGH (ref 11.5–15.5)
WBC: 34.5 10*3/uL — ABNORMAL HIGH (ref 4.0–10.5)
WBC: 32 10*3/uL — ABNORMAL HIGH (ref 4.0–10.5)
nRBC: 0 % (ref 0.0–0.2)
nRBC: 0 % (ref 0.0–0.2)

## 2020-09-25 LAB — PROTIME-INR
INR: 1.6 — ABNORMAL HIGH (ref 0.8–1.2)
Prothrombin Time: 19.2 seconds — ABNORMAL HIGH (ref 11.4–15.2)

## 2020-09-25 LAB — PROCALCITONIN: Procalcitonin: 52.7 ng/mL

## 2020-09-25 LAB — GLUCOSE, CAPILLARY
Glucose-Capillary: 100 mg/dL — ABNORMAL HIGH (ref 70–99)
Glucose-Capillary: 100 mg/dL — ABNORMAL HIGH (ref 70–99)
Glucose-Capillary: 101 mg/dL — ABNORMAL HIGH (ref 70–99)
Glucose-Capillary: 114 mg/dL — ABNORMAL HIGH (ref 70–99)
Glucose-Capillary: 116 mg/dL — ABNORMAL HIGH (ref 70–99)
Glucose-Capillary: 31 mg/dL — CL (ref 70–99)
Glucose-Capillary: 66 mg/dL — ABNORMAL LOW (ref 70–99)
Glucose-Capillary: 70 mg/dL (ref 70–99)

## 2020-09-25 LAB — COMPREHENSIVE METABOLIC PANEL
ALT: 16 U/L (ref 0–44)
ALT: 16 U/L (ref 0–44)
AST: 26 U/L (ref 15–41)
AST: 25 U/L (ref 15–41)
Albumin: 2.4 g/dL — ABNORMAL LOW (ref 3.5–5.0)
Albumin: 2.5 g/dL — ABNORMAL LOW (ref 3.5–5.0)
Alkaline Phosphatase: 112 U/L (ref 38–126)
Alkaline Phosphatase: 112 U/L (ref 38–126)
Anion gap: 4 — ABNORMAL LOW (ref 5–15)
Anion gap: 8 (ref 5–15)
BUN: 17 mg/dL (ref 8–23)
BUN: 17 mg/dL (ref 8–23)
CO2: 28 mmol/L (ref 22–32)
CO2: 23 mmol/L (ref 22–32)
Calcium: 7.9 mg/dL — ABNORMAL LOW (ref 8.9–10.3)
Calcium: 7.8 mg/dL — ABNORMAL LOW (ref 8.9–10.3)
Chloride: 104 mmol/L (ref 98–111)
Chloride: 103 mmol/L (ref 98–111)
Creatinine, Ser: 0.98 mg/dL (ref 0.61–1.24)
Creatinine, Ser: 0.9 mg/dL (ref 0.61–1.24)
GFR, Estimated: >60 mL/min (ref >60)
GFR, Estimated: >60 mL/min (ref >60)
Glucose, Bld: 51 mg/dL — ABNORMAL LOW (ref 70–99)
Glucose, Bld: 105 mg/dL — ABNORMAL HIGH (ref 70–99)
Potassium: 2.8 mmol/L — ABNORMAL LOW (ref 3.5–5.1)
Potassium: 3.2 mmol/L — ABNORMAL LOW (ref 3.5–5.1)
Sodium: 136 mmol/L (ref 135–145)
Sodium: 134 mmol/L — ABNORMAL LOW (ref 135–145)
Total Bilirubin: 1.3 mg/dL — ABNORMAL HIGH (ref 0.3–1.2)
Total Bilirubin: 1.5 mg/dL — ABNORMAL HIGH (ref 0.3–1.2)
Total Protein: 5.6 g/dL — ABNORMAL LOW (ref 6.5–8.1)
Total Protein: 6 g/dL — ABNORMAL LOW (ref 6.5–8.1)

## 2020-09-25 LAB — MAGNESIUM
Magnesium: 1.3 mg/dL — ABNORMAL LOW (ref 1.7–2.4)
Magnesium: 1.8 mg/dL (ref 1.7–2.4)

## 2020-09-25 LAB — PHOSPHORUS
Phosphorus: 3.1 mg/dL (ref 2.5–4.6)
Phosphorus: 3.2 mg/dL (ref 2.5–4.6)

## 2020-09-25 LAB — PREPARE RBC (CROSSMATCH)

## 2020-09-25 LAB — CORTISOL-AM, BLOOD: Cortisol - AM: 27.9 ug/dL — ABNORMAL HIGH (ref 6.7–22.6)

## 2020-09-25 LAB — LACTIC ACID, PLASMA: Lactic Acid, Venous: 1.3 mmol/L (ref 0.5–1.9)

## 2020-09-25 MED ORDER — HALOPERIDOL LACTATE 5 MG/ML IJ SOLN
2.0000 mg | Freq: Once | INTRAMUSCULAR | Status: AC
  Administered 2020-09-25: 2 mg via INTRAVENOUS
  Filled 2020-09-25: qty 1

## 2020-09-25 MED ORDER — DEXTROSE-NACL 5-0.9 % IV SOLN: INTRAVENOUS | Status: DC

## 2020-09-25 MED ORDER — ENOXAPARIN SODIUM 40 MG/0.4ML IJ SOSY
40.0000 mg | PREFILLED_SYRINGE | INTRAMUSCULAR | Status: DC
  Administered 2020-09-25 – 2020-09-27 (×3): 40 mg via SUBCUTANEOUS
  Filled 2020-09-25 (×4): qty 0.4

## 2020-09-25 MED ORDER — MAGNESIUM SULFATE 2 GM/50ML IV SOLN
2.0000 g | Freq: Once | INTRAVENOUS | Status: AC
  Administered 2020-09-25: 2 g via INTRAVENOUS
  Filled 2020-09-25: qty 50

## 2020-09-25 MED ORDER — SODIUM CHLORIDE 0.9% IV SOLUTION: Freq: Once | INTRAVENOUS | Status: AC

## 2020-09-25 MED ORDER — POTASSIUM CHLORIDE 10 MEQ/100ML IV SOLN
10.0000 meq | INTRAVENOUS | Status: AC
  Administered 2020-09-25 (×3): 10 meq via INTRAVENOUS
  Filled 2020-09-25 (×3): qty 100

## 2020-09-25 MED ORDER — POTASSIUM CHLORIDE 10 MEQ/100ML IV SOLN
10.0000 meq | INTRAVENOUS | Status: AC
  Administered 2020-09-25 – 2020-09-26 (×6): 10 meq via INTRAVENOUS
  Filled 2020-09-25 (×7): qty 100

## 2020-09-25 MED ORDER — LORAZEPAM 2 MG/ML IJ SOLN
2.0000 mg | Freq: Once | INTRAMUSCULAR | Status: AC
  Administered 2020-09-25: 2 mg via INTRAVENOUS
  Filled 2020-09-25: qty 1

## 2020-09-25 MED ORDER — DEXTROSE 50 % IV SOLN
INTRAVENOUS | Status: AC
  Administered 2020-09-25: 50 mL
  Filled 2020-09-25: qty 50

## 2020-09-25 NOTE — Progress Notes (Signed)
[facility] night shift coverage note.  The nursing staff reported that the patient's hemoglobin has decreased from 8.2 to 6.6 g/dL.  Magnesium level was 1.3 mg/dL.  Potassium is 2.8 mmol/L.  Type and screen, 1 unit of PRBC, magnesium sulfate 2 g IVPB and KCl 10 mEq IVPB x3 ordered.  Tennis Must, MD.

## 2020-09-25 NOTE — Progress Notes (Addendum)
Date and time results received: 09/25/20 0520 (use smartphrase ".now" to insert current time)  Test: CBG Critical Value: 31   Orders Received? Or Actions Taken?:   1 Amp of dextrose given     CBG Recheck---- 100

## 2020-09-25 NOTE — Progress Notes (Signed)
PROGRESS NOTE    Allen Yu  [id number] DOB: 06-Apr-1945 DOA: 09/24/2020 PCP: [name], MD   Brief Narrative:  Allen Yu is a 75 y.o. BM from SNF PMHx PTSD, schizophrenia, dementia, PVD, s/p LEFT AKA, paroxysmal atrial fibrillation, essential HTN, lung nodule, nephrolithiasis, renal retention s/p indwelling Foley, DM type II with complication, DM neuropathy , nephrolithiasis, chronic back pain   Wife reports when she woke up this morning the patient was making" gulping noise" and seemed to have difficulty breathing.  She checked his temperature is 100.9.  On arrival his temperature is 102.5.  Wife reports no cough, runny nose or sore throat.  No vomiting or diarrhea.  He has had an indwelling Foley catheter since June but it was changed about 3 days ago. He has a known wound to his fourth and fifth toe on the right and is being scheduled for an aortogram by vascular surgery next week.  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   ADDENDUM; proceeded to bedside in ICU to check on patient's testicular mass patient very combative unable to get a straight answer from patient.  Patient stating why the hell am I here and I cannot do anything to help him.   Subjective: 9/4 afebrile overnight.  However per overnight MD patient decompensated 9/3 transfused 1 unit PRBC, magnesium 2 g, potassium 30 mEq given.   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)   S/P AKA (above knee amputation) unilateral, left (HCC)   Diabetic neuropathy (HCC)   DDD (degenerative disc disease), lumbar   Diabetes mellitus type 2, uncontrolled ([place])   AF (paroxysmal atrial fibrillation) ([place])   Schizophrenia ([place])   Dementia without behavioral disturbance (HCC)   Lung nodule   Indwelling Foley catheter present   PVD  (peripheral vascular disease) ([place])   Mass of testicle   Severe sepsis -Procalcitonin/lactic acid - Continue current antibiotics x7 days; will adjust antibiotics according to procalcitonin - 9/4 changed to D5-0.9% saline 132m/hr: Overnight hypoglycemia Results for WDERIS, POSTLE(MRN [id number] as of 09/25/2020 20:05  Ref. Range 09/24/2020 18:17 09/25/2020 01:57  Procalcitonin Latest Units: ng/mL 45.49 52.70     RIGHT mass of testicle - Ultrasound scrotum revealed epididymitis current antibiotics should cover infection. - If no improvement will contact urology.  Will monitor closely for urinary retention - Strict in and out   Essential HTN -Metoprolol 25 mg BID - We will hold all additional BP medication secondary to severe sepsis     Paroxysmal atrial fibrillation -See HTN  DM type II uncontrolled with complication - 9/4 moderate SSI (hold) secondary to hypotension overnight - See severe sepsis  DM foot ulcers -Chronic will not address at this time.   PVD  Chronic back pain/lumbar DDD -Tramadol 50 mg QID PRN -Remeron 7.5 mg daily   Schizophrenia -Risperidone 0.25 mg BID  Dementia without behavioral disturbance -Aiicept 10 mg daily-Remeron 7.5 mg nightly -9/4 patient DOES NOT have capacity to make medical decisions therefore CANNOT refuse diagnostic or therapeutic tests.  Hypokalemia - Potassium goal> - 9/4 potassium IV 60 mEq  Hypokalemia - Magnesium goal>2 - 9/4 magnesium IV 2 g   DVT prophylaxis: Lovenox Code Status: Full Family Communication:  Status is: Inpatient    Dispo: The patient is from: Home  Anticipated d/c is to: Home              Anticipated d/c date is: 3 days              Patient currently is not medically stable to d/c.      Consultants:    Procedures/Significant Events:  9/3 scrotal ultrasound: -Heterogeneous right epididymis with increased vascularity suspicious for epididymitis. -Heterogeneous appearance of the  intrascrotal fat in the right with possible fat containing inguinal hernia. This is not definite, and not well assessed by ultrasound. -Small to moderate right hydrocele. -Normal sonographic appearance of both testicles without intratesticular mass. 9/3 transfused 1 unit PRBC  I have personally reviewed and interpreted all radiology studies and my findings are as above.  VENTILATOR SETTINGS:    Cultures   Antimicrobials: Anti-infectives (From admission, onward)    Start     Dose/Rate Route Frequency Ordered Stop   09/25/20 0800  vancomycin (VANCOREADY) IVPB 750 mg/150 mL        750 mg 150 mL/hr over 60 Minutes Intravenous Every 24 hours 09/24/20 0740     09/24/20 1900  ceFEPIme (MAXIPIME) 2 g in sodium chloride 0.9 % 100 mL IVPB        2 g 200 mL/hr over 30 Minutes Intravenous Every 12 hours 09/24/20 0740     09/24/20 1100  vancomycin (VANCOCIN) IVPB 1000 mg/200 mL premix  Status:  Discontinued        1,000 mg 200 mL/hr over 60 Minutes Intravenous  Once 09/24/20 1057 09/24/20 1107   09/24/20 1100  ceFEPIme (MAXIPIME) 2 g in sodium chloride 0.9 % 100 mL IVPB  Status:  Discontinued        2 g 200 mL/hr over 30 Minutes Intravenous  Once 09/24/20 1057 09/24/20 1107   09/24/20 1100  azithromycin (ZITHROMAX) 500 mg in sodium chloride 0.9 % 250 mL IVPB        500 mg 250 mL/hr over 60 Minutes Intravenous Every 24 hours 09/24/20 1057 09/29/20 1059   09/24/20 0645  ceFEPIme (MAXIPIME) 2 g in sodium chloride 0.9 % 100 mL IVPB        2 g 200 mL/hr over 30 Minutes Intravenous  Once 09/24/20 0632 09/24/20 0743   09/24/20 0645  metroNIDAZOLE (FLAGYL) IVPB 500 mg        500 mg 100 mL/hr over 60 Minutes Intravenous  Once 09/24/20 0632 09/24/20 0907   09/24/20 0645  vancomycin (VANCOCIN) IVPB 1000 mg/200 mL premix        1,000 mg 200 mL/hr over 60 Minutes Intravenous  Once 09/24/20 [id number] 09/24/20 1030         Devices    LINES / TUBES:      Continuous Infusions:  0.9 % NaCl with  KCl 20 mEq / L 100 mL/hr at 09/24/20 1209   azithromycin 500 mg (09/24/20 1209)   ceFEPime (MAXIPIME) IV Stopped (09/24/20 1901)   vancomycin 750 mg (09/25/20 0734)     Objective: Vitals:   09/25/20 0621 09/25/20 0700 09/25/20 0800 09/25/20 0823  BP: 119/61 128/67 (!) 153/109 133/64  Pulse: 89   95  Resp: (!) 22 (!) 21 18 (!) 21  Temp: (!) 97 F (36.1 C)   (!) 97.2 F (36.2 C)  TempSrc:    Oral  SpO2:      Weight:      Height:        Intake/Output Summary (Last 24 hours) at 09/25/2020 [id number] Last data filed at  09/25/2020 0815 Gross per 24 hour  Intake 2932.69 ml  Output 1725 ml  Net 1207.69 ml   Filed Weights   09/24/20 0643 09/24/20 1252 09/25/20 0500  Weight: 40.8 kg 51.7 kg 53.3 kg    Examination:  General: Alert but combative, will follow some commands, No acute respiratory distress, cachectic Eyes: negative scleral hemorrhage, negative anisocoria, negative icterus ENT: Negative Runny nose, negative gingival bleeding, Neck:  Negative scars, masses, torticollis, lymphadenopathy, JVD Lungs: Clear to auscultation bilaterally without wheezes or crackles Cardiovascular: Regular rate and rhythm without murmur gallop or rub normal S1 and S2 Abdomen: negative abdominal pain, nondistended, positive soft, bowel sounds, no rebound, no ascites, no appreciable mass Extremities: No significant cyanosis, clubbing, or LEFT AKA well-healed GU; painful scrotum to palpation, enlarged Skin: Negative rashes, lesions, ulcers Psychiatric: Unable to evaluate patient combative, initially somnolent but when awoke Central nervous system:  Cranial nerves II through XII intact, tongue/uvula midline, all extremities muscle strength 5/5, sensation intact throughout, negative dysarthria, negative expressive aphasia, negative receptive aphasia.  Combative, uncooperative.  .     Data Reviewed: Care during the described time interval was provided by me .  I have reviewed this patient's available  data, including medical history, events of note, physical examination, and all test results as part of my evaluation.   CBC: Recent Labs  Lab 09/24/20 0640 09/25/20 0157  WBC 15.6* 34.5*  NEUTROABS 14.2* 25.0*  HGB 8.2* 6.6*  HCT 25.3* 20.3*  MCV 93.7 93.1  PLT 278 [id number]   Basic Metabolic Panel: Recent Labs  Lab 09/24/20 0640 09/25/20 0157  NA 134* 136  K 3.3* 2.8*  CL 96* 104  CO2 27 28  GLUCOSE 255* 51*  BUN 14 17  CREATININE 1.09 0.98  CALCIUM 8.8* 7.9*  MG  --  1.3*  PHOS  --  3.1   GFR: Estimated Creatinine Clearance: 49.9 mL/min (by C-G formula based on SCr of 0.98 mg/dL). Liver Function Tests: Recent Labs  Lab 09/24/20 0640 09/25/20 0157  AST 20 26  ALT 15 16  ALKPHOS 122 112  BILITOT 1.3* 1.3*  PROT 6.5 5.6*  ALBUMIN 2.9* 2.4*   No results for input(s): LIPASE, AMYLASE in the last 168 hours. No results for input(s): AMMONIA in the last 168 hours. Coagulation Profile: Recent Labs  Lab 09/24/20 0640 09/25/20 0157  INR 1.3* 1.6*   Cardiac Enzymes: No results for input(s): CKTOTAL, CKMB, CKMBINDEX, TROPONINI in the last 168 hours. BNP (last 3 results) No results for input(s): PROBNP in the last 8760 hours. HbA1C: No results for input(s): HGBA1C in the last 72 hours. CBG: Recent Labs  Lab 09/24/20 2252 09/25/20 0512 09/25/20 0553 09/25/20 0738 09/25/20 0812  GLUCAP 68* 31* 100* 66* 101*   Lipid Profile: No results for input(s): CHOL, HDL, LDLCALC, TRIG, CHOLHDL, LDLDIRECT in the last 72 hours. Thyroid Function Tests: No results for input(s): TSH, T4TOTAL, FREET4, T3FREE, THYROIDAB in the last 72 hours. Anemia Panel: No results for input(s): VITAMINB12, FOLATE, FERRITIN, TIBC, IRON, RETICCTPCT in the last 72 hours. Urine analysis:    Component Value Date/Time   COLORURINE YELLOW 09/24/2020 0632   APPEARANCEUR CLEAR 09/24/2020 0632   LABSPEC 1.015 09/24/2020 0632   PHURINE 7.0 09/24/2020 0632   GLUCOSEU 250 (A) 09/24/2020 0632   HGBUR  MODERATE (A) 09/24/2020 0632   BILIRUBINUR NEGATIVE 09/24/2020 0632   KETONESUR NEGATIVE 09/24/2020 0632   PROTEINUR 100 (A) 09/24/2020 0632   UROBILINOGEN 0.2 02/18/2014 0950   NITRITE NEGATIVE 09/24/2020  Central (A) 09/24/2020 0632   Sepsis Labs: '@LABRCNTIP'$ (procalcitonin:4,lacticidven:4)  ) Recent Results (from the past 240 hour(s))  Urine Culture     Status: Abnormal (Preliminary result)   Collection Time: 09/24/20  6:32 AM   Specimen: Urine, Clean Catch  Result Value Ref Range Status   Specimen Description   Final    URINE, CLEAN CATCH Performed at [facility], [address] [place], [place] [address]    Special Requests   Final    NONE Performed at [facility], [address], [place], [place] [address]    Culture (A)  Final    >=100,000 COLONIES/mL GRAM NEGATIVE RODS CULTURE REINCUBATED FOR BETTER GROWTH Performed at [facility], [place] [address], [place], [place] [address]    Report Status PENDING  Incomplete  Resp Panel by RT-PCR (Flu A&B, Covid) Nasopharyngeal Swab     Status: None   Collection Time: 09/24/20  6:41 AM   Specimen: Nasopharyngeal Swab; Nasopharyngeal(NP) swabs in vial transport medium  Result Value Ref Range Status   SARS Coronavirus 2 by RT PCR NEGATIVE NEGATIVE Final    Comment: (NOTE) SARS-CoV-2 target nucleic acids are NOT DETECTED.  The SARS-CoV-2 RNA is generally detectable in upper respiratory specimens during the acute phase of infection. The lowest concentration of SARS-CoV-2 viral copies this assay can detect is 138 copies/mL. A negative result does not preclude SARS-Cov-2 infection and should not be used as the sole basis for treatment or other patient management decisions. A negative result may occur with  improper specimen collection/handling, submission of specimen other than nasopharyngeal swab, presence of viral mutation(s) within the areas targeted by this assay, and inadequate number of  viral copies(<138 copies/mL). A negative result must be combined with clinical observations, patient history, and epidemiological information. The expected result is Negative.  Fact Sheet for Patients:  EntrepreneurPulse.com.au  Fact Sheet for Healthcare Providers:  IncredibleEmployment.be  This test is no t yet approved or cleared by the [place] FDA and  has been authorized for detection and/or diagnosis of SARS-CoV-2 by FDA under an Emergency Use Authorization (EUA). This EUA will remain  in effect (meaning this test can be used) for the duration of the COVID-19 declaration under Section 564(b)(1) of the Act, 21 U.S.C.section 360bbb-3(b)(1), unless the authorization is terminated  or revoked sooner.       Influenza A by PCR NEGATIVE NEGATIVE Final   Influenza B by PCR NEGATIVE NEGATIVE Final    Comment: (NOTE) The Xpert Xpress SARS-CoV-2/FLU/RSV plus assay is intended as an aid in the diagnosis of influenza from Nasopharyngeal swab specimens and should not be used as a sole basis for treatment. Nasal washings and aspirates are unacceptable for Xpert Xpress SARS-CoV-2/FLU/RSV testing.  Fact Sheet for Patients: EntrepreneurPulse.com.au  Fact Sheet for Healthcare Providers: IncredibleEmployment.be  This test is not yet approved or cleared by the [place] FDA and has been authorized for detection and/or diagnosis of SARS-CoV-2 by FDA under an Emergency Use Authorization (EUA). This EUA will remain in effect (meaning this test can be used) for the duration of the COVID-19 declaration under Section 564(b)(1) of the Act, 21 U.S.C. section 360bbb-3(b)(1), unless the authorization is terminated or revoked.  Performed at [facility], [address] [place],  [address]   MRSA Next Gen by PCR, Nasal     Status: None   Collection Time: 09/24/20 12:45 PM   Specimen: Nasal Mucosa; Nasal Swab   Result Value Ref Range Status  MRSA by PCR Next Gen NOT DETECTED NOT DETECTED Final    Comment: (NOTE) The GeneXpert MRSA Assay (FDA approved for NASAL specimens only), is one component of a comprehensive MRSA colonization surveillance program. It is not intended to diagnose MRSA infection nor to guide or monitor treatment for MRSA infections. Test performance is not FDA approved in patients less than 8 years old. Performed at [facility], [address] [place], [place] [address]          Radiology Studies: [facility] Chest [facility] 1 View  Result Date: 09/24/2020 CLINICAL DATA:  Questionable sepsis, dementia EXAM: PORTABLE CHEST 1 VIEW COMPARISON:  07/21/2020 chest radiograph. FINDINGS: Stable cardiomediastinal silhouette with normal heart size. No pneumothorax. No pleural effusion. No pulmonary edema. Mild patchy left retrocardiac opacity. IMPRESSION: Mild patchy left retrocardiac opacity, which could represent atelectasis, aspiration, or pneumonia. Follow-up chest radiograph suggested. Electronically Signed   By: Ilona Sorrel M.D.   On: 09/24/2020 07:24   DG Foot 2 Views Right  Result Date: 09/24/2020 CLINICAL DATA:  sepsis, fever, right foot pain in blistering, no reported injury EXAM: RIGHT FOOT - 2 VIEW COMPARISON:  None FINDINGS: Vascular calcifications throughout the soft tissues. No fracture or dislocation. No focal osseous lesions. No cortical erosions or periosteal reaction. No radiopaque foreign bodies. No significant arthropathy. IMPRESSION: No acute osseous abnormality. Vascular calcifications throughout the soft tissues, suggesting diabetes mellitus. Electronically Signed   By: Ilona Sorrel M.D.   On: 09/24/2020 07:27   US SCROTUM W/DOPPLER  Result Date: 09/24/2020 CLINICAL DATA:  Mass of testicle. EXAM: SCROTAL ULTRASOUND DOPPLER ULTRASOUND OF THE TESTICLES TECHNIQUE: Complete ultrasound examination of the testicles, epididymis, and other scrotal structures was performed. Color and  spectral Doppler ultrasound were also utilized to evaluate blood flow to the testicles. COMPARISON:  Abdominopelvic CT 07/21/2020 FINDINGS: Right testicle Measurements: 4.3 x 2.8 x 3.2 cm. Homogeneous echogenicity. Normal blood flow. No testicular mass or microlithiasis visualized. Left testicle Measurements: 3.3 x 2.2 x 3.3 cm. Homogeneous echogenicity. Normal blood flow. No mass or microlithiasis visualized. Right epididymis: Heterogeneous and hyperemic. No focal lesion or fluid collection. Left epididymis:  Normal in size and appearance. Hydrocele:  Small to moderate on the right. Varicocele:  None visualized. Pulsed Doppler interrogation of both testes demonstrates normal low resistance arterial and venous waveforms bilaterally. Other: Heterogeneity of the intrascrotal fat on the right. There is a possible fat containing right inguinal hernia, not well-defined by ultrasound. IMPRESSION: 1. Heterogeneous right epididymis with increased vascularity suspicious for epididymitis. 2. Heterogeneous appearance of the intrascrotal fat in the right with possible fat containing inguinal hernia. This is not definite, and not well assessed by ultrasound. 3. Small to moderate right hydrocele. 4. Normal sonographic appearance of both testicles without intratesticular mass. Electronically Signed   By: Keith Rake M.D.   On: 09/24/2020 17:18        Scheduled Meds:  Chlorhexidine Gluconate Cloth  6 each Topical Daily   donepezil  10 mg Oral QHS   enoxaparin (LOVENOX) injection  30 mg Subcutaneous Q24H   feeding supplement  237 mL Oral BID BM   ferrous sulfate  325 mg Oral Daily   finasteride  5 mg Oral Daily   folic acid  1 mg Intravenous Daily   gabapentin  100 mg Oral QHS   insulin aspart  0-15 Units Subcutaneous Q4H   metoprolol tartrate  25 mg Oral BID   mirtazapine  7.5 mg Oral QHS   risperiDONE  0.25 mg Oral BID  tamsulosin  0.4 mg Oral Daily   thiamine injection  100 mg Intravenous Daily    Continuous Infusions:  0.9 % NaCl with KCl 20 mEq / L 100 mL/hr at 09/24/20 1209   azithromycin 500 mg (09/24/20 1209)   ceFEPime (MAXIPIME) IV Stopped (09/24/20 1901)   vancomycin 750 mg (09/25/20 0734)     LOS: 1 day   The patient is critically ill with multiple organ systems failure and requires high complexity decision making for assessment and support, frequent evaluation and titration of therapies, application of advanced monitoring technologies and extensive interpretation of multiple databases. Critical Care Time devoted to patient care services described in this note  Time spent: 40 minutes     Renella Steig, Geraldo Docker, MD [facility]   If 7PM-7AM, please contact night-coverage 09/25/2020, 9:11 AM

## 2020-09-26 DIAGNOSIS — M5136 Other intervertebral disc degeneration, lumbar region: Secondary | ICD-10-CM | POA: Diagnosis not present

## 2020-09-26 DIAGNOSIS — I480 Paroxysmal atrial fibrillation: Secondary | ICD-10-CM | POA: Diagnosis not present

## 2020-09-26 DIAGNOSIS — A419 Sepsis, unspecified organism: Secondary | ICD-10-CM | POA: Diagnosis not present

## 2020-09-26 DIAGNOSIS — F0150 Vascular dementia without behavioral disturbance: Secondary | ICD-10-CM | POA: Diagnosis not present

## 2020-09-26 LAB — CBC WITH DIFFERENTIAL/PLATELET
Abs Immature Granulocytes: 1.11 10*3/uL — ABNORMAL HIGH (ref 0.00–0.07)
Basophils Absolute: 0.1 10*3/uL (ref 0.0–0.1)
Basophils Relative: 0 %
Eosinophils Absolute: 0 10*3/uL (ref 0.0–0.5)
Eosinophils Relative: 0 %
HCT: 24.6 % — ABNORMAL LOW (ref 39.0–52.0)
Hemoglobin: 8.1 g/dL — ABNORMAL LOW (ref 13.0–17.0)
Immature Granulocytes: 3 %
Lymphocytes Relative: 3 %
Lymphs Abs: 1.1 10*3/uL (ref 0.7–4.0)
MCH: 30.2 pg (ref 26.0–34.0)
MCHC: 32.9 g/dL (ref 30.0–36.0)
MCV: 91.8 fL (ref 80.0–100.0)
Monocytes Absolute: 0.9 10*3/uL (ref 0.1–1.0)
Monocytes Relative: 3 %
Neutro Abs: 29.5 10*3/uL — ABNORMAL HIGH (ref 1.7–7.7)
Neutrophils Relative %: 91 %
Platelets: 266 10*3/uL (ref 150–400)
RBC: 2.68 MIL/uL — ABNORMAL LOW (ref 4.22–5.81)
RDW: 17.2 % — ABNORMAL HIGH (ref 11.5–15.5)
WBC: 32.7 10*3/uL — ABNORMAL HIGH (ref 4.0–10.5)
nRBC: 0 % (ref 0.0–0.2)

## 2020-09-26 LAB — URINE CULTURE: Culture: >=100000 — AB

## 2020-09-26 LAB — GLUCOSE, CAPILLARY
Glucose-Capillary: 107 mg/dL — ABNORMAL HIGH (ref 70–99)
Glucose-Capillary: 132 mg/dL — ABNORMAL HIGH (ref 70–99)
Glucose-Capillary: 144 mg/dL — ABNORMAL HIGH (ref 70–99)
Glucose-Capillary: 182 mg/dL — ABNORMAL HIGH (ref 70–99)
Glucose-Capillary: 161 mg/dL — ABNORMAL HIGH (ref 70–99)

## 2020-09-26 LAB — TYPE AND SCREEN
ABO/RH(D): O POS
Antibody Screen: NEGATIVE
Unit division: 0

## 2020-09-26 LAB — COMPREHENSIVE METABOLIC PANEL
ALT: 15 U/L (ref 0–44)
AST: 20 U/L (ref 15–41)
Albumin: 2.3 g/dL — ABNORMAL LOW (ref 3.5–5.0)
Alkaline Phosphatase: 128 U/L — ABNORMAL HIGH (ref 38–126)
Anion gap: 5 (ref 5–15)
BUN: 17 mg/dL (ref 8–23)
CO2: 25 mmol/L (ref 22–32)
Calcium: 8.2 mg/dL — ABNORMAL LOW (ref 8.9–10.3)
Chloride: 107 mmol/L (ref 98–111)
Creatinine, Ser: 0.93 mg/dL (ref 0.61–1.24)
GFR, Estimated: >60 mL/min (ref >60)
Glucose, Bld: 104 mg/dL — ABNORMAL HIGH (ref 70–99)
Potassium: 3.2 mmol/L — ABNORMAL LOW (ref 3.5–5.1)
Sodium: 137 mmol/L (ref 135–145)
Total Bilirubin: 1.1 mg/dL (ref 0.3–1.2)
Total Protein: 5.9 g/dL — ABNORMAL LOW (ref 6.5–8.1)

## 2020-09-26 LAB — PHOSPHORUS: Phosphorus: 2.7 mg/dL (ref 2.5–4.6)

## 2020-09-26 LAB — BPAM RBC
Blood Product Expiration Date: 202210062359
ISSUE DATE / TIME: 202209040529
Unit Type and Rh: 5100

## 2020-09-26 LAB — PROCALCITONIN: Procalcitonin: 41.5 ng/mL

## 2020-09-26 LAB — MAGNESIUM: Magnesium: 1.9 mg/dL (ref 1.7–2.4)

## 2020-09-26 MED ORDER — METOPROLOL TARTRATE 5 MG/5ML IV SOLN
2.5000 mg | INTRAVENOUS | Status: DC | PRN
  Administered 2020-09-26 – 2020-09-27 (×4): 2.5 mg via INTRAVENOUS
  Filled 2020-09-26 (×4): qty 5

## 2020-09-26 MED ORDER — LORAZEPAM 2 MG/ML IJ SOLN
1.0000 mg | Freq: Once | INTRAMUSCULAR | Status: AC
  Administered 2020-09-26: 1 mg via INTRAVENOUS
  Filled 2020-09-26: qty 1

## 2020-09-26 MED ORDER — LEVOFLOXACIN IN D5W 500 MG/100ML IV SOLN: 500.0000 mg | INTRAVENOUS | Status: DC

## 2020-09-26 MED ORDER — HALOPERIDOL LACTATE 5 MG/ML IJ SOLN
4.0000 mg | Freq: Once | INTRAMUSCULAR | Status: AC
  Administered 2020-09-26: 4 mg via INTRAVENOUS
  Filled 2020-09-26: qty 1

## 2020-09-26 MED ORDER — OCUVITE-LUTEIN PO CAPS
1.0000 | ORAL_CAPSULE | Freq: Every day | ORAL | Status: DC
  Administered 2020-09-27 – 2020-09-29 (×3): 1 via ORAL
  Filled 2020-09-26 (×3): qty 1

## 2020-09-26 MED ORDER — SODIUM CHLORIDE 0.9 % IV SOLN
100.0000 mg | Freq: Two times a day (BID) | INTRAVENOUS | Status: DC
  Administered 2020-09-26 – 2020-09-28 (×4): 100 mg via INTRAVENOUS
  Filled 2020-09-26 (×7): qty 100

## 2020-09-26 MED ORDER — HALOPERIDOL LACTATE 5 MG/ML IJ SOLN
1.0000 mg | Freq: Three times a day (TID) | INTRAMUSCULAR | Status: DC
  Administered 2020-09-26 – 2020-09-27 (×5): 1 mg via INTRAVENOUS
  Filled 2020-09-26 (×5): qty 1

## 2020-09-26 MED ORDER — MAGNESIUM SULFATE 2 GM/50ML IV SOLN
2.0000 g | Freq: Once | INTRAVENOUS | Status: AC
  Administered 2020-09-26: 2 g via INTRAVENOUS
  Filled 2020-09-26: qty 50

## 2020-09-26 MED ORDER — LEVOFLOXACIN IN D5W 750 MG/150ML IV SOLN
750.0000 mg | INTRAVENOUS | Status: DC
  Administered 2020-09-26 – 2020-09-27 (×2): 750 mg via INTRAVENOUS
  Filled 2020-09-26 (×2): qty 150

## 2020-09-26 MED ORDER — POTASSIUM CHLORIDE 10 MEQ/100ML IV SOLN
10.0000 meq | INTRAVENOUS | Status: AC
Start: 2020-09-26 — End: 2020-09-27
  Administered 2020-09-26 (×6): 10 meq via INTRAVENOUS
  Filled 2020-09-26: qty 100

## 2020-09-26 NOTE — Progress Notes (Signed)
PROGRESS NOTE    Allen Yu  [id number] DOB: 02/10/1945 DOA: 09/24/2020 PCP: [name], MD   Brief Narrative:  Allen Yu is a 75 y.o. BM from SNF PMHx PTSD, schizophrenia, dementia, PVD, s/p LEFT AKA, paroxysmal atrial fibrillation, essential HTN, lung nodule, nephrolithiasis, renal retention s/p indwelling Foley, DM type II with complication, DM neuropathy , nephrolithiasis, chronic back pain   Wife reports when she woke up this morning the patient was making" gulping noise" and seemed to have difficulty breathing.  She checked his temperature is 100.9.  On arrival his temperature is 102.5.  Wife reports no cough, runny nose or sore throat.  No vomiting or diarrhea.  He has had an indwelling Foley catheter since June but it was changed about 3 days ago. He has a known wound to his fourth and fifth toe on the right and is being scheduled for an aortogram by vascular surgery next week.  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   ADDENDUM; proceeded to bedside in ICU to check on patient's testicular mass patient very combative unable to get a straight answer from patient.  Patient stating why the hell am I here and I cannot do anything to help him.   Subjective: 9/5 afebrile overnight alert however mumbles unintelligibly does not follow commands.   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 (HCC)   S/P AKA (above knee amputation) unilateral, left (HCC)   Diabetic neuropathy (HCC)   DDD (degenerative disc disease), lumbar   Diabetes mellitus type 2, uncontrolled ([place])   AF (paroxysmal atrial fibrillation) (HCC)   Schizophrenia ([place])   Dementia without behavioral disturbance (HCC)   Lung nodule   Indwelling Foley catheter present   PVD (peripheral vascular disease) (HCC)   Mass of  testicle   Severe sepsis -Procalcitonin/lactic acid - Continue current antibiotics x7 days; will adjust antibiotics according to procalcitonin - 9/4 changed to D5-0.9% saline 168m/hr: Overnight hypoglycemia Lab Results  Component Value Date   WBC 32.7 (H) 09/26/2020   WBC 32.0 (H) 09/25/2020   WBC 34.5 (H) 09/25/2020   WBC 15.6 (H) 09/24/2020   WBC 8.3 08/15/2020  Results for WLATROY, CANTAVE(MRN [id number] as of 09/26/2020 09:06  Ref. Range 09/24/2020 18:17 09/25/2020 01:57 09/26/2020 04:08  Procalcitonin Latest Units: ng/mL 45.49 52.70 41.50     RIGHT mass of testicle - Ultrasound scrotum revealed epididymitis current antibiotics should cover infection. - If no improvement will contact urology.  Will monitor closely for urinary retention - Strict in and out -9/5 Start levofloxacin 500 mg daily x10 days   Essential HTN -Metoprolol 25 mg BID -We will hold all additional BP medication secondary to severe sepsis    Paroxysmal atrial fibrillation -See HTN  DM type II uncontrolled with complication - 9/4 moderate SSI (hold) secondary to hypotension overnight - See severe sepsis  DM foot ulcers -Chronic will not address at this time.   PVD  Chronic back pain/lumbar DDD -Tramadol 50 mg QID PRN -Remeron 7.5 mg daily   Schizophrenia -Risperidone 0.25 mg BID  Dementia without behavioral disturbance -Aiicept 10 mg daily-Remeron 7.5 mg nightly -9/4 patient DOES NOT have capacity to make medical decisions therefore CANNOT refuse diagnostic or therapeutic tests.  Hypokalemia - Potassium goal> - 9/5 potassium IV 60 mEq  Hypokalemia - Magnesium goal>2 -  9/5 magnesium IV 2 g  Goals of care - 9/5 Palliative Care Consult: Patient frequent flyer, demented, severe sepsis good chance may not survive this hospitalization.  Evaluate for change of CODE STATUS to DNR.  Home with hospice vs residential hospice   DVT prophylaxis: Lovenox Code Status: Full Family Communication:   Status is: Inpatient    Dispo: The patient is from: Home              Anticipated d/c is to: Home              Anticipated d/c date is: 3 days              Patient currently is not medically stable to d/c.      Consultants:    Procedures/Significant Events:  9/3 scrotal ultrasound: -Heterogeneous right epididymis with increased vascularity suspicious for epididymitis. -Heterogeneous appearance of the intrascrotal fat in the right with possible fat containing inguinal hernia. This is not definite, and not well assessed by ultrasound. -Small to moderate right hydrocele. -Normal sonographic appearance of both testicles without intratesticular mass. 9/3 transfused 1 unit PRBC  I have personally reviewed and interpreted all radiology studies and my findings are as above.  VENTILATOR SETTINGS:    Cultures   Antimicrobials: Anti-infectives (From admission, onward)    Start     Ordered Stop   09/25/20 0800  vancomycin (VANCOREADY) IVPB 750 mg/150 mL        09/24/20 0740     09/24/20 1900  ceFEPIme (MAXIPIME) 2 g in sodium chloride 0.9 % 100 mL IVPB        09/24/20 0740     09/24/20 1100  vancomycin (VANCOCIN) IVPB 1000 mg/200 mL premix  Status:  Discontinued        09/24/20 1057 09/24/20 1107   09/24/20 1100  ceFEPIme (MAXIPIME) 2 g in sodium chloride 0.9 % 100 mL IVPB  Status:  Discontinued        09/24/20 1057 09/24/20 1107   09/24/20 1100  azithromycin (ZITHROMAX) 500 mg in sodium chloride 0.9 % 250 mL IVPB        09/24/20 1057 09/29/20 1059   09/24/20 0645  ceFEPIme (MAXIPIME) 2 g in sodium chloride 0.9 % 100 mL IVPB        09/24/20 0632 09/24/20 0743   09/24/20 0645  metroNIDAZOLE (FLAGYL) IVPB 500 mg        09/24/20 [id number] 09/24/20 0907   09/24/20 0645  vancomycin (VANCOCIN) IVPB 1000 mg/200 mL premix        09/24/20 [id number] 09/24/20 1030         Devices    LINES / TUBES:      Continuous Infusions:  azithromycin 500 mg (09/25/20 1231)   ceFEPime  (MAXIPIME) IV Stopped (09/26/20 0125)   dextrose 5 % and 0.9% NaCl 50 mL/hr at 09/26/20 0730   vancomycin 750 mg (09/26/20 0815)     Objective: Vitals:   09/26/20 0300 09/26/20 0411 09/26/20 0416 09/26/20 0708  BP: (!) 143/53     Pulse:      Resp: (!) 23   19  Temp:  (!) 97 F (36.1 C)  97.6 F (36.4 C)  TempSrc:  Axillary  Axillary  SpO2:      Weight:   54.8 kg   Height:        Intake/Output Summary (Last 24 hours) at 09/26/2020 0905 Last data filed at 09/26/2020 0730 Gross per 24 hour  Intake 2379.62 ml  Output  1150 ml  Net 1229.62 ml    Filed Weights   09/24/20 1252 09/25/20 0500 09/26/20 0416  Weight: 51.7 kg 53.3 kg 54.8 kg   Physical Exam:  General: Somnolent, does not respond to commands, No acute respiratory distress, cachectic Eyes: negative scleral hemorrhage, negative anisocoria, negative icterus ENT: Negative Runny nose, negative gingival bleeding, Neck:  Negative scars, masses, torticollis, lymphadenopathy, JVD Lungs: Clear to auscultation bilaterally without wheezes or crackles Cardiovascular: Regular rate and rhythm without murmur gallop or rub normal S1 and S2 Abdomen: negative abdominal pain, nondistended, positive soft, bowel sounds, no rebound, no ascites, no appreciable mass Extremities: No significant cyanosis, clubbing, or LEFT AKA well-healed Skin: Negative rashes, lesions, ulcers Psychiatric: Unable to evaluate, somnolent Central nervous system: Unable to evaluate, patient spontaneously moves all extremities  .     Data Reviewed: Care during the described time interval was provided by me .  I have reviewed this patient's available data, including medical history, events of note, physical examination, and all test results as part of my evaluation.   CBC: Recent Labs  Lab 09/24/20 0640 09/25/20 0157 09/25/20 1341 09/26/20 0408  WBC 15.6* 34.5* 32.0* 32.7*  NEUTROABS 14.2* 25.0* 30.1* 29.5*  HGB 8.2* 6.6* 8.1* 8.1*  HCT 25.3* 20.3* 24.6*  24.6*  MCV 93.7 93.1 92.8 91.8  PLT 278 242 [id number]    Basic Metabolic Panel: Recent Labs  Lab 09/24/20 0640 09/25/20 0157 09/25/20 1341 09/26/20 0408  NA 134* 136 134* 137  K 3.3* 2.8* 3.2* 3.2*  CL 96* 104 103 107  CO2 '27 28 23 25  '$ GLUCOSE 255* 51* 105* 104*  BUN '14 17 17 17  '$ CREATININE 1.09 0.98 0.90 0.93  CALCIUM 8.8* 7.9* 7.8* 8.2*  MG  --  1.3* 1.8 1.9  PHOS  --  3.1 3.2 2.7    GFR: Estimated Creatinine Clearance: 54 mL/min (by C-G formula based on SCr of 0.93 mg/dL). Liver Function Tests: Recent Labs  Lab 09/24/20 0640 09/25/20 0157 09/25/20 1341 09/26/20 0408  AST '20 26 25 20  '$ ALT '15 16 16 15  '$ ALKPHOS 122 112 112 128*  BILITOT 1.3* 1.3* 1.5* 1.1  PROT 6.5 5.6* 6.0* 5.9*  ALBUMIN 2.9* 2.4* 2.5* 2.3*    No results for input(s): LIPASE, AMYLASE in the last 168 hours. No results for input(s): AMMONIA in the last 168 hours. Coagulation Profile: Recent Labs  Lab 09/24/20 0640 09/25/20 0157  INR 1.3* 1.6*    Cardiac Enzymes: No results for input(s): CKTOTAL, CKMB, CKMBINDEX, TROPONINI in the last 168 hours. BNP (last 3 results) No results for input(s): PROBNP in the last 8760 hours. HbA1C: No results for input(s): HGBA1C in the last 72 hours. CBG: Recent Labs  Lab 09/25/20 1733 09/25/20 1930 09/25/20 2345 09/26/20 0411 09/26/20 0711  GLUCAP 70 116* 100* 107* 132*    Lipid Profile: No results for input(s): CHOL, HDL, LDLCALC, TRIG, CHOLHDL, LDLDIRECT in the last 72 hours. Thyroid Function Tests: No results for input(s): TSH, T4TOTAL, FREET4, T3FREE, THYROIDAB in the last 72 hours. Anemia Panel: No results for input(s): VITAMINB12, FOLATE, FERRITIN, TIBC, IRON, RETICCTPCT in the last 72 hours. Urine analysis:    Component Value Date/Time   COLORURINE YELLOW 09/24/2020 [place] 09/24/2020 0632   LABSPEC 1.015 09/24/2020 0632   PHURINE 7.0 09/24/2020 0632   GLUCOSEU 250 (A) 09/24/2020 0632   HGBUR MODERATE (A) 09/24/2020  [place] NEGATIVE 09/24/2020 [place] 09/24/2020 [id number]  PROTEINUR 100 (A) 09/24/2020 0632   UROBILINOGEN 0.2 02/18/2014 0950   NITRITE NEGATIVE 09/24/2020 0632   LEUKOCYTESUR MODERATE (A) 09/24/2020 [id number]   Sepsis Labs: '@LABRCNTIP'$ (procalcitonin:4,lacticidven:4)  ) Recent Results (from the past 240 hour(s))  Urine Culture     Status: Abnormal   Collection Time: 09/24/20  6:32 AM   Specimen: Urine, Clean Catch  Result Value Ref Range Status   Specimen Description   Final    URINE, CLEAN CATCH Performed at [facility], [address], [place], [place] [address]    Special Requests   Final    NONE Performed at [facility], [address] [place], [place] [address]    Culture (A)  Final    >=100,000 COLONIES/mL PSEUDOMONAS AERUGINOSA >=100,000 COLONIES/mL KLEBSIELLA PNEUMONIAE    Report Status 09/26/2020 FINAL  Final   Organism ID, Bacteria PSEUDOMONAS AERUGINOSA (A)  Final   Organism ID, Bacteria KLEBSIELLA PNEUMONIAE (A)  Final      Susceptibility   Klebsiella pneumoniae - MIC*    AMPICILLIN >=32 RESISTANT Resistant     CEFAZOLIN 16 SENSITIVE Sensitive     CEFEPIME <=0.12 SENSITIVE Sensitive     CEFTRIAXONE <=0.25 SENSITIVE Sensitive     CIPROFLOXACIN <=0.25 SENSITIVE Sensitive     GENTAMICIN <=1 SENSITIVE Sensitive     IMIPENEM <=0.25 SENSITIVE Sensitive     NITROFURANTOIN 128 RESISTANT Resistant     TRIMETH/SULFA <=20 SENSITIVE Sensitive     AMPICILLIN/SULBACTAM >=32 RESISTANT Resistant     PIP/TAZO >=128 RESISTANT Resistant     * >=100,000 COLONIES/mL KLEBSIELLA PNEUMONIAE   Pseudomonas aeruginosa - MIC*    CEFTAZIDIME 4 SENSITIVE Sensitive     CIPROFLOXACIN 0.5 SENSITIVE Sensitive     GENTAMICIN <=1 SENSITIVE Sensitive     IMIPENEM 1 SENSITIVE Sensitive     PIP/TAZO <=4 SENSITIVE Sensitive     CEFEPIME 2 SENSITIVE Sensitive     * >=100,000 COLONIES/mL PSEUDOMONAS AERUGINOSA  Resp Panel by RT-PCR (Flu A&B, Covid) Nasopharyngeal Swab      Status: None   Collection Time: 09/24/20  6:41 AM   Specimen: Nasopharyngeal Swab; Nasopharyngeal(NP) swabs in vial transport medium  Result Value Ref Range Status   SARS Coronavirus 2 by RT PCR NEGATIVE NEGATIVE Final    Comment: (NOTE) SARS-CoV-2 target nucleic acids are NOT DETECTED.  The SARS-CoV-2 RNA is generally detectable in upper respiratory specimens during the acute phase of infection. The lowest concentration of SARS-CoV-2 viral copies this assay can detect is 138 copies/mL. A negative result does not preclude SARS-Cov-2 infection and should not be used as the sole basis for treatment or other patient management decisions. A negative result may occur with  improper specimen collection/handling, submission of specimen other than nasopharyngeal swab, presence of viral mutation(s) within the areas targeted by this assay, and inadequate number of viral copies(<138 copies/mL). A negative result must be combined with clinical observations, patient history, and epidemiological information. The expected result is Negative.  Fact Sheet for Patients:  EntrepreneurPulse.com.au  Fact Sheet for Healthcare Providers:  IncredibleEmployment.be  This test is no t yet approved or cleared by the [place] FDA and  has been authorized for detection and/or diagnosis of SARS-CoV-2 by FDA under an Emergency Use Authorization (EUA). This EUA will remain  in effect (meaning this test can be used) for the duration of the COVID-19 declaration under Section 564(b)(1) of the Act, 21 U.S.C.section 360bbb-3(b)(1), unless the authorization is terminated  or revoked sooner.       Influenza A  by PCR NEGATIVE NEGATIVE Final   Influenza B by PCR NEGATIVE NEGATIVE Final    Comment: (NOTE) The Xpert Xpress SARS-CoV-2/FLU/RSV plus assay is intended as an aid in the diagnosis of influenza from Nasopharyngeal swab specimens and should not be used as a sole basis  for treatment. Nasal washings and aspirates are unacceptable for Xpert Xpress SARS-CoV-2/FLU/RSV testing.  Fact Sheet for Patients: EntrepreneurPulse.com.au  Fact Sheet for Healthcare Providers: IncredibleEmployment.be  This test is not yet approved or cleared by the [place] FDA and has been authorized for detection and/or diagnosis of SARS-CoV-2 by FDA under an Emergency Use Authorization (EUA). This EUA will remain in effect (meaning this test can be used) for the duration of the COVID-19 declaration under Section 564(b)(1) of the Act, 21 U.S.C. section 360bbb-3(b)(1), unless the authorization is terminated or revoked.  Performed at [facility], [address], [place], [place] [address]   Blood Culture (routine x 2)     Status: None (Preliminary result)   Collection Time: 09/24/20  6:41 AM   Specimen: BLOOD  Result Value Ref Range Status   Specimen Description BLOOD  Final   Special Requests NONE  Final   Culture   Final    NO GROWTH 2 DAYS Performed at [facility], [address], [place], [place] [address]    Report Status PENDING  Incomplete  Blood Culture (routine x 2)     Status: None (Preliminary result)   Collection Time: 09/24/20  6:42 AM   Specimen: BLOOD  Result Value Ref Range Status   Specimen Description BLOOD  Final   Special Requests NONE  Final   Culture   Final    NO GROWTH 2 DAYS Performed at [facility], [address] [place], [place] [address]    Report Status PENDING  Incomplete  MRSA Next Gen by PCR, Nasal     Status: None   Collection Time: 09/24/20 12:45 PM   Specimen: Nasal Mucosa; Nasal Swab  Result Value Ref Range Status   MRSA by PCR Next Gen NOT DETECTED NOT DETECTED Final    Comment: (NOTE) The GeneXpert MRSA Assay (FDA approved for NASAL specimens only), is one component of a comprehensive MRSA colonization surveillance program. It is not intended to diagnose MRSA infection nor to guide or  monitor treatment for MRSA infections. Test performance is not FDA approved in patients less than 5 years old. Performed at [facility], [address] [place], [place] [address]          Radiology Studies: US SCROTUM [facility]  Result Date: 09/24/2020 CLINICAL DATA:  Mass of testicle. EXAM: SCROTAL ULTRASOUND DOPPLER ULTRASOUND OF THE TESTICLES TECHNIQUE: Complete ultrasound examination of the testicles, epididymis, and other scrotal structures was performed. Color and spectral Doppler ultrasound were also utilized to evaluate blood flow to the testicles. COMPARISON:  Abdominopelvic CT 07/21/2020 FINDINGS: Right testicle Measurements: 4.3 x 2.8 x 3.2 cm. Homogeneous echogenicity. Normal blood flow. No testicular mass or microlithiasis visualized. Left testicle Measurements: 3.3 x 2.2 x 3.3 cm. Homogeneous echogenicity. Normal blood flow. No mass or microlithiasis visualized. Right epididymis: Heterogeneous and hyperemic. No focal lesion or fluid collection. Left epididymis:  Normal in size and appearance. Hydrocele:  Small to moderate on the right. Varicocele:  None visualized. Pulsed Doppler interrogation of both testes demonstrates normal low resistance arterial and venous waveforms bilaterally. Other: Heterogeneity of the intrascrotal fat on the right. There is a possible fat containing right inguinal hernia, not well-defined by ultrasound. IMPRESSION: 1.  Heterogeneous right epididymis with increased vascularity suspicious for epididymitis. 2. Heterogeneous appearance of the intrascrotal fat in the right with possible fat containing inguinal hernia. This is not definite, and not well assessed by ultrasound. 3. Small to moderate right hydrocele. 4. Normal sonographic appearance of both testicles without intratesticular mass. Electronically Signed   By: Keith Rake M.D.   On: 09/24/2020 17:18        Scheduled Meds:  Chlorhexidine Gluconate Cloth  6 each Topical Daily   donepezil  10 mg  Oral QHS   enoxaparin (LOVENOX) injection  40 mg Subcutaneous Q24H   feeding supplement  237 mL Oral BID BM   ferrous sulfate  325 mg Oral Daily   finasteride  5 mg Oral Daily   folic acid  1 mg Intravenous Daily   gabapentin  100 mg Oral QHS   insulin aspart  0-15 Units Subcutaneous Q4H   metoprolol tartrate  25 mg Oral BID   mirtazapine  7.5 mg Oral QHS   risperiDONE  0.25 mg Oral BID   tamsulosin  0.4 mg Oral Daily   thiamine injection  100 mg Intravenous Daily   Continuous Infusions:  azithromycin 500 mg (09/25/20 1231)   ceFEPime (MAXIPIME) IV Stopped (09/26/20 0125)   dextrose 5 % and 0.9% NaCl 50 mL/hr at 09/26/20 0730   vancomycin 750 mg (09/26/20 0815)     LOS: 2 days   The patient is critically ill with multiple organ systems failure and requires high complexity decision making for assessment and support, frequent evaluation and titration of therapies, application of advanced monitoring technologies and extensive interpretation of multiple databases. Critical Care Time devoted to patient care services described in this note  Time spent: 40 minutes     Nahla Lukin, Geraldo Docker, MD [facility]   If 7PM-7AM, please contact night-coverage 09/26/2020, 9:05 AM

## 2020-09-26 NOTE — Progress Notes (Signed)
[facility] night shift SDU coverage note.  The nursing staff reported that the patient continues to be very restless, verbally and physically aggressive towards the staff interfering with his care.  Haloperidol 4 mg IVP x1 given earlier was not successful.  He was unable to be redirected, confused to place and situation when I saw him earlier.  He received 2 mg of lorazepam early afternoon yesterday.  Soft wrist restraints and lorazepam 1 mg IVP x1 dose ordered.  Tennis Must, MD.

## 2020-09-26 NOTE — TOC Initial Note (Signed)
Transition of Care [facility]) - Initial/Assessment Note    Patient Details  Name: Allen Yu MRN: [id number] Date of Birth: 1945-04-05  Transition of Care [facility]) CM/SW Contact:    Boneta Lucks, RN Phone Number: 09/26/2020, 10:46 AM  Clinical Narrative:  Patient admitted with severe sepsis. Patient has a high risk for readmission. MD is recommending SNF. TOC spoke with Allen Yu patients wife. Patient is confused, combative, needing restraints and medication. Allen [facility] states [facility] has a new team of people NP,RN, and aide - 13 hours a day.  She states he is much worse in the hospital and does not want to be here. He does not act this way at home. She is only 90 lbs and if he acted this way at home, she could not handle him. She will see how this team does with his care and when she can not handle him she will be agreeable to placement.                Expected Discharge Plan: [place] Barriers to Discharge: Continued Medical Work up  Patient Goals and CMS Choice Patient states their goals for this hospitalization and ongoing recovery are:: to go home. CMS Medicare.gov Compare Post Acute Care list provided to:: Patient Represenative (must comment) Choice offered to / list presented to : Spouse  Expected Discharge Plan and Services Expected Discharge Plan: [place] Acute Care Choice: [place] arrangements for the past 2 months: Single Family Home        Prior Living Arrangements/Services Living arrangements for the past 2 months: Single Family Home Lives with:: Spouse Patient language and need for interpreter reviewed:: Yes Do you feel safe going back to the place where you live?: Yes      Need for Family Participation in Patient Care: Yes (Comment) Care giver support system in place?: Yes (comment)   Criminal Activity/Legal Involvement Pertinent to Current Situation/Hospitalization: No - Comment as  needed  Activities of Daily Living Home Assistive Devices/Equipment: Wheelchair ADL Screening (condition at time of admission) Patient's cognitive ability adequate to safely complete daily activities?: Yes Is the patient deaf or have difficulty hearing?: Yes Does the patient have difficulty seeing, even when wearing glasses/contacts?: No Does the patient have difficulty concentrating, remembering, or making decisions?: Yes Patient able to express need for assistance with ADLs?: Yes Does the patient have difficulty dressing or bathing?: Yes Independently performs ADLs?: No Communication: Independent Dressing (OT): Needs assistance Is this a change from baseline?: Pre-admission baseline Grooming: Needs assistance Is this a change from baseline?: Pre-admission baseline Feeding: Independent Bathing: Needs assistance Is this a change from baseline?: Pre-admission baseline Toileting: Needs assistance Is this a change from baseline?: Pre-admission baseline In/Out Bed: Needs assistance Is this a change from baseline?: Pre-admission baseline Does the patient have difficulty walking or climbing stairs?: Yes Weakness of Legs: Right Weakness of Arms/Hands: Both  Permission Sought/Granted Permission sought to share information with : Case Manager    Share Information with NAME: Allen Yu     Permission granted to share info w Relationship: Wife    Emotional Assessment   Attitude/Demeanor/Rapport: Combative    Alcohol / Substance Use: Not Applicable Psych Involvement: No (comment)  Admission diagnosis:  Severe sepsis ([place]) [A41.9, R65.20] Sepsis, due to unspecified organism, unspecified whether acute organ dysfunction present [facility]) [A41.9] Patient Active Problem List   Diagnosis Date Noted   Severe sepsis ([place]) 09/24/2020  Schizophrenia ([place]) 09/24/2020   Dementia without behavioral disturbance ([place]) 09/24/2020   Lung nodule 09/24/2020   Indwelling Foley catheter present 09/24/2020    PVD (peripheral vascular disease) ([place]) 09/24/2020   Mass of testicle 09/24/2020   Problem related to unspecified psychosocial circumstances 08/01/2020   Hypermetropia 07/22/2020   AF (paroxysmal atrial fibrillation) ([place]) 07/22/2020   Klebsiella sepsis ([place]) 07/22/2020   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   SIRS (systemic inflammatory response syndrome) ([place]) XX123456   Acute metabolic encephalopathy XX123456   Hyponatremia    S/P AKA (above knee amputation) unilateral, left ([place]) 06/28/2020   Gangrene of left foot ([place]) 06/28/2020   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 ([place]) 05/27/2020   Critical lower limb ischemia ([place]) 05/21/2020   Protein-calorie malnutrition, severe 05/19/2020   Plantar callus 12/24/2019   Cavitary pneumonia 09/14/2019   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   Unintentional weight loss 09/14/2019   Coronary artery calcification seen on CT scan 10/28/2018   Hilar adenopathy 04/10/2016   Lung nodule seen on imaging study 07/09/2015   Acute encephalopathy    Hypoglycemia due to insulin    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[place]) 07/08/2015   Acute kidney injury superimposed on chronic kidney disease ([place]) 07/08/2015   Tobacco use disorder 07/08/2015   Diverticulosis of colon without hemorrhage    Left inguinal hernia 09/16/2013   Altered mental status 11/17/2012   Hypothermia 11/17/2012   Leukocytosis 11/17/2012   CKD (chronic kidney disease), stage II    PTSD (post-traumatic stress disorder)    GERD (gastroesophageal reflux disease)    Essential (primary) hypertension 09/10/2011   Anemia in chronic illness 09/10/2011   Diabetic neuropathy ([place]) 09/10/2011   DDD (degenerative disc disease), lumbar 09/10/2011   Psychosis ([place]) 09/10/2011   Diabetes mellitus type 2, uncontrolled ([place]) 09/10/2011   HLD (hyperlipidemia) 09/10/2011   Microalbuminuria 09/10/2011   PCP:  [name], MD Pharmacy:   [facility] - [address], [place] [address] Phone: [phone] Fax: [place] [phone] - [address], [place] AT [address] [place] [address] Phone: [phone] Fax: [phone]  [place], [address] [place] [address] Phone: [phone] Fax: [place], [place] - [address] [place] [address] Phone: [phone] Fax: [phone]  Readmission Risk Interventions Readmission Risk Prevention Plan 09/26/2020 07/26/2020 06/30/2020  Post Dischage Appt - - Complete  Medication Screening - - Complete  Transportation Screening Complete Complete Complete  PCP or Specialist Appt within 3-5 Days - Complete -  HRI or Home Care Consult - Complete -  Social Work Consult for Recovery Care Planning/Counseling - Complete -  Palliative Care Screening - Complete -  Medication Review Press photographer) Complete Complete -  Combine or Home Care Consult Complete - -  SW Recovery Care/Counseling Consult Complete - -  Palliative Care Screening Not Complete - -  Comments Referred to [facility] Palliative in July - -  [place] Not Complete - -  SNF Comments Refusing - -  Some recent data might be hidden

## 2020-09-26 NOTE — Progress Notes (Signed)
Patient continually interfering with medical care. Patient continues to pull at lines, remove IVs, pull at foley, etc. Patient is verbally and physically abusive towards staff. One time order of Haldol '4mg'$  was ineffective. MD Olevia Bowens notified of behavior.

## 2020-09-26 NOTE — Progress Notes (Signed)
Initial Nutrition Assessment  DOCUMENTATION CODES:   Severe malnutrition in context of chronic illness  INTERVENTION:  Ensure Enlive po BID, each supplement provides 350 kcal and 20 grams of protein   Feeding assistance all meals  Recommend OT/ST evaluation feeding ability and recommend safest diet  Recommend discontinue therapeutic restriction   NUTRITION DIAGNOSIS:   Severe Malnutrition related to chronic illness (dementia) as evidenced by severe fat depletion.   GOAL:  Patient will meet greater than or equal to 90% of their needs (if feasible given his cognitive impairment)   MONITOR:  PO intake, Supplement acceptance, Weight trends, I & O's, Labs, Skin  REASON FOR ASSESSMENT:   Malnutrition Screening Tool    ASSESSMENT: Patient is an underweight 75 yo male with hx of DM, GERD, Dementia, Constipation, CKD-3a, anemia, PTSD, HTN, PVD and left AKA. Presents with severe sepsis, testicle mass.   Patient is not responding verbally to questions and is fidgeting in bed during visit. His breakfast is here untouched. Yesterday noted pt ate 90% of dinner per nursing. Expect he requires assistance with feeding. Question if swallow difficulty present given his altered mentation. He is taking a medication for appetite.    Intake/Output Summary (Last 24 hours) at 09/26/2020 1056 Last data filed at 09/26/2020 0730 Gross per 24 hour  Intake 2379.62 ml  Output 1150 ml  Net 1229.62 ml    Weights reviewed. Usual range 55-59 kg.   Medications reviewed and include: Aricept, Ferrous sulfate, Insulin , Remeron. IV-  B-1  (100 mg) daily.  Labs: BMP Latest Ref Rng & Units 09/26/2020 09/25/2020 09/25/2020  Glucose 70 - 99 mg/dL 104(H) 105(H) 51(L)  BUN 8 - 23 mg/dL '17 17 17  '$ Creatinine 0.61 - 1.24 mg/dL 0.93 0.90 0.98  BUN/Creat Ratio 6 - 22 (calc) - - -  Sodium 135 - 145 mmol/L 137 134(L) 136  Potassium 3.5 - 5.1 mmol/L 3.2(L) 3.2(L) 2.8(L)  Chloride 98 - 111 mmol/L 107 103 104  CO2 22 - 32  mmol/L '25 23 28  '$ Calcium 8.9 - 10.3 mg/dL 8.2(L) 7.8(L) 7.9(L)       NUTRITION - FOCUSED PHYSICAL EXAM:  Nutrition-Focused physical exam completed. Findings are severe thoracic, upper arms moderate buccal fat depletion, severe right patellar, dorsal, right quadricept and calf muscle depletion, and mild right lower extremity edema.    Diet Order:   Diet Order             Diet heart healthy/carb modified Room service appropriate? Yes; Fluid consistency: Thin  Diet effective now                   EDUCATION NEEDS:  Not appropriate for education at this time  Skin:  Skin Assessment: Skin Integrity Issues: Skin Integrity Issues:: Diabetic Ulcer Diabetic Ulcer: right toe per nurisng  Last BM:  9/5  Height:   Ht Readings from Last 1 Encounters:  09/24/20 '6\' 1"'$  (1.854 m)    Weight:   Wt Readings from Last 1 Encounters:  09/26/20 54.8 kg    Adjusted Ideal Body Weight:   77.2 kg  BMI:  Body mass index is 15.94 kg/m.  Estimated Nutritional Needs:   Kcal:  [id number]  Protein:  88-95 gr  Fluid:  1.7-1.8 liters daily   Colman Cater MS,RD,CSG,LDN Contact: Shea Evans

## 2020-09-27 ENCOUNTER — Encounter (HOSPITAL_COMMUNITY)
Admission: EM | Disposition: A | Discharge status: Home Health | Payer: Self-pay | Source: Home / Self Care | Attending: Internal Medicine

## 2020-09-27 ENCOUNTER — Inpatient Hospital Stay (HOSPITAL_COMMUNITY): Payer: Medicare Other

## 2020-09-27 ENCOUNTER — Ambulatory Visit (HOSPITAL_COMMUNITY): Admission: RE | Admit: 2020-09-27 | Payer: Medicare Other | Source: Home / Self Care | Admitting: Surgery

## 2020-09-27 DIAGNOSIS — I509 Heart failure, unspecified: Secondary | ICD-10-CM

## 2020-09-27 DIAGNOSIS — Z7189 Other specified counseling: Secondary | ICD-10-CM

## 2020-09-27 DIAGNOSIS — A419 Sepsis, unspecified organism: Secondary | ICD-10-CM | POA: Diagnosis not present

## 2020-09-27 DIAGNOSIS — F0391 Unspecified dementia with behavioral disturbance: Secondary | ICD-10-CM | POA: Diagnosis not present

## 2020-09-27 DIAGNOSIS — Z515 Encounter for palliative care: Secondary | ICD-10-CM | POA: Diagnosis not present

## 2020-09-27 DIAGNOSIS — Z66 Do not resuscitate: Secondary | ICD-10-CM

## 2020-09-27 DIAGNOSIS — M5136 Other intervertebral disc degeneration, lumbar region: Secondary | ICD-10-CM | POA: Diagnosis not present

## 2020-09-27 DIAGNOSIS — I480 Paroxysmal atrial fibrillation: Secondary | ICD-10-CM | POA: Diagnosis not present

## 2020-09-27 DIAGNOSIS — F0150 Vascular dementia without behavioral disturbance: Secondary | ICD-10-CM | POA: Diagnosis not present

## 2020-09-27 LAB — GLUCOSE, CAPILLARY
Glucose-Capillary: 165 mg/dL — ABNORMAL HIGH (ref 70–99)
Glucose-Capillary: 171 mg/dL — ABNORMAL HIGH (ref 70–99)
Glucose-Capillary: 197 mg/dL — ABNORMAL HIGH (ref 70–99)
Glucose-Capillary: 233 mg/dL — ABNORMAL HIGH (ref 70–99)
Glucose-Capillary: 282 mg/dL — ABNORMAL HIGH (ref 70–99)
Glucose-Capillary: 282 mg/dL — ABNORMAL HIGH (ref 70–99)

## 2020-09-27 LAB — COMPREHENSIVE METABOLIC PANEL
ALT: 14 U/L (ref 0–44)
AST: 18 U/L (ref 15–41)
Albumin: 2.3 g/dL — ABNORMAL LOW (ref 3.5–5.0)
Alkaline Phosphatase: 143 U/L — ABNORMAL HIGH (ref 38–126)
Anion gap: 9 (ref 5–15)
BUN: 18 mg/dL (ref 8–23)
CO2: 23 mmol/L (ref 22–32)
Calcium: 8.6 mg/dL — ABNORMAL LOW (ref 8.9–10.3)
Chloride: 107 mmol/L (ref 98–111)
Creatinine, Ser: 1.07 mg/dL (ref 0.61–1.24)
GFR, Estimated: >60 mL/min (ref >60)
Glucose, Bld: 222 mg/dL — ABNORMAL HIGH (ref 70–99)
Potassium: 3.5 mmol/L (ref 3.5–5.1)
Sodium: 139 mmol/L (ref 135–145)
Total Bilirubin: 1 mg/dL (ref 0.3–1.2)
Total Protein: 6 g/dL — ABNORMAL LOW (ref 6.5–8.1)

## 2020-09-27 LAB — ECHOCARDIOGRAM COMPLETE
Area-P 1/2: 10.99 cm2
Height: 73 in
S' Lateral: 3.22 cm
Weight: 2035.29 oz

## 2020-09-27 LAB — MAGNESIUM: Magnesium: 2 mg/dL (ref 1.7–2.4)

## 2020-09-27 LAB — CBC WITH DIFFERENTIAL/PLATELET
Abs Immature Granulocytes: 0.12 10*3/uL — ABNORMAL HIGH (ref 0.00–0.07)
Basophils Absolute: 0.1 10*3/uL (ref 0.0–0.1)
Basophils Relative: 0 %
Eosinophils Absolute: 0 10*3/uL (ref 0.0–0.5)
Eosinophils Relative: 0 %
HCT: 24 % — ABNORMAL LOW (ref 39.0–52.0)
Hemoglobin: 7.8 g/dL — ABNORMAL LOW (ref 13.0–17.0)
Immature Granulocytes: 1 %
Lymphocytes Relative: 4 %
Lymphs Abs: 0.9 10*3/uL (ref 0.7–4.0)
MCH: 30.1 pg (ref 26.0–34.0)
MCHC: 32.5 g/dL (ref 30.0–36.0)
MCV: 92.7 fL (ref 80.0–100.0)
Monocytes Absolute: 0.8 10*3/uL (ref 0.1–1.0)
Monocytes Relative: 3 %
Neutro Abs: 20.4 10*3/uL — ABNORMAL HIGH (ref 1.7–7.7)
Neutrophils Relative %: 92 %
Platelets: 291 10*3/uL (ref 150–400)
RBC: 2.59 MIL/uL — ABNORMAL LOW (ref 4.22–5.81)
RDW: 17.2 % — ABNORMAL HIGH (ref 11.5–15.5)
WBC: 22.3 10*3/uL — ABNORMAL HIGH (ref 4.0–10.5)
nRBC: 0 % (ref 0.0–0.2)

## 2020-09-27 LAB — HEMOGLOBIN A1C
Hgb A1c MFr Bld: 9.7 % — ABNORMAL HIGH (ref 4.8–5.6)
Mean Plasma Glucose: 232 mg/dL

## 2020-09-27 LAB — PHOSPHORUS: Phosphorus: 2.6 mg/dL (ref 2.5–4.6)

## 2020-09-27 SURGERY — ABDOMINAL AORTOGRAM W/LOWER EXTREMITY
Anesthesia: LOCAL

## 2020-09-27 MED ORDER — IPRATROPIUM-ALBUTEROL 0.5-2.5 (3) MG/3ML IN SOLN
3.0000 mL | Freq: Three times a day (TID) | RESPIRATORY_TRACT | Status: DC
  Administered 2020-09-27 – 2020-09-28 (×6): 3 mL via RESPIRATORY_TRACT
  Filled 2020-09-27 (×6): qty 3

## 2020-09-27 MED ORDER — POTASSIUM CHLORIDE CRYS ER 20 MEQ PO TBCR
40.0000 meq | EXTENDED_RELEASE_TABLET | Freq: Once | ORAL | Status: AC
  Administered 2020-09-27: 40 meq via ORAL
  Filled 2020-09-27: qty 2

## 2020-09-27 MED ORDER — INSULIN DETEMIR 100 UNIT/ML ~~LOC~~ SOLN
8.0000 [IU] | Freq: Every day | SUBCUTANEOUS | Status: DC
  Administered 2020-09-27: 8 [IU] via SUBCUTANEOUS
  Filled 2020-09-27 (×2): qty 0.08

## 2020-09-27 MED ORDER — ALBUTEROL SULFATE (2.5 MG/3ML) 0.083% IN NEBU
INHALATION_SOLUTION | RESPIRATORY_TRACT | Status: AC
  Administered 2020-09-27: 2.5 mg
  Filled 2020-09-27: qty 3

## 2020-09-27 MED ORDER — IPRATROPIUM BROMIDE 0.02 % IN SOLN
RESPIRATORY_TRACT | Status: AC
  Administered 2020-09-27: 0.5 mg
  Filled 2020-09-27: qty 2.5

## 2020-09-27 MED ORDER — FUROSEMIDE 10 MG/ML IJ SOLN
40.0000 mg | Freq: Once | INTRAMUSCULAR | Status: AC
  Administered 2020-09-27: 40 mg via INTRAVENOUS
  Filled 2020-09-27: qty 4

## 2020-09-27 NOTE — Progress Notes (Signed)
  Echocardiogram 2D Echocardiogram has been performed.  Allen Yu 09/27/2020, 2:44 PM

## 2020-09-27 NOTE — Progress Notes (Signed)
Inpatient Diabetes Program Recommendations  AACE/ADA: New Consensus Statement on Inpatient Glycemic Control (2015)  Target Ranges:  Prepandial:   less than 140 mg/dL      Peak postprandial:   less than 180 mg/dL (1-2 hours)      Critically ill patients:  140 - 180 mg/dL   Lab Results  Component Value Date   GLUCAP 282 (H) 09/27/2020   HGBA1C 9.7 (H) 09/24/2020    Review of Glycemic Control  Diabetes history: DM2 Outpatient Diabetes medications: Lantus 25 units QHS Current orders for Inpatient glycemic control: Novolog 0-15 units Q4H  HgbA1C - 9.7%  Inpatient Diabetes Program Recommendations:    Consider Semglee 12 units QHS  Follow glucose trends.  Thank you. Lorenda Peck, RD, LDN, CDE Inpatient Diabetes Coordinator [phone]

## 2020-09-27 NOTE — Progress Notes (Signed)
PROGRESS NOTE    [place] Allen Yu  [id number] DOB: [birth year] DOA: 09/24/2020 PCP: [name], MD   Brief Narrative:  Allen Yu is a 75 y.o. BM from SNF PMHx PTSD, schizophrenia, dementia, PVD, s/p LEFT AKA, paroxysmal atrial fibrillation, essential HTN, lung nodule, nephrolithiasis, renal retention s/p indwelling Foley, DM type II with complication, DM neuropathy , nephrolithiasis, chronic back pain   Wife reports when she woke up this morning the patient was making" gulping noise" and seemed to have difficulty breathing.  She checked his temperature is 100.9.  On arrival his temperature is 102.5.  Wife reports no cough, runny nose or sore throat.  No vomiting or diarrhea.  He has had an indwelling Foley catheter since June but it was changed about 3 days ago. He has a known wound to his fourth and fifth toe on the right and is being scheduled for an aortogram by vascular surgery next week.  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   ADDENDUM; proceeded to bedside in ICU to check on patient's testicular mass patient very combative unable to get a straight answer from patient.  Patient stating why the hell am I here and I cannot do anything to help him.   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9% saline 189m/hr: Overnight hypoglycemia Lab Results  Component Value Date   WBC 22.3 (H) 09/27/2020   WBC 32.7 (H) 09/26/2020   WBC 32.0 (H) 09/25/2020   WBC 34.5 (H) 09/25/2020   WBC 15.6 (H) 09/24/2020  Results for WOAKS, CRONISTER(MRN [id number] as of 09/26/2020 09:06  Ref. Range 09/24/2020 18:17 09/25/2020 01:57 09/26/2020 04:08  Procalcitonin Latest Units: ng/mL 45.49 52.70 41.50   Pulmonary effusion/pneumonia? - Current antibiotics will cover HCAP pneumonia -9/6 echocardiogram: Patient with flash pulm edema overnight, history of uncontrolled HTN and paroxysmal A. fib evaluate acute systolic CHF  RIGHT mass of testicle - Ultrasound scrotum revealed epididymitis current antibiotics should cover infection. - If no improvement will contact urology.  Will monitor closely for urinary retention - Strict in and out -9/5 Start levofloxacin 500 mg daily x10 days   Essential HTN -Metoprolol 25 mg BID -We will hold all additional BP medication secondary to severe sepsis    Paroxysmal atrial fibrillation -See HTN  DM type II uncontrolled with complication -9/3 hemoglobin A1c= 9.7 - 9/4 moderate SSI -9/6 Levemir 8 units - See severe sepsis  DM foot ulcers -Chronic will not address at this time. -9/6 Per wife patient is already scheduled to see vascular surgery for a femoropopliteal on right leg today.  Already tied into the  system will not address during his hospitalization   PVD -See foot ulcers  Chronic back pain/lumbar DDD -Tramadol 50 mg QID PRN -Remeron 7.5 mg daily   Schizophrenia -Risperidone 0.25 mg BID  Dementia without behavioral disturbance -Aiicept 10 mg daily-Remeron 7.5 mg nightly -9/4 patient DOES NOT have capacity to make medical decisions therefore  CANNOT refuse diagnostic or therapeutic tests.  Anemia -9/6 Anemia panel, LDH, haptoglobin pending  Hypokalemia - Potassium goal> - 9/6 K-Dur 40 mEq  Hypokalemia - Magnesium goal>2 - 9/5 magnesium IV 2 g  Goals of care - 9/5 Palliative Care Consult: Patient frequent flyer, demented, severe sepsis good chance may not survive this hospitalization.  Evaluate for change of CODE STATUS to DNR.  Home with hospice vs residential hospice   DVT prophylaxis: Lovenox Code Status: Full Family Communication: 9/6 wife at bedside for discussion of plan of care all questions answered Status is: Inpatient    Dispo: The patient is from: Home              Anticipated d/c is to: Home              Anticipated d/c date is: 3 days              Patient currently is not medically stable to d/c.      Consultants:    Procedures/Significant Events:  9/3 scrotal ultrasound: -Heterogeneous right epididymis with increased vascularity suspicious for epididymitis. -Heterogeneous appearance of the intrascrotal fat in the right with possible fat containing inguinal hernia. This is not definite, and not well assessed by ultrasound. -Small to moderate right hydrocele. -Normal sonographic appearance of both testicles without intratesticular mass. 9/3 transfused 1 unit PRBC 9/6 PCXR;New severe diffuse bilateral airspace disease with layering effusions. Findings could reflect edema or infection.   I have personally reviewed and interpreted all radiology studies and my findings are as above.  VENTILATOR SETTINGS: Nasal cannula 9/6 Flow 4 L/min SPO2 92%   Cultures   Antimicrobials: Anti-infectives (From admission, onward)    Start     Ordered Stop   09/26/20 1230  levofloxacin (LEVAQUIN) IVPB 500 mg  Status:  Discontinued        09/26/20 1034 09/26/20 1052   09/26/20 1145  levofloxacin (LEVAQUIN) IVPB 750 mg        09/26/20 1053     09/26/20 1145  doxycycline (VIBRAMYCIN) 100 mg in sodium  chloride 0.9 % 250 mL IVPB        09/26/20 1053     09/25/20 0800  vancomycin (VANCOREADY) IVPB 750 mg/150 mL  Status:  Discontinued        09/24/20 0740 09/26/20 1052   09/24/20 1900  ceFEPIme (MAXIPIME) 2 g in sodium chloride 0.9 % 100 mL IVPB  Status:  Discontinued        09/24/20 0740 09/26/20 1052   09/24/20 1100  vancomycin (VANCOCIN) IVPB 1000 mg/200 mL premix  Status:  Discontinued        09/24/20 1057 09/24/20 1107   09/24/20 1100  ceFEPIme (MAXIPIME) 2 g in sodium chloride 0.9 % 100 mL IVPB  Status:  Discontinued        09/24/20 1057 09/24/20 1107   09/24/20 1100  azithromycin (ZITHROMAX) 500 mg in sodium chloride 0.9 % 250 mL IVPB  Status:  Discontinued        09/24/20 1057 09/26/20 1037   09/24/20 0645  ceFEPIme (MAXIPIME) 2 g in sodium chloride 0.9 % 100 mL IVPB  09/24/20 0632 09/24/20 0743   09/24/20 0645  metroNIDAZOLE (FLAGYL) IVPB 500 mg        09/24/20 [id number] 09/24/20 0907   09/24/20 0645  vancomycin (VANCOCIN) IVPB 1000 mg/200 mL premix        09/24/20 [id number] 09/24/20 1030        Devices    LINES / TUBES:      Continuous Infusions:  doxycycline (VIBRAMYCIN) IV Stopped (09/27/20 0257)   levofloxacin (LEVAQUIN) IV 750 mg (09/27/20 1008)     Objective: Vitals:   09/27/20 0716 09/27/20 0800 09/27/20 0811 09/27/20 0900  BP:  (!) 183/92  (!) 147/89  Pulse:  100  (!) 53  Resp: (!) 29 (!) 21  (!) 23  Temp: 98.2 F (36.8 C)     TempSrc: Axillary     SpO2:  92% 91% 94%  Weight:      Height:        Intake/Output Summary (Last 24 hours) at 09/27/2020 1047 Last data filed at 09/27/2020 0909 Gross per 24 hour  Intake 2289.29 ml  Output 3145 ml  Net -855.71 ml    Filed Weights   09/25/20 0500 09/26/20 0416 09/27/20 0342  Weight: 53.3 kg 54.8 kg 57.7 kg   Physical Exam:  General: Alert, upset that he has to stay in the hospital positive acute respiratory distress Eyes: negative scleral hemorrhage, negative anisocoria, negative icterus ENT: Negative  Runny nose, negative gingival bleeding, Neck:  Negative scars, masses, torticollis, lymphadenopathy, JVD Lungs: decreased breath sounds bilaterally without wheezes or crackles Cardiovascular: Regular rate and rhythm without murmur gallop or rub normal S1 and S2 Abdomen: negative abdominal pain, nondistended, positive soft, bowel sounds, no rebound, no ascites, no appreciable mass Extremities: No significant cyanosis, clubbing, or edema bilateral lower extremities Skin: Negative rashes, lesions, ulcers Psychiatric: Positive depression, negative anxiety, negative fatigue, negative mania  Central nervous system:  Cranial nerves II through XII intact, tongue/uvula midline, all extremities muscle strength 5/5, sensation intact throughout, negative dysarthria, negative expressive aphasia, negative receptive aphasia.       Data Reviewed: Care during the described time interval was provided by me .  I have reviewed this patient's available data, including medical history, events of note, physical examination, and all test results as part of my evaluation.   CBC: Recent Labs  Lab 09/24/20 0640 09/25/20 0157 09/25/20 1341 09/26/20 0408 09/27/20 0428  WBC 15.6* 34.5* 32.0* 32.7* 22.3*  NEUTROABS 14.2* 25.0* 30.1* 29.5* 20.4*  HGB 8.2* 6.6* 8.1* 8.1* 7.8*  HCT 25.3* 20.3* 24.6* 24.6* 24.0*  MCV 93.7 93.1 92.8 91.8 92.7  PLT 278 242 223 266 [id number]    Basic Metabolic Panel: Recent Labs  Lab 09/24/20 0640 09/25/20 0157 09/25/20 1341 09/26/20 0408 09/27/20 0428  NA 134* 136 134* 137 139  K 3.3* 2.8* 3.2* 3.2* 3.5  CL 96* 104 103 107 107  CO2 '27 28 23 25 23  '$ GLUCOSE 255* 51* 105* 104* 222*  BUN '14 17 17 17 18  '$ CREATININE 1.09 0.98 0.90 0.93 1.07  CALCIUM 8.8* 7.9* 7.8* 8.2* 8.6*  MG  --  1.3* 1.8 1.9 2.0  PHOS  --  3.1 3.2 2.7 2.6    GFR: Estimated Creatinine Clearance: 49.4 mL/min (by C-G formula based on SCr of 1.07 mg/dL). Liver Function Tests: Recent Labs  Lab 09/24/20 0640  09/25/20 0157 09/25/20 1341 09/26/20 0408 09/27/20 0428  AST '20 26 25 20 18  '$ ALT '15 16 16 15 14  '$ ALKPHOS 122 112 112  128* 143*  BILITOT 1.3* 1.3* 1.5* 1.1 1.0  PROT 6.5 5.6* 6.0* 5.9* 6.0*  ALBUMIN 2.9* 2.4* 2.5* 2.3* 2.3*    No results for input(s): LIPASE, AMYLASE in the last 168 hours. No results for input(s): AMMONIA in the last 168 hours. Coagulation Profile: Recent Labs  Lab 09/24/20 0640 09/25/20 0157  INR 1.3* 1.6*    Cardiac Enzymes: No results for input(s): CKTOTAL, CKMB, CKMBINDEX, TROPONINI in the last 168 hours. BNP (last 3 results) No results for input(s): PROBNP in the last 8760 hours. HbA1C: Recent Labs    09/24/20 1832  HGBA1C 9.7*   CBG: Recent Labs  Lab 09/26/20 1612 09/26/20 2046 09/27/20 0003 09/27/20 0343 09/27/20 0718  GLUCAP 182* 161* 165* 197* 282*    Lipid Profile: No results for input(s): CHOL, HDL, LDLCALC, TRIG, CHOLHDL, LDLDIRECT in the last 72 hours. Thyroid Function Tests: No results for input(s): TSH, T4TOTAL, FREET4, T3FREE, THYROIDAB in the last 72 hours. Anemia Panel: No results for input(s): VITAMINB12, FOLATE, FERRITIN, TIBC, IRON, RETICCTPCT in the last 72 hours. Urine analysis:    Component Value Date/Time   COLORURINE YELLOW 09/24/2020 0632   APPEARANCEUR CLEAR 09/24/2020 0632   LABSPEC 1.015 09/24/2020 0632   PHURINE 7.0 09/24/2020 0632   GLUCOSEU 250 (A) 09/24/2020 0632   HGBUR MODERATE (A) 09/24/2020 0632   BILIRUBINUR NEGATIVE 09/24/2020 0632   KETONESUR NEGATIVE 09/24/2020 0632   PROTEINUR 100 (A) 09/24/2020 0632   UROBILINOGEN 0.2 02/18/2014 0950   NITRITE NEGATIVE 09/24/2020 0632   LEUKOCYTESUR MODERATE (A) 09/24/2020 0632   Sepsis Labs: '@LABRCNTIP'$ (procalcitonin:4,lacticidven:4)  ) Recent Results (from the past 240 hour(s))  Urine Culture     Status: Abnormal   Collection Time: 09/24/20  6:32 AM   Specimen: Urine, Clean Catch  Result Value Ref Range Status   Specimen Description   Final     URINE, CLEAN CATCH Performed at [facility], [address] [place], [place] [address]    Special Requests   Final    NONE Performed at [facility], [address], [place], [place] [address]    Culture (A)  Final    >=100,000 COLONIES/mL PSEUDOMONAS AERUGINOSA >=100,000 COLONIES/mL KLEBSIELLA PNEUMONIAE    Report Status 09/26/2020 FINAL  Final   Organism ID, Bacteria PSEUDOMONAS AERUGINOSA (A)  Final   Organism ID, Bacteria KLEBSIELLA PNEUMONIAE (A)  Final      Susceptibility   Klebsiella pneumoniae - MIC*    AMPICILLIN >=32 RESISTANT Resistant     CEFAZOLIN 16 SENSITIVE Sensitive     CEFEPIME <=0.12 SENSITIVE Sensitive     CEFTRIAXONE <=0.25 SENSITIVE Sensitive     CIPROFLOXACIN <=0.25 SENSITIVE Sensitive     GENTAMICIN <=1 SENSITIVE Sensitive     IMIPENEM <=0.25 SENSITIVE Sensitive     NITROFURANTOIN 128 RESISTANT Resistant     TRIMETH/SULFA <=20 SENSITIVE Sensitive     AMPICILLIN/SULBACTAM >=32 RESISTANT Resistant     PIP/TAZO >=128 RESISTANT Resistant     * >=100,000 COLONIES/mL KLEBSIELLA PNEUMONIAE   Pseudomonas aeruginosa - MIC*    CEFTAZIDIME 4 SENSITIVE Sensitive     CIPROFLOXACIN 0.5 SENSITIVE Sensitive     GENTAMICIN <=1 SENSITIVE Sensitive     IMIPENEM 1 SENSITIVE Sensitive     PIP/TAZO <=4 SENSITIVE Sensitive     CEFEPIME 2 SENSITIVE Sensitive     * >=100,000 COLONIES/mL PSEUDOMONAS AERUGINOSA  Resp Panel by RT-PCR (Flu A&B, Covid) Nasopharyngeal Swab     Status: None   Collection Time: 09/24/20  6:41 AM  Specimen: Nasopharyngeal Swab; Nasopharyngeal(NP) swabs in vial transport medium  Result Value Ref Range Status   SARS Coronavirus 2 by RT PCR NEGATIVE NEGATIVE Final    Comment: (NOTE) SARS-CoV-2 target nucleic acids are NOT DETECTED.  The SARS-CoV-2 RNA is generally detectable in upper respiratory specimens during the acute phase of infection. The lowest concentration of SARS-CoV-2 viral copies this assay can detect is 138 copies/mL. A negative result  does not preclude SARS-Cov-2 infection and should not be used as the sole basis for treatment or other patient management decisions. A negative result may occur with  improper specimen collection/handling, submission of specimen other than nasopharyngeal swab, presence of viral mutation(s) within the areas targeted by this assay, and inadequate number of viral copies(<138 copies/mL). A negative result must be combined with clinical observations, patient history, and epidemiological information. The expected result is Negative.  Fact Sheet for Patients:  EntrepreneurPulse.com.au  Fact Sheet for Healthcare Providers:  IncredibleEmployment.be  This test is no t yet approved or cleared by the [place] FDA and  has been authorized for detection and/or diagnosis of SARS-CoV-2 by FDA under an Emergency Use Authorization (EUA). This EUA will remain  in effect (meaning this test can be used) for the duration of the COVID-19 declaration under Section 564(b)(1) of the Act, 21 U.S.C.section 360bbb-3(b)(1), unless the authorization is terminated  or revoked sooner.       Influenza A by PCR NEGATIVE NEGATIVE Final   Influenza B by PCR NEGATIVE NEGATIVE Final    Comment: (NOTE) The Xpert Xpress SARS-CoV-2/FLU/RSV plus assay is intended as an aid in the diagnosis of influenza from Nasopharyngeal swab specimens and should not be used as a sole basis for treatment. Nasal washings and aspirates are unacceptable for Xpert Xpress SARS-CoV-2/FLU/RSV testing.  Fact Sheet for Patients: EntrepreneurPulse.com.au  Fact Sheet for Healthcare Providers: IncredibleEmployment.be  This test is not yet approved or cleared by the [place] FDA and has been authorized for detection and/or diagnosis of SARS-CoV-2 by FDA under an Emergency Use Authorization (EUA). This EUA will remain in effect (meaning this test can be used) for  the duration of the COVID-19 declaration under Section 564(b)(1) of the Act, 21 U.S.C. section 360bbb-3(b)(1), unless the authorization is terminated or revoked.  Performed at [facility], [address], [place], [place] [address]   Blood Culture (routine x 2)     Status: None (Preliminary result)   Collection Time: 09/24/20  6:41 AM   Specimen: BLOOD  Result Value Ref Range Status   Specimen Description BLOOD  Final   Special Requests NONE  Final   Culture   Final    NO GROWTH 3 DAYS Performed at [facility], [address], [place], [place] [address]    Report Status PENDING  Incomplete  Blood Culture (routine x 2)     Status: None (Preliminary result)   Collection Time: 09/24/20  6:42 AM   Specimen: BLOOD  Result Value Ref Range Status   Specimen Description BLOOD  Final   Special Requests NONE  Final   Culture   Final    NO GROWTH 3 DAYS Performed at [facility], [address], [place], [place] [address]    Report Status PENDING  Incomplete  MRSA Next Gen by PCR, Nasal     Status: None   Collection Time: 09/24/20 12:45 PM   Specimen: Nasal Mucosa; Nasal Swab  Result Value Ref Range Status   MRSA by PCR Next Gen NOT DETECTED NOT DETECTED Final  Comment: (NOTE) The GeneXpert MRSA Assay (FDA approved for NASAL specimens only), is one component of a comprehensive MRSA colonization surveillance program. It is not intended to diagnose MRSA infection nor to guide or monitor treatment for MRSA infections. Test performance is not FDA approved in patients less than 29 years old. Performed at [facility], [address] [place], [place] [address]          Radiology Studies: DG CHEST PORT 1 VIEW  Result Date: 09/27/2020 CLINICAL DATA:  Hypoxia EXAM: PORTABLE CHEST 1 VIEW COMPARISON:  09/24/2020 FINDINGS: Diffuse bilateral airspace disease. Layering bilateral effusions. Heart is borderline in size. No acute bony abnormality. IMPRESSION: New severe diffuse bilateral  airspace disease with layering effusions. Findings could reflect edema or infection. Electronically Signed   By: Rolm Baptise M.D.   On: 09/27/2020 02:41        Scheduled Meds:  Chlorhexidine Gluconate Cloth  6 each Topical Daily   donepezil  10 mg Oral QHS   enoxaparin (LOVENOX) injection  40 mg Subcutaneous Q24H   feeding supplement  237 mL Oral BID BM   ferrous sulfate  325 mg Oral Daily   finasteride  5 mg Oral Daily   folic acid  1 mg Intravenous Daily   gabapentin  100 mg Oral QHS   haloperidol lactate  1 mg Intravenous TID   insulin aspart  0-15 Units Subcutaneous Q4H   ipratropium-albuterol  3 mL Nebulization TID   metoprolol tartrate  25 mg Oral BID   mirtazapine  7.5 mg Oral QHS   multivitamin-lutein  1 capsule Oral Daily   risperiDONE  0.25 mg Oral BID   tamsulosin  0.4 mg Oral Daily   thiamine injection  100 mg Intravenous Daily   Continuous Infusions:  doxycycline (VIBRAMYCIN) IV Stopped (09/27/20 0257)   levofloxacin (LEVAQUIN) IV 750 mg (09/27/20 1008)     LOS: 3 days   The patient is critically ill with multiple organ systems failure and requires high complexity decision making for assessment and support, frequent evaluation and titration of therapies, application of advanced monitoring technologies and extensive interpretation of multiple databases. Critical Care Time devoted to patient care services described in this note  Time spent: 40 minutes     Tejal Monroy, Geraldo Docker, MD [facility]   If 7PM-7AM, please contact night-coverage 09/27/2020, 10:47 AM

## 2020-09-27 NOTE — Consult Note (Signed)
Consultation Note Date: 09/27/2020   Patient Name: Allen Yu  DOB: 09-23-1945  MRN: [id number]  Age / Sex: 75 y.o., male  PCP: [name], MD Referring Physician: Allie Bossier, MD  Reason for Consultation: Establishing goals of care  HPI/Patient Profile: 75 y.o. male  with past medical history of schizophrenia, PTSD, dementia, PVD, s/p L AKA, atrial fibrillation, HTN, lung nodule, nepholithiasis, renal retention with indwelling foley, diabetes, diabetes neuropathy, chronic back pain admitted on 09/24/2020 with severe sepsis with right testicular epididymitis and likely pneumonia.   Clinical Assessment and Goals of Care: I met today at Allen Yu's bedside along with himself and his wife, [name]. He is present but unable to participate in conversation due to his dementia. His wife shares that they have been together for 62 years and married for 17 years. They have children and grandchildren separately and they are raising her 26 year old twin grandchildren in their home. Allen Yu is fully [place] service connected ([employer]) and they have been in the midst of beginning many care services at home. He also worked with [employer]   [name] explains that Allen Yu is very agitated in the hospital but this is not the case at home. She reports that he is incontinent and requires help but he can ambulate to get himself a glass of water and move about the house. She assists with bathing but he can wash his top half. She reports that he can sometimes even help to prepare dinner at home. She notes his appetite is mostly good but sometimes she has to really encourage him to eat but is usually successful. She is concerned about his risperidal and notes that she usually only gives him this at home as needed and this is usually when there area scheduled appointments or people coming to the home as this can get him  agitated. Otherwise she reports that he is not combative at home and if he becomes agitated she will walk away for ~10 minutes and this usually resolves.   [name] becomes tearful thinking of Allen Yu and what he is going through. She reports that they have had conversations regarding end of life and that he would never want to be resuscitated especially given his current quality of life. Desire for DNR confirmed. She talks of the difficulties and recurrent health concerns and hospitalizations since his amputation. His amputation was very difficult on them both and he was very depressed and even expressed thoughts of suicide soon after but none since. [name] has attended classes and ensured that he has no access to guns/knives or ways to harm himself but also confirms that he has not made any more suicidal comments. This makes her anxious considering his current vascular concern and she is hopeful that he will not need another amputation (if needed there will need to be further conversation regarding QOL and options). I also discussed with [name] the importance of self care and caregiver burnout.   All questions/concerns addressed. Emotional support provided.  Primary Decision Maker NEXT OF KIN - [name]    SUMMARY OF RECOMMENDATIONS   - DNR confirmed - Treat the treatable - hopeful to stabilize and treat infection with goal to return back to his home - Supplemental care in home being [facility] by [facility]  Code Status/Advance Care Planning: DNR   Symptom Management:  Per attending - no changes recommended.   Palliative Prophylaxis:  Bowel Regimen, Delirium Protocol, Frequent Pain Assessment, Oral Care, and Turn Reposition  Psycho-social/Spiritual:  Desire for further Chaplaincy support:yes Additional Recommendations: Caregiving  Support/Resources  Prognosis:  Guarded - frequent hospitalizations recently and progressing dementia.   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 The following aspects are pertinent.  Past Medical History:  Diagnosis Date   Anemia    Arthritis    Carotid stenosis    Chronic back pain    Chronic kidney disease    Constipation    Dementia (HCC)    Diabetes mellitus    GERD (gastroesophageal reflux disease)    History of kidney stones    Hypertension    Lung nodule    PAF (paroxysmal atrial fibrillation) (HCC)    Peripheral neuropathy    Peripheral vascular disease (HCC)    PTSD (post-traumatic stress disorder)    Schizophrenia ([place])    Tuberculosis    Treated   Social History   Socioeconomic History   Marital status: Married    Spouse name: [name]   Number of children: Not on file   Years of education: Not on file   Highest education level: Not on file  Occupational History   Not on file  Tobacco Use   Smoking status: Every Day    Packs/day: 0.50    Types: Cigarettes   Smokeless tobacco: Never   Tobacco comments:    burns them up  Vaping Use   Vaping Use: Never used  Substance and Sexual Activity   Alcohol use: No    Alcohol/week: 0.0 standard drinks   Drug use: Yes    Types: Marijuana    Comment: almost daily for pain   Sexual activity: Never  Other Topics Concern   Not on file  Social History Narrative   Not on file   Social Determinants of Health    Financial Resource Strain: Not on file  Food Insecurity: No Food Insecurity   Worried About Estate manager/land agent of Food in the Last Year: Never true   Ran Out of Food in the Last Year: Never true  Transportation Needs: No Transportation Needs   Lack of Transportation (Medical): No   Lack of Transportation (Non-Medical): No  Physical Activity: Not on file  Stress: No Stress Concern Present   Feeling of Stress : Only a little  Social Connections: Not on file   Family History  Problem Relation Age of Onset   Heart disease Mother        before age 61  Scheduled Meds:  Chlorhexidine Gluconate Cloth  6 each Topical Daily   donepezil  10 mg Oral QHS   enoxaparin (LOVENOX) injection  40 mg Subcutaneous Q24H   feeding supplement  237 mL Oral BID BM   ferrous sulfate  325 mg Oral Daily   finasteride  5 mg Oral Daily   folic acid  1 mg Intravenous Daily   gabapentin  100 mg Oral QHS   haloperidol lactate  1 mg Intravenous TID   insulin aspart  0-15 Units Subcutaneous Q4H   ipratropium-albuterol  3 mL Nebulization TID   metoprolol tartrate  25 mg Oral BID   mirtazapine  7.5 mg Oral QHS   multivitamin-lutein  1 capsule Oral Daily   potassium chloride  40 mEq Oral Once   risperiDONE  0.25 mg Oral BID   tamsulosin  0.4 mg Oral Daily   thiamine injection  100 mg Intravenous Daily   Continuous Infusions:  doxycycline (VIBRAMYCIN) IV Stopped (09/27/20 0257)   levofloxacin (LEVAQUIN) IV 750 mg (09/27/20 1008)   PRN Meds:.acetaminophen **OR** acetaminophen, metoprolol tartrate, senna-docusate, traMADol Allergies  Allergen Reactions   Bee Venom Anaphylaxis   Codeine Other (See Comments)    incoherent  Other reaction(s): Delirium   Propoxyphene Other (See Comments)    Dizziness, "Makes me feel drunk" Other reaction(s): Dizziness   Valsartan Other (See Comments)    incoherent Other reaction(s): Delirium   Review of Systems  Unable to perform ROS: Dementia   Physical  Exam Constitutional:      General: He is not in acute distress.    Appearance: He is underweight. He is ill-appearing.  Cardiovascular:     Rate and Rhythm: Tachycardia present.  Pulmonary:     Effort: No tachypnea, accessory muscle usage or respiratory distress.  Abdominal:     General: Abdomen is flat.     Palpations: Abdomen is soft.  Neurological:     Mental Status: He is easily aroused. He is confused.    Vital Signs: BP (!) 168/84   Pulse (!) 106   Temp 98.4 F (36.9 C) (Oral)   Resp (!) 25   Ht 6' 1"  (1.854 m)   Wt 57.7 kg   SpO2 92%   BMI 16.78 kg/m  Pain Scale: PAINAD POSS *See Group Information*: 1-Acceptable,Awake and alert Pain Score: 0-No pain   SpO2: SpO2: 92 % O2 Device:SpO2: 92 % O2 Flow Rate: .O2 Flow Rate (L/min): 4 L/min  IO: Intake/output summary:  Intake/Output Summary (Last 24 hours) at 09/27/2020 1123 Last data filed at 09/27/2020 9211 Gross per 24 hour  Intake 2289.29 ml  Output 3145 ml  Net -855.71 ml    LBM: Last BM Date: 09/25/20 Baseline Weight: Weight: 40.8 kg Most recent weight: Weight: 57.7 kg     Palliative Assessment/Data:     Time In: 1210 Time Out: 1320 Time Total: 70 min Greater than 50%  of this time was spent counseling and coordinating care related to the above assessment and plan.  Signed by: Vinie Sill, NP Palliative Medicine Team Pager # [phone] (M-F 8a-5p) Team Phone # [phone] (Nights/Weekends)

## 2020-09-28 DIAGNOSIS — I10 Essential (primary) hypertension: Secondary | ICD-10-CM | POA: Diagnosis not present

## 2020-09-28 DIAGNOSIS — Z978 Presence of other specified devices: Secondary | ICD-10-CM | POA: Diagnosis not present

## 2020-09-28 DIAGNOSIS — A419 Sepsis, unspecified organism: Secondary | ICD-10-CM | POA: Diagnosis not present

## 2020-09-28 DIAGNOSIS — I480 Paroxysmal atrial fibrillation: Secondary | ICD-10-CM | POA: Diagnosis not present

## 2020-09-28 LAB — COMPREHENSIVE METABOLIC PANEL
ALT: 13 U/L (ref 0–44)
AST: 13 U/L — ABNORMAL LOW (ref 15–41)
Albumin: 2.3 g/dL — ABNORMAL LOW (ref 3.5–5.0)
Alkaline Phosphatase: 107 U/L (ref 38–126)
Anion gap: 8 (ref 5–15)
BUN: 17 mg/dL (ref 8–23)
CO2: 25 mmol/L (ref 22–32)
Calcium: 8.2 mg/dL — ABNORMAL LOW (ref 8.9–10.3)
Chloride: 106 mmol/L (ref 98–111)
Creatinine, Ser: 1.15 mg/dL (ref 0.61–1.24)
GFR, Estimated: >60 mL/min (ref >60)
Glucose, Bld: 58 mg/dL — ABNORMAL LOW (ref 70–99)
Potassium: 3.2 mmol/L — ABNORMAL LOW (ref 3.5–5.1)
Sodium: 139 mmol/L (ref 135–145)
Total Bilirubin: 0.6 mg/dL (ref 0.3–1.2)
Total Protein: 5.9 g/dL — ABNORMAL LOW (ref 6.5–8.1)

## 2020-09-28 LAB — GLUCOSE, CAPILLARY
Glucose-Capillary: 144 mg/dL — ABNORMAL HIGH (ref 70–99)
Glucose-Capillary: 170 mg/dL — ABNORMAL HIGH (ref 70–99)
Glucose-Capillary: 177 mg/dL — ABNORMAL HIGH (ref 70–99)
Glucose-Capillary: 227 mg/dL — ABNORMAL HIGH (ref 70–99)
Glucose-Capillary: 29 mg/dL — CL (ref 70–99)
Glucose-Capillary: 83 mg/dL (ref 70–99)
Glucose-Capillary: 90 mg/dL (ref 70–99)

## 2020-09-28 LAB — FOLATE: Folate: 17.4 ng/mL (ref >5.9)

## 2020-09-28 LAB — IRON AND TIBC
Iron: 80 ug/dL (ref 45–182)
Saturation Ratios: 45 % — ABNORMAL HIGH (ref 17.9–39.5)
TIBC: 178 ug/dL — ABNORMAL LOW (ref 250–450)
UIBC: 98 ug/dL

## 2020-09-28 LAB — CBC WITH DIFFERENTIAL/PLATELET
Abs Immature Granulocytes: 0.06 10*3/uL (ref 0.00–0.07)
Basophils Absolute: 0 10*3/uL (ref 0.0–0.1)
Basophils Relative: 0 %
Eosinophils Absolute: 0.1 10*3/uL (ref 0.0–0.5)
Eosinophils Relative: 1 %
HCT: 23.9 % — ABNORMAL LOW (ref 39.0–52.0)
Hemoglobin: 7.7 g/dL — ABNORMAL LOW (ref 13.0–17.0)
Immature Granulocytes: 1 %
Lymphocytes Relative: 10 %
Lymphs Abs: 1.1 10*3/uL (ref 0.7–4.0)
MCH: 29.4 pg (ref 26.0–34.0)
MCHC: 32.2 g/dL (ref 30.0–36.0)
MCV: 91.2 fL (ref 80.0–100.0)
Monocytes Absolute: 0.8 10*3/uL (ref 0.1–1.0)
Monocytes Relative: 7 %
Neutro Abs: 9.1 10*3/uL — ABNORMAL HIGH (ref 1.7–7.7)
Neutrophils Relative %: 81 %
Platelets: 308 10*3/uL (ref 150–400)
RBC: 2.62 MIL/uL — ABNORMAL LOW (ref 4.22–5.81)
RDW: 17 % — ABNORMAL HIGH (ref 11.5–15.5)
WBC: 11.2 10*3/uL — ABNORMAL HIGH (ref 4.0–10.5)
nRBC: 0 % (ref 0.0–0.2)

## 2020-09-28 LAB — RETICULOCYTES
Immature Retic Fract: 5.1 % (ref 2.3–15.9)
RBC.: 2.6 MIL/uL — ABNORMAL LOW (ref 4.22–5.81)
Retic Count, Absolute: 34.3 10*3/uL (ref 19.0–186.0)
Retic Ct Pct: 1.3 % (ref 0.4–3.1)

## 2020-09-28 LAB — MAGNESIUM: Magnesium: 1.7 mg/dL (ref 1.7–2.4)

## 2020-09-28 LAB — LACTATE DEHYDROGENASE: LDH: 147 U/L (ref 98–192)

## 2020-09-28 LAB — VITAMIN B12: Vitamin B-12: 437 pg/mL (ref 180–914)

## 2020-09-28 LAB — PHOSPHORUS: Phosphorus: 3 mg/dL (ref 2.5–4.6)

## 2020-09-28 LAB — FERRITIN: Ferritin: 311 ng/mL (ref 24–336)

## 2020-09-28 MED ORDER — POTASSIUM CHLORIDE CRYS ER 20 MEQ PO TBCR
40.0000 meq | EXTENDED_RELEASE_TABLET | Freq: Once | ORAL | Status: AC
  Administered 2020-09-28: 40 meq via ORAL
  Filled 2020-09-28: qty 2

## 2020-09-28 MED ORDER — LEVOFLOXACIN IN D5W 500 MG/100ML IV SOLN
500.0000 mg | INTRAVENOUS | Status: DC
  Administered 2020-09-28: 500 mg via INTRAVENOUS
  Filled 2020-09-28: qty 100

## 2020-09-28 MED ORDER — MAGNESIUM SULFATE 2 GM/50ML IV SOLN
2.0000 g | Freq: Once | INTRAVENOUS | Status: AC
  Administered 2020-09-28: 2 g via INTRAVENOUS
  Filled 2020-09-28: qty 50

## 2020-09-28 MED ORDER — RISPERIDONE 1 MG PO TABS
0.5000 mg | ORAL_TABLET | Freq: Two times a day (BID) | ORAL | Status: DC
  Administered 2020-09-28 – 2020-09-29 (×3): 0.5 mg via ORAL
  Filled 2020-09-28 (×3): qty 1

## 2020-09-28 NOTE — Progress Notes (Signed)
Assumed care of patient. Patient does not have restraints on.

## 2020-09-28 NOTE — Progress Notes (Signed)
PT Cancellation Note  Patient Details Name: Allen Yu MRN: [id number] DOB: Jun 15, 1945   Cancelled Treatment:    Reason Eval/Treat Not Completed: Patient declined, pt states he stays in bed at home.  He does not get up and will not get up as he is afraid of falling.  He has a wheelchair at home.       Rayetta Humphrey, [place] CLT [phone]  09/28/2020, 2:34 PM

## 2020-09-28 NOTE — Progress Notes (Signed)
PROGRESS NOTE  Allen Yu [id number] DOB: 08/26/1945 DOA: 09/24/2020 PCP: [name], MD  Brief History:  75 y.o. 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   The patient is unable to give any significant history secondary to his altered mental status.  All of this history is obtained from review of medical record and speaking with the patient spouse.  Accordingly, the patient was recently discharged from the hospital after a stay from 06/28/2020 to 06/30/2020 when he underwent a left AKA on 06/28/2020 secondary to a poorly healing left heel ulceration.  His hospitalization was complicated by AKI as well as acute blood loss anemia.  He also had a self-limited episode of atrial fibrillation.  Anticoagulation was deferred secondary to his high fall risk and low A. fib burden. 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  He was discharged home with cefdinir x 10 days.  He has had an indwelling Foley catheter since June but it was changed about 3 days ago. He has a known wound to his fourth and fifth toe on the right and is being scheduled for an aortogram by vascular surgery next week.    Assessment/Plan: Severe sepsis -present on admission -lactate peaked 3.3 -presented with fever, leukocytosis and AMS -due to epididymitis and PNA -continue levoflox  Lobar Pneumonia -personally reviewed CXR 9/3--LLL opacity -continue levoflox  Epididymitis -continue levoflox -plan 14 days abx  Acute respiratory failure with hypoxia -stable on 3L  Paroxysmal Atrial Fibrillation with RVR -no longer taking amiodarone -CHADS-VASc= 4, but not clear if he is good AC candidate long term -06/29/20 Echo--EF  60-65%, no WMA, normal PASP -continue metoprolol -no longer on apixaban   Acute metabolic encephalopathy -Secondary to AKI and sepsis -increase risperdal   Acute on chronic renal failure--CKD stage IIIa -Baseline creatinine 0.9-1.2 -Secondary to volume depletion and sepsis -Continue IV fluids   Hyponatremia -Secondary to volume depletion and poor solute intake -Start IV fluids>>improving   Uncontrolled diabetes mellitus type 2 with hyperglycemia -09/24/2020 hemoglobin A1c 9.7 -NovoLog sliding scale -holding semglee due to hypoglycemia   Essential hypertension Continue metoprolol   Hyperlipidemia -Restart statin   Dementia -Restart Aricept   Hypokalemia -replete -check mag    Status is: Inpatient  Remains inpatient appropriate because:IV treatments appropriate due to intensity of illness or inability to take PO  Dispo: The patient is from: Home              Anticipated d/c is to: Home              Patient currently is not medically stable to d/c.   Difficult to place patient No        Family Communication:  spouse updated 9/7  Consultants:  palliative  Code Status:  DNR  DVT Prophylaxis:  [place] Lovenox   Procedures: As Listed in Progress Note Above  Antibiotics: Levoflox 9/5>> Cefepime 9/3>>9/5     Subjective: Denies cp, sob.  Remains confused, less agitated.  Remainder ROS not obtainable  Objective: Vitals:   09/28/20 0100 09/28/20 0400 09/28/20 0500 09/28/20 0748  BP: 125/87  (!) 143/78   Pulse:      Resp: (!) 22  16   Temp:  98 F (36.7 C)  TempSrc:  Oral    SpO2:    95%  Weight:   54.7 kg   Height:        Intake/Output Summary (Last 24 hours) at 09/28/2020 0815 Last data filed at 09/28/2020 0600 Gross per 24 hour  Intake 650.14 ml  Output 2950 ml  Net -2299.86 ml   Weight change: -3 kg Exam:  General:  Pt is alert, does not follow commands appropriately, not in acute distress HEENT: No icterus, No thrush, No neck mass,  [place]/AT Cardiovascular: RRR, S1/S2, no rubs, no gallops Respiratory: R-basilar crackles.  No wheeze Abdomen: Soft/+BS, non tender, non distended, no guarding Extremities: No edema, No lymphangitis, No petechiae, No rashes, no synovitis;  L-AKA   Data Reviewed: I have personally reviewed following labs and imaging studies Basic Metabolic Panel: Recent Labs  Lab 09/25/20 0157 09/25/20 1341 09/26/20 0408 09/27/20 0428 09/28/20 0451  NA 136 134* 137 139 139  K 2.8* 3.2* 3.2* 3.5 3.2*  CL 104 103 107 107 106  CO2 '28 23 25 23 25  '$ GLUCOSE 51* 105* 104* 222* 58*  BUN '17 17 17 18 17  '$ CREATININE 0.98 0.90 0.93 1.07 1.15  CALCIUM 7.9* 7.8* 8.2* 8.6* 8.2*  MG 1.3* 1.8 1.9 2.0 1.7  PHOS 3.1 3.2 2.7 2.6 3.0   Liver Function Tests: Recent Labs  Lab 09/25/20 0157 09/25/20 1341 09/26/20 0408 09/27/20 0428 09/28/20 0451  AST '26 25 20 18 '$ 13*  ALT '16 16 15 14 13  '$ ALKPHOS 112 112 128* 143* 107  BILITOT 1.3* 1.5* 1.1 1.0 0.6  PROT 5.6* 6.0* 5.9* 6.0* 5.9*  ALBUMIN 2.4* 2.5* 2.3* 2.3* 2.3*   No results for input(s): LIPASE, AMYLASE in the last 168 hours. No results for input(s): AMMONIA in the last 168 hours. Coagulation Profile: Recent Labs  Lab 09/24/20 0640 09/25/20 0157  INR 1.3* 1.6*   CBC: Recent Labs  Lab 09/25/20 0157 09/25/20 1341 09/26/20 0408 09/27/20 0428 09/28/20 0451  WBC 34.5* 32.0* 32.7* 22.3* 11.2*  NEUTROABS 25.0* 30.1* 29.5* 20.4* 9.1*  HGB 6.6* 8.1* 8.1* 7.8* 7.7*  HCT 20.3* 24.6* 24.6* 24.0* 23.9*  MCV 93.1 92.8 91.8 92.7 91.2  PLT 242 223 266 291 308   Cardiac Enzymes: No results for input(s): CKTOTAL, CKMB, CKMBINDEX, TROPONINI in the last 168 hours. BNP: Invalid input(s): POCBNP CBG: Recent Labs  Lab 09/27/20 1611 09/27/20 2013 09/28/20 0003 09/28/20 0428 09/28/20 0504  GLUCAP 233* 171* 83 29* 90   HbA1C: No results for input(s): HGBA1C in the last 72 hours. Urine analysis:    Component Value Date/Time   COLORURINE YELLOW 09/24/2020  0632   APPEARANCEUR CLEAR 09/24/2020 0632   LABSPEC 1.015 09/24/2020 0632   PHURINE 7.0 09/24/2020 0632   GLUCOSEU 250 (A) 09/24/2020 0632   HGBUR MODERATE (A) 09/24/2020 0632   BILIRUBINUR NEGATIVE 09/24/2020 0632   KETONESUR NEGATIVE 09/24/2020 0632   PROTEINUR 100 (A) 09/24/2020 0632   UROBILINOGEN 0.2 02/18/2014 0950   NITRITE NEGATIVE 09/24/2020 0632   LEUKOCYTESUR MODERATE (A) 09/24/2020 0632   Sepsis Labs: '@LABRCNTIP'$ (procalcitonin:4,lacticidven:4) ) Recent Results (from the past 240 hour(s))  Urine Culture     Status: Abnormal   Collection Time: 09/24/20  6:32 AM   Specimen: Urine, Clean Catch  Result Value Ref Range Status   Specimen Description   Final    URINE, CLEAN CATCH Performed at [facility], [address] [place], [place] [address]    Special Requests   Final    NONE Performed  at [facility], [address], [place], [place] [address]    Culture (A)  Final    >=100,000 COLONIES/mL PSEUDOMONAS AERUGINOSA >=100,000 COLONIES/mL KLEBSIELLA PNEUMONIAE    Report Status 09/26/2020 FINAL  Final   Organism ID, Bacteria PSEUDOMONAS AERUGINOSA (A)  Final   Organism ID, Bacteria KLEBSIELLA PNEUMONIAE (A)  Final      Susceptibility   Klebsiella pneumoniae - MIC*    AMPICILLIN >=32 RESISTANT Resistant     CEFAZOLIN 16 SENSITIVE Sensitive     CEFEPIME <=0.12 SENSITIVE Sensitive     CEFTRIAXONE <=0.25 SENSITIVE Sensitive     CIPROFLOXACIN <=0.25 SENSITIVE Sensitive     GENTAMICIN <=1 SENSITIVE Sensitive     IMIPENEM <=0.25 SENSITIVE Sensitive     NITROFURANTOIN 128 RESISTANT Resistant     TRIMETH/SULFA <=20 SENSITIVE Sensitive     AMPICILLIN/SULBACTAM >=32 RESISTANT Resistant     PIP/TAZO >=128 RESISTANT Resistant     * >=100,000 COLONIES/mL KLEBSIELLA PNEUMONIAE   Pseudomonas aeruginosa - MIC*    CEFTAZIDIME 4 SENSITIVE Sensitive     CIPROFLOXACIN 0.5 SENSITIVE Sensitive     GENTAMICIN <=1 SENSITIVE Sensitive     IMIPENEM 1 SENSITIVE Sensitive     PIP/TAZO  <=4 SENSITIVE Sensitive     CEFEPIME 2 SENSITIVE Sensitive     * >=100,000 COLONIES/mL PSEUDOMONAS AERUGINOSA  Resp Panel by RT-PCR (Flu A&B, Covid) Nasopharyngeal Swab     Status: None   Collection Time: 09/24/20  6:41 AM   Specimen: Nasopharyngeal Swab; Nasopharyngeal(NP) swabs in vial transport medium  Result Value Ref Range Status   SARS Coronavirus 2 by RT PCR NEGATIVE NEGATIVE Final    Comment: (NOTE) SARS-CoV-2 target nucleic acids are NOT DETECTED.  The SARS-CoV-2 RNA is generally detectable in upper respiratory specimens during the acute phase of infection. The lowest concentration of SARS-CoV-2 viral copies this assay can detect is 138 copies/mL. A negative result does not preclude SARS-Cov-2 infection and should not be used as the sole basis for treatment or other patient management decisions. A negative result may occur with  improper specimen collection/handling, submission of specimen other than nasopharyngeal swab, presence of viral mutation(s) within the areas targeted by this assay, and inadequate number of viral copies(<138 copies/mL). A negative result must be combined with clinical observations, patient history, and epidemiological information. The expected result is Negative.  Fact Sheet for Patients:  EntrepreneurPulse.com.au  Fact Sheet for Healthcare Providers:  IncredibleEmployment.be  This test is no t yet approved or cleared by the [place] FDA and  has been authorized for detection and/or diagnosis of SARS-CoV-2 by FDA under an Emergency Use Authorization (EUA). This EUA will remain  in effect (meaning this test can be used) for the duration of the COVID-19 declaration under Section 564(b)(1) of the Act, 21 U.S.C.section 360bbb-3(b)(1), unless the authorization is terminated  or revoked sooner.       Influenza A by PCR NEGATIVE NEGATIVE Final   Influenza B by PCR NEGATIVE NEGATIVE Final    Comment:  (NOTE) The Xpert Xpress SARS-CoV-2/FLU/RSV plus assay is intended as an aid in the diagnosis of influenza from Nasopharyngeal swab specimens and should not be used as a sole basis for treatment. Nasal washings and aspirates are unacceptable for Xpert Xpress SARS-CoV-2/FLU/RSV testing.  Fact Sheet for Patients: EntrepreneurPulse.com.au  Fact Sheet for Healthcare Providers: IncredibleEmployment.be  This test is not yet approved or cleared by the [place] FDA and has been authorized for detection and/or diagnosis of SARS-CoV-2 by FDA under an Emergency  Use Authorization (EUA). This EUA will remain in effect (meaning this test can be used) for the duration of the COVID-19 declaration under Section 564(b)(1) of the Act, 21 U.S.C. section 360bbb-3(b)(1), unless the authorization is terminated or revoked.  Performed at [facility], [address] [place], [place] [address]   Blood Culture (routine x 2)     Status: None (Preliminary result)   Collection Time: 09/24/20  6:41 AM   Specimen: BLOOD  Result Value Ref Range Status   Specimen Description BLOOD  Final   Special Requests NONE  Final   Culture   Final    NO GROWTH 4 DAYS Performed at [facility], [address], [place], [place] [address]    Report Status PENDING  Incomplete  Blood Culture (routine x 2)     Status: None (Preliminary result)   Collection Time: 09/24/20  6:42 AM   Specimen: BLOOD  Result Value Ref Range Status   Specimen Description BLOOD  Final   Special Requests NONE  Final   Culture   Final    NO GROWTH 4 DAYS Performed at [facility], [address] [place], [place] [address]    Report Status PENDING  Incomplete  MRSA Next Gen by PCR, Nasal     Status: None   Collection Time: 09/24/20 12:45 PM   Specimen: Nasal Mucosa; Nasal Swab  Result Value Ref Range Status   MRSA by PCR Next Gen NOT DETECTED NOT DETECTED Final    Comment: (NOTE) The GeneXpert MRSA Assay  (FDA approved for NASAL specimens only), is one component of a comprehensive MRSA colonization surveillance program. It is not intended to diagnose MRSA infection nor to guide or monitor treatment for MRSA infections. Test performance is not FDA approved in patients less than 69 years old. Performed at [facility], [address] [place], [place] [address]      Scheduled Meds:  Chlorhexidine Gluconate Cloth  6 each Topical Daily   donepezil  10 mg Oral QHS   enoxaparin (LOVENOX) injection  40 mg Subcutaneous Q24H   feeding supplement  237 mL Oral BID BM   ferrous sulfate  325 mg Oral Daily   finasteride  5 mg Oral Daily   folic acid  1 mg Intravenous Daily   gabapentin  100 mg Oral QHS   haloperidol lactate  1 mg Intravenous TID   insulin aspart  0-15 Units Subcutaneous Q4H   ipratropium-albuterol  3 mL Nebulization TID   metoprolol tartrate  25 mg Oral BID   mirtazapine  7.5 mg Oral QHS   multivitamin-lutein  1 capsule Oral Daily   risperiDONE  0.25 mg Oral BID   tamsulosin  0.4 mg Oral Daily   thiamine injection  100 mg Intravenous Daily   Continuous Infusions:  levofloxacin (LEVAQUIN) IV      Procedures/Studies: DG CHEST PORT 1 VIEW  Result Date: 09/27/2020 CLINICAL DATA:  Hypoxia EXAM: PORTABLE CHEST 1 VIEW COMPARISON:  09/24/2020 FINDINGS: Diffuse bilateral airspace disease. Layering bilateral effusions. Heart is borderline in size. No acute bony abnormality. IMPRESSION: New severe diffuse bilateral airspace disease with layering effusions. Findings could reflect edema or infection. Electronically Signed   By: Rolm Baptise M.D.   On: 09/27/2020 02:41   DG Chest Port 1 View  Result Date: 09/24/2020 CLINICAL DATA:  Questionable sepsis, dementia EXAM: PORTABLE CHEST 1 VIEW COMPARISON:  07/21/2020 chest radiograph. FINDINGS: Stable cardiomediastinal silhouette with normal heart size. No pneumothorax. No pleural effusion. No pulmonary edema. Mild  patchy left retrocardiac  opacity. IMPRESSION: Mild patchy left retrocardiac opacity, which could represent atelectasis, aspiration, or pneumonia. Follow-up chest radiograph suggested. Electronically Signed   By: Ilona Sorrel M.D.   On: 09/24/2020 07:24   DG Foot 2 Views Right  Result Date: 09/24/2020 CLINICAL DATA:  sepsis, fever, right foot pain in blistering, no reported injury EXAM: RIGHT FOOT - 2 VIEW COMPARISON:  None FINDINGS: Vascular calcifications throughout the soft tissues. No fracture or dislocation. No focal osseous lesions. No cortical erosions or periosteal reaction. No radiopaque foreign bodies. No significant arthropathy. IMPRESSION: No acute osseous abnormality. Vascular calcifications throughout the soft tissues, suggesting diabetes mellitus. Electronically Signed   By: Ilona Sorrel M.D.   On: 09/24/2020 07:27   VAS [place] ABI WITH/WO TBI  Result Date: 09/21/2020  LOWER EXTREMITY DOPPLER STUDY Patient Name:  JAKEEL YOU  Date of Exam:   09/21/2020 Medical Rec #: [id number]       Accession #:    [id number] Date of Birth: 1945-02-01      Patient Gender: M Patient Age:   36 years Exam Location:  [facility] Procedure:      VAS [place] ABI WITH/WO TBI Referring Phys: Aldona Bar RHYNE --------------------------------------------------------------------------------  Indications: Rest pain, and peripheral artery disease. High Risk         Hypertension, Diabetes, current smoker, coronary artery Factors:          disease.  Vascular Interventions: 06/28/2020: Left above-knee amputation. Limitations: Today's exam was limited due to patient in wheelchair. Comparison Study: 05/18/2020: Rt ABI 0.85; Lt ABI [place] Performing Technologist: Ivan Croft  Examination Guidelines: A complete evaluation includes at minimum, Doppler waveform signals and systolic blood pressure reading at the level of bilateral brachial, anterior tibial, and posterior tibial arteries, when vessel segments are accessible. Bilateral testing is  considered an integral part of a complete examination. Photoelectric Plethysmograph (PPG) waveforms and toe systolic pressure readings are included as required and additional duplex testing as needed. Limited examinations for reoccurring indications may be performed as noted.  ABI Findings: +---------+------------------+-----+----------+--------+ Right    Rt Pressure (mmHg)IndexWaveform  Comment  +---------+------------------+-----+----------+--------+ Brachial 154                                       +---------+------------------+-----+----------+--------+ PTA      184               1.17 biphasic           +---------+------------------+-----+----------+--------+ DP       173               1.10 monophasic         +---------+------------------+-----+----------+--------+ Great Toe52                0.33                    +---------+------------------+-----+----------+--------+ +--------+------------------+-----+--------+-------+ Left    Lt Pressure (mmHg)IndexWaveformComment +--------+------------------+-----+--------+-------+ [id number]                                    +--------+------------------+-----+--------+-------+ +-------+-----------------+-----------+------------+------------+ ABI/TBIToday's ABI      Today's TBIPrevious ABIPrevious TBI +-------+-----------------+-----------+------------+------------+ Right  1.17 (unreliable)0.33       0.85        0.10         +-------+-----------------+-----------+------------+------------+ Left  AKA              AKA        [place]          0            +-------+-----------------+-----------+------------+------------+  Arterial wall calcification precludes accurate ankle pressures and ABIs.  Summary: Right: The right toe-brachial index is abnormal. Although ankle brachial indices are within normal limits (0.95-1.29), arterial Doppler waveforms at the ankle suggest some component of arterial occlusive disease.  *See  table(s) above for measurements and observations.  Electronically signed by Servando Snare MD on 09/21/2020 at 1:53:09 PM.    Final    ECHOCARDIOGRAM COMPLETE  Result Date: 09/27/2020    ECHOCARDIOGRAM REPORT   Patient Name:   AAHIL GILE Date of Exam: 09/27/2020 Medical Rec #:  [id number]      Height:       73.0 in Accession #:    [id number]     Weight:       127.2 lb Date of Birth:  30-Jul-1945     BSA:          1.775 m Patient Age:    76 years       BP:           188/97 mmHg Patient Gender: M              HR:           96 bpm. Exam Location:  [facility] Procedure: 2D Echo, Cardiac Doppler and Color Doppler Indications:    CHF  History:        Patient has prior history of Echocardiogram examinations, most                 recent 06/29/2020. CHF, PVD and COPD, Arrythmias:Atrial                 Fibrillation, Signs/Symptoms:Sepsis; Risk Factors:Hypertension                 and Diabetes. PTSD, dementia, schizophrenia.  Sonographer:    Dustin Flock RDCS Referring Phys: [id number] [place]  1. Irregular heart rhythm makes accurate assessment of LVEF difficult LV systolic function is depressed with infeiror hypokinesis. COmpared to echo from June 2022 the inferior changes appear new. Left ventricular ejection fraction, by estimation, is 45%%. There is mild left ventricular hypertrophy. Left ventricular diastolic parameters are indeterminate.  2. Right ventricular systolic function is low normal. The right ventricular size is normal. There is moderately elevated pulmonary artery systolic pressure.  3. Right atrial size was mildly dilated.  4. The mitral valve is normal in structure. Mild mitral valve regurgitation.  5. The aortic valve is tricuspid. Aortic valve regurgitation is not visualized. Mild to moderate aortic valve sclerosis/calcification is present, without any evidence of aortic stenosis.  6. The inferior vena cava is dilated in size with <50% respiratory variability, suggesting right atrial  pressure of 15 mmHg. FINDINGS  Left Ventricle: Irregular heart rhythm makes accurate assessment of LVEF difficult LV systolic function is depressed with infeiror hypokinesis. COmpared to echo from June 2022 the inferior changes appear new. Left ventricular ejection fraction, by estimation, is 45%%. The left ventricular internal cavity size was normal in size. There is mild left ventricular hypertrophy. Left ventricular diastolic parameters are indeterminate. Right Ventricle: The right ventricular size is normal. Right vetricular wall thickness was not assessed. Right ventricular systolic function is low normal. There is moderately elevated pulmonary artery systolic  pressure. The tricuspid regurgitant velocity is 3.48 m/s, and with an assumed right atrial pressure of 3 mmHg, the estimated right ventricular systolic pressure is [id number] mmHg. Left Atrium: Left atrial size was normal in size. Right Atrium: Right atrial size was mildly dilated. Pericardium: Trivial pericardial effusion is present. Mitral Valve: The mitral valve is normal in structure. There is mild thickening of the mitral valve leaflet(s). Mild mitral valve regurgitation. Tricuspid Valve: The tricuspid valve is normal in structure. Tricuspid valve regurgitation is mild. Aortic Valve: The aortic valve is tricuspid. Aortic valve regurgitation is not visualized. Mild to moderate aortic valve sclerosis/calcification is present, without any evidence of aortic stenosis. Pulmonic Valve: The pulmonic valve was not well visualized. Pulmonic valve regurgitation is not visualized. No evidence of pulmonic stenosis. Aorta: The aortic root and ascending aorta are structurally normal, with no evidence of dilitation. Venous: The inferior vena cava is dilated in size with less than 50% respiratory variability, suggesting right atrial pressure of 15 mmHg. IAS/Shunts: No atrial level shunt detected by color flow Doppler.  LEFT VENTRICLE PLAX 2D LVIDd:         4.27 cm   Diastology LVIDs:         3.22 cm  LV e' medial:    7.42 cm/s LV PW:         1.38 cm  LV E/e' medial:  11.7 LV IVS:        1.35 cm  LV e' lateral:   10.10 cm/s LVOT diam:     2.20 cm  LV E/e' lateral: 8.6 LV SV:         52 LV SV Index:   29 LVOT Area:     3.80 cm  RIGHT VENTRICLE RV Basal diam:  2.87 cm RV S prime:     14.10 cm/s TAPSE (M-mode): 2.1 cm LEFT ATRIUM             Index       RIGHT ATRIUM           Index LA diam:        3.20 cm 1.80 cm/m  RA Area:     17.50 cm LA Vol (A2C):   65.1 ml 36.67 ml/m RA Volume:   54.20 ml  30.53 ml/m LA Vol (A4C):   45.0 ml 25.35 ml/m LA Biplane Vol: 54.4 ml 30.64 ml/m  AORTIC VALVE LVOT Vmax:   83.50 cm/s LVOT Vmean:  50.800 cm/s LVOT VTI:    0.137 m  AORTA Ao Root diam: 3.40 cm MITRAL VALVE               TRICUSPID VALVE MV Area (PHT): 10.99 cm   TR Peak grad:   48.4 mmHg MV Decel Time: 69 msec     TR Vmax:        348.00 cm/s MV E velocity: 86.70 cm/s MV A velocity: 80.10 cm/s  SHUNTS MV E/A ratio:  1.08        Systemic VTI:  0.14 m                            Systemic Diam: 2.20 cm Dorris Carnes MD Electronically signed by Dorris Carnes MD Signature Date/Time: 09/27/2020/10:12:23 PM    Final    US SCROTUM W/DOPPLER  Result Date: 09/24/2020 CLINICAL DATA:  Mass of testicle. EXAM: SCROTAL ULTRASOUND DOPPLER ULTRASOUND OF THE TESTICLES TECHNIQUE: Complete ultrasound examination of the testicles, epididymis, and other scrotal structures was  performed. Color and spectral Doppler ultrasound were also utilized to evaluate blood flow to the testicles. COMPARISON:  Abdominopelvic CT 07/21/2020 FINDINGS: Right testicle Measurements: 4.3 x 2.8 x 3.2 cm. Homogeneous echogenicity. Normal blood flow. No testicular mass or microlithiasis visualized. Left testicle Measurements: 3.3 x 2.2 x 3.3 cm. Homogeneous echogenicity. Normal blood flow. No mass or microlithiasis visualized. Right epididymis: Heterogeneous and hyperemic. No focal lesion or fluid collection. Left epididymis:  Normal in  size and appearance. Hydrocele:  Small to moderate on the right. Varicocele:  None visualized. Pulsed Doppler interrogation of both testes demonstrates normal low resistance arterial and venous waveforms bilaterally. Other: Heterogeneity of the intrascrotal fat on the right. There is a possible fat containing right inguinal hernia, not well-defined by ultrasound. IMPRESSION: 1. Heterogeneous right epididymis with increased vascularity suspicious for epididymitis. 2. Heterogeneous appearance of the intrascrotal fat in the right with possible fat containing inguinal hernia. This is not definite, and not well assessed by ultrasound. 3. Small to moderate right hydrocele. 4. Normal sonographic appearance of both testicles without intratesticular mass. Electronically Signed   By: Keith Rake M.D.   On: 09/24/2020 17:18    Orson Eva, DO  [facility]  If 7PM-7AM, please contact night-coverage [email or web address] Password [email or web address] 09/28/2020, 8:15 AM   LOS: 4 days

## 2020-09-28 NOTE — Progress Notes (Signed)
Patients blood sugar low this morning at 0400 blood sugar check. Patient alert at baseline and able to tolerate PO fluids. Juice given will recheck blood sugar

## 2020-09-29 DIAGNOSIS — I480 Paroxysmal atrial fibrillation: Secondary | ICD-10-CM | POA: Diagnosis not present

## 2020-09-29 DIAGNOSIS — Z89612 Acquired absence of left leg above knee: Secondary | ICD-10-CM | POA: Diagnosis not present

## 2020-09-29 DIAGNOSIS — A419 Sepsis, unspecified organism: Secondary | ICD-10-CM | POA: Diagnosis not present

## 2020-09-29 DIAGNOSIS — Z515 Encounter for palliative care: Secondary | ICD-10-CM | POA: Diagnosis not present

## 2020-09-29 DIAGNOSIS — Z978 Presence of other specified devices: Secondary | ICD-10-CM | POA: Diagnosis not present

## 2020-09-29 DIAGNOSIS — F0150 Vascular dementia without behavioral disturbance: Secondary | ICD-10-CM | POA: Diagnosis not present

## 2020-09-29 LAB — CULTURE, BLOOD (ROUTINE X 2): Culture: NO GROWTH

## 2020-09-29 LAB — CBC WITH DIFFERENTIAL/PLATELET
Abs Immature Granulocytes: 0.06 10*3/uL (ref 0.00–0.07)
Basophils Absolute: 0 10*3/uL (ref 0.0–0.1)
Basophils Relative: 1 %
Eosinophils Absolute: 0.2 10*3/uL (ref 0.0–0.5)
Eosinophils Relative: 2 %
HCT: 22.5 % — ABNORMAL LOW (ref 39.0–52.0)
Hemoglobin: 7.4 g/dL — ABNORMAL LOW (ref 13.0–17.0)
Immature Granulocytes: 1 %
Lymphocytes Relative: 14 %
Lymphs Abs: 1.2 10*3/uL (ref 0.7–4.0)
MCH: 30 pg (ref 26.0–34.0)
MCHC: 32.9 g/dL (ref 30.0–36.0)
MCV: 91.1 fL (ref 80.0–100.0)
Monocytes Absolute: 0.8 10*3/uL (ref 0.1–1.0)
Monocytes Relative: 9 %
Neutro Abs: 6.3 10*3/uL (ref 1.7–7.7)
Neutrophils Relative %: 73 %
Platelets: 323 10*3/uL (ref 150–400)
RBC: 2.47 MIL/uL — ABNORMAL LOW (ref 4.22–5.81)
RDW: 16.7 % — ABNORMAL HIGH (ref 11.5–15.5)
WBC: 8.6 10*3/uL (ref 4.0–10.5)
nRBC: 0 % (ref 0.0–0.2)

## 2020-09-29 LAB — COMPREHENSIVE METABOLIC PANEL
ALT: 18 U/L (ref 0–44)
AST: 27 U/L (ref 15–41)
Albumin: 2.3 g/dL — ABNORMAL LOW (ref 3.5–5.0)
Alkaline Phosphatase: 138 U/L — ABNORMAL HIGH (ref 38–126)
Anion gap: 8 (ref 5–15)
BUN: 24 mg/dL — ABNORMAL HIGH (ref 8–23)
CO2: 26 mmol/L (ref 22–32)
Calcium: 8.3 mg/dL — ABNORMAL LOW (ref 8.9–10.3)
Chloride: 102 mmol/L (ref 98–111)
Creatinine, Ser: 1.22 mg/dL (ref 0.61–1.24)
GFR, Estimated: >60 mL/min (ref >60)
Glucose, Bld: 315 mg/dL — ABNORMAL HIGH (ref 70–99)
Potassium: 4.1 mmol/L (ref 3.5–5.1)
Sodium: 136 mmol/L (ref 135–145)
Total Bilirubin: 0.3 mg/dL (ref 0.3–1.2)
Total Protein: 5.4 g/dL — ABNORMAL LOW (ref 6.5–8.1)

## 2020-09-29 LAB — GLUCOSE, CAPILLARY
Glucose-Capillary: 285 mg/dL — ABNORMAL HIGH (ref 70–99)
Glucose-Capillary: 316 mg/dL — ABNORMAL HIGH (ref 70–99)
Glucose-Capillary: 379 mg/dL — ABNORMAL HIGH (ref 70–99)

## 2020-09-29 LAB — PHOSPHORUS: Phosphorus: 3.1 mg/dL (ref 2.5–4.6)

## 2020-09-29 LAB — HAPTOGLOBIN: Haptoglobin: 185 mg/dL (ref 34–355)

## 2020-09-29 LAB — MAGNESIUM: Magnesium: 1.8 mg/dL (ref 1.7–2.4)

## 2020-09-29 MED ORDER — LEVOFLOXACIN 500 MG PO TABS: 500.0000 mg | ORAL_TABLET | Freq: Every day | ORAL | 0 refills | Status: DC

## 2020-09-29 MED ORDER — LEVOFLOXACIN 500 MG PO TABS
500.0000 mg | ORAL_TABLET | Freq: Every day | ORAL | Status: DC
  Administered 2020-09-29: 500 mg via ORAL
  Filled 2020-09-29: qty 1

## 2020-09-29 MED ORDER — RISPERIDONE 0.5 MG PO TABS: 0.5000 mg | ORAL_TABLET | Freq: Two times a day (BID) | ORAL | 0 refills | Status: DC

## 2020-09-29 NOTE — Progress Notes (Signed)
Patient refused to have CBG done states " They done already stuck me today ", Educated patient that was the lab that drew blood and a finger stick was not in the vein. Patient still declined to have CBG at this time.

## 2020-09-29 NOTE — TOC Transition Note (Signed)
Transition of Care [facility]) - CM/SW Discharge Note   Patient Details  Name: RISTON SCHULD MRN: [id number] Date of Birth: Feb 18, 1945  [facility] of [facility] CM/SW Contact:  Natasha Bence, LCSW Phone Number: 09/29/2020, 12:08 PM   Clinical Narrative:    CSW notified of patient's readiness for discharge. CSW notified Vaughan Basta with [facility] of patient's discharge. [place] agreeable to provide [facility] services. CSW notified of patient's need for O2. CSW referred patient to [place]. Ashly with [employer] agreeable to provide O2. Patient's family agreeable to provide transportation. TOC signing off.   Final next level of care: [place] Barriers to Discharge: Barriers Resolved   Patient Goals and CMS Choice Patient states their goals for this hospitalization and ongoing recovery are:: Return home with [facility] CMS Medicare.gov Compare Post Acute Care list provided to:: Patient Choice offered to / list presented to : Patient  Discharge Placement                Patient to be transferred to facility by: [place]   Patient and family notified of of transfer: 09/29/20  Discharge Plan and Services     Post Acute Care Choice: Home Health            DME Agency: [facility] Date DME Agency Contacted: 09/29/20 Time DME Agency Contacted: 1203 Representative spoke with at DME Agency: [place]: PT, OT, RN, Nurse's Aide [place] Agency: [place] ([place]) Date HH Agency Contacted: 09/29/20 Time [place]: 1204 Representative spoke with at [place]: [place] ([place]) Interventions     Readmission Risk Interventions Readmission Risk Prevention Plan 09/26/2020 07/26/2020 06/30/2020  Post Dischage Appt - - Complete  Medication Screening - - Complete  Transportation Screening Complete Complete Complete  PCP or Specialist Appt within 3-5 Days - Complete -  HRI or [place] - Complete -  Social Work Consult for [place]  Planning/Counseling - Complete -  Palliative Care Screening - Complete -  Medication Review Press photographer) Complete Complete -  [place] or Home Care Consult Complete - -  SW Recovery Care/Counseling Consult Complete - -  Palliative Care Screening Not Complete - -  Comments Referred to [facility] Palliative in July - -  [place] Not Complete - -  SNF Comments Refusing - -  Some recent data might be hidden

## 2020-09-29 NOTE — Evaluation (Signed)
Physical Therapy Evaluation Only Patient Details Name: Allen Yu MRN: [id number] DOB: March 21, 1945 Today's Date: 09/29/2020   History of Present Illness  Pt is a 75 y.o. male presenting with altered mental status, decreased oral intake, and agitation. 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 No family present; LPN and NP states pt's spouse assists him as needed, he mobilizes around the home in w/c and also has aides coming into the home to assist. Pt with moments of agitation while in hospital, recommending return home with continued HHPT at home and 24/7 assist with benefit of being in familiar environment with familiar caregivers.    Follow Up Recommendations Home health PT;Supervision/Assistance - 24 hour    Equipment Recommendations  None recommended by PT    Recommendations for Other Services       Precautions / Restrictions Precautions Precautions: Fall Restrictions Weight Bearing Restrictions: No      Mobility  Bed Mobility Overal bed mobility: Needs Assistance Bed Mobility: Supine to Sit;Sit to Supine  Supine to sit: Min guard;Min assist Sit to supine: Min guard   General bed mobility comments: increased time, use of bedrail to upright trunk into sitting, prefers to pull on therapist's hand to upright self into sitting    Transfers  General transfer comment: pt declines  Ambulation/Gait   Stairs            Wheelchair Mobility    Modified Rankin (Stroke Patients Only)       Balance Overall balance assessment: Needs assistance Sitting-balance support: Feet supported Sitting balance-Leahy Scale: Fair Sitting balance - Comments: seated EOB, posterior pelvic tilt with  slouched posture and single UE resting on EOB        Pertinent Vitals/Pain Pain Assessment: Faces Faces Pain Scale: Hurts a little bit Pain Location: R foot Pain Descriptors / Indicators: Grimacing Pain Intervention(s): Limited activity within patient's tolerance;Monitored during session    Home Living Family/patient expects to be discharged to:: Private residence Living Arrangements: Spouse/significant other Available Help at Discharge: Family;Personal care attendant;Available 24 hours/day Type of Home: House Home Access: Ramped entrance (foldable ramp)     Home Layout: Two level;Able to live on main level with bedroom/bathroom;Other (Comment) (has man cave upstairs) Home Equipment: Bedside commode;Walker - 2 wheels;Cane - single point;Wheelchair - manual Additional Comments: all information obtained from chart review, pt unable to answer questions    Prior Function Level of Independence: Needs assistance   Gait / Transfers Assistance Needed: uses w/c in the home  ADL's / Homemaking Assistance Needed: spouse/aide assists as needed  Comments: all information obtained from chart review, pt unable to answer questions     Hand Dominance        Extremity/Trunk Assessment        Lower Extremity Assessment Lower Extremity Assessment: RLE deficits/detail;LLE deficits/detail RLE Deficits / Details: ankle AROM WNL, knee lacking ~15 deg extension, hip abd/add in bed and pushes through foot to assist in scooting up in bed LLE Deficits / Details: L AKA    Cervical / Trunk Assessment Cervical / Trunk Assessment: Kyphotic  Communication   Communication: HOH  Cognition Arousal/Alertness: Awake/alert Behavior During Therapy: WFL for tasks assessed/performed Overall Cognitive Status: History of cognitive impairments - at baseline  General Comments: Pt with  dementia and very HOH, responds to name appropriately, reports he is "going home today", unable to answer a&o questions. No  family present, information obtained from chart review      General Comments      Exercises     Assessment/Plan    PT Assessment All further PT needs can be met in the next venue of care  PT Problem List Decreased strength;Decreased range of motion;Decreased activity tolerance;Decreased balance;Decreased mobility;Decreased cognition;Decreased knowledge of use of DME;Decreased safety awareness;Decreased skin integrity       PT Treatment Interventions      PT Goals (Current goals can be found in the Care Plan section)  Acute Rehab PT Goals Patient Stated Goal: "going home today" PT Goal Formulation: With patient Time For Goal Achievement: 10/13/20 Potential to Achieve Goals: Good    Frequency     Barriers to discharge        Co-evaluation               AM-PAC PT "6 Clicks" Mobility  Outcome Measure Help needed turning from your back to your side while in a flat bed without using bedrails?: A Little Help needed moving from lying on your back to sitting on the side of a flat bed without using bedrails?: A Little Help needed moving to and from a bed to a chair (including a wheelchair)?: A Lot Help needed standing up from a chair using your arms (e.g., wheelchair or bedside chair)?: A Lot Help needed to walk in hospital room?: Total Help needed climbing 3-5 steps with a railing? : Total 6 Click Score: 12    End of Session   Activity Tolerance: Patient tolerated treatment well Patient left: in bed;with call bell/phone within reach;with bed alarm set;with nursing/sitter in room Nurse Communication: Mobility status PT Visit Diagnosis: Other abnormalities of gait and mobility (R26.89);Muscle weakness (generalized) (M62.81)    Time: 1050-1100 PT Time Calculation (min) (ACUTE ONLY): 10 min   Charges:   PT Evaluation $PT Eval Low Complexity: 1 Low           Tori Lorine Iannaccone PT, DPT 09/29/20, 11:12 AM

## 2020-09-29 NOTE — Discharge Summary (Signed)
Physician Discharge Summary  Allen Yu [id number] DOB: [birth year] DOA: 09/24/2020  PCP: [name], MD  Admit date: 09/24/2020 Discharge date: 09/29/2020  Admitted From: Home Disposition:  Home   Recommendations for Outpatient Follow-up:  Follow up with PCP in 1-2 weeks Please obtain BMP/CBC in one week   Home Health: [facility]. HHRN Equipment/Devices: 2L [place]  Discharge Condition: Stable CODE STATUS: DNR Diet recommendation: Heart Healthy / Carb Modified    Brief/Interim Summary: 75 y.o. 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   The patient is unable to give any significant history secondary to his altered mental status.  All of this history is obtained from review of medical record and speaking with the patient spouse.  Accordingly, the patient was recently discharged from the hospital after a stay from 06/28/2020 to 06/30/2020 when he underwent a left AKA on 06/28/2020 secondary to a poorly healing left heel ulceration.  His hospitalization was complicated by AKI as well as acute blood loss anemia.  He also had a self-limited episode of atrial fibrillation.  Anticoagulation was deferred secondary to his high fall risk and low A. fib burden. 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  He was discharged home with cefdinir x 10 days.   He has had an indwelling Foley catheter since June but it was changed about 3 days prior to admission. He has a known wound to his fourth and fifth toe on the right and is being scheduled for an aortogram by vascular surgery next week.   Patient was started on IV and IV abx.  He improved clinically and sepsis physiology resolved.  He remained confused with episodes of   hospital delirium requiring restraints.  Ultimately, with tx of his sepsis and adjustment of his meds, his mental status improved close to baseline.  Discharge Diagnoses:  Severe sepsis -present on admission -lactate peaked 3.3 -presented with fever, leukocytosis and AMS -due to epididymitis and PNA -initially started on cefepime -continue levoflox -sepsis physiology resolved   Lobar Pneumonia -personally reviewed CXR 9/3--LLL opacity -continue levoflox   Epididymitis -continue levoflox -plan 14 days abx -urine culture = Klebsiella PNA and Pseudomonas   Acute respiratory failure with hypoxia -stable on 2L -patient desaturated to 86% on room air with activity>>he will be set up with home oxygen at 2L   Paroxysmal Atrial Fibrillation with RVR -no longer taking amiodarone -CHADS-VASc= 4, but not clear if he is good AC candidate long term -06/29/20 Echo--EF 60-65%, no WMA, normal PASP -continue metoprolol -no longer on apixaban   Acute metabolic encephalopathy -Secondary to AKI and sepsis -increase risperdal to 0.5 mg bid -overall improved and less agitated at time of d/c   Acute on chronic renal failure--CKD stage IIIa -Baseline creatinine 0.9-1.2 -Secondary to volume depletion and sepsis -Continue IV fluids -serum creatinine 1.22 at time of d/c   Hyponatremia -Secondary to volume depletion and poor solute intake -Start IV fluids>>improved   Uncontrolled diabetes mellitus type 2 with hyperglycemia -09/24/2020 hemoglobin A1c 9.7 -NovoLog sliding scale -holding semglee due to hypoglycemia initially   Essential hypertension Continue metoprolol    Hyperlipidemia -Restart statin   Dementia -Restart Aricept   Hypokalemia -replete -check mag 1.8   Discharge Instructions   Allergies as of 09/29/2020  Reactions   Bee Venom Anaphylaxis   Codeine Other (See Comments)   incoherent  Other reaction(s): Delirium   Propoxyphene Other (See Comments)    Dizziness, "Makes me feel drunk" Other reaction(s): Dizziness   Valsartan Other (See Comments)   incoherent Other reaction(s): Delirium        Medication List     STOP taking these medications    amiodarone 200 MG tablet Commonly known as: PACERONE   cefdinir 300 MG capsule Commonly known as: OMNICEF   HYDROcodone-acetaminophen 5-325 MG tablet Commonly known as: NORCO/VICODIN   oxyCODONE-acetaminophen 5-325 MG tablet Commonly known as: Percocet   potassium chloride SA 20 MEQ tablet Commonly known as: KLOR-CON       TAKE these medications    acetaminophen 500 MG tablet Commonly known as: TYLENOL Take 1,000 mg by mouth every 6 (six) hours as needed for moderate pain.   atorvastatin 40 MG tablet Commonly known as: LIPITOR Take 1 tablet (40 mg total) by mouth at bedtime.   B-D UF III MINI PEN NEEDLES 31G X 5 MM Misc Generic drug: Insulin Pen Needle SMARTSIG:1 Each SUB-Q Daily   donepezil 10 MG tablet Commonly known as: ARICEPT Take 10 mg by mouth at bedtime.   ferrous sulfate 325 (65 FE) MG tablet Take 325 mg by mouth daily.   finasteride 5 MG tablet Commonly known as: PROSCAR Take 5 mg by mouth daily.   gabapentin 100 MG capsule Commonly known as: NEURONTIN Take 100 mg by mouth at bedtime.   Lantus 100 UNIT/ML injection Generic drug: insulin glargine Inject 0.1 mLs (10 Units total) into the skin at bedtime as needed (High blood glucose). What changed:  how much to take when to take this   levofloxacin 500 MG tablet Commonly known as: LEVAQUIN Take 1 tablet (500 mg total) by mouth daily.   metoprolol tartrate 25 MG tablet Commonly known as: LOPRESSOR Take 1 tablet (25 mg total) by mouth 2 (two) times daily.   mirtazapine 7.5 MG tablet Commonly known as: REMERON Take 7.5 mg by mouth at bedtime.   NEOSPORIN EX Apply 1 application topically daily as needed (wound care).   nicotine 21 mg/24hr patch Commonly known as: NICODERM CQ - dosed in  mg/24 hours Place 1 patch (21 mg total) onto the skin daily as needed (Cessation of smoking).   risperiDONE 0.5 MG tablet Commonly known as: RISPERDAL Take 1 tablet (0.5 mg total) by mouth 2 (two) times daily. What changed:  medication strength how much to take   tamsulosin 0.4 MG Caps capsule Commonly known as: FLOMAX Take 1 capsule (0.4 mg total) by mouth daily.   traMADol 50 MG tablet Commonly known as: ULTRAM Take 50 mg by mouth every 6 (six) hours as needed for moderate pain.               Durable Medical Equipment  (From admission, onward)           Start     Ordered   09/29/20 1054  For home use only DME oxygen  Once       Question Answer Comment  Length of Need 6 Months   Mode or (Route) Nasal cannula   Liters per Minute 2   Frequency Continuous (stationary and portable oxygen unit needed)   Oxygen conserving device Yes   Oxygen delivery system Gas      09/29/20 1053   09/29/20 1046  For home use only DME oxygen  Once  Question Answer Comment  Length of Need 6 Months   Mode or (Route) Nasal cannula   Liters per Minute 2   Frequency Continuous (stationary and portable oxygen unit needed)   Oxygen conserving device Yes   Oxygen delivery system Gas      09/29/20 1046            Allergies  Allergen Reactions   Bee Venom Anaphylaxis   Codeine Other (See Comments)    incoherent  Other reaction(s): Delirium   Propoxyphene Other (See Comments)    Dizziness, "Makes me feel drunk" Other reaction(s): Dizziness   Valsartan Other (See Comments)    incoherent Other reaction(s): Delirium    Consultations: none   Procedures/Studies: DG CHEST PORT 1 VIEW  Result Date: 09/27/2020 CLINICAL DATA:  Hypoxia EXAM: PORTABLE CHEST 1 VIEW COMPARISON:  09/24/2020 FINDINGS: Diffuse bilateral airspace disease. Layering bilateral effusions. Heart is borderline in size. No acute bony abnormality. IMPRESSION: New severe diffuse bilateral airspace disease  with layering effusions. Findings could reflect edema or infection. Electronically Signed   By: Rolm Baptise M.D.   On: 09/27/2020 02:41   DG Chest Port 1 View  Result Date: 09/24/2020 CLINICAL DATA:  Questionable sepsis, dementia EXAM: PORTABLE CHEST 1 VIEW COMPARISON:  07/21/2020 chest radiograph. FINDINGS: Stable cardiomediastinal silhouette with normal heart size. No pneumothorax. No pleural effusion. No pulmonary edema. Mild patchy left retrocardiac opacity. IMPRESSION: Mild patchy left retrocardiac opacity, which could represent atelectasis, aspiration, or pneumonia. Follow-up chest radiograph suggested. Electronically Signed   By: Ilona Sorrel M.D.   On: 09/24/2020 07:24   DG Foot 2 Views Right  Result Date: 09/24/2020 CLINICAL DATA:  sepsis, fever, right foot pain in blistering, no reported injury EXAM: RIGHT FOOT - 2 VIEW COMPARISON:  None FINDINGS: Vascular calcifications throughout the soft tissues. No fracture or dislocation. No focal osseous lesions. No cortical erosions or periosteal reaction. No radiopaque foreign bodies. No significant arthropathy. IMPRESSION: No acute osseous abnormality. Vascular calcifications throughout the soft tissues, suggesting diabetes mellitus. Electronically Signed   By: Ilona Sorrel M.D.   On: 09/24/2020 07:27   VAS [place] ABI WITH/WO TBI  Result Date: 09/21/2020  LOWER EXTREMITY DOPPLER STUDY Patient Name:  Allen Yu  Date of Exam:   09/21/2020 Medical Rec #: [id number]       Accession #:    [id number] Date of Birth: Aug 06, 1945      Patient Gender: M Patient Age:   34 years Exam Location:  [facility] Procedure:      VAS [place] ABI WITH/WO TBI Referring Phys: Aldona Bar RHYNE --------------------------------------------------------------------------------  Indications: Rest pain, and peripheral artery disease. High Risk         Hypertension, Diabetes, current smoker, coronary artery Factors:          disease.  Vascular Interventions: 06/28/2020: Left  above-knee amputation. Limitations: Today's exam was limited due to patient in wheelchair. Comparison Study: 05/18/2020: Rt ABI 0.85; Lt ABI [place] Performing Technologist: Ivan Croft  Examination Guidelines: A complete evaluation includes at minimum, Doppler waveform signals and systolic blood pressure reading at the level of bilateral brachial, anterior tibial, and posterior tibial arteries, when vessel segments are accessible. Bilateral testing is considered an integral part of a complete examination. Photoelectric Plethysmograph (PPG) waveforms and toe systolic pressure readings are included as required and additional duplex testing as needed. Limited examinations for reoccurring indications may be performed as noted.  ABI Findings: +---------+------------------+-----+----------+--------+ Right    Rt Pressure (mmHg)IndexWaveform  Comment  +---------+------------------+-----+----------+--------+  Brachial 154                                       +---------+------------------+-----+----------+--------+ PTA      184               1.17 biphasic           +---------+------------------+-----+----------+--------+ DP       173               1.10 monophasic         +---------+------------------+-----+----------+--------+ Great Toe52                0.33                    +---------+------------------+-----+----------+--------+ +--------+------------------+-----+--------+-------+ Left    Lt Pressure (mmHg)IndexWaveformComment +--------+------------------+-----+--------+-------+ [id number]                                    +--------+------------------+-----+--------+-------+ +-------+-----------------+-----------+------------+------------+ ABI/TBIToday's ABI      Today's TBIPrevious ABIPrevious TBI +-------+-----------------+-----------+------------+------------+ Right  1.17 (unreliable)0.33       0.85        0.10          +-------+-----------------+-----------+------------+------------+ Left   AKA              AKA        [place]          0            +-------+-----------------+-----------+------------+------------+  Arterial wall calcification precludes accurate ankle pressures and ABIs.  Summary: Right: The right toe-brachial index is abnormal. Although ankle brachial indices are within normal limits (0.95-1.29), arterial Doppler waveforms at the ankle suggest some component of arterial occlusive disease.  *See table(s) above for measurements and observations.  Electronically signed by Servando Snare MD on 09/21/2020 at 1:53:09 PM.    Final    ECHOCARDIOGRAM COMPLETE  Result Date: 09/27/2020    ECHOCARDIOGRAM REPORT   Patient Name:   Allen Yu Date of Exam: 09/27/2020 Medical Rec #:  [id number]      Height:       73.0 in Accession #:    [id number]     Weight:       127.2 lb Date of Birth:  04-01-1945     BSA:          1.775 m Patient Age:    89 years       BP:           188/97 mmHg Patient Gender: M              HR:           96 bpm. Exam Location:  [facility] Procedure: 2D Echo, Cardiac Doppler and Color Doppler Indications:    CHF  History:        Patient has prior history of Echocardiogram examinations, most                 recent 06/29/2020. CHF, PVD and COPD, Arrythmias:Atrial                 Fibrillation, Signs/Symptoms:Sepsis; Risk Factors:Hypertension                 and Diabetes. PTSD, dementia, schizophrenia.  Sonographer:    Dustin Flock RDCS Referring Phys: [id number] [place] J  WOODS IMPRESSIONS  1. Irregular heart rhythm makes accurate assessment of LVEF difficult LV systolic function is depressed with infeiror hypokinesis. COmpared to echo from June 2022 the inferior changes appear new. Left ventricular ejection fraction, by estimation, is 45%%. There is mild left ventricular hypertrophy. Left ventricular diastolic parameters are indeterminate.  2. Right ventricular systolic function is low normal. The right  ventricular size is normal. There is moderately elevated pulmonary artery systolic pressure.  3. Right atrial size was mildly dilated.  4. The mitral valve is normal in structure. Mild mitral valve regurgitation.  5. The aortic valve is tricuspid. Aortic valve regurgitation is not visualized. Mild to moderate aortic valve sclerosis/calcification is present, without any evidence of aortic stenosis.  6. The inferior vena cava is dilated in size with <50% respiratory variability, suggesting right atrial pressure of 15 mmHg. FINDINGS  Left Ventricle: Irregular heart rhythm makes accurate assessment of LVEF difficult LV systolic function is depressed with infeiror hypokinesis. COmpared to echo from June 2022 the inferior changes appear new. Left ventricular ejection fraction, by estimation, is 45%%. The left ventricular internal cavity size was normal in size. There is mild left ventricular hypertrophy. Left ventricular diastolic parameters are indeterminate. Right Ventricle: The right ventricular size is normal. Right vetricular wall thickness was not assessed. Right ventricular systolic function is low normal. There is moderately elevated pulmonary artery systolic pressure. The tricuspid regurgitant velocity is 3.48 m/s, and with an assumed right atrial pressure of 3 mmHg, the estimated right ventricular systolic pressure is [id number] mmHg. Left Atrium: Left atrial size was normal in size. Right Atrium: Right atrial size was mildly dilated. Pericardium: Trivial pericardial effusion is present. Mitral Valve: The mitral valve is normal in structure. There is mild thickening of the mitral valve leaflet(s). Mild mitral valve regurgitation. Tricuspid Valve: The tricuspid valve is normal in structure. Tricuspid valve regurgitation is mild. Aortic Valve: The aortic valve is tricuspid. Aortic valve regurgitation is not visualized. Mild to moderate aortic valve sclerosis/calcification is present, without any evidence of aortic  stenosis. Pulmonic Valve: The pulmonic valve was not well visualized. Pulmonic valve regurgitation is not visualized. No evidence of pulmonic stenosis. Aorta: The aortic root and ascending aorta are structurally normal, with no evidence of dilitation. Venous: The inferior vena cava is dilated in size with less than 50% respiratory variability, suggesting right atrial pressure of 15 mmHg. IAS/Shunts: No atrial level shunt detected by color flow Doppler.  LEFT VENTRICLE PLAX 2D LVIDd:         4.27 cm  Diastology LVIDs:         3.22 cm  LV e' medial:    7.42 cm/s LV PW:         1.38 cm  LV E/e' medial:  11.7 LV IVS:        1.35 cm  LV e' lateral:   10.10 cm/s LVOT diam:     2.20 cm  LV E/e' lateral: 8.6 LV SV:         52 LV SV Index:   29 LVOT Area:     3.80 cm  RIGHT VENTRICLE RV Basal diam:  2.87 cm RV S prime:     14.10 cm/s TAPSE (M-mode): 2.1 cm LEFT ATRIUM             Index       RIGHT ATRIUM           Index LA diam:        3.20 cm 1.80 cm/m  RA Area:     17.50 cm LA Vol (A2C):   65.1 ml 36.67 ml/m RA Volume:   54.20 ml  30.53 ml/m LA Vol (A4C):   45.0 ml 25.35 ml/m LA Biplane Vol: 54.4 ml 30.64 ml/m  AORTIC VALVE LVOT Vmax:   83.50 cm/s LVOT Vmean:  50.800 cm/s LVOT VTI:    0.137 m  AORTA Ao Root diam: 3.40 cm MITRAL VALVE               TRICUSPID VALVE MV Area (PHT): 10.99 cm   TR Peak grad:   48.4 mmHg MV Decel Time: 69 msec     TR Vmax:        348.00 cm/s MV E velocity: 86.70 cm/s MV A velocity: 80.10 cm/s  SHUNTS MV E/A ratio:  1.08        Systemic VTI:  0.14 m                            Systemic Diam: 2.20 cm Dorris Carnes MD Electronically signed by Dorris Carnes MD Signature Date/Time: 09/27/2020/10:12:23 PM    Final    US SCROTUM W/DOPPLER  Result Date: 09/24/2020 CLINICAL DATA:  Mass of testicle. EXAM: SCROTAL ULTRASOUND DOPPLER ULTRASOUND OF THE TESTICLES TECHNIQUE: Complete ultrasound examination of the testicles, epididymis, and other scrotal structures was performed. Color and spectral Doppler  ultrasound were also utilized to evaluate blood flow to the testicles. COMPARISON:  Abdominopelvic CT 07/21/2020 FINDINGS: Right testicle Measurements: 4.3 x 2.8 x 3.2 cm. Homogeneous echogenicity. Normal blood flow. No testicular mass or microlithiasis visualized. Left testicle Measurements: 3.3 x 2.2 x 3.3 cm. Homogeneous echogenicity. Normal blood flow. No mass or microlithiasis visualized. Right epididymis: Heterogeneous and hyperemic. No focal lesion or fluid collection. Left epididymis:  Normal in size and appearance. Hydrocele:  Small to moderate on the right. Varicocele:  None visualized. Pulsed Doppler interrogation of both testes demonstrates normal low resistance arterial and venous waveforms bilaterally. Other: Heterogeneity of the intrascrotal fat on the right. There is a possible fat containing right inguinal hernia, not well-defined by ultrasound. IMPRESSION: 1. Heterogeneous right epididymis with increased vascularity suspicious for epididymitis. 2. Heterogeneous appearance of the intrascrotal fat in the right with possible fat containing inguinal hernia. This is not definite, and not well assessed by ultrasound. 3. Small to moderate right hydrocele. 4. Normal sonographic appearance of both testicles without intratesticular mass. Electronically Signed   By: Keith Rake M.D.   On: 09/24/2020 17:18        Discharge Exam: Vitals:   09/28/20 2043 09/29/20 0303  BP:  (!) 161/92  Pulse:  97  Resp:  19  Temp:  97.8 F (36.6 C)  SpO2: 95% 91%   Vitals:   09/28/20 2017 09/28/20 2043 09/29/20 0303 09/29/20 0500  BP: (!) 161/78  (!) 161/92   Pulse: 99  97   Resp:   19   Temp: 97.6 F (36.4 C)  97.8 F (36.6 C)   TempSrc: Oral  Oral   SpO2: 94% 95% 91%   Weight:    54.6 kg  Height:        General: Pt is alert, awake, not in acute distress Cardiovascular: RRR, S1/S2 +, no rubs, no gallops Respiratory: bibasilar rales, R>L, no wheeze Abdominal: Soft, NT, ND, bowel sounds  + Extremities: no edema, no cyanosis   The results of significant diagnostics from this hospitalization (including imaging, microbiology, ancillary and laboratory) are listed  below for reference.    Significant Diagnostic Studies: DG CHEST PORT 1 VIEW  Result Date: 09/27/2020 CLINICAL DATA:  Hypoxia EXAM: PORTABLE CHEST 1 VIEW COMPARISON:  09/24/2020 FINDINGS: Diffuse bilateral airspace disease. Layering bilateral effusions. Heart is borderline in size. No acute bony abnormality. IMPRESSION: New severe diffuse bilateral airspace disease with layering effusions. Findings could reflect edema or infection. Electronically Signed   By: Rolm Baptise M.D.   On: 09/27/2020 02:41   DG Chest Port 1 View  Result Date: 09/24/2020 CLINICAL DATA:  Questionable sepsis, dementia EXAM: PORTABLE CHEST 1 VIEW COMPARISON:  07/21/2020 chest radiograph. FINDINGS: Stable cardiomediastinal silhouette with normal heart size. No pneumothorax. No pleural effusion. No pulmonary edema. Mild patchy left retrocardiac opacity. IMPRESSION: Mild patchy left retrocardiac opacity, which could represent atelectasis, aspiration, or pneumonia. Follow-up chest radiograph suggested. Electronically Signed   By: Ilona Sorrel M.D.   On: 09/24/2020 07:24   DG Foot 2 Views Right  Result Date: 09/24/2020 CLINICAL DATA:  sepsis, fever, right foot pain in blistering, no reported injury EXAM: RIGHT FOOT - 2 VIEW COMPARISON:  None FINDINGS: Vascular calcifications throughout the soft tissues. No fracture or dislocation. No focal osseous lesions. No cortical erosions or periosteal reaction. No radiopaque foreign bodies. No significant arthropathy. IMPRESSION: No acute osseous abnormality. Vascular calcifications throughout the soft tissues, suggesting diabetes mellitus. Electronically Signed   By: Ilona Sorrel M.D.   On: 09/24/2020 07:27   VAS [place] ABI WITH/WO TBI  Result Date: 09/21/2020  LOWER EXTREMITY DOPPLER STUDY Patient Name:  Allen Yu   Date of Exam:   09/21/2020 Medical Rec #: [id number]       Accession #:    [id number] Date of Birth: 03/30/1945      Patient Gender: M Patient Age:   83 years Exam Location:  [facility] Procedure:      VAS [place] ABI WITH/WO TBI Referring Phys: Aldona Bar RHYNE --------------------------------------------------------------------------------  Indications: Rest pain, and peripheral artery disease. High Risk         Hypertension, Diabetes, current smoker, coronary artery Factors:          disease.  Vascular Interventions: 06/28/2020: Left above-knee amputation. Limitations: Today's exam was limited due to patient in wheelchair. Comparison Study: 05/18/2020: Rt ABI 0.85; Lt ABI [name] Performing Technologist: Ivan Croft  Examination Guidelines: A complete evaluation includes at minimum, Doppler waveform signals and systolic blood pressure reading at the level of bilateral brachial, anterior tibial, and posterior tibial arteries, when vessel segments are accessible. Bilateral testing is considered an integral part of a complete examination. Photoelectric Plethysmograph (PPG) waveforms and toe systolic pressure readings are included as required and additional duplex testing as needed. Limited examinations for reoccurring indications may be performed as noted.  ABI Findings: +---------+------------------+-----+----------+--------+ Right    Rt Pressure (mmHg)IndexWaveform  Comment  +---------+------------------+-----+----------+--------+ Brachial 154                                       +---------+------------------+-----+----------+--------+ PTA      184               1.17 biphasic           +---------+------------------+-----+----------+--------+ DP       173               1.10 monophasic         +---------+------------------+-----+----------+--------+ Simone Curia  0.33                    +---------+------------------+-----+----------+--------+  +--------+------------------+-----+--------+-------+ Left    Lt Pressure (mmHg)IndexWaveformComment +--------+------------------+-----+--------+-------+ [id number]                                    +--------+------------------+-----+--------+-------+ +-------+-----------------+-----------+------------+------------+ ABI/TBIToday's ABI      Today's TBIPrevious ABIPrevious TBI +-------+-----------------+-----------+------------+------------+ Right  1.17 (unreliable)0.33       0.85        0.10         +-------+-----------------+-----------+------------+------------+ Left   AKA              AKA        [place]          0            +-------+-----------------+-----------+------------+------------+  Arterial wall calcification precludes accurate ankle pressures and ABIs.  Summary: Right: The right toe-brachial index is abnormal. Although ankle brachial indices are within normal limits (0.95-1.29), arterial Doppler waveforms at the ankle suggest some component of arterial occlusive disease.  *See table(s) above for measurements and observations.  Electronically signed by Servando Snare MD on 09/21/2020 at 1:53:09 PM.    Final    ECHOCARDIOGRAM COMPLETE  Result Date: 09/27/2020    ECHOCARDIOGRAM REPORT   Patient Name:   Allen Yu Date of Exam: 09/27/2020 Medical Rec #:  [id number]      Height:       73.0 in Accession #:    [id number]     Weight:       127.2 lb Date of Birth:  15-Nov-1945     BSA:          1.775 m Patient Age:    45 years       BP:           188/97 mmHg Patient Gender: M              HR:           96 bpm. Exam Location:  [facility] Procedure: 2D Echo, Cardiac Doppler and Color Doppler Indications:    CHF  History:        Patient has prior history of Echocardiogram examinations, most                 recent 06/29/2020. CHF, PVD and COPD, Arrythmias:Atrial                 Fibrillation, Signs/Symptoms:Sepsis; Risk Factors:Hypertension                 and Diabetes. PTSD, dementia,  schizophrenia.  Sonographer:    Dustin Flock RDCS Referring Phys: [id number] [place]  1. Irregular heart rhythm makes accurate assessment of LVEF difficult LV systolic function is depressed with infeiror hypokinesis. COmpared to echo from June 2022 the inferior changes appear new. Left ventricular ejection fraction, by estimation, is 45%%. There is mild left ventricular hypertrophy. Left ventricular diastolic parameters are indeterminate.  2. Right ventricular systolic function is low normal. The right ventricular size is normal. There is moderately elevated pulmonary artery systolic pressure.  3. Right atrial size was mildly dilated.  4. The mitral valve is normal in structure. Mild mitral valve regurgitation.  5. The aortic valve is tricuspid. Aortic valve regurgitation is not visualized. Mild to moderate aortic valve sclerosis/calcification is present, without any evidence of aortic stenosis.  6. The inferior vena cava is dilated in size with <50% respiratory variability, suggesting right atrial pressure of 15 mmHg. FINDINGS  Left Ventricle: Irregular heart rhythm makes accurate assessment of LVEF difficult LV systolic function is depressed with infeiror hypokinesis. COmpared to echo from June 2022 the inferior changes appear new. Left ventricular ejection fraction, by estimation, is 45%%. The left ventricular internal cavity size was normal in size. There is mild left ventricular hypertrophy. Left ventricular diastolic parameters are indeterminate. Right Ventricle: The right ventricular size is normal. Right vetricular wall thickness was not assessed. Right ventricular systolic function is low normal. There is moderately elevated pulmonary artery systolic pressure. The tricuspid regurgitant velocity is 3.48 m/s, and with an assumed right atrial pressure of 3 mmHg, the estimated right ventricular systolic pressure is [id number] mmHg. Left Atrium: Left atrial size was normal in size. Right Atrium:  Right atrial size was mildly dilated. Pericardium: Trivial pericardial effusion is present. Mitral Valve: The mitral valve is normal in structure. There is mild thickening of the mitral valve leaflet(s). Mild mitral valve regurgitation. Tricuspid Valve: The tricuspid valve is normal in structure. Tricuspid valve regurgitation is mild. Aortic Valve: The aortic valve is tricuspid. Aortic valve regurgitation is not visualized. Mild to moderate aortic valve sclerosis/calcification is present, without any evidence of aortic stenosis. Pulmonic Valve: The pulmonic valve was not well visualized. Pulmonic valve regurgitation is not visualized. No evidence of pulmonic stenosis. Aorta: The aortic root and ascending aorta are structurally normal, with no evidence of dilitation. Venous: The inferior vena cava is dilated in size with less than 50% respiratory variability, suggesting right atrial pressure of 15 mmHg. IAS/Shunts: No atrial level shunt detected by color flow Doppler.  LEFT VENTRICLE PLAX 2D LVIDd:         4.27 cm  Diastology LVIDs:         3.22 cm  LV e' medial:    7.42 cm/s LV PW:         1.38 cm  LV E/e' medial:  11.7 LV IVS:        1.35 cm  LV e' lateral:   10.10 cm/s LVOT diam:     2.20 cm  LV E/e' lateral: 8.6 LV SV:         52 LV SV Index:   29 LVOT Area:     3.80 cm  RIGHT VENTRICLE RV Basal diam:  2.87 cm RV S prime:     14.10 cm/s TAPSE (M-mode): 2.1 cm LEFT ATRIUM             Index       RIGHT ATRIUM           Index LA diam:        3.20 cm 1.80 cm/m  RA Area:     17.50 cm LA Vol (A2C):   65.1 ml 36.67 ml/m RA Volume:   54.20 ml  30.53 ml/m LA Vol (A4C):   45.0 ml 25.35 ml/m LA Biplane Vol: 54.4 ml 30.64 ml/m  AORTIC VALVE LVOT Vmax:   83.50 cm/s LVOT Vmean:  50.800 cm/s LVOT VTI:    0.137 m  AORTA Ao Root diam: 3.40 cm MITRAL VALVE               TRICUSPID VALVE MV Area (PHT): 10.99 cm   TR Peak grad:   48.4 mmHg MV Decel Time: 69 msec     TR Vmax:        348.00 cm/s MV E velocity:  86.70 cm/s MV A  velocity: 80.10 cm/s  SHUNTS MV E/A ratio:  1.08        Systemic VTI:  0.14 m                            Systemic Diam: 2.20 cm Dorris Carnes MD Electronically signed by Dorris Carnes MD Signature Date/Time: 09/27/2020/10:12:23 PM    Final    US SCROTUM W/DOPPLER  Result Date: 09/24/2020 CLINICAL DATA:  Mass of testicle. EXAM: SCROTAL ULTRASOUND DOPPLER ULTRASOUND OF THE TESTICLES TECHNIQUE: Complete ultrasound examination of the testicles, epididymis, and other scrotal structures was performed. Color and spectral Doppler ultrasound were also utilized to evaluate blood flow to the testicles. COMPARISON:  Abdominopelvic CT 07/21/2020 FINDINGS: Right testicle Measurements: 4.3 x 2.8 x 3.2 cm. Homogeneous echogenicity. Normal blood flow. No testicular mass or microlithiasis visualized. Left testicle Measurements: 3.3 x 2.2 x 3.3 cm. Homogeneous echogenicity. Normal blood flow. No mass or microlithiasis visualized. Right epididymis: Heterogeneous and hyperemic. No focal lesion or fluid collection. Left epididymis:  Normal in size and appearance. Hydrocele:  Small to moderate on the right. Varicocele:  None visualized. Pulsed Doppler interrogation of both testes demonstrates normal low resistance arterial and venous waveforms bilaterally. Other: Heterogeneity of the intrascrotal fat on the right. There is a possible fat containing right inguinal hernia, not well-defined by ultrasound. IMPRESSION: 1. Heterogeneous right epididymis with increased vascularity suspicious for epididymitis. 2. Heterogeneous appearance of the intrascrotal fat in the right with possible fat containing inguinal hernia. This is not definite, and not well assessed by ultrasound. 3. Small to moderate right hydrocele. 4. Normal sonographic appearance of both testicles without intratesticular mass. Electronically Signed   By: Keith Rake M.D.   On: 09/24/2020 17:18    Microbiology: Recent Results (from the past 240 hour(s))  Urine Culture      Status: Abnormal   Collection Time: 09/24/20  6:32 AM   Specimen: Urine, Clean Catch  Result Value Ref Range Status   Specimen Description   Final    URINE, CLEAN CATCH Performed at [facility], [address], [place], [place] [address]    Special Requests   Final    NONE Performed at [facility], [address], [place], [place] [address]    Culture (A)  Final    >=100,000 COLONIES/mL PSEUDOMONAS AERUGINOSA >=100,000 COLONIES/mL KLEBSIELLA PNEUMONIAE    Report Status 09/26/2020 FINAL  Final   Organism ID, Bacteria PSEUDOMONAS AERUGINOSA (A)  Final   Organism ID, Bacteria KLEBSIELLA PNEUMONIAE (A)  Final      Susceptibility   Klebsiella pneumoniae - MIC*    AMPICILLIN >=32 RESISTANT Resistant     CEFAZOLIN 16 SENSITIVE Sensitive     CEFEPIME <=0.12 SENSITIVE Sensitive     CEFTRIAXONE <=0.25 SENSITIVE Sensitive     CIPROFLOXACIN <=0.25 SENSITIVE Sensitive     GENTAMICIN <=1 SENSITIVE Sensitive     IMIPENEM <=0.25 SENSITIVE Sensitive     NITROFURANTOIN 128 RESISTANT Resistant     TRIMETH/SULFA <=20 SENSITIVE Sensitive     AMPICILLIN/SULBACTAM >=32 RESISTANT Resistant     PIP/TAZO >=128 RESISTANT Resistant     * >=100,000 COLONIES/mL KLEBSIELLA PNEUMONIAE   Pseudomonas aeruginosa - MIC*    CEFTAZIDIME 4 SENSITIVE Sensitive     CIPROFLOXACIN 0.5 SENSITIVE Sensitive     GENTAMICIN <=1 SENSITIVE Sensitive     IMIPENEM 1 SENSITIVE Sensitive     PIP/TAZO <=4 SENSITIVE Sensitive  CEFEPIME 2 SENSITIVE Sensitive     * >=100,000 COLONIES/mL PSEUDOMONAS AERUGINOSA  Resp Panel by RT-PCR (Flu A&B, Covid) Nasopharyngeal Swab     Status: None   Collection Time: 09/24/20  6:41 AM   Specimen: Nasopharyngeal Swab; Nasopharyngeal(NP) swabs in vial transport medium  Result Value Ref Range Status   SARS Coronavirus 2 by RT PCR NEGATIVE NEGATIVE Final    Comment: (NOTE) SARS-CoV-2 target nucleic acids are NOT DETECTED.  The SARS-CoV-2 RNA is generally detectable in upper  respiratory specimens during the acute phase of infection. The lowest concentration of SARS-CoV-2 viral copies this assay can detect is 138 copies/mL. A negative result does not preclude SARS-Cov-2 infection and should not be used as the sole basis for treatment or other patient management decisions. A negative result may occur with  improper specimen collection/handling, submission of specimen other than nasopharyngeal swab, presence of viral mutation(s) within the areas targeted by this assay, and inadequate number of viral copies(<138 copies/mL). A negative result must be combined with clinical observations, patient history, and epidemiological information. The expected result is Negative.  Fact Sheet for Patients:  EntrepreneurPulse.com.au  Fact Sheet for Healthcare Providers:  IncredibleEmployment.be  This test is no t yet approved or cleared by the [place] FDA and  has been authorized for detection and/or diagnosis of SARS-CoV-2 by FDA under an Emergency Use Authorization (EUA). This EUA will remain  in effect (meaning this test can be used) for the duration of the COVID-19 declaration under Section 564(b)(1) of the Act, 21 U.S.C.section 360bbb-3(b)(1), unless the authorization is terminated  or revoked sooner.       Influenza A by PCR NEGATIVE NEGATIVE Final   Influenza B by PCR NEGATIVE NEGATIVE Final    Comment: (NOTE) The Xpert Xpress SARS-CoV-2/FLU/RSV plus assay is intended as an aid in the diagnosis of influenza from Nasopharyngeal swab specimens and should not be used as a sole basis for treatment. Nasal washings and aspirates are unacceptable for Xpert Xpress SARS-CoV-2/FLU/RSV testing.  Fact Sheet for Patients: EntrepreneurPulse.com.au  Fact Sheet for Healthcare Providers: IncredibleEmployment.be  This test is not yet approved or cleared by the [place] FDA and has been  authorized for detection and/or diagnosis of SARS-CoV-2 by FDA under an Emergency Use Authorization (EUA). This EUA will remain in effect (meaning this test can be used) for the duration of the COVID-19 declaration under Section 564(b)(1) of the Act, 21 U.S.C. section 360bbb-3(b)(1), unless the authorization is terminated or revoked.  Performed at [facility], [address] [place], [place] [address]   Blood Culture (routine x 2)     Status: None   Collection Time: 09/24/20  6:41 AM   Specimen: BLOOD  Result Value Ref Range Status   Specimen Description BLOOD  Final   Special Requests NONE  Final   Culture   Final    NO GROWTH 5 DAYS Performed at [facility], [address], [place], [place] [address]    Report Status 09/29/2020 FINAL  Final  Blood Culture (routine x 2)     Status: None   Collection Time: 09/24/20  6:42 AM   Specimen: BLOOD  Result Value Ref Range Status   Specimen Description BLOOD  Final   Special Requests NONE  Final   Culture   Final    NO GROWTH 5 DAYS Performed at [facility], [address] [place],  [address]    Report Status 09/29/2020 FINAL  Final  MRSA Next Gen by PCR, Nasal  Status: None   Collection Time: 09/24/20 12:45 PM   Specimen: Nasal Mucosa; Nasal Swab  Result Value Ref Range Status   MRSA by PCR Next Gen NOT DETECTED NOT DETECTED Final    Comment: (NOTE) The GeneXpert MRSA Assay (FDA approved for NASAL specimens only), is one component of a comprehensive MRSA colonization surveillance program. It is not intended to diagnose MRSA infection nor to guide or monitor treatment for MRSA infections. Test performance is not FDA approved in patients less than 61 years old. Performed at [facility], [address] [place], [place] [address]      Labs: Basic Metabolic Panel: Recent Labs  Lab 09/25/20 1341 09/26/20 0408 09/27/20 0428 09/28/20 0451 09/29/20 0536  NA 134* 137 139 139 136  K 3.2* 3.2* 3.5 3.2* 4.1  CL 103  107 107 106 102  CO2 '23 25 23 25 26  '$ GLUCOSE 105* 104* 222* 58* 315*  BUN '17 17 18 17 '$ 24*  CREATININE 0.90 0.93 1.07 1.15 1.22  CALCIUM 7.8* 8.2* 8.6* 8.2* 8.3*  MG 1.8 1.9 2.0 1.7 1.8  PHOS 3.2 2.7 2.6 3.0 3.1   Liver Function Tests: Recent Labs  Lab 09/25/20 1341 09/26/20 0408 09/27/20 0428 09/28/20 0451 09/29/20 0536  AST '25 20 18 '$ 13* 27  ALT '16 15 14 13 18  '$ ALKPHOS 112 128* 143* 107 138*  BILITOT 1.5* 1.1 1.0 0.6 0.3  PROT 6.0* 5.9* 6.0* 5.9* 5.4*  ALBUMIN 2.5* 2.3* 2.3* 2.3* 2.3*   No results for input(s): LIPASE, AMYLASE in the last 168 hours. No results for input(s): AMMONIA in the last 168 hours. CBC: Recent Labs  Lab 09/25/20 1341 09/26/20 0408 09/27/20 0428 09/28/20 0451 09/29/20 0536  WBC 32.0* 32.7* 22.3* 11.2* 8.6  NEUTROABS 30.1* 29.5* 20.4* 9.1* 6.3  HGB 8.1* 8.1* 7.8* 7.7* 7.4*  HCT 24.6* 24.6* 24.0* 23.9* 22.5*  MCV 92.8 91.8 92.7 91.2 91.1  PLT 223 266 291 308 323   Cardiac Enzymes: No results for input(s): CKTOTAL, CKMB, CKMBINDEX, TROPONINI in the last 168 hours. BNP: Invalid input(s): POCBNP CBG: Recent Labs  Lab 09/28/20 1122 09/28/20 1651 09/28/20 2015 09/29/20 0017 09/29/20 0259  GLUCAP 144* 227* 177* 285* 316*    Time coordinating discharge:  36 minutes  Signed:  Orson Eva, DO [facility] Pager: [phone] 09/29/2020, 10:55 AM

## 2020-09-29 NOTE — Progress Notes (Signed)
SATURATION QUALIFICATIONS: (This note is used to comply with regulatory documentation for home oxygen)   Patient Saturations on Room Air at Rest = 86   Patient Saturations on Room Air while Ambulating = N/A   Patient Saturations on 2 Liters of oxygen while Ambulating = 95%   Please briefly explain why patient needs home oxygen: To maintain 02 sat at 90% or above during ambulation.   Orson Eva, DO

## 2020-09-29 NOTE — Progress Notes (Signed)
SATURATION QUALIFICATIONS: (This note is used to comply with regulatory documentation for home oxygen)  Patient Saturations on Room Air at Rest = 91%  Patient Saturations on Room Air while Ambulating = 89% -90% With movement in bed. Unable to ambulate d/t AKA   Patient Saturations on 2 Liters of oxygen while Ambulating = 95 %  Please briefly explain why patient needs home oxygen:  To maintain o2 sat above 90%  or above during movement

## 2020-09-29 NOTE — Care Management Important Message (Signed)
Important Message  Patient Details  Name: Allen Yu MRN: [id number] Date of Birth: 1945/03/18   Medicare Important Message Given:  Yes     [name] 09/29/2020, 12:18 PM

## 2020-09-29 NOTE — Plan of Care (Signed)
  Problem: Education: Goal: Knowledge of General Education information will improve Description Including pain rating scale, medication(s)/side effects and non-pharmacologic comfort measures Outcome: Progressing   Problem: Health Behavior/Discharge Planning: Goal: Ability to manage health-related needs will improve Outcome: Progressing   

## 2020-09-29 NOTE — Progress Notes (Signed)
Palliative:  HPI: 75 y.o. male  with past medical history of schizophrenia, PTSD, dementia, PVD, s/p L AKA, atrial fibrillation, HTN, lung nodule, nepholithiasis, renal retention with indwelling foley, diabetes, diabetes neuropathy, chronic back pain admitted on 09/24/2020 with severe sepsis with right testicular epididymitis and likely pneumonia.  I met today at Allen Yu's bedside as he is just finishing work with therapy. No family at bedside. He is in good spirits and smiling and thanking everyone for all their help with him. I spoke with therapist who reports that he sat on edge of bed but very resistant. I discussed with her that this is not surprising from what wife told me he is very scared about falling and he relies and trusts her heavily at home. She did not that he can feed self and go get a glass of water and washes his top half. Otherwise wife provides much assistance to him but he is able to help her. He appears to be likely at his baseline at this time from what wife previously described. Wife was also clear on her desire for him to return home and said she will notify his providers/[facility] when she can no longer care for him at home. Wife has been arranging for maximum care giving support being arranged via [place]. DNR was established on my previous visit.   Exam: Smiling. Good spirits. No distress. Breathing regular, unlabored. Abd soft, flat.   Plan: - DNR established previously.  - Could benefit from outpatient palliative to follow with ongoing vascular concerns and need for future decision making.   15 min  Vinie Sill, NP Palliative Medicine Team Pager [phone] (Please see [email or web address] for schedule) Team Phone [phone]    Greater than 50%  of this time was spent counseling and coordinating care related to the above assessment and plan

## 2020-09-30 ENCOUNTER — Other Ambulatory Visit: Payer: Self-pay | Admitting: *Deleted

## 2020-09-30 LAB — CULTURE, BLOOD (ROUTINE X 2)
Culture: NO GROWTH
Special Requests: ADEQUATE

## 2020-09-30 NOTE — Patient Outreach (Signed)
[facility]) Care Management  09/30/2020  Allen Yu 20-Dec-1945 [id number]   [facility] Unsuccessful outreach  Allen Yu was referred to [facility] on 07/01/20 by [facility] hospital liaison for post hospital/complex care services after his left AKA (above the knee amputation) and for disease management of diabetes   Insurance: tricare for life, Medicare   Outreach attempt to [phone]  No answer. [facility] RN CM left HIPAA [facility] Portability and Accountability Act) compliant voicemail message along with CM's contact info.  Also sent an e-mail  Plan: [place] CM scheduled this patient for another call attempt within 7 business days Unsuccessful outreach on 09/30/20    [name], RN, BSN, [place] Coordinator Office number [phone] Mobile number [phone]  Main [facility] number [phone] Fax number [phone]

## 2020-10-06 ENCOUNTER — Other Ambulatory Visit: Payer: Self-pay | Admitting: *Deleted

## 2020-10-06 DIAGNOSIS — N401 Enlarged prostate with lower urinary tract symptoms: Secondary | ICD-10-CM | POA: Insufficient documentation

## 2020-10-06 DIAGNOSIS — Z7189 Other specified counseling: Secondary | ICD-10-CM | POA: Insufficient documentation

## 2020-10-06 DIAGNOSIS — N138 Other obstructive and reflux uropathy: Secondary | ICD-10-CM | POA: Insufficient documentation

## 2020-10-06 DIAGNOSIS — F17200 Nicotine dependence, unspecified, uncomplicated: Secondary | ICD-10-CM | POA: Insufficient documentation

## 2020-10-06 DIAGNOSIS — S78112A Complete traumatic amputation at level between left hip and knee, initial encounter: Secondary | ICD-10-CM | POA: Insufficient documentation

## 2020-10-06 NOTE — Patient Outreach (Addendum)
[facility]) Care Management  10/06/2020  Allen Yu 14-Jun-1945 [id number]  [facility] outreach to complex care patient   Allen Allen Yu was referred to [facility] on 07/01/20 by [facility] hospital liaison for post hospital/complex care services after his left AKA (above the knee amputation) and for disease management of diabetes   Insurance: tricare for life (fully [place] service connected), Medicare     On 09/29/20 RN CM received e-mail from [place] Tour manager) and [place] related to high utilizer referral (admissions x 5 with ED visits , 1 rehab stay in the past 6 months), DNR, Palliative care goals of care follow up/plan, smoke cessation, nutrition, pain, home care needs/services Pt/wife has been engaged with [facility] since 07/01/20   Outreach attempt to [phone] for the agreed upon outreach and after no response from outreach attempt to wife on 09/30/20 via phone and e-mail [name] answered and verified HIPAA identifiers   Assessment  Allen Yu reports Allen Yu did have a return ED visit for infection but is now "doing very well" She reports she feels like she is "getting my husband back"  She confirms Veteran's administration (VA) services, home health services and personal care services have all been started. She also updated RN CM that Allen Yu is more open, coping and appreciative of all services. He initially did not want any services in the home. Today was the initial VA personal care aide visit [facility]) who is present in the home during this outreach He has been seen by VA PT, OT and Advance home care RN  They are pending home modification services assistance from the [place]    Allen Yu is now a DNR (established during 09/27/20 inpatient palliative care visit by Delora Fuel, NP Palliative care is not present as he continues to receive active care   Advance directive ? Initiated at [place] on 09/07/20  Allen Yu reports Allen Yu [place] home needs are adequately being  met at this time Allen Yu confirms she assists with the transportation to all the medical appointments She confirms Allen Yu is scheduled for an angiogram on 10/07/20 His brother outreach, was updated on pt care needs, offered assistance but Allen Yu now has coordinated all care needs. Allen Yu reports a noted decrease in pt's family and friend's support but she has continue to manage Allen Yu reviews Allen Yu [facility] daily appointments with him Today the had six tasks and all are completed Call from [place], [facility], MD appointment, Visit from aide, prep for 10/06/20 procedure He has necessary DME at home to include a bedside commode, foldable entrance ramp, 2 wheel walker, single point cane, manual wheelchair that is being updated by [place]   She denies any care coordination of needs today but for [facility] progression prefers further outreach for support and assessment   His left aka (06/28/20) is reported to have healed well  The concern now are the right 4th an 5th toes  Allen Yu was reported by Allen Yu to be "down about it" and mentioned to a home health staff the he anticipated this toe to be amputated. Positive talk was encouraged by Allen Yu and the staff PMH schizophrenia/dementia/PTSD on Risperdal, Remeron and donepezil His 10/07/20 scheduled abdominal aortogram with lower extremity is to be attended   Weight 120 lbs on 09/29/20 at [facility] Mirtazapine was recently added  Being seen by [facility] dietician  Past Medical History:  Diagnosis Date   Anemia    Arthritis  Carotid stenosis    Chronic back pain    Chronic kidney disease    Constipation    Dementia (HCC)    Diabetes mellitus    GERD (gastroesophageal reflux disease)    History of kidney stones    Hypertension    Lung nodule    PAF (paroxysmal atrial fibrillation) (HCC)    Peripheral neuropathy    Peripheral vascular disease (HCC)    PTSD (post-traumatic stress disorder)    Schizophrenia ([place])    Tuberculosis    Treated    Patient Active Problem List   Diagnosis Date Noted   Severe sepsis ([place]) 09/24/2020   Schizophrenia ([place]) 09/24/2020   Dementia without behavioral disturbance ([place]) 09/24/2020   Lung nodule 09/24/2020   Indwelling Foley catheter present 09/24/2020   PVD (peripheral vascular disease) ([place]) 09/24/2020   Mass of testicle 09/24/2020   Problem related to unspecified psychosocial circumstances 08/01/2020   Hypermetropia 07/22/2020   AF (paroxysmal atrial fibrillation) ([place]) 07/22/2020   Klebsiella sepsis ([place]) 07/22/2020   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   SIRS (systemic inflammatory response syndrome) ([place]) 27/51/7001   Acute metabolic encephalopathy 74/94/4967   Hyponatremia    S/P AKA (above knee amputation) unilateral, left ([place]) 06/28/2020   Gangrene of left foot ([place]) 06/28/2020   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 ([place]) 05/27/2020   Critical lower limb ischemia ([place]) 05/21/2020   Protein-calorie malnutrition, severe 05/19/2020   Plantar callus 12/24/2019   Cavitary pneumonia 09/14/2019   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   Unintentional weight loss 09/14/2019   Coronary artery calcification seen on CT scan 10/28/2018   Hilar adenopathy 04/10/2016   Lung nodule seen on imaging study 07/09/2015   Acute encephalopathy    Hypoglycemia due to insulin    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[place]) 07/08/2015   Acute kidney injury superimposed on chronic kidney disease ([place]) 07/08/2015   Tobacco use disorder 07/08/2015   Diverticulosis of colon without hemorrhage    Left inguinal hernia 09/16/2013   Altered mental status 11/17/2012   Hypothermia 11/17/2012   Leukocytosis 11/17/2012   CKD (chronic kidney disease), stage II    PTSD (post-traumatic stress disorder)    GERD (gastroesophageal reflux disease)    Essential (primary) hypertension 09/10/2011   Anemia in chronic illness 09/10/2011   Diabetic neuropathy ([place]) 09/10/2011   DDD  (degenerative disc disease), lumbar 09/10/2011   Psychosis ([place]) 09/10/2011   Diabetes mellitus type 2, uncontrolled ([place]) 09/10/2011   HLD (hyperlipidemia) 09/10/2011   Microalbuminuria 09/10/2011   Current Outpatient Medications on File Prior to Visit  Medication Sig Dispense Refill   acetaminophen (TYLENOL) 500 MG tablet Take 1,000 mg by mouth every 6 (six) hours as needed for moderate pain.     atorvastatin (LIPITOR) 40 MG tablet Take 1 tablet (40 mg total) by mouth at bedtime. 90 tablet 0   B-D UF III MINI PEN NEEDLES 31G X 5 MM MISC SMARTSIG:1 Each SUB-Q Daily     Bacitracin-Polymyxin B (NEOSPORIN EX) Apply 1 application topically daily as needed (wound care).     donepezil (ARICEPT) 10 MG tablet Take 10 mg by mouth at bedtime.      ferrous sulfate 325 (65 FE) MG tablet Take 325 mg by mouth daily.     finasteride (PROSCAR) 5 MG tablet Take 5 mg by mouth daily.     gabapentin (NEURONTIN) 100 MG capsule Take 100 mg by mouth at bedtime.     LANTUS 100 UNIT/ML injection Inject  0.1 mLs (10 Units total) into the skin at bedtime as needed (High blood glucose). (Patient taking differently: Inject 25 Units into the skin at bedtime.) 10 mL 11   levofloxacin (LEVAQUIN) 500 MG tablet Take 1 tablet (500 mg total) by mouth daily. 8 tablet 0   metoprolol tartrate (LOPRESSOR) 25 MG tablet Take 1 tablet (25 mg total) by mouth 2 (two) times daily. 180 tablet 3   mirtazapine (REMERON) 7.5 MG tablet Take 7.5 mg by mouth at bedtime.     nicotine (NICODERM CQ - DOSED IN MG/24 HOURS) 21 mg/24hr patch Place 1 patch (21 mg total) onto the skin daily as needed (Cessation of smoking). (Patient not taking: No sig reported) 28 patch 0   risperiDONE (RISPERDAL) 0.5 MG tablet Take 1 tablet (0.5 mg total) by mouth 2 (two) times daily. 60 tablet 0   tamsulosin (FLOMAX) 0.4 MG CAPS capsule Take 1 capsule (0.4 mg total) by mouth daily. 30 capsule 1   traMADol (ULTRAM) 50 MG tablet Take 50 mg by mouth every 6 (six) hours  as needed for moderate pain.     No current facility-administered medications on file prior to visit.     Plan  Patient/wife agrees to care plan and follow up within the next 30 business days Encourged to outreach prn    Goals Addressed               This Visit's Progress     Patient Stated     [facility]) Eat Healthy (pt-stated)   On track     Timeframe:  Long-Range Goal Priority:  High Start Date:                     07/05/20        Expected End Date:           10/21/20            Follow Up Date 10/21/20  Barriers: Knowledge    - set a realistic goal - take in non milk based supplement -supplied by [place]    Notes:  10/06/20 supplements provided by [facility]  09/15/20 outreached was rescheduled  08/15/20 Ensure supplements to be provided by [facility] 08/01/20 pt eating poorly continues with episodes of diarrhea, social concerns, palliative care visit 08/11/20 07/06/20 diarrhea from milk base boost to try non milk base product      [facility]) Manage My Emotions (pt-stated)   On track     Timeframe:  Short-Term Goal Priority:  High Start Date:                        08/01/20     Expected End Date:       10/21/20                Follow Up Date 10/21/20   Barriers: Knowledge Psychosocial  - call and visit an old friend - join a support group - talk about feelings with a friend, family or spiritual advisor - practice positive thinking and self-talk   Notes:  10/06/20 seeing psychiatry provider at [facility] next visit 10/25/20 09/15/20 outreach rescheduled 08/15/20 follow up with Allen Yu, Pt in [place] ED after pcp visit low hemoglobin (6.7 in ED), foley catheter pain, critical low K+(2.7 in ED) discharged home per pt request after blood transfusion- advance dementia noted 08/04/20 seen at [facility] on 08/03/20 08/01/20 money reported missing from account, returning home with friends soiled, informing wife  wanting to spend all money before pass, starting to do less of ADLs Wife interested in  possible VA or local mentor for coping      COMPLETED: [facility]) Manage My Medicine (pt-stated)   On track     Timeframe:  Short-Term Goal Priority:  High Start Date:                         07/05/20    Expected End Date:              10/21/20         Completed goal 10/06/20   Barriers: Knowledge  - call for medicine refill 2 or 3 days before it runs out - keep a list of all the medicines I take; vitamins and herbals too -pick up insulin when able from local pharmacy and use mail order prn   Notes: 10/06/20 goal completed Now with VA medication assistance  09/15/20 outreached was rescheduled  08/15/20 VA to assist with medications issued by [place], pcp to assist with medications offered by pcp.  08/01/20 medication received but patient reported to not be taking medications as ordered/social concerns. palliative care visit 08/11/20 07/06/20 wife will go pick up insulin from pharmacy  07/05/20 pending refill of insulin        Monitor and Manage My Blood Sugar-Diabetes Type 2 ([facility]) (pt-stated)   Not on track     Timeframe:  Long-Range Goal Priority:  High Start Date:               10/06/20              Expected End Date:      01/20/21                 Follow Up Date 10/21/20    - check blood sugar at prescribed times      Notes:  10/06/20 had an increase in A1c after various admission for infection 09/14/20 A1c 10.5 was 9.2      Track Symptoms-Urinary Incontinence [facility]) (pt-stated)   Not on track     Timeframe:  Long-Range Goal Priority:  High Start Date:             10/06/20                Expected End Date:      01/20/21                 Follow Up Date 10/21/20    - track when, how much I go -continue use of foley for obstruction management     Notes:  10/06/20 foley intact for obstruction/various failed void trials- followed by urologist         [name], RN, BSN, [place] Coordinator Office number [phone] Main [facility] number  [phone] Fax number [phone]

## 2020-10-07 ENCOUNTER — Other Ambulatory Visit: Payer: Self-pay

## 2020-10-07 ENCOUNTER — Ambulatory Visit (HOSPITAL_COMMUNITY)
Admission: RE | Admit: 2020-10-07 | Discharge: 2020-10-07 | Disposition: A | Discharge status: Home / Self Care | Payer: Medicare Other | Attending: Vascular Surgery | Admitting: Vascular Surgery

## 2020-10-07 ENCOUNTER — Encounter (HOSPITAL_COMMUNITY)
Admission: RE | Disposition: A | Discharge status: Home / Self Care | Payer: Self-pay | Source: Home / Self Care | Attending: Vascular Surgery

## 2020-10-07 DIAGNOSIS — E119 Type 2 diabetes mellitus without complications: Secondary | ICD-10-CM | POA: Diagnosis not present

## 2020-10-07 DIAGNOSIS — Z539 Procedure and treatment not carried out, unspecified reason: Secondary | ICD-10-CM | POA: Insufficient documentation

## 2020-10-07 LAB — GLUCOSE, CAPILLARY
Glucose-Capillary: 404 mg/dL — ABNORMAL HIGH (ref 70–99)
Glucose-Capillary: 359 mg/dL — ABNORMAL HIGH (ref 70–99)

## 2020-10-07 LAB — POCT I-STAT, CHEM 8
BUN: 27 mg/dL — ABNORMAL HIGH (ref 8–23)
Calcium, Ion: 1.11 mmol/L — ABNORMAL LOW (ref 1.15–1.40)
Chloride: 95 mmol/L — ABNORMAL LOW (ref 98–111)
Creatinine, Ser: 1 mg/dL (ref 0.61–1.24)
Glucose, Bld: 429 mg/dL — ABNORMAL HIGH (ref 70–99)
HCT: 21 % — ABNORMAL LOW (ref 39.0–52.0)
Hemoglobin: 7.1 g/dL — ABNORMAL LOW (ref 13.0–17.0)
Potassium: 4.3 mmol/L (ref 3.5–5.1)
Sodium: 136 mmol/L (ref 135–145)
TCO2: 33 mmol/L — ABNORMAL HIGH (ref 22–32)

## 2020-10-07 SURGERY — ABDOMINAL AORTOGRAM W/LOWER EXTREMITY
Anesthesia: LOCAL

## 2020-10-07 MED ORDER — INSULIN ASPART 100 UNIT/ML IJ SOLN
INTRAMUSCULAR | Status: AC
  Filled 2020-10-07: qty 1

## 2020-10-07 MED ORDER — INSULIN ASPART 100 UNIT/ML IJ SOLN
10.0000 [IU] | Freq: Once | INTRAMUSCULAR | Status: AC
  Administered 2020-10-07: 10 [IU] via SUBCUTANEOUS
  Filled 2020-10-07: qty 0.1

## 2020-10-07 MED ORDER — SODIUM CHLORIDE 0.9 % IV SOLN: INTRAVENOUS | Status: DC

## 2020-10-07 NOTE — Progress Notes (Signed)
Dr Standley Dakins notified of CBG and ok to d/c home

## 2020-10-10 DIAGNOSIS — A419 Sepsis, unspecified organism: Secondary | ICD-10-CM | POA: Diagnosis not present

## 2020-10-10 DIAGNOSIS — J9600 Acute respiratory failure, unspecified whether with hypoxia or hypercapnia: Secondary | ICD-10-CM | POA: Diagnosis not present

## 2020-10-10 DIAGNOSIS — L570 Actinic keratosis: Secondary | ICD-10-CM | POA: Diagnosis not present

## 2020-10-12 ENCOUNTER — Other Ambulatory Visit: Payer: Self-pay

## 2020-10-12 NOTE — H&P (Signed)
Patient presents to [facility] short stay for angiogram. Recent discharge from hospital for sepsis. He is non ambulatory and confused. I do not think it safe to proceed with aniogram at this time. Discharge home. Follow up with [place] in office in 2-4 weeks.  [name]. Stanford Breed, MD [facility] Phone Number: [phone] 10/12/2020 10:05 AM

## 2020-10-17 ENCOUNTER — Other Ambulatory Visit (HOSPITAL_COMMUNITY)
Admission: RE | Admit: 2020-10-17 | Discharge: 2020-10-17 | Disposition: A | Discharge status: Home / Self Care | Payer: Medicare Other | Source: Other Acute Inpatient Hospital | Attending: Urology | Admitting: Urology

## 2020-10-17 DIAGNOSIS — N399 Disorder of urinary system, unspecified: Secondary | ICD-10-CM | POA: Insufficient documentation

## 2020-10-17 DIAGNOSIS — N390 Urinary tract infection, site not specified: Secondary | ICD-10-CM | POA: Diagnosis not present

## 2020-10-17 LAB — URINALYSIS, ROUTINE W REFLEX MICROSCOPIC
Bacteria, UA: NONE SEEN
Bilirubin Urine: NEGATIVE
Glucose, UA: >=500 mg/dL — AB
Ketones, ur: NEGATIVE mg/dL
Nitrite: NEGATIVE
Protein, ur: >=300 mg/dL — AB
Specific Gravity, Urine: 1.014 (ref 1.005–1.030)
pH: 7 (ref 5.0–8.0)

## 2020-10-19 LAB — URINE CULTURE: Culture: >=100000 — AB

## 2020-10-20 ENCOUNTER — Telehealth: Payer: Self-pay

## 2020-10-20 NOTE — Telephone Encounter (Signed)
Pt's wife reports pt was started on antibiotic yesterday for a UTI and inquired if this will interfere with his upcoming angiogram on 10/27/20. She also reports the blisters on top/bottom of foot has healed and if pt should be seen prior to angiogram. Advised it would be recommended for pt to continue with procedure as scheduled, without an appointment necessary prior. Voiced understanding

## 2020-10-21 ENCOUNTER — Other Ambulatory Visit: Payer: Self-pay | Admitting: *Deleted

## 2020-10-21 NOTE — Patient Outreach (Signed)
[facility]) Care Management  10/21/2020  Allen Yu 11-09-1945 [id number]   [facility] Unsuccessful outreach   Outreach attempt to [phone] No answer. [facility] RN CM left HIPAA [facility] Portability and Accountability Act) compliant voicemail message along with CM's contact info.   Plan: [facility] RN CM scheduled this patient for another call attempt within 30 business days Had successful outreach to wife on 10/06/20 Unsuccessful outreach on 10/21/20   [name], RN, BSN, [place] Coordinator Office number [phone] Mobile number [phone]  Main [facility] number [phone] Fax number [phone]

## 2020-10-24 DIAGNOSIS — J9600 Acute respiratory failure, unspecified whether with hypoxia or hypercapnia: Secondary | ICD-10-CM | POA: Diagnosis not present

## 2020-10-24 DIAGNOSIS — E1122 Type 2 diabetes mellitus with diabetic chronic kidney disease: Secondary | ICD-10-CM | POA: Diagnosis not present

## 2020-10-24 DIAGNOSIS — Z23 Encounter for immunization: Secondary | ICD-10-CM | POA: Diagnosis not present

## 2020-10-24 DIAGNOSIS — I739 Peripheral vascular disease, unspecified: Secondary | ICD-10-CM | POA: Diagnosis not present

## 2020-10-27 ENCOUNTER — Ambulatory Visit (HOSPITAL_COMMUNITY)
Admission: RE | Admit: 2020-10-27 | Discharge: 2020-10-27 | Disposition: A | Discharge status: Home / Self Care | Payer: Medicare Other | Attending: Vascular Surgery | Admitting: Vascular Surgery

## 2020-10-27 ENCOUNTER — Other Ambulatory Visit: Payer: Self-pay

## 2020-10-27 ENCOUNTER — Encounter (HOSPITAL_COMMUNITY)
Admission: RE | Disposition: A | Discharge status: Home / Self Care | Payer: Self-pay | Source: Home / Self Care | Attending: Vascular Surgery

## 2020-10-27 DIAGNOSIS — Z888 Allergy status to other drugs, medicaments and biological substances status: Secondary | ICD-10-CM | POA: Insufficient documentation

## 2020-10-27 DIAGNOSIS — I70221 Atherosclerosis of native arteries of extremities with rest pain, right leg: Secondary | ICD-10-CM | POA: Diagnosis not present

## 2020-10-27 DIAGNOSIS — Z89612 Acquired absence of left leg above knee: Secondary | ICD-10-CM | POA: Insufficient documentation

## 2020-10-27 DIAGNOSIS — Z7984 Long term (current) use of oral hypoglycemic drugs: Secondary | ICD-10-CM | POA: Insufficient documentation

## 2020-10-27 DIAGNOSIS — Z794 Long term (current) use of insulin: Secondary | ICD-10-CM | POA: Insufficient documentation

## 2020-10-27 DIAGNOSIS — Z79899 Other long term (current) drug therapy: Secondary | ICD-10-CM | POA: Insufficient documentation

## 2020-10-27 DIAGNOSIS — Z885 Allergy status to narcotic agent status: Secondary | ICD-10-CM | POA: Diagnosis not present

## 2020-10-27 DIAGNOSIS — I70211 Atherosclerosis of native arteries of extremities with intermittent claudication, right leg: Secondary | ICD-10-CM | POA: Diagnosis not present

## 2020-10-27 DIAGNOSIS — F17210 Nicotine dependence, cigarettes, uncomplicated: Secondary | ICD-10-CM | POA: Insufficient documentation

## 2020-10-27 DIAGNOSIS — E1151 Type 2 diabetes mellitus with diabetic peripheral angiopathy without gangrene: Secondary | ICD-10-CM | POA: Diagnosis not present

## 2020-10-27 HISTORY — PX: ABDOMINAL AORTOGRAM W/LOWER EXTREMITY: CATH118223

## 2020-10-27 HISTORY — PX: PERIPHERAL VASCULAR INTERVENTION: CATH118257

## 2020-10-27 LAB — POCT I-STAT, CHEM 8
BUN: 21 mg/dL (ref 8–23)
Calcium, Ion: 1.16 mmol/L (ref 1.15–1.40)
Chloride: 95 mmol/L — ABNORMAL LOW (ref 98–111)
Creatinine, Ser: 1.1 mg/dL (ref 0.61–1.24)
Glucose, Bld: 368 mg/dL — ABNORMAL HIGH (ref 70–99)
HCT: 25 % — ABNORMAL LOW (ref 39.0–52.0)
Hemoglobin: 8.5 g/dL — ABNORMAL LOW (ref 13.0–17.0)
Potassium: 3.5 mmol/L (ref 3.5–5.1)
Sodium: 139 mmol/L (ref 135–145)
TCO2: 32 mmol/L (ref 22–32)

## 2020-10-27 LAB — POCT ACTIVATED CLOTTING TIME: Activated Clotting Time: 196 seconds

## 2020-10-27 LAB — GLUCOSE, CAPILLARY: Glucose-Capillary: 317 mg/dL — ABNORMAL HIGH (ref 70–99)

## 2020-10-27 SURGERY — ABDOMINAL AORTOGRAM W/LOWER EXTREMITY
Anesthesia: LOCAL

## 2020-10-27 MED ORDER — CLOPIDOGREL BISULFATE 75 MG PO TABS: 300.0000 mg | ORAL_TABLET | Freq: Once | ORAL | Status: DC

## 2020-10-27 MED ORDER — HEPARIN SODIUM (PORCINE) 1000 UNIT/ML IJ SOLN
INTRAMUSCULAR | Status: DC | PRN
  Administered 2020-10-27: 3000 [IU] via INTRAVENOUS
  Administered 2020-10-27: 6000 [IU] via INTRAVENOUS

## 2020-10-27 MED ORDER — LABETALOL HCL 5 MG/ML IV SOLN: 10.0000 mg | INTRAVENOUS | Status: DC | PRN

## 2020-10-27 MED ORDER — SODIUM CHLORIDE 0.9 % IV SOLN: INTRAVENOUS | Status: DC

## 2020-10-27 MED ORDER — ACETAMINOPHEN 325 MG PO TABS: 650.0000 mg | ORAL_TABLET | ORAL | Status: DC | PRN

## 2020-10-27 MED ORDER — HEPARIN (PORCINE) IN NACL 1000-0.9 UT/500ML-% IV SOLN
INTRAVENOUS | Status: DC | PRN
  Administered 2020-10-27 (×2): 500 mL

## 2020-10-27 MED ORDER — LIDOCAINE HCL (PF) 1 % IJ SOLN
INTRAMUSCULAR | Status: DC | PRN
  Administered 2020-10-27: 20 mL via INTRADERMAL

## 2020-10-27 MED ORDER — INSULIN ASPART 100 UNIT/ML IJ SOLN
8.0000 [IU] | Freq: Once | INTRAMUSCULAR | Status: AC
  Administered 2020-10-27: 8 [IU] via SUBCUTANEOUS
  Filled 2020-10-27: qty 0.08

## 2020-10-27 MED ORDER — CLOPIDOGREL BISULFATE 75 MG PO TABS: 75.0000 mg | ORAL_TABLET | Freq: Every day | ORAL | 11 refills | Status: DC

## 2020-10-27 MED ORDER — CLOPIDOGREL BISULFATE 75 MG PO TABS: 75.0000 mg | ORAL_TABLET | Freq: Every day | ORAL | Status: DC

## 2020-10-27 MED ORDER — ONDANSETRON HCL 4 MG/2ML IJ SOLN: 4.0000 mg | Freq: Four times a day (QID) | INTRAMUSCULAR | Status: DC | PRN

## 2020-10-27 MED ORDER — ASPIRIN EC 81 MG PO TBEC: 81.0000 mg | DELAYED_RELEASE_TABLET | Freq: Every day | ORAL | Status: DC

## 2020-10-27 MED ORDER — HEPARIN (PORCINE) IN NACL 1000-0.9 UT/500ML-% IV SOLN
INTRAVENOUS | Status: AC
  Filled 2020-10-27: qty 1000

## 2020-10-27 MED ORDER — INSULIN ASPART 100 UNIT/ML IJ SOLN
INTRAMUSCULAR | Status: AC
  Filled 2020-10-27: qty 1

## 2020-10-27 MED ORDER — FENTANYL CITRATE (PF) 100 MCG/2ML IJ SOLN
INTRAMUSCULAR | Status: AC
  Filled 2020-10-27: qty 2

## 2020-10-27 MED ORDER — SODIUM CHLORIDE 0.9% FLUSH: 3.0000 mL | INTRAVENOUS | Status: DC | PRN

## 2020-10-27 MED ORDER — LIDOCAINE HCL (PF) 1 % IJ SOLN
INTRAMUSCULAR | Status: AC
  Filled 2020-10-27: qty 30

## 2020-10-27 MED ORDER — SODIUM CHLORIDE 0.9% FLUSH: 3.0000 mL | Freq: Two times a day (BID) | INTRAVENOUS | Status: DC

## 2020-10-27 MED ORDER — CLOPIDOGREL BISULFATE 300 MG PO TABS
ORAL_TABLET | ORAL | Status: AC
  Filled 2020-10-27: qty 1

## 2020-10-27 MED ORDER — IODIXANOL 320 MG/ML IV SOLN
INTRAVENOUS | Status: DC | PRN
  Administered 2020-10-27: 90 mL via INTRA_ARTERIAL

## 2020-10-27 MED ORDER — HYDRALAZINE HCL 20 MG/ML IJ SOLN: 5.0000 mg | INTRAMUSCULAR | Status: DC | PRN

## 2020-10-27 MED ORDER — MIDAZOLAM HCL 2 MG/2ML IJ SOLN
INTRAMUSCULAR | Status: AC
  Filled 2020-10-27: qty 2

## 2020-10-27 MED ORDER — CLOPIDOGREL BISULFATE 300 MG PO TABS
ORAL_TABLET | ORAL | Status: DC | PRN
  Administered 2020-10-27: 300 mg via ORAL

## 2020-10-27 MED ORDER — HEPARIN SODIUM (PORCINE) 1000 UNIT/ML IJ SOLN
INTRAMUSCULAR | Status: AC
  Filled 2020-10-27: qty 1

## 2020-10-27 MED ORDER — MIDAZOLAM HCL 2 MG/2ML IJ SOLN
INTRAMUSCULAR | Status: DC | PRN
  Administered 2020-10-27 (×3): 1 mg via INTRAVENOUS

## 2020-10-27 MED ORDER — ASPIRIN EC 81 MG PO TBEC: 81.0000 mg | DELAYED_RELEASE_TABLET | Freq: Every day | ORAL | 2 refills | Status: DC

## 2020-10-27 MED ORDER — SODIUM CHLORIDE 0.9 % IV SOLN: 250.0000 mL | INTRAVENOUS | Status: DC | PRN

## 2020-10-27 MED ORDER — FENTANYL CITRATE (PF) 100 MCG/2ML IJ SOLN
INTRAMUSCULAR | Status: DC | PRN
  Administered 2020-10-27 (×3): 25 ug via INTRAVENOUS

## 2020-10-27 SURGICAL SUPPLY — 25 items
BALL STERLING OTW 2.5X100X150 (BALLOONS) ×3
BALLN STERLING OTW 2.5X100X150 (BALLOONS) ×2
BALLN STERLING OTW 4X220X150 (BALLOONS) ×3
BALLOON STERLING OTW 4X220X150 (BALLOONS) IMPLANT
BALLOON STRLNG OTW 2.5X100X150 (BALLOONS) IMPLANT
CATH CROSS OVER TEMPO 5F (CATHETERS) ×1 IMPLANT
CATH OMNI FLUSH 5F 65CM (CATHETERS) ×1 IMPLANT
CATH QUICKCROSS .035X135CM (MICROCATHETER) ×1 IMPLANT
DCB RANGER 4.0X200 150 (BALLOONS) IMPLANT
DEVICE CLOSURE MYNXGRIP 6/7F (Vascular Products) ×1 IMPLANT
GLIDEWIRE ADV .035X260CM (WIRE) ×1 IMPLANT
GLIDEWIRE ANGLED NITR .018X260 (WIRE) ×1 IMPLANT
GUIDEWIRE ANGLED .035X150CM (WIRE) ×1 IMPLANT
KIT MICROPUNCTURE NIT STIFF (SHEATH) ×1 IMPLANT
KIT PV (KITS) ×3 IMPLANT
RANGER DCB 4.0X200 150 (BALLOONS) ×3
SHEATH FLEX ANSEL ANG 6F 45CM (SHEATH) ×1 IMPLANT
SHEATH PINNACLE 5F 10CM (SHEATH) ×1 IMPLANT
SHEATH PINNACLE 6F 10CM (SHEATH) ×1 IMPLANT
SHEATH PROBE COVER 6X72 (BAG) ×1 IMPLANT
SYR MEDRAD MARK V 150ML (SYRINGE) ×1 IMPLANT
TRANSDUCER W/STOPCOCK (MISCELLANEOUS) ×3 IMPLANT
TRAY PV CATH (CUSTOM PROCEDURE TRAY) ×3 IMPLANT
WIRE BENTSON .035X145CM (WIRE) ×1 IMPLANT
WIRE G V18X300CM (WIRE) ×4 IMPLANT

## 2020-10-27 NOTE — H&P (Signed)
H&P    History of Present Illness: This is a 75 y.o. male  who is s/p left AKA on 06/28/2020 by Dr. Donnetta Hutching.     He had presented with critical limb ischemia and a heel ulcer.  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  He underwent left femoral to above-knee popliteal bypass with Gore-Tex on 05/27/2020.     He is now having pain in the right foot according to his wife.  I spoke with her on the phone.  Aortogram on 05/20/2020 revealed that the right leg had diffusely diseased multiple segment 90% stenosis SFA diffusely diseased popliteal one-vessel peroneal runoff to the right foot.  Past Medical History:  Diagnosis Date   Anemia    Arthritis    Carotid stenosis    Chronic back pain    Chronic kidney disease    Constipation    Dementia (HCC)    Diabetes mellitus    GERD (gastroesophageal reflux disease)    History of kidney stones    Hypertension    Lung nodule    PAF (paroxysmal atrial fibrillation) (HCC)    Peripheral neuropathy    Peripheral vascular disease (HCC)    PTSD (post-traumatic stress disorder)    Schizophrenia ([place])    Tuberculosis    Treated    Past Surgical History:  Procedure Laterality Date   ABDOMINAL AORTOGRAM W/LOWER EXTREMITY N/A 05/20/2020   Procedure: ABDOMINAL AORTOGRAM W/LOWER EXTREMITY;  Surgeon: Elam Dutch, MD;  Location: [facility];  Service: Cardiovascular;  Laterality: N/A;   AMPUTATION Left 06/28/2020   Procedure: AMPUTATION ABOVE KNEE LEFT;  Surgeon: Rosetta Posner, MD;  Location: [facility] OR;  Service: Vascular;  Laterality: Left;   [place], 2013   x2   BLADDER SURGERY     02   CHOLECYSTECTOMY     COLONOSCOPY     COLONOSCOPY N/A 12/07/2014   Procedure: COLONOSCOPY;  Surgeon: Daneil Dolin, MD;  Location[facility];  Service: Endoscopy;  Laterality: N/A;  10:30 Am   EYE SURGERY Bilateral    removed metal from eye   FEMORAL-TIBIAL BYPASS GRAFT Left 05/27/2020   Procedure:  LEFT FEMORAL TO PERONEAL ARTERY BYPASS;  Surgeon: Rosetta Posner, MD;  Location: [facility];  Service: Vascular;  Laterality: Left;   HERNIA REPAIR Right    INGUINAL HERNIA REPAIR Left 11/03/2013   Procedure: HERNIA REPAIR INGUINAL ADULT;  Surgeon: Gayland Curry, MD;  Location: [facility];  Service: General;  Laterality: Left;   SHOULDER SURGERY     RIGHT SHOULDER    VIDEO BRONCHOSCOPY WITH ENDOBRONCHIAL NAVIGATION N/A 07/10/2019   Procedure: VIDEO BRONCHOSCOPY WITH ENDOBRONCHIAL NAVIGATION;  Surgeon: Melrose Nakayama, MD;  Location: [facility] OR;  Service: Thoracic;  Laterality: N/A;    Allergies  Allergen Reactions   Bee Venom Anaphylaxis   Codeine Other (See Comments)    incoherent  Other reaction(s): Delirium   Propoxyphene Other (See Comments)    Dizziness, "Makes me feel drunk" Other reaction(s): Dizziness   Valsartan Other (See Comments)    incoherent Other reaction(s): Delirium    Prior to Admission medications   Medication Sig Start Date End Date Taking? Authorizing Provider  acetaminophen (TYLENOL) 500 MG tablet Take 1,000 mg by mouth every 6 (six) hours as needed for moderate pain.   Yes [provider]  atorvastatin (LIPITOR) 40 MG tablet Take 40 mg by mouth at bedtime.   Yes [provider]  Bacitracin-Polymyxin B (NEOSPORIN EX) Apply 1 application topically daily as needed (wound care).   Yes [provider]  cefpodoxime (VANTIN) 100 MG tablet Take 100 mg by mouth 2 (two) times daily.   Yes [provider]  donepezil (ARICEPT) 10 MG tablet Take 10 mg by mouth at bedtime.  06/28/16  Yes [provider]  finasteride (PROSCAR) 5 MG tablet Take 5 mg by mouth daily. 08/23/20  Yes [provider]  gabapentin (NEURONTIN) 100 MG capsule Take 100 mg by mouth at bedtime. 11/06/19  Yes [provider]  LANTUS 100 UNIT/ML injection Inject 0.1 mLs (10 Units total) into the skin at bedtime as needed (High blood glucose). Patient taking  differently: Inject 25 Units into the skin at bedtime. 06/30/20  Yes Mercy Riding, MD  levofloxacin (LEVAQUIN) 500 MG tablet Take 1 tablet (500 mg total) by mouth daily. 09/29/20  Yes Tat, Shanon Brow, MD  metFORMIN (GLUCOPHAGE) 500 MG tablet Take by mouth 2 (two) times daily with a meal. Take 1 daily for 7 days then 2 times daily   Yes [provider]  metoprolol tartrate (LOPRESSOR) 25 MG tablet Take 1 tablet (25 mg total) by mouth 2 (two) times daily. 06/30/20 06/30/21 Yes Mercy Riding, MD  mirtazapine (REMERON) 15 MG tablet Take 15 mg by mouth at bedtime. 08/12/20  Yes [provider]  risperiDONE (RISPERDAL) 0.5 MG tablet Take 1 tablet (0.5 mg total) by mouth 2 (two) times daily. 09/29/20  Yes Tat, Shanon Brow, MD  tamsulosin (FLOMAX) 0.4 MG CAPS capsule Take 1 capsule (0.4 mg total) by mouth daily. 07/26/20  Yes Tat, Shanon Brow, MD  traMADol (ULTRAM) 50 MG tablet Take 50 mg by mouth every 6 (six) hours as needed for moderate pain. 07/18/20  Yes [provider]  triamcinolone ointment (KENALOG) 0.1 % Apply 1 application topically daily. Apply to foot   Yes [provider]  Vitamin D, Ergocalciferol, (DRISDOL) 1.25 MG (50000 UNIT) CAPS capsule Take 50,000 Units by mouth every Friday.   Yes [provider]  B-D UF III MINI PEN NEEDLES 31G X 5 MM MISC SMARTSIG:1 Each SUB-Q Daily 12/23/19   [provider]  nicotine (NICODERM CQ - DOSED IN MG/24 HOURS) 21 mg/24hr patch Place 1 patch (21 mg total) onto the skin daily as needed (Cessation of smoking). Patient not taking: No sig reported 06/30/20   Mercy Riding, MD    Social History   Socioeconomic History   Marital status: Married    Spouse name: [name]   Number of children: Not on file   Years of education: Not on file   Highest education level: Not on file  Occupational History   Not on file  Tobacco Use   Smoking status: Every Day    Packs/day: 0.50    Types: Cigarettes   Smokeless tobacco: Never   Tobacco  comments:    burns them up  Vaping Use   Vaping Use: Never used  Substance and Sexual Activity   Alcohol use: No    Alcohol/week: 0.0 standard drinks   Drug use: Yes    Types: Marijuana    Comment: almost daily for pain   Sexual activity: Never  Other Topics Concern   Not on file  Social History Narrative   Not on file   Social Determinants of Health   Financial Resource Strain: Not on file  Food Insecurity: No Food Insecurity   Worried About [place] in the  Last Year: Never true   Ran Out of Food in the Last Year: Never true  Transportation Needs: No Transportation Needs   Lack of Transportation (Medical): No   Lack of Transportation (Non-Medical): No  Physical Activity: Not on file  Stress: No Stress Concern Present   Feeling of Stress : Only a little  Social Connections: Not on file  Intimate Partner Violence: Not At Risk   Fear of Current or Ex-Partner: No   Emotionally Abused: No   Physically Abused: No   Sexually Abused: No     Family History  Problem Relation Age of Onset   Heart disease Mother        before age 72    ROS: [x]  Positive   [ ]  Negative   [ ]  All sytems reviewed and are negative  Cardiovascular: []  chest pain/pressure []  palpitations []  SOB lying flat []  DOE []  pain in legs while walking []  pain in legs at rest []  pain in legs at night []  non-healing ulcers []  hx of DVT []  swelling in legs  Pulmonary: []  productive cough []  asthma/wheezing []  home O2  Neurologic: []  weakness in []  arms []  legs []  numbness in []  arms []  legs []  hx of CVA []  mini stroke [] difficulty speaking or slurred speech []  temporary loss of vision in one eye []  dizziness  Hematologic: []  hx of cancer []  bleeding problems []  problems with blood clotting easily  Endocrine:   []  diabetes []  thyroid disease  GI []  vomiting blood []  blood in stool  GU: []  CKD/renal failure []  HD--[]  M/W/F or []  T/T/S []  burning with urination []  blood  in urine  Psychiatric: []  anxiety []  depression  Musculoskeletal: []  arthritis []  joint pain  Integumentary: []  rashes []  ulcers  Constitutional: []  fever []  chills   Physical Examination  Vitals:   10/27/20 0829  BP: (!) 153/88  Pulse: 97  Temp: 98.8 F (37.1 C)  SpO2: 99%   Body mass index is 17.13 kg/m.  General:  WDWN in NAD Gait: Not observed HENT: WNL, normocephalic Pulmonary: normal non-labored breathing, without Rales, rhonchi,  wheezing Cardiac: regular, without  Murmurs, rubs or gallops Abdomen: soft, NT/ND Vascular Exam/Pulses: Bilateral femoral pulses palpable Left above-knee amputation Right foot warm with no tissue loss except some pretibial wounds   CBC    Component Value Date/Time   WBC 8.6 09/29/2020 0536   RBC 2.47 (L) 09/29/2020 0536   HGB 8.5 (L) 10/27/2020 0924   HCT 25.0 (L) 10/27/2020 0924   HCT 31.3 (L) 07/09/2015 0757   PLT 323 09/29/2020 0536   MCV 91.1 09/29/2020 0536   MCH 30.0 09/29/2020 0536   MCHC 32.9 09/29/2020 0536   RDW 16.7 (H) 09/29/2020 0536   LYMPHSABS 1.2 09/29/2020 0536   MONOABS 0.8 09/29/2020 0536   EOSABS 0.2 09/29/2020 0536   BASOSABS 0.0 09/29/2020 0536    BMET    Component Value Date/Time   NA 139 10/27/2020 0924   K 3.5 10/27/2020 0924   CL 95 (L) 10/27/2020 0924   CO2 26 09/29/2020 0536   GLUCOSE 368 (H) 10/27/2020 0924   BUN 21 10/27/2020 0924   CREATININE 1.10 10/27/2020 0924   CREATININE 1.10 11/26/2019 0934   CALCIUM 8.3 (L) 09/29/2020 0536   GFRNONAA >60 09/29/2020 0536   GFRAA >60 07/10/2019 0702    COAGS: Lab Results  Component Value Date   INR 1.6 (H) 09/25/2020   INR 1.3 (H) 09/24/2020   INR 1.2 08/15/2020  Non-Invasive Vascular Imaging:    ABIs 09/21/2020 1.17 biphasic   ASSESSMENT/PLAN: This is a 75 y.o. male with multiple comorbidities that presents for aortogram lower extremity arteriogram and possible intervention of the right leg.  In discussing with the  patient's wife he has been having significant foot pain in his nursing facility.  He has known diffuse SFA and tibial disease.  We will plan to proceed after risk benefits discussed.  Marty Heck, MD [facility] [place] Office: [place]

## 2020-10-27 NOTE — Progress Notes (Signed)
I stat cbg [place], rn informed

## 2020-10-27 NOTE — Op Note (Signed)
Patient name: Allen Yu MRN: [id number] DOB: 01/11/1946 Sex: male  10/27/2020 Pre-operative Diagnosis: Critical limb ischemia right lower extremity with rest pain Post-operative diagnosis:  Same Surgeon:  Marty Heck, MD Procedure Performed: 1.  Ultrasound-guided access left common femoral artery 2.  Aortogram including catheter selection of aorta 3.  Right lower extremity arteriogram with selection of third order branches 4.  Right peroneal and TP trunk angioplasty (2.5 mm x 100 mm Sterling) 5.  Right SFA and above-knee popliteal angioplasty (4 mm x 220 mm Sterling and 4 mm x 200 mm drug-coated Ranger) 6.  Mynx closure of the left common femoral artery 7.  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  Most recently he presents with now right lower extremity pain in the foot consistent with rest pain.  He is here today for aortogram and lower extremity arteriogram.  Risk benefits discussed with patient's wife.  Findings:   Aortogram showed patent renal arteries bilaterally and heavily calcified infrarenal aorta and bilateral iliac arteries with no flow-limiting stenosis.  Right lower extremity arteriogram showed a patent common femoral and profunda.  SFA was diffusely diseased and heavily calcified.  He had long segment high-grade stenosis throughout the mid to distal segment including in the above-knee and behind the knee popliteal artery over about a 200 mm segment.  The below-knee popliteal artery was patent.  He had single-vessel runoff via peroneal with a diffusely diseased TP trunk and proximal peroneal artery again with fairly long segment high-grade calcified stenosis greater than 80%.  The artery look much healthier distally in the mid to distal calf.  I was able to cross all  of the right SFA popliteal disease.  I was also able to cross the TP trunk and into the peroneal distally although this was quite difficult.  The proximal peroneal TP trunk was angioplastied with 2.5 mm Sterling.  The entire mid to distal SFA as well as the above-knee and behind the knee popliteal artery was angioplastied with a 4 mm Sterling to nominal pressure for 2 minutes and then I treated this with a drug-coated balloon.  There was no evidence of residual high-grade stenosis.  He had significant residual disease that did not appear flow-limiting.    We elected to stop given he was combative and not cooperative at all during the case requiring restraints due to his dementia.   Procedure:  The patient was identified in the holding area and taken to room 8.  The patient was then placed supine on the table and prepped and draped in the usual sterile fashion.  A time out was called.  Ultrasound was used to evaluate the left common femoral artery.  It was patent .  A digital ultrasound image was acquired.  A micropuncture needle was used to access the left common femoral artery under ultrasound guidance.  An 018 wire was advanced without resistance and a micropuncture sheath was placed.  The 018 wire was removed and a benson wire was placed.  The micropuncture sheath was exchanged for a 5 french sheath.  An omniflush catheter was advanced over the wire to the level of L-1.  An abdominal angiogram was obtained.  Next, using the omniflush catheter and a benson wire, the aortic bifurcation was crossed and the catheter was placed into the right  external iliac artery and right runoff was obtained.  After evaluating images elected to try and cross all the SFA popliteal and tibial disease so that we could treat this to try and alleviate his rest pain.  I used a Glidewire advantage down the right SFA and we put a long 6 [place] Ansell sheath in the left groin over the aortic bifurcation.  Patient was given 200 units per  kilogram IV heparin.  I then used the Glidewire advantage with a quick cross to get all the way down the SFA popliteal disease into the below-knee popliteal artery.  I then downsized to an 018 system and used a V 18 wire with a 018 quick cross.  Getting across the TP trunk disease was very difficult given multiple collaterals and high-grade stenosis that the wire wanted to track out collateral branches.  Finally I was able to get down the true lumen of the peroneal and distal to the disease and we confirmed we were in the true lumen.  We then treated the proximal peroneal and TP trunk with a 2.5 mm x 100 mm Sterling to nominal pressure for 2 minutes.  I then treated the above-knee popliteal artery all the way through the distal to mid SFA with a long 4 mm Sterling to nominal pressure for 2 minutes.  I elected to treat the SFA popliteal with a drug-coated balloon and used a long 4 mm Ranger from behind the knee popliteal artery all the way up to the mid SFA to nominal pressure for 3 minutes.  At this point time he was becoming very combative and difficult to keep restrained.  I did not feel that prolonging the case was beneficial at this point and we had a fairly good results.  There was no significant residual stenosis.  Wires and catheters were removed and we put a short 6 French sheath in the left groin.  A left groin arteriogram was obtained.  A mynx closure device was deployed.  Plan: Patient will follow-up in 1 month with right leg arterial duplex and ABIs.  He will need dual antiplatelet therapy on aspirin Plavix and is already on statin.  Marty Heck, MD [facility] Office: [place]

## 2020-10-27 NOTE — Progress Notes (Signed)
AVS did not indicate which pharmacy medications were sent to. Pt's wife states pt is not from a facility. MD notified and asked to send prescriptions to [facility] on [address] in [place]. Pt's wife informed that MD was notified and will send prescriptions to requested pharmacy; however, pt's wife did not want to wait to verify that prescriptions were sent to correct pharmacy.

## 2020-10-28 ENCOUNTER — Encounter (HOSPITAL_COMMUNITY): Payer: Self-pay | Admitting: Vascular Surgery

## 2020-10-31 ENCOUNTER — Telehealth: Payer: Self-pay

## 2020-10-31 ENCOUNTER — Other Ambulatory Visit: Payer: Self-pay

## 2020-10-31 DIAGNOSIS — I739 Peripheral vascular disease, unspecified: Secondary | ICD-10-CM

## 2020-10-31 NOTE — Telephone Encounter (Signed)
Patient had aogram with intervention on 10/6 - since yesterday, wife has noticed decreased movement in the right foot. Patient also c/o pain - right foot is a little swollen. Discussed with PA, added on to first available appt patient able to make on Wednesday w/ultrasound and ABI.

## 2020-11-02 ENCOUNTER — Other Ambulatory Visit: Payer: Self-pay

## 2020-11-02 ENCOUNTER — Ambulatory Visit (HOSPITAL_COMMUNITY)
Admission: RE | Admit: 2020-11-02 | Discharge: 2020-11-02 | Disposition: A | Discharge status: Home / Self Care | Payer: Medicare Other | Source: Ambulatory Visit | Attending: Vascular Surgery | Admitting: Vascular Surgery

## 2020-11-02 ENCOUNTER — Encounter: Payer: Self-pay | Admitting: Physician Assistant

## 2020-11-02 ENCOUNTER — Ambulatory Visit (INDEPENDENT_AMBULATORY_CARE_PROVIDER_SITE_OTHER)
Admission: RE | Admit: 2020-11-02 | Discharge: 2020-11-02 | Disposition: A | Discharge status: Home / Self Care | Payer: Medicare Other | Source: Ambulatory Visit | Attending: Vascular Surgery | Admitting: Vascular Surgery

## 2020-11-02 ENCOUNTER — Ambulatory Visit (INDEPENDENT_AMBULATORY_CARE_PROVIDER_SITE_OTHER): Payer: Medicare Other | Admitting: Physician Assistant

## 2020-11-02 VITALS — BP 144/86 | HR 90 | Temp 98.0°F | Resp 14 | Ht 73.0 in

## 2020-11-02 DIAGNOSIS — I739 Peripheral vascular disease, unspecified: Secondary | ICD-10-CM | POA: Insufficient documentation

## 2020-11-02 NOTE — Progress Notes (Signed)
HISTORY AND PHYSICAL     CC:  follow up. Requesting Provider:  [name], MD  HPI: This is a 75 y.o. male who is here today for follow up for PAD.  On 10/27/2020, he underwent aortogram with right peroneal and TP trunk angioplasty, right SFA and AK popliteal angioplasty by Dr. Carlis Abbott.  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  Most recently he presented with now right lower extremity pain in the foot consistent with rest pain.    Carotid duplex in April 2022 was 1-39% bilateral ICA stenosis.  The pt returns today for follow up accompanied by his wife.   She tells me that he has been complaining about pain in his foot.  She tells me that his foot has has much improvement since the arteriogram and intervention.  She states he does have some swelling around the ankle.  He sits in the wheelchair all day and does not like to elevate his leg and therefore does not.  Given the increase pain, she brought him in to be evaluated.    The pt is on a statin for cholesterol management.    The pt is on an aspirin.    Other AC:  Plavix The pt is on BB for hypertension.  The pt does have diabetes. Tobacco hx:  current - she states that he does smoke but most of the time, will let them burn out.      Past Medical History:  Diagnosis Date   Anemia    Arthritis    Carotid stenosis    Chronic back pain    Chronic kidney disease    Constipation    Dementia (HCC)    Diabetes mellitus    GERD (gastroesophageal reflux disease)    History of kidney stones    Hypertension    Lung nodule    PAF (paroxysmal atrial fibrillation) (HCC)    Peripheral neuropathy    Peripheral vascular disease (HCC)    PTSD (post-traumatic stress disorder)    Schizophrenia ([place])    Tuberculosis    Treated    Past Surgical History:  Procedure Laterality Date   ABDOMINAL AORTOGRAM W/LOWER EXTREMITY N/A 05/20/2020   Procedure: ABDOMINAL AORTOGRAM W/LOWER EXTREMITY;   Surgeon: Elam Dutch, MD;  Location: [facility];  Service: Cardiovascular;  Laterality: N/A;   ABDOMINAL AORTOGRAM W/LOWER EXTREMITY N/A 10/27/2020   Procedure: ABDOMINAL AORTOGRAM W/LOWER EXTREMITY;  Surgeon: Marty Heck, MD;  Location: [facility];  Service: Cardiovascular;  Laterality: N/A;   AMPUTATION Left 06/28/2020   Procedure: AMPUTATION ABOVE KNEE LEFT;  Surgeon: Rosetta Posner, MD;  Location: [facility] OR;  Service: Vascular;  Laterality: Left;   [place], 2013   x2   BLADDER SURGERY     02   CHOLECYSTECTOMY     COLONOSCOPY     COLONOSCOPY N/A 12/07/2014   Procedure: COLONOSCOPY;  Surgeon: Daneil Dolin, MD;  [facility];  Service: Endoscopy;  Laterality: N/A;  10:30 Am   EYE SURGERY Bilateral    removed metal from eye   FEMORAL-TIBIAL BYPASS GRAFT Left 05/27/2020   Procedure: LEFT FEMORAL TO PERONEAL ARTERY BYPASS;  Surgeon: Rosetta Posner, MD;  Location: [facility];  Service: Vascular;  Laterality: Left;   HERNIA REPAIR Right    INGUINAL HERNIA REPAIR Left 11/03/2013   Procedure: HERNIA REPAIR INGUINAL ADULT;  Surgeon: Randall Hiss  Ronnie Derby, MD;  Location: [facility];  Service: General;  Laterality: Left;   PERIPHERAL VASCULAR INTERVENTION  10/27/2020   Procedure: PERIPHERAL VASCULAR INTERVENTION;  Surgeon: Marty Heck, MD;  Location: [facility];  Service: Cardiovascular;;   SHOULDER SURGERY     RIGHT SHOULDER    VIDEO BRONCHOSCOPY WITH ENDOBRONCHIAL NAVIGATION N/A 07/10/2019   Procedure: VIDEO BRONCHOSCOPY WITH ENDOBRONCHIAL NAVIGATION;  Surgeon: Melrose Nakayama, MD;  Location: [facility];  Service: Thoracic;  Laterality: N/A;    Allergies  Allergen Reactions   Bee Venom Anaphylaxis   Codeine Other (See Comments)    incoherent  Other reaction(s): Delirium   Propoxyphene Other (See Comments)    Dizziness, "Makes me feel drunk" Other reaction(s): Dizziness   Valsartan Other (See Comments)    incoherent Other  reaction(s): Delirium    Current Outpatient Medications  Medication Sig Dispense Refill   acetaminophen (TYLENOL) 500 MG tablet Take 1,000 mg by mouth every 6 (six) hours as needed for moderate pain.     aspirin EC 81 MG tablet Take 1 tablet (81 mg total) by mouth daily. Swallow whole. 150 tablet 2   atorvastatin (LIPITOR) 40 MG tablet Take 40 mg by mouth at bedtime.     B-D UF III MINI PEN NEEDLES 31G X 5 MM MISC SMARTSIG:1 Each SUB-Q Daily     Bacitracin-Polymyxin B (NEOSPORIN EX) Apply 1 application topically daily as needed (wound care).     cefpodoxime (VANTIN) 100 MG tablet Take 100 mg by mouth 2 (two) times daily.     clopidogrel (PLAVIX) 75 MG tablet Take 1 tablet (75 mg total) by mouth daily. 30 tablet 11   donepezil (ARICEPT) 10 MG tablet Take 10 mg by mouth at bedtime.      finasteride (PROSCAR) 5 MG tablet Take 5 mg by mouth daily.     gabapentin (NEURONTIN) 100 MG capsule Take 100 mg by mouth at bedtime.     LANTUS 100 UNIT/ML injection Inject 0.1 mLs (10 Units total) into the skin at bedtime as needed (High blood glucose). (Patient taking differently: Inject 25 Units into the skin at bedtime.) 10 mL 11   levofloxacin (LEVAQUIN) 500 MG tablet Take 1 tablet (500 mg total) by mouth daily. 8 tablet 0   metFORMIN (GLUCOPHAGE) 500 MG tablet Take by mouth 2 (two) times daily with a meal. Take 1 daily for 7 days then 2 times daily     metoprolol tartrate (LOPRESSOR) 25 MG tablet Take 1 tablet (25 mg total) by mouth 2 (two) times daily. 180 tablet 3   mirtazapine (REMERON) 15 MG tablet Take 15 mg by mouth at bedtime.     nicotine (NICODERM CQ - DOSED IN MG/24 HOURS) 21 mg/24hr patch Place 1 patch (21 mg total) onto the skin daily as needed (Cessation of smoking). (Patient not taking: No sig reported) 28 patch 0   risperiDONE (RISPERDAL) 0.5 MG tablet Take 1 tablet (0.5 mg total) by mouth 2 (two) times daily. 60 tablet 0   tamsulosin (FLOMAX) 0.4 MG CAPS capsule Take 1 capsule (0.4 mg  total) by mouth daily. 30 capsule 1   traMADol (ULTRAM) 50 MG tablet Take 50 mg by mouth every 6 (six) hours as needed for moderate pain.     triamcinolone ointment (KENALOG) 0.1 % Apply 1 application topically daily. Apply to foot     Vitamin D, Ergocalciferol, (DRISDOL) 1.25 MG (50000 UNIT) CAPS capsule Take 50,000 Units by mouth every Friday.  No current facility-administered medications for this visit.    Family History  Problem Relation Age of Onset   Heart disease Mother        before age 88    Social History   Socioeconomic History   Marital status: Married    Spouse name: [name]   Number of children: Not on file   Years of education: Not on file   Highest education level: Not on file  Occupational History   Not on file  Tobacco Use   Smoking status: Every Day    Packs/day: 0.50    Types: Cigarettes   Smokeless tobacco: Never   Tobacco comments:    burns them up  Vaping Use   Vaping Use: Never used  Substance and Sexual Activity   Alcohol use: No    Alcohol/week: 0.0 standard drinks   Drug use: Yes    Types: Marijuana    Comment: almost daily for pain   Sexual activity: Never  Other Topics Concern   Not on file  Social History Narrative   Not on file   Social Determinants of Health   Financial Resource Strain: Not on file  Food Insecurity: No Food Insecurity   Worried About Running Out of Food in the Last Year: Never true   Ran Out of Food in the Last Year: Never true  Transportation Needs: No Transportation Needs   Lack of Transportation (Medical): No   Lack of Transportation (Non-Medical): No  Physical Activity: Not on file  Stress: No Stress Concern Present   Feeling of Stress : Only a little  Social Connections: Not on file  Intimate Partner Violence: Not At Risk   Fear of Current or Ex-Partner: No   Emotionally Abused: No   Physically Abused: No   Sexually Abused: No     REVIEW OF SYSTEMS:   [X]  denotes positive finding, [ ]  denotes  negative finding Cardiac  Comments:  Chest pain or chest pressure:    Shortness of breath upon exertion:    Short of breath when lying flat:    Irregular heart rhythm:        Vascular    Pain in calf, thigh, or hip brought on by ambulation:    Pain in feet at night that wakes you up from your sleep:     Blood clot in your veins:    Leg swelling:         Pulmonary    Oxygen at home:    Productive cough:     Wheezing:         Neurologic    Sudden weakness in arms or legs:     Sudden numbness in arms or legs:     Sudden onset of difficulty speaking or slurred speech:    Temporary loss of vision in one eye:     Problems with dizziness:         Gastrointestinal    Blood in stool:     Vomited blood:         Genitourinary    Burning when urinating:     Blood in urine:        Psychiatric    Major depression:         Hematologic    Bleeding problems:    Problems with blood clotting too easily:        Skin    Rashes or ulcers:        Constitutional    Fever or chills:  PHYSICAL EXAMINATION:  Today's Vitals   11/02/20 1356 11/02/20 1400  BP: (!) 144/86   Pulse: 90   Resp: 14   Temp: 98 F (36.7 C)   TempSrc: Temporal   SpO2: 99%   Height: 6\' 1"  (1.854 m)   PainSc:  Asleep  PainLoc: Foot    Body mass index is 17.13 kg/m.   General:  WDWN in NAD; vital signs documented above Gait: Not observed HENT: WNL, normocephalic Pulmonary: normal non-labored breathing , without wheezing Cardiac: regular HR, without carotid bruits Abdomen: soft, NT, no masses; aortic pulse is not palpable Skin: without rashes Vascular Exam/Pulses: Brisk doppler signal right DP and peroneal; left femoral is palpable. Extremities:      Musculoskeletal: no muscle wasting or atrophy  Neurologic: A&O X 3 Psychiatric:  The pt has  flat  affect.   Non-Invasive Vascular Imaging:   ABI's/TBI's on 11/02/2020: Right:  1.02/0 - Great toe pressure: 0 Left:  AKA  Arterial duplex  on 11/02/2020: +----------+--------+-----+--------+-------------+-------------------------  RIGHT     PSV cm/sRatioStenosisWaveform     Comments                  +----------+--------+-----+--------+-------------+-------------------------  CFA Prox  92                   biphasic                               +----------+--------+-----+--------+-------------+-------------------------  DFA       93                   biphasic                               +----------+--------+-----+--------+-------------+-------------------------  SFA Prox  84                   biphasic                               +----------+--------+-----+--------+-------------+-------------------------  SFA Mid   58                   biphasic                               +----------+--------+-----+--------+-------------+-------------------------  SFA Distal62                   biphasic                               +----------+--------+-----+--------+-------------+-------------------------  POP Prox  63                   biphasic                               +----------+--------+-----+--------+-------------+-------------------------  POP Mid   107                  biphasic                               +----------+--------+-----+--------+-------------+-------------------------  POP Distal73  biphasic                               +----------+--------+-----+--------+-------------+-------------------------   TP Trunk  62                   biphasic                               +----------+--------+-----+--------+-------------+-------------------------  PTA Prox  61                   monophasic                             +----------+--------+-----+--------+-------------+-------------------------  PTA Mid   13                   mono/dampenedappears to fill by collateral   +----------+--------+-----+--------+-------------+-------------------------  PTA Distal             occluded                                       +----------+--------+-----+--------+-------------+-------------------------  PERO Prox 123                  biphasic                               +----------+--------+-----+--------+-------------+-------------------------   PERO Mid  39                   biphasic                               +----------+--------+-----+--------+-------------+-------------------------  A focal velocity elevation of 307 cm/s was obtained at mid SFA with post  stenotic turbulence with a VR of 5.3. Findings are characteristic of  50-74% stenosis. Irregular heavy plaque burden throughout the CFA and  SFA/popliteal arteries, and medial calcification of the tibial arteries. Angioplasty sites appear patent.    ASSESSMENT/PLAN:: 75 y.o. male here for follow up for PAD with hx right peroneal and TP trunk angioplasty, right SFA and AK popliteal angioplasty by Dr. Carlis Abbott on 10/27/2020.  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  Most recently he presented with now right lower extremity pain in the foot consistent with rest pain.    -pt duplex reveals that the peroneal artery is biphasic and patent.  He has a brisk right DP and peroneal doppler signal.  His wife tells me that just since the procedure last week, his foot looks much improved.   -given this, will have him keep his appt on 11/15 and cancel the arterial studies and check his foot at that time.  If all looks good, will schedule him for 3 month follow up.   -discussed importance of smoking cessation. -continue asa/plavix/statin -pt will call sooner if there are any issues.     Leontine Locket, [facility] [phone]  Clinic MD:   Donzetta Matters

## 2020-11-04 ENCOUNTER — Ambulatory Visit: Payer: TRICARE For Life (TFL)

## 2020-11-04 ENCOUNTER — Encounter (HOSPITAL_COMMUNITY): Payer: TRICARE For Life (TFL)

## 2020-11-21 ENCOUNTER — Other Ambulatory Visit: Payer: Self-pay

## 2020-11-21 ENCOUNTER — Other Ambulatory Visit: Payer: Self-pay | Admitting: *Deleted

## 2020-11-21 DIAGNOSIS — Z658 Other specified problems related to psychosocial circumstances: Secondary | ICD-10-CM | POA: Insufficient documentation

## 2020-11-21 DIAGNOSIS — H903 Sensorineural hearing loss, bilateral: Secondary | ICD-10-CM | POA: Insufficient documentation

## 2020-11-21 NOTE — Patient Outreach (Signed)
[facility]) Care Management  11/21/2020  Allen Yu Feb 13, 1945 [id number]  [facility] outreach to complex care patient   Allen Yu was referred to [facility] on 07/01/20 by [facility] hospital liaison for post hospital/complex care services after his left AKA (above the knee amputation) and for disease management of diabetes   Insurance: tricare for life (fully VA service connected), Medicare   Continues to do well with VA services and home personal care services   Offered the following resource to wife Allen Yu for dementia patients and family (In [place]) 2nd Thursdays 6:00 PM [employer] [email or web address]    plan Patient's wife agrees to care plan and follow up within the next 30 business days    Manoah Deckard L. [name], RN, BSN, [place] Coordinator Office number [phone] [facility] number [phone] Fax number [phone]

## 2020-11-24 DIAGNOSIS — E440 Moderate protein-calorie malnutrition: Secondary | ICD-10-CM | POA: Diagnosis not present

## 2020-11-24 DIAGNOSIS — Z8711 Personal history of peptic ulcer disease: Secondary | ICD-10-CM | POA: Diagnosis not present

## 2020-11-24 DIAGNOSIS — E1129 Type 2 diabetes mellitus with other diabetic kidney complication: Secondary | ICD-10-CM | POA: Diagnosis not present

## 2020-11-24 DIAGNOSIS — N401 Enlarged prostate with lower urinary tract symptoms: Secondary | ICD-10-CM | POA: Diagnosis not present

## 2020-11-28 NOTE — Progress Notes (Signed)
Cardiology Office Note    Date:  11/29/2020   ID:  Allen Yu, DOB [birth year], MRN [id number]  PCP:  Allen Noble, MD  Cardiologist: Rozann Lesches, MD    Chief Complaint  Patient presents with   Follow-up    2 month visit     History of Present Illness:    Allen Yu is a 75 y.o. male with past medical history of PAD (s/p L AKA in 06/2020), persistent atrial fibrillation (diagnosed in 07/2020 and converted to NSR with IV Amiodarone, recurrence in 09/2020), HTN, Type 2 DM, PTSD, Schizophrenia and dementia who presents to the office today for 81-month follow-up.   He was last examined by myself in 08/2020 following a recent admission for Klebsiella urosepsis and cardiology had followed the patient during admission due to atrial fibrillation with RVR. He had converted back to normal sinus rhythm with Amiodarone during admission and was maintaining normal sinus rhythm at the time of his office visit. He was encouraged to finish his current bottle of Amiodarone then discontinue as this has overall been recommended for him to have a short course of the medication. Given his worsening anemia and having declined further work-up of this, a mutual decision was made to discontinue Eliquis.  In the interim, he was admitted to [facility] from 9/3 - 09/29/2020 for sepsis in the setting of pneumonia and epididymitis. A repeat echocardiogram was obtained that admission and showed that his EF was reduced at 45% with inferior hypokinesis which was new when compared to prior imaging from 06/2020. Cardiology was not consulted at that time. He did follow-up with Vascular in the interim and underwent lower extremity angiography by Dr. Carlis Abbott on 10/27/2020 with right SFA and above-knee popliteal angioplasty performed.  Most history today is provided by the patient's spouse and she reports he has experienced pain along his right leg but that his wounds have started to improve. He denies any recent chest pain  or palpitations.  No recent orthopnea, PND or pitting edema.  Past Medical History:  Diagnosis Date   Anemia    Arthritis    Carotid stenosis    Chronic back pain    Chronic kidney disease    Constipation    Dementia (HCC)    Diabetes mellitus    GERD (gastroesophageal reflux disease)    History of kidney stones    Hypertension    Lung nodule    PAF (paroxysmal atrial fibrillation) (HCC)    Peripheral neuropathy    Peripheral vascular disease (HCC)    PTSD (post-traumatic stress disorder)    Schizophrenia ([place])    Tuberculosis    Treated    Past Surgical History:  Procedure Laterality Date   ABDOMINAL AORTOGRAM W/LOWER EXTREMITY N/A 05/20/2020   Procedure: ABDOMINAL AORTOGRAM W/LOWER EXTREMITY;  Surgeon: Elam Dutch, MD;  Location: [place] [facility];  Service: Cardiovascular;  Laterality: N/A;   ABDOMINAL AORTOGRAM W/LOWER EXTREMITY N/A 10/27/2020   Procedure: ABDOMINAL AORTOGRAM W/LOWER EXTREMITY;  Surgeon: Marty Heck, MD;  Location: [place] [facility];  Service: Cardiovascular;  Laterality: N/A;   AMPUTATION Left 06/28/2020   Procedure: AMPUTATION ABOVE KNEE LEFT;  Surgeon: Rosetta Posner, MD;  Location: [facility] OR;  Service: Vascular;  Laterality: Left;   [place]  2000, 2013   x2   BLADDER SURGERY     02   CHOLECYSTECTOMY     COLONOSCOPY     COLONOSCOPY N/A 12/07/2014  Procedure: COLONOSCOPY;  Surgeon: Daneil Dolin, MD;  Location: [facility];  Service: Endoscopy;  Laterality: N/A;  10:30 Am   EYE SURGERY Bilateral    removed metal from eye   FEMORAL-TIBIAL BYPASS GRAFT Left 05/27/2020   Procedure: LEFT FEMORAL TO PERONEAL ARTERY BYPASS;  Surgeon: Rosetta Posner, MD;  Location: [place];  Service: Vascular;  Laterality: Left;   HERNIA REPAIR Right    INGUINAL HERNIA REPAIR Left 11/03/2013   Procedure: HERNIA REPAIR INGUINAL ADULT;  Surgeon: Gayland Curry, MD;  Location: [place];  Service: General;  Laterality: Left;   PERIPHERAL VASCULAR  INTERVENTION  10/27/2020   Procedure: PERIPHERAL VASCULAR INTERVENTION;  Surgeon: Marty Heck, MD;  Location: [place] [facility];  Service: Cardiovascular;;   SHOULDER SURGERY     RIGHT SHOULDER    VIDEO BRONCHOSCOPY WITH ENDOBRONCHIAL NAVIGATION N/A 07/10/2019   Procedure: VIDEO BRONCHOSCOPY WITH ENDOBRONCHIAL NAVIGATION;  Surgeon: Melrose Nakayama, MD;  Location: [facility] OR;  Service: Thoracic;  Laterality: N/A;    Current Medications: Outpatient Medications Prior to Visit  Medication Sig Dispense Refill   acetaminophen (TYLENOL) 500 MG tablet Take 1,000 mg by mouth every 6 (six) hours as needed for moderate pain.     aspirin EC 81 MG tablet Take 1 tablet (81 mg total) by mouth daily. Swallow whole. 150 tablet 2   atorvastatin (LIPITOR) 40 MG tablet Take 40 mg by mouth at bedtime.     B-D UF III MINI PEN NEEDLES 31G X 5 MM MISC SMARTSIG:1 Each SUB-Q Daily     Bacitracin-Polymyxin B (NEOSPORIN EX) Apply 1 application topically daily as needed (wound care).     cefpodoxime (VANTIN) 100 MG tablet Take 100 mg by mouth 2 (two) times daily.     clopidogrel (PLAVIX) 75 MG tablet Take 1 tablet (75 mg total) by mouth daily. 30 tablet 11   donepezil (ARICEPT) 10 MG tablet Take 10 mg by mouth at bedtime.      finasteride (PROSCAR) 5 MG tablet Take 5 mg by mouth daily.     gabapentin (NEURONTIN) 100 MG capsule Take 100 mg by mouth at bedtime.     LANTUS 100 UNIT/ML injection Inject 0.1 mLs (10 Units total) into the skin at bedtime as needed (High blood glucose). (Patient taking differently: Inject 25 Units into the skin at bedtime.) 10 mL 11   levofloxacin (LEVAQUIN) 500 MG tablet Take 1 tablet (500 mg total) by mouth daily. 8 tablet 0   metFORMIN (GLUCOPHAGE) 500 MG tablet Take by mouth 2 (two) times daily with a meal. Take 1 daily for 7 days then 2 times daily     mirtazapine (REMERON) 15 MG tablet Take 15 mg by mouth at bedtime.     risperiDONE (RISPERDAL) 0.5 MG tablet Take 1 tablet (0.5 mg  total) by mouth 2 (two) times daily. 60 tablet 0   tamsulosin (FLOMAX) 0.4 MG CAPS capsule Take 1 capsule (0.4 mg total) by mouth daily. 30 capsule 1   traMADol (ULTRAM) 50 MG tablet Take 50 mg by mouth every 6 (six) hours as needed for moderate pain.     triamcinolone ointment (KENALOG) 0.1 % Apply 1 application topically daily. Apply to foot     Vitamin D, Ergocalciferol, (DRISDOL) 1.25 MG (50000 UNIT) CAPS capsule Take 50,000 Units by mouth every Friday.     metoprolol tartrate (LOPRESSOR) 25 MG tablet Take 1 tablet (25 mg total) by mouth 2 (two) times daily. 180 tablet 3  nicotine (NICODERM CQ - DOSED IN MG/24 HOURS) 21 mg/24hr patch Place 1 patch (21 mg total) onto the skin daily as needed (Cessation of smoking). (Patient not taking: No sig reported) 28 patch 0   No facility-administered medications prior to visit.     Allergies:   Bee venom, Codeine, Propoxyphene, and Valsartan   Social History   Socioeconomic History   Marital status: Married    Spouse name: [name]   Number of children: Not on file   Years of education: Not on file   Highest education level: Not on file  Occupational History   Not on file  Tobacco Use   Smoking status: Every Day    Packs/day: 0.50    Types: Cigarettes   Smokeless tobacco: Never   Tobacco comments:    burns them up  Vaping Use   Vaping Use: Never used  Substance and Sexual Activity   Alcohol use: No    Alcohol/week: 0.0 standard drinks   Drug use: Yes    Types: Marijuana    Comment: almost daily for pain   Sexual activity: Never  Other Topics Concern   Not on file  Social History Narrative   Not on file   Social Determinants of Health   Financial Resource Strain: Low Risk    Difficulty of Paying Living Expenses: Not hard at all  Food Insecurity: No Food Insecurity   Worried About Charity fundraiser in the Last Year: Never true   Ran Out of Food in the Last Year: Never true  Transportation Needs: No Transportation Needs    Lack of Transportation (Medical): No   Lack of Transportation (Non-Medical): No  Physical Activity: Not on file  Stress: No Stress Concern Present   Feeling of Stress : Only a little  Social Connections: Engineer, building services of Communication with Friends and Family: More than three times a week   Frequency of Social Gatherings with Friends and Family: More than three times a week   Attends Religious Services: 1 to 4 times per year   Active Member of [employer] or Organizations: Yes   Attends Archivist Meetings: 1 to 4 times per year   Marital Status: Married     Family History:  The patient's family history includes Heart disease in his mother.   Review of Systems:    Please see the history of present illness.     All other systems reviewed and are otherwise negative except as noted above.   Physical Exam:    VS:  BP (!) 144/68   Pulse 92   Ht 6\' 1"  (1.854 m)   Wt 118 lb (53.5 kg)   SpO2 100%   BMI 15.57 kg/m    General: Thin elderly male appearing in no acute distress. Head: Normocephalic, atraumatic. Neck: No carotid bruits. JVD not elevated.  Lungs: Respirations regular and unlabored, without wheezes or rales.  Heart: Irregularly irregular. No S3 or S4.  No murmur, no rubs, or gallops appreciated. Abdomen: Appears non-distended. No obvious abdominal masses. Msk:  Strength and tone appear normal for age. No obvious joint deformities or effusions. Extremities: No clubbing or cyanosis. Left AKA. Groin site from recent vascular procedure checked due to patient's wife reporting he had been sore along that area.  He is noted to have a small, firm hematoma but no evidence of a pseudoaneurysm. Neuro: Alert and oriented X 3. Moves all extremities spontaneously. No focal deficits noted. Psych:  Responds to questions appropriately  with a normal affect. Skin: No rashes or lesions noted  Wt Readings from Last 3 Encounters:  11/29/20 118 lb (53.5 kg)  10/27/20 129  lb 13.6 oz (58.9 kg)  10/07/20 130 lb (59 kg)      Studies/Labs Reviewed:   EKG:  EKG is not ordered today.   Recent Labs: 07/21/2020: TSH 0.351; TSH 0.359 09/29/2020: ALT 18; Magnesium 1.8; Platelets 323 10/27/2020: BUN 21; Creatinine, Ser 1.10; Hemoglobin 8.5; Potassium 3.5; Sodium 139   Lipid Panel    Component Value Date/Time   CHOL 114 05/28/2020 0028   TRIG 49 05/28/2020 0028   HDL 41 05/28/2020 0028   CHOLHDL 2.8 05/28/2020 0028   VLDL 10 05/28/2020 0028   LDLCALC 63 05/28/2020 0028    Additional studies/ records that were reviewed today include:   Echocardiogram: 09/2020 IMPRESSIONS     1. Irregular heart rhythm makes accurate assessment of LVEF difficult LV  systolic function is depressed with infeiror hypokinesis. COmpared to echo  from June 2022 the inferior changes appear new. Left ventricular ejection  fraction, by estimation, is  45%%. There is mild left ventricular hypertrophy. Left ventricular  diastolic parameters are indeterminate.   2. Right ventricular systolic function is low normal. The right  ventricular size is normal. There is moderately elevated pulmonary artery  systolic pressure.   3. Right atrial size was mildly dilated.   4. The mitral valve is normal in structure. Mild mitral valve  regurgitation.   5. The aortic valve is tricuspid. Aortic valve regurgitation is not  visualized. Mild to moderate aortic valve sclerosis/calcification is  present, without any evidence of aortic stenosis.   6. The inferior vena cava is dilated in size with <50% respiratory  variability, suggesting right atrial pressure of 15 mmHg.   Assessment:    1. Secondary cardiomyopathy ([place])   2. Persistent atrial fibrillation ([place])   3. Essential (primary) hypertension   4. Peripheral vascular disease ([place])      Plan:   In order of problems listed above:  1. HFmrEF - Recent echocardiogram showed his EF was mildly reduced at 45% but this was in the setting of  sepsis and atrial fibrillation with RVR on admission. Discussed with the patient's wife today and they prefer a conservative approach given his dementia and multiple medical issues which certainly seems reasonable. She mentions he previously stated he does not want to undergo any further testing or procedures but is open to medication adjustments. - Will initially plan to transition Lopressor to Toprol-XL 50mg  daily. At his next visit, can consider adding an ACE-I as he is listed as having delirium with ARB's in the past. Would not plan to add an SGLT2 inhibitor given his malnutrition (BMI 15.5) and poor PO intake at times.   2. Persistent Atrial Fibrillation - This was diagnosed in 07/2020 and he converted to NSR with IV Amiodarone but he did have recurrence in 09/2020. He remains in atrial fibrillation by examination today but HR is well-controlled. His HR has been normal when checked at home and he has not complained of any palpitations. Would anticipate a rate-control strategy for now as he is no longer on anticoagulation which limits the use of antiarrhythmic therapy. Will transition Lopressor to Toprol-XL as outlined above.  - Risks and benefits of anticoagulation previously reviewed with the patient's family and it was decided to stop this at his last visit. They are aware of the increased thromboembolic risk.   3. HTN - His BP is  at 144/68 during today's visit. Will switch Lopressor to Toprol-XL and I have encouraged them to follow readings at home.   4. PAD - He is s/p L AKA and recently underwent lower extremity angiography by Dr. Carlis Abbott on 10/27/2020 with right SFA and above-knee popliteal angioplasty performed. He remains on ASA 81mg  daily, Plavix 75mg  daily and Atorvastatin 40mg  daily. He does continue to smoke with no plans to discontinue at this time.     Medication Adjustments/Labs and Tests Ordered: Current medicines are reviewed at length with the patient today.  Concerns regarding  medicines are outlined above.  Medication changes, Labs and Tests ordered today are listed in the Patient Instructions below. Patient Instructions  Medication Instructions:   STOP Lopressor   START Toprol XL 50 mg daily     Labwork: None today   Testing/Procedures: None today   Follow-Up:  3 months with Dr.McDowell or [place], PA-C   Any Other Special Instructions Will Be Listed Below (If Applicable).  If you need a refill on your cardiac medications before your next appointment, please call your pharmacy.   Signed, Erma Heritage, PA-C  11/29/2020 7:24 PM    [facility] [address] [place], [place] [address] Phone: [phone] Fax: [phone]

## 2020-11-29 ENCOUNTER — Encounter: Payer: Self-pay | Admitting: Student

## 2020-11-29 ENCOUNTER — Ambulatory Visit (INDEPENDENT_AMBULATORY_CARE_PROVIDER_SITE_OTHER): Payer: Medicare Other | Admitting: Student

## 2020-11-29 ENCOUNTER — Other Ambulatory Visit: Payer: Self-pay

## 2020-11-29 VITALS — BP 144/68 | HR 92 | Ht 73.0 in | Wt 118.0 lb

## 2020-11-29 DIAGNOSIS — I10 Essential (primary) hypertension: Secondary | ICD-10-CM

## 2020-11-29 DIAGNOSIS — I739 Peripheral vascular disease, unspecified: Secondary | ICD-10-CM | POA: Diagnosis not present

## 2020-11-29 DIAGNOSIS — I429 Cardiomyopathy, unspecified: Secondary | ICD-10-CM

## 2020-11-29 DIAGNOSIS — I4819 Other persistent atrial fibrillation: Secondary | ICD-10-CM | POA: Diagnosis not present

## 2020-11-29 MED ORDER — METOPROLOL SUCCINATE ER 50 MG PO TB24: 50.0000 mg | ORAL_TABLET | Freq: Every day | ORAL | 3 refills | Status: DC

## 2020-11-29 NOTE — Patient Instructions (Signed)
Medication Instructions:   STOP Lopressor   START Toprol XL 50 mg daily     Labwork: None today   Testing/Procedures: None today   Follow-Up:  3 months with Dr.McDowell or [place], PA-C   Any Other Special Instructions Will Be Listed Below (If Applicable).  If you need a refill on your cardiac medications before your next appointment, please call your pharmacy.

## 2020-12-06 ENCOUNTER — Other Ambulatory Visit: Payer: Self-pay

## 2020-12-06 ENCOUNTER — Encounter (HOSPITAL_COMMUNITY): Payer: TRICARE For Life (TFL)

## 2020-12-06 ENCOUNTER — Ambulatory Visit (INDEPENDENT_AMBULATORY_CARE_PROVIDER_SITE_OTHER): Payer: Medicare Other | Admitting: Physician Assistant

## 2020-12-06 VITALS — BP 100/60 | HR 87 | Temp 98.3°F | Resp 18 | Ht 73.0 in | Wt 118.0 lb

## 2020-12-06 DIAGNOSIS — Z89612 Acquired absence of left leg above knee: Secondary | ICD-10-CM

## 2020-12-06 DIAGNOSIS — I739 Peripheral vascular disease, unspecified: Secondary | ICD-10-CM

## 2020-12-06 NOTE — Progress Notes (Signed)
Office Note     CC:  follow up Requesting Provider:  [name], MD  HPI: Allen Yu is a 75 y.o. (Jul 19, 1945) male who presents for follow up of peripheral artery disease. On 10/27/2020 he underwent aortogram with right peroneal and TP trunk angioplasty, right SFA and AK popliteal angioplasty by Dr. Carlis Abbott.  This was performed after he presented with rest pain in right foot. At the time of his initial follow up on 11/02/20 he was still having pain in right foot and blistering on dorsum of foot and toes, however the appearance of the foot was improved. His studies showed patency of the intervened areas as well as brisk doppler signals in right foot. Despite him having some residual pain his foot looked well perfused and so he was instructed to follow up in 1 month.  He presents today with his wife for his follow up visit. He explains that he continues to have intermittent shooting pains down his right leg. At times it can be an 8/10. He says this is not changed since prior to procedure. He also says he cannot wiggle his right toes. This is also unchanged. His prior blisters on his right foot have healed. He sees podiatrist at the [place] and has follow up in with them in early December. He has no new wounds. Right foot remains warm. He does not ambulate and sits in wheelchair all day. He has not had much swelling in his right leg. 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    Other AC:  Plavix The pt is on BB for hypertension.  The pt does have diabetes. Tobacco hx:  current    Past Medical History:  Diagnosis Date   Anemia    Arthritis    Carotid stenosis    Chronic back pain    Chronic kidney disease    Constipation    Dementia (HCC)    Diabetes mellitus    GERD (gastroesophageal reflux disease)    History of kidney stones    Hypertension    Lung nodule    PAF (paroxysmal  atrial fibrillation) (HCC)    Peripheral neuropathy    Peripheral vascular disease (HCC)    PTSD (post-traumatic stress disorder)    Schizophrenia ([place])    Tuberculosis    Treated    Past Surgical History:  Procedure Laterality Date   ABDOMINAL AORTOGRAM W/LOWER EXTREMITY N/A 05/20/2020   Procedure: ABDOMINAL AORTOGRAM W/LOWER EXTREMITY;  Surgeon: Elam Dutch, MD;  Location: [facility];  Service: Cardiovascular;  Laterality: N/A;   ABDOMINAL AORTOGRAM W/LOWER EXTREMITY N/A 10/27/2020   Procedure: ABDOMINAL AORTOGRAM W/LOWER EXTREMITY;  Surgeon: Marty Heck, MD;  Location: [facility];  Service: Cardiovascular;  Laterality: N/A;   AMPUTATION Left 06/28/2020   Procedure: AMPUTATION ABOVE KNEE LEFT;  Surgeon: Rosetta Posner, MD;  Location: [facility];  Service: Vascular;  Laterality: Left;   [place], 2013   x2   BLADDER SURGERY     02   CHOLECYSTECTOMY     COLONOSCOPY     COLONOSCOPY N/A 12/07/2014   Procedure: COLONOSCOPY;  Surgeon: Daneil Dolin, MD;  [facility];  Service: Endoscopy;  Laterality: N/A;  10:30 Am   EYE SURGERY Bilateral    removed metal from eye  FEMORAL-TIBIAL BYPASS GRAFT Left 05/27/2020   Procedure: LEFT FEMORAL TO PERONEAL ARTERY BYPASS;  Surgeon: Rosetta Posner, MD;  Location: [facility];  Service: Vascular;  Laterality: Left;   HERNIA REPAIR Right    INGUINAL HERNIA REPAIR Left 11/03/2013   Procedure: HERNIA REPAIR INGUINAL ADULT;  Surgeon: Gayland Curry, MD;  Location: [facility];  Service: General;  Laterality: Left;   PERIPHERAL VASCULAR INTERVENTION  10/27/2020   Procedure: PERIPHERAL VASCULAR INTERVENTION;  Surgeon: Marty Heck, MD;  Location: [facility];  Service: Cardiovascular;;   SHOULDER SURGERY     RIGHT SHOULDER    VIDEO BRONCHOSCOPY WITH ENDOBRONCHIAL NAVIGATION N/A 07/10/2019   Procedure: VIDEO BRONCHOSCOPY WITH ENDOBRONCHIAL NAVIGATION;  Surgeon: Melrose Nakayama, MD;   Location: [place];  Service: Thoracic;  Laterality: N/A;    Social History   Socioeconomic History   Marital status: Married    Spouse name: [name]   Number of children: Not on file   Years of education: Not on file   Highest education level: Not on file  Occupational History   Not on file  Tobacco Use   Smoking status: Every Day    Packs/day: 0.50    Types: Cigarettes   Smokeless tobacco: Never   Tobacco comments:    burns them up  Vaping Use   Vaping Use: Never used  Substance and Sexual Activity   Alcohol use: No    Alcohol/week: 0.0 standard drinks   Drug use: Yes    Types: Marijuana    Comment: almost daily for pain   Sexual activity: Never  Other Topics Concern   Not on file  Social History Narrative   Not on file   Social Determinants of Health   Financial Resource Strain: Low Risk    Difficulty of Paying Living Expenses: Not hard at all  Food Insecurity: No Food Insecurity   Worried About Charity fundraiser in the Last Year: Never true   Ran Out of Food in the Last Year: Never true  Transportation Needs: No Transportation Needs   Lack of Transportation (Medical): No   Lack of Transportation (Non-Medical): No  Physical Activity: Not on file  Stress: No Stress Concern Present   Feeling of Stress : Only a little  Social Connections: Engineer, building services of Communication with Friends and Family: More than three times a week   Frequency of Social Gatherings with Friends and Family: More than three times a week   Attends Religious Services: 1 to 4 times per year   Active Member of [employer] or Organizations: Yes   Attends Archivist Meetings: 1 to 4 times per year   Marital Status: Married  Human resources officer Violence: Not At Risk   Fear of Current or Ex-Partner: No   Emotionally Abused: No   Physically Abused: No   Sexually Abused: No    Family History  Problem Relation Age of Onset   Heart disease Mother        before age 2     Current Outpatient Medications  Medication Sig Dispense Refill   acetaminophen (TYLENOL) 500 MG tablet Take 1,000 mg by mouth every 6 (six) hours as needed for moderate pain.     aspirin EC 81 MG tablet Take 1 tablet (81 mg total) by mouth daily. Swallow whole. 150 tablet 2   atorvastatin (LIPITOR) 40 MG tablet Take 40 mg by mouth at bedtime.     B-D UF III MINI PEN NEEDLES  31G X 5 MM MISC SMARTSIG:1 Each SUB-Q Daily     Bacitracin-Polymyxin B (NEOSPORIN EX) Apply 1 application topically daily as needed (wound care).     cefpodoxime (VANTIN) 100 MG tablet Take 100 mg by mouth 2 (two) times daily.     clopidogrel (PLAVIX) 75 MG tablet Take 1 tablet (75 mg total) by mouth daily. 30 tablet 11   donepezil (ARICEPT) 10 MG tablet Take 10 mg by mouth at bedtime.      finasteride (PROSCAR) 5 MG tablet Take 5 mg by mouth daily.     gabapentin (NEURONTIN) 100 MG capsule Take 100 mg by mouth at bedtime.     LANTUS 100 UNIT/ML injection Inject 0.1 mLs (10 Units total) into the skin at bedtime as needed (High blood glucose). (Patient taking differently: Inject 25 Units into the skin at bedtime.) 10 mL 11   levofloxacin (LEVAQUIN) 500 MG tablet Take 1 tablet (500 mg total) by mouth daily. 8 tablet 0   metFORMIN (GLUCOPHAGE) 500 MG tablet Take by mouth 2 (two) times daily with a meal. Take 1 daily for 7 days then 2 times daily     metoprolol succinate (TOPROL-XL) 50 MG 24 hr tablet Take 1 tablet (50 mg total) by mouth daily. Take with or immediately following a meal. 90 tablet 3   mirtazapine (REMERON) 15 MG tablet Take 15 mg by mouth at bedtime.     risperiDONE (RISPERDAL) 0.5 MG tablet Take 1 tablet (0.5 mg total) by mouth 2 (two) times daily. 60 tablet 0   tamsulosin (FLOMAX) 0.4 MG CAPS capsule Take 1 capsule (0.4 mg total) by mouth daily. 30 capsule 1   traMADol (ULTRAM) 50 MG tablet Take 50 mg by mouth every 6 (six) hours as needed for moderate pain.     triamcinolone ointment (KENALOG) 0.1 % Apply  1 application topically daily. Apply to foot     Vitamin D, Ergocalciferol, (DRISDOL) 1.25 MG (50000 UNIT) CAPS capsule Take 50,000 Units by mouth every Friday.     nicotine (NICODERM CQ - DOSED IN MG/24 HOURS) 21 mg/24hr patch Place 1 patch (21 mg total) onto the skin daily as needed (Cessation of smoking). (Patient not taking: No sig reported) 28 patch 0   No current facility-administered medications for this visit.    Allergies  Allergen Reactions   Bee Venom Anaphylaxis   Codeine Other (See Comments)    incoherent  Other reaction(s): Delirium   Propoxyphene Other (See Comments)    Dizziness, "Makes me feel drunk" Other reaction(s): Dizziness   Valsartan Other (See Comments)    incoherent Other reaction(s): Delirium     REVIEW OF SYSTEMS:   [X]  denotes positive finding, [ ]  denotes negative finding Cardiac  Comments:  Chest pain or chest pressure:    Shortness of breath upon exertion:    Short of breath when lying flat:    Irregular heart rhythm:        Vascular    Pain in calf, thigh, or hip brought on by ambulation:    Pain in feet at night that wakes you up from your sleep:     Blood clot in your veins:    Leg swelling:         Pulmonary    Oxygen at home:    Productive cough:     Wheezing:         Neurologic    Sudden weakness in arms or legs:     Sudden numbness in arms or  legs:     Sudden onset of difficulty speaking or slurred speech:    Temporary loss of vision in one eye:     Problems with dizziness:         Gastrointestinal    Blood in stool:     Vomited blood:         Genitourinary    Burning when urinating:     Blood in urine:        Psychiatric    Major depression:         Hematologic    Bleeding problems:    Problems with blood clotting too easily:        Skin    Rashes or ulcers:        Constitutional    Fever or chills:      PHYSICAL EXAMINATION:  Vitals:   12/06/20 1217  BP: 100/60  Pulse: 87  Resp: 18  Temp: 98.3 F  (36.8 C)  TempSrc: Temporal  SpO2: 100%  Weight: 118 lb (53.5 kg)  Height: 6\' 1"  (1.854 m)    General:  WDWN in NAD; vital signs documented above Gait: Not observed, in wheelchair HENT: WNL, normocephalic Pulmonary: normal non-labored breathing Cardiac: irregular HR, Abdomen: soft, non distended Vascular Exam/Pulses:  Right Left  Radial 2+ (normal) 2+ (normal)  Femoral 2+ (normal) 2+ (normal)  Popliteal absent AKA  DP absent AKA  PT absent AKA  Doppler right PT/ DP/ Pero. Right foot is warm and well perfused Extremities: without ischemic changes, without Gangrene , without cellulitis; without open wounds;  Musculoskeletal: no muscle wasting or atrophy  Neurologic: A&O X 3;  No focal weakness or paresthesias are detected Psychiatric:  The pt has Normal affect.  ASSESSMENT/PLAN:: 75 y.o. male here for follow up for peripheral artery disease. On 10/27/2020 he underwent aortogram with right peroneal and TP trunk angioplasty, right SFA and AK popliteal angioplasty by Dr. Carlis Abbott.  This was performed after he presented with rest pain in right foot.  Continues to have pain in right foot that is unchanged as well as decreased motor. This has been present prior to intervention. His right foot is warm and has brisk doppler signals. His blisters have healed and he has no new tissue loss. There is no indication for further intervention at this time.  - Continue Aspirin, statin, plavix - discussed frequent foot checks and keeping right foot protected - they will follow up sooner if any new symptoms or concerns - He will follow up in 3 months with ABI and RLE arterial duplex  Karoline Caldwell, PA-C [facility] [phone]  Clinic MD:   Roxanne Mins

## 2020-12-08 ENCOUNTER — Other Ambulatory Visit: Payer: Self-pay

## 2020-12-08 DIAGNOSIS — I739 Peripheral vascular disease, unspecified: Secondary | ICD-10-CM

## 2020-12-19 ENCOUNTER — Telehealth: Payer: Self-pay

## 2020-12-19 NOTE — Telephone Encounter (Signed)
Received call from patient's spouse asking about referral for left AKA prosthesis. Spoke with PA, will refer to PT for evaluation of prosthetic need/ability to return to previous level of functioning. Patient is currently non-ambulatory, but the wounds on his right lower extremity have healed and she thinks he may be able to walk again. Once PT evaluation is done - will send in [facility] script if needed.

## 2020-12-20 ENCOUNTER — Ambulatory Visit (HOSPITAL_COMMUNITY): Payer: Medicare Other

## 2020-12-21 ENCOUNTER — Other Ambulatory Visit: Payer: Self-pay

## 2020-12-21 ENCOUNTER — Other Ambulatory Visit: Payer: Self-pay | Admitting: *Deleted

## 2020-12-21 DIAGNOSIS — Z461 Encounter for fitting and adjustment of hearing aid: Secondary | ICD-10-CM | POA: Insufficient documentation

## 2020-12-21 NOTE — Patient Outreach (Addendum)
[facility] ([facility] Care Management Telephonic RN Care Manager Note   12/21/2020 Name:  Allen Yu MRN:  [id number] DOB:  08/29/1945  Summary: Outreach attempt to follow up with patient and wife Wife unable to speak at this time as staff are present to install a chair lift in the home for the patient at the time of the call Wife agree to outreach to RN CM at a later time   Subjective: Allen Yu is an 75 y.o. year old male who is a primary patient of Fagan, Roy, MD. The care management team was consulted for assistance with care management and/or care coordination needs.    Telephonic RN Care Manager completed Telephone Visit today.   Objective:  Medications Reviewed Today     Reviewed by Baglia, Corrina, PA-C (Physician Assistant Certified) on 12/06/20 at 1250  Med List Status: <None>   Medication Order Taking? Sig Documenting Provider Last Dose Status Informant  acetaminophen (TYLENOL) 500 MG tablet [id number] Yes Take 1,000 mg by mouth every 6 (six) hours as needed for moderate pain. [provider] Taking Active Spouse/Significant Other  aspirin EC 81 MG tablet [id number] Yes Take 1 tablet (81 mg total) by mouth daily. Swallow whole. Clark, Christopher J, MD Taking Active   atorvastatin (LIPITOR) 40 MG tablet [id number] Yes Take 40 mg by mouth at bedtime. [provider] Taking Active Spouse/Significant Other  B-D UF III MINI PEN NEEDLES 31G X 5 MM MISC [id number] Yes SMARTSIG:1 Each SUB-Q Daily [provider] Taking Active Spouse/Significant Other  Bacitracin-Polymyxin B (NEOSPORIN EX) [id number] Yes Apply 1 application topically daily as needed (wound care). [provider] Taking Active Spouse/Significant Other  cefpodoxime (VANTIN) 100 MG tablet [id number] Yes Take 100 mg by mouth 2 (two) times daily. [provider] Taking Active   clopidogrel (PLAVIX) 75 MG tablet [id number] Yes Take 1 tablet (75 mg total) by mouth  daily. Clark, Christopher J, MD Taking Active   donepezil (ARICEPT) 10 MG tablet [id number] Yes Take 10 mg by mouth at bedtime.  [provider] Taking Active Spouse/Significant Other  finasteride (PROSCAR) 5 MG tablet [id number] Yes Take 5 mg by mouth daily. [provider] Taking Active Spouse/Significant Other  gabapentin (NEURONTIN) 100 MG capsule [id number] Yes Take 100 mg by mouth at bedtime. [provider] Taking Active Spouse/Significant Other  LANTUS 100 UNIT/ML injection [id number] Yes Inject 0.1 mLs (10 Units total) into the skin at bedtime as needed (High blood glucose).  Patient taking differently: Inject 25 Units into the skin at bedtime.   Gonfa, Taye T, MD Taking Active            Med Note (SCRONCE, TRINA W   Thu Oct 27, 2020  9:07 AM) Took 12.5 this  am   levofloxacin (LEVAQUIN) 500 MG tablet [id number] Yes Take 1 tablet (500 mg total) by mouth daily. Tat, David, MD Taking Active Spouse/Significant Other  metFORMIN (GLUCOPHAGE) 500 MG tablet [id number] Yes Take by mouth 2 (two) times daily with a meal. Take 1 daily for 7 days then 2 times daily [provider] Taking Active   metoprolol succinate (TOPROL-XL) 50 MG 24 hr tablet [id number] Yes Take 1 tablet (50 mg total) by mouth daily. Take with or immediately following a meal. Strader, Brittany M, PA-C Taking Active   mirtazapine (REMERON) 15 MG tablet [id number] Yes Take 15 mg by mouth at bedtime. [provider] Taking Active Spouse/Significant Other  nicotine (NICODERM CQ - DOSED IN MG/24 HOURS) 21   mg/24hr patch [id number]  Place 1 patch (21 mg total) onto the skin daily as needed (Cessation of smoking).  Patient not taking: No sig reported   Mercy Riding, MD  Active Spouse/Significant Other           Med Note Scotty Court   Thu Jul 21, 2020 11:20 AM) Per wife, pt needs  risperiDONE (RISPERDAL) 0.5 MG tablet [id number] Yes Take 1 tablet (0.5 mg total) by mouth 2 (two) times daily. Orson Eva, MD Taking Active Spouse/Significant Other           Med Note Wilmon Pali, MELISSA R   Wed Oct 19, 2020 11:41 AM) Pt currently has 1 mg tabs, taking 0.5 tabs daily  tamsulosin (FLOMAX) 0.4 MG CAPS capsule [id number] Yes Take 1 capsule (0.4 mg total) by mouth daily. Orson Eva, MD Taking Active Spouse/Significant Other  traMADol (ULTRAM) 50 MG tablet [id number] Yes Take 50 mg by mouth every 6 (six) hours as needed for moderate pain. [provider] Taking Active Spouse/Significant Other  triamcinolone ointment (KENALOG) 0.1 % [id number] Yes Apply 1 application topically daily. Apply to foot [provider] Taking Active Spouse/Significant Other  Vitamin D, Ergocalciferol, (DRISDOL) 1.25 MG (50000 UNIT) CAPS capsule [id number] Yes Take 50,000 Units by mouth every Friday. [provider] Taking Active Spouse/Significant Other  Med List Note Jolayne Haines, CPhT 07/09/16 1572): Dr. [name] [phone]             SDOH:  (Social Determinants of Health) assessments and interventions performed:    Care Plan  Review of patient past medical history, allergies, medications, health status, including review of consultants reports, laboratory and other test data, was performed as part of comprehensive evaluation for care management services.     Care Plan : RN Care Manager Plan of Care  Updates made by Barbaraann Faster, RN since 12/21/2020 12:00 AM     Problem: Complex Care Coordination Needs and disease management in patient with DM II, CKD 3, Afib, PTSD   Priority: High     Long-Range Goal: Establish Plan of Care for Management Complex SDOH Barriers, disease management and Care Coordination Needs in patient with DM II, CKD 3, Afib, PTSD   Start Date: 12/21/2020  This Visit's Progress: On track  Priority: High  Note:   Current Barriers:  Knowledge Deficits related to plan of care for management of Atrial Fibrillation, DM II, CKD Stage 3, and PTSD/dementia   Care Coordination needs related to Lacks knowledge of community resource: VA benefits  RN CM Clinical Goal(s):  Patient will work with [employer] administration ([facility]) benefit providers to continue home management of DM II, Afib, CKD, PTSD/dementia as evidenced by decrease in admissions and improved lab values, behavior  through collaboration with Consulting civil engineer, provider, and care team.   Interventions: Follow up outreaches to wife and patient as allowed to further assess and assist with resources/guidance, care coordination and/or disease management/education  Inter-disciplinary care team collaboration (see longitudinal plan of care) Evaluation of current treatment plan related to  self management and patient's adherence to plan as established by provider   AFIB Interventions: (Status:  Goal on track:  Yes.) Long Term Goal   Reviewed importance of adherence to anticoagulant exactly as prescribed    Chronic Kidney Disease Interventions:  (Status:  Goal on track:  Yes.) Long Term Goal Evaluation of current treatment plan related to chronic kidney disease self management and patient's adherence to plan as established by provider  Last practice recorded BP readings:  BP Readings from Last 3 Encounters:  12/06/20 100/60  11/29/20 (!) 144/68  11/02/20 (!) 144/86  Most recent eGFR/CrCl: No results found for: EGFR  No components found for: CRCL    Diabetes Interventions:  (Status:  Goal on track:  Yes.) Long Term Goal Assessed patient's understanding of A1c goal: <7% Discussed plans with patient for ongoing care management follow up and provided patient with direct contact information for care management team Review of patient status, including review of consultants reports, relevant laboratory and other test results, and medications completed Lab Results  Component Value Date   HGBA1C 9.7 (H) 09/24/2020   Dementia:  (Status:  Condition stable.  Not addressed this visit.)  Long Term  Goal Evaluation of current treatment plan related to misuse of: Dementia with other behavioral disturbances Emotional Support Provided to patient/caregiver  Patient Goals/Self-Care Activities: Take all medications as prescribed Attend all scheduled provider appointments  Follow Up Plan:  The patient has been provided with contact information for the care management team and has been advised to call with any health related questions or concerns.  The care management team will reach out to the patient again over the next 30 business days.       Plan: The patient has been provided with contact information for the care management team and has been advised to call with any health related questions or concerns.  The care management team will reach out to the patient again over the next 30 business days.   L. , RN, BSN, CCM [facility] Telephonic Care Management Care Coordinator Office number [phone] [facility] number [phone] Fax number [phone]      

## 2020-12-26 DIAGNOSIS — E1129 Type 2 diabetes mellitus with other diabetic kidney complication: Secondary | ICD-10-CM | POA: Diagnosis not present

## 2020-12-26 DIAGNOSIS — E1042 Type 1 diabetes mellitus with diabetic polyneuropathy: Secondary | ICD-10-CM | POA: Diagnosis not present

## 2020-12-26 DIAGNOSIS — D509 Iron deficiency anemia, unspecified: Secondary | ICD-10-CM | POA: Diagnosis not present

## 2020-12-26 DIAGNOSIS — Z79899 Other long term (current) drug therapy: Secondary | ICD-10-CM | POA: Diagnosis not present

## 2020-12-26 DIAGNOSIS — E440 Moderate protein-calorie malnutrition: Secondary | ICD-10-CM | POA: Diagnosis not present

## 2020-12-26 DIAGNOSIS — I739 Peripheral vascular disease, unspecified: Secondary | ICD-10-CM | POA: Diagnosis not present

## 2020-12-28 ENCOUNTER — Other Ambulatory Visit: Payer: Self-pay

## 2020-12-28 ENCOUNTER — Other Ambulatory Visit: Payer: Self-pay | Admitting: *Deleted

## 2020-12-28 DIAGNOSIS — E119 Type 2 diabetes mellitus without complications: Secondary | ICD-10-CM | POA: Diagnosis not present

## 2020-12-28 DIAGNOSIS — Z794 Long term (current) use of insulin: Secondary | ICD-10-CM | POA: Diagnosis not present

## 2020-12-28 DIAGNOSIS — H524 Presbyopia: Secondary | ICD-10-CM | POA: Diagnosis not present

## 2020-12-28 DIAGNOSIS — H2513 Age-related nuclear cataract, bilateral: Secondary | ICD-10-CM | POA: Diagnosis not present

## 2020-12-28 DIAGNOSIS — Z7984 Long term (current) use of oral hypoglycemic drugs: Secondary | ICD-10-CM | POA: Diagnosis not present

## 2020-12-28 NOTE — Patient Outreach (Addendum)
[facility]) Care Management Telephonic RN Care Manager Note   12/28/2020 Name:  Allen Yu MRN:  [id number] DOB:  12/17/1945  Summary: Follow up outreach to assess for needs Wife confirms patient has just left ophthalmology office, is scheduled to be [facility] on 12/29/20 for his podiatry appointment but voices interest in having someone to come in to change his catheter and to get an [facility] outpatient rehabilitation referral  His wife had to disconnect to begin transporting patient to the home and request to be able to return a call to RN CM   Recommendations/Changes made from today's visit: pending   Subjective: Allen Yu is an 75 y.o. year old male who is a primary patient of [name], MD. The care management team was consulted for assistance with care management and/or care coordination needs.    Telephonic RN Care Manager completed Telephone Visit today.   Objective:  Medications Reviewed Today     Reviewed by Karoline Caldwell, PA-C (Physician Assistant Certified) on 61/44/31 at 1250  Med List Status: <None>   Medication Order Taking? Sig Documenting Provider Last Dose Status Informant  acetaminophen (TYLENOL) 500 MG tablet [id number] Yes Take 1,000 mg by mouth every 6 (six) hours as needed for moderate pain. [provider] Taking Active Spouse/Significant Other  aspirin EC 81 MG tablet [id number] Yes Take 1 tablet (81 mg total) by mouth daily. Swallow whole. Marty Heck, MD Taking Active   atorvastatin (LIPITOR) 40 MG tablet [id number] Yes Take 40 mg by mouth at bedtime. [provider] Taking Active Spouse/Significant Other  B-D UF III MINI PEN NEEDLES 31G X 5 MM MISC [id number] Yes SMARTSIG:1 Each SUB-Q Daily [provider] Taking Active Spouse/Significant Other  Bacitracin-Polymyxin B (NEOSPORIN EX) [id number] Yes Apply 1 application topically daily as needed (wound care). [provider] Taking Active Spouse/Significant Other  cefpodoxime (VANTIN) 100 MG tablet [id number] Yes Take 100 mg by mouth 2 (two) times daily. [provider] Taking Active   clopidogrel (PLAVIX) 75 MG tablet [id number] Yes Take 1 tablet (75 mg total) by mouth daily. Marty Heck, MD Taking Active   donepezil (ARICEPT) 10 MG tablet [id number] Yes Take 10 mg by mouth at bedtime.  [provider] Taking Active Spouse/Significant Other  finasteride (PROSCAR) 5 MG tablet [id number] Yes Take 5 mg by mouth daily. [provider] Taking Active Spouse/Significant Other  gabapentin (NEURONTIN) 100 MG capsule [id number] Yes Take 100 mg by mouth at bedtime. [provider] Taking Active Spouse/Significant Other  LANTUS 100 UNIT/ML injection [id number] Yes Inject 0.1 mLs (10 Units total) into the skin at bedtime as needed (High blood glucose).  Patient taking differently: Inject 25 Units into the skin at bedtime.   Mercy Riding, MD Taking Active            Med Note Maxwell Caul Oct 27, 2020  9:07 AM) Took 12.5 this  am   levofloxacin (LEVAQUIN) 500 MG tablet [id number] Yes Take 1 tablet (500 mg total) by mouth daily. Orson Eva, MD Taking Active Spouse/Significant Other  metFORMIN (GLUCOPHAGE) 500 MG tablet [id number] Yes Take by mouth 2 (two) times daily with a meal. Take 1 daily for 7 days then 2 times daily [provider] Taking Active   metoprolol succinate (TOPROL-XL) 50 MG 24 hr tablet [id number] Yes Take 1 tablet (50 mg total) by mouth daily. Take with or immediately following a meal. Ahmed Prima, [place]  M, PA-C Taking Active   mirtazapine (REMERON) 15 MG tablet [id number] Yes Take 15 mg by mouth at bedtime. [provider] Taking Active Spouse/Significant Other  nicotine (NICODERM CQ - DOSED IN MG/24 HOURS) 21 mg/24hr patch [id number]  Place 1 patch (21 mg total) onto the skin daily as needed (Cessation of smoking).  Patient not taking: No  sig reported   Mercy Riding, MD  Active Spouse/Significant Other           Med Note Scotty Court   Thu Jul 21, 2020 11:20 AM) Per wife, pt needs  risperiDONE (RISPERDAL) 0.5 MG tablet [id number] Yes Take 1 tablet (0.5 mg total) by mouth 2 (two) times daily. Orson Eva, MD Taking Active Spouse/Significant Other           Med Note Wilmon Pali, MELISSA R   Wed Oct 19, 2020 11:41 AM) Pt currently has 1 mg tabs, taking 0.5 tabs daily  tamsulosin (FLOMAX) 0.4 MG CAPS capsule [id number] Yes Take 1 capsule (0.4 mg total) by mouth daily. Orson Eva, MD Taking Active Spouse/Significant Other  traMADol (ULTRAM) 50 MG tablet [id number] Yes Take 50 mg by mouth every 6 (six) hours as needed for moderate pain. [provider] Taking Active Spouse/Significant Other  triamcinolone ointment (KENALOG) 0.1 % [id number] Yes Apply 1 application topically daily. Apply to foot [provider] Taking Active Spouse/Significant Other  Vitamin D, Ergocalciferol, (DRISDOL) 1.25 MG (50000 UNIT) CAPS capsule [id number] Yes Take 50,000 Units by mouth every Friday. [provider] Taking Active Spouse/Significant Other  Med List Note Jolayne Haines, CPhT 07/09/16 0920): Dr. [name] [phone]            Patient Active Problem List   Diagnosis Date Noted   Encounter for fitting and adjustment of hearing aid 12/21/2020   Other specified problems related to psychosocial circumstances 11/21/2020   Sensorineural hearing loss, bilateral 11/21/2020   Tobacco dependence 10/06/2020   Benign prostatic hyperplasia with urinary obstruction 10/06/2020   Above knee amputation of left lower extremity ([place]) 10/06/2020   Other specified counseling 10/06/2020   Severe sepsis ([place]) 09/24/2020   Schizophrenia, unspecified ([place]) 09/24/2020   Unspecified dementia, unspecified severity, without behavioral disturbance, psychotic disturbance, mood disturbance, and anxiety ([place]) 09/24/2020   Lung nodule  09/24/2020   Indwelling Foley catheter present 09/24/2020   Peripheral vascular disease ([place]) 09/24/2020   Mass of testicle 09/24/2020   Problem related to unspecified psychosocial circumstances 08/01/2020   Hypermetropia 07/22/2020   Unspecified atrial fibrillation ([place]) 07/22/2020   Klebsiella sepsis ([place]) 07/22/2020   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   SIRS (systemic inflammatory response syndrome) ([place]) 94/50/3888   Acute metabolic encephalopathy 28/00/3491   Hyponatremia    S/P AKA (above knee amputation) unilateral, left ([place]) 06/28/2020   Gangrene of left foot ([place]) 06/28/2020   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 ([place]) 05/27/2020   Critical lower limb ischemia ([place]) 05/21/2020   Malnutrition ([place]) 05/19/2020   Plantar callus 12/24/2019   Cavitary pneumonia 09/14/2019   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   Unintentional weight loss 09/14/2019   Coronary artery calcification seen on CT scan 10/28/2018   Hilar adenopathy 04/10/2016   Lung nodule seen on imaging study 07/09/2015   Acute encephalopathy    Hypoglycemia due to insulin    Type 2 diabetes mellitus with other circulatory complications ([place]) 79/15/0569   Acute kidney injury superimposed on chronic kidney disease ([place]) 07/08/2015   Tobacco use disorder 07/08/2015   Diverticulosis of  colon without hemorrhage    Left inguinal hernia 09/16/2013   Altered mental status 11/17/2012   Hypothermia 11/17/2012   Leukocytosis 11/17/2012   Chronic kidney disease, stage 3 unspecified (HCC)    Post-traumatic stress disorder, chronic    GERD (gastroesophageal reflux disease)    Essential (primary) hypertension 09/10/2011   Anemia 09/10/2011   Diabetic neuropathy ([place]) 09/10/2011   DDD (degenerative disc disease), lumbar 09/10/2011   Psychosis ([place]) 09/10/2011   Diabetes mellitus type 2, uncontrolled 09/10/2011   Hyperlipemia 09/10/2011   Microalbuminuria 09/10/2011     Plan: The patient has  been provided with contact information for the care management team and has been advised to call with any health related questions or concerns.  The care management team will reach out to the patient again over the next 30 business days.  Keishawna Carranza L. [name], RN, BSN, [place] Coordinator Office number [phone] Main [facility] number [phone] Fax number [phone]

## 2020-12-30 ENCOUNTER — Other Ambulatory Visit: Payer: Self-pay | Admitting: *Deleted

## 2020-12-30 NOTE — Patient Outreach (Addendum)
[facility]) Care Management Telephonic RN Care Manager Note   12/30/2020 Name:  Allen Yu MRN:  [id number] DOB:  Mar 31, 1945  Summary: [facility] Unsuccessful outreach  Wife left a voice message 12/28/20 She mentioned interest in patient receiving assistance at home to have his catheter monthly and orders for outpatient rehabilitation at [facility] attempt to the listed at the preferred outreach number in [facility]  No answer. [facility] RN CM left HIPAA [facility] Portability and Accountability Act) compliant voicemail message along with CM's contact info.   Updated her of next outreach scheduled    Subjective: Allen Yu is an 75 y.o. year old male who is a primary patient of [name], MD. The care management team was consulted for assistance with care management and/or care coordination needs.    Telephonic RN Care Manager completed Telephone Visit today.   Objective:  Medications Reviewed Today     Reviewed by Karoline Caldwell, PA-C (Physician Assistant Certified) on 17/51/02 at 1250  Med List Status: <None>   Medication Order Taking? Sig Documenting Provider Last Dose Status Informant  acetaminophen (TYLENOL) 500 MG tablet [id number] Yes Take 1,000 mg by mouth every 6 (six) hours as needed for moderate pain. [provider] Taking Active Spouse/Significant Other  aspirin EC 81 MG tablet [id number] Yes Take 1 tablet (81 mg total) by mouth daily. Swallow whole. Marty Heck, MD Taking Active   atorvastatin (LIPITOR) 40 MG tablet [id number] Yes Take 40 mg by mouth at bedtime. [provider] Taking Active Spouse/Significant Other  B-D UF III MINI PEN NEEDLES 31G X 5 MM MISC [id number] Yes SMARTSIG:1 Each SUB-Q Daily [provider] Taking Active Spouse/Significant Other  Bacitracin-Polymyxin B (NEOSPORIN EX) [id number] Yes Apply 1 application topically daily as needed (wound care). [provider] Taking  Active Spouse/Significant Other  cefpodoxime (VANTIN) 100 MG tablet [id number] Yes Take 100 mg by mouth 2 (two) times daily. [provider] Taking Active   clopidogrel (PLAVIX) 75 MG tablet [id number] Yes Take 1 tablet (75 mg total) by mouth daily. Marty Heck, MD Taking Active   donepezil (ARICEPT) 10 MG tablet [id number] Yes Take 10 mg by mouth at bedtime.  [provider] Taking Active Spouse/Significant Other  finasteride (PROSCAR) 5 MG tablet [id number] Yes Take 5 mg by mouth daily. [provider] Taking Active Spouse/Significant Other  gabapentin (NEURONTIN) 100 MG capsule [id number] Yes Take 100 mg by mouth at bedtime. [provider] Taking Active Spouse/Significant Other  LANTUS 100 UNIT/ML injection [id number] Yes Inject 0.1 mLs (10 Units total) into the skin at bedtime as needed (High blood glucose).  Patient taking differently: Inject 25 Units into the skin at bedtime.   Mercy Riding, MD Taking Active            Med Note Maxwell Caul Oct 27, 2020  9:07 AM) Took 12.5 this  am   levofloxacin (LEVAQUIN) 500 MG tablet [id number] Yes Take 1 tablet (500 mg total) by mouth daily. Orson Eva, MD Taking Active Spouse/Significant Other  metFORMIN (GLUCOPHAGE) 500 MG tablet [id number] Yes Take by mouth 2 (two) times daily with a meal. Take 1 daily for 7 days then 2 times daily [provider] Taking Active   metoprolol succinate (TOPROL-XL) 50 MG 24 hr tablet [id number] Yes Take 1 tablet (50 mg total) by mouth daily. Take with or immediately following a meal. Strader, Fransisco Hertz, PA-C Taking Active   mirtazapine (REMERON)  15 MG tablet [id number] Yes Take 15 mg by mouth at bedtime. [provider] Taking Active Spouse/Significant Other  nicotine (NICODERM CQ - DOSED IN MG/24 HOURS) 21 mg/24hr patch [id number]  Place 1 patch (21 mg total) onto the skin daily as needed (Cessation of smoking).  Patient not taking: No sig reported    Mercy Riding, MD  Active Spouse/Significant Other           Med Note Scotty Court   Thu Jul 21, 2020 11:20 AM) Per wife, pt needs  risperiDONE (RISPERDAL) 0.5 MG tablet [id number] Yes Take 1 tablet (0.5 mg total) by mouth 2 (two) times daily. Orson Eva, MD Taking Active Spouse/Significant Other           Med Note Wilmon Pali, MELISSA R   Wed Oct 19, 2020 11:41 AM) Pt currently has 1 mg tabs, taking 0.5 tabs daily  tamsulosin (FLOMAX) 0.4 MG CAPS capsule [id number] Yes Take 1 capsule (0.4 mg total) by mouth daily. Orson Eva, MD Taking Active Spouse/Significant Other  traMADol (ULTRAM) 50 MG tablet [id number] Yes Take 50 mg by mouth every 6 (six) hours as needed for moderate pain. [provider] Taking Active Spouse/Significant Other  triamcinolone ointment (KENALOG) 0.1 % [id number] Yes Apply 1 application topically daily. Apply to foot [provider] Taking Active Spouse/Significant Other  Vitamin D, Ergocalciferol, (DRISDOL) 1.25 MG (50000 UNIT) CAPS capsule [id number] Yes Take 50,000 Units by mouth every Friday. [provider] Taking Active Spouse/Significant Other  Med List Note Jolayne Haines, CPhT 07/09/16 5643): Dr. [name] [phone]           Care Plan : RN Care Manager Plan of Care  Updates made by Barbaraann Faster, RN since 12/30/2020 12:00 AM     Problem: Complex Care Coordination Needs and disease management in patient with DM II, CKD 3, Afib, PTSD   Priority: High     Long-Range Goal: Establish Plan of Care for Management Complex SDOH Barriers, disease management and Care Coordination Needs in patient with DM II, CKD 3, Afib, PTSD   Start Date: 12/21/2020  This Visit's Progress: On track  Recent Progress: On track  Priority: High  Note:   Current Barriers:  Knowledge Deficits related to plan of care for management of Atrial Fibrillation, DMII, CKD Stage 3, and PTSD/dementia  Care Coordination needs related to Lacks knowledge of  community resource: VA benefits  RN CM Clinical Goal(s):  Patient will work with [employer] administration ([employer]) benefit providers to continue home management of DM II, Afib, CKD, PTSD/dementia as evidenced by decrease in admissions and improved lab values, behavior  through collaboration with Consulting civil engineer, provider, and care team.   Interventions: Follow up outreaches to wife and patient as allowed to further assess and assist with resources/guidance, care coordination and/or disease management/education  Inter-disciplinary care team collaboration (see longitudinal plan of care) Evaluation of current treatment plan related to  self management and patient's adherence to plan as established by provider EPIC in basket message to pcp on 12/30/20 about pt's wife recent inquires on 12/28/20 for home assistance with catheter change monthly and further rehabilitation outpatient near [facility]. Attempts to reach pt and wife unsuccessful on 12/30/20 to gather clarified needs    AFIB Interventions: (Status:  Goal on track:  Yes.) Long Term Goal   Reviewed importance of adherence to anticoagulant exactly as prescribed    Chronic Kidney Disease Interventions:  (Status:  Goal on track:  Yes.) Long Term  Goal Evaluation of current treatment plan related to chronic kidney disease self management and patient's adherence to plan as established by provider      Last practice recorded BP readings:  BP Readings from Last 3 Encounters:  12/06/20 100/60  11/29/20 (!) 144/68  11/02/20 (!) 144/86  Most recent eGFR/CrCl: No results found for: EGFR  No components found for: CRCL    Diabetes Interventions:  (Status:  Goal on track:  Yes.) Long Term Goal Assessed patient's understanding of A1c goal: <7% Discussed plans with patient for ongoing care management follow up and provided patient with direct contact information for care management team Review of patient status, including review of consultants  reports, relevant laboratory and other test results, and medications completed Lab Results  Component Value Date   HGBA1C 9.7 (H) 09/24/2020   Dementia:  (Status:  Condition stable.  Not addressed this visit.)  Long Term Goal Evaluation of current treatment plan related to misuse of: Dementia with other behavioral disturbances Emotional Support Provided to patient/caregiver  Patient Goals/Self-Care Activities: Take all medications as prescribed Attend all scheduled provider appointments  Follow Up Plan:  The patient has been provided with contact information for the care management team and has been advised to call with any health related questions or concerns.  The care management team will reach out to the patient again over the next 30 business days.       Plan: [facility] RN CM scheduled this patient for another call attempt within 4-7 business days Unsuccessful outreach on 12/30/20   Mattix Imhof L. [name], RN, BSN, [place] Coordinator Office number [phone] Mobile number [phone]  Main [facility] number [phone] Fax number [phone]

## 2021-01-04 ENCOUNTER — Other Ambulatory Visit: Payer: Self-pay | Admitting: *Deleted

## 2021-01-04 NOTE — Patient Outreach (Addendum)
[facility]) Care Management  01/04/2021  Allen Yu 29-Oct-1945 [id number]   [facility] second Unsuccessful outreach   Outreach attempt to the listed at the preferred outreach number in EPIC  No answer. [facility] RN CM left HIPAA [facility] Portability and Accountability Act) compliant voicemail message along with CMs contact info.  In message left an updated that when a call had not been returned to RN CM, RN CM sent message to pcp requesting assist with possible assistance at home to have his catheter monthly and orders for outpatient rehabilitation at [facility]    Call to pcp office Dr Willey Blade [phone] automated message reports office closed today at 1230 pm   Plan: [place] scheduled this patient for another call attempt within 4-7 business days Unsuccessful outreach letter sent on 01/04/21 Unsuccessful outreach on 12/30/20 01/04/21   [name], RN, BSN, [place] Coordinator Office number [phone] Mobile number [phone]  Main [facility] number [phone] Fax number [phone]

## 2021-01-05 DIAGNOSIS — I480 Paroxysmal atrial fibrillation: Secondary | ICD-10-CM | POA: Diagnosis not present

## 2021-01-05 DIAGNOSIS — E876 Hypokalemia: Secondary | ICD-10-CM | POA: Diagnosis not present

## 2021-01-05 DIAGNOSIS — N1831 Chronic kidney disease, stage 3a: Secondary | ICD-10-CM | POA: Diagnosis not present

## 2021-01-05 DIAGNOSIS — E1122 Type 2 diabetes mellitus with diabetic chronic kidney disease: Secondary | ICD-10-CM | POA: Diagnosis not present

## 2021-01-10 ENCOUNTER — Encounter: Payer: Self-pay | Admitting: *Deleted

## 2021-01-10 ENCOUNTER — Other Ambulatory Visit: Payer: Self-pay

## 2021-01-10 ENCOUNTER — Other Ambulatory Visit: Payer: Self-pay | Admitting: *Deleted

## 2021-01-10 NOTE — Patient Outreach (Signed)
[facility]) Care Management Telephonic RN Care Manager Note   01/10/2021 Name:  Allen Yu MRN:  [id number] DOB:  21-Jun-1945  Summary: Follow up outreach to patient and wife Allen Yu reports Allen Yu is doing well and continues to maintain his him care interventions She reports the remain busy with various appointments and the patient is voicing frustration with getting out in the cold weather and the time it takes to get to appointments  They went to an appointment to have his eyes evaluate in [place] (medifirst eye) and was informed by a second MD that Allen Yu has Cataracts then [facility] to see an ophthalmologist  Outpatient therapies was ordered by his pcp office and will start 01/26/21 Now has hearing aides [facility] NP and other [place] team members continue to telephonically address patient needs   Allen Yu again voices interest in having possibly a monthly catheter change home visit vs an office visit especially as the weather is becoming colder [facility] staff (Dr Jacalyn Lefevre) is reported to have been seen a few weeks ago.    Recommendations/Changes made from today's visit:  Outreach to [facility] urology office Spoke with Margarita Grizzle who reminded RN CM that monthly catheter changes are not a skilled need for home health agency staff per medicare guideline changes.  RN CM returned a call to Allen Yu wife to update her about this Allen Yu  was very understanding and will continue to take Allen Yu in monthly for catheter changes    Subjective: Allen Yu is an 75 y.o. year old male who is a primary patient of [name], MD. The care management team was consulted for assistance with care management and/or care coordination needs.    Telephonic RN Care Manager completed Telephone Visit today.   Objective:  Medications Reviewed Today     Reviewed by Karoline Caldwell, PA-C (Physician Assistant Certified)  on 67/59/16 at 1250  Med List Status: <None>   Medication Order Taking? Sig Documenting Provider Last Dose Status Informant  acetaminophen (TYLENOL) 500 MG tablet [id number] Yes Take 1,000 mg by mouth every 6 (six) hours as needed for moderate pain. [provider] Taking Active Spouse/Significant Other  aspirin EC 81 MG tablet [id number] Yes Take 1 tablet (81 mg total) by mouth daily. Swallow whole. Marty Heck, MD Taking Active   atorvastatin (LIPITOR) 40 MG tablet [id number] Yes Take 40 mg by mouth at bedtime. [provider] Taking Active Spouse/Significant Other  B-D UF III MINI PEN NEEDLES 31G X 5 MM MISC [id number] Yes SMARTSIG:1 Each SUB-Q Daily [provider] Taking Active Spouse/Significant Other  Bacitracin-Polymyxin B (NEOSPORIN EX) [id number] Yes Apply 1 application topically daily as needed (wound care). [provider] Taking Active Spouse/Significant Other  cefpodoxime (VANTIN) 100 MG tablet [id number] Yes Take 100 mg by mouth 2 (two) times daily. [provider] Taking Active   clopidogrel (PLAVIX) 75 MG tablet [id number] Yes Take 1 tablet (75 mg total) by mouth daily. Marty Heck, MD Taking Active   donepezil (ARICEPT) 10 MG tablet [id number] Yes Take 10 mg by mouth at bedtime.  [provider] Taking Active Spouse/Significant Other  finasteride (PROSCAR) 5 MG tablet [id number] Yes Take 5 mg by mouth daily. [provider] Taking Active Spouse/Significant Other  gabapentin (NEURONTIN) 100 MG capsule [id number] Yes Take 100 mg by mouth at bedtime. [provider] Taking Active Spouse/Significant Other  LANTUS 100 UNIT/ML injection [id number] Yes Inject 0.1  mLs (10 Units total) into the skin at bedtime as needed (High blood glucose).  Patient taking differently: Inject 25 Units into the skin at bedtime.   Mercy Riding, MD Taking Active            Med Note Maxwell Caul Oct 27, 2020  9:07 AM)  Took 12.5 this  am   levofloxacin (LEVAQUIN) 500 MG tablet [id number] Yes Take 1 tablet (500 mg total) by mouth daily. Orson Eva, MD Taking Active Spouse/Significant Other  metFORMIN (GLUCOPHAGE) 500 MG tablet [id number] Yes Take by mouth 2 (two) times daily with a meal. Take 1 daily for 7 days then 2 times daily [provider] Taking Active   metoprolol succinate (TOPROL-XL) 50 MG 24 hr tablet [id number] Yes Take 1 tablet (50 mg total) by mouth daily. Take with or immediately following a meal. Strader, Fransisco Hertz, PA-C Taking Active   mirtazapine (REMERON) 15 MG tablet [id number] Yes Take 15 mg by mouth at bedtime. [provider] Taking Active Spouse/Significant Other  nicotine (NICODERM CQ - DOSED IN MG/24 HOURS) 21 mg/24hr patch [id number]  Place 1 patch (21 mg total) onto the skin daily as needed (Cessation of smoking).  Patient not taking: No sig reported   Mercy Riding, MD  Active Spouse/Significant Other           Med Note Scotty Court   Thu Jul 21, 2020 11:20 AM) Per wife, pt needs  risperiDONE (RISPERDAL) 0.5 MG tablet [id number] Yes Take 1 tablet (0.5 mg total) by mouth 2 (two) times daily. Orson Eva, MD Taking Active Spouse/Significant Other           Med Note Wilmon Pali, MELISSA R   Wed Oct 19, 2020 11:41 AM) Pt currently has 1 mg tabs, taking 0.5 tabs daily  tamsulosin (FLOMAX) 0.4 MG CAPS capsule [id number] Yes Take 1 capsule (0.4 mg total) by mouth daily. Orson Eva, MD Taking Active Spouse/Significant Other  traMADol (ULTRAM) 50 MG tablet [id number] Yes Take 50 mg by mouth every 6 (six) hours as needed for moderate pain. [provider] Taking Active Spouse/Significant Other  triamcinolone ointment (KENALOG) 0.1 % [id number] Yes Apply 1 application topically daily. Apply to foot [provider] Taking Active Spouse/Significant Other  Vitamin D, Ergocalciferol, (DRISDOL) 1.25 MG (50000 UNIT) CAPS capsule [id number] Yes Take 50,000 Units by mouth every  Friday. [provider] Taking Active Spouse/Significant Other  Med List Note Jolayne Haines, CPhT 07/09/16 0920): Dr. [name] [phone]             SDOH:  (Social Determinants of Health) assessments and interventions performed:   Patient Active Problem List   Diagnosis Date Noted   Encounter for fitting and adjustment of hearing aid 12/21/2020   Other specified problems related to psychosocial circumstances 11/21/2020   Sensorineural hearing loss, bilateral 11/21/2020   Tobacco dependence 10/06/2020   Benign prostatic hyperplasia with urinary obstruction 10/06/2020   Above knee amputation of left lower extremity ([place]) 10/06/2020   Other specified counseling 10/06/2020   Severe sepsis ([place]) 09/24/2020   Schizophrenia, unspecified ([place]) 09/24/2020   Unspecified dementia, unspecified severity, without behavioral disturbance, psychotic disturbance, mood disturbance, and anxiety ([place]) 09/24/2020   Lung nodule 09/24/2020   Indwelling Foley catheter present 09/24/2020   Peripheral vascular disease ([place]) 09/24/2020   Mass of testicle 09/24/2020   Problem related to unspecified psychosocial circumstances 08/01/2020   Hypermetropia 07/22/2020   Unspecified atrial fibrillation ([place]) 07/22/2020   Klebsiella  sepsis ([place]) 07/22/2020   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   SIRS (systemic inflammatory response syndrome) ([place]) 14/48/1856   Acute metabolic encephalopathy 31/49/7026   Hyponatremia    S/P AKA (above knee amputation) unilateral, left ([place]) 06/28/2020   Gangrene of left foot ([place]) 06/28/2020   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 ([place]) 05/27/2020   Critical lower limb ischemia ([place]) 05/21/2020   Malnutrition ([place]) 05/19/2020   Plantar callus 12/24/2019   Cavitary pneumonia 09/14/2019   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   Unintentional weight loss 09/14/2019   Coronary artery calcification seen on CT scan 10/28/2018   Hilar  adenopathy 04/10/2016   Lung nodule seen on imaging study 07/09/2015   Acute encephalopathy    Hypoglycemia due to insulin    Type 2 diabetes mellitus with other circulatory complications ([place]) 37/85/8850   Acute kidney injury superimposed on chronic kidney disease ([place]) 07/08/2015   Tobacco use disorder 07/08/2015   Diverticulosis of colon without hemorrhage    Left inguinal hernia 09/16/2013   Altered mental status 11/17/2012   Hypothermia 11/17/2012   Leukocytosis 11/17/2012   Chronic kidney disease, stage 3 unspecified ([place])    Post-traumatic stress disorder, chronic    GERD (gastroesophageal reflux disease)    Essential (primary) hypertension 09/10/2011   Anemia 09/10/2011   Diabetic neuropathy ([place]) 09/10/2011   DDD (degenerative disc disease), lumbar 09/10/2011   Psychosis ([place]) 09/10/2011   Diabetes mellitus type 2, uncontrolled 09/10/2011   Hyperlipemia 09/10/2011   Microalbuminuria 09/10/2011    Care Plan  Review of patient past medical history, allergies, medications, health status, including review of consultants reports, laboratory and other test data, was performed as part of comprehensive evaluation for care management services.   Care Plan : RN Care Manager Plan of Care  Updates made by Barbaraann Faster, RN since 01/10/2021 12:00 AM     Problem: Complex Care Coordination Needs and disease management in patient with DM II, CKD 3, Afib, PTSD   Priority: High     Long-Range Goal: Establish Plan of Care for Management Complex SDOH Barriers, disease management and Care Coordination Needs in patient with DM II, CKD 3, Afib, PTSD   Start Date: 12/21/2020  This Visit's Progress: On track  Recent Progress: On track  Priority: High  Note:   Current Barriers:  Knowledge Deficits related to plan of care for management of Atrial Fibrillation, DMII, CKD Stage 3, and PTSD/dementia  Care Coordination needs related to Lacks knowledge of community resource: [facility]  benefits Health behaviors, bilateral hearing (12/120/22 now has hearing aides), [place] Veteran with PTSD/psychosis and reported psychosocial population concerns, memory concerns   RN CM Clinical Goal(s):  Patient will work with [employer] administration (VA) benefit providers to continue home management of DM II, Afib, CKD, PTSD/dementia as evidenced by decrease in admissions and improved lab values, behavior  through collaboration with Consulting civil engineer, provider, and care team.   Interventions: Follow up outreaches to wife and patient as allowed to further assess and assist with resources/guidance, care coordination and/or disease management/education  Inter-disciplinary care team collaboration (see longitudinal plan of care) Evaluation of current treatment plan related to  self management and patient's adherence to plan as established by provider 01/10/21 care coordination completed for home assistance with catheter change monthly no available via home health per medicare guideline changes (not skilled need) -RN CM outreach to [facility] staff, updated wife 01/10/21 Confirmed with wife further rehabilitation outpatient near [facility] ordered by pcp.  AFIB Interventions: (Status:  Goal on track:  Yes.) Long Term Goal   Reviewed importance of adherence to anticoagulant exactly as prescribed    Chronic Kidney Disease Interventions:  (Status:  Goal on track:  Yes.) Long Term Goal Evaluation of current treatment plan related to chronic kidney disease self management and patient's adherence to plan as established by provider      Last practice recorded BP readings:  BP Readings from Last 3 Encounters:  12/06/20 100/60  11/29/20 (!) 144/68  11/02/20 (!) 144/86  Most recent eGFR/CrCl: No results found for: EGFR  No components found for: CRCL    Diabetes Interventions:  (Status:  Goal on track:  Yes.) Long Term Goal Assessed patient's understanding of A1c goal: <7% Discussed plans  with patient for ongoing care management follow up and provided patient with direct contact information for care management team Review of patient status, including review of consultants reports, relevant laboratory and other test results, and medications completed Lab Results  Component Value Date   HGBA1C 9.7 (H) 09/24/2020   Dementia:  (Status:  Goal on track:  Yes.)  Long Term Goal Evaluation of current treatment plan related to misuse of: Dementia with other behavioral disturbances Emotional Support Provided to patient/caregiver  Patient Goals/Self-Care Activities: Take all medications as prescribed Attend all scheduled provider appointments  Follow Up Plan:  The patient has been provided with contact information for the care management team and has been advised to call with any health related questions or concerns.  The care management team will reach out to the patient again over the next 60 business days.       Plan: The patient has been provided with contact information for the care management team and has been advised to call with any health related questions or concerns.  The care management team will reach out to the patient again over the next 30 business days.  Sultan Pargas L. [name], RN, BSN, [place] Coordinator Office number [phone] [facility] number [phone] Fax number [phone]

## 2021-01-13 ENCOUNTER — Inpatient Hospital Stay (HOSPITAL_COMMUNITY)
Admission: EM | Admit: 2021-01-13 | Discharge: 2021-01-19 | DRG: 291 | Disposition: A | Discharge status: Home Health | Payer: Medicare Other | Attending: Family Medicine | Admitting: Family Medicine

## 2021-01-13 ENCOUNTER — Emergency Department (HOSPITAL_COMMUNITY): Payer: Medicare Other

## 2021-01-13 ENCOUNTER — Encounter (HOSPITAL_COMMUNITY): Payer: Self-pay | Admitting: *Deleted

## 2021-01-13 DIAGNOSIS — F4310 Post-traumatic stress disorder, unspecified: Secondary | ICD-10-CM | POA: Diagnosis present

## 2021-01-13 DIAGNOSIS — Z993 Dependence on wheelchair: Secondary | ICD-10-CM

## 2021-01-13 DIAGNOSIS — E872 Acidosis, unspecified: Secondary | ICD-10-CM

## 2021-01-13 DIAGNOSIS — I130 Hypertensive heart and chronic kidney disease with heart failure and stage 1 through stage 4 chronic kidney disease, or unspecified chronic kidney disease: Principal | ICD-10-CM | POA: Diagnosis present

## 2021-01-13 DIAGNOSIS — N1831 Chronic kidney disease, stage 3a: Secondary | ICD-10-CM | POA: Diagnosis present

## 2021-01-13 DIAGNOSIS — M7989 Other specified soft tissue disorders: Secondary | ICD-10-CM | POA: Diagnosis present

## 2021-01-13 DIAGNOSIS — Z91030 Bee allergy status: Secondary | ICD-10-CM

## 2021-01-13 DIAGNOSIS — N138 Other obstructive and reflux uropathy: Secondary | ICD-10-CM | POA: Diagnosis not present

## 2021-01-13 DIAGNOSIS — I5023 Acute on chronic systolic (congestive) heart failure: Secondary | ICD-10-CM | POA: Diagnosis present

## 2021-01-13 DIAGNOSIS — R0602 Shortness of breath: Secondary | ICD-10-CM | POA: Diagnosis not present

## 2021-01-13 DIAGNOSIS — Z66 Do not resuscitate: Secondary | ICD-10-CM | POA: Diagnosis present

## 2021-01-13 DIAGNOSIS — D631 Anemia in chronic kidney disease: Secondary | ICD-10-CM | POA: Diagnosis present

## 2021-01-13 DIAGNOSIS — I739 Peripheral vascular disease, unspecified: Secondary | ICD-10-CM | POA: Diagnosis present

## 2021-01-13 DIAGNOSIS — I480 Paroxysmal atrial fibrillation: Secondary | ICD-10-CM | POA: Diagnosis present

## 2021-01-13 DIAGNOSIS — K219 Gastro-esophageal reflux disease without esophagitis: Secondary | ICD-10-CM | POA: Diagnosis present

## 2021-01-13 DIAGNOSIS — E1159 Type 2 diabetes mellitus with other circulatory complications: Secondary | ICD-10-CM | POA: Diagnosis present

## 2021-01-13 DIAGNOSIS — F17210 Nicotine dependence, cigarettes, uncomplicated: Secondary | ICD-10-CM | POA: Diagnosis present

## 2021-01-13 DIAGNOSIS — E11649 Type 2 diabetes mellitus with hypoglycemia without coma: Secondary | ICD-10-CM | POA: Diagnosis present

## 2021-01-13 DIAGNOSIS — Z885 Allergy status to narcotic agent status: Secondary | ICD-10-CM

## 2021-01-13 DIAGNOSIS — Z794 Long term (current) use of insulin: Secondary | ICD-10-CM | POA: Diagnosis not present

## 2021-01-13 DIAGNOSIS — E1151 Type 2 diabetes mellitus with diabetic peripheral angiopathy without gangrene: Secondary | ICD-10-CM | POA: Diagnosis present

## 2021-01-13 DIAGNOSIS — E1122 Type 2 diabetes mellitus with diabetic chronic kidney disease: Secondary | ICD-10-CM | POA: Diagnosis not present

## 2021-01-13 DIAGNOSIS — Z89612 Acquired absence of left leg above knee: Secondary | ICD-10-CM | POA: Diagnosis not present

## 2021-01-13 DIAGNOSIS — J9601 Acute respiratory failure with hypoxia: Secondary | ICD-10-CM | POA: Diagnosis present

## 2021-01-13 DIAGNOSIS — R918 Other nonspecific abnormal finding of lung field: Secondary | ICD-10-CM | POA: Diagnosis not present

## 2021-01-13 DIAGNOSIS — Z8249 Family history of ischemic heart disease and other diseases of the circulatory system: Secondary | ICD-10-CM | POA: Diagnosis not present

## 2021-01-13 DIAGNOSIS — Z7982 Long term (current) use of aspirin: Secondary | ICD-10-CM

## 2021-01-13 DIAGNOSIS — F0393 Unspecified dementia, unspecified severity, with mood disturbance: Secondary | ICD-10-CM | POA: Diagnosis not present

## 2021-01-13 DIAGNOSIS — F209 Schizophrenia, unspecified: Secondary | ICD-10-CM | POA: Clinically undetermined

## 2021-01-13 DIAGNOSIS — K761 Chronic passive congestion of liver: Secondary | ICD-10-CM | POA: Diagnosis present

## 2021-01-13 DIAGNOSIS — I4891 Unspecified atrial fibrillation: Secondary | ICD-10-CM | POA: Diagnosis present

## 2021-01-13 DIAGNOSIS — Z888 Allergy status to other drugs, medicaments and biological substances status: Secondary | ICD-10-CM

## 2021-01-13 DIAGNOSIS — N189 Chronic kidney disease, unspecified: Secondary | ICD-10-CM | POA: Diagnosis not present

## 2021-01-13 DIAGNOSIS — E1142 Type 2 diabetes mellitus with diabetic polyneuropathy: Secondary | ICD-10-CM | POA: Diagnosis present

## 2021-01-13 DIAGNOSIS — R600 Localized edema: Secondary | ICD-10-CM | POA: Diagnosis not present

## 2021-01-13 DIAGNOSIS — E1110 Type 2 diabetes mellitus with ketoacidosis without coma: Secondary | ICD-10-CM | POA: Diagnosis not present

## 2021-01-13 DIAGNOSIS — Z20822 Contact with and (suspected) exposure to covid-19: Secondary | ICD-10-CM | POA: Diagnosis present

## 2021-01-13 DIAGNOSIS — N401 Enlarged prostate with lower urinary tract symptoms: Secondary | ICD-10-CM | POA: Diagnosis present

## 2021-01-13 DIAGNOSIS — J90 Pleural effusion, not elsewhere classified: Secondary | ICD-10-CM | POA: Diagnosis not present

## 2021-01-13 DIAGNOSIS — E1310 Other specified diabetes mellitus with ketoacidosis without coma: Secondary | ICD-10-CM

## 2021-01-13 DIAGNOSIS — R9431 Abnormal electrocardiogram [ECG] [EKG]: Secondary | ICD-10-CM | POA: Diagnosis not present

## 2021-01-13 DIAGNOSIS — Z978 Presence of other specified devices: Secondary | ICD-10-CM | POA: Diagnosis not present

## 2021-01-13 DIAGNOSIS — I5043 Acute on chronic combined systolic (congestive) and diastolic (congestive) heart failure: Secondary | ICD-10-CM | POA: Diagnosis not present

## 2021-01-13 DIAGNOSIS — Z9114 Patient's other noncompliance with medication regimen: Secondary | ICD-10-CM

## 2021-01-13 DIAGNOSIS — N179 Acute kidney failure, unspecified: Secondary | ICD-10-CM | POA: Diagnosis present

## 2021-01-13 DIAGNOSIS — I700 Atherosclerosis of aorta: Secondary | ICD-10-CM | POA: Diagnosis not present

## 2021-01-13 DIAGNOSIS — R609 Edema, unspecified: Secondary | ICD-10-CM

## 2021-01-13 DIAGNOSIS — Z79899 Other long term (current) drug therapy: Secondary | ICD-10-CM

## 2021-01-13 DIAGNOSIS — Z7984 Long term (current) use of oral hypoglycemic drugs: Secondary | ICD-10-CM

## 2021-01-13 DIAGNOSIS — Z515 Encounter for palliative care: Secondary | ICD-10-CM | POA: Diagnosis not present

## 2021-01-13 DIAGNOSIS — Z7189 Other specified counseling: Secondary | ICD-10-CM | POA: Diagnosis not present

## 2021-01-13 HISTORY — DX: Chronic systolic (congestive) heart failure: I50.22

## 2021-01-13 HISTORY — DX: Chronic kidney disease, stage 3 unspecified: N18.30

## 2021-01-13 HISTORY — DX: Dilated cardiomyopathy: I42.0

## 2021-01-13 LAB — COMPREHENSIVE METABOLIC PANEL
ALT: 98 U/L — ABNORMAL HIGH (ref 0–44)
AST: 83 U/L — ABNORMAL HIGH (ref 15–41)
Albumin: 3.6 g/dL (ref 3.5–5.0)
Alkaline Phosphatase: 223 U/L — ABNORMAL HIGH (ref 38–126)
Anion gap: 18 — ABNORMAL HIGH (ref 5–15)
BUN: 48 mg/dL — ABNORMAL HIGH (ref 8–23)
CO2: 21 mmol/L — ABNORMAL LOW (ref 22–32)
Calcium: 8.9 mg/dL (ref 8.9–10.3)
Chloride: 97 mmol/L — ABNORMAL LOW (ref 98–111)
Creatinine, Ser: 2.21 mg/dL — ABNORMAL HIGH (ref 0.61–1.24)
GFR, Estimated: 30 mL/min — ABNORMAL LOW (ref >60)
Glucose, Bld: 319 mg/dL — ABNORMAL HIGH (ref 70–99)
Potassium: 3.8 mmol/L (ref 3.5–5.1)
Sodium: 136 mmol/L (ref 135–145)
Total Bilirubin: 1.1 mg/dL (ref 0.3–1.2)
Total Protein: 7.4 g/dL (ref 6.5–8.1)

## 2021-01-13 LAB — CBC WITH DIFFERENTIAL/PLATELET
Abs Immature Granulocytes: 0.02 10*3/uL (ref 0.00–0.07)
Basophils Absolute: 0 10*3/uL (ref 0.0–0.1)
Basophils Relative: 0 %
Eosinophils Absolute: 0 10*3/uL (ref 0.0–0.5)
Eosinophils Relative: 0 %
HCT: 26.6 % — ABNORMAL LOW (ref 39.0–52.0)
Hemoglobin: 8.8 g/dL — ABNORMAL LOW (ref 13.0–17.0)
Immature Granulocytes: 0 %
Lymphocytes Relative: 12 %
Lymphs Abs: 0.9 10*3/uL (ref 0.7–4.0)
MCH: 31.2 pg (ref 26.0–34.0)
MCHC: 33.1 g/dL (ref 30.0–36.0)
MCV: 94.3 fL (ref 80.0–100.0)
Monocytes Absolute: 0.3 10*3/uL (ref 0.1–1.0)
Monocytes Relative: 4 %
Neutro Abs: 6.1 10*3/uL (ref 1.7–7.7)
Neutrophils Relative %: 84 %
Platelets: 353 10*3/uL (ref 150–400)
RBC: 2.82 MIL/uL — ABNORMAL LOW (ref 4.22–5.81)
RDW: 15.9 % — ABNORMAL HIGH (ref 11.5–15.5)
WBC: 7.4 10*3/uL (ref 4.0–10.5)
nRBC: 0.3 % — ABNORMAL HIGH (ref 0.0–0.2)

## 2021-01-13 LAB — BLOOD GAS, VENOUS
Acid-Base Excess: 0.5 mmol/L (ref 0.0–2.0)
Bicarbonate: 23.7 mmol/L (ref 20.0–28.0)
Drawn by: 5212
FIO2: 32
O2 Saturation: 20 %
Patient temperature: 36.1
pCO2, Ven: 39.7 mmHg — ABNORMAL LOW (ref 44.0–60.0)
pH, Ven: 7.408 (ref 7.250–7.430)
pO2, Ven: <31 mmHg — CL (ref 32.0–45.0)

## 2021-01-13 LAB — GLUCOSE, CAPILLARY: Glucose-Capillary: 115 mg/dL — ABNORMAL HIGH (ref 70–99)

## 2021-01-13 LAB — RESP PANEL BY RT-PCR (FLU A&B, COVID) ARPGX2
Influenza A by PCR: NEGATIVE
Influenza B by PCR: NEGATIVE
SARS Coronavirus 2 by RT PCR: NEGATIVE

## 2021-01-13 LAB — TROPONIN I (HIGH SENSITIVITY)
Troponin I (High Sensitivity): 63 ng/L — ABNORMAL HIGH (ref <18)
Troponin I (High Sensitivity): 56 ng/L — ABNORMAL HIGH (ref <18)

## 2021-01-13 LAB — BETA-HYDROXYBUTYRIC ACID: Beta-Hydroxybutyric Acid: 0.35 mmol/L — ABNORMAL HIGH (ref 0.05–0.27)

## 2021-01-13 LAB — LACTIC ACID, PLASMA
Lactic Acid, Venous: 6.1 mmol/L (ref 0.5–1.9)
Lactic Acid, Venous: 6.3 mmol/L (ref 0.5–1.9)
Lactic Acid, Venous: 5.6 mmol/L (ref 0.5–1.9)

## 2021-01-13 LAB — BRAIN NATRIURETIC PEPTIDE: B Natriuretic Peptide: 2873 pg/mL — ABNORMAL HIGH (ref 0.0–100.0)

## 2021-01-13 LAB — CBG MONITORING, ED
Glucose-Capillary: 290 mg/dL — ABNORMAL HIGH (ref 70–99)
Glucose-Capillary: 211 mg/dL — ABNORMAL HIGH (ref 70–99)

## 2021-01-13 MED ORDER — METOPROLOL SUCCINATE ER 50 MG PO TB24
50.0000 mg | ORAL_TABLET | Freq: Every day | ORAL | Status: DC
  Administered 2021-01-14 – 2021-01-19 (×6): 50 mg via ORAL
  Filled 2021-01-13 (×6): qty 1

## 2021-01-13 MED ORDER — FINASTERIDE 5 MG PO TABS
5.0000 mg | ORAL_TABLET | Freq: Every day | ORAL | Status: DC
  Administered 2021-01-14 – 2021-01-19 (×6): 5 mg via ORAL
  Filled 2021-01-13 (×6): qty 1

## 2021-01-13 MED ORDER — DONEPEZIL HCL 5 MG PO TABS
10.0000 mg | ORAL_TABLET | Freq: Every day | ORAL | Status: DC
  Administered 2021-01-14 – 2021-01-17 (×4): 10 mg via ORAL
  Filled 2021-01-13 (×6): qty 2

## 2021-01-13 MED ORDER — CLOPIDOGREL BISULFATE 75 MG PO TABS
75.0000 mg | ORAL_TABLET | Freq: Every day | ORAL | Status: DC
  Administered 2021-01-14 – 2021-01-19 (×6): 75 mg via ORAL
  Filled 2021-01-13 (×6): qty 1

## 2021-01-13 MED ORDER — HALOPERIDOL LACTATE 5 MG/ML IJ SOLN
5.0000 mg | Freq: Four times a day (QID) | INTRAMUSCULAR | Status: DC | PRN
  Administered 2021-01-14 – 2021-01-18 (×6): 5 mg via INTRAVENOUS
  Filled 2021-01-13 (×6): qty 1

## 2021-01-13 MED ORDER — POTASSIUM CHLORIDE 10 MEQ/100ML IV SOLN
10.0000 meq | INTRAVENOUS | Status: AC
  Administered 2021-01-13 (×2): 10 meq via INTRAVENOUS
  Filled 2021-01-13 (×2): qty 100

## 2021-01-13 MED ORDER — VANCOMYCIN HCL IN DEXTROSE 1-5 GM/200ML-% IV SOLN: 1000.0000 mg | Freq: Once | INTRAVENOUS | Status: DC

## 2021-01-13 MED ORDER — HEPARIN SODIUM (PORCINE) 5000 UNIT/ML IJ SOLN
5000.0000 [IU] | Freq: Three times a day (TID) | INTRAMUSCULAR | Status: DC
  Administered 2021-01-13 – 2021-01-19 (×18): 5000 [IU] via SUBCUTANEOUS
  Filled 2021-01-13 (×17): qty 1

## 2021-01-13 MED ORDER — ASPIRIN EC 81 MG PO TBEC
81.0000 mg | DELAYED_RELEASE_TABLET | Freq: Every day | ORAL | Status: DC
  Administered 2021-01-14 – 2021-01-19 (×6): 81 mg via ORAL
  Filled 2021-01-13 (×6): qty 1

## 2021-01-13 MED ORDER — ACETAMINOPHEN 325 MG PO TABS: 650.0000 mg | ORAL_TABLET | Freq: Four times a day (QID) | ORAL | Status: DC | PRN

## 2021-01-13 MED ORDER — OXYCODONE HCL 5 MG PO TABS
5.0000 mg | ORAL_TABLET | ORAL | Status: DC | PRN
  Administered 2021-01-18: 19:00:00 5 mg via ORAL
  Filled 2021-01-13: qty 1

## 2021-01-13 MED ORDER — ONDANSETRON HCL 4 MG/2ML IJ SOLN: 4.0000 mg | Freq: Four times a day (QID) | INTRAMUSCULAR | Status: DC | PRN

## 2021-01-13 MED ORDER — METRONIDAZOLE 500 MG/100ML IV SOLN
500.0000 mg | Freq: Once | INTRAVENOUS | Status: AC
  Administered 2021-01-13: 22:00:00 500 mg via INTRAVENOUS
  Filled 2021-01-13: qty 100

## 2021-01-13 MED ORDER — SODIUM CHLORIDE 0.9 % IV SOLN
2.0000 g | Freq: Once | INTRAVENOUS | Status: DC
  Filled 2021-01-13: qty 2

## 2021-01-13 MED ORDER — SODIUM CHLORIDE 0.9 % IV BOLUS
500.0000 mL | Freq: Once | INTRAVENOUS | Status: AC
  Administered 2021-01-13: 21:00:00 500 mL via INTRAVENOUS

## 2021-01-13 MED ORDER — INSULIN REGULAR(HUMAN) IN NACL 100-0.9 UT/100ML-% IV SOLN
INTRAVENOUS | Status: DC
  Administered 2021-01-13: 21:00:00 7.5 [IU]/h via INTRAVENOUS
  Filled 2021-01-13: qty 100

## 2021-01-13 MED ORDER — MIRTAZAPINE 15 MG PO TABS
15.0000 mg | ORAL_TABLET | Freq: Every day | ORAL | Status: DC
  Administered 2021-01-14 – 2021-01-17 (×4): 15 mg via ORAL
  Filled 2021-01-13 (×6): qty 1

## 2021-01-13 MED ORDER — ALBUTEROL SULFATE HFA 108 (90 BASE) MCG/ACT IN AERS
2.0000 | INHALATION_SPRAY | Freq: Once | RESPIRATORY_TRACT | Status: AC
  Administered 2021-01-13: 19:00:00 2 via RESPIRATORY_TRACT
  Filled 2021-01-13: qty 6.7

## 2021-01-13 MED ORDER — TAMSULOSIN HCL 0.4 MG PO CAPS
0.4000 mg | ORAL_CAPSULE | Freq: Every day | ORAL | Status: DC
  Administered 2021-01-14 – 2021-01-19 (×6): 0.4 mg via ORAL
  Filled 2021-01-13 (×6): qty 1

## 2021-01-13 MED ORDER — ACETAMINOPHEN 650 MG RE SUPP: 650.0000 mg | Freq: Four times a day (QID) | RECTAL | Status: DC | PRN

## 2021-01-13 MED ORDER — DEXTROSE IN LACTATED RINGERS 5 % IV SOLN: INTRAVENOUS | Status: DC

## 2021-01-13 MED ORDER — FUROSEMIDE 10 MG/ML IJ SOLN
40.0000 mg | Freq: Every day | INTRAMUSCULAR | Status: DC
  Administered 2021-01-14: 11:00:00 40 mg via INTRAVENOUS
  Filled 2021-01-13: qty 4

## 2021-01-13 MED ORDER — VANCOMYCIN HCL 750 MG/150ML IV SOLN
750.0000 mg | INTRAVENOUS | Status: DC
  Administered 2021-01-14: 750 mg via INTRAVENOUS
  Filled 2021-01-13 (×3): qty 150

## 2021-01-13 MED ORDER — SODIUM CHLORIDE 0.9 % IV SOLN
1.0000 g | Freq: Two times a day (BID) | INTRAVENOUS | Status: DC
  Administered 2021-01-14 (×2): 1 g via INTRAVENOUS
  Filled 2021-01-13 (×7): qty 1

## 2021-01-13 MED ORDER — DEXTROSE 50 % IV SOLN
0.0000 mL | INTRAVENOUS | Status: DC | PRN
  Administered 2021-01-15 (×5): 50 mL via INTRAVENOUS
  Filled 2021-01-13 (×6): qty 50

## 2021-01-13 MED ORDER — ONDANSETRON HCL 4 MG PO TABS: 4.0000 mg | ORAL_TABLET | Freq: Four times a day (QID) | ORAL | Status: DC | PRN

## 2021-01-13 MED ORDER — RISPERIDONE 1 MG PO TABS
1.0000 mg | ORAL_TABLET | Freq: Two times a day (BID) | ORAL | Status: DC
  Administered 2021-01-14 – 2021-01-19 (×10): 1 mg via ORAL
  Filled 2021-01-13 (×12): qty 1

## 2021-01-13 MED ORDER — NICOTINE 21 MG/24HR TD PT24
21.0000 mg | MEDICATED_PATCH | Freq: Every day | TRANSDERMAL | Status: DC
  Administered 2021-01-14 – 2021-01-19 (×6): 21 mg via TRANSDERMAL
  Filled 2021-01-13 (×6): qty 1

## 2021-01-13 MED ORDER — SODIUM CHLORIDE 0.9 % IV BOLUS: 2000.0000 mL | Freq: Once | INTRAVENOUS | Status: DC

## 2021-01-13 MED ORDER — LACTATED RINGERS IV SOLN: INTRAVENOUS | Status: DC

## 2021-01-13 MED ORDER — IPRATROPIUM-ALBUTEROL 0.5-2.5 (3) MG/3ML IN SOLN
3.0000 mL | Freq: Once | RESPIRATORY_TRACT | Status: AC
  Administered 2021-01-13: 20:00:00 3 mL via RESPIRATORY_TRACT
  Filled 2021-01-13: qty 3

## 2021-01-13 MED ORDER — ATORVASTATIN CALCIUM 40 MG PO TABS
40.0000 mg | ORAL_TABLET | Freq: Every day | ORAL | Status: DC
  Administered 2021-01-14 – 2021-01-17 (×4): 40 mg via ORAL
  Filled 2021-01-13 (×6): qty 1

## 2021-01-13 NOTE — ED Provider Notes (Signed)
Emergency Department Provider Note   I have reviewed the triage vital signs and the nursing notes.   HISTORY  Chief Complaint Shortness of Breath   HPI Allen Yu is a 75 y.o. male with past medical history reviewed below including paroxysmal A. fib, diabetes, smoking history, and peripheral artery disease presents with shortness of breath starting today.  Patient's wife, at bedside, states that he seemed much more short of breath today when compared to his normal.  He does continue to smoke but does not require home oxygen.  She states he has had hospitalizations in the past where he is needed oxygen but ultimately been able to wean.  She not noticed fever or cough in particular.  No congestion.  No vomiting.  Patient tells me that his chest hurts "a little" but is unable to provide more information regarding the type of pain or if it radiates.   Level 5 caveat: HOH   Past Medical History:  Diagnosis Date   Anemia    Arthritis    Carotid stenosis    Chronic back pain    Chronic kidney disease    Constipation    Dementia (HCC)    Diabetes mellitus    GERD (gastroesophageal reflux disease)    History of kidney stones    Hypertension    Lung nodule    PAF (paroxysmal atrial fibrillation) ([place])    Peripheral neuropathy    Peripheral vascular disease (HCC)    PTSD (post-traumatic stress disorder)    Schizophrenia ([place])    Tuberculosis    Treated    Patient Active Problem List   Diagnosis Date Noted   Acute respiratory failure with hypoxia ([place]) 01/13/2021   Encounter for fitting and adjustment of hearing aid 12/21/2020   Other specified problems related to psychosocial circumstances 11/21/2020   Sensorineural hearing loss, bilateral 11/21/2020   Tobacco dependence 10/06/2020   Benign prostatic hyperplasia with urinary obstruction 10/06/2020   Above knee amputation of left lower extremity ([place]) 10/06/2020   Other specified counseling 10/06/2020   Severe sepsis  ([place]) 09/24/2020   Schizophrenia, unspecified ([place]) 09/24/2020   Unspecified dementia, unspecified severity, without behavioral disturbance, psychotic disturbance, mood disturbance, and anxiety ([place]) 09/24/2020   Lung nodule 09/24/2020   Indwelling Foley catheter present 09/24/2020   Peripheral vascular disease ([place]) 09/24/2020   Mass of testicle 09/24/2020   Problem related to unspecified psychosocial circumstances 08/01/2020   Hypermetropia 07/22/2020   Unspecified atrial fibrillation ([place]) 07/22/2020   Klebsiella sepsis ([place]) 07/22/2020   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   SIRS (systemic inflammatory response syndrome) ([place]) 16/01/930   Acute metabolic encephalopathy 35/57/3220   Hyponatremia    S/P AKA (above knee amputation) unilateral, left ([place]) 06/28/2020   Gangrene of left foot ([place]) 06/28/2020   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 ([place]) 05/27/2020   Critical lower limb ischemia ([place]) 05/21/2020   Malnutrition ( [place]) 05/19/2020   Plantar callus 12/24/2019   Cavitary pneumonia 09/14/2019   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   Unintentional weight loss 09/14/2019   Coronary artery calcification seen on CT scan 10/28/2018   Hilar adenopathy 04/10/2016   Lung nodule seen on imaging study 07/09/2015   Acute encephalopathy    Hypoglycemia due to insulin    Type 2 diabetes mellitus with other circulatory complications ([place]) 25/42/7062   Acute kidney injury superimposed on chronic kidney disease ([place]) 07/08/2015   Tobacco use disorder 07/08/2015   Diverticulosis of colon without hemorrhage  Left inguinal hernia 09/16/2013   Altered mental status 11/17/2012   Hypothermia 11/17/2012   Leukocytosis 11/17/2012   Chronic kidney disease, stage 3 unspecified (HCC)    Post-traumatic stress disorder, chronic    GERD (gastroesophageal reflux disease)    Essential (primary) hypertension 09/10/2011   Anemia 09/10/2011   Diabetic neuropathy ([place])  09/10/2011   DDD (degenerative disc disease), lumbar 09/10/2011   Psychosis ([place]) 09/10/2011   Diabetes mellitus type 2, uncontrolled 09/10/2011   Hyperlipemia 09/10/2011   Microalbuminuria 09/10/2011    Past Surgical History:  Procedure Laterality Date   ABDOMINAL AORTOGRAM W/LOWER EXTREMITY N/A 05/20/2020   Procedure: ABDOMINAL AORTOGRAM W/LOWER EXTREMITY;  Surgeon: Elam Dutch, MD;  Location: [facility];  Service: Cardiovascular;  Laterality: N/A;   ABDOMINAL AORTOGRAM W/LOWER EXTREMITY N/A 10/27/2020   Procedure: ABDOMINAL AORTOGRAM W/LOWER EXTREMITY;  Surgeon: Marty Heck, MD;  Location: [facility];  Service: Cardiovascular;  Laterality: N/A;   AMPUTATION Left 06/28/2020   Procedure: AMPUTATION ABOVE KNEE LEFT;  Surgeon: Rosetta Posner, MD;  Location: [facility] OR;  Service: Vascular;  Laterality: Left;   [place], 2013   x2   BLADDER SURGERY     02   CHOLECYSTECTOMY     COLONOSCOPY     COLONOSCOPY N/A 12/07/2014   Procedure: COLONOSCOPY;  Surgeon: Daneil Dolin, MD;  [facility];  Service: Endoscopy;  Laterality: N/A;  10:30 Am   EYE SURGERY Bilateral    removed metal from eye   FEMORAL-TIBIAL BYPASS GRAFT Left 05/27/2020   Procedure: LEFT FEMORAL TO PERONEAL ARTERY BYPASS;  Surgeon: Rosetta Posner, MD;  Location: [facility];  Service: Vascular;  Laterality: Left;   HERNIA REPAIR Right    INGUINAL HERNIA REPAIR Left 11/03/2013   Procedure: HERNIA REPAIR INGUINAL ADULT;  Surgeon: Gayland Curry, MD;  Location: [facility];  Service: General;  Laterality: Left;   PERIPHERAL VASCULAR INTERVENTION  10/27/2020   Procedure: PERIPHERAL VASCULAR INTERVENTION;  Surgeon: Marty Heck, MD;  Location: [facility];  Service: Cardiovascular;;   SHOULDER SURGERY     RIGHT SHOULDER    VIDEO BRONCHOSCOPY WITH ENDOBRONCHIAL NAVIGATION N/A 07/10/2019   Procedure: VIDEO BRONCHOSCOPY WITH ENDOBRONCHIAL NAVIGATION;  Surgeon: Melrose Nakayama, MD;  Location: [facility];  Service: Thoracic;  Laterality: N/A;    Allergies Bee venom, Codeine, Propoxyphene, and Valsartan  Family History  Problem Relation Age of Onset   Heart disease Mother        before age 32    Social History Social History   Tobacco Use   Smoking status: Every Day    Packs/day: 0.50    Types: Cigarettes   Smokeless tobacco: Never   Tobacco comments:    burns them up  Vaping Use   Vaping Use: Never used  Substance Use Topics   Alcohol use: No    Alcohol/week: 0.0 standard drinks   Drug use: Yes    Types: Marijuana    Comment: almost daily for pain    Review of Systems  Constitutional: No fever/chills Eyes: No visual changes. ENT: No sore throat. Cardiovascular: Positive chest pain. Respiratory: Positive shortness of breath. Gastrointestinal: No abdominal pain.  No nausea, no vomiting.  No diarrhea.  No constipation. Genitourinary: Negative for dysuria. Musculoskeletal: Negative for back pain. Skin: Negative for rash. Neurological: Negative for headaches, focal weakness or numbness.  10-point ROS otherwise negative.  ____________________________________________   PHYSICAL  EXAM:  VITAL SIGNS: ED Triage Vitals  Enc Vitals Group     BP 01/13/21 1830 (!) 138/126     Pulse Rate 01/13/21 1830 (!) 30     Resp 01/13/21 1839 (!) 24     Temp 01/13/21 1839 (!) 97.1 F (36.2 C)     Temp Source 01/13/21 1839 Axillary     SpO2 01/13/21 1830 (!) 75 %    Constitutional: Alert. Some increased WOB noted but overall participating in history and exam.  Eyes: Conjunctivae are normal.  Head: Atraumatic. Nose: No congestion/rhinnorhea. Mouth/Throat: Mucous membranes are moist.   Neck: No stridor.   Cardiovascular: Normal rate, regular rhythm. Good peripheral circulation. Grossly normal heart sounds. No JVP.  Respiratory: Increased respiratory effort.  No retractions. Lungs diminished with some end-expiratory wheezing.  Gastrointestinal:  Soft and nontender. No distention.  Musculoskeletal: AKA on the left. No edema in the RLE.  Neurologic:  Normal speech and language. No gross focal neurologic deficits are appreciated.  Skin:  Skin is warm, dry and intact. No rash noted.   ____________________________________________   LABS (all labs ordered are listed, but only abnormal results are displayed)  Labs Reviewed  COMPREHENSIVE METABOLIC PANEL - Abnormal; Notable for the following components:      Result Value   Chloride 97 (*)    CO2 21 (*)    Glucose, Bld 319 (*)    BUN 48 (*)    Creatinine, Ser 2.21 (*)    AST 83 (*)    ALT 98 (*)    Alkaline Phosphatase 223 (*)    GFR, Estimated 30 (*)    Anion gap 18 (*)    All other components within normal limits  BRAIN NATRIURETIC PEPTIDE - Abnormal; Notable for the following components:   B Natriuretic Peptide 2,873.0 (*)    All other components within normal limits  CBC WITH DIFFERENTIAL/PLATELET - Abnormal; Notable for the following components:   RBC 2.82 (*)    Hemoglobin 8.8 (*)    HCT 26.6 (*)    RDW 15.9 (*)    nRBC 0.3 (*)    All other components within normal limits  LACTIC ACID, PLASMA - Abnormal; Notable for the following components:   Lactic Acid, Venous 6.1 (*)    All other components within normal limits  LACTIC ACID, PLASMA - Abnormal; Notable for the following components:   Lactic Acid, Venous 6.3 (*)    All other components within normal limits  BETA-HYDROXYBUTYRIC ACID - Abnormal; Notable for the following components:   Beta-Hydroxybutyric Acid 0.35 (*)    All other components within normal limits  BLOOD GAS, VENOUS - Abnormal; Notable for the following components:   pCO2, Ven 39.7 (*)    pO2, Ven <31.0 (*)    All other components within normal limits  CBG MONITORING, ED - Abnormal; Notable for the following components:   Glucose-Capillary 290 (*)    All other components within normal limits  TROPONIN I (HIGH SENSITIVITY) - Abnormal; Notable for  the following components:   Troponin I (High Sensitivity) 63 (*)    All other components within normal limits  TROPONIN I (HIGH SENSITIVITY) - Abnormal; Notable for the following components:   Troponin I (High Sensitivity) 56 (*)    All other components within normal limits  RESP PANEL BY RT-PCR (FLU A&B, COVID) ARPGX2  CULTURE, BLOOD (ROUTINE X 2)  CULTURE, BLOOD (ROUTINE X 2)  URINE CULTURE  URINALYSIS, ROUTINE W REFLEX MICROSCOPIC  CBC  BETA-HYDROXYBUTYRIC ACID  LACTIC ACID, PLASMA  LACTIC ACID, PLASMA  BRAIN NATRIURETIC PEPTIDE  COMPREHENSIVE METABOLIC PANEL  MAGNESIUM  CBC WITH DIFFERENTIAL/PLATELET  TSH   ____________________________________________  EKG   EKG Interpretation  Date/Time:  Friday January 13 2021 18:34:34 EST Ventricular Rate:  79 PR Interval:  141 QRS Duration: 92 QT Interval:  455 QTC Calculation: 522 R Axis:   87 Text Interpretation: Sinus rhythm Ventricular premature complex Borderline right axis deviation Low voltage, extremity leads Prolonged QT interval Baseline wander in lead(s) V6 Confirmed by Nanda Quinton [phone]) on 01/13/2021 7:01:08 PM        ____________________________________________  RADIOLOGY  DG Chest Portable 1 View  Result Date: 01/13/2021 CLINICAL DATA:  Shortness of breath. EXAM: PORTABLE CHEST 1 VIEW COMPARISON:  September 27, 2020 FINDINGS: Stable moderate to marked severity diffuse chronic appearing increased interstitial lung markings are seen. Mild bilateral lower lobe infiltrates are seen, left greater than right. There is a small left pleural effusion. No pneumothorax is identified. The cardiac silhouette is enlarged and unchanged in size. There is moderate severity calcification of the aortic arch. A radiopaque fusion plate and screws are seen overlying the cervical spine. IMPRESSION: 1. Stable moderate to marked severity diffuse chronic appearing increased interstitial lung markings with mild bilateral lower lobe  infiltrates, left greater than right. 2. Small left pleural effusion. Electronically Signed   By: Virgina Norfolk M.D.   On: 01/13/2021 19:10    ____________________________________________   PROCEDURES  Procedure(s) performed:   Procedures  CRITICAL CARE Performed by: Margette Fast Total critical care time: 35 minutes Critical care time was exclusive of separately billable procedures and treating other patients. Critical care was necessary to treat or prevent imminent or life-threatening deterioration. Critical care was time spent personally by me on the following activities: development of treatment plan with patient and/or surrogate as well as nursing, discussions with consultants, evaluation of patient's response to treatment, examination of patient, obtaining history from patient or surrogate, ordering and performing treatments and interventions, ordering and review of laboratory studies, ordering and review of radiographic studies, pulse oximetry and re-evaluation of patient's condition.  Nanda Quinton, MD Emergency Medicine  ____________________________________________   INITIAL IMPRESSION / ASSESSMENT AND PLAN / ED COURSE  Pertinent labs & imaging results that were available during my care of the patient were reviewed by me and considered in my medical decision making (see chart for details).  Patient presents emergency department with shortness of breath.  He has a temp on arrival of 97.1 F. Foley looking dirty. Possible infection source. No clinical findings of CHF but patient's last ECHO with reduced EF to 40%. Will add BNP.   Differential includes all life-threatening causes for chest pain. This includes but is not exclusive to acute coronary syndrome, aortic dissection, pulmonary embolism, cardiac tamponade, community-acquired pneumonia, pericarditis, musculoskeletal chest wall pain, etc.  08:34 PM  Initiated sepsis orders with patient's lactic acid being greater than 6.   He has a very dirty appearing Foley catheter in place which we will exchange as a possible source.  However, after additional lab work coming back patient's BNP is significantly elevated at 2800 his troponin is mildly elevated at 63.  He does not appear acutely volume overloaded on exam but considering that this lactate may be related more to congestive heart type symptoms.  Patient also getting albuterol which can contribute to lactic acidosis.  We will continue to give fluids but will give more gentle bolus starting at 500 mL and reassess.  Will  cover with antibiotics.   Discussed patient's case with [facility] to request admission. Patient and family (if present) updated with plan. Care transferred to [facility] service.  I reviewed all nursing notes, vitals, pertinent old records, EKGs, labs, imaging (as available).  ____________________________________________  FINAL CLINICAL IMPRESSION(S) / ED DIAGNOSES  Final diagnoses:  Acute respiratory failure with hypoxia ([place])  Diabetic ketoacidosis without coma associated with other specified diabetes mellitus ([place])  Lactic acidosis     MEDICATIONS GIVEN DURING THIS VISIT:  Medications  metroNIDAZOLE (FLAGYL) IVPB 500 mg (500 mg Intravenous New Bag/Given 01/13/21 2143)  vancomycin (VANCOREADY) IVPB 750 mg/150 mL (has no administration in time range)  ceFEPIme (MAXIPIME) 1 g in sodium chloride 0.9 % 100 mL IVPB (has no administration in time range)  insulin regular, human (MYXREDLIN) 100 units/ 100 mL infusion (7.5 Units/hr Intravenous New Bag/Given 01/13/21 2125)  lactated ringers infusion ( Intravenous New Bag/Given 01/13/21 2108)  dextrose 5 % in lactated ringers infusion (0 mLs Intravenous Hold 01/13/21 2218)  dextrose 50 % solution 0-50 mL (has no administration in time range)  potassium chloride 10 mEq in 100 mL IVPB (0 mEq Intravenous Stopped 01/13/21 2211)  aspirin EC tablet 81 mg (has no administration in time range)  atorvastatin (LIPITOR)  tablet 40 mg (has no administration in time range)  metoprolol succinate (TOPROL-XL) 24 hr tablet 50 mg (has no administration in time range)  donepezil (ARICEPT) tablet 10 mg (has no administration in time range)  mirtazapine (REMERON) tablet 15 mg (has no administration in time range)  risperiDONE (RISPERDAL) tablet 1 mg (has no administration in time range)  clopidogrel (PLAVIX) tablet 75 mg (has no administration in time range)  tamsulosin (FLOMAX) capsule 0.4 mg (has no administration in time range)  finasteride (PROSCAR) tablet 5 mg (has no administration in time range)  haloperidol lactate (HALDOL) injection 5 mg (has no administration in time range)  heparin injection 5,000 Units (has no administration in time range)  furosemide (LASIX) injection 40 mg (has no administration in time range)  acetaminophen (TYLENOL) tablet 650 mg (has no administration in time range)    Or  acetaminophen (TYLENOL) suppository 650 mg (has no administration in time range)  oxyCODONE (Oxy IR/ROXICODONE) immediate release tablet 5 mg (has no administration in time range)  ondansetron (ZOFRAN) tablet 4 mg (has no administration in time range)    Or  ondansetron (ZOFRAN) injection 4 mg (has no administration in time range)  nicotine (NICODERM CQ - dosed in mg/24 hours) patch 21 mg (has no administration in time range)  albuterol (VENTOLIN HFA) 108 (90 Base) MCG/ACT inhaler 2 puff (2 puffs Inhalation Given 01/13/21 1920)  ipratropium-albuterol (DUONEB) 0.5-2.5 (3) MG/3ML nebulizer solution 3 mL (3 mLs Nebulization Given 01/13/21 2004)  sodium chloride 0.9 % bolus 500 mL (500 mLs Intravenous New Bag/Given 01/13/21 2109)     Note:  This document was prepared using Dragon voice recognition software and may include unintentional dictation errors.  Nanda Quinton, MD, [facility] Emergency Medicine    Ellwood Steidle, [facility], MD 01/13/21 [phone]

## 2021-01-13 NOTE — ED Triage Notes (Signed)
C/o shortness of breath onset today, patient was previously on oxygen, per spouse still smokes

## 2021-01-13 NOTE — Progress Notes (Addendum)
Pharmacy Antibiotic Note  Allen Yu is a 75 y.o. male admitted on 01/13/2021 with  infection of unknown source at this time .  Pharmacy has been consulted for cefepime and vancomycin dosing.  Plan: Cefepime 1 gram iv q12h Vanco 750 mg iv q48h     Vanco kinetics:           Scr used 2.21 mg/dL           Ke: 0.023 hr-1           T 1/2: 30.7 hours           Vd: 38.5 Liters           AUC (est): 432 mcg*hr/mL  Height: 6\' 1"  (185.4 cm) Weight: 53.5 kg (117 lb 15.1 oz) IBW/kg (Calculated) : 79.9  Temp (24hrs), Avg:97.1 F (36.2 C), Min:97.1 F (36.2 C), Max:97.1 F (36.2 C)  Recent Labs  Lab 01/13/21 1905  WBC 7.4  CREATININE 2.21*  LATICACIDVEN 6.1*    Estimated Creatinine Clearance: 21.9 mL/min (A) (by C-G formula based on SCr of 2.21 mg/dL (H)).    Allergies  Allergen Reactions   Bee Venom Anaphylaxis   Codeine Other (See Comments)    incoherent  Other reaction(s): Delirium   Propoxyphene Other (See Comments)    Dizziness, "Makes me feel drunk" Other reaction(s): Dizziness   Valsartan Other (See Comments)    incoherent Other reaction(s): Delirium    Antimicrobials this admission: 12/23 Vanco >> 12/29 12/23 cefepime >> 12/29 12/23 Flagyl >>  Dose adjustments this admission: Cefepime was adjusted for renal function Normal renal dose is 2 grams iv q8h Adjusted for CRCL 22 ml/min - cefepime 1 gram iv q12h  Microbiology results: 12/23 BCx: in progress 12/23 UCx: in progress    Thank you for allowing pharmacy to be a part of this patients care.  Vaughan Basta BS, PharmD, BCPS Clinical Pharmacist 01/13/2021 8:33 PM

## 2021-01-13 NOTE — ED Notes (Signed)
Date and time results received: 01/13/21 2008   Test: LACTIC Critical Value: 6.1  Name of Provider Notified: Long, MD

## 2021-01-14 ENCOUNTER — Other Ambulatory Visit: Payer: Self-pay

## 2021-01-14 ENCOUNTER — Inpatient Hospital Stay (HOSPITAL_COMMUNITY): Payer: Medicare Other

## 2021-01-14 ENCOUNTER — Encounter (HOSPITAL_COMMUNITY): Payer: Self-pay | Admitting: Family Medicine

## 2021-01-14 DIAGNOSIS — N179 Acute kidney failure, unspecified: Secondary | ICD-10-CM | POA: Diagnosis not present

## 2021-01-14 DIAGNOSIS — E1159 Type 2 diabetes mellitus with other circulatory complications: Secondary | ICD-10-CM

## 2021-01-14 DIAGNOSIS — N189 Chronic kidney disease, unspecified: Secondary | ICD-10-CM

## 2021-01-14 DIAGNOSIS — J9601 Acute respiratory failure with hypoxia: Secondary | ICD-10-CM

## 2021-01-14 LAB — BASIC METABOLIC PANEL
Anion gap: 14 (ref 5–15)
Anion gap: 12 (ref 5–15)
BUN: 48 mg/dL — ABNORMAL HIGH (ref 8–23)
BUN: 53 mg/dL — ABNORMAL HIGH (ref 8–23)
CO2: 19 mmol/L — ABNORMAL LOW (ref 22–32)
CO2: 24 mmol/L (ref 22–32)
Calcium: 8.2 mg/dL — ABNORMAL LOW (ref 8.9–10.3)
Calcium: 8.5 mg/dL — ABNORMAL LOW (ref 8.9–10.3)
Chloride: 106 mmol/L (ref 98–111)
Chloride: 103 mmol/L (ref 98–111)
Creatinine, Ser: 2.24 mg/dL — ABNORMAL HIGH (ref 0.61–1.24)
Creatinine, Ser: 2.24 mg/dL — ABNORMAL HIGH (ref 0.61–1.24)
GFR, Estimated: 30 mL/min — ABNORMAL LOW (ref >60)
GFR, Estimated: 30 mL/min — ABNORMAL LOW (ref >60)
Glucose, Bld: 82 mg/dL (ref 70–99)
Glucose, Bld: 98 mg/dL (ref 70–99)
Potassium: 3.7 mmol/L (ref 3.5–5.1)
Potassium: 3.7 mmol/L (ref 3.5–5.1)
Sodium: 139 mmol/L (ref 135–145)
Sodium: 139 mmol/L (ref 135–145)

## 2021-01-14 LAB — CBC WITH DIFFERENTIAL/PLATELET
Abs Immature Granulocytes: 0.04 10*3/uL (ref 0.00–0.07)
Basophils Absolute: 0 10*3/uL (ref 0.0–0.1)
Basophils Relative: 0 %
Eosinophils Absolute: 0 10*3/uL (ref 0.0–0.5)
Eosinophils Relative: 0 %
HCT: 24.7 % — ABNORMAL LOW (ref 39.0–52.0)
Hemoglobin: 7.8 g/dL — ABNORMAL LOW (ref 13.0–17.0)
Immature Granulocytes: 1 %
Lymphocytes Relative: 15 %
Lymphs Abs: 1 10*3/uL (ref 0.7–4.0)
MCH: 29.8 pg (ref 26.0–34.0)
MCHC: 31.6 g/dL (ref 30.0–36.0)
MCV: 94.3 fL (ref 80.0–100.0)
Monocytes Absolute: 0.5 10*3/uL (ref 0.1–1.0)
Monocytes Relative: 7 %
Neutro Abs: 5.2 10*3/uL (ref 1.7–7.7)
Neutrophils Relative %: 77 %
Platelets: 297 10*3/uL (ref 150–400)
RBC: 2.62 MIL/uL — ABNORMAL LOW (ref 4.22–5.81)
RDW: 15.9 % — ABNORMAL HIGH (ref 11.5–15.5)
WBC: 6.7 10*3/uL (ref 4.0–10.5)
nRBC: 0 % (ref 0.0–0.2)

## 2021-01-14 LAB — COMPREHENSIVE METABOLIC PANEL
ALT: 93 U/L — ABNORMAL HIGH (ref 0–44)
AST: 86 U/L — ABNORMAL HIGH (ref 15–41)
Albumin: 3.1 g/dL — ABNORMAL LOW (ref 3.5–5.0)
Alkaline Phosphatase: 193 U/L — ABNORMAL HIGH (ref 38–126)
Anion gap: 13 (ref 5–15)
BUN: 50 mg/dL — ABNORMAL HIGH (ref 8–23)
CO2: 23 mmol/L (ref 22–32)
Calcium: 8.5 mg/dL — ABNORMAL LOW (ref 8.9–10.3)
Chloride: 104 mmol/L (ref 98–111)
Creatinine, Ser: 2.18 mg/dL — ABNORMAL HIGH (ref 0.61–1.24)
GFR, Estimated: 31 mL/min — ABNORMAL LOW (ref >60)
Glucose, Bld: 124 mg/dL — ABNORMAL HIGH (ref 70–99)
Potassium: 3.7 mmol/L (ref 3.5–5.1)
Sodium: 140 mmol/L (ref 135–145)
Total Bilirubin: 0.8 mg/dL (ref 0.3–1.2)
Total Protein: 6.4 g/dL — ABNORMAL LOW (ref 6.5–8.1)

## 2021-01-14 LAB — HEMOGLOBIN A1C
Hgb A1c MFr Bld: 8.8 % — ABNORMAL HIGH (ref 4.8–5.6)
Mean Plasma Glucose: 205.86 mg/dL

## 2021-01-14 LAB — MRSA NEXT GEN BY PCR, NASAL: MRSA by PCR Next Gen: NOT DETECTED

## 2021-01-14 LAB — ECHOCARDIOGRAM COMPLETE
Area-P 1/2: 2.91 cm2
Height: 72.992 in
MV M vel: 4.23 m/s
MV Peak grad: 71.6 mmHg
Radius: 0.8 cm
S' Lateral: 4.4 cm
Weight: 1925.94 oz

## 2021-01-14 LAB — GLUCOSE, CAPILLARY
Glucose-Capillary: 104 mg/dL — ABNORMAL HIGH (ref 70–99)
Glucose-Capillary: 106 mg/dL — ABNORMAL HIGH (ref 70–99)
Glucose-Capillary: 108 mg/dL — ABNORMAL HIGH (ref 70–99)
Glucose-Capillary: 83 mg/dL (ref 70–99)
Glucose-Capillary: 87 mg/dL (ref 70–99)
Glucose-Capillary: 92 mg/dL (ref 70–99)
Glucose-Capillary: 94 mg/dL (ref 70–99)
Glucose-Capillary: 98 mg/dL (ref 70–99)

## 2021-01-14 LAB — BETA-HYDROXYBUTYRIC ACID: Beta-Hydroxybutyric Acid: 0.12 mmol/L (ref 0.05–0.27)

## 2021-01-14 LAB — LACTIC ACID, PLASMA
Lactic Acid, Venous: 4.6 mmol/L (ref 0.5–1.9)
Lactic Acid, Venous: 2.1 mmol/L (ref 0.5–1.9)

## 2021-01-14 LAB — BRAIN NATRIURETIC PEPTIDE: B Natriuretic Peptide: 2752 pg/mL — ABNORMAL HIGH (ref 0.0–100.0)

## 2021-01-14 LAB — TSH: TSH: 2.646 u[IU]/mL (ref 0.350–4.500)

## 2021-01-14 LAB — MAGNESIUM: Magnesium: 1.6 mg/dL — ABNORMAL LOW (ref 1.7–2.4)

## 2021-01-14 MED ORDER — CHLORHEXIDINE GLUCONATE CLOTH 2 % EX PADS
6.0000 | MEDICATED_PAD | Freq: Every day | CUTANEOUS | Status: DC
  Administered 2021-01-14 – 2021-01-18 (×5): 6 via TOPICAL

## 2021-01-14 MED ORDER — INSULIN ASPART 100 UNIT/ML IJ SOLN
0.0000 [IU] | Freq: Three times a day (TID) | INTRAMUSCULAR | Status: DC
  Administered 2021-01-15 – 2021-01-16 (×2): 1 [IU] via SUBCUTANEOUS
  Administered 2021-01-16: 13:00:00 3 [IU] via SUBCUTANEOUS
  Administered 2021-01-16: 08:00:00 5 [IU] via SUBCUTANEOUS
  Administered 2021-01-17: 12:00:00 1 [IU] via SUBCUTANEOUS
  Administered 2021-01-17 – 2021-01-18 (×3): 3 [IU] via SUBCUTANEOUS
  Administered 2021-01-18: 12:00:00 2 [IU] via SUBCUTANEOUS
  Administered 2021-01-18: 17:00:00 7 [IU] via SUBCUTANEOUS
  Administered 2021-01-19 (×2): 5 [IU] via SUBCUTANEOUS
  Administered 2021-01-19: 17:00:00 1 [IU] via SUBCUTANEOUS

## 2021-01-14 MED ORDER — INSULIN ASPART 100 UNIT/ML IJ SOLN: 0.0000 [IU] | Freq: Every day | INTRAMUSCULAR | Status: DC

## 2021-01-14 MED ORDER — INSULIN DETEMIR 100 UNIT/ML ~~LOC~~ SOLN
30.0000 [IU] | Freq: Every day | SUBCUTANEOUS | Status: DC
  Administered 2021-01-14: 22:00:00 30 [IU] via SUBCUTANEOUS
  Filled 2021-01-14 (×2): qty 0.3

## 2021-01-14 MED ORDER — FUROSEMIDE 10 MG/ML IJ SOLN
80.0000 mg | Freq: Two times a day (BID) | INTRAMUSCULAR | Status: DC
  Administered 2021-01-14 – 2021-01-19 (×10): 80 mg via INTRAVENOUS
  Filled 2021-01-14 (×10): qty 8

## 2021-01-14 MED ORDER — INSULIN ASPART 100 UNIT/ML IJ SOLN
0.0000 [IU] | Freq: Every day | INTRAMUSCULAR | Status: DC
  Administered 2021-01-15: 22:00:00 2 [IU] via SUBCUTANEOUS
  Administered 2021-01-17: 22:00:00 3 [IU] via SUBCUTANEOUS
  Administered 2021-01-18: 23:00:00 5 [IU] via SUBCUTANEOUS

## 2021-01-14 MED ORDER — INSULIN ASPART 100 UNIT/ML IJ SOLN
0.0000 [IU] | INTRAMUSCULAR | Status: DC
Start: 2021-01-14 — End: 2021-01-14

## 2021-01-14 NOTE — Progress Notes (Signed)
PROGRESS NOTE    Allen Yu  XFG:[id number] DOB: March 18, 1945 DOA: 01/13/2021 PCP: [name], MD    Brief Narrative:  75 year old male with a history of dementia, PTSD, paroxysmal atrial fibrillation, diabetes, hypertension, systolic dysfunction who is not on chronic diuretics.  He was brought to the hospital with progressive shortness of breath and acute respiratory failure.  Noted to be in decompensated CHF.  Repeat echocardiogram shows drop in ejection fraction of 25 to 30% with global hypokinesis.  He is noted to have elevated LFTs and acute kidney injury, likely related to cardiorenal/low output.  He has been started on intravenous diuretics.   Assessment & Plan:   Principal Problem:   Acute respiratory failure with hypoxia (HCC) Active Problems:   Type 2 diabetes mellitus with other circulatory complications (HCC)   Acute kidney injury superimposed on chronic kidney disease (HCC)   S/P AKA (above knee amputation) unilateral, left (HCC)   Unspecified atrial fibrillation (HCC)   Indwelling Foley catheter present   Peripheral vascular disease (HCC)   Benign prostatic hyperplasia with urinary obstruction   Acute respiratory failure with hypoxia -Initial oxygen saturations noted to be as low as 75% on room air -Currently on 3 L of oxygen -Related to decompensated CHF -Initial concern for pneumonia, patient does not have any fever, leukocytosis or significant cough -We will discontinue further antibiotics -Wean off oxygen as tolerated  Acute on chronic systolic congestive heart failure -On echocardiogram from October 2022, EF noted to be 45% -Echocardiogram repeated during this admission shows EF of 25 to 30% with global hypokinesis -Suspect that lactic acidosis related to low flow from CHF -Overall prognosis seems to be poor -Discussed treatment options with patient's wife including more advanced/invasive therapy including inotropic agents -His wife feels that she would  not want to pursue such aggressive measures for him and would like to keep him at [facility] with conservative measures -He received a dose of intravenous Lasix this morning and only had about 350 cc out -We will increase Lasix to 80 mg IV twice daily -Continue on beta-blockers, blood pressures currently stable -Overall lactate has started to trend down  Acute kidney injury -Related to cardiorenal syndrome and low flow -Monitor with diuretics  Elevated LFTs -Likely related to hepatic congestion from decreased cardiac output -Continue to monitor with diuresis  Hyperglycemia in the setting of type 2 diabetes -Initially started on insulin infusion for possible DKA -It was felt that anion gap elevation likely related more lactic acidosis than ketoacidosis -Patient has since been transitioned to subcutaneous insulin -A1c 8.8  Paroxysmal atrial fibrillation -Currently on metoprolol with stable heart rate -He is not on chronic anticoagulation  Anemia likely related to chronic renal disease -No signs of bleeding -Continue to follow  BPH with chronic indwelling Foley catheter.  PTSD/dementia -Continue on Resporal -He has Haldol as needed since he has become agitated in the hospital in the past  DVT prophylaxis: heparin injection 5,000 Units Start: 01/13/21 2230 SCDs Start: 01/13/21 2219  Code Status: DNR Family Communication: Discussed with wife at the bedside Disposition Plan: Status is: Inpatient  Remains inpatient appropriate because: Needs continued IV diuresis         Consultants:    Procedures:  Echocardiogram  Antimicrobials:      Subjective: Denies any shortness of breath  Objective: Vitals:   01/14/21 1600 01/14/21 1616 01/14/21 1700 01/14/21 1800  BP: 131/75  131/83 106/65  Pulse: (!) 41  75 (!) 37  Resp: 13  19 17  Temp:  98.1 F (36.7 C)    TempSrc:  Oral    SpO2: 92%  96% 100%  Weight:      Height:        Intake/Output Summary  (Last 24 hours) at 01/14/2021 1922 Last data filed at 01/14/2021 1511 Gross per 24 hour  Intake 2270.9 ml  Output 657 ml  Net 1613.9 ml   Filed Weights   01/13/21 2025 01/13/21 2315  Weight: 53.5 kg 54.6 kg    Examination:  General exam: Appears calm and comfortable  Respiratory system: Crackles at bases.  Respiratory effort normal. Cardiovascular system: S1 & S2 heard, RRR. No JVD, murmurs, rubs, gallops or clicks.  1-2+ pedal edema Gastrointestinal system: Abdomen is nondistended, soft and nontender. No organomegaly or masses felt. Normal bowel sounds heard. Central nervous system: No focal neurological deficits. Extremities: Left AKA Skin: No rashes, lesions or ulcers Psychiatry: Does not engage in conversation, confused at times    Data Reviewed: I have personally reviewed following labs and imaging studies  CBC: Recent Labs  Lab 01/13/21 1905 01/14/21 0447  WBC 7.4 6.7  NEUTROABS 6.1 5.2  HGB 8.8* 7.8*  HCT 26.6* 24.7*  MCV 94.3 94.3  PLT 353 161   Basic Metabolic Panel: Recent Labs  Lab 01/13/21 1905 01/14/21 0107 01/14/21 0447 01/14/21 0909  NA 136 139 140 139  K 3.8 3.7 3.7 3.7  CL 97* 106 104 103  CO2 21* 19* 23 24  GLUCOSE 319* 82 124* 98  BUN 48* 48* 50* 53*  CREATININE 2.21* 2.24* 2.18* 2.24*  CALCIUM 8.9 8.2* 8.5* 8.5*  MG  --   --  1.6*  --    GFR: Estimated Creatinine Clearance: 22 mL/min (A) (by C-G formula based on SCr of 2.24 mg/dL (H)). Liver Function Tests: Recent Labs  Lab 01/13/21 1905 01/14/21 0447  AST 83* 86*  ALT 98* 93*  ALKPHOS 223* 193*  BILITOT 1.1 0.8  PROT 7.4 6.4*  ALBUMIN 3.6 3.1*   No results for input(s): LIPASE, AMYLASE in the last 168 hours. No results for input(s): AMMONIA in the last 168 hours. Coagulation Profile: No results for input(s): INR, PROTIME in the last 168 hours. Cardiac Enzymes: No results for input(s): CKTOTAL, CKMB, CKMBINDEX, TROPONINI in the last 168 hours. BNP (last 3 results) No  results for input(s): PROBNP in the last 8760 hours. HbA1C: Recent Labs    01/14/21 0447  HGBA1C 8.8*   CBG: Recent Labs  Lab 01/14/21 0256 01/14/21 0438 01/14/21 0855 01/14/21 1211 01/14/21 1614  GLUCAP 98 104* 87 92 94   Lipid Profile: No results for input(s): CHOL, HDL, LDLCALC, TRIG, CHOLHDL, LDLDIRECT in the last 72 hours. Thyroid Function Tests: Recent Labs    01/14/21 0447  TSH 2.646   Anemia Panel: No results for input(s): VITAMINB12, FOLATE, FERRITIN, TIBC, IRON, RETICCTPCT in the last 72 hours. Sepsis Labs: Recent Labs  Lab 01/13/21 2034 01/13/21 2231 01/14/21 0107 01/14/21 1527  LATICACIDVEN 6.3* 5.6* 4.6* 2.1*    Recent Results (from the past 240 hour(s))  Resp Panel by RT-PCR (Flu A&B, Covid) Nasopharyngeal Swab     Status: None   Collection Time: 01/13/21  7:15 PM   Specimen: Nasopharyngeal Swab; Nasopharyngeal(NP) swabs in vial transport medium  Result Value Ref Range Status   SARS Coronavirus 2 by RT PCR NEGATIVE NEGATIVE Final    Comment: (NOTE) SARS-CoV-2 target nucleic acids are NOT DETECTED.  The SARS-CoV-2 RNA is generally detectable in upper  respiratory specimens during the acute phase of infection. The lowest concentration of SARS-CoV-2 viral copies this assay can detect is 138 copies/mL. A negative result does not preclude SARS-Cov-2 infection and should not be used as the sole basis for treatment or other patient management decisions. A negative result may occur with  improper specimen collection/handling, submission of specimen other than nasopharyngeal swab, presence of viral mutation(s) within the areas targeted by this assay, and inadequate number of viral copies(<138 copies/mL). A negative result must be combined with clinical observations, patient history, and epidemiological information. The expected result is Negative.  Fact Sheet for Patients:  EntrepreneurPulse.com.au  Fact Sheet for Healthcare  Providers:  IncredibleEmployment.be  This test is no t yet approved or cleared by the [place] FDA and  has been authorized for detection and/or diagnosis of SARS-CoV-2 by FDA under an Emergency Use Authorization (EUA). This EUA will remain  in effect (meaning this test can be used) for the duration of the COVID-19 declaration under Section 564(b)(1) of the Act, 21 U.S.C.section 360bbb-3(b)(1), unless the authorization is terminated  or revoked sooner.       Influenza A by PCR NEGATIVE NEGATIVE Final   Influenza B by PCR NEGATIVE NEGATIVE Final    Comment: (NOTE) The Xpert Xpress SARS-CoV-2/FLU/RSV plus assay is intended as an aid in the diagnosis of influenza from Nasopharyngeal swab specimens and should not be used as a sole basis for treatment. Nasal washings and aspirates are unacceptable for Xpert Xpress SARS-CoV-2/FLU/RSV testing.  Fact Sheet for Patients: EntrepreneurPulse.com.au  Fact Sheet for Healthcare Providers: IncredibleEmployment.be  This test is not yet approved or cleared by the [place] FDA and has been authorized for detection and/or diagnosis of SARS-CoV-2 by FDA under an Emergency Use Authorization (EUA). This EUA will remain in effect (meaning this test can be used) for the duration of the COVID-19 declaration under Section 564(b)(1) of the Act, 21 U.S.C. section 360bbb-3(b)(1), unless the authorization is terminated or revoked.  Performed at [facility], [address] [place], [place] [address]   Culture, blood (routine x 2)     Status: None (Preliminary result)   Collection Time: 01/13/21  8:25 PM   Specimen: Right Antecubital; Blood  Result Value Ref Range Status   Specimen Description   Final    RIGHT ANTECUBITAL BOTTLES DRAWN AEROBIC AND ANAEROBIC   Special Requests Blood Culture adequate volume  Final   Culture   Final    NO GROWTH < 12 HOURS Performed at [facility],  [address], [place], [place] [address]    Report Status PENDING  Incomplete  Culture, blood (routine x 2)     Status: None (Preliminary result)   Collection Time: 01/13/21  8:33 PM   Specimen: BLOOD RIGHT ARM  Result Value Ref Range Status   Specimen Description BLOOD RIGHT ARM  Final   Special Requests   Final    BOTTLES DRAWN AEROBIC ONLY Blood Culture results may not be optimal due to an inadequate volume of blood received in culture bottles   Culture   Final    NO GROWTH < 12 HOURS Performed at [facility], [address], [place],  [address]    Report Status PENDING  Incomplete  MRSA Next Gen by PCR, Nasal     Status: None   Collection Time: 01/14/21  2:24 PM   Specimen: Nasal Mucosa; Nasal Swab  Result Value Ref Range Status   MRSA by PCR Next Gen NOT DETECTED NOT DETECTED Final  Comment: (NOTE) The GeneXpert MRSA Assay (FDA approved for NASAL specimens only), is one component of a comprehensive MRSA colonization surveillance program. It is not intended to diagnose MRSA infection nor to guide or monitor treatment for MRSA infections. Test performance is not FDA approved in patients less than 35 years old. Performed at [facility], [address] [place], [place] [address]          Radiology Studies: DG Chest Portable 1 View  Result Date: 01/13/2021 CLINICAL DATA:  Shortness of breath. EXAM: PORTABLE CHEST 1 VIEW COMPARISON:  September 27, 2020 FINDINGS: Stable moderate to marked severity diffuse chronic appearing increased interstitial lung markings are seen. Mild bilateral lower lobe infiltrates are seen, left greater than right. There is a small left pleural effusion. No pneumothorax is identified. The cardiac silhouette is enlarged and unchanged in size. There is moderate severity calcification of the aortic arch. A radiopaque fusion plate and screws are seen overlying the cervical spine. IMPRESSION: 1. Stable moderate to marked severity diffuse chronic appearing  increased interstitial lung markings with mild bilateral lower lobe infiltrates, left greater than right. 2. Small left pleural effusion. Electronically Signed   By: Virgina Norfolk M.D.   On: 01/13/2021 19:10   ECHOCARDIOGRAM COMPLETE  Result Date: 01/14/2021    ECHOCARDIOGRAM REPORT   Patient Name:   Allen Yu Date of Exam: 01/14/2021 Medical Rec #:  [id number]      Height:       73.0 in Accession #:    [id number]     Weight:       120.4 lb Date of Birth:  1946/01/10     BSA:          1.734 m Patient Age:    61 years       BP:           116/90 mmHg Patient Gender: M              HR:           77 bpm. Exam Location:  [facility] Procedure: 2D Echo, Cardiac Doppler and Color Doppler Indications:    Congestive Heart Failure I50.9  History:        Patient has prior history of Echocardiogram examinations, most                 recent 09/27/2020. CHF, COPD, Arrythmias:Atrial Fibrillation; Risk                 Factors:Diabetes and Hypertension. PTSD, dementia,                 schizophrenia.  Sonographer:    Alvino Chapel RCS Referring Phys: [id number] ASIA B Makoti  Sonographer Comments: Patient confused/combative at times. IMPRESSIONS  1. Left ventricular ejection fraction, by estimation, is 25 to 30%. The left ventricle has severely decreased function. The left ventricle demonstrates global hypokinesis with some regional variation. There is moderate left ventricular hypertrophy. Left  ventricular diastolic parameters are indeterminate.  2. Right ventricular systolic function is mildly reduced. The right ventricular size is normal. There is severely elevated pulmonary artery systolic pressure. The estimated right ventricular systolic pressure is 76.5 mmHg.  3. Left atrial size was moderately dilated.  4. Right atrial size was moderately dilated.  5. There is a trivial pericardial effusion posterior to the left ventricle and localized near the right atrium.  6. The mitral valve is degenerative. Moderate mitral  valve regurgitation.  7. Tricuspid valve regurgitation is moderate.  8. The aortic valve is tricuspid. There is mild calcification of the aortic valve. Aortic valve regurgitation is not visualized.  9. The inferior vena cava is dilated in size with <50% respiratory variability, suggesting right atrial pressure of 15 mmHg. Comparison(s): Prior images reviewed side by side. LVEF has decreased further and estimated PASP is higher. FINDINGS  Left Ventricle: Left ventricular ejection fraction, by estimation, is 25 to 30%. The left ventricle has severely decreased function. The left ventricle demonstrates global hypokinesis. The left ventricular internal cavity size was normal in size. There is moderate left ventricular hypertrophy. Left ventricular diastolic parameters are indeterminate. Right Ventricle: The right ventricular size is normal. No increase in right ventricular wall thickness. Right ventricular systolic function is mildly reduced. There is severely elevated pulmonary artery systolic pressure. The tricuspid regurgitant velocity is 3.56 m/s, and with an assumed right atrial pressure of 15 mmHg, the estimated right ventricular systolic pressure is 29.7 mmHg. Left Atrium: Left atrial size was moderately dilated. Right Atrium: Right atrial size was moderately dilated. Pericardium: Trivial pericardial effusion is present. The pericardial effusion is posterior to the left ventricle and localized near the right atrium. Mitral Valve: The mitral valve is degenerative in appearance. There is mild thickening of the mitral valve leaflet(s). Moderate mitral valve regurgitation. Tricuspid Valve: The tricuspid valve is grossly normal. Tricuspid valve regurgitation is moderate. Aortic Valve: The aortic valve is tricuspid. There is mild calcification of the aortic valve. There is mild aortic valve annular calcification. Aortic valve regurgitation is not visualized. Pulmonic Valve: The pulmonic valve was grossly normal.  Pulmonic valve regurgitation is trivial. Aorta: The aortic root is normal in size and structure. Venous: The inferior vena cava is dilated in size with less than 50% respiratory variability, suggesting right atrial pressure of 15 mmHg. IAS/Shunts: No atrial level shunt detected by color flow Doppler. Additional Comments: There is pleural effusion in the left lateral region.  LEFT VENTRICLE PLAX 2D LVIDd:         5.20 cm LVIDs:         4.40 cm LV PW:         1.50 cm LV IVS:        1.30 cm LVOT diam:     1.90 cm LV SV:         27 LV SV Index:   15 LVOT Area:     2.84 cm  RIGHT VENTRICLE TAPSE (M-mode): 1.2 cm LEFT ATRIUM             Index        RIGHT ATRIUM           Index LA diam:        3.90 cm 2.25 cm/m   RA Area:     23.70 cm LA Vol (A2C):   82.1 ml 47.35 ml/m  RA Volume:   79.70 ml  45.96 ml/m LA Vol (A4C):   83.5 ml 48.15 ml/m LA Biplane Vol: 85.9 ml 49.54 ml/m  AORTIC VALVE LVOT Vmax:   54.00 cm/s LVOT Vmean:  34.800 cm/s LVOT VTI:    0.094 m  AORTA Ao Root diam: 3.80 cm MITRAL VALVE                  TRICUSPID VALVE MV Area (PHT): 2.91 cm       TR Peak grad:   50.7 mmHg MV Decel Time: 261 msec       TR Vmax:        356.00  cm/s MR Peak grad:    71.6 mmHg MR Mean grad:    51.0 mmHg    SHUNTS MR Vmax:         423.00 cm/s  Systemic VTI:  0.09 m MR Vmean:        334.0 cm/s   Systemic Diam: 1.90 cm MR PISA:         4.02 cm MR PISA Eff ROA: 30 mm MR PISA Radius:  0.80 cm MV E velocity: 88.10 cm/s Rozann Lesches MD Electronically signed by Rozann Lesches MD Signature Date/Time: 01/14/2021/2:11:21 PM    Final         Scheduled Meds:  aspirin EC  81 mg Oral Daily   atorvastatin  40 mg Oral QHS   Chlorhexidine Gluconate Cloth  6 each Topical Daily   clopidogrel  75 mg Oral Daily   donepezil  10 mg Oral QHS   finasteride  5 mg Oral Daily   furosemide  80 mg Intravenous BID   heparin  5,000 Units Subcutaneous Q8H   insulin aspart  0-5 Units Subcutaneous QHS   insulin aspart  0-9 Units  Subcutaneous TID WC   insulin detemir  30 Units Subcutaneous QHS   metoprolol succinate  50 mg Oral Daily   mirtazapine  15 mg Oral QHS   nicotine  21 mg Transdermal Daily   risperiDONE  1 mg Oral BID   tamsulosin  0.4 mg Oral Daily   Continuous Infusions:   LOS: 1 day    Time spent: 35 minutes    Kathie Dike, MD [facility]   If 7PM-7AM, please contact night-coverage [email or web address]  01/14/2021, 7:22 PM

## 2021-01-14 NOTE — H&P (Signed)
TRH H&P    Patient Demographics:    Allen Yu, is a 74 y.o. male  MRN: [id number]  DOB - 07-01-1945  Admit Date - 01/13/2021  Referring MD/NP/PA: Long  Outpatient Primary MD for the patient is [name], MD  Patient coming from: Home  Chief complaint- dyspnea   HPI:    Allen Yu  is a 75 y.o. male, with history of anemia, CKD, dementia, schizophrenia, DMII, HTN, PAF, PVD, and more presents to the ED with a chief complaint of dyspnea. Wife provides the history. She reports that she could tell he was feeling short of breath because he wasn't speaking in full sentences. She reports that he has had his normal chronic cough. He does not have an O2 requirement at baseline. He was hypoxic down to 75% percent at presentation. He normalized on 3 L [place].  Wife reports that the dyspnea started this morning.  He has not had any complaints.  She reports that he did have a fall a few days ago between his wheelchair and his bed where he scraped his back.  That is been the only other thing out of the norm for him.  She reports his mentation is at his baseline.  He has had no nausea or vomiting.  He has had a decrease in appetite.  She also reports that patient drinks 2 to 3 gallons of water per day.  He has been drinking this much water for the last few months.  His glucose fluctuates per her report.  He is noncompliant with his insulin, but supposed to take 30 units of insulin per night.  He also takes 2 oral hypoglycemics.  He had no fevers or chills.  He has not had any slurred speech or stuttering.  Patient is a current smoker he smokes a pack a day.  He does not drink alcohol.  He does use marijuana with the last use yesterday.  He is vaccinated for COVID.  Patient is DNR.  In the ED Temp 97.1, heart rate 30-80, respiratory rate 24, blood pressure 145/102, satting at 97% on 3 L nasal cannula pH 7.4, PCO2 39.7 Lactic acid  initially over 6, down to 4.6 White blood cell count is normal at 7.4, hemoglobin 8.8 Chemistry panel reveals a creatinine of 2.21 glucose of 319 elevated gap Patient's BNP is 2873 Initial troponin 63 downtrending to 56 Negative respiratory panel Blood cultures pending Chest x-ray shows interstitial lung markings with mild bilateral lower base infiltrates left greater than right Vancomycin, Flagyl and cefepime started Patient did have a anion gap so insulin drip was started as well  Patient was also given 20 mEq of potassium given that he was starting insulin drip Magda Paganini is given a 500 mm bolus   Review of systems:    In addition to the HPI above,  No Fever-chills, No Headache, No changes with Vision or hearing, No problems swallowing food or Liquids, No Chest pain,  No Abdominal pain, No Nausea or Vomiting, bowel movements are regular, No Blood in stool or Urine, No  dysuria, No new skin rashes or bruises, No new joints pains-aches,  No new weakness, tingling, numbness in any extremity, No recent weight gain or loss, No polyuria, polydypsia or polyphagia,   All other systems reviewed and are negative.    Past History of the following :    Past Medical History:  Diagnosis Date   Anemia    Arthritis    Carotid stenosis    Chronic back pain    Chronic kidney disease    Constipation    Dementia (HCC)    Diabetes mellitus    GERD (gastroesophageal reflux disease)    History of kidney stones    Hypertension    Lung nodule    PAF (paroxysmal atrial fibrillation) (HCC)    Peripheral neuropathy    Peripheral vascular disease (HCC)    PTSD (post-traumatic stress disorder)    Schizophrenia ([place])    Tuberculosis    Treated      Past Surgical History:  Procedure Laterality Date   ABDOMINAL AORTOGRAM W/LOWER EXTREMITY N/A 05/20/2020   Procedure: ABDOMINAL AORTOGRAM W/LOWER EXTREMITY;  Surgeon: Elam Dutch, MD;  Location: [place] [facility];  Service:  Cardiovascular;  Laterality: N/A;   ABDOMINAL AORTOGRAM W/LOWER EXTREMITY N/A 10/27/2020   Procedure: ABDOMINAL AORTOGRAM W/LOWER EXTREMITY;  Surgeon: Marty Heck, MD;  Location: [place] [facility];  Service: Cardiovascular;  Laterality: N/A;   AMPUTATION Left 06/28/2020   Procedure: AMPUTATION ABOVE KNEE LEFT;  Surgeon: Rosetta Posner, MD;  Location: [facility];  Service: Vascular;  Laterality: Left;   [place], 2013   x2   BLADDER SURGERY     02   CHOLECYSTECTOMY     COLONOSCOPY     COLONOSCOPY N/A 12/07/2014   Procedure: COLONOSCOPY;  Surgeon: Daneil Dolin, MD;  [facility];  Service: Endoscopy;  Laterality: N/A;  10:30 Am   EYE SURGERY Bilateral    removed metal from eye   FEMORAL-TIBIAL BYPASS GRAFT Left 05/27/2020   Procedure: LEFT FEMORAL TO PERONEAL ARTERY BYPASS;  Surgeon: Rosetta Posner, MD;  Location: [place];  Service: Vascular;  Laterality: Left;   HERNIA REPAIR Right    INGUINAL HERNIA REPAIR Left 11/03/2013   Procedure: HERNIA REPAIR INGUINAL ADULT;  Surgeon: Gayland Curry, MD;  Location: [place];  Service: General;  Laterality: Left;   PERIPHERAL VASCULAR INTERVENTION  10/27/2020   Procedure: PERIPHERAL VASCULAR INTERVENTION;  Surgeon: Marty Heck, MD;  Location: [place] [facility];  Service: Cardiovascular;;   SHOULDER SURGERY     RIGHT SHOULDER    VIDEO BRONCHOSCOPY WITH ENDOBRONCHIAL NAVIGATION N/A 07/10/2019   Procedure: VIDEO BRONCHOSCOPY WITH ENDOBRONCHIAL NAVIGATION;  Surgeon: Melrose Nakayama, MD;  Location: [place] [facility];  Service: Thoracic;  Laterality: N/A;      Social History:      Social History   Tobacco Use   Smoking status: Every Day    Packs/day: 0.50    Types: Cigarettes   Smokeless tobacco: Never   Tobacco comments:    burns them up  Substance Use Topics   Alcohol use: No    Alcohol/week: 0.0 standard drinks       Family History :     Family History  Problem Relation Age of Onset   Heart  disease Mother        before age 21      Home Medications:   Prior to Admission medications   Medication Sig  Start Date End Date Taking? Authorizing Provider  acetaminophen (TYLENOL) 500 MG tablet Take 1,000 mg by mouth every 6 (six) hours as needed for moderate pain.    [provider]  Alcohol Swabs (ALCOHOL PADS) 70 % PADS USE 1 PAD AS DIRECTED 10/12/20 10/13/21  [provider]  aspirin EC 81 MG tablet Take 1 tablet (81 mg total) by mouth daily. Swallow whole. 10/27/20 10/27/21  Marty Heck, MD  atorvastatin (LIPITOR) 40 MG tablet Take 40 mg by mouth at bedtime.    [provider]  B-D UF III MINI PEN NEEDLES 31G X 5 MM MISC SMARTSIG:1 Each SUB-Q Daily 12/23/19   [provider]  Bacitracin-Polymyxin B (NEOSPORIN EX) Apply 1 application topically daily as needed (wound care).    [provider]  cefdinir (OMNICEF) 300 MG capsule Take 300 mg by mouth every 12 (twelve) hours. 10/19/20   [provider]  cefpodoxime (VANTIN) 100 MG tablet Take 100 mg by mouth 2 (two) times daily.    [provider]  clopidogrel (PLAVIX) 75 MG tablet Take 1 tablet (75 mg total) by mouth daily. 10/27/20 10/27/21  Marty Heck, MD  donepezil (ARICEPT) 10 MG tablet Take 10 mg by mouth at bedtime.  06/28/16   [provider]  ergocalciferol (VITAMIN D2) 1.25 MG (50000 UT) capsule TAKE ONE CAPSULE BY MOUTH WEEKLY FOR 12 WEEKS FOR LOW VITAMIN D 10/04/20   [provider]  finasteride (PROSCAR) 5 MG tablet Take 5 mg by mouth daily. 08/23/20   [provider]  gabapentin (NEURONTIN) 100 MG capsule Take 100 mg by mouth at bedtime. 11/06/19   [provider]  LANTUS 100 UNIT/ML injection Inject 0.1 mLs (10 Units total) into the skin at bedtime as needed (High blood glucose). Patient taking differently: Inject 25 Units into the skin at bedtime. 06/30/20   Mercy Riding, MD  levofloxacin (LEVAQUIN) 500 MG tablet Take 1  tablet (500 mg total) by mouth daily. 09/29/20   Orson Eva, MD  metFORMIN (GLUCOPHAGE) 500 MG tablet Take by mouth 2 (two) times daily with a meal. Take 1 daily for 7 days then 2 times daily    [provider]  metFORMIN (GLUCOPHAGE) 500 MG tablet TAKE ONE TABLET BY MOUTH TWICE A DAY FOR DIABETES (ANNUAL KIDNEY FUNCTION TESTING IS NEEDED) 12/08/20   [provider]  metoprolol succinate (TOPROL-XL) 50 MG 24 hr tablet Take 1 tablet (50 mg total) by mouth daily. Take with or immediately following a meal. 11/29/20 02/27/21  Strader, Fransisco Hertz, PA-C  mirtazapine (REMERON) 15 MG tablet Take 15 mg by mouth at bedtime. 08/12/20   [provider]  mirtazapine (REMERON) 7.5 MG tablet Take 7.5 mg by mouth at bedtime. 09/09/20   [provider]  nicotine (NICODERM CQ - DOSED IN MG/24 HOURS) 21 mg/24hr patch Place 1 patch (21 mg total) onto the skin daily as needed (Cessation of smoking). Patient not taking: No sig reported 06/30/20   Mercy Riding, MD  risperiDONE (RISPERDAL) 0.5 MG tablet Take 1 tablet (0.5 mg total) by mouth 2 (two) times daily. 09/29/20   Orson Eva, MD  risperiDONE (RISPERDAL) 1 MG tablet TAKE ONE-HALF TABLET BY MOUTH TWICE A DAY AS NEEDED  FOR BEHAVIOR PROBLEMS 10/03/20   [provider]  tamsulosin (FLOMAX) 0.4 MG CAPS capsule Take 1 capsule (0.4 mg total) by mouth daily. 07/26/20   Orson Eva, MD  traMADol (ULTRAM) 50 MG tablet Take 50 mg by mouth  every 6 (six) hours as needed for moderate pain. 07/18/20   [provider]  triamcinolone ointment (KENALOG) 0.1 % Apply 1 application topically daily. Apply to foot    [provider]  Vitamin D, Ergocalciferol, (DRISDOL) 1.25 MG (50000 UNIT) CAPS capsule Take 50,000 Units by mouth every Friday.    [provider]     Allergies:     Allergies  Allergen Reactions   Bee Venom Anaphylaxis   Codeine Other (See Comments)    incoherent  Other reaction(s): Delirium   Propoxyphene  Other (See Comments)    Dizziness, "Makes me feel drunk" Other reaction(s): Dizziness   Valsartan Other (See Comments)    incoherent Other reaction(s): Delirium     Physical Exam:   Vitals  Blood pressure (!) 135/110, pulse 90, temperature (!) 97.3 F (36.3 C), temperature source Oral, resp. rate 18, height 6\' 1"  (1.854 m), weight 53.5 kg, SpO2 99 %.   1.  General: Patient lying supine in bed,  no acute distress   2. Psychiatric: Alert and intermittently responding to questions, oriented to self and place but not answering about time, mood is irritable, patient is agitated  3. Neurologic: Speech and language are normal, face is symmetric, moves all 4 extremities voluntarily, at baseline without acute deficits on limited exam   4. HEENMT:  Head is atraumatic, normocephalic, pupils reactive to light, neck is supple, trachea is midline, mucous membranes are moist   5. Respiratory : Lungs are clear to auscultation bilaterally without wheezing, rhonchi, rales, no cyanosis, no increase in work of breathing or accessory muscle use   6. Cardiovascular : Heart rate normal, rhythm is regular, no murmurs, rubs or gallops, peripheral pulses palpated   7. Gastrointestinal:  Abdomen is soft, nondistended, nontender to palpation bowel sounds active, no masses or organomegaly palpated   8. Skin:  Skin is warm, dry and intact without rashes, acute lesions, or ulcers on limited exam   9.Musculoskeletal:  No acute deformities or trauma, no asymmetry in tone, peripheral edema present right lower extremity, left BKA, peripheral pulses palpated, no tenderness to palpation in the extremities     Data Review:    CBC Recent Labs  Lab 01/13/21 1905  WBC 7.4  HGB 8.8*  HCT 26.6*  PLT 353  MCV 94.3  MCH 31.2  MCHC 33.1  RDW 15.9*  LYMPHSABS 0.9  MONOABS 0.3  EOSABS 0.0  BASOSABS 0.0    ------------------------------------------------------------------------------------------------------------------  Results for orders placed or performed during the hospital encounter of 01/13/21 (from the past 48 hour(s))  Comprehensive metabolic panel     Status: Abnormal   Collection Time: 01/13/21  7:05 PM  Result Value Ref Range   Sodium 136 135 - 145 mmol/L   Potassium 3.8 3.5 - 5.1 mmol/L   Chloride 97 (L) 98 - 111 mmol/L   CO2 21 (L) 22 - 32 mmol/L   Glucose, Bld 319 (H) 70 - 99 mg/dL    Comment: Glucose reference range applies only to samples taken after fasting for at least 8 hours.   BUN 48 (H) 8 - 23 mg/dL   Creatinine, Ser 2.21 (H) 0.61 - 1.24 mg/dL   Calcium 8.9 8.9 - 10.3 mg/dL   Total Protein 7.4 6.5 - 8.1 g/dL   Albumin 3.6 3.5 - 5.0 g/dL   AST 83 (H) 15 - 41 U/L   ALT 98 (H) 0 - 44 U/L   Alkaline Phosphatase 223 (H) 38 - 126 U/L   Total  Bilirubin 1.1 0.3 - 1.2 mg/dL   GFR, Estimated 30 (L) >60 mL/min    Comment: (NOTE) Calculated using the CKD-EPI Creatinine Equation (2021)    Anion gap 18 (H) 5 - 15    Comment: Performed at [facility], [address] [place], [place] [address]  Brain natriuretic peptide     Status: Abnormal   Collection Time: 01/13/21  7:05 PM  Result Value Ref Range   B Natriuretic Peptide 2,873.0 (H) 0.0 - 100.0 pg/mL    Comment: Performed at [facility], [address] [place], [place] [address]  Troponin I (High Sensitivity)     Status: Abnormal   Collection Time: 01/13/21  7:05 PM  Result Value Ref Range   Troponin I (High Sensitivity) 63 (H) <18 ng/L    Comment: (NOTE) Elevated high sensitivity troponin I (hsTnI) values and significant  changes across serial measurements may suggest ACS but many other  chronic and acute conditions are known to elevate hsTnI results.  Refer to the "Links" section for chest pain algorithms and additional  guidance. Performed at [facility], [address], [place], [place] [address]   CBC  with Differential     Status: Abnormal   Collection Time: 01/13/21  7:05 PM  Result Value Ref Range   WBC 7.4 4.0 - 10.5 K/uL   RBC 2.82 (L) 4.22 - 5.81 MIL/uL   Hemoglobin 8.8 (L) 13.0 - 17.0 g/dL   HCT 26.6 (L) 39.0 - 52.0 %   MCV 94.3 80.0 - 100.0 fL   MCH 31.2 26.0 - 34.0 pg   MCHC 33.1 30.0 - 36.0 g/dL   RDW 15.9 (H) 11.5 - 15.5 %   Platelets 353 150 - 400 K/uL   nRBC 0.3 (H) 0.0 - 0.2 %   Neutrophils Relative % 84 %   Neutro Abs 6.1 1.7 - 7.7 K/uL   Lymphocytes Relative 12 %   Lymphs Abs 0.9 0.7 - 4.0 K/uL   Monocytes Relative 4 %   Monocytes Absolute 0.3 0.1 - 1.0 K/uL   Eosinophils Relative 0 %   Eosinophils Absolute 0.0 0.0 - 0.5 K/uL   Basophils Relative 0 %   Basophils Absolute 0.0 0.0 - 0.1 K/uL   Immature Granulocytes 0 %   Abs Immature Granulocytes 0.02 0.00 - 0.07 K/uL    Comment: Performed at [facility], [address] [place], [place] [address]  Lactic acid, plasma     Status: Abnormal   Collection Time: 01/13/21  7:05 PM  Result Value Ref Range   Lactic Acid, Venous 6.1 (HH) 0.5 - 1.9 mmol/L    Comment: CRITICAL RESULT CALLED TO, READ BACK BY AND VERIFIED WITH: SAPPELT,J AT 2008 ON 12.23.22 BY RUCINSKI,B Performed at [facility], [address], [place], [place] [address]   Resp Panel by RT-PCR (Flu A&B, Covid) Nasopharyngeal Swab     Status: None   Collection Time: 01/13/21  7:15 PM   Specimen: Nasopharyngeal Swab; Nasopharyngeal(NP) swabs in vial transport medium  Result Value Ref Range   SARS Coronavirus 2 by RT PCR NEGATIVE NEGATIVE    Comment: (NOTE) SARS-CoV-2 target nucleic acids are NOT DETECTED.  The SARS-CoV-2 RNA is generally detectable in upper respiratory specimens during the acute phase of infection. The lowest concentration of SARS-CoV-2 viral copies this assay can detect is 138 copies/mL. A negative result does not preclude SARS-Cov-2 infection and should not be used as the sole basis for treatment or other patient management  decisions. A negative result  may occur with  improper specimen collection/handling, submission of specimen other than nasopharyngeal swab, presence of viral mutation(s) within the areas targeted by this assay, and inadequate number of viral copies(<138 copies/mL). A negative result must be combined with clinical observations, patient history, and epidemiological information. The expected result is Negative.  Fact Sheet for Patients:  EntrepreneurPulse.com.au  Fact Sheet for Healthcare Providers:  IncredibleEmployment.be  This test is no t yet approved or cleared by the [place] FDA and  has been authorized for detection and/or diagnosis of SARS-CoV-2 by FDA under an Emergency Use Authorization (EUA). This EUA will remain  in effect (meaning this test can be used) for the duration of the COVID-19 declaration under Section 564(b)(1) of the Act, 21 U.S.C.section 360bbb-3(b)(1), unless the authorization is terminated  or revoked sooner.       Influenza A by PCR NEGATIVE NEGATIVE   Influenza B by PCR NEGATIVE NEGATIVE    Comment: (NOTE) The Xpert Xpress SARS-CoV-2/FLU/RSV plus assay is intended as an aid in the diagnosis of influenza from Nasopharyngeal swab specimens and should not be used as a sole basis for treatment. Nasal washings and aspirates are unacceptable for Xpert Xpress SARS-CoV-2/FLU/RSV testing.  Fact Sheet for Patients: EntrepreneurPulse.com.au  Fact Sheet for Healthcare Providers: IncredibleEmployment.be  This test is not yet approved or cleared by the [place] FDA and has been authorized for detection and/or diagnosis of SARS-CoV-2 by FDA under an Emergency Use Authorization (EUA). This EUA will remain in effect (meaning this test can be used) for the duration of the COVID-19 declaration under Section 564(b)(1) of the Act, 21 U.S.C. section 360bbb-3(b)(1), unless the authorization  is terminated or revoked.  Performed at [facility], [address] [place], [place] [address]   Culture, blood (routine x 2)     Status: None (Preliminary result)   Collection Time: 01/13/21  8:25 PM   Specimen: Right Antecubital; Blood  Result Value Ref Range   Specimen Description      RIGHT ANTECUBITAL BOTTLES DRAWN AEROBIC AND ANAEROBIC   Special Requests      Blood Culture adequate volume Performed at [facility], [address], [place], [place] [address]    Culture PENDING    Report Status PENDING   Culture, blood (routine x 2)     Status: None (Preliminary result)   Collection Time: 01/13/21  8:33 PM   Specimen: BLOOD RIGHT ARM  Result Value Ref Range   Specimen Description BLOOD RIGHT ARM    Special Requests      BOTTLES DRAWN AEROBIC ONLY Blood Culture results may not be optimal due to an inadequate volume of blood received in culture bottles Performed at [facility], [address] [place], [place] [address]    Culture PENDING    Report Status PENDING   Lactic acid, plasma     Status: Abnormal   Collection Time: 01/13/21  8:34 PM  Result Value Ref Range   Lactic Acid, Venous 6.3 (HH) 0.5 - 1.9 mmol/L    Comment: CRITICAL VALUE NOTED.  VALUE IS CONSISTENT WITH PREVIOUSLY REPORTED AND CALLED VALUE. Performed at [facility], [address] [place], [place] [address]   Troponin I (High Sensitivity)     Status: Abnormal   Collection Time: 01/13/21  8:34 PM  Result Value Ref Range   Troponin I (High Sensitivity) 56 (H) <18 ng/L    Comment: (NOTE) Elevated high sensitivity troponin I (hsTnI) values and significant  changes across serial measurements may suggest ACS but  many other  chronic and acute conditions are known to elevate hsTnI results.  Refer to the "Links" section for chest pain algorithms and additional  guidance. Performed at [facility], [address], [place], [place] [address]   Beta-hydroxybutyric acid     Status: Abnormal   Collection Time:  01/13/21  8:51 PM  Result Value Ref Range   Beta-Hydroxybutyric Acid 0.35 (H) 0.05 - 0.27 mmol/L    Comment: Performed at [facility], [address], [place], [place] [address]  Blood gas, venous     Status: Abnormal   Collection Time: 01/13/21  8:51 PM  Result Value Ref Range   FIO2 32.00    pH, Ven 7.408 7.250 - 7.430   pCO2, Ven 39.7 (L) 44.0 - 60.0 mmHg   pO2, Ven <31.0 (LL) 32.0 - 45.0 mmHg    Comment: CRITICAL RESULT CALLED TO, READ BACK BY AND VERIFIED WITH: HICKS,W AT 2104 ON 12.23.22 BY RUCINSKI,B    Bicarbonate 23.7 20.0 - 28.0 mmol/L   Acid-Base Excess 0.5 0.0 - 2.0 mmol/L   O2 Saturation 20.0 %   Patient temperature 36.1    Collection site RIGHT ANTECUBITAL    Drawn by [id number]    Sample type VENOUS     Comment: Performed at [facility], [address], [place], [place] [address]  CBG monitoring, ED     Status: Abnormal   Collection Time: 01/13/21  9:04 PM  Result Value Ref Range   Glucose-Capillary 290 (H) 70 - 99 mg/dL    Comment: Glucose reference range applies only to samples taken after fasting for at least 8 hours.  Lactic acid, plasma     Status: Abnormal   Collection Time: 01/13/21 10:31 PM  Result Value Ref Range   Lactic Acid, Venous 5.6 (HH) 0.5 - 1.9 mmol/L    Comment: CRITICAL VALUE NOTED.  VALUE IS CONSISTENT WITH PREVIOUSLY REPORTED AND CALLED VALUE. Performed at [facility], [address], [place], [place] [address]   CBG monitoring, ED     Status: Abnormal   Collection Time: 01/13/21 10:43 PM  Result Value Ref Range   Glucose-Capillary 211 (H) 70 - 99 mg/dL    Comment: Glucose reference range applies only to samples taken after fasting for at least 8 hours.  Glucose, capillary     Status: Abnormal   Collection Time: 01/13/21 11:38 PM  Result Value Ref Range   Glucose-Capillary 115 (H) 70 - 99 mg/dL    Comment: Glucose reference range applies only to samples taken after fasting for at least 8 hours.  Glucose, capillary     Status: Abnormal    Collection Time: 01/14/21 12:30 AM  Result Value Ref Range   Glucose-Capillary 106 (H) 70 - 99 mg/dL    Comment: Glucose reference range applies only to samples taken after fasting for at least 8 hours.  Glucose, capillary     Status: None   Collection Time: 01/14/21  1:14 AM  Result Value Ref Range   Glucose-Capillary 83 70 - 99 mg/dL    Comment: Glucose reference range applies only to samples taken after fasting for at least 8 hours.    Chemistries  Recent Labs  Lab 01/13/21 1905  NA 136  K 3.8  CL 97*  CO2 21*  GLUCOSE 319*  BUN 48*  CREATININE 2.21*  CALCIUM 8.9  AST 83*  ALT 98*  ALKPHOS 223*  BILITOT 1.1   ------------------------------------------------------------------------------------------------------------------  ------------------------------------------------------------------------------------------------------------------ GFR: Estimated Creatinine Clearance: 21.9 mL/min (A) (by C-G  formula based on SCr of 2.21 mg/dL (H)). Liver Function Tests: Recent Labs  Lab 01/13/21 1905  AST 83*  ALT 98*  ALKPHOS 223*  BILITOT 1.1  PROT 7.4  ALBUMIN 3.6   No results for input(s): LIPASE, AMYLASE in the last 168 hours. No results for input(s): AMMONIA in the last 168 hours. Coagulation Profile: No results for input(s): INR, PROTIME in the last 168 hours. Cardiac Enzymes: No results for input(s): CKTOTAL, CKMB, CKMBINDEX, TROPONINI in the last 168 hours. BNP (last 3 results) No results for input(s): PROBNP in the last 8760 hours. HbA1C: No results for input(s): HGBA1C in the last 72 hours. CBG: Recent Labs  Lab 01/13/21 2104 01/13/21 2243 01/13/21 2338 01/14/21 0030 01/14/21 0114  GLUCAP 290* 211* 115* 106* 83   Lipid Profile: No results for input(s): CHOL, HDL, LDLCALC, TRIG, CHOLHDL, LDLDIRECT in the last 72 hours. Thyroid Function Tests: No results for input(s): TSH, T4TOTAL, FREET4, T3FREE, THYROIDAB in the last 72 hours. Anemia Panel: No  results for input(s): VITAMINB12, FOLATE, FERRITIN, TIBC, IRON, RETICCTPCT in the last 72 hours.  --------------------------------------------------------------------------------------------------------------- Urine analysis:    Component Value Date/Time   COLORURINE YELLOW 10/17/2020 1140   APPEARANCEUR HAZY (A) 10/17/2020 1140   LABSPEC 1.014 10/17/2020 1140   PHURINE 7.0 10/17/2020 1140   GLUCOSEU >=500 (A) 10/17/2020 1140   HGBUR MODERATE (A) 10/17/2020 1140   BILIRUBINUR NEGATIVE 10/17/2020 1140   KETONESUR NEGATIVE 10/17/2020 1140   PROTEINUR >=300 (A) 10/17/2020 1140   UROBILINOGEN 0.2 02/18/2014 0950   NITRITE NEGATIVE 10/17/2020 1140   LEUKOCYTESUR MODERATE (A) 10/17/2020 1140      Imaging Results:    DG Chest Portable 1 View  Result Date: 01/13/2021 CLINICAL DATA:  Shortness of breath. EXAM: PORTABLE CHEST 1 VIEW COMPARISON:  September 27, 2020 FINDINGS: Stable moderate to marked severity diffuse chronic appearing increased interstitial lung markings are seen. Mild bilateral lower lobe infiltrates are seen, left greater than right. There is a small left pleural effusion. No pneumothorax is identified. The cardiac silhouette is enlarged and unchanged in size. There is moderate severity calcification of the aortic arch. A radiopaque fusion plate and screws are seen overlying the cervical spine. IMPRESSION: 1. Stable moderate to marked severity diffuse chronic appearing increased interstitial lung markings with mild bilateral lower lobe infiltrates, left greater than right. 2. Small left pleural effusion. Electronically Signed   By: Virgina Norfolk M.D.   On: 01/13/2021 19:10       Assessment & Plan:    Principal Problem:   Acute respiratory failure with hypoxia (HCC)   Acute respiratory failure with hypoxia O2 sats is low 75% Normalized on 3 L nasal cannula Chest x-ray shows infiltrates Also concerned for CHF exacerbation Wean off O2 as tolerated Continue treatment  as below Pneumonia Tachypneic, lactic acid 6.3, acute respiratory failure No leukocytosis, blood pressure stable, no tachycardia, afebrile Chest x-ray shows infiltrates greater on left than right Continue vancomycin and cefepime Continue to trend lactic acid Blood cultures pending  Elevated BNP BNP 2873 Peripheral edema and right lower extremity Interstitial lung markings on chest x-ray AKI that could be related to cardiorenal syndrome Start Lasix CHF order set utilized Echo in the a.m. Continue to monitor AKI Most likely related to cardiorenal syndrome Family is concerned that patient is dehydrated, but even after approximately 1.5 L in medication and 500 mL bolus in the ED, creatinine has not improved Start Lasix in the a.m. Avoid nephrotoxic agents when possible Continue to monitor Hyperglycemia  in the setting of diabetes mellitus type 2 Patient started on insulin drip for possible DKA with anion gap pH 7.4, bicarb 21, glucose 319 More likely anion gap is related to lactic acid then DKA Continue Endo tool for hyperglycemia At baseline patient is supposed to take 30 units of long-acting insulin at night -likely to resume that regimen soon  mood disorder Continue Risperdal Wife warned that patient may become very agitated in the hospital, as needed Haldol ordered Continue to monitor Paroxysmal atrial fibrillation Continue metoprolol   DVT Prophylaxis-   Heparin - SCDs   AM Labs Ordered, also please review Full Orders  Family Communication: Admission, patients condition and plan of care including tests being ordered have been discussed with the patient and wife who indicate understanding and agree with the plan and Code Status.  Code Status: DNR  Admission status: Inpatient :The appropriate admission status for this patient is INPATIENT. Inpatient status is judged to be reasonable and necessary in order to provide the required intensity of service to ensure the patient's  safety. The patient's presenting symptoms, physical exam findings, and initial radiographic and laboratory data in the context of their chronic comorbidities is felt to place them at high risk for further clinical deterioration. Furthermore, it is not anticipated that the patient will be medically stable for discharge from the hospital within 2 midnights of admission. The following factors support the admission status of inpatient.     The patient's presenting symptoms include dyspnea. The worrisome physical exam findings include hypoxia. The initial radiographic and laboratory data are worrisome because of infiltrates on chest x-ray, AKI. The chronic co-morbidities include mood disorder, paroxysmal atrial fibrillation, diabetes mellitus type 2.       * I certify that at the point of admission it is my clinical judgment that the patient will require inpatient hospital care spanning beyond 2 midnights from the point of admission due to high intensity of service, high risk for further deterioration and high frequency of surveillance required.*  Disposition: Anticipated Discharge date 48-72 hours discharge to home  Time spent in minutes : [place] DO

## 2021-01-14 NOTE — Progress Notes (Signed)
*  PRELIMINARY RESULTS* Echocardiogram 2D Echocardiogram has been performed.  Allen Yu 01/14/2021, 10:13 AM

## 2021-01-15 DIAGNOSIS — Z978 Presence of other specified devices: Secondary | ICD-10-CM | POA: Diagnosis not present

## 2021-01-15 DIAGNOSIS — N179 Acute kidney failure, unspecified: Secondary | ICD-10-CM | POA: Diagnosis not present

## 2021-01-15 DIAGNOSIS — J9601 Acute respiratory failure with hypoxia: Secondary | ICD-10-CM | POA: Diagnosis not present

## 2021-01-15 DIAGNOSIS — N401 Enlarged prostate with lower urinary tract symptoms: Secondary | ICD-10-CM

## 2021-01-15 DIAGNOSIS — N138 Other obstructive and reflux uropathy: Secondary | ICD-10-CM

## 2021-01-15 LAB — GLUCOSE, CAPILLARY
Glucose-Capillary: 21 mg/dL — CL (ref 70–99)
Glucose-Capillary: 22 mg/dL — CL (ref 70–99)
Glucose-Capillary: 38 mg/dL — CL (ref 70–99)
Glucose-Capillary: 63 mg/dL — ABNORMAL LOW (ref 70–99)
Glucose-Capillary: 86 mg/dL (ref 70–99)
Glucose-Capillary: 74 mg/dL (ref 70–99)
Glucose-Capillary: 44 mg/dL — CL (ref 70–99)
Glucose-Capillary: 125 mg/dL — ABNORMAL HIGH (ref 70–99)
Glucose-Capillary: 204 mg/dL — ABNORMAL HIGH (ref 70–99)

## 2021-01-15 LAB — COMPREHENSIVE METABOLIC PANEL
ALT: 121 U/L — ABNORMAL HIGH (ref 0–44)
AST: 111 U/L — ABNORMAL HIGH (ref 15–41)
Albumin: 3.4 g/dL — ABNORMAL LOW (ref 3.5–5.0)
Alkaline Phosphatase: 198 U/L — ABNORMAL HIGH (ref 38–126)
Anion gap: 16 — ABNORMAL HIGH (ref 5–15)
BUN: 50 mg/dL — ABNORMAL HIGH (ref 8–23)
CO2: 23 mmol/L (ref 22–32)
Calcium: 8.7 mg/dL — ABNORMAL LOW (ref 8.9–10.3)
Chloride: 100 mmol/L (ref 98–111)
Creatinine, Ser: 2.11 mg/dL — ABNORMAL HIGH (ref 0.61–1.24)
GFR, Estimated: 32 mL/min — ABNORMAL LOW (ref >60)
Glucose, Bld: 417 mg/dL — ABNORMAL HIGH (ref 70–99)
Potassium: 2.9 mmol/L — ABNORMAL LOW (ref 3.5–5.1)
Sodium: 139 mmol/L (ref 135–145)
Total Bilirubin: 1.1 mg/dL (ref 0.3–1.2)
Total Protein: 6.9 g/dL (ref 6.5–8.1)

## 2021-01-15 LAB — BASIC METABOLIC PANEL
Anion gap: 15 (ref 5–15)
BUN: 47 mg/dL — ABNORMAL HIGH (ref 8–23)
CO2: 23 mmol/L (ref 22–32)
Calcium: 8.2 mg/dL — ABNORMAL LOW (ref 8.9–10.3)
Chloride: 100 mmol/L (ref 98–111)
Creatinine, Ser: 1.94 mg/dL — ABNORMAL HIGH (ref 0.61–1.24)
GFR, Estimated: 35 mL/min — ABNORMAL LOW (ref >60)
Glucose, Bld: 81 mg/dL (ref 70–99)
Potassium: 2.8 mmol/L — ABNORMAL LOW (ref 3.5–5.1)
Sodium: 138 mmol/L (ref 135–145)

## 2021-01-15 LAB — MAGNESIUM: Magnesium: 1.5 mg/dL — ABNORMAL LOW (ref 1.7–2.4)

## 2021-01-15 LAB — CBC
HCT: 26.7 % — ABNORMAL LOW (ref 39.0–52.0)
Hemoglobin: 8.9 g/dL — ABNORMAL LOW (ref 13.0–17.0)
MCH: 31.1 pg (ref 26.0–34.0)
MCHC: 33.3 g/dL (ref 30.0–36.0)
MCV: 93.4 fL (ref 80.0–100.0)
Platelets: 328 10*3/uL (ref 150–400)
RBC: 2.86 MIL/uL — ABNORMAL LOW (ref 4.22–5.81)
RDW: 15.9 % — ABNORMAL HIGH (ref 11.5–15.5)
WBC: 7.2 10*3/uL (ref 4.0–10.5)
nRBC: 0 % (ref 0.0–0.2)

## 2021-01-15 LAB — LACTIC ACID, PLASMA: Lactic Acid, Venous: 1.9 mmol/L (ref 0.5–1.9)

## 2021-01-15 LAB — PHOSPHORUS: Phosphorus: 4.4 mg/dL (ref 2.5–4.6)

## 2021-01-15 MED ORDER — POTASSIUM CHLORIDE 10 MEQ/100ML IV SOLN
10.0000 meq | INTRAVENOUS | Status: AC
  Administered 2021-01-15 (×4): 10 meq via INTRAVENOUS
  Filled 2021-01-15 (×3): qty 100

## 2021-01-15 MED ORDER — MAGNESIUM SULFATE 4 GM/100ML IV SOLN
4.0000 g | Freq: Once | INTRAVENOUS | Status: AC
  Administered 2021-01-15: 10:00:00 4 g via INTRAVENOUS
  Filled 2021-01-15: qty 100

## 2021-01-15 MED ORDER — DEXTROSE 10 % IV SOLN
INTRAVENOUS | Status: DC
Start: 2021-01-15 — End: 2021-01-16

## 2021-01-15 MED ORDER — POTASSIUM CHLORIDE 10 MEQ/100ML IV SOLN
10.0000 meq | INTRAVENOUS | Status: AC
  Administered 2021-01-15 (×3): 10 meq via INTRAVENOUS
  Filled 2021-01-15 (×4): qty 100

## 2021-01-15 NOTE — Progress Notes (Signed)
PROGRESS NOTE    Allen Yu  JQB:[id number] DOB: 1945/07/27 DOA: 01/13/2021 PCP: [name], MD    Brief Narrative:  75 year old male with a history of dementia, PTSD, paroxysmal atrial fibrillation, diabetes, hypertension, systolic dysfunction who is not on chronic diuretics.  He was brought to the hospital with progressive shortness of breath and acute respiratory failure.  Noted to be in decompensated CHF.  Repeat echocardiogram shows drop in ejection fraction of 25 to 30% with global hypokinesis.  He is noted to have elevated LFTs and acute kidney injury, likely related to cardiorenal/low output.  He has been started on intravenous diuretics.   Assessment & Plan:   Principal Problem:   Acute respiratory failure with hypoxia (HCC) Active Problems:   Type 2 diabetes mellitus with other circulatory complications (HCC)   Acute kidney injury superimposed on chronic kidney disease (HCC)   S/P AKA (above knee amputation) unilateral, left (HCC)   Unspecified atrial fibrillation (HCC)   Indwelling Foley catheter present   Peripheral vascular disease (HCC)   Benign prostatic hyperplasia with urinary obstruction   Acute respiratory failure with hypoxia -Related decompensated CHF -Currently on 3 L of oxygen -We will continue to wean down as tolerated  Acute on chronic systolic CHF -Patient presented with volume overload -Does not appear to be on chronic diuretics -EF has further declined when compared to echocardiogram from 09/2020 -Current EF 25 to 30% -Started on intravenous Lasix -Appears to be having good urine output with Lasix 80 IV twice daily -Continue current treatments  Acute kidney injury -Related to cardiorenal syndrome and low output -Creatinine 2.2 on admission -Baseline creatinine of 1.1 -Current creatinine 1.9 -Continue to monitor with diuresis  Elevated liver enzymes -Likely related to hepatic congestion from decreased cardiac output -Continue to monitor  in the setting of diuresis  Type 2 diabetes -Initially presented with hyperglycemia and was started on insulin infusion -Subsequently transition to subcutaneous insulin -He was transitioned to 30 units of basal insulin, although I suspect this may be more than what he takes at home -He has been hypoglycemic through the day and needed to be put on dextrose infusion -We will discontinue further basal insulin until blood sugars have stabilized -A1c 8.8  Paroxysmal atrial fibrillation -Currently on metoprolol, heart rate stable -He is not on chronic anticoagulation  Anemia, likely related to chronic kidney disease -Continue to follow hemoglobin, no signs of bleeding  BPH with chronic indwelling Foley catheter  PTSD/dementia -Continue on Risperdal -Haldol as needed since he has become agitated in the hospital in the past   DVT prophylaxis: heparin injection 5,000 Units Start: 01/13/21 2230 SCDs Start: 01/13/21 2219  Code Status: DNR Family Communication: Updated patient's wife Disposition Plan: Status is: Inpatient  Remains inpatient appropriate because: Needs continued IV diuresis       Consultants:    Procedures:  Echocardiogram: EF 25 to 30% with global hypokinesis.  EF is lower as compared to echo from 09/2020  Antimicrobials:      Subjective: Sleeping on my arrival, wakes up to voice.  Says he is not short of breath.  Objective: Vitals:   01/15/21 1500 01/15/21 1600 01/15/21 1700 01/15/21 1815  BP: (!) 179/142 (!) 165/133 (!) 167/141 133/82  Pulse: (!) 39 (!) 53  86  Resp: 16 17 (!) 21 (!) 21  Temp:      TempSrc:      SpO2: 99% 100%  (!) 72%  Weight:      Height:  Intake/Output Summary (Last 24 hours) at 01/15/2021 1910 Last data filed at 01/15/2021 1838 Gross per 24 hour  Intake 2424.09 ml  Output 3545 ml  Net -1120.91 ml   Filed Weights   01/13/21 2025 01/13/21 2315 01/15/21 0500  Weight: 53.5 kg 54.6 kg 53.7 kg     Examination:  General exam: Appears calm and comfortable  Respiratory system: Crackles at bases. Respiratory effort normal. Cardiovascular system: S1 & S2 heard, RRR. No murmurs, rubs, gallops or clicks.  Gastrointestinal system: Abdomen is nondistended, soft and nontender. No organomegaly or masses felt. Normal bowel sounds heard. Central nervous system: Alert and oriented. No focal neurological deficits. Extremities: 1+ edema bilaterally Skin: No rashes, lesions or ulcers Psychiatry: Confused    Data Reviewed: I have personally reviewed following labs and imaging studies  CBC: Recent Labs  Lab 01/13/21 1905 01/14/21 0447 01/15/21 0519  WBC 7.4 6.7 7.2  NEUTROABS 6.1 5.2  --   HGB 8.8* 7.8* 8.9*  HCT 26.6* 24.7* 26.7*  MCV 94.3 94.3 93.4  PLT 353 297 621   Basic Metabolic Panel: Recent Labs  Lab 01/14/21 0107 01/14/21 0447 01/14/21 0909 01/15/21 0519 01/15/21 1311  NA 139 140 139 139 138  K 3.7 3.7 3.7 2.9* 2.8*  CL 106 104 103 100 100  CO2 19* 23 24 23 23   GLUCOSE 82 124* 98 417* 81  BUN 48* 50* 53* 50* 47*  CREATININE 2.24* 2.18* 2.24* 2.11* 1.94*  CALCIUM 8.2* 8.5* 8.5* 8.7* 8.2*  MG  --  1.6*  --  1.5*  --   PHOS  --   --   --  4.4  --    GFR: Estimated Creatinine Clearance: 25 mL/min (A) (by C-G formula based on SCr of 1.94 mg/dL (H)). Liver Function Tests: Recent Labs  Lab 01/13/21 1905 01/14/21 0447 01/15/21 0519  AST 83* 86* 111*  ALT 98* 93* 121*  ALKPHOS 223* 193* 198*  BILITOT 1.1 0.8 1.1  PROT 7.4 6.4* 6.9  ALBUMIN 3.6 3.1* 3.4*   No results for input(s): LIPASE, AMYLASE in the last 168 hours. No results for input(s): AMMONIA in the last 168 hours. Coagulation Profile: No results for input(s): INR, PROTIME in the last 168 hours. Cardiac Enzymes: No results for input(s): CKTOTAL, CKMB, CKMBINDEX, TROPONINI in the last 168 hours. BNP (last 3 results) No results for input(s): PROBNP in the last 8760 hours. HbA1C: Recent Labs     01/14/21 0447  HGBA1C 8.8*   CBG: Recent Labs  Lab 01/15/21 0313 01/15/21 0359 01/15/21 0735 01/15/21 1134 01/15/21 1616  GLUCAP 63* 86 74 44* 125*   Lipid Profile: No results for input(s): CHOL, HDL, LDLCALC, TRIG, CHOLHDL, LDLDIRECT in the last 72 hours. Thyroid Function Tests: Recent Labs    01/14/21 0447  TSH 2.646   Anemia Panel: No results for input(s): VITAMINB12, FOLATE, FERRITIN, TIBC, IRON, RETICCTPCT in the last 72 hours. Sepsis Labs: Recent Labs  Lab 01/13/21 2231 01/14/21 0107 01/14/21 1527 01/15/21 0519  LATICACIDVEN 5.6* 4.6* 2.1* 1.9    Recent Results (from the past 240 hour(s))  Resp Panel by RT-PCR (Flu A&B, Covid) Nasopharyngeal Swab     Status: None   Collection Time: 01/13/21  7:15 PM   Specimen: Nasopharyngeal Swab; Nasopharyngeal(NP) swabs in vial transport medium  Result Value Ref Range Status   SARS Coronavirus 2 by RT PCR NEGATIVE NEGATIVE Final    Comment: (NOTE) SARS-CoV-2 target nucleic acids are NOT DETECTED.  The SARS-CoV-2 RNA is generally detectable  in upper respiratory specimens during the acute phase of infection. The lowest concentration of SARS-CoV-2 viral copies this assay can detect is 138 copies/mL. A negative result does not preclude SARS-Cov-2 infection and should not be used as the sole basis for treatment or other patient management decisions. A negative result may occur with  improper specimen collection/handling, submission of specimen other than nasopharyngeal swab, presence of viral mutation(s) within the areas targeted by this assay, and inadequate number of viral copies(<138 copies/mL). A negative result must be combined with clinical observations, patient history, and epidemiological information. The expected result is Negative.  Fact Sheet for Patients:  EntrepreneurPulse.com.au  Fact Sheet for Healthcare Providers:  IncredibleEmployment.be  This test is no t yet  approved or cleared by the [place] FDA and  has been authorized for detection and/or diagnosis of SARS-CoV-2 by FDA under an Emergency Use Authorization (EUA). This EUA will remain  in effect (meaning this test can be used) for the duration of the COVID-19 declaration under Section 564(b)(1) of the Act, 21 U.S.C.section 360bbb-3(b)(1), unless the authorization is terminated  or revoked sooner.       Influenza A by PCR NEGATIVE NEGATIVE Final   Influenza B by PCR NEGATIVE NEGATIVE Final    Comment: (NOTE) The Xpert Xpress SARS-CoV-2/FLU/RSV plus assay is intended as an aid in the diagnosis of influenza from Nasopharyngeal swab specimens and should not be used as a sole basis for treatment. Nasal washings and aspirates are unacceptable for Xpert Xpress SARS-CoV-2/FLU/RSV testing.  Fact Sheet for Patients: EntrepreneurPulse.com.au  Fact Sheet for Healthcare Providers: IncredibleEmployment.be  This test is not yet approved or cleared by the [place] FDA and has been authorized for detection and/or diagnosis of SARS-CoV-2 by FDA under an Emergency Use Authorization (EUA). This EUA will remain in effect (meaning this test can be used) for the duration of the COVID-19 declaration under Section 564(b)(1) of the Act, 21 U.S.C. section 360bbb-3(b)(1), unless the authorization is terminated or revoked.  Performed at [facility], [address] [place], [place] [address]   Culture, blood (routine x 2)     Status: None (Preliminary result)   Collection Time: 01/13/21  8:25 PM   Specimen: Right Antecubital; Blood  Result Value Ref Range Status   Specimen Description   Final    RIGHT ANTECUBITAL BOTTLES DRAWN AEROBIC AND ANAEROBIC   Special Requests Blood Culture adequate volume  Final   Culture   Final    NO GROWTH < 12 HOURS Performed at [facility], [address], [place], [place] [address]    Report Status PENDING  Incomplete   Culture, blood (routine x 2)     Status: None (Preliminary result)   Collection Time: 01/13/21  8:33 PM   Specimen: BLOOD RIGHT ARM  Result Value Ref Range Status   Specimen Description BLOOD RIGHT ARM  Final   Special Requests   Final    BOTTLES DRAWN AEROBIC ONLY Blood Culture results may not be optimal due to an inadequate volume of blood received in culture bottles   Culture   Final    NO GROWTH < 12 HOURS Performed at [facility], [address], [place], [place] [address]    Report Status PENDING  Incomplete  Urine Culture     Status: Abnormal (Preliminary result)   Collection Time: 01/13/21 10:04 PM   Specimen: Urine, Catheterized  Result Value Ref Range Status   Specimen Description   Final    URINE, CATHETERIZED Performed at [facility]  [facility], [address], [place], [place] [address]    Special Requests   Final    NONE Performed at [facility], [address] [place], [place] [address]    Culture (A)  Final    50,000 COLONIES/mL GRAM NEGATIVE RODS >=100,000 COLONIES/mL ENTEROCOCCUS FAECALIS SUSCEPTIBILITIES TO FOLLOW Performed at [facility], [place] [address], [place], [place] [address]    Report Status PENDING  Incomplete  MRSA Next Gen by PCR, Nasal     Status: None   Collection Time: 01/14/21  2:24 PM   Specimen: Nasal Mucosa; Nasal Swab  Result Value Ref Range Status   MRSA by PCR Next Gen NOT DETECTED NOT DETECTED Final    Comment: (NOTE) The GeneXpert MRSA Assay (FDA approved for NASAL specimens only), is one component of a comprehensive MRSA colonization surveillance program. It is not intended to diagnose MRSA infection nor to guide or monitor treatment for MRSA infections. Test performance is not FDA approved in patients less than 53 years old. Performed at [facility], [address] [place], [place] [address]          Radiology Studies: ECHOCARDIOGRAM COMPLETE  Result Date: 01/14/2021    ECHOCARDIOGRAM REPORT   Patient Name:   Allen Yu Date of Exam: 01/14/2021 Medical Rec #:  [id number]      Height:       73.0 in Accession #:    [id number]     Weight:       120.4 lb Date of Birth:  04/17/1945     BSA:          1.734 m Patient Age:    9 years       BP:           116/90 mmHg Patient Gender: M              HR:           77 bpm. Exam Location:  [facility] Procedure: 2D Echo, Cardiac Doppler and Color Doppler Indications:    Congestive Heart Failure I50.9  History:        Patient has prior history of Echocardiogram examinations, most                 recent 09/27/2020. CHF, COPD, Arrythmias:Atrial Fibrillation; Risk                 Factors:Diabetes and Hypertension. PTSD, dementia,                 schizophrenia.  Sonographer:    Alvino Chapel RCS Referring Phys: [id number] ASIA B [place]  Sonographer Comments: Patient confused/combative at times. IMPRESSIONS  1. Left ventricular ejection fraction, by estimation, is 25 to 30%. The left ventricle has severely decreased function. The left ventricle demonstrates global hypokinesis with some regional variation. There is moderate left ventricular hypertrophy. Left  ventricular diastolic parameters are indeterminate.  2. Right ventricular systolic function is mildly reduced. The right ventricular size is normal. There is severely elevated pulmonary artery systolic pressure. The estimated right ventricular systolic pressure is 80.9 mmHg.  3. Left atrial size was moderately dilated.  4. Right atrial size was moderately dilated.  5. There is a trivial pericardial effusion posterior to the left ventricle and localized near the right atrium.  6. The mitral valve is degenerative. Moderate mitral valve regurgitation.  7. Tricuspid valve regurgitation is moderate.  8. The aortic valve is tricuspid. There is mild calcification of the aortic valve. Aortic  valve regurgitation is not visualized.  9. The inferior vena cava is dilated in size with <50% respiratory variability, suggesting right atrial pressure of 15  mmHg. Comparison(s): Prior images reviewed side by side. LVEF has decreased further and estimated PASP is higher. FINDINGS  Left Ventricle: Left ventricular ejection fraction, by estimation, is 25 to 30%. The left ventricle has severely decreased function. The left ventricle demonstrates global hypokinesis. The left ventricular internal cavity size was normal in size. There is moderate left ventricular hypertrophy. Left ventricular diastolic parameters are indeterminate. Right Ventricle: The right ventricular size is normal. No increase in right ventricular wall thickness. Right ventricular systolic function is mildly reduced. There is severely elevated pulmonary artery systolic pressure. The tricuspid regurgitant velocity is 3.56 m/s, and with an assumed right atrial pressure of 15 mmHg, the estimated right ventricular systolic pressure is 93.9 mmHg. Left Atrium: Left atrial size was moderately dilated. Right Atrium: Right atrial size was moderately dilated. Pericardium: Trivial pericardial effusion is present. The pericardial effusion is posterior to the left ventricle and localized near the right atrium. Mitral Valve: The mitral valve is degenerative in appearance. There is mild thickening of the mitral valve leaflet(s). Moderate mitral valve regurgitation. Tricuspid Valve: The tricuspid valve is grossly normal. Tricuspid valve regurgitation is moderate. Aortic Valve: The aortic valve is tricuspid. There is mild calcification of the aortic valve. There is mild aortic valve annular calcification. Aortic valve regurgitation is not visualized. Pulmonic Valve: The pulmonic valve was grossly normal. Pulmonic valve regurgitation is trivial. Aorta: The aortic root is normal in size and structure. Venous: The inferior vena cava is dilated in size with less than 50% respiratory variability, suggesting right atrial pressure of 15 mmHg. IAS/Shunts: No atrial level shunt detected by color flow Doppler. Additional Comments:  There is pleural effusion in the left lateral region.  LEFT VENTRICLE PLAX 2D LVIDd:         5.20 cm LVIDs:         4.40 cm LV PW:         1.50 cm LV IVS:        1.30 cm LVOT diam:     1.90 cm LV SV:         27 LV SV Index:   15 LVOT Area:     2.84 cm  RIGHT VENTRICLE TAPSE (M-mode): 1.2 cm LEFT ATRIUM             Index        RIGHT ATRIUM           Index LA diam:        3.90 cm 2.25 cm/m   RA Area:     23.70 cm LA Vol (A2C):   82.1 ml 47.35 ml/m  RA Volume:   79.70 ml  45.96 ml/m LA Vol (A4C):   83.5 ml 48.15 ml/m LA Biplane Vol: 85.9 ml 49.54 ml/m  AORTIC VALVE LVOT Vmax:   54.00 cm/s LVOT Vmean:  34.800 cm/s LVOT VTI:    0.094 m  AORTA Ao Root diam: 3.80 cm MITRAL VALVE                  TRICUSPID VALVE MV Area (PHT): 2.91 cm       TR Peak grad:   50.7 mmHg MV Decel Time: 261 msec       TR Vmax:        356.00 cm/s MR Peak grad:    71.6 mmHg MR Mean grad:  51.0 mmHg    SHUNTS MR Vmax:         423.00 cm/s  Systemic VTI:  0.09 m MR Vmean:        334.0 cm/s   Systemic Diam: 1.90 cm MR PISA:         4.02 cm MR PISA Eff ROA: 30 mm MR PISA Radius:  0.80 cm MV E velocity: 88.10 cm/s Rozann Lesches MD Electronically signed by Rozann Lesches MD Signature Date/Time: 01/14/2021/2:11:21 PM    Final         Scheduled Meds:  aspirin EC  81 mg Oral Daily   atorvastatin  40 mg Oral QHS   Chlorhexidine Gluconate Cloth  6 each Topical Daily   clopidogrel  75 mg Oral Daily   donepezil  10 mg Oral QHS   finasteride  5 mg Oral Daily   furosemide  80 mg Intravenous BID   heparin  5,000 Units Subcutaneous Q8H   insulin aspart  0-5 Units Subcutaneous QHS   insulin aspart  0-9 Units Subcutaneous TID WC   metoprolol succinate  50 mg Oral Daily   mirtazapine  15 mg Oral QHS   nicotine  21 mg Transdermal Daily   risperiDONE  1 mg Oral BID   tamsulosin  0.4 mg Oral Daily   Continuous Infusions:  dextrose 75 mL/hr at 01/15/21 1838   potassium chloride 10 mEq (01/15/21 1836)     LOS: 2 days    Time  spent: 41mins    Kathie Dike, MD [facility]   If 7PM-7AM, please contact night-coverage [email or web address]  01/15/2021, 7:10 PM

## 2021-01-16 ENCOUNTER — Encounter (HOSPITAL_COMMUNITY): Payer: Self-pay | Admitting: Family Medicine

## 2021-01-16 DIAGNOSIS — N401 Enlarged prostate with lower urinary tract symptoms: Secondary | ICD-10-CM | POA: Diagnosis not present

## 2021-01-16 DIAGNOSIS — Z515 Encounter for palliative care: Secondary | ICD-10-CM | POA: Diagnosis not present

## 2021-01-16 DIAGNOSIS — Z978 Presence of other specified devices: Secondary | ICD-10-CM | POA: Diagnosis not present

## 2021-01-16 DIAGNOSIS — J9601 Acute respiratory failure with hypoxia: Secondary | ICD-10-CM | POA: Diagnosis not present

## 2021-01-16 DIAGNOSIS — Z7189 Other specified counseling: Secondary | ICD-10-CM | POA: Diagnosis not present

## 2021-01-16 DIAGNOSIS — N179 Acute kidney failure, unspecified: Secondary | ICD-10-CM | POA: Diagnosis not present

## 2021-01-16 LAB — URINE CULTURE: Culture: 50000 — AB

## 2021-01-16 LAB — CBC
HCT: 22.8 % — ABNORMAL LOW (ref 39.0–52.0)
Hemoglobin: 7.5 g/dL — ABNORMAL LOW (ref 13.0–17.0)
MCH: 31.4 pg (ref 26.0–34.0)
MCHC: 32.9 g/dL (ref 30.0–36.0)
MCV: 95.4 fL (ref 80.0–100.0)
Platelets: 265 10*3/uL (ref 150–400)
RBC: 2.39 MIL/uL — ABNORMAL LOW (ref 4.22–5.81)
RDW: 16.2 % — ABNORMAL HIGH (ref 11.5–15.5)
WBC: 8.2 10*3/uL (ref 4.0–10.5)
nRBC: 0.2 % (ref 0.0–0.2)

## 2021-01-16 LAB — BASIC METABOLIC PANEL
Anion gap: 14 (ref 5–15)
BUN: 46 mg/dL — ABNORMAL HIGH (ref 8–23)
CO2: 23 mmol/L (ref 22–32)
Calcium: 7.8 mg/dL — ABNORMAL LOW (ref 8.9–10.3)
Chloride: 95 mmol/L — ABNORMAL LOW (ref 98–111)
Creatinine, Ser: 2 mg/dL — ABNORMAL HIGH (ref 0.61–1.24)
GFR, Estimated: 34 mL/min — ABNORMAL LOW (ref >60)
Glucose, Bld: 254 mg/dL — ABNORMAL HIGH (ref 70–99)
Potassium: 3.6 mmol/L (ref 3.5–5.1)
Sodium: 132 mmol/L — ABNORMAL LOW (ref 135–145)

## 2021-01-16 LAB — GLUCOSE, CAPILLARY
Glucose-Capillary: 293 mg/dL — ABNORMAL HIGH (ref 70–99)
Glucose-Capillary: 208 mg/dL — ABNORMAL HIGH (ref 70–99)
Glucose-Capillary: 130 mg/dL — ABNORMAL HIGH (ref 70–99)
Glucose-Capillary: 192 mg/dL — ABNORMAL HIGH (ref 70–99)

## 2021-01-16 NOTE — Plan of Care (Signed)
°  Problem: Acute Rehab PT Goals(only PT should resolve) Goal: Pt Will Go Supine/Side To Sit Outcome: Progressing Flowsheets (Taken 01/16/2021 1119) Pt will go Supine/Side to Sit: with supervision Goal: Pt Will Go Sit To Supine/Side Outcome: Progressing Flowsheets (Taken 01/16/2021 1119) Pt will go Sit to Supine/Side: with supervision Goal: Patient Will Transfer Sit To/From Stand Outcome: Progressing Flowsheets (Taken 01/16/2021 1119) Patient will transfer sit to/from stand: with minimal assist Goal: Pt Will Transfer Bed To Chair/Chair To Bed Outcome: Progressing Flowsheets (Taken 01/16/2021 1119) Pt will Transfer Bed to Chair/Chair to Bed: with min assist  11:19 AM, 01/16/21 M. Sherlyn Lees, PT, DPT Physical Therapist- [place] Office Number: [phone]

## 2021-01-16 NOTE — Plan of Care (Signed)
°  Problem: Acute Rehab OT Goals (only OT should resolve) Goal: Pt. Will Perform Eating Flowsheets (Taken 01/16/2021 1023) Pt Will Perform Eating:  with modified independence  sitting Goal: Pt. Will Perform Grooming Flowsheets (Taken 01/16/2021 1023) Pt Will Perform Grooming:  with modified independence  sitting Goal: Pt. Will Perform Upper Body Dressing Flowsheets (Taken 01/16/2021 1023) Pt Will Perform Upper Body Dressing:  with supervision  sitting Goal: Pt. Will Transfer To Toilet Flowsheets (Taken 01/16/2021 1023) Pt Will Transfer to Toilet:  with min assist  stand pivot transfer  bedside commode Goal: Pt. Will Perform Toileting-Clothing Manipulation Flowsheets (Taken 01/16/2021 1023) Pt Will Perform Toileting - Clothing Manipulation and hygiene:  with supervision  sitting/lateral leans Goal: Pt/Caregiver Will Perform Home Exercise Program Flowsheets (Taken 01/16/2021 1023) Pt/caregiver will Perform Home Exercise Program:  Increased strength  Both right and left upper extremity  With Supervision  With written HEP provided

## 2021-01-16 NOTE — Progress Notes (Signed)
Inpatient Diabetes Program Recommendations  AACE/ADA: New Consensus Statement on Inpatient Glycemic Control (2015)  Target Ranges:  Prepandial:   less than 140 mg/dL      Peak postprandial:   less than 180 mg/dL (1-2 hours)      Critically ill patients:  140 - 180 mg/dL    Latest Reference Range & Units 01/14/21 00:30 01/14/21 01:14 01/14/21 02:56 01/14/21 04:38 01/14/21 08:55 01/14/21 12:11 01/14/21 16:14 01/14/21 20:01  Glucose-Capillary 70 - 99 mg/dL 106 (H) 83 98 104 (H) 87 92 94 108 (H)  30 units Levemir @2135     Latest Reference Range & Units 01/15/21 02:12 01/15/21 02:28 01/15/21 02:45 01/15/21 03:13 01/15/21 03:59 01/15/21 07:35 01/15/21 11:34 01/15/21 16:16 01/15/21 19:28  Glucose-Capillary 70 - 99 mg/dL 22 (LL) 21 (LL) 38 (LL) 63 (L) 86 74 44 (LL) 125 (H)  1 unit Novolog  204 (H)  2 units Novolog @2141     Latest Reference Range & Units 01/16/21 07:26  Glucose-Capillary 70 - 99 mg/dL 293 (H)  5 units Novolog     Latest Reference Range & Units 07/24/20 03:28 09/24/20 18:32 01/14/21 04:47  Hemoglobin A1C 4.8 - 5.6 % 8.7 (H) 9.7 (H) 8.8 (H)    Admit with: Acute respiratory failure with hypoxia  History: DM, CKD, Schizophrenia, Dementia  Home DM Meds: Lantus 30 units QHS       Metformin 500 mg BID  Current Orders: Novolog Sensitive Correction Scale/ SSI (0-9 units) TID AC + HS     Note severe Hypoglycemia on 12/25 after receiving 30 units Levemir insulin on PM of 12/24.  CBG now 293 this AM--Note D10% IVF stopped this AM  MD- Please consider adding back smaller portion of pt's home dose basal insulin:  Semglee 10 units Daily (1/3 total home dose)    --Will follow patient during hospitalization--  Wyn Quaker RN, MSN, CDE Diabetes Coordinator Inpatient Glycemic Control Team Team Pager: [phone] (8a-5p)

## 2021-01-16 NOTE — Consult Note (Signed)
Consultation Note Date: 01/16/2021   Patient Name: Allen Yu  DOB: [birth year]  MRN: [id number]  Age / Sex: 75 y.o., male  PCP: [name], MD Referring Physician: Kathie Dike, MD  Reason for Consultation: Establishing goals of care  HPI/Patient Profile: 75 y.o. male  with past medical history of dementia, schizophrenia, PTSD, CHF, CKD, DM2, HTN, PVD with left AKA, chronic back pain, history of lung nodule, history of paroxysmal A. fib, treated tuberculosis admitted on 01/13/2021 with acute hypoxic respiratory failure with hypoxia related to decompensated heart failure/acute on chronic heart failure.   Clinical Assessment and Goals of Care: I have reviewed medical records including EPIC notes, labs and imaging, received report from RN, assessed the patient.  [name] is lying quietly in bed.  He has known dementia and is unable to interact with me in any meaningful way.  He declines food and drink at this time.  I do not believe that he can make his basic needs known.  There is no family at bedside at this time.    Call to wife, Allen Yu.  Left voicemail message sharing that palliative medicine is here as an extra layer of support, available to answer questions.    Conference with attending, bedside nursing staff, transition of care team related to patient condition, needs, goals of care, disposition.   HCPOA   NEXT OF KIN -wife, Allen Yu    SUMMARY OF RECOMMENDATIONS   At this point continue to treat the treatable but no CPR or intubation Time for outcomes [facility], [facility]. TOC working with family   Code Status/Advance Care Planning: DNR  Symptom Management:  Per hospitalist, no additional needs at this time.  Palliative Prophylaxis:  Frequent Pain Assessment and Turn Reposition  Additional Recommendations (Limitations, Scope, Preferences): Treat  the treatable but no CPR or intubation.  Psycho-social/Spiritual:  Desire for further Chaplaincy support:no Additional Recommendations: Caregiving  Support/Resources and Education on Hospice  Prognosis:  Unable to determine, based on outcomes.  6 months or less would not be surprising based on chronic illness burden, advancing dementia, decreasing functional status.  Discharge Planning:  To be determined, based on outcomes and family choice.  Anticipate return home, [facility] connected.       Primary Diagnoses: Present on Admission:  Acute respiratory failure with hypoxia ([place])  Type 2 diabetes mellitus with other circulatory complications (HCC)  Acute kidney injury superimposed on chronic kidney disease ([place])  Indwelling Foley catheter present  Unspecified atrial fibrillation ([place])  Peripheral vascular disease ([place])  Benign prostatic hyperplasia with urinary obstruction   I have reviewed the medical record, interviewed the patient and family, and examined the patient. The following aspects are pertinent.  Past Medical History:  Diagnosis Date   Anemia    Arthritis    Carotid stenosis    Chronic back pain    Chronic kidney disease    Constipation    Dementia (HCC)    Diabetes mellitus    GERD (gastroesophageal reflux disease)  History of kidney stones    Hypertension    Lung nodule    PAF (paroxysmal atrial fibrillation) (HCC)    Peripheral neuropathy    Peripheral vascular disease (HCC)    PTSD (post-traumatic stress disorder)    Schizophrenia ([place])    Tuberculosis    Treated   Social History   Socioeconomic History   Marital status: Married    Spouse name: [name]   Number of children: Not on file   Years of education: Not on file   Highest education level: Not on file  Occupational History   Not on file  Tobacco Use   Smoking status: Every Day    Packs/day: 0.50    Types: Cigarettes   Smokeless tobacco: Never   Tobacco comments:    burns them up  Vaping  Use   Vaping Use: Never used  Substance and Sexual Activity   Alcohol use: No    Alcohol/week: 0.0 standard drinks   Drug use: Yes    Types: Marijuana    Comment: almost daily for pain   Sexual activity: Never  Other Topics Concern   Not on file  Social History Narrative   Not on file   Social Determinants of Health   Financial Resource Strain: Low Risk    Difficulty of Paying Living Expenses: Not hard at all  Food Insecurity: No Food Insecurity   Worried About Charity fundraiser in the Last Year: Never true   Ran Out of Food in the Last Year: Never true  Transportation Needs: No Transportation Needs   Lack of Transportation (Medical): No   Lack of Transportation (Non-Medical): No  Physical Activity: Not on file  Stress: No Stress Concern Present   Feeling of Stress : Only a little  Social Connections: Engineer, building services of Communication with Friends and Family: More than three times a week   Frequency of Social Gatherings with Friends and Family: More than three times a week   Attends Religious Services: 1 to 4 times per year   Active Member of [employer] or Organizations: Yes   Attends Music therapist: 1 to 4 times per year   Marital Status: Married   Family History  Problem Relation Age of Onset   Heart disease Mother        before age 38   Scheduled Meds:  aspirin EC  81 mg Oral Daily   atorvastatin  40 mg Oral QHS   Chlorhexidine Gluconate Cloth  6 each Topical Daily   clopidogrel  75 mg Oral Daily   donepezil  10 mg Oral QHS   finasteride  5 mg Oral Daily   furosemide  80 mg Intravenous BID   heparin  5,000 Units Subcutaneous Q8H   insulin aspart  0-5 Units Subcutaneous QHS   insulin aspart  0-9 Units Subcutaneous TID WC   metoprolol succinate  50 mg Oral Daily   mirtazapine  15 mg Oral QHS   nicotine  21 mg Transdermal Daily   risperiDONE  1 mg Oral BID   tamsulosin  0.4 mg Oral Daily   Continuous Infusions: PRN  Meds:.acetaminophen **OR** acetaminophen, dextrose, haloperidol lactate, ondansetron **OR** ondansetron (ZOFRAN) IV, oxyCODONE Medications Prior to Admission:  Prior to Admission medications   Medication Sig Start Date End Date Taking? Authorizing Provider  acetaminophen (TYLENOL) 500 MG tablet Take 1,000 mg by mouth every 6 (six) hours as needed for moderate pain.   Yes [provider]  atorvastatin (LIPITOR)  40 MG tablet Take 40 mg by mouth at bedtime.   Yes [provider]  Bacitracin-Polymyxin B (NEOSPORIN EX) Apply 1 application topically daily as needed (wound care).   Yes [provider]  donepezil (ARICEPT) 10 MG tablet Take 10 mg by mouth at bedtime.  06/28/16  Yes [provider]  gabapentin (NEURONTIN) 100 MG capsule Take 100 mg by mouth at bedtime. 11/06/19  Yes [provider]  LANTUS 100 UNIT/ML injection Inject 0.1 mLs (10 Units total) into the skin at bedtime as needed (High blood glucose). Patient taking differently: Inject 30 Units into the skin at bedtime. 06/30/20  Yes Mercy Riding, MD  metFORMIN (GLUCOPHAGE) 500 MG tablet Take by mouth 2 (two) times daily with a meal. Take 1 daily for 7 days then 2 times daily   Yes [provider]  metoprolol succinate (TOPROL-XL) 50 MG 24 hr tablet Take 1 tablet (50 mg total) by mouth daily. Take with or immediately following a meal. 11/29/20 02/27/21 Yes Strader, [place] M, PA-C  mirtazapine (REMERON) 15 MG tablet Take 7.5 mg by mouth at bedtime. 08/12/20  Yes [provider]  traMADol (ULTRAM) 50 MG tablet Take 50 mg by mouth every 6 (six) hours as needed for moderate pain. 07/18/20  Yes [provider]  Vitamin D, Ergocalciferol, (DRISDOL) 1.25 MG (50000 UNIT) CAPS capsule Take 50,000 Units by mouth every Friday.   Yes [provider]  Alcohol Swabs (ALCOHOL PADS) 70 % PADS USE 1 PAD AS DIRECTED 10/12/20 10/13/21  [provider]  aspirin EC 81 MG tablet Take 1  tablet (81 mg total) by mouth daily. Swallow whole. Patient not taking: Reported on 01/14/2021 10/27/20 10/27/21  Marty Heck, MD  B-D UF III MINI PEN NEEDLES 31G X 5 MM MISC SMARTSIG:1 Each SUB-Q Daily 12/23/19   [provider]  cefdinir (OMNICEF) 300 MG capsule Take 300 mg by mouth every 12 (twelve) hours. Patient not taking: Reported on 01/14/2021 10/19/20   [provider]  cefpodoxime (VANTIN) 100 MG tablet Take 100 mg by mouth 2 (two) times daily. Patient not taking: Reported on 01/14/2021    [provider]  clopidogrel (PLAVIX) 75 MG tablet Take 1 tablet (75 mg total) by mouth daily. Patient not taking: Reported on 01/14/2021 10/27/20 10/27/21  Marty Heck, MD  ergocalciferol (VITAMIN D2) 1.25 MG (50000 UT) capsule TAKE ONE CAPSULE BY MOUTH WEEKLY FOR 12 WEEKS FOR LOW VITAMIN D Patient not taking: Reported on 01/14/2021 10/04/20   [provider]  finasteride (PROSCAR) 5 MG tablet Take 5 mg by mouth daily. Patient not taking: Reported on 01/14/2021 08/23/20   [provider]  levofloxacin (LEVAQUIN) 500 MG tablet Take 1 tablet (500 mg total) by mouth daily. Patient not taking: Reported on 01/14/2021 09/29/20   Orson Eva, MD  metFORMIN (GLUCOPHAGE) 500 MG tablet TAKE ONE TABLET BY MOUTH TWICE A DAY FOR DIABETES (ANNUAL KIDNEY FUNCTION TESTING IS NEEDED) Patient not taking: Reported on 01/14/2021 12/08/20   [provider]  mirtazapine (REMERON) 7.5 MG tablet Take 7.5 mg by mouth at bedtime. Patient not taking: Reported on 01/14/2021 09/09/20   [provider]  risperiDONE (RISPERDAL) 0.5 MG tablet Take 1 tablet (0.5 mg total) by mouth 2 (two) times daily. Patient not taking: Reported on 01/14/2021 09/29/20   Orson Eva, MD  risperiDONE (RISPERDAL) 1 MG tablet TAKE ONE-HALF TABLET BY MOUTH TWICE A DAY AS NEEDED  FOR BEHAVIOR PROBLEMS Patient not taking: Reported on  01/14/2021 10/03/20   [provider]   tamsulosin (FLOMAX) 0.4 MG CAPS capsule Take 1 capsule (0.4 mg total) by mouth daily. Patient not taking: Reported on 01/14/2021 07/26/20   Orson Eva, MD  triamcinolone ointment (KENALOG) 0.1 % Apply 1 application topically daily. Apply to foot Patient not taking: Reported on 01/14/2021    [provider]   Allergies  Allergen Reactions   Bee Venom Anaphylaxis   Codeine Other (See Comments)    incoherent  Other reaction(s): Delirium   Propoxyphene Other (See Comments)    Dizziness, "Makes me feel drunk" Other reaction(s): Dizziness   Valsartan Other (See Comments)    incoherent Other reaction(s): Delirium   Review of Systems  Unable to perform ROS: Dementia   Physical Exam Vitals and nursing note reviewed.  Constitutional:      General: He is not in acute distress.    Appearance: He is ill-appearing.  Cardiovascular:     Rate and Rhythm: Normal rate.  Pulmonary:     Effort: Pulmonary effort is normal. No tachypnea.  Musculoskeletal:     Comments: Left AKA, severe muscle wasting  Skin:    General: Skin is warm and dry.  Neurological:     Mental Status: He is alert.     Comments: Known dementia and severe hearing loss  Psychiatric:     Comments: Calm and cooperative at times    Vital Signs: BP (!) 119/92    Pulse (!) 45    Temp (!) 96.9 F (36.1 C) (Axillary)    Resp 20    Ht 6' 0.99" (1.854 m)    Wt 55.5 kg    SpO2 96%    BMI 16.15 kg/m  Pain Scale: PAINAD POSS *See Group Information*: 1-Acceptable,Awake and alert Pain Score: 0-No pain   SpO2: SpO2: 96 % O2 Device:SpO2: 96 % O2 Flow Rate: .O2 Flow Rate (L/min): 2 L/min  IO: Intake/output summary:  Intake/Output Summary (Last 24 hours) at 01/16/2021 1359 Last data filed at 01/16/2021 1128 Gross per 24 hour  Intake 3078.83 ml  Output 1200 ml  Net 1878.83 ml    LBM: Last BM Date: 01/15/21 Baseline Weight: Weight: 53.5 kg Most recent weight: Weight: 55.5 kg     Palliative  Assessment/Data:   Flowsheet Rows    Flowsheet Row Most Recent Value  Intake Tab   Referral Department Hospitalist  Unit at Time of Referral Other (Comment)  Palliative Care Primary Diagnosis Pulmonary  Date Notified 01/16/21  Palliative Care Type Return patient Palliative Care  Reason for referral Clarify Goals of Care  Date of Admission 01/13/21  Date first seen by Palliative Care 01/16/21  # of days Palliative referral response time 0 Day(s)  # of days IP prior to Palliative referral 3  Clinical Assessment   Palliative Performance Scale Score 30%  Pain Max last 24 hours Not able to report  Pain Min Last 24 hours Not able to report  Dyspnea Max Last 24 Hours Not able to report  Dyspnea Min Last 24 hours Not able to report  Psychosocial & Spiritual Assessment   Palliative Care Outcomes        Time In: 1330 Time Out: 1400 Time Total: 30 minutes  Greater than 50%  of this time was spent counseling and coordinating care related to the above assessment and plan.  Signed by: Drue Novel, NP   Please contact Palliative Medicine Team phone at [phone] for questions and concerns.  For individual provider: See Shea Evans

## 2021-01-16 NOTE — Evaluation (Signed)
Occupational Therapy Evaluation Patient Details Name: Allen Yu MRN: [id number] DOB: Jan 11, 1946 Today's Date: 01/16/2021   History of Present Illness Allen Yu  is a 75 y.o. male, with history of anemia, CKD, dementia, schizophrenia, DMII, HTN, PAF, PVD, and more presents to the ED with a chief complaint of dyspnea. Pt found to have Acute respiratory failure with hypoxia, suspected PNA, and AKI.   Clinical Impression   Pt received in bed, hand mitts donned, agreeable to OT/PT co-evaluation this am. Pt is very HOH, therefore PLOF information obtained from chart review. Pt requiring multi-modal cues to complete tasks, declining to complete ADLs at EOB however was able to maintain sitting independently. Suspect pt is close to baseline functioning, with wife assisting with all ADLs and mobility tasks. Recommend SNF on discharge to improve safety and independence in ADLs and mobility tasks.       Recommendations for follow up therapy are one component of a multi-disciplinary discharge planning process, led by the attending physician.  Recommendations may be updated based on patient status, additional functional criteria and insurance authorization.   Follow Up Recommendations  Skilled nursing-short term rehab (<3 hours/day)    Assistance Recommended at Discharge Frequent or constant Supervision/Assistance  Functional Status Assessment  Patient has had a recent decline in their functional status and/or demonstrates limited ability to make significant improvements in function in a reasonable and predictable amount of time  Equipment Recommendations  None recommended by OT       Precautions / Restrictions Precautions Precautions: Fall Restrictions Weight Bearing Restrictions: No      Mobility Bed Mobility Overal bed mobility: Needs Assistance             General bed mobility comments: Defer to PT note    Transfers                   General transfer comment: Not  completed          ADL either performed or assessed with clinical judgement   ADL Overall ADL's : Needs assistance/impaired     Grooming: Maximal assistance;Bed level Grooming Details (indicate cue type and reason): provided washcloth for pt, demonstrated, pt shaking head no         Upper Body Dressing : Maximal assistance;Bed level   Lower Body Dressing: Maximal assistance;Bed level Lower Body Dressing Details (indicate cue type and reason): max assist to donn sock   Toilet Transfer Details (indicate cue type and reason): not completed   Toileting - Clothing Manipulation Details (indicate cue type and reason): not completed       General ADL Comments: Pt requiring multi-modal cuing for following directions due to [facility], unwilling to complete ADLs     Vision Baseline Vision/History: 0 No visual deficits Patient Visual Report: Other (comment) (unable to assess) Additional Comments: unable to assess            Pertinent Vitals/Pain Pain Assessment: No/denies pain Breathing: normal Negative Vocalization: none Facial Expression: smiling or inexpressive Body Language: relaxed Consolability: no need to console PAINAD Score: 0     Hand Dominance Right   Extremity/Trunk Assessment Upper Extremity Assessment Upper Extremity Assessment: Generalized weakness   Lower Extremity Assessment Lower Extremity Assessment: Defer to PT evaluation   Cervical / Trunk Assessment Cervical / Trunk Assessment: Kyphotic   Communication Communication Communication: HOH   Cognition Arousal/Alertness: Awake/alert Behavior During Therapy: WFL for tasks assessed/performed Overall Cognitive Status: No family/caregiver present to determine baseline cognitive functioning  General Comments: Pt very HOH therefore cognition difficult to assess, was able to follow directions with multi-modal cuing                Home Living Family/patient  expects to be discharged to:: Private residence Living Arrangements: Spouse/significant other Available Help at Discharge: Family;Personal care attendant;Available 24 hours/day Type of Home: House Home Access: Ramped entrance (foldable ramp)     Home Layout: Two level;Able to live on main level with bedroom/bathroom;Other (Comment)     Bathroom Shower/Tub: Teacher, early years/pre: Standard     Home Equipment: Conservation officer, nature (2 wheels);Wheelchair - manual   Additional Comments: all information obtained from chart review, pt unable to answer questions accurately. Does report he lives with his wife      Prior Functioning/Environment Prior Level of Function : Needs assist       Physical Assist : Mobility (physical);ADLs (physical) Mobility (physical): Bed mobility;Transfers ADLs (physical): Grooming;Bathing;Dressing;Toileting;IADLs Mobility Comments: Per chart review pt uses wheelchair and slides between surfaces ADLs Comments: Wife assists with all ADLs per chart review        OT Problem List: Decreased strength;Decreased activity tolerance;Impaired balance (sitting and/or standing);Decreased knowledge of use of DME or AE;Impaired UE functional use;Decreased safety awareness      OT Treatment/Interventions: Self-care/ADL training;Therapeutic exercise;DME and/or AE instruction;Therapeutic activities;Patient/family education    OT Goals(Current goals can be found in the care plan section) Acute Rehab OT Goals Patient Stated Goal: To improve strength OT Goal Formulation: Patient unable to participate in goal setting Time For Goal Achievement: 01/30/21 Potential to Achieve Goals: Fair  OT Frequency: Min 1X/week           Co-evaluation PT/OT/SLP Co-Evaluation/Treatment: Yes Reason for Co-Treatment: Complexity of the patient's impairments (multi-system involvement);To address functional/ADL transfers   OT goals addressed during session: ADL's and self-care          End of Session    Activity Tolerance: Patient tolerated treatment well Patient left: in bed;with call bell/phone within reach;with bed alarm set  OT Visit Diagnosis: Muscle weakness (generalized) (M62.81)                Time: 7209-4709 OT Time Calculation (min): 15 min Charges:  OT General Charges $OT Visit: 1 Visit OT Evaluation $OT Eval Low Complexity: [place], OTR/L  [phone] 01/16/2021, 10:21 AM

## 2021-01-16 NOTE — Progress Notes (Signed)
PROGRESS NOTE    Allen Yu  DHR:[id number] DOB: [birth year] DOA: 01/13/2021 PCP: [name], MD    Brief Narrative:  75 year old male with a history of dementia, PTSD, paroxysmal atrial fibrillation, diabetes, hypertension, systolic dysfunction who is not on chronic diuretics.  He was brought to the hospital with progressive shortness of breath and acute respiratory failure.  Noted to be in decompensated CHF.  Repeat echocardiogram shows drop in ejection fraction of 25 to 30% with global hypokinesis.  He is noted to have elevated LFTs and acute kidney injury, likely related to cardiorenal/low output.  He has been started on intravenous diuretics.   Assessment & Plan:   Principal Problem:   Acute respiratory failure with hypoxia (HCC) Active Problems:   Type 2 diabetes mellitus with other circulatory complications (HCC)   Acute kidney injury superimposed on chronic kidney disease (HCC)   S/P AKA (above knee amputation) unilateral, left (HCC)   Unspecified atrial fibrillation (HCC)   Indwelling Foley catheter present   Peripheral vascular disease ([place])   Benign prostatic hyperplasia with urinary obstruction   Acute respiratory failure with hypoxia -Related decompensated CHF -Initially requiring 3 to 4 L of oxygen -Appears to have been weaned down to room air  Acute on chronic systolic CHF -Patient presented with volume overload -Does not appear to be on chronic diuretics -EF has further declined when compared to echocardiogram from 09/2020 -Current EF 25 to 30% -Started on intravenous Lasix -Appears to be having good urine output with Lasix 80 IV twice daily -Continue current treatments -We will request cardiology input to help optimize his regimen  Acute kidney injury -Related to cardiorenal syndrome and low output -Creatinine 2.2 on admission -Baseline creatinine of 1.1 -Current creatinine 2.0 -Continue to monitor with diuresis  Elevated liver enzymes -Likely  related to hepatic congestion from decreased cardiac output -Continue to monitor in the setting of diuresis  Type 2 diabetes -Initially presented with hyperglycemia and was started on insulin infusion -Subsequently transition to subcutaneous insulin -He was transitioned to 30 units of basal insulin, although I suspect this may be more than what he takes at home -He was subsequently hypoglycemic on 12/25 and required dextrose infusion -Overall blood sugars appear to be better today -We will discontinue dextrose and monitor blood sugars -A1c 8.8  Paroxysmal atrial fibrillation -Currently on metoprolol, heart rate stable -He is not on chronic anticoagulation -Records indicate that risks of anticoagulation were discussed with patient's family in the past and they elected not to pursue anticoagulation  Anemia, likely related to chronic kidney disease -Continue to follow hemoglobin, no signs of bleeding  BPH with chronic indwelling Foley catheter  PTSD/dementia -Continue on Risperdal -Haldol as needed since he has become agitated in the hospital in the past  Generalized weakness -Seen by PT with recommendations for skilled nursing facility -We will discussed with wife   DVT prophylaxis: heparin injection 5,000 Units Start: 01/13/21 2230 SCDs Start: 01/13/21 2219  Code Status: DNR Family Communication: Updated patient's wife 12/25 Disposition Plan: Status is: Inpatient  Remains inpatient appropriate because: Needs continued IV diuresis       Consultants:    Procedures:  Echocardiogram: EF 25 to 30% with global hypokinesis.  EF is lower as compared to echo from 09/2020  Antimicrobials:      Subjective: He is confused.  Objective: Vitals:   01/16/21 1300 01/16/21 1656 01/16/21 1700 01/16/21 1900  BP: 101/63  115/76 (!) 116/54  Pulse:    87  Resp:  [name])  26 17 (!) 25  Temp:  (!) 97.5 F (36.4 C)    TempSrc:  Oral    SpO2:   95% 99%  Weight:      Height:         Intake/Output Summary (Last 24 hours) at 01/16/2021 2045 Last data filed at 01/16/2021 1834 Gross per 24 hour  Intake 1256.98 ml  Output 2525 ml  Net -1268.02 ml   Filed Weights   01/13/21 2315 01/15/21 0500 01/16/21 0602  Weight: 54.6 kg 53.7 kg 55.5 kg    Examination:  General exam: Appears calm and comfortable  Respiratory system: Crackles at bases. Respiratory effort normal. Cardiovascular system: S1 & S2 heard, RRR. No murmurs, rubs, gallops or clicks.  Gastrointestinal system: Abdomen is nondistended, soft and nontender. No organomegaly or masses felt. Normal bowel sounds heard. Central nervous system: Alert and oriented. No focal neurological deficits. Extremities: 1+ edema bilaterally Skin: No rashes, lesions or ulcers Psychiatry: Confused    Data Reviewed: I have personally reviewed following labs and imaging studies  CBC: Recent Labs  Lab 01/13/21 1905 01/14/21 0447 01/15/21 0519 01/16/21 0423  WBC 7.4 6.7 7.2 8.2  NEUTROABS 6.1 5.2  --   --   HGB 8.8* 7.8* 8.9* 7.5*  HCT 26.6* 24.7* 26.7* 22.8*  MCV 94.3 94.3 93.4 95.4  PLT 353 297 328 725   Basic Metabolic Panel: Recent Labs  Lab 01/14/21 0447 01/14/21 0909 01/15/21 0519 01/15/21 1311 01/16/21 0423  NA 140 139 139 138 132*  K 3.7 3.7 2.9* 2.8* 3.6  CL 104 103 100 100 95*  CO2 23 24 23 23 23   GLUCOSE 124* 98 417* 81 254*  BUN 50* 53* 50* 47* 46*  CREATININE 2.18* 2.24* 2.11* 1.94* 2.00*  CALCIUM 8.5* 8.5* 8.7* 8.2* 7.8*  MG 1.6*  --  1.5*  --   --   PHOS  --   --  4.4  --   --    GFR: Estimated Creatinine Clearance: 25.1 mL/min (A) (by C-G formula based on SCr of 2 mg/dL (H)). Liver Function Tests: Recent Labs  Lab 01/13/21 1905 01/14/21 0447 01/15/21 0519  AST 83* 86* 111*  ALT 98* 93* 121*  ALKPHOS 223* 193* 198*  BILITOT 1.1 0.8 1.1  PROT 7.4 6.4* 6.9  ALBUMIN 3.6 3.1* 3.4*   No results for input(s): LIPASE, AMYLASE in the last 168 hours. No results for input(s): AMMONIA  in the last 168 hours. Coagulation Profile: No results for input(s): INR, PROTIME in the last 168 hours. Cardiac Enzymes: No results for input(s): CKTOTAL, CKMB, CKMBINDEX, TROPONINI in the last 168 hours. BNP (last 3 results) No results for input(s): PROBNP in the last 8760 hours. HbA1C: Recent Labs    01/14/21 0447  HGBA1C 8.8*   CBG: Recent Labs  Lab 01/15/21 1616 01/15/21 1928 01/16/21 0726 01/16/21 1118 01/16/21 1650  GLUCAP 125* 204* 293* 208* 130*   Lipid Profile: No results for input(s): CHOL, HDL, LDLCALC, TRIG, CHOLHDL, LDLDIRECT in the last 72 hours. Thyroid Function Tests: Recent Labs    01/14/21 0447  TSH 2.646   Anemia Panel: No results for input(s): VITAMINB12, FOLATE, FERRITIN, TIBC, IRON, RETICCTPCT in the last 72 hours. Sepsis Labs: Recent Labs  Lab 01/13/21 2231 01/14/21 0107 01/14/21 1527 01/15/21 0519  LATICACIDVEN 5.6* 4.6* 2.1* 1.9    Recent Results (from the past 240 hour(s))  Resp Panel by RT-PCR (Flu A&B, Covid) Nasopharyngeal Swab     Status: None   Collection Time:  01/13/21  7:15 PM   Specimen: Nasopharyngeal Swab; Nasopharyngeal(NP) swabs in vial transport medium  Result Value Ref Range Status   SARS Coronavirus 2 by RT PCR NEGATIVE NEGATIVE Final    Comment: (NOTE) SARS-CoV-2 target nucleic acids are NOT DETECTED.  The SARS-CoV-2 RNA is generally detectable in upper respiratory specimens during the acute phase of infection. The lowest concentration of SARS-CoV-2 viral copies this assay can detect is 138 copies/mL. A negative result does not preclude SARS-Cov-2 infection and should not be used as the sole basis for treatment or other patient management decisions. A negative result may occur with  improper specimen collection/handling, submission of specimen other than nasopharyngeal swab, presence of viral mutation(s) within the areas targeted by this assay, and inadequate number of viral copies(<138 copies/mL). A negative  result must be combined with clinical observations, patient history, and epidemiological information. The expected result is Negative.  Fact Sheet for Patients:  EntrepreneurPulse.com.au  Fact Sheet for Healthcare Providers:  IncredibleEmployment.be  This test is no t yet approved or cleared by the [place] FDA and  has been authorized for detection and/or diagnosis of SARS-CoV-2 by FDA under an Emergency Use Authorization (EUA). This EUA will remain  in effect (meaning this test can be used) for the duration of the COVID-19 declaration under Section 564(b)(1) of the Act, 21 U.S.C.section 360bbb-3(b)(1), unless the authorization is terminated  or revoked sooner.       Influenza A by PCR NEGATIVE NEGATIVE Final   Influenza B by PCR NEGATIVE NEGATIVE Final    Comment: (NOTE) The Xpert Xpress SARS-CoV-2/FLU/RSV plus assay is intended as an aid in the diagnosis of influenza from Nasopharyngeal swab specimens and should not be used as a sole basis for treatment. Nasal washings and aspirates are unacceptable for Xpert Xpress SARS-CoV-2/FLU/RSV testing.  Fact Sheet for Patients: EntrepreneurPulse.com.au  Fact Sheet for Healthcare Providers: IncredibleEmployment.be  This test is not yet approved or cleared by the [place] FDA and has been authorized for detection and/or diagnosis of SARS-CoV-2 by FDA under an Emergency Use Authorization (EUA). This EUA will remain in effect (meaning this test can be used) for the duration of the COVID-19 declaration under Section 564(b)(1) of the Act, 21 U.S.C. section 360bbb-3(b)(1), unless the authorization is terminated or revoked.  Performed at [facility], [address] [place], [place] [address]   Culture, blood (routine x 2)     Status: None (Preliminary result)   Collection Time: 01/13/21  8:25 PM   Specimen: Right Antecubital; Blood  Result Value Ref  Range Status   Specimen Description   Final    RIGHT ANTECUBITAL BOTTLES DRAWN AEROBIC AND ANAEROBIC   Special Requests Blood Culture adequate volume  Final   Culture   Final    NO GROWTH 3 DAYS Performed at [facility], [address], [place], [place] [address]    Report Status PENDING  Incomplete  Culture, blood (routine x 2)     Status: None (Preliminary result)   Collection Time: 01/13/21  8:33 PM   Specimen: BLOOD RIGHT ARM  Result Value Ref Range Status   Specimen Description BLOOD RIGHT ARM  Final   Special Requests   Final    BOTTLES DRAWN AEROBIC ONLY Blood Culture results may not be optimal due to an inadequate volume of blood received in culture bottles   Culture   Final    NO GROWTH 3 DAYS Performed at [facility], [address], [place], [place] [address]  Report Status PENDING  Incomplete  Urine Culture     Status: Abnormal   Collection Time: 01/13/21 10:04 PM   Specimen: Urine, Catheterized  Result Value Ref Range Status   Specimen Description   Final    URINE, CATHETERIZED Performed at [facility], [address] [place], [place] [address]    Special Requests   Final    NONE Performed at [facility], [address], [place], [place] [address]    Culture (A)  Final    50,000 COLONIES/mL ENTEROBACTER CLOACAE 70,000 COLONIES/mL ENTEROCOCCUS FAECALIS    Report Status 01/16/2021 FINAL  Final   Organism ID, Bacteria ENTEROBACTER CLOACAE (A)  Final   Organism ID, Bacteria ENTEROCOCCUS FAECALIS (A)  Final      Susceptibility   Enterobacter cloacae - MIC*    CEFAZOLIN RESISTANT Resistant     CEFEPIME <=0.12 SENSITIVE Sensitive     CIPROFLOXACIN 1 RESISTANT Resistant     GENTAMICIN <=1 SENSITIVE Sensitive     IMIPENEM <=0.25 SENSITIVE Sensitive     NITROFURANTOIN 64 INTERMEDIATE Intermediate     TRIMETH/SULFA >=320 RESISTANT Resistant     PIP/TAZO <=4 SENSITIVE Sensitive     * 50,000 COLONIES/mL ENTEROBACTER CLOACAE   Enterococcus faecalis - MIC*     AMPICILLIN <=2 SENSITIVE Sensitive     NITROFURANTOIN <=16 SENSITIVE Sensitive     VANCOMYCIN 1 SENSITIVE Sensitive     * 70,000 COLONIES/mL ENTEROCOCCUS FAECALIS  MRSA Next Gen by PCR, Nasal     Status: None   Collection Time: 01/14/21  2:24 PM   Specimen: Nasal Mucosa; Nasal Swab  Result Value Ref Range Status   MRSA by PCR Next Gen NOT DETECTED NOT DETECTED Final    Comment: (NOTE) The GeneXpert MRSA Assay (FDA approved for NASAL specimens only), is one component of a comprehensive MRSA colonization surveillance program. It is not intended to diagnose MRSA infection nor to guide or monitor treatment for MRSA infections. Test performance is not FDA approved in patients less than 88 years old. Performed at [facility], [address] [place], [place] [address]          Radiology Studies: No results found.      Scheduled Meds:  aspirin EC  81 mg Oral Daily   atorvastatin  40 mg Oral QHS   Chlorhexidine Gluconate Cloth  6 each Topical Daily   clopidogrel  75 mg Oral Daily   donepezil  10 mg Oral QHS   finasteride  5 mg Oral Daily   furosemide  80 mg Intravenous BID   heparin  5,000 Units Subcutaneous Q8H   insulin aspart  0-5 Units Subcutaneous QHS   insulin aspart  0-9 Units Subcutaneous TID WC   metoprolol succinate  50 mg Oral Daily   mirtazapine  15 mg Oral QHS   nicotine  21 mg Transdermal Daily   risperiDONE  1 mg Oral BID   tamsulosin  0.4 mg Oral Daily   Continuous Infusions:     LOS: 3 days    Time spent: 63mins    Kathie Dike, MD [facility]   If 7PM-7AM, please contact night-coverage [email or web address]  01/16/2021, 8:45 PM

## 2021-01-16 NOTE — Evaluation (Signed)
Physical Therapy Evaluation Patient Details Name: Allen Yu MRN: [id number] DOB: [birth year] Today's Date: 01/16/2021  History of Present Illness  Allen Yu  is a 75 y.o. male, with history of anemia, CKD, dementia, schizophrenia, DMII, HTN, PAF, PVD, LLE AKA amputation and more presents to the ED with a chief complaint of dyspnea. Pt found to have Acute respiratory failure with hypoxia, suspected PNA, and AKI.   Clinical Impression   Patient demonstrates generalized weakness and reduced functional mobility requiring increased level of physical assistance with moderate assistance for bed mobility for sitting to EOB where he can maintain balance with leg supported and UE support for 3-5 minutes.  Sit to stand attempted with RLE and able to achieve partial squat by EOB requiring max A and unable to maintain any length of time requiring total assistance for squat-pivot from EOB to chair.  Pt return to bed in supine without adverse effects.  Continued PT services indicated while hospitalized to improve strength, activity tolerance, and functional mobility. Recommend SNF at discharge to continue with physical rehabilitation to restore capabilities to PLOF.        Recommendations for follow up therapy are one component of a multi-disciplinary discharge planning process, led by the attending physician.  Recommendations may be updated based on patient status, additional functional criteria and insurance authorization.  Follow Up Recommendations Skilled nursing-short term rehab (<3 hours/day)    Assistance Recommended at Discharge Frequent or constant Supervision/Assistance  Functional Status Assessment Patient has had a recent decline in their functional status and demonstrates the ability to make significant improvements in function in a reasonable and predictable amount of time.  Equipment Recommendations  None recommended by PT    Recommendations for Other Services       Precautions /  Restrictions Precautions Precautions: Fall Restrictions Weight Bearing Restrictions: No      Mobility  Bed Mobility Overal bed mobility: Needs Assistance Bed Mobility: Supine to Sit;Sit to Supine     Supine to sit: Mod assist Sit to supine: Mod assist   General bed mobility comments: Defer to PT note Patient Response: Flat affect  Transfers Overall transfer level: Needs assistance Equipment used: 1 person hand held assist Transfers: Sit to/from Stand Sit to Stand: Max assist           General transfer comment: partial squat with RLE    Ambulation/Gait                  Stairs            Wheelchair Mobility    Modified Rankin (Stroke Patients Only)       Balance Overall balance assessment: Needs assistance Sitting-balance support: Single extremity supported Sitting balance-Leahy Scale: Fair       Standing balance-Leahy Scale: Zero                               Pertinent Vitals/Pain Pain Assessment: No/denies pain Breathing: normal Negative Vocalization: none Facial Expression: smiling or inexpressive Body Language: relaxed Consolability: no need to console PAINAD Score: 0    Home Living Family/patient expects to be discharged to:: Private residence Living Arrangements: Spouse/significant other Available Help at Discharge: Family;Personal care attendant;Available 24 hours/day Type of Home: House Home Access: Ramped entrance (foldable ramp)     Alternate Level Stairs-Number of Steps: full flight Home Layout: Two level;Able to live on main level with bedroom/bathroom;Other (Comment) Home Equipment: Rolling Walker (2 wheels);Wheelchair -  manual Additional Comments: all information obtained from chart review, pt unable to answer questions accurately. Does report he lives with his wife    Prior Function Prior Level of Function : Needs assist       Physical Assist : Mobility (physical);ADLs (physical) Mobility  (physical): Bed mobility;Transfers ADLs (physical): Grooming;Bathing;Dressing;Toileting;IADLs Mobility Comments: Per chart review pt uses wheelchair and slides between surfaces ADLs Comments: Wife assists with all ADLs per chart review     Hand Dominance   Dominant Hand: Right    Extremity/Trunk Assessment   Upper Extremity Assessment Upper Extremity Assessment: Generalized weakness    Lower Extremity Assessment Lower Extremity Assessment: Generalized weakness    Cervical / Trunk Assessment Cervical / Trunk Assessment: Kyphotic  Communication   Communication: HOH  Cognition Arousal/Alertness: Awake/alert Behavior During Therapy: WFL for tasks assessed/performed Overall Cognitive Status: No family/caregiver present to determine baseline cognitive functioning                                 General Comments: Pt very HOH therefore cognition difficult to assess, was able to follow directions with multi-modal cuing        General Comments      Exercises     Assessment/Plan    PT Assessment Patient needs continued PT services  PT Problem List Decreased strength;Decreased activity tolerance;Decreased balance;Decreased cognition;Decreased mobility;Decreased safety awareness;Cardiopulmonary status limiting activity       PT Treatment Interventions DME instruction;Gait training;Functional mobility training;Therapeutic activities;Patient/family education;Neuromuscular re-education;Balance training;Therapeutic exercise;Wheelchair mobility training;Manual techniques    PT Goals (Current goals can be found in the Care Plan section)  Acute Rehab PT Goals Patient Stated Goal: decrease level of assistance for PLOF PT Goal Formulation: With patient Time For Goal Achievement: 01/23/21 Potential to Achieve Goals: Fair    Frequency Min 3X/week   Barriers to discharge Decreased caregiver support      Co-evaluation PT/OT/SLP Co-Evaluation/Treatment: Yes Reason  for Co-Treatment: Complexity of the patient's impairments (multi-system involvement);To address functional/ADL transfers PT goals addressed during session: Mobility/safety with mobility;Balance OT goals addressed during session: ADL's and self-care       AM-PAC PT "6 Clicks" Mobility  Outcome Measure Help needed turning from your back to your side while in a flat bed without using bedrails?: A Little Help needed moving from lying on your back to sitting on the side of a flat bed without using bedrails?: A Little Help needed moving to and from a bed to a chair (including a wheelchair)?: A Lot Help needed standing up from a chair using your arms (e.g., wheelchair or bedside chair)?: Total Help needed to walk in hospital room?: Total Help needed climbing 3-5 steps with a railing? : Total 6 Click Score: 11    End of Session   Activity Tolerance: Patient tolerated treatment well;Patient limited by fatigue Patient left: in bed;with call bell/phone within reach;with bed alarm set Nurse Communication: Mobility status PT Visit Diagnosis: Muscle weakness (generalized) (M62.81);Other abnormalities of gait and mobility (R26.89)    Time: 1607-3710 PT Time Calculation (min) (ACUTE ONLY): 15 min   Charges:   PT Evaluation $PT Eval Low Complexity: 1 Low         11:18 AM, 01/16/21 M. Sherlyn Lees, PT, DPT Physical Therapist- [place] Office Number: [phone]

## 2021-01-17 ENCOUNTER — Encounter (HOSPITAL_COMMUNITY): Payer: Self-pay | Admitting: Family Medicine

## 2021-01-17 DIAGNOSIS — N179 Acute kidney failure, unspecified: Secondary | ICD-10-CM | POA: Diagnosis not present

## 2021-01-17 DIAGNOSIS — N401 Enlarged prostate with lower urinary tract symptoms: Secondary | ICD-10-CM | POA: Diagnosis not present

## 2021-01-17 DIAGNOSIS — Z978 Presence of other specified devices: Secondary | ICD-10-CM | POA: Diagnosis not present

## 2021-01-17 DIAGNOSIS — I5043 Acute on chronic combined systolic (congestive) and diastolic (congestive) heart failure: Secondary | ICD-10-CM

## 2021-01-17 DIAGNOSIS — J9601 Acute respiratory failure with hypoxia: Secondary | ICD-10-CM | POA: Diagnosis not present

## 2021-01-17 DIAGNOSIS — I739 Peripheral vascular disease, unspecified: Secondary | ICD-10-CM

## 2021-01-17 DIAGNOSIS — Z89612 Acquired absence of left leg above knee: Secondary | ICD-10-CM

## 2021-01-17 LAB — COMPREHENSIVE METABOLIC PANEL
ALT: 79 U/L — ABNORMAL HIGH (ref 0–44)
AST: 60 U/L — ABNORMAL HIGH (ref 15–41)
Albumin: 3 g/dL — ABNORMAL LOW (ref 3.5–5.0)
Alkaline Phosphatase: 174 U/L — ABNORMAL HIGH (ref 38–126)
Anion gap: 10 (ref 5–15)
BUN: 44 mg/dL — ABNORMAL HIGH (ref 8–23)
CO2: 27 mmol/L (ref 22–32)
Calcium: 8.3 mg/dL — ABNORMAL LOW (ref 8.9–10.3)
Chloride: 99 mmol/L (ref 98–111)
Creatinine, Ser: 1.82 mg/dL — ABNORMAL HIGH (ref 0.61–1.24)
GFR, Estimated: 38 mL/min — ABNORMAL LOW (ref >60)
Glucose, Bld: 229 mg/dL — ABNORMAL HIGH (ref 70–99)
Potassium: 3.6 mmol/L (ref 3.5–5.1)
Sodium: 136 mmol/L (ref 135–145)
Total Bilirubin: 1.2 mg/dL (ref 0.3–1.2)
Total Protein: 6 g/dL — ABNORMAL LOW (ref 6.5–8.1)

## 2021-01-17 LAB — GLUCOSE, CAPILLARY
Glucose-Capillary: 216 mg/dL — ABNORMAL HIGH (ref 70–99)
Glucose-Capillary: 134 mg/dL — ABNORMAL HIGH (ref 70–99)
Glucose-Capillary: 224 mg/dL — ABNORMAL HIGH (ref 70–99)
Glucose-Capillary: 284 mg/dL — ABNORMAL HIGH (ref 70–99)

## 2021-01-17 NOTE — Consult Note (Signed)
Cardiology Consult    Patient ID: Allen Yu MRN: [id number], DOB/AGE: 1945-06-08   Admit date: 01/13/2021 Date of Consult: 01/17/2021  Primary Physician: [name], MD Primary Cardiologist: Rozann Lesches, MD Requesting Provider: Lenna Sciara. Roderic Palau, MD  Patient Profile    Allen Yu is a 75 y.o. 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 Roderic Palau.  Past Medical History   Past Medical History:  Diagnosis Date   Anemia    Arthritis    Carotid stenosis    Chronic back pain    Chronic HFrEF (heart failure with reduced ejection fraction) ([place])    a. 09/2020 Echo: EF 45%, mild LVH; b. 12/2020 Echo: EF 25-30%, glob HK, mod LVH, mildly reduced RV fxn, RVSP 65.37mmHg, mod BAE. Triv effusion. Mod MR/TR.   CKD (chronic kidney disease), stage III ([place])    Constipation    Dementia ([place])    Diabetes mellitus    Dilated cardiomyopathy ([place])    a. 09/2020 Echo: EF 45%; b. 12/2020 Echo: EF 25-30%.   GERD (gastroesophageal reflux disease)    History of kidney stones    Hypertension    Lung nodule    PAF (paroxysmal atrial fibrillation) ([facility])    Peripheral neuropathy    Peripheral vascular disease ([place])    a. 06/2020 s/p L AKA; b. 10/2020 s/p R SFA and above-knee popliteal PTA.   PTSD (post-traumatic stress disorder)    Schizophrenia ([place])    Tuberculosis    Treated    Past Surgical History:  Procedure Laterality Date   ABDOMINAL AORTOGRAM W/LOWER EXTREMITY N/A 05/20/2020   Procedure: ABDOMINAL AORTOGRAM W/LOWER EXTREMITY;  Surgeon: Elam Dutch, MD;  Location: [facility];  Service: Cardiovascular;  Laterality: N/A;   ABDOMINAL AORTOGRAM W/LOWER EXTREMITY N/A 10/27/2020   Procedure: ABDOMINAL AORTOGRAM W/LOWER EXTREMITY;  Surgeon: Marty Heck, MD;  Location: [facility];  Service: Cardiovascular;  Laterality: N/A;   AMPUTATION Left 06/28/2020   Procedure: AMPUTATION ABOVE KNEE LEFT;  Surgeon: Rosetta Posner, MD;  Location: [facility];  Service: Vascular;  Laterality: Left;   [place], 2013   x2   BLADDER SURGERY     02   CHOLECYSTECTOMY     COLONOSCOPY     COLONOSCOPY N/A 12/07/2014   Procedure: COLONOSCOPY;  Surgeon: Daneil Dolin, MD;  Location: [facility];  Service: Endoscopy;  Laterality: N/A;  10:30 Am   EYE SURGERY Bilateral    removed metal from eye   FEMORAL-TIBIAL BYPASS GRAFT Left 05/27/2020   Procedure: LEFT FEMORAL TO PERONEAL ARTERY BYPASS;  Surgeon: Rosetta Posner, MD;  Location: [place];  Service: Vascular;  Laterality: Left;   HERNIA REPAIR Right    INGUINAL HERNIA REPAIR Left 11/03/2013   Procedure: HERNIA REPAIR INGUINAL ADULT;  Surgeon: Gayland Curry, MD;  Location: [place];  Service: General;  Laterality: Left;   PERIPHERAL VASCULAR INTERVENTION  10/27/2020   Procedure: PERIPHERAL VASCULAR INTERVENTION;  Surgeon: Marty Heck, MD;  Location: [facility] [facility];  Service: Cardiovascular;;   SHOULDER SURGERY     RIGHT SHOULDER    VIDEO BRONCHOSCOPY WITH ENDOBRONCHIAL NAVIGATION N/A 07/10/2019   Procedure: VIDEO BRONCHOSCOPY WITH ENDOBRONCHIAL NAVIGATION;  Surgeon: Melrose Nakayama, MD;  Location: [facility];  Service: Thoracic;  Laterality: N/A;     Allergies  Allergies  Allergen Reactions   Bee Venom Anaphylaxis   Codeine Other (See Comments)    incoherent  Other reaction(s): Delirium   Propoxyphene Other (See Comments)    Dizziness, "Makes me feel drunk" Other reaction(s): Dizziness   Valsartan Other (See Comments)    incoherent Other reaction(s): Delirium    History of Present Illness    75 y.o. male with a history of PAD s/p L AKA 06/2020, paroxysmal Afib (dx 07/2020), HFmrEF, HTN, DMII, PTSD, schizophrenia, dementia, PTSD, and CKD III.  He was prev eval by cardiology in 08/2020 in the setting  of admission for Klebsiella urosepsis complicated by Afib w/ RVR w/ conversion to sinus rhythm on amio.  This was limited to a short course in the outpt setting and after discussion w/ wife/family decision was made to forego oral anticoagulation.  He had recurrent afib in the setting of sepsis, PNA, and epididymitis in September 2022.  Echo during admission showed an EF of 45% w/ inf HK, which was new compared to imaging in June 2022.  He was subsequently discharged.  He required repeat vascular intervention October 6 with right SFA and above-the-knee popliteal PTA performed by Dr. Carlis Abbott.  Mr. Canelo was last seen in cardiology clinic on November 8, at which time he was doing well and was noted to be euvolemic.  His short acting metoprolol was transitioned to metoprolol succinate with plan to add ACE inhibitor in the future (history of delirium on ARB's).  Again, his wife reiterated a desire to forego oral anticoagulation.  Patient was in his usual state of health until Thursday, December 22, when his wife noted that he seemed to be more short of breath, that persisted throughout the day and worsened slightly on December 23, prompting ED evaluation.  In the emergency department, he was initially hypoxic on room air with a saturation as low as 75%.  This normalized with supplemental oxygen.  Lactic acid was initially greater than 6 with a follow-up value of 4.6.  Creatinine was elevated above baseline at 2.21 (1.10 in 10/2020), with a glucose of 319.  BNP was 2873.  Initial troponin was 63 with a follow-up value of 56.  AST and ALT were elevated at 86 and 93 respectively with an alkaline phosphatase of 193.  Chest x-ray showed interstitial lung markings with mild bilateral, left greater than right lower base infiltrates.  He was placed on vancomycin, Flagyl, cefepime, intravenous Lasix, and insulin infusion.  Echocardiogram was performed on December 24 showing an EF of 25 to 30% with global hypokinesis, moderate  LVH, RVSP of 65.7 mmHg, moderately dilated atria, and moderate MR/TR.  With diuresis, creatinine has improved slightly and is 122 this morning.  He is -2.6 L since admission and is lying flat this morning.  Currently, he is somnolent and unable to participate in interview.  I did contact his wife via telephone and she provided background history up to this point.  She prefers a conservative approach regarding management and work-up of cardiomyopathy.  Inpatient Medications     aspirin EC  81 mg Oral Daily   atorvastatin  40 mg Oral QHS   Chlorhexidine Gluconate Cloth  6 each Topical Daily   clopidogrel  75 mg Oral Daily   donepezil  10 mg Oral QHS   finasteride  5 mg Oral Daily   furosemide  80 mg Intravenous BID   heparin  5,000  Units Subcutaneous Q8H   insulin aspart  0-5 Units Subcutaneous QHS   insulin aspart  0-9 Units Subcutaneous TID WC   metoprolol succinate  50 mg Oral Daily   mirtazapine  15 mg Oral QHS   nicotine  21 mg Transdermal Daily   risperiDONE  1 mg Oral BID   tamsulosin  0.4 mg Oral Daily    Family History-obtained from [place] link as patient is somnolent    Family History  Problem Relation Age of Onset   Heart disease Mother        before age 6   He indicated that his mother is deceased. He indicated that his father is deceased.   Social History-obtained from [employer] health link as patient is somnolent    Social History   Socioeconomic History   Marital status: Married    Spouse name: [name]   Number of children: Not on file   Years of education: Not on file   Highest education level: Not on file  Occupational History   Not on file  Tobacco Use   Smoking status: Every Day    Packs/day: 0.50    Types: Cigarettes   Smokeless tobacco: Never   Tobacco comments:    burns them up  Vaping Use   Vaping Use: Never used  Substance and Sexual Activity   Alcohol use: No    Alcohol/week: 0.0 standard drinks   Drug use: Yes    Types: Marijuana     Comment: almost daily for pain   Sexual activity: Never  Other Topics Concern   Not on file  Social History Narrative   Not on file   Social Determinants of Health   Financial Resource Strain: Low Risk    Difficulty of Paying Living Expenses: Not hard at all  Food Insecurity: No Food Insecurity   Worried About Charity fundraiser in the Last Year: Never true   Ran Out of Food in the Last Year: Never true  Transportation Needs: No Transportation Needs   Lack of Transportation (Medical): No   Lack of Transportation (Non-Medical): No  Physical Activity: Not on file  Stress: No Stress Concern Present   Feeling of Stress : Only a little  Social Connections: Engineer, building services of Communication with Friends and Family: More than three times a week   Frequency of Social Gatherings with Friends and Family: More than three times a week   Attends Religious Services: 1 to 4 times per year   Active Member of [employer] or Organizations: Yes   Attends Archivist Meetings: 1 to 4 times per year   Marital Status: Married  Human resources officer Violence: Not At Risk   Fear of Current or Ex-Partner: No   Emotionally Abused: No   Physically Abused: No   Sexually Abused: No     Review of Systems-patient somnolent and unable to provide complete review of systems    Per notes, patient had dyspnea and some degree of altered mental status on admission.  Physical Exam    Blood pressure 120/64, pulse 72, temperature (!) 95.7 F (35.4 C), temperature source Rectal, resp. rate (!) 22, height 6' 0.99" (1.854 m), weight 55.5 kg, SpO2 100 %.  General: Somnolent.  Does not open eyes to verbal or tactile stimuli. Psych: Somnolent Neuro: Somnolent.  Does not follow commands. HEENT: Normal  Neck: Supple without bruits or JVD. Lungs:  Resp regular and unlabored, diminished breath sounds at bilateral bases. Heart: RRR, distant,  no s3, s4, or murmurs. Abdomen: Soft, non-tender, non-distended,  BS + x 4.  Extremities: No clubbing, cyanosis.  Radials 2+ and equal bilaterally.  Left AKA.  No lower extremity edema.  Labs    Cardiac Enzymes Recent Labs  Lab 01/13/21 1905 01/13/21 2034  TROPONINIHS 63* 56*      Lab Results  Component Value Date   WBC 8.2 01/16/2021   HGB 7.5 (L) 01/16/2021   HCT 22.8 (L) 01/16/2021   MCV 95.4 01/16/2021   PLT 265 01/16/2021    Recent Labs  Lab 01/17/21 0409  NA 136  K 3.6  CL 99  CO2 27  BUN 44*  CREATININE 1.82*  CALCIUM 8.3*  PROT 6.0*  BILITOT 1.2  ALKPHOS 174*  ALT 79*  AST 60*  GLUCOSE 229*   Lab Results  Component Value Date   CHOL 114 05/28/2020   HDL 41 05/28/2020   LDLCALC 63 05/28/2020   TRIG 49 05/28/2020   Lab Results  Component Value Date   DDIMER 2.88 (H) 06/29/2020     Radiology Studies    DG Chest Portable 1 View  Result Date: 01/13/2021 CLINICAL DATA:  Shortness of breath. EXAM: PORTABLE CHEST 1 VIEW COMPARISON:  September 27, 2020 FINDINGS: Stable moderate to marked severity diffuse chronic appearing increased interstitial lung markings are seen. Mild bilateral lower lobe infiltrates are seen, left greater than right. There is a small left pleural effusion. No pneumothorax is identified. The cardiac silhouette is enlarged and unchanged in size. There is moderate severity calcification of the aortic arch. A radiopaque fusion plate and screws are seen overlying the cervical spine. IMPRESSION: 1. Stable moderate to marked severity diffuse chronic appearing increased interstitial lung markings with mild bilateral lower lobe infiltrates, left greater than right. 2. Small left pleural effusion. Electronically Signed   By: Virgina Norfolk M.D.   On: 01/13/2021 19:10   ECG & Cardiac Imaging    Regular sinus rhythm, 79, PVC, baseline artifact with nonspecific ST-T changes, as well as QT prolongation- personally reviewed.  Telemetry: Sinus rhythm to sinus tachycardia with frequent PACs and occasional  PVCs  Assessment & Plan    1.  Acute on chronic heart failure with reduced ejection fraction/dilated cardiomyopathy: Patient with a history of paroxysmal atrial fibrillation and prior echo in September 2022 showing an EF of 45% in the setting of A. fib.  He has been medically managed as an outpatient on beta-blocker therapy with future plans to add ACE inhibitor (intolerance to ARB's-delirium).  Unfortunately, he was readmitted December 24 with a 24-hour history of dyspnea.  In the ED, his BNP was elevated at 2873.  Troponins were mildly elevated at 63 and 56.  Creatinine was elevated above baseline at 2.21.  Chest x-ray showed mild bilateral lower lobe infiltrates-left greater than right, and small left effusion.  Repeat echocardiogram on December 24 showing worsening of LV function-now measured at 25 to 30% with global hypokinesis, severely elevated RVSP of 65.7 mmHg, and moderate MR/TR.  Patient is somnolent this morning, which his wife notes is common if he has been receiving risperidone.  He is in no acute distress and able to lie flat.  On exam, he appears euvolemic.  With diuresis, his BUN has been stable at 44 and creatinine has improved to 1.82.  Bicarb has risen from 23-27 this morning.  LFTs have come down in the setting of diuresis suggesting that abnormalities may have been secondary to hepatic congestion/CHF.  I suspect that provided  he is alert enough to take orals, he can be transitioned to Lasix 40 mg p.o. twice daily.  As noted above, I have spoken to his wife and she continues to prefer conservative management.  She notes that he has been through a lot over the past several months and is not interested in invasive evaluation such as diagnostic catheterization.  Continue current dose of metoprolol.  Provided that renal function stable, we may wish to consider an ACE inhibitor versus hydralazine/nitrate if pressure allows.  He was previously felt to be a poor candidate for SGLT2 inhibitor in the  setting of malnutrition with a BMI of 16, and poor p.o. intake at times.  2.  Paroxysmal atrial fibrillation: Previously diagnosed in July and managed with a short course of amiodarone.  He is currently in sinus rhythm and review of telemetry shows sinus with frequent PACs and PVCs.  Continue beta-blocker therapy.  He is not on oral anticoagulation based on family's wishes for conservative management and avoidance of oral anticoagulant.  3.  Essential hypertension: Stable on beta-blocker therapy.    4.  Peripheral arterial disease: Status post left AKA and subsequent PTA of the left lower extremity.  Per wife, he has not been having any lower extremity pain and remains on aspirin, Plavix, and statin therapy.  5.  Elevated troponin/demand ischemia: Mild troponin elevation to a peak of 63 on admission in the setting of 1.  As above, conservative therapy with aspirin, statin, and beta-blocker therapy.  6.  Anemia of chronic disease: H&H low at 7.5 and 22.8 yesterday.  Baseline hemoglobin is around 8-9.  Follow on dual antiplatelet therapy.  7.  Type 2 diabetes mellitus: Noncompliant with insulin at home.  Management per primary team.  8.  Dementia: Patient somnolent this morning.  He has been receiving risperidone and in discussion with his wife, it appears that she uses on an infrequent an as-needed basis at home because it generally resulted in somnolence.  9.  Stage III chronic kidney disease: Creatinine was normal back in October but has been elevated in the setting of admission for heart failure.  Improved slightly with diuresis.  Follow.  Avoid nephrotoxic agents.  Signed, Murray Hodgkins, NP 01/17/2021, 10:36 AM  For questions or updates, please contact   Please consult [email or web address] for contact info under Cardiology/STEMI.

## 2021-01-17 NOTE — Progress Notes (Signed)
PROGRESS NOTE    Allen Yu  QBH:[id number] DOB: 09/06/1945 DOA: 01/13/2021 PCP: [name], MD    Brief Narrative:  75 year old male with a history of dementia, PTSD, paroxysmal atrial fibrillation, diabetes, hypertension, systolic dysfunction who is not on chronic diuretics.  He was brought to the hospital with progressive shortness of breath and acute respiratory failure.  Noted to be in decompensated CHF.  Repeat echocardiogram shows drop in ejection fraction of 25 to 30% with global hypokinesis.  He is noted to have elevated LFTs and acute kidney injury, likely related to cardiorenal/low output.  He has been started on intravenous diuretics.   Assessment & Plan:   Principal Problem:   Acute respiratory failure with hypoxia (HCC) Active Problems:   Type 2 diabetes mellitus with other circulatory complications (HCC)   Acute kidney injury superimposed on chronic kidney disease (HCC)   S/P AKA (above knee amputation) unilateral, left (HCC)   Unspecified atrial fibrillation (HCC)   Indwelling Foley catheter present   Peripheral vascular disease ([place])   Benign prostatic hyperplasia with urinary obstruction   Acute respiratory failure with hypoxia -Related decompensated CHF -Initially requiring 3 to 4 L of oxygen -Currently on 2 L of oxygen  Acute on chronic systolic CHF -Patient presented with volume overload -Does not appear to be on chronic diuretics -EF has further declined when compared to echocardiogram from 09/2020 -Current EF 25 to 30% -Started on intravenous Lasix -Appears to be having good urine output with Lasix 80 IV twice daily -Continue current treatments -Appreciate cardiology assistance  Acute kidney injury -Related to cardiorenal syndrome and low output -Creatinine 2.2 on admission -Baseline creatinine of 1.1 -Current creatinine 1.82 -Continue to monitor with diuresis  Elevated liver enzymes -Likely related to hepatic congestion from decreased  cardiac output -Appear to be improving with diuresis  Type 2 diabetes -Initially presented with hyperglycemia and was started on insulin infusion -Subsequently transition to subcutaneous insulin -He was transitioned to 30 units of basal insulin, although I suspect this may be more than what he takes at home -He was subsequently hypoglycemic on 12/25 and required dextrose infusion -Dextrose infusion discontinued on 12/26 and he has not had any further hypoglycemia -A1c 8.8 -Continue to monitor blood sugars and if he sustains elevated blood sugars, can consider resuming basal insulin.  Paroxysmal atrial fibrillation -Currently on metoprolol, heart rate stable -He is not on chronic anticoagulation -Records indicate that risks of anticoagulation were discussed with patient's family in the past and they elected not to pursue anticoagulation  Anemia, likely related to chronic kidney disease -Baseline hemoglobin appears to run between 7-8 -Continue to follow hemoglobin, no signs of bleeding  BPH with chronic indwelling Foley catheter  PTSD/dementia -Continue on Risperdal -Haldol as needed since he has become agitated in the hospital in the past  Generalized weakness -Seen by PT with recommendations for skilled nursing facility -Discussed with his wife, she wishes to take him home with home health services.  Left upper extremity swelling -Check venous Dopplers   DVT prophylaxis: heparin injection 5,000 Units Start: 01/13/21 2230 SCDs Start: 01/13/21 2219  Code Status: DNR Family Communication: Updated patient's wife 12/27 Disposition Plan: Status is: Inpatient  Remains inpatient appropriate because: Needs continued IV diuresis       Consultants:  Cardiology Palliative care  Procedures:  Echocardiogram: EF 25 to 30% with global hypokinesis.  EF is lower as compared to echo from 09/2020  Antimicrobials:      Subjective: He remains confused.  Staff  reports that he has  better p.o. intake today.  Objective: Vitals:   01/17/21 1200 01/17/21 1300 01/17/21 1400 01/17/21 1609  BP: 125/65 117/86 104/81   Pulse: (!) 44 79 (!) 120 78  Resp: 20 19 18  (!) 21  Temp:    (!) 97.2 F (36.2 C)  TempSrc:    Axillary  SpO2: 100% 90% (!) 55% 100%  Weight:      Height:        Intake/Output Summary (Last 24 hours) at 01/17/2021 1933 Last data filed at 01/17/2021 1813 Gross per 24 hour  Intake 420 ml  Output 3700 ml  Net -3280 ml   Filed Weights   01/13/21 2315 01/15/21 0500 01/16/21 0602  Weight: 54.6 kg 53.7 kg 55.5 kg    Examination:  General exam: Appears calm and comfortable  Respiratory system: Crackles at bases. Respiratory effort normal. Cardiovascular system: S1 & S2 heard, RRR. No murmurs, rubs, gallops or clicks.  Gastrointestinal system: Abdomen is nondistended, soft and nontender. No organomegaly or masses felt. Normal bowel sounds heard. Central nervous system: Alert and oriented. No focal neurological deficits. Extremities: No pedal edema bilaterally, he does have some edema in the left upper extremity Skin: No rashes, lesions or ulcers Psychiatry: Confused    Data Reviewed: I have personally reviewed following labs and imaging studies  CBC: Recent Labs  Lab 01/13/21 1905 01/14/21 0447 01/15/21 0519 01/16/21 0423  WBC 7.4 6.7 7.2 8.2  NEUTROABS 6.1 5.2  --   --   HGB 8.8* 7.8* 8.9* 7.5*  HCT 26.6* 24.7* 26.7* 22.8*  MCV 94.3 94.3 93.4 95.4  PLT 353 297 328 767   Basic Metabolic Panel: Recent Labs  Lab 01/14/21 0447 01/14/21 0909 01/15/21 0519 01/15/21 1311 01/16/21 0423 01/17/21 0409  NA 140 139 139 138 132* 136  K 3.7 3.7 2.9* 2.8* 3.6 3.6  CL 104 103 100 100 95* 99  CO2 23 24 23 23 23 27   GLUCOSE 124* 98 417* 81 254* 229*  BUN 50* 53* 50* 47* 46* 44*  CREATININE 2.18* 2.24* 2.11* 1.94* 2.00* 1.82*  CALCIUM 8.5* 8.5* 8.7* 8.2* 7.8* 8.3*  MG 1.6*  --  1.5*  --   --   --   PHOS  --   --  4.4  --   --   --     GFR: Estimated Creatinine Clearance: 27.5 mL/min (A) (by C-G formula based on SCr of 1.82 mg/dL (H)). Liver Function Tests: Recent Labs  Lab 01/13/21 1905 01/14/21 0447 01/15/21 0519 01/17/21 0409  AST 83* 86* 111* 60*  ALT 98* 93* 121* 79*  ALKPHOS 223* 193* 198* 174*  BILITOT 1.1 0.8 1.1 1.2  PROT 7.4 6.4* 6.9 6.0*  ALBUMIN 3.6 3.1* 3.4* 3.0*   No results for input(s): LIPASE, AMYLASE in the last 168 hours. No results for input(s): AMMONIA in the last 168 hours. Coagulation Profile: No results for input(s): INR, PROTIME in the last 168 hours. Cardiac Enzymes: No results for input(s): CKTOTAL, CKMB, CKMBINDEX, TROPONINI in the last 168 hours. BNP (last 3 results) No results for input(s): PROBNP in the last 8760 hours. HbA1C: No results for input(s): HGBA1C in the last 72 hours.  CBG: Recent Labs  Lab 01/16/21 1650 01/16/21 2118 01/17/21 0732 01/17/21 1100 01/17/21 1612  GLUCAP 130* 192* 216* 134* 224*   Lipid Profile: No results for input(s): CHOL, HDL, LDLCALC, TRIG, CHOLHDL, LDLDIRECT in the last 72 hours. Thyroid Function Tests: No results for input(s): TSH, T4TOTAL,  FREET4, T3FREE, THYROIDAB in the last 72 hours.  Anemia Panel: No results for input(s): VITAMINB12, FOLATE, FERRITIN, TIBC, IRON, RETICCTPCT in the last 72 hours. Sepsis Labs: Recent Labs  Lab 01/13/21 2231 01/14/21 0107 01/14/21 1527 01/15/21 0519  LATICACIDVEN 5.6* 4.6* 2.1* 1.9    Recent Results (from the past 240 hour(s))  Resp Panel by RT-PCR (Flu A&B, Covid) Nasopharyngeal Swab     Status: None   Collection Time: 01/13/21  7:15 PM   Specimen: Nasopharyngeal Swab; Nasopharyngeal(NP) swabs in vial transport medium  Result Value Ref Range Status   SARS Coronavirus 2 by RT PCR NEGATIVE NEGATIVE Final    Comment: (NOTE) SARS-CoV-2 target nucleic acids are NOT DETECTED.  The SARS-CoV-2 RNA is generally detectable in upper respiratory specimens during the acute phase of infection.  The lowest concentration of SARS-CoV-2 viral copies this assay can detect is 138 copies/mL. A negative result does not preclude SARS-Cov-2 infection and should not be used as the sole basis for treatment or other patient management decisions. A negative result may occur with  improper specimen collection/handling, submission of specimen other than nasopharyngeal swab, presence of viral mutation(s) within the areas targeted by this assay, and inadequate number of viral copies(<138 copies/mL). A negative result must be combined with clinical observations, patient history, and epidemiological information. The expected result is Negative.  Fact Sheet for Patients:  EntrepreneurPulse.com.au  Fact Sheet for Healthcare Providers:  IncredibleEmployment.be  This test is no t yet approved or cleared by the [place] FDA and  has been authorized for detection and/or diagnosis of SARS-CoV-2 by FDA under an Emergency Use Authorization (EUA). This EUA will remain  in effect (meaning this test can be used) for the duration of the COVID-19 declaration under Section 564(b)(1) of the Act, 21 U.S.C.section 360bbb-3(b)(1), unless the authorization is terminated  or revoked sooner.       Influenza A by PCR NEGATIVE NEGATIVE Final   Influenza B by PCR NEGATIVE NEGATIVE Final    Comment: (NOTE) The Xpert Xpress SARS-CoV-2/FLU/RSV plus assay is intended as an aid in the diagnosis of influenza from Nasopharyngeal swab specimens and should not be used as a sole basis for treatment. Nasal washings and aspirates are unacceptable for Xpert Xpress SARS-CoV-2/FLU/RSV testing.  Fact Sheet for Patients: EntrepreneurPulse.com.au  Fact Sheet for Healthcare Providers: IncredibleEmployment.be  This test is not yet approved or cleared by the [place] FDA and has been authorized for detection and/or diagnosis of SARS-CoV-2 by FDA  under an Emergency Use Authorization (EUA). This EUA will remain in effect (meaning this test can be used) for the duration of the COVID-19 declaration under Section 564(b)(1) of the Act, 21 U.S.C. section 360bbb-3(b)(1), unless the authorization is terminated or revoked.  Performed at [facility], [address], [place], [place] [address]   Culture, blood (routine x 2)     Status: None (Preliminary result)   Collection Time: 01/13/21  8:25 PM   Specimen: Right Antecubital; Blood  Result Value Ref Range Status   Specimen Description   Final    RIGHT ANTECUBITAL BOTTLES DRAWN AEROBIC AND ANAEROBIC   Special Requests Blood Culture adequate volume  Final   Culture   Final    NO GROWTH 3 DAYS Performed at [facility], [address] [place], [place] [address]    Report Status PENDING  Incomplete  Culture, blood (routine x 2)     Status: None (Preliminary result)   Collection Time: 01/13/21  8:33 PM   Specimen: BLOOD  RIGHT ARM  Result Value Ref Range Status   Specimen Description BLOOD RIGHT ARM  Final   Special Requests   Final    BOTTLES DRAWN AEROBIC ONLY Blood Culture results may not be optimal due to an inadequate volume of blood received in culture bottles   Culture   Final    NO GROWTH 3 DAYS Performed at [facility], [address], [place], [place] [address]    Report Status PENDING  Incomplete  Urine Culture     Status: Abnormal   Collection Time: 01/13/21 10:04 PM   Specimen: Urine, Catheterized  Result Value Ref Range Status   Specimen Description   Final    URINE, CATHETERIZED Performed at [facility], [address], [place], [place] [address]    Special Requests   Final    NONE Performed at [facility], [name], [place], [place] [address]    Culture (A)  Final    50,000 COLONIES/mL ENTEROBACTER CLOACAE 70,000 COLONIES/mL ENTEROCOCCUS FAECALIS    Report Status 01/16/2021 FINAL  Final   Organism ID, Bacteria ENTEROBACTER CLOACAE (A)  Final   Organism  ID, Bacteria ENTEROCOCCUS FAECALIS (A)  Final      Susceptibility   Enterobacter cloacae - MIC*    CEFAZOLIN RESISTANT Resistant     CEFEPIME <=0.12 SENSITIVE Sensitive     CIPROFLOXACIN 1 RESISTANT Resistant     GENTAMICIN <=1 SENSITIVE Sensitive     IMIPENEM <=0.25 SENSITIVE Sensitive     NITROFURANTOIN 64 INTERMEDIATE Intermediate     TRIMETH/SULFA >=320 RESISTANT Resistant     PIP/TAZO <=4 SENSITIVE Sensitive     * 50,000 COLONIES/mL ENTEROBACTER CLOACAE   Enterococcus faecalis - MIC*    AMPICILLIN <=2 SENSITIVE Sensitive     NITROFURANTOIN <=16 SENSITIVE Sensitive     VANCOMYCIN 1 SENSITIVE Sensitive     * 70,000 COLONIES/mL ENTEROCOCCUS FAECALIS  MRSA Next Gen by PCR, Nasal     Status: None   Collection Time: 01/14/21  2:24 PM   Specimen: Nasal Mucosa; Nasal Swab  Result Value Ref Range Status   MRSA by PCR Next Gen NOT DETECTED NOT DETECTED Final    Comment: (NOTE) The GeneXpert MRSA Assay (FDA approved for NASAL specimens only), is one component of a comprehensive MRSA colonization surveillance program. It is not intended to diagnose MRSA infection nor to guide or monitor treatment for MRSA infections. Test performance is not FDA approved in patients less than 9 years old. Performed at [facility], [address] [place], [place] [address]          Radiology Studies: No results found.      Scheduled Meds:  aspirin EC  81 mg Oral Daily   atorvastatin  40 mg Oral QHS   Chlorhexidine Gluconate Cloth  6 each Topical Daily   clopidogrel  75 mg Oral Daily   donepezil  10 mg Oral QHS   finasteride  5 mg Oral Daily   furosemide  80 mg Intravenous BID   heparin  5,000 Units Subcutaneous Q8H   insulin aspart  0-5 Units Subcutaneous QHS   insulin aspart  0-9 Units Subcutaneous TID WC   metoprolol succinate  50 mg Oral Daily   mirtazapine  15 mg Oral QHS   nicotine  21 mg Transdermal Daily   risperiDONE  1 mg Oral BID   tamsulosin  0.4 mg Oral Daily    Continuous Infusions:     LOS: 4 days    Time spent: 16mins  Kathie Dike, MD [facility]   If 7PM-7AM, please contact night-coverage [email or web address]  01/17/2021, 7:33 PM

## 2021-01-17 NOTE — Clinical Social Work Note (Signed)
Message left for spouse requesting return contact to discuss discharge plan.   Dalicia Kisner, Clydene Pugh, LCSW

## 2021-01-18 ENCOUNTER — Inpatient Hospital Stay (HOSPITAL_COMMUNITY): Payer: Medicare Other

## 2021-01-18 DIAGNOSIS — J9601 Acute respiratory failure with hypoxia: Secondary | ICD-10-CM | POA: Diagnosis not present

## 2021-01-18 DIAGNOSIS — I480 Paroxysmal atrial fibrillation: Secondary | ICD-10-CM

## 2021-01-18 LAB — CULTURE, BLOOD (ROUTINE X 2)
Culture: NO GROWTH
Culture: NO GROWTH
Special Requests: ADEQUATE

## 2021-01-18 LAB — RENAL FUNCTION PANEL
Albumin: 2.8 g/dL — ABNORMAL LOW (ref 3.5–5.0)
Anion gap: 7 (ref 5–15)
BUN: 43 mg/dL — ABNORMAL HIGH (ref 8–23)
CO2: 30 mmol/L (ref 22–32)
Calcium: 8.2 mg/dL — ABNORMAL LOW (ref 8.9–10.3)
Chloride: 102 mmol/L (ref 98–111)
Creatinine, Ser: 1.79 mg/dL — ABNORMAL HIGH (ref 0.61–1.24)
GFR, Estimated: 39 mL/min — ABNORMAL LOW (ref >60)
Glucose, Bld: 222 mg/dL — ABNORMAL HIGH (ref 70–99)
Phosphorus: 3.3 mg/dL (ref 2.5–4.6)
Potassium: 3.7 mmol/L (ref 3.5–5.1)
Sodium: 139 mmol/L (ref 135–145)

## 2021-01-18 LAB — GLUCOSE, CAPILLARY
Glucose-Capillary: 206 mg/dL — ABNORMAL HIGH (ref 70–99)
Glucose-Capillary: 191 mg/dL — ABNORMAL HIGH (ref 70–99)
Glucose-Capillary: 323 mg/dL — ABNORMAL HIGH (ref 70–99)
Glucose-Capillary: 403 mg/dL — ABNORMAL HIGH (ref 70–99)

## 2021-01-18 LAB — GLUCOSE, RANDOM: Glucose, Bld: 396 mg/dL — ABNORMAL HIGH (ref 70–99)

## 2021-01-18 NOTE — Progress Notes (Signed)
Inpatient Diabetes Program Recommendations  AACE/ADA: New Consensus Statement on Inpatient Glycemic Control (2015)  Target Ranges:  Prepandial:   less than 140 mg/dL      Peak postprandial:   less than 180 mg/dL (1-2 hours)      Critically ill patients:  140 - 180 mg/dL   Lab Results  Component Value Date   GLUCAP 191 (H) 01/18/2021   HGBA1C 8.8 (H) 01/14/2021    Review of Glycemic Control  Latest Reference Range & Units 01/17/21 07:32 01/17/21 11:00 01/17/21 16:12 01/17/21 21:52 01/18/21 07:47 01/18/21 12:00  Glucose-Capillary 70 - 99 mg/dL 216 (H) 134 (H) 224 (H) 284 (H) 206 (H) 191 (H)  (H): Data is abnormally high History: DM, CKD, Schizophrenia, Dementia   Home DM Meds: Lantus 30 units QHS                             Metformin 500 mg BID   Current Orders: Novolog Sensitive Correction Scale/ SSI (0-9 units) TID AC + HS  Inpatient Diabetes Program Recommendations:   Please consider adding back smaller portion of pt's home dose basal insulin:   Semglee 10 units Daily (1/3 total home dose) Secure chat sent to Dr. Denton Brick.  Thank you, Nani Gasser. Dorri Ozturk, RN, MSN, CDE  Diabetes Coordinator Inpatient Glycemic Control Team Team Pager [phone] (8am-5pm) 01/18/2021 1:34 PM

## 2021-01-18 NOTE — TOC Initial Note (Signed)
Transition of Care [facility]) - Initial/Assessment Note    Patient Details  Name: Allen Yu MRN: [id number] Date of Birth: 1945-04-23  Transition of [facility]) CM/SW Contact:    Ihor Gully, LCSW Phone Number: 01/18/2021, 1:14 PM  Clinical Narrative:                 Patient from home with spouse. Admitted for acute respiratory failure with hypoxia. Discussed PT recommendation for SNF. Mrs. Petrosino plans to take patient home. He has 36 hours per week of care giving through the [place]. He has a wc, lift, chair, lift platform, and roll in shower ([facility] just [facility]). Patient also has a homebound team from the [place] with a NP, dietician, etc., coming to see him. He has been approved for OPPT through the [place] and will have 15 sessions beginning on 01/26/21.    Expected Discharge Plan: [place] Barriers to Discharge: Continued Medical Work up   Patient Goals and CMS Choice Patient states their goals for this hospitalization and ongoing recovery are:: home with already existing services.      Expected Discharge Plan and Services Expected Discharge Plan: [place] Choice: [place] arrangements for the past 2 months: Single Family Home                                      Prior Living Arrangements/Services Living arrangements for the past 2 months: Single Family Home Lives with:: Spouse Patient language and need for interpreter reviewed:: Yes        Need for Family Participation in Patient Care: Yes (Comment) Care giver support system in place?: Yes (comment) Current home services: DME, Other (comment) (w/c, lift chair, lift platform, roll in shower, Services through [place]) Criminal Activity/Legal Involvement Pertinent to Current Situation/Hospitalization: No - Comment as needed  Activities of Daily Living Home Assistive Devices/Equipment: Cane (specify quad or straight), Walker (specify type), Wheelchair,  Bedside commode/3-in-1, CBG Meter, Hearing aid ADL Screening (condition at time of admission) Patient's cognitive ability adequate to safely complete daily activities?: No Is the patient deaf or have difficulty hearing?: Yes Does the patient have difficulty seeing, even when wearing glasses/contacts?: No Does the patient have difficulty concentrating, remembering, or making decisions?: Yes Patient able to express need for assistance with ADLs?: Yes Does the patient have difficulty dressing or bathing?: Yes Independently performs ADLs?: No Communication: Independent Dressing (OT): Needs assistance Is this a change from baseline?: Pre-admission baseline Grooming: Needs assistance Is this a change from baseline?: Pre-admission baseline Feeding: Needs assistance Is this a change from baseline?: Pre-admission baseline Bathing: Needs assistance Is this a change from baseline?: Pre-admission baseline Toileting: Needs assistance Is this a change from baseline?: Pre-admission baseline In/Out Bed: Needs assistance Is this a change from baseline?: Pre-admission baseline Walks in Home: Dependent Is this a change from baseline?: Pre-admission baseline Does the patient have difficulty walking or climbing stairs?: Yes Weakness of Legs: Right Weakness of Arms/Hands: Both  Permission Sought/Granted Permission sought to share information with : Family Supports    Share Information with NAME: Mrs. Mee Hives, spouse           Emotional Assessment     Affect (typically observed): Appropriate Orientation: : Oriented to Self Alcohol / Substance Use: Not Applicable Psych Involvement: No (comment)  Admission diagnosis:  Lactic acidosis [  E87.20] Acute respiratory failure with hypoxia ([place]) [J96.01] Diabetic ketoacidosis without coma associated with other specified diabetes mellitus ([place]) [E13.10] Patient Active Problem List   Diagnosis Date Noted   Acute respiratory failure with hypoxia ([place])  01/13/2021   Encounter for fitting and adjustment of hearing aid 12/21/2020   Other specified problems related to psychosocial circumstances 11/21/2020   Sensorineural hearing loss, bilateral 11/21/2020   Tobacco dependence 10/06/2020   Benign prostatic hyperplasia with urinary obstruction 10/06/2020   Above knee amputation of left lower extremity ([place]) 10/06/2020   Other specified counseling 10/06/2020   Severe sepsis ([place]) 09/24/2020   Schizophrenia, unspecified ([place]) 09/24/2020   Unspecified dementia, unspecified severity, without behavioral disturbance, psychotic disturbance, mood disturbance, and anxiety ([place]) 09/24/2020   Lung nodule 09/24/2020   Indwelling Foley catheter present 09/24/2020   Peripheral vascular disease ([place]) 09/24/2020   Mass of testicle 09/24/2020   Problem related to unspecified psychosocial circumstances 08/01/2020   Hypermetropia 07/22/2020   Unspecified atrial fibrillation ([place]) 07/22/2020   Klebsiella sepsis ([place]) 07/22/2020   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   SIRS (systemic inflammatory response syndrome) ([place]) 44/03/4740   Acute metabolic encephalopathy 59/56/3875   Hyponatremia    S/P AKA (above knee amputation) unilateral, left ([place]) 06/28/2020   Gangrene of left foot ([place]) 06/28/2020   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 ([place]) 05/27/2020   Critical lower limb ischemia ([place]) 05/21/2020   Malnutrition ([place]) 05/19/2020   Plantar callus 12/24/2019   Cavitary pneumonia 09/14/2019   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   Unintentional weight loss 09/14/2019   Coronary artery calcification seen on CT scan 10/28/2018   Hilar adenopathy 04/10/2016   Lung nodule seen on imaging study 07/09/2015   Acute encephalopathy    Hypoglycemia due to insulin    Type 2 diabetes mellitus with other circulatory complications ([place]) 64/33/2951   Acute kidney injury superimposed on chronic kidney disease ([place]) 07/08/2015   Tobacco use  disorder 07/08/2015   Diverticulosis of colon without hemorrhage    Left inguinal hernia 09/16/2013   Altered mental status 11/17/2012   Hypothermia 11/17/2012   Leukocytosis 11/17/2012   Chronic kidney disease, stage 3 unspecified ([place])    Post-traumatic stress disorder, chronic    GERD (gastroesophageal reflux disease)    Essential (primary) hypertension 09/10/2011   Anemia 09/10/2011   Diabetic neuropathy ([place]) 09/10/2011   DDD (degenerative disc disease), lumbar 09/10/2011   Psychosis ([place]) 09/10/2011   Diabetes mellitus type 2, uncontrolled 09/10/2011   Hyperlipemia 09/10/2011   Microalbuminuria 09/10/2011   PCP:  [name], MD Pharmacy:   [facility] - [address], [place] [address] Phone: [phone] Fax: [place], [place]. [place] [address] Phone: [phone] Fax: [phone]  [place], [place]. [place] [address] Phone: [phone] Fax: [place], [place] - [address] [place] [address] Phone: [phone] Fax: [phone]     Social Determinants of Health (SDOH) Interventions    Readmission Risk Interventions Readmission Risk Prevention Plan 09/26/2020 07/26/2020 06/30/2020  Post Dischage Appt - - Complete  Medication Screening - - Complete  Transportation Screening Complete Complete Complete  PCP or Specialist Appt within 3-5 Days -  Complete -  HRI or Home Care Consult - Complete -  Social Work Consult for Recovery Care Planning/Counseling - Complete -  Palliative Care Screening - Complete -  Medication Review Press photographer) Complete Complete -  [place] or Home Care Consult Complete - -  SW Recovery  Care/Counseling Consult Complete - -  Palliative Care Screening Not Complete - -  Comments Referred to [facility] Palliative in July - -  [place] Not Complete - -  SNF Comments Refusing - -  Some recent data might be hidden

## 2021-01-18 NOTE — Progress Notes (Signed)
Progress Note  Patient Name: Allen Yu Date of Encounter: 01/18/2021  Primary Cardiologist: Rozann Lesches, MD  Subjective   More alert this AM - opens eyes to name.  Grunts some, but does not otw follow commands.  Inpatient Medications    Scheduled Meds:  aspirin EC  81 mg Oral Daily   atorvastatin  40 mg Oral QHS   Chlorhexidine Gluconate Cloth  6 each Topical Daily   clopidogrel  75 mg Oral Daily   donepezil  10 mg Oral QHS   finasteride  5 mg Oral Daily   furosemide  80 mg Intravenous BID   heparin  5,000 Units Subcutaneous Q8H   insulin aspart  0-5 Units Subcutaneous QHS   insulin aspart  0-9 Units Subcutaneous TID WC   metoprolol succinate  50 mg Oral Daily   mirtazapine  15 mg Oral QHS   nicotine  21 mg Transdermal Daily   risperiDONE  1 mg Oral BID   tamsulosin  0.4 mg Oral Daily   Continuous Infusions:  PRN Meds: acetaminophen **OR** acetaminophen, dextrose, haloperidol lactate, ondansetron **OR** ondansetron (ZOFRAN) IV, oxyCODONE   Vital Signs    Vitals:   01/18/21 0300 01/18/21 0400 01/18/21 0500 01/18/21 0750  BP: 124/71 (!) 142/92 (!) 138/110   Pulse: (!) 46 (!) 31 75   Resp: 20 15 18    Temp:  98.2 F (36.8 C)  (!) 97.1 F (36.2 C)  TempSrc:  Oral  Axillary  SpO2: 100% 100% 97%   Weight:   51.3 kg   Height:        Intake/Output Summary (Last 24 hours) at 01/18/2021 0924 Last data filed at 01/18/2021 0500 Gross per 24 hour  Intake 370 ml  Output 3700 ml  Net -3330 ml   Filed Weights   01/15/21 0500 01/16/21 0602 01/18/21 0500  Weight: 53.7 kg 55.5 kg 51.3 kg    Physical Exam   GEN: Thin, frail, in no acute distress. Minimally responsive. HEENT: Grossly normal.  Neck: Supple, no JVD, carotid bruits, or masses. Cardiac: RRR, distant, no murmurs, rubs, or gallops. No clubbing, cyanosis, edema.  Radials 2+. L AKA. Respiratory:  Respirations regular and unlabored, diminished breath GI: Soft, nontender, nondistended, BS + x  4. MS: no deformity or atrophy. Skin: warm and dry, no rash. Neuro:  Does not follow commands. Psych: Minimally responsive - opens eyes, grunts.  Labs    Chemistry Recent Labs  Lab 01/14/21 0447 01/14/21 0909 01/15/21 0519 01/15/21 1311 01/16/21 0423 01/17/21 0409 01/18/21 0432  NA 140   < > 139   < > 132* 136 139  K 3.7   < > 2.9*   < > 3.6 3.6 3.7  CL 104   < > 100   < > 95* 99 102  CO2 23   < > 23   < > 23 27 30   GLUCOSE 124*   < > 417*   < > 254* 229* 222*  BUN 50*   < > 50*   < > 46* 44* 43*  CREATININE 2.18*   < > 2.11*   < > 2.00* 1.82* 1.79*  CALCIUM 8.5*   < > 8.7*   < > 7.8* 8.3* 8.2*  PROT 6.4*  --  6.9  --   --  6.0*  --   ALBUMIN 3.1*  --  3.4*  --   --  3.0* 2.8*  AST 86*  --  111*  --   --  60*  --   ALT 93*  --  121*  --   --  79*  --   ALKPHOS 193*  --  198*  --   --  174*  --   BILITOT 0.8  --  1.1  --   --  1.2  --   GFRNONAA 31*   < > 32*   < > 34* 38* 39*  ANIONGAP 13   < > 16*   < > 14 10 7    < > = values in this interval not displayed.     Hematology Recent Labs  Lab 01/14/21 0447 01/15/21 0519 01/16/21 0423  WBC 6.7 7.2 8.2  RBC 2.62* 2.86* 2.39*  HGB 7.8* 8.9* 7.5*  HCT 24.7* 26.7* 22.8*  MCV 94.3 93.4 95.4  MCH 29.8 31.1 31.4  MCHC 31.6 33.3 32.9  RDW 15.9* 15.9* 16.2*  PLT 297 328 265    Cardiac Enzymes  Recent Labs  Lab 01/13/21 1905 01/13/21 2034  TROPONINIHS 63* 56*      BNP Recent Labs  Lab 01/13/21 1905 01/14/21 0447  BNP 2,873.0* 2,752.0*    Lipids  Lab Results  Component Value Date   CHOL 114 05/28/2020   HDL 41 05/28/2020   LDLCALC 63 05/28/2020   TRIG 49 05/28/2020   CHOLHDL 2.8 05/28/2020    HbA1c  Lab Results  Component Value Date   HGBA1C 8.8 (H) 01/14/2021    Radiology    -------------------  Telemetry    RSR, freq PACs/PVCs, 9 beats NSVT - Personally Reviewed  Cardiac Studies   09/2020 Echo: EF 45%, mild LVH  12/2020 Echo: EF 25-30%, glob HK, mod LVH, mildly reduced RV fxn, RVSP  65.71mmHg, mod BAE. Triv effusion. Mod MR/TR.  Patient Profile     Allen Yu is a 75 y.o. male with a history of PAD s/p L AKA 06/2020, paraxosymal Afib (dx 07/2020), HFmrEF, HTN, DMII, PTSD, schizophrenia, dementia, PTSD, and CKD III, who was admitted 12/23 w/ resp failure and CHF, and found to have worsening LV dysfxn.  Assessment & Plan    1.  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 fib.  He has been medically managed as an outpatient on beta-blocker therapy with future plans to add ACE inhibitor (intolerance to ARB's-delirium).  Unfortunately, he was readmitted December 24 with a 24-hour history of dyspnea.  In the ED, his BNP was elevated at 2873.  Troponins were mildly elevated at 63 and 56.  Creatinine was elevated above baseline at 2.21.  Chest x-ray showed mild bilateral lower lobe infiltrates-left greater than right, and small left effusion.  Repeat echocardiogram on December 24 showing worsening of LV function-now measured at 25 to 30% with global hypokinesis, severely elevated RVSP of 65.7 mmHg, and moderate MR/TR.  Discussed in detail w/ pts wife 12/27.  Plan conservative mgmt.  He appears euvolemic on exam and wt down below prior recent baseline.  Minus 5.9L since admission.  Renal function cont to improve w/ diuresis.  Rec ongoing IV diuresis today.  Perhaps can change to oral lasix tomorrow.  Cont ? blocker.  Hold off on acei (intolerant to arb) given that creat remains above baseline.  Poor SGLT2i candidate.  2.  PAF:  Dx 07/2020.  Maintaining sinus on ? blocker.  No OAC per wife's wishes.  3.  Essential HTN:  Variable but relatively stable on ? blocker and w/ diuresis.  4.  DMII:  noncompliant w/ insulin @ home.  Per primary team.  5.  CKD III:  creat improving.  Was nl in October.  Follow w/ diuresis.  Signed, Murray Hodgkins, NP  01/18/2021, 9:24  AM    For questions or updates, please contact   Please consult [email or web address] for contact info under Cardiology/STEMI.

## 2021-01-18 NOTE — Progress Notes (Addendum)
PROGRESS NOTE    Allen Yu  NWG:[id number] DOB: 09-20-1945 DOA: 01/13/2021 PCP: [name], MD    Brief Narrative:  75 year old male with a history of dementia, PTSD, paroxysmal atrial fibrillation, diabetes, hypertension, systolic dysfunction who is not on chronic diuretics.  He was brought to the hospital with progressive shortness of breath and acute respiratory failure.  Noted to be in decompensated CHF.  Repeat echocardiogram shows drop in ejection fraction of 25 to 30% with global hypokinesis.  He is noted to have elevated LFTs and acute kidney injury, likely related to cardiorenal/low output.  He has been started on intravenous diuretics.   Assessment & Plan:   Principal Problem:   Acute respiratory failure with hypoxia (HCC) Active Problems:   Type 2 diabetes mellitus with other circulatory complications (HCC)   Acute kidney injury superimposed on chronic kidney disease (HCC)   S/P AKA (above knee amputation) unilateral, left (HCC)   Unspecified atrial fibrillation (HCC)   Indwelling Foley catheter present   Peripheral vascular disease ([place])   Benign prostatic hyperplasia with urinary obstruction   Acute respiratory failure with hypoxia -Related decompensated CHF -Initially requiring 3 to 4 L of oxygen -Currently on 2 L of oxygen  Acute on chronic systolic CHF -Patient presented with volume overload -Does not appear to be on chronic diuretics -EF has further declined when compared to echocardiogram from 09/2020 -Current EF 25 to 30% -Cardiology service recommends additional IV diuresis over the next couple of days prior to transitioning to oral Lasix  Acute kidney injury -Related to cardiorenal syndrome and low output -Creatinine 2.2 on admission -Baseline creatinine of 1.1 -Current creatinine 1.82 -Continue to monitor with diuresis  Elevated liver enzymes -Likely related to hepatic congestion from decreased cardiac output -Appear to be improving with  diuresis  Type 2 diabetes -Initially presented with hyperglycemia and was started on insulin infusion -Subsequently transition to subcutaneous insulin -He was transitioned to 30 units of basal insulin, although I suspect this may be more than what he takes at home -He was subsequently hypoglycemic on 12/25 and required dextrose infusion -Dextrose infusion discontinued on 12/26 and he has not had any further hypoglycemia -A1c 8.8 -Continue to monitor blood sugars and if he sustains elevated blood sugars, can consider resuming basal insulin.  Paroxysmal atrial fibrillation -Currently on metoprolol, heart rate stable -He is not on chronic anticoagulation -Records indicate that risks of anticoagulation were discussed with patient's family in the past and they elected not to pursue anticoagulation  Anemia, likely related to chronic kidney disease -Baseline hemoglobin appears to run between 7-8 -Continue to follow hemoglobin, no signs of bleeding  BPH with chronic indwelling Foley catheter  PTSD/dementia -Continue on Risperdal -Haldol as needed since he has become agitated in the hospital in the past  Generalized weakness -Seen by PT with recommendations for skilled nursing facility -Discussed with his wife, she wishes to take him home with home health services.  Left upper extremity swelling -Check venous Dopplers  Social/Ethics--would benefit from home/outpatient palliative and hospice services, patient is a [place] patient   DVT prophylaxis: heparin injection 5,000 Units Start: 01/13/21 2230 SCDs Start: 01/13/21 2219  Code Status: DNR Family Communication: Updated patient's wife 12/27 Disposition Plan: Status is: Inpatient  Remains inpatient appropriate because: Needs continued IV diuresis       Consultants:  Cardiology Palliative care  Procedures:  Echocardiogram: EF 25 to 30% with global hypokinesis.  EF is lower as compared to echo from 09/2020  Antimicrobials:  Subjective: He remains confused.  Staff reports that he has better p.o. intake today.  Objective: Vitals:   01/18/21 1300 01/18/21 1319 01/18/21 1500 01/18/21 1626  BP: (!) 86/47 105/62 111/86   Pulse: 69  74   Resp: 17 17 17    Temp:    (!) 97.1 F (36.2 C)  TempSrc:    Axillary  SpO2: 100% 100% 99%   Weight:      Height:        Intake/Output Summary (Last 24 hours) at 01/18/2021 1901 Last data filed at 01/18/2021 1749 Gross per 24 hour  Intake 650 ml  Output 4000 ml  Net -3350 ml   Filed Weights   01/15/21 0500 01/16/21 0602 01/18/21 0500  Weight: 53.7 kg 55.5 kg 51.3 kg    Examination:  General exam: Appears calm and comfortable  Respiratory system: Somewhat diminished in bases, faint bibasilar rales Cardiovascular system: S1 & S2 heard, RRR. No murmurs, rubs, gallops or clicks.  Gastrointestinal system: Abdomen is nondistended, soft and nontender. No Normal bowel sounds heard. Central nervous system: Alert and oriented.  Generalized weakness, no focal neurological deficits. Extremities: No pedal edema bilaterally, he does have some edema in the left upper extremity -Lower extremity with muscle wasting/atrophy Skin: No rashes, lesions or ulcers Psychiatry: Pleasantly confused,   Data Reviewed: I have personally reviewed following labs and imaging studies  CBC: Recent Labs  Lab 01/13/21 1905 01/14/21 0447 01/15/21 0519 01/16/21 0423  WBC 7.4 6.7 7.2 8.2  NEUTROABS 6.1 5.2  --   --   HGB 8.8* 7.8* 8.9* 7.5*  HCT 26.6* 24.7* 26.7* 22.8*  MCV 94.3 94.3 93.4 95.4  PLT 353 297 328 962   Basic Metabolic Panel: Recent Labs  Lab 01/14/21 0447 01/14/21 0909 01/15/21 0519 01/15/21 1311 01/16/21 0423 01/17/21 0409 01/18/21 0432  NA 140   < > 139 138 132* 136 139  K 3.7   < > 2.9* 2.8* 3.6 3.6 3.7  CL 104   < > 100 100 95* 99 102  CO2 23   < > 23 23 23 27 30   GLUCOSE 124*   < > 417* 81 254* 229* 222*  BUN 50*   < > 50* 47* 46* 44* 43*  CREATININE  2.18*   < > 2.11* 1.94* 2.00* 1.82* 1.79*  CALCIUM 8.5*   < > 8.7* 8.2* 7.8* 8.3* 8.2*  MG 1.6*  --  1.5*  --   --   --   --   PHOS  --   --  4.4  --   --   --  3.3   < > = values in this interval not displayed.   GFR: Estimated Creatinine Clearance: 25.9 mL/min (A) (by C-G formula based on SCr of 1.79 mg/dL (H)). Liver Function Tests: Recent Labs  Lab 01/13/21 1905 01/14/21 0447 01/15/21 0519 01/17/21 0409 01/18/21 0432  AST 83* 86* 111* 60*  --   ALT 98* 93* 121* 79*  --   ALKPHOS 223* 193* 198* 174*  --   BILITOT 1.1 0.8 1.1 1.2  --   PROT 7.4 6.4* 6.9 6.0*  --   ALBUMIN 3.6 3.1* 3.4* 3.0* 2.8*   No results for input(s): LIPASE, AMYLASE in the last 168 hours. No results for input(s): AMMONIA in the last 168 hours. Coagulation Profile: No results for input(s): INR, PROTIME in the last 168 hours. Cardiac Enzymes: No results for input(s): CKTOTAL, CKMB, CKMBINDEX, TROPONINI in the last 168 hours. BNP (last  3 results) No results for input(s): PROBNP in the last 8760 hours. HbA1C: No results for input(s): HGBA1C in the last 72 hours.  CBG: Recent Labs  Lab 01/17/21 1612 01/17/21 2152 01/18/21 0747 01/18/21 1200 01/18/21 1629  GLUCAP 224* 284* 206* 191* 323*   Lipid Profile: No results for input(s): CHOL, HDL, LDLCALC, TRIG, CHOLHDL, LDLDIRECT in the last 72 hours. Thyroid Function Tests: No results for input(s): TSH, T4TOTAL, FREET4, T3FREE, THYROIDAB in the last 72 hours.  Anemia Panel: No results for input(s): VITAMINB12, FOLATE, FERRITIN, TIBC, IRON, RETICCTPCT in the last 72 hours. Sepsis Labs: Recent Labs  Lab 01/13/21 2231 01/14/21 0107 01/14/21 1527 01/15/21 0519  LATICACIDVEN 5.6* 4.6* 2.1* 1.9    Recent Results (from the past 240 hour(s))  Resp Panel by RT-PCR (Flu A&B, Covid) Nasopharyngeal Swab     Status: None   Collection Time: 01/13/21  7:15 PM   Specimen: Nasopharyngeal Swab; Nasopharyngeal(NP) swabs in vial transport medium  Result Value  Ref Range Status   SARS Coronavirus 2 by RT PCR NEGATIVE NEGATIVE Final    Comment: (NOTE) SARS-CoV-2 target nucleic acids are NOT DETECTED.  The SARS-CoV-2 RNA is generally detectable in upper respiratory specimens during the acute phase of infection. The lowest concentration of SARS-CoV-2 viral copies this assay can detect is 138 copies/mL. A negative result does not preclude SARS-Cov-2 infection and should not be used as the sole basis for treatment or other patient management decisions. A negative result may occur with  improper specimen collection/handling, submission of specimen other than nasopharyngeal swab, presence of viral mutation(s) within the areas targeted by this assay, and inadequate number of viral copies(<138 copies/mL). A negative result must be combined with clinical observations, patient history, and epidemiological information. The expected result is Negative.  Fact Sheet for Patients:  EntrepreneurPulse.com.au  Fact Sheet for Healthcare Providers:  IncredibleEmployment.be  This test is no t yet approved or cleared by the [place] FDA and  has been authorized for detection and/or diagnosis of SARS-CoV-2 by FDA under an Emergency Use Authorization (EUA). This EUA will remain  in effect (meaning this test can be used) for the duration of the COVID-19 declaration under Section 564(b)(1) of the Act, 21 U.S.C.section 360bbb-3(b)(1), unless the authorization is terminated  or revoked sooner.       Influenza A by PCR NEGATIVE NEGATIVE Final   Influenza B by PCR NEGATIVE NEGATIVE Final    Comment: (NOTE) The Xpert Xpress SARS-CoV-2/FLU/RSV plus assay is intended as an aid in the diagnosis of influenza from Nasopharyngeal swab specimens and should not be used as a sole basis for treatment. Nasal washings and aspirates are unacceptable for Xpert Xpress SARS-CoV-2/FLU/RSV testing.  Fact Sheet for  Patients: EntrepreneurPulse.com.au  Fact Sheet for Healthcare Providers: IncredibleEmployment.be  This test is not yet approved or cleared by the [place] FDA and has been authorized for detection and/or diagnosis of SARS-CoV-2 by FDA under an Emergency Use Authorization (EUA). This EUA will remain in effect (meaning this test can be used) for the duration of the COVID-19 declaration under Section 564(b)(1) of the Act, 21 U.S.C. section 360bbb-3(b)(1), unless the authorization is terminated or revoked.  Performed at [facility], [address], [place], [place] [address]   Culture, blood (routine x 2)     Status: None   Collection Time: 01/13/21  8:25 PM   Specimen: Right Antecubital; Blood  Result Value Ref Range Status   Specimen Description   Final    RIGHT ANTECUBITAL BOTTLES  DRAWN AEROBIC AND ANAEROBIC   Special Requests Blood Culture adequate volume  Final   Culture   Final    NO GROWTH 5 DAYS Performed at [facility], [address] [place], [place] [address]    Report Status 01/18/2021 FINAL  Final  Culture, blood (routine x 2)     Status: None   Collection Time: 01/13/21  8:33 PM   Specimen: BLOOD RIGHT ARM  Result Value Ref Range Status   Specimen Description BLOOD RIGHT ARM  Final   Special Requests   Final    BOTTLES DRAWN AEROBIC ONLY Blood Culture results may not be optimal due to an inadequate volume of blood received in culture bottles   Culture   Final    NO GROWTH 5 DAYS Performed at [facility], [address] [place], [place] [address]    Report Status 01/18/2021 FINAL  Final  Urine Culture     Status: Abnormal   Collection Time: 01/13/21 10:04 PM   Specimen: Urine, Catheterized  Result Value Ref Range Status   Specimen Description   Final    URINE, CATHETERIZED Performed at [facility], [address], [place], [place] [address]    Special Requests   Final    NONE Performed at [facility], [address] [place], [place] [address]    Culture (A)  Final    50,000 COLONIES/mL ENTEROBACTER CLOACAE 70,000 COLONIES/mL ENTEROCOCCUS FAECALIS    Report Status 01/16/2021 FINAL  Final   Organism ID, Bacteria ENTEROBACTER CLOACAE (A)  Final   Organism ID, Bacteria ENTEROCOCCUS FAECALIS (A)  Final      Susceptibility   Enterobacter cloacae - MIC*    CEFAZOLIN RESISTANT Resistant     CEFEPIME <=0.12 SENSITIVE Sensitive     CIPROFLOXACIN 1 RESISTANT Resistant     GENTAMICIN <=1 SENSITIVE Sensitive     IMIPENEM <=0.25 SENSITIVE Sensitive     NITROFURANTOIN 64 INTERMEDIATE Intermediate     TRIMETH/SULFA >=320 RESISTANT Resistant     PIP/TAZO <=4 SENSITIVE Sensitive     * 50,000 COLONIES/mL ENTEROBACTER CLOACAE   Enterococcus faecalis - MIC*    AMPICILLIN <=2 SENSITIVE Sensitive     NITROFURANTOIN <=16 SENSITIVE Sensitive     VANCOMYCIN 1 SENSITIVE Sensitive     * 70,000 COLONIES/mL ENTEROCOCCUS FAECALIS  MRSA Next Gen by PCR, Nasal     Status: None   Collection Time: 01/14/21  2:24 PM   Specimen: Nasal Mucosa; Nasal Swab  Result Value Ref Range Status   MRSA by PCR Next Gen NOT DETECTED NOT DETECTED Final    Comment: (NOTE) The GeneXpert MRSA Assay (FDA approved for NASAL specimens only), is one component of a comprehensive MRSA colonization surveillance program. It is not intended to diagnose MRSA infection nor to guide or monitor treatment for MRSA infections. Test performance is not FDA approved in patients less than 57 years old. Performed at [facility], [address], [place], [place] [address]      Radiology Studies: US Venous Img Upper Uni Left (DVT)  Result Date: 01/18/2021 CLINICAL DATA:  LEFT upper extremity edema. EXAM: LEFT UPPER EXTREMITY VENOUS DOPPLER ULTRASOUND TECHNIQUE: Gray-scale sonography with graded compression, as well as color Doppler and duplex ultrasound were performed to evaluate the upper extremity deep venous system from the level of the subclavian vein  and including the jugular, axillary, basilic, radial, ulnar and upper cephalic vein. Spectral Doppler was utilized to evaluate flow at rest and with distal augmentation maneuvers. COMPARISON:  Chest  XR, 01/13/2021.  CTA chest, 06/29/2020. FINDINGS: VENOUS Normal compressibility of the LEFT internal jugular, subclavian, axillary, cephalic, basilic, brachial, radial and ulnar veins. No filling defects to suggest DVT on grayscale or color Doppler imaging. Doppler waveforms show normal direction of venous flow, normal respiratory plasticity and response to augmentation. Limited views of the contralateral subclavian vein are unremarkable. OTHER Incidental, LEFT carotid atherosclerosis.  Incompletely assessed. Subcutaneous edema, within the imaged LEFT upper extremity. Incompletely imaged. Limitations: none IMPRESSION: No evidence of DVT within the LEFT upper extremity. Michaelle Birks, MD [facility] Radiology Electronically Signed   By: Michaelle Birks M.D.   On: 01/18/2021 12:04     Scheduled Meds:  aspirin EC  81 mg Oral Daily   atorvastatin  40 mg Oral QHS   Chlorhexidine Gluconate Cloth  6 each Topical Daily   clopidogrel  75 mg Oral Daily   donepezil  10 mg Oral QHS   finasteride  5 mg Oral Daily   furosemide  80 mg Intravenous BID   heparin  5,000 Units Subcutaneous Q8H   insulin aspart  0-5 Units Subcutaneous QHS   insulin aspart  0-9 Units Subcutaneous TID WC   metoprolol succinate  50 mg Oral Daily   mirtazapine  15 mg Oral QHS   nicotine  21 mg Transdermal Daily   risperiDONE  1 mg Oral BID   tamsulosin  0.4 mg Oral Daily   Continuous Infusions:   LOS: 5 days    Roxan Hockey, MD [facility]   If 7PM-7AM, please contact night-coverage [email or web address]  01/18/2021, 7:01 PM

## 2021-01-19 DIAGNOSIS — J9601 Acute respiratory failure with hypoxia: Secondary | ICD-10-CM | POA: Diagnosis not present

## 2021-01-19 DIAGNOSIS — Z515 Encounter for palliative care: Secondary | ICD-10-CM | POA: Diagnosis not present

## 2021-01-19 DIAGNOSIS — Z7189 Other specified counseling: Secondary | ICD-10-CM | POA: Diagnosis not present

## 2021-01-19 LAB — CBC
HCT: 27.1 % — ABNORMAL LOW (ref 39.0–52.0)
Hemoglobin: 8.8 g/dL — ABNORMAL LOW (ref 13.0–17.0)
MCH: 31.5 pg (ref 26.0–34.0)
MCHC: 32.5 g/dL (ref 30.0–36.0)
MCV: 97.1 fL (ref 80.0–100.0)
Platelets: 336 10*3/uL (ref 150–400)
RBC: 2.79 MIL/uL — ABNORMAL LOW (ref 4.22–5.81)
RDW: 16.6 % — ABNORMAL HIGH (ref 11.5–15.5)
WBC: 7.9 10*3/uL (ref 4.0–10.5)
nRBC: 0 % (ref 0.0–0.2)

## 2021-01-19 LAB — GLUCOSE, CAPILLARY
Glucose-Capillary: 266 mg/dL — ABNORMAL HIGH (ref 70–99)
Glucose-Capillary: 259 mg/dL — ABNORMAL HIGH (ref 70–99)
Glucose-Capillary: 149 mg/dL — ABNORMAL HIGH (ref 70–99)

## 2021-01-19 LAB — BASIC METABOLIC PANEL
Anion gap: 11 (ref 5–15)
BUN: 46 mg/dL — ABNORMAL HIGH (ref 8–23)
CO2: 27 mmol/L (ref 22–32)
Calcium: 8.4 mg/dL — ABNORMAL LOW (ref 8.9–10.3)
Chloride: 101 mmol/L (ref 98–111)
Creatinine, Ser: 1.96 mg/dL — ABNORMAL HIGH (ref 0.61–1.24)
GFR, Estimated: 35 mL/min — ABNORMAL LOW (ref >60)
Glucose, Bld: 266 mg/dL — ABNORMAL HIGH (ref 70–99)
Potassium: 3.9 mmol/L (ref 3.5–5.1)
Sodium: 139 mmol/L (ref 135–145)

## 2021-01-19 MED ORDER — CLOPIDOGREL BISULFATE 75 MG PO TABS: 75.0000 mg | ORAL_TABLET | Freq: Every day | ORAL | 11 refills | Status: DC

## 2021-01-19 MED ORDER — ONDANSETRON HCL 4 MG PO TABS
4.0000 mg | ORAL_TABLET | Freq: Four times a day (QID) | ORAL | 0 refills | Status: DC | PRN
Start: 2021-01-19 — End: 2021-10-17

## 2021-01-19 MED ORDER — LANTUS 100 UNIT/ML ~~LOC~~ SOLN: 6.0000 [IU] | Freq: Every evening | SUBCUTANEOUS | 11 refills | Status: DC | PRN

## 2021-01-19 MED ORDER — MULTI-VITAMIN/MINERALS PO TABS: 1.0000 | ORAL_TABLET | Freq: Every day | ORAL | 2 refills | Status: DC

## 2021-01-19 MED ORDER — ASPIRIN EC 81 MG PO TBEC: 81.0000 mg | DELAYED_RELEASE_TABLET | Freq: Every day | ORAL | 2 refills | Status: DC

## 2021-01-19 MED ORDER — TORSEMIDE 20 MG PO TABS: 20.0000 mg | ORAL_TABLET | Freq: Every day | ORAL | 1 refills | Status: DC

## 2021-01-19 MED ORDER — MIRTAZAPINE 15 MG PO TABS: 7.5000 mg | ORAL_TABLET | Freq: Every day | ORAL | 2 refills | Status: DC

## 2021-01-19 MED ORDER — RISPERIDONE 0.5 MG PO TABS: 0.5000 mg | ORAL_TABLET | Freq: Two times a day (BID) | ORAL | 1 refills | Status: DC | PRN

## 2021-01-19 MED ORDER — ENSURE COMPLETE PO LIQD: 237.0000 mL | Freq: Two times a day (BID) | ORAL | 1 refills | Status: DC

## 2021-01-19 NOTE — Progress Notes (Signed)
Inpatient Diabetes Program Recommendations  AACE/ADA: New Consensus Statement on Inpatient Glycemic Control (2015)  Target Ranges:  Prepandial:   less than 140 mg/dL      Peak postprandial:   less than 180 mg/dL (1-2 hours)      Critically ill patients:  140 - 180 mg/dL   Lab Results  Component Value Date   GLUCAP 266 (H) 01/19/2021   HGBA1C 8.8 (H) 01/14/2021    Review of Glycemic Control  Latest Reference Range & Units 01/18/21 07:47 01/18/21 12:00 01/18/21 16:29 01/18/21 22:33 01/19/21 07:32  Glucose-Capillary 70 - 99 mg/dL 206 (H) 191 (H) 323 (H) 403 (H) 266 (H)  (H): Data is abnormally high  History: DM, CKD, Schizophrenia, Dementia   Home DM Meds: Lantus 30 units QHS                             Metformin 500 mg BID   Current Orders: Novolog Sensitive Correction Scale/ SSI (0-9 units) TID AC + HS  Inpatient Diabetes Program Recommendations:    CBG's consistently elevated.  Please add:  Semglee 10 units QD (1/3 of home dose)  Will continue to follow while inpatient.  Thank you, Reche Dixon, RN, BSN Diabetes Coordinator Inpatient Diabetes Program [phone] (team pager from 8a-5p)

## 2021-01-19 NOTE — TOC Transition Note (Signed)
Transition of Care [facility]) - CM/SW Discharge Note   Patient Details  Name: Allen Yu MRN: [id number] Date of Birth: 1945-05-30  Transition of Care [facility] CM/SW Contact:  Shade Flood, LCSW Phone Number: 01/19/2021, 5:00 PM   Clinical Narrative:     Pt stable for dc today per MD. Plan remains for return home with resumption of caregivers from the [place] system. No further TOC needs identified.  Final next level of care: [place] Barriers to Discharge: Barriers Resolved   Patient Goals and CMS Choice Patient states their goals for this hospitalization and ongoing recovery are:: home with already existing services.      Discharge Placement                       Discharge Plan and Services     Post Acute Care Choice: Home Health                               Social Determinants of Health (SDOH) Interventions     Readmission Risk Interventions Readmission Risk Prevention Plan 01/18/2021 09/26/2020 07/26/2020  Post Dischage Appt - - -  Medication Screening - - -  Transportation Screening Complete Complete Complete  PCP or Specialist Appt within 3-5 Days - - Complete  [place] or [place] - - Complete  Social Work Consult for [place] Planning/Counseling - - Complete  Palliative Care Screening - - Complete  Medication Review Press photographer) Complete Complete Complete  PCP or Specialist appointment within 3-5 days of discharge Complete - -  [place] or Home Care Consult Complete Complete -  SW Recovery Care/Counseling Consult - Complete -  Palliative Care Screening - Not Complete -  Comments - Referred to [facility] Palliative in July -  Skilled Nursing Facility Patient Refused Not Complete -  SNF Comments - Refusing -  Some recent data might be hidden

## 2021-01-19 NOTE — Care Management Important Message (Signed)
Important Message  Patient Details  Name: MUJTABA BOLLIG MRN: [id number] Date of Birth: 03-May-1945   Medicare Important Message Given:  Yes  Reviewed Medicare IM with Miki Kins, wife, at [phone].  Aware of appeal right.  Copy of Medicare IM sent securely to wife's attention at: [email or web address].    Dannette Barbara 01/19/2021, 10:29 AM

## 2021-01-19 NOTE — Progress Notes (Signed)
Pt discharged to home. DC instructions given with wife at bedside. No concerns voiced. Pt left unit in wheelchair pushed by this Probation officer. Transferred to white SUV by this Probation officer with assistance of wife. Tolerated transfer with no problems.

## 2021-01-19 NOTE — Progress Notes (Signed)
Palliative: Allen Yu is lying quietly in bed.  He appears acutely/chronically ill and quite frail.  He is very hard of hearing with dementia.  He does not respond in any meaningful way to my questions.  He is difficult to direct at times due to hearing loss.  There is no family at bedside at this time.  Allen Yu is active with the [facility].  His wife is seeking further at home assistance with the [place].  Transition of care team is working with family.  Plan: Continue to treat the treatable but no CPR or intubation.  Home with home health/[facility] services.  15 minutes Allen Axe, NP Palliative medicine team Team phone [phone] Greater than 50% of this time was spent counseling and coordinating care related to the above assessment and plan.

## 2021-01-19 NOTE — Discharge Summary (Signed)
Allen Yu, is a 75 y.o. male  DOB 10-22-1945  MRN [id number].  Admission date:  01/13/2021  Admitting Physician  Rolla Plate, DO  Discharge Date:  01/19/2021   Primary MD  [name], MD  Recommendations for primary care physician for things to follow:   1)Avoid ibuprofen/Advil/Aleve/Motrin/Goody Powders/Naproxen/BC powders/Meloxicam/Diclofenac/Indomethacin and other Nonsteroidal anti-inflammatory medications as these will make you more likely to bleed and can cause stomach ulcers, can also cause Kidney problems.   2)Repeat CBC and BMP blood test within the week with primary care provider  3) outpatient palliative care follow-up advised through the [place] services  4)Nutritional supplements strongly recommended   Admission Diagnosis  Lactic acidosis [E87.20] Acute respiratory failure with hypoxia ([place]) [J96.01] Diabetic ketoacidosis without coma associated with other specified diabetes mellitus ([place]) [E13.10]   Discharge Diagnosis  Lactic acidosis [E87.20] Acute respiratory failure with hypoxia ([place]) [J96.01] Diabetic ketoacidosis without coma associated with other specified diabetes mellitus ([place]) [E13.10]    Principal Problem:   Acute respiratory failure with hypoxia ([place]) Active Problems:   Type 2 diabetes mellitus with other circulatory complications ([place])   Acute kidney injury superimposed on chronic kidney disease (HCC)   S/P AKA (above knee amputation) unilateral, left (HCC)   Unspecified atrial fibrillation (HCC)   Indwelling Foley catheter present   Peripheral vascular disease ([place])   Benign prostatic hyperplasia with urinary obstruction      Past Medical History:  Diagnosis Date   Anemia    Arthritis    Carotid stenosis    Chronic back pain    Chronic HFrEF (heart failure with reduced ejection fraction) ([place])    a. 09/2020 Echo: EF 45%, mild LVH; b. 12/2020 Echo: EF  25-30%, glob HK, mod LVH, mildly reduced RV fxn, RVSP 65.76mmHg, mod BAE. Triv effusion. Mod MR/TR.   CKD (chronic kidney disease), stage III ([place])    Constipation    Dementia ([place])    Diabetes mellitus    Dilated cardiomyopathy ([place])    a. 09/2020 Echo: EF 45%; b. 12/2020 Echo: EF 25-30%.   GERD (gastroesophageal reflux disease)    History of kidney stones    Hypertension    Lung nodule    PAF (paroxysmal atrial fibrillation) (HCC)    Peripheral neuropathy    Peripheral vascular disease ([place])    a. 06/2020 s/p L AKA; b. 10/2020 s/p R SFA and above-knee popliteal PTA.   PTSD (post-traumatic stress disorder)    Schizophrenia ([place])    Tuberculosis    Treated    Past Surgical History:  Procedure Laterality Date   ABDOMINAL AORTOGRAM W/LOWER EXTREMITY N/A 05/20/2020   Procedure: ABDOMINAL AORTOGRAM W/LOWER EXTREMITY;  Surgeon: Elam Dutch, MD;  Location: [facility];  Service: Cardiovascular;  Laterality: N/A;   ABDOMINAL AORTOGRAM W/LOWER EXTREMITY N/A 10/27/2020   Procedure: ABDOMINAL AORTOGRAM W/LOWER EXTREMITY;  Surgeon: Marty Heck, MD;  Location: [facility];  Service: Cardiovascular;  Laterality: N/A;   AMPUTATION Left 06/28/2020   Procedure: AMPUTATION ABOVE KNEE  LEFT;  Surgeon: Rosetta Posner, MD;  Location: [facility] OR;  Service: Vascular;  Laterality: Left;   [place], 2013   x2   BLADDER SURGERY     02   CHOLECYSTECTOMY     COLONOSCOPY     COLONOSCOPY N/A 12/07/2014   Procedure: COLONOSCOPY;  Surgeon: Daneil Dolin, MD;  [facility];  Service: Endoscopy;  Laterality: N/A;  10:30 Am   EYE SURGERY Bilateral    removed metal from eye   FEMORAL-TIBIAL BYPASS GRAFT Left 05/27/2020   Procedure: LEFT FEMORAL TO PERONEAL ARTERY BYPASS;  Surgeon: Rosetta Posner, MD;  Location: [facility];  Service: Vascular;  Laterality: Left;   HERNIA REPAIR Right    INGUINAL HERNIA REPAIR Left 11/03/2013   Procedure: HERNIA REPAIR INGUINAL  ADULT;  Surgeon: Gayland Curry, MD;  Location: [facility];  Service: General;  Laterality: Left;   PERIPHERAL VASCULAR INTERVENTION  10/27/2020   Procedure: PERIPHERAL VASCULAR INTERVENTION;  Surgeon: Marty Heck, MD;  Location: [facility];  Service: Cardiovascular;;   SHOULDER SURGERY     RIGHT SHOULDER    VIDEO BRONCHOSCOPY WITH ENDOBRONCHIAL NAVIGATION N/A 07/10/2019   Procedure: VIDEO BRONCHOSCOPY WITH ENDOBRONCHIAL NAVIGATION;  Surgeon: Melrose Nakayama, MD;  Location: [facility];  Service: Thoracic;  Laterality: N/A;       HPI  from the history and physical done on the day of admission:    Allen Yu  is a 75 y.o. male, with history of anemia, CKD, dementia, schizophrenia, DMII, HTN, PAF, PVD, and more presents to the ED with a chief complaint of dyspnea. Wife provides the history. She reports that she could tell he was feeling short of breath because he wasn't speaking in full sentences. She reports that he has had his normal chronic cough. He does not have an O2 requirement at baseline. He was hypoxic down to 75% percent at presentation. He normalized on 3 L [place].  Wife reports that the dyspnea started this morning.  He has not had any complaints.  She reports that he did have a fall a few days ago between his wheelchair and his bed where he scraped his back.  That is been the only other thing out of the norm for him.  She reports his mentation is at his baseline.  He has had no nausea or vomiting.  He has had a decrease in appetite.  She also reports that patient drinks 2 to 3 gallons of water per day.  He has been drinking this much water for the last few months.  His glucose fluctuates per her report.  He is noncompliant with his insulin, but supposed to take 30 units of insulin per night.  He also takes 2 oral hypoglycemics.  He had no fevers or chills.  He has not had any slurred speech or stuttering.  Patient is a current smoker he smokes a pack a day.  He does not drink alcohol.   He does use marijuana with the last use yesterday.  He is vaccinated for COVID.  Patient is DNR.   In the ED Temp 97.1, heart rate 30-80, respiratory rate 24, blood pressure 145/102, satting at 97% on 3 L nasal cannula pH 7.4, PCO2 39.7 Lactic acid initially over 6, down to 4.6 White blood cell count is normal at 7.4, hemoglobin 8.8 Chemistry panel reveals a creatinine of 2.21 glucose of 319 elevated gap Patient's BNP is 2873 Initial  troponin 63 downtrending to 56 Negative respiratory panel Blood cultures pending Chest x-ray shows interstitial lung markings with mild bilateral lower base infiltrates left greater than right Vancomycin, Flagyl and cefepime started Patient did have a anion gap so insulin drip was started as well   Patient was also given 20 mEq of potassium given that he was starting insulin drip Magda Paganini is given a 500 mm bolus     Hospital Course:     Brief Narrative:  75 year old male with a history of dementia, PTSD, paroxysmal atrial fibrillation, diabetes, hypertension, systolic dysfunction who is not on chronic diuretics.  He was brought to the hospital with progressive shortness of breath and acute respiratory failure.  Noted to be in decompensated CHF.  Repeat echocardiogram shows drop in ejection fraction of 25 to 30% with global hypokinesis.  He is noted to have elevated LFTs and acute kidney injury, likely related to cardiorenal/low output.  He was started on intravenous diuretics.    A/p  Acute respiratory failure with hypoxia -Related decompensated CHF -Initially requiring 3 to 4 L of oxygen -Patient has been weaned off O2 after diuresis currently remains on room air -Possibly stopped completely   Acute on chronic systolic CHF/HFrEF -Patient presented with volume overload -Does not appear to be on chronic diuretics -EF has further declined when compared to echocardiogram from 09/2020 -Current EF 25 to 30% -Did well with IV diuresis cardiology service  okay to discharge home on p.o. diuretics -Cardiology consult appreciated   Acute kidney injury Versus CKD 3A -??Related to cardiorenal syndrome and low output -Creatinine 2.2 on admission -Current creatinine 1.9 --Repeat BMP within a week advised especially while on torsemide   Elevated liver enzymes -Likely related to hepatic congestion from decreased cardiac output -Overall improved with improvement in CHF with diuresis   Type 2 diabetes -Initially presented with hyperglycemia and was started on insulin infusion -Subsequently transition to subcutaneous insulin --A1c 8.8 -Probably due to kidney concerns -Avoid over aggressive diabetic control at this time given poor oral intake   Paroxysmal atrial fibrillation -Currently on metoprolol, heart rate stable -He is not on full chronic anticoagulation as patient is currently DAPT -Records indicate that risks of anticoagulation were discussed with patient's family in the past and they elected not to pursue anticoagulation    Anemia, likely related to chronic kidney disease -Baseline hemoglobin appears to run between 7-8 -Hemoglobin is currently 8 point 8 repeat CBC as outpatient within a week advised   BPH with chronic indwelling Foley catheter   PTSD/dementia --Remeron and risperidone as needed as advised   Generalized weakness -Seen by PT with recommendations for skilled nursing facility -Discussed with his wife, she wishes to take him home with home health services. -Spoke with patient's wife prior to discharge she continues to decline SNF rehab, patient's wife is 66 years younger, she has a teenage grandkids staying with her and she also gets an aide from the [place] for 36 hours a week   Left upper extremity swelling -Check venous Dopplers   Social/Ethics--would benefit from home/outpatient palliative and hospice services, patient is [facility]   Code Status: DNR Family Communication: Spoke with patient's wife prior to  discharge she continues to decline SNF rehab Disposition Plan: Home with home health services  Discharge Condition: stable  Follow UP--PCP at the [place]   Diet and Activity recommendation:  As advised  Discharge Instructions    Discharge Instructions     Call MD for:  difficulty breathing, headache or  visual disturbances   Complete by: As directed    Call MD for:  persistant dizziness or light-headedness   Complete by: As directed    Call MD for:  persistant nausea and vomiting   Complete by: As directed    Call MD for:  temperature >100.4   Complete by: As directed    Diet general   Complete by: As directed    Discharge instructions   Complete by: As directed    1)Avoid ibuprofen/Advil/Aleve/Motrin/Goody Powders/Naproxen/BC powders/Meloxicam/Diclofenac/Indomethacin and other Nonsteroidal anti-inflammatory medications as these will make you more likely to bleed and can cause stomach ulcers, can also cause Kidney problems.   2) repeat CBC and BMP blood test within the week with primary care provider  3) outpatient palliative care follow-up advised through the [place] services  4) nutritional supplements strongly recommended   Increase activity slowly   Complete by: As directed          Discharge Medications     Allergies as of 01/19/2021       Reactions   Bee Venom Anaphylaxis   Codeine Other (See Comments)   incoherent  Other reaction(s): Delirium   Propoxyphene Other (See Comments)   Dizziness, "Makes me feel drunk" Other reaction(s): Dizziness   Valsartan Other (See Comments)   incoherent Other reaction(s): Delirium        Medication List     STOP taking these medications    cefdinir 300 MG capsule Commonly known as: OMNICEF   cefpodoxime 100 MG tablet Commonly known as: VANTIN   finasteride 5 MG tablet Commonly known as: PROSCAR   levofloxacin 500 MG tablet Commonly known as: LEVAQUIN   metFORMIN 500 MG tablet Commonly known as: GLUCOPHAGE    tamsulosin 0.4 MG Caps capsule Commonly known as: FLOMAX   triamcinolone ointment 0.1 % Commonly known as: KENALOG       TAKE these medications    acetaminophen 500 MG tablet Commonly known as: TYLENOL Take 1,000 mg by mouth every 6 (six) hours as needed for moderate pain.   Alcohol Pads 70 % Pads USE 1 PAD AS DIRECTED   aspirin EC 81 MG tablet Take 1 tablet (81 mg total) by mouth daily with breakfast. What changed:  when to take this additional instructions   atorvastatin 40 MG tablet Commonly known as: LIPITOR Take 40 mg by mouth at bedtime.   B-D UF III MINI PEN NEEDLES 31G X 5 MM Misc Generic drug: Insulin Pen Needle SMARTSIG:1 Each SUB-Q Daily   clopidogrel 75 MG tablet Commonly known as: Plavix Take 1 tablet (75 mg total) by mouth daily.   donepezil 10 MG tablet Commonly known as: ARICEPT Take 10 mg by mouth at bedtime.   feeding supplement (ENSURE COMPLETE) Liqd Take 237 mLs by mouth 2 (two) times daily between meals.   gabapentin 100 MG capsule Commonly known as: NEURONTIN Take 100 mg by mouth at bedtime.   Lantus 100 UNIT/ML injection Generic drug: insulin glargine Inject 0.06 mLs (6 Units total) into the skin at bedtime as needed (High blood glucose). If Blood glucose over 200 What changed:  how much to take additional instructions   metoprolol succinate 50 MG 24 hr tablet Commonly known as: TOPROL-XL Take 1 tablet (50 mg total) by mouth daily. Take with or immediately following a meal.   mirtazapine 15 MG tablet Commonly known as: REMERON Take 0.5 tablets (7.5 mg total) by mouth at bedtime. What changed: Another medication with the same name was removed. Continue taking  this medication, and follow the directions you see here.   multivitamin with minerals tablet Take 1 tablet by mouth daily.   NEOSPORIN EX Apply 1 application topically daily as needed (wound care).   ondansetron 4 MG tablet Commonly known as: ZOFRAN Take 1 tablet (4  mg total) by mouth every 6 (six) hours as needed for nausea.   risperiDONE 0.5 MG tablet Commonly known as: RISPERDAL Take 1 tablet (0.5 mg total) by mouth every 12 (twelve) hours as needed (Restlessness and agitation). What changed:  when to take this reasons to take this Another medication with the same name was removed. Continue taking this medication, and follow the directions you see here.   torsemide 20 MG tablet Commonly known as: DEMADEX Take 1 tablet (20 mg total) by mouth daily.   traMADol 50 MG tablet Commonly known as: ULTRAM Take 50 mg by mouth every 6 (six) hours as needed for moderate pain.   Vitamin D (Ergocalciferol) 1.25 MG (50000 UNIT) Caps capsule Commonly known as: DRISDOL Take 50,000 Units by mouth every Friday. What changed: Another medication with the same name was removed. Continue taking this medication, and follow the directions you see here.        Major procedures and Radiology Reports - PLEASE review detailed and final reports for all details, in brief -     US Venous Img Upper Uni Left (DVT)  Result Date: 01/18/2021 CLINICAL DATA:  LEFT upper extremity edema. EXAM: LEFT UPPER EXTREMITY VENOUS DOPPLER ULTRASOUND TECHNIQUE: Gray-scale sonography with graded compression, as well as color Doppler and duplex ultrasound were performed to evaluate the upper extremity deep venous system from the level of the subclavian vein and including the jugular, axillary, basilic, radial, ulnar and upper cephalic vein. Spectral Doppler was utilized to evaluate flow at rest and with distal augmentation maneuvers. COMPARISON:  Chest XR, 01/13/2021.  CTA chest, 06/29/2020. FINDINGS: VENOUS Normal compressibility of the LEFT internal jugular, subclavian, axillary, cephalic, basilic, brachial, radial and ulnar veins. No filling defects to suggest DVT on grayscale or color Doppler imaging. Doppler waveforms show normal direction of venous flow, normal respiratory plasticity and  response to augmentation. Limited views of the contralateral subclavian vein are unremarkable. OTHER Incidental, LEFT carotid atherosclerosis.  Incompletely assessed. Subcutaneous edema, within the imaged LEFT upper extremity. Incompletely imaged. Limitations: none IMPRESSION: No evidence of DVT within the LEFT upper extremity. Michaelle Birks, MD [facility] Radiology Electronically Signed   By: Michaelle Birks M.D.   On: 01/18/2021 12:04   DG Chest Portable 1 View  Result Date: 01/13/2021 CLINICAL DATA:  Shortness of breath. EXAM: PORTABLE CHEST 1 VIEW COMPARISON:  September 27, 2020 FINDINGS: Stable moderate to marked severity diffuse chronic appearing increased interstitial lung markings are seen. Mild bilateral lower lobe infiltrates are seen, left greater than right. There is a small left pleural effusion. No pneumothorax is identified. The cardiac silhouette is enlarged and unchanged in size. There is moderate severity calcification of the aortic arch. A radiopaque fusion plate and screws are seen overlying the cervical spine. IMPRESSION: 1. Stable moderate to marked severity diffuse chronic appearing increased interstitial lung markings with mild bilateral lower lobe infiltrates, left greater than right. 2. Small left pleural effusion. Electronically Signed   By: Virgina Norfolk M.D.   On: 01/13/2021 19:10   ECHOCARDIOGRAM COMPLETE  Result Date: 01/14/2021    ECHOCARDIOGRAM REPORT   Patient Name:   Allen Yu Date of Exam: 01/14/2021 Medical Rec #:  [id number]      Height:       73.0 in Accession #:    [id number]     Weight:       120.4 lb Date of Birth:  [birth year]     BSA:          1.734 m Patient Age:    25 years       BP:           116/90 mmHg Patient Gender: M              HR:           77 bpm. Exam Location:  [facility] Procedure: 2D Echo, Cardiac Doppler and Color Doppler Indications:    Congestive Heart Failure I50.9  History:        Patient  has prior history of Echocardiogram examinations, most                 recent 09/27/2020. CHF, COPD, Arrythmias:Atrial Fibrillation; Risk                 Factors:Diabetes and Hypertension. PTSD, dementia,                 schizophrenia.  Sonographer:    Alvino Chapel RCS Referring Phys: [id number] ASIA B [place]  Sonographer Comments: Patient confused/combative at times. IMPRESSIONS  1. Left ventricular ejection fraction, by estimation, is 25 to 30%. The left ventricle has severely decreased function. The left ventricle demonstrates global hypokinesis with some regional variation. There is moderate left ventricular hypertrophy. Left  ventricular diastolic parameters are indeterminate.  2. Right ventricular systolic function is mildly reduced. The right ventricular size is normal. There is severely elevated pulmonary artery systolic pressure. The estimated right ventricular systolic pressure is 10.1 mmHg.  3. Left atrial size was moderately dilated.  4. Right atrial size was moderately dilated.  5. There is a trivial pericardial effusion posterior to the left ventricle and localized near the right atrium.  6. The mitral valve is degenerative. Moderate mitral valve regurgitation.  7. Tricuspid valve regurgitation is moderate.  8. The aortic valve is tricuspid. There is mild calcification of the aortic valve. Aortic valve regurgitation is not visualized.  9. The inferior vena cava is dilated in size with <50% respiratory variability, suggesting right atrial pressure of 15 mmHg. Comparison(s): Prior images reviewed side by side. LVEF has decreased further and estimated PASP is higher. FINDINGS  Left Ventricle: Left ventricular ejection fraction, by estimation, is 25 to 30%. The left ventricle has severely decreased function. The left ventricle demonstrates global hypokinesis. The left ventricular internal cavity size was normal in size. There is moderate left ventricular hypertrophy. Left ventricular diastolic parameters  are indeterminate. Right Ventricle: The right ventricular size is normal. No increase in right ventricular wall thickness. Right ventricular systolic function is mildly reduced. There is severely elevated pulmonary artery systolic pressure. The tricuspid regurgitant velocity is 3.56 m/s, and with an assumed right atrial pressure of 15 mmHg, the estimated right ventricular systolic pressure is 75.1 mmHg. Left Atrium: Left atrial size was moderately dilated. Right Atrium: Right atrial size was moderately dilated. Pericardium: Trivial pericardial effusion is present. The pericardial effusion is posterior to the left ventricle and localized near the right atrium. Mitral Valve: The mitral valve is degenerative in appearance. There is mild thickening of the mitral valve leaflet(s). Moderate mitral valve regurgitation. Tricuspid Valve: The tricuspid valve is grossly normal. Tricuspid valve regurgitation is moderate. Aortic Valve: The aortic valve  is tricuspid. There is mild calcification of the aortic valve. There is mild aortic valve annular calcification. Aortic valve regurgitation is not visualized. Pulmonic Valve: The pulmonic valve was grossly normal. Pulmonic valve regurgitation is trivial. Aorta: The aortic root is normal in size and structure. Venous: The inferior vena cava is dilated in size with less than 50% respiratory variability, suggesting right atrial pressure of 15 mmHg. IAS/Shunts: No atrial level shunt detected by color flow Doppler. Additional Comments: There is pleural effusion in the left lateral region.  LEFT VENTRICLE PLAX 2D LVIDd:         5.20 cm LVIDs:         4.40 cm LV PW:         1.50 cm LV IVS:        1.30 cm LVOT diam:     1.90 cm LV SV:         27 LV SV Index:   15 LVOT Area:     2.84 cm  RIGHT VENTRICLE TAPSE (M-mode): 1.2 cm LEFT ATRIUM             Index        RIGHT ATRIUM           Index LA diam:        3.90 cm 2.25 cm/m   RA Area:     23.70 cm LA Vol (A2C):   82.1 ml 47.35 ml/m  RA  Volume:   79.70 ml  45.96 ml/m LA Vol (A4C):   83.5 ml 48.15 ml/m LA Biplane Vol: 85.9 ml 49.54 ml/m  AORTIC VALVE LVOT Vmax:   54.00 cm/s LVOT Vmean:  34.800 cm/s LVOT VTI:    0.094 m  AORTA Ao Root diam: 3.80 cm MITRAL VALVE                  TRICUSPID VALVE MV Area (PHT): 2.91 cm       TR Peak grad:   50.7 mmHg MV Decel Time: 261 msec       TR Vmax:        356.00 cm/s MR Peak grad:    71.6 mmHg MR Mean grad:    51.0 mmHg    SHUNTS MR Vmax:         423.00 cm/s  Systemic VTI:  0.09 m MR Vmean:        334.0 cm/s   Systemic Diam: 1.90 cm MR PISA:         4.02 cm MR PISA Eff ROA: 30 mm MR PISA Radius:  0.80 cm MV E velocity: 88.10 cm/s Rozann Lesches MD Electronically signed by Rozann Lesches MD Signature Date/Time: 01/14/2021/2:11:21 PM    Final     Micro Results   Recent Results (from the past 240 hour(s))  Resp Panel by RT-PCR (Flu A&B, Covid) Nasopharyngeal Swab     Status: None   Collection Time: 01/13/21  7:15 PM   Specimen: Nasopharyngeal Swab; Nasopharyngeal(NP) swabs in vial transport medium  Result Value Ref Range Status   SARS Coronavirus 2 by RT PCR NEGATIVE NEGATIVE Final    Comment: (NOTE) SARS-CoV-2 target nucleic acids are NOT DETECTED.  The SARS-CoV-2 RNA is generally detectable in upper respiratory specimens during the acute phase of infection. The lowest concentration of SARS-CoV-2 viral copies this assay can detect is 138 copies/mL. A negative result does not preclude SARS-Cov-2 infection and should not be used as the sole basis for treatment or other patient management decisions. A negative result  may occur with  improper specimen collection/handling, submission of specimen other than nasopharyngeal swab, presence of viral mutation(s) within the areas targeted by this assay, and inadequate number of viral copies(<138 copies/mL). A negative result must be combined with clinical observations, patient history, and epidemiological information. The expected result is  Negative.  Fact Sheet for Patients:  EntrepreneurPulse.com.au  Fact Sheet for Healthcare Providers:  IncredibleEmployment.be  This test is no t yet approved or cleared by the [place] FDA and  has been authorized for detection and/or diagnosis of SARS-CoV-2 by FDA under an Emergency Use Authorization (EUA). This EUA will remain  in effect (meaning this test can be used) for the duration of the COVID-19 declaration under Section 564(b)(1) of the Act, 21 U.S.C.section 360bbb-3(b)(1), unless the authorization is terminated  or revoked sooner.       Influenza A by PCR NEGATIVE NEGATIVE Final   Influenza B by PCR NEGATIVE NEGATIVE Final    Comment: (NOTE) The Xpert Xpress SARS-CoV-2/FLU/RSV plus assay is intended as an aid in the diagnosis of influenza from Nasopharyngeal swab specimens and should not be used as a sole basis for treatment. Nasal washings and aspirates are unacceptable for Xpert Xpress SARS-CoV-2/FLU/RSV testing.  Fact Sheet for Patients: EntrepreneurPulse.com.au  Fact Sheet for Healthcare Providers: IncredibleEmployment.be  This test is not yet approved or cleared by the [place] FDA and has been authorized for detection and/or diagnosis of SARS-CoV-2 by FDA under an Emergency Use Authorization (EUA). This EUA will remain in effect (meaning this test can be used) for the duration of the COVID-19 declaration under Section 564(b)(1) of the Act, 21 U.S.C. section 360bbb-3(b)(1), unless the authorization is terminated or revoked.  Performed at [facility], [address], [place], [place] [address]   Culture, blood (routine x 2)     Status: None   Collection Time: 01/13/21  8:25 PM   Specimen: Right Antecubital; Blood  Result Value Ref Range Status   Specimen Description   Final    RIGHT ANTECUBITAL BOTTLES DRAWN AEROBIC AND ANAEROBIC   Special Requests Blood Culture adequate  volume  Final   Culture   Final    NO GROWTH 5 DAYS Performed at [facility], [address], [place], [place] [address]    Report Status 01/18/2021 FINAL  Final  Culture, blood (routine x 2)     Status: None   Collection Time: 01/13/21  8:33 PM   Specimen: BLOOD RIGHT ARM  Result Value Ref Range Status   Specimen Description BLOOD RIGHT ARM  Final   Special Requests   Final    BOTTLES DRAWN AEROBIC ONLY Blood Culture results may not be optimal due to an inadequate volume of blood received in culture bottles   Culture   Final    NO GROWTH 5 DAYS Performed at [facility], [address], [place], [place] [address]    Report Status 01/18/2021 FINAL  Final  Urine Culture     Status: Abnormal   Collection Time: 01/13/21 10:04 PM   Specimen: Urine, Catheterized  Result Value Ref Range Status   Specimen Description   Final    URINE, CATHETERIZED Performed at [facility], [address], [place], [place] [address]    Special Requests   Final    NONE Performed at [facility], [address], [place],  [address]    Culture (A)  Final    50,000 COLONIES/mL ENTEROBACTER CLOACAE 70,000 COLONIES/mL ENTEROCOCCUS FAECALIS    Report Status 01/16/2021 FINAL  Final   Organism ID, Bacteria ENTEROBACTER CLOACAE (A)  Final   Organism ID, Bacteria ENTEROCOCCUS FAECALIS (A)  Final      Susceptibility   Enterobacter cloacae - MIC*    CEFAZOLIN RESISTANT Resistant     CEFEPIME <=0.12 SENSITIVE Sensitive     CIPROFLOXACIN 1 RESISTANT Resistant     GENTAMICIN <=1 SENSITIVE Sensitive     IMIPENEM <=0.25 SENSITIVE Sensitive     NITROFURANTOIN 64 INTERMEDIATE Intermediate     TRIMETH/SULFA >=320 RESISTANT Resistant     PIP/TAZO <=4 SENSITIVE Sensitive     * 50,000 COLONIES/mL ENTEROBACTER CLOACAE   Enterococcus faecalis - MIC*    AMPICILLIN <=2 SENSITIVE Sensitive     NITROFURANTOIN <=16 SENSITIVE Sensitive     VANCOMYCIN 1 SENSITIVE Sensitive     * 70,000 COLONIES/mL ENTEROCOCCUS  FAECALIS  MRSA Next Gen by PCR, Nasal     Status: None   Collection Time: 01/14/21  2:24 PM   Specimen: Nasal Mucosa; Nasal Swab  Result Value Ref Range Status   MRSA by PCR Next Gen NOT DETECTED NOT DETECTED Final    Comment: (NOTE) The GeneXpert MRSA Assay (FDA approved for NASAL specimens only), is one component of a comprehensive MRSA colonization surveillance program. It is not intended to diagnose MRSA infection nor to guide or monitor treatment for MRSA infections. Test performance is not FDA approved in patients less than 29 years old. Performed at [facility], [address] [place], [place] [address]    Today   Subjective    Allen Yu today has no new complaints, - Spoke with patient's wife prior to discharge she continues to decline SNF rehab  -Oral intake is not great      Patient has been seen and examined prior to discharge   Objective   Blood pressure 102/71, pulse 80, temperature 97.8 F (36.6 C), resp. rate 13, height 6' (1.829 m), weight 52.6 kg, SpO2 97 %.   Intake/Output Summary (Last 24 hours) at 01/19/2021 1643 Last data filed at 01/19/2021 1404 Gross per 24 hour  Intake 240 ml  Output 2500 ml  Net -2260 ml    Exam Gen:- Awake Alert, no acute distress  HEENT:- [place].AT, No sclera icterus Neck-Supple Neck,No JVD,.  Lungs-improved air movement, no wheezing or rales  CV- S1, S2 normal, irregular Abd-  +ve B.Sounds, Abd Soft, No tenderness,    Extremity/Skin:- significant muscle atrophy/wasting especially of the right leg, left AKA stump well-healed, left upper extremity swelling is improving Psych--pleasantly confused and disoriented from time to time neuro-generalized weakness, no new focal deficits, no tremors    Data Review   CBC w Diff:  Lab Results  Component Value Date   WBC 7.9 01/19/2021   HGB 8.8 (L) 01/19/2021   HCT 27.1 (L) 01/19/2021   HCT 31.3 (L) 07/09/2015   PLT 336 01/19/2021   LYMPHOPCT 15 01/14/2021   BANDSPCT 10  09/25/2020   MONOPCT 7 01/14/2021   EOSPCT 0 01/14/2021   BASOPCT 0 01/14/2021    CMP:  Lab Results  Component Value Date   NA 139 01/19/2021   K 3.9 01/19/2021   CL 101 01/19/2021   CO2 27 01/19/2021   BUN 46 (H) 01/19/2021   CREATININE 1.96 (H) 01/19/2021   CREATININE 1.10 11/26/2019   PROT 6.0 (L) 01/17/2021   ALBUMIN 2.8 (L) 01/18/2021   BILITOT 1.2 01/17/2021   ALKPHOS 174 (H) 01/17/2021   AST 60 (H) 01/17/2021   ALT 79 (H) 01/17/2021  .  Total Discharge time is about 33 minutes  Roxan Hockey M.D on 01/19/2021 at 4:43 PM  Go to [email or web address] -  for contact info  [facility] - Office  [phone]

## 2021-01-19 NOTE — Discharge Instructions (Signed)
1)Avoid ibuprofen/Advil/Aleve/Motrin/Goody Powders/Naproxen/BC powders/Meloxicam/Diclofenac/Indomethacin and other Nonsteroidal anti-inflammatory medications as these will make you more likely to bleed and can cause stomach ulcers, can also cause Kidney problems.   2) repeat CBC and BMP blood test within the week with primary care provider  3) outpatient palliative care follow-up advised through the [place] services  4) nutritional supplements strongly recommended

## 2021-01-19 NOTE — Progress Notes (Signed)
Notified provider of CBG of 403. Per provider to give coverage for 400. Lab order to draw Blood glucose. Result of 396. Patient given insulin, but refused to take Atorvastatin, Donepezil, Mirtazapine and Risperidone. Charge nurse aware and provider notified.

## 2021-01-23 ENCOUNTER — Inpatient Hospital Stay (HOSPITAL_COMMUNITY)
Admission: EM | Admit: 2021-01-23 | Discharge: 2021-01-25 | DRG: 291 | Disposition: A | Discharge status: Home / Self Care | Payer: Medicare Other | Attending: Internal Medicine | Admitting: Internal Medicine

## 2021-01-23 ENCOUNTER — Other Ambulatory Visit: Payer: Self-pay

## 2021-01-23 ENCOUNTER — Emergency Department (HOSPITAL_COMMUNITY): Payer: Medicare Other

## 2021-01-23 ENCOUNTER — Encounter (HOSPITAL_COMMUNITY): Payer: Self-pay | Admitting: Emergency Medicine

## 2021-01-23 DIAGNOSIS — Z79899 Other long term (current) drug therapy: Secondary | ICD-10-CM

## 2021-01-23 DIAGNOSIS — Z66 Do not resuscitate: Secondary | ICD-10-CM | POA: Diagnosis present

## 2021-01-23 DIAGNOSIS — F03918 Unspecified dementia, unspecified severity, with other behavioral disturbance: Secondary | ICD-10-CM | POA: Diagnosis present

## 2021-01-23 DIAGNOSIS — R069 Unspecified abnormalities of breathing: Secondary | ICD-10-CM | POA: Diagnosis not present

## 2021-01-23 DIAGNOSIS — I480 Paroxysmal atrial fibrillation: Secondary | ICD-10-CM | POA: Diagnosis present

## 2021-01-23 DIAGNOSIS — Z743 Need for continuous supervision: Secondary | ICD-10-CM | POA: Diagnosis not present

## 2021-01-23 DIAGNOSIS — E440 Moderate protein-calorie malnutrition: Secondary | ICD-10-CM | POA: Diagnosis present

## 2021-01-23 DIAGNOSIS — F4310 Post-traumatic stress disorder, unspecified: Secondary | ICD-10-CM | POA: Diagnosis not present

## 2021-01-23 DIAGNOSIS — F17210 Nicotine dependence, cigarettes, uncomplicated: Secondary | ICD-10-CM | POA: Diagnosis present

## 2021-01-23 DIAGNOSIS — E1165 Type 2 diabetes mellitus with hyperglycemia: Secondary | ICD-10-CM | POA: Diagnosis present

## 2021-01-23 DIAGNOSIS — J9601 Acute respiratory failure with hypoxia: Secondary | ICD-10-CM | POA: Diagnosis present

## 2021-01-23 DIAGNOSIS — I130 Hypertensive heart and chronic kidney disease with heart failure and stage 1 through stage 4 chronic kidney disease, or unspecified chronic kidney disease: Secondary | ICD-10-CM | POA: Diagnosis present

## 2021-01-23 DIAGNOSIS — I420 Dilated cardiomyopathy: Secondary | ICD-10-CM | POA: Diagnosis not present

## 2021-01-23 DIAGNOSIS — F209 Schizophrenia, unspecified: Secondary | ICD-10-CM | POA: Diagnosis present

## 2021-01-23 DIAGNOSIS — M1990 Unspecified osteoarthritis, unspecified site: Secondary | ICD-10-CM | POA: Diagnosis present

## 2021-01-23 DIAGNOSIS — Z20822 Contact with and (suspected) exposure to covid-19: Secondary | ICD-10-CM | POA: Diagnosis present

## 2021-01-23 DIAGNOSIS — J449 Chronic obstructive pulmonary disease, unspecified: Secondary | ICD-10-CM | POA: Diagnosis present

## 2021-01-23 DIAGNOSIS — S78112A Complete traumatic amputation at level between left hip and knee, initial encounter: Secondary | ICD-10-CM | POA: Diagnosis not present

## 2021-01-23 DIAGNOSIS — I739 Peripheral vascular disease, unspecified: Secondary | ICD-10-CM

## 2021-01-23 DIAGNOSIS — I10 Essential (primary) hypertension: Secondary | ICD-10-CM | POA: Diagnosis not present

## 2021-01-23 DIAGNOSIS — E1151 Type 2 diabetes mellitus with diabetic peripheral angiopathy without gangrene: Secondary | ICD-10-CM | POA: Diagnosis present

## 2021-01-23 DIAGNOSIS — Z7902 Long term (current) use of antithrombotics/antiplatelets: Secondary | ICD-10-CM

## 2021-01-23 DIAGNOSIS — Z681 Body mass index (BMI) 19 or less, adult: Secondary | ICD-10-CM

## 2021-01-23 DIAGNOSIS — E1122 Type 2 diabetes mellitus with diabetic chronic kidney disease: Secondary | ICD-10-CM | POA: Diagnosis present

## 2021-01-23 DIAGNOSIS — J90 Pleural effusion, not elsewhere classified: Secondary | ICD-10-CM | POA: Diagnosis not present

## 2021-01-23 DIAGNOSIS — N179 Acute kidney failure, unspecified: Secondary | ICD-10-CM | POA: Diagnosis not present

## 2021-01-23 DIAGNOSIS — N400 Enlarged prostate without lower urinary tract symptoms: Secondary | ICD-10-CM | POA: Diagnosis present

## 2021-01-23 DIAGNOSIS — R4189 Other symptoms and signs involving cognitive functions and awareness: Secondary | ICD-10-CM | POA: Diagnosis present

## 2021-01-23 DIAGNOSIS — I4891 Unspecified atrial fibrillation: Secondary | ICD-10-CM

## 2021-01-23 DIAGNOSIS — N1832 Chronic kidney disease, stage 3b: Secondary | ICD-10-CM | POA: Diagnosis not present

## 2021-01-23 DIAGNOSIS — R0602 Shortness of breath: Secondary | ICD-10-CM | POA: Diagnosis not present

## 2021-01-23 DIAGNOSIS — Z8249 Family history of ischemic heart disease and other diseases of the circulatory system: Secondary | ICD-10-CM

## 2021-01-23 DIAGNOSIS — I5023 Acute on chronic systolic (congestive) heart failure: Secondary | ICD-10-CM | POA: Diagnosis present

## 2021-01-23 DIAGNOSIS — Z89612 Acquired absence of left leg above knee: Secondary | ICD-10-CM

## 2021-01-23 DIAGNOSIS — Z7982 Long term (current) use of aspirin: Secondary | ICD-10-CM

## 2021-01-23 DIAGNOSIS — J9602 Acute respiratory failure with hypercapnia: Secondary | ICD-10-CM | POA: Diagnosis not present

## 2021-01-23 DIAGNOSIS — E1142 Type 2 diabetes mellitus with diabetic polyneuropathy: Secondary | ICD-10-CM | POA: Diagnosis present

## 2021-01-23 DIAGNOSIS — E785 Hyperlipidemia, unspecified: Secondary | ICD-10-CM | POA: Diagnosis present

## 2021-01-23 DIAGNOSIS — N1831 Chronic kidney disease, stage 3a: Secondary | ICD-10-CM | POA: Diagnosis not present

## 2021-01-23 DIAGNOSIS — N183 Chronic kidney disease, stage 3 unspecified: Secondary | ICD-10-CM

## 2021-01-23 DIAGNOSIS — Z888 Allergy status to other drugs, medicaments and biological substances status: Secondary | ICD-10-CM

## 2021-01-23 DIAGNOSIS — R0600 Dyspnea, unspecified: Secondary | ICD-10-CM | POA: Diagnosis not present

## 2021-01-23 DIAGNOSIS — Z91030 Bee allergy status: Secondary | ICD-10-CM

## 2021-01-23 DIAGNOSIS — Z885 Allergy status to narcotic agent status: Secondary | ICD-10-CM

## 2021-01-23 DIAGNOSIS — Z794 Long term (current) use of insulin: Secondary | ICD-10-CM

## 2021-01-23 DIAGNOSIS — K5900 Constipation, unspecified: Secondary | ICD-10-CM | POA: Diagnosis present

## 2021-01-23 LAB — BLOOD GAS, VENOUS
Acid-Base Excess: 4.9 mmol/L — ABNORMAL HIGH (ref 0.0–2.0)
Acid-Base Excess: 5.2 mmol/L — ABNORMAL HIGH (ref 0.0–2.0)
Bicarbonate: 27.4 mmol/L (ref 20.0–28.0)
Bicarbonate: 27 mmol/L (ref 20.0–28.0)
Drawn by: 6352
Drawn by: 61882
FIO2: 45
FIO2: 45
O2 Saturation: 55.9 %
O2 Saturation: 48.6 %
Patient temperature: 37
Patient temperature: 36.5
pCO2, Ven: 60.4 mmHg — ABNORMAL HIGH (ref 44.0–60.0)
pCO2, Ven: 58.7 mmHg (ref 44.0–60.0)
pH, Ven: 7.325 (ref 7.250–7.430)
pH, Ven: 7.337 (ref 7.250–7.430)
pO2, Ven: 38.6 mmHg (ref 32.0–45.0)
pO2, Ven: 33.1 mmHg (ref 32.0–45.0)

## 2021-01-23 LAB — CBC
HCT: 27.3 % — ABNORMAL LOW (ref 39.0–52.0)
Hemoglobin: 8.8 g/dL — ABNORMAL LOW (ref 13.0–17.0)
MCH: 32.5 pg (ref 26.0–34.0)
MCHC: 32.2 g/dL (ref 30.0–36.0)
MCV: 100.7 fL — ABNORMAL HIGH (ref 80.0–100.0)
Platelets: 316 10*3/uL (ref 150–400)
RBC: 2.71 MIL/uL — ABNORMAL LOW (ref 4.22–5.81)
RDW: 16.3 % — ABNORMAL HIGH (ref 11.5–15.5)
WBC: 10.8 10*3/uL — ABNORMAL HIGH (ref 4.0–10.5)
nRBC: 0 % (ref 0.0–0.2)

## 2021-01-23 LAB — BASIC METABOLIC PANEL
Anion gap: 10 (ref 5–15)
BUN: 34 mg/dL — ABNORMAL HIGH (ref 8–23)
CO2: 28 mmol/L (ref 22–32)
Calcium: 8.7 mg/dL — ABNORMAL LOW (ref 8.9–10.3)
Chloride: 98 mmol/L (ref 98–111)
Creatinine, Ser: 1.6 mg/dL — ABNORMAL HIGH (ref 0.61–1.24)
GFR, Estimated: 45 mL/min — ABNORMAL LOW (ref >60)
Glucose, Bld: 413 mg/dL — ABNORMAL HIGH (ref 70–99)
Potassium: 4.9 mmol/L (ref 3.5–5.1)
Sodium: 136 mmol/L (ref 135–145)

## 2021-01-23 LAB — RESP PANEL BY RT-PCR (FLU A&B, COVID) ARPGX2
Influenza A by PCR: NEGATIVE
Influenza B by PCR: NEGATIVE
SARS Coronavirus 2 by RT PCR: NEGATIVE

## 2021-01-23 LAB — BRAIN NATRIURETIC PEPTIDE: B Natriuretic Peptide: 1512 pg/mL — ABNORMAL HIGH (ref 0.0–100.0)

## 2021-01-23 LAB — LACTIC ACID, PLASMA
Lactic Acid, Venous: 1.9 mmol/L (ref 0.5–1.9)
Lactic Acid, Venous: 2.6 mmol/L (ref 0.5–1.9)

## 2021-01-23 LAB — PROCALCITONIN: Procalcitonin: <0.1 ng/mL

## 2021-01-23 LAB — BETA-HYDROXYBUTYRIC ACID: Beta-Hydroxybutyric Acid: 0.1 mmol/L (ref 0.05–0.27)

## 2021-01-23 LAB — MAGNESIUM: Magnesium: 1.6 mg/dL — ABNORMAL LOW (ref 1.7–2.4)

## 2021-01-23 MED ORDER — ACETAMINOPHEN 325 MG PO TABS: 650.0000 mg | ORAL_TABLET | ORAL | Status: DC | PRN

## 2021-01-23 MED ORDER — GABAPENTIN 100 MG PO CAPS
100.0000 mg | ORAL_CAPSULE | Freq: Every day | ORAL | Status: DC
  Administered 2021-01-23 – 2021-01-24 (×2): 100 mg via ORAL
  Filled 2021-01-23 (×2): qty 1

## 2021-01-23 MED ORDER — METOPROLOL SUCCINATE ER 50 MG PO TB24
50.0000 mg | ORAL_TABLET | Freq: Every day | ORAL | Status: DC
  Administered 2021-01-24: 50 mg via ORAL
  Filled 2021-01-23 (×2): qty 1

## 2021-01-23 MED ORDER — ENOXAPARIN SODIUM 40 MG/0.4ML IJ SOSY
40.0000 mg | PREFILLED_SYRINGE | INTRAMUSCULAR | Status: DC
  Administered 2021-01-23 – 2021-01-24 (×2): 40 mg via SUBCUTANEOUS
  Filled 2021-01-23 (×2): qty 0.4

## 2021-01-23 MED ORDER — SODIUM CHLORIDE 0.9% FLUSH: 3.0000 mL | INTRAVENOUS | Status: DC | PRN

## 2021-01-23 MED ORDER — SODIUM CHLORIDE 0.9 % IV SOLN
1.0000 g | Freq: Once | INTRAVENOUS | Status: AC
  Administered 2021-01-23: 1 g via INTRAVENOUS
  Filled 2021-01-23: qty 10

## 2021-01-23 MED ORDER — FUROSEMIDE 10 MG/ML IJ SOLN
40.0000 mg | Freq: Once | INTRAMUSCULAR | Status: AC
  Administered 2021-01-23: 40 mg via INTRAVENOUS
  Filled 2021-01-23: qty 4

## 2021-01-23 MED ORDER — SODIUM CHLORIDE 0.9 % IV SOLN: 250.0000 mL | INTRAVENOUS | Status: DC | PRN

## 2021-01-23 MED ORDER — SODIUM CHLORIDE 0.9% FLUSH
3.0000 mL | Freq: Two times a day (BID) | INTRAVENOUS | Status: DC
  Administered 2021-01-23 – 2021-01-25 (×4): 3 mL via INTRAVENOUS

## 2021-01-23 MED ORDER — RISPERIDONE 0.5 MG PO TABS
0.5000 mg | ORAL_TABLET | Freq: Two times a day (BID) | ORAL | Status: DC | PRN
  Administered 2021-01-23 – 2021-01-24 (×2): 0.5 mg via ORAL
  Filled 2021-01-23 (×2): qty 1

## 2021-01-23 MED ORDER — SODIUM CHLORIDE 0.9 % IV SOLN
500.0000 mg | Freq: Once | INTRAVENOUS | Status: AC
  Administered 2021-01-23: 500 mg via INTRAVENOUS
  Filled 2021-01-23: qty 5

## 2021-01-23 MED ORDER — VANCOMYCIN HCL IN DEXTROSE 1-5 GM/200ML-% IV SOLN
1000.0000 mg | Freq: Once | INTRAVENOUS | Status: AC
  Administered 2021-01-23: 1000 mg via INTRAVENOUS
  Filled 2021-01-23: qty 200

## 2021-01-23 MED ORDER — ATORVASTATIN CALCIUM 40 MG PO TABS
40.0000 mg | ORAL_TABLET | Freq: Every day | ORAL | Status: DC
Start: 2021-01-23 — End: 2021-01-25
  Administered 2021-01-23 – 2021-01-24 (×2): 40 mg via ORAL
  Filled 2021-01-23 (×2): qty 1

## 2021-01-23 MED ORDER — HALOPERIDOL LACTATE 5 MG/ML IJ SOLN
2.0000 mg | Freq: Four times a day (QID) | INTRAMUSCULAR | Status: DC | PRN
  Administered 2021-01-23 – 2021-01-24 (×2): 2 mg via INTRAVENOUS
  Filled 2021-01-23 (×3): qty 1

## 2021-01-23 MED ORDER — CLOPIDOGREL BISULFATE 75 MG PO TABS
75.0000 mg | ORAL_TABLET | Freq: Every day | ORAL | Status: DC
  Administered 2021-01-24: 75 mg via ORAL
  Filled 2021-01-23 (×2): qty 1

## 2021-01-23 MED ORDER — INSULIN ASPART 100 UNIT/ML IJ SOLN
0.0000 [IU] | Freq: Three times a day (TID) | INTRAMUSCULAR | Status: DC
  Administered 2021-01-24 (×2): 5 [IU] via SUBCUTANEOUS
  Administered 2021-01-24 – 2021-01-25 (×2): 2 [IU] via SUBCUTANEOUS
  Administered 2021-01-25: 5 [IU] via SUBCUTANEOUS

## 2021-01-23 MED ORDER — MIRTAZAPINE 15 MG PO TABS
7.5000 mg | ORAL_TABLET | Freq: Every day | ORAL | Status: DC
  Administered 2021-01-23 – 2021-01-24 (×2): 7.5 mg via ORAL
  Filled 2021-01-23 (×2): qty 1

## 2021-01-23 MED ORDER — LORAZEPAM 2 MG/ML IJ SOLN
0.5000 mg | Freq: Once | INTRAMUSCULAR | Status: AC
  Administered 2021-01-23: 0.5 mg via INTRAVENOUS
  Filled 2021-01-23: qty 1

## 2021-01-23 MED ORDER — INSULIN ASPART 100 UNIT/ML IJ SOLN
0.0000 [IU] | Freq: Every day | INTRAMUSCULAR | Status: DC
  Administered 2021-01-23 – 2021-01-24 (×2): 3 [IU] via SUBCUTANEOUS

## 2021-01-23 MED ORDER — ONDANSETRON HCL 4 MG/2ML IJ SOLN: 4.0000 mg | Freq: Four times a day (QID) | INTRAMUSCULAR | Status: DC | PRN

## 2021-01-23 MED ORDER — TRAMADOL HCL 50 MG PO TABS
50.0000 mg | ORAL_TABLET | Freq: Four times a day (QID) | ORAL | Status: DC | PRN
Start: 2021-01-23 — End: 2021-01-25

## 2021-01-23 MED ORDER — HALOPERIDOL LACTATE 5 MG/ML IJ SOLN
2.0000 mg | Freq: Once | INTRAMUSCULAR | Status: AC
Start: 2021-01-23 — End: 2021-01-23
  Administered 2021-01-23: 2 mg via INTRAMUSCULAR

## 2021-01-23 MED ORDER — ASPIRIN EC 81 MG PO TBEC
81.0000 mg | DELAYED_RELEASE_TABLET | Freq: Every day | ORAL | Status: DC
  Administered 2021-01-24: 81 mg via ORAL
  Filled 2021-01-23 (×2): qty 1

## 2021-01-23 MED ORDER — SODIUM CHLORIDE 0.9 % IV SOLN
2.0000 g | Freq: Once | INTRAVENOUS | Status: AC
  Administered 2021-01-23: 2 g via INTRAVENOUS
  Filled 2021-01-23: qty 2

## 2021-01-23 NOTE — ED Provider Notes (Signed)
[facility] EMERGENCY DEPARTMENT Provider Note   CSN: [id number] Arrival date & time: 01/23/21  7782     History  Chief Complaint  Patient presents with   Shortness of Breath    Allen Yu is a 76 y.o. male.  The history is provided by the patient and the spouse. The history is limited by the condition of the patient.  Shortness of Breath Associated symptoms: no abdominal pain, no chest pain, no cough, no fever, no headaches, no rash, no vomiting and no wheezing        Allen Yu is a 76 y.o. male with past medical history of type 2 diabetes, schizophrenia, hypertension, GERD, anemia, PTSD, dementia, paroxysmal A. fib and CKD who presents to the Emergency Department via EMS from home with complaint of increased shortness of breath.  Patient was seen in this emergency department on 01/13/2021 and admitted for acute respiratory failure with hypoxia and diabetic ketoacidosis.  He was discharged on 01/19/2021 to home with plans for home health.   he was started on furosemide.   Most of the history provided by patient's spouse, states that he was doing well after his discharge from the hospital until last evening.  Spouse states that he began complaining of "something grabbing me" she is unsure what he meant by this.  She noticed that his breathing was somewhat labored and that he sounded congested.  His symptoms were worse this morning which prompted her to contact EMS.  He has not had his routine medications this morning.  Patient denies any pain at this time.  No reported fever at home.    Patient is DNR.  Surgical history includes: Bladder surgery, appendectomy, cholecystectomy, surgery to the right shoulder, inguinal hernia repair, abdominal aortogram with lower extremity, femoropopliteal bypass graft, left AKA  Social history is pertinent for daily tobacco use   Home Medications Prior to Admission medications   Medication Sig Start Date End Date Taking? Authorizing  Provider  acetaminophen (TYLENOL) 500 MG tablet Take 1,000 mg by mouth every 6 (six) hours as needed for moderate pain.    [provider]  Alcohol Swabs (ALCOHOL PADS) 70 % PADS USE 1 PAD AS DIRECTED 10/12/20 10/13/21  [provider]  aspirin EC 81 MG tablet Take 1 tablet (81 mg total) by mouth daily with breakfast. 01/19/21 01/19/22  Roxan Hockey, MD  atorvastatin (LIPITOR) 40 MG tablet Take 40 mg by mouth at bedtime.    [provider]  B-D UF III MINI PEN NEEDLES 31G X 5 MM MISC SMARTSIG:1 Each SUB-Q Daily 12/23/19   [provider]  Bacitracin-Polymyxin B (NEOSPORIN EX) Apply 1 application topically daily as needed (wound care).    [provider]  clopidogrel (PLAVIX) 75 MG tablet Take 1 tablet (75 mg total) by mouth daily. 01/19/21 01/19/22  Roxan Hockey, MD  donepezil (ARICEPT) 10 MG tablet Take 10 mg by mouth at bedtime.  06/28/16   [provider]  feeding supplement, ENSURE COMPLETE, (ENSURE COMPLETE) LIQD Take 237 mLs by mouth 2 (two) times daily between meals. 01/19/21   Roxan Hockey, MD  gabapentin (NEURONTIN) 100 MG capsule Take 100 mg by mouth at bedtime. 11/06/19   [provider]  LANTUS 100 UNIT/ML injection Inject 0.06 mLs (6 Units total) into the skin at bedtime as needed (High blood glucose). If Blood glucose over 200 01/19/21   Emokpae, Courage, MD  metoprolol succinate (TOPROL-XL) 50 MG 24 hr tablet Take 1 tablet (50 mg total)  by mouth daily. Take with or immediately following a meal. 11/29/20 02/27/21  Strader, Fransisco Hertz, PA-C  mirtazapine (REMERON) 15 MG tablet Take 0.5 tablets (7.5 mg total) by mouth at bedtime. 01/19/21   Roxan Hockey, MD  Multiple Vitamins-Minerals (MULTIVITAMIN WITH MINERALS) tablet Take 1 tablet by mouth daily. 01/19/21 01/19/22  Roxan Hockey, MD  ondansetron (ZOFRAN) 4 MG tablet Take 1 tablet (4 mg total) by mouth every 6 (six) hours as needed for nausea. 01/19/21   Roxan Hockey, MD  risperiDONE (RISPERDAL) 0.5 MG tablet Take 1 tablet (0.5 mg total) by mouth every 12 (twelve) hours as needed (Restlessness and agitation). 01/19/21   Roxan Hockey, MD  torsemide (DEMADEX) 20 MG tablet Take 1 tablet (20 mg total) by mouth daily. 01/19/21   Roxan Hockey, MD  traMADol (ULTRAM) 50 MG tablet Take 50 mg by mouth every 6 (six) hours as needed for moderate pain. 07/18/20   [provider]  Vitamin D, Ergocalciferol, (DRISDOL) 1.25 MG (50000 UNIT) CAPS capsule Take 50,000 Units by mouth every Friday.    [provider]      Allergies    Bee venom, Codeine, Propoxyphene, and Valsartan    Review of Systems   Review of Systems  Constitutional:  Negative for chills, fatigue and fever.  Respiratory:  Positive for shortness of breath. Negative for cough and wheezing.   Cardiovascular:  Negative for chest pain and leg swelling.  Gastrointestinal:  Negative for abdominal pain, blood in stool, nausea and vomiting.  Genitourinary:  Negative for dysuria and flank pain.  Musculoskeletal:  Negative for arthralgias, back pain and myalgias.  Skin:  Negative for rash.  Neurological:  Negative for dizziness, syncope, weakness, numbness and headaches.  Hematological:  Does not bruise/bleed easily.  All other systems reviewed and are negative.  Physical Exam Updated Vital Signs BP (!) 160/124    Pulse 96    Resp (!) 28    Ht 6' (1.829 m)    Wt 54.6 kg    SpO2 100%    BMI 16.32 kg/m  Physical Exam Vitals and nursing note reviewed.  Constitutional:      Appearance: He is not ill-appearing or toxic-appearing.  Eyes:     Extraocular Movements: Extraocular movements intact.     Conjunctiva/sclera: Conjunctivae normal.     Pupils: Pupils are equal, round, and reactive to light.  Cardiovascular:     Rate and Rhythm: Normal rate and regular rhythm.     Pulses: Normal pulses.  Pulmonary:     Effort: Respiratory distress present.     Breath sounds: Rales  present.     Comments: Crackles heard throughout most lung fields.  Increased work of breathing while on nonrebreather. Abdominal:     General: There is no distension.     Palpations: Abdomen is soft.     Tenderness: There is no abdominal tenderness.  Musculoskeletal:     Right lower leg: No edema.     Comments: Previous left AKA, no peripheral edema noted of the right lower extremity.  Skin:    General: Skin is warm.     Capillary Refill: Capillary refill takes less than 2 seconds.     Findings: No erythema or rash.  Neurological:     General: No focal deficit present.     Mental Status: He is alert.     Sensory: No sensory deficit.     Motor: No weakness.    ED Results / Procedures / Treatments   Labs (  all labs ordered are listed, but only abnormal results are displayed) Labs Reviewed  BASIC METABOLIC PANEL - Abnormal; Notable for the following components:      Result Value   Glucose, Bld 413 (*)    BUN 34 (*)    Creatinine, Ser 1.60 (*)    Calcium 8.7 (*)    GFR, Estimated 45 (*)    All other components within normal limits  MAGNESIUM - Abnormal; Notable for the following components:   Magnesium 1.6 (*)    All other components within normal limits  BRAIN NATRIURETIC PEPTIDE - Abnormal; Notable for the following components:   B Natriuretic Peptide 1,512.0 (*)    All other components within normal limits  CBC - Abnormal; Notable for the following components:   WBC 10.8 (*)    RBC 2.71 (*)    Hemoglobin 8.8 (*)    HCT 27.3 (*)    MCV 100.7 (*)    RDW 16.3 (*)    All other components within normal limits  RESP PANEL BY RT-PCR (FLU A&B, COVID) ARPGX2  LACTIC ACID, PLASMA  BETA-HYDROXYBUTYRIC ACID  LACTIC ACID, PLASMA  BLOOD GAS, VENOUS    EKG EKG Interpretation  Date/Time:  Monday January 23 2021 09:52:31 EST Ventricular Rate:  86 PR Interval:  162 QRS Duration: 80 QT Interval:  357 QTC Calculation: 427 R Axis:   114 Text Interpretation: Sinus rhythm  Atrial premature complexes in couplets Right axis deviation Anteroseptal infarct, old Confirmed by Campbell Stall ([id number]) on 06/23/8313 10:19:10 AM  Radiology DG Chest Portable 1 View  Result Date: 01/23/2021 CLINICAL DATA:  Shortness of breath EXAM: PORTABLE CHEST 1 VIEW COMPARISON:  January 13, 2021 FINDINGS: The heart size and mediastinal contours are stable. The heart size is upper limits are normal. Diffuse increased pulmonary interstitium is identified bilaterally. Patchy consolidation of bilateral lung bases are noted slightly improved compared to prior exam. Small left pleural effusion is identified. The visualized skeletal structures are stable. IMPRESSION: 1. Increased pulmonary interstitial markings in bilateral lungs, in part chronic, but overlying interstitial edema is not excluded. 2. Patchy consolidation of bilateral lung bases slightly improved compared prior exam. Suspect pneumonia. Electronically Signed   By: Abelardo Diesel M.D.   On: 01/23/2021 09:45    Procedures Procedures    CRITICAL CARE Performed by: Jamai Dolce Total critical care time: 40 minutes Critical care time was exclusive of separately billable procedures and treating other patients. Critical care was necessary to treat or prevent imminent or life-threatening deterioration. Critical care was time spent personally by me on the following activities: development of treatment plan with patient and/or surrogate as well as nursing, discussions with consultants, evaluation of patient's response to treatment, examination of patient, obtaining history from patient or surrogate, ordering and performing treatments and interventions, ordering and review of laboratory studies, ordering and review of radiographic studies, pulse oximetry and re-evaluation of patient's condition.   Medications Ordered in ED Medications - No data to display  ED Course/ Medical Decision Making/ A&P                           Medical Decision  Making  This patient presents to the ED for concern of recurrent exacerbation of CHF.  This involves an extensive number of treatment options, and is a complaint that carries with it a high risk of complications and morbidity.  The differential diagnosis includes pneumonia, cardiac disease, PE, sepsis  Patient is critically ill.  Will need hospitalization.   Co morbidities that complicate the patient evaluation  Diabetes, CKD, paroxysmal A. fib   Additional history obtained:  Additional history obtained from spouse External records from outside source obtained and reviewed  Reviewed patient's HPI from initial visit on 01/13/2021 and reviewed all imaging and laboratory results from that visit.  Lab Tests:  I Ordered, and personally interpreted labs.  The pertinent results include: White blood cell count of 10.8, hemoglobin 8.8, baseline from 4 days ago.  Lecture lites BUN and serum creatinine also near baseline, blood sugar elevated 413 anion gap is 10.  BNP 1500 BHA unremarkable.  Viral testing negative.  Initial lactic acid reassuring.  Patient now has elevated second lactic acid at 2.6.  This is concerning for developing sepsis.  Initial VBG shows CO2 of 60.4.  Repeat VBG shows improvement of CO2 at 58.7   Imaging Studies ordered:  I ordered imaging studies including chest x-ray shows suspected pneumonia with increased pulmonary interstitial markings in the bilateral lungs possibly overlying interstitial edema I independently visualized and interpreted imaging which showed suspected pneumonia I agree with radiology interpretation   Cardiac Monitoring:  The patient was maintained on a cardiac monitor.  I personally viewed and interpreted the cardiac monitored which showed an underlying rhythm of: Sinus rhythm with PACs   Medicines ordered and prescription drug management:  I ordered medication including Lasix for likely CHF, ceftriaxone and azithromycin for suspected  pneumonia. Reevaluation of the patient after these medicines showed that the patient seems to be worsening.  Patient is becoming increasingly agitated and combative with nursing staff.  Concerned that his agitation may be secondary to hypercarbia.  Will repeat venous blood gas.  Low-dose IV Ativan ordered.  Will also add additional broad-spectrum antibiotics I have reviewed the patients home medicines and have made adjustments as needed   Test Considered:  Chest x-ray, lactic acid, BNP EKG   Critical Interventions:  Patient on nonrebreather upon arrival with reported sats in the 70%'s.  Patient placed on BiPAP with improvement of oxygen saturation. Patient also started on broad-spectrum antibiotics for suspected pneumonia   Consultations Obtained:  I requested consultation with the [facility], Dr. Carles Collet,  and discussed lab and imaging findings as well as pertinent plan who agrees to hospital admission   Problem List / ED Course:  Patient initially given Lasix for likely CHF.  Chest x-ray shows suspected pneumonia with interstitial edema.  Started on azithromycin and ceftriaxone. Patient increasingly becoming agitated, this may be result of hypercarbia.  We will give low-dose Ativan and repeat VBG.  Also, second lactic acid increased, will add additional more broad-spectrum antibiotics.  Patient does have a BNP that is elevated at 1500 there is concern for fluid overload will refrain from giving IV fluids at this time due to concern for volume overload.   Reevaluation:  After the interventions noted above, I reevaluated the patient and found that they have : Patient has received Ativan IV, agitation has improved vital signs remained stable at this time.  He has received IV Lasix with approximately 800 cc of urinary output     Dispostion:  After consideration of the diagnostic results and the patients response to treatment, I feel that the patent would benefit from hospital  admission.  [facility], Dr. Lucky Rathke agreeable to admission.         Final Clinical Impression(s) / ED Diagnoses Final diagnoses:  Acute respiratory failure with hypoxia ([place])    Rx / DC Orders  ED Discharge Orders     None         Kem Parkinson, PA-C 59/10/28 9022    Lianne Cure, DO 84/06/98 1639

## 2021-01-23 NOTE — ED Notes (Signed)
Pt attempting to pull off all lines and bipap. RN attempting to redirect and pt became combative trying to hit this nurse. Unable to get accurate vitals due to pt hitting and being uncooperative. Continues to attempt to pull self out of bed when bp taking. BP is likely inaccurately higher d/t this. EDP/PA aware of elevated bp. Spoke with wife on phone who is on the way back to hospital with pt's DNR paperwork

## 2021-01-23 NOTE — ED Triage Notes (Signed)
Pt arrived from home via [facility] with c/o SOB. EMS states sats in 70s on room air on arrival. Recent hospital stay for same d/t chf exacerbation. Per ems sats improved to 96% on NRB.

## 2021-01-23 NOTE — ED Notes (Signed)
Respiratory called on arrival, at bedside. EDP made aware of pt status

## 2021-01-23 NOTE — ED Notes (Addendum)
Pt repositioned and foley bag emptied. 1500 ml output noted

## 2021-01-23 NOTE — Progress Notes (Signed)
Patient transported from ED to ICU without any complications. 

## 2021-01-23 NOTE — ED Notes (Signed)
While taking rectal pt was not corruptive recta temp was reading 95.8, that was not a accurate temp. When attempting to take oral temp it was 97.3 pt would not hold still and keep his mouth closed, pt is anxious and will not hold still .

## 2021-01-23 NOTE — H&P (Signed)
History and Physical  Allen Yu RSW:[id number] DOB: May 18, 1945 DOA: 01/23/2021   PCP: [name], MD   Patient coming from: Home  Chief Complaint: sob  HPI:  Allen Yu is a 76 y.o. 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  The patient is unable to provide any significant history secondary to his cognitive impairment.  All history is obtained from review of the medical record and speaking with the patient's spouse.  Accordingly, the patient was recently mated to the hospital from 01/13/2021 to 01/19/2021 for acute respiratory failure due to CHF exacerbation.  He was discharged home with torsemide 20 mg daily.  Since discharge home, the patient has been doing fairly well.  However the patient's spouse states that he drinks at least 2 gallons of water on a daily basis.  In addition, he eats whatever he wants.  There is been no reports of headache, fever, chills, chest pain, nausea, vomiting, abdominal pain, diarrhea. The patient did have an episode of self-limited atrial fibrillation in June when he underwent his AKA.  Anticoagulation was deferred at the time due to his high fall risk and low A. fib burden.  Because of his worsening shortness of breath, EMS was activated.  Apparently the patient had oxygen saturation of 70% on nonrebreather.  He was placed on BiPAP in the ED.  His wife also notes that the patient has continues to smoke a few cigarettes a day. ED In the ED, patient was afebrile and hemodynamically stable. Chest x-ray showed increased interstitial markings with bibasilar opacities that were better than previous chest x-ray in December.  Repeat VBG showed 7.30 7/58/33/27 on 45% FiO2.  Patient was given furosemide 40 mg IV and started on vancomycin, azithromycin and  ceftriaxone.   Assessment/Plan: Acute respiratory failure with hypoxia and hypercarbia -Secondary to pulmonary edema in the setting of underlying COPD -Currently on BiPAP -Wean back to room air as tolerated for saturation greater 90%  Acute on chronic systolic CHF -09/38/1829 echo EF 25-30%, indeterminate diastolic function, severely elevated PASP, moderate MR/TR -Continue intravenous furosemide 40 mg IV twice daily -Daily weights -Accurate I's and O's  CKD stage IIIb -Baseline creatinine 1.6-1.8 -Monitor with diuresis -Plan to tolerate worsening with serum creatinine for improve euvolemia  Paroxysmal Atrial Fibrillation with RVR -no longer taking amiodarone -CHADS-VASc= 4, but not clear if he is good AC candidate long term -06/29/20 Echo--EF 60-65%, no WMA, normal PASP -continue metoprolol -no longer on apixaban  Uncontrolled diabetes mellitus type 2 with hyperglycemia -12/25/2020 hemoglobin A1c 8.8 -NovoLog sliding scale  Essential hypertension Continue metoprolol    Hyperlipidemia -Restart statin   Dementia with behavioral disturbance -Restart Aricept -Continue Risperdal  BPH -chronic indwelling foley  Hypomagnesemia -Replete      Past Medical History:  Diagnosis Date   Anemia    Arthritis    Carotid stenosis    Chronic back pain    Chronic HFrEF (heart failure with reduced ejection fraction) ([place])    a. 09/2020 Echo: EF 45%, mild LVH; b. 12/2020 Echo: EF 25-30%, glob HK, mod LVH, mildly reduced RV fxn, RVSP 65.68mmHg, mod BAE. Triv effusion. Mod MR/TR.   CKD (chronic kidney disease), stage III ([place])    Constipation    Dementia ([place])    Diabetes mellitus    Dilated cardiomyopathy ([place])    a. 09/2020 Echo:  EF 45%; b. 12/2020 Echo: EF 25-30%.   GERD (gastroesophageal reflux disease)    History of kidney stones    Hypertension    Lung nodule    PAF (paroxysmal atrial fibrillation) ([facility])    Peripheral neuropathy    Peripheral vascular disease ([place])     a. 06/2020 s/p L AKA; b. 10/2020 s/p R SFA and above-knee popliteal PTA.   PTSD (post-traumatic stress disorder)    Schizophrenia ([place])    Tuberculosis    Treated   Past Surgical History:  Procedure Laterality Date   ABDOMINAL AORTOGRAM W/LOWER EXTREMITY N/A 05/20/2020   Procedure: ABDOMINAL AORTOGRAM W/LOWER EXTREMITY;  Surgeon: Elam Dutch, MD;  Location: [facility];  Service: Cardiovascular;  Laterality: N/A;   ABDOMINAL AORTOGRAM W/LOWER EXTREMITY N/A 10/27/2020   Procedure: ABDOMINAL AORTOGRAM W/LOWER EXTREMITY;  Surgeon: Marty Heck, MD;  Location: [facility];  Service: Cardiovascular;  Laterality: N/A;   AMPUTATION Left 06/28/2020   Procedure: AMPUTATION ABOVE KNEE LEFT;  Surgeon: Rosetta Posner, MD;  Location: [facility];  Service: Vascular;  Laterality: Left;   [place], 2013   x2   BLADDER SURGERY     02   CHOLECYSTECTOMY     COLONOSCOPY     COLONOSCOPY N/A 12/07/2014   Procedure: COLONOSCOPY;  Surgeon: Daneil Dolin, MD;  [facility];  Service: Endoscopy;  Laterality: N/A;  10:30 Am   EYE SURGERY Bilateral    removed metal from eye   FEMORAL-TIBIAL BYPASS GRAFT Left 05/27/2020   Procedure: LEFT FEMORAL TO PERONEAL ARTERY BYPASS;  Surgeon: Rosetta Posner, MD;  Location: [facility];  Service: Vascular;  Laterality: Left;   HERNIA REPAIR Right    INGUINAL HERNIA REPAIR Left 11/03/2013   Procedure: HERNIA REPAIR INGUINAL ADULT;  Surgeon: Gayland Curry, MD;  Location: [facility];  Service: General;  Laterality: Left;   PERIPHERAL VASCULAR INTERVENTION  10/27/2020   Procedure: PERIPHERAL VASCULAR INTERVENTION;  Surgeon: Marty Heck, MD;  Location: [facility];  Service: Cardiovascular;;   SHOULDER SURGERY     RIGHT SHOULDER    VIDEO BRONCHOSCOPY WITH ENDOBRONCHIAL NAVIGATION N/A 07/10/2019   Procedure: VIDEO BRONCHOSCOPY WITH ENDOBRONCHIAL NAVIGATION;  Surgeon: Melrose Nakayama, MD;  Location: [facility];  Service:  Thoracic;  Laterality: N/A;   Social History:  reports that he has been smoking cigarettes. He has been smoking an average of .5 packs per day. He has never used smokeless tobacco. He reports current drug use. Drug: Marijuana. He reports that he does not drink alcohol.   Family History  Problem Relation Age of Onset   Heart disease Mother        before age 37     Allergies  Allergen Reactions   Bee Venom Anaphylaxis   Codeine Other (See Comments)    incoherent  Other reaction(s): Delirium   Propoxyphene Other (See Comments)    Dizziness, "Makes me feel drunk" Other reaction(s): Dizziness   Valsartan Other (See Comments)    incoherent Other reaction(s): Delirium     Prior to Admission medications   Medication Sig Start Date End Date Taking? Authorizing Provider  acetaminophen (TYLENOL) 500 MG tablet Take 1,000 mg by mouth every 6 (six) hours as needed for moderate pain.    [provider]  Alcohol Swabs (ALCOHOL PADS) 70 % PADS USE 1 PAD AS DIRECTED 10/12/20 10/13/21  [provider]  aspirin EC 81 MG  tablet Take 1 tablet (81 mg total) by mouth daily with breakfast. 01/19/21 01/19/22  Roxan Hockey, MD  atorvastatin (LIPITOR) 40 MG tablet Take 40 mg by mouth at bedtime.    [provider]  B-D UF III MINI PEN NEEDLES 31G X 5 MM MISC SMARTSIG:1 Each SUB-Q Daily 12/23/19   [provider]  Bacitracin-Polymyxin B (NEOSPORIN EX) Apply 1 application topically daily as needed (wound care).    [provider]  clopidogrel (PLAVIX) 75 MG tablet Take 1 tablet (75 mg total) by mouth daily. 01/19/21 01/19/22  Roxan Hockey, MD  donepezil (ARICEPT) 10 MG tablet Take 10 mg by mouth at bedtime.  06/28/16   [provider]  feeding supplement, ENSURE COMPLETE, (ENSURE COMPLETE) LIQD Take 237 mLs by mouth 2 (two) times daily between meals. 01/19/21   Roxan Hockey, MD  gabapentin (NEURONTIN) 100 MG capsule Take 100 mg by mouth at bedtime.  11/06/19   [provider]  LANTUS 100 UNIT/ML injection Inject 0.06 mLs (6 Units total) into the skin at bedtime as needed (High blood glucose). If Blood glucose over 200 01/19/21   Emokpae, Courage, MD  metoprolol succinate (TOPROL-XL) 50 MG 24 hr tablet Take 1 tablet (50 mg total) by mouth daily. Take with or immediately following a meal. 11/29/20 02/27/21  Strader, Fransisco Hertz, PA-C  mirtazapine (REMERON) 15 MG tablet Take 0.5 tablets (7.5 mg total) by mouth at bedtime. 01/19/21   Roxan Hockey, MD  Multiple Vitamins-Minerals (MULTIVITAMIN WITH MINERALS) tablet Take 1 tablet by mouth daily. 01/19/21 01/19/22  Roxan Hockey, MD  ondansetron (ZOFRAN) 4 MG tablet Take 1 tablet (4 mg total) by mouth every 6 (six) hours as needed for nausea. 01/19/21   Roxan Hockey, MD  risperiDONE (RISPERDAL) 0.5 MG tablet Take 1 tablet (0.5 mg total) by mouth every 12 (twelve) hours as needed (Restlessness and agitation). 01/19/21   Roxan Hockey, MD  torsemide (DEMADEX) 20 MG tablet Take 1 tablet (20 mg total) by mouth daily. 01/19/21   Roxan Hockey, MD  traMADol (ULTRAM) 50 MG tablet Take 50 mg by mouth every 6 (six) hours as needed for moderate pain. 07/18/20   [provider]  Vitamin D, Ergocalciferol, (DRISDOL) 1.25 MG (50000 UNIT) CAPS capsule Take 50,000 Units by mouth every Friday.    [provider]    Review of Systems:  Unobtainable secondary to patient's mental status  Physical Exam: Vitals:   01/23/21 1400 01/23/21 1405 01/23/21 1415 01/23/21 1429  BP: (!) 144/95     Pulse: 83 86 81 79  Resp: (!) 28 (!) 23 15 19   Temp:      TempSrc:      SpO2: 100% 100% 100% 100%  Weight:      Height:       General:  Alert NAD, nontoxic, pleasant/cooperative Head/Eye: No conjunctival hemorrhage, no icterus, [place]/AT, No nystagmus ENT:  No icterus,  No thrush, good dentition, no pharyngeal exudate Neck:  No masses, no lymphadenpathy, no bruits CV:  RRR, no rub, no  gallop, no S3 Lung:  bilateral crackles. No wheeze Abdomen: soft/NT, +BS, nondistended, no peritoneal signs Ext: No cyanosis, No rashes, No petechiae, No lymphangitis, No edema;  L-AKA Neuro: CNII-XII intact, strength 4/5 in bilateral upper and lower extremities, no dysmetria  Labs on Admission:  Basic Metabolic Panel: Recent Labs  Lab 01/17/21 0409 01/18/21 0432 01/18/21 2316 01/19/21 0531 01/23/21 0944  NA 136 139  --  139 136  K 3.6 3.7  --  3.9 4.9  CL 99 102  --  101 98  CO2 27 30  --  27 28  GLUCOSE 229* 222* 396* 266* 413*  BUN 44* 43*  --  46* 34*  CREATININE 1.82* 1.79*  --  1.96* 1.60*  CALCIUM 8.3* 8.2*  --  8.4* 8.7*  MG  --   --   --   --  1.6*  PHOS  --  3.3  --   --   --    Liver Function Tests: Recent Labs  Lab 01/17/21 0409 01/18/21 0432  AST 60*  --   ALT 79*  --   ALKPHOS 174*  --   BILITOT 1.2  --   PROT 6.0*  --   ALBUMIN 3.0* 2.8*   No results for input(s): LIPASE, AMYLASE in the last 168 hours. No results for input(s): AMMONIA in the last 168 hours. CBC: Recent Labs  Lab 01/19/21 0531 01/23/21 0944  WBC 7.9 10.8*  HGB 8.8* 8.8*  HCT 27.1* 27.3*  MCV 97.1 100.7*  PLT 336 316   Coagulation Profile: No results for input(s): INR, PROTIME in the last 168 hours. Cardiac Enzymes: No results for input(s): CKTOTAL, CKMB, CKMBINDEX, TROPONINI in the last 168 hours. BNP: Invalid input(s): POCBNP CBG: Recent Labs  Lab 01/18/21 1629 01/18/21 2233 01/19/21 0732 01/19/21 1057 01/19/21 1608  GLUCAP 323* 403* 266* 259* 149*   Urine analysis:    Component Value Date/Time   COLORURINE YELLOW 10/17/2020 1140   APPEARANCEUR HAZY (A) 10/17/2020 1140   LABSPEC 1.014 10/17/2020 1140   PHURINE 7.0 10/17/2020 1140   GLUCOSEU >=500 (A) 10/17/2020 1140   HGBUR MODERATE (A) 10/17/2020 1140   BILIRUBINUR NEGATIVE 10/17/2020 1140   KETONESUR NEGATIVE 10/17/2020 1140   PROTEINUR >=300 (A) 10/17/2020 1140   UROBILINOGEN 0.2 02/18/2014 0950    NITRITE NEGATIVE 10/17/2020 1140   LEUKOCYTESUR MODERATE (A) 10/17/2020 1140   Sepsis Labs: @LABRCNTIP (procalcitonin:4,lacticidven:4) ) Recent Results (from the past 240 hour(s))  Resp Panel by RT-PCR (Flu A&B, Covid) Nasopharyngeal Swab     Status: None   Collection Time: 01/13/21  7:15 PM   Specimen: Nasopharyngeal Swab; Nasopharyngeal(NP) swabs in vial transport medium  Result Value Ref Range Status   SARS Coronavirus 2 by RT PCR NEGATIVE NEGATIVE Final    Comment: (NOTE) SARS-CoV-2 target nucleic acids are NOT DETECTED.  The SARS-CoV-2 RNA is generally detectable in upper respiratory specimens during the acute phase of infection. The lowest concentration of SARS-CoV-2 viral copies this assay can detect is 138 copies/mL. A negative result does not preclude SARS-Cov-2 infection and should not be used as the sole basis for treatment or other patient management decisions. A negative result may occur with  improper specimen collection/handling, submission of specimen other than nasopharyngeal swab, presence of viral mutation(s) within the areas targeted by this assay, and inadequate number of viral copies(<138 copies/mL). A negative result must be combined with clinical observations, patient history, and epidemiological information. The expected result is Negative.  Fact Sheet for Patients:  EntrepreneurPulse.com.au  Fact Sheet for Healthcare Providers:  IncredibleEmployment.be  This test is no t yet approved or cleared by the [place] FDA and  has been authorized for detection and/or diagnosis of SARS-CoV-2 by FDA under an Emergency Use Authorization (EUA). This EUA will remain  in effect (meaning this test can be used) for the duration of the COVID-19 declaration under Section 564(b)(1) of the Act, 21 U.S.C.section 360bbb-3(b)(1), unless the authorization is terminated  or revoked sooner.  Influenza A by PCR NEGATIVE NEGATIVE  Final   Influenza B by PCR NEGATIVE NEGATIVE Final    Comment: (NOTE) The Xpert Xpress SARS-CoV-2/FLU/RSV plus assay is intended as an aid in the diagnosis of influenza from Nasopharyngeal swab specimens and should not be used as a sole basis for treatment. Nasal washings and aspirates are unacceptable for Xpert Xpress SARS-CoV-2/FLU/RSV testing.  Fact Sheet for Patients: EntrepreneurPulse.com.au  Fact Sheet for Healthcare Providers: IncredibleEmployment.be  This test is not yet approved or cleared by the [place] FDA and has been authorized for detection and/or diagnosis of SARS-CoV-2 by FDA under an Emergency Use Authorization (EUA). This EUA will remain in effect (meaning this test can be used) for the duration of the COVID-19 declaration under Section 564(b)(1) of the Act, 21 U.S.C. section 360bbb-3(b)(1), unless the authorization is terminated or revoked.  Performed at [facility], [address] [place], [place] [address]   Culture, blood (routine x 2)     Status: None   Collection Time: 01/13/21  8:25 PM   Specimen: Right Antecubital; Blood  Result Value Ref Range Status   Specimen Description   Final    RIGHT ANTECUBITAL BOTTLES DRAWN AEROBIC AND ANAEROBIC   Special Requests Blood Culture adequate volume  Final   Culture   Final    NO GROWTH 5 DAYS Performed at [facility], [address], [place], [place] [address]    Report Status 01/18/2021 FINAL  Final  Culture, blood (routine x 2)     Status: None   Collection Time: 01/13/21  8:33 PM   Specimen: BLOOD RIGHT ARM  Result Value Ref Range Status   Specimen Description BLOOD RIGHT ARM  Final   Special Requests   Final    BOTTLES DRAWN AEROBIC ONLY Blood Culture results may not be optimal due to an inadequate volume of blood received in culture bottles   Culture   Final    NO GROWTH 5 DAYS Performed at [facility], [address], [place], [place] [address]    Report  Status 01/18/2021 FINAL  Final  Urine Culture     Status: Abnormal   Collection Time: 01/13/21 10:04 PM   Specimen: Urine, Catheterized  Result Value Ref Range Status   Specimen Description   Final    URINE, CATHETERIZED Performed at [facility], [address], [place], [place] [address]    Special Requests   Final    NONE Performed at [facility], [address] [place], [place] [address]    Culture (A)  Final    50,000 COLONIES/mL ENTEROBACTER CLOACAE 70,000 COLONIES/mL ENTEROCOCCUS FAECALIS    Report Status 01/16/2021 FINAL  Final   Organism ID, Bacteria ENTEROBACTER CLOACAE (A)  Final   Organism ID, Bacteria ENTEROCOCCUS FAECALIS (A)  Final      Susceptibility   Enterobacter cloacae - MIC*    CEFAZOLIN RESISTANT Resistant     CEFEPIME <=0.12 SENSITIVE Sensitive     CIPROFLOXACIN 1 RESISTANT Resistant     GENTAMICIN <=1 SENSITIVE Sensitive     IMIPENEM <=0.25 SENSITIVE Sensitive     NITROFURANTOIN 64 INTERMEDIATE Intermediate     TRIMETH/SULFA >=320 RESISTANT Resistant     PIP/TAZO <=4 SENSITIVE Sensitive     * 50,000 COLONIES/mL ENTEROBACTER CLOACAE   Enterococcus faecalis - MIC*    AMPICILLIN <=2 SENSITIVE Sensitive     NITROFURANTOIN <=16 SENSITIVE Sensitive     VANCOMYCIN 1 SENSITIVE Sensitive     * 70,000 COLONIES/mL ENTEROCOCCUS FAECALIS  MRSA  Next Gen by PCR, Nasal     Status: None   Collection Time: 01/14/21  2:24 PM   Specimen: Nasal Mucosa; Nasal Swab  Result Value Ref Range Status   MRSA by PCR Next Gen NOT DETECTED NOT DETECTED Final    Comment: (NOTE) The GeneXpert MRSA Assay (FDA approved for NASAL specimens only), is one component of a comprehensive MRSA colonization surveillance program. It is not intended to diagnose MRSA infection nor to guide or monitor treatment for MRSA infections. Test performance is not FDA approved in patients less than 42 years old. Performed at [facility], [address], [place], [place] [address]   Resp Panel by  RT-PCR (Flu A&B, Covid) Nasopharyngeal Swab     Status: None   Collection Time: 01/23/21 10:05 AM   Specimen: Nasopharyngeal Swab; Nasopharyngeal(NP) swabs in vial transport medium  Result Value Ref Range Status   SARS Coronavirus 2 by RT PCR NEGATIVE NEGATIVE Final    Comment: (NOTE) SARS-CoV-2 target nucleic acids are NOT DETECTED.  The SARS-CoV-2 RNA is generally detectable in upper respiratory specimens during the acute phase of infection. The lowest concentration of SARS-CoV-2 viral copies this assay can detect is 138 copies/mL. A negative result does not preclude SARS-Cov-2 infection and should not be used as the sole basis for treatment or other patient management decisions. A negative result may occur with  improper specimen collection/handling, submission of specimen other than nasopharyngeal swab, presence of viral mutation(s) within the areas targeted by this assay, and inadequate number of viral copies(<138 copies/mL). A negative result must be combined with clinical observations, patient history, and epidemiological information. The expected result is Negative.  Fact Sheet for Patients:  EntrepreneurPulse.com.au  Fact Sheet for Healthcare Providers:  IncredibleEmployment.be  This test is no t yet approved or cleared by the [place] FDA and  has been authorized for detection and/or diagnosis of SARS-CoV-2 by FDA under an Emergency Use Authorization (EUA). This EUA will remain  in effect (meaning this test can be used) for the duration of the COVID-19 declaration under Section 564(b)(1) of the Act, 21 U.S.C.section 360bbb-3(b)(1), unless the authorization is terminated  or revoked sooner.       Influenza A by PCR NEGATIVE NEGATIVE Final   Influenza B by PCR NEGATIVE NEGATIVE Final    Comment: (NOTE) The Xpert Xpress SARS-CoV-2/FLU/RSV plus assay is intended as an aid in the diagnosis of influenza from Nasopharyngeal swab  specimens and should not be used as a sole basis for treatment. Nasal washings and aspirates are unacceptable for Xpert Xpress SARS-CoV-2/FLU/RSV testing.  Fact Sheet for Patients: EntrepreneurPulse.com.au  Fact Sheet for Healthcare Providers: IncredibleEmployment.be  This test is not yet approved or cleared by the [place] FDA and has been authorized for detection and/or diagnosis of SARS-CoV-2 by FDA under an Emergency Use Authorization (EUA). This EUA will remain in effect (meaning this test can be used) for the duration of the COVID-19 declaration under Section 564(b)(1) of the Act, 21 U.S.C. section 360bbb-3(b)(1), unless the authorization is terminated or revoked.  Performed at [facility], [address], [place], [place] [address]      Radiological Exams on Admission: DG Chest Portable 1 View  Result Date: 01/23/2021 CLINICAL DATA:  Shortness of breath EXAM: PORTABLE CHEST 1 VIEW COMPARISON:  January 13, 2021 FINDINGS: The heart size and mediastinal contours are stable. The heart size is upper limits are normal. Diffuse increased pulmonary interstitium is identified bilaterally. Patchy consolidation of bilateral lung bases are noted  slightly improved compared to prior exam. Small left pleural effusion is identified. The visualized skeletal structures are stable. IMPRESSION: 1. Increased pulmonary interstitial markings in bilateral lungs, in part chronic, but overlying interstitial edema is not excluded. 2. Patchy consolidation of bilateral lung bases slightly improved compared prior exam. Suspect pneumonia. Electronically Signed   By: Abelardo Diesel M.D.   On: 01/23/2021 09:45    EKG: Independently reviewed. Sinus, no STT change    Time spent:60 minutes Code Status:   DNR--confirmed with spouse Family Communication:  spouse at bedside 1/2 Disposition Plan: expect 2 day hospitalization Consults called: none DVT Prophylaxis: Craig  Lovenox  Orson Eva, DO  [facility] Pager [phone]  If 7PM-7AM, please contact night-coverage [email or web address] Password [facility] 01/23/2021, 3:03 PM

## 2021-01-23 NOTE — ED Notes (Signed)
Pt noted with bm, cleaned pt and provided pericare and catheter care. Pt combative during care. Mild breakdown noted at coccyx with superficial skin loss noted.

## 2021-01-24 DIAGNOSIS — S78112A Complete traumatic amputation at level between left hip and knee, initial encounter: Secondary | ICD-10-CM

## 2021-01-24 DIAGNOSIS — N1831 Chronic kidney disease, stage 3a: Secondary | ICD-10-CM

## 2021-01-24 LAB — BASIC METABOLIC PANEL
Anion gap: 10 (ref 5–15)
BUN: 35 mg/dL — ABNORMAL HIGH (ref 8–23)
CO2: 30 mmol/L (ref 22–32)
Calcium: 9.5 mg/dL (ref 8.9–10.3)
Chloride: 101 mmol/L (ref 98–111)
Creatinine, Ser: 1.32 mg/dL — ABNORMAL HIGH (ref 0.61–1.24)
GFR, Estimated: 56 mL/min — ABNORMAL LOW (ref >60)
Glucose, Bld: 137 mg/dL — ABNORMAL HIGH (ref 70–99)
Potassium: 4.4 mmol/L (ref 3.5–5.1)
Sodium: 141 mmol/L (ref 135–145)

## 2021-01-24 LAB — GLUCOSE, CAPILLARY
Glucose-Capillary: 276 mg/dL — ABNORMAL HIGH (ref 70–99)
Glucose-Capillary: 263 mg/dL — ABNORMAL HIGH (ref 70–99)
Glucose-Capillary: 198 mg/dL — ABNORMAL HIGH (ref 70–99)
Glucose-Capillary: 251 mg/dL — ABNORMAL HIGH (ref 70–99)

## 2021-01-24 LAB — MAGNESIUM: Magnesium: 1.7 mg/dL (ref 1.7–2.4)

## 2021-01-24 MED ORDER — FUROSEMIDE 10 MG/ML IJ SOLN
40.0000 mg | Freq: Once | INTRAMUSCULAR | Status: AC
  Administered 2021-01-24: 40 mg via INTRAVENOUS
  Filled 2021-01-24: qty 4

## 2021-01-24 MED ORDER — CHLORHEXIDINE GLUCONATE CLOTH 2 % EX PADS
6.0000 | MEDICATED_PAD | Freq: Every day | CUTANEOUS | Status: DC
  Administered 2021-01-24: 6 via TOPICAL

## 2021-01-24 MED ORDER — HALOPERIDOL LACTATE 5 MG/ML IJ SOLN
5.0000 mg | Freq: Four times a day (QID) | INTRAMUSCULAR | Status: DC | PRN
  Administered 2021-01-24 – 2021-01-25 (×2): 5 mg via INTRAVENOUS
  Filled 2021-01-24 (×2): qty 1

## 2021-01-24 MED ORDER — LORAZEPAM 2 MG/ML IJ SOLN
1.0000 mg | INTRAMUSCULAR | Status: DC | PRN
  Administered 2021-01-24 (×2): 1 mg via INTRAVENOUS
  Filled 2021-01-24 (×2): qty 1

## 2021-01-24 NOTE — Progress Notes (Signed)
Patient is currently on 2L [place] with O2 sat of 100% and resting peacefully.  Bipap on standby.

## 2021-01-24 NOTE — Progress Notes (Signed)
Inpatient Diabetes Program Recommendations  AACE/ADA: New Consensus Statement on Inpatient Glycemic Control   Target Ranges:  Prepandial:   less than 140 mg/dL      Peak postprandial:   less than 180 mg/dL (1-2 hours)      Critically ill patients:  140 - 180 mg/dL    Latest Reference Range & Units 01/24/21 07:23  Glucose-Capillary 70 - 99 mg/dL 276 (H)    Latest Reference Range & Units 01/23/21 09:44  Glucose 70 - 99 mg/dL 413 (H)   Review of Glycemic Control  Diabetes history: DM2 Outpatient Diabetes medications: Lantus 6  units QHS if CBGs over 200 mg/dl Current orders for Inpatient glycemic control: Novolog 0-9 units TID with meals, Novolog 0-5 units QHS  Inpatient Diabetes Program Recommendations:    Insulin: If glucose is consistently over 180 mg/dl with Novolog correction, please consider ordering Lantus 5 units Q24H.  Thanks, Barnie Alderman, RN, MSN, CDE Diabetes Coordinator Inpatient Diabetes Program [phone] (Team Pager from 8am to 5pm)

## 2021-01-24 NOTE — Progress Notes (Signed)
PROGRESS NOTE  Allen Yu GQQ:[id number] DOB: [birth year] DOA: 01/23/2021 PCP: [name], MD  Brief History:  76 y.o. 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  The patient is unable to provide any significant history secondary to his cognitive impairment.  All history is obtained from review of the medical record and speaking with the patient's spouse.  Accordingly, the patient was recently mated to the hospital from 01/13/2021 to 01/19/2021 for acute respiratory failure due to CHF exacerbation.  He was discharged home with torsemide 20 mg daily.  Since discharge home, the patient has been doing fairly well.  However the patient's spouse states that he drinks at least 2 gallons of water on a daily basis.  In addition, he eats whatever he wants.  There is been no reports of headache, fever, chills, chest pain, nausea, vomiting, abdominal pain, diarrhea. The patient did have an episode of self-limited atrial fibrillation in June when he underwent his AKA.  Anticoagulation was deferred at the time due to his high fall risk and low A. fib burden.  Because of his worsening shortness of breath, EMS was activated.  Apparently the patient had oxygen saturation of 70% on nonrebreather.  He was placed on BiPAP in the ED.  His wife also notes that the patient has continues to smoke a few cigarettes a day. ED In the ED, patient was afebrile and hemodynamically stable. Chest x-ray showed increased interstitial markings with bibasilar opacities that were better than previous chest x-ray in December.  Repeat VBG showed 7.30 7/58/33/27 on 45% FiO2.  Patient was given furosemide 40 mg IV and started on vancomycin, azithromycin and ceftriaxone.  Antibiotics discontinued when PCT noted to be <0.10  Assessment/Plan: Acute  respiratory failure with hypoxia and hypercarbia -Secondary to pulmonary edema in the setting of underlying COPD -Currently on BiPAP>>1L -Wean back to room air as tolerated for saturation greater 90% -PCT <0.10   Acute on chronic systolic CHF -67/12/4578 echo EF 25-30%, indeterminate diastolic function, severely elevated PASP, moderate MR/TR -had lasix 40 IV 1/2 -redose lasix IV 40 mg on 1/3 -Daily weights -Accurate I's and O's   CKD stage IIIa -Baseline creatinine 1.3-1.6 -Monitor with diuresis -Plan to tolerate worsening with serum creatinine for improve euvolemia   Paroxysmal Atrial Fibrillation with RVR -no longer taking amiodarone -CHADS-VASc= 4 -06/29/20 Echo--EF 60-65%, no WMA, normal PASP -continue metoprolol -no longer on apixaban due to falls   Uncontrolled diabetes mellitus type 2 with hyperglycemia -12/25/2020 hemoglobin A1c 8.8 -NovoLog sliding scale   Essential hypertension Continue metoprolol    Hyperlipidemia -Restart statin   Dementia with behavioral disturbance -Restart Aricept -Holding Risperdal (getting haldol IV prn presently)   BPH -chronic indwelling foley   Hypomagnesemia -Replete          Family Communication:   spouse updated at bedside 01/24/21  Consultants:    Code Status:  FULL / DNR  DVT Prophylaxis:  [place] Heparin / [place] Lovenox   Procedures: As Listed in Progress Note Above  Antibiotics: None      Subjective: Pt is intermittently agitated.  Denies cp, sob.  Remainder unobtainable due to dementia  Objective: Vitals:   01/24/21 1000 01/24/21 1100 01/24/21 1200 01/24/21 1250  BP:   (!) 146/83   Pulse:      Resp: 14 (!) 24  11 17  Temp:   (!) 96.5 F (35.8 C)   TempSrc:   Axillary   SpO2:    99%  Weight:      Height:        Intake/Output Summary (Last 24 hours) at 01/24/2021 1740 Last data filed at 01/24/2021 1700 Gross per 24 hour  Intake 243 ml  Output 625 ml  Net -382 ml   Weight change:   Exam:  General:  Pt is alert, follows commands appropriately, not in acute distress HEENT: No icterus, No thrush, No neck mass, [place]/AT Cardiovascular: RRR, S1/S2, no rubs, no gallops Respiratory: bibasilar crackles. No wheeze Abdomen: Soft/+BS, non tender, non distended, no guarding Extremities: No edema, No lymphangitis, No petechiae, No rashes, no synovitis   Data Reviewed: I have personally reviewed following labs and imaging studies Basic Metabolic Panel: Recent Labs  Lab 01/18/21 0432 01/18/21 2316 01/19/21 0531 01/23/21 0944 01/24/21 0412  NA 139  --  139 136 141  K 3.7  --  3.9 4.9 4.4  CL 102  --  101 98 101  CO2 30  --  27 28 30   GLUCOSE 222* 396* 266* 413* 137*  BUN 43*  --  46* 34* 35*  CREATININE 1.79*  --  1.96* 1.60* 1.32*  CALCIUM 8.2*  --  8.4* 8.7* 9.5  MG  --   --   --  1.6* 1.7  PHOS 3.3  --   --   --   --    Liver Function Tests: Recent Labs  Lab 01/18/21 0432  ALBUMIN 2.8*   No results for input(s): LIPASE, AMYLASE in the last 168 hours. No results for input(s): AMMONIA in the last 168 hours. Coagulation Profile: No results for input(s): INR, PROTIME in the last 168 hours. CBC: Recent Labs  Lab 01/19/21 0531 01/23/21 0944  WBC 7.9 10.8*  HGB 8.8* 8.8*  HCT 27.1* 27.3*  MCV 97.1 100.7*  PLT 336 316   Cardiac Enzymes: No results for input(s): CKTOTAL, CKMB, CKMBINDEX, TROPONINI in the last 168 hours. BNP: Invalid input(s): POCBNP CBG: Recent Labs  Lab 01/19/21 1057 01/19/21 1608 01/24/21 0723 01/24/21 1148 01/24/21 1625  GLUCAP 259* 149* 276* 263* 198*   HbA1C: No results for input(s): HGBA1C in the last 72 hours. Urine analysis:    Component Value Date/Time   COLORURINE YELLOW 10/17/2020 1140   APPEARANCEUR HAZY (A) 10/17/2020 1140   LABSPEC 1.014 10/17/2020 1140   PHURINE 7.0 10/17/2020 1140   GLUCOSEU >=500 (A) 10/17/2020 1140   HGBUR MODERATE (A) 10/17/2020 1140   BILIRUBINUR NEGATIVE 10/17/2020 1140   KETONESUR  NEGATIVE 10/17/2020 1140   PROTEINUR >=300 (A) 10/17/2020 1140   UROBILINOGEN 0.2 02/18/2014 0950   NITRITE NEGATIVE 10/17/2020 1140   LEUKOCYTESUR MODERATE (A) 10/17/2020 1140   Sepsis Labs: @LABRCNTIP (procalcitonin:4,lacticidven:4) ) Recent Results (from the past 240 hour(s))  Resp Panel by RT-PCR (Flu A&B, Covid) Nasopharyngeal Swab     Status: None   Collection Time: 01/23/21 10:05 AM   Specimen: Nasopharyngeal Swab; Nasopharyngeal(NP) swabs in vial transport medium  Result Value Ref Range Status   SARS Coronavirus 2 by RT PCR NEGATIVE NEGATIVE Final    Comment: (NOTE) SARS-CoV-2 target nucleic acids are NOT DETECTED.  The SARS-CoV-2 RNA is generally detectable in upper respiratory specimens during the acute phase of infection. The lowest concentration of SARS-CoV-2 viral copies this assay can detect is 138 copies/mL. A negative result does not preclude SARS-Cov-2 infection and should not be used as the  sole basis for treatment or other patient management decisions. A negative result may occur with  improper specimen collection/handling, submission of specimen other than nasopharyngeal swab, presence of viral mutation(s) within the areas targeted by this assay, and inadequate number of viral copies(<138 copies/mL). A negative result must be combined with clinical observations, patient history, and epidemiological information. The expected result is Negative.  Fact Sheet for Patients:  EntrepreneurPulse.com.au  Fact Sheet for Healthcare Providers:  IncredibleEmployment.be  This test is no t yet approved or cleared by the [place] FDA and  has been authorized for detection and/or diagnosis of SARS-CoV-2 by FDA under an Emergency Use Authorization (EUA). This EUA will remain  in effect (meaning this test can be used) for the duration of the COVID-19 declaration under Section 564(b)(1) of the Act, 21 U.S.C.section 360bbb-3(b)(1),  unless the authorization is terminated  or revoked sooner.       Influenza A by PCR NEGATIVE NEGATIVE Final   Influenza B by PCR NEGATIVE NEGATIVE Final    Comment: (NOTE) The Xpert Xpress SARS-CoV-2/FLU/RSV plus assay is intended as an aid in the diagnosis of influenza from Nasopharyngeal swab specimens and should not be used as a sole basis for treatment. Nasal washings and aspirates are unacceptable for Xpert Xpress SARS-CoV-2/FLU/RSV testing.  Fact Sheet for Patients: EntrepreneurPulse.com.au  Fact Sheet for Healthcare Providers: IncredibleEmployment.be  This test is not yet approved or cleared by the [place] FDA and has been authorized for detection and/or diagnosis of SARS-CoV-2 by FDA under an Emergency Use Authorization (EUA). This EUA will remain in effect (meaning this test can be used) for the duration of the COVID-19 declaration under Section 564(b)(1) of the Act, 21 U.S.C. section 360bbb-3(b)(1), unless the authorization is terminated or revoked.  Performed at [facility], [address] [place], [place] [address]      Scheduled Meds:  aspirin EC  81 mg Oral Q breakfast   atorvastatin  40 mg Oral QHS   Chlorhexidine Gluconate Cloth  6 each Topical Daily   clopidogrel  75 mg Oral Daily   enoxaparin (LOVENOX) injection  40 mg Subcutaneous Q24H   gabapentin  100 mg Oral QHS   insulin aspart  0-5 Units Subcutaneous QHS   insulin aspart  0-9 Units Subcutaneous TID WC   metoprolol succinate  50 mg Oral Daily   mirtazapine  7.5 mg Oral QHS   sodium chloride flush  3 mL Intravenous Q12H   Continuous Infusions:  sodium chloride      Procedures/Studies: US Venous Img Upper Uni Left (DVT)  Result Date: 01/18/2021 CLINICAL DATA:  LEFT upper extremity edema. EXAM: LEFT UPPER EXTREMITY VENOUS DOPPLER ULTRASOUND TECHNIQUE: Gray-scale sonography with graded compression, as well as color Doppler and duplex ultrasound were  performed to evaluate the upper extremity deep venous system from the level of the subclavian vein and including the jugular, axillary, basilic, radial, ulnar and upper cephalic vein. Spectral Doppler was utilized to evaluate flow at rest and with distal augmentation maneuvers. COMPARISON:  Chest XR, 01/13/2021.  CTA chest, 06/29/2020. FINDINGS: VENOUS Normal compressibility of the LEFT internal jugular, subclavian, axillary, cephalic, basilic, brachial, radial and ulnar veins. No filling defects to suggest DVT on grayscale or color Doppler imaging. Doppler waveforms show normal direction of venous flow, normal respiratory plasticity and response to augmentation. Limited views of the contralateral subclavian vein are unremarkable. OTHER Incidental, LEFT carotid atherosclerosis.  Incompletely assessed. Subcutaneous edema, within the imaged LEFT upper extremity. Incompletely imaged. Limitations: none IMPRESSION: No  evidence of DVT within the LEFT upper extremity. Michaelle Birks, MD [facility] Electronically Signed   By: Michaelle Birks M.D.   On: 01/18/2021 12:04   DG Chest Portable 1 View  Result Date: 01/23/2021 CLINICAL DATA:  Shortness of breath EXAM: PORTABLE CHEST 1 VIEW COMPARISON:  January 13, 2021 FINDINGS: The heart size and mediastinal contours are stable. The heart size is upper limits are normal. Diffuse increased pulmonary interstitium is identified bilaterally. Patchy consolidation of bilateral lung bases are noted slightly improved compared to prior exam. Small left pleural effusion is identified. The visualized skeletal structures are stable. IMPRESSION: 1. Increased pulmonary interstitial markings in bilateral lungs, in part chronic, but overlying interstitial edema is not excluded. 2. Patchy consolidation of bilateral lung bases slightly improved compared prior exam. Suspect pneumonia. Electronically Signed   By: Abelardo Diesel M.D.   On:  01/23/2021 09:45   DG Chest Portable 1 View  Result Date: 01/13/2021 CLINICAL DATA:  Shortness of breath. EXAM: PORTABLE CHEST 1 VIEW COMPARISON:  September 27, 2020 FINDINGS: Stable moderate to marked severity diffuse chronic appearing increased interstitial lung markings are seen. Mild bilateral lower lobe infiltrates are seen, left greater than right. There is a small left pleural effusion. No pneumothorax is identified. The cardiac silhouette is enlarged and unchanged in size. There is moderate severity calcification of the aortic arch. A radiopaque fusion plate and screws are seen overlying the cervical spine. IMPRESSION: 1. Stable moderate to marked severity diffuse chronic appearing increased interstitial lung markings with mild bilateral lower lobe infiltrates, left greater than right. 2. Small left pleural effusion. Electronically Signed   By: Virgina Norfolk M.D.   On: 01/13/2021 19:10   ECHOCARDIOGRAM COMPLETE  Result Date: 01/14/2021    ECHOCARDIOGRAM REPORT   Patient Name:   JAK HAGGAR Date of Exam: 01/14/2021 Medical Rec #:  [id number]      Height:       73.0 in Accession #:    [id number]     Weight:       120.4 lb Date of Birth:  1945/10/17     BSA:          1.734 m Patient Age:    31 years       BP:           116/90 mmHg Patient Gender: M              HR:           77 bpm. Exam Location:  [facility] Procedure: 2D Echo, Cardiac Doppler and Color Doppler Indications:    Congestive Heart Failure I50.9  History:        Patient has prior history of Echocardiogram examinations, most                 recent 09/27/2020. CHF, COPD, Arrythmias:Atrial Fibrillation; Risk                 Factors:Diabetes and Hypertension. PTSD, dementia,                 schizophrenia.  Sonographer:    Alvino Chapel RCS Referring Phys: [id number] ASIA B [place]  Sonographer Comments: Patient confused/combative at times. IMPRESSIONS  1. Left ventricular ejection fraction, by estimation, is 25 to 30%. The left  ventricle has severely decreased function. The left ventricle demonstrates global hypokinesis with some regional variation. There is moderate left ventricular hypertrophy. Left  ventricular diastolic parameters are indeterminate.  2.  Right ventricular systolic function is mildly reduced. The right ventricular size is normal. There is severely elevated pulmonary artery systolic pressure. The estimated right ventricular systolic pressure is 03.5 mmHg.  3. Left atrial size was moderately dilated.  4. Right atrial size was moderately dilated.  5. There is a trivial pericardial effusion posterior to the left ventricle and localized near the right atrium.  6. The mitral valve is degenerative. Moderate mitral valve regurgitation.  7. Tricuspid valve regurgitation is moderate.  8. The aortic valve is tricuspid. There is mild calcification of the aortic valve. Aortic valve regurgitation is not visualized.  9. The inferior vena cava is dilated in size with <50% respiratory variability, suggesting right atrial pressure of 15 mmHg. Comparison(s): Prior images reviewed side by side. LVEF has decreased further and estimated PASP is higher. FINDINGS  Left Ventricle: Left ventricular ejection fraction, by estimation, is 25 to 30%. The left ventricle has severely decreased function. The left ventricle demonstrates global hypokinesis. The left ventricular internal cavity size was normal in size. There is moderate left ventricular hypertrophy. Left ventricular diastolic parameters are indeterminate. Right Ventricle: The right ventricular size is normal. No increase in right ventricular wall thickness. Right ventricular systolic function is mildly reduced. There is severely elevated pulmonary artery systolic pressure. The tricuspid regurgitant velocity is 3.56 m/s, and with an assumed right atrial pressure of 15 mmHg, the estimated right ventricular systolic pressure is 00.9 mmHg. Left Atrium: Left atrial size was moderately dilated.  Right Atrium: Right atrial size was moderately dilated. Pericardium: Trivial pericardial effusion is present. The pericardial effusion is posterior to the left ventricle and localized near the right atrium. Mitral Valve: The mitral valve is degenerative in appearance. There is mild thickening of the mitral valve leaflet(s). Moderate mitral valve regurgitation. Tricuspid Valve: The tricuspid valve is grossly normal. Tricuspid valve regurgitation is moderate. Aortic Valve: The aortic valve is tricuspid. There is mild calcification of the aortic valve. There is mild aortic valve annular calcification. Aortic valve regurgitation is not visualized. Pulmonic Valve: The pulmonic valve was grossly normal. Pulmonic valve regurgitation is trivial. Aorta: The aortic root is normal in size and structure. Venous: The inferior vena cava is dilated in size with less than 50% respiratory variability, suggesting right atrial pressure of 15 mmHg. IAS/Shunts: No atrial level shunt detected by color flow Doppler. Additional Comments: There is pleural effusion in the left lateral region.  LEFT VENTRICLE PLAX 2D LVIDd:         5.20 cm LVIDs:         4.40 cm LV PW:         1.50 cm LV IVS:        1.30 cm LVOT diam:     1.90 cm LV SV:         27 LV SV Index:   15 LVOT Area:     2.84 cm  RIGHT VENTRICLE TAPSE (M-mode): 1.2 cm LEFT ATRIUM             Index        RIGHT ATRIUM           Index LA diam:        3.90 cm 2.25 cm/m   RA Area:     23.70 cm LA Vol (A2C):   82.1 ml 47.35 ml/m  RA Volume:   79.70 ml  45.96 ml/m LA Vol (A4C):   83.5 ml 48.15 ml/m LA Biplane Vol: 85.9 ml 49.54 ml/m  AORTIC VALVE LVOT  Vmax:   54.00 cm/s LVOT Vmean:  34.800 cm/s LVOT VTI:    0.094 m  AORTA Ao Root diam: 3.80 cm MITRAL VALVE                  TRICUSPID VALVE MV Area (PHT): 2.91 cm       TR Peak grad:   50.7 mmHg MV Decel Time: 261 msec       TR Vmax:        356.00 cm/s MR Peak grad:    71.6 mmHg MR Mean grad:    51.0 mmHg    SHUNTS MR Vmax:          423.00 cm/s  Systemic VTI:  0.09 m MR Vmean:        334.0 cm/s   Systemic Diam: 1.90 cm MR PISA:         4.02 cm MR PISA Eff ROA: 30 mm MR PISA Radius:  0.80 cm MV E velocity: 88.10 cm/s Rozann Lesches MD Electronically signed by Rozann Lesches MD Signature Date/Time: 01/14/2021/2:11:21 PM    Final     Orson Eva, DO  [facility]  If 7PM-7AM, please contact night-coverage [email or web address] Password [email or web address] 01/24/2021, 5:40 PM   LOS: 1 day

## 2021-01-25 LAB — BASIC METABOLIC PANEL
Anion gap: 8 (ref 5–15)
BUN: 37 mg/dL — ABNORMAL HIGH (ref 8–23)
CO2: 33 mmol/L — ABNORMAL HIGH (ref 22–32)
Calcium: 9.3 mg/dL (ref 8.9–10.3)
Chloride: 100 mmol/L (ref 98–111)
Creatinine, Ser: 1.39 mg/dL — ABNORMAL HIGH (ref 0.61–1.24)
GFR, Estimated: 53 mL/min — ABNORMAL LOW (ref >60)
Glucose, Bld: 303 mg/dL — ABNORMAL HIGH (ref 70–99)
Potassium: 3.8 mmol/L (ref 3.5–5.1)
Sodium: 141 mmol/L (ref 135–145)

## 2021-01-25 LAB — GLUCOSE, CAPILLARY
Glucose-Capillary: 266 mg/dL — ABNORMAL HIGH (ref 70–99)
Glucose-Capillary: 289 mg/dL — ABNORMAL HIGH (ref 70–99)
Glucose-Capillary: 196 mg/dL — ABNORMAL HIGH (ref 70–99)

## 2021-01-25 MED ORDER — LIVING BETTER WITH HEART FAILURE BOOK: Freq: Once | Status: AC

## 2021-01-25 MED ORDER — TORSEMIDE 20 MG PO TABS: 30.0000 mg | ORAL_TABLET | Freq: Every day | ORAL | 3 refills | Status: DC

## 2021-01-25 MED ORDER — LOSARTAN POTASSIUM 50 MG PO TABS: 25.0000 mg | ORAL_TABLET | Freq: Every day | ORAL | Status: DC

## 2021-01-25 NOTE — TOC Initial Note (Signed)
Transition of Care [facility]) - Initial/Assessment Note    Patient Details  Name: Allen Yu MRN: [id number] Date of Birth: 03-19-1945  Transition of Care [facility]) CM/SW Contact:    Salome Arnt, [place] Phone Number: 01/25/2021, 9:30 AM  Clinical Narrative:  Pt admitted due to acute respiratory failure with hypoxia and hypercarbia. TOC received consult for CHF screening. Pt at high risk for readmission. He lives with his wife and 2 teenage grandchildren. Assessment completed with pt's wife as pt disoriented x4 per chart. Pt's wife reports pt has a caregiver for 36 hours a week and can get additional hours if needed through the [place]. She states pt is scheduled to start outpatient PT this week. Pt had left [facility] in June of this year. Pt's wife indicates she has significant support in the home, including a whole in home health team through the [place]. Pt will return home when medically stable. No needs reported at this time. CHF consult completed. Pt's wife states she has not weighed pt much since amputation. She feels they follow a heart healthy diet pretty well. Pt takes medications as prescribed. CHF book ordered for pt. TOC will continue to follow.                  Expected Discharge Plan: Home/Self Care Barriers to Discharge: Continued Medical Work up   Patient Goals and CMS Choice Patient states their goals for this hospitalization and ongoing recovery are:: return home   Choice offered to / list presented to : Spouse  Expected Discharge Plan and Services Expected Discharge Plan: Home/Self Care In-house Referral: Clinical Social Work   Post Acute Care Choice: Resumption of Svcs/PTA Provider Living arrangements for the past 2 months: Single Family Home                                      Prior Living Arrangements/Services Living arrangements for the past 2 months: Single Family Home Lives with:: Spouse Patient language and need for interpreter reviewed:: Yes Do you feel  safe going back to the place where you live?: Yes      Need for Family Participation in Patient Care: Yes (Comment) Care giver support system in place?: Yes (comment) Current home services: DME, Other (comment) (roll in shower, lift chair, platform elevator. [facility].) Criminal Activity/Legal Involvement Pertinent to Current Situation/Hospitalization: No - Comment as needed  Activities of Daily Living Home Assistive Devices/Equipment: Cane (specify quad or straight), Walker (specify type), Wheelchair, Bedside commode/3-in-1, CBG Meter, Hearing aid ADL Screening (condition at time of admission) Patient's cognitive ability adequate to safely complete daily activities?: No Is the patient deaf or have difficulty hearing?: Yes Does the patient have difficulty seeing, even when wearing glasses/contacts?: No Does the patient have difficulty concentrating, remembering, or making decisions?: Yes Patient able to express need for assistance with ADLs?: Yes Does the patient have difficulty dressing or bathing?: Yes Independently performs ADLs?: No Communication: Independent Dressing (OT): Needs assistance Is this a change from baseline?: Pre-admission baseline Grooming: Needs assistance Is this a change from baseline?: Pre-admission baseline Feeding: Needs assistance Is this a change from baseline?: Pre-admission baseline Bathing: Needs assistance Is this a change from baseline?: Pre-admission baseline Toileting: Needs assistance Is this a change from baseline?: Pre-admission baseline In/Out Bed: Needs assistance Is this a change from baseline?: Pre-admission baseline Walks in Home: Dependent Is this a change from baseline?:  Pre-admission baseline Does the patient have difficulty walking or climbing stairs?: Yes Weakness of Legs: Right Weakness of Arms/Hands: Both  Permission Sought/Granted                  Emotional Assessment     Affect (typically observed): Unable  to Assess   Alcohol / Substance Use: Not Applicable Psych Involvement: No (comment)  Admission diagnosis:  Acute respiratory failure with hypoxia (HCC) [J96.01] Acute respiratory failure with hypoxia and hypercarbia (HCC) [J96.01, J96.02] Patient Active Problem List   Diagnosis Date Noted   Acute respiratory failure with hypoxia and hypercarbia (HCC) 01/23/2021   CKD (chronic kidney disease) stage 3, GFR 30-59 ml/min (HCC) 01/23/2021   Acute on chronic systolic CHF (congestive heart failure) ([place]) 01/23/2021   Acute respiratory failure with hypoxia ([place]) 01/13/2021   Encounter for fitting and adjustment of hearing aid 12/21/2020   Other specified problems related to psychosocial circumstances 11/21/2020   Sensorineural hearing loss, bilateral 11/21/2020   Tobacco dependence 10/06/2020   Benign prostatic hyperplasia with urinary obstruction 10/06/2020   Above knee amputation of left lower extremity ([place]) 10/06/2020   Other specified counseling 10/06/2020   Severe sepsis ([place]) 09/24/2020   Schizophrenia, unspecified ([place]) 09/24/2020   Unspecified dementia, unspecified severity, without behavioral disturbance, psychotic disturbance, mood disturbance, and anxiety ([place]) 09/24/2020   Lung nodule 09/24/2020   Indwelling Foley catheter present 09/24/2020   Peripheral vascular disease ([place]) 09/24/2020   Mass of testicle 09/24/2020   Problem related to unspecified psychosocial circumstances 08/01/2020   Hypermetropia 07/22/2020   Unspecified atrial fibrillation ([employer]) 07/22/2020   Klebsiella sepsis ([place]) 07/22/2020   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   SIRS (systemic inflammatory response syndrome) ([place]) 71/06/2692   Acute metabolic encephalopathy 85/46/2703   Hyponatremia    S/P AKA (above knee amputation) unilateral, left ([place]) 06/28/2020   Gangrene of left foot ([place]) 06/28/2020   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 ([place]) 05/27/2020   Critical lower limb ischemia  ([place]) 05/21/2020   Malnutrition ([place]) 05/19/2020   Plantar callus 12/24/2019   Cavitary pneumonia 09/14/2019   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   Unintentional weight loss 09/14/2019   Coronary artery calcification seen on CT scan 10/28/2018   Hilar adenopathy 04/10/2016   Lung nodule seen on imaging study 07/09/2015   Acute encephalopathy    Hypoglycemia due to insulin    Type 2 diabetes mellitus with other circulatory complications ([place]) 50/09/3816   Acute kidney injury superimposed on chronic kidney disease ([place]) 07/08/2015   Tobacco use disorder 07/08/2015   Diverticulosis of colon without hemorrhage    Left inguinal hernia 09/16/2013   Altered mental status 11/17/2012   Hypothermia 11/17/2012   Leukocytosis 11/17/2012   Chronic kidney disease, stage 3 unspecified ([place])    Post-traumatic stress disorder, chronic    GERD (gastroesophageal reflux disease)    Essential (primary) hypertension 09/10/2011   Anemia 09/10/2011   Diabetic neuropathy ([place]) 09/10/2011   DDD (degenerative disc disease), lumbar 09/10/2011   Psychosis ([place]) 09/10/2011   Diabetes mellitus type 2, uncontrolled 09/10/2011   Hyperlipemia 09/10/2011   Microalbuminuria 09/10/2011   PCP:  [name], MD Pharmacy:   [facility] - [address], [place] [address] Phone: [phone] Fax: [place], [address] [place]  [address] Phone: [phone] Fax: [phone]  [place], [address] [place] [address] Phone: [phone] Fax: [place], [place] - [address] [place] [address] Phone: [phone] Fax: [phone]     Social Determinants of Health  (SDOH) Interventions    Readmission Risk Interventions Readmission Risk Prevention Plan 01/25/2021 01/18/2021 09/26/2020  Post Dischage Appt - - -  Medication Screening - - -  Transportation Screening Complete Complete Complete  PCP or Specialist Appt within 3-5 Days - - -  [place]RI or [place] for [place] - - -  Medication Review Press photographer) Complete Complete Complete  PCP or Specialist appointment within 3-5 days of discharge - Complete -  [place] or [place] Complete Complete Complete  SW Recovery Care/Counseling Consult Complete - Complete  Palliative Care Screening Not Applicable - Not Complete  Comments - - Referred to [facility] Palliative in July  [place] Not Applicable Patient Refused Not Complete  SNF Comments - - Refusing  Some recent data might be hidden

## 2021-01-25 NOTE — Discharge Summary (Signed)
Physician Discharge Summary  Allen Yu SEG:[id number] DOB: [birth year] DOA: 01/23/2021  PCP: [name], MD  Admit date: 01/23/2021 Discharge date: 01/25/2021  Time spent: 35 minutes  Recommendations for Outpatient Follow-up:  Repeat basic metabolic panel to follow to lites and renal function. Reassess blood pressure and adjust antihypertensive agents as needed. Close follow up with cardiology as an outpatient.  Discharge Diagnoses:  Principal Problem:   Acute respiratory failure with hypoxia and hypercarbia (HCC) Active Problems:   Unspecified atrial fibrillation (HCC)   Peripheral vascular disease (HCC)   Above knee amputation of left lower extremity (HCC)   CKD (chronic kidney disease) stage 3, GFR 30-59 ml/min (HCC)   Acute on chronic systolic CHF (congestive heart failure) ([place])   Discharge Condition: Stable and improved.  Discharged back home with instruction to follow-up with PCP and cardiology as an outpatient.  CODE STATUS: DNR  Diet recommendation: Heart healthy/modified carbohydrate diet.  Filed Weights   01/23/21 1801 01/24/21 0438 01/25/21 0500  Weight: 53.6 kg 55.4 kg 49.6 kg    History of present illness:  76 y.o. 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  The patient is unable to provide any significant history secondary to his cognitive impairment.  All history is obtained from review of the medical record and speaking with the patient's spouse.  Accordingly, the patient was recently mated to the hospital from 01/13/2021 to 01/19/2021 for acute respiratory failure due to CHF exacerbation.  He was discharged home with torsemide 20 mg daily.  Since discharge home, the patient has been doing fairly well.  However the patient's spouse states that he drinks at least 2 gallons of water on a  daily basis.  In addition, he eats whatever he wants.  There is been no reports of headache, fever, chills, chest pain, nausea, vomiting, abdominal pain, diarrhea. The patient did have an episode of self-limited atrial fibrillation in June when he underwent his AKA.  Anticoagulation was deferred at the time due to his high fall risk and low A. fib burden.  Because of his worsening shortness of breath, EMS was activated.  Apparently the patient had oxygen saturation of 70% on nonrebreather.  He was placed on BiPAP in the ED.  His wife also notes that the patient has continues to smoke a few cigarettes a day. ED In the ED, patient was afebrile and hemodynamically stable. Chest x-ray showed increased interstitial markings with bibasilar opacities that were better than previous chest x-ray in December.  Repeat VBG showed 7.30 7/58/33/27 on 45% FiO2.  Patient was given furosemide 40 mg IV and started on vancomycin, azithromycin and ceftriaxone.  Antibiotics discontinued when PCT noted to be <0.10  Hospital Course:  1-acute resp failure with hypoxia and hypercarbia -in the setting of pulmonary edema and acute on chronic systolic heart failure -At discharge no requiring oxygen supplementation; good saturation on room air. -Transient use of BiPAP at time of admission with a stabilization of his saturation. -No signs of infiltrates on chest x-ray to suggest infection; procalcitonin < 0.10.  2-acute on chronic systolic heart failure -73/71/06 echo with ejection fraction 25 to 30%; indeterminate diastolic dysfunction; severely elevated PASP moderate mitral and tricuspid regurgitation. -Excellent response to IV Lasix -No longer requiring oxygen supplementation and no crackles on exam. -Patient's torsemide dose has been adjusted at discharge -Instructed to follow low-sodium diet (less than  2 g daily) -Also made aware to follow adequate hydration (no more than 2 L daily). -Instructions provided regarding  importance of daily weights. -Outpatient follow-up with cardiology service will be recommended. -Continue adjusted dose of ARB and beta-blocker.  3-AKI on chronic kidney disease a stage IIIb -Creatinine 1.3-1.6 -Cr up to 1.9 on admission  -Improving back to baseline at discharge. -Repeat basic metabolic panel follow-up visit to assess electrolytes and renal function. -Creatinine back to baseline at discharge.  4-paroxysmal atrial fibrillation -CHADSVASC score 4; no longer on anticoagulation secondary to falls. -Continue metoprolol -Continue follow-up with cardiology service.  5-uncontrolled diabetes mellitus with hyperglycemia and peripheral vascular disease. -A1c 8.8 in December 2022 -Resume home hypoglycemic regimen -Modify carbohydrate diet has been encouraged.  6-essential hypertension -Continue the use of current antihypertensive agents as adjusted (patient using losartan, Demadex and metoprolol). -Heart healthy diet has been encouraged to -Reassess blood pressure at follow-up visit.  7-hyperlipidemia -Continue statins.  8-dementia with behavioral disturbances -Continue the use of Aricept and Risperdal -Consult rotation and supportive care recommended.  9-BPH -Chronic indwelling Foley -No complaining of suprapubic pain or urinary retention symptoms.  10-hypomagnesemia -Repleted  Procedures: See below for x-ray reports.   Consultations: None   Discharge Exam: Vitals:   01/25/21 1100 01/25/21 1230  BP: 109/62 113/67  Pulse: 76 77  Resp: 15 20  Temp:    SpO2: 100% 100%    General: afebrile, no requiring oxygen supplementation; no nausea, no vomiting. Cardiovascular: S1 and S2, no rubs, no gallops, no JVD. Respiratory: improved air movement bilaterally, no wheezing, no crackles.  Abdomen: soft, NT, ND, positive BS Extremities: left AKA; no cyanosis, no clubbing.   Discharge Instructions   Discharge Instructions     (HEART FAILURE PATIENTS) Call MD:   Anytime you have any of the following symptoms: 1) 3 pound weight gain in 24 hours or 5 pounds in 1 week 2) shortness of breath, with or without a dry hacking cough 3) swelling in the hands, feet or stomach 4) if you have to sleep on extra pillows at night in order to breathe.   Complete by: As directed    Diet - low sodium heart healthy   Complete by: As directed    Discharge instructions   Complete by: As directed    Follow low sodium diet (less than 2 gram daily) Check weight on daily basis Control liquids intake to 2L daily Outpatient follow with PCP in 10 days.   Discharge wound care:   Complete by: As directed    Keep area clean and dry; barrier cream and constant repositioning.      Allergies as of 01/25/2021       Reactions   Bee Venom Anaphylaxis   Codeine Other (See Comments)   incoherent  Other reaction(s): Delirium   Propoxyphene Other (See Comments)   Dizziness, "Makes me feel drunk" Other reaction(s): Dizziness   Valsartan Other (See Comments)   incoherent Other reaction(s): Delirium        Medication List     TAKE these medications    acetaminophen 500 MG tablet Commonly known as: TYLENOL Take 1,000 mg by mouth every 6 (six) hours as needed for moderate pain.   Alcohol Pads 70 % Pads USE 1 PAD AS DIRECTED   aspirin EC 81 MG tablet Take 1 tablet (81 mg total) by mouth daily with breakfast.   atorvastatin 40 MG tablet Commonly known as: LIPITOR Take 40 mg by mouth at bedtime.   B-D UF  III MINI PEN NEEDLES 31G X 5 MM Misc Generic drug: Insulin Pen Needle SMARTSIG:1 Each SUB-Q Daily   clopidogrel 75 MG tablet Commonly known as: Plavix Take 1 tablet (75 mg total) by mouth daily.   donepezil 10 MG tablet Commonly known as: ARICEPT Take 10 mg by mouth at bedtime.   feeding supplement (ENSURE COMPLETE) Liqd Take 237 mLs by mouth 2 (two) times daily between meals.   gabapentin 100 MG capsule Commonly known as: NEURONTIN Take 100 mg by mouth at  bedtime.   Lantus 100 UNIT/ML injection Generic drug: insulin glargine Inject 0.06 mLs (6 Units total) into the skin at bedtime as needed (High blood glucose). If Blood glucose over 200   losartan 50 MG tablet Commonly known as: COZAAR Take 0.5 tablets (25 mg total) by mouth daily. What changed: how much to take   metoprolol succinate 50 MG 24 hr tablet Commonly known as: TOPROL-XL Take 1 tablet (50 mg total) by mouth daily. Take with or immediately following a meal.   mirtazapine 15 MG tablet Commonly known as: REMERON Take 0.5 tablets (7.5 mg total) by mouth at bedtime.   multivitamin with minerals tablet Take 1 tablet by mouth daily.   NEOSPORIN EX Apply 1 application topically daily as needed (wound care).   ondansetron 4 MG tablet Commonly known as: ZOFRAN Take 1 tablet (4 mg total) by mouth every 6 (six) hours as needed for nausea.   risperiDONE 0.5 MG tablet Commonly known as: RISPERDAL Take 1 tablet (0.5 mg total) by mouth every 12 (twelve) hours as needed (Restlessness and agitation).   torsemide 20 MG tablet Commonly known as: DEMADEX Take 1.5 tablets (30 mg total) by mouth daily. What changed: how much to take   traMADol 50 MG tablet Commonly known as: ULTRAM Take 50 mg by mouth every 6 (six) hours as needed for moderate pain.   Vitamin D (Ergocalciferol) 1.25 MG (50000 UNIT) Caps capsule Commonly known as: DRISDOL Take 50,000 Units by mouth every Friday.               Discharge Care Instructions  (From admission, onward)           Start     Ordered   01/25/21 0000  Discharge wound care:       Comments: Keep area clean and dry; barrier cream and constant repositioning.   01/25/21 1524           Allergies  Allergen Reactions   Bee Venom Anaphylaxis   Codeine Other (See Comments)    incoherent  Other reaction(s): Delirium   Propoxyphene Other (See Comments)    Dizziness, "Makes me feel drunk" Other reaction(s): Dizziness    Valsartan Other (See Comments)    incoherent Other reaction(s): Delirium    Follow-up Information     [name], MD. Schedule an appointment as soon as possible for a visit in 10 day(s).   Specialty: Internal Medicine Contact information: [address] [place] [address] [phone]         Satira Sark, MD .   Specialty: Cardiology Contact information: [place] [address] [phone]                 The results of significant diagnostics from this hospitalization (including imaging, microbiology, ancillary and laboratory) are listed below for reference.    Significant Diagnostic Studies: US Venous Img Upper Uni Left (DVT)  Result Date: 01/18/2021 CLINICAL DATA:  LEFT upper extremity edema.  EXAM: LEFT UPPER EXTREMITY VENOUS DOPPLER ULTRASOUND TECHNIQUE: Gray-scale sonography with graded compression, as well as color Doppler and duplex ultrasound were performed to evaluate the upper extremity deep venous system from the level of the subclavian vein and including the jugular, axillary, basilic, radial, ulnar and upper cephalic vein. Spectral Doppler was utilized to evaluate flow at rest and with distal augmentation maneuvers. COMPARISON:  Chest XR, 01/13/2021.  CTA chest, 06/29/2020. FINDINGS: VENOUS Normal compressibility of the LEFT internal jugular, subclavian, axillary, cephalic, basilic, brachial, radial and ulnar veins. No filling defects to suggest DVT on grayscale or color Doppler imaging. Doppler waveforms show normal direction of venous flow, normal respiratory plasticity and response to augmentation. Limited views of the contralateral subclavian vein are unremarkable. OTHER Incidental, LEFT carotid atherosclerosis.  Incompletely assessed. Subcutaneous edema, within the imaged LEFT upper extremity. Incompletely imaged. Limitations: none IMPRESSION: No evidence of DVT within the LEFT upper extremity. Michaelle Birks, MD [facility] Radiology Electronically Signed   By: Michaelle Birks M.D.   On: 01/18/2021 12:04   DG Chest Portable 1 View  Result Date: 01/23/2021 CLINICAL DATA:  Shortness of breath EXAM: PORTABLE CHEST 1 VIEW COMPARISON:  January 13, 2021 FINDINGS: The heart size and mediastinal contours are stable. The heart size is upper limits are normal. Diffuse increased pulmonary interstitium is identified bilaterally. Patchy consolidation of bilateral lung bases are noted slightly improved compared to prior exam. Small left pleural effusion is identified. The visualized skeletal structures are stable. IMPRESSION: 1. Increased pulmonary interstitial markings in bilateral lungs, in part chronic, but overlying interstitial edema is not excluded. 2. Patchy consolidation of bilateral lung bases slightly improved compared prior exam. Suspect pneumonia. Electronically Signed   By: Abelardo Diesel M.D.   On: 01/23/2021 09:45   DG Chest Portable 1 View  Result Date: 01/13/2021 CLINICAL DATA:  Shortness of breath. EXAM: PORTABLE CHEST 1 VIEW COMPARISON:  September 27, 2020 FINDINGS: Stable moderate to marked severity diffuse chronic appearing increased interstitial lung markings are seen. Mild bilateral lower lobe infiltrates are seen, left greater than right. There is a small left pleural effusion. No pneumothorax is identified. The cardiac silhouette is enlarged and unchanged in size. There is moderate severity calcification of the aortic arch. A radiopaque fusion plate and screws are seen overlying the cervical spine. IMPRESSION: 1. Stable moderate to marked severity diffuse chronic appearing increased interstitial lung markings with mild bilateral lower lobe infiltrates, left greater than right. 2. Small left pleural effusion. Electronically Signed   By: Virgina Norfolk M.D.   On: 01/13/2021 19:10   ECHOCARDIOGRAM COMPLETE  Result Date: 01/14/2021    ECHOCARDIOGRAM REPORT   Patient  Name:   AURELIANO OSHIELDS Date of Exam: 01/14/2021 Medical Rec #:  [id number]      Height:       73.0 in Accession #:    [id number]     Weight:       120.4 lb Date of Birth:  January 13, 1946     BSA:          1.734 m Patient Age:    42 years       BP:           116/90 mmHg Patient Gender: M              HR:           77 bpm. Exam Location:  [facility] Procedure: 2D Echo, Cardiac Doppler and Color Doppler Indications:  Congestive Heart Failure I50.9  History:        Patient has prior history of Echocardiogram examinations, most                 recent 09/27/2020. CHF, COPD, Arrythmias:Atrial Fibrillation; Risk                 Factors:Diabetes and Hypertension. PTSD, dementia,                 schizophrenia.  Sonographer:    Alvino Chapel RCS Referring Phys: [id number] ASIA B [place]  Sonographer Comments: Patient confused/combative at times. IMPRESSIONS  1. Left ventricular ejection fraction, by estimation, is 25 to 30%. The left ventricle has severely decreased function. The left ventricle demonstrates global hypokinesis with some regional variation. There is moderate left ventricular hypertrophy. Left  ventricular diastolic parameters are indeterminate.  2. Right ventricular systolic function is mildly reduced. The right ventricular size is normal. There is severely elevated pulmonary artery systolic pressure. The estimated right ventricular systolic pressure is 01.7 mmHg.  3. Left atrial size was moderately dilated.  4. Right atrial size was moderately dilated.  5. There is a trivial pericardial effusion posterior to the left ventricle and localized near the right atrium.  6. The mitral valve is degenerative. Moderate mitral valve regurgitation.  7. Tricuspid valve regurgitation is moderate.  8. The aortic valve is tricuspid. There is mild calcification of the aortic valve. Aortic valve regurgitation is not visualized.  9. The inferior vena cava is dilated in size with <50% respiratory variability, suggesting right atrial  pressure of 15 mmHg. Comparison(s): Prior images reviewed side by side. LVEF has decreased further and estimated PASP is higher. FINDINGS  Left Ventricle: Left ventricular ejection fraction, by estimation, is 25 to 30%. The left ventricle has severely decreased function. The left ventricle demonstrates global hypokinesis. The left ventricular internal cavity size was normal in size. There is moderate left ventricular hypertrophy. Left ventricular diastolic parameters are indeterminate. Right Ventricle: The right ventricular size is normal. No increase in right ventricular wall thickness. Right ventricular systolic function is mildly reduced. There is severely elevated pulmonary artery systolic pressure. The tricuspid regurgitant velocity is 3.56 m/s, and with an assumed right atrial pressure of 15 mmHg, the estimated right ventricular systolic pressure is 79.3 mmHg. Left Atrium: Left atrial size was moderately dilated. Right Atrium: Right atrial size was moderately dilated. Pericardium: Trivial pericardial effusion is present. The pericardial effusion is posterior to the left ventricle and localized near the right atrium. Mitral Valve: The mitral valve is degenerative in appearance. There is mild thickening of the mitral valve leaflet(s). Moderate mitral valve regurgitation. Tricuspid Valve: The tricuspid valve is grossly normal. Tricuspid valve regurgitation is moderate. Aortic Valve: The aortic valve is tricuspid. There is mild calcification of the aortic valve. There is mild aortic valve annular calcification. Aortic valve regurgitation is not visualized. Pulmonic Valve: The pulmonic valve was grossly normal. Pulmonic valve regurgitation is trivial. Aorta: The aortic root is normal in size and structure. Venous: The inferior vena cava is dilated in size with less than 50% respiratory variability, suggesting right atrial pressure of 15 mmHg. IAS/Shunts: No atrial level shunt detected by color flow Doppler.  Additional Comments: There is pleural effusion in the left lateral region.  LEFT VENTRICLE PLAX 2D LVIDd:         5.20 cm LVIDs:         4.40 cm LV PW:         1.50  cm LV IVS:        1.30 cm LVOT diam:     1.90 cm LV SV:         27 LV SV Index:   15 LVOT Area:     2.84 cm  RIGHT VENTRICLE TAPSE (M-mode): 1.2 cm LEFT ATRIUM             Index        RIGHT ATRIUM           Index LA diam:        3.90 cm 2.25 cm/m   RA Area:     23.70 cm LA Vol (A2C):   82.1 ml 47.35 ml/m  RA Volume:   79.70 ml  45.96 ml/m LA Vol (A4C):   83.5 ml 48.15 ml/m LA Biplane Vol: 85.9 ml 49.54 ml/m  AORTIC VALVE LVOT Vmax:   54.00 cm/s LVOT Vmean:  34.800 cm/s LVOT VTI:    0.094 m  AORTA Ao Root diam: 3.80 cm MITRAL VALVE                  TRICUSPID VALVE MV Area (PHT): 2.91 cm       TR Peak grad:   50.7 mmHg MV Decel Time: 261 msec       TR Vmax:        356.00 cm/s MR Peak grad:    71.6 mmHg MR Mean grad:    51.0 mmHg    SHUNTS MR Vmax:         423.00 cm/s  Systemic VTI:  0.09 m MR Vmean:        334.0 cm/s   Systemic Diam: 1.90 cm MR PISA:         4.02 cm MR PISA Eff ROA: 30 mm MR PISA Radius:  0.80 cm MV E velocity: 88.10 cm/s Rozann Lesches MD Electronically signed by Rozann Lesches MD Signature Date/Time: 01/14/2021/2:11:21 PM    Final     Microbiology: Recent Results (from the past 240 hour(s))  Resp Panel by RT-PCR (Flu A&B, Covid) Nasopharyngeal Swab     Status: None   Collection Time: 01/23/21 10:05 AM   Specimen: Nasopharyngeal Swab; Nasopharyngeal(NP) swabs in vial transport medium  Result Value Ref Range Status   SARS Coronavirus 2 by RT PCR NEGATIVE NEGATIVE Final    Comment: (NOTE) SARS-CoV-2 target nucleic acids are NOT DETECTED.  The SARS-CoV-2 RNA is generally detectable in upper respiratory specimens during the acute phase of infection. The lowest concentration of SARS-CoV-2 viral copies this assay can detect is 138 copies/mL. A negative result does not preclude SARS-Cov-2 infection and should not be  used as the sole basis for treatment or other patient management decisions. A negative result may occur with  improper specimen collection/handling, submission of specimen other than nasopharyngeal swab, presence of viral mutation(s) within the areas targeted by this assay, and inadequate number of viral copies(<138 copies/mL). A negative result must be combined with clinical observations, patient history, and epidemiological information. The expected result is Negative.  Fact Sheet for Patients:  EntrepreneurPulse.com.au  Fact Sheet for Healthcare Providers:  IncredibleEmployment.be  This test is no t yet approved or cleared by the [place] FDA and  has been authorized for detection and/or diagnosis of SARS-CoV-2 by FDA under an Emergency Use Authorization (EUA). This EUA will remain  in effect (meaning this test can be used) for the duration of the COVID-19 declaration under Section 564(b)(1) of the Act, 21 U.S.C.section 360bbb-3(b)(1), unless the  authorization is terminated  or revoked sooner.       Influenza A by PCR NEGATIVE NEGATIVE Final   Influenza B by PCR NEGATIVE NEGATIVE Final    Comment: (NOTE) The Xpert Xpress SARS-CoV-2/FLU/RSV plus assay is intended as an aid in the diagnosis of influenza from Nasopharyngeal swab specimens and should not be used as a sole basis for treatment. Nasal washings and aspirates are unacceptable for Xpert Xpress SARS-CoV-2/FLU/RSV testing.  Fact Sheet for Patients: EntrepreneurPulse.com.au  Fact Sheet for Healthcare Providers: IncredibleEmployment.be  This test is not yet approved or cleared by the [place] FDA and has been authorized for detection and/or diagnosis of SARS-CoV-2 by FDA under an Emergency Use Authorization (EUA). This EUA will remain in effect (meaning this test can be used) for the duration of the COVID-19 declaration under Section  564(b)(1) of the Act, 21 U.S.C. section 360bbb-3(b)(1), unless the authorization is terminated or revoked.  Performed at [facility], [address] [place], [place] [address]      Labs: Basic Metabolic Panel: Recent Labs  Lab 01/18/21 2316 01/19/21 0531 01/23/21 0944 01/24/21 0412 01/25/21 0534  NA  --  139 136 141 141  K  --  3.9 4.9 4.4 3.8  CL  --  101 98 101 100  CO2  --  27 28 30  33*  GLUCOSE 396* 266* 413* 137* 303*  BUN  --  46* 34* 35* 37*  CREATININE  --  1.96* 1.60* 1.32* 1.39*  CALCIUM  --  8.4* 8.7* 9.5 9.3  MG  --   --  1.6* 1.7  --    CBC: Recent Labs  Lab 01/19/21 0531 01/23/21 0944  WBC 7.9 10.8*  HGB 8.8* 8.8*  HCT 27.1* 27.3*  MCV 97.1 100.7*  PLT 336 316   BNP (last 3 results) Recent Labs    01/13/21 1905 01/14/21 0447 01/23/21 0946  BNP 2,873.0* 2,752.0* 1,512.0*   CBG: Recent Labs  Lab 01/24/21 1148 01/24/21 1625 01/24/21 2116 01/25/21 0757 01/25/21 1155  GLUCAP 263* 198* 251* 289* 196*    Signed:  Barton Dubois MD.  [facility] 01/25/2021, 3:29 PM

## 2021-01-25 NOTE — Progress Notes (Signed)
Inpatient Diabetes Program Recommendations  AACE/ADA: New Consensus Statement on Inpatient Glycemic Control   Target Ranges:  Prepandial:   less than 140 mg/dL      Peak postprandial:   less than 180 mg/dL (1-2 hours)      Critically ill patients:  140 - 180 mg/dL    Latest Reference Range & Units 01/24/21 07:23 01/24/21 11:48 01/24/21 16:25 01/24/21 21:16 01/25/21 07:57  Glucose-Capillary 70 - 99 mg/dL 276 (H) 263 (H) 198 (H) 251 (H) 289 (H)   Review of Glycemic Control  Diabetes history: DM2 Outpatient Diabetes medications: Lantus 6  units QHS if CBGs over 200 mg/dl Current orders for Inpatient glycemic control: Novolog 0-9 units TID with meals, Novolog 0-5 units QHS   Inpatient Diabetes Program Recommendations:     Insulin: Please consider ordering Lantus 5 units Q24H.   Thanks, Barnie Alderman, RN, MSN, CDE Diabetes Coordinator Inpatient Diabetes Program [phone] (Team Pager from 8am to 5pm)

## 2021-01-25 NOTE — Progress Notes (Signed)
SATURATION QUALIFICATIONS:  Patient Saturations on Room Air at Rest = 96%  Patient Is too weak to ambulate right now.

## 2021-01-26 ENCOUNTER — Other Ambulatory Visit: Payer: Self-pay

## 2021-01-26 ENCOUNTER — Ambulatory Visit (HOSPITAL_COMMUNITY): Payer: Medicare Other | Attending: Surgical

## 2021-01-26 DIAGNOSIS — R262 Difficulty in walking, not elsewhere classified: Secondary | ICD-10-CM | POA: Diagnosis not present

## 2021-01-26 DIAGNOSIS — M6281 Muscle weakness (generalized): Secondary | ICD-10-CM | POA: Diagnosis not present

## 2021-01-26 DIAGNOSIS — R2681 Unsteadiness on feet: Secondary | ICD-10-CM | POA: Diagnosis not present

## 2021-01-26 NOTE — Therapy (Signed)
[place], [place], [address] Phone: [phone]   Fax:  [phone]  Physical Therapy Evaluation  Patient Details  Name: Allen Yu MRN: [id number] Date of Birth: 1945/05/01 Referring Provider (PT): Nita Sickle, MD   Encounter Date: 01/26/2021   PT End of Session - 01/26/21 1254     Visit Number 1    Number of Visits 15    Date for PT Re-Evaluation 04/20/21    Authorization Type Medicare Part A and B; [employer]    PT Start Time 1300    PT Stop Time 1345    PT Time Calculation (min) 45 min    Activity Tolerance Patient tolerated treatment well;Patient limited by fatigue    Behavior During Therapy [facility] for tasks assessed/performed             Past Medical History:  Diagnosis Date   Anemia    Arthritis    Carotid stenosis    Chronic back pain    Chronic HFrEF (heart failure with reduced ejection fraction) ([place])    a. 09/2020 Echo: EF 45%, mild LVH; b. 12/2020 Echo: EF 25-30%, glob HK, mod LVH, mildly reduced RV fxn, RVSP 65.59mmHg, mod BAE. Triv effusion. Mod MR/TR.   CKD (chronic kidney disease), stage III ([place])    Constipation    Dementia ([place])    Diabetes mellitus    Dilated cardiomyopathy ([place])    a. 09/2020 Echo: EF 45%; b. 12/2020 Echo: EF 25-30%.   GERD (gastroesophageal reflux disease)    History of kidney stones    Hypertension    Lung nodule    PAF (paroxysmal atrial fibrillation) (HCC)    Peripheral neuropathy    Peripheral vascular disease ([place])    a. 06/2020 s/p L AKA; b. 10/2020 s/p R SFA and above-knee popliteal PTA.   PTSD (post-traumatic stress disorder)    Schizophrenia ([place])    Tuberculosis    Treated    Past Surgical History:  Procedure Laterality Date   ABDOMINAL AORTOGRAM W/LOWER EXTREMITY N/A 05/20/2020   Procedure: ABDOMINAL AORTOGRAM W/LOWER EXTREMITY;  Surgeon: Elam Dutch, MD;  Location: [facility];  Service: Cardiovascular;  Laterality: N/A;    ABDOMINAL AORTOGRAM W/LOWER EXTREMITY N/A 10/27/2020   Procedure: ABDOMINAL AORTOGRAM W/LOWER EXTREMITY;  Surgeon: Marty Heck, MD;  Location: [facility];  Service: Cardiovascular;  Laterality: N/A;   AMPUTATION Left 06/28/2020   Procedure: AMPUTATION ABOVE KNEE LEFT;  Surgeon: Rosetta Posner, MD;  Location: [facility] OR;  Service: Vascular;  Laterality: Left;   [place], 2013   x2   BLADDER SURGERY     02   CHOLECYSTECTOMY     COLONOSCOPY     COLONOSCOPY N/A 12/07/2014   Procedure: COLONOSCOPY;  Surgeon: Daneil Dolin, MD;  [facility];  Service: Endoscopy;  Laterality: N/A;  10:30 Am   EYE SURGERY Bilateral    removed metal from eye   FEMORAL-TIBIAL BYPASS GRAFT Left 05/27/2020   Procedure: LEFT FEMORAL TO PERONEAL ARTERY BYPASS;  Surgeon: Rosetta Posner, MD;  Location: [facility];  Service: Vascular;  Laterality: Left;   HERNIA REPAIR Right    INGUINAL HERNIA REPAIR Left 11/03/2013   Procedure: HERNIA REPAIR INGUINAL ADULT;  Surgeon: Gayland Curry, MD;  Location: [facility];  Service: General;  Laterality: Left;   PERIPHERAL VASCULAR INTERVENTION  10/27/2020   Procedure: PERIPHERAL VASCULAR INTERVENTION;  Surgeon: Marty Heck, MD;  Location: [facility];  Service: Cardiovascular;;   SHOULDER SURGERY     RIGHT SHOULDER    VIDEO BRONCHOSCOPY WITH ENDOBRONCHIAL NAVIGATION N/A 07/10/2019   Procedure: VIDEO BRONCHOSCOPY WITH ENDOBRONCHIAL NAVIGATION;  Surgeon: [place] Nakayama, MD;  Location: [facility];  Service: Thoracic;  Laterality: N/A;    There were no vitals filed for this visit.    Subjective Assessment - 01/26/21 1259     Subjective Pt underwent left AKA 06/28/20 and has multiple health co-morbidities.  Pt has supportive family which lives with him 24/7 and pt has been using w/c for mobility since time of amputation.  Spouse reports recent visit with NP for assessment.  Family would like to pursue prosthetic fitting and training if  indicated.    Currently in Pain? No/denies    Pain Score 0-No pain                [facility] PT Assessment - 01/26/21 0001       Assessment   Medical Diagnosis Gen. weakness' left AKA    Referring Provider (PT) Nita Sickle, MD    Onset Date/Surgical Date 06/28/20      Balance Screen   Has the patient fallen in the past 6 months No    Has the patient had a decrease in activity level because of a fear of falling?  No    Is the patient reluctant to leave their home because of a fear of falling?  No      Home Ecologist residence    Living Arrangements Spouse/significant other;Other relatives    Available Help at Discharge Family;Personal care attendant    Type of [place] to live on main level with bedroom/bathroom    [place] - 2 wheels;Shower seat;Wheelchair - manual      Prior Function   Level of Independence Needs assistance with ADLs;Needs assistance with homemaking;Needs assistance with transfers    Vocation Retired      [employer]   Overall Cognitive Status History of cognitive impairments - at baseline      Observation/Other Assessments   Observations quite emaciated, chronically-ill appearing    Skin Integrity spouse reports RLE plantar wounds which have healed      ROM / Strength   AROM / PROM / Strength AROM;Strength      AROM   Overall AROM  Within functional limits for tasks performed    Overall AROM Comments RLE WNL, beginning to develop left hip flexor contracture lacking 5 degrees to neutral      Strength   Overall Strength Comments LLE residual limb 3/5. RLE 3/5 gross      Bed Mobility   Bed Mobility Supine to Sit;Sit to Supine    Supine to Sit Contact Guard/Touching assist    Sit to Supine Minimal Assistance - Patient > 75%      Transfers   Transfers Sit to [employer];Lateral/Scoot Transfers    Sit to Stand 2: Max Nurse, children's  Transfers 3: Mod assist;2: Max assist    Lateral/Scoot Transfers 4: Min assist      Ambulation/Gait   Gait Comments DNT due to gen. weakness, unsafe to attempt single leg hop      Press photographer Both upper extremities    Wheelchair [employer] Management  Needs assistance                        Objective measurements completed on examination: See above findings.                PT Education - 01/26/21 1445     Education Details spouse and caregiver educated in positioning and LE PRE initiation to prepare for demands of prosthetic training    Person(s) Educated Patient;Spouse;Caregiver(s)    Methods Explanation;Demonstration;Handout    Comprehension Verbalized understanding              PT Short Term Goals - 01/26/21 1455       PT SHORT TERM GOAL #1   Title Demonstrate bed mobility with modified independence to reduce level of assistance    Baseline min-mod A    Time 6    Period Weeks    Status New    Target Date 03/09/21      PT SHORT TERM GOAL #2   Title Sit to stand transfer with min A with RW to improve standing tolerance    Baseline max A with HHA    Time 6    Period Weeks    Status New    Target Date 03/09/21      PT SHORT TERM GOAL #3   Title Squat-pivot transfer min A to improve safety with maneuvering in small spaces    Baseline max A    Time 6    Period Weeks    Status New    Target Date 03/09/21      PT SHORT TERM GOAL #4   Title Demo RLE 4/5 to prepare for gait/transfers    Baseline 3/5    Time 6    Period Weeks    Status New    Target Date 03/09/21               PT Long Term Goals - 01/26/21 1457       PT LONG TERM GOAL #1   Title Ambulate x 25 ft with RW with/without prosthetic to improve gait tolerance    Baseline unable    Time 12    Period Weeks    Status New    Target Date 04/20/21      PT LONG TERM GOAL #2   Title Stand-pivot transfer with  supervision to increase functional independence    Baseline max A    Time 12    Period Weeks    Status New    Target Date 04/20/21                    Plan - 01/26/21 1446     Clinical Impression Statement Pt is 76 yo gentleman with multiple co-morbidities who underwent left AKA in June of 2022 and presents with overall decline in functional independence and mobility requiring increased need for caregiver assistance in all aspects.  Patient and family seek PT intervention to improve functional independence and progress to prosthetic training to improve self-performance of transfers and ambulation.  Demonstrates generalized weakness and reduced functional activity tolerance indicating need for PT services to train/instruct in HEP, improve safety with transfers, increase LE strength/dynamic balance to prepare for rigors of prosthetic training.    Personal Factors and Comorbidities Age;Comorbidity 3+;Time since onset of injury/illness/exacerbation;Fitness    Comorbidities dementia, CKD, DM    Examination-Activity Limitations Bed Mobility;Lift;Toileting;Stand;Reach Overhead;Locomotion Level;Transfers    Stability/Clinical Decision Making Evolving/Moderate complexity  Clinical Decision Making Moderate    Rehab Potential Fair    PT Frequency 1x / week    PT Duration 12 weeks    PT Treatment/Interventions ADLs/Self Care Home Management;DME Instruction;Gait training;Functional mobility training;Therapeutic activities;Therapeutic exercise;Balance training;Prosthetic Training;Patient/family education;Neuromuscular re-education;Wheelchair mobility training;Manual techniques;Taping    PT Next Visit Plan assess for current HEP    PT Home Exercise Plan left hip flexor stretch (Thomas test position), hip extension isometric, RLE SLR    Consulted and Agree with Plan of Care Patient;Family member/caregiver    Family Member Consulted spouse and PCA             Patient will benefit from  skilled therapeutic intervention in order to improve the following deficits and impairments:  Decreased activity tolerance, Decreased balance, Decreased mobility, Decreased knowledge of use of DME, Decreased endurance, Decreased range of motion, Decreased strength, Difficulty walking, Impaired flexibility, Decreased safety awareness  Visit Diagnosis: Muscle weakness (generalized)  Unsteadiness on feet  Difficulty in walking, not elsewhere classified     Problem List Patient Active Problem List   Diagnosis Date Noted   Acute respiratory failure with hypoxia and hypercarbia (HCC) 01/23/2021   CKD (chronic kidney disease) stage 3, GFR 30-59 ml/min (HCC) 01/23/2021   Acute on chronic systolic CHF (congestive heart failure) ([place]) 01/23/2021   Acute respiratory failure with hypoxia ([place]) 01/13/2021   Encounter for fitting and adjustment of hearing aid 12/21/2020   Other specified problems related to psychosocial circumstances 11/21/2020   Sensorineural hearing loss, bilateral 11/21/2020   Tobacco dependence 10/06/2020   Benign prostatic hyperplasia with urinary obstruction 10/06/2020   Above knee amputation of left lower extremity ([place]) 10/06/2020   Other specified counseling 10/06/2020   Severe sepsis ([place]) 09/24/2020   Schizophrenia, unspecified ([place]) 09/24/2020   Unspecified dementia, unspecified severity, without behavioral disturbance, psychotic disturbance, mood disturbance, and anxiety ([place]) 09/24/2020   Lung nodule 09/24/2020   Indwelling Foley catheter present 09/24/2020   Peripheral vascular disease ([place]) 09/24/2020   Mass of testicle 09/24/2020   Problem related to unspecified psychosocial circumstances 08/01/2020   Hypermetropia 07/22/2020   Unspecified atrial fibrillation ([place]) 07/22/2020   Klebsiella sepsis ([place]) 07/22/2020   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   SIRS (systemic inflammatory response syndrome) ([place]) 16/38/4665   Acute metabolic encephalopathy 99/35/7017    Hyponatremia    S/P AKA (above knee amputation) unilateral, left ([place]) 06/28/2020   Gangrene of left foot ([place]) 06/28/2020   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 ([place]) 05/27/2020   Critical lower limb ischemia ([place]) 05/21/2020   Malnutrition ([place]) 05/19/2020   Plantar callus 12/24/2019   Cavitary pneumonia 09/14/2019   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   Unintentional weight loss 09/14/2019   Coronary artery calcification seen on CT scan 10/28/2018   Hilar adenopathy 04/10/2016   Lung nodule seen on imaging study 07/09/2015   Acute encephalopathy    Hypoglycemia due to insulin    Type 2 diabetes mellitus with other circulatory complications ([place]) 79/39/0300   Acute kidney injury superimposed on chronic kidney disease ([place]) 07/08/2015   Tobacco use disorder 07/08/2015   Diverticulosis of colon without hemorrhage    Left inguinal hernia 09/16/2013   Altered mental status 11/17/2012   Hypothermia 11/17/2012   Leukocytosis 11/17/2012   Chronic kidney disease, stage 3 unspecified ([place])    Post-traumatic stress disorder, chronic    GERD (gastroesophageal reflux disease)    Essential (primary) hypertension 09/10/2011   Anemia 09/10/2011   Diabetic neuropathy ([place]) 09/10/2011  DDD (degenerative disc disease), lumbar 09/10/2011   Psychosis ([place]) 09/10/2011   Diabetes mellitus type 2, uncontrolled 09/10/2011   Hyperlipemia 09/10/2011   Microalbuminuria 09/10/2011    Toniann Fail, PT 01/26/2021, 2:59 PM  [facility] [address] [place], [place], [address] Phone: [phone]   Fax:  [phone]  Name: Allen Yu MRN: [id number] Date of Birth: 1945/02/24

## 2021-01-27 DIAGNOSIS — D630 Anemia in neoplastic disease: Secondary | ICD-10-CM | POA: Diagnosis not present

## 2021-01-27 DIAGNOSIS — I4821 Permanent atrial fibrillation: Secondary | ICD-10-CM | POA: Diagnosis not present

## 2021-01-27 DIAGNOSIS — Z89612 Acquired absence of left leg above knee: Secondary | ICD-10-CM | POA: Diagnosis not present

## 2021-01-27 DIAGNOSIS — I5040 Unspecified combined systolic (congestive) and diastolic (congestive) heart failure: Secondary | ICD-10-CM | POA: Diagnosis not present

## 2021-01-30 ENCOUNTER — Ambulatory Visit (HOSPITAL_COMMUNITY): Payer: Medicare Other

## 2021-01-30 ENCOUNTER — Encounter (HOSPITAL_COMMUNITY): Payer: Self-pay

## 2021-01-30 ENCOUNTER — Other Ambulatory Visit: Payer: Self-pay

## 2021-01-30 DIAGNOSIS — R2681 Unsteadiness on feet: Secondary | ICD-10-CM | POA: Diagnosis not present

## 2021-01-30 DIAGNOSIS — R262 Difficulty in walking, not elsewhere classified: Secondary | ICD-10-CM | POA: Diagnosis not present

## 2021-01-30 DIAGNOSIS — M6281 Muscle weakness (generalized): Secondary | ICD-10-CM

## 2021-01-30 NOTE — Therapy (Signed)
[place], [place], [address] Phone: [phone]   Fax:  [phone]  Physical Therapy Treatment  Patient Details  Name: Allen Yu MRN: [id number] Date of Birth: July 23, 1945 Referring Provider (PT): Nita Sickle, MD   Encounter Date: 01/30/2021   PT End of Session - 01/30/21 1058     Visit Number 2    Number of Visits 15    Date for PT Re-Evaluation 04/20/21    Authorization Type Medicare Part A and B; Tric[employer] for Life    PT Start Time 1105    PT Stop Time 1151    PT Time Calculation (min) 46 min    Activity Tolerance Patient tolerated treatment well;Patient limited by fatigue    Behavior During Therapy [facility] for tasks assessed/performed             Past Medical History:  Diagnosis Date   Anemia    Arthritis    Carotid stenosis    Chronic back pain    Chronic HFrEF (heart failure with reduced ejection fraction) ([place])    a. 09/2020 Echo: EF 45%, mild LVH; b. 12/2020 Echo: EF 25-30%, glob HK, mod LVH, mildly reduced RV fxn, RVSP 65.30mmHg, mod BAE. Triv effusion. Mod MR/TR.   CKD (chronic kidney disease), stage III ([place])    Constipation    Dementia ([place])    Diabetes mellitus    Dilated cardiomyopathy ([place])    a. 09/2020 Echo: EF 45%; b. 12/2020 Echo: EF 25-30%.   GERD (gastroesophageal reflux disease)    History of kidney stones    Hypertension    Lung nodule    PAF (paroxysmal atrial fibrillation) (HCC)    Peripheral neuropathy    Peripheral vascular disease ([place])    a. 06/2020 s/p L AKA; b. 10/2020 s/p R SFA and above-knee popliteal PTA.   PTSD (post-traumatic stress disorder)    Schizophrenia ([place])    Tuberculosis    Treated    Past Surgical History:  Procedure Laterality Date   ABDOMINAL AORTOGRAM W/LOWER EXTREMITY N/A 05/20/2020   Procedure: ABDOMINAL AORTOGRAM W/LOWER EXTREMITY;  Surgeon: Elam Dutch, MD;  Location: [place] [facility];  Service: Cardiovascular;  Laterality: N/A;    ABDOMINAL AORTOGRAM W/LOWER EXTREMITY N/A 10/27/2020   Procedure: ABDOMINAL AORTOGRAM W/LOWER EXTREMITY;  Surgeon: Marty Heck, MD;  Location: [place] [facility];  Service: Cardiovascular;  Laterality: N/A;   AMPUTATION Left 06/28/2020   Procedure: AMPUTATION ABOVE KNEE LEFT;  Surgeon: Rosetta Posner, MD;  Location: [facility] OR;  Service: Vascular;  Laterality: Left;   [place], 2013   x2   BLADDER SURGERY     02   CHOLECYSTECTOMY     COLONOSCOPY     COLONOSCOPY N/A 12/07/2014   Procedure: COLONOSCOPY;  Surgeon: Daneil Dolin, MD;  [facility];  Service: Endoscopy;  Laterality: N/A;  10:30 Am   EYE SURGERY Bilateral    removed metal from eye   FEMORAL-TIBIAL BYPASS GRAFT Left 05/27/2020   Procedure: LEFT FEMORAL TO PERONEAL ARTERY BYPASS;  Surgeon: Rosetta Posner, MD;  Location: [place];  Service: Vascular;  Laterality: Left;   HERNIA REPAIR Right    INGUINAL HERNIA REPAIR Left 11/03/2013   Procedure: HERNIA REPAIR INGUINAL ADULT;  Surgeon: Gayland Curry, MD;  Location: [place];  Service: General;  Laterality: Left;   PERIPHERAL VASCULAR INTERVENTION  10/27/2020   Procedure: PERIPHERAL VASCULAR INTERVENTION;  Surgeon: Marty Heck, MD;  Location: [place] [facility];  Service: Cardiovascular;;   SHOULDER SURGERY     RIGHT SHOULDER    VIDEO BRONCHOSCOPY WITH ENDOBRONCHIAL NAVIGATION N/A 07/10/2019   Procedure: VIDEO BRONCHOSCOPY WITH ENDOBRONCHIAL NAVIGATION;  Surgeon: Melrose Nakayama, MD;  Location: [place];  Service: Thoracic;  Laterality: N/A;    There were no vitals filed for this visit.   Subjective Assessment - 01/30/21 1200     Subjective Caregivers report they have been doing the HEP a little but don't remember how to do them all. Seems like patient is a bit tired today.    Patient is accompained by: Family member   PCA              [facility] Adult PT Treatment/Exercise - 01/30/21 0001       Bed Mobility   Bed Mobility Supine to  Sit;Sit to Supine    Supine to Sit Minimal Assistance - Patient > 75%    Sit to Supine Supervision/Verbal cueing      Transfers   Transfers Sit to Stand;Lateral/Scoot Transfers;Stand Pivot Transfers    Sit to Stand 2: Max assist    Stand Pivot Transfers 3: Mod assist;2: Max assist    Lateral/Scoot Transfers 4: Min guard      Exercises   Exercises Knee/Hip      Knee/Hip Exercises: Stretches   Hip Flexor Stretch Left;2 reps;10 seconds    Hip Flexor Stretch Limitations supine with manual resistance      Knee/Hip Exercises: Supine   Short Arc Quad Sets Strengthening;Right;1 set;5 reps    Hip Adduction Isometric Strengthening;Both;1 set;5 reps    Hip Adduction Isometric Limitations in supine vs hooklying    Single Leg Bridge Strengthening;Right;1 set;5 reps    Other Supine Knee/Hip Exercises left hip extension isometric on towel roll      Knee/Hip Exercises: Sidelying   Hip ABduction Strengthening;Left;1 set;5 reps    Hip ABduction Limitations manual resistance for hip alignment                PT Education - 01/30/21 1201     Education Details Reviewed inital evaluation, initial HEP, and goals. Advanced HEP. Discussed purpose and technique of skilled interventions throughout session.    Person(s) Educated Patient;Caregiver(s);Spouse    Methods Explanation;Handout    Comprehension Verbalized understanding              PT Short Term Goals - 01/26/21 1455       PT SHORT TERM GOAL #1   Title Demonstrate bed mobility with modified independence to reduce level of assistance    Baseline min-mod A    Time 6    Period Weeks    Status New    Target Date 03/09/21      PT SHORT TERM GOAL #2   Title Sit to stand transfer with min A with RW to improve standing tolerance    Baseline max A with HHA    Time 6    Period Weeks    Status New    Target Date 03/09/21      PT SHORT TERM GOAL #3   Title Squat-pivot transfer min A to improve safety with maneuvering in small  spaces    Baseline max A    Time 6    Period Weeks    Status New    Target Date 03/09/21      PT SHORT TERM GOAL #4   Title Demo RLE 4/5  to prepare for gait/transfers    Baseline 3/5    Time 6    Period Weeks    Status New    Target Date 03/09/21               PT Long Term Goals - 01/26/21 1457       PT LONG TERM GOAL #1   Title Ambulate x 25 ft with RW with/without prosthetic to improve gait tolerance    Baseline unable    Time 12    Period Weeks    Status New    Target Date 04/20/21      PT LONG TERM GOAL #2   Title Stand-pivot transfer with supervision to increase functional independence    Baseline max A    Time 12    Period Weeks    Status New    Target Date 04/20/21                 Plan - 01/30/21 1059     Clinical Impression Statement Reviewed inital evaluation, initial HEP, Performed stand pivot transfers to/from mat table. Performed mat level exercises in supine and right sidelying adding right single leg bridge, short arc quad, hip adduction isometric. Added seated long arc quad. Advanced HEP adding SAQ and LAQ. Patient would conitnue to benfit from skilled physical therapy to reduce impairment and improve functional abilities.    Personal Factors and Comorbidities Age;Comorbidity 3+;Time since onset of injury/illness/exacerbation;Fitness    Comorbidities dementia, CKD, DM    Examination-Activity Limitations Bed Mobility;Lift;Toileting;Stand;Reach Overhead;Locomotion Level;Transfers    Stability/Clinical Decision Making Evolving/Moderate complexity    Rehab Potential Fair    PT Frequency 1x / week    PT Duration 12 weeks    PT Treatment/Interventions ADLs/Self Care Home Management;DME Instruction;Gait training;Functional mobility training;Therapeutic activities;Therapeutic exercise;Balance training;Prosthetic Training;Patient/family education;Neuromuscular re-education;Wheelchair mobility training;Manual techniques;Taping    PT Next Visit Plan  assess for current HEP    PT Home Exercise Plan left hip flexor stretch (Thomas test position), hip extension isometric, RLE SLR; 19 - SAQ, LAQ    Consulted and Agree with Plan of Care Patient;Family member/caregiver    Family Member Consulted spouse and PCA             Patient will benefit from skilled therapeutic intervention in order to improve the following deficits and impairments:  Decreased activity tolerance, Decreased balance, Decreased mobility, Decreased knowledge of use of DME, Decreased endurance, Decreased range of motion, Decreased strength, Difficulty walking, Impaired flexibility, Decreased safety awareness  Visit Diagnosis: Muscle weakness (generalized)  Unsteadiness on feet  Difficulty in walking, not elsewhere classified     Problem List Patient Active Problem List   Diagnosis Date Noted   Acute respiratory failure with hypoxia and hypercarbia (HCC) 01/23/2021   CKD (chronic kidney disease) stage 3, GFR 30-59 ml/min (HCC) 01/23/2021   Acute on chronic systolic CHF (congestive heart failure) ([place]) 01/23/2021   Acute respiratory failure with hypoxia ([place]) 01/13/2021   Encounter for fitting and adjustment of hearing aid 12/21/2020   Other specified problems related to psychosocial circumstances 11/21/2020   Sensorineural hearing loss, bilateral 11/21/2020   Tobacco dependence 10/06/2020   Benign prostatic hyperplasia with urinary obstruction 10/06/2020   Above knee amputation of left lower extremity ([place]) 10/06/2020   Other specified counseling 10/06/2020   Severe sepsis ([place]) 09/24/2020   Schizophrenia, unspecified ([place]) 09/24/2020   Unspecified dementia, unspecified severity, without behavioral disturbance, psychotic disturbance, mood disturbance, and anxiety ([place]) 09/24/2020   Lung nodule 09/24/2020  Indwelling Foley catheter present 09/24/2020   Peripheral vascular disease ([place]) 09/24/2020   Mass of testicle 09/24/2020   Problem related to unspecified  psychosocial circumstances 08/01/2020   Hypermetropia 07/22/2020   Unspecified atrial fibrillation ([place]) 07/22/2020   Klebsiella sepsis ([place]) 07/22/2020   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   SIRS (systemic inflammatory response syndrome) ([place]) 82/70/7867   Acute metabolic encephalopathy 54/49/2010   Hyponatremia    S/P AKA (above knee amputation) unilateral, left ([place]) 06/28/2020   Gangrene of left foot ([place]) 06/28/2020   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 ([place]) 05/27/2020   Critical lower limb ischemia ([place]) 05/21/2020   Malnutrition ([place]) 05/19/2020   Plantar callus 12/24/2019   Cavitary pneumonia 09/14/2019   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   Unintentional weight loss 09/14/2019   Coronary artery calcification seen on CT scan 10/28/2018   Hilar adenopathy 04/10/2016   Lung nodule seen on imaging study 07/09/2015   Acute encephalopathy    Hypoglycemia due to insulin    Type 2 diabetes mellitus with other circulatory complications ([place]) 08/02/1973   Acute kidney injury superimposed on chronic kidney disease ([place]) 07/08/2015   Tobacco use disorder 07/08/2015   Diverticulosis of colon without hemorrhage    Left inguinal hernia 09/16/2013   Altered mental status 11/17/2012   Hypothermia 11/17/2012   Leukocytosis 11/17/2012   Chronic kidney disease, stage 3 unspecified ([place])    Post-traumatic stress disorder, chronic    GERD (gastroesophageal reflux disease)    Essential (primary) hypertension 09/10/2011   Anemia 09/10/2011   Diabetic neuropathy ([place]) 09/10/2011   DDD (degenerative disc disease), lumbar 09/10/2011   Psychosis ([place]) 09/10/2011   Diabetes mellitus type 2, uncontrolled 09/10/2011   Hyperlipemia 09/10/2011   Microalbuminuria 09/10/2011   Pamala Hurry D. Hartnett-Rands, MS, PT Per [place] [phone]  Jeannie Done, PT 01/30/2021, 12:17 PM  [facility] [address] [place], [place], [address] Phone: [phone]   Fax:  [phone]  Name: Allen Yu MRN: [id number] Date of Birth: 02-01-1945

## 2021-01-30 NOTE — Patient Instructions (Signed)
KNEE: Extension, Long Arc Quads - Sitting    Raise leg until knee is straight. _5-15__ reps per set, 1-2_ sets per day. Copyright  VHI. All rights reserved.   Short Arc [employer] a large can or rolled towel under left leg. Straighten leg. Hold 1-5_ seconds. Repeat 5-15__ times. Do _1-2__ sessions per day.  http://[email or web address].exer.us/298   Copyright  VHI. All rights reserved.

## 2021-02-02 ENCOUNTER — Encounter (HOSPITAL_COMMUNITY): Payer: TRICARE For Life (TFL)

## 2021-02-06 ENCOUNTER — Ambulatory Visit (HOSPITAL_COMMUNITY): Payer: Medicare Other

## 2021-02-06 ENCOUNTER — Other Ambulatory Visit: Payer: Self-pay

## 2021-02-06 DIAGNOSIS — R262 Difficulty in walking, not elsewhere classified: Secondary | ICD-10-CM | POA: Diagnosis not present

## 2021-02-06 DIAGNOSIS — Z79899 Other long term (current) drug therapy: Secondary | ICD-10-CM | POA: Diagnosis not present

## 2021-02-06 DIAGNOSIS — M6281 Muscle weakness (generalized): Secondary | ICD-10-CM

## 2021-02-06 DIAGNOSIS — I5022 Chronic systolic (congestive) heart failure: Secondary | ICD-10-CM | POA: Diagnosis not present

## 2021-02-06 DIAGNOSIS — N1831 Chronic kidney disease, stage 3a: Secondary | ICD-10-CM | POA: Diagnosis not present

## 2021-02-06 DIAGNOSIS — R2681 Unsteadiness on feet: Secondary | ICD-10-CM | POA: Diagnosis not present

## 2021-02-06 DIAGNOSIS — D630 Anemia in neoplastic disease: Secondary | ICD-10-CM | POA: Diagnosis not present

## 2021-02-06 NOTE — Therapy (Signed)
[place], [place], [address] Phone: [phone]   Fax:  [phone]  Physical Therapy Treatment  Patient Details  Name: Allen Yu MRN: [id number] Date of Birth: February 17, 1945 Referring Provider (PT): Nita Sickle, MD   Encounter Date: 02/06/2021   PT End of Session - 02/06/21 1354     Visit Number 3    Number of Visits 15    Date for PT Re-Evaluation 04/20/21    Authorization Type Medicare Part A and B; Tricare for Life    PT Start Time 6759    PT Stop Time 1430    PT Time Calculation (min) 45 min    Activity Tolerance Patient tolerated treatment well;Patient limited by fatigue    Behavior During Therapy [facility] for tasks assessed/performed             Past Medical History:  Diagnosis Date   Anemia    Arthritis    Carotid stenosis    Chronic back pain    Chronic HFrEF (heart failure with reduced ejection fraction) ([place])    a. 09/2020 Echo: EF 45%, mild LVH; b. 12/2020 Echo: EF 25-30%, glob HK, mod LVH, mildly reduced RV fxn, RVSP 65.23mmHg, mod BAE. Triv effusion. Mod MR/TR.   CKD (chronic kidney disease), stage III ([place])    Constipation    Dementia ([place])    Diabetes mellitus    Dilated cardiomyopathy ([place])    a. 09/2020 Echo: EF 45%; b. 12/2020 Echo: EF 25-30%.   GERD (gastroesophageal reflux disease)    History of kidney stones    Hypertension    Lung nodule    PAF (paroxysmal atrial fibrillation) (HCC)    Peripheral neuropathy    Peripheral vascular disease ([place])    a. 06/2020 s/p L AKA; b. 10/2020 s/p R SFA and above-knee popliteal PTA.   PTSD (post-traumatic stress disorder)    Schizophrenia ([place])    Tuberculosis    Treated    Past Surgical History:  Procedure Laterality Date   ABDOMINAL AORTOGRAM W/LOWER EXTREMITY N/A 05/20/2020   Procedure: ABDOMINAL AORTOGRAM W/LOWER EXTREMITY;  Surgeon: Elam Dutch, MD;  Location: [place] [facility];  Service: Cardiovascular;  Laterality: N/A;    ABDOMINAL AORTOGRAM W/LOWER EXTREMITY N/A 10/27/2020   Procedure: ABDOMINAL AORTOGRAM W/LOWER EXTREMITY;  Surgeon: Marty Heck, MD;  Location: [place] [facility];  Service: Cardiovascular;  Laterality: N/A;   AMPUTATION Left 06/28/2020   Procedure: AMPUTATION ABOVE KNEE LEFT;  Surgeon: Rosetta Posner, MD;  Location: [facility] OR;  Service: Vascular;  Laterality: Left;   [place], 2013   x2   BLADDER SURGERY     02   CHOLECYSTECTOMY     COLONOSCOPY     COLONOSCOPY N/A 12/07/2014   Procedure: COLONOSCOPY;  Surgeon: Daneil Dolin, MD;  [facility];  Service: Endoscopy;  Laterality: N/A;  10:30 Am   EYE SURGERY Bilateral    removed metal from eye   FEMORAL-TIBIAL BYPASS GRAFT Left 05/27/2020   Procedure: LEFT FEMORAL TO PERONEAL ARTERY BYPASS;  Surgeon: Rosetta Posner, MD;  Location: [place];  Service: Vascular;  Laterality: Left;   HERNIA REPAIR Right    INGUINAL HERNIA REPAIR Left 11/03/2013   Procedure: HERNIA REPAIR INGUINAL ADULT;  Surgeon: Gayland Curry, MD;  Location: [place];  Service: General;  Laterality: Left;   PERIPHERAL VASCULAR INTERVENTION  10/27/2020   Procedure: PERIPHERAL VASCULAR INTERVENTION;  Surgeon: Marty Heck, MD;  Location: [place] [facility];  Service: Cardiovascular;;   SHOULDER SURGERY     RIGHT SHOULDER    VIDEO BRONCHOSCOPY WITH ENDOBRONCHIAL NAVIGATION N/A 07/10/2019   Procedure: VIDEO BRONCHOSCOPY WITH ENDOBRONCHIAL NAVIGATION;  Surgeon: Melrose Nakayama, MD;  Location: [place];  Service: Thoracic;  Laterality: N/A;    There were no vitals filed for this visit.   Subjective Assessment - 02/06/21 1354     Subjective No new issues to report.    Patient is accompained by: Family member   PCA   Currently in Pain? No/denies    Pain Score 0-No pain                               [facility] Adult PT Treatment/Exercise - 02/06/21 0001       Bed Mobility   Bed Mobility Supine to Sit;Sit to Supine     Supine to Sit Minimal Assistance - Patient > 75%    Sit to Supine Supervision/Verbal cueing      Transfers   Transfers Sit to Stand;Lateral/Scoot Transfers;Stand Pivot Transfers    Sit to Stand 2: Max assist    Squat Pivot Transfers 3: Mod assist      Neuro Re-ed    Neuro Re-ed Details  Dynamic sitting EOM with emphasis on upright posture and performing throwing/catching tasks with various size/shape balls to improve reaction time and coordination for BUE as well as trunk strength to improve safety with EOB ADL participation. Overhead throw with ball 3x10.      Knee/Hip Exercises: Seated   Long Arc Quad Strengthening;Right;3 sets;10 reps    Long Arc Quad Weight 2 lbs.    Other Seated Knee/Hip Exercises seated rows BUE 3x10 blue t-band      Knee/Hip Exercises: Supine   Short Arc Quad Sets Strengthening;Right;3 sets;10 reps    Short Arc Quad Sets Limitations 2    Other Supine Knee/Hip Exercises chest press 5# bar and 5# ankle weight 3x10                       PT Short Term Goals - 01/26/21 1455       PT SHORT TERM GOAL #1   Title Demonstrate bed mobility with modified independence to reduce level of assistance    Baseline min-mod A    Time 6    Period Weeks    Status New    Target Date 03/09/21      PT SHORT TERM GOAL #2   Title Sit to stand transfer with min A with RW to improve standing tolerance    Baseline max A with HHA    Time 6    Period Weeks    Status New    Target Date 03/09/21      PT SHORT TERM GOAL #3   Title Squat-pivot transfer min A to improve safety with maneuvering in small spaces    Baseline max A    Time 6    Period Weeks    Status New    Target Date 03/09/21      PT SHORT TERM GOAL #4   Title Demo RLE 4/5 to prepare for gait/transfers    Baseline 3/5    Time 6    Period Weeks    Status New    Target Date 03/09/21  PT Long Term Goals - 01/26/21 1457       PT LONG TERM GOAL #1   Title Ambulate x 25 ft  with RW with/without prosthetic to improve gait tolerance    Baseline unable    Time 12    Period Weeks    Status New    Target Date 04/20/21      PT LONG TERM GOAL #2   Title Stand-pivot transfer with supervision to increase functional independence    Baseline max A    Time 12    Period Weeks    Status New    Target Date 04/20/21                   Plan - 02/06/21 1429     Clinical Impression Statement Tolerating PRE without adverse effects and demonstrating improved LE strength with performance of resistance using 2 lbs for RLE. Continued sessions indicated to imrpove general strength and activity tolerance to prepare for demands of prosthetic training    Personal Factors and Comorbidities Age;Comorbidity 3+;Time since onset of injury/illness/exacerbation;Fitness    Comorbidities dementia, CKD, DM    Examination-Activity Limitations Bed Mobility;Lift;Toileting;Stand;Reach Overhead;Locomotion Level;Transfers    Stability/Clinical Decision Making Evolving/Moderate complexity    Rehab Potential Fair    PT Frequency 1x / week    PT Duration 12 weeks    PT Treatment/Interventions ADLs/Self Care Home Management;DME Instruction;Gait training;Functional mobility training;Therapeutic activities;Therapeutic exercise;Balance training;Prosthetic Training;Patient/family education;Neuromuscular re-education;Wheelchair mobility training;Manual techniques;Taping    PT Next Visit Plan assess for current HEP    PT Home Exercise Plan left hip flexor stretch (Thomas test position), hip extension isometric, RLE SLR; 19 - SAQ, LAQ    Consulted and Agree with Plan of Care Patient;Family member/caregiver    Family Member Consulted spouse and PCA             Patient will benefit from skilled therapeutic intervention in order to improve the following deficits and impairments:  Decreased activity tolerance, Decreased balance, Decreased mobility, Decreased knowledge of use of DME, Decreased  endurance, Decreased range of motion, Decreased strength, Difficulty walking, Impaired flexibility, Decreased safety awareness  Visit Diagnosis: Muscle weakness (generalized)  Unsteadiness on feet  Difficulty in walking, not elsewhere classified     Problem List Patient Active Problem List   Diagnosis Date Noted   Acute respiratory failure with hypoxia and hypercarbia (HCC) 01/23/2021   CKD (chronic kidney disease) stage 3, GFR 30-59 ml/min (HCC) 01/23/2021   Acute on chronic systolic CHF (congestive heart failure) ([place]) 01/23/2021   Acute respiratory failure with hypoxia ([place]) 01/13/2021   Encounter for fitting and adjustment of hearing aid 12/21/2020   Other specified problems related to psychosocial circumstances 11/21/2020   Sensorineural hearing loss, bilateral 11/21/2020   Tobacco dependence 10/06/2020   Benign prostatic hyperplasia with urinary obstruction 10/06/2020   Above knee amputation of left lower extremity ([place]) 10/06/2020   Other specified counseling 10/06/2020   Severe sepsis ([place]) 09/24/2020   Schizophrenia, unspecified ([place]) 09/24/2020   Unspecified dementia, unspecified severity, without behavioral disturbance, psychotic disturbance, mood disturbance, and anxiety ([place]) 09/24/2020   Lung nodule 09/24/2020   Indwelling Foley catheter present 09/24/2020   Peripheral vascular disease ([place]) 09/24/2020   Mass of testicle 09/24/2020   Problem related to unspecified psychosocial circumstances 08/01/2020   Hypermetropia 07/22/2020   Unspecified atrial fibrillation ([place]) 07/22/2020   Klebsiella sepsis ([place]) 07/22/2020   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   SIRS (systemic inflammatory response syndrome) ([place]) 62/70/3500   Acute metabolic  encephalopathy 07/21/2020   Hyponatremia    S/P AKA (above knee amputation) unilateral, left ([place]) 06/28/2020   Gangrene of left foot ([place]) 06/28/2020   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 ([place]) 05/27/2020    Critical lower limb ischemia ([place]) 05/21/2020   Malnutrition ([place]) 05/19/2020   Plantar callus 12/24/2019   Cavitary pneumonia 09/14/2019   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   Unintentional weight loss 09/14/2019   Coronary artery calcification seen on CT scan 10/28/2018   Hilar adenopathy 04/10/2016   Lung nodule seen on imaging study 07/09/2015   Acute encephalopathy    Hypoglycemia due to insulin    Type 2 diabetes mellitus with other circulatory complications ([place]) 62/86/3817   Acute kidney injury superimposed on chronic kidney disease ([place]) 07/08/2015   Tobacco use disorder 07/08/2015   Diverticulosis of colon without hemorrhage    Left inguinal hernia 09/16/2013   Altered mental status 11/17/2012   Hypothermia 11/17/2012   Leukocytosis 11/17/2012   Chronic kidney disease, stage 3 unspecified ([place])    Post-traumatic stress disorder, chronic    GERD (gastroesophageal reflux disease)    Essential (primary) hypertension 09/10/2011   Anemia 09/10/2011   Diabetic neuropathy ([place]) 09/10/2011   DDD (degenerative disc disease), lumbar 09/10/2011   Psychosis ([place]) 09/10/2011   Diabetes mellitus type 2, uncontrolled 09/10/2011   Hyperlipemia 09/10/2011   Microalbuminuria 09/10/2011    Toniann Fail, PT 02/06/2021, 2:30 PM  [facility] [address] [place], [place], [address] Phone: [phone]   Fax:  [phone]  Name: Allen Yu MRN: [id number] Date of Birth: 02-Nov-1945

## 2021-02-08 ENCOUNTER — Encounter (HOSPITAL_COMMUNITY): Payer: TRICARE For Life (TFL)

## 2021-02-10 DIAGNOSIS — E875 Hyperkalemia: Secondary | ICD-10-CM | POA: Diagnosis not present

## 2021-02-10 DIAGNOSIS — E1122 Type 2 diabetes mellitus with diabetic chronic kidney disease: Secondary | ICD-10-CM | POA: Diagnosis not present

## 2021-02-10 DIAGNOSIS — L239 Allergic contact dermatitis, unspecified cause: Secondary | ICD-10-CM | POA: Diagnosis not present

## 2021-02-10 DIAGNOSIS — I5040 Unspecified combined systolic (congestive) and diastolic (congestive) heart failure: Secondary | ICD-10-CM | POA: Diagnosis not present

## 2021-02-13 ENCOUNTER — Ambulatory Visit (HOSPITAL_COMMUNITY): Payer: Medicare Other

## 2021-02-13 ENCOUNTER — Other Ambulatory Visit: Payer: Self-pay

## 2021-02-13 DIAGNOSIS — M6281 Muscle weakness (generalized): Secondary | ICD-10-CM

## 2021-02-13 DIAGNOSIS — R2681 Unsteadiness on feet: Secondary | ICD-10-CM

## 2021-02-13 DIAGNOSIS — R262 Difficulty in walking, not elsewhere classified: Secondary | ICD-10-CM | POA: Diagnosis not present

## 2021-02-13 NOTE — Therapy (Signed)
[place], [place], [address] Phone: [phone]   Fax:  [phone]  Physical Therapy Treatment  Patient Details  Name: Allen Yu MRN: [id number] Date of Birth: 10-20-1945 Referring Provider (PT): Nita Sickle, MD   Encounter Date: 02/13/2021   PT End of Session - 02/13/21 1341     Visit Number 4    Number of Visits 15    Date for PT Re-Evaluation 04/20/21    Authorization Type Medicare Part A and B; T[employer] for Life    PT Start Time 9163    PT Stop Time 1430    PT Time Calculation (min) 45 min    Activity Tolerance Patient tolerated treatment well;Patient limited by fatigue    Behavior During Therapy [facility] for tasks assessed/performed             Past Medical History:  Diagnosis Date   Anemia    Arthritis    Carotid stenosis    Chronic back pain    Chronic HFrEF (heart failure with reduced ejection fraction) ([place])    a. 09/2020 Echo: EF 45%, mild LVH; b. 12/2020 Echo: EF 25-30%, glob HK, mod LVH, mildly reduced RV fxn, RVSP 65.29mmHg, mod BAE. Triv effusion. Mod MR/TR.   CKD (chronic kidney disease), stage III ([place])    Constipation    Dementia ([place])    Diabetes mellitus    Dilated cardiomyopathy ([place])    a. 09/2020 Echo: EF 45%; b. 12/2020 Echo: EF 25-30%.   GERD (gastroesophageal reflux disease)    History of kidney stones    Hypertension    Lung nodule    PAF (paroxysmal atrial fibrillation) (HCC)    Peripheral neuropathy    Peripheral vascular disease ([place])    a. 06/2020 s/p L AKA; b. 10/2020 s/p R SFA and above-knee popliteal PTA.   PTSD (post-traumatic stress disorder)    Schizophrenia ([place])    Tuberculosis    Treated    Past Surgical History:  Procedure Laterality Date   ABDOMINAL AORTOGRAM W/LOWER EXTREMITY N/A 05/20/2020   Procedure: ABDOMINAL AORTOGRAM W/LOWER EXTREMITY;  Surgeon: Elam Dutch, MD;  Location: [place] [facility];  Service: Cardiovascular;  Laterality: N/A;    ABDOMINAL AORTOGRAM W/LOWER EXTREMITY N/A 10/27/2020   Procedure: ABDOMINAL AORTOGRAM W/LOWER EXTREMITY;  Surgeon: Marty Heck, MD;  Location: [place] [facility];  Service: Cardiovascular;  Laterality: N/A;   AMPUTATION Left 06/28/2020   Procedure: AMPUTATION ABOVE KNEE LEFT;  Surgeon: Rosetta Posner, MD;  Location: [facility] OR;  Service: Vascular;  Laterality: Left;   [place], 2013   x2   BLADDER SURGERY     02   CHOLECYSTECTOMY     COLONOSCOPY     COLONOSCOPY N/A 12/07/2014   Procedure: COLONOSCOPY;  Surgeon: Daneil Dolin, MD;  [facility];  Service: Endoscopy;  Laterality: N/A;  10:30 Am   EYE SURGERY Bilateral    removed metal from eye   FEMORAL-TIBIAL BYPASS GRAFT Left 05/27/2020   Procedure: LEFT FEMORAL TO PERONEAL ARTERY BYPASS;  Surgeon: Rosetta Posner, MD;  Location: [place];  Service: Vascular;  Laterality: Left;   HERNIA REPAIR Right    INGUINAL HERNIA REPAIR Left 11/03/2013   Procedure: HERNIA REPAIR INGUINAL ADULT;  Surgeon: Gayland Curry, MD;  Location: [place];  Service: General;  Laterality: Left;   PERIPHERAL VASCULAR INTERVENTION  10/27/2020   Procedure: PERIPHERAL VASCULAR INTERVENTION;  Surgeon: Marty Heck, MD;  Location: [place] [facility];  Service: Cardiovascular;;   SHOULDER SURGERY     RIGHT SHOULDER    VIDEO BRONCHOSCOPY WITH ENDOBRONCHIAL NAVIGATION N/A 07/10/2019   Procedure: VIDEO BRONCHOSCOPY WITH ENDOBRONCHIAL NAVIGATION;  Surgeon: Melrose Nakayama, MD;  Location: [place];  Service: Thoracic;  Laterality: N/A;    There were no vitals filed for this visit.   Subjective Assessment - 02/13/21 1407     Subjective Been completing LE HEP for leg strength with assist from family    Patient is accompained by: Family member   PCA   Currently in Pain? No/denies    Pain Score 0-No pain                [facility] PT Assessment - 02/13/21 0001       Assessment   Medical Diagnosis Gen. weakness' left AKA                            [facility] Adult PT Treatment/Exercise - 02/13/21 0001       Bed Mobility   Bed Mobility Supine to Sit;Sit to Supine    Supine to Sit Contact Guard/Touching assist    Sit to Supine Contact Guard/Touching assist      Transfers   Transfers Squat Pivot Transfers    Squat Pivot Transfers 4: Min guard    Lateral/Scoot Transfers 4: Min guard      Neuro Re-ed    Neuro Re-ed Details  Dynamic sitting EOM performing various UE movements with 2kg med ball: overhead press, chest pass, trunk twists, chest press 2x10 reps to improve core strength and UE power for transfers. Stick wrestling against therapist 3x30 sec to improve UE strength and core strength for posture and EOB activity tolerance.      Knee/Hip Exercises: Seated   Long Arc Quad Strengthening;Right;3 sets;10 reps    Other Seated Knee/Hip Exercises seated rows BUE 3x10 blue t-band      Knee/Hip Exercises: Supine   Short Arc Quad Sets Strengthening;Right;3 sets;10 reps    Short Arc Quad Sets Limitations 3    Other Supine Knee/Hip Exercises chest press 5# bar and 5# ankle weight 3x10      Knee/Hip Exercises: Sidelying   Hip ABduction Strengthening;Left;3 sets;10 reps    Hip ABduction Limitations manual resistance for hip alignment    Other Sidelying Knee/Hip Exercises left hip extension 3x10 manual resistance                       PT Short Term Goals - 02/13/21 1450       PT SHORT TERM GOAL #1   Title Demonstrate bed mobility with modified independence to reduce level of assistance    Baseline min-mod A    Time 6    Period Weeks    Status On-going    Target Date 03/09/21      PT SHORT TERM GOAL #2   Title Sit to stand transfer with min A with RW to improve standing tolerance    Baseline max A with HHA    Time 6    Period Weeks    Status On-going    Target Date 03/09/21      PT SHORT TERM GOAL #3   Title Squat-pivot transfer min A to improve safety with maneuvering in small  spaces    Baseline max A    Time 6  Period Weeks    Status On-going    Target Date 03/09/21      PT SHORT TERM GOAL #4   Title Demo RLE 4/5 to prepare for gait/transfers    Baseline 3/5    Time 6    Period Weeks    Status On-going    Target Date 03/09/21               PT Long Term Goals - 01/26/21 1457       PT LONG TERM GOAL #1   Title Ambulate x 25 ft with RW with/without prosthetic to improve gait tolerance    Baseline unable    Time 12    Period Weeks    Status New    Target Date 04/20/21      PT LONG TERM GOAL #2   Title Stand-pivot transfer with supervision to increase functional independence    Baseline max A    Time 12    Period Weeks    Status New    Target Date 04/20/21                   Plan - 02/13/21 1443     Clinical Impression Statement Improving activity tolerance evident with increased resistance and repetition. Improving functional mobility requiring less physical assistance with bed mobility and transfers.  Able to achieve left hip extension to neutral with physical overpressure. Continued sessions indicated to progress strength and transfers to prepare for gait training    Personal Factors and Comorbidities Age;Comorbidity 3+;Time since onset of injury/illness/exacerbation;Fitness    Comorbidities dementia, CKD, DM    Examination-Activity Limitations Bed Mobility;Lift;Toileting;Stand;Reach Overhead;Locomotion Level;Transfers    Stability/Clinical Decision Making Evolving/Moderate complexity    Rehab Potential Fair    PT Frequency 1x / week    PT Duration 12 weeks    PT Treatment/Interventions ADLs/Self Care Home Management;DME Instruction;Gait training;Functional mobility training;Therapeutic activities;Therapeutic exercise;Balance training;Prosthetic Training;Patient/family education;Neuromuscular re-education;Wheelchair mobility training;Manual techniques;Taping    PT Next Visit Plan assess for current HEP    PT Home Exercise  Plan left hip flexor stretch (Thomas test position), hip extension isometric, RLE SLR; 19 - SAQ, LAQ    Consulted and Agree with Plan of Care Patient;Family member/caregiver    Family Member Consulted spouse and PCA             Patient will benefit from skilled therapeutic intervention in order to improve the following deficits and impairments:  Decreased activity tolerance, Decreased balance, Decreased mobility, Decreased knowledge of use of DME, Decreased endurance, Decreased range of motion, Decreased strength, Difficulty walking, Impaired flexibility, Decreased safety awareness  Visit Diagnosis: Muscle weakness (generalized)  Unsteadiness on feet  Difficulty in walking, not elsewhere classified     Problem List Patient Active Problem List   Diagnosis Date Noted   Acute respiratory failure with hypoxia and hypercarbia (HCC) 01/23/2021   CKD (chronic kidney disease) stage 3, GFR 30-59 ml/min (HCC) 01/23/2021   Acute on chronic systolic CHF (congestive heart failure) ([place]) 01/23/2021   Acute respiratory failure with hypoxia ([place]) 01/13/2021   Encounter for fitting and adjustment of hearing aid 12/21/2020   Other specified problems related to psychosocial circumstances 11/21/2020   Sensorineural hearing loss, bilateral 11/21/2020   Tobacco dependence 10/06/2020   Benign prostatic hyperplasia with urinary obstruction 10/06/2020   Above knee amputation of left lower extremity ([place]) 10/06/2020   Other specified counseling 10/06/2020   Severe sepsis ([place]) 09/24/2020   Schizophrenia, unspecified ([place]) 09/24/2020   Unspecified dementia,  unspecified severity, without behavioral disturbance, psychotic disturbance, mood disturbance, and anxiety ([place]) 09/24/2020   Lung nodule 09/24/2020   Indwelling Foley catheter present 09/24/2020   Peripheral vascular disease ([place]) 09/24/2020   Mass of testicle 09/24/2020   Problem related to unspecified psychosocial circumstances 08/01/2020    Hypermetropia 07/22/2020   Unspecified atrial fibrillation ([place]) 07/22/2020   Klebsiella sepsis ([place]) 07/22/2020   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   SIRS (systemic inflammatory response syndrome) ([place]) 39/76/7341   Acute metabolic encephalopathy 93/79/0240   Hyponatremia    S/P AKA (above knee amputation) unilateral, left ([place]) 06/28/2020   Gangrene of left foot ([place]) 06/28/2020   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 ([place]) 05/27/2020   Critical lower limb ischemia ([place]) 05/21/2020   Malnutrition ([place]) 05/19/2020   Plantar callus 12/24/2019   Cavitary pneumonia 09/14/2019   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   Unintentional weight loss 09/14/2019   Coronary artery calcification seen on CT scan 10/28/2018   Hilar adenopathy 04/10/2016   Lung nodule seen on imaging study 07/09/2015   Acute encephalopathy    Hypoglycemia due to insulin    Type 2 diabetes mellitus with other circulatory complications ([place]) 97/35/3299   Acute kidney injury superimposed on chronic kidney disease ([place]) 07/08/2015   Tobacco use disorder 07/08/2015   Diverticulosis of colon without hemorrhage    Left inguinal hernia 09/16/2013   Altered mental status 11/17/2012   Hypothermia 11/17/2012   Leukocytosis 11/17/2012   Chronic kidney disease, stage 3 unspecified ([place])    Post-traumatic stress disorder, chronic    GERD (gastroesophageal reflux disease)    Essential (primary) hypertension 09/10/2011   Anemia 09/10/2011   Diabetic neuropathy ([place]) 09/10/2011   DDD (degenerative disc disease), lumbar 09/10/2011   Psychosis ([place]) 09/10/2011   Diabetes mellitus type 2, uncontrolled 09/10/2011   Hyperlipemia 09/10/2011   Microalbuminuria 09/10/2011    Toniann Fail, PT 02/13/2021, 2:52 PM  [facility] [address] [place], [place], [address] Phone: [phone]   Fax:  [phone]  Name: Allen Yu MRN: [id number] Date of Birth:  09/09/1945

## 2021-02-15 ENCOUNTER — Encounter (HOSPITAL_COMMUNITY): Payer: TRICARE For Life (TFL)

## 2021-02-20 ENCOUNTER — Ambulatory Visit (HOSPITAL_COMMUNITY): Payer: Medicare Other | Admitting: Physical Therapy

## 2021-02-20 ENCOUNTER — Other Ambulatory Visit: Payer: Self-pay

## 2021-02-20 DIAGNOSIS — R262 Difficulty in walking, not elsewhere classified: Secondary | ICD-10-CM

## 2021-02-20 DIAGNOSIS — M6281 Muscle weakness (generalized): Secondary | ICD-10-CM

## 2021-02-20 DIAGNOSIS — R2681 Unsteadiness on feet: Secondary | ICD-10-CM

## 2021-02-20 NOTE — Patient Instructions (Signed)
Access Code: [email or web address] URL: [email or web address] Date: 02/20/2021 Prepared by: Josue Hector  Exercises Seated Shoulder Row with Anchored Resistance - 2-3 x daily - 7 x weekly - 3 sets - 10 reps

## 2021-02-20 NOTE — Therapy (Signed)
[place], [place], [address] Phone: [phone]   Fax:  [phone]  Physical Therapy Treatment  Patient Details  Name: Allen Yu MRN: [id number] Date of Birth: 07-21-1945 Referring Provider (PT): Nita Sickle, MD   Encounter Date: 02/20/2021   PT End of Session - 02/20/21 1307     Visit Number 5    Number of Visits 15    Date for PT Re-Evaluation 04/20/21    Authorization Type Medicare Part A and B; Tricare for Life    PT Start Time 1303    PT Stop Time 1344    PT Time Calculation (min) 41 min    Activity Tolerance Patient tolerated treatment well;Patient limited by fatigue    Behavior During Therapy [facility] for tasks assessed/performed             Past Medical History:  Diagnosis Date   Anemia    Arthritis    Carotid stenosis    Chronic back pain    Chronic HFrEF (heart failure with reduced ejection fraction) ([place])    a. 09/2020 Echo: EF 45%, mild LVH; b. 12/2020 Echo: EF 25-30%, glob HK, mod LVH, mildly reduced RV fxn, RVSP 65.59mmHg, mod BAE. Triv effusion. Mod MR/TR.   CKD (chronic kidney disease), stage III ([place])    Constipation    Dementia ([place])    Diabetes mellitus    Dilated cardiomyopathy ([place])    a. 09/2020 Echo: EF 45%; b. 12/2020 Echo: EF 25-30%.   GERD (gastroesophageal reflux disease)    History of kidney stones    Hypertension    Lung nodule    PAF (paroxysmal atrial fibrillation) (HCC)    Peripheral neuropathy    Peripheral vascular disease ([place])    a. 06/2020 s/p L AKA; b. 10/2020 s/p R SFA and above-knee popliteal PTA.   PTSD (post-traumatic stress disorder)    Schizophrenia ([place])    Tuberculosis    Treated    Past Surgical History:  Procedure Laterality Date   ABDOMINAL AORTOGRAM W/LOWER EXTREMITY N/A 05/20/2020   Procedure: ABDOMINAL AORTOGRAM W/LOWER EXTREMITY;  Surgeon: Elam Dutch, MD;  Location: [place] [facility];  Service: Cardiovascular;  Laterality: N/A;    ABDOMINAL AORTOGRAM W/LOWER EXTREMITY N/A 10/27/2020   Procedure: ABDOMINAL AORTOGRAM W/LOWER EXTREMITY;  Surgeon: Marty Heck, MD;  Location: [place] [facility];  Service: Cardiovascular;  Laterality: N/A;   AMPUTATION Left 06/28/2020   Procedure: AMPUTATION ABOVE KNEE LEFT;  Surgeon: Rosetta Posner, MD;  Location: [facility] OR;  Service: Vascular;  Laterality: Left;   [place], 2013   x2   BLADDER SURGERY     02   CHOLECYSTECTOMY     COLONOSCOPY     COLONOSCOPY N/A 12/07/2014   Procedure: COLONOSCOPY;  Surgeon: Daneil Dolin, MD;  [facility];  Service: Endoscopy;  Laterality: N/A;  10:30 Am   EYE SURGERY Bilateral    removed metal from eye   FEMORAL-TIBIAL BYPASS GRAFT Left 05/27/2020   Procedure: LEFT FEMORAL TO PERONEAL ARTERY BYPASS;  Surgeon: Rosetta Posner, MD;  Location: [facility];  Service: Vascular;  Laterality: Left;   HERNIA REPAIR Right    INGUINAL HERNIA REPAIR Left 11/03/2013   Procedure: HERNIA REPAIR INGUINAL ADULT;  Surgeon: Gayland Curry, MD;  Location: [place];  Service: General;  Laterality: Left;   PERIPHERAL VASCULAR INTERVENTION  10/27/2020   Procedure: PERIPHERAL VASCULAR INTERVENTION;  Surgeon: Marty Heck, MD;  Location: [facility] [place] [facility];  Service: Cardiovascular;;   SHOULDER SURGERY     RIGHT SHOULDER    VIDEO BRONCHOSCOPY WITH ENDOBRONCHIAL NAVIGATION N/A 07/10/2019   Procedure: VIDEO BRONCHOSCOPY WITH ENDOBRONCHIAL NAVIGATION;  Surgeon: Melrose Nakayama, MD;  Location: [facility];  Service: Thoracic;  Laterality: N/A;    There were no vitals filed for this visit.   Subjective Assessment - 02/20/21 1307     Subjective Patient with no new reports. His assistant says he is getting stronger.    Patient is accompained by: Family member   PCA   Currently in Pain? Yes    Pain Score 10-Worst pain ever    Pain Location --   all over                              [facility] Adult PT Treatment/Exercise -  02/20/21 0001       Knee/Hip Exercises: Seated   Other Seated Knee/Hip Exercises RTB rows x30 seated at EOB for core activation      Knee/Hip Exercises: Supine   Short Arc Quad Sets Right;3 sets;10 reps    Short Arc Quad Sets Limitations 2    Straight Leg Raises Right;3 sets;10 reps    Other Supine Knee/Hip Exercises left hip extension isometric on towel roll 15 x 3"      Knee/Hip Exercises: Sidelying   Hip ABduction Right;3 sets;10 reps    Clams RLE 3 x 10                       PT Short Term Goals - 02/13/21 1450       PT SHORT TERM GOAL #1   Title Demonstrate bed mobility with modified independence to reduce level of assistance    Baseline min-mod A    Time 6    Period Weeks    Status On-going    Target Date 03/09/21      PT SHORT TERM GOAL #2   Title Sit to stand transfer with min A with RW to improve standing tolerance    Baseline max A with HHA    Time 6    Period Weeks    Status On-going    Target Date 03/09/21      PT SHORT TERM GOAL #3   Title Squat-pivot transfer min A to improve safety with maneuvering in small spaces    Baseline max A    Time 6    Period Weeks    Status On-going    Target Date 03/09/21      PT SHORT TERM GOAL #4   Title Demo RLE 4/5 to prepare for gait/transfers    Baseline 3/5    Time 6    Period Weeks    Status On-going    Target Date 03/09/21               PT Long Term Goals - 01/26/21 1457       PT LONG TERM GOAL #1   Title Ambulate x 25 ft with RW with/without prosthetic to improve gait tolerance    Baseline unable    Time 12    Period Weeks    Status New    Target Date 04/20/21      PT LONG TERM GOAL #2   Title Stand-pivot transfer with supervision to increase functional independence    Baseline max A  Time 12    Period Weeks    Status New    Target Date 04/20/21                   Plan - 02/20/21 1341     Clinical Impression Statement Patient with fair tolerance. Demos slight  fatigue with increased reps with LE strengthening activity. Intermittent cues needed for completion of task. Patient demos ongoing weakness in core with functional transfers which limits ability to perform bed mobility. Patient will continue to benefit from skilled therapy services to reduce deficits and improve functional ability.    Personal Factors and Comorbidities Age;Comorbidity 3+;Time since onset of injury/illness/exacerbation;Fitness    Comorbidities dementia, CKD, DM    Examination-Activity Limitations Bed Mobility;Lift;Toileting;Stand;Reach Overhead;Locomotion Level;Transfers    Stability/Clinical Decision Making Evolving/Moderate complexity    Rehab Potential Fair    PT Frequency 1x / week    PT Duration 12 weeks    PT Treatment/Interventions ADLs/Self Care Home Management;DME Instruction;Gait training;Functional mobility training;Therapeutic activities;Therapeutic exercise;Balance training;Prosthetic Training;Patient/family education;Neuromuscular re-education;Wheelchair mobility training;Manual techniques;Taping    PT Next Visit Plan assess for current HEP    PT Home Exercise Plan left hip flexor stretch (Thomas test position), hip extension isometric, RLE SLR; 19 - SAQ, LAQ 1/30 band rows seated    Consulted and Agree with Plan of Care Patient;Family member/caregiver    Family Member Consulted spouse and PCA             Patient will benefit from skilled therapeutic intervention in order to improve the following deficits and impairments:  Decreased activity tolerance, Decreased balance, Decreased mobility, Decreased knowledge of use of DME, Decreased endurance, Decreased range of motion, Decreased strength, Difficulty walking, Impaired flexibility, Decreased safety awareness  Visit Diagnosis: Muscle weakness (generalized)  Difficulty in walking, not elsewhere classified  Unsteadiness on feet     Problem List Patient Active Problem List   Diagnosis Date Noted   Acute  respiratory failure with hypoxia and hypercarbia (HCC) 01/23/2021   CKD (chronic kidney disease) stage 3, GFR 30-59 ml/min (HCC) 01/23/2021   Acute on chronic systolic CHF (congestive heart failure) ([place]) 01/23/2021   Acute respiratory failure with hypoxia ([place]) 01/13/2021   Encounter for fitting and adjustment of hearing aid 12/21/2020   Other specified problems related to psychosocial circumstances 11/21/2020   Sensorineural hearing loss, bilateral 11/21/2020   Tobacco dependence 10/06/2020   Benign prostatic hyperplasia with urinary obstruction 10/06/2020   Above knee amputation of left lower extremity ([place]) 10/06/2020   Other specified counseling 10/06/2020   Severe sepsis ([place]) 09/24/2020   Schizophrenia, unspecified ([place]) 09/24/2020   Unspecified dementia, unspecified severity, without behavioral disturbance, psychotic disturbance, mood disturbance, and anxiety ([place]) 09/24/2020   Lung nodule 09/24/2020   Indwelling Foley catheter present 09/24/2020   Peripheral vascular disease ([place]) 09/24/2020   Mass of testicle 09/24/2020   Problem related to unspecified psychosocial circumstances 08/01/2020   Hypermetropia 07/22/2020   Unspecified atrial fibrillation ([place]) 07/22/2020   Klebsiella sepsis ([place]) 07/22/2020   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   SIRS (systemic inflammatory response syndrome) ([place]) 74/25/9563   Acute metabolic encephalopathy 87/56/4332   Hyponatremia    S/P AKA (above knee amputation) unilateral, left ([place]) 06/28/2020   Gangrene of left foot ([place]) 06/28/2020   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 ([place]) 05/27/2020   Critical lower limb ischemia ([place]) 05/21/2020   Malnutrition ([place]) 05/19/2020   Plantar callus 12/24/2019   Cavitary pneumonia 09/14/2019   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  Unintentional weight loss 09/14/2019   Coronary artery calcification seen on CT scan 10/28/2018   Hilar adenopathy 04/10/2016   Lung nodule seen  on imaging study 07/09/2015   Acute encephalopathy    Hypoglycemia due to insulin    Type 2 diabetes mellitus with other circulatory complications ([place]) 15/17/6160   Acute kidney injury superimposed on chronic kidney disease ([place]) 07/08/2015   Tobacco use disorder 07/08/2015   Diverticulosis of colon without hemorrhage    Left inguinal hernia 09/16/2013   Altered mental status 11/17/2012   Hypothermia 11/17/2012   Leukocytosis 11/17/2012   Chronic kidney disease, stage 3 unspecified ([place])    Post-traumatic stress disorder, chronic    GERD (gastroesophageal reflux disease)    Essential (primary) hypertension 09/10/2011   Anemia 09/10/2011   Diabetic neuropathy ([place]) 09/10/2011   DDD (degenerative disc disease), lumbar 09/10/2011   Psychosis ([place]) 09/10/2011   Diabetes mellitus type 2, uncontrolled 09/10/2011   Hyperlipemia 09/10/2011   Microalbuminuria 09/10/2011   1:45 PM, 02/20/21 Josue Hector PT DPT  Physical Therapist with [facility]  [phone] [place] [address] [place], [place], [address] Phone: [phone]   Fax:  [phone]  Name: Allen Yu MRN: [id number] Date of Birth: 20-Mar-1945

## 2021-02-24 ENCOUNTER — Other Ambulatory Visit: Payer: Self-pay

## 2021-02-24 ENCOUNTER — Encounter: Payer: Self-pay | Admitting: *Deleted

## 2021-02-24 ENCOUNTER — Ambulatory Visit (INDEPENDENT_AMBULATORY_CARE_PROVIDER_SITE_OTHER): Payer: Medicare Other | Admitting: Student

## 2021-02-24 ENCOUNTER — Encounter: Payer: Self-pay | Admitting: Student

## 2021-02-24 VITALS — BP 115/62 | HR 63 | Ht 72.0 in | Wt 120.0 lb

## 2021-02-24 DIAGNOSIS — N1831 Chronic kidney disease, stage 3a: Secondary | ICD-10-CM | POA: Diagnosis not present

## 2021-02-24 DIAGNOSIS — I480 Paroxysmal atrial fibrillation: Secondary | ICD-10-CM | POA: Diagnosis not present

## 2021-02-24 DIAGNOSIS — I5020 Unspecified systolic (congestive) heart failure: Secondary | ICD-10-CM

## 2021-02-24 DIAGNOSIS — I10 Essential (primary) hypertension: Secondary | ICD-10-CM | POA: Diagnosis not present

## 2021-02-24 DIAGNOSIS — I739 Peripheral vascular disease, unspecified: Secondary | ICD-10-CM | POA: Diagnosis not present

## 2021-02-24 NOTE — Progress Notes (Signed)
Cardiology Office Note    Date:  02/24/2021   ID:  Allen Yu, DOB [birth year], MRN [id number]  PCP:  [name], MD  Cardiologist: Rozann Lesches, MD    Chief Complaint  Patient presents with   Hospitalization Follow-up    History of Present Illness:    Allen Yu is a 76 y.o. 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  He was admitted to [facility] from 12/23 - 01/19/2021 for acute respiratory failure in the setting of a CHF exacerbation. Repeat echocardiogram did show that his EF was reduced at 25 to 30% with global hypokinesis and also noted to have moderate MR and TR.  Was reviewed with the patient and his wife and she overall preferred a conservative approach given his multiple medical issues. He responded well to IV Lasix with an overall output greater than 6 L and was transitioned to Torsemide 20 mg daily at discharge.  He was again admitted from 1/2 - 01/25/2021 for acute hypoxic respiratory failure in the setting of recurrent CHF exacerbation and was discharged on Torsemide 30 mg daily. Weight was 109 lbs at the time of discharge and creatinine was stable at 1.39.  In talking with the patient today, most history is provided by his wife and caregiver. They report he has overall been doing well at home and has been going to therapy at least once a week. His breathing has been stable and he denies any recurrent dyspnea, orthopnea, PND or pitting edema. No reported chest pain or palpitations. He has reduced his fluid intake from almost 2 gallons daily to 64 ounces daily.   Past Medical History:  Diagnosis Date   Anemia    Arthritis    Carotid stenosis    Chronic back pain    Chronic HFrEF (heart failure  with reduced ejection fraction) ([place])    a. 09/2020 Echo: EF 45%, mild LVH; b. 12/2020 Echo: EF 25-30%, glob HK, mod LVH, mildly reduced RV fxn, RVSP 65.54mmHg, mod BAE. Triv effusion. Mod MR/TR.   CKD (chronic kidney disease), stage III ([place])    Constipation    Dementia ([place])    Diabetes mellitus    Dilated cardiomyopathy ([place])    a. 09/2020 Echo: EF 45%; b. 12/2020 Echo: EF 25-30%.   GERD (gastroesophageal reflux disease)    History of kidney stones    Hypertension    Lung nodule    PAF (paroxysmal atrial fibrillation) (HCC)    Peripheral neuropathy    Peripheral vascular disease ([place])    a. 06/2020 s/p L AKA; b. 10/2020 s/p R SFA and above-knee popliteal PTA.   PTSD (post-traumatic stress disorder)    Schizophrenia ([place])    Tuberculosis    Treated    Past Surgical History:  Procedure Laterality Date   ABDOMINAL AORTOGRAM W/LOWER EXTREMITY N/A 05/20/2020   Procedure: ABDOMINAL AORTOGRAM W/LOWER EXTREMITY;  Surgeon: Elam Dutch, MD;  Location: [place] [facility];  Service: Cardiovascular;  Laterality: N/A;   ABDOMINAL AORTOGRAM W/LOWER EXTREMITY N/A 10/27/2020   Procedure: ABDOMINAL AORTOGRAM W/LOWER EXTREMITY;  Surgeon: Marty Heck, MD;  Location: [place] [facility];  Service: Cardiovascular;  Laterality: N/A;   AMPUTATION Left 06/28/2020   Procedure: AMPUTATION ABOVE KNEE LEFT;  Surgeon: Rosetta Posner, MD;  Location: [facility];  Service: Vascular;  Laterality: Left;   [place], 2013   x2   BLADDER SURGERY     02   CHOLECYSTECTOMY     COLONOSCOPY     COLONOSCOPY N/A 12/07/2014   Procedure: COLONOSCOPY;  Surgeon: Daneil Dolin, MD;  [facility];  Service: Endoscopy;  Laterality: N/A;  10:30 Am   EYE SURGERY Bilateral    removed metal from eye   FEMORAL-TIBIAL BYPASS GRAFT Left 05/27/2020   Procedure: LEFT FEMORAL TO PERONEAL ARTERY BYPASS;  Surgeon: Rosetta Posner, MD;  Location: [place];  Service: Vascular;  Laterality: Left;   HERNIA  REPAIR Right    INGUINAL HERNIA REPAIR Left 11/03/2013   Procedure: HERNIA REPAIR INGUINAL ADULT;  Surgeon: Gayland Curry, MD;  Location: [place];  Service: General;  Laterality: Left;   PERIPHERAL VASCULAR INTERVENTION  10/27/2020   Procedure: PERIPHERAL VASCULAR INTERVENTION;  Surgeon: Marty Heck, MD;  Location: [place] [facility];  Service: Cardiovascular;;   SHOULDER SURGERY     RIGHT SHOULDER    VIDEO BRONCHOSCOPY WITH ENDOBRONCHIAL NAVIGATION N/A 07/10/2019   Procedure: VIDEO BRONCHOSCOPY WITH ENDOBRONCHIAL NAVIGATION;  Surgeon: Melrose Nakayama, MD;  Location: [facility];  Service: Thoracic;  Laterality: N/A;    Current Medications: Outpatient Medications Prior to Visit  Medication Sig Dispense Refill   acetaminophen (TYLENOL) 500 MG tablet Take 1,000 mg by mouth every 6 (six) hours as needed for moderate pain.     Alcohol Swabs (ALCOHOL PADS) 70 % PADS USE 1 PAD AS DIRECTED     aspirin EC 81 MG tablet Take 1 tablet (81 mg total) by mouth daily with breakfast. 30 tablet 2   atorvastatin (LIPITOR) 40 MG tablet Take 40 mg by mouth at bedtime.     B-D UF III MINI PEN NEEDLES 31G X 5 MM MISC SMARTSIG:1 Each SUB-Q Daily     Bacitracin-Polymyxin B (NEOSPORIN EX) Apply 1 application topically daily as needed (wound care).     clopidogrel (PLAVIX) 75 MG tablet Take 1 tablet (75 mg total) by mouth daily. 30 tablet 11   donepezil (ARICEPT) 10 MG tablet Take 10 mg by mouth at bedtime.      feeding supplement, ENSURE COMPLETE, (ENSURE COMPLETE) LIQD Take 237 mLs by mouth 2 (two) times daily between meals. 23700 mL 1   gabapentin (NEURONTIN) 100 MG capsule Take 100 mg by mouth at bedtime.     LANTUS 100 UNIT/ML injection Inject 0.06 mLs (6 Units total) into the skin at bedtime as needed (High blood glucose). If Blood glucose over 200 10 mL 11   losartan (COZAAR) 50 MG tablet Take 0.5 tablets (25 mg total) by mouth daily.     metoprolol succinate (TOPROL-XL) 50 MG 24 hr tablet Take 1 tablet  (50 mg total) by mouth daily. Take with or immediately following a meal. 90 tablet 3   mirtazapine (REMERON) 15 MG tablet Take 0.5 tablets (7.5 mg total) by mouth at bedtime. 30 tablet 2   Multiple Vitamins-Minerals (MULTIVITAMIN WITH MINERALS) tablet Take 1 tablet by mouth daily. 120 tablet 2   ondansetron (ZOFRAN) 4 MG tablet Take 1 tablet (4 mg total) by mouth every 6 (six) hours as needed for nausea. 20 tablet 0   risperiDONE (RISPERDAL) 0.5 MG tablet Take 1 tablet (0.5 mg total) by mouth every 12 (twelve) hours as needed (Restlessness and agitation). 60 tablet  1   torsemide (DEMADEX) 20 MG tablet Take 1.5 tablets (30 mg total) by mouth daily. 45 tablet 3   traMADol (ULTRAM) 50 MG tablet Take 50 mg by mouth every 6 (six) hours as needed for moderate pain.     Vitamin D, Ergocalciferol, (DRISDOL) 1.25 MG (50000 UNIT) CAPS capsule Take 50,000 Units by mouth every Friday.     No facility-administered medications prior to visit.     Allergies:   Bee venom, Codeine, Propoxyphene, and Valsartan   Social History   Socioeconomic History   Marital status: Married    Spouse name: [name]   Number of children: Not on file   Years of education: Not on file   Highest education level: Not on file  Occupational History   Not on file  Tobacco Use   Smoking status: Every Day    Packs/day: 0.50    Types: Cigarettes   Smokeless tobacco: Never   Tobacco comments:    burns them up  Vaping Use   Vaping Use: Never used  Substance and Sexual Activity   Alcohol use: No    Alcohol/week: 0.0 standard drinks   Drug use: Yes    Types: Marijuana    Comment: almost daily for pain   Sexual activity: Never  Other Topics Concern   Not on file  Social History Narrative   Not on file   Social Determinants of Health   Financial Resource Strain: Low Risk    Difficulty of Paying Living Expenses: Not hard at all  Food Insecurity: No Food Insecurity   Worried About Charity fundraiser in the Last Year:  Never true   Ran Out of Food in the Last Year: Never true  Transportation Needs: No Transportation Needs   Lack of Transportation (Medical): No   Lack of Transportation (Non-Medical): No  Physical Activity: Not on file  Stress: No Stress Concern Present   Feeling of Stress : Only a little  Social Connections: Engineer, building services of Communication with Friends and Family: More than three times a week   Frequency of Social Gatherings with Friends and Family: More than three times a week   Attends Religious Services: 1 to 4 times per year   Active Member of [employer] or Organizations: Yes   Attends Archivist Meetings: 1 to 4 times per year   Marital Status: Married     Family History:  The patient's family history includes Heart disease in his mother.   Review of Systems:    Please see the history of present illness.     All other systems reviewed and are otherwise negative except as noted above.   Physical Exam:    VS:  BP 115/62    Pulse 63    Ht 6' (1.829 m)    Wt 120 lb (54.4 kg)    SpO2 99%    BMI 16.27 kg/m    General: Thin elderly male appearing in no acute distress. Head: Normocephalic, atraumatic. Neck: No carotid bruits. JVD not elevated.  Lungs: Respirations regular and unlabored, without wheezes or rales.  Heart: Regular rate and rhythm. No S3 or S4.  No murmur, no rubs, or gallops appreciated. Abdomen: Appears non-distended. No obvious abdominal masses. Msk:  Strength and tone appear normal for age. No obvious joint deformities or effusions. Extremities: No clubbing or cyanosis. No pitting edema.  Left AKA.  Neuro: Alert and oriented X 3. Moves all extremities spontaneously. No focal deficits noted. Psych:  Responds to questions appropriately with a normal affect. Skin: No rashes or lesions noted  Wt Readings from Last 3 Encounters:  02/24/21 120 lb (54.4 kg)  01/25/21 109 lb 5.6 oz (49.6 kg)  01/19/21 115 lb 15.4 oz (52.6 kg)      Studies/Labs Reviewed:   EKG:  EKG is not ordered today.    Recent Labs: 01/14/2021: TSH 2.646 01/17/2021: ALT 79 01/23/2021: B Natriuretic Peptide 1,512.0; Hemoglobin 8.8; Platelets 316 01/24/2021: Magnesium 1.7 01/25/2021: BUN 37; Creatinine, Ser 1.39; Potassium 3.8; Sodium 141   Lipid Panel    Component Value Date/Time   CHOL 114 05/28/2020 0028   TRIG 49 05/28/2020 0028   HDL 41 05/28/2020 0028   CHOLHDL 2.8 05/28/2020 0028   VLDL 10 05/28/2020 0028   LDLCALC 63 05/28/2020 0028    Additional studies/ records that were reviewed today include:   Echocardiogram: 01/14/2021 IMPRESSIONS     1. Left ventricular ejection fraction, by estimation, is 25 to 30%. The  left ventricle has severely decreased function. The left ventricle  demonstrates global hypokinesis with some regional variation. There is  moderate left ventricular hypertrophy. Left   ventricular diastolic parameters are indeterminate.   2. Right ventricular systolic function is mildly reduced. The right  ventricular size is normal. There is severely elevated pulmonary artery  systolic pressure. The estimated right ventricular systolic pressure is  62.9 mmHg.   3. Left atrial size was moderately dilated.   4. Right atrial size was moderately dilated.   5. There is a trivial pericardial effusion posterior to the left  ventricle and localized near the right atrium.   6. The mitral valve is degenerative. Moderate mitral valve regurgitation.   7. Tricuspid valve regurgitation is moderate.   8. The aortic valve is tricuspid. There is mild calcification of the  aortic valve. Aortic valve regurgitation is not visualized.   9. The inferior vena cava is dilated in size with <50% respiratory  variability, suggesting right atrial pressure of 15 mmHg.   Comparison(s): Prior images reviewed side by side. LVEF has decreased  further and estimated PASP is higher.  Assessment:    1. HFrEF (heart failure with reduced  ejection fraction) (HCC)   2. AF (paroxysmal atrial fibrillation) ([place])   3. PAD (peripheral artery disease) ([place])   4. Essential (primary) hypertension   5. Stage 3a chronic kidney disease ([place])      Plan:   In order of problems listed above:  1. HFrEF - Recent echocardiogram showed his EF was at 25-30% and medical management was recommended in the setting of his frail state and his wife did not wish to pursue aggressive work-up. His volume status appears stable on examination today and breathing has been at baseline. Will continue Torsemide 30mg  daily. He did have labs with his PCP last week and we will request a copy of these.  - He remains on Toprol-XL 50mg  daily and Losartan 25mg  daily (Previously intolerant to Valsartan but appears to be tolerating Losartan currently). Doubt BP would allow for switching to [facility] and would not use an SGLT2 inhibitor given his malnutrition.   2. Paroxysmal Atrial Fibrillation - He denies any recent palpitations and appears to be maintaining NSR by examination today. Continue Toprol-XL 50mg  daily for rate-control. The patient and his family have not been interested in pursuing anticoagulation.  3. PAD - He is s/p L AKA in 06/2020 and has follow-up with Vascular Surgery scheduled for later this month. Remains on ASA, Plavix and  Atorvastatin.  4. HTN - His BP is well-controlled at 115/62 during today's visit. Continue current medication regimen.   5. Stage 3 CKD - His creatinine peaked at 2.24 in 12/2020. Improved to 1.39 on 01/25/2021. He did have recent labs with his PCP and we will request a copy of these.    Medication Adjustments/Labs and Tests Ordered: Current medicines are reviewed at length with the patient today.  Concerns regarding medicines are outlined above.  Medication changes, Labs and Tests ordered today are listed in the Patient Instructions below. Patient Instructions  Medication Instructions:   Continue current medication  regimen.   *If you need a refill on your cardiac medications before your next appointment, please call your pharmacy*   Lab Work:  None. We will request labs from Dr. Willey Blade.     Follow-Up: At [facility], you and your health needs are our priority.  As part of our continuing mission to provide you with exceptional heart care, we have created designated Provider Care Teams.  These Care Teams include your primary Cardiologist (physician) and Advanced Practice Providers (APPs -  Physician Assistants and Nurse Practitioners) who all work together to provide you with the care you need, when you need it.  We recommend signing up for the patient portal called "MyChart".  Sign up information is provided on this After Visit Summary.  MyChart is used to connect with patients for Virtual Visits (Telemedicine).  Patients are able to view lab/test results, encounter notes, upcoming appointments, etc.  Non-urgent messages can be sent to your provider as well.   To learn more about what you can do with MyChart, go to [email or web address].    Your next appointment:   3 month(s)  The format for your next appointment:   In Person  Provider:   You may see Rozann Lesches, MD or one of the following Advanced Practice Providers on your designated Care Team:   Bernerd Pho, PA-C  Ermalinda Barrios, PA-C {     Signed, Waynetta Pean  02/24/2021 7:57 PM    [facility] [address] [place], [name] [address] Phone: [phone] Fax: [phone]

## 2021-02-24 NOTE — Patient Instructions (Addendum)
Medication Instructions:   Continue current medication regimen.   *If you need a refill on your cardiac medications before your next appointment, please call your pharmacy*   Lab Work:  None. We will request labs from Dr. Willey Blade.     Follow-Up: At [facility] you and your health needs are our priority.  As part of our continuing mission to provide you with exceptional heart care, we have created designated Provider Care Teams.  These Care Teams include your primary Cardiologist (physician) and Advanced Practice Providers (APPs -  Physician Assistants and Nurse Practitioners) who all work together to provide you with the care you need, when you need it.  We recommend signing up for the patient portal called "MyChart".  Sign up information is provided on this After Visit Summary.  MyChart is used to connect with patients for Virtual Visits (Telemedicine).  Patients are able to view lab/test results, encounter notes, upcoming appointments, etc.  Non-urgent messages can be sent to your provider as well.   To learn more about what you can do with MyChart, go to [email or web address].    Your next appointment:   3 month(s)  The format for your next appointment:   In Person  Provider:   You may see Rozann Lesches, MD or one of the following Advanced Practice Providers on your designated Care Team:   Bernerd Pho, PA-C  Ermalinda Barrios, [place] {

## 2021-02-27 ENCOUNTER — Other Ambulatory Visit: Payer: Self-pay

## 2021-02-27 ENCOUNTER — Encounter (HOSPITAL_COMMUNITY): Payer: Self-pay | Admitting: Physical Therapy

## 2021-02-27 ENCOUNTER — Ambulatory Visit (HOSPITAL_COMMUNITY): Payer: Medicare Other | Attending: Surgical | Admitting: Physical Therapy

## 2021-02-27 DIAGNOSIS — M6281 Muscle weakness (generalized): Secondary | ICD-10-CM | POA: Insufficient documentation

## 2021-02-27 DIAGNOSIS — R2681 Unsteadiness on feet: Secondary | ICD-10-CM | POA: Insufficient documentation

## 2021-02-27 DIAGNOSIS — R262 Difficulty in walking, not elsewhere classified: Secondary | ICD-10-CM | POA: Insufficient documentation

## 2021-02-27 NOTE — Therapy (Signed)
[place], [place], [address] Phone: [phone]   Fax:  [phone]  Physical Therapy Treatment  Patient Details  Name: Allen Yu MRN: [id number] Date of Birth: [birth year] Referring Provider (PT): Nita Sickle, MD   Encounter Date: 02/27/2021   PT End of Session - 02/27/21 1313     Visit Number 6    Number of Visits 15    Date for PT Re-Evaluation 04/20/21    Authorization Type Medicare Part A and B; [employer]    PT Start Time 1303    PT Stop Time 1345    PT Time Calculation (min) 42 min    Activity Tolerance Patient tolerated treatment well;Patient limited by fatigue    Behavior During Therapy [facility] for tasks assessed/performed             Past Medical History:  Diagnosis Date   Anemia    Arthritis    Carotid stenosis    Chronic back pain    Chronic HFrEF (heart failure with reduced ejection fraction) ([place])    a. 09/2020 Echo: EF 45%, mild LVH; b. 12/2020 Echo: EF 25-30%, glob HK, mod LVH, mildly reduced RV fxn, RVSP 65.51mmHg, mod BAE. Triv effusion. Mod MR/TR.   CKD (chronic kidney disease), stage III ([place])    Constipation    Dementia ([place])    Diabetes mellitus    Dilated cardiomyopathy ([place])    a. 09/2020 Echo: EF 45%; b. 12/2020 Echo: EF 25-30%.   GERD (gastroesophageal reflux disease)    History of kidney stones    Hypertension    Lung nodule    PAF (paroxysmal atrial fibrillation) (HCC)    Peripheral neuropathy    Peripheral vascular disease ([place])    a. 06/2020 s/p L AKA; b. 10/2020 s/p R SFA and above-knee popliteal PTA.   PTSD (post-traumatic stress disorder)    Schizophrenia ([place])    Tuberculosis    Treated    Past Surgical History:  Procedure Laterality Date   ABDOMINAL AORTOGRAM W/LOWER EXTREMITY N/A 05/20/2020   Procedure: ABDOMINAL AORTOGRAM W/LOWER EXTREMITY;  Surgeon: Elam Dutch, MD;  Location: [facility];  Service: Cardiovascular;  Laterality: N/A;    ABDOMINAL AORTOGRAM W/LOWER EXTREMITY N/A 10/27/2020   Procedure: ABDOMINAL AORTOGRAM W/LOWER EXTREMITY;  Surgeon: Marty Heck, MD;  Location: [facility];  Service: Cardiovascular;  Laterality: N/A;   AMPUTATION Left 06/28/2020   Procedure: AMPUTATION ABOVE KNEE LEFT;  Surgeon: Rosetta Posner, MD;  Location: [facility] OR;  Service: Vascular;  Laterality: Left;   [place], 2013   x2   BLADDER SURGERY     02   CHOLECYSTECTOMY     COLONOSCOPY     COLONOSCOPY N/A 12/07/2014   Procedure: COLONOSCOPY;  Surgeon: Daneil Dolin, MD;  [facility];  Service: Endoscopy;  Laterality: N/A;  10:30 Am   EYE SURGERY Bilateral    removed metal from eye   FEMORAL-TIBIAL BYPASS GRAFT Left 05/27/2020   Procedure: LEFT FEMORAL TO PERONEAL ARTERY BYPASS;  Surgeon: Rosetta Posner, MD;  Location: [facility];  Service: Vascular;  Laterality: Left;   HERNIA REPAIR Right    INGUINAL HERNIA REPAIR Left 11/03/2013   Procedure: HERNIA REPAIR INGUINAL ADULT;  Surgeon: Gayland Curry, MD;  Location: [facility];  Service: General;  Laterality: Left;   PERIPHERAL VASCULAR INTERVENTION  10/27/2020   Procedure: PERIPHERAL VASCULAR INTERVENTION;  Surgeon: Marty Heck, MD;  Location: [facility];  Service: Cardiovascular;;   SHOULDER SURGERY     RIGHT SHOULDER    VIDEO BRONCHOSCOPY WITH ENDOBRONCHIAL NAVIGATION N/A 07/10/2019   Procedure: VIDEO BRONCHOSCOPY WITH ENDOBRONCHIAL NAVIGATION;  Surgeon: Melrose Nakayama, MD;  Location: [facility];  Service: Thoracic;  Laterality: N/A;    There were no vitals filed for this visit.   Subjective Assessment - 02/27/21 1312     Subjective No new issues    Patient is accompained by: Family member   PCA   Currently in Pain? Yes    Pain Score 10-Worst pain ever    Pain Location --   generalized                              [facility] Adult PT Treatment/Exercise - 02/27/21 0001       Knee/Hip Exercises: Seated    Other Seated Knee/Hip Exercises GTB rows 3 x 10, palloff press GTB 3 x 10 each, LAQ with 5lb 3 x 10    Other Seated Knee/Hip Exercises 3/3 UBE lv 1 for dynamic UE warmup      Knee/Hip Exercises: Supine   Other Supine Knee/Hip Exercises left hip extension isometric on towel roll 10 x 3"      Knee/Hip Exercises: Prone   Other Prone Exercises prone lying for hip flexor stretch 3 minutes                       PT Short Term Goals - 02/13/21 1450       PT SHORT TERM GOAL #1   Title Demonstrate bed mobility with modified independence to reduce level of assistance    Baseline min-mod A    Time 6    Period Weeks    Status On-going    Target Date 03/09/21      PT SHORT TERM GOAL #2   Title Sit to stand transfer with min A with RW to improve standing tolerance    Baseline max A with HHA    Time 6    Period Weeks    Status On-going    Target Date 03/09/21      PT SHORT TERM GOAL #3   Title Squat-pivot transfer min A to improve safety with maneuvering in small spaces    Baseline max A    Time 6    Period Weeks    Status On-going    Target Date 03/09/21      PT SHORT TERM GOAL #4   Title Demo RLE 4/5 to prepare for gait/transfers    Baseline 3/5    Time 6    Period Weeks    Status On-going    Target Date 03/09/21               PT Long Term Goals - 01/26/21 1457       PT LONG TERM GOAL #1   Title Ambulate x 25 ft with RW with/without prosthetic to improve gait tolerance    Baseline unable    Time 12    Period Weeks    Status New    Target Date 04/20/21      PT LONG TERM GOAL #2   Title Stand-pivot transfer with supervision to increase functional independence    Baseline max A    Time 12    Period Weeks    Status New  Target Date 04/20/21                   Plan - 02/27/21 1533     Clinical Impression Statement Patient tolerated session well today. He was able to perform prone laying for hip flexor stretching without complaint.  Continued to be limited by LT hip extensor weakness/ activation and also demos mild contracture as he is unable to actively extend either hip beyond midline. Educated patient and caregiver on addition of prone lying stretch to HEP to improve hip extensor activity for improved functional mobility. Will continue to progress activity as able.    Personal Factors and Comorbidities Age;Comorbidity 3+;Time since onset of injury/illness/exacerbation;Fitness    Comorbidities dementia, CKD, DM    Examination-Activity Limitations Bed Mobility;Lift;Toileting;Stand;Reach Overhead;Locomotion Level;Transfers    Stability/Clinical Decision Making Evolving/Moderate complexity    Rehab Potential Fair    PT Frequency 1x / week    PT Duration 12 weeks    PT Treatment/Interventions ADLs/Self Care Home Management;DME Instruction;Gait training;Functional mobility training;Therapeutic activities;Therapeutic exercise;Balance training;Prosthetic Training;Patient/family education;Neuromuscular re-education;Wheelchair mobility training;Manual techniques;Taping    PT Next Visit Plan Progress general strength and condition for functioal mobility and transfers    PT Home Exercise Plan left hip flexor stretch (Thomas test position), hip extension isometric, RLE SLR; 19 - SAQ, LAQ 1/30 band rows seated 2/6 prone laying hip flexor stretch    Consulted and Agree with Plan of Care Patient;Family member/caregiver    Family Member Consulted PCA             Patient will benefit from skilled therapeutic intervention in order to improve the following deficits and impairments:  Decreased activity tolerance, Decreased balance, Decreased mobility, Decreased knowledge of use of DME, Decreased endurance, Decreased range of motion, Decreased strength, Difficulty walking, Impaired flexibility, Decreased safety awareness  Visit Diagnosis: Muscle weakness (generalized)  Difficulty in walking, not elsewhere classified  Unsteadiness on  feet     Problem List Patient Active Problem List   Diagnosis Date Noted   Acute respiratory failure with hypoxia and hypercarbia (HCC) 01/23/2021   CKD (chronic kidney disease) stage 3, GFR 30-59 ml/min (HCC) 01/23/2021   Acute on chronic systolic CHF (congestive heart failure) ([place]) 01/23/2021   Acute respiratory failure with hypoxia ([place]) 01/13/2021   Encounter for fitting and adjustment of hearing aid 12/21/2020   Other specified problems related to psychosocial circumstances 11/21/2020   Sensorineural hearing loss, bilateral 11/21/2020   Tobacco dependence 10/06/2020   Benign prostatic hyperplasia with urinary obstruction 10/06/2020   Above knee amputation of left lower extremity ([place]) 10/06/2020   Other specified counseling 10/06/2020   Severe sepsis ([place]) 09/24/2020   Schizophrenia, unspecified ([place]) 09/24/2020   Unspecified dementia, unspecified severity, without behavioral disturbance, psychotic disturbance, mood disturbance, and anxiety ([place]) 09/24/2020   Lung nodule 09/24/2020   Indwelling Foley catheter present 09/24/2020   Peripheral vascular disease ([place]) 09/24/2020   Mass of testicle 09/24/2020   Problem related to unspecified psychosocial circumstances 08/01/2020   Hypermetropia 07/22/2020   Unspecified atrial fibrillation ([place]) 07/22/2020   Klebsiella sepsis ([place]) 07/22/2020   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   SIRS (systemic inflammatory response syndrome) ([place]) 47/82/9562   Acute metabolic encephalopathy 13/08/6576   Hyponatremia    S/P AKA (above knee amputation) unilateral, left ([place]) 06/28/2020   Gangrene of left foot ([place]) 06/28/2020   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 ([place]) 05/27/2020   Critical lower limb ischemia ([place]) 05/21/2020   Malnutrition ([place]) 05/19/2020   Plantar  callus 12/24/2019   Cavitary pneumonia 09/14/2019   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   Unintentional weight loss 09/14/2019   Coronary artery  calcification seen on CT scan 10/28/2018   Hilar adenopathy 04/10/2016   Lung nodule seen on imaging study 07/09/2015   Acute encephalopathy    Hypoglycemia due to insulin    Type 2 diabetes mellitus with other circulatory complications ([place]) 86/75/4492   Acute kidney injury superimposed on chronic kidney disease ([place]) 07/08/2015   Tobacco use disorder 07/08/2015   Diverticulosis of colon without hemorrhage    Left inguinal hernia 09/16/2013   Altered mental status 11/17/2012   Hypothermia 11/17/2012   Leukocytosis 11/17/2012   Chronic kidney disease, stage 3 unspecified ([place])    Post-traumatic stress disorder, chronic    GERD (gastroesophageal reflux disease)    Essential (primary) hypertension 09/10/2011   Anemia 09/10/2011   Diabetic neuropathy ([place]) 09/10/2011   DDD (degenerative disc disease), lumbar 09/10/2011   Psychosis ([place]) 09/10/2011   Diabetes mellitus type 2, uncontrolled 09/10/2011   Hyperlipemia 09/10/2011   Microalbuminuria 09/10/2011   3:38 PM, 02/27/21 Josue Hector PT DPT  Physical Therapist with [facility]  [phone] [place], [place], [address] Phone: [phone]   Fax:  [phone]  Name: Allen Yu MRN: [id number] Date of Birth: 1945-06-25

## 2021-02-28 ENCOUNTER — Ambulatory Visit (HOSPITAL_COMMUNITY)
Admission: RE | Admit: 2021-02-28 | Discharge: 2021-02-28 | Disposition: A | Discharge status: Home / Self Care | Payer: Medicare Other | Source: Ambulatory Visit | Attending: Vascular Surgery | Admitting: Vascular Surgery

## 2021-02-28 ENCOUNTER — Ambulatory Visit (INDEPENDENT_AMBULATORY_CARE_PROVIDER_SITE_OTHER): Payer: Medicare Other | Admitting: Physician Assistant

## 2021-02-28 ENCOUNTER — Ambulatory Visit (INDEPENDENT_AMBULATORY_CARE_PROVIDER_SITE_OTHER)
Admission: RE | Admit: 2021-02-28 | Discharge: 2021-02-28 | Disposition: A | Discharge status: Home / Self Care | Payer: Medicare Other | Source: Ambulatory Visit | Attending: Vascular Surgery | Admitting: Vascular Surgery

## 2021-02-28 ENCOUNTER — Encounter: Payer: Self-pay | Admitting: Physician Assistant

## 2021-02-28 VITALS — BP 138/75 | HR 82 | Temp 97.0°F | Resp 18 | Ht 72.0 in | Wt 120.0 lb

## 2021-02-28 DIAGNOSIS — I739 Peripheral vascular disease, unspecified: Secondary | ICD-10-CM | POA: Insufficient documentation

## 2021-02-28 NOTE — Progress Notes (Signed)
[facility] [place] HISTORY AND PHYSICAL   History of Present Illness:  Patient is a 76 y.o. year old male who presents for evaluation of lower extremity PAD.  He has history of previous intervention on 10/27/2020 he underwent aortogram with right peroneal and TP trunk angioplasty, right SFA and AK popliteal angioplasty by Dr. Carlis Abbott.  This was performed secondary to right LE rest pain with dorsal blistering.     On his last visit the blisters had healed and he had intact doppler signals.  He is not ambulatory and relies on a WC for mobility.  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 Donnetta Hutching.    His wife is with him today and states she is happy his skin has healed and he has no wounds at this time.     He is medically managed on ASA, Plavix and a daily Statin.  Past Medical History:  Diagnosis Date   Anemia    Arthritis    Carotid stenosis    Chronic back pain    Chronic HFrEF (heart failure with reduced ejection fraction) ([place])    a. 09/2020 Echo: EF 45%, mild LVH; b. 12/2020 Echo: EF 25-30%, glob HK, mod LVH, mildly reduced RV fxn, RVSP 65.76mmHg, mod BAE. Triv effusion. Mod MR/TR.   CKD (chronic kidney disease), stage III ([place])    Constipation    Dementia ([place])    Diabetes mellitus    Dilated cardiomyopathy ([place])    a. 09/2020 Echo: EF 45%; b. 12/2020 Echo: EF 25-30%.   GERD (gastroesophageal reflux disease)    History of kidney stones    Hypertension    Lung nodule    PAF (paroxysmal atrial fibrillation) ([facility])    Peripheral neuropathy    Peripheral vascular disease ([place])    a. 06/2020 s/p L AKA; b. 10/2020 s/p R SFA and above-knee popliteal PTA.   PTSD (post-traumatic stress disorder)    Schizophrenia ([place])    Tuberculosis    Treated    Past Surgical History:  Procedure Laterality Date   ABDOMINAL AORTOGRAM W/LOWER EXTREMITY N/A 05/20/2020   Procedure: ABDOMINAL AORTOGRAM W/LOWER EXTREMITY;  Surgeon: Elam Dutch, MD;  Location: [place] [facility];  Service: Cardiovascular;  Laterality: N/A;   ABDOMINAL AORTOGRAM W/LOWER EXTREMITY N/A 10/27/2020   Procedure: ABDOMINAL AORTOGRAM W/LOWER EXTREMITY;  Surgeon: Marty Heck, MD;  Location: [facility];  Service: Cardiovascular;  Laterality: N/A;   AMPUTATION Left 06/28/2020   Procedure: AMPUTATION ABOVE KNEE LEFT;  Surgeon: Rosetta Posner, MD;  Location: [facility];  Service: Vascular;  Laterality: Left;   [place], 2013   x2   BLADDER SURGERY     02   CHOLECYSTECTOMY     COLONOSCOPY     COLONOSCOPY N/A 12/07/2014   Procedure: COLONOSCOPY;  Surgeon: Daneil Dolin, MD;  [facility];  Service: Endoscopy;  Laterality: N/A;  10:30 Am   EYE SURGERY Bilateral    removed metal from eye   FEMORAL-TIBIAL BYPASS GRAFT Left 05/27/2020   Procedure: LEFT FEMORAL TO PERONEAL ARTERY BYPASS;  Surgeon: Rosetta Posner, MD;  Location: [place];  Service: Vascular;  Laterality: Left;   HERNIA REPAIR Right    INGUINAL HERNIA REPAIR Left 11/03/2013   Procedure: HERNIA REPAIR INGUINAL ADULT;  Surgeon: Gayland Curry, MD;  Location: [place];  Service: General;  Laterality: Left;   PERIPHERAL VASCULAR INTERVENTION  10/27/2020   Procedure: PERIPHERAL VASCULAR INTERVENTION;  Surgeon: Marty Heck, MD;  Location: [facility];  Service: Cardiovascular;;   SHOULDER SURGERY     RIGHT SHOULDER    VIDEO BRONCHOSCOPY WITH ENDOBRONCHIAL NAVIGATION N/A 07/10/2019   Procedure: VIDEO BRONCHOSCOPY WITH ENDOBRONCHIAL NAVIGATION;  Surgeon: Melrose Nakayama, MD;  Location: [facility] OR;  Service: Thoracic;  Laterality: N/A;    ROS:   General:  No weight loss, Fever, chills  HEENT: No recent headaches, no nasal bleeding, no visual changes, no sore throat  Neurologic: No dizziness, blackouts, seizures. No recent symptoms of stroke or mini- stroke. No recent episodes of slurred speech, or temporary blindness.  Cardiac: No recent episodes of chest  pain/pressure, no shortness of breath at rest.  No shortness of breath with exertion.  Denies history of atrial fibrillation or irregular heartbeat  Vascular: No history of rest pain in feet.  No history of claudication.  positive history of non-healing ulcer, No history of DVT   Pulmonary: No home oxygen, no productive cough, no hemoptysis,  No asthma or wheezing  Musculoskeletal:  [ ]  Arthritis, [ ]  Low back pain,  [ ]  Joint pain [x]  left ALKA phantom pain  Hematologic:No history of hypercoagulable state.  No history of easy bleeding.  No history of anemia  Gastrointestinal: No hematochezia or melena,  No gastroesophageal reflux, no trouble swallowing  Urinary: [ ]  chronic Kidney disease, [ ]  on HD - [ ]  MWF or [ ]  TTHS, [ ]  Burning with urination, [ ]  Frequent urination, [ ]  Difficulty urinating;   Skin: No rashes  Psychological: No history of anxiety,  No history of depression  Social History Social History   Tobacco Use   Smoking status: Every Day    Packs/day: 0.50    Types: Cigarettes   Smokeless tobacco: Never   Tobacco comments:    burns them up  Vaping Use   Vaping Use: Never used  Substance Use Topics   Alcohol use: No    Alcohol/week: 0.0 standard drinks   Drug use: Yes    Types: Marijuana    Comment: almost daily for pain    Family History Family History  Problem Relation Age of Onset   Heart disease Mother        before age 35    Allergies  Allergies  Allergen Reactions   Bee Venom Anaphylaxis   Codeine Other (See Comments)    incoherent  Other reaction(s): Delirium   Propoxyphene Other (See Comments)    Dizziness, "Makes me feel drunk" Other reaction(s): Dizziness   Valsartan Other (See Comments)    incoherent Other reaction(s): Delirium     Current Outpatient Medications  Medication Sig Dispense Refill   acetaminophen (TYLENOL) 500 MG tablet Take 1,000 mg by mouth every 6 (six) hours as needed for moderate pain.     Alcohol Swabs  (ALCOHOL PADS) 70 % PADS USE 1 PAD AS DIRECTED     aspirin EC 81 MG tablet Take 1 tablet (81 mg total) by mouth daily with breakfast. 30 tablet 2   atorvastatin (LIPITOR) 40 MG tablet Take 40 mg by mouth at bedtime.     B-D UF III MINI PEN NEEDLES 31G X 5 MM MISC SMARTSIG:1 Each SUB-Q Daily     Bacitracin-Polymyxin B (NEOSPORIN EX) Apply 1 application topically daily as needed (wound care).     clopidogrel (PLAVIX) 75 MG tablet Take 1 tablet (75 mg total) by mouth daily. 30 tablet 11  donepezil (ARICEPT) 10 MG tablet Take 10 mg by mouth at bedtime.      feeding supplement, ENSURE COMPLETE, (ENSURE COMPLETE) LIQD Take 237 mLs by mouth 2 (two) times daily between meals. 23700 mL 1   gabapentin (NEURONTIN) 100 MG capsule Take 100 mg by mouth at bedtime.     LANTUS 100 UNIT/ML injection Inject 0.06 mLs (6 Units total) into the skin at bedtime as needed (High blood glucose). If Blood glucose over 200 10 mL 11   losartan (COZAAR) 50 MG tablet Take 0.5 tablets (25 mg total) by mouth daily.     mirtazapine (REMERON) 15 MG tablet Take 0.5 tablets (7.5 mg total) by mouth at bedtime. 30 tablet 2   Multiple Vitamins-Minerals (MULTIVITAMIN WITH MINERALS) tablet Take 1 tablet by mouth daily. 120 tablet 2   ondansetron (ZOFRAN) 4 MG tablet Take 1 tablet (4 mg total) by mouth every 6 (six) hours as needed for nausea. 20 tablet 0   risperiDONE (RISPERDAL) 0.5 MG tablet Take 1 tablet (0.5 mg total) by mouth every 12 (twelve) hours as needed (Restlessness and agitation). 60 tablet 1   torsemide (DEMADEX) 20 MG tablet Take 1.5 tablets (30 mg total) by mouth daily. 45 tablet 3   traMADol (ULTRAM) 50 MG tablet Take 50 mg by mouth every 6 (six) hours as needed for moderate pain.     Vitamin D, Ergocalciferol, (DRISDOL) 1.25 MG (50000 UNIT) CAPS capsule Take 50,000 Units by mouth every Friday.     metoprolol succinate (TOPROL-XL) 50 MG 24 hr tablet Take 1 tablet (50 mg total) by mouth daily. Take with or immediately  following a meal. 90 tablet 3   No current facility-administered medications for this visit.    Physical Examination  Vitals:   02/28/21 1042  BP: 138/75  Pulse: 82  Resp: 18  Temp: (!) 97 F (36.1 C)  TempSrc: Temporal  SpO2: 100%  Weight: 120 lb (54.4 kg)  Height: 6' (1.829 m)    Body mass index is 16.27 kg/m.  General:  Alert and oriented, no acute distress HEENT: Normal Neck: No bruit or JVD Pulmonary: Clear to auscultation bilaterally Cardiac: Regular Rate and Rhythm without murmur Abdomen: Soft, non-tender, non-distended, no mass, no scars Skin: No rash, clean dry without wounds  Musculoskeletal: muscle waisting right LE and UE  Neurologic: Upper and lower extremity motor 5/5 and symmetric  DATA:  +-----------+--------+-----+--------------+----------+---------------------  ----+   RIGHT       PSV cm/s Ratio Stenosis       Waveform   Comments                     +-----------+--------+-----+--------------+----------+---------------------  ----+   CFA Prox    103                           biphasic                                +-----------+--------+-----+--------------+----------+---------------------  ----+   CFA Distal  71                            biphasic                                +-----------+--------+-----+--------------+----------+---------------------  ----+   DFA  90                            biphasic                                +-----------+--------+-----+--------------+----------+---------------------  ----+   SFA Prox    64                            biphasic                                +-----------+--------+-----+--------------+----------+---------------------  ----+   SFA Mid     52                            biphasic                                +-----------+--------+-----+--------------+----------+---------------------  ----+   SFA Distal  94             30-49%         triphasic  Irregular calcified                                        stenosis                  plaque                       +-----------+--------+-----+--------------+----------+---------------------  ----+   POP Prox    98             30-49%         monophasic Irregular calcified                                       stenosis                  plaque                       +-----------+--------+-----+--------------+----------+---------------------  ----+   POP Distal  157                           monophasic                              +-----------+--------+-----+--------------+----------+---------------------  ----+   ATA Distal  0                             absent                                  +-----------+--------+-----+--------------+----------+---------------------  ----+   PTA Distal  0  absent                                  +-----------+--------+-----+--------------+----------+---------------------  ----+   PERO Distal 20                            monophasic                              +-----------+--------+-----+--------------+----------+---------------------  ----+    Summary:  Right: Total occlusion noted in the anterior tibial artery. Total  occlusion noted in the posterior tibial artery. No obvious focal stenosis  detected in SFA or CFA.       ABI Findings:  +---------+------------------+-----+----------+--------+   Right     Rt Pressure (mmHg) Index Waveform   Comment    +---------+------------------+-----+----------+--------+   Brachial  162                                            +---------+------------------+-----+----------+--------+   PTA       0                  0.00  absent                +---------+------------------+-----+----------+--------+   PERO      89                 0.53  monophasic            +---------+------------------+-----+----------+--------+   DP        91                 0.54  monophasic             +---------+------------------+-----+----------+--------+   Great Toe 0                  0.00                        +---------+------------------+-----+----------+--------+   +--------+------------------+-----+--------+-------+   Left     Lt Pressure (mmHg) Index Waveform Comment   +--------+------------------+-----+--------+-------+   Brachial 167                                         +--------+------------------+-----+--------+-------+   +-------+-----------+-----------+----------------+------------+   ABI/TBI Today's ABI Today's TBI Previous ABI     Previous TBI   +-------+-----------+-----------+----------------+------------+   Right   0.54        0           1.02 (calcified)                +-------+-----------+-----------+----------------+------------+   Left    Amputation  Amputation  Amputation       Amputation     +-------+-----------+-----------+----------------+------------+    Right ABIs appear decreased compared to prior study on 11/02/2020.     Summary:  Right: Resting right ankle-brachial index indicates moderate right lower  extremity arterial disease. The right toe-brachial index is abnormal.   ASSESSMENT/PLAN:  76 y/o male with history of B le peripheral artery disease.  He has history of left LE bypass  that failed and he went on to have left AKA that healed well.  He continues to have phantom pain.  He recently under went angiography of the right LE with angioplasty of the right peroneal and TP trunk as well as the SFA.  He has no skin breakdown that indicates ischemic changes.  He does not ambulate and is WC dependent.    ABI demonstrates significant tibial disease as well as arterial duplex.    His wife states they know there is no revascularization options at this point and his skin is not compromised.  They requested to f/u as needed in the future.  He is f/u at the [place] on a frequent basis.      Roxy Horseman PA-C [facility]  [place] Office: [phone]  MD in clinic [place]

## 2021-03-01 ENCOUNTER — Ambulatory Visit: Payer: TRICARE For Life (TFL) | Admitting: Student

## 2021-03-03 ENCOUNTER — Telehealth: Payer: Self-pay

## 2021-03-03 DIAGNOSIS — Z79899 Other long term (current) drug therapy: Secondary | ICD-10-CM

## 2021-03-03 NOTE — Telephone Encounter (Signed)
-----   Message from Erma Heritage, [place] sent at 03/01/2021 11:33 AM EST ----- Please let the patient know I reviewed recent labs from his PCP dated 02/06/2021 and his potassium was elevated at 5.4 and renal function had slightly worsened with creatinine at 1.79. Can we please follow-up with the patient to make sure his PCP was checking repeat labs? If he was not, would recommend repeating a CBC and BMET within the next 7-10 days.

## 2021-03-03 NOTE — Telephone Encounter (Signed)
I spoke with [facility], [name]. She states they have a follow up appointment with Dr.Fagan on 03/17/21.   I will also forward this message to Dr.Fagan.

## 2021-03-06 ENCOUNTER — Encounter (HOSPITAL_COMMUNITY): Payer: Self-pay | Admitting: Physical Therapy

## 2021-03-06 ENCOUNTER — Ambulatory Visit (HOSPITAL_COMMUNITY): Payer: Medicare Other | Admitting: Physical Therapy

## 2021-03-06 ENCOUNTER — Other Ambulatory Visit: Payer: Self-pay

## 2021-03-06 DIAGNOSIS — R2681 Unsteadiness on feet: Secondary | ICD-10-CM

## 2021-03-06 DIAGNOSIS — R262 Difficulty in walking, not elsewhere classified: Secondary | ICD-10-CM

## 2021-03-06 DIAGNOSIS — M6281 Muscle weakness (generalized): Secondary | ICD-10-CM | POA: Diagnosis not present

## 2021-03-06 NOTE — Patient Instructions (Signed)
Access Code: [email or web address] URL: [email or web address] Date: 03/06/2021 Prepared by: Josue Hector  Exercises Seated Hamstring Curl with Anchored Resistance - 2-3 x daily - 7 x weekly - 3 sets - 10 reps

## 2021-03-06 NOTE — Therapy (Signed)
[place], [place], [address] Phone: [phone]   Fax:  [phone]  Physical Therapy Treatment  Patient Details  Name: Allen Yu MRN: [id number] Date of Birth: [birth year] Referring Provider (PT): Nita Sickle, MD   Encounter Date: 03/06/2021   PT End of Session - 03/06/21 1312     Visit Number 7    Number of Visits 15    Date for PT Re-Evaluation 04/20/21    Authorization Type Medicare Part A and B; [employer]    PT Start Time 1304    PT Stop Time 2924    PT Time Calculation (min) 44 min    Activity Tolerance Patient tolerated treatment well;Patient limited by fatigue    Behavior During Therapy [facility] for tasks assessed/performed             Past Medical History:  Diagnosis Date   Anemia    Arthritis    Carotid stenosis    Chronic back pain    Chronic HFrEF (heart failure with reduced ejection fraction) ([place])    a. 09/2020 Echo: EF 45%, mild LVH; b. 12/2020 Echo: EF 25-30%, glob HK, mod LVH, mildly reduced RV fxn, RVSP 65.71mmHg, mod BAE. Triv effusion. Mod MR/TR.   CKD (chronic kidney disease), stage III ([place])    Constipation    Dementia ([place])    Diabetes mellitus    Dilated cardiomyopathy ([place])    a. 09/2020 Echo: EF 45%; b. 12/2020 Echo: EF 25-30%.   GERD (gastroesophageal reflux disease)    History of kidney stones    Hypertension    Lung nodule    PAF (paroxysmal atrial fibrillation) (HCC)    Peripheral neuropathy    Peripheral vascular disease ([place])    a. 06/2020 s/p L AKA; b. 10/2020 s/p R SFA and above-knee popliteal PTA.   PTSD (post-traumatic stress disorder)    Schizophrenia ([place])    Tuberculosis    Treated    Past Surgical History:  Procedure Laterality Date   ABDOMINAL AORTOGRAM W/LOWER EXTREMITY N/A 05/20/2020   Procedure: ABDOMINAL AORTOGRAM W/LOWER EXTREMITY;  Surgeon: Elam Dutch, MD;  Location: [place] [facility];  Service: Cardiovascular;  Laterality: N/A;    ABDOMINAL AORTOGRAM W/LOWER EXTREMITY N/A 10/27/2020   Procedure: ABDOMINAL AORTOGRAM W/LOWER EXTREMITY;  Surgeon: Marty Heck, MD;  Location: [place] [facility];  Service: Cardiovascular;  Laterality: N/A;   AMPUTATION Left 06/28/2020   Procedure: AMPUTATION ABOVE KNEE LEFT;  Surgeon: Rosetta Posner, MD;  Location: [facility] OR;  Service: Vascular;  Laterality: Left;   [place], 2013   x2   BLADDER SURGERY     02   CHOLECYSTECTOMY     COLONOSCOPY     COLONOSCOPY N/A 12/07/2014   Procedure: COLONOSCOPY;  Surgeon: Daneil Dolin, MD;  [facility];  Service: Endoscopy;  Laterality: N/A;  10:30 Am   EYE SURGERY Bilateral    removed metal from eye   FEMORAL-TIBIAL BYPASS GRAFT Left 05/27/2020   Procedure: LEFT FEMORAL TO PERONEAL ARTERY BYPASS;  Surgeon: Rosetta Posner, MD;  Location: [place];  Service: Vascular;  Laterality: Left;   HERNIA REPAIR Right    INGUINAL HERNIA REPAIR Left 11/03/2013   Procedure: HERNIA REPAIR INGUINAL ADULT;  Surgeon: Gayland Curry, MD;  Location: [place];  Service: General;  Laterality: Left;   PERIPHERAL VASCULAR INTERVENTION  10/27/2020   Procedure: PERIPHERAL VASCULAR INTERVENTION;  Surgeon: Marty Heck, MD;  Location: [place] [facility];  Service: Cardiovascular;;   SHOULDER SURGERY     RIGHT SHOULDER    VIDEO BRONCHOSCOPY WITH ENDOBRONCHIAL NAVIGATION N/A 07/10/2019   Procedure: VIDEO BRONCHOSCOPY WITH ENDOBRONCHIAL NAVIGATION;  Surgeon: Melrose Nakayama, MD;  Location: [place];  Service: Thoracic;  Laterality: N/A;    There were no vitals filed for this visit.   Subjective Assessment - 03/06/21 1311     Subjective No new reports. Patient appears more lethargic and didnt want to come today.    Patient is accompained by: --   PCA   Currently in Pain? Yes    Pain Score 10-Worst pain ever    Pain Location --   generalized                              [facility] Adult PT Treatment/Exercise -  03/06/21 0001       Knee/Hip Exercises: Seated   Long Arc Quad Strengthening;Right;3 sets;10 reps    Long Arc Quad Weight 5 lbs.    Other Seated Knee/Hip Exercises GTB rows 3 x 10, palloff press GTB 3 x 10 each, LAQ with 5lb 3 x 10, curls 5lb 3 x10, shoulder press 3 x 10 with 5lb    Other Seated Knee/Hip Exercises 3/3 UBE lv 1 for dynamic UE warmup, GTB hamstring curl GTB 3 x10      Knee/Hip Exercises: Prone   Other Prone Exercises prone lying for hip flexor stretch 3 minutes                       PT Short Term Goals - 02/13/21 1450       PT SHORT TERM GOAL #1   Title Demonstrate bed mobility with modified independence to reduce level of assistance    Baseline min-mod A    Time 6    Period Weeks    Status On-going    Target Date 03/09/21      PT SHORT TERM GOAL #2   Title Sit to stand transfer with min A with RW to improve standing tolerance    Baseline max A with HHA    Time 6    Period Weeks    Status On-going    Target Date 03/09/21      PT SHORT TERM GOAL #3   Title Squat-pivot transfer min A to improve safety with maneuvering in small spaces    Baseline max A    Time 6    Period Weeks    Status On-going    Target Date 03/09/21      PT SHORT TERM GOAL #4   Title Demo RLE 4/5 to prepare for gait/transfers    Baseline 3/5    Time 6    Period Weeks    Status On-going    Target Date 03/09/21               PT Long Term Goals - 01/26/21 1457       PT LONG TERM GOAL #1   Title Ambulate x 25 ft with RW with/without prosthetic to improve gait tolerance    Baseline unable    Time 12    Period Weeks    Status New    Target Date 04/20/21      PT LONG TERM GOAL #2   Title Stand-pivot transfer with supervision to increase functional independence  Baseline max A    Time 12    Period Weeks    Status New    Target Date 04/20/21                   Plan - 03/06/21 1405     Clinical Impression Statement Patient tolerated session  well today and is able to progress LE strengthening activity. Patient continues to require frequent verbal cues for proper form and mechanics with exercise. Patient becoming somewhat agitated with repeated request to lay in prone position for hip stretching due to confusion with task. Added hamstring curls to HEP and issued thera band. Patient will continue to benefit from skilled therapy services to reduce deficits and improve functional level.    Personal Factors and Comorbidities Age;Comorbidity 3+;Time since onset of injury/illness/exacerbation;Fitness    Comorbidities dementia, CKD, DM    Examination-Activity Limitations Bed Mobility;Lift;Toileting;Stand;Reach Overhead;Locomotion Level;Transfers    Stability/Clinical Decision Making Evolving/Moderate complexity    Rehab Potential Fair    PT Frequency 1x / week    PT Duration 12 weeks    PT Treatment/Interventions ADLs/Self Care Home Management;DME Instruction;Gait training;Functional mobility training;Therapeutic activities;Therapeutic exercise;Balance training;Prosthetic Training;Patient/family education;Neuromuscular re-education;Wheelchair mobility training;Manual techniques;Taping    PT Next Visit Plan Progress general strength and condition for functional mobility and transfers    PT Home Exercise Plan left hip flexor stretch (Thomas test position), hip extension isometric, RLE SLR; 19 - SAQ, LAQ 1/30 band rows seated 2/6 prone laying hip flexor stretch 2/13 hamstring curls    Consulted and Agree with Plan of Care Patient;Family member/caregiver    Family Member Consulted PCA             Patient will benefit from skilled therapeutic intervention in order to improve the following deficits and impairments:  Decreased activity tolerance, Decreased balance, Decreased mobility, Decreased knowledge of use of DME, Decreased endurance, Decreased range of motion, Decreased strength, Difficulty walking, Impaired flexibility, Decreased safety  awareness  Visit Diagnosis: Muscle weakness (generalized)  Difficulty in walking, not elsewhere classified  Unsteadiness on feet     Problem List Patient Active Problem List   Diagnosis Date Noted   Acute respiratory failure with hypoxia and hypercarbia (HCC) 01/23/2021   CKD (chronic kidney disease) stage 3, GFR 30-59 ml/min (HCC) 01/23/2021   Acute on chronic systolic CHF (congestive heart failure) ([place]) 01/23/2021   Acute respiratory failure with hypoxia ([place]) 01/13/2021   Encounter for fitting and adjustment of hearing aid 12/21/2020   Other specified problems related to psychosocial circumstances 11/21/2020   Sensorineural hearing loss, bilateral 11/21/2020   Tobacco dependence 10/06/2020   Benign prostatic hyperplasia with urinary obstruction 10/06/2020   Above knee amputation of left lower extremity ([place]) 10/06/2020   Other specified counseling 10/06/2020   Severe sepsis ([place]) 09/24/2020   Schizophrenia, unspecified ([place]) 09/24/2020   Unspecified dementia, unspecified severity, without behavioral disturbance, psychotic disturbance, mood disturbance, and anxiety ([place]) 09/24/2020   Lung nodule 09/24/2020   Indwelling Foley catheter present 09/24/2020   Peripheral vascular disease ([place]) 09/24/2020   Mass of testicle 09/24/2020   Problem related to unspecified psychosocial circumstances 08/01/2020   Hypermetropia 07/22/2020   Unspecified atrial fibrillation ([place]) 07/22/2020   Klebsiella sepsis ([place]) 07/22/2020   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   SIRS (systemic inflammatory response syndrome) ([place]) 16/10/9602   Acute metabolic encephalopathy 54/09/8117   Hyponatremia    S/P AKA (above knee amputation) unilateral, left ([place]) 06/28/2020   Gangrene of left foot ([place]) 06/28/2020   Type 2 DM  with diabetic peripheral angiopathy w/o gangrene ([place]) 05/27/2020   Critical lower limb ischemia ([place]) 05/21/2020   Malnutrition ([place]) 05/19/2020   Plantar callus 12/24/2019    Cavitary pneumonia 09/14/2019   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   Unintentional weight loss 09/14/2019   Coronary artery calcification seen on CT scan 10/28/2018   Hilar adenopathy 04/10/2016   Lung nodule seen on imaging study 07/09/2015   Acute encephalopathy    Hypoglycemia due to insulin    Type 2 diabetes mellitus with other circulatory complications ([place]) 79/02/4095   Acute kidney injury superimposed on chronic kidney disease ([place]) 07/08/2015   Tobacco use disorder 07/08/2015   Diverticulosis of colon without hemorrhage    Left inguinal hernia 09/16/2013   Altered mental status 11/17/2012   Hypothermia 11/17/2012   Leukocytosis 11/17/2012   Chronic kidney disease, stage 3 unspecified ([place])    Post-traumatic stress disorder, chronic    GERD (gastroesophageal reflux disease)    Essential (primary) hypertension 09/10/2011   Anemia 09/10/2011   Diabetic neuropathy ([place]) 09/10/2011   DDD (degenerative disc disease), lumbar 09/10/2011   Psychosis ([place]) 09/10/2011   Diabetes mellitus type 2, uncontrolled 09/10/2011   Hyperlipemia 09/10/2011   Microalbuminuria 09/10/2011   2:51 PM, 03/06/21 Josue Hector PT DPT  Physical Therapist with [facility]  [phone] [place] [address] [place], [place], [address] Phone: [phone]   Fax:  [phone]  Name: JAX KENTNER MRN: [id number] Date of Birth: [birth year]

## 2021-03-13 ENCOUNTER — Other Ambulatory Visit: Payer: Self-pay

## 2021-03-13 ENCOUNTER — Ambulatory Visit (HOSPITAL_COMMUNITY): Payer: Medicare Other | Admitting: Physical Therapy

## 2021-03-13 ENCOUNTER — Other Ambulatory Visit: Payer: Self-pay | Admitting: *Deleted

## 2021-03-13 ENCOUNTER — Encounter (HOSPITAL_COMMUNITY): Payer: Self-pay | Admitting: Physical Therapy

## 2021-03-13 ENCOUNTER — Encounter: Payer: Self-pay | Admitting: *Deleted

## 2021-03-13 DIAGNOSIS — E1129 Type 2 diabetes mellitus with other diabetic kidney complication: Secondary | ICD-10-CM | POA: Diagnosis not present

## 2021-03-13 DIAGNOSIS — R2681 Unsteadiness on feet: Secondary | ICD-10-CM

## 2021-03-13 DIAGNOSIS — M6281 Muscle weakness (generalized): Secondary | ICD-10-CM

## 2021-03-13 DIAGNOSIS — D630 Anemia in neoplastic disease: Secondary | ICD-10-CM | POA: Diagnosis not present

## 2021-03-13 DIAGNOSIS — I5022 Chronic systolic (congestive) heart failure: Secondary | ICD-10-CM | POA: Diagnosis not present

## 2021-03-13 DIAGNOSIS — E875 Hyperkalemia: Secondary | ICD-10-CM | POA: Diagnosis not present

## 2021-03-13 DIAGNOSIS — R262 Difficulty in walking, not elsewhere classified: Secondary | ICD-10-CM

## 2021-03-13 DIAGNOSIS — Z79899 Other long term (current) drug therapy: Secondary | ICD-10-CM | POA: Diagnosis not present

## 2021-03-13 NOTE — Patient Outreach (Signed)
[facility]) Care Management Telephonic RN Care Manager Note   03/13/2021 Name:  Allen Yu MRN:  [id number] DOB:  [birth year]  Summary: Allen Yu returned a call to RN CM for follow outreach after a message was left She confirms she is with Allen Yu at his [facility] health outpatient rehab appointment. She provides an update on his progression.  He has made improvements and is pending a prosthesis  He continues with support of his pcp, Veteran's administration ([facility]), [place] caregiver, psychology and wife. His family (siblings, etc) are reluctant to assist in care but has visited 1-2 times since last outreach.  He is to follow up with the [place] on 03/14/21  Wife also benefits from the psychology services monthly  Urology- continues to have intact foley but with some reported penis tip irritation. Use of large foley bag at night and small leg bag during the day with elastic band Scheduled to be seen in urology office on 03/16/21  congestive Heart Failure (CHF) Did have admission for CHF. Now that wife aware of patient fluid restriction of 1 liter she is assisting patient to maintain it to prevent fluid overload   Reported to be sleeping and eating better  He continues to smoke. Not ready for smoke cessation   Denies any needs for care coordination or disease management today  Recommendations/Changes made from today's visit: Assessed for worsening symptoms Suggested leg bag with Velcro to prevent penial irritation during the day THN progression reviewed Sent referral to external care management program (pcp)   Subjective: Allen Yu is an 76 y.o. year old male who is a primary patient of [name], MD. The care management team was consulted for assistance with care management and/or care coordination needs.    Telephonic RN Care Manager completed Telephone Visit today.   Objective:  Medications Reviewed Today     Reviewed by Allen Yu, PT (Physical  Therapist) on 03/13/21 at 1310  Med List Status: <None>   Medication Order Taking? Sig Documenting Provider Last Dose Status Informant  acetaminophen (TYLENOL) 500 MG tablet [id number] No Take 1,000 mg by mouth every 6 (six) hours as needed for moderate pain. [provider] Taking Active Spouse/Significant Other  Alcohol Swabs (ALCOHOL PADS) 70 % PADS [id number] No USE 1 PAD AS DIRECTED [provider] Taking Active Spouse/Significant Other  aspirin EC 81 MG tablet [id number] No Take 1 tablet (81 mg total) by mouth daily with breakfast. Roxan Hockey, MD Taking Active Spouse/Significant Other  atorvastatin (LIPITOR) 40 MG tablet [id number] No Take 40 mg by mouth at bedtime. [provider] Taking Active Spouse/Significant Other  B-D UF III MINI PEN NEEDLES 31G X 5 MM MISC [id number] No SMARTSIG:1 Each SUB-Q Daily [provider] Taking Active Spouse/Significant Other  Bacitracin-Polymyxin B (NEOSPORIN EX) [id number] No Apply 1 application topically daily as needed (wound care). [provider] Taking Active Spouse/Significant Other  clopidogrel (PLAVIX) 75 MG tablet [id number] No Take 1 tablet (75 mg total) by mouth daily. Roxan Hockey, MD Taking Active Spouse/Significant Other  donepezil (ARICEPT) 10 MG tablet [id number] No Take 10 mg by mouth at bedtime.  [provider] Taking Active Spouse/Significant Other  feeding supplement, ENSURE COMPLETE, (ENSURE COMPLETE) LIQD [id number] No Take 237 mLs by mouth 2 (two) times daily between meals. Roxan Hockey, MD Taking Active Spouse/Significant Other  gabapentin (NEURONTIN) 100 MG capsule [id number] No Take 100 mg by mouth at bedtime. [provider] Taking Active Spouse/Significant Other  LANTUS 100  UNIT/ML injection [id number] No Inject 0.06 mLs (6 Units total) into the skin at bedtime as needed (High blood glucose). If Blood glucose over 200 Emokpae, Courage, MD Taking Active  Spouse/Significant Other  losartan (COZAAR) 50 MG tablet [id number] No Take 0.5 tablets (25 mg total) by mouth daily. Barton Dubois, MD Taking Active   metoprolol succinate (TOPROL-XL) 50 MG 24 hr tablet [id number] No Take 1 tablet (50 mg total) by mouth daily. Take with or immediately following a meal. Erma Heritage, PA-C Taking Expired 02/27/21 2359 Spouse/Significant Other  mirtazapine (REMERON) 15 MG tablet [id number] No Take 0.5 tablets (7.5 mg total) by mouth at bedtime. Roxan Hockey, MD Taking Active Spouse/Significant Other  Multiple Vitamins-Minerals (MULTIVITAMIN WITH MINERALS) tablet [id number] No Take 1 tablet by mouth daily. Roxan Hockey, MD Taking Active Spouse/Significant Other  ondansetron (ZOFRAN) 4 MG tablet [id number] No Take 1 tablet (4 mg total) by mouth every 6 (six) hours as needed for nausea. Roxan Hockey, MD Taking Active Spouse/Significant Other  risperiDONE (RISPERDAL) 0.5 MG tablet [id number] No Take 1 tablet (0.5 mg total) by mouth every 12 (twelve) hours as needed (Restlessness and agitation). Roxan Hockey, MD Taking Active Spouse/Significant Other  torsemide (DEMADEX) 20 MG tablet [id number] No Take 1.5 tablets (30 mg total) by mouth daily. Barton Dubois, MD Taking Active   traMADol Veatrice Bourbon) 50 MG tablet [id number] No Take 50 mg by mouth every 6 (six) hours as needed for moderate pain. [provider] Taking Active Spouse/Significant Other  Vitamin D, Ergocalciferol, (DRISDOL) 1.25 MG (50000 UNIT) CAPS capsule [id number] No Take 50,000 Units by mouth every Friday. [provider] Taking Active Spouse/Significant Other  Med List Note Jolayne Haines, CPhT 07/09/16 1537): Dr. [name] [phone]             SDOH:  (Social Determinants of Health) assessments and interventions performed:  SDOH Interventions    Flowsheet Row Most Recent Value  SDOH Interventions   Food Insecurity Interventions Intervention Not Indicated   Financial Strain Interventions Intervention Not Indicated  Housing Interventions Intervention Not Indicated  Intimate Partner Violence Interventions Intervention Not Indicated  Stress Interventions Intervention Not Indicated  Social Connections Interventions Intervention Not Indicated  Transportation Interventions Intervention Not Indicated       Care Plan  Review of patient past medical history, allergies, medications, health status, including review of consultants reports, laboratory and other test data, was performed as part of comprehensive evaluation for care management services.   Care Plan : Wellness (Adult)  Updates made by Barbaraann Faster, RN since 03/13/2021 12:00 AM  Completed 03/13/2021   Care Plan : General Plan of Care (Adult)  Updates made by Barbaraann Faster, RN since 03/13/2021 12:00 AM  Completed 03/13/2021   Care Plan : Diabetes Type 2 (Adult)  Updates made by Barbaraann Faster, RN since 03/13/2021 12:00 AM  Completed 03/13/2021   Care Plan : Urinary Incontinence (Adult)  Updates made by Barbaraann Faster, RN since 03/13/2021 12:00 AM  Completed 03/13/2021   Care Plan : RN Care Manager Plan of Care  Updates made by Barbaraann Faster, RN since 03/13/2021 12:00 AM     Problem: Complex Care Coordination Needs and disease management in patient with DM II, CKD 3, Afib, PTSD   Priority: High     Long-Range Goal: Establish Plan of Care for Management Complex SDOH Barriers, disease management and Care Coordination Needs in patient with DM II, CKD 3, Afib, PTSD   Start Date: 12/21/2020  This  Visit's Progress: On track  Recent Progress: On track  Priority: High  Note:   Current Barriers:  Knowledge Deficits related to plan of care for management of Atrial Fibrillation, DMII, CKD Stage 3, and PTSD/dementia  Care Coordination needs related to Lacks knowledge of community resource: VA benefits Health behaviors, bilateral hearing (12/120/22 now has hearing aides), [place]  Veteran with PTSD/psychosis and reported psychosocial population concerns, memory concerns   RN CM Clinical Goal(s):  Patient will work with [employer] administration (VA) benefit providers to continue home management of DM II, Afib, CKD, PTSD/dementia as evidenced by decrease in admissions and improved lab values, behavior  through collaboration with Consulting civil engineer, provider, and care team.   Interventions: Follow up outreaches to wife and patient as allowed to further assess and assist with resources/guidance, care coordination and/or disease management/education  Inter-disciplinary care team collaboration (see longitudinal plan of care) Evaluation of current treatment plan related to  self management and patient's adherence to plan as established by provider 01/10/21 care coordination completed for home assistance with catheter change monthly no available via home health per medicare guideline changes (not skilled need) -RN CM outreach to [facility] staff, updated wife 01/10/21 Confirmed with wife further rehabilitation outpatient near [facility] ordered by pcp.   03/13/21 wife reports improvements with outpatient therapy, pending prosthetic, better appetite, better sleeping continues with [facility] home care services, psychology services, urology services- pt continues to smoke but not interested in smoke cessation   AFIB Interventions: (Status:  Goal on track:  Yes.) Long Term Goal   Reviewed importance of adherence to anticoagulant exactly as prescribed    Chronic Kidney Disease Interventions:  (Status:  Goal on track:  Yes.) Long Term Goal Evaluation of current treatment plan related to chronic kidney disease self management and patient's adherence to plan as established by provider      Engage patient in early, proactive and ongoing discussion about goals of care and what matters most to them    Suggested leg bag with Velcro to prevent penial irritation during the day  Last  practice recorded BP readings:  BP Readings from Last 3 Encounters:  02/28/21 138/75  02/24/21 115/62  01/25/21 113/67  Most recent eGFR/CrCl: No results found for: EGFR  No components found for: CRCL    Diabetes Interventions:  (Status:  Goal on track:  Yes.) Long Term Goal Assessed patient's understanding of A1c goal: <7% Discussed plans with patient for ongoing care management follow up and provided patient with direct contact information for care management team Review of patient status, including review of consultants reports, relevant laboratory and other test results, and medications completed Lab Results  Component Value Date   HGBA1C 8.8 (H) 01/14/2021   Dementia:  (Status:  Goal on track:  Yes.)  Long Term Goal Evaluation of current treatment plan related to misuse of: Dementia with other behavioral disturbances Collaborated with VA personal care services, psychology services, pcp, outpatient rehab services, Emotional Support Provided to patient/caregiver, Discussed importance of attendance to all provider appointments, and Advised to contact provider for new or worsening symptoms  Patient Goals/Self-Care Activities: Take all medications as prescribed Attend all scheduled provider appointments Call provider office for new concerns or questions   Follow Up Plan:  The patient has been provided with contact information for the care management team and has been advised to call with any health related questions or concerns.  The care management team will reach out to the patient again over the next  60 business days.       Plan: The patient has been provided with contact information for the care management team and has been advised to call with any health related questions or concerns.  The care management team will reach out to the patient again over the next 60+ business days.  Oline Belk L. [name], RN, BSN, [place] Coordinator Office number  [phone] Main [facility] number [phone] Fax number [phone]

## 2021-03-13 NOTE — Patient Outreach (Signed)
[facility]) Care Management  03/13/2021  Allen Yu September 04, 1945 [id number]   [facility] Unsuccessful outreach   Outreach attempt to the listed at the preferred outreach number in EPIC  No answer. [facility] RN CM left HIPAA [facility] Portability and Accountability Act) compliant voicemail message along with CMs contact info.   Plan: [facility] RN CM scheduled this patient for another call attempt within 4-7 business days Unsuccessful outreach on 03/13/21   Allen Wargo L. [name], RN, BSN, [place] Coordinator Office number [phone]

## 2021-03-13 NOTE — Therapy (Signed)
[facility], [place], [address] Phone: [phone]   Fax:  [phone]  Physical Therapy Treatment  Patient Details  Name: Allen Yu MRN: [id number] Date of Birth: [birth year] Referring Provider (PT): Nita Sickle, MD   Encounter Date: 03/13/2021   PT End of Session - 03/13/21 1310     Visit Number 8    Number of Visits 15    Date for PT Re-Evaluation 04/20/21    Authorization Type Medicare Part A and B; Tric[employer] for Life    PT Start Time 1304    PT Stop Time 1345    PT Time Calculation (min) 41 min    Activity Tolerance Patient tolerated treatment well;Patient limited by fatigue    Behavior During Therapy [facility] for tasks assessed/performed             Past Medical History:  Diagnosis Date   Anemia    Arthritis    Carotid stenosis    Chronic back pain    Chronic HFrEF (heart failure with reduced ejection fraction) ([place])    a. 09/2020 Echo: EF 45%, mild LVH; b. 12/2020 Echo: EF 25-30%, glob HK, mod LVH, mildly reduced RV fxn, RVSP 65.57mmHg, mod BAE. Triv effusion. Mod MR/TR.   CKD (chronic kidney disease), stage III ([place])    Constipation    Dementia ([place])    Diabetes mellitus    Dilated cardiomyopathy ([place])    a. 09/2020 Echo: EF 45%; b. 12/2020 Echo: EF 25-30%.   GERD (gastroesophageal reflux disease)    History of kidney stones    Hypertension    Lung nodule    PAF (paroxysmal atrial fibrillation) (HCC)    Peripheral neuropathy    Peripheral vascular disease ([place])    a. 06/2020 s/p L AKA; b. 10/2020 s/p R SFA and above-knee popliteal PTA.   PTSD (post-traumatic stress disorder)    Schizophrenia ([place])    Tuberculosis    Treated    Past Surgical History:  Procedure Laterality Date   ABDOMINAL AORTOGRAM W/LOWER EXTREMITY N/A 05/20/2020   Procedure: ABDOMINAL AORTOGRAM W/LOWER EXTREMITY;  Surgeon: Elam Dutch, MD;  Location: [facility];  Service: Cardiovascular;  Laterality: N/A;    ABDOMINAL AORTOGRAM W/LOWER EXTREMITY N/A 10/27/2020   Procedure: ABDOMINAL AORTOGRAM W/LOWER EXTREMITY;  Surgeon: Marty Heck, MD;  Location: [facility];  Service: Cardiovascular;  Laterality: N/A;   AMPUTATION Left 06/28/2020   Procedure: AMPUTATION ABOVE KNEE LEFT;  Surgeon: Rosetta Posner, MD;  Location: [facility] OR;  Service: Vascular;  Laterality: Left;   [place], 2013   x2   BLADDER SURGERY     02   CHOLECYSTECTOMY     COLONOSCOPY     COLONOSCOPY N/A 12/07/2014   Procedure: COLONOSCOPY;  Surgeon: Daneil Dolin, MD;  [facility];  Service: Endoscopy;  Laterality: N/A;  10:30 Am   EYE SURGERY Bilateral    removed metal from eye   FEMORAL-TIBIAL BYPASS GRAFT Left 05/27/2020   Procedure: LEFT FEMORAL TO PERONEAL ARTERY BYPASS;  Surgeon: Rosetta Posner, MD;  Location: [facility];  Service: Vascular;  Laterality: Left;   HERNIA REPAIR Right    INGUINAL HERNIA REPAIR Left 11/03/2013   Procedure: HERNIA REPAIR INGUINAL ADULT;  Surgeon: Gayland Curry, MD;  Location: [facility];  Service: General;  Laterality: Left;   PERIPHERAL VASCULAR INTERVENTION  10/27/2020   Procedure: PERIPHERAL VASCULAR INTERVENTION;  Surgeon: Marty Heck, MD;  Location: [facility] [facility];  Service: Cardiovascular;;   SHOULDER SURGERY     RIGHT SHOULDER    VIDEO BRONCHOSCOPY WITH ENDOBRONCHIAL NAVIGATION N/A 07/10/2019   Procedure: VIDEO BRONCHOSCOPY WITH ENDOBRONCHIAL NAVIGATION;  Surgeon: Melrose Nakayama, MD;  Location: [place] [facility];  Service: Thoracic;  Laterality: N/A;    There were no vitals filed for this visit.   Subjective Assessment - 03/13/21 1310     Subjective Patient states he fell trying to pick up a cigarette. His arms are sore today.    Patient is accompained by: --   PCA   Currently in Pain? Yes    Pain Score 10-Worst pain ever                               [facility] Adult PT Treatment/Exercise - 03/13/21 0001       Knee/Hip  Exercises: Seated   Long Arc Quad Strengthening;Right;3 sets;10 reps    Long Arc Quad Weight 3 lbs.    Other Seated Knee/Hip Exercises GTB rows 3 x 10, palloff press GTB 2 x 10 each,  HS curls GTB 3 x 10, arm curls 5lb 3 x 10    Other Seated Knee/Hip Exercises 3/3 UBE lv 1 for dynamic UE warmup, GTB hamstring curl GTB 3 x10, seated med ball reach for core and balance 4 min with red and green ball, reaching all directions                       PT Short Term Goals - 02/13/21 1450       PT SHORT TERM GOAL #1   Title Demonstrate bed mobility with modified independence to reduce level of assistance    Baseline min-mod A    Time 6    Period Weeks    Status On-going    Target Date 03/09/21      PT SHORT TERM GOAL #2   Title Sit to stand transfer with min A with RW to improve standing tolerance    Baseline max A with HHA    Time 6    Period Weeks    Status On-going    Target Date 03/09/21      PT SHORT TERM GOAL #3   Title Squat-pivot transfer min A to improve safety with maneuvering in small spaces    Baseline max A    Time 6    Period Weeks    Status On-going    Target Date 03/09/21      PT SHORT TERM GOAL #4   Title Demo RLE 4/5 to prepare for gait/transfers    Baseline 3/5    Time 6    Period Weeks    Status On-going    Target Date 03/09/21               PT Long Term Goals - 01/26/21 1457       PT LONG TERM GOAL #1   Title Ambulate x 25 ft with RW with/without prosthetic to improve gait tolerance    Baseline unable    Time 12    Period Weeks    Status New    Target Date 04/20/21      PT LONG TERM GOAL #2   Title Stand-pivot transfer with supervision to increase functional independence    Baseline max A    Time 12    Period Weeks  Status New    Target Date 04/20/21                   Plan - 03/13/21 1341     Clinical Impression Statement Patient tolerated session well today. Added seated medicine ball reached to bounds of base  of support for increased core strength and balance with reaching for objects. Educated patient and PCA on purpose and function of additional activity. Patient noting mild fatigue and given several breaks for rest throughout session. Patient will continue to benefit from skilled therapy services to reduce deficits and improve functional level.    Personal Factors and Comorbidities Age;Comorbidity 3+;Time since onset of injury/illness/exacerbation;Fitness    Comorbidities dementia, CKD, DM    Examination-Activity Limitations Bed Mobility;Lift;Toileting;Stand;Reach Overhead;Locomotion Level;Transfers    Stability/Clinical Decision Making Evolving/Moderate complexity    Rehab Potential Fair    PT Frequency 1x / week    PT Duration 12 weeks    PT Treatment/Interventions ADLs/Self Care Home Management;DME Instruction;Gait training;Functional mobility training;Therapeutic activities;Therapeutic exercise;Balance training;Prosthetic Training;Patient/family education;Neuromuscular re-education;Wheelchair mobility training;Manual techniques;Taping    PT Next Visit Plan Progress general strength and condition for functional mobility and transfers    PT Home Exercise Plan left hip flexor stretch (Thomas test position), hip extension isometric, RLE SLR; 19 - SAQ, LAQ 1/30 band rows seated 2/6 prone laying hip flexor stretch 2/13 hamstring curls    Consulted and Agree with Plan of Care Patient;Family member/caregiver    Family Member Consulted PCA             Patient will benefit from skilled therapeutic intervention in order to improve the following deficits and impairments:  Decreased activity tolerance, Decreased balance, Decreased mobility, Decreased knowledge of use of DME, Decreased endurance, Decreased range of motion, Decreased strength, Difficulty walking, Impaired flexibility, Decreased safety awareness  Visit Diagnosis: Muscle weakness (generalized)  Difficulty in walking, not elsewhere  classified  Unsteadiness on feet     Problem List Patient Active Problem List   Diagnosis Date Noted   Acute respiratory failure with hypoxia and hypercarbia (HCC) 01/23/2021   CKD (chronic kidney disease) stage 3, GFR 30-59 ml/min (HCC) 01/23/2021   Acute on chronic systolic CHF (congestive heart failure) ([place]) 01/23/2021   Acute respiratory failure with hypoxia ([place]) 01/13/2021   Encounter for fitting and adjustment of hearing aid 12/21/2020   Other specified problems related to psychosocial circumstances 11/21/2020   Sensorineural hearing loss, bilateral 11/21/2020   Tobacco dependence 10/06/2020   Benign prostatic hyperplasia with urinary obstruction 10/06/2020   Above knee amputation of left lower extremity ([place]) 10/06/2020   Other specified counseling 10/06/2020   Severe sepsis ([place]) 09/24/2020   Schizophrenia, unspecified ([place]) 09/24/2020   Unspecified dementia, unspecified severity, without behavioral disturbance, psychotic disturbance, mood disturbance, and anxiety ([place]) 09/24/2020   Lung nodule 09/24/2020   Indwelling Foley catheter present 09/24/2020   Peripheral vascular disease ([place]) 09/24/2020   Mass of testicle 09/24/2020   Problem related to unspecified psychosocial circumstances 08/01/2020   Hypermetropia 07/22/2020   Unspecified atrial fibrillation ([place]) 07/22/2020   Klebsiella sepsis ([place]) 07/22/2020   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   SIRS (systemic inflammatory response syndrome) ([place]) 14/78/2956   Acute metabolic encephalopathy 21/30/8657   Hyponatremia    S/P AKA (above knee amputation) unilateral, left ([place]) 06/28/2020   Gangrene of left foot ([place]) 06/28/2020   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 ([place]) 05/27/2020   Critical lower limb ischemia ([place]) 05/21/2020   Malnutrition ([place]) 05/19/2020  Plantar callus 12/24/2019   Cavitary pneumonia 09/14/2019   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   Unintentional weight loss  09/14/2019   Coronary artery calcification seen on CT scan 10/28/2018   Hilar adenopathy 04/10/2016   Lung nodule seen on imaging study 07/09/2015   Acute encephalopathy    Hypoglycemia due to insulin    Type 2 diabetes mellitus with other circulatory complications ([place]) 70/62/3762   Acute kidney injury superimposed on chronic kidney disease ([place]) 07/08/2015   Tobacco use disorder 07/08/2015   Diverticulosis of colon without hemorrhage    Left inguinal hernia 09/16/2013   Altered mental status 11/17/2012   Hypothermia 11/17/2012   Leukocytosis 11/17/2012   Chronic kidney disease, stage 3 unspecified ([place])    Post-traumatic stress disorder, chronic    GERD (gastroesophageal reflux disease)    Essential (primary) hypertension 09/10/2011   Anemia 09/10/2011   Diabetic neuropathy ([place]) 09/10/2011   DDD (degenerative disc disease), lumbar 09/10/2011   Psychosis ([place]) 09/10/2011   Diabetes mellitus type 2, uncontrolled 09/10/2011   Hyperlipemia 09/10/2011   Microalbuminuria 09/10/2011   1:44 PM, 03/13/21 Josue Hector PT DPT  Physical Therapist with [facility]  [phone] [place] [address] [place], [place], [address] Phone: [phone]   Fax:  [phone]  Name: ARLEE SANTOSUOSSO MRN: [id number] Date of Birth: 07-20-1945

## 2021-03-17 DIAGNOSIS — E875 Hyperkalemia: Secondary | ICD-10-CM | POA: Diagnosis not present

## 2021-03-17 DIAGNOSIS — I5040 Unspecified combined systolic (congestive) and diastolic (congestive) heart failure: Secondary | ICD-10-CM | POA: Diagnosis not present

## 2021-03-17 DIAGNOSIS — D638 Anemia in other chronic diseases classified elsewhere: Secondary | ICD-10-CM | POA: Diagnosis not present

## 2021-03-17 DIAGNOSIS — N183 Chronic kidney disease, stage 3 unspecified: Secondary | ICD-10-CM | POA: Diagnosis not present

## 2021-03-20 ENCOUNTER — Ambulatory Visit (HOSPITAL_COMMUNITY): Payer: Medicare Other | Admitting: Physical Therapy

## 2021-03-20 ENCOUNTER — Encounter (HOSPITAL_COMMUNITY): Payer: Self-pay | Admitting: Physical Therapy

## 2021-03-20 ENCOUNTER — Other Ambulatory Visit: Payer: Self-pay

## 2021-03-20 DIAGNOSIS — M6281 Muscle weakness (generalized): Secondary | ICD-10-CM | POA: Diagnosis not present

## 2021-03-20 DIAGNOSIS — R2681 Unsteadiness on feet: Secondary | ICD-10-CM

## 2021-03-20 DIAGNOSIS — R262 Difficulty in walking, not elsewhere classified: Secondary | ICD-10-CM

## 2021-03-20 NOTE — Therapy (Signed)
[facility] [place], [place], [address] Phone: [phone]   Fax:  [phone]  Physical Therapy Treatment  Patient Details  Name: Allen Yu MRN: [id number] Date of Birth: [birth year] Referring Provider (PT): Nita Sickle, MD   Encounter Date: 03/20/2021   PT End of Session - 03/20/21 1313     Visit Number 9    Number of Visits 15    Date for PT Re-Evaluation 04/20/21    Authorization Type Medicare Part A and B; Tricare for Life    PT Start Time 1304    PT Stop Time 1345    PT Time Calculation (min) 41 min    Activity Tolerance Patient tolerated treatment well;Patient limited by fatigue    Behavior During Therapy [facility] for tasks assessed/performed             Past Medical History:  Diagnosis Date   Anemia    Arthritis    Carotid stenosis    Chronic back pain    Chronic HFrEF (heart failure with reduced ejection fraction) ([place])    a. 09/2020 Echo: EF 45%, mild LVH; b. 12/2020 Echo: EF 25-30%, glob HK, mod LVH, mildly reduced RV fxn, RVSP 65.12mmHg, mod BAE. Triv effusion. Mod MR/TR.   CKD (chronic kidney disease), stage III ([place])    Constipation    Dementia ([place])    Diabetes mellitus    Dilated cardiomyopathy ([place])    a. 09/2020 Echo: EF 45%; b. 12/2020 Echo: EF 25-30%.   GERD (gastroesophageal reflux disease)    History of kidney stones    Hypertension    Lung nodule    PAF (paroxysmal atrial fibrillation) (HCC)    Peripheral neuropathy    Peripheral vascular disease ([place])    a. 06/2020 s/p L AKA; b. 10/2020 s/p R SFA and above-knee popliteal PTA.   PTSD (post-traumatic stress disorder)    Schizophrenia ([place])    Tuberculosis    Treated    Past Surgical History:  Procedure Laterality Date   ABDOMINAL AORTOGRAM W/LOWER EXTREMITY N/A 05/20/2020   Procedure: ABDOMINAL AORTOGRAM W/LOWER EXTREMITY;  Surgeon: Elam Dutch, MD;  Location: [facility];  Service: Cardiovascular;  Laterality: N/A;    ABDOMINAL AORTOGRAM W/LOWER EXTREMITY N/A 10/27/2020   Procedure: ABDOMINAL AORTOGRAM W/LOWER EXTREMITY;  Surgeon: Marty Heck, MD;  Location: [facility];  Service: Cardiovascular;  Laterality: N/A;   AMPUTATION Left 06/28/2020   Procedure: AMPUTATION ABOVE KNEE LEFT;  Surgeon: Rosetta Posner, MD;  Location: [facility];  Service: Vascular;  Laterality: Left;   [place], 2013   x2   BLADDER SURGERY     02   CHOLECYSTECTOMY     COLONOSCOPY     COLONOSCOPY N/A 12/07/2014   Procedure: COLONOSCOPY;  Surgeon: Daneil Dolin, MD;  [facility];  Service: Endoscopy;  Laterality: N/A;  10:30 Am   EYE SURGERY Bilateral    removed metal from eye   FEMORAL-TIBIAL BYPASS GRAFT Left 05/27/2020   Procedure: LEFT FEMORAL TO PERONEAL ARTERY BYPASS;  Surgeon: Rosetta Posner, MD;  Location: [facility];  Service: Vascular;  Laterality: Left;   HERNIA REPAIR Right    INGUINAL HERNIA REPAIR Left 11/03/2013   Procedure: HERNIA REPAIR INGUINAL ADULT;  Surgeon: Gayland Curry, MD;  Location: [facility];  Service: General;  Laterality: Left;   PERIPHERAL VASCULAR INTERVENTION  10/27/2020   Procedure: PERIPHERAL VASCULAR INTERVENTION;  Surgeon: Marty Heck, MD;  Location: [facility];  Service: Cardiovascular;;   SHOULDER SURGERY     RIGHT SHOULDER    VIDEO BRONCHOSCOPY WITH ENDOBRONCHIAL NAVIGATION N/A 07/10/2019   Procedure: VIDEO BRONCHOSCOPY WITH ENDOBRONCHIAL NAVIGATION;  Surgeon: Melrose Nakayama, MD;  Location: [facility];  Service: Thoracic;  Laterality: N/A;    There were no vitals filed for this visit.   Subjective Assessment - 03/20/21 1311     Subjective Patient had follow up with [place] doctor last week. They said they want him to strengthening his leg more and he is not a candidate for prosthetic right now because he is not strong enough    Patient is accompained by: --   PCA   Currently in Pain? Yes    Pain Score 10-Worst pain ever    Pain Location --    all over                              [facility] Adult PT Treatment/Exercise - 03/20/21 0001       Knee/Hip Exercises: Seated   Other Seated Knee/Hip Exercises GTB rows 3 x 10, palloff press GTB 2 x 15 each,  HS curls GTB 3 x 10, LAQs 3 x 10 3lb    Other Seated Knee/Hip Exercises 2/2 UBE lv 1 for dynamic UE warmup    Sit to Sand 5 reps;with UE support   Mod A, using RW                      PT Short Term Goals - 02/13/21 1450       PT SHORT TERM GOAL #1   Title Demonstrate bed mobility with modified independence to reduce level of assistance    Baseline min-mod A    Time 6    Period Weeks    Status On-going    Target Date 03/09/21      PT SHORT TERM GOAL #2   Title Sit to stand transfer with min A with RW to improve standing tolerance    Baseline max A with HHA    Time 6    Period Weeks    Status On-going    Target Date 03/09/21      PT SHORT TERM GOAL #3   Title Squat-pivot transfer min A to improve safety with maneuvering in small spaces    Baseline max A    Time 6    Period Weeks    Status On-going    Target Date 03/09/21      PT SHORT TERM GOAL #4   Title Demo RLE 4/5 to prepare for gait/transfers    Baseline 3/5    Time 6    Period Weeks    Status On-going    Target Date 03/09/21               PT Long Term Goals - 01/26/21 1457       PT LONG TERM GOAL #1   Title Ambulate x 25 ft with RW with/without prosthetic to improve gait tolerance    Baseline unable    Time 12    Period Weeks    Status New    Target Date 04/20/21      PT LONG TERM GOAL #2   Title Stand-pivot transfer with supervision to increase functional independence    Baseline max A    Time 12    Period  Weeks    Status New    Target Date 04/20/21                   Plan - 03/20/21 1402     Clinical Impression Statement Patient tolerated session well today. Able to progress to standing from elevated mat with Mod A using RW for UE support.  Patient cued on hand placement and to extend fully through RT knee when standing for improved quadricep activation. Patient noting mild fatigue at end of session. Patient will continue to benefit from skilled therapy services to reduce deficits and improve functional ability.    Personal Factors and Comorbidities Age;Comorbidity 3+;Time since onset of injury/illness/exacerbation;Fitness    Comorbidities dementia, CKD, DM    Examination-Activity Limitations Bed Mobility;Lift;Toileting;Stand;Reach Overhead;Locomotion Level;Transfers    Stability/Clinical Decision Making Evolving/Moderate complexity    Rehab Potential Fair    PT Frequency 1x / week    PT Duration 12 weeks    PT Treatment/Interventions ADLs/Self Care Home Management;DME Instruction;Gait training;Functional mobility training;Therapeutic activities;Therapeutic exercise;Balance training;Prosthetic Training;Patient/family education;Neuromuscular re-education;Wheelchair mobility training;Manual techniques;Taping    PT Next Visit Plan Progress general strength and condition for functional mobility and transfers    PT Home Exercise Plan left hip flexor stretch (Thomas test position), hip extension isometric, RLE SLR; 19 - SAQ, LAQ 1/30 band rows seated 2/6 prone laying hip flexor stretch 2/13 hamstring curls    Consulted and Agree with Plan of Care Patient;Family member/caregiver    Family Member Consulted PCA             Patient will benefit from skilled therapeutic intervention in order to improve the following deficits and impairments:  Decreased activity tolerance, Decreased balance, Decreased mobility, Decreased knowledge of use of DME, Decreased endurance, Decreased range of motion, Decreased strength, Difficulty walking, Impaired flexibility, Decreased safety awareness  Visit Diagnosis: Muscle weakness (generalized)  Difficulty in walking, not elsewhere classified  Unsteadiness on feet     Problem List Patient Active  Problem List   Diagnosis Date Noted   Acute respiratory failure with hypoxia and hypercarbia (HCC) 01/23/2021   CKD (chronic kidney disease) stage 3, GFR 30-59 ml/min (HCC) 01/23/2021   Acute on chronic systolic CHF (congestive heart failure) ([place]) 01/23/2021   Acute respiratory failure with hypoxia ([place]) 01/13/2021   Encounter for fitting and adjustment of hearing aid 12/21/2020   Other specified problems related to psychosocial circumstances 11/21/2020   Sensorineural hearing loss, bilateral 11/21/2020   Tobacco dependence 10/06/2020   Benign prostatic hyperplasia with urinary obstruction 10/06/2020   Above knee amputation of left lower extremity ([place]) 10/06/2020   Other specified counseling 10/06/2020   Severe sepsis ([place]) 09/24/2020   Schizophrenia, unspecified ([place]) 09/24/2020   Unspecified dementia, unspecified severity, without behavioral disturbance, psychotic disturbance, mood disturbance, and anxiety ([place]) 09/24/2020   Lung nodule 09/24/2020   Indwelling Foley catheter present 09/24/2020   Peripheral vascular disease ([place]) 09/24/2020   Mass of testicle 09/24/2020   Problem related to unspecified psychosocial circumstances 08/01/2020   Hypermetropia 07/22/2020   Unspecified atrial fibrillation ([place]) 07/22/2020   Klebsiella sepsis ([place]) 07/22/2020   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   SIRS (systemic inflammatory response syndrome) ([place]) 97/35/3299   Acute metabolic encephalopathy 24/26/8341   Hyponatremia    S/P AKA (above knee amputation) unilateral, left ([place]) 06/28/2020   Gangrene of left foot ([place]) 06/28/2020   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 ([place]) 05/27/2020   Critical lower limb ischemia ([place]) 05/21/2020   Malnutrition ([place]) 05/19/2020  Plantar callus 12/24/2019   Cavitary pneumonia 09/14/2019   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   Unintentional weight loss 09/14/2019   Coronary artery calcification seen on CT scan 10/28/2018    Hilar adenopathy 04/10/2016   Lung nodule seen on imaging study 07/09/2015   Acute encephalopathy    Hypoglycemia due to insulin    Type 2 diabetes mellitus with other circulatory complications ([place]) 91/50/5697   Acute kidney injury superimposed on chronic kidney disease ([place]) 07/08/2015   Tobacco use disorder 07/08/2015   Diverticulosis of colon without hemorrhage    Left inguinal hernia 09/16/2013   Altered mental status 11/17/2012   Hypothermia 11/17/2012   Leukocytosis 11/17/2012   Chronic kidney disease, stage 3 unspecified ([place])    Post-traumatic stress disorder, chronic    GERD (gastroesophageal reflux disease)    Essential (primary) hypertension 09/10/2011   Anemia 09/10/2011   Diabetic neuropathy ([place]) 09/10/2011   DDD (degenerative disc disease), lumbar 09/10/2011   Psychosis ([place]) 09/10/2011   Diabetes mellitus type 2, uncontrolled 09/10/2011   Hyperlipemia 09/10/2011   Microalbuminuria 09/10/2011   2:05 PM, 03/20/21 Josue Hector PT DPT  Physical Therapist with [facility]  [phone] [place] [address] [place], [place], [address] Phone: [phone]   Fax:  [phone]  Name: Allen Yu MRN: [id number] Date of Birth: Jul 15, 1945

## 2021-03-22 ENCOUNTER — Telehealth: Payer: Self-pay | Admitting: Cardiology

## 2021-03-22 DIAGNOSIS — I10 Essential (primary) hypertension: Secondary | ICD-10-CM

## 2021-03-22 DIAGNOSIS — Z79899 Other long term (current) drug therapy: Secondary | ICD-10-CM

## 2021-03-22 MED ORDER — LOSARTAN POTASSIUM 50 MG PO TABS: 50.0000 mg | ORAL_TABLET | Freq: Every day | ORAL | 3 refills | Status: DC

## 2021-03-22 NOTE — Telephone Encounter (Signed)
Spoke with wife notified of medication change and the need to have labs done in 2 weeks. Wife voiced understanding. Orders placed.  ?

## 2021-03-22 NOTE — Telephone Encounter (Signed)
? ?  Based off his readings, would have them increase his Losartan to 50mg  daily and repeat BMET in 2 weeks for follow-up of renal function and electrolytes. His current readings are not as elevated as you would expect to cause vision changes but if symptoms do not improve or he develops significant headaches, slurred speech or new weakness along his extremities, then he should seek emergent evaluation. He can take an extra 10mg  of Torsemide for a few days to see if that helps with his swelling (listed as taking 30mg  so could take 40mg  for several days). ? ?Signed, ?Erma Heritage, PA-C ?03/22/2021, 4:28 PM ? ?  ?

## 2021-03-22 NOTE — Telephone Encounter (Signed)
Spoke to pt's spouse who stated that pt is c/o elevated bps and blurred vision. Pt has a hx of cataracts.Pt also c/o swollen right foot- has been keeping it elevated, and a dry cough. Pt's spouse stated that she has been limiting pt's fluid intake. Pt is complaint with Toprol-Xl 50 mg po qd and Losartan 25 mg po qd.  ?Please advise.   ?

## 2021-03-22 NOTE — Telephone Encounter (Signed)
?  Pt c/o BP issue: STAT if pt c/o blurred vision, one-sided weakness or slurred speech ? ?1. What are your last 5 BP readings?  ? ?Feb 21st: 167/80 HR 80 ?Feb 27th: 185/88 HR 84 ?Feb 28th: 129/106 HR 74 ?March 1st: 162/84 HR 80 ? ?2. Are you having any other symptoms (ex. Dizziness, headache, blurred vision, passed out)? Blurred vision but pt does have cataracts ? ?3. What is your BP issue? Elevated BP, unsure if the patient needs to have his medication adjusted.   ? ? ?Reached out to the nurses at [facility] office to see if anyone was available to speak with the wife due to patient having blurred vision. Advised by Caryl Pina, CMA to send to triage as they were in clinic right now.  ?

## 2021-03-27 ENCOUNTER — Ambulatory Visit (HOSPITAL_COMMUNITY): Payer: Medicare Other | Attending: Surgical | Admitting: Physical Therapy

## 2021-03-27 ENCOUNTER — Encounter (HOSPITAL_COMMUNITY): Payer: Self-pay | Admitting: Physical Therapy

## 2021-03-27 ENCOUNTER — Other Ambulatory Visit: Payer: Self-pay

## 2021-03-27 DIAGNOSIS — R2681 Unsteadiness on feet: Secondary | ICD-10-CM | POA: Diagnosis present

## 2021-03-27 DIAGNOSIS — M6281 Muscle weakness (generalized): Secondary | ICD-10-CM | POA: Diagnosis not present

## 2021-03-27 DIAGNOSIS — R262 Difficulty in walking, not elsewhere classified: Secondary | ICD-10-CM | POA: Insufficient documentation

## 2021-03-27 NOTE — Therapy (Signed)
[facility] [address] [place], [place], [address] Phone: [phone]   Fax:  [phone]  Physical Therapy Treatment  Patient Details  Name: TREMEL SETTERS MRN: [id number] Date of Birth: 03/29/1945 Referring Provider (PT): Nita Sickle, MD  Progress Note Reporting Period 01/26/21 to 03/27/21  See note below for Objective Data and Assessment of Progress/Goals.     Encounter Date: 03/27/2021   PT End of Session - 03/27/21 1309     Visit Number 10    Number of Visits 15    Date for PT Re-Evaluation 04/20/21    Authorization Type Medicare Part A and B; T[employer] for Life    Progress Note Due on Visit 15    PT Start Time 1303    PT Stop Time 1343    PT Time Calculation (min) 40 min    Activity Tolerance Patient tolerated treatment well;Patient limited by fatigue    Behavior During Therapy [facility] for tasks assessed/performed             Past Medical History:  Diagnosis Date   Anemia    Arthritis    Carotid stenosis    Chronic back pain    Chronic HFrEF (heart failure with reduced ejection fraction) ([place])    a. 09/2020 Echo: EF 45%, mild LVH; b. 12/2020 Echo: EF 25-30%, glob HK, mod LVH, mildly reduced RV fxn, RVSP 65.83mHg, mod BAE. Triv effusion. Mod MR/TR.   CKD (chronic kidney disease), stage III ([place]    Constipation    Dementia ([place]    Diabetes mellitus    Dilated cardiomyopathy ([place]    a. 09/2020 Echo: EF 45%; b. 12/2020 Echo: EF 25-30%.   GERD (gastroesophageal reflux disease)    History of kidney stones    Hypertension    Lung nodule    PAF (paroxysmal atrial fibrillation) (HCC)    Peripheral neuropathy    Peripheral vascular disease ([place]    a. 06/2020 s/p L AKA; b. 10/2020 s/p R SFA and above-knee popliteal PTA.   PTSD (post-traumatic stress disorder)    Schizophrenia ([place]    Tuberculosis    Treated    Past Surgical History:  Procedure Laterality Date   ABDOMINAL AORTOGRAM W/LOWER EXTREMITY N/A 05/20/2020    Procedure: ABDOMINAL AORTOGRAM W/LOWER EXTREMITY;  Surgeon: FElam Dutch MD;  [facility];  Service: Cardiovascular;  Laterality: N/A;   ABDOMINAL AORTOGRAM W/LOWER EXTREMITY N/A 10/27/2020   Procedure: ABDOMINAL AORTOGRAM W/LOWER EXTREMITY;  Surgeon: CMarty Heck MD;  [facility];  Service: Cardiovascular;  Laterality: N/A;   AMPUTATION Left 06/28/2020   Procedure: AMPUTATION ABOVE KNEE LEFT;  Surgeon: ERosetta Posner MD;  [facility];  Service: Vascular;  Laterality: Left;   ADix 2013   x2   BLADDER SURGERY     02   CHOLECYSTECTOMY     COLONOSCOPY     COLONOSCOPY N/A 12/07/2014   Procedure: COLONOSCOPY;  Surgeon: RDaneil Dolin MD;  [facility];  Service: Endoscopy;  Laterality: N/A;  10:30 Am   EYE SURGERY Bilateral    removed metal from eye   FEMORAL-TIBIAL BYPASS GRAFT Left 05/27/2020   Procedure: LEFT FEMORAL TO PERONEAL ARTERY BYPASS;  Surgeon: ERosetta Posner MD;  [facility]  Service: Vascular;  Laterality: Left;   HERNIA REPAIR Right    INGUINAL HERNIA REPAIR Left 11/03/2013   Procedure: HERNIA REPAIR INGUINAL ADULT;  Surgeon: Gayland Curry, MD;  Location: [facility];  Service: General;  Laterality: Left;   PERIPHERAL VASCULAR INTERVENTION  10/27/2020   Procedure: PERIPHERAL VASCULAR INTERVENTION;  Surgeon: Marty Heck, MD;  Location: [facility] LAB;  Service: Cardiovascular;;   SHOULDER SURGERY     RIGHT SHOULDER    VIDEO BRONCHOSCOPY WITH ENDOBRONCHIAL NAVIGATION N/A 07/10/2019   Procedure: VIDEO BRONCHOSCOPY WITH ENDOBRONCHIAL NAVIGATION;  Surgeon: Melrose Nakayama, MD;  Location: [facility];  Service: Thoracic;  Laterality: N/A;    There were no vitals filed for this visit.   Subjective Assessment - 03/27/21 1307     Subjective No new issues, same ongoing chronic pain reported 10/10 "all over" " I got a metal spine"    Patient is accompained by: --   PCA   Currently  in Pain? Yes    Pain Score 10-Worst pain ever    Pain Location --   "all over"   Pain Type Chronic pain    Pain Onset More than a month ago    Pain Frequency Constant                [facility] PT Assessment - 03/27/21 0001       Assessment   Medical Diagnosis Gen. weakness' left AKA    Referring Provider (PT) Nita Sickle, MD      Balance Screen   Has the patient fallen in the past 6 months Yes    How many times? 1    Has the patient had a decrease in activity level because of a fear of falling?  No    Is the patient reluctant to leave their home because of a fear of falling?  No      Home Ecologist residence    Living Arrangements Spouse/significant other;Other relatives    Available Help at Discharge Family;Personal care attendant      Prior Function   Level of Independence Needs assistance with ADLs;Needs assistance with homemaking;Needs assistance with transfers    Vocation Retired      [employer]   Overall Cognitive Status History of cognitive impairments - at baseline      Strength   Strength Assessment Site Knee;Hip;Ankle    Right/Left Hip Right;Left    Right Hip Flexion 4-/5    Right Hip Extension 3-/5    Right Hip ABduction 3-/5    Left Hip Flexion 4-/5    Left Hip Extension 3+/5   through available motion but limited to neutral due to hip flexor tightness   Left Hip ABduction 3+/5    Right/Left Knee Right    Right Knee Flexion 4-/5    Right Knee Extension 4-/5    Right/Left Ankle Right;Left    Right Ankle Dorsiflexion 1/5      Bed Mobility   Supine to Sit Minimal Assistance - Patient > 75%    Sit to Supine Minimal Assistance - Patient > 75%      Transfers   Sit to Stand 4: Min assist;3: Mod assist    Lateral/Scoot Transfers 5: Supervision;4: Min guard                           [facility] Adult PT Treatment/Exercise - 03/27/21 0001       Knee/Hip Exercises: Seated   Other Seated Knee/Hip Exercises BTB  rows 3 x 10, palloff press BTB 3 x 10 each,  HS curls B TB 3 x  10, LAQs 3 x 10 3lb, marching RLE 3 x 10 3lb    Other Seated Knee/Hip Exercises 3/3 UBE lv 1 for dynamic UE warmup    Sit to Sand 5 reps;with UE support   min-mod A (25 sec max)                      PT Short Term Goals - 03/27/21 1332       PT SHORT TERM GOAL #1   Title Demonstrate bed mobility with modified independence to reduce level of assistance    Baseline min A    Time 6    Period Weeks    Status On-going    Target Date 03/09/21      PT SHORT TERM GOAL #2   Title Sit to stand transfer with min A with RW to improve standing tolerance    Baseline min-mod A with HHA using RW    Time 6    Period Weeks    Status On-going    Target Date 03/09/21      PT SHORT TERM GOAL #3   Title Squat-pivot transfer min A to improve safety with maneuvering in small spaces    Baseline min-mod A    Time 6    Period Weeks    Status On-going    Target Date 03/09/21      PT SHORT TERM GOAL #4   Title Demo RLE 4/5 to prepare for gait/transfers    Baseline see MMT    Time 6    Period Weeks    Status On-going    Target Date 03/09/21               PT Long Term Goals - 03/27/21 1333       PT LONG TERM GOAL #1   Title Ambulate x 25 ft with RW with/without prosthetic to improve gait tolerance    Baseline Unable, has not been fit for prosthesis    Time 12    Period Weeks    Status On-going    Target Date 04/20/21      PT LONG TERM GOAL #2   Title Stand-pivot transfer with supervision to increase functional independence    Baseline min-mod A    Time 12    Period Weeks    Status On-going    Target Date 04/20/21                   Plan - 03/27/21 1348     Clinical Impression Statement Patient showing steady progress toward therapy goals. Demos noted improvement in strength and transfer ability. Patient able to perform bed mobility and sit to stand transfers with min-mod A depending on present  level of fatigue. Patient remains limited by fatigue and LE weakness. He is unable to attempt gait training due to he does not yet have a prosthesis. Patient will continue to benefit from skilled therapy services to address remaining deficits and improve LOF with ADLs.    Personal Factors and Comorbidities Age;Comorbidity 3+;Time since onset of injury/illness/exacerbation;Fitness    Comorbidities dementia, CKD, DM    Examination-Activity Limitations Bed Mobility;Lift;Toileting;Stand;Reach Overhead;Locomotion Level;Transfers    Stability/Clinical Decision Making Evolving/Moderate complexity    Rehab Potential Fair    PT Frequency 1x / week    PT Duration 12 weeks    PT Treatment/Interventions ADLs/Self Care Home Management;DME Instruction;Gait training;Functional mobility training;Therapeutic activities;Therapeutic exercise;Balance training;Prosthetic Training;Patient/family education;Neuromuscular re-education;Wheelchair mobility training;Manual techniques;Taping    PT Next Visit  Plan Progress general strength and condition for functional mobility and transfers    PT Home Exercise Plan left hip flexor stretch (Thomas test position), hip extension isometric, RLE SLR; 19 - SAQ, LAQ 1/30 band rows seated 2/6 prone laying hip flexor stretch 2/13 hamstring curls    Consulted and Agree with Plan of Care Patient;Family member/caregiver    Family Member Consulted PCA             Patient will benefit from skilled therapeutic intervention in order to improve the following deficits and impairments:  Decreased activity tolerance, Decreased balance, Decreased mobility, Decreased knowledge of use of DME, Decreased endurance, Decreased range of motion, Decreased strength, Difficulty walking, Impaired flexibility, Decreased safety awareness  Visit Diagnosis: Muscle weakness (generalized)  Difficulty in walking, not elsewhere classified  Unsteadiness on feet     Problem List Patient Active Problem  List   Diagnosis Date Noted   Acute respiratory failure with hypoxia and hypercarbia (HCC) 01/23/2021   CKD (chronic kidney disease) stage 3, GFR 30-59 ml/min (HCC) 01/23/2021   Acute on chronic systolic CHF (congestive heart failure) ([place]) 01/23/2021   Acute respiratory failure with hypoxia ([name]) 01/13/2021   Encounter for fitting and adjustment of hearing aid 12/21/2020   Other specified problems related to psychosocial circumstances 11/21/2020   Sensorineural hearing loss, bilateral 11/21/2020   Tobacco dependence 10/06/2020   Benign prostatic hyperplasia with urinary obstruction 10/06/2020   Above knee amputation of left lower extremity ([place]) 10/06/2020   Other specified counseling 10/06/2020   Severe sepsis ([place]) 09/24/2020   Schizophrenia, unspecified ([place]) 09/24/2020   Unspecified dementia, unspecified severity, without behavioral disturbance, psychotic disturbance, mood disturbance, and anxiety ([place]) 09/24/2020   Lung nodule 09/24/2020   Indwelling Foley catheter present 09/24/2020   Peripheral vascular disease ([place]) 09/24/2020   Mass of testicle 09/24/2020   Problem related to unspecified psychosocial circumstances 08/01/2020   Hypermetropia 07/22/2020   Unspecified atrial fibrillation ([place]) 07/22/2020   Klebsiella sepsis ([place]) 07/22/2020   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   SIRS (systemic inflammatory response syndrome) ([place]) 23/30/0762   Acute metabolic encephalopathy 26/33/3545   Hyponatremia    S/P AKA (above knee amputation) unilateral, left ([place]) 06/28/2020   Gangrene of left foot ([place]) 06/28/2020   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 ([place]) 05/27/2020   Critical lower limb ischemia ([place]) 05/21/2020   Malnutrition ([place]) 05/19/2020   Plantar callus 12/24/2019   Cavitary pneumonia 09/14/2019   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   Unintentional weight loss 09/14/2019   Coronary artery calcification seen on CT scan 10/28/2018   Hilar  adenopathy 04/10/2016   Lung nodule seen on imaging study 07/09/2015   Acute encephalopathy    Hypoglycemia due to insulin    Type 2 diabetes mellitus with other circulatory complications ([place]) 62/56/3893   Acute kidney injury superimposed on chronic kidney disease ([place]) 07/08/2015   Tobacco use disorder 07/08/2015   Diverticulosis of colon without hemorrhage    Left inguinal hernia 09/16/2013   Altered mental status 11/17/2012   Hypothermia 11/17/2012   Leukocytosis 11/17/2012   Chronic kidney disease, stage 3 unspecified ([place])    Post-traumatic stress disorder, chronic    GERD (gastroesophageal reflux disease)    Essential (primary) hypertension 09/10/2011   Anemia 09/10/2011   Diabetic neuropathy ([place]) 09/10/2011   DDD (degenerative disc disease), lumbar 09/10/2011   Psychosis ([place]) 09/10/2011   Diabetes mellitus type 2, uncontrolled 09/10/2011   Hyperlipemia 09/10/2011   Microalbuminuria 09/10/2011   3:28 PM,  03/27/21 Josue Hector PT DPT  Physical Therapist with [facility]  [phone] [place] [address] [place], [place], [address] Phone: [phone]   Fax:  [phone]  Name: DAWAYNE OHAIR MRN: [id number] Date of Birth: 1945/05/27

## 2021-04-03 ENCOUNTER — Ambulatory Visit (HOSPITAL_COMMUNITY): Payer: Medicare Other | Admitting: Physical Therapy

## 2021-04-03 ENCOUNTER — Other Ambulatory Visit: Payer: Self-pay

## 2021-04-03 ENCOUNTER — Encounter (HOSPITAL_COMMUNITY): Payer: Self-pay | Admitting: Physical Therapy

## 2021-04-03 DIAGNOSIS — M6281 Muscle weakness (generalized): Secondary | ICD-10-CM | POA: Diagnosis not present

## 2021-04-03 DIAGNOSIS — R262 Difficulty in walking, not elsewhere classified: Secondary | ICD-10-CM

## 2021-04-03 DIAGNOSIS — R2681 Unsteadiness on feet: Secondary | ICD-10-CM

## 2021-04-03 NOTE — Therapy (Signed)
OUTPATIENT PHYSICAL THERAPY TREATMENT NOTE   Patient Name: Allen Yu MRN: [id number] DOB:Mar 02, 1945, 76 y.o., male Today's Date: 04/03/2021  PCP: [name], MD REFERRING PROVIDER: Nita Sickle, MD    PT End of Session - 04/03/21 1313     Visit Number 11    Number of Visits 15    Date for PT Re-Evaluation 04/20/21    Authorization Type Medicare Part A and B; Tric[employer] for Life    Progress Note Due on Visit 15    PT Start Time 1304    PT Stop Time 1345    PT Time Calculation (min) 41 min    Activity Tolerance Patient tolerated treatment well;Patient limited by fatigue    Behavior During Therapy [facility] for tasks assessed/performed             Past Medical History:  Diagnosis Date   Anemia    Arthritis    Carotid stenosis    Chronic back pain    Chronic HFrEF (heart failure with reduced ejection fraction) ([place])    a. 09/2020 Echo: EF 45%, mild LVH; b. 12/2020 Echo: EF 25-30%, glob HK, mod LVH, mildly reduced RV fxn, RVSP 65.52mHg, mod BAE. Triv effusion. Mod MR/TR.   CKD (chronic kidney disease), stage III ([place]    Constipation    Dementia ([place]    Diabetes mellitus    Dilated cardiomyopathy ([place]    a. 09/2020 Echo: EF 45%; b. 12/2020 Echo: EF 25-30%.   GERD (gastroesophageal reflux disease)    History of kidney stones    Hypertension    Lung nodule    PAF (paroxysmal atrial fibrillation) (HCC)    Peripheral neuropathy    Peripheral vascular disease ([place]    a. 06/2020 s/p L AKA; b. 10/2020 s/p R SFA and above-knee popliteal PTA.   PTSD (post-traumatic stress disorder)    Schizophrenia ([place]    Tuberculosis    Treated   Past Surgical History:  Procedure Laterality Date   ABDOMINAL AORTOGRAM W/LOWER EXTREMITY N/A 05/20/2020   Procedure: ABDOMINAL AORTOGRAM W/LOWER EXTREMITY;  Surgeon: FElam Dutch MD;  [facility];  Service: Cardiovascular;  Laterality: N/A;   ABDOMINAL AORTOGRAM W/LOWER EXTREMITY N/A 10/27/2020   Procedure: ABDOMINAL  AORTOGRAM W/LOWER EXTREMITY;  Surgeon: CMarty Heck MD;  [facility];  Service: Cardiovascular;  Laterality: N/A;   AMPUTATION Left 06/28/2020   Procedure: AMPUTATION ABOVE KNEE LEFT;  Surgeon: ERosetta Posner MD;  [facility];  Service: Vascular;  Laterality: Left;   A[place] 2013   x2   BLADDER SURGERY     02   CHOLECYSTECTOMY     COLONOSCOPY     COLONOSCOPY N/A 12/07/2014   Procedure: COLONOSCOPY;  Surgeon: RDaneil Dolin MD;  [facility];  Service: Endoscopy;  Laterality: N/A;  10:30 Am   EYE SURGERY Bilateral    removed metal from eye   FEMORAL-TIBIAL BYPASS GRAFT Left 05/27/2020   Procedure: LEFT FEMORAL TO PERONEAL ARTERY BYPASS;  Surgeon: ERosetta Posner MD;  [facility]  Service: Vascular;  Laterality: Left;   HERNIA REPAIR Right    INGUINAL HERNIA REPAIR Left 11/03/2013   Procedure: HERNIA REPAIR INGUINAL ADULT;  Surgeon: EGayland Curry MD;  [facility]  Service: General;  Laterality: Left;   PERIPHERAL VASCULAR INTERVENTION  10/27/2020   Procedure: PERIPHERAL VASCULAR INTERVENTION;  Surgeon: CMarty Heck MD;  [facility];  Service: Cardiovascular;;   SHOULDER SURGERY     RIGHT SHOULDER    VIDEO BRONCHOSCOPY WITH ENDOBRONCHIAL NAVIGATION N/A 07/10/2019   Procedure: VIDEO BRONCHOSCOPY WITH ENDOBRONCHIAL NAVIGATION;  Surgeon: Melrose Nakayama, MD;  Location: [facility];  Service: Thoracic;  Laterality: N/A;   Patient Active Problem List   Diagnosis Date Noted   Acute respiratory failure with hypoxia and hypercarbia ([place]) 01/23/2021   CKD (chronic kidney disease) stage 3, GFR 30-59 ml/min ([place]) 01/23/2021   Acute on chronic systolic CHF (congestive heart failure) ([place]) 01/23/2021   Acute respiratory failure with hypoxia ([place]) 01/13/2021   Encounter for fitting and adjustment of hearing aid 12/21/2020   Other specified problems related to psychosocial circumstances 11/21/2020    Sensorineural hearing loss, bilateral 11/21/2020   Tobacco dependence 10/06/2020   Benign prostatic hyperplasia with urinary obstruction 10/06/2020   Above knee amputation of left lower extremity ([place]) 10/06/2020   Other specified counseling 10/06/2020   Severe sepsis ([place]) 09/24/2020   Schizophrenia, unspecified ([place]) 09/24/2020   Unspecified dementia, unspecified severity, without behavioral disturbance, psychotic disturbance, mood disturbance, and anxiety ([place]) 09/24/2020   Lung nodule 09/24/2020   Indwelling Foley catheter present 09/24/2020   Peripheral vascular disease ([place]) 09/24/2020   Mass of testicle 09/24/2020   Problem related to unspecified psychosocial circumstances 08/01/2020   Hypermetropia 07/22/2020   Unspecified atrial fibrillation ([place]) 07/22/2020   Klebsiella sepsis ([place]) 07/22/2020   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   SIRS (systemic inflammatory response syndrome) ([place]) 62/37/6283   Acute metabolic encephalopathy 15/17/6160   Hyponatremia    S/P AKA (above knee amputation) unilateral, left ([place]) 06/28/2020   Gangrene of left foot ([place]) 06/28/2020   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 ([place]) 05/27/2020   Critical lower limb ischemia ([place]) 05/21/2020   Malnutrition ([place]) 05/19/2020   Plantar callus 12/24/2019   Cavitary pneumonia 09/14/2019   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   Unintentional weight loss 09/14/2019   Coronary artery calcification seen on CT scan 10/28/2018   Hilar adenopathy 04/10/2016   Lung nodule seen on imaging study 07/09/2015   Acute encephalopathy    Hypoglycemia due to insulin    Type 2 diabetes mellitus with other circulatory complications ([place]) 73/71/0626   Acute kidney injury superimposed on chronic kidney disease ([place]) 07/08/2015   Tobacco use disorder 07/08/2015   Diverticulosis of colon without hemorrhage    Left inguinal hernia 09/16/2013   Altered mental status 11/17/2012   Hypothermia  11/17/2012   Leukocytosis 11/17/2012   Chronic kidney disease, stage 3 unspecified ([place])    Post-traumatic stress disorder, chronic    GERD (gastroesophageal reflux disease)    Essential (primary) hypertension 09/10/2011   Anemia 09/10/2011   Diabetic neuropathy ([place]) 09/10/2011   DDD (degenerative disc disease), lumbar 09/10/2011   Psychosis ([place]) 09/10/2011   Diabetes mellitus type 2, uncontrolled 09/10/2011   Hyperlipemia 09/10/2011   Microalbuminuria 09/10/2011    REFERRING DIAG: Gen. weakness' left AKA  THERAPY DIAG:  Muscle weakness (generalized)  Difficulty in walking, not elsewhere classified  Unsteadiness on feet  PERTINENT HISTORY: Lt AKA   PRECAUTIONS: Falls  SUBJECTIVE: No new reports. Has fitting for Diabetic shoe coming up.   PAIN:  Are you having pain? Yes: NPRS scale: 10/10 Pain location: "All over"  Pain description: chronic general pain  Aggravating factors: Unsure Relieving factors: Unsure      TODAY'S TREATMENT:  04/03/21 Nu step Lv3 8 min seat 12  LAQ 5lb 3 x10 Seated march  5lb 3x10 Seated hamstring curl BTB 3 x 10  Sit stand 2 x 5 with min Guard from elevated mat using RW   PATIENT EDUCATION: Education details: on exercise form and function and updated HEP  Person educated: Patient and Spouse Education method: Customer service manager Education comprehension: verbalized understanding and returned demonstration   HOME EXERCISE PROGRAM: left hip flexor stretch (Thomas test position), hip extension isometric, RLE SLR; 19 - SAQ, LAQ 1/30 band rows seated 2/6 prone laying hip flexor stretch 2/13 hamstring curls Laq, seated hamstring curl, sit to stand with support    PT Short Term Goals - 03/27/21 1332       PT SHORT TERM GOAL #1   Title Demonstrate bed mobility with modified independence to reduce level of assistance    Baseline min A    Time 6    Period Weeks    Status On-going    Target Date 03/09/21      PT SHORT TERM GOAL  #2   Title Sit to stand transfer with min A with RW to improve standing tolerance    Baseline min-mod A with HHA using RW    Time 6    Period Weeks    Status On-going    Target Date 03/09/21      PT SHORT TERM GOAL #3   Title Squat-pivot transfer min A to improve safety with maneuvering in small spaces    Baseline min-mod A    Time 6    Period Weeks    Status On-going    Target Date 03/09/21      PT SHORT TERM GOAL #4   Title Demo RLE 4/5 to prepare for gait/transfers    Baseline see MMT    Time 6    Period Weeks    Status On-going    Target Date 03/09/21              PT Long Term Goals - 03/27/21 1333       PT LONG TERM GOAL #1   Title Ambulate x 25 ft with RW with/without prosthetic to improve gait tolerance    Baseline Unable, has not been fit for prosthesis    Time 12    Period Weeks    Status On-going    Target Date 04/20/21      PT LONG TERM GOAL #2   Title Stand-pivot transfer with supervision to increase functional independence    Baseline min-mod A    Time 12    Period Weeks    Status On-going    Target Date 04/20/21              Plan - 03/27/21 1348       Clinical Impression Statement Patient tolerated session well today. Progressed LE strengthening by increasing reps with sit stands. Patient required verbal cues for max effort with seated LAQ and hamstring curls. Patient requires Min guard assist during sit to stands from elevated seat height. Educated patient and wife on updated HEP for progressive LE strength to improve standing ability. Patient will continue to benefit from skilled therapy services to reduce remaining deficits and improve functional ability.      Personal Factors and Comorbidities Age;Comorbidity 3+;Time since onset of injury/illness/exacerbation;Fitness     Comorbidities dementia, CKD, DM     Examination-Activity Limitations Bed Mobility;Lift;Toileting;Stand;Reach Overhead;Locomotion Level;Transfers     Stability/Clinical  Decision Making Evolving/Moderate complexity     Rehab Potential Fair     PT Frequency 1x / week  PT Duration 12 weeks     PT Treatment/Interventions ADLs/Self Care Home Management;DME Instruction;Gait training;Functional mobility training;Therapeutic activities;Therapeutic exercise;Balance training;Prosthetic Training;Patient/family education;Neuromuscular re-education;Wheelchair mobility training;Manual techniques;Taping     PT Next Visit Plan Progress general strength and condition for functional mobility and transfers          2:53 PM, 04/03/21 Josue Hector PT DPT  Physical Therapist with [facility]  [phone]

## 2021-04-10 ENCOUNTER — Emergency Department (HOSPITAL_COMMUNITY): Payer: Medicare Other

## 2021-04-10 ENCOUNTER — Other Ambulatory Visit: Payer: Self-pay

## 2021-04-10 ENCOUNTER — Encounter (HOSPITAL_COMMUNITY): Payer: TRICARE For Life (TFL) | Admitting: Physical Therapy

## 2021-04-10 ENCOUNTER — Encounter (HOSPITAL_COMMUNITY): Payer: Self-pay | Admitting: Emergency Medicine

## 2021-04-10 ENCOUNTER — Emergency Department (HOSPITAL_COMMUNITY)
Admission: EM | Admit: 2021-04-10 | Discharge: 2021-04-10 | Discharge status: Against Medical Advice | Payer: Medicare Other | Source: Home / Self Care | Attending: Emergency Medicine | Admitting: Emergency Medicine

## 2021-04-10 DIAGNOSIS — R059 Cough, unspecified: Secondary | ICD-10-CM | POA: Diagnosis not present

## 2021-04-10 DIAGNOSIS — F0390 Unspecified dementia without behavioral disturbance: Secondary | ICD-10-CM | POA: Diagnosis present

## 2021-04-10 DIAGNOSIS — I130 Hypertensive heart and chronic kidney disease with heart failure and stage 1 through stage 4 chronic kidney disease, or unspecified chronic kidney disease: Secondary | ICD-10-CM | POA: Diagnosis not present

## 2021-04-10 DIAGNOSIS — N390 Urinary tract infection, site not specified: Secondary | ICD-10-CM | POA: Diagnosis not present

## 2021-04-10 DIAGNOSIS — I4891 Unspecified atrial fibrillation: Secondary | ICD-10-CM | POA: Diagnosis not present

## 2021-04-10 DIAGNOSIS — I517 Cardiomegaly: Secondary | ICD-10-CM | POA: Diagnosis not present

## 2021-04-10 DIAGNOSIS — J189 Pneumonia, unspecified organism: Secondary | ICD-10-CM | POA: Diagnosis not present

## 2021-04-10 DIAGNOSIS — Z20822 Contact with and (suspected) exposure to covid-19: Secondary | ICD-10-CM | POA: Diagnosis present

## 2021-04-10 DIAGNOSIS — E876 Hypokalemia: Secondary | ICD-10-CM | POA: Diagnosis not present

## 2021-04-10 DIAGNOSIS — E1159 Type 2 diabetes mellitus with other circulatory complications: Secondary | ICD-10-CM | POA: Diagnosis not present

## 2021-04-10 DIAGNOSIS — D62 Acute posthemorrhagic anemia: Secondary | ICD-10-CM | POA: Diagnosis present

## 2021-04-10 DIAGNOSIS — I5022 Chronic systolic (congestive) heart failure: Secondary | ICD-10-CM | POA: Diagnosis not present

## 2021-04-10 DIAGNOSIS — E871 Hypo-osmolality and hyponatremia: Secondary | ICD-10-CM | POA: Diagnosis not present

## 2021-04-10 DIAGNOSIS — N3001 Acute cystitis with hematuria: Secondary | ICD-10-CM | POA: Diagnosis not present

## 2021-04-10 DIAGNOSIS — E872 Acidosis, unspecified: Secondary | ICD-10-CM | POA: Diagnosis present

## 2021-04-10 DIAGNOSIS — I959 Hypotension, unspecified: Secondary | ICD-10-CM | POA: Diagnosis present

## 2021-04-10 DIAGNOSIS — N183 Chronic kidney disease, stage 3 unspecified: Secondary | ICD-10-CM | POA: Diagnosis not present

## 2021-04-10 DIAGNOSIS — R6520 Severe sepsis without septic shock: Secondary | ICD-10-CM | POA: Diagnosis not present

## 2021-04-10 DIAGNOSIS — I509 Heart failure, unspecified: Secondary | ICD-10-CM | POA: Diagnosis not present

## 2021-04-10 DIAGNOSIS — D631 Anemia in chronic kidney disease: Secondary | ICD-10-CM | POA: Diagnosis present

## 2021-04-10 DIAGNOSIS — Z681 Body mass index (BMI) 19 or less, adult: Secondary | ICD-10-CM | POA: Diagnosis not present

## 2021-04-10 DIAGNOSIS — J90 Pleural effusion, not elsewhere classified: Secondary | ICD-10-CM | POA: Diagnosis not present

## 2021-04-10 DIAGNOSIS — I700 Atherosclerosis of aorta: Secondary | ICD-10-CM | POA: Diagnosis present

## 2021-04-10 DIAGNOSIS — Z7982 Long term (current) use of aspirin: Secondary | ICD-10-CM | POA: Insufficient documentation

## 2021-04-10 DIAGNOSIS — I480 Paroxysmal atrial fibrillation: Secondary | ICD-10-CM | POA: Diagnosis present

## 2021-04-10 DIAGNOSIS — K529 Noninfective gastroenteritis and colitis, unspecified: Secondary | ICD-10-CM | POA: Diagnosis not present

## 2021-04-10 DIAGNOSIS — N3080 Other cystitis without hematuria: Secondary | ICD-10-CM | POA: Diagnosis not present

## 2021-04-10 DIAGNOSIS — F209 Schizophrenia, unspecified: Secondary | ICD-10-CM | POA: Diagnosis not present

## 2021-04-10 DIAGNOSIS — Z66 Do not resuscitate: Secondary | ICD-10-CM | POA: Diagnosis present

## 2021-04-10 DIAGNOSIS — N189 Chronic kidney disease, unspecified: Secondary | ICD-10-CM | POA: Diagnosis not present

## 2021-04-10 DIAGNOSIS — R000 Tachycardia, unspecified: Secondary | ICD-10-CM | POA: Diagnosis not present

## 2021-04-10 DIAGNOSIS — I420 Dilated cardiomyopathy: Secondary | ICD-10-CM | POA: Diagnosis present

## 2021-04-10 DIAGNOSIS — J9811 Atelectasis: Secondary | ICD-10-CM | POA: Diagnosis not present

## 2021-04-10 DIAGNOSIS — R0602 Shortness of breath: Secondary | ICD-10-CM | POA: Diagnosis not present

## 2021-04-10 DIAGNOSIS — A419 Sepsis, unspecified organism: Secondary | ICD-10-CM | POA: Diagnosis not present

## 2021-04-10 DIAGNOSIS — R7881 Bacteremia: Secondary | ICD-10-CM | POA: Diagnosis not present

## 2021-04-10 DIAGNOSIS — D649 Anemia, unspecified: Secondary | ICD-10-CM | POA: Diagnosis not present

## 2021-04-10 DIAGNOSIS — R64 Cachexia: Secondary | ICD-10-CM | POA: Diagnosis present

## 2021-04-10 DIAGNOSIS — N1832 Chronic kidney disease, stage 3b: Secondary | ICD-10-CM | POA: Diagnosis not present

## 2021-04-10 DIAGNOSIS — N179 Acute kidney failure, unspecified: Secondary | ICD-10-CM | POA: Insufficient documentation

## 2021-04-10 DIAGNOSIS — E1122 Type 2 diabetes mellitus with diabetic chronic kidney disease: Secondary | ICD-10-CM | POA: Diagnosis present

## 2021-04-10 DIAGNOSIS — T83511A Infection and inflammatory reaction due to indwelling urethral catheter, initial encounter: Secondary | ICD-10-CM | POA: Diagnosis present

## 2021-04-10 DIAGNOSIS — I739 Peripheral vascular disease, unspecified: Secondary | ICD-10-CM | POA: Diagnosis not present

## 2021-04-10 LAB — URINALYSIS, ROUTINE W REFLEX MICROSCOPIC
Bilirubin Urine: NEGATIVE
Glucose, UA: NEGATIVE mg/dL
Ketones, ur: NEGATIVE mg/dL
Nitrite: NEGATIVE
Protein, ur: >=300 mg/dL — AB
RBC / HPF: >50 RBC/hpf — ABNORMAL HIGH (ref 0–5)
Specific Gravity, Urine: 1.015 (ref 1.005–1.030)
WBC, UA: >50 WBC/hpf — ABNORMAL HIGH (ref 0–5)
pH: 8 (ref 5.0–8.0)

## 2021-04-10 LAB — BASIC METABOLIC PANEL
Anion gap: 12 (ref 5–15)
BUN: 64 mg/dL — ABNORMAL HIGH (ref 8–23)
CO2: 26 mmol/L (ref 22–32)
Calcium: 9.1 mg/dL (ref 8.9–10.3)
Chloride: 99 mmol/L (ref 98–111)
Creatinine, Ser: 2.56 mg/dL — ABNORMAL HIGH (ref 0.61–1.24)
GFR, Estimated: 25 mL/min — ABNORMAL LOW (ref >60)
Glucose, Bld: 150 mg/dL — ABNORMAL HIGH (ref 70–99)
Potassium: 4.4 mmol/L (ref 3.5–5.1)
Sodium: 137 mmol/L (ref 135–145)

## 2021-04-10 LAB — CBC WITH DIFFERENTIAL/PLATELET
Band Neutrophils: 12 %
Basophils Absolute: 0 10*3/uL (ref 0.0–0.1)
Basophils Relative: 0 %
Eosinophils Absolute: 0 10*3/uL (ref 0.0–0.5)
Eosinophils Relative: 0 %
HCT: 24.3 % — ABNORMAL LOW (ref 39.0–52.0)
Hemoglobin: 7.7 g/dL — ABNORMAL LOW (ref 13.0–17.0)
Lymphocytes Relative: 3 %
Lymphs Abs: 0.5 10*3/uL — ABNORMAL LOW (ref 0.7–4.0)
MCH: 30.4 pg (ref 26.0–34.0)
MCHC: 31.7 g/dL (ref 30.0–36.0)
MCV: 96 fL (ref 80.0–100.0)
Metamyelocytes Relative: 2 %
Monocytes Absolute: 0.9 10*3/uL (ref 0.1–1.0)
Monocytes Relative: 5 %
Neutro Abs: 16.1 10*3/uL — ABNORMAL HIGH (ref 1.7–7.7)
Neutrophils Relative %: 78 %
Platelets: 258 10*3/uL (ref 150–400)
RBC: 2.53 MIL/uL — ABNORMAL LOW (ref 4.22–5.81)
RDW: 15.4 % (ref 11.5–15.5)
WBC: 17.9 10*3/uL — ABNORMAL HIGH (ref 4.0–10.5)
nRBC: 0 % (ref 0.0–0.2)

## 2021-04-10 LAB — RESP PANEL BY RT-PCR (FLU A&B, COVID) ARPGX2
Influenza A by PCR: NEGATIVE
Influenza B by PCR: NEGATIVE
SARS Coronavirus 2 by RT PCR: NEGATIVE

## 2021-04-10 LAB — LACTIC ACID, PLASMA: Lactic Acid, Venous: 1.9 mmol/L (ref 0.5–1.9)

## 2021-04-10 MED ORDER — SULFAMETHOXAZOLE-TRIMETHOPRIM 800-160 MG PO TABS: 1.0000 | ORAL_TABLET | Freq: Two times a day (BID) | ORAL | 0 refills | Status: DC

## 2021-04-10 MED ORDER — CEFTRIAXONE SODIUM 1 G IJ SOLR
1.0000 g | Freq: Once | INTRAMUSCULAR | Status: AC
  Administered 2021-04-10: 1 g via INTRAVENOUS
  Filled 2021-04-10: qty 10

## 2021-04-10 NOTE — ED Notes (Signed)
Pt was brought water  ?

## 2021-04-10 NOTE — ED Notes (Signed)
Patient has foley in place on arrival with brown tinged urine. Wife stats foley got caught on the chair a couple of nights ago and the catheter pulled. Patient's wife states the urine was clearing up and now it is brown.  ?

## 2021-04-10 NOTE — ED Triage Notes (Signed)
Pt to the ED with complaints of blood in his urine since Saturday. ? ?Pt has a chronic foley catheter du to a burst bladder. The foley bag got count on a chair on Saturday prior to the hematuria. ?

## 2021-04-10 NOTE — ED Notes (Signed)
Patient has tear to urethra following the whole posterior head of penis. Dr. Almyra Free is aware and verbalized chronic urethra tear.  ?

## 2021-04-10 NOTE — ED Provider Notes (Signed)
?[facility] ?Provider Note ? ? ?CSN: [id number] ?Arrival date & time: 04/10/21  1009 ? ?  ? ?History ? ?Chief Complaint  ?Patient presents with  ? Hematuria  ? ? ?Allen Yu is a 76 y.o. male. ? ?Patient presents to the hospital chief complaint of noticing blood in his Foley catheter.  He states that it got caught on furniture and gave a tug just a few days ago.  He had noticed bloody urine which has cleared up but her urine remains dark so he presented to the hospital.  He had low-grade temperature 99.7, no reports of fevers or cough no abdominal pain.  No vomiting or diarrhea reported. ? ? ?  ? ?Home Medications ?Prior to Admission medications   ?Medication Sig Start Date End Date Taking? Authorizing Provider  ?acetaminophen (TYLENOL) 500 MG tablet TAKE ONE TABLET BY MOUTH TWICE A DAY AS NEEDED FOR LOW BACK PAIN 03/07/21  Yes [provider]  ?Alcohol Swabs (ALCOHOL PADS) 70 % PADS USE 1 PAD AS DIRECTED 10/12/20 10/13/21 Yes [provider]  ?ARTIFICIAL SALIVA MT 4 SPRAYS MOUTH AS DIRECTED BY YOUR PROVIDER FOR SEVERE DRY MOUTH 02/01/21  Yes [provider]  ?aspirin EC 81 MG tablet Take 1 tablet (81 mg total) by mouth daily with breakfast. 01/19/21 01/19/22 Yes Emokpae, Courage, MD  ?atorvastatin (LIPITOR) 40 MG tablet Take 40 mg by mouth at bedtime.   Yes [provider]  ?B-D UF III MINI PEN NEEDLES 31G X 5 MM MISC SMARTSIG:1 Each SUB-Q Daily 12/23/19  Yes [provider]  ?clopidogrel (PLAVIX) 75 MG tablet Take 1 tablet (75 mg total) by mouth daily. 01/19/21 01/19/22 Yes Emokpae, Courage, MD  ?donepezil (ARICEPT) 10 MG tablet Take 10 mg by mouth at bedtime.  06/28/16  Yes [provider]  ?feeding supplement, ENSURE COMPLETE, (ENSURE COMPLETE) LIQD Take 237 mLs by mouth 2 (two) times daily between meals. 01/19/21  Yes Emokpae, Courage, MD  ?ferrous sulfate 325 (65 FE) MG tablet TAKE ONE TABLET BY MOUTH DAILY  FOR LOW IRON 03/07/21  Yes  [provider]  ?gabapentin (NEURONTIN) 100 MG capsule Take 100 mg by mouth at bedtime. 11/06/19  Yes [provider]  ?LANTUS 100 UNIT/ML injection Inject 0.06 mLs (6 Units total) into the skin at bedtime as needed (High blood glucose). If Blood glucose over 200 ?Patient taking differently: Inject 25-30 Units into the skin at bedtime as needed (High blood glucose). If Blood glucose over 100-300 25units and if blood sugar is over 300 30units 01/19/21  Yes Emokpae, Courage, MD  ?losartan (COZAAR) 50 MG tablet Take 1 tablet (50 mg total) by mouth daily. ?Patient taking differently: Take 100 mg by mouth daily. 03/22/21 06/20/21 Yes Strader, Fransisco Hertz, PA-C  ?metoprolol succinate (TOPROL-XL) 50 MG 24 hr tablet Take 1 tablet (50 mg total) by mouth daily. Take with or immediately following a meal. 11/29/20 04/10/21 Yes Strader, Fransisco Hertz, PA-C  ?Multiple Vitamins-Minerals (MULTIVITAMIN WITH MINERALS) tablet Take 1 tablet by mouth daily. 01/19/21 01/19/22 Yes Emokpae, Courage, MD  ?omeprazole (PRILOSEC) 20 MG capsule Take 1 capsule by mouth daily. 03/23/21  Yes [provider]  ?risperiDONE (RISPERDAL) 0.5 MG tablet Take 1 tablet (0.5 mg total) by mouth every 12 (twelve) hours as needed (Restlessness and agitation). 01/19/21  Yes Roxan Hockey, MD  ?sulfamethoxazole-trimethoprim (BACTRIM DS) 800-160 MG tablet Take 1 tablet by mouth 2 (two) times daily for 7 days. 04/10/21 04/17/21 Yes Luna Fuse, MD  ?torsemide (  DEMADEX) 20 MG tablet Take 1.5 tablets (30 mg total) by mouth daily. ?Patient taking differently: Take 10 mg by mouth daily. 01/25/21  Yes Barton Dubois, MD  ?traMADol (ULTRAM) 50 MG tablet Take 50 mg by mouth every 6 (six) hours as needed for moderate pain. 07/18/20  Yes [provider]  ?Vitamin D, Ergocalciferol, (DRISDOL) 1.25 MG (50000 UNIT) CAPS capsule Take 50,000 Units by mouth every Friday.   Yes [provider]  ?mirtazapine (REMERON) 15 MG tablet Take 0.5  tablets (7.5 mg total) by mouth at bedtime. ?Patient not taking: Reported on 04/10/2021 01/19/21   Roxan Hockey, MD  ?ondansetron (ZOFRAN) 4 MG tablet Take 1 tablet (4 mg total) by mouth every 6 (six) hours as needed for nausea. ?Patient not taking: Reported on 04/10/2021 01/19/21   Roxan Hockey, MD  ?   ? ?Allergies    ?Bee venom, Codeine, Propoxyphene, and Valsartan   ? ?Review of Systems   ?Review of Systems  ?Constitutional:  Negative for fever.  ?HENT:  Negative for ear pain and sore throat.   ?Eyes:  Negative for pain.  ?Respiratory:  Negative for cough.   ?Cardiovascular:  Negative for chest pain.  ?Gastrointestinal:  Negative for abdominal pain.  ?Genitourinary:  Negative for flank pain.  ?Musculoskeletal:  Negative for back pain.  ?Skin:  Negative for color change and rash.  ?Neurological:  Negative for syncope.  ?All other systems reviewed and are negative. ? ?Physical Exam ?Updated Vital Signs ?BP 113/89   Pulse (!) 44   Temp 99.2 ?F (37.3 ?C) (Oral)   Resp (!) 24   Ht 6' (1.829 m)   Wt 59 kg   SpO2 100%   BMI 17.63 kg/m?  ?Physical Exam ?Constitutional:   ?   Appearance: He is well-developed.  ?HENT:  ?   Head: Normocephalic.  ?   Nose: Nose normal.  ?Eyes:  ?   Extraocular Movements: Extraocular movements intact.  ?Cardiovascular:  ?   Rate and Rhythm: Normal rate.  ?Pulmonary:  ?   Effort: Pulmonary effort is normal.  ?Skin: ?   Coloration: Skin is not jaundiced.  ?Neurological:  ?   Mental Status: He is alert. Mental status is at baseline.  ? ? ?ED Results / Procedures / Treatments   ?Labs ?(all labs ordered are listed, but only abnormal results are displayed) ?Labs Reviewed  ?CBC WITH DIFFERENTIAL/PLATELET - Abnormal; Notable for the following components:  ?    Result Value  ? WBC 17.9 (*)   ? RBC 2.53 (*)   ? Hemoglobin 7.7 (*)   ? HCT 24.3 (*)   ? Neutro Abs 16.1 (*)   ? Lymphs Abs 0.5 (*)   ? All other components within normal limits  ?BASIC METABOLIC PANEL - Abnormal; Notable for the  following components:  ? Glucose, Bld 150 (*)   ? BUN 64 (*)   ? Creatinine, Ser 2.56 (*)   ? GFR, Estimated 25 (*)   ? All other components within normal limits  ?URINALYSIS, ROUTINE W REFLEX MICROSCOPIC - Abnormal; Notable for the following components:  ? Color, Urine AMBER (*)   ? APPearance CLOUDY (*)   ? Hgb urine dipstick MODERATE (*)   ? Protein, ur >=300 (*)   ? Leukocytes,Ua MODERATE (*)   ? RBC / HPF >50 (*)   ? WBC, UA >50 (*)   ? Bacteria, UA MANY (*)   ? All other components within normal limits  ?CULTURE, BLOOD (ROUTINE X 2)  ?  CULTURE, BLOOD (ROUTINE X 2)  ?URINE CULTURE  ?RESP PANEL BY RT-PCR (FLU A&B, COVID) ARPGX2  ?LACTIC ACID, PLASMA  ? ? ?EKG ?None ? ?Radiology ?DG Chest Port 1 View ? ?Result Date: 04/10/2021 ?CLINICAL DATA:  Sepsis. EXAM: PORTABLE CHEST 1 VIEW COMPARISON:  01/23/2021 FINDINGS: Mild elevation of the left hemidiaphragm. Improved aeration at the lung bases compared to the previous examination. Densities along the periphery of both lungs could be related overlying soft tissues. No large areas of airspace disease or consolidation. Heart size is normal. Surgical plate in lower cervical spine. Atherosclerotic calcifications at the aortic arch. Few densities at the left lung base are suggestive for atelectasis or scarring. IMPRESSION: Scattered densities throughout the chest that could represent chronic changes and overlying densities. No acute cardiopulmonary disease. Electronically Signed   By: Markus Daft M.D.   On: 04/10/2021 13:27   ? ?Procedures ?Procedures  ? ? ?Medications Ordered in ED ?Medications  ?cefTRIAXone (ROCEPHIN) 1 g in sodium chloride 0.9 % 100 mL IVPB (0 g Intravenous Stopped 04/10/21 1317)  ? ? ?ED Course/ Medical Decision Making/ A&P ?  ?                        ?Medical Decision Making ?Amount and/or Complexity of Data Reviewed ?Labs: ordered. ?Radiology: ordered. ? ? ?Review of records show outpatient rehab visits April 03, 2021. ? ?Patient on cardiac monitor, sinus  rhythm noted. ? ?Labs include a work-up white count of 17.9 hemoglobin 7.7 chemistry shows renal insufficiency creatinine 2.5. ? ?At this point I was concerned about a UTI complicated with Foley catheter, given he ha

## 2021-04-10 NOTE — Discharge Instructions (Addendum)
I am concerned about a urine infection, as well as evidence of kidney injury. ? ?Follow-up with your doctor or urologist as soon as you can in 1 or 2 days. ? ?Return back to the ER if you change your mind at any time. ? ?

## 2021-04-11 ENCOUNTER — Emergency Department (HOSPITAL_COMMUNITY): Payer: Medicare Other

## 2021-04-11 ENCOUNTER — Encounter (HOSPITAL_COMMUNITY): Payer: Self-pay

## 2021-04-11 ENCOUNTER — Other Ambulatory Visit: Payer: Self-pay

## 2021-04-11 ENCOUNTER — Inpatient Hospital Stay (HOSPITAL_COMMUNITY)
Admission: EM | Admit: 2021-04-11 | Discharge: 2021-04-17 | DRG: 698 | Disposition: A | Discharge status: Home / Self Care | Payer: Medicare Other | Attending: Internal Medicine | Admitting: Internal Medicine

## 2021-04-11 ENCOUNTER — Telehealth (HOSPITAL_BASED_OUTPATIENT_CLINIC_OR_DEPARTMENT_OTHER): Payer: Self-pay

## 2021-04-11 DIAGNOSIS — N183 Chronic kidney disease, stage 3 unspecified: Secondary | ICD-10-CM | POA: Diagnosis present

## 2021-04-11 DIAGNOSIS — Z89612 Acquired absence of left leg above knee: Secondary | ICD-10-CM

## 2021-04-11 DIAGNOSIS — E876 Hypokalemia: Secondary | ICD-10-CM | POA: Diagnosis not present

## 2021-04-11 DIAGNOSIS — F0390 Unspecified dementia without behavioral disturbance: Secondary | ICD-10-CM | POA: Diagnosis present

## 2021-04-11 DIAGNOSIS — A419 Sepsis, unspecified organism: Secondary | ICD-10-CM | POA: Diagnosis not present

## 2021-04-11 DIAGNOSIS — N189 Chronic kidney disease, unspecified: Secondary | ICD-10-CM

## 2021-04-11 DIAGNOSIS — E1151 Type 2 diabetes mellitus with diabetic peripheral angiopathy without gangrene: Secondary | ICD-10-CM | POA: Diagnosis present

## 2021-04-11 DIAGNOSIS — N3080 Other cystitis without hematuria: Secondary | ICD-10-CM | POA: Diagnosis not present

## 2021-04-11 DIAGNOSIS — K529 Noninfective gastroenteritis and colitis, unspecified: Secondary | ICD-10-CM

## 2021-04-11 DIAGNOSIS — I959 Hypotension, unspecified: Secondary | ICD-10-CM | POA: Diagnosis present

## 2021-04-11 DIAGNOSIS — J9811 Atelectasis: Secondary | ICD-10-CM | POA: Diagnosis present

## 2021-04-11 DIAGNOSIS — I739 Peripheral vascular disease, unspecified: Secondary | ICD-10-CM | POA: Diagnosis present

## 2021-04-11 DIAGNOSIS — F4310 Post-traumatic stress disorder, unspecified: Secondary | ICD-10-CM | POA: Diagnosis present

## 2021-04-11 DIAGNOSIS — T83098A Other mechanical complication of other indwelling urethral catheter, initial encounter: Secondary | ICD-10-CM | POA: Diagnosis present

## 2021-04-11 DIAGNOSIS — F209 Schizophrenia, unspecified: Secondary | ICD-10-CM | POA: Diagnosis present

## 2021-04-11 DIAGNOSIS — Z794 Long term (current) use of insulin: Secondary | ICD-10-CM

## 2021-04-11 DIAGNOSIS — R6520 Severe sepsis without septic shock: Secondary | ICD-10-CM | POA: Diagnosis not present

## 2021-04-11 DIAGNOSIS — D631 Anemia in chronic kidney disease: Secondary | ICD-10-CM | POA: Diagnosis present

## 2021-04-11 DIAGNOSIS — J189 Pneumonia, unspecified organism: Secondary | ICD-10-CM | POA: Diagnosis present

## 2021-04-11 DIAGNOSIS — N179 Acute kidney failure, unspecified: Secondary | ICD-10-CM | POA: Diagnosis present

## 2021-04-11 DIAGNOSIS — Z993 Dependence on wheelchair: Secondary | ICD-10-CM

## 2021-04-11 DIAGNOSIS — Z79899 Other long term (current) drug therapy: Secondary | ICD-10-CM

## 2021-04-11 DIAGNOSIS — I509 Heart failure, unspecified: Secondary | ICD-10-CM | POA: Diagnosis not present

## 2021-04-11 DIAGNOSIS — R636 Underweight: Secondary | ICD-10-CM | POA: Diagnosis present

## 2021-04-11 DIAGNOSIS — F419 Anxiety disorder, unspecified: Secondary | ICD-10-CM | POA: Diagnosis present

## 2021-04-11 DIAGNOSIS — E1159 Type 2 diabetes mellitus with other circulatory complications: Secondary | ICD-10-CM | POA: Diagnosis present

## 2021-04-11 DIAGNOSIS — N3081 Other cystitis with hematuria: Secondary | ICD-10-CM | POA: Diagnosis present

## 2021-04-11 DIAGNOSIS — I5022 Chronic systolic (congestive) heart failure: Secondary | ICD-10-CM | POA: Diagnosis present

## 2021-04-11 DIAGNOSIS — J90 Pleural effusion, not elsewhere classified: Secondary | ICD-10-CM | POA: Diagnosis present

## 2021-04-11 DIAGNOSIS — I480 Paroxysmal atrial fibrillation: Secondary | ICD-10-CM | POA: Diagnosis present

## 2021-04-11 DIAGNOSIS — Z681 Body mass index (BMI) 19 or less, adult: Secondary | ICD-10-CM | POA: Diagnosis not present

## 2021-04-11 DIAGNOSIS — Z7982 Long term (current) use of aspirin: Secondary | ICD-10-CM

## 2021-04-11 DIAGNOSIS — B9620 Unspecified Escherichia coli [E. coli] as the cause of diseases classified elsewhere: Secondary | ICD-10-CM | POA: Diagnosis present

## 2021-04-11 DIAGNOSIS — D62 Acute posthemorrhagic anemia: Secondary | ICD-10-CM | POA: Diagnosis present

## 2021-04-11 DIAGNOSIS — R64 Cachexia: Secondary | ICD-10-CM | POA: Diagnosis present

## 2021-04-11 DIAGNOSIS — I420 Dilated cardiomyopathy: Secondary | ICD-10-CM | POA: Diagnosis present

## 2021-04-11 DIAGNOSIS — T83511A Infection and inflammatory reaction due to indwelling urethral catheter, initial encounter: Principal | ICD-10-CM | POA: Diagnosis present

## 2021-04-11 DIAGNOSIS — I130 Hypertensive heart and chronic kidney disease with heart failure and stage 1 through stage 4 chronic kidney disease, or unspecified chronic kidney disease: Secondary | ICD-10-CM | POA: Diagnosis present

## 2021-04-11 DIAGNOSIS — R7881 Bacteremia: Secondary | ICD-10-CM | POA: Diagnosis present

## 2021-04-11 DIAGNOSIS — I4891 Unspecified atrial fibrillation: Secondary | ICD-10-CM | POA: Diagnosis not present

## 2021-04-11 DIAGNOSIS — Z885 Allergy status to narcotic agent status: Secondary | ICD-10-CM

## 2021-04-11 DIAGNOSIS — Z888 Allergy status to other drugs, medicaments and biological substances status: Secondary | ICD-10-CM

## 2021-04-11 DIAGNOSIS — Z20822 Contact with and (suspected) exposure to covid-19: Secondary | ICD-10-CM | POA: Diagnosis present

## 2021-04-11 DIAGNOSIS — R0602 Shortness of breath: Secondary | ICD-10-CM | POA: Diagnosis not present

## 2021-04-11 DIAGNOSIS — B952 Enterococcus as the cause of diseases classified elsewhere: Secondary | ICD-10-CM | POA: Diagnosis present

## 2021-04-11 DIAGNOSIS — Z66 Do not resuscitate: Secondary | ICD-10-CM | POA: Diagnosis present

## 2021-04-11 DIAGNOSIS — D649 Anemia, unspecified: Secondary | ICD-10-CM | POA: Diagnosis not present

## 2021-04-11 DIAGNOSIS — E1122 Type 2 diabetes mellitus with diabetic chronic kidney disease: Secondary | ICD-10-CM | POA: Diagnosis present

## 2021-04-11 DIAGNOSIS — I5042 Chronic combined systolic (congestive) and diastolic (congestive) heart failure: Secondary | ICD-10-CM

## 2021-04-11 DIAGNOSIS — E1142 Type 2 diabetes mellitus with diabetic polyneuropathy: Secondary | ICD-10-CM | POA: Diagnosis present

## 2021-04-11 DIAGNOSIS — Z91030 Bee allergy status: Secondary | ICD-10-CM

## 2021-04-11 DIAGNOSIS — E871 Hypo-osmolality and hyponatremia: Secondary | ICD-10-CM | POA: Diagnosis present

## 2021-04-11 DIAGNOSIS — T368X5A Adverse effect of other systemic antibiotics, initial encounter: Secondary | ICD-10-CM | POA: Diagnosis present

## 2021-04-11 DIAGNOSIS — I700 Atherosclerosis of aorta: Secondary | ICD-10-CM | POA: Diagnosis present

## 2021-04-11 DIAGNOSIS — Z960 Presence of urogenital implants: Secondary | ICD-10-CM | POA: Diagnosis present

## 2021-04-11 DIAGNOSIS — R059 Cough, unspecified: Secondary | ICD-10-CM | POA: Diagnosis not present

## 2021-04-11 DIAGNOSIS — K219 Gastro-esophageal reflux disease without esophagitis: Secondary | ICD-10-CM | POA: Diagnosis present

## 2021-04-11 DIAGNOSIS — M47814 Spondylosis without myelopathy or radiculopathy, thoracic region: Secondary | ICD-10-CM | POA: Diagnosis present

## 2021-04-11 DIAGNOSIS — E872 Acidosis, unspecified: Secondary | ICD-10-CM | POA: Diagnosis present

## 2021-04-11 DIAGNOSIS — N1832 Chronic kidney disease, stage 3b: Secondary | ICD-10-CM | POA: Diagnosis present

## 2021-04-11 DIAGNOSIS — R000 Tachycardia, unspecified: Secondary | ICD-10-CM | POA: Diagnosis not present

## 2021-04-11 DIAGNOSIS — F17210 Nicotine dependence, cigarettes, uncomplicated: Secondary | ICD-10-CM | POA: Diagnosis present

## 2021-04-11 DIAGNOSIS — I517 Cardiomegaly: Secondary | ICD-10-CM | POA: Diagnosis not present

## 2021-04-11 DIAGNOSIS — N390 Urinary tract infection, site not specified: Secondary | ICD-10-CM | POA: Diagnosis not present

## 2021-04-11 LAB — BLOOD CULTURE ID PANEL (REFLEXED) - BCID2

## 2021-04-11 LAB — URINE CULTURE

## 2021-04-11 LAB — CBC WITH DIFFERENTIAL/PLATELET
Abs Immature Granulocytes: 0.04 10*3/uL (ref 0.00–0.07)
Basophils Absolute: 0 10*3/uL (ref 0.0–0.1)
Basophils Relative: 0 %
Eosinophils Absolute: 0 10*3/uL (ref 0.0–0.5)
Eosinophils Relative: 0 %
HCT: 19.8 % — ABNORMAL LOW (ref 39.0–52.0)
Hemoglobin: 6.4 g/dL — CL (ref 13.0–17.0)
Immature Granulocytes: 0 %
Lymphocytes Relative: 5 %
Lymphs Abs: 1 10*3/uL (ref 0.7–4.0)
MCH: 32 pg (ref 26.0–34.0)
MCHC: 32.3 g/dL (ref 30.0–36.0)
MCV: 99 fL (ref 80.0–100.0)
Monocytes Absolute: 1.4 10*3/uL — ABNORMAL HIGH (ref 0.1–1.0)
Monocytes Relative: 7 %
Neutro Abs: 18 10*3/uL — ABNORMAL HIGH (ref 1.7–7.7)
Neutrophils Relative %: 88 %
Platelets: 276 10*3/uL (ref 150–400)
RBC: 2 MIL/uL — ABNORMAL LOW (ref 4.22–5.81)
RDW: 14.4 % (ref 11.5–15.5)
WBC: 20.5 10*3/uL — ABNORMAL HIGH (ref 4.0–10.5)
nRBC: 0 % (ref 0.0–0.2)

## 2021-04-11 LAB — IRON AND TIBC
Iron: 62 ug/dL (ref 45–182)
Saturation Ratios: 33 % (ref 17.9–39.5)
TIBC: 191 ug/dL — ABNORMAL LOW (ref 250–450)
UIBC: 129 ug/dL

## 2021-04-11 LAB — RETICULOCYTES
Immature Retic Fract: 13.5 % (ref 2.3–15.9)
RBC.: 1.97 MIL/uL — ABNORMAL LOW (ref 4.22–5.81)
Retic Count, Absolute: 55.6 10*3/uL (ref 19.0–186.0)
Retic Ct Pct: 2.8 % (ref 0.4–3.1)

## 2021-04-11 LAB — COMPREHENSIVE METABOLIC PANEL
ALT: 18 U/L (ref 0–44)
AST: 23 U/L (ref 15–41)
Albumin: 3 g/dL — ABNORMAL LOW (ref 3.5–5.0)
Alkaline Phosphatase: 83 U/L (ref 38–126)
Anion gap: 10 (ref 5–15)
BUN: 87 mg/dL — ABNORMAL HIGH (ref 8–23)
CO2: 22 mmol/L (ref 22–32)
Calcium: 8.1 mg/dL — ABNORMAL LOW (ref 8.9–10.3)
Chloride: 96 mmol/L — ABNORMAL LOW (ref 98–111)
Creatinine, Ser: 3.15 mg/dL — ABNORMAL HIGH (ref 0.61–1.24)
GFR, Estimated: 20 mL/min — ABNORMAL LOW (ref >60)
Glucose, Bld: 274 mg/dL — ABNORMAL HIGH (ref 70–99)
Potassium: 3.6 mmol/L (ref 3.5–5.1)
Sodium: 128 mmol/L — ABNORMAL LOW (ref 135–145)
Total Bilirubin: 3.6 mg/dL — ABNORMAL HIGH (ref 0.3–1.2)
Total Protein: 6.5 g/dL (ref 6.5–8.1)

## 2021-04-11 LAB — PREPARE RBC (CROSSMATCH)

## 2021-04-11 LAB — RESP PANEL BY RT-PCR (FLU A&B, COVID) ARPGX2
Influenza A by PCR: NEGATIVE
Influenza B by PCR: NEGATIVE
SARS Coronavirus 2 by RT PCR: NEGATIVE

## 2021-04-11 LAB — LACTIC ACID, PLASMA
Lactic Acid, Venous: 2.2 mmol/L (ref 0.5–1.9)
Lactic Acid, Venous: 1.9 mmol/L (ref 0.5–1.9)

## 2021-04-11 LAB — PROTIME-INR
INR: 1.5 — ABNORMAL HIGH (ref 0.8–1.2)
Prothrombin Time: 18.2 seconds — ABNORMAL HIGH (ref 11.4–15.2)

## 2021-04-11 LAB — VITAMIN B12: Vitamin B-12: 370 pg/mL (ref 180–914)

## 2021-04-11 LAB — FOLATE: Folate: 22.9 ng/mL (ref >5.9)

## 2021-04-11 LAB — FERRITIN: Ferritin: 662 ng/mL — ABNORMAL HIGH (ref 24–336)

## 2021-04-11 LAB — APTT: aPTT: 37 seconds — ABNORMAL HIGH (ref 24–36)

## 2021-04-11 MED ORDER — SODIUM CHLORIDE 0.9 % IV SOLN
500.0000 mg | INTRAVENOUS | Status: DC
  Administered 2021-04-12: 500 mg via INTRAVENOUS
  Filled 2021-04-11 (×2): qty 5

## 2021-04-11 MED ORDER — SODIUM CHLORIDE 0.9 % IV SOLN: INTRAVENOUS | Status: AC

## 2021-04-11 MED ORDER — SODIUM CHLORIDE 0.9 % IV SOLN: 2.0000 g | INTRAVENOUS | Status: DC

## 2021-04-11 MED ORDER — GABAPENTIN 100 MG PO CAPS
100.0000 mg | ORAL_CAPSULE | Freq: Every day | ORAL | Status: DC
  Administered 2021-04-12 – 2021-04-17 (×6): 100 mg via ORAL
  Filled 2021-04-11 (×6): qty 1

## 2021-04-11 MED ORDER — ACETAMINOPHEN 325 MG PO TABS
650.0000 mg | ORAL_TABLET | Freq: Four times a day (QID) | ORAL | Status: DC | PRN
  Administered 2021-04-12 – 2021-04-16 (×5): 650 mg via ORAL
  Filled 2021-04-11 (×5): qty 2

## 2021-04-11 MED ORDER — ATORVASTATIN CALCIUM 40 MG PO TABS
40.0000 mg | ORAL_TABLET | Freq: Every day | ORAL | Status: DC
  Administered 2021-04-12 – 2021-04-17 (×6): 40 mg via ORAL
  Filled 2021-04-11 (×6): qty 1

## 2021-04-11 MED ORDER — DONEPEZIL HCL 5 MG PO TABS
10.0000 mg | ORAL_TABLET | Freq: Every day | ORAL | Status: DC
  Administered 2021-04-12 – 2021-04-17 (×6): 10 mg via ORAL
  Filled 2021-04-11 (×6): qty 2

## 2021-04-11 MED ORDER — ACETAMINOPHEN 650 MG RE SUPP: 650.0000 mg | Freq: Four times a day (QID) | RECTAL | Status: DC | PRN

## 2021-04-11 MED ORDER — LACTATED RINGERS IV BOLUS
500.0000 mL | Freq: Once | INTRAVENOUS | Status: AC
  Administered 2021-04-11: 500 mL via INTRAVENOUS

## 2021-04-11 MED ORDER — LORAZEPAM 2 MG/ML IJ SOLN
0.5000 mg | INTRAMUSCULAR | Status: DC | PRN
  Administered 2021-04-17: 0.5 mg via INTRAVENOUS
  Filled 2021-04-11: qty 1

## 2021-04-11 MED ORDER — INSULIN GLARGINE-YFGN 100 UNIT/ML ~~LOC~~ SOLN
10.0000 [IU] | Freq: Every day | SUBCUTANEOUS | Status: DC
Start: 2021-04-12 — End: 2021-04-17
  Administered 2021-04-12 – 2021-04-16 (×5): 10 [IU] via SUBCUTANEOUS
  Filled 2021-04-11 (×9): qty 0.1

## 2021-04-11 MED ORDER — ONDANSETRON HCL 4 MG PO TABS: 4.0000 mg | ORAL_TABLET | Freq: Four times a day (QID) | ORAL | Status: DC | PRN

## 2021-04-11 MED ORDER — LACTATED RINGERS IV BOLUS: 1000.0000 mL | Freq: Once | INTRAVENOUS | Status: DC

## 2021-04-11 MED ORDER — CLOPIDOGREL BISULFATE 75 MG PO TABS
75.0000 mg | ORAL_TABLET | Freq: Every day | ORAL | Status: DC
  Administered 2021-04-12 – 2021-04-17 (×6): 75 mg via ORAL
  Filled 2021-04-11 (×6): qty 1

## 2021-04-11 MED ORDER — SODIUM CHLORIDE 0.9 % IV SOLN
10.0000 mL/h | Freq: Once | INTRAVENOUS | Status: AC
Start: 2021-04-11 — End: 2021-04-11
  Administered 2021-04-11: 10 mL/h via INTRAVENOUS

## 2021-04-11 MED ORDER — ONDANSETRON HCL 4 MG/2ML IJ SOLN
4.0000 mg | Freq: Four times a day (QID) | INTRAMUSCULAR | Status: DC | PRN
  Administered 2021-04-15: 4 mg via INTRAVENOUS
  Filled 2021-04-11: qty 2

## 2021-04-11 MED ORDER — INSULIN ASPART 100 UNIT/ML IJ SOLN
0.0000 [IU] | Freq: Every day | INTRAMUSCULAR | Status: DC
  Administered 2021-04-14: 2 [IU] via SUBCUTANEOUS

## 2021-04-11 MED ORDER — POLYETHYLENE GLYCOL 3350 17 G PO PACK: 17.0000 g | PACK | Freq: Every day | ORAL | Status: DC | PRN

## 2021-04-11 MED ORDER — ASPIRIN EC 81 MG PO TBEC
81.0000 mg | DELAYED_RELEASE_TABLET | Freq: Every day | ORAL | Status: DC
  Administered 2021-04-12 – 2021-04-17 (×6): 81 mg via ORAL
  Filled 2021-04-11 (×6): qty 1

## 2021-04-11 MED ORDER — INSULIN ASPART 100 UNIT/ML IJ SOLN
0.0000 [IU] | Freq: Three times a day (TID) | INTRAMUSCULAR | Status: DC
  Administered 2021-04-12: 3 [IU] via SUBCUTANEOUS
  Administered 2021-04-13: 1 [IU] via SUBCUTANEOUS
  Administered 2021-04-13 (×2): 3 [IU] via SUBCUTANEOUS
  Administered 2021-04-14: 5 [IU] via SUBCUTANEOUS
  Administered 2021-04-14: 3 [IU] via SUBCUTANEOUS
  Administered 2021-04-15: 2 [IU] via SUBCUTANEOUS
  Administered 2021-04-15: 3 [IU] via SUBCUTANEOUS
  Administered 2021-04-15 – 2021-04-16 (×2): 2 [IU] via SUBCUTANEOUS
  Administered 2021-04-16: 1 [IU] via SUBCUTANEOUS
  Administered 2021-04-16: 2 [IU] via SUBCUTANEOUS
  Administered 2021-04-17: 3 [IU] via SUBCUTANEOUS
  Administered 2021-04-17: 1 [IU] via SUBCUTANEOUS

## 2021-04-11 MED ORDER — SODIUM CHLORIDE 0.9 % IV SOLN
2.0000 g | Freq: Once | INTRAVENOUS | Status: AC
  Administered 2021-04-11: 2 g via INTRAVENOUS
  Filled 2021-04-11: qty 2

## 2021-04-11 MED ORDER — HEPARIN SODIUM (PORCINE) 5000 UNIT/ML IJ SOLN
5000.0000 [IU] | Freq: Three times a day (TID) | INTRAMUSCULAR | Status: DC
  Administered 2021-04-12 – 2021-04-17 (×7): 5000 [IU] via SUBCUTANEOUS
  Filled 2021-04-11 (×10): qty 1

## 2021-04-11 NOTE — Assessment & Plan Note (Addendum)
-  Creatinine 3.15 and is continue to worsen, baseline 1.3-1.6. Baseline CKD 3A-B.  ?Creatinine has increased to 2.5, from when he was in the ED yesterday.  Barely any oral intake in the past 4 days. ?-IVF now stopped  ?-Hold losartan, torsemide for now ?-Avoid nephrotoxic medications, contrast dyes, hypotension and renally dose medications ?-Foley catheter has been exchanged. ?-CT Renal Stone study showed "Compared to 07/21/2020, there is again air throughout the urinary bladder wall and also air within the urinary bladder lumen. 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 Air is no longer visualized within the urethra, anterior peritoneal, and lateral pelvic sidewalls. Recommend clinical correlation. Resolution of the prior mild bilateral hydronephrosis. There is again mild-to-moderate circumferential wall thickening of the rectum. New decompression and mild circumferential wall thickening of the entire sigmoid colon in the majority of the descending colon. This is suggestive of colitis. New elevation of the left hemidiaphragm. This may correspond to the volume loss and opacity concerning for pneumonia on recent radiographs as well as 01/23/2021 radiograph.  There is moderate cardiomegaly new compared to 07/21/2020 CT. This change is also seen on 04/11/2021 versus 07/21/2020 radiographs." ?-BUN/Cr went from 87/3.15 -> 95/3.44 -> 93/3.42 -> 75/2.96 -> 77/2.95 -> 68/2.49 -> 49/1.98 ?-Given that his renal function continues to be elevated have consulted nephrology as a above ?-Given IV Lasix given CXR findings ?-Continue to Monitor and Trend and Repeat CMP in the AM ?

## 2021-04-11 NOTE — ED Notes (Signed)
Update given to patient's wife Allen Yu as to his room number and that he is going upstairs momentarily. ?

## 2021-04-11 NOTE — ED Notes (Signed)
Patient transported to CT 

## 2021-04-11 NOTE — ED Notes (Signed)
Report attempted 

## 2021-04-11 NOTE — ED Triage Notes (Signed)
Patient called top come back to the ED due to positive blood cultures. He was seen here the previous day and treated for acute cystitis.  ?

## 2021-04-11 NOTE — Assessment & Plan Note (Addendum)
Enterococcus Faecalis UTI, poA  ?-Suggested on renal CT stone study.   ?-Blood cultures growing E. Coli that is pansensitive.   ?-Has chronic indwelling Foley catheter- Foley was changed yesterday-3/20 in ED. ?-IV antibiotics for now and consolidate antibiotics to IV Unasyn and Flagyl and after discussion with ID they recommend continuing Unasyn and Flagyl and continuing Abx for a total of 10 days  ?-Initial Urine Cx showed Multiple species present but repeat shows >100,000 CFU of Enterococcus Faecalis and is pansensitive ?-IVF now stopped ?-May benefit from urology in-put but will recommend outpatient follow up  ?

## 2021-04-11 NOTE — ED Notes (Signed)
Lab at Bedside ? ?

## 2021-04-11 NOTE — ED Notes (Signed)
Note made on 04/11/21 at 0723  ? ?Dr. Sedonia Small made this RN aware of need to call pt to return to ED due to positive blood cultures. I spoke with pt and his wife and they report once pt's aide gets to the house and gets him ready they will be on their way.  ?

## 2021-04-11 NOTE — Assessment & Plan Note (Addendum)
E Coli Bacteremia  ?-Blood cultures done yesterday 3/20-rapid ID panel positive for E. coli.   ?-Likely source from patient's chronic indwelling Foley catheter.   ?-Urine culture 3/20 - multiple species present, recollection suggested and was sent and now showing >100,000 CFU of Enterococcus Faecalis ?-He has significant leukocytosis of 20.5 with repeat being 20.3 and has now trended down to 14.1. With lactic acid of 2.2 > 1.9.  Also was hypotensive down to 89 Systolic, with AKI.   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   ?-Patient's urinalysis showed a cloudy appearance with amber-colored urine, moderate hemoglobin, negative ketones, moderate leukocytes, negative nitrites, many bacteria, greater than 50 RBCs per high-power field, greater than 50 WBCs ?-Chest x-ray suggest worsening pneumonia. ?-Follow-up blood culture result and sensitivities as it shows pansensitive E. coli ?-New Urine Cx showing >100,000 CFU of Enterococcus Faecalis with sensitivites pending but ? Colonization as WBC is improving; Regardless patient is on IV Unasyn which will cover given that Enterococcus is Pansensitive  ?-Continued IV cefepime and changed to IV ceftriaxone and this has been de-escalated to IV Unasyn and he is on Flagyl for the Colitis  ?-Prior urine cultures 12/2020 grew Enterobacter cloacae and Enterococcus faecalis, sensitive to cefepime. ?-IVF now stopped and he will get 1 unit of pRBC's ?-Follow cultures and adjust antibiotics as necessary and case was discussed with Dr. West Bali of ID who feels the patient is on appropriate Abx and recommends Unasyn and Flagyl for 10 days total.  ?

## 2021-04-11 NOTE — ED Notes (Signed)
Pt is refusing blood draws at this time. EDP made aware. ? ?

## 2021-04-11 NOTE — ED Provider Notes (Signed)
?[facility] ?Provider Note ? ? ?CSN: [id number] ?Arrival date & time: 04/11/21  1205 ? ?  ? ?History ? ?Chief Complaint  ?Patient presents with  ? Abnormal Lab  ? ? ?Allen Yu is a 76 y.o. male. ? ?HPI ?76 year old male presents back to the emergency department after being called due to positive blood culture.  History is primarily from the wife due to the patient's dementia.  He has been dealing with some hematuria and concern for UTI over the last 3 days or so.  A little bit of cough.  He was seen here yesterday and diagnosed with a urinary tract infection and recommended to be admitted but he and family declined.  They were called today due to positive blood culture for E. coli.  Foley catheter was changed yesterday while in the emergency department. ? ?Home Medications ?Prior to Admission medications   ?Medication Sig Start Date End Date Taking? Authorizing Provider  ?acetaminophen (TYLENOL) 500 MG tablet Take 500 mg by mouth 2 (two) times daily as needed for moderate pain. 03/07/21  Yes [provider]  ?Alcohol Swabs (ALCOHOL PADS) 70 % PADS USE 1 PAD AS DIRECTED 10/12/20 10/13/21 Yes [provider]  ?aspirin EC 81 MG tablet Take 1 tablet (81 mg total) by mouth daily with breakfast. 01/19/21 01/19/22 Yes Emokpae, Courage, MD  ?atorvastatin (LIPITOR) 40 MG tablet Take 40 mg by mouth daily.   Yes [provider]  ?clopidogrel (PLAVIX) 75 MG tablet Take 1 tablet (75 mg total) by mouth daily. 01/19/21 01/19/22 Yes Emokpae, Courage, MD  ?Dextromethorphan-GG-APAP (CORICIDIN HBP COLD/COUGH/FLU) 10-200-325 MG/15ML LIQD Take 30 mLs by mouth as needed (cough).   Yes [provider]  ?donepezil (ARICEPT) 10 MG tablet Take 10 mg by mouth daily. 06/28/16  Yes [provider]  ?feeding supplement, ENSURE COMPLETE, (ENSURE COMPLETE) LIQD Take 237 mLs by mouth 2 (two) times daily between meals. 01/19/21  Yes Roxan Hockey, MD  ?ferrous sulfate 325 (65 FE) MG  tablet Take 325 mg by mouth daily with breakfast. 03/07/21  Yes [provider]  ?gabapentin (NEURONTIN) 100 MG capsule Take 100 mg by mouth daily. 11/06/19  Yes [provider]  ?LANTUS 100 UNIT/ML injection Inject 0.06 mLs (6 Units total) into the skin at bedtime as needed (High blood glucose). If Blood glucose over 200 ?Patient taking differently: Inject 25-30 Units into the skin daily as needed (High blood glucose). If Blood glucose over 100-300 25units and if blood sugar is over 300 30units 01/19/21  Yes Emokpae, Courage, MD  ?losartan (COZAAR) 50 MG tablet Take 1 tablet (50 mg total) by mouth daily. ?Patient taking differently: Take 100 mg by mouth daily. 03/22/21 06/20/21 Yes Strader, Fransisco Hertz, PA-C  ?metoprolol succinate (TOPROL-XL) 50 MG 24 hr tablet Take 1 tablet (50 mg total) by mouth daily. Take with or immediately following a meal. 11/29/20 04/11/21 Yes Strader, Fransisco Hertz, PA-C  ?Multiple Vitamins-Minerals (MULTIVITAMIN WITH MINERALS) tablet Take 1 tablet by mouth daily. 01/19/21 01/19/22 Yes Emokpae, Courage, MD  ?omeprazole (PRILOSEC) 20 MG capsule Take 1 capsule by mouth daily. 03/23/21  Yes [provider]  ?ondansetron (ZOFRAN) 4 MG tablet Take 1 tablet (4 mg total) by mouth every 6 (six) hours as needed for nausea. 01/19/21  Yes Roxan Hockey, MD  ?risperiDONE (RISPERDAL) 0.5 MG tablet Take 1 tablet (0.5 mg total) by mouth every 12 (twelve) hours as needed (Restlessness and agitation). 01/19/21  Yes Roxan Hockey, MD  ?sulfamethoxazole-trimethoprim (BACTRIM DS) 800-160  MG tablet Take 1 tablet by mouth 2 (two) times daily for 7 days. 04/10/21 04/17/21 Yes Luna Fuse, MD  ?torsemide (DEMADEX) 20 MG tablet Take 1.5 tablets (30 mg total) by mouth daily. ?Patient taking differently: Take 10 mg by mouth daily. Take 10 mg daily 01/25/21  Yes Barton Dubois, MD  ?traMADol (ULTRAM) 50 MG tablet Take 50 mg by mouth every 6 (six) hours as needed for moderate pain. 07/18/20  Yes  [provider]  ?Vitamin D, Ergocalciferol, (DRISDOL) 1.25 MG (50000 UNIT) CAPS capsule Take 50,000 Units by mouth every Friday.   Yes [provider]  ?B-D UF III MINI PEN NEEDLES 31G X 5 MM MISC SMARTSIG:1 Each SUB-Q Daily 12/23/19   [provider]  ?mirtazapine (REMERON) 15 MG tablet Take 0.5 tablets (7.5 mg total) by mouth at bedtime. ?Patient not taking: Reported on 04/10/2021 01/19/21   Roxan Hockey, MD  ?   ? ?Allergies    ?Bee venom, Codeine, Propoxyphene, and Valsartan   ? ?Review of Systems   ?Review of Systems  ?Constitutional:  Positive for fever.  ?Respiratory:  Positive for cough.   ?Gastrointestinal:  Negative for abdominal pain.  ?Genitourinary:  Positive for hematuria.  ? ?Physical Exam ?Updated Vital Signs ?BP (!) 105/40   Pulse 99   Temp 97.6 ?F (36.4 ?C) (Oral)   Resp 18   Ht 6' (1.829 m)   Wt 59 kg   SpO2 100%   BMI 17.63 kg/m?  ?Physical Exam ?Vitals and nursing note reviewed.  ?Constitutional:   ?   Appearance: He is well-developed.  ?HENT:  ?   Head: Normocephalic and atraumatic.  ?Cardiovascular:  ?   Rate and Rhythm: Normal rate and regular rhythm.  ?   Heart sounds: Normal heart sounds.  ?Pulmonary:  ?   Effort: Pulmonary effort is normal.  ?   Breath sounds: Normal breath sounds.  ?Abdominal:  ?   General: There is no distension.  ?   Palpations: Abdomen is soft.  ?   Tenderness: There is no abdominal tenderness.  ?Genitourinary: ?   Comments: Foley catheter in place ?Skin: ?   General: Skin is warm and dry.  ?Neurological:  ?   Mental Status: He is alert.  ? ? ?ED Results / Procedures / Treatments   ?Labs ?(all labs ordered are listed, but only abnormal results are displayed) ?Labs Reviewed  ?LACTIC ACID, PLASMA - Abnormal; Notable for the following components:  ?    Result Value  ? Lactic Acid, Venous 2.2 (*)   ? All other components within normal limits  ?COMPREHENSIVE METABOLIC PANEL - Abnormal; Notable for the following components:  ? Sodium 128  (*)   ? Chloride 96 (*)   ? Glucose, Bld 274 (*)   ? BUN 87 (*)   ? Creatinine, Ser 3.15 (*)   ? Calcium 8.1 (*)   ? Albumin 3.0 (*)   ? Total Bilirubin 3.6 (*)   ? GFR, Estimated 20 (*)   ? All other components within normal limits  ?CBC WITH DIFFERENTIAL/PLATELET - Abnormal; Notable for the following components:  ? WBC 20.5 (*)   ? RBC 2.00 (*)   ? Hemoglobin 6.4 (*)   ? HCT 19.8 (*)   ? Neutro Abs 18.0 (*)   ? Monocytes Absolute 1.4 (*)   ? All other components within normal limits  ?PROTIME-INR - Abnormal; Notable for the following components:  ? Prothrombin Time 18.2 (*)   ? INR 1.5 (*)   ?  All other components within normal limits  ?APTT - Abnormal; Notable for the following components:  ? aPTT 37 (*)   ? All other components within normal limits  ?RESP PANEL BY RT-PCR (FLU A&B, COVID) ARPGX2  ?URINE CULTURE  ?LACTIC ACID, PLASMA  ?URINALYSIS, ROUTINE W REFLEX MICROSCOPIC  ?VITAMIN B12  ?FOLATE  ?IRON AND TIBC  ?FERRITIN  ?RETICULOCYTES  ?PREPARE RBC (CROSSMATCH)  ?TYPE AND SCREEN  ? ? ?EKG ?EKG Interpretation ? ?Date/Time:  Tuesday April 11 2021 13:17:12 EDT ?Ventricular Rate:  84 ?PR Interval:  145 ?QRS Duration: 99 ?QT Interval:  364 ?QTC Calculation: 431 ?R Axis:   -3 ?Text Interpretation: Sinus tachycardia Supraventricular bigeminy Low voltage, extremity leads Anteroseptal infarct, old Nonspecific T abnormalities, lateral leads Confirmed by Sherwood Gambler [phone]) on 04/11/2021 2:19:52 PM ? ?Radiology ?DG Chest Portable 1 View ? ?Result Date: 04/11/2021 ?CLINICAL DATA:  Cough. EXAM: PORTABLE CHEST 1 VIEW COMPARISON:  April 10, 2021. FINDINGS: Stable cardiomediastinal silhouette. Right lung is clear. Increased left basilar opacity is noted concerning for worsening pneumonia. Bony thorax is unremarkable. IMPRESSION: Increased left basilar opacity is noted concerning for worsening pneumonia. Electronically Signed   By: Marijo Conception M.D.   On: 04/11/2021 13:33  ? ?DG Chest Port 1 View ? ?Result Date:  04/10/2021 ?CLINICAL DATA:  Sepsis. EXAM: PORTABLE CHEST 1 VIEW COMPARISON:  01/23/2021 FINDINGS: Mild elevation of the left hemidiaphragm. Improved aeration at the lung bases compared to the previous examination. Arminda Resides

## 2021-04-11 NOTE — Assessment & Plan Note (Addendum)
-  Currently in sinus rhythm.  ?-Per notes, not on anticoagulation due to high risk for falls and low A-fib burden. ?-Was holding metoprolol 50 mg p.o. daily but given that he is little bradycardic we will hold off resuming  ? ?

## 2021-04-11 NOTE — Telephone Encounter (Signed)
This RN was made aware of positive blood cultures by micro lab. After speaking with Billy Fischer ED MD, she advises patient return to ED for follow up. When calling patient, wife answered and advised that they were getting ready to go to ED. ?

## 2021-04-11 NOTE — ED Notes (Signed)
Labs were finally obtained with patient cooperation. ? ?

## 2021-04-11 NOTE — Assessment & Plan Note (Addendum)
-  Reports a cough.   ?-He is afebrile, had leukocytosis of 20.5 but was trending down and went to 14.1 -> 11.1 and is now trending up -> 11.4 -> 12.1 ?-Chest x-ray suggest worsening pneumonia as it showed "Increased left basilar opacity is noted concerning for worsening Pneumonia." ?-Repeat CXR 04/16/21 showed "Cardiomegaly is again noted, and mild central vascular distention without overt edema. There is increasing layering moderate left pleural effusion and worsening consolidation in the left mid and lower lung field with increased opacity now extending to the mid field. Left apex and right lung remain clear. 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 ?-SpO2: 99 % ?-We will add flutter valve, incentive spirometry and guaifenesin 1200 mg p.o. twice daily ?-On Breathing Treatments but will make them Scheduled  ?-IV cefepime and azithromycin but this has been consolidated to IV Unasyn ?-IVF hydration  ?-Repeat chest x-ray again ?

## 2021-04-11 NOTE — Assessment & Plan Note (Addendum)
-  Recent A1c 12/2020- 8.8.   ?-On Semglee 10 units sq daily. Home Lantus is 25-30 units ?-C/w Sensitive Novolog SSI AC/HS ?-CBGs ranged from 144-227 ?

## 2021-04-11 NOTE — ED Notes (Signed)
Date and time results received: 04/11/21 1352 ? ?Test: LA ?Critical Value: 2.2 ? ?Name of Provider Notified: Dr.Goldston ? ?Orders Received? Or Actions Taken?: See orders ?

## 2021-04-11 NOTE — ED Notes (Signed)
Date and time results received: 04/11/21 1349 ? ?Test: Hbg ?Critical Value: 6.4 ? ?Name of Provider Notified: Dr. Regenia Skeeter ? ?Orders Received? Or Actions Taken?: See orders ?

## 2021-04-11 NOTE — ED Notes (Signed)
Patient transported to X-ray 

## 2021-04-11 NOTE — Assessment & Plan Note (Addendum)
-  Resume Clopidogrel 75 mg po Daily, Aspirin 81 mg po Daily, and Atorvastatin 40 mg po Daily  ?

## 2021-04-11 NOTE — Progress Notes (Signed)
Pharmacy Antibiotic Note ? ?Allen Yu is a 76 y.o. male admitted on 04/11/2021 with bacteremia and UTI.  Pharmacy has been consulted for cefepime dosing. ? ?Plan: ?Cefepime 2000 mg IV every 24 hours. ? ?Height: 6' (182.9 cm) ?Weight: 59 kg (130 lb) ?IBW/kg (Calculated) : 77.6 ? ?Temp (24hrs), Avg:97.6 ?F (36.4 ?C), Min:97.6 ?F (36.4 ?C), Max:97.6 ?F (36.4 ?C) ? ?Recent Labs  ?Lab 04/10/21 ?1126 04/10/21 ?1247 04/11/21 ?1311  ?WBC 17.9*  --  20.5*  ?CREATININE 2.56*  --  3.15*  ?LATICACIDVEN  --  1.9 2.2*  ?  ?Estimated Creatinine Clearance: 16.9 mL/min (A) (by C-G formula based on SCr of 3.15 mg/dL (H)).   ? ?Allergies  ?Allergen Reactions  ? Bee Venom Anaphylaxis  ? Codeine Other (See Comments)  ?  incoherent  ?Other reaction(s): Delirium  ? Propoxyphene Other (See Comments)  ?  Dizziness, "Makes me feel drunk" ?Other reaction(s): Dizziness  ? Valsartan Other (See Comments)  ?  incoherent ?Other reaction(s): Delirium  ? ? ?Antimicrobials this admission: ?Cefepime 3/21 >>  ?CTX 3/20  ? ? ?Microbiology results: ?3/20 BCx: pending ?BCID: e. coli ?3/21 UCx: pending  ? ? ?Thank you for allowing pharmacy to be a part of this patientAllens care. ? ?Margot Ables, PharmD ?Clinical Pharmacist ?04/11/2021 3:03 PM ? ? ?

## 2021-04-11 NOTE — Assessment & Plan Note (Addendum)
-  Acute on chronic anemia.   ?-Hemoglobin down to 6.4, recent baseline 7.5-8.9. Anemia panel obtained in ED prior to transfusion, ferritin elevated 662, iron indicis suggestive of anemia of chronic disease.  Folate and B12 levels normal.  ?-Spouse reports significant blood loss from catheter prior to admission  ?-He is status post transfusion 2 unit PRBC ?-Patient's hemoglobin/hematocrit went from 7.7/24.3 -> 8.1/24.3 -> 7.4/22.4 and could have been dilutional drop given that he is getting IV fluid hydration but it is now 7.9/24.3 -> 6.9/21.1 -> 8.3/25.5 ?-Continue monitor and trend and Monitor for signs and symptoms of bleeding; his gross hematuria looks like it is improved and resolved we will hold off urological consultation at this time  ?

## 2021-04-11 NOTE — Assessment & Plan Note (Addendum)
-  Stable and compensated.  Last echo 12/2020 EF 25 to 30%. ?-Discontinued IVF and will give Dose of IV Lasix 20 mg given his Plueral Effusion ?-Strict I's and O's and Daily Weights patient is +4.603 L ?-Holding torsemide, metoprolol, Imdur, losartan for now given worsened Renal Fxn and Hypotension and can resume slowly if able.  ?-Continue to monitor for signs and symptoms of volume overload and has a Pleural Effusion and have asked for a Thoracentesis and given him 20 mg x1 ?

## 2021-04-11 NOTE — H&P (Addendum)
?History and Physical  ? ? ?Allen Yu ZOX:[id number] DOB: [birth year] DOA: 04/11/2021 ? ?PCP: [name], MD  ? ?Patient coming from: Home ? ?I have personally briefly reviewed patient's old medical records in [place] ? ?Chief Complaint: Positive blood culture results ? ?HPI: Allen Yu is a 76 y.o. male with medical history significant for dementia, schizophrenia, PTSD, systolic CHF, chronic indwelling Foley catheter, paroxysmal atrial fibrillation, diabetes mellitus, hypertension, left above-knee amputation. ? ?Patient was in the ED yesterday with reports of hematuria.  Per notes patient's Foley catheter got caught on furniture and gave a tug 2 days prior. Bloody urine was noticed which had cleared up, but his urine remained dark so he was brought to the hospital.  WBC of 17.9.  Creatinine was 2.5.  The ED provider was concerned about a placated UTI with Foley catheter, 1 L fluid bolus and IV Rocephin was given, admission was recommended, but patient and wife declined, patient reported he had been in the hospital so much that he would rather " die at home if he has to".  Antibiotic prescription was given, patient left AGAINST MEDICAL ADVICE.  Foley was changed yesterday in the ED. ? ?Patient was called to come back to the ED because blood cultures were positive.  On my evaluation, patient has baseline dementia, he answers simple question that he has had a cough, he refuses to answer any further question or cooperate with exam requesting to be left alone and to cover his head with the bed covers. ? ?Talked to spouse, she reports patient has had a bad cough over the past 4 days.  No difficulty breathing.  Tmax of 99.5.  Patient would complain if he has any pain, she denies abdominal pain, no vomiting, had 1 episode of loose stool today.  He has not eaten in 4 days.  After trauma to his Foley she reports after the catheter days ago, he had bloody urine that cleared the next day.  After the Foley was  changed in the ED yesterday, this morning, the sheets were soaked with blood.  At baseline patient's can communicate and answer questions, he ambulates with a wheelchair. ? ?ED Course: Tmax 98.  Heart rate 70-100.  Respiratory rate 12-20.  Blood pressure systolic down to 81/19, improved currently 90s to low 100s. ?WBC 20.5.  Creatinine 3.15.  Sodium 128.  Lactic acidosis of 2.2 > 1.9.  Hemoglobin 6.5.  Chest x-ray shows worsening pneumonia.  Renal CT study suggest emphysematous cystitis and possible colitis. ?1 unit PRBC ordered for transfusion.  IV cefepime started.  500 mill Ringer's lactate given. ? ?Review of Systems: Limited exam due to patient's baseline dementia. ? ?Past Medical History:  ?Diagnosis Date  ? Anemia   ? Arthritis   ? Carotid stenosis   ? Chronic back pain   ? Chronic HFrEF (heart failure with reduced ejection fraction) ([place])   ? a. 09/2020 Echo: EF 45%, mild LVH; b. 12/2020 Echo: EF 25-30%, glob HK, mod LVH, mildly reduced RV fxn, RVSP 65.67mHg, mod BAE. Triv effusion. Mod MR/TR.  ? CKD (chronic kidney disease), stage III ([place]   ? Constipation   ? Dementia ([place]   ? Diabetes mellitus   ? Dilated cardiomyopathy ([place]   ? a. 09/2020 Echo: EF 45%; b. 12/2020 Echo: EF 25-30%.  ? GERD (gastroesophageal reflux disease)   ? History of kidney stones   ? Hypertension   ? Lung nodule   ? PAF (paroxysmal atrial fibrillation) ([place]   ?  Peripheral neuropathy   ? Peripheral vascular disease ([place])   ? a. 06/2020 s/p L AKA; b. 10/2020 s/p R SFA and above-knee popliteal PTA.  ? PTSD (post-traumatic stress disorder)   ? Schizophrenia ([place])   ? Tuberculosis   ? Treated  ? ? ?Past Surgical History:  ?Procedure Laterality Date  ? ABDOMINAL AORTOGRAM W/LOWER EXTREMITY N/A 05/20/2020  ? Procedure: ABDOMINAL AORTOGRAM W/LOWER EXTREMITY;  Surgeon: Elam Dutch, MD;  Location: [facility];  Service: Cardiovascular;  Laterality: N/A;  ? ABDOMINAL AORTOGRAM W/LOWER EXTREMITY N/A 10/27/2020  ? Procedure: ABDOMINAL  AORTOGRAM W/LOWER EXTREMITY;  Surgeon: Marty Heck, MD;  Location: [facility];  Service: Cardiovascular;  Laterality: N/A;  ? AMPUTATION Left 06/28/2020  ? Procedure: AMPUTATION ABOVE KNEE LEFT;  Surgeon: Rosetta Posner, MD;  Location: [facility];  Service: Vascular;  Laterality: Left;  ? APPENDECTOMY    ? BACK SURGERY  2000, 2013  ? x2  ? BLADDER SURGERY    ? 02  ? CHOLECYSTECTOMY    ? COLONOSCOPY    ? COLONOSCOPY N/A 12/07/2014  ? Procedure: COLONOSCOPY;  Surgeon: Daneil Dolin, MD;  [facility];  Service: Endoscopy;  Laterality: N/A;  10:30 Am  ? EYE SURGERY Bilateral   ? removed metal from eye  ? FEMORAL-TIBIAL BYPASS GRAFT Left 05/27/2020  ? Procedure: LEFT FEMORAL TO PERONEAL ARTERY BYPASS;  Surgeon: Rosetta Posner, MD;  Location: [facility];  Service: Vascular;  Laterality: Left;  ? HERNIA REPAIR Right   ? INGUINAL HERNIA REPAIR Left 11/03/2013  ? Procedure: HERNIA REPAIR INGUINAL ADULT;  Surgeon: Gayland Curry, MD;  Location: [facility];  Service: General;  Laterality: Left;  ? PERIPHERAL VASCULAR INTERVENTION  10/27/2020  ? Procedure: PERIPHERAL VASCULAR INTERVENTION;  Surgeon: Marty Heck, MD;  Location: [facility];  Service: Cardiovascular;;  ? SHOULDER SURGERY    ? RIGHT SHOULDER   ? VIDEO BRONCHOSCOPY WITH ENDOBRONCHIAL NAVIGATION N/A 07/10/2019  ? Procedure: VIDEO BRONCHOSCOPY WITH ENDOBRONCHIAL NAVIGATION;  Surgeon: Melrose Nakayama, MD;  Location: [facility];  Service: Thoracic;  Laterality: N/A;  ? ? ? reports that he has been smoking cigarettes. He has been smoking an average of .5 packs per day. He has never used smokeless tobacco. He reports current drug use. Drug: Marijuana. He reports that he does not drink alcohol. ? ?Allergies  ?Allergen Reactions  ? Bee Venom Anaphylaxis  ? Codeine Other (See Comments)  ?  incoherent  ?Other reaction(s): Delirium  ? Propoxyphene Other (See Comments)  ?  Dizziness, "Makes me feel drunk" ?Other reaction(s): Dizziness  ? Valsartan Other  (See Comments)  ?  incoherent ?Other reaction(s): Delirium  ? ? ?Family History  ?Problem Relation Age of Onset  ? Heart disease Mother   ?     before age 38  ? ? ?Prior to Admission medications   ?Medication Sig Start Date End Date Taking? Authorizing Provider  ?acetaminophen (TYLENOL) 500 MG tablet Take 500 mg by mouth 2 (two) times daily as needed for moderate pain. 03/07/21  Yes [provider]  ?Alcohol Swabs (ALCOHOL PADS) 70 % PADS USE 1 PAD AS DIRECTED 10/12/20 10/13/21 Yes [provider]  ?aspirin EC 81 MG tablet Take 1 tablet (81 mg total) by mouth daily with breakfast. 01/19/21 01/19/22 Yes Zerick Prevette, Courage, MD  ?atorvastatin (LIPITOR) 40 MG tablet Take 40 mg by mouth daily.   Yes [provider]  ?clopidogrel (PLAVIX) 75 MG tablet Take  1 tablet (75 mg total) by mouth daily. 01/19/21 01/19/22 Yes Greysin Medlen, Courage, MD  ?Dextromethorphan-GG-APAP (CORICIDIN HBP COLD/COUGH/FLU) 10-200-325 MG/15ML LIQD Take 30 mLs by mouth as needed (cough).   Yes [provider]  ?donepezil (ARICEPT) 10 MG tablet Take 10 mg by mouth daily. 06/28/16  Yes [provider]  ?feeding supplement, ENSURE COMPLETE, (ENSURE COMPLETE) LIQD Take 237 mLs by mouth 2 (two) times daily between meals. 01/19/21  Yes Roxan Hockey, MD  ?ferrous sulfate 325 (65 FE) MG tablet Take 325 mg by mouth daily with breakfast. 03/07/21  Yes [provider]  ?gabapentin (NEURONTIN) 100 MG capsule Take 100 mg by mouth daily. 11/06/19  Yes [provider]  ?LANTUS 100 UNIT/ML injection Inject 0.06 mLs (6 Units total) into the skin at bedtime as needed (High blood glucose). If Blood glucose over 200 ?Patient taking differently: Inject 25-30 Units into the skin daily as needed (High blood glucose). If Blood glucose over 100-300 25units and if blood sugar is over 300 30units 01/19/21  Yes Porchea Charrier, Courage, MD  ?losartan (COZAAR) 50 MG tablet Take 1 tablet (50 mg total) by mouth daily. ?Patient taking  differently: Take 100 mg by mouth daily. 03/22/21 06/20/21 Yes Strader, Fransisco Hertz, PA-C  ?metoprolol succinate (TOPROL-XL) 50 MG 24 hr tablet Take 1 tablet (50 mg total) by mouth daily. Take with or imm

## 2021-04-11 NOTE — Plan of Care (Signed)
Pt admitted for sepsis. Pt refused care. Would not let team get vital signs or blood sugar. This recorder was barely able to get pts. Blood transfusion stopped without patient threatening physical violence. Pt refused tele and became angry when attempting to start IV fluids.Dr. made aware. Pt in lowest position, 2/4 rails up, brakes on, call bell near by. No other needs voiced at this time except to leave him alone he is trying to sleep. ?NATHANAL HERMIZ ?04/11/21 ?11:33 PM ? ?

## 2021-04-11 NOTE — Assessment & Plan Note (Addendum)
-  Initially he was agitated and could be from the Bacteremia.  Spouse reports patient has had to be restrained in the past. ?-IV Ativan 0.5 as needed and may need Psych consult if continues to worsen and we will need to notify telemetry psych.;  He is very agitated today and almost was physical with the nurse so if he continues to be like this will give him IM Haldol ?-Delirium Precautions ?-Continues to be withdrawn but a little bit more interactive today.   ?-Continue monitor closely as he has been intermittently agitated but appropriate today  ?

## 2021-04-12 ENCOUNTER — Ambulatory Visit (HOSPITAL_COMMUNITY): Payer: Medicare Other | Admitting: Physical Therapy

## 2021-04-12 DIAGNOSIS — F209 Schizophrenia, unspecified: Secondary | ICD-10-CM

## 2021-04-12 DIAGNOSIS — E1159 Type 2 diabetes mellitus with other circulatory complications: Secondary | ICD-10-CM

## 2021-04-12 DIAGNOSIS — K529 Noninfective gastroenteritis and colitis, unspecified: Secondary | ICD-10-CM

## 2021-04-12 DIAGNOSIS — N179 Acute kidney failure, unspecified: Secondary | ICD-10-CM | POA: Diagnosis not present

## 2021-04-12 DIAGNOSIS — D649 Anemia, unspecified: Secondary | ICD-10-CM | POA: Diagnosis not present

## 2021-04-12 DIAGNOSIS — R7881 Bacteremia: Secondary | ICD-10-CM | POA: Diagnosis not present

## 2021-04-12 DIAGNOSIS — I739 Peripheral vascular disease, unspecified: Secondary | ICD-10-CM

## 2021-04-12 DIAGNOSIS — I5022 Chronic systolic (congestive) heart failure: Secondary | ICD-10-CM

## 2021-04-12 DIAGNOSIS — N1832 Chronic kidney disease, stage 3b: Secondary | ICD-10-CM | POA: Diagnosis not present

## 2021-04-12 LAB — BASIC METABOLIC PANEL
Anion gap: 12 (ref 5–15)
BUN: 95 mg/dL — ABNORMAL HIGH (ref 8–23)
CO2: 23 mmol/L (ref 22–32)
Calcium: 8.9 mg/dL (ref 8.9–10.3)
Chloride: 100 mmol/L (ref 98–111)
Creatinine, Ser: 3.44 mg/dL — ABNORMAL HIGH (ref 0.61–1.24)
GFR, Estimated: 18 mL/min — ABNORMAL LOW (ref >60)
Glucose, Bld: 171 mg/dL — ABNORMAL HIGH (ref 70–99)
Potassium: 3.5 mmol/L (ref 3.5–5.1)
Sodium: 135 mmol/L (ref 135–145)

## 2021-04-12 LAB — TYPE AND SCREEN
ABO/RH(D): O POS
Antibody Screen: NEGATIVE
Unit division: 0

## 2021-04-12 LAB — CBC
HCT: 24.3 % — ABNORMAL LOW (ref 39.0–52.0)
Hemoglobin: 8.1 g/dL — ABNORMAL LOW (ref 13.0–17.0)
MCH: 31.8 pg (ref 26.0–34.0)
MCHC: 33.3 g/dL (ref 30.0–36.0)
MCV: 95.3 fL (ref 80.0–100.0)
Platelets: 290 10*3/uL (ref 150–400)
RBC: 2.55 MIL/uL — ABNORMAL LOW (ref 4.22–5.81)
RDW: 14.6 % (ref 11.5–15.5)
WBC: 20.3 10*3/uL — ABNORMAL HIGH (ref 4.0–10.5)
nRBC: 0 % (ref 0.0–0.2)

## 2021-04-12 LAB — GLUCOSE, CAPILLARY
Glucose-Capillary: 242 mg/dL — ABNORMAL HIGH (ref 70–99)
Glucose-Capillary: 184 mg/dL — ABNORMAL HIGH (ref 70–99)

## 2021-04-12 LAB — BPAM RBC
Blood Product Expiration Date: 202304242359
ISSUE DATE / TIME: 202303211756
Unit Type and Rh: 5100

## 2021-04-12 MED ORDER — SODIUM CHLORIDE 0.9 % IV SOLN
2.0000 g | INTRAVENOUS | Status: DC
  Administered 2021-04-12: 2 g via INTRAVENOUS
  Filled 2021-04-12: qty 20

## 2021-04-12 MED ORDER — ADULT MULTIVITAMIN W/MINERALS CH
1.0000 | ORAL_TABLET | Freq: Every day | ORAL | Status: DC
  Administered 2021-04-12 – 2021-04-17 (×6): 1 via ORAL
  Filled 2021-04-12 (×6): qty 1

## 2021-04-12 MED ORDER — SODIUM CHLORIDE 0.9 % IV SOLN: INTRAVENOUS | Status: DC

## 2021-04-12 MED ORDER — GUAIFENESIN ER 600 MG PO TB12
1200.0000 mg | ORAL_TABLET | Freq: Two times a day (BID) | ORAL | Status: DC
  Administered 2021-04-12 – 2021-04-16 (×9): 1200 mg via ORAL
  Filled 2021-04-12 (×10): qty 2

## 2021-04-12 MED ORDER — ENSURE ENLIVE PO LIQD
237.0000 mL | Freq: Three times a day (TID) | ORAL | Status: DC
  Administered 2021-04-12 – 2021-04-16 (×7): 237 mL via ORAL

## 2021-04-12 MED ORDER — METRONIDAZOLE 500 MG/100ML IV SOLN
500.0000 mg | Freq: Two times a day (BID) | INTRAVENOUS | Status: DC
  Administered 2021-04-12 – 2021-04-17 (×9): 500 mg via INTRAVENOUS
  Filled 2021-04-12 (×9): qty 100

## 2021-04-12 MED ORDER — RISPERIDONE 0.5 MG PO TABS
0.5000 mg | ORAL_TABLET | Freq: Two times a day (BID) | ORAL | Status: DC | PRN
  Administered 2021-04-12 – 2021-04-16 (×4): 0.5 mg via ORAL
  Filled 2021-04-12 (×5): qty 1

## 2021-04-12 MED ORDER — CHLORHEXIDINE GLUCONATE CLOTH 2 % EX PADS
6.0000 | MEDICATED_PAD | Freq: Every day | CUTANEOUS | Status: DC
  Administered 2021-04-13 – 2021-04-16 (×4): 6 via TOPICAL

## 2021-04-12 NOTE — Progress Notes (Signed)
Initial Nutrition Assessment ? ?DOCUMENTATION CODES:  ? ?Underweight ? ?INTERVENTION:  ?Ensure Enlive po TID, due to minimal meal intake  ? ?Provide tray set-up and encourage completion ? ?NUTRITION DIAGNOSIS:  ? ?Inadequate oral intake related to acute illness as evidenced by meal completion < 50%. ? ? ?GOAL:  ?Patient will meet greater than or equal to 90% of their needs ? ? ?MONITOR:  ?Supplement acceptance, PO intake, Labs, Weight trends ? ?REASON FOR ASSESSMENT:  ? ?Malnutrition Screening Tool ?  ? ?ASSESSMENT: Patient is a underweight 76 yo male with hx of  HTN, DM2, CKD-3A-B, HF, GERD, Anemia, Dementia, PVD, constipation and PTSD. ? ?Presents with AKI, bacteremia and UTI. Positive blood cultures (E.Coli). Chronic foley.  ? ?Patient in bed with head covered during RD visit. Unsuccessful attempt to wake pt to discuss state of his appetite. Dementia at baseline unclear the level of severity. Talked with nurse who reports bites of potatoes and coffee at breakfast. Lunch tray in room- a few bites of [place] and mashed potatoes ~ 25% of meal completed. Suspect malnutrition but unable to clearly define presently. ? ?Medications: aricept, insulin (novolog and semglee) ? ?CBG (last 3)  ?No results for input(s): GLUCAP in the last 72 hours.  ? ?Weights- current 59 kg, last month wt 54.4 kg. Highly variable- January 49.6 kg, December 52.6 kg. Hx of HF.  ? ?Labs: Hgb. 6.4 (1 unit PRBC) ? ?  Latest Ref Rng & Units 04/12/2021  ?  5:30 AM 04/11/2021  ?  1:11 PM 04/10/2021  ? 11:26 AM  ?BMP  ?Glucose 70 - 99 mg/dL 171   274   150    ?BUN 8 - 23 mg/dL 95   87   64    ?Creatinine 0.61 - 1.24 mg/dL 3.44   3.15   2.56    ?Sodium 135 - 145 mmol/L 135   128   137    ?Potassium 3.5 - 5.1 mmol/L 3.5   3.6   4.4    ?Chloride 98 - 111 mmol/L 100   96   99    ?CO2 22 - 32 mmol/L '23   22   26    '$ ?Calcium 8.9 - 10.3 mg/dL 8.9   8.1   9.1    ?   ? ?NUTRITION - FOCUSED PHYSICAL EXAM: ?Unable to complete Nutrition-Focused physical exam at this  time.   ? ?Diet Order:   ?Diet Order   ? ?       ?  Diet heart healthy/carb modified Room service appropriate? Yes; Fluid consistency: Thin  Diet effective now       ?  ? ?  ?  ? ?  ? ? ?EDUCATION NEEDS:  ?Not appropriate for education at this time ? ?Skin:  Skin Assessment: Reviewed RN Assessment ? ?Last BM:  unknown ? ?Height:  ? ?Ht Readings from Last 1 Encounters:  ?04/11/21 6' (1.829 m)  ? ? ?Weight:  ? ?Wt Readings from Last 1 Encounters:  ?04/11/21 59 kg  ? ? ?Ideal Body Weight:   73 kg ? ?BMI:  Body mass index is 17.63 kg/m?. ? ?Estimated Nutritional Needs:  ? ?Kcal:  1800-2000 ? ?Protein:  70-75 gr ? ?Fluid:  < 2 liters daily ? ? ?Colman Cater MS,RD,CSG,LDN ?Contact: AMION ? ?

## 2021-04-12 NOTE — Progress Notes (Signed)
RT went to see patient to instruct on IS and flutter. Patient was asleep RT will check back. ?

## 2021-04-12 NOTE — Progress Notes (Signed)
?PROGRESS NOTE ? ? ? ?Allen Yu  RFF:[id number] DOB: September 29, 1945 DOA: 04/11/2021 ?PCP: [name], MD  ? ?Brief Narrative:  ?HPI per Dr. Jenetta Downer on 04/11/21 ? Allen Yu is a 76 y.o. male with medical history significant for dementia, schizophrenia, PTSD, systolic CHF, chronic indwelling Foley catheter, paroxysmal atrial fibrillation, diabetes mellitus, hypertension, left above-knee amputation. ?  ?Patient was in the ED yesterday with reports of hematuria.  Per notes patient's Foley catheter got caught on furniture and gave a tug 2 days prior. Bloody urine was noticed which had cleared up, but his urine remained dark so he was brought to the hospital.  WBC of 17.9.  Creatinine was 2.5.  The ED provider was concerned about a placated UTI with Foley catheter, 1 L fluid bolus and IV Rocephin was given, admission was recommended, but patient and wife declined, patient reported he had been in the hospital so much that he would rather " die at home if he has to".  Antibiotic prescription was given, patient left AGAINST MEDICAL ADVICE.  Foley was changed yesterday in the ED. ?  ?Patient was called to come back to the ED because blood cultures were positive.  On my evaluation, patient has baseline dementia, he answers simple question that he has had a cough, he refuses to answer any further question or cooperate with exam requesting to be left alone and to cover his head with the bed covers. ?  ?Talked to spouse, she reports patient has had a bad cough over the past 4 days.  No difficulty breathing.  Tmax of 99.5.  Patient would complain if he has any pain, she denies abdominal pain, no vomiting, had 1 episode of loose stool today.  He has not eaten in 4 days.  After trauma to his Foley she reports after the catheter days ago, he had bloody urine that cleared the next day.  After the Foley was changed in the ED yesterday, this morning, the sheets were soaked with blood.  At baseline patient's can  communicate and answer questions, he ambulates with a wheelchair. ?  ?ED Course: Tmax 98.  Heart rate 70-100.  Respiratory rate 12-20.  Blood pressure systolic down to 35/70, improved currently 90s to low 100s. ?WBC 20.5.  Creatinine 3.15.  Sodium 128.  Lactic acidosis of 2.2 > 1.9.  Hemoglobin 6.5.  Chest x-ray shows worsening pneumonia.  Renal CT study suggest emphysematous cystitis and possible colitis. ?1 unit PRBC ordered for transfusion.  IV cefepime started.  500 mill Ringer's lactate given. ? ?**Interim History ?Blood cultures have come back as E. coli with sensitivities pending.  We have de-escalated his antibiotics from cefepime to ceftriaxone.  Foley catheter was changed in the ED.  Patient was little confused and pulled out his IV this morning but is reconnected.  WBC remains elevated but lactic acid level has improved ?   ? ? ?Assessment and Plan: ?* Bacteremia ?-Blood cultures done yesterday 3/20-rapid ID panel positive for E. coli.   ?-Likely source from patient's chronic indwelling Foley catheter.   ?-Urine culture 3/20 - multiple species present, recollection suggested.  -  ?-He has significant leukocytosis of 20.5 with repeat being 20.3. With lactic acid of 2.2 > 1.9.  Also hypotensive down to 89 Systolic, with AKI.   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   ?-Patient's urinalysis showed a cloudy appearance with amber-colored urine, moderate hemoglobin, negative ketones, moderate leukocytes, negative nitrites, many bacteria, greater than  50 RBCs per high-power field, greater than 50 WBCs ?-Chest x-ray suggest worsening pneumonia. ?-Follow-up blood culture result and sensitivities ?-Obtain new urine cultures ?-Continued IV cefepime and changed to IV ceftriaxone ?-Prior urine cultures 12/2020 grew Enterobacter cloacae and Enterococcus faecalis, sensitive to cefepime. ?-500 mill bolus given, continue N/s 50cc/hr x 15hrs however have resumed his IV fluids for  another 1 day and 50 cc/h given his history of chronic systolic CHF ?-Follow cultures and adjust antibiotics as necessary and if persist may need ID involvement. ? ?Pneumonia ?-Reports a cough.   ?-He is afebrile, has leukocytosis of 20.5.   ?-Chest x-ray suggest worsening pneumonia as it showed "Increased left basilar opacity is noted concerning for worsening Pneumonia." ?-His O2 sats greater than 95% on room air. ?-SpO2: 92 % ?-We will add flutter valve, incentive spirometry and guaifenesin 1200 mg p.o. twice daily ?-C/w IV cefepime and azithromycin ?-Resume Gentle IVF Hydration with NS at 46m/hr x 1 day ? ?Schizophrenia, unspecified ([place] ?-Has been Agitated recently so we will need to continue to monitor ?-We will resume his risperidone 1 tablet every 12 hours as needed as needed for restlessness and agitation ? ?Emphysematous cystitis ?-Suggested on renal CT stone study.   ?-Blood cultures growing E. Coli.   ?-Has chronic indwelling Foley catheter- Foley was changed yesterday-3/20 in ED. ?-IV antibiotics for now ?-Resume gentle IVF hydration ?-May benefit from urology in-put ? ?Anemia ?-Acute on chronic anemia.   ?-Hemoglobin down to 6.4, recent baseline 7.5-8.9. Anemia panel obtained in ED prior to transfusion, ferritin elevated 662, iron indicis suggestive of anemia of chronic disease.  Folate and B12 levels normal.  ?-Spouse reports significant blood loss from catheter yesterday morning. ?-He is status post transfusion 1 unit PRBC ? ?Acute kidney injury superimposed on chronic kidney disease ([place] ?-Creatinine 3.15 and is continue to worsen, baseline 1.3-1.6. Baseline CKD 3A-B.  ?Creatinine has increased to 2.5, from when he was in the ED yesterday.  Barely any oral intake in the past 4 days. ?-Hydrate with 50 MLS per hour for another 1 day ?-Hold losartan, torsemide for now ?-Avoid nephrotoxic medications, contrast dyes, hypotension and renally dose medications ?-Foley catheter has been exchanged. ?-CT  Renal Stone study showed "Compared to 07/21/2020, there is again air throughout the urinary ?bladder wall and also air within the urinary bladder lumen. 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 Air is no longer visualized within the urethra, anterior peritoneal, and lateral pelvic sidewalls. Recommend clinical correlation. Resolution of the prior mild bilateral hydronephrosis. There is again mild-to-moderate circumferential wall thickening of the rectum. New decompression and mild circumferential wall thickening of the entire sigmoid colon in the majority of the descending colon. This is suggestive of colitis. New elevation of the left hemidiaphragm. This may correspond to the volume loss and opacity concerning for pneumonia on recent radiographs as well as 01/23/2021 radiograph.  There is moderate cardiomegaly new compared to 07/21/2020 CT. This change is also seen on 04/11/2021 versus 07/21/2020 radiographs." ? ?Chronic systolic CHF (congestive heart failure) ([place] ?-Stable and compensated.  Last echo 12/2020 EF 25 to 30%. ?-Gentle fluids resumed ?-Strict I's and O's and Daily Weights ?-Hold torsemide, metoprolol, Imdur, losartan for now given worsened Renal Fxn and Hypotension ? ?Peripheral vascular disease ([place] ?-Resume Clopidogrel 75 mg po Daily, Aspirin 81 mg po Daily, and Atorvastatin 40 mg po Daily  ? ?Unspecified atrial fibrillation ([place] ?-Currently in sinus rhythm.  ?-Per notes, not on anticoagulation due to high risk for falls and low  A-fib burden. ?-Holding Metoprolol 50 mg daily for now with Hypotension of 89/68 ?-Now BP is 110/76 ? ?Colitis ?-WBC went from 17.9 -> 20.5 -> 20.3 ?-CT Renal Stone Study showed "There is again mild-to-moderate circumferential wall thickening of the rectum. New decompression and mild circumferential wall thickening of the entire sigmoid colon in the majority of the descending colon. This is suggestive of colitis." ?-He is de-escalated from IV cefepime to IV  ceftriaxone but will add IV Flagyl for his colitis  ? ?Unspecified dementia, unspecified severity, without behavioral disturbance, psychotic disturbance, mood disturbance, and anxiety ([place]) ?-Getting quite agitated and

## 2021-04-12 NOTE — Hospital Course (Addendum)
HPI per Dr. Jenetta Downer on 04/11/21 ? Allen Yu is a 76 y.o. male with medical history significant for dementia, schizophrenia, PTSD, systolic CHF, chronic indwelling Foley catheter, paroxysmal atrial fibrillation, diabetes mellitus, hypertension, left above-knee amputation. ?  ?Patient was in the ED yesterday with reports of hematuria.  Per notes patient's Foley catheter got caught on furniture and gave a tug 2 days prior. Bloody urine was noticed which had cleared up, but his urine remained dark so he was brought to the hospital.  WBC of 17.9.  Creatinine was 2.5.  The ED provider was concerned about a placated UTI with Foley catheter, 1 L fluid bolus and IV Rocephin was given, admission was recommended, but patient and wife declined, patient reported he had been in the hospital so much that he would rather " die at home if he has to".  Antibiotic prescription was given, patient left AGAINST MEDICAL ADVICE.  Foley was changed yesterday in the ED. ?  ?Patient was called to come back to the ED because blood cultures were positive.  On my evaluation, patient has baseline dementia, he answers simple question that he has had a cough, he refuses to answer any further question or cooperate with exam requesting to be left alone and to cover his head with the bed covers. ?  ?Talked to spouse, she reports patient has had a bad cough over the past 4 days.  No difficulty breathing.  Tmax of 99.5.  Patient would complain if he has any pain, she denies abdominal pain, no vomiting, had 1 episode of loose stool today.  He has not eaten in 4 days.  After trauma to his Foley she reports after the catheter days ago, he had bloody urine that cleared the next day.  After the Foley was changed in the ED yesterday, this morning, the sheets were soaked with blood.  At baseline patient's can communicate and answer questions, he ambulates with a wheelchair. ?  ?ED Course: Tmax 98.  Heart rate 70-100.  Respiratory rate 12-20.   Blood pressure systolic down to 01/74, improved currently 90s to low 100s. ?WBC 20.5.  Creatinine 3.15.  Sodium 128.  Lactic acidosis of 2.2 > 1.9.  Hemoglobin 6.5.  Chest x-ray shows worsening pneumonia.  Renal CT study suggest emphysematous cystitis and possible colitis. ?1 unit PRBC ordered for transfusion.  IV cefepime started.  500 mill Ringer's lactate given. ? ?**Interim History ?Blood cultures have come back as E. coli with sensitivities that are pansensitive.  We have de-escalated his antibiotics from cefepime to ceftriaxone yesterday and will further consolidate his antibiotics to Unasyn and Flagyl.  Foley catheter was changed in the ED.  Patient was little confused and pulled out his IV this morning but is reconnected.  WBC remains but has been improving and trended down from 20.5 is now 14.1. ? ?Unfortunately renal function has not really improved so we will consult nephrology for further evaluation recommendations and continue gentle IV fluid hydration at 50 cc for now.  Nephrology Dr. Royce Macadamia to see the patient in the morning ? ?04/14/21: Overnight he pulled out his IV and was very agitated.  Refuses blood work this morning however is now improving after he finally agreed.  Nephrology feels that his AKI is in the setting of his bacteremia and Foley trauma as well as recently given Bactrim.  They are recommending holding his ARB and his Foley has been changed.  They are recommending also continue to hold his torsemide and hold  his diuretics.  He is not a candidate for dialysis should his renal function continue to worsen.  He was given low-dose IV fluids and will continue for now.  BUN/creatinine is slightly improved from yesterday.  We will need to continue monitor and continue to treat his E. coli bacteremia and  Enterococcus UTI. ?  ?04/15/21: Hemoglobin/hematocrit dropped slightly to 6.9/21.1 and we will type and screen and transfuse another 1 unit PRBCs.  His IV fluid hydration has now stopped.  Renal  function continues to improve daily and BUN/creatinine was 68/2.49.  I had a very lengthy discussion with the ID physician who recommends continuing the IV Unasyn and Flagyl and changing to p.o. Augmentin and Flagyl at discharge for total of 10 days of treatment.  Will order PT OT to further evaluate and treat anticipate discharging the patient home the next 24 to 48 hours if stable and if his creatinine continues to improve and blood count is stable. ? ?04/16/21: PT Evaluated and recommended Home Health. WBC is slightly trending up and went from 11.4 -> 12.1. BUN/Cr trending down and went from 68/2.49 -> 49/1.98. CXR showed "Worsening consolidation and underlying pleural effusion left ?mid/lower zone. Stable cardiomegaly." Will obtain Thoracentesis and C/w Xopenex and Atrovent for Consolidation.  ?

## 2021-04-12 NOTE — Assessment & Plan Note (Addendum)
-  Has been Agitated recently so we will need to continue to monitor ?-Resumed Risperidone 1 tablet every 12 hours as needed as needed for restlessness and agitation ?-May need Haldol and psych consult but he has been intermittently agitated ?

## 2021-04-12 NOTE — Assessment & Plan Note (Addendum)
-  WBC went from 17.9 -> 20.5 -> 20.3 and is now 14.1 -> 11.1 -> 11.4 -> 12.1 ?-CT Renal Stone Study showed "There is again mild-to-moderate circumferential wall thickening of the rectum. New decompression and mild circumferential wall thickening of the entire sigmoid colon in the majority of the descending colon. This is suggestive of colitis." ?-He is de-escalated from IV cefepime to IV ceftriaxone but will add IV Flagyl for his colitis; now antibiotics have been changed to IV Unasyn and will continue IV Flagyl for now and per ID will continue for now and complete a total of 10 days of Abx (When he goes home Unasyn will be changed to Augmentin) ?-Patient was complaining of some mild mid abdominal pain today we will need to continue to monitor and continue with his pain regimen ?

## 2021-04-12 NOTE — Assessment & Plan Note (Addendum)
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 Baseline Cr in the 1's ?-Patient's BUNs/creatinine has worsened and went from 54/2.56 -> 95/3.44 -> 93/3.42 -> 75/2.96 -> 77/2.95 -> 68/2.49 -> 49/1.98 ?-IVF hydration now stopped  ?-Avoid further nephrotoxic medications, contrast dyes, hypotension and renally dose medications and continue to hold his torsemide and losartan. ?-CT renal stone study done and showed no hydronephrosis ?-Urine sodium and urine creatinine as well as urine Osm ?-Nephrology consulted and we gave IVF and recommending Supportive Care for now ?-Not a Dialysis candidate ?-Repeat CMP in the a.m. ?

## 2021-04-13 ENCOUNTER — Other Ambulatory Visit: Payer: Self-pay

## 2021-04-13 DIAGNOSIS — E871 Hypo-osmolality and hyponatremia: Secondary | ICD-10-CM

## 2021-04-13 DIAGNOSIS — D649 Anemia, unspecified: Secondary | ICD-10-CM | POA: Diagnosis not present

## 2021-04-13 DIAGNOSIS — R7881 Bacteremia: Secondary | ICD-10-CM | POA: Diagnosis not present

## 2021-04-13 DIAGNOSIS — I5022 Chronic systolic (congestive) heart failure: Secondary | ICD-10-CM | POA: Diagnosis not present

## 2021-04-13 DIAGNOSIS — E876 Hypokalemia: Secondary | ICD-10-CM

## 2021-04-13 DIAGNOSIS — I4891 Unspecified atrial fibrillation: Secondary | ICD-10-CM

## 2021-04-13 DIAGNOSIS — N179 Acute kidney failure, unspecified: Secondary | ICD-10-CM | POA: Diagnosis not present

## 2021-04-13 LAB — CBC WITH DIFFERENTIAL/PLATELET
Abs Immature Granulocytes: 0.24 10*3/uL — ABNORMAL HIGH (ref 0.00–0.07)
Basophils Absolute: 0 10*3/uL (ref 0.0–0.1)
Basophils Relative: 0 %
Eosinophils Absolute: 0.5 10*3/uL (ref 0.0–0.5)
Eosinophils Relative: 3 %
HCT: 22.4 % — ABNORMAL LOW (ref 39.0–52.0)
Hemoglobin: 7.4 g/dL — ABNORMAL LOW (ref 13.0–17.0)
Immature Granulocytes: 2 %
Lymphocytes Relative: 9 %
Lymphs Abs: 1.3 10*3/uL (ref 0.7–4.0)
MCH: 31.2 pg (ref 26.0–34.0)
MCHC: 33 g/dL (ref 30.0–36.0)
MCV: 94.5 fL (ref 80.0–100.0)
Monocytes Absolute: 1.8 10*3/uL — ABNORMAL HIGH (ref 0.1–1.0)
Monocytes Relative: 13 %
Neutro Abs: 10.3 10*3/uL — ABNORMAL HIGH (ref 1.7–7.7)
Neutrophils Relative %: 73 %
Platelets: 265 10*3/uL (ref 150–400)
RBC: 2.37 MIL/uL — ABNORMAL LOW (ref 4.22–5.81)
RDW: 14.3 % (ref 11.5–15.5)
WBC: 14.1 10*3/uL — ABNORMAL HIGH (ref 4.0–10.5)
nRBC: 0 % (ref 0.0–0.2)

## 2021-04-13 LAB — COMPREHENSIVE METABOLIC PANEL
ALT: 20 U/L (ref 0–44)
AST: 27 U/L (ref 15–41)
Albumin: 2.7 g/dL — ABNORMAL LOW (ref 3.5–5.0)
Alkaline Phosphatase: 92 U/L (ref 38–126)
Anion gap: 10 (ref 5–15)
BUN: 93 mg/dL — ABNORMAL HIGH (ref 8–23)
CO2: 22 mmol/L (ref 22–32)
Calcium: 8.4 mg/dL — ABNORMAL LOW (ref 8.9–10.3)
Chloride: 101 mmol/L (ref 98–111)
Creatinine, Ser: 3.42 mg/dL — ABNORMAL HIGH (ref 0.61–1.24)
GFR, Estimated: 18 mL/min — ABNORMAL LOW (ref >60)
Glucose, Bld: 144 mg/dL — ABNORMAL HIGH (ref 70–99)
Potassium: 3.3 mmol/L — ABNORMAL LOW (ref 3.5–5.1)
Sodium: 133 mmol/L — ABNORMAL LOW (ref 135–145)
Total Bilirubin: 0.9 mg/dL (ref 0.3–1.2)
Total Protein: 5.9 g/dL — ABNORMAL LOW (ref 6.5–8.1)

## 2021-04-13 LAB — MAGNESIUM: Magnesium: 1.7 mg/dL (ref 1.7–2.4)

## 2021-04-13 LAB — CREATININE, URINE, RANDOM: Creatinine, Urine: 64.52 mg/dL

## 2021-04-13 LAB — PHOSPHORUS: Phosphorus: 3.9 mg/dL (ref 2.5–4.6)

## 2021-04-13 LAB — GLUCOSE, CAPILLARY
Glucose-Capillary: 136 mg/dL — ABNORMAL HIGH (ref 70–99)
Glucose-Capillary: 218 mg/dL — ABNORMAL HIGH (ref 70–99)
Glucose-Capillary: 212 mg/dL — ABNORMAL HIGH (ref 70–99)
Glucose-Capillary: 184 mg/dL — ABNORMAL HIGH (ref 70–99)

## 2021-04-13 LAB — SODIUM, URINE, RANDOM: Sodium, Ur: 27 mmol/L

## 2021-04-13 MED ORDER — POTASSIUM CHLORIDE CRYS ER 20 MEQ PO TBCR
40.0000 meq | EXTENDED_RELEASE_TABLET | Freq: Once | ORAL | Status: AC
  Administered 2021-04-13: 40 meq via ORAL
  Filled 2021-04-13: qty 2

## 2021-04-13 MED ORDER — SODIUM CHLORIDE 0.9 % IV SOLN
3.0000 g | Freq: Two times a day (BID) | INTRAVENOUS | Status: DC
  Administered 2021-04-13 – 2021-04-17 (×8): 3 g via INTRAVENOUS
  Filled 2021-04-13 (×3): qty 8
  Filled 2021-04-13: qty 3
  Filled 2021-04-13: qty 8
  Filled 2021-04-13: qty 3
  Filled 2021-04-13 (×2): qty 8
  Filled 2021-04-13: qty 3
  Filled 2021-04-13 (×2): qty 8

## 2021-04-13 MED ORDER — SODIUM CHLORIDE 0.9 % IV SOLN: INTRAVENOUS | Status: AC

## 2021-04-13 NOTE — Progress Notes (Signed)
?PROGRESS NOTE ? ? ? ?Allen Yu  NID:[id number] DOB: [birth year] DOA: 04/11/2021 ?PCP: [name], MD  ? ?Brief Narrative:  ?HPI per Dr. Jenetta Downer on 04/11/21 ? Allen Yu is a 76 y.o. male with medical history significant for dementia, schizophrenia, PTSD, systolic CHF, chronic indwelling Foley catheter, paroxysmal atrial fibrillation, diabetes mellitus, hypertension, left above-knee amputation. ?  ?Patient was in the ED yesterday with reports of hematuria.  Per notes patient's Foley catheter got caught on furniture and gave a tug 2 days prior. Bloody urine was noticed which had cleared up, but his urine remained dark so he was brought to the hospital.  WBC of 17.9.  Creatinine was 2.5.  The ED provider was concerned about a placated UTI with Foley catheter, 1 L fluid bolus and IV Rocephin was given, admission was recommended, but patient and wife declined, patient reported he had been in the hospital so much that he would rather " die at home if he has to".  Antibiotic prescription was given, patient left AGAINST MEDICAL ADVICE.  Foley was changed yesterday in the ED. ?  ?Patient was called to come back to the ED because blood cultures were positive.  On my evaluation, patient has baseline dementia, he answers simple question that he has had a cough, he refuses to answer any further question or cooperate with exam requesting to be left alone and to cover his head with the bed covers. ?  ?Talked to spouse, she reports patient has had a bad cough over the past 4 days.  No difficulty breathing.  Tmax of 99.5.  Patient would complain if he has any pain, she denies abdominal pain, no vomiting, had 1 episode of loose stool today.  He has not eaten in 4 days.  After trauma to his Foley she reports after the catheter days ago, he had bloody urine that cleared the next day.  After the Foley was changed in the ED yesterday, this morning, the sheets were soaked with blood.  At baseline patient's can  communicate and answer questions, he ambulates with a wheelchair. ?  ?ED Course: Tmax 98.  Heart rate 70-100.  Respiratory rate 12-20.  Blood pressure systolic down to 14/43, improved currently 90s to low 100s. ?WBC 20.5.  Creatinine 3.15.  Sodium 128.  Lactic acidosis of 2.2 > 1.9.  Hemoglobin 6.5.  Chest x-ray shows worsening pneumonia.  Renal CT study suggest emphysematous cystitis and possible colitis. ?1 unit PRBC ordered for transfusion.  IV cefepime started.  500 mill Ringer's lactate given. ? ?**Interim History ?Blood cultures have come back as E. coli with sensitivities that are pansensitive.  We have de-escalated his antibiotics from cefepime to ceftriaxone yesterday and will further consolidate his antibiotics to Unasyn and Flagyl.  Foley catheter was changed in the ED.  Patient was little confused and pulled out his IV this morning but is reconnected.  WBC remains but has been improving and trended down from 20.5 is now 14.1. ? ?Unfortunately renal function has not really improved so we will consult nephrology for further evaluation recommendations and continue gentle IV fluid hydration at 50 cc for now.  Nephrology Dr. Royce Macadamia to see the patient in the morning ?   ? ? ?Assessment and Plan: ?* Bacteremia ?-Blood cultures done yesterday 3/20-rapid ID panel positive for E. coli.   ?-Likely source from patient's chronic indwelling Foley catheter.   ?-Urine culture 3/20 - multiple species present, recollection suggested.   ?-He has significant leukocytosis of 20.5  with repeat being 20.3 and has now trended down to 14.1. With lactic acid of 2.2 > 1.9.  Also was hypotensive down to 89 Systolic, with AKI.   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   ?-Patient's urinalysis showed a cloudy appearance with amber-colored urine, moderate hemoglobin, negative ketones, moderate leukocytes, negative nitrites, many bacteria, greater than 50 RBCs per high-power field, greater than 50  WBCs ?-Chest x-ray suggest worsening pneumonia. ?-Follow-up blood culture result and sensitivities as it shows pansensitive E. coli ?-Obtain new urine cultures in the a.m. ?-Continued IV cefepime and changed to IV ceftriaxone and this has been de-escalated to IV Unasyn ?-Prior urine cultures 12/2020 grew Enterobacter cloacae and Enterococcus faecalis, sensitive to cefepime. ?-500 mill bolus given, continue N/s 50cc/hr x 15hrs however have resumed his IV fluids for another 1 day again and 50 cc/h given his history of chronic systolic CHF ?-Follow cultures and adjust antibiotics as necessary and if persist may need ID involvement but will hold off for now ? ?Pneumonia ?-Reports a cough.   ?-He is afebrile, has leukocytosis of 20.5 but is now trending down and went to 14.1.   ?-Chest x-ray suggest worsening pneumonia as it showed "Increased left basilar opacity is noted concerning for worsening Pneumonia." ?-His O2 sats greater than 95% on room air. ?-SpO2: 92 % ?-We will add flutter valve, incentive spirometry and guaifenesin 1200 mg p.o. twice daily ?-C/w IV cefepime and azithromycin but this has been consolidated to IV Unasyn ?-Resume Gentle IVF Hydration with NS at 90m/hr x 1 day given his renal function is still elevated ?-Repeat chest x-ray in the morning ? ?Schizophrenia, unspecified ([place] ?-Has been Agitated recently so we will need to continue to monitor ?-Resumed Risperidone 1 tablet every 12 hours as needed as needed for restlessness and agitation ? ?Emphysematous cystitis ?-Suggested on renal CT stone study.   ?-Blood cultures growing E. Coli that is pansensitive.   ?-Has chronic indwelling Foley catheter- Foley was changed yesterday-3/20 in ED. ?-IV antibiotics for now and consolidate antibiotics to IV Unasyn and Flagyl ?-Resume gentle IVF hydration and will resume for another 1 day ?-May benefit from urology in-put  ? ?Anemia ?-Acute on chronic anemia.   ?-Hemoglobin down to 6.4, recent baseline 7.5-8.9.  Anemia panel obtained in ED prior to transfusion, ferritin elevated 662, iron indicis suggestive of anemia of chronic disease.  Folate and B12 levels normal.  ?-Spouse reports significant blood loss from catheter prior to admission  ?-He is status post transfusion 1 unit PRBC ?-Patient's hemoglobin/hematocrit went from 7.7/24.3 -> 8.1/24.3 -> 7.4/22.4 and could be dilutional drop given that he is getting IV fluid hydration ?-Continue monitor and trend and Monitor for signs and symptoms of bleeding; his gross hematuria looks like it is improved and resolved we will hold off urological consultation ? ?Acute kidney injury superimposed on chronic kidney disease ([place] ?-Creatinine 3.15 and is continue to worsen, baseline 1.3-1.6. Baseline CKD 3A-B.  ?Creatinine has increased to 2.5, from when he was in the ED yesterday.  Barely any oral intake in the past 4 days. ?-Hydrate with 50 MLS per hour for another 1 day ?-Hold losartan, torsemide for now ?-Avoid nephrotoxic medications, contrast dyes, hypotension and renally dose medications ?-Foley catheter has been exchanged. ?-CT Renal Stone study showed "Compared to 07/21/2020, there is again air throughout the urinary bladder wall and also air within the urinary bladder lumen. 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 Air is no longer visualized within  the urethra, anterior peritoneal, and lateral pelvic sidewalls. Recommend clinical correlation. Resolution of the prior mild bilateral hydronephrosis. There is again mild-to-moderate circumferential wall thickening of the rectum. New decompression and mild circumferential wall thickening of the entire sigmoid colon in the majority of the descending colon. This is suggestive of colitis. New elevation of the left hemidiaphragm. This may correspond to the volume loss and opacity concerning for pneumonia on recent radiographs as well as 01/23/2021 radiograph.  There is moderate cardiomegaly new compared to 07/21/2020  CT. This change is also seen on 04/11/2021 versus 07/21/2020 radiographs." ?-Given that his renal function continues to be elevated have consulted nephrology ? ?Chronic systolic CHF (congestive heart failure) (H

## 2021-04-13 NOTE — TOC Initial Note (Addendum)
Transition of Care (TOC) - Initial/Assessment Note  ? ? ?Patient Details  ?Name: Allen Yu ?MRN: [id number] ?Date of Birth: Jul 30, 1945 ? ?Transition of Care (TOC) CM/SW Contact:    ?Estalene Bergey D, LCSW ?Phone Number: ?04/13/2021, 5:08 PM ? ?Clinical Narrative:                 ?Patient from home with wife and two teenage daughters. Patient has a caregiver for 36 hours a week and can get additional hours if needed through the [place]. Pt has left [facility].  ?[place] emergency notification completed.  ?Your notification ID is: ?[id number] ? ?Expected Discharge Plan: [place] ?Barriers to Discharge: Hospice Bed not available ? ? ?Patient Goals and CMS Choice ?  ?  ?  ? ?Expected Discharge Plan and Services ?Expected Discharge Plan: [place] ?  ?  ?  ?Living arrangements for the past 2 months: [place] ?                ?  ?  ?  ?  ?  ?  ?  ?  ?  ?  ? ?Prior Living Arrangements/Services ?Living arrangements for the past 2 months: [place] ?Lives with:: Spouse ?Patient language and need for interpreter reviewed:: Yes ?       ?Need for Family Participation in Patient Care: Yes (Comment) ?Care giver support system in place?: Yes (comment) ?Current home services: DME, Other (comment) (roll in shower, lift chair, platform elevator, [facility]) ?Criminal Activity/Legal Involvement Pertinent to Current Situation/Hospitalization: No - Comment as needed ? ?Activities of Daily Living ?Home Assistive Devices/Equipment: Wheelchair, CBG Meter, Cane (specify quad or straight), Hearing aid (roll in shower.) ?ADL Screening (condition at time of admission) ?Patient's cognitive ability adequate to safely complete daily activities?: No ?Is the patient deaf or have difficulty hearing?: No ?Does the patient have difficulty seeing, even when wearing glasses/contacts?: Yes ?Does the patient have difficulty concentrating, remembering, or making decisions?: Yes ?Patient able to express need  for assistance with ADLs?: No ?Does the patient have difficulty dressing or bathing?: Yes ?Independently performs ADLs?: No ?Communication: Independent ?Dressing (OT): Needs assistance ?Is this a change from baseline?: Pre-admission baseline ?Grooming: Needs assistance ?Is this a change from baseline?: Pre-admission baseline ?Feeding: Independent ?Bathing: Needs assistance ?Is this a change from baseline?: Pre-admission baseline ?Toileting: Needs assistance ?Is this a change from baseline?: Pre-admission baseline ?In/Out Bed: Needs assistance ?Walks in Home: Needs assistance ?Is this a change from baseline?: Pre-admission baseline ?Does the patient have difficulty walking or climbing stairs?: Yes ?Weakness of Legs: Both ?Weakness of Arms/Hands: None ? ?Permission Sought/Granted ?  ?  ?   ?   ?   ?   ? ?Emotional Assessment ?  ?  ?  ?  ?  ?  ? ?Admission diagnosis:  Bacteremia [R78.81] ?Severe sepsis ([place]) [A41.9, R65.20] ?Anemia, unspecified type [D64.9] ?Patient Active Problem List  ? Diagnosis Date Noted  ? Hypokalemia 04/13/2021  ? Colitis 04/12/2021  ? Bacteremia 04/11/2021  ? Pneumonia 04/11/2021  ? Chronic systolic CHF (congestive heart failure) ([place]) 04/11/2021  ? Acute respiratory failure with hypoxia and hypercarbia ([place]) 01/23/2021  ? CKD (chronic kidney disease) stage 3, GFR 30-59 ml/min (HCC) 01/23/2021  ? Acute on chronic systolic CHF (congestive heart failure) ([place]) 01/23/2021  ? Acute respiratory failure with hypoxia ([place]) 01/13/2021  ? Encounter for fitting and adjustment of hearing aid 12/21/2020  ? Other specified problems related to psychosocial  circumstances 11/21/2020  ? Sensorineural hearing loss, bilateral 11/21/2020  ? Tobacco dependence 10/06/2020  ? Benign prostatic hyperplasia with urinary obstruction 10/06/2020  ? Above knee amputation of left lower extremity ([place]) 10/06/2020  ? Other specified counseling 10/06/2020  ? Severe sepsis ([place]) 09/24/2020  ? Schizophrenia, unspecified ([place])  09/24/2020  ? Unspecified dementia, unspecified severity, without behavioral disturbance, psychotic disturbance, mood disturbance, and anxiety ([place]) 09/24/2020  ? Lung nodule 09/24/2020  ? Indwelling Foley catheter present 09/24/2020  ? Peripheral vascular disease ([place]) 09/24/2020  ? Mass of testicle 09/24/2020  ? Problem related to unspecified psychosocial circumstances 08/01/2020  ? Hypermetropia 07/22/2020  ? Unspecified atrial fibrillation ([place]) 07/22/2020  ? Klebsiella sepsis ([place]) 07/22/2020  ? 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 SIRS (systemic inflammatory response syndrome) ([place]) 07/21/2020  ? Acute metabolic encephalopathy 75/44/9201  ? Hyponatremia   ? S/P AKA (above knee amputation) unilateral, left ([place]) 06/28/2020  ? Gangrene of left foot ([place]) 06/28/2020  ? 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 ([place]) 05/27/2020  ? Critical lower limb ischemia ([place]) 05/21/2020  ? Malnutrition ([place]) 05/19/2020  ? Plantar callus 12/24/2019  ? Cavitary pneumonia 09/14/2019  ? 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  ? Unintentional weight loss 09/14/2019  ? Coronary artery calcification seen on CT scan 10/28/2018  ? Hilar adenopathy 04/10/2016  ? Lung nodule seen on imaging study 07/09/2015  ? Acute encephalopathy   ? Hypoglycemia due to insulin   ? Type 2 diabetes mellitus with other circulatory complications ([place]) 00/71/2197  ? Acute kidney injury superimposed on chronic kidney disease ([place]) 07/08/2015  ? Tobacco use disorder 07/08/2015  ? Diverticulosis of colon without hemorrhage   ? Left inguinal hernia 09/16/2013  ? Altered mental status 11/17/2012  ? Hypothermia 11/17/2012  ? Leukocytosis 11/17/2012  ? Chronic kidney disease, stage 3 unspecified ([place])   ? Post-traumatic stress disorder, chronic   ? GERD (gastroesophageal reflux disease)   ? Essential (primary) hypertension 09/10/2011  ? Anemia 09/10/2011  ? Diabetic neuropathy ([place]) 09/10/2011  ? DDD (degenerative disc disease), lumbar  09/10/2011  ? Psychosis ([place]) 09/10/2011  ? Diabetes mellitus type 2, uncontrolled 09/10/2011  ? Hyperlipemia 09/10/2011  ? Microalbuminuria 09/10/2011  ? ?PCP:  [name], MD ?Pharmacy:   ?[facility] - [address] [place] ?[place] ?[place] [address] ?Phone: [phone] Fax: [phone] ? ?[id number] - , [address] ?[place] ?[place] [address] ?Phone: [phone] Fax: [phone] ? ?[place], [place], [address] ?[phone] ?[place] [address] ?Phone: [phone] Fax: [phone] ? ? ? ? ?Social Determinants of Health (SDOH) Interventions ?  ? ?Readmission Risk Interventions ? ?  01/25/2021  ?  9:26 AM 01/18/2021  ?  2:24 PM 09/26/2020  ? 10:44 AM  ?Readmission Risk Prevention Plan  ?Transportation Screening Complete Complete Complete  ?Medication Review Press photographer) Complete Complete Complete  ?PCP or Specialist appointment within 3-5 days of discharge  Complete   ?[place] or Home Care Consult Complete Complete Complete  ?SW Recovery Care/Counseling Consult Complete  Complete  ?Palliative Care Screening Not Applicable  Not Complete  ?Comments   Referred to [facility] Palliative in July  ?[place] Not Applicable Patient Refused Not Complete  ?SNF Comments   Refusing  ? ? ? ?

## 2021-04-13 NOTE — Assessment & Plan Note (Addendum)
-  Patient's potassium is now 3.7 ?-Mag level was 1.7 on last check ?-Continue to monitor and replete as necessary ?-Repeat CMP in a.m. ?

## 2021-04-13 NOTE — Plan of Care (Signed)
?  Problem: Activity: ?Goal: Ability to tolerate increased activity will improve ?Outcome: Progressing ?  ?Problem: Clinical Measurements: ?Goal: Respiratory complications will improve ?Outcome: Progressing ?  ?Problem: Nutrition: ?Goal: Adequate nutrition will be maintained ?Outcome: Progressing ?  ?Problem: Coping: ?Goal: Level of anxiety will decrease ?Outcome: Progressing ?  ?Problem: Pain Managment: ?Goal: General experience of comfort will improve ?Outcome: Progressing ?  ?Problem: Safety: ?Goal: Ability to remain free from injury will improve ?Outcome: Progressing ?  ?

## 2021-04-13 NOTE — Assessment & Plan Note (Addendum)
-   Has been intermittent and sodium has gone from 137 -> 128 -> 135 -> 133 -> 135 -> 134 -> 136 -> 137 ?-IVF Now stopped ?-Continue monitor trend and repeat CMP in the a.m. ?

## 2021-04-14 DIAGNOSIS — R7881 Bacteremia: Secondary | ICD-10-CM | POA: Diagnosis not present

## 2021-04-14 DIAGNOSIS — D649 Anemia, unspecified: Secondary | ICD-10-CM | POA: Diagnosis not present

## 2021-04-14 DIAGNOSIS — N179 Acute kidney failure, unspecified: Secondary | ICD-10-CM | POA: Diagnosis not present

## 2021-04-14 DIAGNOSIS — I5022 Chronic systolic (congestive) heart failure: Secondary | ICD-10-CM | POA: Diagnosis not present

## 2021-04-14 LAB — COMPREHENSIVE METABOLIC PANEL
ALT: 19 U/L (ref 0–44)
AST: 25 U/L (ref 15–41)
Albumin: 2.8 g/dL — ABNORMAL LOW (ref 3.5–5.0)
Alkaline Phosphatase: 117 U/L (ref 38–126)
Anion gap: 10 (ref 5–15)
BUN: 75 mg/dL — ABNORMAL HIGH (ref 8–23)
CO2: 23 mmol/L (ref 22–32)
Calcium: 8.7 mg/dL — ABNORMAL LOW (ref 8.9–10.3)
Chloride: 102 mmol/L (ref 98–111)
Creatinine, Ser: 2.96 mg/dL — ABNORMAL HIGH (ref 0.61–1.24)
GFR, Estimated: 21 mL/min — ABNORMAL LOW (ref >60)
Glucose, Bld: 202 mg/dL — ABNORMAL HIGH (ref 70–99)
Potassium: 3.7 mmol/L (ref 3.5–5.1)
Sodium: 135 mmol/L (ref 135–145)
Total Bilirubin: 0.7 mg/dL (ref 0.3–1.2)
Total Protein: 6.3 g/dL — ABNORMAL LOW (ref 6.5–8.1)

## 2021-04-14 LAB — GLUCOSE, CAPILLARY
Glucose-Capillary: 263 mg/dL — ABNORMAL HIGH (ref 70–99)
Glucose-Capillary: 219 mg/dL — ABNORMAL HIGH (ref 70–99)
Glucose-Capillary: 247 mg/dL — ABNORMAL HIGH (ref 70–99)

## 2021-04-14 LAB — RENAL FUNCTION PANEL
Albumin: 2.9 g/dL — ABNORMAL LOW (ref 3.5–5.0)
Anion gap: 11 (ref 5–15)
BUN: 77 mg/dL — ABNORMAL HIGH (ref 8–23)
CO2: 22 mmol/L (ref 22–32)
Calcium: 8.5 mg/dL — ABNORMAL LOW (ref 8.9–10.3)
Chloride: 101 mmol/L (ref 98–111)
Creatinine, Ser: 2.95 mg/dL — ABNORMAL HIGH (ref 0.61–1.24)
GFR, Estimated: 21 mL/min — ABNORMAL LOW (ref >60)
Glucose, Bld: 202 mg/dL — ABNORMAL HIGH (ref 70–99)
Phosphorus: 3.4 mg/dL (ref 2.5–4.6)
Potassium: 3.8 mmol/L (ref 3.5–5.1)
Sodium: 134 mmol/L — ABNORMAL LOW (ref 135–145)

## 2021-04-14 LAB — CBC WITH DIFFERENTIAL/PLATELET
Abs Immature Granulocytes: 0.16 10*3/uL — ABNORMAL HIGH (ref 0.00–0.07)
Basophils Absolute: 0 10*3/uL (ref 0.0–0.1)
Basophils Relative: 0 %
Eosinophils Absolute: 0.2 10*3/uL (ref 0.0–0.5)
Eosinophils Relative: 2 %
HCT: 24.3 % — ABNORMAL LOW (ref 39.0–52.0)
Hemoglobin: 7.9 g/dL — ABNORMAL LOW (ref 13.0–17.0)
Immature Granulocytes: 1 %
Lymphocytes Relative: 10 %
Lymphs Abs: 1.1 10*3/uL (ref 0.7–4.0)
MCH: 31.7 pg (ref 26.0–34.0)
MCHC: 32.5 g/dL (ref 30.0–36.0)
MCV: 97.6 fL (ref 80.0–100.0)
Monocytes Absolute: 1.2 10*3/uL — ABNORMAL HIGH (ref 0.1–1.0)
Monocytes Relative: 11 %
Neutro Abs: 8.4 10*3/uL — ABNORMAL HIGH (ref 1.7–7.7)
Neutrophils Relative %: 76 %
Platelets: 313 10*3/uL (ref 150–400)
RBC: 2.49 MIL/uL — ABNORMAL LOW (ref 4.22–5.81)
RDW: 14.1 % (ref 11.5–15.5)
WBC: 11.1 10*3/uL — ABNORMAL HIGH (ref 4.0–10.5)
nRBC: 0 % (ref 0.0–0.2)

## 2021-04-14 LAB — MAGNESIUM: Magnesium: 1.8 mg/dL (ref 1.7–2.4)

## 2021-04-14 LAB — PHOSPHORUS: Phosphorus: 3.3 mg/dL (ref 2.5–4.6)

## 2021-04-14 LAB — OSMOLALITY, URINE: Osmolality, Ur: 349 mOsm/kg (ref 300–900)

## 2021-04-14 MED ORDER — POTASSIUM CHLORIDE CRYS ER 20 MEQ PO TBCR
40.0000 meq | EXTENDED_RELEASE_TABLET | Freq: Once | ORAL | Status: AC
  Administered 2021-04-14: 40 meq via ORAL
  Filled 2021-04-14: qty 2

## 2021-04-14 MED ORDER — SODIUM CHLORIDE 0.9 % IV SOLN: INTRAVENOUS | Status: DC

## 2021-04-14 NOTE — Progress Notes (Addendum)
When I arrived for my shift at 1900, patient was very agitated. He told me to tell the doctor that he was going to go home tomorrow and told me to "get the fuck out of his room or I'd be in trouble" and raised his hand to hit me. I went back in around 2100 to give him his medicine and he basically told me the same thing and covered his face with the blanket. The NT went into his room to take his vital signs and he refused and told her to "get the fuck out of his room and to let him sleep". Around 2130 NT went into the room again and the patient said that he needed pain medication. I got him some Tylenol and we both went into his room together. He only took he medication because I told him it was his pain medication. When I went to hang his IV antibiotic I noticed that both of his IVs were out and he would not allow me to insert another one. I attempted to talk with him about the importance of receiving his antibiotics through his IV. He raised his fist to me several times and told me to "get the fuck out of his room and that he was not getting a new IV" and then put the covers over his head again. He said that he was going home tomorrow and that I needed to let the doctor know that. After about 30 minutes of trying to talk the patient into letting me insert a new IV I was unsuccessful and left the room. ?He would not allow me to assess him either. ? ?Update: I notified the charge RN and [facility] about the matter. The [facility] notified Dr. Josephine Cables. ?

## 2021-04-14 NOTE — Progress Notes (Signed)
Attempted to place IV again and patient told me to "get the hell out of his room" ?

## 2021-04-14 NOTE — Plan of Care (Signed)
  Problem: Health Behavior/Discharge Planning: Goal: Ability to manage health-related needs will improve Outcome: Progressing   Problem: Clinical Measurements: Goal: Ability to maintain clinical measurements within normal limits will improve Outcome: Progressing   

## 2021-04-14 NOTE — Progress Notes (Addendum)
?PROGRESS NOTE ? ? ? ?Allen Yu  QQP:[id number] DOB: [birth year] DOA: 04/11/2021 ?PCP: [name], MD  ? ?Brief Narrative:  ?HPI per Dr. Jenetta Downer on 04/11/21 ? Allen Yu is a 76 y.o. male with medical history significant for dementia, schizophrenia, PTSD, systolic CHF, chronic indwelling Foley catheter, paroxysmal atrial fibrillation, diabetes mellitus, hypertension, left above-knee amputation. ?  ?Patient was in the ED yesterday with reports of hematuria.  Per notes patient's Foley catheter got caught on furniture and gave a tug 2 days prior. Bloody urine was noticed which had cleared up, but his urine remained dark so he was brought to the hospital.  WBC of 17.9.  Creatinine was 2.5.  The ED provider was concerned about a placated UTI with Foley catheter, 1 L fluid bolus and IV Rocephin was given, admission was recommended, but patient and wife declined, patient reported he had been in the hospital so much that he would rather " die at home if he has to".  Antibiotic prescription was given, patient left AGAINST MEDICAL ADVICE.  Foley was changed yesterday in the ED. ?  ?Patient was called to come back to the ED because blood cultures were positive.  On my evaluation, patient has baseline dementia, he answers simple question that he has had a cough, he refuses to answer any further question or cooperate with exam requesting to be left alone and to cover his head with the bed covers. ?  ?Talked to spouse, she reports patient has had a bad cough over the past 4 days.  No difficulty breathing.  Tmax of 99.5.  Patient would complain if he has any pain, she denies abdominal pain, no vomiting, had 1 episode of loose stool today.  He has not eaten in 4 days.  After trauma to his Foley she reports after the catheter days ago, he had bloody urine that cleared the next day.  After the Foley was changed in the ED yesterday, this morning, the sheets were soaked with blood.  At baseline patient's can  communicate and answer questions, he ambulates with a wheelchair. ?  ?ED Course: Tmax 98.  Heart rate 70-100.  Respiratory rate 12-20.  Blood pressure systolic down to 71/24, improved currently 90s to low 100s. ?WBC 20.5.  Creatinine 3.15.  Sodium 128.  Lactic acidosis of 2.2 > 1.9.  Hemoglobin 6.5.  Chest x-ray shows worsening pneumonia.  Renal CT study suggest emphysematous cystitis and possible colitis. ?1 unit PRBC ordered for transfusion.  IV cefepime started.  500 mill Ringer's lactate given. ? ?**Interim History ?Blood cultures have come back as E. coli with sensitivities that are pansensitive.  We have de-escalated his antibiotics from cefepime to ceftriaxone yesterday and will further consolidate his antibiotics to Unasyn and Flagyl.  Foley catheter was changed in the ED.  Patient was little confused and pulled out his IV this morning but is reconnected.  WBC remains but has been improving and trended down from 20.5 is now 14.1. ? ?Unfortunately renal function has not really improved so we will consult nephrology for further evaluation recommendations and continue gentle IV fluid hydration at 50 cc for now.  Nephrology Dr. Royce Macadamia to see the patient in the morning ? ?04/14/21: Overnight he pulled out his IV and was very agitated.  Refuses blood work this morning however is now improving after he finally agreed.  Nephrology feels that his AKI is in the setting of his bacteremia and Foley trauma as well as recently given Bactrim.  They  are recommending holding his ARB and his Foley has been changed.  They are recommending also continue to hold his torsemide and hold his diuretics.  He is not a candidate for dialysis should his renal function continue to worsen.  He was given low-dose IV fluids and will continue for now.  BUN/creatinine is slightly improved from yesterday.  We will need to continue monitor and continue to treat his E. coli bacteremia and UTI. ?   ? ? ?Assessment and Plan: ?* Bacteremia ?-Blood  cultures done yesterday 3/20-rapid ID panel positive for E. coli.   ?-Likely source from patient's chronic indwelling Foley catheter.   ?-Urine culture 3/20 - multiple species present, recollection suggested and was sent and now showing >100,000 CFU of Enterococcus Faecalis ?-He has significant leukocytosis of 20.5 with repeat being 20.3 and has now trended down to 14.1. With lactic acid of 2.2 > 1.9.  Also was hypotensive down to 89 Systolic, with AKI.   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   ?-Patient's urinalysis showed a cloudy appearance with amber-colored urine, moderate hemoglobin, negative ketones, moderate leukocytes, negative nitrites, many bacteria, greater than 50 RBCs per high-power field, greater than 50 WBCs ?-Chest x-ray suggest worsening pneumonia. ?-Follow-up blood culture result and sensitivities as it shows pansensitive E. coli ?-New Urine Cx showing >100,000 CFU of Enterococcus Faecalis with sensitivites pending but ? Colonization as WBC is improving  ?-Continued IV cefepime and changed to IV ceftriaxone and this has been de-escalated to IV Unasyn and he is on Flagyl for the Colitis  ?-Prior urine cultures 12/2020 grew Enterobacter cloacae and Enterococcus faecalis, sensitive to cefepime. ?-500 mill bolus given, continue N/s 50cc/hr x 15hrs however have resumed his IV fluids for another 1 day again and 50 cc/h given his history of chronic systolic CHF and because his IV was removed overnight  ?-Follow cultures and adjust antibiotics as necessary and will discuss case with ID in the AM  ? ?Pneumonia ?-Reports a cough.   ?-He is afebrile, has leukocytosis of 20.5 but is now trending down and went to 14.1 -> 11.1.   ?-Chest x-ray suggest worsening pneumonia as it showed "Increased left basilar opacity is noted concerning for worsening Pneumonia." ?-His O2 sats greater than 95% on room air. ?-SpO2: 92 % ?-We will add flutter valve, incentive spirometry and  guaifenesin 1200 mg p.o. twice daily ?-IV cefepime and azithromycin but this has been consolidated to IV Unasyn ?-Resume Gentle IVF Hydration with NS at 5m/hr x 1 day given his renal function is still elevated and will continue  ?-Repeat chest x-ray in the morning ? ?Schizophrenia, unspecified ([place] ?-Has been Agitated recently so we will need to continue to monitor ?-Resumed Risperidone 1 tablet every 12 hours as needed as needed for restlessness and agitation ?-May need Haldol and psych consult VEther Griffinshas been agitated ? ?Emphysematous cystitis ?-Suggested on renal CT stone study.   ?-Blood cultures growing E. Coli that is pansensitive.   ?-Has chronic indwelling Foley catheter- Foley was changed yesterday-3/20 in ED. ?-IV antibiotics for now and consolidate antibiotics to IV Unasyn and Flagyl ?-Initial Urine Cx showed Multiple species present but repeat shows >100,000 CFU of Enterococcus Faecalis and is awaiting Sensitivities  ?-Resume gentle IVF hydration and will resume for another 1 day ?-May benefit from urology in-put  ? ?Anemia ?-Acute on chronic anemia.   ?-Hemoglobin down to 6.4, recent baseline 7.5-8.9. Anemia panel obtained in ED prior to transfusion, ferritin elevated 662, iron  indicis suggestive of anemia of chronic disease.  Folate and B12 levels normal.  ?-Spouse reports significant blood loss from catheter prior to admission  ?-He is status post transfusion 1 unit PRBC ?-Patient's hemoglobin/hematocrit went from 7.7/24.3 -> 8.1/24.3 -> 7.4/22.4 and could have been dilutional drop given that he is getting IV fluid hydration but it is now 7.9/24.3 ?-Continue monitor and trend and Monitor for signs and symptoms of bleeding; his gross hematuria looks like it is improved and resolved we will hold off urological consultation at this time  ? ?Acute kidney injury superimposed on chronic kidney disease ([place]) ?-Creatinine 3.15 and is continue to worsen, baseline 1.3-1.6. Baseline CKD 3A-B.  ?Creatinine  has increased to 2.5, from when he was in the ED yesterday.  Barely any oral intake in the past 4 days. ?-Hydrate with 50 MLS per hour for another 1 day ?-Hold losartan, torsemide for now ?-Avoid nephrotoxic medic

## 2021-04-14 NOTE — Care Management Important Message (Signed)
Important Message ? ?Patient Details  ?Name: Allen Yu ?MRN: [id number] ?Date of Birth: [birth year] ? ? ?Medicare Important Message Given:  Yes ? ? ? ? ?[name] ?04/14/2021, 4:13 PM ?

## 2021-04-14 NOTE — Progress Notes (Signed)
Patient very agitated this am, very hostile. Refusing everything CBG checks,assessment. Told me "I want to rest ,". Patient has no IV access at this time. Dr Alfredia Ferguson notified. Will continue to monitor patient. ?

## 2021-04-14 NOTE — Consult Note (Signed)
Allen Yu ?Renal Consultation Note ? ?Requesting MD: Raiford Noble, DO ?Indication for Consultation:  AKI  ? ?Chief complaint: blood in the urine ? ?HPI:   ?Allen Yu is a 76 y.o. male with a history of chronic heart failure with reduced EF, CKD, dementia, diabetes mellitus, hypertension, peripheral A-fib, chronic indwelling foley, PTSD, and schizophrenia who presented to the hospital with blood in his urine.  Per charting he had gotten  his catheter caught in the furniture a couple of days prior.  Foley was changed in the ER and he was found to have UTI.  He left the ER against medical advice, stating per charting that he would "rather die at home if he has to."  Per charting he was given antibiotics but was later called back to the ER because his blood cultures were positive.  He was noted to have E coli and enterobacterales bacteremia.  He has requested to be left alone.  He has no labs available today for assessment.  Urology was consulted. CT demonstrated no hydronephrosis.  Primary team has hydrated gently however pt without IV access today.  They have consulted nephrology for assistance with management of AKI.  Home meds apparently included losartan and torsemide and he was given bactrim for the UTI per charting.   I spoke with his wife via phone to update - she states he "hates being in the hospital" and that at home he's usually pleasant and cooperative with her.   Pt agrees to get labs.  ? ?Creat  ?Date/Time Value Ref Range Status  ?11/26/2019 09:34 AM 1.10 0.70 - 1.18 mg/dL Final  ?  Comment:  ?  For patients >50 years of age, the reference limit ?for Creatinine is approximately 13% higher for people ?identified as African-American. ?. ?  ?09/14/2019 02:23 PM 1.40 (H) 0.70 - 1.18 mg/dL Final  ?  Comment:  ?  For patients >58 years of age, the reference limit ?for Creatinine is approximately 13% higher for people ?identified as African-American. ?. ?  ? ?Creatinine, Ser  ?Date/Time  Value Ref Range Status  ?04/13/2021 06:01 AM 3.42 (H) 0.61 - 1.24 mg/dL Final  ?04/12/2021 05:30 AM 3.44 (H) 0.61 - 1.24 mg/dL Final  ?04/11/2021 01:11 PM 3.15 (H) 0.61 - 1.24 mg/dL Final  ?04/10/2021 11:26 AM 2.56 (H) 0.61 - 1.24 mg/dL Final  ?01/25/2021 05:34 AM 1.39 (H) 0.61 - 1.24 mg/dL Final  ?01/24/2021 04:12 AM 1.32 (H) 0.61 - 1.24 mg/dL Final  ?01/23/2021 09:44 AM 1.60 (H) 0.61 - 1.24 mg/dL Final  ?01/19/2021 05:31 AM 1.96 (H) 0.61 - 1.24 mg/dL Final  ?01/18/2021 04:32 AM 1.79 (H) 0.61 - 1.24 mg/dL Final  ?01/17/2021 04:09 AM 1.82 (H) 0.61 - 1.24 mg/dL Final  ?01/16/2021 04:23 AM 2.00 (H) 0.61 - 1.24 mg/dL Final  ?01/15/2021 01:11 PM 1.94 (H) 0.61 - 1.24 mg/dL Final  ?01/15/2021 05:19 AM 2.11 (H) 0.61 - 1.24 mg/dL Final  ?01/14/2021 09:09 AM 2.24 (H) 0.61 - 1.24 mg/dL Final  ?01/14/2021 04:47 AM 2.18 (H) 0.61 - 1.24 mg/dL Final  ?01/14/2021 01:07 AM 2.24 (H) 0.61 - 1.24 mg/dL Final  ?01/13/2021 07:05 PM 2.21 (H) 0.61 - 1.24 mg/dL Final  ?10/27/2020 09:24 AM 1.10 0.61 - 1.24 mg/dL Final  ?10/07/2020 08:20 AM 1.00 0.61 - 1.24 mg/dL Final  ?09/29/2020 05:36 AM 1.22 0.61 - 1.24 mg/dL Final  ?09/28/2020 04:51 AM 1.15 0.61 - 1.24 mg/dL Final  ?09/27/2020 04:28 AM 1.07 0.61 - 1.24 mg/dL Final  ?09/26/2020 04:08 AM  0.93 0.61 - 1.24 mg/dL Final  ?09/25/2020 01:41 PM 0.90 0.61 - 1.24 mg/dL Final  ?09/25/2020 01:57 AM 0.98 0.61 - 1.24 mg/dL Final  ?09/24/2020 06:40 AM 1.09 0.61 - 1.24 mg/dL Final  ?08/15/2020 01:07 PM 1.04 0.61 - 1.24 mg/dL Final  ?08/02/2020 11:52 AM 1.15 0.61 - 1.24 mg/dL Final  ?07/26/2020 06:32 AM 1.57 (H) 0.61 - 1.24 mg/dL Final  ?07/25/2020 06:35 AM 1.70 (H) 0.61 - 1.24 mg/dL Final  ?07/24/2020 03:28 AM 1.85 (H) 0.61 - 1.24 mg/dL Final  ?07/22/2020 04:38 AM 2.54 (H) 0.61 - 1.24 mg/dL Final  ?07/21/2020 10:55 AM 2.37 (H) 0.61 - 1.24 mg/dL Final  ?06/29/2020 05:09 AM 0.97 0.61 - 1.24 mg/dL Final  ?06/28/2020 05:54 AM 1.37 (H) 0.61 - 1.24 mg/dL Final  ?05/29/2020 12:44 AM 1.05 0.61 - 1.24 mg/dL  Final  ?05/27/2020 01:24 PM 1.17 0.61 - 1.24 mg/dL Final  ?05/27/2020 11:25 AM 1.00 0.61 - 1.24 mg/dL Final  ?05/27/2020 09:52 AM 1.10 0.61 - 1.24 mg/dL Final  ?05/27/2020 08:40 AM 1.20 0.61 - 1.24 mg/dL Final  ?05/25/2020 08:39 AM 1.32 (H) 0.61 - 1.24 mg/dL Final  ?05/21/2020 03:24 AM 1.28 (H) 0.61 - 1.24 mg/dL Final  ?05/20/2020 05:48 AM 1.26 (H) 0.61 - 1.24 mg/dL Final  ?05/19/2020 02:36 AM 1.31 (H) 0.61 - 1.24 mg/dL Final  ?05/18/2020 08:45 AM 1.19 0.61 - 1.24 mg/dL Final  ?05/17/2020 08:42 AM 1.26 (H) 0.61 - 1.24 mg/dL Final  ?07/10/2019 07:02 AM 1.28 (H) 0.61 - 1.24 mg/dL Final  ?07/09/2016 07:36 AM 1.99 (H) 0.61 - 1.24 mg/dL Final  ?03/30/2016 12:44 PM 1.20 0.61 - 1.24 mg/dL Final  ?10/13/2015 10:43 AM 1.22 0.61 - 1.24 mg/dL Final  ?07/10/2015 06:15 AM 1.05 0.61 - 1.24 mg/dL Final  ?07/09/2015 07:57 AM 1.23 0.61 - 1.24 mg/dL Final  ? ? ? ?PMHx: ?  ?Past Medical History:  ?Diagnosis Date  ? Anemia   ? Arthritis   ? Carotid stenosis   ? Chronic back pain   ? Chronic HFrEF (heart failure with reduced ejection fraction) ([place])   ? a. 09/2020 Echo: EF 45%, mild LVH; b. 12/2020 Echo: EF 25-30%, glob HK, mod LVH, mildly reduced RV fxn, RVSP 65.62mHg, mod BAE. Triv effusion. Mod MR/TR.  ? CKD (chronic kidney disease), stage III ([place]   ? Constipation   ? Dementia ([place]   ? Diabetes mellitus   ? Dilated cardiomyopathy ([place]   ? a. 09/2020 Echo: EF 45%; b. 12/2020 Echo: EF 25-30%.  ? GERD (gastroesophageal reflux disease)   ? History of kidney stones   ? Hypertension   ? Lung nodule   ? PAF (paroxysmal atrial fibrillation) ([place]   ? Peripheral neuropathy   ? Peripheral vascular disease ([place]   ? a. 06/2020 s/p L AKA; b. 10/2020 s/p R SFA and above-knee popliteal PTA.  ? PTSD (post-traumatic stress disorder)   ? Schizophrenia ([place]   ? Tuberculosis   ? Treated  ? ? ?Past Surgical History:  ?Procedure Laterality Date  ? ABDOMINAL AORTOGRAM W/LOWER EXTREMITY N/A 05/20/2020  ? Procedure: ABDOMINAL AORTOGRAM W/LOWER EXTREMITY;   Surgeon: FElam Dutch MD;  [facility];  Service: Cardiovascular;  Laterality: N/A;  ? ABDOMINAL AORTOGRAM W/LOWER EXTREMITY N/A 10/27/2020  ? Procedure: ABDOMINAL AORTOGRAM W/LOWER EXTREMITY;  Surgeon: CMarty Heck MD;  [facility];  Service: Cardiovascular;  Laterality: N/A;  ? AMPUTATION Left 06/28/2020  ? Procedure: AMPUTATION ABOVE KNEE LEFT;  Surgeon: ERosetta Posner MD;  [facility]: [facility];  Service: Vascular;  Laterality: Left;  ? APPENDECTOMY    ? BACK SURGERY  2000, 2013  ? x2  ? BLADDER SURGERY    ? 02  ? CHOLECYSTECTOMY    ? COLONOSCOPY    ? COLONOSCOPY N/A 12/07/2014  ? Procedure: COLONOSCOPY;  Surgeon: Daneil Dolin, MD;  [facility];  Service: Endoscopy;  Laterality: N/A;  10:30 Am  ? EYE SURGERY Bilateral   ? removed metal from eye  ? FEMORAL-TIBIAL BYPASS GRAFT Left 05/27/2020  ? Procedure: LEFT FEMORAL TO PERONEAL ARTERY BYPASS;  Surgeon: Rosetta Posner, MD;  Location: [facility];  Service: Vascular;  Laterality: Left;  ? HERNIA REPAIR Right   ? INGUINAL HERNIA REPAIR Left 11/03/2013  ? Procedure: HERNIA REPAIR INGUINAL ADULT;  Surgeon: Gayland Curry, MD;  Location: [facility];  Service: General;  Laterality: Left;  ? PERIPHERAL VASCULAR INTERVENTION  10/27/2020  ? Procedure: PERIPHERAL VASCULAR INTERVENTION;  Surgeon: Marty Heck, MD;  Location: [facility];  Service: Cardiovascular;;  ? SHOULDER SURGERY    ? RIGHT SHOULDER   ? VIDEO BRONCHOSCOPY WITH ENDOBRONCHIAL NAVIGATION N/A 07/10/2019  ? Procedure: VIDEO BRONCHOSCOPY WITH ENDOBRONCHIAL NAVIGATION;  Surgeon: Melrose Nakayama, MD;  Location: [facility];  Service: Thoracic;  Laterality: N/A;  ? ? ?Family Hx:  ?Family History  ?Problem Relation Age of Onset  ? Heart disease Mother   ?     before age 84  ? ? ?Social History:  reports that he has been smoking cigarettes. He has been smoking an average of .5 packs per day. He has never used smokeless tobacco. He reports current drug use. Drug:  Marijuana. He reports that he does not drink alcohol. ? ?Allergies:  ?Allergies  ?Allergen Reactions  ? Bee Venom Anaphylaxis  ? Codeine Other (See Comments)  ?  incoherent  ?Other reaction(s): Delirium  ?

## 2021-04-15 DIAGNOSIS — I5022 Chronic systolic (congestive) heart failure: Secondary | ICD-10-CM | POA: Diagnosis not present

## 2021-04-15 DIAGNOSIS — D649 Anemia, unspecified: Secondary | ICD-10-CM | POA: Diagnosis not present

## 2021-04-15 DIAGNOSIS — R7881 Bacteremia: Secondary | ICD-10-CM | POA: Diagnosis not present

## 2021-04-15 DIAGNOSIS — N179 Acute kidney failure, unspecified: Secondary | ICD-10-CM | POA: Diagnosis not present

## 2021-04-15 LAB — PREPARE RBC (CROSSMATCH)

## 2021-04-15 LAB — CULTURE, BLOOD (ROUTINE X 2): Special Requests: ADEQUATE

## 2021-04-15 LAB — CBC WITH DIFFERENTIAL/PLATELET
Abs Immature Granulocytes: 0.18 10*3/uL — ABNORMAL HIGH (ref 0.00–0.07)
Basophils Absolute: 0 10*3/uL (ref 0.0–0.1)
Basophils Relative: 0 %
Eosinophils Absolute: 0.3 10*3/uL (ref 0.0–0.5)
Eosinophils Relative: 3 %
HCT: 21.1 % — ABNORMAL LOW (ref 39.0–52.0)
Hemoglobin: 6.9 g/dL — CL (ref 13.0–17.0)
Immature Granulocytes: 2 %
Lymphocytes Relative: 11 %
Lymphs Abs: 1.2 10*3/uL (ref 0.7–4.0)
MCH: 31.5 pg (ref 26.0–34.0)
MCHC: 32.7 g/dL (ref 30.0–36.0)
MCV: 96.3 fL (ref 80.0–100.0)
Monocytes Absolute: 1.3 10*3/uL — ABNORMAL HIGH (ref 0.1–1.0)
Monocytes Relative: 12 %
Neutro Abs: 8.3 10*3/uL — ABNORMAL HIGH (ref 1.7–7.7)
Neutrophils Relative %: 72 %
Platelets: 297 10*3/uL (ref 150–400)
RBC: 2.19 MIL/uL — ABNORMAL LOW (ref 4.22–5.81)
RDW: 14.1 % (ref 11.5–15.5)
WBC: 11.4 10*3/uL — ABNORMAL HIGH (ref 4.0–10.5)
nRBC: 0 % (ref 0.0–0.2)

## 2021-04-15 LAB — URINE CULTURE: Culture: >=100000 — AB

## 2021-04-15 LAB — COMPREHENSIVE METABOLIC PANEL
ALT: 19 U/L (ref 0–44)
AST: 25 U/L (ref 15–41)
Albumin: 2.7 g/dL — ABNORMAL LOW (ref 3.5–5.0)
Alkaline Phosphatase: 103 U/L (ref 38–126)
Anion gap: 7 (ref 5–15)
BUN: 67 mg/dL — ABNORMAL HIGH (ref 8–23)
CO2: 21 mmol/L — ABNORMAL LOW (ref 22–32)
Calcium: 8.4 mg/dL — ABNORMAL LOW (ref 8.9–10.3)
Chloride: 108 mmol/L (ref 98–111)
Creatinine, Ser: 2.49 mg/dL — ABNORMAL HIGH (ref 0.61–1.24)
GFR, Estimated: 26 mL/min — ABNORMAL LOW (ref >60)
Glucose, Bld: 205 mg/dL — ABNORMAL HIGH (ref 70–99)
Potassium: 4.1 mmol/L (ref 3.5–5.1)
Sodium: 136 mmol/L (ref 135–145)
Total Bilirubin: 0.7 mg/dL (ref 0.3–1.2)
Total Protein: 5.8 g/dL — ABNORMAL LOW (ref 6.5–8.1)

## 2021-04-15 LAB — GLUCOSE, CAPILLARY
Glucose-Capillary: 213 mg/dL — ABNORMAL HIGH (ref 70–99)
Glucose-Capillary: 200 mg/dL — ABNORMAL HIGH (ref 70–99)
Glucose-Capillary: 173 mg/dL — ABNORMAL HIGH (ref 70–99)
Glucose-Capillary: 242 mg/dL — ABNORMAL HIGH (ref 70–99)

## 2021-04-15 LAB — RENAL FUNCTION PANEL
Albumin: 2.7 g/dL — ABNORMAL LOW (ref 3.5–5.0)
Anion gap: 9 (ref 5–15)
BUN: 68 mg/dL — ABNORMAL HIGH (ref 8–23)
CO2: 20 mmol/L — ABNORMAL LOW (ref 22–32)
Calcium: 8.5 mg/dL — ABNORMAL LOW (ref 8.9–10.3)
Chloride: 107 mmol/L (ref 98–111)
Creatinine, Ser: 2.48 mg/dL — ABNORMAL HIGH (ref 0.61–1.24)
GFR, Estimated: 26 mL/min — ABNORMAL LOW (ref >60)
Glucose, Bld: 205 mg/dL — ABNORMAL HIGH (ref 70–99)
Phosphorus: 3.1 mg/dL (ref 2.5–4.6)
Potassium: 4.1 mmol/L (ref 3.5–5.1)
Sodium: 136 mmol/L (ref 135–145)

## 2021-04-15 LAB — PHOSPHORUS: Phosphorus: 3.1 mg/dL (ref 2.5–4.6)

## 2021-04-15 LAB — MAGNESIUM: Magnesium: 1.7 mg/dL (ref 1.7–2.4)

## 2021-04-15 MED ORDER — LEVALBUTEROL HCL 0.63 MG/3ML IN NEBU: 0.6300 mg | INHALATION_SOLUTION | Freq: Four times a day (QID) | RESPIRATORY_TRACT | Status: DC | PRN

## 2021-04-15 MED ORDER — IPRATROPIUM BROMIDE 0.02 % IN SOLN: 0.5000 mg | Freq: Four times a day (QID) | RESPIRATORY_TRACT | Status: DC | PRN

## 2021-04-15 MED ORDER — SODIUM CHLORIDE 0.9% IV SOLUTION: Freq: Once | INTRAVENOUS | Status: AC

## 2021-04-15 NOTE — Plan of Care (Signed)
?  Problem: Activity: ?Goal: Ability to tolerate increased activity will improve ?Outcome: Progressing ?  ?Problem: Health Behavior/Discharge Planning: ?Goal: Ability to manage health-related needs will improve ?Outcome: Progressing ?  ?Problem: Safety: ?Goal: Ability to remain free from injury will improve ?Outcome: Progressing ?  ?

## 2021-04-15 NOTE — Progress Notes (Signed)
?PROGRESS NOTE ? ? ? ?Allen Yu  GMW:[id number] DOB: February 07, 1945 DOA: 04/11/2021 ?PCP: Allen Noble, MD  ? ?Brief Narrative:  ?HPI per Dr. Jenetta Yu on 04/11/21 ? Allen Yu is a 76 y.o. male with medical history significant for dementia, schizophrenia, PTSD, systolic CHF, chronic indwelling Foley catheter, paroxysmal atrial fibrillation, diabetes mellitus, hypertension, left above-knee amputation. ?  ?Patient was in the ED yesterday with reports of hematuria.  Per notes patient's Foley catheter got caught on furniture and gave a tug 2 days prior. Bloody urine was noticed which had cleared up, but his urine remained dark so he was brought to the hospital.  WBC of 17.9.  Creatinine was 2.5.  The ED provider was concerned about a placated UTI with Foley catheter, 1 L fluid bolus and IV Rocephin was given, admission was recommended, but patient and wife declined, patient reported he had been in the hospital so much that he would rather " die at home if he has to".  Antibiotic prescription was given, patient left AGAINST MEDICAL ADVICE.  Foley was changed yesterday in the ED. ?  ?Patient was called to come back to the ED because blood cultures were positive.  On my evaluation, patient has baseline dementia, he answers simple question that he has had a cough, he refuses to answer any further question or cooperate with exam requesting to be left alone and to cover his head with the bed covers. ?  ?Talked to spouse, she reports patient has had a bad cough over the past 4 days.  No difficulty breathing.  Tmax of 99.5.  Patient would complain if he has any pain, she denies abdominal pain, no vomiting, had 1 episode of loose stool today.  He has not eaten in 4 days.  After trauma to his Foley she reports after the catheter days ago, he had bloody urine that cleared the next day.  After the Foley was changed in the ED yesterday, this morning, the sheets were soaked with blood.  At baseline patient's can  communicate and answer questions, he ambulates with a wheelchair. ?  ?ED Course: Tmax 98.  Heart rate 70-100.  Respiratory rate 12-20.  Blood pressure systolic down to 44/03, improved currently 90s to low 100s. ?WBC 20.5.  Creatinine 3.15.  Sodium 128.  Lactic acidosis of 2.2 > 1.9.  Hemoglobin 6.5.  Chest x-ray shows worsening pneumonia.  Renal CT study suggest emphysematous cystitis and possible colitis. ?1 unit PRBC ordered for transfusion.  IV cefepime started.  500 mill Ringer's lactate given. ? ?**Interim History ?Blood cultures have come back as E. coli with sensitivities that are pansensitive.  We have de-escalated his antibiotics from cefepime to ceftriaxone yesterday and will further consolidate his antibiotics to Unasyn and Flagyl.  Foley catheter was changed in the ED.  Patient was little confused and pulled out his IV this morning but is reconnected.  WBC remains but has been improving and trended down from 20.5 is now 14.1. ? ?Unfortunately renal function has not really improved so we will consult nephrology for further evaluation recommendations and continue gentle IV fluid hydration at 50 cc for now.  Nephrology Dr. Royce Yu to see the patient in the morning ? ?04/14/21: Overnight he pulled out his IV and was very agitated.  Refuses blood work this morning however is now improving after he finally agreed.  Nephrology feels that his AKI is in the setting of his bacteremia and Foley trauma as well as recently given Bactrim.  They  are recommending holding his ARB and his Foley has been changed.  They are recommending also continue to hold his torsemide and hold his diuretics.  He is not a candidate for dialysis should his renal function continue to worsen.  He was given low-dose IV fluids and will continue for now.  BUN/creatinine is slightly improved from yesterday.  We will need to continue monitor and continue to treat his E. coli bacteremia and  Enterococcus UTI. ?  ?04/15/21: Hemoglobin/hematocrit  dropped slightly to 6.9/21.1 and we will type and screen and transfuse another 1 unit PRBCs.  His IV fluid hydration has now stopped.  Renal function continues to improve daily and BUN/creatinine was 68/2.49.  I had a very lengthy discussion with the ID physician who recommends continuing the IV Unasyn and Flagyl and changing to p.o. Augmentin and Flagyl at discharge for total of 10 days of treatment.  Will order PT OT to further evaluate and treat anticipate discharging the patient home the next 24 to 48 hours if stable and if his creatinine continues to improve and blood count is stable. ?  ?Assessment and Plan: ?* Bacteremia ?E Coli Bacteremia  ?-Blood cultures done yesterday 3/20-rapid ID panel positive for E. coli.   ?-Likely source from patient's chronic indwelling Foley catheter.   ?-Urine culture 3/20 - multiple species present, recollection suggested and was sent and now showing >100,000 CFU of Enterococcus Faecalis ?-He has significant leukocytosis of 20.5 with repeat being 20.3 and has now trended down to 14.1. With lactic acid of 2.2 > 1.9.  Also was hypotensive down to 89 Systolic, with AKI.   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   ?-Patient's urinalysis showed a cloudy appearance with amber-colored urine, moderate hemoglobin, negative ketones, moderate leukocytes, negative nitrites, many bacteria, greater than 50 RBCs per high-power field, greater than 50 WBCs ?-Chest x-ray suggest worsening pneumonia. ?-Follow-up blood culture result and sensitivities as it shows pansensitive E. coli ?-New Urine Cx showing >100,000 CFU of Enterococcus Faecalis with sensitivites pending but ? Colonization as WBC is improving; Regardless patient is on IV Unasyn which will cover given that Enterococcus is Pansensitive  ?-Continued IV cefepime and changed to IV ceftriaxone and this has been de-escalated to IV Unasyn and he is on Flagyl for the Colitis  ?-Prior urine cultures  12/2020 grew Enterobacter cloacae and Enterococcus faecalis, sensitive to cefepime. ?-IVF now stopped and he will get 1 unit of pRBC's ?-Follow cultures and adjust antibiotics as necessary and case was discussed with Dr. West Bali of ID who feels the patient is on appropriate Abx and recommends Unasyn and Flagyl for 10 days total.  ? ?Pneumonia ?-Reports a cough.   ?-He is afebrile, has leukocytosis of 20.5 but is now trending down and went to 14.1 -> 11.1 -> 11.4  ?-Chest x-ray suggest worsening pneumonia as it showed "Increased left basilar opacity is noted concerning for worsening Pneumonia." ?-His O2 sats greater than 95% on room air. ?-SpO2: 92 % ?-We will add flutter valve, incentive spirometry and guaifenesin 1200 mg p.o. twice daily ?-IV cefepime and azithromycin but this has been consolidated to IV Unasyn ?-Will stop IVF hydration  ?-Repeat chest x-ray in the morning prior to D/C ? ?Schizophrenia, unspecified ([place]) ?-Has been Agitated recently so we will need to continue to monitor ?-Resumed Risperidone 1 tablet every 12 hours as needed as needed for restlessness and agitation ?-May need Haldol and psych consult but he has been intermittently agitated ? ?Emphysematous cystitis ?  Enterococcus Faecalis UTI, poA  ?-Suggested on renal CT stone study.   ?-Blood cultures growing E. Coli that is pansensitive.   ?-Has chronic indwelling Foley catheter- Foley was changed yesterday-3/20 in ED. ?-IV antibiotics for now and consolidate antibiotics to IV Unasyn and Flagyl and after discussion with ID they recommend continuing Unasyn and Flagyl and continuing Abx for a total of 10 days  ?-Initial Urine Cx showed Multiple species present but repeat shows >100,000 CFU of Enterococcus Faecalis and is pansensitive ?-IVF now stopped ?-May benefit from urology in-put but will recommend outpatient follow up  ? ?Anemia ?-Acute on chronic anemia.   ?-Hemoglobin down to 6.4, recent baseline 7.5-8.9. Anemia panel obtained in ED  prior to transfusion, ferritin elevated 662, iron indicis suggestive of anemia of chronic disease.  Folate and B12 levels normal.  ?-Spouse reports significant blood loss from catheter prior to admission  ?-He is status pos

## 2021-04-15 NOTE — Plan of Care (Signed)
  Problem: Health Behavior/Discharge Planning: Goal: Ability to manage health-related needs will improve Outcome: Progressing   

## 2021-04-16 ENCOUNTER — Inpatient Hospital Stay (HOSPITAL_COMMUNITY): Payer: Medicare Other

## 2021-04-16 DIAGNOSIS — N179 Acute kidney failure, unspecified: Secondary | ICD-10-CM | POA: Diagnosis not present

## 2021-04-16 DIAGNOSIS — I5022 Chronic systolic (congestive) heart failure: Secondary | ICD-10-CM | POA: Diagnosis not present

## 2021-04-16 DIAGNOSIS — R7881 Bacteremia: Secondary | ICD-10-CM | POA: Diagnosis not present

## 2021-04-16 DIAGNOSIS — D649 Anemia, unspecified: Secondary | ICD-10-CM | POA: Diagnosis not present

## 2021-04-16 LAB — CBC WITH DIFFERENTIAL/PLATELET
Abs Immature Granulocytes: 0.23 10*3/uL — ABNORMAL HIGH (ref 0.00–0.07)
Basophils Absolute: 0.1 10*3/uL (ref 0.0–0.1)
Basophils Relative: 0 %
Eosinophils Absolute: 0.4 10*3/uL (ref 0.0–0.5)
Eosinophils Relative: 3 %
HCT: 25.5 % — ABNORMAL LOW (ref 39.0–52.0)
Hemoglobin: 8.3 g/dL — ABNORMAL LOW (ref 13.0–17.0)
Immature Granulocytes: 2 %
Lymphocytes Relative: 10 %
Lymphs Abs: 1.3 10*3/uL (ref 0.7–4.0)
MCH: 30.6 pg (ref 26.0–34.0)
MCHC: 32.5 g/dL (ref 30.0–36.0)
MCV: 94.1 fL (ref 80.0–100.0)
Monocytes Absolute: 1.1 10*3/uL — ABNORMAL HIGH (ref 0.1–1.0)
Monocytes Relative: 9 %
Neutro Abs: 9.2 10*3/uL — ABNORMAL HIGH (ref 1.7–7.7)
Neutrophils Relative %: 76 %
Platelets: 300 10*3/uL (ref 150–400)
RBC: 2.71 MIL/uL — ABNORMAL LOW (ref 4.22–5.81)
RDW: 14.4 % (ref 11.5–15.5)
WBC: 12.1 10*3/uL — ABNORMAL HIGH (ref 4.0–10.5)
nRBC: 0 % (ref 0.0–0.2)

## 2021-04-16 LAB — COMPREHENSIVE METABOLIC PANEL
ALT: 21 U/L (ref 0–44)
AST: 34 U/L (ref 15–41)
Albumin: 2.6 g/dL — ABNORMAL LOW (ref 3.5–5.0)
Alkaline Phosphatase: 91 U/L (ref 38–126)
Anion gap: 7 (ref 5–15)
BUN: 49 mg/dL — ABNORMAL HIGH (ref 8–23)
CO2: 21 mmol/L — ABNORMAL LOW (ref 22–32)
Calcium: 8.5 mg/dL — ABNORMAL LOW (ref 8.9–10.3)
Chloride: 109 mmol/L (ref 98–111)
Creatinine, Ser: 1.98 mg/dL — ABNORMAL HIGH (ref 0.61–1.24)
GFR, Estimated: 35 mL/min — ABNORMAL LOW (ref >60)
Glucose, Bld: 143 mg/dL — ABNORMAL HIGH (ref 70–99)
Potassium: 3.7 mmol/L (ref 3.5–5.1)
Sodium: 137 mmol/L (ref 135–145)
Total Bilirubin: 0.6 mg/dL (ref 0.3–1.2)
Total Protein: 5.6 g/dL — ABNORMAL LOW (ref 6.5–8.1)

## 2021-04-16 LAB — TYPE AND SCREEN
ABO/RH(D): O POS
Antibody Screen: NEGATIVE
Unit division: 0

## 2021-04-16 LAB — BPAM RBC
Blood Product Expiration Date: 202304182359
ISSUE DATE / TIME: 202303251348
Unit Type and Rh: 5100

## 2021-04-16 LAB — PHOSPHORUS: Phosphorus: 3.8 mg/dL (ref 2.5–4.6)

## 2021-04-16 LAB — MAGNESIUM: Magnesium: 1.7 mg/dL (ref 1.7–2.4)

## 2021-04-16 LAB — GLUCOSE, CAPILLARY
Glucose-Capillary: 144 mg/dL — ABNORMAL HIGH (ref 70–99)
Glucose-Capillary: 184 mg/dL — ABNORMAL HIGH (ref 70–99)
Glucose-Capillary: 227 mg/dL — ABNORMAL HIGH (ref 70–99)

## 2021-04-16 MED ORDER — IPRATROPIUM BROMIDE 0.02 % IN SOLN
0.5000 mg | Freq: Four times a day (QID) | RESPIRATORY_TRACT | Status: DC
  Administered 2021-04-16 – 2021-04-17 (×3): 0.5 mg via RESPIRATORY_TRACT
  Filled 2021-04-16 (×3): qty 2.5

## 2021-04-16 MED ORDER — LEVALBUTEROL HCL 0.63 MG/3ML IN NEBU
0.6300 mg | INHALATION_SOLUTION | Freq: Four times a day (QID) | RESPIRATORY_TRACT | Status: DC
  Administered 2021-04-16 – 2021-04-17 (×3): 0.63 mg via RESPIRATORY_TRACT
  Filled 2021-04-16 (×3): qty 3

## 2021-04-16 MED ORDER — FUROSEMIDE 10 MG/ML IJ SOLN
20.0000 mg | Freq: Once | INTRAMUSCULAR | Status: AC
  Administered 2021-04-16: 20 mg via INTRAVENOUS
  Filled 2021-04-16: qty 2

## 2021-04-16 NOTE — Progress Notes (Signed)
?PROGRESS NOTE ? ? ? ?RAYDER SULLENGER  WUX:[id number] DOB: March 11, 1945 DOA: 04/11/2021 ?PCP: [name], MD  ? ?Brief Narrative:  ?HPI per Dr. Jenetta Downer on 04/11/21 ? JAYVIAN ESCOE is a 76 y.o. male with medical history significant for dementia, schizophrenia, PTSD, systolic CHF, chronic indwelling Foley catheter, paroxysmal atrial fibrillation, diabetes mellitus, hypertension, left above-knee amputation. ?  ?Patient was in the ED yesterday with reports of hematuria.  Per notes patient's Foley catheter got caught on furniture and gave a tug 2 days prior. Bloody urine was noticed which had cleared up, but his urine remained dark so he was brought to the hospital.  WBC of 17.9.  Creatinine was 2.5.  The ED provider was concerned about a placated UTI with Foley catheter, 1 L fluid bolus and IV Rocephin was given, admission was recommended, but patient and wife declined, patient reported he had been in the hospital so much that he would rather " die at home if he has to".  Antibiotic prescription was given, patient left AGAINST MEDICAL ADVICE.  Foley was changed yesterday in the ED. ?  ?Patient was called to come back to the ED because blood cultures were positive.  On my evaluation, patient has baseline dementia, he answers simple question that he has had a cough, he refuses to answer any further question or cooperate with exam requesting to be left alone and to cover his head with the bed covers. ?  ?Talked to spouse, she reports patient has had a bad cough over the past 4 days.  No difficulty breathing.  Tmax of 99.5.  Patient would complain if he has any pain, she denies abdominal pain, no vomiting, had 1 episode of loose stool today.  He has not eaten in 4 days.  After trauma to his Foley she reports after the catheter days ago, he had bloody urine that cleared the next day.  After the Foley was changed in the ED yesterday, this morning, the sheets were soaked with blood.  At baseline patient's can  communicate and answer questions, he ambulates with a wheelchair. ?  ?ED Course: Tmax 98.  Heart rate 70-100.  Respiratory rate 12-20.  Blood pressure systolic down to 25/36, improved currently 90s to low 100s. ?WBC 20.5.  Creatinine 3.15.  Sodium 128.  Lactic acidosis of 2.2 > 1.9.  Hemoglobin 6.5.  Chest x-ray shows worsening pneumonia.  Renal CT study suggest emphysematous cystitis and possible colitis. ?1 unit PRBC ordered for transfusion.  IV cefepime started.  500 mill Ringer's lactate given. ? ?**Interim History ?Blood cultures have come back as E. coli with sensitivities that are pansensitive.  We have de-escalated his antibiotics from cefepime to ceftriaxone yesterday and will further consolidate his antibiotics to Unasyn and Flagyl.  Foley catheter was changed in the ED.  Patient was little confused and pulled out his IV this morning but is reconnected.  WBC remains but has been improving and trended down from 20.5 is now 14.1. ? ?Unfortunately renal function has not really improved so we will consult nephrology for further evaluation recommendations and continue gentle IV fluid hydration at 50 cc for now.  Nephrology Dr. Royce Macadamia to see the patient in the morning ? ?04/14/21: Overnight he pulled out his IV and was very agitated.  Refuses blood work this morning however is now improving after he finally agreed.  Nephrology feels that his AKI is in the setting of his bacteremia and Foley trauma as well as recently given Bactrim.  They  are recommending holding his ARB and his Foley has been changed.  They are recommending also continue to hold his torsemide and hold his diuretics.  He is not a candidate for dialysis should his renal function continue to worsen.  He was given low-dose IV fluids and will continue for now.  BUN/creatinine is slightly improved from yesterday.  We will need to continue monitor and continue to treat his E. coli bacteremia and  Enterococcus UTI. ?  ?04/15/21: Hemoglobin/hematocrit  dropped slightly to 6.9/21.1 and we will type and screen and transfuse another 1 unit PRBCs.  His IV fluid hydration has now stopped.  Renal function continues to improve daily and BUN/creatinine was 68/2.49.  I had a very lengthy discussion with the ID physician who recommends continuing the IV Unasyn and Flagyl and changing to p.o. Augmentin and Flagyl at discharge for total of 10 days of treatment.  Will order PT OT to further evaluate and treat anticipate discharging the patient home the next 24 to 48 hours if stable and if his creatinine continues to improve and blood count is stable. ? ?Assessment and Plan: ?* Bacteremia ?E Coli Bacteremia  ?-Blood cultures done yesterday 3/20-rapid ID panel positive for E. coli.   ?-Likely source from patient's chronic indwelling Foley catheter.   ?-Urine culture 3/20 - multiple species present, recollection suggested and was sent and now showing >100,000 CFU of Enterococcus Faecalis ?-He has significant leukocytosis of 20.5 with repeat being 20.3 and has now trended down to 14.1. With lactic acid of 2.2 > 1.9.  Also was hypotensive down to 89 Systolic, with AKI.   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   ?-Patient's urinalysis showed a cloudy appearance with amber-colored urine, moderate hemoglobin, negative ketones, moderate leukocytes, negative nitrites, many bacteria, greater than 50 RBCs per high-power field, greater than 50 WBCs ?-Chest x-ray suggest worsening pneumonia. ?-Follow-up blood culture result and sensitivities as it shows pansensitive E. coli ?-New Urine Cx showing >100,000 CFU of Enterococcus Faecalis with sensitivites pending but ? Colonization as WBC is improving; Regardless patient is on IV Unasyn which will cover given that Enterococcus is Pansensitive  ?-Continued IV cefepime and changed to IV ceftriaxone and this has been de-escalated to IV Unasyn and he is on Flagyl for the Colitis  ?-Prior urine cultures  12/2020 grew Enterobacter cloacae and Enterococcus faecalis, sensitive to cefepime. ?-IVF now stopped and he will get 1 unit of pRBC's ?-Follow cultures and adjust antibiotics as necessary and case was discussed with Dr. West Bali of ID who feels the patient is on appropriate Abx and recommends Unasyn and Flagyl for 10 days total.  ? ?Pneumonia ?-Reports a cough.   ?-He is afebrile, had leukocytosis of 20.5 but was trending down and went to 14.1 -> 11.1 and is now trending up -> 11.4 -> 12.1 ?-Chest x-ray suggest worsening pneumonia as it showed "Increased left basilar opacity is noted concerning for worsening Pneumonia." ?-Repeat CXR 04/16/21 showed "Cardiomegaly is again noted, and mild central vascular distention without overt edema. There is increasing layering moderate left pleural effusion and worsening consolidation in the left mid and lower lung field with increased opacity now extending to the mid field. Left apex and right lung remain clear. 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 ?-SpO2: 99 % ?-We will add flutter valve, incentive spirometry and guaifenesin 1200 mg p.o. twice daily ?-On Breathing Treatments but will make them Scheduled  ?-  IV cefepime and azithromycin but this has been consolidated to IV Unasyn ?-IVF hydration  ?-Repeat chest x-ray again ? ?Schizophrenia, unspecified ([place]) ?-Has been Agitated recently so we will need to continue to monitor ?-Resumed Risperidone 1 tablet every 12 hours as needed as needed for restlessness and agitation ?-May need Haldol and psych consult but he has been intermittently agitated ? ?Emphysematous cystitis ?Enterococcus Faecalis UTI, poA  ?-Suggested on renal CT stone study.   ?-Blood cultures growing E. Coli that is pansensitive.   ?-Has chronic indwelling Foley catheter- Foley was changed yesterday-3/20 in ED. ?-IV antibiotics for now and consolidate antibiotics to IV  Unasyn and Flagyl and after discussion with ID they recommend continuing Unasyn and Flagyl and continuing Abx for a total of 10 days  ?-Initial Urine Cx showed Multiple species present but repeat shows >100,000 CFU of Enter

## 2021-04-16 NOTE — Evaluation (Signed)
Physical Therapy Evaluation ?Patient Details ?Name: Allen Yu ?MRN: [id number] ?DOB: 07-Nov-1945 ?Today's Date: 04/16/2021 ? ?History of Present Illness ? Allen Yu is a 76 y.o. male with medical history significant for dementia, schizophrenia, PTSD, systolic CHF, chronic indwelling Foley catheter, paroxysmal atrial fibrillation, diabetes mellitus, hypertension, left above-knee amputation.     Patient was in the ED yesterday with reports of hematuria.  Per notes patient's Foley catheter got caught on furniture and gave a tug 2 days prior. Bloody urine was noticed which had cleared up, but his urine remained dark so he was brought to the hospital.  WBC of 17.9.  Creatinine was 2.5.  The ED provider was concerned about a placated UTI with Foley catheter, 1 L fluid bolus and IV Rocephin was given, admission was recommended, but patient and wife declined, patient reported he had been in the hospital so much that he would rather " die at home if he has to".  Antibiotic prescription was given, patient left AGAINST MEDICAL ADVICE.  Foley was changed yesterday in the ED.     Patient was called to come back to the ED because blood cultures were positive.  On my evaluation, patient has baseline dementia, he answers simple question that he has had a cough, he refuses to answer any further question or cooperate with exam requesting to be left alone and to cover his head with the bed covers.     Talked to spouse, she reports patient has had a bad cough over the past 4 days.  No difficulty breathing.  Tmax of 99.5.  Patient would complain if he has any pain, she denies abdominal pain, no vomiting, had 1 episode of loose stool today.  He has not eaten in 4 days.  After trauma to his Foley she reports after the catheter days ago, he had bloody urine that cleared the next day.  After the Foley was changed in the ED yesterday, this morning, the sheets were soaked with blood.  At baseline patient's can communicate and answer  questions, he ambulates with a wheelchair. ?  ?Clinical Impression ? Patient functioning near baseline for functional mobility and gait demonstrating poor tolerance for attempting to stand pivot on RLE due to weakness, normally slides over to his w/c at home with family assisting, "per patient".  Patient able to use BUE to lean on armrest of lounge chair during transfer requiring Mod/max assist and tolerated staying up in chair with SpO2 at 100% on room air - nurse and MD notified.  Patient will benefit from continued skilled physical therapy in hospital and recommended venue below to increase strength, balance, endurance for safe ADLs and gait.  ?   ?   ? ?Recommendations for follow up therapy are one component of a multi-disciplinary discharge planning process, led by the attending physician.  Recommendations may be updated based on patient status, additional functional criteria and insurance authorization. ? ?Follow Up Recommendations Home health PT ? ?  ?Assistance Recommended at Discharge Set up Supervision/Assistance  ?Patient can return home with the following ? A lot of help with walking and/or transfers;A lot of help with bathing/dressing/bathroom;Help with stairs or ramp for entrance;Assistance with cooking/housework ? ?  ?Equipment Recommendations None recommended by PT  ?Recommendations for Other Services ?    ?  ?Functional Status Assessment Patient has had a recent decline in their functional status and demonstrates the ability to make significant improvements in function in a reasonable and predictable amount of time.  ? ?  ?  Precautions / Restrictions Precautions ?Precautions: Fall ?Restrictions ?Weight Bearing Restrictions: Yes ?LLE Weight Bearing: Non weight bearing  ? ?  ? ?Mobility ? Bed Mobility ?Overal bed mobility: Needs Assistance ?Bed Mobility: Supine to Sit ?  ?  ?Supine to sit: Mod assist ?  ?  ?General bed mobility comments: increased time, labored movement ?  ? ?Transfers ?Overall  transfer level: Needs assistance ?Equipment used: 1 person hand held assist ?Transfers: Sit to/from Stand, Bed to chair/wheelchair/BSC ?  ?Stand pivot transfers: Mod assist, Max assist ?  ?  ?  ?  ?General transfer comment: patient had diffiuclty holding onto armrest of lounge chair, able to support self partially with RLE, probably would do better using his w/c ?  ? ?Ambulation/Gait ?  ?  ?  ?  ?  ?  ?  ?  ? ?Stairs ?  ?  ?  ?  ?  ? ?Wheelchair Mobility ?  ? ?Modified Rankin (Stroke Patients Only) ?  ? ?  ? ?Balance Overall balance assessment: Needs assistance ?Sitting-balance support: Feet supported, No upper extremity supported ?Sitting balance-Leahy Scale: Fair ?Sitting balance - Comments: fair/good seated at EOB ?  ?Standing balance support: Reliant on assistive device for balance, During functional activity, Bilateral upper extremity supported ?Standing balance-Leahy Scale: Poor ?Standing balance comment: leaning on armrest of chair ?  ?  ?  ?  ?  ?  ?  ?  ?  ?  ?  ?   ? ? ? ?Pertinent Vitals/Pain Pain Assessment ?Pain Assessment: No/denies pain  ? ? ?Home Living Family/patient expects to be discharged to:: Private residence ?Living Arrangements: Spouse/significant other ?Available Help at Discharge: Family;Personal care attendant;Available 24 hours/day ?Type of Home: House ?Home Access: Ramped entrance ?  ?  ?Alternate Level Stairs-Number of Steps: full flight ?Home Layout: Two level;Able to live on main level with bedroom/bathroom;Other (Comment) ?Home Equipment: Conservation officer, nature (2 wheels);Wheelchair - manual ?Additional Comments: all information obtained from chart review, pt unable to answer questions accurately. Does report he lives with his wife  ?  ?Prior Function Prior Level of Function : Needs assist ?  ?  ?  ?Physical Assist : Mobility (physical);ADLs (physical) ?Mobility (physical): Bed mobility;Transfers ?ADLs (physical): Grooming;Bathing;Dressing;Toileting;IADLs ?Mobility Comments: Per chart review  pt uses wheelchair and slides between surfaces ?ADLs Comments: Wife assists with all ADLs per chart review ?  ? ? ?Hand Dominance  ? Dominant Hand: Right ? ?  ?Extremity/Trunk Assessment  ? Upper Extremity Assessment ?Upper Extremity Assessment: Defer to OT evaluation ?  ? ?Lower Extremity Assessment ?Lower Extremity Assessment: Generalized weakness ?  ? ?Cervical / Trunk Assessment ?Cervical / Trunk Assessment: Normal  ?Communication  ? Communication: HOH  ?Cognition Arousal/Alertness: Awake/alert ?Behavior During Therapy: [facility] for tasks assessed/performed, Restless ?Overall Cognitive Status: History of cognitive impairments - at baseline ?  ?  ?  ?  ?  ?  ?  ?  ?  ?  ?  ?  ?  ?  ?  ?  ?  ?  ?  ? ?  ?General Comments   ? ?  ?Exercises    ? ?Assessment/Plan  ?  ?PT Assessment Patient needs continued PT services  ?PT Problem List Decreased strength;Decreased activity tolerance;Decreased balance;Decreased mobility ? ?   ?  ?PT Treatment Interventions DME instruction;Functional mobility training;Therapeutic activities;Therapeutic exercise;Wheelchair mobility training;Patient/family education;Balance training   ? ?PT Goals (Current goals can be found in the Care Plan section)  ?Acute Rehab PT Goals ?Patient Stated Goal:  return home with family to assist ?PT Goal Formulation: With patient ?Time For Goal Achievement: 04/19/21 ?Potential to Achieve Goals: Good ? ?  ?Frequency Min 2X/week ?  ? ? ?Co-evaluation   ?  ?  ?  ?  ? ? ?  ?AM-PAC PT "6 Clicks" Mobility  ?Outcome Measure Help needed turning from your back to your side while in a flat bed without using bedrails?: A Little ?Help needed moving from lying on your back to sitting on the side of a flat bed without using bedrails?: A Lot ?Help needed moving to and from a bed to a chair (including a wheelchair)?: A Lot ?Help needed standing up from a chair using your arms (e.g., wheelchair or bedside chair)?: A Lot ?Help needed to walk in hospital room?: Total ?Help needed  climbing 3-5 steps with a railing? : Total ?6 Click Score: 11 ? ?  ?End of Session   ?Activity Tolerance: Patient tolerated treatment well;Patient limited by fatigue ?Patient left: in chair;with call bell/phon

## 2021-04-16 NOTE — Plan of Care (Signed)
?  Problem: Acute Rehab PT Goals(only PT should resolve) ?Goal: Pt Will Go Supine/Side To Sit ?Outcome: Progressing ?Flowsheets (Taken 04/16/2021 1159) ?Pt will go Supine/Side to Sit: ? with minimal assist ? with moderate assist ?Goal: Patient Will Perform Sitting Balance ?Outcome: Progressing ?Flowsheets (Taken 04/16/2021 1159) ?Patient will perform sitting balance: with modified independence ?Goal: Patient Will Transfer Sit To/From Stand ?Outcome: Progressing ?Flowsheets (Taken 04/16/2021 1159) ?Patient will transfer sit to/from stand: with moderate assist ?Goal: Pt Will Transfer Bed To Chair/Chair To Bed ?Outcome: Progressing ?Flowsheets (Taken 04/16/2021 1159) ?Pt will Transfer Bed to Chair/Chair to Bed: with min assist ?Note: Sliding board if necessary ?  ?12:00 PM, 04/16/21 ?Lonell Grandchild, MPT ?Physical Therapist with [place] ?[facility] ?[phone] office ?[phone] mobile phone ? ?

## 2021-04-17 ENCOUNTER — Inpatient Hospital Stay (HOSPITAL_COMMUNITY): Payer: Medicare Other

## 2021-04-17 ENCOUNTER — Encounter (HOSPITAL_COMMUNITY): Payer: TRICARE For Life (TFL) | Admitting: Physical Therapy

## 2021-04-17 ENCOUNTER — Encounter (HOSPITAL_COMMUNITY): Payer: Self-pay | Admitting: Internal Medicine

## 2021-04-17 DIAGNOSIS — N179 Acute kidney failure, unspecified: Secondary | ICD-10-CM | POA: Diagnosis not present

## 2021-04-17 DIAGNOSIS — K529 Noninfective gastroenteritis and colitis, unspecified: Secondary | ICD-10-CM | POA: Diagnosis not present

## 2021-04-17 DIAGNOSIS — I5022 Chronic systolic (congestive) heart failure: Secondary | ICD-10-CM | POA: Diagnosis not present

## 2021-04-17 DIAGNOSIS — R7881 Bacteremia: Secondary | ICD-10-CM | POA: Diagnosis not present

## 2021-04-17 LAB — CBC WITH DIFFERENTIAL/PLATELET
Abs Immature Granulocytes: 0.2 10*3/uL — ABNORMAL HIGH (ref 0.00–0.07)
Basophils Absolute: 0.1 10*3/uL (ref 0.0–0.1)
Basophils Relative: 1 %
Eosinophils Absolute: 0.2 10*3/uL (ref 0.0–0.5)
Eosinophils Relative: 2 %
HCT: 25.6 % — ABNORMAL LOW (ref 39.0–52.0)
Hemoglobin: 8.6 g/dL — ABNORMAL LOW (ref 13.0–17.0)
Immature Granulocytes: 2 %
Lymphocytes Relative: 10 %
Lymphs Abs: 1.3 10*3/uL (ref 0.7–4.0)
MCH: 31.5 pg (ref 26.0–34.0)
MCHC: 33.6 g/dL (ref 30.0–36.0)
MCV: 93.8 fL (ref 80.0–100.0)
Monocytes Absolute: 0.8 10*3/uL (ref 0.1–1.0)
Monocytes Relative: 6 %
Neutro Abs: 10.1 10*3/uL — ABNORMAL HIGH (ref 1.7–7.7)
Neutrophils Relative %: 79 %
Platelets: 344 10*3/uL (ref 150–400)
RBC: 2.73 MIL/uL — ABNORMAL LOW (ref 4.22–5.81)
RDW: 14.2 % (ref 11.5–15.5)
WBC: 12.6 10*3/uL — ABNORMAL HIGH (ref 4.0–10.5)
nRBC: 0 % (ref 0.0–0.2)

## 2021-04-17 LAB — MAGNESIUM: Magnesium: 1.7 mg/dL (ref 1.7–2.4)

## 2021-04-17 LAB — GLUCOSE, CAPILLARY
Glucose-Capillary: 226 mg/dL — ABNORMAL HIGH (ref 70–99)
Glucose-Capillary: 126 mg/dL — ABNORMAL HIGH (ref 70–99)

## 2021-04-17 LAB — BODY FLUID CELL COUNT WITH DIFFERENTIAL
Eos, Fluid: 0 %
Lymphs, Fluid: 83 %
Monocyte-Macrophage-Serous Fluid: 12 % — ABNORMAL LOW (ref 50–90)
Neutrophil Count, Fluid: 5 % (ref 0–25)
Total Nucleated Cell Count, Fluid: 573 cu mm (ref 0–1000)

## 2021-04-17 LAB — GLUCOSE, PLEURAL OR PERITONEAL FLUID: Glucose, Fluid: 136 mg/dL

## 2021-04-17 LAB — COMPREHENSIVE METABOLIC PANEL
ALT: 25 U/L (ref 0–44)
AST: 37 U/L (ref 15–41)
Albumin: 2.8 g/dL — ABNORMAL LOW (ref 3.5–5.0)
Alkaline Phosphatase: 104 U/L (ref 38–126)
Anion gap: 6 (ref 5–15)
BUN: 41 mg/dL — ABNORMAL HIGH (ref 8–23)
CO2: 22 mmol/L (ref 22–32)
Calcium: 8.5 mg/dL — ABNORMAL LOW (ref 8.9–10.3)
Chloride: 107 mmol/L (ref 98–111)
Creatinine, Ser: 1.75 mg/dL — ABNORMAL HIGH (ref 0.61–1.24)
GFR, Estimated: 40 mL/min — ABNORMAL LOW (ref >60)
Glucose, Bld: 227 mg/dL — ABNORMAL HIGH (ref 70–99)
Potassium: 4 mmol/L (ref 3.5–5.1)
Sodium: 135 mmol/L (ref 135–145)
Total Bilirubin: 0.8 mg/dL (ref 0.3–1.2)
Total Protein: 6 g/dL — ABNORMAL LOW (ref 6.5–8.1)

## 2021-04-17 LAB — ALBUMIN, PLEURAL OR PERITONEAL FLUID: Albumin, Fluid: <1.5 g/dL

## 2021-04-17 LAB — GRAM STAIN

## 2021-04-17 LAB — LACTATE DEHYDROGENASE, PLEURAL OR PERITONEAL FLUID: LD, Fluid: 75 U/L — ABNORMAL HIGH (ref 3–23)

## 2021-04-17 LAB — PROTEIN, PLEURAL OR PERITONEAL FLUID: Total protein, fluid: <3 g/dL

## 2021-04-17 LAB — PHOSPHORUS: Phosphorus: 3.9 mg/dL (ref 2.5–4.6)

## 2021-04-17 MED ORDER — TORSEMIDE 20 MG PO TABS: 30.0000 mg | ORAL_TABLET | Freq: Every day | ORAL | Status: DC

## 2021-04-17 MED ORDER — LIDOCAINE HCL (PF) 2 % IJ SOLN
10.0000 mL | Freq: Once | INTRAMUSCULAR | Status: AC
  Administered 2021-04-17: 10 mL

## 2021-04-17 MED ORDER — LEVALBUTEROL HCL 0.63 MG/3ML IN NEBU: 0.6300 mg | INHALATION_SOLUTION | Freq: Four times a day (QID) | RESPIRATORY_TRACT | Status: DC | PRN

## 2021-04-17 MED ORDER — TORSEMIDE 20 MG PO TABS: 10.0000 mg | ORAL_TABLET | Freq: Every day | ORAL | Status: DC

## 2021-04-17 MED ORDER — IPRATROPIUM BROMIDE 0.02 % IN SOLN: 0.5000 mg | Freq: Two times a day (BID) | RESPIRATORY_TRACT | Status: DC

## 2021-04-17 MED ORDER — LEVALBUTEROL HCL 0.63 MG/3ML IN NEBU: 0.6300 mg | INHALATION_SOLUTION | Freq: Two times a day (BID) | RESPIRATORY_TRACT | Status: DC

## 2021-04-17 MED ORDER — FUROSEMIDE 10 MG/ML IJ SOLN
20.0000 mg | Freq: Once | INTRAMUSCULAR | Status: AC
  Administered 2021-04-17: 20 mg via INTRAVENOUS
  Filled 2021-04-17: qty 2

## 2021-04-17 MED ORDER — AMOXICILLIN-POT CLAVULANATE 500-125 MG PO TABS: 1.0000 | ORAL_TABLET | Freq: Two times a day (BID) | ORAL | 0 refills | Status: AC

## 2021-04-17 MED ORDER — METRONIDAZOLE 500 MG PO TABS: 500.0000 mg | ORAL_TABLET | Freq: Two times a day (BID) | ORAL | 0 refills | Status: AC

## 2021-04-17 NOTE — Progress Notes (Signed)
Inpatient Diabetes Program Recommendations ? ?AACE/ADA: New Consensus Statement on Inpatient Glycemic Control (2015) ? ?Target Ranges:  Prepandial:   less than 140 mg/dL ?     Peak postprandial:   less than 180 mg/dL (1-2 hours) ?     Critically ill patients:  140 - 180 mg/dL  ? ?Lab Results  ?Component Value Date  ? GLUCAP 226 (H) 04/17/2021  ? HGBA1C 8.8 (H) 01/14/2021  ? ? ?Review of Glycemic Control ? Latest Reference Range & Units 04/16/21 07:40 04/16/21 11:18 04/16/21 16:28 04/17/21 07:56  ?Glucose-Capillary 70 - 99 mg/dL 144 (H) 184 (H) 227 (H) 226 (H)  ? ?Diabetes history: DM 2 ?Outpatient Diabetes medications: Lantus 25 units if glucose 100-300, give 30 units fir glucose >300. ?Current orders for Inpatient glycemic control:  ?Semglee 10 units ?Novolog 0-9 units tid + hs ? ?Inpatient Diabetes Program Recommendations:   ? ?-  consider adding Novolog 2 units tid meal coverage if eating >50% of meals ? ?Thanks, ? ?Tama Headings RN, MSN, BC-ADM ?Inpatient Diabetes Coordinator ?Team Pager [phone] (8a-5p) ? ? ? ?

## 2021-04-17 NOTE — Progress Notes (Signed)
Pt transferred from 337 to 308 for scheduled maintenance, Pt belongings brought from 337 to 308 including sweatsuit, t shirt/underclothes and 1 slipper shoe.  ?

## 2021-04-17 NOTE — Progress Notes (Signed)
PT tolerated left sided thoracentesis procedure well today and 240 mL of clear yellow fluid removed with labs collected and sent for processing. PT taken via stretcher for chest xray post procedure and read by radiologist prior to being transported back to inpatient bed assignment with no acute distress noted.  ?

## 2021-04-17 NOTE — Discharge Summary (Signed)
Physician Discharge Summary  ?Allen Yu WUJ:[id number] DOB: Feb 06, 1945 DOA: 04/11/2021 ? ?PCP: [name], MD ? ?Admit date: 04/11/2021 ?Discharge date: 04/17/2021 ? ?Admitted From: Home ?Disposition: Home with Home Health  ? ?Recommendations for Outpatient Follow-up:  ?Follow up with PCP in 1-2 weeks and repeat CBC, CMP, mag, Phos within 1 week; patient has an appointment with his primary care physician on 04/20/2021 ?Follow-up with urology in outpatient setting and has an appointment with the urologist on 04/26/2021 ?Please repeat chest x-ray in 3 to 4 weeks. ?Complete antibiotic course ?Please follow up on the following pending results: Cytology results and fluid studies sent off from his thoracentesis ? ?Home Health: Yes  ?Equipment/Devices: None   ? ?Discharge Condition: Stable  ?CODE STATUS: DO NOT RESUSCITATE  ?Diet recommendation: Cardiac Diet  ? ?Brief/Interim Summary: ?HPI per Dr. Jenetta Downer on 04/11/21 ? Allen Yu is a 76 y.o. male with medical history significant for dementia, schizophrenia, PTSD, systolic CHF, chronic indwelling Foley catheter, paroxysmal atrial fibrillation, diabetes mellitus, hypertension, left above-knee amputation. ?  ?Patient was in the ED yesterday with reports of hematuria.  Per notes patient's Foley catheter got caught on furniture and gave a tug 2 days prior. Bloody urine was noticed which had cleared up, but his urine remained dark so he was brought to the hospital.  WBC of 17.9.  Creatinine was 2.5.  The ED provider was concerned about a placated UTI with Foley catheter, 1 L fluid bolus and IV Rocephin was given, admission was recommended, but patient and wife declined, patient reported he had been in the hospital so much that he would rather " die at home if he has to".  Antibiotic prescription was given, patient left AGAINST MEDICAL ADVICE.  Foley was changed yesterday in the ED. ?  ?Patient was called to come back to the ED because blood cultures were positive.   On my evaluation, patient has baseline dementia, he answers simple question that he has had a cough, he refuses to answer any further question or cooperate with exam requesting to be left alone and to cover his head with the bed covers. ?  ?Talked to spouse, she reports patient has had a bad cough over the past 4 days.  No difficulty breathing.  Tmax of 99.5.  Patient would complain if he has any pain, she denies abdominal pain, no vomiting, had 1 episode of loose stool today.  He has not eaten in 4 days.  After trauma to his Foley she reports after the catheter days ago, he had bloody urine that cleared the next day.  After the Foley was changed in the ED yesterday, this morning, the sheets were soaked with blood.  At baseline patient's can communicate and answer questions, he ambulates with a wheelchair. ?  ?ED Course: Tmax 98.  Heart rate 70-100.  Respiratory rate 12-20.  Blood pressure systolic down to 95/62, improved currently 90s to low 100s. ?WBC 20.5.  Creatinine 3.15.  Sodium 128.  Lactic acidosis of 2.2 > 1.9.  Hemoglobin 6.5.  Chest x-ray shows worsening pneumonia.  Renal CT study suggest emphysematous cystitis and possible colitis. ?1 unit PRBC ordered for transfusion.  IV cefepime started.  500 mill Ringer's lactate given. ?  ?**Interim History ?Blood cultures have come back as E. coli with sensitivities that are pansensitive.  We have de-escalated his antibiotics from cefepime to ceftriaxone yesterday and will further consolidate his antibiotics to Unasyn and Flagyl.  Foley catheter was changed in the ED.  Patient was little confused and pulled out his IV this morning but is reconnected.  WBC remains but has been improving and trended down from 20.5 is now 14.1. ?  ?Unfortunately renal function has not really improved so we will consult nephrology for further evaluation recommendations and continue gentle IV fluid hydration at 50 cc for now.  Nephrology Dr. Royce Macadamia to see the patient in the morning ?   ?04/14/21: Overnight he pulled out his IV and was very agitated.  Refuses blood work this morning however is now improving after he finally agreed.  Nephrology feels that his AKI is in the setting of his bacteremia and Foley trauma as well as recently given Bactrim.  They are recommending holding his ARB and his Foley has been changed.  They are recommending also continue to hold his torsemide and hold his diuretics.  He is not a candidate for dialysis should his renal function continue to worsen.  He was given low-dose IV fluids and will continue for now.  BUN/creatinine is slightly improved from yesterday.  We will need to continue monitor and continue to treat his E. coli bacteremia and  Enterococcus UTI. ?  ?04/15/21: Hemoglobin/hematocrit dropped slightly to 6.9/21.1 and we will type and screen and transfuse another 1 unit PRBCs.  His IV fluid hydration has now stopped.  Renal function continues to improve daily and BUN/creatinine was 68/2.49.  I had a very lengthy discussion with the ID physician who recommends continuing the IV Unasyn and Flagyl and changing to p.o. Augmentin and Flagyl at discharge for total of 10 days of treatment.  Will order PT OT to further evaluate and treat anticipate discharging the patient home the next 24 to 48 hours if stable and if his creatinine continues to improve and blood count is stable. ? ?3/26-3/27.  Patient was given IV Lasix twice enteral and had a thoracentesis today done by Dr. Lavonia Dana which yielded 240 mL.  WBC slowly started trending up but renal function significantly improved and was close to his baseline.  Nephrology signed off the case and recommended outpatient follow-up.  I spoke with ID Dr. Juleen China who recommends continuing the Augmentin and the Flagyl twice a day for 5 more days.  He will need a repeat chest x-ray in 3 to 6 weeks.  Patient was deemed medically stable to be discharged home at this time and follow-up with his PCP as well as urologist in the  outpatient setting. ? ?Discharge Diagnoses:  ?Principal Problem: ?  Bacteremia ?Active Problems: ?  Acute kidney injury superimposed on chronic kidney disease ([place]) ?  Anemia ?  Emphysematous cystitis ?  Schizophrenia, unspecified ([place]) ?  Pneumonia ?  Chronic systolic CHF (congestive heart failure) ([place]) ?  Unspecified atrial fibrillation ([place]) ?  Peripheral vascular disease ([place]) ?  Chronic kidney disease, stage 3 unspecified ([place]) ?  Type 2 diabetes mellitus with other circulatory complications ([place]) ?  Hyponatremia ?  Unspecified dementia, unspecified severity, without behavioral disturbance, psychotic disturbance, mood disturbance, and anxiety ([place]) ?  Colitis ?  Hypokalemia ? ?* Bacteremia ?E Coli Bacteremia  ?-Blood cultures done yesterday 3/20-rapid ID panel positive for E. coli.   ?-Likely source from patient's chronic indwelling Foley catheter.   ?-Urine culture 3/20 - multiple species present, recollection suggested and was sent and now showing >100,000 CFU of Enterococcus Faecalis ?-He has significant leukocytosis of 20.5 with repeat being 20.3 and has now trended down to 14.1. With lactic acid of 2.2 > 1.9.  Also  was hypotensive down to 89 Systolic, with AKI.   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   ?-Patient's urinalysis showed a cloudy appearance with amber-colored urine, moderate hemoglobin, negative ketones, moderate leukocytes, negative nitrites, many bacteria, greater than 50 RBCs per high-power field, greater than 50 WBCs ?-Chest x-ray suggest worsening pneumonia yesterday and also had a pleural effusion which is now been tapped and yielded 240 mL. ?-Follow-up blood culture result and sensitivities as it shows pansensitive E. coli ?-New Urine Cx showing >100,000 CFU of Enterococcus Faecalis with sensitivites pending but ? Colonization as WBC is improving; Regardless patient is on IV Unasyn which will cover given that Enterococcus is Pansensitive   ?-Continued IV cefepime and changed to IV ceftriaxone and this has been de-escalated to IV Unasyn and he is on Flagyl for the Colitis  ?-Prior urine cultures 12/2020 grew Enterobacter cloacae and Enterococcus fa

## 2021-04-17 NOTE — Procedures (Signed)
PreOperative Dx: LEFT pleural effusion ?Postoperative Dx: LEFT pleural effusion ?Procedure:   US guided LEFT thoracentesis ?Radiologist:  Thornton Papas ?Anesthesia:  10 ml of 1% lidocaine ?Specimen:  240 mL of clear yellow colored fluid ?EBL:   < 1 ml ?Complications: None   ?

## 2021-04-17 NOTE — TOC Transition Note (Signed)
Transition of Care (TOC) - CM/SW Discharge Note ? ? ?Patient Details  ?Name: Allen Yu ?MRN: [id number] ?Date of Birth: 1945/09/18 ? ?Transition of Care (TOC) CM/SW Contact:  ?Shade Flood, LCSW ?Phone Number: ?04/17/2021, 3:17 PM ? ? ?Clinical Narrative:    ? ?Pt stable for dc home today per MD. Damaris Schooner with pt's wife to review MD/PT recommendation for [facility] PT at dc. Pt's wife states that pt has been going for outpatient therapy at [facility] outpatient rehab and she prefers to keep doing that. She is aware to contact PCP if she changes her mind and wants pt to have [facility] instead. ? ?There are no other TOC needs for dc. ? ?Final next level of care: Home/Self Care ?Barriers to Discharge: Barriers Resolved ? ? ?Patient Goals and CMS Choice ?  ?  ?  ? ?Discharge Placement ?  ?           ?  ?  ?  ?  ? ?Discharge Plan and Services ?  ?  ?           ?  ?  ?  ?  ?  ?  ?  ?  ?  ?  ? ?Social Determinants of Health (SDOH) Interventions ?  ? ? ?Readmission Risk Interventions ? ?  04/17/2021  ?  3:16 PM 01/25/2021  ?  9:26 AM 01/18/2021  ?  2:24 PM  ?Readmission Risk Prevention Plan  ?Transportation Screening Complete Complete Complete  ?Medication Review Press photographer) Complete Complete Complete  ?PCP or Specialist appointment within 3-5 days of discharge   Complete  ?[place] or Home Care Consult Patient refused Complete Complete  ?SW Recovery Care/Counseling Consult Complete Complete   ?Palliative Care Screening Not Applicable Not Applicable   ?[place] Not Applicable Not Applicable Patient Refused  ? ? ? ? ? ?

## 2021-04-17 NOTE — Progress Notes (Signed)
Patient extremely upset, he did not have phone in room, this Probation officer and Charge RN Stanton Kidney ann got a phone for patient, he still continued to curse at staff to take his " explicit IV out ' Redirection not helpful , administered ativan IV , patient tearful and frustrated that he was going to get to go home. Educated patient that his wife was on the way to hospital and that she wanted him to have procedures so he wouldn't end up back at the hospital , patient verbalized understanding and states " do you yall have to do ". He is more calm after ativan administration , he allowed me to hang his antibiotics .  ?

## 2021-04-17 NOTE — Plan of Care (Signed)
?  Problem: Activity: ?Goal: Ability to tolerate increased activity will improve ?Outcome: Progressing ?  ?Problem: Clinical Measurements: ?Goal: Ability to maintain a body temperature in the normal range will improve ?Outcome: Progressing ?  ?Problem: Clinical Measurements: ?Goal: Ability to maintain clinical measurements within normal limits will improve ?Outcome: Progressing ?Goal: Respiratory complications will improve ?Outcome: Progressing ?  ?Problem: Activity: ?Goal: Risk for activity intolerance will decrease ?Outcome: Progressing ?  ?Problem: Nutrition: ?Goal: Adequate nutrition will be maintained ?Outcome: Progressing ?  ?Problem: Coping: ?Goal: Level of anxiety will decrease ?Outcome: Progressing ?  ?Problem: Safety: ?Goal: Ability to remain free from injury will improve ?Outcome: Progressing ?  ?Problem: Skin Integrity: ?Goal: Risk for impaired skin integrity will decrease ?Outcome: Progressing ?  ?

## 2021-04-17 NOTE — Progress Notes (Addendum)
OT Cancellation Note ? ?Patient Details ?Name: Allen Yu ?MRN: [id number] ?DOB: 1945/05/03 ? ? ?Cancelled Treatment:    Reason Eval/Treat Not Completed: OT screened, no needs identified, will sign off. Pt is functioning at baseline. He receives assistance from his wife for ADL completion.  He uses a manual wheelchair for mobility. When completing functional transfers he using a lateral scoot method. No follow up OT services are recommended at this time. Thank you for the referral. ? ?Ailene Ravel, OTR/L,CBIS  ?[phone] ? ?04/17/2021, 9:32 AM ?

## 2021-04-17 NOTE — Plan of Care (Signed)
?  Problem: Activity: ?Goal: Ability to tolerate increased activity will improve ?Outcome: Progressing ?  ?Problem: Clinical Measurements: ?Goal: Ability to maintain a body temperature in the normal range will improve ?Outcome: Progressing ?  ?Problem: Respiratory: ?Goal: Ability to maintain adequate ventilation will improve ?Outcome: Progressing ?Goal: Ability to maintain a clear airway will improve ?Outcome: Progressing ?  ?Problem: Health Behavior/Discharge Planning: ?Goal: Ability to manage health-related needs will improve ?Outcome: Progressing ?  ?Problem: Clinical Measurements: ?Goal: Ability to maintain clinical measurements within normal limits will improve ?Outcome: Progressing ?Goal: Respiratory complications will improve ?Outcome: Progressing ?  ?Problem: Activity: ?Goal: Risk for activity intolerance will decrease ?Outcome: Progressing ?  ?Problem: Nutrition: ?Goal: Adequate nutrition will be maintained ?Outcome: Progressing ?  ?Problem: Coping: ?Goal: Level of anxiety will decrease ?Outcome: Progressing ?  ?Problem: Pain Managment: ?Goal: General experience of comfort will improve ?Outcome: Progressing ?  ?Problem: Safety: ?Goal: Ability to remain free from injury will improve ?Outcome: Progressing ?  ?Problem: Skin Integrity: ?Goal: Risk for impaired skin integrity will decrease ?Outcome: Progressing ?  ?

## 2021-04-17 NOTE — Progress Notes (Signed)
Patient ID: Allen Yu, male   DOB: 04-27-1945, 76 y.o.   MRN: [id number] ?S: No complaints and "I am going home today" ?O:BP (!) 139/98 (BP Location: Left Arm)   Pulse 92   Temp 98.1 ?F (36.7 ?C) (Oral)   Resp 20   Ht 6' (1.829 m)   Wt 55.6 kg   SpO2 99%   BMI 16.62 kg/m?  ? ?Intake/Output Summary (Last 24 hours) at 04/17/2021 0918 ?Last data filed at 04/17/2021 1423 ?Gross per 24 hour  ?Intake 1737 ml  ?Output 3325 ml  ?Net -1588 ml  ? ?Intake/Output: ?I/O last 3 completed shifts: ?In: 1977 [P.O.:1077; IV [id number]:356] ?Out: 4425 [YSHUO:3729] ? Intake/Output this shift: ? No intake/output data recorded. ?Weight change:  ?Gen: NAD ?CVS: RRR ?Resp: CTA ?Abd: +BS, soft, NT/ND ?Ext: no edema ? ?Recent Labs  ?Lab 04/11/21 ?1311 04/12/21 ?0530 04/13/21 ?0601 04/14/21 ?0500 04/14/21 ?1335 04/15/21 ?0211 04/16/21 ?1552 04/17/21 ?0802  ?NA 128* 135 133* 135 134* 136  136 137 135  ?K 3.6 3.5 3.3* 3.7 3.8 4.1  4.1 3.7 4.0  ?CL 96* 100 101 102 101 108  107 109 107  ?CO2 '22 23 22 23 22 '$ 21*  20* 21* 22  ?GLUCOSE 274* 171* 144* 202* 202* 205*  205* 143* 227*  ?BUN 87* 95* 93* 75* 77* 67*  68* 49* 41*  ?CREATININE 3.15* 3.44* 3.42* 2.96* 2.95* 2.49*  2.48* 1.98* 1.75*  ?ALBUMIN 3.0*  --  2.7* 2.8* 2.9* 2.7*  2.7* 2.6* 2.8*  ?CALCIUM 8.1* 8.9 8.4* 8.7* 8.5* 8.4*  8.5* 8.5* 8.5*  ?PHOS  --   --  3.9 3.3 3.4 3.1  3.1 3.8 3.9  ?AST 23  --  27 25  --  25 34 37  ?ALT 18  --  20 19  --  '19 21 25  '$ ? ?Liver Function Tests: ?Recent Labs  ?Lab 04/15/21 ?2336 04/16/21 ?1224 04/17/21 ?4975  ?AST 25 34 37  ?ALT '19 21 25  '$ ?ALKPHOS 103 91 104  ?BILITOT 0.7 0.6 0.8  ?PROT 5.8* 5.6* 6.0*  ?ALBUMIN 2.7*  2.7* 2.6* 2.8*  ? ?No results for input(s): LIPASE, AMYLASE in the last 168 hours. ?No results for input(s): AMMONIA in the last 168 hours. ?CBC: ?Recent Labs  ?Lab 04/13/21 ?0601 04/14/21 ?0500 04/15/21 ?3005 04/16/21 ?1102 04/17/21 ?1117  ?WBC 14.1* 11.1* 11.4* 12.1* 12.6*  ?NEUTROABS 10.3* 8.4* 8.3* 9.2* 10.1*  ?HGB 7.4* 7.9*  6.9* 8.3* 8.6*  ?HCT 22.4* 24.3* 21.1* 25.5* 25.6*  ?MCV 94.5 97.6 96.3 94.1 93.8  ?PLT 265 313 297 300 344  ? ?Cardiac Enzymes: ?No results for input(s): CKTOTAL, CKMB, CKMBINDEX, TROPONINI in the last 168 hours. ?CBG: ?Recent Labs  ?Lab 04/15/21 ?2144 04/16/21 ?0740 04/16/21 ?1118 04/16/21 ?1628 04/17/21 ?0756  ?GLUCAP 242* 144* 184* 227* 226*  ? ? ?Iron Studies: No results for input(s): IRON, TIBC, TRANSFERRIN, FERRITIN in the last 72 hours. ?Studies/Results: ?DG CHEST PORT 1 VIEW ? ?Result Date: 04/17/2021 ?CLINICAL DATA:  Sob/cough/CKD/diabetic/htn/smoker EXAM: PORTABLE CHEST 1 VIEW COMPARISON:  March 26, 23. FINDINGS: Likely mildly increased left pleural effusion. Similar overlying left-sided opacities. No visible pneumothorax. Enlarged cardiac silhouette. Partially imaged ACDF. IMPRESSION: 1. Likely mildly increased left pleural effusion with similar overlying atelectasis and/or consolidation. 2. Similar cardiomegaly. Electronically Signed   By: Margaretha Sheffield M.D.   On: 04/17/2021 09:09  ? ?DG CHEST PORT 1 VIEW ? ?Result Date: 04/16/2021 ?CLINICAL DATA:  Shortness of breath. EXAM: PORTABLE CHEST 1 VIEW COMPARISON:  04/11/2021 portable chest. FINDINGS: 4:45 a.m., 04/16/2021 cardiomegaly is again noted, and mild central vascular distention without overt edema. There is increasing layering moderate left pleural effusion and worsening consolidation in the left mid and lower lung field with increased opacity now extending to the mid field. Left apex and right lung remain clear. Stable mediastinum with aortic atherosclerosis. Osteopenia and thoracic spondylosis. IMPRESSION: Worsening consolidation and underlying pleural effusion left mid/lower zone. Stable cardiomegaly. Electronically Signed   By: Telford Nab M.D.   On: 04/16/2021 07:09   ? aspirin EC  81 mg Oral Q breakfast  ? atorvastatin  40 mg Oral Daily  ? Chlorhexidine Gluconate Cloth  6 each Topical Daily  ? clopidogrel  75 mg Oral Daily  ? donepezil   10 mg Oral Daily  ? feeding supplement  237 mL Oral TID BM  ? gabapentin  100 mg Oral Daily  ? guaiFENesin  1,200 mg Oral BID  ? heparin  5,000 Units Subcutaneous Q8H  ? insulin aspart  0-5 Units Subcutaneous QHS  ? insulin aspart  0-9 Units Subcutaneous TID WC  ? insulin glargine-yfgn  10 Units Subcutaneous Daily  ? ipratropium  0.5 mg Nebulization Q6H  ? levalbuterol  0.63 mg Nebulization Q6H  ? multivitamin with minerals  1 tablet Oral Daily  ? ? ?BMET ?   ?Component Value Date/Time  ? NA 135 04/17/2021 0836  ? K 4.0 04/17/2021 0836  ? CL 107 04/17/2021 0836  ? CO2 22 04/17/2021 0836  ? GLUCOSE 227 (H) 04/17/2021 0836  ? BUN 41 (H) 04/17/2021 0836  ? CREATININE 1.75 (H) 04/17/2021 0836  ? CREATININE 1.10 11/26/2019 0934  ? CALCIUM 8.5 (L) 04/17/2021 0836  ? GFRNONAA 40 (L) 04/17/2021 0836  ? GFRAA >60 07/10/2019 0702  ? ?CBC ?   ?Component Value Date/Time  ? WBC 12.6 (H) 04/17/2021 0836  ? RBC 2.73 (L) 04/17/2021 0836  ? HGB 8.6 (L) 04/17/2021 0836  ? HCT 25.6 (L) 04/17/2021 0836  ? HCT 31.3 (L) 07/09/2015 0757  ? PLT 344 04/17/2021 0836  ? MCV 93.8 04/17/2021 0836  ? MCH 31.5 04/17/2021 0836  ? MCHC 33.6 04/17/2021 0836  ? RDW 14.2 04/17/2021 0836  ? LYMPHSABS 1.3 04/17/2021 0836  ? MONOABS 0.8 04/17/2021 0836  ? EOSABS 0.2 04/17/2021 0836  ? BASOSABS 0.1 04/17/2021 0836  ? ? ? ?Assessment/Plan: ? ?AKI/CKD stage IIIb - in the setting of bacteremia and Bactrim use.  Improving and back to baseline.  Avoid NSAIDs, Bactrim, and IV contrast.  Follow up with his PCP.  Not a dialysis candidate given dementia and poor functional status.  Nothing further to add and will sign off.  Please call with questions or concerns.  ?E Coli bacteremia due to UTI - abx per primary svc ?CHF - stable and appears euvolemic.   ?Dementia ?Anemia of CKD - stable ? ?Donetta Potts, MD ?[facility] ? ? ?

## 2021-04-18 LAB — MISC LABCORP TEST (SEND OUT): Labcorp test code: 9985

## 2021-04-18 LAB — AMYLASE, BODY FLUID (OTHER): Amylase, Body Fluid: 41 U/L

## 2021-04-19 LAB — PATHOLOGIST SMEAR REVIEW

## 2021-04-20 ENCOUNTER — Other Ambulatory Visit (HOSPITAL_COMMUNITY): Payer: Self-pay | Admitting: Internal Medicine

## 2021-04-20 DIAGNOSIS — J189 Pneumonia, unspecified organism: Secondary | ICD-10-CM | POA: Diagnosis not present

## 2021-04-20 DIAGNOSIS — J90 Pleural effusion, not elsewhere classified: Secondary | ICD-10-CM

## 2021-04-20 DIAGNOSIS — K529 Noninfective gastroenteritis and colitis, unspecified: Secondary | ICD-10-CM | POA: Diagnosis not present

## 2021-04-20 DIAGNOSIS — N178 Other acute kidney failure: Secondary | ICD-10-CM | POA: Diagnosis not present

## 2021-04-20 DIAGNOSIS — B9620 Unspecified Escherichia coli [E. coli] as the cause of diseases classified elsewhere: Secondary | ICD-10-CM | POA: Diagnosis not present

## 2021-04-21 ENCOUNTER — Other Ambulatory Visit: Payer: Self-pay | Admitting: *Deleted

## 2021-04-21 ENCOUNTER — Encounter: Payer: Self-pay | Admitting: *Deleted

## 2021-04-21 NOTE — Patient Outreach (Signed)
[facility]) Care Management ?Telephonic RN Care Manager Note ? ? ?04/21/2021 ?Name:  BROADUS COSTILLA MRN:  [id number] DOB:  1945-05-21 ? ?Summary: ?Car coordination for pending home care aide replacement and follow up for hospital discharge ? ? ?Home care aide ?RN CM receive an incoming voice message left by pt's wife on 04/21/21 stating the home care aide supplied by [place] will soon be resigning and assist will be needed to get a new one.  She is requesting assistance ? ?Mrs Chiara returned a call to RN CM after she received a call from [place] ?They agreed to send a consult for new aide services closer to the time the present aide is scheduled to leave ?They will coordinate for the new aide to shadow the present one. ? ?Post hospital ?Mrs Bussey confirms discharge instructions read and followed  ?She voices concern with Mr Stanke having about "an inch of his penis" irritated from the foley and the recurrent urinary tract infections (UTIs) + this recent one leading to pneumonia ?Mrs Humm reports Mr Preziosi is frequently "slinging the bag on the bed before he stands up to get in" ?This leads to pulling and bleeding  ? ?Recent hospital discharge for sepsis Hospital Transition of care services noted to be completed by primary care MD office staff- Dr Willey Blade ?Transition of Care will be completed by primary care provider office who will refer to [facility] care management if needed. ? ?Mrs Bearman voiced appreciation for the suggestions with foley care/safety and care aide  ? ?Recommendations/Changes made from today's visit: ?Received wife's voice message ?Reviewed EPIC care everywhere, found number for [facility] RN CM Charlean Merl [phone] ?Outreach to F. Elvina Sidle to update her on wife voice message and aide. Wife to be called  ? ?RN CM discussed the risks of foley catheters to include recurrent UTIs  ?RN CM discussed Supra pubic catheters, condom catheters (but only if  pt is not having urinary retention)  ?Wife was encouraged to speak with the urology staff about these options ? ?RN CM offered suggestions to keep the foley catheter bag secure to Mr Seabury's thigh to prevent pulling, etc ?Garter belts or thigh hose to secure bag to his thigh, a bigger foley bag to decrease having to empty frequently   ? ?Discussed THN progression and future outreach  ? ? ?Subjective: ?ONEIL BEHNEY is an 76 y.o. year old male who is a primary patient of [name], MD. The care management team was consulted for assistance with care management and/or care coordination needs.   ? ?Telephonic RN Care Manager completed Telephone Visit today.  ? ?Objective: ? ?Medications Reviewed Today   ? ? Reviewed by Hedwig Morton, RN (Registered Nurse) on 04/17/21 at [place] List Status: Complete  ? ?Medication Order Taking? Sig Documenting Provider Last Dose Status Informant  ?acetaminophen (TYLENOL) 500 MG tablet [id number] Yes Take 500 mg by mouth 2 (two) times daily as needed for moderate pain. [provider] unknown Active Spouse/Significant Other, Pharmacy Records  ?Alcohol Swabs (ALCOHOL PADS) 70 % PADS [id number] Yes USE 1 PAD AS DIRECTED [provider] unknown Active Spouse/Significant Other, Pharmacy Records  ?aspirin EC 81 MG tablet [id number] Yes Take 1 tablet (81 mg total) by mouth daily with breakfast. Roxan Hockey, MD 04/11/2021 Active Spouse/Significant Other, Pharmacy Records  ?atorvastatin (LIPITOR) 40 MG tablet [id number] Yes Take 40 mg by mouth daily. [provider] 04/11/2021 Active  Spouse/Significant Other, Pharmacy Records  ?B-D UF III MINI PEN NEEDLES 31G X 5 MM MISC [id number]  SMARTSIG:1 Each SUB-Q Daily [provider]  Active Spouse/Significant Other, Pharmacy Records  ?clopidogrel (PLAVIX) 75 MG tablet [id number] Yes Take 1 tablet (75 mg total) by mouth daily. Roxan Hockey, MD 04/11/2021 [phone] Active Spouse/Significant Other, Pharmacy Records   ?Dextromethorphan-GG-APAP (CORICIDIN HBP COLD/COUGH/FLU) 10-200-325 MG/15ML LIQD [id number] Yes Take 30 mLs by mouth as needed (cough). [provider] Past Week Active Spouse/Significant Other, Pharmacy Records  ?donepezil (ARICEPT) 10 MG tablet [id number] Yes Take 10 mg by mouth daily. [provider] 04/11/2021 Active Spouse/Significant Other, Pharmacy Records  ?feeding supplement, ENSURE COMPLETE, (ENSURE COMPLETE) LIQD [id number] Yes Take 237 mLs by mouth 2 (two) times daily between meals. Roxan Hockey, MD unknown Active Spouse/Significant Other, Pharmacy Records  ?ferrous sulfate 325 (65 FE) MG tablet [id number] Yes Take 325 mg by mouth daily with breakfast. [provider] 04/11/2021 Active Spouse/Significant Other, Pharmacy Records  ?gabapentin (NEURONTIN) 100 MG capsule [id number] Yes Take 100 mg by mouth daily. [provider] 04/11/2021 Active Spouse/Significant Other, Pharmacy Records  ?LANTUS 100 UNIT/ML injection [id number] Yes Inject 0.06 mLs (6 Units total) into the skin at bedtime as needed (High blood glucose). If Blood glucose over 200  ?Patient taking differently: Inject 25-30 Units into the skin daily as needed (High blood glucose). If Blood glucose over 100-300 25units and if blood sugar is over 300 30units  ? Roxan Hockey, MD 04/11/2021 Active Spouse/Significant Other, Pharmacy Records  ?losartan (COZAAR) 50 MG tablet [id number] Yes Take 1 tablet (50 mg total) by mouth daily.  ?Patient taking differently: Take 100 mg by mouth daily.  Waynetta Pean 04/11/2021 Active Spouse/Significant Other, Pharmacy Records  ?metoprolol succinate (TOPROL-XL) 50 MG 24 hr tablet [id number] Yes Take 1 tablet (50 mg total) by mouth daily. Take with or immediately following a meal. Erma Heritage, PA-C 04/11/2021 0730 Expired 04/11/21 2359 Spouse/Significant Other, Pharmacy Records  ?mirtazapine (REMERON) 15 MG tablet [id number] No Take 0.5 tablets (7.5 mg total) by  mouth at bedtime.  ?Patient not taking: Reported on 04/10/2021  ? Roxan Hockey, MD Not Taking Active Spouse/Significant Other, Pharmacy Records  ?Multiple Vitamins-Minerals (MULTIVITAMIN WITH MINERALS) tablet [id number] Yes Take 1 tablet by mouth daily. Roxan Hockey, MD 04/11/2021 Active Spouse/Significant Other, Pharmacy Records  ?omeprazole (PRILOSEC) 20 MG capsule [id number] Yes Take 1 capsule by mouth daily. [provider] 04/11/2021 Active Spouse/Significant Other, Pharmacy Records  ?ondansetron (ZOFRAN) 4 MG tablet [id number] Yes Take 1 tablet (4 mg total) by mouth every 6 (six) hours as needed for nausea. Roxan Hockey, MD unknown Active Spouse/Significant Other, Pharmacy Records  ?risperiDONE (RISPERDAL) 0.5 MG tablet [id number] Yes Take 1 tablet (0.5 mg total) by mouth every 12 (twelve) hours as needed (Restlessness and agitation). Roxan Hockey, MD Past Week Active Spouse/Significant Other, Pharmacy Records  ?sulfamethoxazole-trimethoprim (BACTRIM DS) 800-160 MG tablet [id number] Yes Take 1 tablet by mouth 2 (two) times daily for 7 days. Luna Fuse, MD 04/11/2021 Active Pharmacy Records, Spouse/Significant Other  ?torsemide (DEMADEX) 20 MG tablet [id number] Yes Take 1.5 tablets (30 mg total) by mouth daily.  ?Patient taking differently: Take 10 mg by mouth daily. Take 10 mg daily  ? Barton Dubois, MD 04/11/2021 Active Spouse/Significant Other, Pharmacy Records  ?traMADol (ULTRAM) 50 MG tablet [id number] Yes Take 50 mg by mouth every 6 (six) hours as needed for moderate pain. [provider] Past Month Active Spouse/Significant Other, Pharmacy Records  ?Vitamin D, Ergocalciferol, (  DRISDOL) 1.25 MG (50000 UNIT) CAPS capsule [id number] Yes Take 50,000 Units by mouth every Friday. [provider] 04/07/2021 Active Spouse/Significant Other, Pharmacy Records  ?Med List Note Jolayne Haines, CPhT 07/09/16 0920): Dr. [name] [phone]  ? ?  ?  ? ?  ? ? ? ?SDOH:  (Social  Determinants of Health) assessments and interventions performed:  ?SDOH Interventions   ? ?Flowsheet Row Most Recent Value  ?SDOH Interventions   ?Food Insecurity Interventions Intervention Not Indicate

## 2021-04-22 LAB — CULTURE, BODY FLUID W GRAM STAIN -BOTTLE
Culture: NO GROWTH
Special Requests: ADEQUATE

## 2021-04-24 ENCOUNTER — Encounter (HOSPITAL_COMMUNITY): Payer: TRICARE For Life (TFL) | Admitting: Physical Therapy

## 2021-04-27 DIAGNOSIS — M62271 Nontraumatic ischemic infarction of muscle, right ankle and foot: Secondary | ICD-10-CM | POA: Diagnosis not present

## 2021-05-01 ENCOUNTER — Ambulatory Visit (HOSPITAL_COMMUNITY): Payer: Medicare Other | Attending: Surgical

## 2021-05-01 DIAGNOSIS — R2681 Unsteadiness on feet: Secondary | ICD-10-CM | POA: Insufficient documentation

## 2021-05-01 DIAGNOSIS — R262 Difficulty in walking, not elsewhere classified: Secondary | ICD-10-CM | POA: Insufficient documentation

## 2021-05-01 DIAGNOSIS — M6281 Muscle weakness (generalized): Secondary | ICD-10-CM | POA: Insufficient documentation

## 2021-05-01 NOTE — Progress Notes (Signed)
?Office Note  ? ? ? ?CC:  follow up ?Requesting Provider:  [name], MD ? ?HPI: Allen Yu is a 76 y.o. (February 09, 1945) male who presents for follow up of PAD. He has concerns today of recurrent wounds of right leg. He has history of previous intervention on 10/27/2020 he underwent aortogram with right peroneal and TP trunk angioplasty, right SFA and AK popliteal angioplasty by Dr. Carlis Abbott.  This was performed secondary to right LE rest pain with dorsal blistering.  He was last seen in February, at which time his wounds were healed. His non invasive studies showed significant tibial disease with ABI of .54, TBI of 0. Arterial duplex showed monophasic flow throughout RLE with single vessel peroneal runoff. He and his wife elected to follow up as needed.  ? ?He now presents today with is daughter with concerns about recurrent R foot wounds. His PCP, Dr. Willey Blade, called last week with concerns about the wounds on his toes. These have been present now for approximately 2 weeks. He was recently hospitalized and was discharged on 04/17/21. His daughter says the blisters appeared after he got home. Some have healed but she says when they kept appearing she because concerned and addressed it as his PCP appointment last week.He has had increased foot swelling since discharge. He is unable to really tolerate elevating his foot as it increases his pain. Patient says he has pain in the entire right leg constantly. He explains that the pain is worse in the right thigh. He says the pain wakes him up at night. He is not ambulatory and relies on a WC for mobility.  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 Donnetta Hutching.  He was going to PT prior to his hospitalization. Was working on Standing on the right leg. His daughter reports that he now has brace for his foot due to foot drop.  ? ?The pt is on a statin for cholesterol management.  ?The pt is on a daily aspirin.   Other AC:  Plavix ?The pt is on ARB, BB for hypertension.    ?The pt is diabetic.  ?Tobacco hx:  current, 1/2 ppd ? ?Past Medical History:  ?Diagnosis Date  ? Anemia   ? Arthritis   ? Carotid stenosis   ? Chronic back pain   ? Chronic HFrEF (heart failure with reduced ejection fraction) ([place])   ? a. 09/2020 Echo: EF 45%, mild LVH; b. 12/2020 Echo: EF 25-30%, glob HK, mod LVH, mildly reduced RV fxn, RVSP 65.41mHg, mod BAE. Triv effusion. Mod MR/TR.  ? CKD (chronic kidney disease), stage III ([place]   ? Constipation   ? Dementia ([place]   ? Diabetes mellitus   ? Dilated cardiomyopathy ([place]   ? a. 09/2020 Echo: EF 45%; b. 12/2020 Echo: EF 25-30%.  ? GERD (gastroesophageal reflux disease)   ? History of kidney stones   ? Hypertension   ? Lung nodule   ? PAF (paroxysmal atrial fibrillation) ([place]   ? Peripheral neuropathy   ? Peripheral vascular disease ([place]   ? a. 06/2020 s/p L AKA; b. 10/2020 s/p R SFA and above-knee popliteal PTA.  ? PTSD (post-traumatic stress disorder)   ? Schizophrenia ([place] [place]   ? Tuberculosis   ? Treated  ? ? ?Past Surgical History:  ?Procedure Laterality Date  ? ABDOMINAL AORTOGRAM W/LOWER EXTREMITY N/A 05/20/2020  ? Procedure: ABDOMINAL AORTOGRAM W/LOWER EXTREMITY;  Surgeon: FElam Dutch MD;  [facility];  Service:  Cardiovascular;  Laterality: N/A;  ? ABDOMINAL AORTOGRAM W/LOWER EXTREMITY N/A 10/27/2020  ? Procedure: ABDOMINAL AORTOGRAM W/LOWER EXTREMITY;  Surgeon: Marty Heck, MD;  Location: [place] [facility];  Service: Cardiovascular;  Laterality: N/A;  ? AMPUTATION Left 06/28/2020  ? Procedure: AMPUTATION ABOVE KNEE LEFT;  Surgeon: Rosetta Posner, MD;  [facility];  Service: Vascular;  Laterality: Left;  ? APPENDECTOMY    ? BACK SURGERY  2000, 2013  ? x2  ? BLADDER SURGERY    ? 02  ? CHOLECYSTECTOMY    ? COLONOSCOPY    ? COLONOSCOPY N/A 12/07/2014  ? Procedure: COLONOSCOPY;  Surgeon: Daneil Dolin, MD;  [facility];  Service: Endoscopy;  Laterality: N/A;  10:30 Am  ? EYE SURGERY Bilateral   ? removed metal from  eye  ? FEMORAL-TIBIAL BYPASS GRAFT Left 05/27/2020  ? Procedure: LEFT FEMORAL TO PERONEAL ARTERY BYPASS;  Surgeon: Rosetta Posner, MD;  Location: [facility];  Service: Vascular;  Laterality: Left;  ? HERNIA REPAIR Right   ? INGUINAL HERNIA REPAIR Left 11/03/2013  ? Procedure: HERNIA REPAIR INGUINAL ADULT;  Surgeon: Gayland Curry, MD;  Location: [facility];  Service: General;  Laterality: Left;  ? PERIPHERAL VASCULAR INTERVENTION  10/27/2020  ? Procedure: PERIPHERAL VASCULAR INTERVENTION;  Surgeon: Marty Heck, MD;  Location: [place] [facility];  Service: Cardiovascular;;  ? SHOULDER SURGERY    ? RIGHT SHOULDER   ? VIDEO BRONCHOSCOPY WITH ENDOBRONCHIAL NAVIGATION N/A 07/10/2019  ? Procedure: VIDEO BRONCHOSCOPY WITH ENDOBRONCHIAL NAVIGATION;  Surgeon: Melrose Nakayama, MD;  Location: [place] [facility];  Service: Thoracic;  Laterality: N/A;  ? ? ?Social History  ? ?Socioeconomic History  ? Marital status: Married  ?  Spouse name: Allen Yu  ? Number of children: Not on file  ? Years of education: Not on file  ? Highest education level: Not on file  ?Occupational History  ? Not on file  ?Tobacco Use  ? Smoking status: Every Day  ?  Packs/day: 0.50  ?  Types: Cigarettes  ? Smokeless tobacco: Never  ? Tobacco comments:  ?  burns them up  ?Vaping Use  ? Vaping Use: Never used  ?Substance and Sexual Activity  ? Alcohol use: No  ?  Alcohol/week: 0.0 standard drinks  ? Drug use: Yes  ?  Types: Marijuana  ?  Comment: almost daily for pain  ? Sexual activity: Never  ?Other Topics Concern  ? Not on file  ?Social History Narrative  ? Not on file  ? ?Social Determinants of Health  ? ?Financial Resource Strain: Low Risk   ? Difficulty of Paying Living Expenses: Not hard at all  ?Food Insecurity: No Food Insecurity  ? Worried About Charity fundraiser in the Last Year: Never true  ? Ran Out of Food in the Last Year: Never true  ?Transportation Needs: No Transportation Needs  ? Lack of Transportation (Medical): No  ? Lack of Transportation  (Non-Medical): No  ?Physical Activity: Not on file  ?Stress: No Stress Concern Present  ? Feeling of Stress : Only a little  ?Social Connections: Socially Integrated  ? Frequency of Communication with Friends and Family: Twice a week  ? Frequency of Social Gatherings with Friends and Family: Twice a week  ? Attends Religious Services: 1 to 4 times per year  ? Active Member of Clubs or Organizations: Yes  ? Attends Archivist Meetings: 1 to 4 times per year  ? Marital Status:  Married  ?Intimate Partner Violence: Not At Risk  ? Fear of Current or Ex-Partner: No  ? Emotionally Abused: No  ? Physically Abused: No  ? Sexually Abused: No  ? ? ?Family History  ?Problem Relation Age of Onset  ? Heart disease Mother   ?     before age 64  ? ? ?Current Outpatient Medications  ?Medication Sig Dispense Refill  ? acetaminophen (TYLENOL) 500 MG tablet Take 500 mg by mouth 2 (two) times daily as needed for moderate pain.    ? Alcohol Swabs (ALCOHOL PADS) 70 % PADS USE 1 PAD AS DIRECTED    ? aspirin EC 81 MG tablet Take 1 tablet (81 mg total) by mouth daily with breakfast. 30 tablet 2  ? atorvastatin (LIPITOR) 40 MG tablet Take 40 mg by mouth daily.    ? B-D UF III MINI PEN NEEDLES 31G X 5 MM MISC SMARTSIG:1 Each SUB-Q Daily    ? clopidogrel (PLAVIX) 75 MG tablet Take 1 tablet (75 mg total) by mouth daily. 30 tablet 11  ? Dextromethorphan-GG-APAP (CORICIDIN HBP COLD/COUGH/FLU) 10-200-325 MG/15ML LIQD Take 30 mLs by mouth as needed (cough).    ? donepezil (ARICEPT) 10 MG tablet Take 10 mg by mouth daily.    ? feeding supplement, ENSURE COMPLETE, (ENSURE COMPLETE) LIQD Take 237 mLs by mouth 2 (two) times daily between meals. 23700 mL 1  ? ferrous sulfate 325 (65 FE) MG tablet Take 325 mg by mouth daily with breakfast.    ? gabapentin (NEURONTIN) 100 MG capsule Take 100 mg by mouth daily.    ? LANTUS 100 UNIT/ML injection Inject 0.06 mLs (6 Units total) into the skin at bedtime as needed (High blood glucose). If Blood  glucose over 200 (Patient taking differently: Inject 25-30 Units into the skin daily as needed (High blood glucose). If Blood glucose over 100-300 25units and if blood sugar is over 300 30units) 10 mL 11  ? losartan (

## 2021-05-01 NOTE — Therapy (Signed)
OUTPATIENT PHYSICAL THERAPY PROGRESS NOTE   Patient Name: Allen Yu MRN: [id number] DOB:[birth year], 76 y.o., male Today's Date: 05/01/2021  PCP: [name], MD REFERRING PROVIDER: Addison Bailey, MD   Progress Note Reporting Period 01/26/21 to 05/01/21  See note below for Objective Data and Assessment of Progress/Goals.       PT End of Session - 05/01/21 1306     Visit Number 12    Number of Visits 15    Date for PT Re-Evaluation 06/16/21    Authorization Type Medicare Part A and B; T[employer] for Life    PT Start Time 1304    PT Stop Time 1345    PT Time Calculation (min) 41 min    Activity Tolerance Patient tolerated treatment well;Patient limited by fatigue              Past Medical History:  Diagnosis Date   Anemia    Arthritis    Carotid stenosis    Chronic back pain    Chronic HFrEF (heart failure with reduced ejection fraction) (HCC)    a. 09/2020 Echo: EF 45%, mild LVH; b. 12/2020 Echo: EF 25-30%, glob HK, mod LVH, mildly reduced RV fxn, RVSP 65.72mmHg, mod BAE. Triv effusion. Mod MR/TR.   CKD (chronic kidney disease), stage III (HCC)    Constipation    Dementia (HCC)    Diabetes mellitus    Dilated cardiomyopathy (HCC)    a. 09/2020 Echo: EF 45%; b. 12/2020 Echo: EF 25-30%.   GERD (gastroesophageal reflux disease)    History of kidney stones    Hypertension    Lung nodule    PAF (paroxysmal atrial fibrillation) (HCC)    Peripheral neuropathy    Peripheral vascular disease (HCC)    a. 06/2020 s/p L AKA; b. 10/2020 s/p R SFA and above-knee popliteal PTA.   PTSD (post-traumatic stress disorder)    Schizophrenia (HCC)    Tuberculosis    Treated   Past Surgical History:  Procedure Laterality Date   ABDOMINAL AORTOGRAM W/LOWER EXTREMITY N/A 05/20/2020   Procedure: ABDOMINAL AORTOGRAM W/LOWER EXTREMITY;  Surgeon: Sherren Kerns, MD;  [facility];  Service: Cardiovascular;  Laterality: N/A;   ABDOMINAL AORTOGRAM W/LOWER EXTREMITY N/A  10/27/2020   Procedure: ABDOMINAL AORTOGRAM W/LOWER EXTREMITY;  Surgeon: Cephus Shelling, MD;  [facility];  Service: Cardiovascular;  Laterality: N/A;   AMPUTATION Left 06/28/2020   Procedure: AMPUTATION ABOVE KNEE LEFT;  Surgeon: Larina Earthly, MD;  Location: [facility] OR;  Service: Vascular;  Laterality: Left;   APPENDECTOMY     BACK SURGERY  2000, 2013   x2   BLADDER SURGERY     02   CHOLECYSTECTOMY     COLONOSCOPY     COLONOSCOPY N/A 12/07/2014   Procedure: COLONOSCOPY;  Surgeon: Corbin Ade, MD;  [facility];  Service: Endoscopy;  Laterality: N/A;  10:30 Am   EYE SURGERY Bilateral    removed metal from eye   FEMORAL-TIBIAL BYPASS GRAFT Left 05/27/2020   Procedure: LEFT FEMORAL TO PERONEAL ARTERY BYPASS;  Surgeon: Larina Earthly, MD;  Location: [facility];  Service: Vascular;  Laterality: Left;   HERNIA REPAIR Right    INGUINAL HERNIA REPAIR Left 11/03/2013   Procedure: HERNIA REPAIR INGUINAL ADULT;  Surgeon: Atilano Ina, MD;  Location: [facility] OR;  Service: General;  Laterality: Left;   PERIPHERAL VASCULAR INTERVENTION  10/27/2020   Procedure: PERIPHERAL VASCULAR INTERVENTION;  Surgeon: Cephus Shelling, MD;  [facility];  Service: Cardiovascular;;   SHOULDER SURGERY     RIGHT SHOULDER    VIDEO BRONCHOSCOPY WITH ENDOBRONCHIAL NAVIGATION N/A 07/10/2019   Procedure: VIDEO BRONCHOSCOPY WITH ENDOBRONCHIAL NAVIGATION;  Surgeon: Loreli Slot, MD;  Location: [facility] OR;  Service: Thoracic;  Laterality: N/A;   Patient Active Problem List   Diagnosis Date Noted   Hypokalemia 04/13/2021   Colitis 04/12/2021   Bacteremia 04/11/2021   Pneumonia 04/11/2021   Chronic systolic CHF (congestive heart failure) (HCC) 04/11/2021   Acute respiratory failure with hypoxia and hypercarbia (HCC) 01/23/2021   CKD (chronic kidney disease) stage 3, GFR 30-59 ml/min (HCC) 01/23/2021   Acute on chronic systolic CHF (congestive heart failure) (HCC) 01/23/2021    Acute respiratory failure with hypoxia (HCC) 01/13/2021   Encounter for fitting and adjustment of hearing aid 12/21/2020   Other specified problems related to psychosocial circumstances 11/21/2020   Sensorineural hearing loss, bilateral 11/21/2020   Tobacco dependence 10/06/2020   Benign prostatic hyperplasia with urinary obstruction 10/06/2020   Above knee amputation of left lower extremity (HCC) 10/06/2020   Other specified counseling 10/06/2020   Severe sepsis (HCC) 09/24/2020   Schizophrenia, unspecified (HCC) 09/24/2020   Unspecified dementia, unspecified severity, without behavioral disturbance, psychotic disturbance, mood disturbance, and anxiety (HCC) 09/24/2020   Lung nodule 09/24/2020   Indwelling Foley catheter present 09/24/2020   Peripheral vascular disease (HCC) 09/24/2020   Mass of testicle 09/24/2020   Problem related to unspecified psychosocial circumstances 08/01/2020   Hypermetropia 07/22/2020   Unspecified atrial fibrillation (HCC) 07/22/2020   Klebsiella sepsis (HCC) 07/22/2020   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   SIRS (systemic inflammatory response syndrome) (HCC) 07/21/2020   Acute metabolic encephalopathy 07/21/2020   Hyponatremia    S/P AKA (above knee amputation) unilateral, left (HCC) 06/28/2020   Gangrene of left foot (HCC) 06/28/2020   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 (HCC) 05/27/2020   Critical lower limb ischemia (HCC) 05/21/2020   Malnutrition (HCC) 05/19/2020   Plantar callus 12/24/2019   Cavitary pneumonia 09/14/2019   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   Unintentional weight loss 09/14/2019   Coronary artery calcification seen on CT scan 10/28/2018   Hilar adenopathy 04/10/2016   Lung nodule seen on imaging study 07/09/2015   Acute encephalopathy    Hypoglycemia due to insulin    Type 2 diabetes mellitus with other circulatory complications (HCC) 07/08/2015   Acute kidney injury superimposed on chronic kidney  disease (HCC) 07/08/2015   Tobacco use disorder 07/08/2015   Diverticulosis of colon without hemorrhage    Left inguinal hernia 09/16/2013   Altered mental status 11/17/2012   Hypothermia 11/17/2012   Leukocytosis 11/17/2012   Chronic kidney disease, stage 3 unspecified (HCC)    Post-traumatic stress disorder, chronic    GERD (gastroesophageal reflux disease)    Essential (primary) hypertension 09/10/2011   Anemia 09/10/2011   Diabetic neuropathy (HCC) 09/10/2011   DDD (degenerative disc disease), lumbar 09/10/2011   Psychosis (HCC) 09/10/2011   Diabetes mellitus type 2, uncontrolled 09/10/2011   Hyperlipemia 09/10/2011   Microalbuminuria 09/10/2011    REFERRING DIAG: Gen. weakness' left AKA  THERAPY DIAG:  Muscle weakness (generalized) - Plan: PT plan of care cert/re-cert  Difficulty in walking, not elsewhere classified - Plan: PT plan of care cert/re-cert  Unsteadiness on feet - Plan: PT plan of care cert/re-cert  PERTINENT HISTORY: Lt AKA   PRECAUTIONS: Falls  SUBJECTIVE: Patient hospitalized from 3/22 to 3/27 for UTI.  Also anemic.  Patient has since developed swelling and blisters on the Right foot. Seeing Vein and Vascular tomorrow. Supposed to get diabetic shoe and AFO on 04/28/21  PAIN:  Are you having pain? Yes: NPRS scale: 10/10 Pain location: "All over", Right foot hurts the worst Pain description: chronic general pain  Aggravating factors: Unsure Relieving factors: Unsure, gabapentin per wife     TODAY'S TREATMENT:  05/01/21 Re-eval today  Bilat UE MMTs shoulder Flexion 4-/5       Bicep 4-/5        Tricep 4-/5     Lower extremity MMTs Right hip Flexion 4-/5        Right hip extension 3+/5        Right knee extension 3+/5   **did not test ankle strength due to wounds Right foot and lower leg  WC to mat transfer with SBA, mat to WC with SBA  Review of HEP     04/03/21 Nu step Lv3 8 min seat 12  LAQ 5lb 3 x10 Seated march 5lb 3x10 Seated  hamstring curl BTB 3 x 10  Sit stand 2 x 5 with min Guard from elevated mat using RW   PATIENT EDUCATION: Education details: on exercise form and function and updated HEP  Person educated: Patient and Spouse Education method: Medical illustrator Education comprehension: verbalized understanding and returned demonstration   HOME EXERCISE PROGRAM: left hip flexor stretch (Thomas test position), hip extension isometric, RLE SLR; 19 - SAQ, LAQ 1/30 band rows seated 2/6 prone laying hip flexor stretch 2/13 hamstring curls Laq, seated hamstring curl, sit to stand with support    PT Short Term Goals - 03/27/21 1332       PT SHORT TERM GOAL #1   Title Demonstrate bed mobility with modified independence to reduce level of assistance    Baseline Current SBA 05/01/21   Time 6    Period Weeks    Status On-going    Target Date 05/22/21     PT SHORT TERM GOAL #2   Title Sit to stand transfer with min A with RW to improve standing tolerance    Baseline min-mod A with HHA using RW, did not attempt today 05/01/21 due to sores Right Lower extremity   Time 6    Period Weeks    Status On-going    Target Date 05/22/21      PT SHORT TERM GOAL #3   Title Squat-pivot transfer min A to improve safety with maneuvering in small spaces    Baseline min-mod A    Time 6    Period Weeks    Status On-going    Target Date 03/09/21      PT SHORT TERM GOAL #4   Title Demo RLE 4/5 to prepare for gait/transfers    Baseline see MMT    Time 6    Period Weeks    Status On-going    Target Date 03/09/21              PT Long Term Goals - 03/27/21 1333       PT LONG TERM GOAL #1   Title Ambulate x 25 ft with RW with/without prosthetic to improve gait tolerance    Baseline Unable, has not been fit for prosthesis    Time 12    Period Weeks    Status On-going    Target Date 06/16/21     PT LONG TERM GOAL #2   Title Stand-pivot transfer with supervision to increase functional  independence     Baseline min-mod A    Time 12    Period Weeks    Status On-going    Target Date 06/16/21              Plan - 03/27/21 1348       Clinical Impression Statement Patient has missed several appointments due to hospitalization for UTI and has also developed several blisters on his Right foot; that foot is also swollen. He has an appointment with vascular doctor tomorrow. Patient with some weakness  from recent hospital stay.  He has made improvements with his transfers and strength (although weakness today after hospital stay)  Patient will continue to benefit from skilled therapy services to reduce remaining deficits and improve functional ability.      Personal Factors and Comorbidities Age;Comorbidity 3+;Time since onset of injury/illness/exacerbation;Fitness     Comorbidities dementia, CKD, DM     Examination-Activity Limitations Bed Mobility;Lift;Toileting;Stand;Reach Overhead;Locomotion Level;Transfers     Stability/Clinical Decision Making Evolving/Moderate complexity     Rehab Potential Fair     PT Frequency 1x / week     PT Duration 12 weeks     PT Treatment/Interventions ADLs/Self Care Home Management;DME Instruction;Gait training;Functional mobility training;Therapeutic activities;Therapeutic exercise;Balance training;Prosthetic Training;Patient/family education;Neuromuscular re-education;Wheelchair mobility training;Manual techniques;Taping     PT Next Visit Plan Awaiting new order from MD; then Progress general strength and condition for functional mobility and transfers; monitor patients Right foot blisters and swelling.          3:06 PM, 05/01/21 Gery Sabedra Small Caidance Sybert MPT [facility] [place] [phone] [phone]

## 2021-05-02 ENCOUNTER — Ambulatory Visit (INDEPENDENT_AMBULATORY_CARE_PROVIDER_SITE_OTHER): Payer: Medicare Other | Admitting: Physician Assistant

## 2021-05-02 ENCOUNTER — Encounter: Payer: Self-pay | Admitting: Physician Assistant

## 2021-05-02 VITALS — BP 133/73 | HR 81 | Temp 97.9°F | Resp 18 | Ht 73.0 in | Wt 120.0 lb

## 2021-05-02 DIAGNOSIS — Z89612 Acquired absence of left leg above knee: Secondary | ICD-10-CM | POA: Diagnosis not present

## 2021-05-02 DIAGNOSIS — I739 Peripheral vascular disease, unspecified: Secondary | ICD-10-CM | POA: Diagnosis not present

## 2021-05-04 DIAGNOSIS — H2521 Age-related cataract, morgagnian type, right eye: Secondary | ICD-10-CM | POA: Diagnosis not present

## 2021-05-08 ENCOUNTER — Encounter: Payer: Self-pay | Admitting: Student

## 2021-05-08 ENCOUNTER — Ambulatory Visit (HOSPITAL_COMMUNITY): Payer: Medicare Other | Admitting: Physical Therapy

## 2021-05-08 ENCOUNTER — Encounter (HOSPITAL_COMMUNITY)
Admission: RE | Admit: 2021-05-08 | Discharge: 2021-05-08 | Disposition: A | Discharge status: Home / Self Care | Payer: Medicare Other | Source: Ambulatory Visit | Attending: Ophthalmology | Admitting: Ophthalmology

## 2021-05-08 ENCOUNTER — Encounter (HOSPITAL_COMMUNITY): Payer: Self-pay

## 2021-05-08 ENCOUNTER — Other Ambulatory Visit: Payer: Self-pay

## 2021-05-08 DIAGNOSIS — I5022 Chronic systolic (congestive) heart failure: Secondary | ICD-10-CM | POA: Diagnosis not present

## 2021-05-08 DIAGNOSIS — N1832 Chronic kidney disease, stage 3b: Secondary | ICD-10-CM | POA: Diagnosis not present

## 2021-05-08 DIAGNOSIS — Z79899 Other long term (current) drug therapy: Secondary | ICD-10-CM | POA: Diagnosis not present

## 2021-05-08 DIAGNOSIS — E875 Hyperkalemia: Secondary | ICD-10-CM | POA: Diagnosis not present

## 2021-05-08 DIAGNOSIS — D630 Anemia in neoplastic disease: Secondary | ICD-10-CM | POA: Diagnosis not present

## 2021-05-08 NOTE — Progress Notes (Signed)
?   05/08/21 1139  ?OBSTRUCTIVE SLEEP APNEA  ?Have you ever been diagnosed with sleep apnea through a sleep study? No  ?Do you snore loudly (loud enough to be heard through closed doors)?  1  ?Do you often feel tired, fatigued, or sleepy during the daytime (such as falling asleep during driving or talking to someone)? 0  ?Has anyone observed you stop breathing during your sleep? 0  ?Do you have, or are you being treated for high blood pressure? 1  ?BMI more than 35 kg/m2? 0  ?Age > 46 (1-yes) 1  ?Neck circumference greater than:Male 16 inches or larger, Male 17inches or larger? 0  ?Male Gender (Yes=1) 1  ?Obstructive Sleep Apnea Score 4  ?Score 5 or greater  Results sent to PCP  ? ? ?

## 2021-05-08 NOTE — Therapy (Deleted)
?OUTPATIENT PHYSICAL THERAPY PROGRESS NOTE ? ? ?Patient Name: Allen Yu ?MRN: [id number] ?DOB:02/12/1945, 76 y.o., male ?Today's Date: 05/08/2021 ? ?PCP: [name], MD ?REFERRING PROVIDER: Nita Sickle, MD  ? ?Progress Note ?Reporting Period 01/26/21 to 05/01/21 ? ?See note below for Objective Data and Assessment of Progress/Goals.  ? ? ? ? ? PT End of Session - 05/08/21 1618   ? ? Visit Number 13   ? Number of Visits 15   ? Date for PT Re-Evaluation 06/16/21   ? Authorization Type Medicare Part A and B; Tricare for Life   ? PT Start Time [id number]   ? PT Stop Time 1700   ? PT Time Calculation (min) 42 min   ? Activity Tolerance Patient tolerated treatment well;Patient limited by fatigue   ? ?  ?  ? ?  ? ? ? ?Past Medical History:  ?Diagnosis Date  ? Anemia   ? Arthritis   ? Carotid stenosis   ? Chronic back pain   ? Chronic HFrEF (heart failure with reduced ejection fraction) ([place])   ? a. 09/2020 Echo: EF 45%, mild LVH; b. 12/2020 Echo: EF 25-30%, glob HK, mod LVH, mildly reduced RV fxn, RVSP 65.5mHg, mod BAE. Triv effusion. Mod MR/TR.  ? CKD (chronic kidney disease), stage III ([place]   ? Constipation   ? Dementia ([place]   ? Diabetes mellitus   ? Dilated cardiomyopathy ([place]   ? a. 09/2020 Echo: EF 45%; b. 12/2020 Echo: EF 25-30%.  ? GERD (gastroesophageal reflux disease)   ? History of kidney stones   ? Hypertension   ? Lung nodule   ? PAF (paroxysmal atrial fibrillation) ([place]   ? Peripheral neuropathy   ? Peripheral vascular disease ([place]   ? a. 06/2020 s/p L AKA; b. 10/2020 s/p R SFA and above-knee popliteal PTA.  ? PTSD (post-traumatic stress disorder)   ? Schizophrenia ([place]   ? Tuberculosis   ? Treated  ? ?Past Surgical History:  ?Procedure Laterality Date  ? ABDOMINAL AORTOGRAM W/LOWER EXTREMITY N/A 05/20/2020  ? Procedure: ABDOMINAL AORTOGRAM W/LOWER EXTREMITY;  Surgeon: FElam Dutch MD;  [facility];  Service: Cardiovascular;  Laterality: N/A;  ? ABDOMINAL AORTOGRAM W/LOWER EXTREMITY N/A  10/27/2020  ? Procedure: ABDOMINAL AORTOGRAM W/LOWER EXTREMITY;  Surgeon: CMarty Heck MD;  [facility];  Service: Cardiovascular;  Laterality: N/A;  ? AMPUTATION Left 06/28/2020  ? Procedure: AMPUTATION ABOVE KNEE LEFT;  Surgeon: ERosetta Posner MD;  [facility];  Service: Vascular;  Laterality: Left;  ? APPENDECTOMY    ? BACK SURGERY  2000, 2013  ? x2  ? BLADDER SURGERY    ? 02  ? CHOLECYSTECTOMY    ? COLONOSCOPY    ? COLONOSCOPY N/A 12/07/2014  ? Procedure: COLONOSCOPY;  Surgeon: RDaneil Dolin MD;  [facility];  Service: Endoscopy;  Laterality: N/A;  10:30 Am  ? EYE SURGERY Bilateral   ? removed metal from eye  ? FEMORAL-TIBIAL BYPASS GRAFT Left 05/27/2020  ? Procedure: LEFT FEMORAL TO PERONEAL ARTERY BYPASS;  Surgeon: ERosetta Posner MD;  [facility]  Service: Vascular;  Laterality: Left;  ? HERNIA REPAIR Right   ? INGUINAL HERNIA REPAIR Left 11/03/2013  ? Procedure: HERNIA REPAIR INGUINAL ADULT;  Surgeon: EGayland Curry MD;  [facility]  Service: General;  Laterality: Left;  ? PERIPHERAL VASCULAR INTERVENTION  10/27/2020  ? Procedure: PERIPHERAL VASCULAR INTERVENTION;  Surgeon: CMarty Heck MD;  Location: [facility];  Service: Cardiovascular;;  ? SHOULDER SURGERY    ? RIGHT SHOULDER   ? VIDEO BRONCHOSCOPY WITH ENDOBRONCHIAL NAVIGATION N/A 07/10/2019  ? Procedure: VIDEO BRONCHOSCOPY WITH ENDOBRONCHIAL NAVIGATION;  Surgeon: Melrose Nakayama, MD;  Location: [facility];  Service: Thoracic;  Laterality: N/A;  ? ?Patient Active Problem List  ? Diagnosis Date Noted  ? Hypokalemia 04/13/2021  ? Colitis 04/12/2021  ? Bacteremia 04/11/2021  ? Pneumonia 04/11/2021  ? Chronic systolic CHF (congestive heart failure) ([place]) 04/11/2021  ? Acute respiratory failure with hypoxia and hypercarbia ([place]) 01/23/2021  ? CKD (chronic kidney disease) stage 3, GFR 30-59 ml/min (HCC) 01/23/2021  ? Acute on chronic systolic CHF (congestive heart failure) ([place]) 01/23/2021  ?  Acute respiratory failure with hypoxia ([place]) 01/13/2021  ? Encounter for fitting and adjustment of hearing aid 12/21/2020  ? Other specified problems related to psychosocial circumstances 11/21/2020  ? Sensorineural hearing loss, bilateral 11/21/2020  ? Tobacco dependence 10/06/2020  ? Benign prostatic hyperplasia with urinary obstruction 10/06/2020  ? Above knee amputation of left lower extremity ([place]) 10/06/2020  ? Other specified counseling 10/06/2020  ? Severe sepsis ([place]) 09/24/2020  ? Schizophrenia, unspecified ([place]) 09/24/2020  ? Unspecified dementia, unspecified severity, without behavioral disturbance, psychotic disturbance, mood disturbance, and anxiety ([place]) 09/24/2020  ? Lung nodule 09/24/2020  ? Indwelling Foley catheter present 09/24/2020  ? Peripheral vascular disease ([place]) 09/24/2020  ? Mass of testicle 09/24/2020  ? Problem related to unspecified psychosocial circumstances 08/01/2020  ? Hypermetropia 07/22/2020  ? Unspecified atrial fibrillation ([place]) 07/22/2020  ? Klebsiella sepsis ([place]) 07/22/2020  ? 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 SIRS (systemic inflammatory response syndrome) ([place]) 07/21/2020  ? Acute metabolic encephalopathy 83/15/1761  ? Hyponatremia   ? S/P AKA (above knee amputation) unilateral, left ([place]) 06/28/2020  ? Gangrene of left foot ([place]) 06/28/2020  ? 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 Critical lower limb ischemia ([place]) 05/21/2020  ? Malnutrition ([place]) 05/19/2020  ? Plantar callus 12/24/2019  ? Cavitary pneumonia 09/14/2019  ? 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  ? Unintentional weight loss 09/14/2019  ? Coronary artery calcification seen on CT scan 10/28/2018  ? Hilar adenopathy 04/10/2016  ? Lung nodule seen on imaging study 07/09/2015  ? Acute encephalopathy   ? Hypoglycemia due to insulin   ? Type 2 diabetes mellitus with other circulatory complications ([place]) 60/73/7106  ? Acute kidney injury superimposed on chronic kidney  disease ([place]) 07/08/2015  ? Tobacco use disorder 07/08/2015  ? Diverticulosis of colon without hemorrhage   ? Left inguinal hernia 09/16/2013  ? Altered mental status 11/17/2012  ? Hypothermia 11/17/2012  ? Leukocytosis 11/17/2012  ? Chronic kidney disease, stage 3 unspecified ([place])   ? Post-traumatic stress disorder, chronic   ? GERD (gastroesophageal reflux disease)   ? Essential (primary) hypertension 09/10/2011  ? Anemia 09/10/2011  ? Diabetic neuropathy ([place]) 09/10/2011  ? DDD (degenerative disc disease), lumbar 09/10/2011  ? Psychosis ([place]) 09/10/2011  ? Diabetes mellitus type 2, uncontrolled 09/10/2011  ? Hyperlipemia 09/10/2011  ? Microalbuminuria 09/10/2011  ? ? ?REFERRING DIAG: Gen. weakness' left AKA ? ?THERAPY DIAG:  ?Muscle weakness (generalized) ? ?Difficulty in walking, not elsewhere classified ? ?Unsteadiness on feet ? ?PERTINENT HISTORY: Lt AKA  ? ?PRECAUTIONS: Falls ? ?SUBJECTIVE: Patient hospitalized from 3/22 to 3/27 for UTI.  Also anemic.  Patient has since developed swelling and blisters on the Right foot. Seeing [facility] Vascular tomorrow. Supposed to get diabetic  shoe and AFO on 04/28/21 ? ?PAIN:  ?Are you having pain? Yes: NPRS scale: 10/10 ?Pain location: "All over", Right foot hurts the worst ?Pain description: chronic general pain  ?Aggravating factors: Unsure ?Relieving factors: Unsure, gabapentin per wife ? ? ? ? ?TODAY'S TREATMENT:  ?05/01/21 ?Re-eval today ? ?Bilat UE MMTs shoulder Flexion 4-/5 ?      Bicep 4-/5 ?       Tricep 4-/5 ?   ? ?Lower extremity MMTs Right hip Flexion 4-/5 ?       Right hip extension 3+/5 ?       Right knee extension 3+/5 ?  **did not test ankle strength due to wounds Right foot and lower leg ? ?WC to mat transfer with SBA, mat to WC with SBA ? ?Review of HEP ? ? ? ? ?04/03/21 ?Nu step Lv3 8 min seat 12  ?LAQ 5lb 3 x10 ?Seated march 5lb 3x10 ?Seated hamstring curl BTB 3 x 10  ?Sit stand 2 x 5 with min Guard from elevated mat using RW ? ? ?PATIENT  EDUCATION: ?Education details: on exercise form and function and updated HEP  ?Person educated: Patient and Spouse ?Education method: Explanation and Demonstration ?Education comprehension: verbalized understanding and ret

## 2021-05-10 NOTE — H&P (Signed)
Surgical History & Physical ? ?Patient Name: Allen Yu DOB: 06-Jul-1945 ? ?Surgery: Cataract extraction with intraocular lens implant phacoemulsification; Right Eye ? ?Surgeon: Baruch Goldmann MD ?Surgery Date:  05-12-21 ?Pre-Op Date:  05-04-21 ? ?HPI: ?A 25 Yr. old male patient is referred from [facility] for cataract sx 1. 1. The patient complains of difficulty when viewing TV, reading closed caption, news scrolls on TV, which began many years ago. Both eyes are affected. The episode is gradual. The condition's severity increased since last visit. Symptoms occur when the patient is inside and outside. This is negatively affecting the patient's quality of life and the patient is unable to function adequately in life with the current level of vision. HPI Completed by Dr. Baruch Goldmann ? ?Medical History: ?Diabetes - DM Type 2 ?Heart Problem ?LDL ?Schizophrenia, Amputaded above knee, Anemia, BPH, ... ? ?Review of Systems ?Negative Allergic/Immunologic ?Negative Cardiovascular ?Negative Constitutional ?Negative Ear, Nose, Mouth & Throat ?Negative Endocrine ?Negative Eyes ?Negative Gastrointestinal ?Negative Genitourinary ?Negative Hemotologic/Lymphatic ?Negative Integumentary ?Negative Musculoskeletal ?Negative Neurological ?Negative Psychiatry ?Negative Respiratory ? ?Social ?  Current every day smoker  ? ?Medication ?Acetaminophen, Losartan, Metformin, Donepezil, Atorvastatin, Metoprolol, Risperidone, Mirtazapine, Tramadol hydrochloride,  ? ?Sx/Procedures ? Amputation above knee, Bladder catheter, Leg artery balloon, Back Surgery,  ? ?Drug Allergies  ?Propoxyphen, Diovan, Codeine,  ? ?History & Physical: ?Heent: Cataract, Right Eye ?NECK: supple without bruits ?LUNGS: lungs clear to auscultation ?CV: regular rate and rhythm ?Abdomen: soft and non-tender ? ?Impression & Plan: ?Assessment: ?1.  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  Diabetes Type 2 No retinopathy (E11.9) ?3.   BLEPHARITIS; Right Upper Lid, Right Lower Lid, Left Upper Lid, Left Lower Lid (H01.001, H01.002,H01.004,H01.005) ?4.  Pinguecula; Both Eyes (H11.153) ?5.  COMBINED FORMS AGE RELATED CATARACT; Left Eye [phone]) ? ?Plan: 1.  Cataract accounts for the patient's decreased vision. This visual impairment is not correctable with a tolerable change in glasses or contact lenses. Cataract surgery with an implantation of a new lens should significantly improve the visual and functional status of the patient. Discussed all risks, benefits, alternatives, and potential complications. Discussed the procedures and recovery. Patient desires to have surgery. A-scan ordered and performed today for intra-ocular lens calculations. The surgery will be performed in order to improve vision for driving, reading, and for eye examinations. Recommend phacoemulsification with intra-ocular lens. Recommend Dextenza for post-operative pain and inflammation. ?Right Eye. first. ?Dilates poorly - shugacaine by protocol. ?Vision [place]. ?Malyugin Ring. ?Omidira. ? ?2.  Stressed importance of blood sugar and blood pressure control, and also yearly eye examinations. ?Discussed the need for ongoing proactive ocular exams and treatment, hopefully before visual symptoms develop. ? ?3.  Recommend regular lid cleaning. ? ?4.  Observe; Artificial tears as needed for irritation. ? ?5.  Will address after right eye. ?

## 2021-05-11 ENCOUNTER — Other Ambulatory Visit: Payer: Self-pay | Admitting: *Deleted

## 2021-05-11 ENCOUNTER — Ambulatory Visit (HOSPITAL_COMMUNITY): Payer: Medicare Other | Admitting: Physical Therapy

## 2021-05-11 DIAGNOSIS — R2681 Unsteadiness on feet: Secondary | ICD-10-CM | POA: Diagnosis not present

## 2021-05-11 DIAGNOSIS — R262 Difficulty in walking, not elsewhere classified: Secondary | ICD-10-CM

## 2021-05-11 DIAGNOSIS — M6281 Muscle weakness (generalized): Secondary | ICD-10-CM | POA: Diagnosis not present

## 2021-05-11 NOTE — Patient Outreach (Signed)
[facility]) Care Management ?Telephonic RN Care Manager Note ? ? ?05/18/2021 ?Name:  Allen Yu MRN:  [id number] DOB:  1945/11/16 ? ?Summary: ?Successful follow up outreach to wife ?[name] is out completing some errands  ?She reports [name] is progressing well  ?She denies medical concerns  ? ? ?Present Aide leaves on Friday 05/11/21 ?No new aide hs been assigned nor visited the home ? ?Indwelling foley  ?remains intact and patent ?No other options for urinary retention offered by his urologist  than the foley with frequent checks ?He did experience an elevated temperature of 98.2  ?[name] continues to monitor [name]'s temperature at home   ?The tip of his penis is healing well [name] places ordered cream on it at home  ?She did take the suggestions to secure the foley bag differently to prevent accidental injury  ? ?Recommendations/Changes made from today's visit: ?Follow up outreach on status of home services and urology home care  ?Questions answered ?THN future follow outreach discussed  ? ? ?Subjective: ?Allen Yu is an 76 y.o. year old male who is a primary patient of Allen Noble, MD. The care management team was consulted for assistance with care management and/or care coordination needs.   ? ?Telephonic RN Care Manager completed Telephone Visit today.  ? ?Objective: ? ?Medications Reviewed Today   ? ? Reviewed by Teena Irani, PTA (Physical Therapy Assistant) on 05/16/21 at 1429  Med List Status: <None>  ? ?Medication Order Taking? Sig Documenting Provider Last Dose Status Informant  ?acetaminophen (TYLENOL) 500 MG tablet [id number] No Take 500 mg by mouth 2 (two) times daily as needed for moderate pain. [provider] 05/11/2021 Active Spouse/Significant Other, Pharmacy Records  ?Alcohol Swabs (ALCOHOL PADS) 70 % PADS [id number] No USE 1 PAD AS DIRECTED [provider] Taking Active Spouse/Significant Other, Pharmacy Records  ?aspirin EC 81 MG  tablet [id number] No Take 1 tablet (81 mg total) by mouth daily with breakfast. Roxan Hockey, MD 05/11/2021 Active Spouse/Significant Other, Pharmacy Records  ?atorvastatin (LIPITOR) 40 MG tablet [id number] No Take 40 mg by mouth daily. [provider] 05/11/2021 Active Spouse/Significant Other, Pharmacy Records  ?B-D UF III MINI PEN NEEDLES 31G X 5 MM MISC [id number] No SMARTSIG:1 Each SUB-Q Daily [provider] Taking Active Spouse/Significant Other, Pharmacy Records  ?clopidogrel (PLAVIX) 75 MG tablet [id number] No Take 1 tablet (75 mg total) by mouth daily. Roxan Hockey, MD 05/11/2021 Active Spouse/Significant Other, Pharmacy Records  ?Dextromethorphan-GG-APAP (CORICIDIN HBP COLD/COUGH/FLU) 10-200-325 MG/15ML LIQD [id number] No Take 30 mLs by mouth as needed (cough). [provider] Taking Active Spouse/Significant Other, Pharmacy Records  ?donepezil (ARICEPT) 10 MG tablet [id number] No Take 10 mg by mouth daily. [provider] Taking Active Spouse/Significant Other, Pharmacy Records  ?feeding supplement, ENSURE COMPLETE, (ENSURE COMPLETE) LIQD [id number] No Take 237 mLs by mouth 2 (two) times daily between meals. Roxan Hockey, MD 05/11/2021 Active Spouse/Significant Other, Pharmacy Records  ?ferrous sulfate 325 (65 FE) MG tablet [id number] No Take 325 mg by mouth daily with breakfast. [provider] 05/11/2021 Active Spouse/Significant Other, Pharmacy Records  ?gabapentin (NEURONTIN) 100 MG capsule [id number] No Take 100 mg by mouth daily. [provider] 05/11/2021 Active Spouse/Significant Other, Pharmacy Records  ?LANTUS 100 UNIT/ML injection [id number] No Inject 0.06 mLs (6 Units total) into the skin at bedtime as needed (High blood glucose). If Blood glucose over 200  ?Patient taking differently: Inject 25-30 Units into the skin daily as needed (High  blood glucose). If Blood glucose over 100-300 25units and if blood sugar is over 300 30units  ? Roxan Hockey, MD Taking Active Spouse/Significant Other, Pharmacy Records  ?losartan (COZAAR) 50 MG tablet [id number] No Take 1 tablet (50 mg total) by mouth daily.  ?Patient taking differently: Take 100 mg by mouth daily.  ? Erma Heritage, PA-C Taking Active Spouse/Significant Other, Pharmacy Records  ?metoprolol succinate (TOPROL-XL) 50 MG 24 hr tablet [id number] No Take 1 tablet (50 mg total) by mouth daily. Take with or immediately following a meal. Erma Heritage, PA-C 04/11/2021 0730 Expired 04/11/21 2359 Spouse/Significant Other, Pharmacy Records  ?metoprolol succinate (TOPROL-XL) 50 MG 24 hr tablet [id number] No Take 50 mg by mouth daily. Take with or immediately following a meal. [provider] 05/11/2021 Active   ?Multiple Vitamins-Minerals (MULTIVITAMIN WITH MINERALS) tablet [id number] No Take 1 tablet by mouth daily. Roxan Hockey, MD 05/11/2021 Active Spouse/Significant Other, Pharmacy Records  ?omeprazole (PRILOSEC) 20 MG capsule [id number] No Take 1 capsule by mouth daily. [provider] 05/11/2021 Active Spouse/Significant Other, Pharmacy Records  ?ondansetron (ZOFRAN) 4 MG tablet [id number] No Take 1 tablet (4 mg total) by mouth every 6 (six) hours as needed for nausea. Roxan Hockey, MD Past Week Active Spouse/Significant Other, Pharmacy Records  ?risperiDONE (RISPERDAL) 0.5 MG tablet [id number] No Take 1 tablet (0.5 mg total) by mouth every 12 (twelve) hours as needed (Restlessness and agitation). Roxan Hockey, MD 05/11/2021 Active Spouse/Significant Other, Pharmacy Records  ?torsemide (DEMADEX) 20 MG tablet [id number] No Take 1.5 tablets (30 mg total) by mouth daily.  ?Patient taking differently: Take 10 mg by mouth daily. Take 10 mg daily  ? Barton Dubois, MD Taking Active Spouse/Significant Other, Pharmacy Records  ?traMADol (ULTRAM) 50 MG tablet [id number] No Take 50 mg by mouth every 6 (six) hours as needed for moderate pain. [provider] Past Week Active  Spouse/Significant Other, Pharmacy Records  ?Vitamin D, Ergocalciferol, (DRISDOL) 1.25 MG (50000 UNIT) CAPS capsule [id number] No Take 50,000 Units by mouth every Friday. [provider] 05/11/2021 Active Spouse/Significant Other, Pharmacy Records  ?Med List Note Jolayne Haines, CPhT 07/09/16 0920): Dr. Asencion Yu [phone]  ? ?  ?  ? ?  ? ? ? ?SDOH:  (Social Determinants of Health) assessments and interventions performed:  ? ? ?Care Plan ? ?Review of patient past medical history, allergies, medications, health status, including review of consultants reports, laboratory and other test data, was performed as part of comprehensive evaluation for care management services.  ? ?Care Plan : RN Care Manager Plan of Care  ?Updates made by Barbaraann Faster, RN since 05/18/2021 12:00 AM  ?  ? ?Problem: Complex Care Coordination Needs and disease management in patient with DM II, CKD 3, Afib, PTSD   ?Priority: High  ?  ? ?Long-Range Goal: Establish Plan of Care for Management Complex SDOH Barriers, disease management and Care Coordination Needs in patient with DM II, CKD 3, Afib, PTSD   ?Start Date: 12/21/2020  ?This Visit's Progress: On track  ?Recent Progress: On track  ?Priority: High  ?Note:   ?Current Barriers:  ?Knowledge Deficits related to plan of care for management of Atrial Fibrillation, DMII, CKD Stage 3, and PTSD/dementia  ?Care Coordination needs related to Lacks knowledge of community resource: VA benefits ?Health behaviors, bilateral hearing (12/120/22 now has hearing aides), [place] Veteran with PTSD/psychosis and reported psychosocial population concerns, memory concerns  ? ?RN CM Clinical Goal(s):  ?Patient will work with [employer] administration (VA) benefit providers  to continue home management of DM II, Afib, CKD, PTSD/dementia as evidenced by decrease in admissions and improved lab values, behavior  through collaboration with RN Care manager, provider, and care team.  ? ?Interventions: ?Follow up  outreaches to wife and patient as allowed to further assess and assist with resources/guidance, care coordination and/or disease management/education  ?Inter-disciplinary care team collaboration (see lo

## 2021-05-11 NOTE — Therapy (Signed)
?OUTPATIENT PHYSICAL THERAPY PROGRESS NOTE ? ? ?Patient Name: Allen Yu ?MRN: [id number] ?DOB:01-31-1945, 76 y.o., male ?Today's Date: 05/11/2021 ? ?PCP: [name], MD ?REFERRING PROVIDER: Nita Sickle, MD  ? ? ? ? PT End of Session - 05/11/21 1625   ? ? Visit Number 14   ? Number of Visits 27   ? Date for PT Re-Evaluation 06/22/21   ? Authorization Type Medicare Part A and B; [employer] for Life   ? PT Start Time 1620   ? PT Stop Time 1700   ? PT Time Calculation (min) 40 min   ? Activity Tolerance Patient tolerated treatment well;Patient limited by fatigue   ? ?  ?  ? ?  ? ? ? ?Past Medical History:  ?Diagnosis Date  ? Anemia   ? Arthritis   ? Carotid stenosis   ? Chronic back pain   ? Chronic HFrEF (heart failure with reduced ejection fraction) ([place])   ? a. 09/2020 Echo: EF 45%, mild LVH; b. 12/2020 Echo: EF 25-30%, glob HK, mod LVH, mildly reduced RV fxn, RVSP 65.2mHg, mod BAE. Triv effusion. Mod MR/TR.  ? CKD (chronic kidney disease), stage III ([place]   ? Constipation   ? Dementia ([place]   ? Diabetes mellitus   ? Dilated cardiomyopathy ([place]   ? a. 09/2020 Echo: EF 45%; b. 12/2020 Echo: EF 25-30%.  ? GERD (gastroesophageal reflux disease)   ? History of kidney stones   ? Hypertension   ? Lung nodule   ? PAF (paroxysmal atrial fibrillation) ([place]   ? Peripheral neuropathy   ? Peripheral vascular disease ([place]   ? a. 06/2020 s/p L AKA; b. 10/2020 s/p R SFA and above-knee popliteal PTA.  ? PTSD (post-traumatic stress disorder)   ? Schizophrenia ([place]   ? Tuberculosis   ? Treated  ? ?Past Surgical History:  ?Procedure Laterality Date  ? ABDOMINAL AORTOGRAM W/LOWER EXTREMITY N/A 05/20/2020  ? Procedure: ABDOMINAL AORTOGRAM W/LOWER EXTREMITY;  Surgeon: FElam Dutch MD;  [facility];  Service: Cardiovascular;  Laterality: N/A;  ? ABDOMINAL AORTOGRAM W/LOWER EXTREMITY N/A 10/27/2020  ? Procedure: ABDOMINAL AORTOGRAM W/LOWER EXTREMITY;  Surgeon: CMarty Heck MD;  [facility];   Service: Cardiovascular;  Laterality: N/A;  ? AMPUTATION Left 06/28/2020  ? Procedure: AMPUTATION ABOVE KNEE LEFT;  Surgeon: ERosetta Posner MD;  [facility];  Service: Vascular;  Laterality: Left;  ? APPENDECTOMY    ? BACK SURGERY  2000, 2013  ? x2  ? BLADDER SURGERY    ? 02  ? CHOLECYSTECTOMY    ? COLONOSCOPY    ? COLONOSCOPY N/A 12/07/2014  ? Procedure: COLONOSCOPY;  Surgeon: RDaneil Dolin MD;  [facility];  Service: Endoscopy;  Laterality: N/A;  10:30 Am  ? EYE SURGERY Bilateral   ? removed metal from eye  ? FEMORAL-TIBIAL BYPASS GRAFT Left 05/27/2020  ? Procedure: LEFT FEMORAL TO PERONEAL ARTERY BYPASS;  Surgeon: ERosetta Posner MD;  [facility]  Service: Vascular;  Laterality: Left;  ? HERNIA REPAIR Right   ? INGUINAL HERNIA REPAIR Left 11/03/2013  ? Procedure: HERNIA REPAIR INGUINAL ADULT;  Surgeon: EGayland Curry MD;  [facility]  Service: General;  Laterality: Left;  ? PERIPHERAL VASCULAR INTERVENTION  10/27/2020  ? Procedure: PERIPHERAL VASCULAR INTERVENTION;  Surgeon: CMarty Heck MD;  [facility];  Service: Cardiovascular;;  ? SHOULDER SURGERY    ? RIGHT SHOULDER   ?  VIDEO BRONCHOSCOPY WITH ENDOBRONCHIAL NAVIGATION N/A 07/10/2019  ? Procedure: VIDEO BRONCHOSCOPY WITH ENDOBRONCHIAL NAVIGATION;  Surgeon: Melrose Nakayama, MD;  Location: [facility];  Service: Thoracic;  Laterality: N/A;  ? ?Patient Active Problem List  ? Diagnosis Date Noted  ? Hypokalemia 04/13/2021  ? Colitis 04/12/2021  ? Bacteremia 04/11/2021  ? Pneumonia 04/11/2021  ? Chronic systolic CHF (congestive heart failure) ([place]) 04/11/2021  ? Acute respiratory failure with hypoxia and hypercarbia ([place]) 01/23/2021  ? CKD (chronic kidney disease) stage 3, GFR 30-59 ml/min (HCC) 01/23/2021  ? Acute on chronic systolic CHF (congestive heart failure) ([place]) 01/23/2021  ? Acute respiratory failure with hypoxia ([place]) 01/13/2021  ? Encounter for fitting and adjustment of hearing aid 12/21/2020  ? Other  specified problems related to psychosocial circumstances 11/21/2020  ? Sensorineural hearing loss, bilateral 11/21/2020  ? Tobacco dependence 10/06/2020  ? Benign prostatic hyperplasia with urinary obstruction 10/06/2020  ? Above knee amputation of left lower extremity ([place]) 10/06/2020  ? Other specified counseling 10/06/2020  ? Severe sepsis ([place]) 09/24/2020  ? Schizophrenia, unspecified ([place]) 09/24/2020  ? Unspecified dementia, unspecified severity, without behavioral disturbance, psychotic disturbance, mood disturbance, and anxiety ([place]) 09/24/2020  ? Lung nodule 09/24/2020  ? Indwelling Foley catheter present 09/24/2020  ? Peripheral vascular disease ([place]) 09/24/2020  ? Mass of testicle 09/24/2020  ? Problem related to unspecified psychosocial circumstances 08/01/2020  ? Hypermetropia 07/22/2020  ? Unspecified atrial fibrillation ([place]) 07/22/2020  ? Klebsiella sepsis ([place]) 07/22/2020  ? 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 SIRS (systemic inflammatory response syndrome) ([place]) 07/21/2020  ? Acute metabolic encephalopathy 00/86/7619  ? Hyponatremia   ? S/P AKA (above knee amputation) unilateral, left ([place]) 06/28/2020  ? Gangrene of left foot ([place]) 06/28/2020  ? 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 ([place]) 05/27/2020  ? Critical lower limb ischemia ([place]) 05/21/2020  ? Malnutrition ([place]) 05/19/2020  ? Plantar callus 12/24/2019  ? Cavitary pneumonia 09/14/2019  ? 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  ? Unintentional weight loss 09/14/2019  ? Coronary artery calcification seen on CT scan 10/28/2018  ? Hilar adenopathy 04/10/2016  ? Lung nodule seen on imaging study 07/09/2015  ? Acute encephalopathy   ? Hypoglycemia due to insulin   ? Type 2 diabetes mellitus with other circulatory complications ([place]) 50/93/2671  ? Acute kidney injury superimposed on chronic kidney disease ([place]) 07/08/2015  ? Tobacco use disorder 07/08/2015  ? Diverticulosis of colon without hemorrhage   ? Left inguinal hernia  09/16/2013  ? Altered mental status 11/17/2012  ? Hypothermia 11/17/2012  ? Leukocytosis 11/17/2012  ? Chronic kidney disease, stage 3 unspecified ([place])   ? Post-traumatic stress disorder, chronic   ? GERD (gastroesophageal reflux disease)   ? Essential (primary) hypertension 09/10/2011  ? Anemia 09/10/2011  ? Diabetic neuropathy ([place]) 09/10/2011  ? DDD (degenerative disc disease), lumbar 09/10/2011  ? Psychosis ([place]) 09/10/2011  ? Diabetes mellitus type 2, uncontrolled 09/10/2011  ? Hyperlipemia 09/10/2011  ? Microalbuminuria 09/10/2011  ? ? ?REFERRING DIAG: Gen. weakness' left AKA ? ?THERAPY DIAG:  ?Muscle weakness (generalized) ? ?Unsteadiness on feet ? ?Difficulty in walking, not elsewhere classified ? ?PERTINENT HISTORY: Lt AKA  ? ?PRECAUTIONS: Falls ? ?SUBJECTIVE: PT comes to department non verbal.  Does not speak for most of the treatment.  PT very angry and does not appear to want to be here.   ?PAIN:  ?Are you having pain? Yes: NPRS scale: 8/10 ?Pain location: "All over", Right foot hurts the worst ?Pain description: chronic general  pain  ?Aggravating factors: Unsure ?Relieving factors: Unsure, gabapentin per wife ? ? ? ? ?TODAY'S TREATMENT:   ?05/11/21: ? ?Hip flexion          Rt:  3/5      Lt    3/5        tested supine  ?Hip abduction     Rt:  2+/5;   LT   1/5       tested side lying  ?Hip extension     RT 1/5       LT  1/5        tested prone ?Knee extension  RT 3/5       n/a ?Knee flexion       RT 1/5       n/a  ?Supine:  SLR x 10 ?               Rt LE internal rotation x 5 ?               Rt leg bridge x 5 ?               AA RT leg hip abduction ? Prone :  AA RT LE hamstring curl x 10  ?               Glut set x 10  ?05/01/21 ?Re-eval today ? ?Bilat UE MMTs shoulder Flexion 4-/5 ?      Bicep 4-/5 ?       Tricep 4-/5 ?   ? ?Lower extremity MMTs Right hip Flexion 4-/5 ?       Right hip extension 3+/5 ?       Right knee extension 3+/5 ?  **did not test ankle strength due to wounds Right foot and lower  leg ? ?WC to mat transfer with SBA, mat to WC with SBA ? ?Review of HEP ? ? ? ? ?04/03/21 ?Nu step Lv3 8 min seat 12  ?LAQ 5lb 3 x10 ?Seated march 5lb 3x10 ?Seated hamstring curl BTB 3 x 10  ?Sit stand 2 x 5

## 2021-05-12 ENCOUNTER — Encounter (HOSPITAL_COMMUNITY): Payer: Self-pay | Admitting: Ophthalmology

## 2021-05-12 ENCOUNTER — Encounter (HOSPITAL_COMMUNITY)
Admission: RE | Disposition: A | Discharge status: Home / Self Care | Payer: Self-pay | Source: Ambulatory Visit | Attending: Ophthalmology

## 2021-05-12 ENCOUNTER — Ambulatory Visit (HOSPITAL_COMMUNITY): Payer: Medicare Other | Admitting: Anesthesiology

## 2021-05-12 ENCOUNTER — Ambulatory Visit (HOSPITAL_COMMUNITY)
Admission: RE | Admit: 2021-05-12 | Discharge: 2021-05-12 | Disposition: A | Discharge status: Home / Self Care | Payer: Medicare Other | Source: Ambulatory Visit | Attending: Ophthalmology | Admitting: Ophthalmology

## 2021-05-12 ENCOUNTER — Ambulatory Visit (HOSPITAL_BASED_OUTPATIENT_CLINIC_OR_DEPARTMENT_OTHER): Payer: Medicare Other | Admitting: Anesthesiology

## 2021-05-12 DIAGNOSIS — F17210 Nicotine dependence, cigarettes, uncomplicated: Secondary | ICD-10-CM | POA: Diagnosis not present

## 2021-05-12 DIAGNOSIS — I509 Heart failure, unspecified: Secondary | ICD-10-CM

## 2021-05-12 DIAGNOSIS — E1151 Type 2 diabetes mellitus with diabetic peripheral angiopathy without gangrene: Secondary | ICD-10-CM | POA: Insufficient documentation

## 2021-05-12 DIAGNOSIS — E1136 Type 2 diabetes mellitus with diabetic cataract: Secondary | ICD-10-CM | POA: Diagnosis not present

## 2021-05-12 DIAGNOSIS — H0100B Unspecified blepharitis left eye, upper and lower eyelids: Secondary | ICD-10-CM | POA: Insufficient documentation

## 2021-05-12 DIAGNOSIS — H0100A Unspecified blepharitis right eye, upper and lower eyelids: Secondary | ICD-10-CM | POA: Diagnosis not present

## 2021-05-12 DIAGNOSIS — I2510 Atherosclerotic heart disease of native coronary artery without angina pectoris: Secondary | ICD-10-CM | POA: Insufficient documentation

## 2021-05-12 DIAGNOSIS — I110 Hypertensive heart disease with heart failure: Secondary | ICD-10-CM | POA: Diagnosis not present

## 2021-05-12 DIAGNOSIS — F17200 Nicotine dependence, unspecified, uncomplicated: Secondary | ICD-10-CM | POA: Diagnosis not present

## 2021-05-12 DIAGNOSIS — I10 Essential (primary) hypertension: Secondary | ICD-10-CM | POA: Diagnosis not present

## 2021-05-12 DIAGNOSIS — H2521 Age-related cataract, morgagnian type, right eye: Secondary | ICD-10-CM | POA: Diagnosis not present

## 2021-05-12 HISTORY — PX: CATARACT EXTRACTION W/PHACO: SHX586

## 2021-05-12 LAB — GLUCOSE, CAPILLARY: Glucose-Capillary: 89 mg/dL (ref 70–99)

## 2021-05-12 SURGERY — PHACOEMULSIFICATION, CATARACT, WITH IOL INSERTION
Anesthesia: Monitor Anesthesia Care | Site: Eye | Laterality: Right

## 2021-05-12 MED ORDER — PHENYLEPHRINE-KETOROLAC 1-0.3 % IO SOLN
INTRAOCULAR | Status: DC | PRN
  Administered 2021-05-12: 500 mL via OPHTHALMIC

## 2021-05-12 MED ORDER — TROPICAMIDE 1 % OP SOLN
1.0000 [drp] | OPHTHALMIC | Status: AC | PRN
  Administered 2021-05-12 (×3): 1 [drp] via OPHTHALMIC

## 2021-05-12 MED ORDER — LIDOCAINE HCL 3.5 % OP GEL
1.0000 "application " | Freq: Once | OPHTHALMIC | Status: AC
  Administered 2021-05-12: 1 via OPHTHALMIC

## 2021-05-12 MED ORDER — PHENYLEPHRINE HCL 2.5 % OP SOLN
1.0000 [drp] | OPHTHALMIC | Status: AC | PRN
  Administered 2021-05-12 (×3): 1 [drp] via OPHTHALMIC

## 2021-05-12 MED ORDER — POVIDONE-IODINE 5 % OP SOLN
OPHTHALMIC | Status: DC | PRN
  Administered 2021-05-12: 1 via OPHTHALMIC

## 2021-05-12 MED ORDER — TRYPAN BLUE 0.06 % IO SOSY
PREFILLED_SYRINGE | INTRAOCULAR | Status: AC
  Filled 2021-05-12: qty 0.5

## 2021-05-12 MED ORDER — TETRACAINE HCL 0.5 % OP SOLN
1.0000 [drp] | OPHTHALMIC | Status: AC | PRN
  Administered 2021-05-12 (×3): 1 [drp] via OPHTHALMIC

## 2021-05-12 MED ORDER — TRYPAN BLUE 0.06 % IO SOSY
PREFILLED_SYRINGE | INTRAOCULAR | Status: DC | PRN
  Administered 2021-05-12: 0.5 mL via INTRAOCULAR

## 2021-05-12 MED ORDER — NEOMYCIN-POLYMYXIN-DEXAMETH 3.5-10000-0.1 OP SUSP
OPHTHALMIC | Status: DC | PRN
  Administered 2021-05-12: 2 [drp] via OPHTHALMIC

## 2021-05-12 MED ORDER — BSS IO SOLN
INTRAOCULAR | Status: DC | PRN
  Administered 2021-05-12: 15 mL via INTRAOCULAR

## 2021-05-12 MED ORDER — SODIUM HYALURONATE 23MG/ML IO SOSY
PREFILLED_SYRINGE | INTRAOCULAR | Status: DC | PRN
  Administered 2021-05-12: 0.6 mL via INTRAOCULAR

## 2021-05-12 MED ORDER — STERILE WATER FOR IRRIGATION IR SOLN
Status: DC | PRN
  Administered 2021-05-12: 250 mL

## 2021-05-12 MED ORDER — EPINEPHRINE PF 1 MG/ML IJ SOLN
INTRAOCULAR | Status: DC | PRN
  Administered 2021-05-12: 1 mL via OPHTHALMIC

## 2021-05-12 MED ORDER — PHENYLEPHRINE-KETOROLAC 1-0.3 % IO SOLN
INTRAOCULAR | Status: AC
  Filled 2021-05-12: qty 4

## 2021-05-12 MED ORDER — SODIUM HYALURONATE 10 MG/ML IO SOLUTION
PREFILLED_SYRINGE | INTRAOCULAR | Status: DC | PRN
  Administered 2021-05-12: 0.85 mL via INTRAOCULAR

## 2021-05-12 SURGICAL SUPPLY — 19 items
CATARACT SUITE SIGHTPATH (MISCELLANEOUS) ×2 IMPLANT
CLOTH BEACON ORANGE TIMEOUT ST (SAFETY) ×2 IMPLANT
EYE SHIELD UNIVERSAL CLEAR (GAUZE/BANDAGES/DRESSINGS) ×1 IMPLANT
FEE CATARACT SUITE SIGHTPATH (MISCELLANEOUS) ×1 IMPLANT
GLOVE BIOGEL PI IND STRL 7.0 (GLOVE) ×2 IMPLANT
GLOVE BIOGEL PI IND STRL 8 (GLOVE) IMPLANT
GLOVE BIOGEL PI INDICATOR 7.0 (GLOVE) ×2
GLOVE BIOGEL PI INDICATOR 8 (GLOVE) ×1
GOWN STRL REUS W/TWL XL LVL3 (GOWN DISPOSABLE) ×1 IMPLANT
LENS IOL RAYNER 18.5 (Intraocular Lens) ×2 IMPLANT
LENS IOL RAYONE EMV 18.5 (Intraocular Lens) IMPLANT
NDL HYPO 18GX1.5 BLUNT FILL (NEEDLE) ×1 IMPLANT
NEEDLE HYPO 18GX1.5 BLUNT FILL (NEEDLE) ×2 IMPLANT
PAD ARMBOARD 7.5X6 YLW CONV (MISCELLANEOUS) ×2 IMPLANT
RING MALYGIN 7.0 (MISCELLANEOUS) IMPLANT
SYR TB 1ML LL NO SAFETY (SYRINGE) ×2 IMPLANT
TAPE SURG TRANSPORE 1 IN (GAUZE/BANDAGES/DRESSINGS) IMPLANT
TAPE SURGICAL TRANSPORE 1 IN (GAUZE/BANDAGES/DRESSINGS) ×2
WATER STERILE IRR 250ML POUR (IV SOLUTION) ×2 IMPLANT

## 2021-05-12 NOTE — Op Note (Signed)
Date of procedure: 05/12/21 ? ?Pre-operative diagnosis: Mature Visually significant age-related cataract, Right Eye (H25.21) ? ?Post-operative diagnosis: Mature Visually significant age-related cataract, Right Eye ? ?Procedure: Complex Removal of cataract via phacoemulsification and insertion of intra-ocular lens Rayner [id number] +18.5D into the capsular bag of the Right Eye ? ?Attending surgeon: Gerda Diss. Marisa Hua, MD, MA ? ?Anesthesia: MAC, Topical [place] ? ?Complications: None ? ?Estimated Blood Loss: <33m (minimal) ? ?Specimens: None ? ?Implants: As above ? ?Indications:  Mature Visually significant age-related cataract, Right Eye ? ?Procedure:  ?The patient was seen and identified in the pre-operative area. The operative eye was identified and dilated.  The operative eye was marked.  Topical anesthesia was administered to the operative eye.    ? ?The patient was then to the operative suite and placed in the supine position.  A timeout was performed confirming the patient, procedure to be performed, and all other relevant information.   The patient's face was prepped and draped in the usual fashion for intra-ocular surgery.  A lid speculum was placed into the operative eye and the surgical microscope moved into place and focused.  A lack of red reflex due to a mature cataract was confirmed.  A superotemporal paracentesis was created using a 20 gauge paracentesis blade.  Vision blue was injected into the anterior chamber.  Shugarcaine was injected into the anterior chamber.  Viscoelastic was injected into the anterior chamber.  A temporal clear-corneal main wound incision was created using a 2.468mmicrokeratome.  A continuous curvilinear capsulorrhexis was initiated using an irrigating cystitome and completed using capsulorrhexis forceps.  Hydrodissection and hydrodeliniation were performed.  Viscoelastic was injected into the anterior chamber.  A phacoemulsification handpiece and a chopper as a second instrument were  used to remove the nucleus and epinucleus. The irrigation/aspiration handpiece was used to remove any remaining cortical material.  ? ?The capsular bag was reinflated with viscoelastic, checked, and found to be intact. The intraocular lens was inserted into the capsular bag and dialed into place using a kuglen hook.  The irrigation/aspiration handpiece was used to remove any remaining viscoelastic.  The clear corneal wound and paracentesis wounds were then hydrated and checked with Weck-Cels to be watertight.  The lid-speculum and drape was removed, and the patient's face was cleaned with a wet and dry 4x4.  Maxitrol was instilled in the eye before a clear shield was taped over the eye. The patient was taken to the post-operative care unit in good condition, having tolerated the procedure well. ? ?Post-Op Instructions: [facility] and will receive all other orders and instructions. ? ?

## 2021-05-12 NOTE — Transfer of Care (Signed)
Immediate Anesthesia Transfer of Care Note ? ?Patient: Allen Yu ? ?Procedure(s) Performed: CATARACT EXTRACTION PHACO AND INTRAOCULAR LENS PLACEMENT (IOC) (Right: Eye) ? ?Patient Location: Short Stay ? ?Anesthesia Type:MAC ? ?Level of Consciousness: awake ? ?Airway & Oxygen Therapy: Patient Spontanous Breathing ? ?Post-op Assessment: Report given to RN ? ?Post vital signs: Reviewed and stable ? ?Last Vitals:  ?Vitals Value Taken Time  ?BP    ?Temp 36.8 ?C 05/12/21 0927  ?Pulse 72 05/12/21 0927  ?Resp 18 05/12/21 0927  ?SpO2 93 % 05/12/21 0927  ? ? ?Last Pain:  ?Vitals:  ? 05/12/21 0927  ?TempSrc: Oral  ?PainSc: 5   ?   ? ?Patients Stated Pain Goal: 5 (05/12/21 7858) ? ?Complications: No notable events documented. ?

## 2021-05-12 NOTE — Discharge Instructions (Addendum)
Please discharge patient when stable, will follow up today with Dr. Wrzosek at the [facility] office immediately following discharge.  Leave shield in place until visit.  All paperwork with discharge instructions will be given at the office.   [facility] Address:  [address]  [place], [place] [address]  

## 2021-05-12 NOTE — Anesthesia Preprocedure Evaluation (Signed)
Anesthesia Evaluation  ?Patient identified by MRN, date of birth, ID band ?Patient awake ? ? ? ?Reviewed: ?Allergy & Precautions, H&P , NPO status , Patient's Chart, lab work & pertinent test results, reviewed documented beta blocker date and time  ? ?Airway ?Mallampati: II ? ?TM Distance: >3 FB ?Neck ROM: full ? ? ? Dental ?no notable dental hx. ? ?  ?Pulmonary ?neg pulmonary ROS, Current Smoker,  ?  ?Pulmonary exam normal ?breath sounds clear to auscultation ? ? ? ? ? ? Cardiovascular ?Exercise Tolerance: Good ?hypertension, + CAD, + Peripheral Vascular Disease and +CHF  ? ?Rhythm:regular Rate:Normal ? ? ?  ?Neuro/Psych ?PSYCHIATRIC DISORDERS Anxiety Schizophrenia Dementia  Neuromuscular disease   ? GI/Hepatic ?Neg liver ROS, GERD  Medicated,  ?Endo/Other  ?negative endocrine ROSdiabetes, Type 2 ? Renal/GU ?negative Renal ROS  ?negative genitourinary ?  ?Musculoskeletal ? ? Abdominal ?  ?Peds ? Hematology ? ?(+) Blood dyscrasia, anemia ,   ?Anesthesia Other Findings ?a. 09/2020 Echo: EF 45%, mild LVH; b. 12/2020 Echo: EF 25-30%, glob HK, mod LVH, mildly reduced RV fxn, RVSP 65.7mmHg, mod BAE. Triv effusion. Mod MR/TR. ? Reproductive/Obstetrics ?negative OB ROS ? ?  ? ? ? ? ? ? ? ? ? ? ? ? ? ?  ?  ? ? ? ? ? ? ? ? ?Anesthesia Physical ? ?Anesthesia Plan ? ?ASA: 3 ? ?Anesthesia Plan: MAC  ? ?Post-op Pain Management:   ? ?Induction:  ? ?PONV Risk Score and Plan:  ? ?Airway Management Planned:  ? ?Additional Equipment:  ? ?Intra-op Plan:  ? ?Post-operative Plan:  ? ?Informed Consent: I have reviewed the patients History and Physical, chart, labs and discussed the procedure including the risks, benefits and alternatives for the proposed anesthesia with the patient or authorized representative who has indicated his/her understanding and acceptance.  ? ? ? ?Dental Advisory Given ? ?Plan Discussed with: CRNA ? ?Anesthesia Plan Comments:   ? ? ? ? ? ? ?Anesthesia Quick Evaluation ? ?

## 2021-05-12 NOTE — Interval H&P Note (Signed)
History and Physical Interval Note: ? ?05/12/2021 ?8:50 AM ? ?Allen Yu  has presented today for surgery, with the diagnosis of cataract hypermature age related; right.  The various methods of treatment have been discussed with the patient and family. After consideration of risks, benefits and other options for treatment, the patient has consented to  Procedure(s) with comments: ?CATARACT EXTRACTION PHACO AND INTRAOCULAR LENS PLACEMENT (IOC) (Right) - right as a surgical intervention.  The patient's history has been reviewed, patient examined, no change in status, stable for surgery.  I have reviewed the patient's chart and labs.  Questions were answered to the patient's satisfaction.   ? ? ?Baruch Goldmann ? ? ?

## 2021-05-12 NOTE — Anesthesia Postprocedure Evaluation (Signed)
Anesthesia Post Note ? ?Patient: Allen Yu ? ?Procedure(s) Performed: CATARACT EXTRACTION PHACO AND INTRAOCULAR LENS PLACEMENT (IOC) (Right: Eye) ? ?Patient location during evaluation: Short Stay ?Anesthesia Type: MAC ?Level of consciousness: awake and alert ?Pain management: pain level controlled ?Vital Signs Assessment: post-procedure vital signs reviewed and stable ?Respiratory status: spontaneous breathing ?Cardiovascular status: blood pressure returned to baseline and stable ?Postop Assessment: no apparent nausea or vomiting ?Anesthetic complications: no ? ? ?No notable events documented. ? ? ?Last Vitals:  ?Vitals:  ? 05/12/21 0806 05/12/21 0927  ?BP: (!) 112/45   ?Pulse: 76 72  ?Resp: 18 18  ?Temp: 36.8 ?C 36.8 ?C  ?SpO2: 100% 93%  ?  ?Last Pain:  ?Vitals:  ? 05/12/21 0927  ?TempSrc: Oral  ?PainSc: 5   ? ? ?  ?  ?  ?  ?  ?  ? ?Makita Blow ? ? ? ? ?

## 2021-05-15 ENCOUNTER — Encounter (HOSPITAL_COMMUNITY): Payer: Self-pay | Admitting: Ophthalmology

## 2021-05-16 ENCOUNTER — Ambulatory Visit (HOSPITAL_COMMUNITY): Payer: Medicare Other | Admitting: Physical Therapy

## 2021-05-16 ENCOUNTER — Ambulatory Visit (HOSPITAL_COMMUNITY)
Admission: RE | Admit: 2021-05-16 | Discharge: 2021-05-16 | Disposition: A | Discharge status: Home / Self Care | Payer: Medicare Other | Source: Ambulatory Visit | Attending: Internal Medicine | Admitting: Internal Medicine

## 2021-05-16 DIAGNOSIS — M6281 Muscle weakness (generalized): Secondary | ICD-10-CM

## 2021-05-16 DIAGNOSIS — J90 Pleural effusion, not elsewhere classified: Secondary | ICD-10-CM | POA: Diagnosis not present

## 2021-05-16 DIAGNOSIS — R262 Difficulty in walking, not elsewhere classified: Secondary | ICD-10-CM

## 2021-05-16 DIAGNOSIS — J189 Pneumonia, unspecified organism: Secondary | ICD-10-CM | POA: Insufficient documentation

## 2021-05-16 DIAGNOSIS — R2681 Unsteadiness on feet: Secondary | ICD-10-CM

## 2021-05-16 NOTE — Therapy (Signed)
?OUTPATIENT PHYSICAL THERAPY PROGRESS NOTE ? ? ?Patient Name: Allen Yu ?MRN: [id number] ?DOB:[birth year], 76 y.o., male ?Today's Date: 05/16/2021 ? ?PCP: [name], MD ?REFERRING PROVIDER: Nita Sickle, MD  ? ? ? ? PT End of Session - 05/16/21 1415   ? ? Visit Number 15   ? Number of Visits 26   ? Date for PT Re-Evaluation 06/22/21   ? Authorization Type Medicare Part A and B; Tricare for Life; sent request to [facility] for 12 more visits   ? Authorization - Visit Number 15   ? Authorization - Number of Visits 15   ? PT Start Time 3536   ? PT Stop Time 1420   ? PT Time Calculation (min) 17 min   ? Activity Tolerance Patient tolerated treatment well;Patient limited by fatigue   ? ?  ?  ? ?  ? ? ? ?Past Medical History:  ?Diagnosis Date  ? Anemia   ? Arthritis   ? Carotid stenosis   ? Chronic back pain   ? Chronic HFrEF (heart failure with reduced ejection fraction) ([place])   ? a. 09/2020 Echo: EF 45%, mild LVH; b. 12/2020 Echo: EF 25-30%, glob HK, mod LVH, mildly reduced RV fxn, RVSP 65.44mHg, mod BAE. Triv effusion. Mod MR/TR.  ? CKD (chronic kidney disease), stage III ([place]   ? Constipation   ? Dementia ([place]   ? Diabetes mellitus   ? Dilated cardiomyopathy ([place]   ? a. 09/2020 Echo: EF 45%; b. 12/2020 Echo: EF 25-30%.  ? GERD (gastroesophageal reflux disease)   ? History of kidney stones   ? Hypertension   ? Lung nodule   ? PAF (paroxysmal atrial fibrillation) ([place]   ? Peripheral neuropathy   ? Peripheral vascular disease ([place]   ? a. 06/2020 s/p L AKA; b. 10/2020 s/p R SFA and above-knee popliteal PTA.  ? PTSD (post-traumatic stress disorder)   ? Schizophrenia (HA[place]   ? Tuberculosis   ? Treated  ? ?Past Surgical History:  ?Procedure Laterality Date  ? ABDOMINAL AORTOGRAM W/LOWER EXTREMITY N/A 05/20/2020  ? Procedure: ABDOMINAL AORTOGRAM W/LOWER EXTREMITY;  Surgeon: FElam Dutch MD;  [facility];  Service: Cardiovascular;  Laterality: N/A;  ? ABDOMINAL AORTOGRAM W/LOWER EXTREMITY N/A 10/27/2020  ?  Procedure: ABDOMINAL AORTOGRAM W/LOWER EXTREMITY;  Surgeon: CMarty Heck MD;  [facility];  Service: Cardiovascular;  Laterality: N/A;  ? AMPUTATION Left 06/28/2020  ? Procedure: AMPUTATION ABOVE KNEE LEFT;  Surgeon: ERosetta Posner MD;  [facility];  Service: Vascular;  Laterality: Left;  ? APPENDECTOMY    ? BACK SURGERY  2000, 2013  ? x2  ? BLADDER SURGERY    ? 02  ? CATARACT EXTRACTION W/PHACO Right 05/12/2021  ? Procedure: CATARACT EXTRACTION PHACO AND INTRAOCULAR LENS PLACEMENT (IOC);  Surgeon: WBaruch Goldmann MD;  Location: [facility];  Service: Ophthalmology;  Laterality: Right;  CDE: 27.28  ? CHOLECYSTECTOMY    ? COLONOSCOPY    ? COLONOSCOPY N/A 12/07/2014  ? Procedure: COLONOSCOPY;  Surgeon: RDaneil Dolin MD;  [facility];  Service: Endoscopy;  Laterality: N/A;  10:30 Am  ? EYE SURGERY Bilateral   ? removed metal from eye  ? FEMORAL-TIBIAL BYPASS GRAFT Left 05/27/2020  ? Procedure: LEFT FEMORAL TO PERONEAL ARTERY BYPASS;  Surgeon: ERosetta Posner MD;  [facility]  Service: Vascular;  Laterality: Left;  ? HERNIA REPAIR Right   ? INGUINAL HERNIA REPAIR Left 11/03/2013  ?  Procedure: HERNIA REPAIR INGUINAL ADULT;  Surgeon: Gayland Curry, MD;  Location: [place];  Service: General;  Laterality: Left;  ? PERIPHERAL VASCULAR INTERVENTION  10/27/2020  ? Procedure: PERIPHERAL VASCULAR INTERVENTION;  Surgeon: Marty Heck, MD;  Location: [place] [facility];  Service: Cardiovascular;;  ? SHOULDER SURGERY    ? RIGHT SHOULDER   ? VIDEO BRONCHOSCOPY WITH ENDOBRONCHIAL NAVIGATION N/A 07/10/2019  ? Procedure: VIDEO BRONCHOSCOPY WITH ENDOBRONCHIAL NAVIGATION;  Surgeon: Melrose Nakayama, MD;  Location: [place];  Service: Thoracic;  Laterality: N/A;  ? ?Patient Active Problem List  ? Diagnosis Date Noted  ? Hypokalemia 04/13/2021  ? Colitis 04/12/2021  ? Bacteremia 04/11/2021  ? Pneumonia 04/11/2021  ? Chronic systolic CHF (congestive heart failure) ([place]) 04/11/2021  ? Acute  respiratory failure with hypoxia and hypercarbia ([place]) 01/23/2021  ? CKD (chronic kidney disease) stage 3, GFR 30-59 ml/min (HCC) 01/23/2021  ? Acute on chronic systolic CHF (congestive heart failure) ([place]) 01/23/2021  ? Acute respiratory failure with hypoxia ([place]) 01/13/2021  ? Encounter for fitting and adjustment of hearing aid 12/21/2020  ? Other specified problems related to psychosocial circumstances 11/21/2020  ? Sensorineural hearing loss, bilateral 11/21/2020  ? Tobacco dependence 10/06/2020  ? Benign prostatic hyperplasia with urinary obstruction 10/06/2020  ? Above knee amputation of left lower extremity ([place]) 10/06/2020  ? Other specified counseling 10/06/2020  ? Severe sepsis ([place]) 09/24/2020  ? Schizophrenia, unspecified ([place]) 09/24/2020  ? Unspecified dementia, unspecified severity, without behavioral disturbance, psychotic disturbance, mood disturbance, and anxiety ([place]) 09/24/2020  ? Lung nodule 09/24/2020  ? Indwelling Foley catheter present 09/24/2020  ? Peripheral vascular disease ([place]) 09/24/2020  ? Mass of testicle 09/24/2020  ? Problem related to unspecified psychosocial circumstances 08/01/2020  ? Hypermetropia 07/22/2020  ? Unspecified atrial fibrillation ([place]) 07/22/2020  ? Klebsiella sepsis ([place]) 07/22/2020  ? 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 SIRS (systemic inflammatory response syndrome) ([place]) 07/21/2020  ? Acute metabolic encephalopathy 31/49/7026  ? Hyponatremia   ? S/P AKA (above knee amputation) unilateral, left ([place]) 06/28/2020  ? Gangrene of left foot ([place]) 06/28/2020  ? 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 ([place]) 05/27/2020  ? Critical lower limb ischemia ([place]) 05/21/2020  ? Malnutrition ([place]) 05/19/2020  ? Plantar callus 12/24/2019  ? Cavitary pneumonia 09/14/2019  ? 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  ? Unintentional weight loss 09/14/2019  ? Coronary artery calcification seen on CT scan 10/28/2018  ? Hilar adenopathy 04/10/2016  ? Lung nodule seen  on imaging study 07/09/2015  ? Acute encephalopathy   ? Hypoglycemia due to insulin   ? Type 2 diabetes mellitus with other circulatory complications ([place]) 37/85/8850  ? Acute kidney injury superimposed on chronic kidney disease ([place]) 07/08/2015  ? Tobacco use disorder 07/08/2015  ? Diverticulosis of colon without hemorrhage   ? Left inguinal hernia 09/16/2013  ? Altered mental status 11/17/2012  ? Hypothermia 11/17/2012  ? Leukocytosis 11/17/2012  ? Chronic kidney disease, stage 3 unspecified ([place])   ? Post-traumatic stress disorder, chronic   ? GERD (gastroesophageal reflux disease)   ? Essential (primary) hypertension 09/10/2011  ? Anemia 09/10/2011  ? Diabetic neuropathy ([place]) 09/10/2011  ? DDD (degenerative disc disease), lumbar 09/10/2011  ? Psychosis ([place]) 09/10/2011  ? Diabetes mellitus type 2, uncontrolled 09/10/2011  ? Hyperlipemia 09/10/2011  ? Microalbuminuria 09/10/2011  ? ? ?REFERRING DIAG: Gen. weakness' left AKA ? ?THERAPY DIAG:  ?Muscle weakness (generalized) ? ?Difficulty in walking, not elsewhere classified ? ?Unsteadiness on feet ? ?PERTINENT  HISTORY: Lt AKA  ? ?PRECAUTIONS: Falls ? ?SUBJECTIVE: PT comes to department with aide and spouse.  Pt appears angry and does not appear to want to be here.   ? ?PAIN:  ?Are you having pain? Yes: wound not give rating just "I wouldn't be here if I didn't hurt" ? ? ? ?TODAY'S TREATMENT:   ? ?05/16/21 ?Transfer w/c to mat modified independently ?Supine: ?Rt SLR 2X5 with extension lag secondary to weakness ?Bridge with Lt LE on bolster 2X5 ?Sidelying Lt: Rt clam 5X5" ?Sidelying Lt: Rt hip abduction with knee bent 2X5 ? ? ?05/11/21: ?Hip flexion          Rt:  3/5      Lt    3/5        tested supine  ?Hip abduction     Rt:  2+/5;   LT   1/5       tested side lying  ?Hip extension     RT 1/5       LT  1/5        tested prone ?Knee extension  RT 3/5       n/a ?Knee flexion       RT 1/5       n/a  ?Supine:  SLR x 10 ?               Rt LE internal rotation x 5 ?                Rt leg bridge x 5 ?               AA RT leg hip abduction ? Prone :  AA RT LE hamstring curl x 10  ?               Glut set x 10  ?05/01/21 ?Re-eval today ? ?Bilat UE MMTs shoulder Flexion 4-/5 ?

## 2021-05-16 NOTE — Therapy (Deleted)
?[facility] ?[address] ?[place], [place], [address] ?Phone: [phone]   Fax:  [phone] ? ?Physical Therapy Treatment ? ?Patient Details  ?Name: Allen Yu ?MRN: [id number] ?Date of Birth: 12/21/1945 ?Referring Provider (PT): Nita Sickle, MD ? ? ?Encounter Date: 05/16/2021 ? ? PT End of Session - 05/16/21 1415   ? ? Visit Number 15   ? Number of Visits 26   ? Date for PT Re-Evaluation 06/22/21   ? Authorization Type Medicare Part A and B; Tricare for Life; sent request to [facility] for 12 more visits   ? Authorization - Visit Number 14   ? Authorization - Number of Visits 15   ? Activity Tolerance Patient tolerated treatment well;Patient limited by fatigue   ? ?  ?  ? ?  ? ? ?Past Medical History:  ?Diagnosis Date  ? Anemia   ? Arthritis   ? Carotid stenosis   ? Chronic back pain   ? Chronic HFrEF (heart failure with reduced ejection fraction) ([place])   ? a. 09/2020 Echo: EF 45%, mild LVH; b. 12/2020 Echo: EF 25-30%, glob HK, mod LVH, mildly reduced RV fxn, RVSP 65.20mHg, mod BAE. Triv effusion. Mod MR/TR.  ? CKD (chronic kidney disease), stage III ([facility]   ? Constipation   ? Dementia ([place]   ? Diabetes mellitus   ? Dilated cardiomyopathy ([place]   ? a. 09/2020 Echo: EF 45%; b. 12/2020 Echo: EF 25-30%.  ? GERD (gastroesophageal reflux disease)   ? History of kidney stones   ? Hypertension   ? Lung nodule   ? PAF (paroxysmal atrial fibrillation) ([place] Quarter   ? Peripheral neuropathy   ? Peripheral vascular disease ([place]   ? a. 06/2020 s/p L AKA; b. 10/2020 s/p R SFA and above-knee popliteal PTA.  ? PTSD (post-traumatic stress disorder)   ? Schizophrenia ([place]   ? Tuberculosis   ? Treated  ? ? ?Past Surgical History:  ?Procedure Laterality Date  ? ABDOMINAL AORTOGRAM W/LOWER EXTREMITY N/A 05/20/2020  ? Procedure: ABDOMINAL AORTOGRAM W/LOWER EXTREMITY;  Surgeon: FElam Dutch MD;  [facility];  Service: Cardiovascular;  Laterality: N/A;  ? ABDOMINAL AORTOGRAM  W/LOWER EXTREMITY N/A 10/27/2020  ? Procedure: ABDOMINAL AORTOGRAM W/LOWER EXTREMITY;  Surgeon: CMarty Heck MD;  [facility];  Service: Cardiovascular;  Laterality: N/A;  ? AMPUTATION Left 06/28/2020  ? Procedure: AMPUTATION ABOVE KNEE LEFT;  Surgeon: ERosetta Posner MD;  [facility];  Service: Vascular;  Laterality: Left;  ? APPENDECTOMY    ? BACK SURGERY  2000, 2013  ? x2  ? BLADDER SURGERY    ? 02  ? CATARACT EXTRACTION W/PHACO Right 05/12/2021  ? Procedure: CATARACT EXTRACTION PHACO AND INTRAOCULAR LENS PLACEMENT (IOC);  Surgeon: WBaruch Goldmann MD;  [facility];  Service: Ophthalmology;  Laterality: Right;  CDE: 27.28  ? CHOLECYSTECTOMY    ? COLONOSCOPY    ? COLONOSCOPY N/A 12/07/2014  ? Procedure: COLONOSCOPY;  Surgeon: RDaneil Dolin MD;  [facility];  Service: Endoscopy;  Laterality: N/A;  10:30 Am  ? EYE SURGERY Bilateral   ? removed metal from eye  ? FEMORAL-TIBIAL BYPASS GRAFT Left 05/27/2020  ? Procedure: LEFT FEMORAL TO PERONEAL ARTERY BYPASS;  Surgeon: ERosetta Posner MD;  [facility]  Service: Vascular;  Laterality: Left;  ? HERNIA REPAIR Right   ? INGUINAL HERNIA REPAIR Left 11/03/2013  ? Procedure: HERNIA REPAIR INGUINAL ADULT;  Surgeon:  Gayland Curry, MD;  Location: [facility];  Service: General;  Laterality: Left;  ? PERIPHERAL VASCULAR INTERVENTION  10/27/2020  ? Procedure: PERIPHERAL VASCULAR INTERVENTION;  Surgeon: Marty Heck, MD;  Location: [facility];  Service: Cardiovascular;;  ? SHOULDER SURGERY    ? RIGHT SHOULDER   ? VIDEO BRONCHOSCOPY WITH ENDOBRONCHIAL NAVIGATION N/A 07/10/2019  ? Procedure: VIDEO BRONCHOSCOPY WITH ENDOBRONCHIAL NAVIGATION;  Surgeon: Melrose Nakayama, MD;  Location: [place];  Service: Thoracic;  Laterality: N/A;  ? ? ?There were no vitals filed for this visit. ? ? ? ? ? ? ? ? ? ? ? ? ? ? ? ? ? ? ? ? ? ? ? ? ? ? ? ? ? ? ? ? PT Short Term Goals - 03/27/21 1332   ? ?  ? PT SHORT TERM GOAL #1  ? Title Demonstrate  bed mobility with modified independence to reduce level of assistance   ? Baseline min A   ? Time 6   ? Period Weeks   ? Status On-going   ? Target Date 03/09/21   ?  ? PT SHORT TERM GOAL #2  ? Title Sit to stand transfer with min A with RW to improve standing tolerance   ? Baseline min-mod A with HHA using RW   ? Time 6   ? Period Weeks   ? Status On-going   ? Target Date 03/09/21   ?  ? PT SHORT TERM GOAL #3  ? Title Squat-pivot transfer min A to improve safety with maneuvering in small spaces   ? Baseline min-mod A   ? Time 6   ? Period Weeks   ? Status On-going   ? Target Date 03/09/21   ?  ? PT SHORT TERM GOAL #4  ? Title Demo RLE 4/5 to prepare for gait/transfers   ? Baseline see MMT   ? Time 6   ? Period Weeks   ? Status On-going   ? Target Date 03/09/21   ? ?  ?  ? ?  ? ? ? ? PT Long Term Goals - 03/27/21 1333   ? ?  ? PT LONG TERM GOAL #1  ? Title Ambulate x 25 ft with RW with/without prosthetic to improve gait tolerance   ? Baseline Unable, has not been fit for prosthesis   ? Time 12   ? Period Weeks   ? Status On-going   ? Target Date 04/20/21   ?  ? PT LONG TERM GOAL #2  ? Title Stand-pivot transfer with supervision to increase functional independence   ? Baseline min-mod A   ? Time 12   ? Period Weeks   ? Status On-going   ? Target Date 04/20/21   ? ?  ?  ? ?  ? ? ? ? ? ? ? ? ? ?Patient will benefit from skilled therapeutic intervention in order to improve the following deficits and impairments:    ? ?Visit Diagnosis: ?Muscle weakness (generalized) ? ?Difficulty in walking, not elsewhere classified ? ?Unsteadiness on feet ? ? ? ? ?Problem List ?Patient Active Problem List  ? Diagnosis Date Noted  ? Hypokalemia 04/13/2021  ? Colitis 04/12/2021  ? Bacteremia 04/11/2021  ? Pneumonia 04/11/2021  ? Chronic systolic CHF (congestive heart failure) ([place]) 04/11/2021  ? Acute respiratory failure with hypoxia and hypercarbia ([place]) 01/23/2021  ? CKD (chronic kidney disease) stage 3, GFR 30-59 ml/min (HCC)  01/23/2021  ? Acute on chronic systolic CHF (  congestive heart failure) ([place]) 01/23/2021  ? Acute respiratory failure with hypoxia ([place]) 01/13/2021  ? Encounter for fitting and adjustment of hearing aid 12/21/2020  ? Other specified problems related to psychosocial circumstances 11/21/2020  ? Sensorineural hearing loss, bilateral 11/21/2020  ? Tobacco dependence 10/06/2020  ? Benign prostatic hyperplasia with urinary obstruction 10/06/2020  ? Above knee amputation of left lower extremity ([place]) 10/06/2020  ? Other specified counseling 10/06/2020  ? Severe sepsis ([place]) 09/24/2020  ? Schizophrenia, unspecified ([place]) 09/24/2020  ? Unspecified dementia, unspecified severity, without behavioral disturbance, psychotic disturbance, mood disturbance, and anxiety ([place]) 09/24/2020  ? Lung nodule 09/24/2020  ? Indwelling Foley catheter present 09/24/2020  ? Peripheral vascular disease ([place]) 09/24/2020  ? Mass of testicle 09/24/2020  ? Problem related to unspecified psychosocial circumstances 08/01/2020  ? Hypermetropia 07/22/2020  ? Unspecified atrial fibrillation ([place]) 07/22/2020  ? Klebsiella sepsis ([place]) 07/22/2020  ? 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 SIRS (systemic inflammatory response syndrome) ([place]) 07/21/2020  ? Acute metabolic encephalopathy 93/57/0177  ? Hyponatremia   ? S/P AKA (above knee amputation) unilateral, left ([place]) 06/28/2020  ? Gangrene of left foot ([place]) 06/28/2020  ? 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 Critical lower limb ischemia ([place]) 05/21/2020  ? Malnutrition ([place]) 05/19/2020  ? Plantar callus 12/24/2019  ? Cavitary pneumonia 09/14/2019  ? 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  ? Unintentional weight loss 09/14/2019  ? Coronary artery calcification seen on CT scan 10/28/2018  ? Hilar adenopathy 04/10/2016  ? Lung nodule seen on imaging study 07/09/2015  ? Acute encephalopathy   ? Hypoglycemia due to insulin   ? Type 2 diabetes mellitus with other  circulatory complications ([place]) 93/90/3009  ? Acute kidney injury superimposed on chronic kidney disease ([place]) 07/08/2015  ? Tobacco use disorder 07/08/2015  ? Diverticulosis of colon without hemorrhage   ? Left inguinal h

## 2021-05-17 ENCOUNTER — Telehealth (HOSPITAL_COMMUNITY): Payer: Self-pay | Admitting: Physical Therapy

## 2021-05-17 NOTE — Telephone Encounter (Signed)
Patient requested to be on hold until his wife calls back to start PT again per LT. ?

## 2021-05-17 NOTE — Telephone Encounter (Signed)
Requested more visits to be approved by the [facility] to continue care for PT. Waiting for turn phone call to see what they need to get more approved. ?

## 2021-05-19 ENCOUNTER — Encounter (HOSPITAL_COMMUNITY): Payer: TRICARE For Life (TFL) | Admitting: Physical Therapy

## 2021-05-22 ENCOUNTER — Encounter (HOSPITAL_COMMUNITY): Payer: TRICARE For Life (TFL) | Admitting: Physical Therapy

## 2021-05-22 DIAGNOSIS — H25812 Combined forms of age-related cataract, left eye: Secondary | ICD-10-CM | POA: Diagnosis not present

## 2021-05-22 NOTE — H&P (Signed)
Surgical History & Physical ? ?Patient Name: Allen Yu DOB: 1945/04/24 ? ?Surgery: Cataract extraction with intraocular lens implant phacoemulsification; Left Eye ? ?Surgeon: Baruch Goldmann MD ?Surgery Date:  05-29-21 ?Pre-Op Date:  05-22-21 ? ?HPI: ?A 64 Yr. old male patient 1. The patient is returning after cataract post-op. The right eye is affected. Status post cataract post-op, which began 1 year ago: Since the last visit, the affected area is doing well. The patient's vision seems not good per pt.. Patient is following medication instructions. 2. 2. The patient complains of difficulty when viewing TV, reading closed caption, news scrolls on TV, which began many years ago. The left eye is affected. The episode is gradual. The condition's severity increased since last visit. Symptoms occur when the patient is inside and outside. This is negatively affecting the patient's quality of life and the patient is unable to function adequately in life with the current level of vision. HPI was performed by Baruch Goldmann . ? ?Medical History: ?Diabetes - DM Type 2 ?Heart Problem ?LDL ?Schizophrenia, Amputaded above knee, Anemia, BPH, ... ? ?Review of Systems ?Negative Allergic/Immunologic ?Negative Cardiovascular ?Negative Constitutional ?Negative Ear, Nose, Mouth & Throat ?Negative Endocrine ?Negative Eyes ?Negative Gastrointestinal ?Negative Genitourinary ?Negative Hemotologic/Lymphatic ?Negative Integumentary ?Negative Musculoskeletal ?Negative Neurological ?Negative Psychiatry ?Negative Respiratory ? ?Social ?  Current every day smoker  ? ?Medication ?Prednisolone-Moxifloxacin-Bromfenac,  ?Acetaminophen, Losartan, Metformin, Donepezil, Atorvastatin, Metoprolol, Risperidone, Mirtazapine, Tramadol hydrochloride, DOXYCYCLINE hyc,  ? ?Sx/Procedures ?Phaco c IOL OD,  ?Amputation above knee, Bladder catheter, Leg artery balloon, Back Surgery,  ? ?Drug Allergies  ?Propoxyphen, Diovan, Codeine,  ? ?History &  Physical: ?Heent: Cataract, left eye ?NECK: supple without bruits ?LUNGS: lungs clear to auscultation ?CV: regular rate and rhythm ?Abdomen: soft and non-tender ?Impression & Plan: ?Assessment: ?1.  CATARACT EXTRACTION STATUS; Right Eye (Z98.41) ?2.  COMBINED FORMS AGE RELATED CATARACT; Left Eye [phone]) ? ?Plan: 1.  1 week after cataract surgery. Doing well with improved vision and normal eye pressure. Call with any problems or concerns. ?Continue Pred-Moxi-Brom 2x/day for 2 more weeks. ? ?2.  Cataract accounts for the patient's decreased vision. This visual impairment is not correctable with a tolerable change in glasses or contact lenses. Cataract surgery with an implantation of a new lens should significantly improve the visual and functional status of the patient. Discussed all risks, benefits, alternatives, and potential complications. Discussed the procedures and recovery. Patient desires to have surgery. A-scan ordered and performed today for intra-ocular lens calculations. The surgery will be performed in order to improve vision for driving, reading, and for eye examinations. Recommend phacoemulsification with intra-ocular lens. Recommend Dextenza for post-operative pain and inflammation. ?Left Eye. ?Surgery required to correct imbalance of vision. ?Dilates poorly - shugarcaine by protocol. ?Malyugin Ring. ?[place]. ?

## 2021-05-23 ENCOUNTER — Encounter (HOSPITAL_COMMUNITY): Payer: Self-pay

## 2021-05-23 ENCOUNTER — Encounter (HOSPITAL_COMMUNITY)
Admission: RE | Admit: 2021-05-23 | Discharge: 2021-05-23 | Disposition: A | Discharge status: Home / Self Care | Payer: Medicare Other | Source: Ambulatory Visit | Attending: Ophthalmology | Admitting: Ophthalmology

## 2021-05-23 ENCOUNTER — Other Ambulatory Visit: Payer: Self-pay

## 2021-05-23 ENCOUNTER — Ambulatory Visit (INDEPENDENT_AMBULATORY_CARE_PROVIDER_SITE_OTHER): Payer: Medicare Other | Admitting: Physician Assistant

## 2021-05-23 VITALS — BP 124/73 | HR 85 | Temp 98.6°F | Resp 18 | Ht 73.0 in | Wt 118.0 lb

## 2021-05-23 DIAGNOSIS — I739 Peripheral vascular disease, unspecified: Secondary | ICD-10-CM | POA: Diagnosis not present

## 2021-05-23 HISTORY — DX: Foot drop, right foot: M21.371

## 2021-05-23 NOTE — Pre-Procedure Instructions (Signed)
Patients wife, [name] states that patient is Left AKA and has foot drop on the right foot  and lower leg which has healing ulcers that are wrapped. She wants [place] to please be careful with this foot. I placed a note on the front of his chart t and highlighted it to alert all staff dealing with him about this. ?

## 2021-05-23 NOTE — Progress Notes (Signed)
?POST OPERATIVE OFFICE NOTE ? ? ? ?CC:  F/u for surgery ? ?HPI:  This is a 76 y.o. male who is here for wound checks.  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  The left AKA has healed.  He recently presented with right LE blisters and superficial wounds.  He underwent angiogram with Right peroneal and TP trunk angioplasty and Right SFA and above-knee popliteal angioplasty.  The procedure was stopped early due to the patient inability to follow instruction and hold still.  He has dementia. ? His wife cares for him and performs diligent care to his wounds.  He can stand pivot on the right LE per his wife, but he is not ambulatory.  He was placed on dual antiplatelets with ASA, Plavix, and Statin.   ? ? ?Allergies  ?Allergen Reactions  ? Bee Venom Anaphylaxis  ? Codeine Other (See Comments)  ?  incoherent  ?Other reaction(s): Delirium  ? Propoxyphene Other (See Comments)  ?  Dizziness, "Makes me feel drunk" ?Other reaction(s): Dizziness  ? Valsartan Other (See Comments)  ?  incoherent ?Other reaction(s): Delirium  ? ? ?Current Outpatient Medications  ?Medication Sig Dispense Refill  ? acetaminophen (TYLENOL) 500 MG tablet Take 500 mg by mouth 2 (two) times daily as needed for moderate pain.    ? Alcohol Swabs (ALCOHOL PADS) 70 % PADS USE 1 PAD AS DIRECTED    ? aspirin EC 81 MG tablet Take 1 tablet (81 mg total) by mouth daily with breakfast. 30 tablet 2  ? atorvastatin (LIPITOR) 40 MG tablet Take 40 mg by mouth daily.    ? B-D UF III MINI PEN NEEDLES 31G X 5 MM MISC SMARTSIG:1 Each SUB-Q Daily    ? clopidogrel (PLAVIX) 75 MG tablet Take 1 tablet (75 mg total) by mouth daily. 30 tablet 11  ? Dextromethorphan-GG-APAP (CORICIDIN HBP COLD/COUGH/FLU) 10-200-325 MG/15ML LIQD Take 30 mLs by mouth as needed (cough).    ? donepezil (ARICEPT) 10 MG tablet Take 10 mg by mouth daily.    ? doxycycline (VIBRAMYCIN) 100 MG capsule  Take 100 mg by mouth 2 (two) times daily.    ? feeding supplement, ENSURE COMPLETE, (ENSURE COMPLETE) LIQD Take 237 mLs by mouth 2 (two) times daily between meals. 23700 mL 1  ? ferrous sulfate 325 (65 FE) MG tablet Take 325 mg by mouth daily with breakfast.    ? gabapentin (NEURONTIN) 100 MG capsule Take 100 mg by mouth daily.    ? LANTUS 100 UNIT/ML injection Inject 0.06 mLs (6 Units total) into the skin at bedtime as needed (High blood glucose). If Blood glucose over 200 (Patient taking differently: Inject 15 Units into the skin daily.) 10 mL 11  ? losartan (COZAAR) 50 MG tablet Take 1 tablet (50 mg total) by mouth daily. (Patient taking differently: Take 100 mg by mouth daily.) 90 tablet 3  ? metoprolol succinate (TOPROL-XL) 50 MG 24 hr tablet Take 50 mg by mouth daily. Take with or immediately following a meal.    ? Multiple Vitamins-Minerals (MULTIVITAMIN WITH MINERALS) tablet Take 1 tablet by mouth daily. 120 tablet 2  ? omeprazole (PRILOSEC) 20 MG capsule Take 1 capsule by mouth daily.    ? ondansetron (ZOFRAN) 4 MG tablet Take 1 tablet (4 mg total) by mouth every 6 (six) hours as needed for nausea. 20 tablet 0  ? risperiDONE (RISPERDAL) 0.5 MG  tablet Take 1 tablet (0.5 mg total) by mouth every 12 (twelve) hours as needed (Restlessness and agitation). 60 tablet 1  ? torsemide (DEMADEX) 20 MG tablet Take 1.5 tablets (30 mg total) by mouth daily. (Patient taking differently: Take 10 mg by mouth daily. Take 10 mg daily) 45 tablet 3  ? traMADol (ULTRAM) 50 MG tablet Take 50 mg by mouth every 6 (six) hours as needed for moderate pain.    ? Vitamin D, Ergocalciferol, (DRISDOL) 1.25 MG (50000 UNIT) CAPS capsule Take 50,000 Units by mouth every Friday.    ? metoprolol succinate (TOPROL-XL) 50 MG 24 hr tablet Take 1 tablet (50 mg total) by mouth daily. Take with or immediately following a meal. 90 tablet 3  ? ?No current facility-administered medications for this visit.  ? ? ? ROS:  See HPI ? ?Physical  Exam: ? ? ? ? ?No acute distress ?Lungs non labored breathing ?Left LE muscle waisting, compartments soft, mild foot edema ?Non healing wounds without purulent drainage. ? ? ?Assessment/Plan:  This is a 76 y.o. male who is s/p:Right peroneal and TP trunk angioplasty and Right SFA and above-knee popliteal angioplasty.   ? He and his family will continue to care for his wounds.  He has no re vascularization options.  If he develops worsening wounds, fever or chills that lead to infection his only option will be primary amputation.   ? His wife understands and wishes to continue wound care.  He will f/u as needed. ? ? ?Laurence Slate PAC ?[facility] ?[phone] ? ? ?Clinic MD:  Carlis Abbott ?

## 2021-05-25 ENCOUNTER — Telehealth (HOSPITAL_COMMUNITY): Payer: Self-pay | Admitting: Occupational Therapy

## 2021-05-25 ENCOUNTER — Encounter (HOSPITAL_COMMUNITY): Payer: TRICARE For Life (TFL) | Admitting: Physical Therapy

## 2021-05-25 NOTE — Telephone Encounter (Signed)
Scanned  PT Auth#[id number] in to chart expired 09/20/21- LT is aware and will notify patient ?

## 2021-05-26 ENCOUNTER — Ambulatory Visit (INDEPENDENT_AMBULATORY_CARE_PROVIDER_SITE_OTHER): Payer: Medicare Other | Admitting: Student

## 2021-05-26 ENCOUNTER — Encounter: Payer: Self-pay | Admitting: Student

## 2021-05-26 VITALS — BP 128/68 | HR 82 | Ht 73.0 in | Wt 120.0 lb

## 2021-05-26 DIAGNOSIS — I5020 Unspecified systolic (congestive) heart failure: Secondary | ICD-10-CM | POA: Diagnosis not present

## 2021-05-26 DIAGNOSIS — I10 Essential (primary) hypertension: Secondary | ICD-10-CM

## 2021-05-26 DIAGNOSIS — N1831 Chronic kidney disease, stage 3a: Secondary | ICD-10-CM | POA: Diagnosis not present

## 2021-05-26 DIAGNOSIS — I739 Peripheral vascular disease, unspecified: Secondary | ICD-10-CM | POA: Diagnosis not present

## 2021-05-26 DIAGNOSIS — I480 Paroxysmal atrial fibrillation: Secondary | ICD-10-CM

## 2021-05-26 NOTE — Progress Notes (Signed)
? ?Cardiology Office Note   ? ?Date:  05/26/2021  ? ?ID:  Allen Yu, DOB 08-18-1945, MRN [id number] ? ?PCP:  [name], MD  ?Cardiologist: Rozann Lesches, MD   ? ?Chief Complaint  ?Patient presents with  ? Follow-up  ?  3 month visit  ? ? ?History of Present Illness:   ? ?Allen Yu is a 76 y.o. male with past medical history of PAD (s/p L AKA in 06/2020), persistent atrial fibrillation (diagnosed in 07/2020 and converted to NSR with IV Amiodarone, recurrence in 09/2020), HFrEF (EF 45% in 09/2020, at 25-30% in 12/2020), HTN, Type 2 DM, PTSD, Schizophrenia and dementia who presents to the office today for 54-monthfollow-up. ? ?He was last examined by myself in 02/2021 following a recent admission for acute hypoxic respiratory failure in the setting of a CHF exacerbation. His wife provided most of the history at the time of his visit and she reported that his breathing had been stable and they had been trying to monitor his fluid intake. He was continued on Torsemide 30 mg daily, Toprol-XL 50 mg daily and Losartan 25 mg daily as his BP did [facility] not started on SGLT2 inhibitor given his malnutrition. His wife did call the office in 03/2021 reporting that his BP had been elevated, therefore Losartan was titrated to 50 mg daily. ? ?[facility] E. coli bacteremia and colitis and was treated with IV antibiotics during admission and discharged on Augmentin and Flagyl at discharge. Was also found to have pneumonia during admission and appropriately treated with antibiotic therapy for this as well. His creatinine did peak at 3.44 during admission and this had improved to 1.75 at the time of hospital discharge.  He received IV fluids during admission and ultimately required a thoracentesis to assist with fluid removal.  He was restarted on Torsemide 30 mg daily at discharge. ? ?In talking with the patient and his wife today, most  history is provided by his wife. His breathing has been stable with no recent orthopnea, PND or pitting edema. No reported chest pain or palpitations. She does report his heart rate is variable at home but he is asymptomatic with this. He has developed some open sores along his right leg and they have been following recommendations from [facility] and cleaning these appropriately. ? ?Past Medical History:  ?Diagnosis Date  ? Anemia   ? Arthritis   ? Carotid stenosis   ? Chronic back pain   ? Chronic HFrEF (heart failure with reduced ejection fraction) ([place]   ? a. 09/2020 Echo: EF 45%, mild LVH; b. 12/2020 Echo: EF 25-30%, glob HK, mod LVH, mildly reduced RV fxn, RVSP 65.773mg, mod BAE. Triv effusion. Mod MR/TR.  ? CKD (chronic kidney disease), stage III ([facility]  ? Constipation   ? Dementia ([place]  ? Diabetes mellitus   ? Dilated cardiomyopathy ([facility]  ? a. 09/2020 Echo: EF 45%; b. 12/2020 Echo: EF 25-30%.  ? Foot drop, right   ? GERD (gastroesophageal reflux disease)   ? History of kidney stones   ? Hypertension   ? Lung nodule   ? PAF (paroxysmal atrial fibrillation) ([facility]  ? Peripheral neuropathy   ? Peripheral vascular disease ([place]  ? a. 06/2020 s/p L AKA; b. 10/2020 s/p R SFA and above-knee popliteal PTA.  ? PTSD (post-traumatic stress disorder)   ?  Schizophrenia ([place])   ? Tuberculosis   ? Treated  ? ? ?Past Surgical History:  ?Procedure Laterality Date  ? ABDOMINAL AORTOGRAM W/LOWER EXTREMITY N/A 05/20/2020  ? Procedure: ABDOMINAL AORTOGRAM W/LOWER EXTREMITY;  Surgeon: Elam Dutch, MD;  Location: [place] [facility];  Service: Cardiovascular;  Laterality: N/A;  ? ABDOMINAL AORTOGRAM W/LOWER EXTREMITY N/A 10/27/2020  ? Procedure: ABDOMINAL AORTOGRAM W/LOWER EXTREMITY;  Surgeon: Marty Heck, MD;  Location: [place] [facility];  Service: Cardiovascular;  Laterality: N/A;  ? AMPUTATION Left 06/28/2020  ? Procedure: AMPUTATION ABOVE KNEE LEFT;  Surgeon: Rosetta Posner, MD;  Location: [facility]  OR;  Service: Vascular;  Laterality: Left;  ? APPENDECTOMY    ? BACK SURGERY  2000, 2013  ? x2  ? BLADDER SURGERY    ? 02  ? CATARACT EXTRACTION W/PHACO Right 05/12/2021  ? Procedure: CATARACT EXTRACTION PHACO AND INTRAOCULAR LENS PLACEMENT (IOC);  Surgeon: Baruch Goldmann, MD;  [facility];  Service: Ophthalmology;  Laterality: Right;  CDE: 27.28  ? CHOLECYSTECTOMY    ? COLONOSCOPY    ? COLONOSCOPY N/A 12/07/2014  ? Procedure: COLONOSCOPY;  Surgeon: Daneil Dolin, MD;  [facility];  Service: Endoscopy;  Laterality: N/A;  10:30 Am  ? EYE SURGERY Bilateral   ? removed metal from eye  ? FEMORAL-TIBIAL BYPASS GRAFT Left 05/27/2020  ? Procedure: LEFT FEMORAL TO PERONEAL ARTERY BYPASS;  Surgeon: Rosetta Posner, MD;  Location: [place];  Service: Vascular;  Laterality: Left;  ? HERNIA REPAIR Right   ? INGUINAL HERNIA REPAIR Left 11/03/2013  ? Procedure: HERNIA REPAIR INGUINAL ADULT;  Surgeon: Gayland Curry, MD;  Location: [place];  Service: General;  Laterality: Left;  ? PERIPHERAL VASCULAR INTERVENTION  10/27/2020  ? Procedure: PERIPHERAL VASCULAR INTERVENTION;  Surgeon: Marty Heck, MD;  Location: [place] [facility];  Service: Cardiovascular;;  ? SHOULDER SURGERY    ? RIGHT SHOULDER   ? VIDEO BRONCHOSCOPY WITH ENDOBRONCHIAL NAVIGATION N/A 07/10/2019  ? Procedure: VIDEO BRONCHOSCOPY WITH ENDOBRONCHIAL NAVIGATION;  Surgeon: Melrose Nakayama, MD;  Location: [place];  Service: Thoracic;  Laterality: N/A;  ? ? ?Current Medications: ?Outpatient Medications Prior to Visit  ?Medication Sig Dispense Refill  ? acetaminophen (TYLENOL) 500 MG tablet Take 500 mg by mouth 2 (two) times daily as needed for moderate pain.    ? Alcohol Swabs (ALCOHOL PADS) 70 % PADS USE 1 PAD AS DIRECTED    ? aspirin EC 81 MG tablet Take 1 tablet (81 mg total) by mouth daily with breakfast. 30 tablet 2  ? atorvastatin (LIPITOR) 40 MG tablet Take 40 mg by mouth daily.    ? B-D UF III MINI PEN NEEDLES 31G X 5 MM MISC SMARTSIG:1 Each  SUB-Q Daily    ? clopidogrel (PLAVIX) 75 MG tablet Take 1 tablet (75 mg total) by mouth daily. 30 tablet 11  ? Dextromethorphan-GG-APAP (CORICIDIN HBP COLD/COUGH/FLU) 10-200-325 MG/15ML LIQD Take 30 mLs by mouth as needed (cough).    ? donepezil (ARICEPT) 10 MG tablet Take 10 mg by mouth daily.    ? doxycycline (VIBRAMYCIN) 100 MG capsule Take 100 mg by mouth 2 (two) times daily.    ? feeding supplement, ENSURE COMPLETE, (ENSURE COMPLETE) LIQD Take 237 mLs by mouth 2 (two) times daily between meals. 23700 mL 1  ? ferrous sulfate 325 (65 FE) MG tablet Take 325 mg by mouth daily with breakfast.    ? gabapentin (NEURONTIN) 100 MG capsule Take 100 mg by  mouth daily.    ? LANTUS 100 UNIT/ML injection Inject 0.06 mLs (6 Units total) into the skin at bedtime as needed (High blood glucose). If Blood glucose over 200 (Patient taking differently: Inject 15 Units into the skin daily.) 10 mL 11  ? losartan (COZAAR) 50 MG tablet Take 1 tablet (50 mg total) by mouth daily. (Patient taking differently: Take 100 mg by mouth daily.) 90 tablet 3  ? metoprolol succinate (TOPROL-XL) 50 MG 24 hr tablet Take 1 tablet (50 mg total) by mouth daily. Take with or immediately following a meal. 90 tablet 3  ? Multiple Vitamins-Minerals (MULTIVITAMIN WITH MINERALS) tablet Take 1 tablet by mouth daily. 120 tablet 2  ? omeprazole (PRILOSEC) 20 MG capsule Take 1 capsule by mouth daily.    ? ondansetron (ZOFRAN) 4 MG tablet Take 1 tablet (4 mg total) by mouth every 6 (six) hours as needed for nausea. 20 tablet 0  ? risperiDONE (RISPERDAL) 0.5 MG tablet Take 1 tablet (0.5 mg total) by mouth every 12 (twelve) hours as needed (Restlessness and agitation). 60 tablet 1  ? torsemide (DEMADEX) 20 MG tablet Take 1.5 tablets (30 mg total) by mouth daily. (Patient taking differently: Take 10 mg by mouth daily. Take 10 mg daily) 45 tablet 3  ? traMADol (ULTRAM) 50 MG tablet Take 50 mg by mouth every 6 (six) hours as needed for moderate pain.    ? Vitamin D,  Ergocalciferol, (DRISDOL) 1.25 MG (50000 UNIT) CAPS capsule Take 50,000 Units by mouth every Friday.    ? metoprolol succinate (TOPROL-XL) 50 MG 24 hr tablet Take 50 mg by mouth daily. Take with or immediat

## 2021-05-26 NOTE — Patient Instructions (Signed)
Medication Instructions:  Your physician recommends that you continue on your current medications as directed. Please refer to the Current Medication list given to you today.   Labwork: None today  Testing/Procedures: None today  Follow-Up: 6 months  Any Other Special Instructions Will Be Listed Below (If Applicable).  If you need a refill on your cardiac medications before your next appointment, please call your pharmacy.  

## 2021-05-29 ENCOUNTER — Ambulatory Visit (HOSPITAL_BASED_OUTPATIENT_CLINIC_OR_DEPARTMENT_OTHER): Payer: Medicare Other | Admitting: Certified Registered Nurse Anesthetist

## 2021-05-29 ENCOUNTER — Ambulatory Visit (HOSPITAL_COMMUNITY)
Admission: RE | Admit: 2021-05-29 | Discharge: 2021-05-29 | Disposition: A | Discharge status: Home / Self Care | Payer: Medicare Other | Attending: Ophthalmology | Admitting: Ophthalmology

## 2021-05-29 ENCOUNTER — Ambulatory Visit (HOSPITAL_COMMUNITY): Payer: Medicare Other | Admitting: Certified Registered Nurse Anesthetist

## 2021-05-29 ENCOUNTER — Encounter (HOSPITAL_COMMUNITY)
Admission: RE | Disposition: A | Discharge status: Home / Self Care | Payer: Self-pay | Source: Home / Self Care | Attending: Ophthalmology

## 2021-05-29 ENCOUNTER — Encounter (HOSPITAL_COMMUNITY): Payer: Self-pay | Admitting: Ophthalmology

## 2021-05-29 DIAGNOSIS — F0390 Unspecified dementia without behavioral disturbance: Secondary | ICD-10-CM | POA: Diagnosis not present

## 2021-05-29 DIAGNOSIS — I509 Heart failure, unspecified: Secondary | ICD-10-CM | POA: Diagnosis not present

## 2021-05-29 DIAGNOSIS — K219 Gastro-esophageal reflux disease without esophagitis: Secondary | ICD-10-CM | POA: Diagnosis not present

## 2021-05-29 DIAGNOSIS — E1136 Type 2 diabetes mellitus with diabetic cataract: Secondary | ICD-10-CM | POA: Insufficient documentation

## 2021-05-29 DIAGNOSIS — F17210 Nicotine dependence, cigarettes, uncomplicated: Secondary | ICD-10-CM | POA: Diagnosis not present

## 2021-05-29 DIAGNOSIS — H25812 Combined forms of age-related cataract, left eye: Secondary | ICD-10-CM | POA: Insufficient documentation

## 2021-05-29 DIAGNOSIS — H2181 Floppy iris syndrome: Secondary | ICD-10-CM

## 2021-05-29 DIAGNOSIS — H2512 Age-related nuclear cataract, left eye: Secondary | ICD-10-CM

## 2021-05-29 DIAGNOSIS — I110 Hypertensive heart disease with heart failure: Secondary | ICD-10-CM | POA: Insufficient documentation

## 2021-05-29 DIAGNOSIS — F17200 Nicotine dependence, unspecified, uncomplicated: Secondary | ICD-10-CM | POA: Insufficient documentation

## 2021-05-29 DIAGNOSIS — I2510 Atherosclerotic heart disease of native coronary artery without angina pectoris: Secondary | ICD-10-CM | POA: Diagnosis not present

## 2021-05-29 HISTORY — PX: CATARACT EXTRACTION W/PHACO: SHX586

## 2021-05-29 SURGERY — PHACOEMULSIFICATION, CATARACT, WITH IOL INSERTION
Anesthesia: Monitor Anesthesia Care | Site: Eye | Laterality: Left

## 2021-05-29 MED ORDER — PHENYLEPHRINE-KETOROLAC 1-0.3 % IO SOLN
INTRAOCULAR | Status: DC | PRN
  Administered 2021-05-29: 500 mL via OPHTHALMIC

## 2021-05-29 MED ORDER — EPINEPHRINE PF 1 MG/ML IJ SOLN
INTRAMUSCULAR | Status: AC
Start: 2021-05-29 — End: ?
  Filled 2021-05-29: qty 1

## 2021-05-29 MED ORDER — PHENYLEPHRINE-KETOROLAC 1-0.3 % IO SOLN
INTRAOCULAR | Status: AC
  Filled 2021-05-29: qty 4

## 2021-05-29 MED ORDER — TROPICAMIDE 1 % OP SOLN
1.0000 [drp] | OPHTHALMIC | Status: AC | PRN
  Administered 2021-05-29 (×3): 1 [drp] via OPHTHALMIC

## 2021-05-29 MED ORDER — SODIUM HYALURONATE 23MG/ML IO SOSY
PREFILLED_SYRINGE | INTRAOCULAR | Status: DC | PRN
  Administered 2021-05-29: 0.6 mL via INTRAOCULAR

## 2021-05-29 MED ORDER — POVIDONE-IODINE 5 % OP SOLN
OPHTHALMIC | Status: DC | PRN
  Administered 2021-05-29: 1 via OPHTHALMIC

## 2021-05-29 MED ORDER — TETRACAINE HCL 0.5 % OP SOLN
1.0000 [drp] | OPHTHALMIC | Status: AC | PRN
  Administered 2021-05-29 (×3): 1 [drp] via OPHTHALMIC

## 2021-05-29 MED ORDER — EPINEPHRINE PF 1 MG/ML IJ SOLN
INTRAOCULAR | Status: DC | PRN
  Administered 2021-05-29: 1 mL via OPHTHALMIC

## 2021-05-29 MED ORDER — NEOMYCIN-POLYMYXIN-DEXAMETH 3.5-10000-0.1 OP SUSP
OPHTHALMIC | Status: DC | PRN
  Administered 2021-05-29: 1 [drp] via OPHTHALMIC

## 2021-05-29 MED ORDER — LACTATED RINGERS IV SOLN: INTRAVENOUS | Status: DC

## 2021-05-29 MED ORDER — LIDOCAINE HCL 3.5 % OP GEL
1.0000 "application " | Freq: Once | OPHTHALMIC | Status: AC
  Administered 2021-05-29: 1 via OPHTHALMIC

## 2021-05-29 MED ORDER — STERILE WATER FOR IRRIGATION IR SOLN
Status: DC | PRN
  Administered 2021-05-29: 250 mL

## 2021-05-29 MED ORDER — PHENYLEPHRINE HCL 2.5 % OP SOLN
1.0000 [drp] | OPHTHALMIC | Status: AC | PRN
  Administered 2021-05-29 (×3): 1 [drp] via OPHTHALMIC

## 2021-05-29 MED ORDER — SODIUM HYALURONATE 10 MG/ML IO SOLUTION
PREFILLED_SYRINGE | INTRAOCULAR | Status: DC | PRN
  Administered 2021-05-29: 0.85 mL via INTRAOCULAR

## 2021-05-29 MED ORDER — BSS IO SOLN
INTRAOCULAR | Status: DC | PRN
  Administered 2021-05-29: 15 mL via INTRAOCULAR

## 2021-05-29 MED ORDER — TRYPAN BLUE 0.06 % IO SOSY
PREFILLED_SYRINGE | INTRAOCULAR | Status: AC
Start: 2021-05-29 — End: ?
  Filled 2021-05-29: qty 0.5

## 2021-05-29 SURGICAL SUPPLY — 14 items
CATARACT SUITE SIGHTPATH (MISCELLANEOUS) ×2 IMPLANT
CLOTH BEACON ORANGE TIMEOUT ST (SAFETY) ×2 IMPLANT
EYE SHIELD UNIVERSAL CLEAR (GAUZE/BANDAGES/DRESSINGS) ×1 IMPLANT
FEE CATARACT SUITE SIGHTPATH (MISCELLANEOUS) ×1 IMPLANT
GLOVE BIOGEL PI IND STRL 7.0 (GLOVE) ×2 IMPLANT
GLOVE BIOGEL PI INDICATOR 7.0 (GLOVE) ×2
LENS IOL RAYNER 19.5 (Intraocular Lens) ×2 IMPLANT
LENS IOL RAYONE EMV 19.5 (Intraocular Lens) IMPLANT
PAD ARMBOARD 7.5X6 YLW CONV (MISCELLANEOUS) ×2 IMPLANT
RING MALYGIN 7.0 (MISCELLANEOUS) ×1 IMPLANT
SYR TB 1ML LL NO SAFETY (SYRINGE) ×2 IMPLANT
TAPE SURG TRANSPORE 1 IN (GAUZE/BANDAGES/DRESSINGS) IMPLANT
TAPE SURGICAL TRANSPORE 1 IN (GAUZE/BANDAGES/DRESSINGS) ×2
WATER STERILE IRR 250ML POUR (IV SOLUTION) ×2 IMPLANT

## 2021-05-29 NOTE — Anesthesia Preprocedure Evaluation (Signed)
Anesthesia Evaluation  ?Patient identified by MRN, date of birth, ID band ?Patient awake ? ? ? ?Reviewed: ?Allergy & Precautions, H&P , NPO status , Patient's Chart, lab work & pertinent test results, reviewed documented beta blocker date and time  ? ?Airway ?Mallampati: II ? ?TM Distance: >3 FB ?Neck ROM: full ? ? ? Dental ?no notable dental hx. ? ?  ?Pulmonary ?neg pulmonary ROS, Current Smoker,  ?  ?Pulmonary exam normal ?breath sounds clear to auscultation ? ? ? ? ? ? Cardiovascular ?Exercise Tolerance: Good ?hypertension, + CAD, + Peripheral Vascular Disease and +CHF  ? ?Rhythm:regular Rate:Normal ? ? ?  ?Neuro/Psych ?PSYCHIATRIC DISORDERS Anxiety Schizophrenia Dementia  Neuromuscular disease   ? GI/Hepatic ?Neg liver ROS, GERD  Medicated,  ?Endo/Other  ?negative endocrine ROSdiabetes, Type 2 ? Renal/GU ?negative Renal ROS  ?negative genitourinary ?  ?Musculoskeletal ? ? Abdominal ?  ?Peds ? Hematology ? ?(+) Blood dyscrasia, anemia ,   ?Anesthesia Other Findings ?a. 09/2020 Echo: EF 45%, mild LVH; b. 12/2020 Echo: EF 25-30%, glob HK, mod LVH, mildly reduced RV fxn, RVSP 65.13mHg, mod BAE. Triv effusion. Mod MR/TR. ? Reproductive/Obstetrics ?negative OB ROS ? ?  ? ? ? ? ? ? ? ? ? ? ? ? ? ?  ?  ? ? ? ? ? ? ? ? ?Anesthesia Physical ? ?Anesthesia Plan ? ?ASA: 3 ? ?Anesthesia Plan: MAC  ? ?Post-op Pain Management:   ? ?Induction:  ? ?PONV Risk Score and Plan:  ? ?Airway Management Planned:  ? ?Additional Equipment:  ? ?Intra-op Plan:  ? ?Post-operative Plan:  ? ?Informed Consent: I have reviewed the patients History and Physical, chart, labs and discussed the procedure including the risks, benefits and alternatives for the proposed anesthesia with the patient or authorized representative who has indicated his/her understanding and acceptance.  ? ? ? ?Dental Advisory Given ? ?Plan Discussed with: CRNA ? ?Anesthesia Plan Comments:   ? ? ? ? ? ? ?Anesthesia Quick Evaluation ? ?

## 2021-05-29 NOTE — Interval H&P Note (Signed)
History and Physical Interval Note: ? ?05/29/2021 ?12:48 PM ? ?Allen Yu  has presented today for surgery, with the diagnosis of combined forms age related cataract; left.  The various methods of treatment have been discussed with the patient and family. After consideration of risks, benefits and other options for treatment, the patient has consented to  Procedure(s) with comments: ?CATARACT EXTRACTION PHACO AND INTRAOCULAR LENS PLACEMENT (IOC) (Left) - left as a surgical intervention.  The patient's history has been reviewed, patient examined, no change in status, stable for surgery.  I have reviewed the patient's chart and labs.  Questions were answered to the patient's satisfaction.   ? ? ?Baruch Goldmann ? ? ?

## 2021-05-29 NOTE — Transfer of Care (Signed)
Immediate Anesthesia Transfer of Care Note ? ?Patient: Allen Yu ? ?Procedure(s) Performed: CATARACT EXTRACTION PHACO AND INTRAOCULAR LENS PLACEMENT (IOC) (Left: Eye) ? ?Patient Location: PACU ? ?Anesthesia Type:MAC ? ?Level of Consciousness: awake and alert  ? ?Airway & Oxygen Therapy: Patient Spontanous Breathing and Patient connected to nasal cannula oxygen ? ?Post-op Assessment: Report given to RN and Post -op Vital signs reviewed and stable ? ?Post vital signs: Reviewed and stable ? ?Last Vitals:  ?Vitals Value Taken Time  ?BP 157/64   ?Temp    ?Pulse 78   ?Resp 16   ?SpO2 94%   ? ? ?Last Pain: There were no vitals filed for this visit.   ? ?  ? ?Complications: No notable events documented. ?

## 2021-05-29 NOTE — Op Note (Signed)
Date of procedure: 05/29/21 ? ?Pre-operative diagnosis: Visually significant age-related cataract, Left Eye; Poor dilation, Left eye (H25.812)  ? ?Post-operative diagnosis: Visually significant age-related cataract, Left Eye; Intra-operative Floppy Iris Syndrome, Left Eye (H21.81) ? ?Procedure: Complex removal of cataract via phacoemulsification and insertion of intra-ocular lens Rayner [id number] +19.5D into the capsular bag of the Left Eye (CPT [phone]) ? ?Attending surgeon: Gerda Diss. Marisa Hua, MD, MA ? ?Anesthesia: MAC, Topical [place] ? ?Complications: None ? ?Estimated Blood Loss: <32m (minimal) ? ?Specimens: None ? ?Implants: As above ? ?Indications:  Visually significant cataract, Left Eye ? ?Procedure:  ?The patient was seen and identified in the pre-operative area. The operative eye was identified and dilated.  The operative eye was marked.  Topical anesthesia was administered to the operative eye.    ? ?The patient was then to the operative suite and placed in the supine position.  A timeout was performed confirming the patient, procedure to be performed, and all other relevant information.   The patient's face was prepped and draped in the usual fashion for intra-ocular surgery.  A lid speculum was placed into the operative eye and the surgical microscope moved into place and focused.  Poor dilation of the iris was confirmed.  An inferotemporal paracentesis was created using a 20 gauge paracentesis blade.  Shugarcaine was injected into the anterior chamber.  Viscoelastic was injected into the anterior chamber.  A temporal clear-corneal main wound incision was created using a 2.465mmicrokeratome.  A Malyugin ring was placed.  A continuous curvilinear capsulorrhexis was initiated using an irrigating cystitome and completed using capsulorrhexis forceps.  Hydrodissection and hydrodeliniation were performed.  Viscoelastic was injected into the anterior chamber.  A phacoemulsification handpiece and a chopper as a second  instrument were used to remove the nucleus and epinucleus. The irrigation/aspiration handpiece was used to remove any remaining cortical material.  ? ?The capsular bag was reinflated with viscoelastic, checked, and found to be intact.  The intraocular lens was inserted into the capsular bag and dialed into place using a MaSurveyor, mineralsThe Malyugin ring was removed.  The irrigation/aspiration handpiece was used to remove any remaining viscoelastic.  The clear corneal wound and paracentesis wounds were then hydrated and checked with Weck-Cels to be watertight.  The lid-speculum and drape was removed, and the patient's face was cleaned with a wet and dry 4x4.  Maxitrol was instilled in the eye. A clear shield was taped over the eye. The patient was taken to the post-operative care unit in good condition, having tolerated the procedure well. ? ?Post-Op Instructions: [facility] and will receive all other orders and instructions. ? ?

## 2021-05-29 NOTE — Anesthesia Postprocedure Evaluation (Signed)
Anesthesia Post Note ? ?Patient: Allen Yu ? ?Procedure(s) Performed: CATARACT EXTRACTION PHACO AND INTRAOCULAR LENS PLACEMENT (IOC) (Left: Eye) ? ?Patient location during evaluation: Phase II ?Anesthesia Type: MAC ?Level of consciousness: awake ?Pain management: pain level controlled ?Vital Signs Assessment: post-procedure vital signs reviewed and stable ?Respiratory status: spontaneous breathing and respiratory function stable ?Cardiovascular status: blood pressure returned to baseline and stable ?Postop Assessment: no headache and no apparent nausea or vomiting ?Anesthetic complications: no ?Comments: Late entry ? ? ?No notable events documented. ? ? ?Last Vitals:  ?Vitals:  ? 05/29/21 1325  ?BP: 136/74  ?Pulse: 78  ?Resp: 15  ?Temp: 36.7 ?C  ?SpO2: 100%  ?  ?Last Pain:  ?Vitals:  ? 05/29/21 1325  ?TempSrc: Oral  ?PainSc: 0-No pain  ? ? ?  ?  ?  ?  ?  ?  ? ?Louann Sjogren ? ? ? ? ?

## 2021-05-29 NOTE — Discharge Instructions (Addendum)
Please discharge patient when stable, will follow up today with Dr. Wrzosek at the  [facility] office immediately following discharge.  Leave shield in place until visit.  All paperwork with discharge instructions will be given at the office.  [facility] Address:  [address]  [place], [place] [address]  

## 2021-05-30 ENCOUNTER — Encounter (HOSPITAL_COMMUNITY): Payer: TRICARE For Life (TFL)

## 2021-05-31 ENCOUNTER — Encounter (HOSPITAL_COMMUNITY): Payer: Self-pay | Admitting: Ophthalmology

## 2021-06-01 ENCOUNTER — Telehealth: Payer: Self-pay | Admitting: *Deleted

## 2021-06-01 ENCOUNTER — Encounter (HOSPITAL_COMMUNITY): Payer: TRICARE For Life (TFL) | Admitting: Physical Therapy

## 2021-06-01 DIAGNOSIS — Z79899 Other long term (current) drug therapy: Secondary | ICD-10-CM

## 2021-06-01 MED ORDER — LOSARTAN POTASSIUM 50 MG PO TABS: 50.0000 mg | ORAL_TABLET | Freq: Every day | ORAL | 11 refills | Status: DC

## 2021-06-01 NOTE — Telephone Encounter (Signed)
Wife notified of test results and the need for lab work.  ?

## 2021-06-01 NOTE — Telephone Encounter (Signed)
-----   Message from Erma Heritage, [place] sent at 05/30/2021 12:44 PM EDT ----- ?Please let the patient's wife know I reviewed his recent labs from his PCP and his renal function had actually worsened from hospital discharge as his creatinine was at 1.75 at the time of hospital discharge and had increased to 2.6 when checked on 4/17. I would recommend they reduce Torsemide from '30mg'$  daily to '20mg'$  daily (verify dose as listed as '10mg'$  daily in prior notes). Recheck BMET in 2 weeks. If renal function has not improved, will likely need to stop Losartan and transition to a different BP medication that is not processed through the kidneys. Please make sure he is not taking Advil or Motrin. Tylenol is the safest option.  ?

## 2021-06-05 ENCOUNTER — Encounter (HOSPITAL_COMMUNITY): Payer: TRICARE For Life (TFL) | Admitting: Physical Therapy

## 2021-06-07 ENCOUNTER — Telehealth: Payer: Self-pay | Admitting: Student

## 2021-06-07 ENCOUNTER — Encounter (HOSPITAL_COMMUNITY): Payer: TRICARE For Life (TFL) | Admitting: Physical Therapy

## 2021-06-07 NOTE — Telephone Encounter (Signed)
Pt c/o BP issue: STAT if pt c/o blurred vision, one-sided weakness or slurred speech ? ?1. What are your last 5 BP readings?  ?127/85 ?140/98 ?119/59 ?131/67 ?107/62 ?104/64 ? ?2. Are you having any other symptoms (ex. Dizziness, headache, blurred vision, passed out)? He has been sleeping a lot, doesn't want to eat anything, but patient does have dementia.  ? ?3. What is your BP issue? Wife states they recently change his dose to just 1 tablet of torsemide (DEMADEX) 20 MG tablet, instead of 1 1/2 tablets.  ?

## 2021-06-07 NOTE — Telephone Encounter (Signed)
? ? ?  Appears BP has overall been well-controlled. I do not think the dose reduction of Torsemide would account for his sleepiness and decreased appetite. If anything, we sometimes have to reduce the dose further if his PO intake remains decreased to avoid dehydration. ? ?Signed, ?Erma Heritage, PA-C ?06/07/2021, 12:40 PM ?Pager: [phone] ? ?

## 2021-06-09 ENCOUNTER — Telehealth: Payer: Self-pay | Admitting: *Deleted

## 2021-06-09 MED ORDER — LOSARTAN POTASSIUM 25 MG PO TABS: 25.0000 mg | ORAL_TABLET | Freq: Every day | ORAL | 3 refills | Status: DC

## 2021-06-09 NOTE — Telephone Encounter (Signed)
Pt's wife walked in and states that pt's BP is dropping after the decrease in Torsemide. Wife informed that decrease in torsemide should not effect BP. After discussion with [place], PA-C, Losartan decreased to 25 mg Daily.

## 2021-06-12 ENCOUNTER — Other Ambulatory Visit: Payer: Self-pay | Admitting: *Deleted

## 2021-06-12 ENCOUNTER — Encounter (HOSPITAL_COMMUNITY): Payer: TRICARE For Life (TFL)

## 2021-06-12 NOTE — Patient Outreach (Signed)
[name] Care Management  06/12/2021  Allen Yu July 16, 1945 [id number]   [facility] Unsuccessful outreach [phone] Outreach attempt to the listed at the preferred outreach number in EPIC  No answer. [facility] RN CM left HIPAA [facility] Portability and Accountability Act) compliant voicemail message along with CM's contact info.           Plan: [facility] RN CM scheduled this patient for another call attempt within 4-7 business days Unsuccessful outreach letter sent on  Unsuccessful outreach on    [name]. [name], RN, BSN, [place] Coordinator Office number [phone]

## 2021-06-14 ENCOUNTER — Other Ambulatory Visit (HOSPITAL_COMMUNITY)
Admission: RE | Admit: 2021-06-14 | Discharge: 2021-06-14 | Disposition: A | Discharge status: Home / Self Care | Payer: Medicare Other | Source: Ambulatory Visit | Attending: Student | Admitting: Student

## 2021-06-14 ENCOUNTER — Telehealth: Payer: Self-pay | Admitting: Student

## 2021-06-14 ENCOUNTER — Encounter (HOSPITAL_COMMUNITY): Payer: TRICARE For Life (TFL)

## 2021-06-14 DIAGNOSIS — Z79899 Other long term (current) drug therapy: Secondary | ICD-10-CM | POA: Diagnosis not present

## 2021-06-14 LAB — BASIC METABOLIC PANEL
Anion gap: 7 (ref 5–15)
BUN: 49 mg/dL — ABNORMAL HIGH (ref 8–23)
CO2: 26 mmol/L (ref 22–32)
Calcium: 8.8 mg/dL — ABNORMAL LOW (ref 8.9–10.3)
Chloride: 104 mmol/L (ref 98–111)
Creatinine, Ser: 1.79 mg/dL — ABNORMAL HIGH (ref 0.61–1.24)
GFR, Estimated: 39 mL/min — ABNORMAL LOW (ref >60)
Glucose, Bld: 243 mg/dL — ABNORMAL HIGH (ref 70–99)
Potassium: 3.5 mmol/L (ref 3.5–5.1)
Sodium: 137 mmol/L (ref 135–145)

## 2021-06-14 NOTE — Telephone Encounter (Signed)
Await provide to result labs first (just drawn 30 minutes ago)

## 2021-06-14 NOTE — Telephone Encounter (Signed)
Please send results from lab results to the [name] Attention Charlean Merl [phone]

## 2021-06-15 NOTE — Telephone Encounter (Signed)
Pts wife stated that pt's bp has been doing better since reduction in Losartan.  BP:  137/85 128/84 165/101- Pt was smoking cig/ate fried foods 118/96  Copied to PCP/[facility]

## 2021-06-15 NOTE — Telephone Encounter (Signed)
Erma Heritage, PA-C  06/14/2021  6:26 PM EDT Back to Top    Please let the patient and his wife know that his electrolytes are within a normal range. His kidney function has improved when compared to 04/2021 as his creatinine was at  2.6 when checked by his PCP on 05/08/21 and is now down to 1.79. Please follow-up to see his his BP has been doing since dose reduction of Losartan last week.

## 2021-06-15 NOTE — Telephone Encounter (Signed)
Patient's wife returned call. Transferred to [place].

## 2021-06-16 ENCOUNTER — Other Ambulatory Visit: Payer: Self-pay | Admitting: *Deleted

## 2021-06-16 DIAGNOSIS — I5021 Acute systolic (congestive) heart failure: Secondary | ICD-10-CM | POA: Diagnosis not present

## 2021-06-16 DIAGNOSIS — I739 Peripheral vascular disease, unspecified: Secondary | ICD-10-CM | POA: Diagnosis not present

## 2021-06-16 DIAGNOSIS — N1832 Chronic kidney disease, stage 3b: Secondary | ICD-10-CM | POA: Diagnosis not present

## 2021-06-16 NOTE — Patient Outreach (Signed)
[facility]) Care Management Telephonic RN Care Manager Note   06/27/2021 Name:  Allen Yu MRN:  [id number] DOB:  06-Dec-1945  Summary: Follow up outreach Spoke with wife,  Wife has been approved to care for patient by [facility] vs having another aide come into the home. Plus she has respite services available 4 hour prn  Saw pcp today  Confirmed completed both cataract surgery To go to get new glasses in July 2023   Confirm in outpatient PT not going as well as he would hope  Has a beach trip plan  Everyday go out of home   Appetite good and not always following his diabetic diet well (honey buns)  Wife discussed her preference of monthly outreaches  Recommendations/Changes made from today's visit: Completed follow up outreach- assessed for needs, changes Offered any assistance, denied need Reviewed [facility] progression and discussed frequency of outreaches    Subjective: Allen Yu is an 76 y.o. year old male who is a primary patient of [name], MD. The care management team was consulted for assistance with care management and/or care coordination needs.    Telephonic RN Care Manager completed Telephone Visit today.   Objective:  Medications Reviewed Today     Reviewed by Jalene Mullet, RN (Registered Nurse) on 05/29/21 at 1234  Med List Status: <None>   Medication Order Taking? Sig Documenting Provider Last Dose Status Informant  acetaminophen (TYLENOL) 500 MG tablet [id number] Yes Take 500 mg by mouth 2 (two) times daily as needed for moderate pain. [provider] 05/28/2021 Active Spouse/Significant Other, Pharmacy Records  Alcohol Swabs (ALCOHOL PADS) 70 % PADS [id number] Yes USE 1 PAD AS DIRECTED [provider] 05/28/2021 Active Spouse/Significant Other, Pharmacy Records  aspirin EC 81 MG tablet [id number] Yes Take 1 tablet (81 mg total) by mouth daily with breakfast. Roxan Hockey, MD 05/28/2021 Active Spouse/Significant Other,  Pharmacy Records  atorvastatin (LIPITOR) 40 MG tablet [id number] Yes Take 40 mg by mouth daily. [provider] 05/28/2021 Active Spouse/Significant Other, Pharmacy Records  B-D UF III MINI PEN NEEDLES 31G X 5 MM MISC [id number] Yes SMARTSIG:1 Each SUB-Q Daily [provider] 05/28/2021 Active Spouse/Significant Other, Pharmacy Records  clopidogrel (PLAVIX) 75 MG tablet [id number] Yes Take 1 tablet (75 mg total) by mouth daily. Roxan Hockey, MD 05/28/2021 Active Spouse/Significant Other, Pharmacy Records  Dextromethorphan-GG-APAP (CORICIDIN HBP COLD/COUGH/FLU) 10-200-325 MG/15ML LIQD [id number] Yes Take 30 mLs by mouth as needed (cough). [provider] 05/28/2021 Active Spouse/Significant Other, Pharmacy Records  donepezil (ARICEPT) 10 MG tablet [id number] Yes Take 10 mg by mouth daily. [provider] 05/28/2021 Active Spouse/Significant Other, Pharmacy Records  doxycycline (VIBRAMYCIN) 100 MG capsule [id number] Yes Take 100 mg by mouth 2 (two) times daily. [provider] 05/28/2021 Active   feeding supplement, ENSURE COMPLETE, (ENSURE COMPLETE) LIQD [id number] Yes Take 237 mLs by mouth 2 (two) times daily between meals. Roxan Hockey, MD 05/28/2021 Active Spouse/Significant Other, Pharmacy Records  ferrous sulfate 325 (65 FE) MG tablet [id number] Yes Take 325 mg by mouth daily with breakfast. [provider] 05/28/2021 Active Spouse/Significant Other, Pharmacy Records  gabapentin (NEURONTIN) 100 MG capsule [id number] Yes Take 100 mg by mouth daily. [provider] 05/28/2021 Active Spouse/Significant Other, Pharmacy Records  LANTUS 100 UNIT/ML injection [id number] Yes Inject 0.06 mLs (6 Units total) into the skin at bedtime as needed (High blood glucose). If Blood glucose over 200  Patient taking differently: Inject 15 Units into the skin daily.   Emokpae, Courage,  MD 05/28/2021 Active Spouse/Significant Other, Pharmacy Records  losartan (COZAAR) 50 MG tablet  [id number] Yes Take 1 tablet (50 mg total) by mouth daily.  Patient taking differently: Take 100 mg by mouth daily.   Erma Heritage, PA-C 05/28/2021 Active Spouse/Significant Other, Pharmacy Records  metoprolol succinate (TOPROL-XL) 50 MG 24 hr tablet [id number]  Take 1 tablet (50 mg total) by mouth daily. Take with or immediately following a meal. Strader, Fransisco Hertz, PA-C  Active Spouse/Significant Other, Pharmacy Records  Multiple Vitamins-Minerals (MULTIVITAMIN WITH MINERALS) tablet [id number] Yes Take 1 tablet by mouth daily. Roxan Hockey, MD 05/28/2021 Active Spouse/Significant Other, Pharmacy Records  omeprazole (PRILOSEC) 20 MG capsule [id number] Yes Take 1 capsule by mouth daily. [provider] 05/28/2021 Active Spouse/Significant Other, Pharmacy Records  ondansetron (ZOFRAN) 4 MG tablet [id number] Yes Take 1 tablet (4 mg total) by mouth every 6 (six) hours as needed for nausea. Roxan Hockey, MD 05/28/2021 Active Spouse/Significant Other, Pharmacy Records  risperiDONE (RISPERDAL) 0.5 MG tablet [id number] Yes Take 1 tablet (0.5 mg total) by mouth every 12 (twelve) hours as needed (Restlessness and agitation). Roxan Hockey, MD 05/28/2021 Active Spouse/Significant Other, Pharmacy Records  torsemide (DEMADEX) 20 MG tablet [id number] Yes Take 1.5 tablets (30 mg total) by mouth daily.  Patient taking differently: Take 10 mg by mouth daily. Take 10 mg daily   Barton Dubois, MD 05/28/2021 Active Spouse/Significant Other, Pharmacy Records  traMADol (ULTRAM) 50 MG tablet [id number] Yes Take 50 mg by mouth every 6 (six) hours as needed for moderate pain. [provider] 05/28/2021 Active Spouse/Significant Other, Pharmacy Records  Vitamin D, Ergocalciferol, (DRISDOL) 1.25 MG (50000 UNIT) CAPS capsule [id number] Yes Take 50,000 Units by mouth every Friday. [provider] 05/28/2021 Active Spouse/Significant Other, Pharmacy Records  Med List Note Jolayne Haines, CPhT 07/09/16  [id number]): Dr. [name] [phone]             SDOH:  (Social Determinants of Health) assessments and interventions performed:    Care Plan  Review of patient past medical history, allergies, medications, health status, including review of consultants reports, laboratory and other test data, was performed as part of comprehensive evaluation for care management services.   Care Plan : RN Care Manager Plan of Care  Updates made by Barbaraann Faster, RN since 06/27/2021 12:00 AM     Problem: Complex Care Coordination Needs and disease management in patient with DM II, CKD 3, Afib, PTSD   Priority: High     Long-Range Goal: Establish Plan of Care for Management Complex SDOH Barriers, disease management and Care Coordination Needs in patient with DM II, CKD 3, Afib, PTSD   Start Date: 12/21/2020  This Visit's Progress: On track  Recent Progress: On track  Priority: High  Note:   Current Barriers:  Knowledge Deficits related to plan of care for management of Atrial Fibrillation, DMII, CKD Stage 3, and PTSD/dementia  Care Coordination needs related to Lacks knowledge of community resource: VA benefits Health behaviors, bilateral hearing (12/120/22 now has hearing aides), [place] Veteran with PTSD/psychosis and reported psychosocial population concerns, memory concerns   RN CM Clinical Goal(s):  Patient will work with [employer] administration ([facility]) benefit providers to continue home management of DM II, Afib, CKD, PTSD/dementia as evidenced by decrease in admissions and improved lab values, behavior  through collaboration with Consulting civil engineer, provider, and care team.   Interventions: Follow up outreaches to wife and patient as allowed to further assess and assist with resources/guidance, care coordination and/or disease  management/education  Inter-disciplinary care team collaboration (see longitudinal plan of care) Evaluation of current treatment plan related to  self management and patient's  adherence to plan as established by provider 01/10/21 care coordination completed for home assistance with catheter change monthly no available via home health per medicare guideline changes (not skilled need) -RN CM outreach to [facility] staff, updated wife 01/10/21 Confirmed with wife further rehabilitation outpatient near [facility] ordered by pcp.   03/13/21 wife reports improvements with outpatient therapy, pending prosthetic, better appetite, better sleeping continues with [facility] home care services, psychology services, urology services- pt continues to smoke but not interested in smoke cessation   AFIB Interventions: (Status:  Goal on track:  Yes.) Long Term Goal   Reviewed importance of adherence to anticoagulant exactly as prescribed Counseled on importance of regular laboratory monitoring as prescribed Afib action plan reviewed Screening for signs and symptoms of depression related to chronic disease state  Assessed social determinant of health barriers    Chronic Kidney Disease Interventions:  (Status:  Goal on track:  Yes.) Long Term Goal 06/16/21 improvements noted 04/21/21 recent admission for sepsis continues with foley  Evaluation of current treatment plan related to chronic kidney disease self management and patient's adherence to plan as established by provider      Engage patient in early, proactive and ongoing discussion about goals of care and what matters most to them    Suggested leg bag with Velcro to prevent penial irritation during the day  Last practice recorded BP readings:  BP Readings from Last 3 Encounters:  05/29/21 136/74  05/26/21 128/68  05/23/21 124/73  Most recent eGFR/CrCl: No results found for: EGFR  No components found for: CRCL    Diabetes Interventions:  (Status:  Goal on track:  Yes.) Long Term Goal 06/16/21 Appetite good and not always following his diabetic diet well (honey buns) Assessed patient's understanding of A1c goal:  <7% Discussed plans with patient for ongoing care management follow up and provided patient with direct contact information for care management team Review of patient status, including review of consultants reports, relevant laboratory and other test results, and medications completed Lab Results  Component Value Date   HGBA1C 8.8 (H) 01/14/2021   Dementia:  (Status:  Goal on track:  Yes.)  Long Term Goal 06/16/21 Everyday the wife tries to take pt out of the home for socialization or outpatient PT and has a planned beach trip for the summer  Evaluation of current treatment plan related to misuse of: Dementia with other behavioral disturbances Collaborated with [facility] personal care services, psychology services, pcp, outpatient rehab services, Emotional Support Provided to patient/caregiver, Discussed importance of attendance to all provider appointments, and Advised to contact provider for new or worsening symptoms   Interdisciplinary Collaboration Interventions:  (Status: Goal Met.) Short Term Goal   Collaborated with BSW to initiate plan of care to address needs related to Limited access to caregiver and Lacks knowledge of community resource: [facility], home foley care  in patient with CHF, DMII, CKD Stage 3, and recurrent UTIs Collaboration with [name], MD regarding development and update of comprehensive plan of care as evidenced by provider attestation and co-signature Inter-disciplinary care team collaboration  04/21/21 Received wife's voice message Reviewed EPIC care everywhere, found number for [place] [phone] Outreach to F. Elvina Sidle to update her on wife voice message and aide. Wife to be called  RN  CM discussed the risks of foley catheters to include recurrent UTIs  RN CM discussed Supra pubic catheters, condom catheters (but only if pt is not having urinary retention)  Wife was encouraged to speak with the urology staff  about these options RN CM offered suggestions to keep the foley catheter bag secure to Mr Boot's thigh to prevent pulling, etc Garter belts or thigh hose to secure bag to his thigh, a bigger foley bag to decrease having to empty frequently   06/16/21  Wife has been approved to care for patient by [facility] vs having another aide come into the home. Plus she has respite services available 4 hour prn  Patient Goals/Self-Care Activities: Take all medications as prescribed Attend all scheduled provider appointments Call provider office for new concerns or questions   Follow Up Plan:  The patient has been provided with contact information for the care management team and has been advised to call with any health related questions or concerns.  The care management team will reach out to the patient again over the next 30+ business days.       Plan: The patient has been provided with contact information for the care management team and has been advised to call with any health related questions or concerns.  The care management team will reach out to the patient again over the next 30+ business days.  Yanitza Shvartsman L. [name], RN, BSN, [place] Coordinator Office number [phone] [facility] number [phone] Fax number [phone]

## 2021-06-20 ENCOUNTER — Telehealth: Payer: Self-pay | Admitting: Cardiology

## 2021-06-20 NOTE — Telephone Encounter (Signed)
Pt wife notified and verbalized understanding. Pt's wife will call office back if she notices no changes in pt's swelling.

## 2021-06-20 NOTE — Telephone Encounter (Signed)
Satira Sark, MD     I reviewed the chart and recent office note by Ms. Strader PA-C.  Demadex was cut from 30 mg daily to 20 mg daily due to acute on chronic renal insufficiency.  Last creatinine had improved.  Might consider using Demadex 20 mg 1-1/2 tablets for a few days at a time to treat swelling on an as-needed basis rather than returning to a standing 30 mg daily dose.

## 2021-06-20 NOTE — Telephone Encounter (Signed)
Spoke with wife who states that she noticed that pt has swelling to his face and foot since Friday. She does report that pt is coughing more that normal. He does not weight daily due to wife being unable to weigh him. He denies being SOB. BP's are 136/99 and 152/52. She states that this was taken with a wrist cuff. Please advise.

## 2021-06-20 NOTE — Telephone Encounter (Signed)
Pt c/o medication issue:  1. Name of Medication: losartan (COZAAR) 25 MG tablet torsemide (DEMADEX) 20 MG tablet  2. How are you currently taking this medication (dosage and times per day)? As prescribed   3. Are you having a reaction (difficulty breathing--STAT)? Yes  4. What is your medication issue? Pt is experiencing swelling in face and slight swelling in legs. Pt's wife states that med was taken down in dosage and believes this may be the result in change of dosage. Requesting call back.

## 2021-06-21 ENCOUNTER — Telehealth (HOSPITAL_COMMUNITY): Payer: Self-pay | Admitting: Occupational Therapy

## 2021-06-21 NOTE — Telephone Encounter (Signed)
L/m and requested AUthorization information for this patient that was promised to be here after the [facility] 06/19/21 [facility] Day. Waiting on response from Ms. Donnely.

## 2021-06-26 ENCOUNTER — Other Ambulatory Visit: Payer: Self-pay

## 2021-06-26 MED ORDER — METOPROLOL SUCCINATE ER 50 MG PO TB24: 50.0000 mg | ORAL_TABLET | Freq: Every day | ORAL | 3 refills | Status: DC

## 2021-07-04 ENCOUNTER — Inpatient Hospital Stay (HOSPITAL_COMMUNITY)
Admission: EM | Admit: 2021-07-04 | Discharge: 2021-07-07 | DRG: 291 | Disposition: A | Discharge status: Home Health | Payer: Medicare Other | Attending: Family Medicine | Admitting: Family Medicine

## 2021-07-04 ENCOUNTER — Inpatient Hospital Stay (HOSPITAL_COMMUNITY): Payer: Medicare Other

## 2021-07-04 ENCOUNTER — Other Ambulatory Visit: Payer: Self-pay

## 2021-07-04 ENCOUNTER — Emergency Department (HOSPITAL_COMMUNITY): Payer: Medicare Other

## 2021-07-04 ENCOUNTER — Encounter (HOSPITAL_COMMUNITY): Payer: Self-pay

## 2021-07-04 DIAGNOSIS — J9601 Acute respiratory failure with hypoxia: Secondary | ICD-10-CM | POA: Diagnosis present

## 2021-07-04 DIAGNOSIS — I130 Hypertensive heart and chronic kidney disease with heart failure and stage 1 through stage 4 chronic kidney disease, or unspecified chronic kidney disease: Secondary | ICD-10-CM | POA: Diagnosis present

## 2021-07-04 DIAGNOSIS — Z66 Do not resuscitate: Secondary | ICD-10-CM | POA: Diagnosis present

## 2021-07-04 DIAGNOSIS — D649 Anemia, unspecified: Secondary | ICD-10-CM | POA: Diagnosis present

## 2021-07-04 DIAGNOSIS — Z7982 Long term (current) use of aspirin: Secondary | ICD-10-CM

## 2021-07-04 DIAGNOSIS — Z89612 Acquired absence of left leg above knee: Secondary | ICD-10-CM

## 2021-07-04 DIAGNOSIS — Z794 Long term (current) use of insulin: Secondary | ICD-10-CM

## 2021-07-04 DIAGNOSIS — J811 Chronic pulmonary edema: Secondary | ICD-10-CM | POA: Diagnosis not present

## 2021-07-04 DIAGNOSIS — E1151 Type 2 diabetes mellitus with diabetic peripheral angiopathy without gangrene: Secondary | ICD-10-CM | POA: Diagnosis present

## 2021-07-04 DIAGNOSIS — J189 Pneumonia, unspecified organism: Secondary | ICD-10-CM | POA: Diagnosis present

## 2021-07-04 DIAGNOSIS — I5023 Acute on chronic systolic (congestive) heart failure: Secondary | ICD-10-CM

## 2021-07-04 DIAGNOSIS — E1165 Type 2 diabetes mellitus with hyperglycemia: Secondary | ICD-10-CM | POA: Diagnosis present

## 2021-07-04 DIAGNOSIS — N179 Acute kidney failure, unspecified: Secondary | ICD-10-CM | POA: Diagnosis not present

## 2021-07-04 DIAGNOSIS — F4310 Post-traumatic stress disorder, unspecified: Secondary | ICD-10-CM | POA: Diagnosis present

## 2021-07-04 DIAGNOSIS — R339 Retention of urine, unspecified: Secondary | ICD-10-CM | POA: Diagnosis not present

## 2021-07-04 DIAGNOSIS — F209 Schizophrenia, unspecified: Secondary | ICD-10-CM

## 2021-07-04 DIAGNOSIS — E1122 Type 2 diabetes mellitus with diabetic chronic kidney disease: Secondary | ICD-10-CM | POA: Diagnosis present

## 2021-07-04 DIAGNOSIS — R0603 Acute respiratory distress: Secondary | ICD-10-CM | POA: Diagnosis not present

## 2021-07-04 DIAGNOSIS — I4891 Unspecified atrial fibrillation: Secondary | ICD-10-CM | POA: Diagnosis not present

## 2021-07-04 DIAGNOSIS — N189 Chronic kidney disease, unspecified: Secondary | ICD-10-CM | POA: Diagnosis not present

## 2021-07-04 DIAGNOSIS — Z91030 Bee allergy status: Secondary | ICD-10-CM

## 2021-07-04 DIAGNOSIS — I420 Dilated cardiomyopathy: Secondary | ICD-10-CM | POA: Diagnosis present

## 2021-07-04 DIAGNOSIS — Z7902 Long term (current) use of antithrombotics/antiplatelets: Secondary | ICD-10-CM

## 2021-07-04 DIAGNOSIS — Z91148 Patient's other noncompliance with medication regimen for other reason: Secondary | ICD-10-CM

## 2021-07-04 DIAGNOSIS — D631 Anemia in chronic kidney disease: Secondary | ICD-10-CM | POA: Diagnosis not present

## 2021-07-04 DIAGNOSIS — I160 Hypertensive urgency: Secondary | ICD-10-CM | POA: Diagnosis present

## 2021-07-04 DIAGNOSIS — N1832 Chronic kidney disease, stage 3b: Secondary | ICD-10-CM | POA: Diagnosis not present

## 2021-07-04 DIAGNOSIS — Z8249 Family history of ischemic heart disease and other diseases of the circulatory system: Secondary | ICD-10-CM

## 2021-07-04 DIAGNOSIS — F17210 Nicotine dependence, cigarettes, uncomplicated: Secondary | ICD-10-CM | POA: Diagnosis present

## 2021-07-04 DIAGNOSIS — I10 Essential (primary) hypertension: Secondary | ICD-10-CM | POA: Diagnosis not present

## 2021-07-04 DIAGNOSIS — E785 Hyperlipidemia, unspecified: Secondary | ICD-10-CM | POA: Diagnosis not present

## 2021-07-04 DIAGNOSIS — R0602 Shortness of breath: Secondary | ICD-10-CM

## 2021-07-04 DIAGNOSIS — Z91119 Patient's noncompliance with dietary regimen due to unspecified reason: Secondary | ICD-10-CM

## 2021-07-04 DIAGNOSIS — F0390 Unspecified dementia without behavioral disturbance: Secondary | ICD-10-CM | POA: Diagnosis not present

## 2021-07-04 DIAGNOSIS — E1159 Type 2 diabetes mellitus with other circulatory complications: Secondary | ICD-10-CM | POA: Diagnosis not present

## 2021-07-04 DIAGNOSIS — Z888 Allergy status to other drugs, medicaments and biological substances status: Secondary | ICD-10-CM

## 2021-07-04 DIAGNOSIS — K219 Gastro-esophageal reflux disease without esophagitis: Secondary | ICD-10-CM

## 2021-07-04 DIAGNOSIS — E1142 Type 2 diabetes mellitus with diabetic polyneuropathy: Secondary | ICD-10-CM | POA: Diagnosis present

## 2021-07-04 DIAGNOSIS — Z79899 Other long term (current) drug therapy: Secondary | ICD-10-CM

## 2021-07-04 DIAGNOSIS — I480 Paroxysmal atrial fibrillation: Secondary | ICD-10-CM | POA: Diagnosis not present

## 2021-07-04 DIAGNOSIS — J80 Acute respiratory distress syndrome: Secondary | ICD-10-CM | POA: Diagnosis not present

## 2021-07-04 DIAGNOSIS — R000 Tachycardia, unspecified: Secondary | ICD-10-CM | POA: Diagnosis not present

## 2021-07-04 LAB — COMPREHENSIVE METABOLIC PANEL
ALT: 18 U/L (ref 0–44)
AST: 23 U/L (ref 15–41)
Albumin: 3.8 g/dL (ref 3.5–5.0)
Alkaline Phosphatase: 126 U/L (ref 38–126)
Anion gap: 4 — ABNORMAL LOW (ref 5–15)
BUN: 31 mg/dL — ABNORMAL HIGH (ref 8–23)
CO2: 30 mmol/L (ref 22–32)
Calcium: 8.7 mg/dL — ABNORMAL LOW (ref 8.9–10.3)
Chloride: 103 mmol/L (ref 98–111)
Creatinine, Ser: 1.85 mg/dL — ABNORMAL HIGH (ref 0.61–1.24)
GFR, Estimated: 38 mL/min — ABNORMAL LOW (ref >60)
Glucose, Bld: 262 mg/dL — ABNORMAL HIGH (ref 70–99)
Potassium: 5.1 mmol/L (ref 3.5–5.1)
Sodium: 137 mmol/L (ref 135–145)
Total Bilirubin: 0.9 mg/dL (ref 0.3–1.2)
Total Protein: 8.3 g/dL — ABNORMAL HIGH (ref 6.5–8.1)

## 2021-07-04 LAB — CBC WITH DIFFERENTIAL/PLATELET
Abs Immature Granulocytes: 0.04 10*3/uL (ref 0.00–0.07)
Basophils Absolute: 0.1 10*3/uL (ref 0.0–0.1)
Basophils Relative: 1 %
Eosinophils Absolute: 0.2 10*3/uL (ref 0.0–0.5)
Eosinophils Relative: 2 %
HCT: 30 % — ABNORMAL LOW (ref 39.0–52.0)
Hemoglobin: 9.4 g/dL — ABNORMAL LOW (ref 13.0–17.0)
Immature Granulocytes: 0 %
Lymphocytes Relative: 30 %
Lymphs Abs: 2.9 10*3/uL (ref 0.7–4.0)
MCH: 31.6 pg (ref 26.0–34.0)
MCHC: 31.3 g/dL (ref 30.0–36.0)
MCV: 101 fL — ABNORMAL HIGH (ref 80.0–100.0)
Monocytes Absolute: 0.6 10*3/uL (ref 0.1–1.0)
Monocytes Relative: 7 %
Neutro Abs: 5.7 10*3/uL (ref 1.7–7.7)
Neutrophils Relative %: 60 %
Platelets: 452 10*3/uL — ABNORMAL HIGH (ref 150–400)
RBC: 2.97 MIL/uL — ABNORMAL LOW (ref 4.22–5.81)
RDW: 17.2 % — ABNORMAL HIGH (ref 11.5–15.5)
WBC: 9.6 10*3/uL (ref 4.0–10.5)
nRBC: 0 % (ref 0.0–0.2)

## 2021-07-04 LAB — ECHOCARDIOGRAM COMPLETE
AR max vel: 2.66 cm2
AV Area VTI: 2.74 cm2
AV Area mean vel: 2.31 cm2
AV Mean grad: 2 mmHg
AV Peak grad: 3.9 mmHg
Ao pk vel: 0.98 m/s
Area-P 1/2: 4.31 cm2
Calc EF: 37.9 %
MV VTI: 2.77 cm2
S' Lateral: 4.2 cm
Single Plane A2C EF: 39.1 %
Single Plane A4C EF: 39.4 %
Weight: 1888 oz

## 2021-07-04 LAB — TROPONIN I (HIGH SENSITIVITY)
Troponin I (High Sensitivity): 25 ng/L — ABNORMAL HIGH (ref <18)
Troponin I (High Sensitivity): 26 ng/L — ABNORMAL HIGH (ref <18)

## 2021-07-04 LAB — HEMOGLOBIN A1C
Hgb A1c MFr Bld: 6 % — ABNORMAL HIGH (ref 4.8–5.6)
Mean Plasma Glucose: 125.5 mg/dL

## 2021-07-04 LAB — PHOSPHORUS: Phosphorus: 3.8 mg/dL (ref 2.5–4.6)

## 2021-07-04 LAB — CBG MONITORING, ED: Glucose-Capillary: 244 mg/dL — ABNORMAL HIGH (ref 70–99)

## 2021-07-04 LAB — BRAIN NATRIURETIC PEPTIDE: B Natriuretic Peptide: 685 pg/mL — ABNORMAL HIGH (ref 0.0–100.0)

## 2021-07-04 LAB — TSH: TSH: 1.155 u[IU]/mL (ref 0.350–4.500)

## 2021-07-04 LAB — CREATININE, SERUM
Creatinine, Ser: 1.84 mg/dL — ABNORMAL HIGH (ref 0.61–1.24)
GFR, Estimated: 38 mL/min — ABNORMAL LOW (ref >60)

## 2021-07-04 LAB — GLUCOSE, CAPILLARY: Glucose-Capillary: 230 mg/dL — ABNORMAL HIGH (ref 70–99)

## 2021-07-04 LAB — MAGNESIUM: Magnesium: 2.2 mg/dL (ref 1.7–2.4)

## 2021-07-04 MED ORDER — ONDANSETRON HCL 4 MG/2ML IJ SOLN: 4.0000 mg | Freq: Four times a day (QID) | INTRAMUSCULAR | Status: DC | PRN

## 2021-07-04 MED ORDER — INSULIN DETEMIR 100 UNIT/ML ~~LOC~~ SOLN
12.0000 [IU] | Freq: Every day | SUBCUTANEOUS | Status: DC
  Administered 2021-07-04 – 2021-07-06 (×3): 12 [IU] via SUBCUTANEOUS
  Filled 2021-07-04 (×4): qty 0.12

## 2021-07-04 MED ORDER — ENOXAPARIN SODIUM 30 MG/0.3ML IJ SOSY
30.0000 mg | PREFILLED_SYRINGE | INTRAMUSCULAR | Status: DC
Start: 2021-07-04 — End: 2021-07-07
  Administered 2021-07-04 – 2021-07-06 (×3): 30 mg via SUBCUTANEOUS
  Filled 2021-07-04 (×3): qty 0.3

## 2021-07-04 MED ORDER — ADULT MULTIVITAMIN W/MINERALS CH: 1.0000 | ORAL_TABLET | Freq: Every day | ORAL | Status: DC

## 2021-07-04 MED ORDER — CLOPIDOGREL BISULFATE 75 MG PO TABS: 75.0000 mg | ORAL_TABLET | Freq: Every day | ORAL | Status: DC

## 2021-07-04 MED ORDER — METOPROLOL SUCCINATE ER 50 MG PO TB24: 50.0000 mg | ORAL_TABLET | Freq: Every day | ORAL | Status: DC

## 2021-07-04 MED ORDER — DONEPEZIL HCL 5 MG PO TABS
10.0000 mg | ORAL_TABLET | Freq: Every day | ORAL | Status: DC
  Administered 2021-07-04 – 2021-07-06 (×2): 10 mg via ORAL
  Filled 2021-07-04 (×3): qty 2

## 2021-07-04 MED ORDER — RISPERIDONE 0.5 MG PO TABS
0.5000 mg | ORAL_TABLET | Freq: Two times a day (BID) | ORAL | Status: DC | PRN
  Administered 2021-07-07: 0.5 mg via ORAL
  Filled 2021-07-04: qty 1

## 2021-07-04 MED ORDER — ENOXAPARIN SODIUM 30 MG/0.3ML IJ SOSY: 30.0000 mg | PREFILLED_SYRINGE | INTRAMUSCULAR | Status: DC

## 2021-07-04 MED ORDER — FERROUS SULFATE 325 (65 FE) MG PO TABS
325.0000 mg | ORAL_TABLET | Freq: Every day | ORAL | Status: DC
  Administered 2021-07-04 – 2021-07-07 (×4): 325 mg via ORAL
  Filled 2021-07-04 (×4): qty 1

## 2021-07-04 MED ORDER — ASPIRIN 81 MG PO TBEC
81.0000 mg | DELAYED_RELEASE_TABLET | Freq: Every day | ORAL | Status: DC
Start: 2021-07-04 — End: 2021-07-07
  Administered 2021-07-04 – 2021-07-07 (×4): 81 mg via ORAL
  Filled 2021-07-04 (×4): qty 1

## 2021-07-04 MED ORDER — ACETAMINOPHEN 325 MG PO TABS
650.0000 mg | ORAL_TABLET | Freq: Four times a day (QID) | ORAL | Status: DC | PRN
  Administered 2021-07-06: 650 mg via ORAL
  Filled 2021-07-04: qty 2

## 2021-07-04 MED ORDER — PANTOPRAZOLE SODIUM 40 MG PO TBEC
40.0000 mg | DELAYED_RELEASE_TABLET | Freq: Every day | ORAL | Status: DC
  Administered 2021-07-04 – 2021-07-07 (×4): 40 mg via ORAL
  Filled 2021-07-04 (×4): qty 1

## 2021-07-04 MED ORDER — ONDANSETRON HCL 4 MG PO TABS: 4.0000 mg | ORAL_TABLET | Freq: Four times a day (QID) | ORAL | Status: DC | PRN

## 2021-07-04 MED ORDER — FUROSEMIDE 10 MG/ML IJ SOLN
60.0000 mg | Freq: Two times a day (BID) | INTRAMUSCULAR | Status: DC
  Administered 2021-07-04 – 2021-07-06 (×5): 60 mg via INTRAVENOUS
  Filled 2021-07-04 (×5): qty 6

## 2021-07-04 MED ORDER — NITROGLYCERIN IN D5W 200-5 MCG/ML-% IV SOLN
40.0000 ug/min | INTRAVENOUS | Status: DC
  Administered 2021-07-05: 55 ug/min via INTRAVENOUS
  Filled 2021-07-04: qty 250

## 2021-07-04 MED ORDER — METOPROLOL SUCCINATE ER 50 MG PO TB24
50.0000 mg | ORAL_TABLET | Freq: Every day | ORAL | Status: DC
  Administered 2021-07-04 – 2021-07-07 (×4): 50 mg via ORAL
  Filled 2021-07-04 (×4): qty 1

## 2021-07-04 MED ORDER — ADULT MULTIVITAMIN W/MINERALS CH
1.0000 | ORAL_TABLET | Freq: Every day | ORAL | Status: DC
  Administered 2021-07-04 – 2021-07-07 (×4): 1 via ORAL
  Filled 2021-07-04 (×4): qty 1

## 2021-07-04 MED ORDER — CHLORHEXIDINE GLUCONATE CLOTH 2 % EX PADS
6.0000 | MEDICATED_PAD | Freq: Every day | CUTANEOUS | Status: DC
  Administered 2021-07-04 – 2021-07-07 (×4): 6 via TOPICAL

## 2021-07-04 MED ORDER — NITROGLYCERIN IN D5W 200-5 MCG/ML-% IV SOLN
INTRAVENOUS | Status: AC
  Administered 2021-07-04: 40 ug/min via INTRAVENOUS
  Filled 2021-07-04: qty 250

## 2021-07-04 MED ORDER — SODIUM CHLORIDE 0.9% FLUSH
3.0000 mL | Freq: Two times a day (BID) | INTRAVENOUS | Status: DC
  Administered 2021-07-04 – 2021-07-06 (×6): 3 mL via INTRAVENOUS

## 2021-07-04 MED ORDER — ASPIRIN 81 MG PO TBEC: 81.0000 mg | DELAYED_RELEASE_TABLET | Freq: Every day | ORAL | Status: DC

## 2021-07-04 MED ORDER — SODIUM CHLORIDE 0.9 % IV SOLN: 250.0000 mL | INTRAVENOUS | Status: DC | PRN

## 2021-07-04 MED ORDER — PANTOPRAZOLE SODIUM 40 MG PO TBEC: 40.0000 mg | DELAYED_RELEASE_TABLET | Freq: Every day | ORAL | Status: DC

## 2021-07-04 MED ORDER — SODIUM CHLORIDE 0.9% FLUSH: 3.0000 mL | INTRAVENOUS | Status: DC | PRN

## 2021-07-04 MED ORDER — INSULIN ASPART 100 UNIT/ML IJ SOLN
0.0000 [IU] | Freq: Three times a day (TID) | INTRAMUSCULAR | Status: DC
  Administered 2021-07-05: 5 [IU] via SUBCUTANEOUS
  Administered 2021-07-05: 2 [IU] via SUBCUTANEOUS
  Administered 2021-07-06: 5 [IU] via SUBCUTANEOUS
  Administered 2021-07-06: 3 [IU] via SUBCUTANEOUS

## 2021-07-04 MED ORDER — CLOPIDOGREL BISULFATE 75 MG PO TABS
75.0000 mg | ORAL_TABLET | Freq: Every day | ORAL | Status: DC
  Administered 2021-07-04 – 2021-07-07 (×4): 75 mg via ORAL
  Filled 2021-07-04 (×4): qty 1

## 2021-07-04 MED ORDER — ACETAMINOPHEN 650 MG RE SUPP: 650.0000 mg | Freq: Four times a day (QID) | RECTAL | Status: DC | PRN

## 2021-07-04 MED ORDER — INSULIN ASPART 100 UNIT/ML IJ SOLN
0.0000 [IU] | Freq: Every day | INTRAMUSCULAR | Status: DC
  Administered 2021-07-04: 3 [IU] via SUBCUTANEOUS
  Administered 2021-07-05: 2 [IU] via SUBCUTANEOUS

## 2021-07-04 MED ORDER — FUROSEMIDE 10 MG/ML IJ SOLN
80.0000 mg | Freq: Once | INTRAMUSCULAR | Status: AC
  Administered 2021-07-04: 80 mg via INTRAVENOUS
  Filled 2021-07-04: qty 8

## 2021-07-04 NOTE — Assessment & Plan Note (Signed)
-   Sinus rhythm currently -Continue the use of Toprol -Patient using aspirin and Plavix.

## 2021-07-04 NOTE — Assessment & Plan Note (Signed)
-   In the setting of chronic renal failure -No overt bleeding appreciated -No transfusion needed -Continue following hemoglobin trend.

## 2021-07-04 NOTE — Assessment & Plan Note (Signed)
-  Continue statins ?

## 2021-07-04 NOTE — Progress Notes (Signed)
BIPAP on standby at this time. Doing well on 3 lpm nasal cannula sats 100%.

## 2021-07-04 NOTE — Assessment & Plan Note (Signed)
-   Status post left AKA -Update A1c -Hold oral hypoglycemic agents while inpatient -Continue sliding scale insulin. -Follow CBGs and adjust regimen as needed.

## 2021-07-04 NOTE — Assessment & Plan Note (Addendum)
-   Last ejection fraction 25-30% -Appears to be associated with medication noncompliance and diet indiscretion -On presentation with significant hypertensive urgency and the need for BiPAP and nitroglycerin drip. -After medications and initiation of IV Lasix therapy blood pressure stabilizes and patient breathing improved. -Over 6 months since last echo; will update. -Follow daily weights, strict I's and O's, low-sodium diet, Reds CLipp protocol and clinical response. -Started on IV Lasix. -Continue Toprol -Holding ARB in the setting of acute exacerbation and slightly worsening in his creatinine.

## 2021-07-04 NOTE — H&P (Signed)
History and Physical    Patient: Allen Yu DOB: May 20, 1945 DOA: 07/04/2021 DOS: the patient was seen and examined on 07/04/2021 PCP: [name], MD  Patient coming from: Home  Chief Complaint:  Chief Complaint  Patient presents with   Respiratory Distress   HPI: Allen Yu is a 76 y.o. male with extensive medical history significant, but not limited to chronic systolic heart failure, diabetes with nephropathy, chronic kidney disease stage IIIb, gastroesophageal reflux disease, paroxysmal atrial fibrillation, schizophrenia, hyperlipidemia, peripheral vascular disease (status post left AKA); who presented to the hospital secondary to increased shortness of breath.  Per wife reports patient's symptoms started sudden the in the last 24 hours or so prior to admission.  There was concerns for lack of medication compliance and diet indiscretion.    He denies any chest pain, fever, nausea, vomiting, focal weakness, overt bleeding, dysuria, hematuria or any other complaints.  On presentation patient found with hypertensive urgency, tripoding and increase respiratory distress.  He required to be placed on BiPAP and nitroglycerin drip.  IV Lasix started and TRH consulted to place in the hospital for further evaluation and management.  Review of Systems: As mentioned in the history of present illness. All other systems reviewed and are negative. Past Medical History:  Diagnosis Date   Anemia    Arthritis    Carotid stenosis    Chronic back pain    Chronic HFrEF (heart failure with reduced ejection fraction) ([place])    a. 09/2020 Echo: EF 45%, mild LVH; b. 12/2020 Echo: EF 25-30%, glob HK, mod LVH, mildly reduced RV fxn, RVSP 65.43mHg, mod BAE. Triv effusion. Mod MR/TR.   CKD (chronic kidney disease), stage III ([facility]    Constipation    Dementia ([facility] [place]    Diabetes mellitus    Dilated cardiomyopathy ([facility]    a. 09/2020 Echo: EF 45%; b. 12/2020 Echo: EF 25-30%.   Foot drop, right     GERD (gastroesophageal reflux disease)    History of kidney stones    Hypertension    Lung nodule    PAF (paroxysmal atrial fibrillation) (HCC)    Peripheral neuropathy    Peripheral vascular disease ([facility]    a. 06/2020 s/p L AKA; b. 10/2020 s/p R SFA and above-knee popliteal PTA.   PTSD (post-traumatic stress disorder)    Schizophrenia ([facility]    Tuberculosis    Treated   Past Surgical History:  Procedure Laterality Date   ABDOMINAL AORTOGRAM W/LOWER EXTREMITY N/A 05/20/2020   Procedure: ABDOMINAL AORTOGRAM W/LOWER EXTREMITY;  Surgeon: FElam Dutch MD;  [facility];  Service: Cardiovascular;  Laterality: N/A;   ABDOMINAL AORTOGRAM W/LOWER EXTREMITY N/A 10/27/2020   Procedure: ABDOMINAL AORTOGRAM W/LOWER EXTREMITY;  Surgeon: CMarty Heck MD;  [facility];  Service: Cardiovascular;  Laterality: N/A;   AMPUTATION Left 06/28/2020   Procedure: AMPUTATION ABOVE KNEE LEFT;  Surgeon: ERosetta Posner MD;  [facility]  Service: Vascular;  Laterality: Left;   ALearned 2000, 2013   x2   BLADDER SURGERY     02   CATARACT EXTRACTION W/PHACO Right 05/12/2021   Procedure: CATARACT EXTRACTION PHACO AND INTRAOCULAR LENS PLACEMENT ([place];  Surgeon: WBaruch Goldmann MD;  Location: [facility];  Service: Ophthalmology;  Laterality: Right;  CDE: 27.28   CATARACT EXTRACTION W/PHACO Left 05/29/2021   Procedure: CATARACT EXTRACTION PHACO AND INTRAOCULAR LENS PLACEMENT ([facility]);  Surgeon: WBaruch Goldmann MD;  Location: [facility];  Service: Ophthalmology;  Laterality: Left;  CDE  12.96   CHOLECYSTECTOMY     COLONOSCOPY     COLONOSCOPY N/A 12/07/2014   Procedure: COLONOSCOPY;  Surgeon: Daneil Dolin, MD;  [facility];  Service: Endoscopy;  Laterality: N/A;  10:30 Am   EYE SURGERY Bilateral    removed metal from eye   FEMORAL-TIBIAL BYPASS GRAFT Left 05/27/2020   Procedure: LEFT FEMORAL TO PERONEAL ARTERY BYPASS;  Surgeon: Rosetta Posner, MD;   Location: [place];  Service: Vascular;  Laterality: Left;   HERNIA REPAIR Right    INGUINAL HERNIA REPAIR Left 11/03/2013   Procedure: HERNIA REPAIR INGUINAL ADULT;  Surgeon: Gayland Curry, MD;  Location: [facility];  Service: General;  Laterality: Left;   PERIPHERAL VASCULAR INTERVENTION  10/27/2020   Procedure: PERIPHERAL VASCULAR INTERVENTION;  Surgeon: Marty Heck, MD;  Location: [facility];  Service: Cardiovascular;;   SHOULDER SURGERY     RIGHT SHOULDER    VIDEO BRONCHOSCOPY WITH ENDOBRONCHIAL NAVIGATION N/A 07/10/2019   Procedure: VIDEO BRONCHOSCOPY WITH ENDOBRONCHIAL NAVIGATION;  Surgeon: Melrose Nakayama, MD;  Location: [place];  Service: Thoracic;  Laterality: N/A;   Social History:  reports that he has been smoking cigarettes. He has a 22.50 pack-year smoking history. He has never used smokeless tobacco. He reports current drug use. Drug: Marijuana. He reports that he does not drink alcohol.  Allergies  Allergen Reactions   Bee Venom Anaphylaxis   Codeine Other (See Comments)    incoherent  Other reaction(s): Delirium   Propoxyphene Other (See Comments)    Dizziness, "Makes me feel drunk" Other reaction(s): Dizziness   Valsartan Other (See Comments)    incoherent Other reaction(s): Delirium    Family History  Problem Relation Age of Onset   Heart disease Mother        before age 4    Prior to Admission medications   Medication Sig Start Date End Date Taking? Authorizing Provider  acetaminophen (TYLENOL) 500 MG tablet Take 500 mg by mouth 2 (two) times daily as needed for moderate pain. 03/07/21  Yes [provider]  aspirin EC 81 MG tablet Take 1 tablet (81 mg total) by mouth daily with breakfast. 01/19/21 01/19/22 Yes Emokpae, Courage, MD  clopidogrel (PLAVIX) 75 MG tablet Take 1 tablet (75 mg total) by mouth daily. 01/19/21 01/19/22 Yes Emokpae, Courage, MD  donepezil (ARICEPT) 10 MG tablet Take 10 mg by mouth at bedtime.   Yes [provider]  ferrous sulfate 325 (65 FE) MG tablet Take 325 mg by mouth daily with breakfast. 03/07/21  Yes [provider]  gabapentin (NEURONTIN) 100 MG capsule Take 100 mg by mouth daily as needed (pain). 11/06/19  Yes [provider]  LANTUS 100 UNIT/ML injection Inject 0.06 mLs (6 Units total) into the skin at bedtime as needed (High blood glucose). If Blood glucose over 200 Patient taking differently: Inject 15 Units into the skin daily. 01/19/21  Yes Emokpae, Courage, MD  losartan (COZAAR) 25 MG tablet Take 1 tablet (25 mg total) by mouth daily. 06/09/21 06/04/22 Yes Strader, Fransisco Hertz, PA-C  metoprolol succinate (TOPROL-XL) 50 MG 24 hr tablet Take 1 tablet (50 mg total) by mouth daily. Take with or immediately following a meal. 06/26/21 06/21/22 Yes Strader, [place] M, PA-C  Multiple Vitamins-Minerals (MULTIVITAMIN WITH MINERALS) tablet Take 1 tablet by mouth daily. 01/19/21 01/19/22 Yes Emokpae, Courage, MD  omeprazole (PRILOSEC) 20 MG capsule Take 1 capsule by mouth  daily. 03/23/21  Yes [provider]  ondansetron (ZOFRAN) 4 MG tablet Take 1 tablet (4 mg total) by mouth every 6 (six) hours as needed for nausea. 01/19/21  Yes Emokpae, Courage, MD  risperiDONE (RISPERDAL) 0.5 MG tablet Take 1 tablet (0.5 mg total) by mouth every 12 (twelve) hours as needed (Restlessness and agitation). 01/19/21  Yes Emokpae, Courage, MD  torsemide (DEMADEX) 20 MG tablet Take 1.5 tablets (30 mg total) by mouth daily. 01/25/21  Yes Barton Dubois, MD  traMADol (ULTRAM) 50 MG tablet Take 50 mg by mouth every 6 (six) hours as needed for moderate pain. 07/18/20  Yes [provider]  Alcohol Swabs (ALCOHOL PADS) 70 % PADS USE 1 PAD AS DIRECTED 10/12/20 10/13/21  [provider]  B-D UF III MINI PEN NEEDLES 31G X 5 MM MISC SMARTSIG:1 Each SUB-Q Daily 12/23/19   [provider]  feeding supplement, ENSURE COMPLETE, (ENSURE COMPLETE) LIQD Take 237 mLs by mouth 2 (two) times  daily between meals. 01/19/21   Roxan Hockey, MD    Physical Exam: Vitals:   07/04/21 1420 07/04/21 1445 07/04/21 1500 07/04/21 1515  BP: 128/71 (!) 142/67 139/73   Pulse:  95 83   Resp: '13 15 18   '$ Temp:    (!) 97.4 F (36.3 C)  TempSrc:    Oral  SpO2:  94% 100%   Weight:       General exam: Alert, awake, oriented x 3; no hallucinations, no chest pain, no nausea, no vomiting.  Increase work of breathing appreciated. Respiratory system: Positive crackles, bilateral rhonchi; positive tachypnea present on admission.  More comfortable while wearing BiPAP. Cardiovascular system: Rate controlled, no rubs, no gallops, positive mild JVD.   Gastrointestinal system: Abdomen is nondistended, soft and nontender. No organomegaly or masses felt. Normal bowel sounds heard. Central nervous system: No focal neurological deficits. Extremities: No cyanosis or clubbing; left AKA. Skin: No petechiae. Psychiatry: Judgement and insight appear normal. Mood & affect appropriate.   Data Reviewed: CBC: WBCs 9.6, hemoglobin 9.4, and platelet count 452K Comprehensive metabolic panel: Sodium 734, potassium 5.1, BUN 31, creatinine 1.85; normal LFTs; GFR 38. High sensitive troponin 25 >> 26 Magnesium 2.2 Phosphorus 3.8 TSH 1.155  Assessment and Plan: * Acute on chronic systolic CHF (congestive heart failure) (HCC) - Last ejection fraction 25-30% -Appears to be associated with medication noncompliance and diet indiscretion -On presentation with significant hypertensive urgency and the need for BiPAP and nitroglycerin drip. -After medications and initiation of IV Lasix therapy blood pressure stabilizes and patient breathing improved. -Over 6 months since last echo; will update. -Follow daily weights, strict I's and O's, low-sodium diet, Reds CLipp protocol and clinical response. -Started on IV Lasix. -Continue Toprol -Holding ARB in the setting of acute exacerbation and slightly worsening in his  creatinine.  Schizophrenia, unspecified ([place]) - Continue the use of Risperdal -Following commands appropriately -No hallucinations.  Anemia - In the setting of chronic renal failure -No overt bleeding appreciated -No transfusion needed -Continue following hemoglobin trend.  Acute kidney injury superimposed on chronic kidney disease ([place]) - Patient with chronic kidney disease a stage IIIb at baseline -Very likely mild acute kidney injury in the setting of decreased perfusion with CHF exacerbation -Holding Cozaar initially -Provide Lasix for volume stabilization -Control blood pressure and follow renal function trend.  Unspecified atrial fibrillation (HCC) - Sinus rhythm currently -Continue the use of Toprol -Patient using aspirin and Plavix.  Hyperlipemia - Continue statins.  S/P AKA (above knee amputation) unilateral,  left ([place]) - Appears to be stable; stump without any open wounds. -Continue treatment with aspirin/Plavix and good control of diabetes.  Type 2 diabetes mellitus with other circulatory complications (HCC) - Status post left AKA -Update A1c -Hold oral hypoglycemic agents while inpatient -Continue sliding scale insulin. -Follow CBGs and adjust regimen as needed.  GERD (gastroesophageal reflux disease) - Continue PPI.      Advance Care Planning:   Code Status: DNR   Consults: None  Family Communication: No family at bedside.  Severity of Illness: The appropriate patient status for this patient is INPATIENT. Inpatient status is judged to be reasonable and necessary in order to provide the required intensity of service to ensure the patient's safety. The patient's presenting symptoms, physical exam findings, and initial radiographic and laboratory data in the context of their chronic comorbidities is felt to place them at high risk for further clinical deterioration. Furthermore, it is not anticipated that the patient will be medically stable for discharge  from the hospital within 2 midnights of admission.   * I certify that at the point of admission it is my clinical judgment that the patient will require inpatient hospital care spanning beyond 2 midnights from the point of admission due to high intensity of service, high risk for further deterioration and high frequency of surveillance required.*  Author: Barton Dubois, MD 07/04/2021 4:01 PM  For on call review [email or web address].

## 2021-07-04 NOTE — Assessment & Plan Note (Signed)
-   Patient with chronic kidney disease a stage IIIb at baseline -Very likely mild acute kidney injury in the setting of decreased perfusion with CHF exacerbation -Holding Cozaar initially -Provide Lasix for volume stabilization -Control blood pressure and follow renal function trend.

## 2021-07-04 NOTE — ED Notes (Signed)
Echo at bedside

## 2021-07-04 NOTE — ED Triage Notes (Signed)
Patient via EMS due to respiratory distress that started this morning. Patient has labored breathing and on NRB oxygen saturations 80 percent.

## 2021-07-04 NOTE — Progress Notes (Deleted)
Since patient's return from [facility] IR w/percutaneous chole drain placed he is consistently groaning & moaning out in pain, when touched or providing patient care it's even worse, patient has Morphine IV but no change noted since given last, MD notified & new pain medication ordered

## 2021-07-04 NOTE — Assessment & Plan Note (Signed)
Continue PPI ?

## 2021-07-04 NOTE — Assessment & Plan Note (Signed)
-   Appears to be stable; stump without any open wounds. -Continue treatment with aspirin/Plavix and good control of diabetes.

## 2021-07-04 NOTE — Assessment & Plan Note (Signed)
-   Continue the use of Risperdal -Following commands appropriately -No hallucinations.

## 2021-07-04 NOTE — Progress Notes (Signed)
*  PRELIMINARY RESULTS* Echocardiogram 2D Echocardiogram has been performed.  Allen Yu 07/04/2021, 4:23 PM

## 2021-07-04 NOTE — ED Notes (Addendum)
Echo going to wait until transfer to ICU. Respiratory at bedside for transfer.

## 2021-07-05 DIAGNOSIS — I5023 Acute on chronic systolic (congestive) heart failure: Secondary | ICD-10-CM | POA: Diagnosis not present

## 2021-07-05 LAB — BASIC METABOLIC PANEL
Anion gap: 6 (ref 5–15)
BUN: 33 mg/dL — ABNORMAL HIGH (ref 8–23)
CO2: 30 mmol/L (ref 22–32)
Calcium: 8.6 mg/dL — ABNORMAL LOW (ref 8.9–10.3)
Chloride: 101 mmol/L (ref 98–111)
Creatinine, Ser: 1.75 mg/dL — ABNORMAL HIGH (ref 0.61–1.24)
GFR, Estimated: 40 mL/min — ABNORMAL LOW (ref >60)
Glucose, Bld: 72 mg/dL (ref 70–99)
Potassium: 4.4 mmol/L (ref 3.5–5.1)
Sodium: 137 mmol/L (ref 135–145)

## 2021-07-05 LAB — GLUCOSE, CAPILLARY
Glucose-Capillary: 57 mg/dL — ABNORMAL LOW (ref 70–99)
Glucose-Capillary: 144 mg/dL — ABNORMAL HIGH (ref 70–99)
Glucose-Capillary: 228 mg/dL — ABNORMAL HIGH (ref 70–99)
Glucose-Capillary: 207 mg/dL — ABNORMAL HIGH (ref 70–99)

## 2021-07-05 LAB — MRSA NEXT GEN BY PCR, NASAL: MRSA by PCR Next Gen: NOT DETECTED

## 2021-07-05 NOTE — Progress Notes (Signed)
PROGRESS NOTE     Allen Yu, is a 76 y.o. male, DOB - 1945-06-15, XAJ:[id number]  Admit date - 07/04/2021   Admitting Physician Barton Dubois, MD  Outpatient Primary MD for the patient is Allen Noble, MD  LOS - 1  Chief Complaint  Patient presents with   Respiratory Distress        Brief Narrative:   76 y.o. 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 -Continue IV Lasix. -Continue Toprol -Holding ARB in the setting of acute exacerbation and slightly worsening in his creatinine.  Schizophrenia/dementia - Continue PTA Aricept -Following commands appropriately -No hallucinations.  Anemia - In the setting of chronic renal failure -No overt bleeding appreciated -Continue following hemoglobin trend.  Acute kidney injury superimposed on chronic kidney disease ([place]) - Patient with chronic kidney disease a stage IIIb at baseline -Very likely mild acute kidney injury in the setting of decreased perfusion with CHF exacerbation -Holding Cozaar initially -Provide Lasix for volume stabilization -Control blood pressure and follow renal function trend.  Unspecified atrial fibrillation (HCC) - Sinus rhythm currently -Continue the use of Toprol -Patient using aspirin and Plavix.  PAD/S/P AKA (above  knee amputation) unilateral, left (HCC) - Appears to be stable; Lt AKA stump without any open wounds. -Continue treatment with aspirin/Plavix and good control of diabetes.  Type 2 diabetes mellitus with other circulatory complications (HCC) - Status post left AKA -Hold oral hypoglycemic agents while inpatient Use Novolog/Humalog Sliding scale insulin with Accu-Cheks/Fingersticks as ordered   GERD (gastroesophageal reflux disease) - Continue PPI.  Acute hypoxic respiratory failure----currently on 2 L of oxygen via nasal cannula -He has used O2 in the past as needed but apparently was not using O2 prior to admission -Suspect this is due to CHF exacerbation  Disposition/Need for in-Hospital Stay- patient unable to be discharged at this time due to acute systolic CHF exacerbation with hypoxic respiratory failure requiring IV diuresis  Status is: Inpatient   Disposition: The patient is from: Home              Anticipated d/c is to: Home              Anticipated d/c date is: 2 days              Patient currently is not medically stable to d/c. Barriers: Not Clinically Stable-   Code Status :  -  Code Status: DNR   Family Communication:   Wife is primary contact  DVT Prophylaxis  :   - SCDs   enoxaparin (LOVENOX) injection 30 mg Start: 07/04/21 1800   Lab Results  Component Value Date   PLT 452 (H) 07/04/2021    Inpatient Medications  Scheduled Meds:  aspirin EC  81 mg Oral Q breakfast   Chlorhexidine Gluconate Cloth  6 each Topical Daily   clopidogrel  75 mg Oral Daily   donepezil  10 mg Oral QHS   enoxaparin (LOVENOX) injection  30 mg Subcutaneous  Q24H   ferrous sulfate  325 mg Oral Q breakfast   furosemide  60 mg Intravenous BID   insulin aspart  0-15 Units Subcutaneous TID WC   insulin aspart  0-5 Units Subcutaneous QHS   insulin detemir  12 Units Subcutaneous QHS   metoprolol succinate  50 mg Oral Q breakfast   multivitamin with minerals  1 tablet Oral Daily    pantoprazole  40 mg Oral Daily   sodium chloride flush  3 mL Intravenous Q12H   Continuous Infusions:  sodium chloride     nitroGLYCERIN Stopped (07/05/21 0745)   PRN Meds:.sodium chloride, acetaminophen **OR** acetaminophen, ondansetron **OR** ondansetron (ZOFRAN) IV, risperiDONE, sodium chloride flush   Anti-infectives (From admission, onward)    None         Subjective: Babs Bertin today has no fevers,   No chest pain,   - Diuresing well - No Nausea, Vomiting or Diarrhea No cough -Dyspnea improving hypoxia persist  Objective: Vitals:   07/05/21 0700 07/05/21 0800 07/05/21 0827 07/05/21 1129  BP: (!) 113/43 (!) 168/72 (!) 158/70   Pulse:  87 78   Resp: 15 (!) 21 18   Temp:   98.1 F (36.7 C) 97.8 F (36.6 C)  TempSrc:   Oral Oral  SpO2:  100%    Weight:   54.7 kg     Intake/Output Summary (Last 24 hours) at 07/05/2021 1622 Last data filed at 07/05/2021 1050 Gross per 24 hour  Intake 369.91 ml  Output 1201 ml  Net -831.09 ml   Filed Weights   07/04/21 1057 07/05/21 0500 07/05/21 0827  Weight: 53.5 kg 54.7 kg 54.7 kg    Physical Exam  Gen:- Awake Alert, appears comfortable HEENT:- [place].AT, No sclera icterus Nose-  2L/min Ears---HOH Neck-Supple Neck,No JVD,.  Lungs-diminished breath sounds, faint bibasilar rales  CV- S1, S2 normal, regular  Abd-  +ve B.Sounds, Abd Soft, No tenderness,    Extremity/Skin:- No  edema, pedal pulses present  Psych-underlying memory and cognitive deficits , as per wife appears to be at baseline  neuro-no new focal deficits, no tremors  Data Reviewed: I have personally reviewed following labs and imaging studies  CBC: Recent Labs  Lab 07/04/21 1058  WBC 9.6  NEUTROABS 5.7  HGB 9.4*  HCT 30.0*  MCV 101.0*  PLT 782*   Basic Metabolic Panel: Recent Labs  Lab 07/04/21 1058 07/04/21 1346 07/05/21 0355  NA 137  --  137  K 5.1  --  4.4  CL 103  --  101  CO2 30  --  30  GLUCOSE 262*  --  72  BUN 31*  --  33*   CREATININE 1.85* 1.84* 1.75*  CALCIUM 8.7*  --  8.6*  MG  --  2.2  --   PHOS  --  3.8  --    GFR: Estimated Creatinine Clearance: 28.2 mL/min (A) (by C-G formula based on SCr of 1.75 mg/dL (H)). Liver Function Tests: Recent Labs  Lab 07/04/21 1058  AST 23  ALT 18  ALKPHOS 126  BILITOT 0.9  PROT 8.3*  ALBUMIN 3.8   Cardiac Enzymes: No results for input(s): "CKTOTAL", "CKMB", "CKMBINDEX", "TROPONINI" in the last 168 hours. BNP (last 3 results) No results for input(s): "PROBNP" in the last 8760 hours. HbA1C: Recent Labs    07/04/21 1409  HGBA1C 6.0*   Sepsis Labs: '@LABRCNTIP'$ (procalcitonin:4,lacticidven:4) ) Recent Results (from the past 240 hour(s))  MRSA Next Gen by PCR, Nasal     Status:  None   Collection Time: 07/04/21  3:30 PM   Specimen: Nasal Mucosa; Nasal Swab  Result Value Ref Range Status   MRSA by PCR Next Gen NOT DETECTED NOT DETECTED Final    Comment: (NOTE) The GeneXpert MRSA Assay (FDA approved for NASAL specimens only), is one component of a comprehensive MRSA colonization surveillance program. It is not intended to diagnose MRSA infection nor to guide or monitor treatment for MRSA infections. Test performance is not FDA approved in patients less than 75 years old. Performed at [facility], [address], [place], [place] [address]       Radiology Studies: ECHOCARDIOGRAM COMPLETE  Result Date: 07/04/2021    ECHOCARDIOGRAM REPORT   Patient Name:   SANDIP POWER Date of Exam: 07/04/2021 Medical Rec #:  [id number]      Height:       73.0 in Accession #:    [id number]     Weight:       118.0 lb Date of Birth:  February 01, 1945     BSA:          1.719 m Patient Age:    56 years       BP:           157/81 mmHg Patient Gender: M              HR:           83 bpm. Exam Location:  [facility] Procedure: 2D Echo, Cardiac Doppler and Color Doppler Indications:    CHF  History:        Patient has prior history of Echocardiogram examinations, most                 recent  01/14/2021. CHF, COPD, Arrythmias:Atrial Fibrillation,                 Signs/Symptoms:Bacteremia; Risk Factors:Hypertension, Diabetes                 and Current Smoker. PTSD, Dementia, Schizophrenia.  Sonographer:    Wenda Low Referring Phys: [place]  1. Left ventricular ejection fraction, by estimation, is 35 to 40%. The left ventricle has moderately decreased function. The left ventricle demonstrates global hypokinesis. Left ventricular diastolic parameters are consistent with Grade I diastolic dysfunction (impaired relaxation).  2. Right ventricular systolic function is normal. The right ventricular size is normal. There is moderately elevated pulmonary artery systolic pressure. The estimated right ventricular systolic pressure is 21.3 mmHg.  3. The mitral valve is grossly normal. Trivial mitral valve regurgitation. No evidence of mitral stenosis.  4. The aortic valve is tricuspid. There is mild calcification of the aortic valve. Aortic valve regurgitation is not visualized. Aortic valve sclerosis is present, with no evidence of aortic valve stenosis.  5. The inferior vena cava is dilated in size with >50% respiratory variability, suggesting right atrial pressure of 8 mmHg. Comparison(s): Changes from prior study are noted. EF has improved ~35-40%. FINDINGS  Left Ventricle: Left ventricular ejection fraction, by estimation, is 35 to 40%. The left ventricle has moderately decreased function. The left ventricle demonstrates global hypokinesis. The left ventricular internal cavity size was normal in size. There is no left ventricular hypertrophy. Left ventricular diastolic parameters are consistent with Grade I diastolic dysfunction (impaired relaxation). Right Ventricle: The right ventricular size is normal. No increase in right ventricular wall thickness. Right ventricular systolic function is normal. There is moderately elevated pulmonary artery systolic pressure. The tricuspid  regurgitant velocity  is 3.24 m/s, and with an assumed right atrial pressure of 8 mmHg, the estimated right ventricular systolic pressure is 81.1 mmHg. Left Atrium: Left atrial size was normal in size. Right Atrium: Right atrial size was normal in size. Pericardium: There is no evidence of pericardial effusion. Mitral Valve: The mitral valve is grossly normal. Trivial mitral valve regurgitation. No evidence of mitral valve stenosis. MV peak gradient, 3.4 mmHg. The mean mitral valve gradient is 2.0 mmHg. Tricuspid Valve: The tricuspid valve is grossly normal. Tricuspid valve regurgitation is trivial. No evidence of tricuspid stenosis. Aortic Valve: The aortic valve is tricuspid. There is mild calcification of the aortic valve. Aortic valve regurgitation is not visualized. Aortic valve sclerosis is present, with no evidence of aortic valve stenosis. Aortic valve mean gradient measures 2.0 mmHg. Aortic valve peak gradient measures 3.9 mmHg. Aortic valve area, by VTI measures 2.74 cm. Pulmonic Valve: The pulmonic valve was grossly normal. Pulmonic valve regurgitation is not visualized. No evidence of pulmonic stenosis. Aorta: The aortic root is normal in size and structure. Venous: The right lower pulmonary vein is normal. The inferior vena cava is dilated in size with greater than 50% respiratory variability, suggesting right atrial pressure of 8 mmHg. IAS/Shunts: The atrial septum is grossly normal. Additional Comments: There is a small pleural effusion in the left lateral region.  LEFT VENTRICLE PLAX 2D LVIDd:         4.90 cm      Diastology LVIDs:         4.20 cm      LV e' medial:    6.42 cm/s LV PW:         1.10 cm      LV E/e' medial:  10.9 LV IVS:        1.20 cm      LV e' lateral:   5.55 cm/s LVOT diam:     2.10 cm      LV E/e' lateral: 12.6 LV SV:         55 LV SV Index:   32 LVOT Area:     3.46 cm  LV Volumes (MOD) LV vol d, MOD A2C: 155.0 ml LV vol d, MOD A4C: 125.0 ml LV vol s, MOD A2C: 94.4 ml LV vol s,  MOD A4C: 75.8 ml LV SV MOD A2C:     60.6 ml LV SV MOD A4C:     125.0 ml LV SV MOD BP:      54.2 ml RIGHT VENTRICLE RV Basal diam:  3.30 cm RV Mid diam:    2.40 cm RV S prime:     12.20 cm/s TAPSE (M-mode): 2.2 cm LEFT ATRIUM             Index        RIGHT ATRIUM           Index LA diam:        3.70 cm 2.15 cm/m   RA Area:     17.60 cm LA Vol (A2C):   55.3 ml 32.16 ml/m  RA Volume:   51.30 ml  29.84 ml/m LA Vol (A4C):   47.2 ml 27.45 ml/m LA Biplane Vol: 53.0 ml 30.82 ml/m  AORTIC VALVE                    PULMONIC VALVE AV Area (Vmax):    2.66 cm     PV Vmax:       0.80 m/s AV Area (Vmean):  2.31 cm     PV Peak grad:  2.5 mmHg AV Area (VTI):     2.74 cm AV Vmax:           98.40 cm/s AV Vmean:          62.300 cm/s AV VTI:            0.202 m AV Peak Grad:      3.9 mmHg AV Mean Grad:      2.0 mmHg LVOT Vmax:         75.70 cm/s LVOT Vmean:        41.500 cm/s LVOT VTI:          0.160 m LVOT/AV VTI ratio: 0.79  AORTA Ao Root diam: 3.50 cm MITRAL VALVE               TRICUSPID VALVE MV Area (PHT): 4.31 cm    TR Peak grad:   42.0 mmHg MV Area VTI:   2.77 cm    TR Vmax:        324.00 cm/s MV Peak grad:  3.4 mmHg MV Mean grad:  2.0 mmHg    SHUNTS MV Vmax:       0.92 m/s    Systemic VTI:  0.16 m MV Vmean:      57.3 cm/s   Systemic Diam: 2.10 cm MV Decel Time: 176 msec MV E velocity: 69.80 cm/s MV A velocity: 83.10 cm/s MV E/A ratio:  0.84 Eleonore Chiquito MD Electronically signed by Eleonore Chiquito MD Signature Date/Time: 07/04/2021/4:47:17 PM    Final    DG Chest Portable 1 View  Result Date: 07/04/2021 CLINICAL DATA:  Respiratory distress EXAM: PORTABLE CHEST 1 VIEW COMPARISON:  Chest radiograph 05/16/2021 FINDINGS: Cardiomegaly is unchanged. The upper mediastinal contours are within normal limits. There is vascular congestion and suspected mild pulmonary interstitial edema. There is more confluent opacity projecting over the left lower lobe with a probable small left pleural effusion, new since 05/16/2021. There is  no other focal airspace disease. There is no significant right effusion. There is no pneumothorax. There is no acute osseous abnormality. IMPRESSION: 1. Patchy opacities in the left base and probable small left pleural effusion which may reflect pneumonia in the correct clinical setting, new since 05/16/2021. Recommend follow-up radiographs in 6-8 weeks to assess for resolution. 2. Vascular congestion and suspected mild pulmonary interstitial edema. Electronically Signed   By: Valetta Mole M.D.   On: 07/04/2021 11:19     Scheduled Meds:  aspirin EC  81 mg Oral Q breakfast   Chlorhexidine Gluconate Cloth  6 each Topical Daily   clopidogrel  75 mg Oral Daily   donepezil  10 mg Oral QHS   enoxaparin (LOVENOX) injection  30 mg Subcutaneous Q24H   ferrous sulfate  325 mg Oral Q breakfast   furosemide  60 mg Intravenous BID   insulin aspart  0-15 Units Subcutaneous TID WC   insulin aspart  0-5 Units Subcutaneous QHS   insulin detemir  12 Units Subcutaneous QHS   metoprolol succinate  50 mg Oral Q breakfast   multivitamin with minerals  1 tablet Oral Daily   pantoprazole  40 mg Oral Daily   sodium chloride flush  3 mL Intravenous Q12H   Continuous Infusions:  sodium chloride     nitroGLYCERIN Stopped (07/05/21 0745)     LOS: 1 day    Roxan Hockey M.D on 07/05/2021 at 4:22 PM  Go to [email or web address] - for contact info  [facility] - Office  [phone]  If 7PM-7AM, please contact night-coverage [email or web address] 07/05/2021, 4:22 PM

## 2021-07-05 NOTE — Inpatient Diabetes Management (Signed)
Inpatient Diabetes Program Recommendations  AACE/ADA: New Consensus Statement on Inpatient Glycemic Control   Target Ranges:  Prepandial:   less than 140 mg/dL      Peak postprandial:   less than 180 mg/dL (1-2 hours)      Critically ill patients:  140 - 180 mg/dL    Latest Reference Range & Units 07/04/21 10:55 07/04/21 21:54 07/05/21 07:41  Glucose-Capillary 70 - 99 mg/dL 244 (H) 230 (H) 57 (L)    Review of Glycemic Control  Diabetes history: DM2 Outpatient Diabetes medications: Lantus 6 units QHS if CBG over 200 mg/dl Current orders for Inpatient glycemic control: Levemir 12 units QHS, Novolog 0-15 units TID with meals, Novolog 0-5 units QHS  Inpatient Diabetes Program Recommendations:    Insulin: Fasting CBG 57 mg/dl this morning. Please consider decreasing Levemir to 5 units QHS.  Thanks, Barnie Alderman, RN, MSN, [place] Diabetes Coordinator Inpatient Diabetes Program [phone] (Team Pager from 8am to [place])

## 2021-07-05 NOTE — Consult Note (Signed)
[facility] CM Inpatient Consult  07/05/2021  Allen Yu [birth year] [id number]  [facility] CM)   Patient chart has been reviewed with noted high risk score for unplanned readmissions.  Patient is active with chronic care management RN CM with [facility] CM.  Plan: Will continue to follow for progression and disposition plans.   Of note, [facility] Care Management services does not replace or interfere with any services that are arranged by inpatient case management or social work.    Netta Cedars, MSN, RN [facility] Liaison Toll free office [phone]

## 2021-07-05 NOTE — Progress Notes (Incomplete)
Hypoglycemic Event  CBG: 57  Treatment: 4 oz juice/soda  Symptoms: None  Follow-up CBG: Time:*** CBG Result:***  Possible Reasons for Event: Inadequate meal intake and Medication regimen:    Comments/MD notified: Yes  Patient consumed 100% of his breakfast

## 2021-07-05 NOTE — TOC Initial Note (Signed)
Transition of Care [facility]) - Initial/Assessment Note    Patient Details  Name: Allen Yu MRN: [id number] Date of Birth: 1945-07-06  Transition of Care [facility]) CM/SW Contact:    Boneta Lucks, RN Phone Number: 07/05/2021, 12:10 PM  Clinical Narrative:     Patient admitted with acute on chronic systolic CHF. Patient lives at home with his wife, [name]. TOC spoke with [name], she had completed the training to be his Aide. She states he transfers himself to wheelchair. Every time he uses his Boot is causes blisters on his stump and he is diabetic. He has used Out patient PT and HHPT in the past. TOC to follow for needs.          Expected Discharge Plan: [place] Barriers to Discharge: Continued Medical Work up  Patient Goals and CMS Choice Patient states their goals for this hospitalization and ongoing recovery are:: to go home. CMS Medicare.gov Compare Post Acute Care list provided to:: Patient Represenative (must comment) Choice offered to / list presented to : Spouse  Expected Discharge Plan and Services Expected Discharge Plan: [place]      Prior Living Arrangements/Services   Lives with:: Spouse    Current home services: DME   Activities of Daily Living Home Assistive Devices/Equipment: CBG Meter, Wheelchair ADL Screening (condition at time of admission) Patient's cognitive ability adequate to safely complete daily activities?: Yes Is the patient deaf or have difficulty hearing?: Yes Does the patient have difficulty seeing, even when wearing glasses/contacts?: No Does the patient have difficulty concentrating, remembering, or making decisions?: Yes Patient able to express need for assistance with ADLs?: Yes Does the patient have difficulty dressing or bathing?: Yes Independently performs ADLs?: No Communication: Independent Dressing (OT): Needs assistance Is this a change from baseline?: Pre-admission baseline Grooming: Needs  assistance Is this a change from baseline?: Pre-admission baseline Feeding: Independent with device (comment) (Set-up) Bathing: Needs assistance Is this a change from baseline?: Pre-admission baseline Toileting: Dependent Is this a change from baseline?: Pre-admission baseline In/Out Bed: Dependent Is this a change from baseline?: Pre-admission baseline Walks in Home: Dependent Is this a change from baseline?: Pre-admission baseline Does the patient have difficulty walking or climbing stairs?: Yes Weakness of Legs: Right Weakness of Arms/Hands: None  Permission Sought/Granted  [place]    Emotional Assessment     Admission diagnosis:  Acute on chronic systolic CHF (congestive heart failure) ([place]) [I50.23] Patient Active Problem List   Diagnosis Date Noted   Hypokalemia 04/13/2021   Colitis 04/12/2021   Bacteremia 04/11/2021   Pneumonia 00/86/7619   Chronic systolic CHF (congestive heart failure) ([place]) 04/11/2021   Acute respiratory failure with hypoxia and hypercarbia ([place]) 01/23/2021   CKD (chronic kidney disease) stage 3, GFR 30-59 ml/min ([place]) 01/23/2021   Acute on chronic systolic CHF (congestive heart failure) ([place]) 01/23/2021   Acute respiratory failure with hypoxia ([place]) 01/13/2021   Encounter for fitting and adjustment of hearing aid 12/21/2020   Other specified problems related to psychosocial circumstances 11/21/2020   Sensorineural hearing loss, bilateral 11/21/2020   Tobacco dependence 10/06/2020   Benign prostatic hyperplasia with urinary obstruction 10/06/2020   Above knee amputation of left lower extremity ([place]) 10/06/2020   Other specified counseling 10/06/2020   Severe sepsis ([place]) 09/24/2020   Schizophrenia, unspecified ([place]) 09/24/2020   Unspecified dementia, unspecified severity, without behavioral disturbance, psychotic disturbance, mood disturbance, and anxiety ([place]) 09/24/2020   Lung nodule 09/24/2020   Indwelling Foley catheter present 09/24/2020  Peripheral vascular disease ([place]) 09/24/2020   Mass of testicle 09/24/2020   Problem related to unspecified psychosocial circumstances 08/01/2020   Hypermetropia 07/22/2020   Unspecified atrial fibrillation ([place]) 07/22/2020   Klebsiella sepsis ([place]) 07/22/2020   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   SIRS (systemic inflammatory response syndrome) ([place]) 79/39/0300   Acute metabolic encephalopathy 92/33/0076   Hyponatremia    S/P AKA (above knee amputation) unilateral, left ([place]) 06/28/2020   Gangrene of left foot ([place]) 06/28/2020   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 ([place]) 05/27/2020   Critical lower limb ischemia ([place]) 05/21/2020   Malnutrition ([place]) 05/19/2020   Plantar callus 12/24/2019   Cavitary pneumonia 09/14/2019   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   Unintentional weight loss 09/14/2019   Coronary artery calcification seen on CT scan 10/28/2018   Hilar adenopathy 04/10/2016   Lung nodule seen on imaging study 07/09/2015   Acute encephalopathy    Hypoglycemia due to insulin    Type 2 diabetes mellitus with other circulatory complications ([place]) 22/63/3354   Acute kidney injury superimposed on chronic kidney disease ([place]) 07/08/2015   Tobacco use disorder 07/08/2015   Diverticulosis of colon without hemorrhage    Left inguinal hernia 09/16/2013   Altered mental status 11/17/2012   Hypothermia 11/17/2012   Leukocytosis 11/17/2012   Chronic kidney disease, stage 3 unspecified ([place])    Post-traumatic stress disorder, chronic    GERD (gastroesophageal reflux disease)    Essential (primary) hypertension 09/10/2011   Anemia 09/10/2011   Diabetic neuropathy ([place]) 09/10/2011   DDD (degenerative disc disease), lumbar 09/10/2011   Psychosis ([place]) 09/10/2011   Diabetes mellitus type 2, uncontrolled 09/10/2011   Hyperlipemia 09/10/2011   Microalbuminuria 09/10/2011   PCP:  [name], MD Pharmacy:   [facility] - [address], [place] [address] Phone: [phone] Fax: [place], [place] [address] [place] [address] Phone: [phone] Fax: [place], [place] - [address] [place] [address] Phone: [phone] Fax: [phone]   Readmission Risk Interventions    04/17/2021    3:16 PM 01/25/2021    9:26 AM 01/18/2021    2:24 PM  Readmission Risk Prevention Plan  Transportation Screening Complete Complete Complete  Medication Review ([place]) Complete Complete Complete  PCP or Specialist appointment within 3-5 days of discharge   Complete  HRI or Home Care Consult Patient refused Complete Complete  SW Recovery Care/Counseling Consult Complete Complete   Palliative Care Screening Not Applicable Not [place] Not Applicable Not Applicable Patient Refused

## 2021-07-06 ENCOUNTER — Inpatient Hospital Stay (HOSPITAL_COMMUNITY): Payer: Medicare Other

## 2021-07-06 DIAGNOSIS — I5023 Acute on chronic systolic (congestive) heart failure: Secondary | ICD-10-CM | POA: Diagnosis not present

## 2021-07-06 LAB — CBC
HCT: 21.4 % — ABNORMAL LOW (ref 39.0–52.0)
Hemoglobin: 7.1 g/dL — ABNORMAL LOW (ref 13.0–17.0)
MCH: 32.1 pg (ref 26.0–34.0)
MCHC: 33.2 g/dL (ref 30.0–36.0)
MCV: 96.8 fL (ref 80.0–100.0)
Platelets: 294 10*3/uL (ref 150–400)
RBC: 2.21 MIL/uL — ABNORMAL LOW (ref 4.22–5.81)
RDW: 16 % — ABNORMAL HIGH (ref 11.5–15.5)
WBC: 7.2 10*3/uL (ref 4.0–10.5)
nRBC: 0 % (ref 0.0–0.2)

## 2021-07-06 LAB — BASIC METABOLIC PANEL
Anion gap: 7 (ref 5–15)
BUN: 37 mg/dL — ABNORMAL HIGH (ref 8–23)
CO2: 30 mmol/L (ref 22–32)
Calcium: 8.6 mg/dL — ABNORMAL LOW (ref 8.9–10.3)
Chloride: 97 mmol/L — ABNORMAL LOW (ref 98–111)
Creatinine, Ser: 1.87 mg/dL — ABNORMAL HIGH (ref 0.61–1.24)
GFR, Estimated: 37 mL/min — ABNORMAL LOW (ref >60)
Glucose, Bld: 117 mg/dL — ABNORMAL HIGH (ref 70–99)
Potassium: 4.3 mmol/L (ref 3.5–5.1)
Sodium: 134 mmol/L — ABNORMAL LOW (ref 135–145)

## 2021-07-06 LAB — RENAL FUNCTION PANEL
Albumin: 3 g/dL — ABNORMAL LOW (ref 3.5–5.0)
Anion gap: 7 (ref 5–15)
BUN: 37 mg/dL — ABNORMAL HIGH (ref 8–23)
CO2: 29 mmol/L (ref 22–32)
Calcium: 8.6 mg/dL — ABNORMAL LOW (ref 8.9–10.3)
Chloride: 97 mmol/L — ABNORMAL LOW (ref 98–111)
Creatinine, Ser: 1.83 mg/dL — ABNORMAL HIGH (ref 0.61–1.24)
GFR, Estimated: 38 mL/min — ABNORMAL LOW (ref >60)
Glucose, Bld: 115 mg/dL — ABNORMAL HIGH (ref 70–99)
Phosphorus: 2.9 mg/dL (ref 2.5–4.6)
Potassium: 4.2 mmol/L (ref 3.5–5.1)
Sodium: 133 mmol/L — ABNORMAL LOW (ref 135–145)

## 2021-07-06 LAB — GLUCOSE, CAPILLARY
Glucose-Capillary: 90 mg/dL (ref 70–99)
Glucose-Capillary: 208 mg/dL — ABNORMAL HIGH (ref 70–99)
Glucose-Capillary: 162 mg/dL — ABNORMAL HIGH (ref 70–99)
Glucose-Capillary: 175 mg/dL — ABNORMAL HIGH (ref 70–99)

## 2021-07-06 MED ORDER — SODIUM CHLORIDE 0.9 % IV SOLN
500.0000 mg | INTRAVENOUS | Status: DC
  Administered 2021-07-06: 500 mg via INTRAVENOUS
  Filled 2021-07-06: qty 5

## 2021-07-06 MED ORDER — SODIUM CHLORIDE 0.9 % IV SOLN
2.0000 g | INTRAVENOUS | Status: DC
  Administered 2021-07-06: 2 g via INTRAVENOUS
  Filled 2021-07-06: qty 20

## 2021-07-06 NOTE — Progress Notes (Signed)
PROGRESS NOTE     Allen Yu, is a 76 y.o. male, DOB - 11/27/1945, XQJ:[id number]  Admit date - 07/04/2021   Admitting Physician Barton Dubois, MD  Outpatient Primary MD for the patient is [name], MD  LOS - 2  Chief Complaint  Patient presents with   Respiratory Distress        Brief Narrative:   76 y.o. male with extensive medical history significant, but not limited to chronic systolic heart failure, diabetes with nephropathy, chronic kidney disease stage IIIb, gastroesophageal reflux disease, paroxysmal atrial fibrillation, schizophrenia, hyperlipidemia, peripheral vascular disease (status post left AKA) admitted with acute on chronic systolic dysfunction CHF exacerbation    -Assessment and Plan:  Acute on chronic systolic CHF (congestive heart failure) ([place]) - Last ejection fraction 25-30% -Concerns about dietary indiscretion and medication noncompliance PTA -Initially required IV nitro drip, currently off nitro drip -Hypoxia resolved -Dyspnea improved, -Chest x-ray from 07/06/2021 shows improved pulmonary edema -Continue IV Lasix for another 24 hours -Continue Toprol -Holding ARB in the setting of acute exacerbation and slightly worsening in his creatinine.  Schizophrenia/dementia - Continue PTA Aricept -Following commands appropriately -No hallucinations.  Anemia - In the setting of chronic renal failure -No overt bleeding appreciated -Continue following hemoglobin trend.  Acute kidney injury superimposed on chronic kidney disease ([place]) - Patient with chronic kidney disease a stage IIIb at baseline -Very likely mild acute kidney injury in the setting of decreased perfusion with CHF exacerbation -Holding Cozaar initially Continue IV Lasix -Control blood pressure and follow renal function trend.  Unspecified atrial fibrillation (HCC) - Sinus rhythm currently -Continue the use of Toprol -Patient using aspirin and Plavix.  PAD/S/P AKA (above knee  amputation) unilateral, left (HCC) - Appears to be stable; Lt AKA stump without any open wounds. -Continue treatment with aspirin/Plavix and good control of diabetes.  Type 2 diabetes mellitus with other circulatory complications (HCC) - Status post left AKA -Hold oral hypoglycemic agents while inpatient Use Novolog/Humalog Sliding scale insulin with Accu-Cheks/Fingersticks as ordered   GERD (gastroesophageal reflux disease) - Continue PPI.  Acute hypoxic respiratory failure----hypoxia resolved with IV diuresis -He has used O2 in the past as needed but apparently was not using O2 prior to admission  CAP-IV diuresis repeat chest x-ray on 07/06/2021 suggest community-acquired pneumonia -Treat empirically with IV Rocephin and azithromycin along with bronchodilators and mucolytic  Disposition/Need for in-Hospital Stay- patient unable to be discharged at this time due to acute systolic CHF exacerbation  requiring IV diuresis and community-acquired pneumonia requiring IV antibiotics  Status is: Inpatient   Disposition: The patient is from: Home              Anticipated d/c is to: Home              Anticipated d/c date is: 1 day              Patient currently is not medically stable to d/c. Barriers: Not Clinically Stable-   Code Status :  -  Code Status: DNR   Family Communication:   Wife is primary contact  DVT Prophylaxis  :   - SCDs   enoxaparin (LOVENOX) injection 30 mg Start: 07/04/21 1800   Lab Results  Component Value Date   PLT 294 07/06/2021    Inpatient Medications  Scheduled Meds:  aspirin EC  81 mg Oral Q breakfast   Chlorhexidine Gluconate Cloth  6 each Topical Daily   clopidogrel  75 mg Oral Daily   donepezil  10 mg Oral QHS   enoxaparin (LOVENOX) injection  30 mg Subcutaneous Q24H   ferrous sulfate  325 mg Oral Q breakfast   furosemide  60 mg Intravenous BID   insulin aspart  0-15 Units Subcutaneous TID WC   insulin aspart  0-5 Units Subcutaneous QHS    insulin detemir  12 Units Subcutaneous QHS   metoprolol succinate  50 mg Oral Q breakfast   multivitamin with minerals  1 tablet Oral Daily   pantoprazole  40 mg Oral Daily   sodium chloride flush  3 mL Intravenous Q12H   Continuous Infusions:  sodium chloride     azithromycin     cefTRIAXone (ROCEPHIN)  IV     nitroGLYCERIN Stopped (07/05/21 0737)   PRN Meds:.sodium chloride, acetaminophen **OR** acetaminophen, ondansetron **OR** ondansetron (ZOFRAN) IV, risperiDONE, sodium chloride flush   Anti-infectives (From admission, onward)    Start     Dose/Rate Route Frequency Ordered Stop   07/06/21 1915  cefTRIAXone (ROCEPHIN) 2 g in sodium chloride 0.9 % 100 mL IVPB        2 g 200 mL/hr over 30 Minutes Intravenous Every 24 hours 07/06/21 1821     07/06/21 1915  azithromycin (ZITHROMAX) 500 mg in sodium chloride 0.9 % 250 mL IVPB        500 mg 250 mL/hr over 60 Minutes Intravenous Every 24 hours 07/06/21 1821           Subjective: Allen Yu today has no fevers,   No chest pain,   - Hypoxia resolved, cough and shortness of breath improving -Urine output with Foley good  Objective: Vitals:   07/06/21 0550 07/06/21 0738 07/06/21 1225 07/06/21 1500  BP:   (!) 171/82 137/80  Pulse: 84  78 80  Resp: 14  19 (!) 21  Temp:  98.2 F (36.8 C)    TempSrc:  Oral    SpO2: 100%  95% 94%  Weight:        Intake/Output Summary (Last 24 hours) at 07/06/2021 1821 Last data filed at 07/06/2021 1713 Gross per 24 hour  Intake 544.38 ml  Output 4400 ml  Net -3855.62 ml   Filed Weights   07/05/21 0500 07/05/21 0827 07/06/21 0433  Weight: 54.7 kg 54.7 kg 50.5 kg    Physical Exam  Gen:- Awake Alert, appears comfortable HEENT:- [place].AT, No sclera icterus Nose-weaned off oxygen Ears---HOH Neck-Supple Neck,No JVD,.  Lungs-improving breath sounds, no wheezing  CV- S1, S2 normal, regular  Abd-  +ve B.Sounds, Abd Soft, No tenderness,    Extremity/Skin:- No  edema, pedal pulses present   Psych-underlying memory and cognitive deficits , as per wife appears to be at baseline  neuro-no new focal deficits, no tremors GU-Foley with good urine output  Data Reviewed: I have personally reviewed following labs and imaging studies  CBC: Recent Labs  Lab 07/04/21 1058 07/06/21 0417  WBC 9.6 7.2  NEUTROABS 5.7  --   HGB 9.4* 7.1*  HCT 30.0* 21.4*  MCV 101.0* 96.8  PLT 452* 332   Basic Metabolic Panel: Recent Labs  Lab 07/04/21 1058 07/04/21 1346 07/05/21 0355 07/06/21 0417  NA 137  --  137 133*  134*  K 5.1  --  4.4 4.2  4.3  CL 103  --  101 97*  97*  CO2 30  --  '30 29  30  '$ GLUCOSE 262*  --  72 115*  117*  BUN 31*  --  33* 37*  37*  CREATININE 1.85*  1.84* 1.75* 1.83*  1.87*  CALCIUM 8.7*  --  8.6* 8.6*  8.6*  MG  --  2.2  --   --   PHOS  --  3.8  --  2.9   GFR: Estimated Creatinine Clearance: 24.4 mL/min (A) (by C-G formula based on SCr of 1.87 mg/dL (H)). Liver Function Tests: Recent Labs  Lab 07/04/21 1058 07/06/21 0417  AST 23  --   ALT 18  --   ALKPHOS 126  --   BILITOT 0.9  --   PROT 8.3*  --   ALBUMIN 3.8 3.0*   Cardiac Enzymes: No results for input(s): "CKTOTAL", "CKMB", "CKMBINDEX", "TROPONINI" in the last 168 hours. BNP (last 3 results) No results for input(s): "PROBNP" in the last 8760 hours. HbA1C: Recent Labs    07/04/21 1409  HGBA1C 6.0*   Sepsis Labs: '@LABRCNTIP'$ (procalcitonin:4,lacticidven:4) ) Recent Results (from the past 240 hour(s))  MRSA Next Gen by PCR, Nasal     Status: None   Collection Time: 07/04/21  3:30 PM   Specimen: Nasal Mucosa; Nasal Swab  Result Value Ref Range Status   MRSA by PCR Next Gen NOT DETECTED NOT DETECTED Final    Comment: (NOTE) The GeneXpert MRSA Assay (FDA approved for NASAL specimens only), is one component of a comprehensive MRSA colonization surveillance program. It is not intended to diagnose MRSA infection nor to guide or monitor treatment for MRSA infections. Test performance is  not FDA approved in patients less than 68 years old. Performed at [facility], [address], [place], [place] [address]       Radiology Studies: [facility] Chest [facility] 1 View  Result Date: 07/06/2021 CLINICAL DATA:  Shortness of breath. EXAM: PORTABLE CHEST 1 VIEW COMPARISON:  Chest radiograph 07/04/2021 FINDINGS: The cardiac silhouette remains mildly enlarged. Aortic atherosclerosis is noted. Pulmonary vascular congestion and diffuse bilateral interstitial opacities have decreased. Confluent airspace opacity persists in the left lower lobe. No sizable pleural effusion or pneumothorax is identified. IMPRESSION: 1. Overall improved appearance of the lungs with decreased edema. 2. Persistent left lower lobe consolidation which may reflect pneumonia. Electronically Signed   By: Logan Bores M.D.   On: 07/06/2021 09:38     Scheduled Meds:  aspirin EC  81 mg Oral Q breakfast   Chlorhexidine Gluconate Cloth  6 each Topical Daily   clopidogrel  75 mg Oral Daily   donepezil  10 mg Oral QHS   enoxaparin (LOVENOX) injection  30 mg Subcutaneous Q24H   ferrous sulfate  325 mg Oral Q breakfast   furosemide  60 mg Intravenous BID   insulin aspart  0-15 Units Subcutaneous TID WC   insulin aspart  0-5 Units Subcutaneous QHS   insulin detemir  12 Units Subcutaneous QHS   metoprolol succinate  50 mg Oral Q breakfast   multivitamin with minerals  1 tablet Oral Daily   pantoprazole  40 mg Oral Daily   sodium chloride flush  3 mL Intravenous Q12H   Continuous Infusions:  sodium chloride     azithromycin     cefTRIAXone (ROCEPHIN)  IV     nitroGLYCERIN Stopped (07/05/21 0737)     LOS: 2 days    Roxan Hockey M.D on 07/06/2021 at 6:21 PM  Go to [email or web address] - for contact info  [facility] - Office  [phone]  If 7PM-7AM, please contact night-coverage [email or web address] 07/06/2021, 6:21 PM

## 2021-07-06 NOTE — Care Management Important Message (Signed)
Important Message  Patient Details  Name: Allen Yu MRN: [id number] Date of Birth: 10-Jun-1945   Medicare Important Message Given:  N/A - LOS <3 / Initial given by admissions     [name] 07/06/2021, 11:27 AM

## 2021-07-07 LAB — BASIC METABOLIC PANEL
Anion gap: 10 (ref 5–15)
BUN: 42 mg/dL — ABNORMAL HIGH (ref 8–23)
CO2: 28 mmol/L (ref 22–32)
Calcium: 8.8 mg/dL — ABNORMAL LOW (ref 8.9–10.3)
Chloride: 94 mmol/L — ABNORMAL LOW (ref 98–111)
Creatinine, Ser: 1.86 mg/dL — ABNORMAL HIGH (ref 0.61–1.24)
GFR, Estimated: 37 mL/min — ABNORMAL LOW (ref >60)
Glucose, Bld: 115 mg/dL — ABNORMAL HIGH (ref 70–99)
Potassium: 4.2 mmol/L (ref 3.5–5.1)
Sodium: 132 mmol/L — ABNORMAL LOW (ref 135–145)

## 2021-07-07 LAB — CBC
HCT: 24.4 % — ABNORMAL LOW (ref 39.0–52.0)
Hemoglobin: 7.8 g/dL — ABNORMAL LOW (ref 13.0–17.0)
MCH: 31.7 pg (ref 26.0–34.0)
MCHC: 32 g/dL (ref 30.0–36.0)
MCV: 99.2 fL (ref 80.0–100.0)
Platelets: 331 10*3/uL (ref 150–400)
RBC: 2.46 MIL/uL — ABNORMAL LOW (ref 4.22–5.81)
RDW: 16.2 % — ABNORMAL HIGH (ref 11.5–15.5)
WBC: 7 10*3/uL (ref 4.0–10.5)
nRBC: 0 % (ref 0.0–0.2)

## 2021-07-07 LAB — GLUCOSE, CAPILLARY: Glucose-Capillary: 57 mg/dL — ABNORMAL LOW (ref 70–99)

## 2021-07-07 MED ORDER — AZITHROMYCIN 500 MG PO TABS: 500.0000 mg | ORAL_TABLET | Freq: Every day | ORAL | 0 refills | Status: AC

## 2021-07-07 MED ORDER — TORSEMIDE 40 MG PO TABS: 40.0000 mg | ORAL_TABLET | Freq: Every morning | ORAL | 3 refills | Status: DC

## 2021-07-07 MED ORDER — CEFDINIR 300 MG PO CAPS: 300.0000 mg | ORAL_CAPSULE | Freq: Two times a day (BID) | ORAL | 0 refills | Status: AC

## 2021-07-07 MED ORDER — LANTUS 100 UNIT/ML ~~LOC~~ SOLN
8.0000 [IU] | Freq: Every day | SUBCUTANEOUS | 11 refills | Status: DC
Start: 2021-07-07 — End: 2021-09-15

## 2021-07-07 MED ORDER — LORAZEPAM 0.5 MG PO TABS
0.5000 mg | ORAL_TABLET | Freq: Once | ORAL | Status: AC
Start: 2021-07-07 — End: 2021-07-07
  Administered 2021-07-07: 0.5 mg via ORAL
  Filled 2021-07-07: qty 1

## 2021-07-07 MED ORDER — CLOPIDOGREL BISULFATE 75 MG PO TABS: 75.0000 mg | ORAL_TABLET | Freq: Every day | ORAL | 3 refills | Status: AC

## 2021-07-07 MED ORDER — ASPIRIN EC 81 MG PO TBEC: 81.0000 mg | DELAYED_RELEASE_TABLET | Freq: Every day | ORAL | 2 refills | Status: DC

## 2021-07-07 MED ORDER — GABAPENTIN 100 MG PO CAPS: 100.0000 mg | ORAL_CAPSULE | Freq: Two times a day (BID) | ORAL | 3 refills | Status: DC

## 2021-07-07 MED ORDER — LOSARTAN POTASSIUM 50 MG PO TABS: 50.0000 mg | ORAL_TABLET | Freq: Every day | ORAL | 3 refills | Status: DC

## 2021-07-07 NOTE — Discharge Instructions (Signed)
1)Urinary retention with chronic indwelling Foley catheter ---Needs Foley catheter changed monthly 2)Low-salt/low-sodium diet advised 3)CBC and BMP recheck with PCP in about a week or so 4)Please note that there has been some changes to your medications

## 2021-07-07 NOTE — Plan of Care (Signed)

## 2021-07-07 NOTE — Discharge Summary (Incomplete)
Allen Yu, is a 76 y.o. male  DOB 08/30/1945  MRN [id number].  Admission date:  07/04/2021  Admitting Physician  Barton Dubois, MD  Discharge Date:  07/07/2021   Primary MD  [name], MD  Recommendations for primary care physician for things to follow:  1)Urinary retention with chronic indwelling Foley catheter ---Needs Foley catheter changed monthly 2)Low-salt/low-sodium diet advised 3)CBC and BMP recheck with PCP in about a week or so 4)Please note that there has been some changes to your medications  Admission Diagnosis  Acute on chronic systolic CHF (congestive heart failure) ([place]) [I50.23]   Discharge Diagnosis  Acute on chronic systolic CHF (congestive heart failure) (HCC) [I50.23]  ***  Principal Problem:   Acute on chronic systolic CHF (congestive heart failure) ([place]) Active Problems:   Acute kidney injury superimposed on chronic kidney disease ([place])   Anemia   Schizophrenia, unspecified (HCC)   Unspecified atrial fibrillation (HCC)   GERD (gastroesophageal reflux disease)   Type 2 diabetes mellitus with other circulatory complications (HCC)   S/P AKA (above knee amputation) unilateral, left ([place])   Hyperlipemia      Past Medical History:  Diagnosis Date   Anemia    Arthritis    Carotid stenosis    Chronic back pain    Chronic HFrEF (heart failure with reduced ejection fraction) ([place])    a. 09/2020 Echo: EF 45%, mild LVH; b. 12/2020 Echo: EF 25-30%, glob HK, mod LVH, mildly reduced RV fxn, RVSP 65.13mHg, mod BAE. Triv effusion. Mod MR/TR.   CKD (chronic kidney disease), stage III ([place]    Constipation    Dementia ([place]    Diabetes mellitus    Dilated cardiomyopathy ([place]    a. 09/2020 Echo: EF 45%; b. 12/2020 Echo: EF 25-30%.   Foot drop, right    GERD (gastroesophageal reflux disease)    History of kidney stones    Hypertension    Lung nodule    PAF (paroxysmal atrial  fibrillation) (HCC)    Peripheral neuropathy    Peripheral vascular disease ([place]    a. 06/2020 s/p L AKA; b. 10/2020 s/p R SFA and above-knee popliteal PTA.   PTSD (post-traumatic stress disorder)    Schizophrenia ([place]    Tuberculosis    Treated    Past Surgical History:  Procedure Laterality Date   ABDOMINAL AORTOGRAM W/LOWER EXTREMITY N/A 05/20/2020   Procedure: ABDOMINAL AORTOGRAM W/LOWER EXTREMITY;  Surgeon: FElam Dutch MD;  [facility];  Service: Cardiovascular;  Laterality: N/A;   ABDOMINAL AORTOGRAM W/LOWER EXTREMITY N/A 10/27/2020   Procedure: ABDOMINAL AORTOGRAM W/LOWER EXTREMITY;  Surgeon: CMarty Heck MD;  [facility];  Service: Cardiovascular;  Laterality: N/A;   AMPUTATION Left 06/28/2020   Procedure: AMPUTATION ABOVE KNEE LEFT;  Surgeon: ERosetta Posner MD;  [facility];  Service: Vascular;  Laterality: Left;   [place] 2013   x2   BLADDER SURGERY     02  CATARACT EXTRACTION W/PHACO Right 05/12/2021   Procedure: CATARACT EXTRACTION PHACO AND INTRAOCULAR LENS PLACEMENT (IOC);  Surgeon: Baruch Goldmann, MD;  Location: [facility];  Service: Ophthalmology;  Laterality: Right;  CDE: 27.28   CATARACT EXTRACTION W/PHACO Left 05/29/2021   Procedure: CATARACT EXTRACTION PHACO AND INTRAOCULAR LENS PLACEMENT (IOC);  Surgeon: Baruch Goldmann, MD;  Location: [facility];  Service: Ophthalmology;  Laterality: Left;  CDE  12.96   CHOLECYSTECTOMY     COLONOSCOPY     COLONOSCOPY N/A 12/07/2014   Procedure: COLONOSCOPY;  Surgeon: Daneil Dolin, MD;  [facility];  Service: Endoscopy;  Laterality: N/A;  10:30 Am   EYE SURGERY Bilateral    removed metal from eye   FEMORAL-TIBIAL BYPASS GRAFT Left 05/27/2020   Procedure: LEFT FEMORAL TO PERONEAL ARTERY BYPASS;  Surgeon: Rosetta Posner, MD;  Location: [facility];  Service: Vascular;  Laterality: Left;   HERNIA REPAIR Right    INGUINAL HERNIA REPAIR Left 11/03/2013    Procedure: HERNIA REPAIR INGUINAL ADULT;  Surgeon: Gayland Curry, MD;  Location: [facility];  Service: General;  Laterality: Left;   PERIPHERAL VASCULAR INTERVENTION  10/27/2020   Procedure: PERIPHERAL VASCULAR INTERVENTION;  Surgeon: Marty Heck, MD;  Location: [facility];  Service: Cardiovascular;;   SHOULDER SURGERY     RIGHT SHOULDER    VIDEO BRONCHOSCOPY WITH ENDOBRONCHIAL NAVIGATION N/A 07/10/2019   Procedure: VIDEO BRONCHOSCOPY WITH ENDOBRONCHIAL NAVIGATION;  Surgeon: Melrose Nakayama, MD;  Location: [facility];  Service: Thoracic;  Laterality: N/A;       HPI  from the history and physical done on the day of admission:     ***  ****     Hospital Course:     No notes on file  ***** Assessment and Plan: * Acute on chronic systolic CHF (congestive heart failure) ([place]) - Last ejection fraction 25-30% -Appears to be associated with medication noncompliance and diet indiscretion -On presentation with significant hypertensive urgency and the need for BiPAP and nitroglycerin drip. -After medications and initiation of IV Lasix therapy blood pressure stabilizes and patient breathing improved. -Over 6 months since last echo; will update. -Follow daily weights, strict I's and O's, low-sodium diet, Reds CLipp protocol and clinical response. -Started on IV Lasix. -Continue Toprol -Holding ARB in the setting of acute exacerbation and slightly worsening in his creatinine.  Schizophrenia, unspecified ([place]) - Continue the use of Risperdal -Following commands appropriately -No hallucinations.  Anemia - In the setting of chronic renal failure -No overt bleeding appreciated -No transfusion needed -Continue following hemoglobin trend.  Acute kidney injury superimposed on chronic kidney disease ([place]) - Patient with chronic kidney disease a stage IIIb at baseline -Very likely mild acute kidney injury in the setting of decreased perfusion with CHF exacerbation -Holding  Cozaar initially -Provide Lasix for volume stabilization -Control blood pressure and follow renal function trend.  Unspecified atrial fibrillation (HCC) - Sinus rhythm currently -Continue the use of Toprol -Patient using aspirin and Plavix.  Hyperlipemia - Continue statins.  S/P AKA (above knee amputation) unilateral, left (HCC) - Appears to be stable; stump without any open wounds. -Continue treatment with aspirin/Plavix and good control of diabetes.  Type 2 diabetes mellitus with other circulatory complications (HCC) - Status post left AKA -Update A1c -Hold oral hypoglycemic agents while inpatient -Continue sliding scale insulin. -Follow CBGs and adjust regimen as needed.  GERD (gastroesophageal reflux disease) - Continue PPI.        Discharge Condition: ***  Follow  UP     Consults obtained - ***  Diet and Activity recommendation:  As advised  Discharge Instructions    **** Discharge Instructions     (HEART FAILURE PATIENTS) Call MD:  Anytime you have any of the following symptoms: 1) 3 pound weight gain in 24 hours or 5 pounds in 1 week 2) shortness of breath, with or without a dry hacking cough 3) swelling in the hands, feet or stomach 4) if you have to sleep on extra pillows at night in order to breathe.   Complete by: As directed    Call MD for:  difficulty breathing, headache or visual disturbances   Complete by: As directed    Call MD for:  persistant dizziness or light-headedness   Complete by: As directed    Call MD for:  persistant nausea and vomiting   Complete by: As directed    Call MD for:  temperature >100.4   Complete by: As directed    Diet - low sodium heart healthy   Complete by: As directed    Discharge instructions   Complete by: As directed    1)Urinary retention with chronic indwelling Foley catheter ---Needs Foley catheter changed monthly 2)Low-salt/low-sodium diet advised 3)CBC and BMP recheck with PCP in about a week or  so 4)Please note that there has been some changes to your medications   Increase activity slowly   Complete by: As directed    No wound care   Complete by: As directed          Discharge Medications     Allergies as of 07/07/2021       Reactions   Bee Venom Anaphylaxis   Codeine Other (See Comments)   incoherent  Other reaction(s): Delirium   Propoxyphene Other (See Comments)   Dizziness, "Makes me feel drunk" Other reaction(s): Dizziness   Valsartan Other (See Comments)   incoherent Other reaction(s): Delirium        Medication List     TAKE these medications    acetaminophen 500 MG tablet Commonly known as: TYLENOL Take 500 mg by mouth 2 (two) times daily as needed for moderate pain.   Alcohol Pads 70 % Pads USE 1 PAD AS DIRECTED   aspirin EC 81 MG tablet Take 1 tablet (81 mg total) by mouth daily with breakfast.   azithromycin 500 MG tablet Commonly known as: ZITHROMAX Take 1 tablet (500 mg total) by mouth daily for 3 days.   B-D UF III MINI PEN NEEDLES 31G X 5 MM Misc Generic drug: Insulin Pen Needle SMARTSIG:1 Each SUB-Q Daily   cefdinir 300 MG capsule Commonly known as: OMNICEF Take 1 capsule (300 mg total) by mouth 2 (two) times daily for 5 days.   clopidogrel 75 MG tablet Commonly known as: Plavix Take 1 tablet (75 mg total) by mouth daily.   donepezil 10 MG tablet Commonly known as: ARICEPT Take 10 mg by mouth at bedtime.   feeding supplement (ENSURE COMPLETE) Liqd Take 237 mLs by mouth 2 (two) times daily between meals.   ferrous sulfate 325 (65 FE) MG tablet Take 325 mg by mouth daily with breakfast.   gabapentin 100 MG capsule Commonly known as: NEURONTIN Take 1 capsule (100 mg total) by mouth 2 (two) times daily. What changed:  when to take this reasons to take this   Lantus 100 UNIT/ML injection Generic drug: insulin glargine Inject 0.08 mLs (8 Units total) into the skin at bedtime. What changed:  how much to  take when  to take this reasons to take this additional instructions   losartan 50 MG tablet Commonly known as: COZAAR Take 1 tablet (50 mg total) by mouth daily. What changed:  medication strength how much to take   metoprolol succinate 50 MG 24 hr tablet Commonly known as: TOPROL-XL Take 1 tablet (50 mg total) by mouth daily. Take with or immediately following a meal.   multivitamin with minerals tablet Take 1 tablet by mouth daily.   omeprazole 20 MG capsule Commonly known as: PRILOSEC Take 1 capsule by mouth daily.   ondansetron 4 MG tablet Commonly known as: ZOFRAN Take 1 tablet (4 mg total) by mouth every 6 (six) hours as needed for nausea.   risperiDONE 0.5 MG tablet Commonly known as: RISPERDAL Take 1 tablet (0.5 mg total) by mouth every 12 (twelve) hours as needed (Restlessness and agitation).   Torsemide 40 MG Tabs Take 40 mg by mouth every morning. What changed:  medication strength how much to take when to take this   traMADol 50 MG tablet Commonly known as: ULTRAM Take 50 mg by mouth every 6 (six) hours as needed for moderate pain.        Major procedures and Radiology Reports - PLEASE review detailed and final reports for all details, in brief -   ***  DG Chest Port 1 View  Result Date: 07/06/2021 CLINICAL DATA:  Shortness of breath. EXAM: PORTABLE CHEST 1 VIEW COMPARISON:  Chest radiograph 07/04/2021 FINDINGS: The cardiac silhouette remains mildly enlarged. Aortic atherosclerosis is noted. Pulmonary vascular congestion and diffuse bilateral interstitial opacities have decreased. Confluent airspace opacity persists in the left lower lobe. No sizable pleural effusion or pneumothorax is identified. IMPRESSION: 1. Overall improved appearance of the lungs with decreased edema. 2. Persistent left lower lobe consolidation which may reflect pneumonia. Electronically Signed   By: Logan Bores M.D.   On: 07/06/2021 09:38   ECHOCARDIOGRAM COMPLETE  Result Date:  07/04/2021    ECHOCARDIOGRAM REPORT   Patient Name:   Allen Yu Date of Exam: 07/04/2021 Medical Rec #:  [id number]      Height:       73.0 in Accession #:    [id number]     Weight:       118.0 lb Date of Birth:  October 28, 1945     BSA:          1.719 m Patient Age:    76 years       BP:           157/81 mmHg Patient Gender: M              HR:           83 bpm. Exam Location:  [facility] Procedure: 2D Echo, Cardiac Doppler and Color Doppler Indications:    CHF  History:        Patient has prior history of Echocardiogram examinations, most                 recent 01/14/2021. CHF, COPD, Arrythmias:Atrial Fibrillation,                 Signs/Symptoms:Bacteremia; Risk Factors:Hypertension, Diabetes                 and Current Smoker. PTSD, Dementia, Schizophrenia.  Sonographer:    Wenda Low Referring Phys: [place]  1. Left ventricular ejection fraction, by estimation, is 35 to 40%. The left ventricle has moderately decreased function. The  left ventricle demonstrates global hypokinesis. Left ventricular diastolic parameters are consistent with Grade I diastolic dysfunction (impaired relaxation).  2. Right ventricular systolic function is normal. The right ventricular size is normal. There is moderately elevated pulmonary artery systolic pressure. The estimated right ventricular systolic pressure is 89.2 mmHg.  3. The mitral valve is grossly normal. Trivial mitral valve regurgitation. No evidence of mitral stenosis.  4. The aortic valve is tricuspid. There is mild calcification of the aortic valve. Aortic valve regurgitation is not visualized. Aortic valve sclerosis is present, with no evidence of aortic valve stenosis.  5. The inferior vena cava is dilated in size with >50% respiratory variability, suggesting right atrial pressure of 8 mmHg. Comparison(s): Changes from prior study are noted. EF has improved ~35-40%. FINDINGS  Left Ventricle: Left ventricular ejection fraction, by estimation,  is 35 to 40%. The left ventricle has moderately decreased function. The left ventricle demonstrates global hypokinesis. The left ventricular internal cavity size was normal in size. There is no left ventricular hypertrophy. Left ventricular diastolic parameters are consistent with Grade I diastolic dysfunction (impaired relaxation). Right Ventricle: The right ventricular size is normal. No increase in right ventricular wall thickness. Right ventricular systolic function is normal. There is moderately elevated pulmonary artery systolic pressure. The tricuspid regurgitant velocity is 3.24 m/s, and with an assumed right atrial pressure of 8 mmHg, the estimated right ventricular systolic pressure is 11.9 mmHg. Left Atrium: Left atrial size was normal in size. Right Atrium: Right atrial size was normal in size. Pericardium: There is no evidence of pericardial effusion. Mitral Valve: The mitral valve is grossly normal. Trivial mitral valve regurgitation. No evidence of mitral valve stenosis. MV peak gradient, 3.4 mmHg. The mean mitral valve gradient is 2.0 mmHg. Tricuspid Valve: The tricuspid valve is grossly normal. Tricuspid valve regurgitation is trivial. No evidence of tricuspid stenosis. Aortic Valve: The aortic valve is tricuspid. There is mild calcification of the aortic valve. Aortic valve regurgitation is not visualized. Aortic valve sclerosis is present, with no evidence of aortic valve stenosis. Aortic valve mean gradient measures 2.0 mmHg. Aortic valve peak gradient measures 3.9 mmHg. Aortic valve area, by VTI measures 2.74 cm. Pulmonic Valve: The pulmonic valve was grossly normal. Pulmonic valve regurgitation is not visualized. No evidence of pulmonic stenosis. Aorta: The aortic root is normal in size and structure. Venous: The right lower pulmonary vein is normal. The inferior vena cava is dilated in size with greater than 50% respiratory variability, suggesting right atrial pressure of 8 mmHg. IAS/Shunts:  The atrial septum is grossly normal. Additional Comments: There is a small pleural effusion in the left lateral region.  LEFT VENTRICLE PLAX 2D LVIDd:         4.90 cm      Diastology LVIDs:         4.20 cm      LV e' medial:    6.42 cm/s LV PW:         1.10 cm      LV E/e' medial:  10.9 LV IVS:        1.20 cm      LV e' lateral:   5.55 cm/s LVOT diam:     2.10 cm      LV E/e' lateral: 12.6 LV SV:         55 LV SV Index:   32 LVOT Area:     3.46 cm  LV Volumes (MOD) LV vol d, MOD A2C: 155.0 ml LV vol d, MOD  A4C: 125.0 ml LV vol s, MOD A2C: 94.4 ml LV vol s, MOD A4C: 75.8 ml LV SV MOD A2C:     60.6 ml LV SV MOD A4C:     125.0 ml LV SV MOD BP:      54.2 ml RIGHT VENTRICLE RV Basal diam:  3.30 cm RV Mid diam:    2.40 cm RV S prime:     12.20 cm/s TAPSE (M-mode): 2.2 cm LEFT ATRIUM             Index        RIGHT ATRIUM           Index LA diam:        3.70 cm 2.15 cm/m   RA Area:     17.60 cm LA Vol (A2C):   55.3 ml 32.16 ml/m  RA Volume:   51.30 ml  29.84 ml/m LA Vol (A4C):   47.2 ml 27.45 ml/m LA Biplane Vol: 53.0 ml 30.82 ml/m  AORTIC VALVE                    PULMONIC VALVE AV Area (Vmax):    2.66 cm     PV Vmax:       0.80 m/s AV Area (Vmean):   2.31 cm     PV Peak grad:  2.5 mmHg AV Area (VTI):     2.74 cm AV Vmax:           98.40 cm/s AV Vmean:          62.300 cm/s AV VTI:            0.202 m AV Peak Grad:      3.9 mmHg AV Mean Grad:      2.0 mmHg LVOT Vmax:         75.70 cm/s LVOT Vmean:        41.500 cm/s LVOT VTI:          0.160 m LVOT/AV VTI ratio: 0.79  AORTA Ao Root diam: 3.50 cm MITRAL VALVE               TRICUSPID VALVE MV Area (PHT): 4.31 cm    TR Peak grad:   42.0 mmHg MV Area VTI:   2.77 cm    TR Vmax:        324.00 cm/s MV Peak grad:  3.4 mmHg MV Mean grad:  2.0 mmHg    SHUNTS MV Vmax:       0.92 m/s    Systemic VTI:  0.16 m MV Vmean:      57.3 cm/s   Systemic Diam: 2.10 cm MV Decel Time: 176 msec MV E velocity: 69.80 cm/s MV A velocity: 83.10 cm/s MV E/A ratio:  0.84 Eleonore Chiquito MD  Electronically signed by Eleonore Chiquito MD Signature Date/Time: 07/04/2021/4:47:17 PM    Final    DG Chest Portable 1 View  Result Date: 07/04/2021 CLINICAL DATA:  Respiratory distress EXAM: PORTABLE CHEST 1 VIEW COMPARISON:  Chest radiograph 05/16/2021 FINDINGS: Cardiomegaly is unchanged. The upper mediastinal contours are within normal limits. There is vascular congestion and suspected mild pulmonary interstitial edema. There is more confluent opacity projecting over the left lower lobe with a probable small left pleural effusion, new since 05/16/2021. There is no other focal airspace disease. There is no significant right effusion. There is no pneumothorax. There is no acute osseous abnormality. IMPRESSION: 1. Patchy opacities in the left base and probable small left pleural effusion which  may reflect pneumonia in the correct clinical setting, new since 05/16/2021. Recommend follow-up radiographs in 6-8 weeks to assess for resolution. 2. Vascular congestion and suspected mild pulmonary interstitial edema. Electronically Signed   By: Valetta Mole M.D.   On: 07/04/2021 11:19    Micro Results   *** Recent Results (from the past 240 hour(s))  MRSA Next Gen by PCR, Nasal     Status: None   Collection Time: 07/04/21  3:30 PM   Specimen: Nasal Mucosa; Nasal Swab  Result Value Ref Range Status   MRSA by PCR Next Gen NOT DETECTED NOT DETECTED Final    Comment: (NOTE) The GeneXpert MRSA Assay (FDA approved for NASAL specimens only), is one component of a comprehensive MRSA colonization surveillance program. It is not intended to diagnose MRSA infection nor to guide or monitor treatment for MRSA infections. Test performance is not FDA approved in patients less than 88 years old. Performed at [facility], [address], [place], [place] [address]     Today   Subjective    Allen Yu today has no ***          Patient has been seen and examined prior to discharge   Objective   Blood  pressure 122/84, pulse 84, temperature 98.6 F (37 C), resp. rate 18, weight 50 kg, SpO2 98 %.   Intake/Output Summary (Last 24 hours) at 07/07/2021 1045 Last data filed at 07/07/2021 0900 Gross per 24 hour  Intake 1854.38 ml  Output 3150 ml  Net -1295.62 ml    Exam Gen:- Awake Alert, no acute distress *** HEENT:- [place].AT, No sclera icterus Neck-Supple Neck,No JVD,.  Lungs-  CTAB , good air movement bilaterally CV- S1, S2 normal, regular Abd-  +ve B.Sounds, Abd Soft, No tenderness,    Extremity/Skin:- No  edema,   good pulses Psych-affect is appropriate, oriented x3 Neuro-no new focal deficits, no tremors ***   Data Review   CBC w Diff:  Lab Results  Component Value Date   WBC 7.0 07/07/2021   HGB 7.8 (L) 07/07/2021   HCT 24.4 (L) 07/07/2021   HCT 31.3 (L) 07/09/2015   PLT 331 07/07/2021   LYMPHOPCT 30 07/04/2021   BANDSPCT 12 04/10/2021   MONOPCT 7 07/04/2021   EOSPCT 2 07/04/2021   BASOPCT 1 07/04/2021    CMP:  Lab Results  Component Value Date   NA 132 (L) 07/07/2021   K 4.2 07/07/2021   CL 94 (L) 07/07/2021   CO2 28 07/07/2021   BUN 42 (H) 07/07/2021   CREATININE 1.86 (H) 07/07/2021   CREATININE 1.10 11/26/2019   PROT 8.3 (H) 07/04/2021   ALBUMIN 3.0 (L) 07/06/2021   BILITOT 0.9 07/04/2021   ALKPHOS 126 07/04/2021   AST 23 07/04/2021   ALT 18 07/04/2021  .  Total Discharge time is about 33 minutes  Roxan Hockey M.D on 07/07/2021 at 10:45 AM  Go to [email or web address] -  for contact info  [facility] - Office  [phone]

## 2021-07-07 NOTE — Progress Notes (Incomplete)
0030: Patient pulled out IV. This RN attempted to reinsert access and patient became aggressive and threatening RN saying we are not putting another IV in. Patient acting as if he will hit RN. Notified on-call provider.   Patient refusing vitals. Asked lab to reschedule morning labs to later in the morning due to agitation.

## 2021-07-07 NOTE — Progress Notes (Signed)
Per night shift report, patient became aggressive, attempted to turn over IV pole onto staff, tossed his foley bag toward staff. Patient was refusing to be stuck for IV. The night shift hospitalist was notified per report. Patient is still without IV and requiring a lot of coaching. Blood glucose this am 57, provided patient a soda as he is alert and oriented. Will recheck.

## 2021-07-07 NOTE — Inpatient Diabetes Management (Signed)
Inpatient Diabetes Program Recommendations  AACE/ADA: New Consensus Statement on Inpatient Glycemic Control   Target Ranges:  Prepandial:   less than 140 mg/dL      Peak postprandial:   less than 180 mg/dL (1-2 hours)      Critically ill patients:  140 - 180 mg/dL    Latest Reference Range & Units 07/06/21 07:35 07/06/21 11:15 07/06/21 16:30 07/06/21 20:08 07/07/21 07:24  Glucose-Capillary 70 - 99 mg/dL 90 208 (H) 162 (H) 175 (H) 57 (L)   Review of Glycemic Control  Diabetes history: DM2 Outpatient Diabetes medications: Lantus 6 units QHS if CBG over 200 mg/dl Current orders for Inpatient glycemic control: Levemir 12 units QHS, Novolog 0-15 units TID with meals, Novolog 0-5 units QHS   Inpatient Diabetes Program Recommendations:     Insulin: Fasting CBG 57 mg/dl this morning. Please consider decreasing Levemir to 9 units QHS.   Thanks, Barnie Alderman, RN, MSN, [place] Diabetes Coordinator Inpatient Diabetes Program [phone] (Team Pager from 8am to [place])

## 2021-07-07 NOTE — Progress Notes (Signed)
Linda with [facility] accepted for [facility]. Patient already discharged home. Wife agreeable, orders have been placed.

## 2021-07-11 DIAGNOSIS — I739 Peripheral vascular disease, unspecified: Secondary | ICD-10-CM | POA: Diagnosis not present

## 2021-07-11 DIAGNOSIS — I5023 Acute on chronic systolic (congestive) heart failure: Secondary | ICD-10-CM | POA: Diagnosis not present

## 2021-07-11 DIAGNOSIS — J181 Lobar pneumonia, unspecified organism: Secondary | ICD-10-CM | POA: Diagnosis not present

## 2021-07-13 NOTE — Progress Notes (Incomplete)
Cardiology Office Note    Date:  07/13/2021   ID:  Allen Yu, DOB [birth year], MRN [id number]  PCP:  [name], MD  Cardiologist: Rozann Lesches, MD    No chief complaint on file.   History of Present Illness:    Allen Yu is a 76 y.o. male with past medical history of PAD (s/p L AKA in 06/2020), persistent atrial fibrillation (diagnosed in 07/2020 and converted to NSR with IV Amiodarone, recurrence in 09/2020), HFrEF (EF 45% in 09/2020, at 25-30% in 12/2020), HTN, Type 2 DM, PTSD, Schizophrenia and dementia who presents to the office today for hospital follow-up.       Past Medical History:  Diagnosis Date   Anemia    Arthritis    Carotid stenosis    Chronic back pain    Chronic HFrEF (heart failure with reduced ejection fraction) ([place])    a. 09/2020 Echo: EF 45%, mild LVH; b. 12/2020 Echo: EF 25-30%, glob HK, mod LVH, mildly reduced RV fxn, RVSP 65.41mHg, mod BAE. Triv effusion. Mod MR/TR.   CKD (chronic kidney disease), stage III ([place]    Constipation    Dementia ([place]    Diabetes mellitus    Dilated cardiomyopathy ([place]    a. 09/2020 Echo: EF 45%; b. 12/2020 Echo: EF 25-30%.   Foot drop, right    GERD (gastroesophageal reflux disease)    History of kidney stones    Hypertension    Lung nodule    PAF (paroxysmal atrial fibrillation) (HCC)    Peripheral neuropathy    Peripheral vascular disease ([facility]    a. 06/2020 s/p L AKA; b. 10/2020 s/p R SFA and above-knee popliteal PTA.   PTSD (post-traumatic stress disorder)    Schizophrenia ([place]    Tuberculosis    Treated    Past Surgical History:  Procedure Laterality Date   ABDOMINAL AORTOGRAM W/LOWER EXTREMITY N/A 05/20/2020   Procedure: ABDOMINAL AORTOGRAM W/LOWER EXTREMITY;  Surgeon: FElam Dutch MD;  [facility];  Service: Cardiovascular;  Laterality: N/A;   ABDOMINAL AORTOGRAM W/LOWER EXTREMITY N/A 10/27/2020   Procedure: ABDOMINAL AORTOGRAM W/LOWER EXTREMITY;  Surgeon: CMarty Heck MD;  [facility];  Service: Cardiovascular;  Laterality: N/A;   AMPUTATION Left 06/28/2020   Procedure: AMPUTATION ABOVE KNEE LEFT;  Surgeon: ERosetta Posner MD;  [facility]  Service: Vascular;  Laterality: Left;   ACarm[place] 2000, 2013   x2   BLADDER SURGERY     02   CATARACT EXTRACTION W/PHACO Right 05/12/2021   Procedure: CATARACT EXTRACTION PHACO AND INTRAOCULAR LENS PLACEMENT ([place];  Surgeon: WBaruch Goldmann MD;  [facility];  Service: Ophthalmology;  Laterality: Right;  CDE: 27.28   CATARACT EXTRACTION W/PHACO Left 05/29/2021   Procedure: CATARACT EXTRACTION PHACO AND INTRAOCULAR LENS PLACEMENT ([facility]);  Surgeon: WBaruch Goldmann MD;  Location: [facility];  Service: Ophthalmology;  Laterality: Left;  CDE  12.96   CHOLECYSTECTOMY     COLONOSCOPY     COLONOSCOPY N/A 12/07/2014   Procedure: COLONOSCOPY;  Surgeon: RDaneil Dolin MD;  [facility];  Service: Endoscopy;  Laterality: N/A;  10:30 Am   EYE SURGERY Bilateral    removed metal from eye   FEMORAL-TIBIAL BYPASS GRAFT Left 05/27/2020   Procedure: LEFT FEMORAL TO PERONEAL ARTERY BYPASS;  Surgeon: ERosetta Posner MD;  [place] [facility]  Service: Vascular;  Laterality: Left;   HERNIA REPAIR Right  INGUINAL HERNIA REPAIR Left 11/03/2013   Procedure: HERNIA REPAIR INGUINAL ADULT;  Surgeon: Gayland Curry, MD;  Location: [facility];  Service: General;  Laterality: Left;   PERIPHERAL VASCULAR INTERVENTION  10/27/2020   Procedure: PERIPHERAL VASCULAR INTERVENTION;  Surgeon: Marty Heck, MD;  Location: [facility];  Service: Cardiovascular;;   SHOULDER SURGERY     RIGHT SHOULDER    VIDEO BRONCHOSCOPY WITH ENDOBRONCHIAL NAVIGATION N/A 07/10/2019   Procedure: VIDEO BRONCHOSCOPY WITH ENDOBRONCHIAL NAVIGATION;  Surgeon: Melrose Nakayama, MD;  Location: [facility];  Service: Thoracic;  Laterality: N/A;    Current Medications: Outpatient Medications Prior to Visit   Medication Sig Dispense Refill   acetaminophen (TYLENOL) 500 MG tablet Take 500 mg by mouth 2 (two) times daily as needed for moderate pain.     Alcohol Swabs (ALCOHOL PADS) 70 % PADS USE 1 PAD AS DIRECTED     aspirin EC 81 MG tablet Take 1 tablet (81 mg total) by mouth daily with breakfast. 90 tablet 2   B-D UF III MINI PEN NEEDLES 31G X 5 MM MISC SMARTSIG:1 Each SUB-Q Daily     clopidogrel (PLAVIX) 75 MG tablet Take 1 tablet (75 mg total) by mouth daily. 90 tablet 3   donepezil (ARICEPT) 10 MG tablet Take 10 mg by mouth at bedtime.     feeding supplement, ENSURE COMPLETE, (ENSURE COMPLETE) LIQD Take 237 mLs by mouth 2 (two) times daily between meals. 23700 mL 1   ferrous sulfate 325 (65 FE) MG tablet Take 325 mg by mouth daily with breakfast.     gabapentin (NEURONTIN) 100 MG capsule Take 1 capsule (100 mg total) by mouth 2 (two) times daily. 60 capsule 3   LANTUS 100 UNIT/ML injection Inject 0.08 mLs (8 Units total) into the skin at bedtime. 10 mL 11   losartan (COZAAR) 50 MG tablet Take 1 tablet (50 mg total) by mouth daily. 90 tablet 3   metoprolol succinate (TOPROL-XL) 50 MG 24 hr tablet Take 1 tablet (50 mg total) by mouth daily. Take with or immediately following a meal. 90 tablet 3   Multiple Vitamins-Minerals (MULTIVITAMIN WITH MINERALS) tablet Take 1 tablet by mouth daily. 120 tablet 2   omeprazole (PRILOSEC) 20 MG capsule Take 1 capsule by mouth daily.     ondansetron (ZOFRAN) 4 MG tablet Take 1 tablet (4 mg total) by mouth every 6 (six) hours as needed for nausea. 20 tablet 0   risperiDONE (RISPERDAL) 0.5 MG tablet Take 1 tablet (0.5 mg total) by mouth every 12 (twelve) hours as needed (Restlessness and agitation). 60 tablet 1   torsemide 40 MG TABS Take 40 mg by mouth every morning. 90 tablet 3   traMADol (ULTRAM) 50 MG tablet Take 50 mg by mouth every 6 (six) hours as needed for moderate pain.     No facility-administered medications prior to visit.     Allergies:   Bee venom,  Codeine, Propoxyphene, and Valsartan   Social History   Socioeconomic History   Marital status: Married    Spouse name: [name]   Number of children: Not on file   Years of education: Not on file   Highest education level: Not on file  Occupational History   Not on file  Tobacco Use   Smoking status: Every Day    Packs/day: 0.50    Years: 45.00    Total pack years: 22.50    Types: Cigarettes   Smokeless tobacco: Never   Tobacco comments:  burns them up  Vaping Use   Vaping Use: Never used  Substance and Sexual Activity   Alcohol use: No    Alcohol/week: 0.0 standard drinks of alcohol   Drug use: Yes    Types: Marijuana    Comment: almost daily for pain   Sexual activity: Never  Other Topics Concern   Not on file  Social History Narrative   Not on file   Social Determinants of Health   Financial Resource Strain: Low Risk  (03/13/2021)   Overall Financial Resource Strain (CARDIA)    Difficulty of Paying Living Expenses: Not hard at all  Food Insecurity: No Food Insecurity (04/21/2021)   Hunger Vital Sign    Worried About Running Out of Food in the Last Year: Never true    Ran Out of Food in the Last Year: Never true  Transportation Needs: No Transportation Needs (04/21/2021)   PRAPARE - Hydrologist (Medical): No    Lack of Transportation (Non-Medical): No  Physical Activity: Not on file  Stress: No Stress Concern Present (03/13/2021)   [place]    Feeling of Stress : Only a little  Social Connections: Socially Integrated (03/13/2021)   Social Connection and Isolation Panel [NHANES]    Frequency of Communication with Friends and Family: Twice a week    Frequency of Social Gatherings with Friends and Family: Twice a week    Attends Religious Services: 1 to 4 times per year    Active Member of [employer] or Organizations: Yes    Attends Archivist Meetings: 1 to 4  times per year    Marital Status: Married     Family History:  The patient's ***family history includes Heart disease in his mother.   Review of Systems:    Please see the history of present illness.     All other systems reviewed and are otherwise negative except as noted above.   Physical Exam:    VS:  There were no vitals taken for this visit.   General: Well developed, well nourished,male appearing in no acute distress. Head: Normocephalic, atraumatic. Neck: No carotid bruits. JVD not elevated.  Lungs: Respirations regular and unlabored, without wheezes or rales.  Heart: ***Regular rate and rhythm. No S3 or S4.  No murmur, no rubs, or gallops appreciated. Abdomen: Appears non-distended. No obvious abdominal masses. Msk:  Strength and tone appear normal for age. No obvious joint deformities or effusions. Extremities: No clubbing or cyanosis. No edema.  Distal pedal pulses are 2+ bilaterally. Neuro: Alert and oriented X 3. Moves all extremities spontaneously. No focal deficits noted. Psych:  Responds to questions appropriately with a normal affect. Skin: No rashes or lesions noted  Wt Readings from Last 3 Encounters:  07/07/21 110 lb 3.7 oz (50 kg)  05/26/21 120 lb (54.4 kg)  05/23/21 120 lb (54.4 kg)        Studies/Labs Reviewed:   EKG:  EKG is*** ordered today.  The ekg ordered today demonstrates ***  Recent Labs: 07/04/2021: ALT 18; B Natriuretic Peptide 685.0; Magnesium 2.2; TSH 1.155 07/07/2021: BUN 42; Creatinine, Ser 1.86; Hemoglobin 7.8; Platelets 331; Potassium 4.2; Sodium 132   Lipid Panel    Component Value Date/Time   CHOL 114 05/28/2020 0028   TRIG 49 05/28/2020 0028   HDL 41 05/28/2020 0028   CHOLHDL 2.8 05/28/2020 0028   VLDL 10 05/28/2020 0028   LDLCALC 63 05/28/2020 0028  Additional studies/ records that were reviewed today include:   Echocardiogram: 07/04/2021 IMPRESSIONS     1. Left ventricular ejection fraction, by estimation, is 35  to 40%. The  left ventricle has moderately decreased function. The left ventricle  demonstrates global hypokinesis. Left ventricular diastolic parameters are  consistent with Grade I diastolic  dysfunction (impaired relaxation).   2. Right ventricular systolic function is normal. The right ventricular  size is normal. There is moderately elevated pulmonary artery systolic  pressure. The estimated right ventricular systolic pressure is 29.7 mmHg.   3. The mitral valve is grossly normal. Trivial mitral valve  regurgitation. No evidence of mitral stenosis.   4. The aortic valve is tricuspid. There is mild calcification of the  aortic valve. Aortic valve regurgitation is not visualized. Aortic valve  sclerosis is present, with no evidence of aortic valve stenosis.   5. The inferior vena cava is dilated in size with >50% respiratory  variability, suggesting right atrial pressure of 8 mmHg.   Comparison(s): Changes from prior study are noted. EF has improved  ~35-40%.   Assessment:    No diagnosis found.   Plan:   In order of problems listed above:  ***    Shared Decision Making/Informed Consent:   {Are you ordering a CV Procedure (e.g. stress test, cath, DCCV, TEE, etc)?   Press F2        :[id number]}    Medication Adjustments/Labs and Tests Ordered: Current medicines are reviewed at length with the patient today.  Concerns regarding medicines are outlined above.  Medication changes, Labs and Tests ordered today are listed in the Patient Instructions below. There are no Patient Instructions on file for this visit.   Signed, Erma Heritage, PA-C  07/13/2021 11:37 AM    [facility] [address] [place], [place] [address] Phone: [phone] Fax: [phone]

## 2021-07-14 ENCOUNTER — Ambulatory Visit (INDEPENDENT_AMBULATORY_CARE_PROVIDER_SITE_OTHER): Payer: Medicare Other | Admitting: Student

## 2021-07-14 ENCOUNTER — Encounter: Payer: Self-pay | Admitting: Student

## 2021-07-14 VITALS — BP 126/66 | HR 61 | Ht 73.0 in

## 2021-07-14 DIAGNOSIS — I739 Peripheral vascular disease, unspecified: Secondary | ICD-10-CM | POA: Diagnosis not present

## 2021-07-14 DIAGNOSIS — I5020 Unspecified systolic (congestive) heart failure: Secondary | ICD-10-CM

## 2021-07-14 DIAGNOSIS — N1831 Chronic kidney disease, stage 3a: Secondary | ICD-10-CM

## 2021-07-14 DIAGNOSIS — I10 Essential (primary) hypertension: Secondary | ICD-10-CM | POA: Diagnosis not present

## 2021-07-14 DIAGNOSIS — Z79899 Other long term (current) drug therapy: Secondary | ICD-10-CM

## 2021-07-14 DIAGNOSIS — I480 Paroxysmal atrial fibrillation: Secondary | ICD-10-CM

## 2021-07-14 MED ORDER — ATORVASTATIN CALCIUM 40 MG PO TABS: 40.0000 mg | ORAL_TABLET | Freq: Every day | ORAL | 0 refills | Status: DC

## 2021-07-17 ENCOUNTER — Telehealth: Payer: Self-pay | Admitting: Cardiology

## 2021-07-17 ENCOUNTER — Other Ambulatory Visit: Payer: Self-pay | Admitting: *Deleted

## 2021-07-17 DIAGNOSIS — E1165 Type 2 diabetes mellitus with hyperglycemia: Secondary | ICD-10-CM | POA: Insufficient documentation

## 2021-07-17 DIAGNOSIS — S78119A Complete traumatic amputation at level between unspecified hip and knee, initial encounter: Secondary | ICD-10-CM | POA: Insufficient documentation

## 2021-07-17 DIAGNOSIS — Z741 Need for assistance with personal care: Secondary | ICD-10-CM | POA: Insufficient documentation

## 2021-07-17 DIAGNOSIS — R0600 Dyspnea, unspecified: Secondary | ICD-10-CM

## 2021-07-17 DIAGNOSIS — R059 Cough, unspecified: Secondary | ICD-10-CM

## 2021-07-17 DIAGNOSIS — Z7689 Persons encountering health services in other specified circumstances: Secondary | ICD-10-CM | POA: Insufficient documentation

## 2021-07-17 NOTE — Patient Outreach (Signed)
[facility]) Care Management Telephonic RN Care Manager Note   07/17/2021 Name:  Allen Yu MRN:  [id number] DOB:  [birth year]  Summary: Successful outreach. Spoke with wife, [name] with [name] in the background He is requesting to be driven to town.   Reviewed [name] recent 07/07/21 ED visit/admission for Acute on chronic systolic CHF [name] reported She was assisting [name] with some  Activities of daily living (ADLs) at home on their bed when he began coughing as she rolled him over. His oxygen saturation decreased to 72% and he reported he was not able to breath. [name] called EMS to take [name] to the ED  Torsemide was changed to 60 mg for 3 days  [name] reports the xray in the ED visit indicated the patient had fluid in his right lung (pulmonary edema) [name] states she will request another chest x ray if the pt continue to have a cough and shortness of breath His diabetes has improved with a Hgb A1c of 6.0 on 07/04/21 (was 8.8 on 01/14/22) Advance home health RN was offered after the ED discharge [name] confirms Mr Musick still continues with fluid and diet noncompliance She reports attempting without success with getting him to maintain fluid intake of less than 67 oz   Recommendations/Changes made from today's visit: Successful follow up  Encouraged fluid and diet compliance.  Suggested ice chips/shaved ice to assist with [name] craving for water/fluids   Subjective: Allen Yu is an 76 y.o. year old male who is a primary patient of [name], MD. The care management team was consulted for assistance with care management and/or care coordination needs.    Telephonic RN Care Manager completed Telephone Visit today.   Objective:  Medications Reviewed Today     Reviewed by Carlyon Prows (Physician Assistant Certified) on 07/14/21 at 1652  Med List Status: <None>   Medication Order Taking? Sig Documenting Provider Last  Dose Status Informant  acetaminophen (TYLENOL) 500 MG tablet [id number] Yes Take 500 mg by mouth 2 (two) times daily as needed for moderate pain. [provider] Taking Active Spouse/Significant Other  Alcohol Swabs (ALCOHOL PADS) 70 % PADS [id number] Yes USE 1 PAD AS DIRECTED [provider] Taking Active Spouse/Significant Other  aspirin EC 81 MG tablet [id number] Yes Take 1 tablet (81 mg total) by mouth daily with breakfast. [name], MD Taking Active   B-D UF III MINI PEN NEEDLES 31G X 5 MM MISC [id number] Yes SMARTSIG:1 Each SUB-Q Daily [provider] Taking Active Spouse/Significant Other  clopidogrel (PLAVIX) 75 MG tablet [id number] Yes Take 1 tablet (75 mg total) by mouth daily. [name], MD Taking Active   donepezil (ARICEPT) 10 MG tablet [id number] Yes Take 10 mg by mouth at bedtime. [provider] Taking Active Spouse/Significant Other           Med Note [facility], COURAGE   Fri Jul 07, 2021 10:30 AM)    feeding supplement, ENSURE COMPLETE, (ENSURE COMPLETE) LIQD [id number] Yes Take 237 mLs by mouth 2 (two) times daily between meals. [name], MD Taking Active Spouse/Significant Other  ferrous sulfate 325 (65 FE) MG tablet [id number] Yes Take 325 mg by mouth daily with breakfast. [provider] Taking Active Spouse/Significant Other  gabapentin (NEURONTIN) 100 MG capsule [id number] Yes Take 1 capsule (100 mg total) by mouth 2 (two) times daily. [name], MD Taking Active   LANTUS 100 UNIT/ML injection [id number] Yes Inject 0.08 mLs (8  Units total) into the skin at bedtime. [name], MD Taking Active   losartan (COZAAR) 50 MG tablet [id number] Yes Take 1 tablet (50 mg total) by mouth daily. [name], MD Taking Active   metoprolol succinate (TOPROL-XL) 50 MG 24 hr tablet [id number] Yes Take 1 tablet (50 mg total) by mouth daily. Take with or immediately following a meal. Strader, [name], PA-C Taking Active  Spouse/Significant Other  Multiple Vitamins-Minerals (MULTIVITAMIN WITH MINERALS) tablet [id number] Yes Take 1 tablet by mouth daily. [name], MD Taking Active Spouse/Significant Other  omeprazole (PRILOSEC) 20 MG capsule [id number] Yes Take 1 capsule by mouth daily. [provider] Taking Active Spouse/Significant Other  ondansetron (ZOFRAN) 4 MG tablet [id number] Yes Take 1 tablet (4 mg total) by mouth every 6 (six) hours as needed for nausea. [name], MD Taking Active Spouse/Significant Other  risperiDONE (RISPERDAL) 0.5 MG tablet [id number] Yes Take 1 tablet (0.5 mg total) by mouth every 12 (twelve) hours as needed (Restlessness and agitation). [name], MD Taking Active Spouse/Significant Other  torsemide 40 MG TABS [id number] Yes Take 40 mg by mouth every morning. [name], MD Taking Active   traMADol (ULTRAM) 50 MG tablet [id number] Yes Take 50 mg by mouth every 6 (six) hours as needed for moderate pain. [provider] Taking Active Spouse/Significant Other  Med List Note [name], CPhT 07/09/16 7564): Dr. [name] [phone]             SDOH:  (Social Determinants of Health) assessments and interventions performed:    Care Plan  Review of patient past medical history, allergies, medications, health status, including review of consultants reports, laboratory and other test data, was performed as part of comprehensive evaluation for care management services.   Care Plan : RN Care Manager Plan of Care  Updates made by Clinton Gallant, RN since 07/17/2021 12:00 AM     Problem: Complex Care Coordination Needs and disease management in patient with DM II, CKD 3, Afib, PTSD   Priority: High     Long-Range Goal: Establish Plan of Care for Management Complex SDOH Barriers, disease management and Care Coordination Needs in patient with DM II, CKD 3, Afib, PTSD   Start Date: 12/21/2020  Recent Progress: On track  Priority:  High  Note:   Current Barriers:  Knowledge Deficits related to plan of care for management of Atrial Fibrillation, DMII, CKD Stage 3, and PTSD/dementia  Care Coordination needs related to Lacks knowledge of community resource: [facility] benefits Health behaviors, bilateral hearing (12/120/22 now has hearing aides), [place] Veteran with PTSD/psychosis and reported psychosocial population concerns, memory concerns   RN CM Clinical Goal(s):  Patient will work with [employer] administration ([facility]) benefit providers to continue home management of DM II, Afib, CKD, PTSD/dementia as evidenced by decrease in admissions and improved lab values, behavior  through collaboration with Medical illustrator, provider, and care team.   Interventions: Follow up outreaches to wife and patient as allowed to further assess and assist with resources/guidance, care coordination and/or disease management/education  Inter-disciplinary care team collaboration (see longitudinal plan of care) Evaluation of current treatment plan related to  self management and patient's adherence to plan as established by provider 01/10/21 care coordination completed for home assistance with catheter change monthly no available via home health per medicare guideline changes (not skilled need) -RN CM outreach to [facility] staff, updated wife 01/10/21 Confirmed with wife further rehabilitation outpatient near [facility] ordered by pcp.   03/13/21  wife reports improvements with outpatient therapy, pending prosthetic, better appetite, better sleeping continues with [facility] home care services, psychology services, urology services- pt continues to smoke but not interested in smoke cessation   AFIB Interventions: (Status:  Goal on track:  Yes.) Long Term Goal   Reviewed importance of adherence to anticoagulant exactly as prescribed Counseled on importance of regular laboratory monitoring as prescribed Afib action plan reviewed Screening for signs and  symptoms of depression related to chronic disease state  Assessed social determinant of health barriers    Chronic Kidney Disease Interventions:  (Status:  Goal on track:  Yes.) Long Term Goal  07/17/21 continues to maintain but with some fluid intake non compliance 06/16/21 improvements noted 04/21/21 recent admission for sepsis continues with foley  Evaluation of current treatment plan related to chronic kidney disease self management and patient's adherence to plan as established by provider      Engage patient in early, proactive and ongoing discussion about goals of care and what matters most to them    Suggested leg bag with Velcro to prevent penial irritation during the day  Last practice recorded BP readings:  BP Readings from Last 3 Encounters:  07/14/21 126/66  05/29/21 136/74  05/26/21 128/68  Most recent eGFR/CrCl: No results found for: "EGFR"  No components found for: "CRCL"    Diabetes Interventions:  (Status:  Goal on track:  Yes.) Long Term Goal 07/17/21 Improved cbg values and A1c pt and wife commended  06/16/21 Appetite good and not always following his diabetic diet well (honey buns) Assessed patient's understanding of A1c goal: <7% Discussed plans with patient for ongoing care management follow up and provided patient with direct contact information for care management team Review of patient status, including review of consultants reports, relevant laboratory and other test results, and medications completed Lab Results  Component Value Date   HGBA1C 6.0 (H) 07/04/2021   Dementia:  (Status:  Goal on track:  Yes.)  Long Term Goal 07/17/21 no change [name] continues to assist with pt socialization 06/16/21 Everyday the wife tries to take pt out of the home for socialization or outpatient PT and has a planned beach trip for the summer  Evaluation of current treatment plan related to misuse of: Dementia with other behavioral disturbances Collaborated with [facility] personal care  services, psychology services, pcp, outpatient rehab services, Emotional Support Provided to patient/caregiver, Discussed importance of attendance to all provider appointments, and Advised to contact provider for new or worsening symptoms   Interdisciplinary Collaboration Interventions:  (Status: Goal Met.) Short Term Goal   Collaborated with RN CM to initiate plan of care to address needs related to Limited access to caregiver and Lacks knowledge of community resource: VA home care aides, home foley care  in patient with CHF, DMII, CKD Stage 3, and recurrent UTIs Collaboration with [name], MD regarding development and update of comprehensive plan of care as evidenced by provider attestation and co-signature Inter-disciplinary care team collaboration  04/21/21 Received wife's voice message Reviewed EPIC care everywhere, found number for [facility] RN CM Thalia Bloodgood [phone] Outreach to F. Ouida Sills to update her on wife voice message and aide. Wife to be called  RN CM discussed the risks of foley catheters to include recurrent UTIs  RN CM discussed Supra pubic catheters, condom catheters (but only if pt is not having urinary retention)  Wife was encouraged to speak with the urology staff about these options RN CM offered  suggestions to keep the foley catheter bag secure to Mr Bruney's thigh to prevent pulling, etc Garter belts or thigh hose to secure bag to his thigh, a bigger foley bag to decrease having to empty frequently   06/16/21  Wife has been approved to care for patient by [facility] vs having another aide come into the home. Plus she has respite services available 4 hour prn   Heart Failure Interventions:  (Status:  New goal.) Long Term Goal 07/17/21 Reviewed Mr Kosar recent 07/07/21 ED visit/admission for Acute on chronic systolic CHF [name] reported She was assisting Mr Bogardus with some  Activities of daily living (ADLs) at home on their bed when he began  coughing as she rolled him over. His oxygen saturation decreased to 72% and he reported he was not able to breath. [name] called EMS to take Mr Thwaites to the ED  Torsemide was changed to 60 mg for 3 days  [name] reports the xray in the ED visit indicated the patient had fluid in his right lung (pulmonary edema) [name] states she will request another chest x ray if the pt continue to have a cough and shortness of breath Basic overview and discussion of pathophysiology of Heart Failure reviewed Provided education on low sodium diet Reviewed role of diuretics in prevention of fluid overload and management of heart failure; Discussed the importance of keeping all appointments with provider Screening for signs and symptoms of depression related to chronic disease state  Assessed social determinant of health barriers   07/17/21 fluid and diet compliance.  Suggested ice chips/shaved ice to assist with Mr Giustino craving for water/fluids  Patient Goals/Self-Care Activities: Take all medications as prescribed Attend all scheduled provider appointments Call provider office for new concerns or questions  call office if I gain more than 2 pounds in one day or 5 pounds in one week use salt in moderation watch for swelling in feet, ankles and legs every day 07/17/21  fluid and diet compliance.  Suggested ice chips/shaved ice to assist with Mr Peot craving for water/fluids  Follow Up Plan:  The patient has been provided with contact information for the care management team and has been advised to call with any health related questions or concerns.  The care management team will reach out to the patient again over the next 30+ business days.       Plan: The patient has been provided with contact information for the care management team and has been advised to call with any health related questions or concerns.   Hasel Janish L. Noelle Penner, RN, BSN, CCM [facility] Telephonic Care Management Care Coordinator Office  number [phone] [facility] number [phone] Fax number [phone]

## 2021-07-18 ENCOUNTER — Encounter (HOSPITAL_COMMUNITY): Payer: Self-pay | Admitting: Physical Therapy

## 2021-07-18 ENCOUNTER — Ambulatory Visit (HOSPITAL_COMMUNITY): Payer: Medicare Other | Admitting: Physical Therapy

## 2021-07-18 ENCOUNTER — Telehealth: Payer: Self-pay

## 2021-07-18 DIAGNOSIS — T502X5A Adverse effect of carbonic-anhydrase inhibitors, benzothiadiazides and other diuretics, initial encounter: Secondary | ICD-10-CM | POA: Diagnosis present

## 2021-07-18 DIAGNOSIS — K219 Gastro-esophageal reflux disease without esophagitis: Secondary | ICD-10-CM | POA: Diagnosis present

## 2021-07-18 DIAGNOSIS — D649 Anemia, unspecified: Secondary | ICD-10-CM | POA: Diagnosis not present

## 2021-07-18 DIAGNOSIS — I739 Peripheral vascular disease, unspecified: Secondary | ICD-10-CM | POA: Diagnosis not present

## 2021-07-18 DIAGNOSIS — S81801S Unspecified open wound, right lower leg, sequela: Secondary | ICD-10-CM | POA: Insufficient documentation

## 2021-07-18 DIAGNOSIS — I4891 Unspecified atrial fibrillation: Secondary | ICD-10-CM | POA: Diagnosis not present

## 2021-07-18 DIAGNOSIS — E785 Hyperlipidemia, unspecified: Secondary | ICD-10-CM | POA: Diagnosis present

## 2021-07-18 DIAGNOSIS — E1142 Type 2 diabetes mellitus with diabetic polyneuropathy: Secondary | ICD-10-CM | POA: Diagnosis present

## 2021-07-18 DIAGNOSIS — E860 Dehydration: Secondary | ICD-10-CM | POA: Diagnosis present

## 2021-07-18 DIAGNOSIS — E1122 Type 2 diabetes mellitus with diabetic chronic kidney disease: Secondary | ICD-10-CM | POA: Diagnosis present

## 2021-07-18 DIAGNOSIS — I129 Hypertensive chronic kidney disease with stage 1 through stage 4 chronic kidney disease, or unspecified chronic kidney disease: Secondary | ICD-10-CM | POA: Diagnosis not present

## 2021-07-18 DIAGNOSIS — Z7982 Long term (current) use of aspirin: Secondary | ICD-10-CM | POA: Diagnosis not present

## 2021-07-18 DIAGNOSIS — I5022 Chronic systolic (congestive) heart failure: Secondary | ICD-10-CM | POA: Diagnosis not present

## 2021-07-18 DIAGNOSIS — Z888 Allergy status to other drugs, medicaments and biological substances status: Secondary | ICD-10-CM | POA: Diagnosis not present

## 2021-07-18 DIAGNOSIS — Z87442 Personal history of urinary calculi: Secondary | ICD-10-CM | POA: Diagnosis not present

## 2021-07-18 DIAGNOSIS — S91104S Unspecified open wound of right lesser toe(s) without damage to nail, sequela: Secondary | ICD-10-CM

## 2021-07-18 DIAGNOSIS — Z5329 Procedure and treatment not carried out because of patient's decision for other reasons: Secondary | ICD-10-CM | POA: Diagnosis not present

## 2021-07-18 DIAGNOSIS — I96 Gangrene, not elsewhere classified: Secondary | ICD-10-CM | POA: Diagnosis present

## 2021-07-18 DIAGNOSIS — S91201S Unspecified open wound of right great toe with damage to nail, sequela: Secondary | ICD-10-CM

## 2021-07-18 DIAGNOSIS — F17210 Nicotine dependence, cigarettes, uncomplicated: Secondary | ICD-10-CM | POA: Diagnosis present

## 2021-07-18 DIAGNOSIS — S91301S Unspecified open wound, right foot, sequela: Secondary | ICD-10-CM | POA: Insufficient documentation

## 2021-07-18 DIAGNOSIS — Z885 Allergy status to narcotic agent status: Secondary | ICD-10-CM | POA: Diagnosis not present

## 2021-07-18 DIAGNOSIS — I130 Hypertensive heart and chronic kidney disease with heart failure and stage 1 through stage 4 chronic kidney disease, or unspecified chronic kidney disease: Secondary | ICD-10-CM | POA: Diagnosis not present

## 2021-07-18 DIAGNOSIS — F209 Schizophrenia, unspecified: Secondary | ICD-10-CM | POA: Diagnosis not present

## 2021-07-18 DIAGNOSIS — I70229 Atherosclerosis of native arteries of extremities with rest pain, unspecified extremity: Secondary | ICD-10-CM | POA: Diagnosis not present

## 2021-07-18 DIAGNOSIS — I509 Heart failure, unspecified: Secondary | ICD-10-CM | POA: Diagnosis not present

## 2021-07-18 DIAGNOSIS — M79671 Pain in right foot: Secondary | ICD-10-CM

## 2021-07-18 DIAGNOSIS — F4310 Post-traumatic stress disorder, unspecified: Secondary | ICD-10-CM | POA: Diagnosis present

## 2021-07-18 DIAGNOSIS — S78112A Complete traumatic amputation at level between left hip and knee, initial encounter: Secondary | ICD-10-CM | POA: Diagnosis not present

## 2021-07-18 DIAGNOSIS — Z91030 Bee allergy status: Secondary | ICD-10-CM | POA: Diagnosis not present

## 2021-07-18 DIAGNOSIS — N1832 Chronic kidney disease, stage 3b: Secondary | ICD-10-CM | POA: Diagnosis not present

## 2021-07-18 DIAGNOSIS — D631 Anemia in chronic kidney disease: Secondary | ICD-10-CM | POA: Diagnosis not present

## 2021-07-18 DIAGNOSIS — N189 Chronic kidney disease, unspecified: Secondary | ICD-10-CM | POA: Diagnosis not present

## 2021-07-18 DIAGNOSIS — F0390 Unspecified dementia without behavioral disturbance: Secondary | ICD-10-CM | POA: Diagnosis not present

## 2021-07-18 DIAGNOSIS — E1152 Type 2 diabetes mellitus with diabetic peripheral angiopathy with gangrene: Secondary | ICD-10-CM | POA: Diagnosis present

## 2021-07-18 DIAGNOSIS — N179 Acute kidney failure, unspecified: Secondary | ICD-10-CM | POA: Diagnosis not present

## 2021-07-18 DIAGNOSIS — I480 Paroxysmal atrial fibrillation: Secondary | ICD-10-CM | POA: Diagnosis present

## 2021-07-18 DIAGNOSIS — E1129 Type 2 diabetes mellitus with other diabetic kidney complication: Secondary | ICD-10-CM | POA: Diagnosis not present

## 2021-07-18 DIAGNOSIS — N281 Cyst of kidney, acquired: Secondary | ICD-10-CM | POA: Diagnosis not present

## 2021-07-18 NOTE — Telephone Encounter (Signed)
CXR-2V ordered per provider for dyspnea and coughing. Sent to scheduler to schedule.

## 2021-07-19 ENCOUNTER — Ambulatory Visit (INDEPENDENT_AMBULATORY_CARE_PROVIDER_SITE_OTHER): Payer: Medicare Other | Admitting: Vascular Surgery

## 2021-07-19 ENCOUNTER — Other Ambulatory Visit: Payer: Self-pay

## 2021-07-19 ENCOUNTER — Encounter: Payer: Self-pay | Admitting: Vascular Surgery

## 2021-07-19 VITALS — BP 128/77 | HR 63 | Temp 98.2°F | Resp 14 | Ht 73.0 in | Wt 110.0 lb

## 2021-07-19 DIAGNOSIS — N189 Chronic kidney disease, unspecified: Secondary | ICD-10-CM | POA: Diagnosis not present

## 2021-07-19 DIAGNOSIS — I70229 Atherosclerosis of native arteries of extremities with rest pain, unspecified extremity: Secondary | ICD-10-CM

## 2021-07-19 DIAGNOSIS — D649 Anemia, unspecified: Secondary | ICD-10-CM | POA: Diagnosis not present

## 2021-07-19 DIAGNOSIS — I739 Peripheral vascular disease, unspecified: Secondary | ICD-10-CM

## 2021-07-19 DIAGNOSIS — N179 Acute kidney failure, unspecified: Secondary | ICD-10-CM | POA: Diagnosis not present

## 2021-07-19 DIAGNOSIS — I129 Hypertensive chronic kidney disease with stage 1 through stage 4 chronic kidney disease, or unspecified chronic kidney disease: Secondary | ICD-10-CM | POA: Diagnosis not present

## 2021-07-19 NOTE — Progress Notes (Signed)
[facility] [place]  Patient name: Allen Yu MRN: [id number] DOB: 02-Dec-1945 Sex: male  REASON FOR VISIT: Evaluation gangrenous changes right foot  HPI: Allen Yu is a 76 y.o. male here today for evaluation gangrenous changes right foot.  He has an extensive past history of peripheral vascular disease.  He had presented with critical limb ischemia several years ago and underwent attempted limb salvage with Dr. 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  He was seen last fall with critical limb ischemia on the right foot.  He underwent intervention with Dr. Carlis Abbott on 10/27/2020 with angioplasty of his right SFA and popliteal artery and also right tibioperoneal trunk.  He presents now with gangrenous changes on his right foot.  His wife is here with him today and she is very supportive in his care.  She reports that he had cataract surgery with an overnight stay and she noticed blisters on his toes following the procedure.  He has had progression of this since this procedure in April.  He has been seen by wound center and has referred to [place] to determine adequacy of arterial flow.  He reports severe rest pain  Past Medical History:  Diagnosis Date   Anemia    Arthritis    Carotid stenosis    Chronic back pain    Chronic HFrEF (heart failure with reduced ejection fraction) ([place])    a. 09/2020 Echo: EF 45%, mild LVH; b. 12/2020 Echo: EF 25-30%, glob HK, mod LVH, mildly reduced RV fxn, RVSP 65.17mHg, mod BAE. Triv effusion. Mod MR/TR.   CKD (chronic kidney disease), stage III ([place]    Constipation    Dementia ([place]    Diabetes mellitus    Dilated cardiomyopathy ([place]    a. 09/2020 Echo: EF 45%; b. 12/2020 Echo: EF 25-30%.   Foot drop, right    GERD (gastroesophageal reflux disease)    History of kidney stones    Hypertension    Lung nodule    PAF (paroxysmal atrial fibrillation) (HCC)    Peripheral neuropathy     Peripheral vascular disease ([place]    a. 06/2020 s/p L AKA; b. 10/2020 s/p R SFA and above-knee popliteal PTA.   PTSD (post-traumatic stress disorder)    Schizophrenia ([place]    Tuberculosis    Treated    Family History  Problem Relation Age of Onset   Heart disease Mother        before age 76   SOCIAL HISTORY: Social History   Tobacco Use   Smoking status: Every Day    Packs/day: 0.50    Years: 45.00    Total pack years: 22.50    Types: Cigarettes   Smokeless tobacco: Never   Tobacco comments:    burns them up  Substance Use Topics   Alcohol use: No    Alcohol/week: 0.0 standard drinks of alcohol    Allergies  Allergen Reactions   Bee Venom Anaphylaxis   Codeine Other (See Comments)    incoherent  Other reaction(s): Delirium   Propoxyphene Other (See Comments)    Dizziness, "Makes me feel drunk" Other reaction(s): Dizziness   Valsartan Other (See Comments)    incoherent Other reaction(s): Delirium    Current Outpatient Medications  Medication Sig Dispense Refill   acetaminophen (TYLENOL) 500 MG tablet Take 500 mg by mouth 2 (two) times daily as needed for moderate pain.     Alcohol Swabs (ALCOHOL PADS) 70 % PADS USE 1  PAD AS DIRECTED     aspirin EC 81 MG tablet Take 1 tablet (81 mg total) by mouth daily with breakfast. 90 tablet 2   atorvastatin (LIPITOR) 40 MG tablet Take 1 tablet (40 mg total) by mouth at bedtime. 90 tablet 0   B-D UF III MINI PEN NEEDLES 31G X 5 MM MISC SMARTSIG:1 Each SUB-Q Daily     clopidogrel (PLAVIX) 75 MG tablet Take 1 tablet (75 mg total) by mouth daily. 90 tablet 3   donepezil (ARICEPT) 10 MG tablet Take 10 mg by mouth at bedtime.     feeding supplement, ENSURE COMPLETE, (ENSURE COMPLETE) LIQD Take 237 mLs by mouth 2 (two) times daily between meals. 23700 mL 1   ferrous sulfate 325 (65 FE) MG tablet Take 325 mg by mouth daily with breakfast.     gabapentin (NEURONTIN) 100 MG capsule Take 1 capsule (100 mg total) by mouth 2 (two) times  daily. 60 capsule 3   LANTUS 100 UNIT/ML injection Inject 0.08 mLs (8 Units total) into the skin at bedtime. 10 mL 11   losartan (COZAAR) 50 MG tablet Take 1 tablet (50 mg total) by mouth daily. 90 tablet 3   metoprolol succinate (TOPROL-XL) 50 MG 24 hr tablet Take 1 tablet (50 mg total) by mouth daily. Take with or immediately following a meal. 90 tablet 3   Multiple Vitamins-Minerals (MULTIVITAMIN WITH MINERALS) tablet Take 1 tablet by mouth daily. 120 tablet 2   omeprazole (PRILOSEC) 20 MG capsule Take 1 capsule by mouth daily.     ondansetron (ZOFRAN) 4 MG tablet Take 1 tablet (4 mg total) by mouth every 6 (six) hours as needed for nausea. 20 tablet 0   risperiDONE (RISPERDAL) 0.5 MG tablet Take 1 tablet (0.5 mg total) by mouth every 12 (twelve) hours as needed (Restlessness and agitation). 60 tablet 1   torsemide 40 MG TABS Take 40 mg by mouth every morning. 90 tablet 3   traMADol (ULTRAM) 50 MG tablet Take 50 mg by mouth every 6 (six) hours as needed for moderate pain.     No current facility-administered medications for this visit.    REVIEW OF SYSTEMS:  '[X]'$  denotes positive finding, '[ ]'$  denotes negative finding Cardiac  Comments:  Chest pain or chest pressure:    Shortness of breath upon exertion:    Short of breath when lying flat:    Irregular heart rhythm:        Vascular    Pain in calf, thigh, or hip brought on by ambulation:    Pain in feet at night that wakes you up from your sleep:  x   Blood clot in your veins:    Leg swelling:           PHYSICAL EXAM: Vitals:   07/19/21 1403  BP: 128/77  Pulse: 63  Resp: 14  Temp: 98.2 F (36.8 C)  TempSrc: Temporal  SpO2: 97%  Weight: 110 lb (49.9 kg)  Height: '6\' 1"'$  (1.854 m)    GENERAL: The patient is a well-nourished male, in no acute distress. The vital signs are documented above. CARDIOVASCULAR: No palpable popliteal pulse on the right. PULMONARY: There is good air exchange  MUSCULOSKELETAL: There are no major  deformities or cyanosis. NEUROLOGIC: No focal weakness or paresthesias are detected. 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  He has an eschar over the pretibial area just above his ankle which appears to be full-thickness.  He does have a blister  on the medial aspect of his foot. PSYCHIATRIC: The patient has a normal affect.  DATA:  He did not undergo noninvasive studies in our office today.  He has very dampened monophasic dorsalis pedis, posterior tibial and peroneal signals.  He also has dampened monophasic flow at the popliteal level.  MEDICAL ISSUES: Critical limb ischemia.  He does transfer from bed to chair and from chair to car.  I explained that he clearly does not have adequate arterial flow for healing and is at extremely high risk for amputation.  I have recommended repeat arteriography to determine if endovascular treatment is possible to improve flow to his foot.  Also explained that he would require amputation of his first and second toe for limb salvage.  He has known moderate renal insufficiency with a creatinine of 1.8.  He will have limited contrast with his procedure.  We will schedule this as soon as possible    Rosetta Posner, MD FACS [facility] [place] Office Tel [phone]  Note: Portions of this report may have been transcribed using voice recognition software.  Every effort has been made to ensure accuracy; however, inadvertent computerized transcription errors may still be present.

## 2021-07-20 ENCOUNTER — Emergency Department (HOSPITAL_COMMUNITY): Payer: Medicare Other

## 2021-07-20 ENCOUNTER — Other Ambulatory Visit: Payer: Self-pay

## 2021-07-20 ENCOUNTER — Inpatient Hospital Stay (HOSPITAL_COMMUNITY)
Admission: RE | Admit: 2021-07-20 | Discharge: 2021-07-25 | DRG: 683 | Discharge status: Against Medical Advice | Payer: Medicare Other | Attending: Internal Medicine | Admitting: Internal Medicine

## 2021-07-20 ENCOUNTER — Encounter (HOSPITAL_COMMUNITY)
Admission: RE | Discharge status: Against Medical Advice | Payer: Self-pay | Source: Home / Self Care | Attending: Internal Medicine

## 2021-07-20 ENCOUNTER — Encounter (HOSPITAL_COMMUNITY): Payer: Self-pay | Admitting: Vascular Surgery

## 2021-07-20 DIAGNOSIS — I4891 Unspecified atrial fibrillation: Secondary | ICD-10-CM | POA: Diagnosis present

## 2021-07-20 DIAGNOSIS — T502X5A Adverse effect of carbonic-anhydrase inhibitors, benzothiadiazides and other diuretics, initial encounter: Secondary | ICD-10-CM | POA: Diagnosis present

## 2021-07-20 DIAGNOSIS — I5042 Chronic combined systolic (congestive) and diastolic (congestive) heart failure: Secondary | ICD-10-CM | POA: Diagnosis present

## 2021-07-20 DIAGNOSIS — Z7982 Long term (current) use of aspirin: Secondary | ICD-10-CM

## 2021-07-20 DIAGNOSIS — E1122 Type 2 diabetes mellitus with diabetic chronic kidney disease: Secondary | ICD-10-CM | POA: Diagnosis present

## 2021-07-20 DIAGNOSIS — E785 Hyperlipidemia, unspecified: Secondary | ICD-10-CM | POA: Diagnosis present

## 2021-07-20 DIAGNOSIS — I130 Hypertensive heart and chronic kidney disease with heart failure and stage 1 through stage 4 chronic kidney disease, or unspecified chronic kidney disease: Secondary | ICD-10-CM | POA: Diagnosis present

## 2021-07-20 DIAGNOSIS — K219 Gastro-esophageal reflux disease without esophagitis: Secondary | ICD-10-CM | POA: Diagnosis present

## 2021-07-20 DIAGNOSIS — Z8249 Family history of ischemic heart disease and other diseases of the circulatory system: Secondary | ICD-10-CM

## 2021-07-20 DIAGNOSIS — Z5329 Procedure and treatment not carried out because of patient's decision for other reasons: Secondary | ICD-10-CM | POA: Diagnosis not present

## 2021-07-20 DIAGNOSIS — E1142 Type 2 diabetes mellitus with diabetic polyneuropathy: Secondary | ICD-10-CM | POA: Diagnosis present

## 2021-07-20 DIAGNOSIS — Z885 Allergy status to narcotic agent status: Secondary | ICD-10-CM

## 2021-07-20 DIAGNOSIS — Z888 Allergy status to other drugs, medicaments and biological substances status: Secondary | ICD-10-CM

## 2021-07-20 DIAGNOSIS — I5022 Chronic systolic (congestive) heart failure: Secondary | ICD-10-CM | POA: Diagnosis present

## 2021-07-20 DIAGNOSIS — D649 Anemia, unspecified: Secondary | ICD-10-CM | POA: Diagnosis present

## 2021-07-20 DIAGNOSIS — Z794 Long term (current) use of insulin: Secondary | ICD-10-CM

## 2021-07-20 DIAGNOSIS — Z87442 Personal history of urinary calculi: Secondary | ICD-10-CM

## 2021-07-20 DIAGNOSIS — F209 Schizophrenia, unspecified: Secondary | ICD-10-CM | POA: Diagnosis present

## 2021-07-20 DIAGNOSIS — E860 Dehydration: Secondary | ICD-10-CM | POA: Diagnosis present

## 2021-07-20 DIAGNOSIS — I70229 Atherosclerosis of native arteries of extremities with rest pain, unspecified extremity: Secondary | ICD-10-CM

## 2021-07-20 DIAGNOSIS — F17210 Nicotine dependence, cigarettes, uncomplicated: Secondary | ICD-10-CM | POA: Diagnosis present

## 2021-07-20 DIAGNOSIS — N179 Acute kidney failure, unspecified: Principal | ICD-10-CM | POA: Diagnosis present

## 2021-07-20 DIAGNOSIS — I739 Peripheral vascular disease, unspecified: Secondary | ICD-10-CM | POA: Diagnosis present

## 2021-07-20 DIAGNOSIS — Z7902 Long term (current) use of antithrombotics/antiplatelets: Secondary | ICD-10-CM

## 2021-07-20 DIAGNOSIS — D631 Anemia in chronic kidney disease: Secondary | ICD-10-CM | POA: Diagnosis present

## 2021-07-20 DIAGNOSIS — N189 Chronic kidney disease, unspecified: Secondary | ICD-10-CM | POA: Diagnosis present

## 2021-07-20 DIAGNOSIS — Z91030 Bee allergy status: Secondary | ICD-10-CM

## 2021-07-20 DIAGNOSIS — S78112A Complete traumatic amputation at level between left hip and knee, initial encounter: Secondary | ICD-10-CM

## 2021-07-20 DIAGNOSIS — F4310 Post-traumatic stress disorder, unspecified: Secondary | ICD-10-CM | POA: Diagnosis present

## 2021-07-20 DIAGNOSIS — E1152 Type 2 diabetes mellitus with diabetic peripheral angiopathy with gangrene: Secondary | ICD-10-CM | POA: Diagnosis present

## 2021-07-20 DIAGNOSIS — N281 Cyst of kidney, acquired: Secondary | ICD-10-CM | POA: Diagnosis not present

## 2021-07-20 DIAGNOSIS — N1832 Chronic kidney disease, stage 3b: Secondary | ICD-10-CM | POA: Diagnosis present

## 2021-07-20 DIAGNOSIS — Z89612 Acquired absence of left leg above knee: Secondary | ICD-10-CM

## 2021-07-20 DIAGNOSIS — F0390 Unspecified dementia without behavioral disturbance: Secondary | ICD-10-CM | POA: Diagnosis present

## 2021-07-20 DIAGNOSIS — I480 Paroxysmal atrial fibrillation: Secondary | ICD-10-CM | POA: Diagnosis present

## 2021-07-20 DIAGNOSIS — Z79899 Other long term (current) drug therapy: Secondary | ICD-10-CM

## 2021-07-20 DIAGNOSIS — I96 Gangrene, not elsewhere classified: Secondary | ICD-10-CM | POA: Diagnosis present

## 2021-07-20 LAB — POCT I-STAT, CHEM 8
BUN: 86 mg/dL — ABNORMAL HIGH (ref 8–23)
Calcium, Ion: 1.19 mmol/L (ref 1.15–1.40)
Chloride: 96 mmol/L — ABNORMAL LOW (ref 98–111)
Creatinine, Ser: 3.5 mg/dL — ABNORMAL HIGH (ref 0.61–1.24)
Glucose, Bld: 235 mg/dL — ABNORMAL HIGH (ref 70–99)
HCT: 20 % — ABNORMAL LOW (ref 39.0–52.0)
Hemoglobin: 6.8 g/dL — CL (ref 13.0–17.0)
Potassium: 4.6 mmol/L (ref 3.5–5.1)
Sodium: 135 mmol/L (ref 135–145)
TCO2: 31 mmol/L (ref 22–32)

## 2021-07-20 LAB — CBG MONITORING, ED: Glucose-Capillary: 216 mg/dL — ABNORMAL HIGH (ref 70–99)

## 2021-07-20 LAB — CBC
HCT: 20.8 % — ABNORMAL LOW (ref 39.0–52.0)
Hemoglobin: 6.9 g/dL — CL (ref 13.0–17.0)
MCH: 32.5 pg (ref 26.0–34.0)
MCHC: 33.2 g/dL (ref 30.0–36.0)
MCV: 98.1 fL (ref 80.0–100.0)
Platelets: 260 10*3/uL (ref 150–400)
RBC: 2.12 MIL/uL — ABNORMAL LOW (ref 4.22–5.81)
RDW: 13.9 % (ref 11.5–15.5)
WBC: 7.1 10*3/uL (ref 4.0–10.5)
nRBC: 0 % (ref 0.0–0.2)

## 2021-07-20 LAB — IRON AND TIBC
Iron: 39 ug/dL — ABNORMAL LOW (ref 45–182)
Iron: 104 ug/dL (ref 45–182)
Saturation Ratios: 16 % — ABNORMAL LOW (ref 17.9–39.5)
Saturation Ratios: 45 % — ABNORMAL HIGH (ref 17.9–39.5)
TIBC: 248 ug/dL — ABNORMAL LOW (ref 250–450)
TIBC: 231 ug/dL — ABNORMAL LOW (ref 250–450)
UIBC: 209 ug/dL
UIBC: 127 ug/dL

## 2021-07-20 LAB — GLUCOSE, CAPILLARY
Glucose-Capillary: 259 mg/dL — ABNORMAL HIGH (ref 70–99)
Glucose-Capillary: 171 mg/dL — ABNORMAL HIGH (ref 70–99)

## 2021-07-20 LAB — TSH: TSH: 0.139 u[IU]/mL — ABNORMAL LOW (ref 0.350–4.500)

## 2021-07-20 LAB — FOLATE: Folate: >40 ng/mL (ref >5.9)

## 2021-07-20 LAB — RETICULOCYTES
Immature Retic Fract: 8.2 % (ref 2.3–15.9)
RBC.: 1.78 MIL/uL — ABNORMAL LOW (ref 4.22–5.81)
Retic Count, Absolute: 26 10*3/uL (ref 19.0–186.0)
Retic Ct Pct: 1.5 % (ref 0.4–3.1)

## 2021-07-20 LAB — HEMOGLOBIN: Hemoglobin: 5.8 g/dL — CL (ref 13.0–17.0)

## 2021-07-20 LAB — PROTIME-INR
INR: 1.2 (ref 0.8–1.2)
Prothrombin Time: 14.6 seconds (ref 11.4–15.2)

## 2021-07-20 LAB — PREPARE RBC (CROSSMATCH)

## 2021-07-20 LAB — VITAMIN B12: Vitamin B-12: 641 pg/mL (ref 180–914)

## 2021-07-20 LAB — FERRITIN: Ferritin: 252 ng/mL (ref 24–336)

## 2021-07-20 SURGERY — ABDOMINAL AORTOGRAM W/LOWER EXTREMITY
Anesthesia: LOCAL

## 2021-07-20 MED ORDER — INSULIN GLARGINE-YFGN 100 UNIT/ML ~~LOC~~ SOLN
8.0000 [IU] | Freq: Every day | SUBCUTANEOUS | Status: DC
  Administered 2021-07-20 – 2021-07-24 (×5): 8 [IU] via SUBCUTANEOUS
  Filled 2021-07-20 (×6): qty 0.08

## 2021-07-20 MED ORDER — DONEPEZIL HCL 10 MG PO TABS
10.0000 mg | ORAL_TABLET | Freq: Every day | ORAL | Status: DC
Start: 2021-07-20 — End: 2021-07-25
  Administered 2021-07-20 – 2021-07-24 (×5): 10 mg via ORAL
  Filled 2021-07-20 (×6): qty 1

## 2021-07-20 MED ORDER — METOPROLOL SUCCINATE ER 50 MG PO TB24
50.0000 mg | ORAL_TABLET | Freq: Every day | ORAL | Status: DC
  Administered 2021-07-21 – 2021-07-24 (×4): 50 mg via ORAL
  Filled 2021-07-20 (×5): qty 1

## 2021-07-20 MED ORDER — ACETAMINOPHEN 325 MG PO TABS
650.0000 mg | ORAL_TABLET | Freq: Four times a day (QID) | ORAL | Status: DC | PRN
  Administered 2021-07-21: 650 mg via ORAL
  Filled 2021-07-20: qty 2

## 2021-07-20 MED ORDER — ADULT MULTIVITAMIN W/MINERALS CH
1.0000 | ORAL_TABLET | Freq: Every day | ORAL | Status: DC
  Administered 2021-07-21 – 2021-07-24 (×4): 1 via ORAL
  Filled 2021-07-20 (×5): qty 1

## 2021-07-20 MED ORDER — RISPERIDONE 0.5 MG PO TABS
0.5000 mg | ORAL_TABLET | Freq: Two times a day (BID) | ORAL | Status: DC | PRN
  Administered 2021-07-22 – 2021-07-24 (×3): 0.5 mg via ORAL
  Filled 2021-07-20 (×7): qty 1

## 2021-07-20 MED ORDER — ACETAMINOPHEN 500 MG PO TABS: 500.0000 mg | ORAL_TABLET | Freq: Two times a day (BID) | ORAL | Status: DC | PRN

## 2021-07-20 MED ORDER — FERROUS SULFATE 325 (65 FE) MG PO TABS
325.0000 mg | ORAL_TABLET | Freq: Every day | ORAL | Status: DC
  Administered 2021-07-21 – 2021-07-24 (×4): 325 mg via ORAL
  Filled 2021-07-20 (×5): qty 1

## 2021-07-20 MED ORDER — GABAPENTIN 100 MG PO CAPS
100.0000 mg | ORAL_CAPSULE | Freq: Two times a day (BID) | ORAL | Status: DC
  Administered 2021-07-20 – 2021-07-24 (×9): 100 mg via ORAL
  Filled 2021-07-20 (×10): qty 1

## 2021-07-20 MED ORDER — TRAMADOL HCL 50 MG PO TABS
50.0000 mg | ORAL_TABLET | Freq: Four times a day (QID) | ORAL | Status: DC | PRN
  Administered 2021-07-20 – 2021-07-22 (×4): 50 mg via ORAL
  Filled 2021-07-20 (×5): qty 1

## 2021-07-20 MED ORDER — CLOPIDOGREL BISULFATE 75 MG PO TABS
75.0000 mg | ORAL_TABLET | Freq: Every day | ORAL | Status: DC
  Administered 2021-07-21 – 2021-07-24 (×4): 75 mg via ORAL
  Filled 2021-07-20 (×5): qty 1

## 2021-07-20 MED ORDER — ALBUTEROL SULFATE (2.5 MG/3ML) 0.083% IN NEBU: 2.5000 mg | INHALATION_SOLUTION | RESPIRATORY_TRACT | Status: DC | PRN

## 2021-07-20 MED ORDER — ACETAMINOPHEN 650 MG RE SUPP: 650.0000 mg | Freq: Four times a day (QID) | RECTAL | Status: DC | PRN

## 2021-07-20 MED ORDER — ONDANSETRON HCL 4 MG PO TABS: 4.0000 mg | ORAL_TABLET | Freq: Four times a day (QID) | ORAL | Status: DC | PRN

## 2021-07-20 MED ORDER — ONDANSETRON HCL 4 MG/2ML IJ SOLN: 4.0000 mg | Freq: Four times a day (QID) | INTRAMUSCULAR | Status: DC | PRN

## 2021-07-20 MED ORDER — ASPIRIN 81 MG PO TBEC
81.0000 mg | DELAYED_RELEASE_TABLET | Freq: Every day | ORAL | Status: DC
  Administered 2021-07-21 – 2021-07-24 (×4): 81 mg via ORAL
  Filled 2021-07-20 (×5): qty 1

## 2021-07-20 MED ORDER — ENSURE COMPLETE PO LIQD: 237.0000 mL | Freq: Two times a day (BID) | ORAL | Status: DC

## 2021-07-20 MED ORDER — ONDANSETRON HCL 4 MG PO TABS
4.0000 mg | ORAL_TABLET | Freq: Four times a day (QID) | ORAL | Status: DC | PRN
  Administered 2021-07-22: 4 mg via ORAL
  Filled 2021-07-20: qty 1

## 2021-07-20 MED ORDER — SODIUM CHLORIDE 0.9 % IV SOLN
10.0000 mL/h | Freq: Once | INTRAVENOUS | Status: AC
  Administered 2021-07-20: 10 mL/h via INTRAVENOUS

## 2021-07-20 MED ORDER — PANTOPRAZOLE SODIUM 40 MG PO TBEC
40.0000 mg | DELAYED_RELEASE_TABLET | Freq: Every day | ORAL | Status: DC
  Administered 2021-07-21 – 2021-07-24 (×4): 40 mg via ORAL
  Filled 2021-07-20 (×5): qty 1

## 2021-07-20 MED ORDER — SODIUM CHLORIDE 0.9 % IV SOLN: INTRAVENOUS | Status: DC

## 2021-07-20 MED ORDER — ATORVASTATIN CALCIUM 40 MG PO TABS
40.0000 mg | ORAL_TABLET | Freq: Every day | ORAL | Status: DC
  Administered 2021-07-21 – 2021-07-24 (×4): 40 mg via ORAL
  Filled 2021-07-20 (×4): qty 1

## 2021-07-20 NOTE — Inpatient Diabetes Management (Signed)
Inpatient Diabetes Program Recommendations  AACE/ADA: New Consensus Statement on Inpatient Glycemic Control (2015)  Target Ranges:  Prepandial:   less than 140 mg/dL      Peak postprandial:   less than 180 mg/dL (1-2 hours)      Critically ill patients:  140 - 180 mg/dL   Lab Results  Component Value Date   GLUCAP 259 (H) 07/20/2021   HGBA1C 6.0 (H) 07/04/2021    Review of Glycemic Control  Latest Reference Range & Units 07/20/21 12:01  Glucose-Capillary 70 - 99 mg/dL 259 (H)  (H): Data is abnormally high Diabetes history: Type 2 DM Outpatient Diabetes medications: Lantus 8 units QHS Current orders for Inpatient glycemic control: None  Inpatient Diabetes Program Recommendations:    Consider adding Novolog 0-6 units TID & HS.   Thanks, Bronson Curb, MSN, RNC-OB Diabetes Coordinator [phone] (8a-5p)

## 2021-07-20 NOTE — H&P (Signed)
History and Physical    Allen Yu BZJ:[id number] DOB: [birth year] DOA: 07/20/2021  PCP: [name], MD  Patient coming from: vascular suite  I have personally briefly reviewed patient's old medical records in [place]  Chief Complaint: abnormal labs                                 Low hgb, elevated Cr  HPI: Allen Yu is a 76 y.o. male with medical history significant of   chronic systolic heart failure, diabetes with nephropathy, chronic kidney disease stage IIIb, gastroesophageal reflux disease, paroxysmal atrial fibrillation, schizophrenia, hyperlipidemia, peripheral vascular disease (status post left AKA); who is referred to ED from vascular suite after pre-op labs right leg angiogram noted  hemoglobin of 6.8 with prior 7.8 , and creatinine of 3.8 up from baseline around 1.8.   Patient currently has no acute complaints and notes he feels at his baseline state of health. He notes no episodes of bleeding or dark stools. He denies n/v/d/dysuria/ abdominal pain / sob or chest pain.  ED Course:  Vitals, afeb, bp 129/56, hr 65, rr 17  sat 99% on ra  EKG: Renal u/s: NAD Labs: Retic 1.78,inr 1.2 Tx: prbc x 1 Review of Systems: As per HPI otherwise 10 point review of systems negative.   Past Medical History:  Diagnosis Date   Anemia    Arthritis    Carotid stenosis    Chronic back pain    Chronic HFrEF (heart failure with reduced ejection fraction) ([place])    a. 09/2020 Echo: EF 45%, mild LVH; b. 12/2020 Echo: EF 25-30%, glob HK, mod LVH, mildly reduced RV fxn, RVSP 65.61mHg, mod BAE. Triv effusion. Mod MR/TR.   CKD (chronic kidney disease), stage III ([place]    Constipation    Dementia ([place]    Diabetes mellitus    Dilated cardiomyopathy ([place]    a. 09/2020 Echo: EF 45%; b. 12/2020 Echo: EF 25-30%.   Foot drop, right    GERD (gastroesophageal reflux disease)    History of kidney stones    Hypertension    Lung nodule    PAF (paroxysmal atrial fibrillation) (H[facility])     Peripheral neuropathy    Peripheral vascular disease ([facility]    a. 06/2020 s/p L AKA; b. 10/2020 s/p R SFA and above-knee popliteal PTA.   PTSD (post-traumatic stress disorder)    Schizophrenia ([place]    Tuberculosis    Treated    Past Surgical History:  Procedure Laterality Date   ABDOMINAL AORTOGRAM W/LOWER EXTREMITY N/A 05/20/2020   Procedure: ABDOMINAL AORTOGRAM W/LOWER EXTREMITY;  Surgeon: FElam Dutch MD;  [facility];  Service: Cardiovascular;  Laterality: N/A;   ABDOMINAL AORTOGRAM W/LOWER EXTREMITY N/A 10/27/2020   Procedure: ABDOMINAL AORTOGRAM W/LOWER EXTREMITY;  Surgeon: CMarty Heck MD;  [facility];  Service: Cardiovascular;  Laterality: N/A;   AMPUTATION Left 06/28/2020   Procedure: AMPUTATION ABOVE KNEE LEFT;  Surgeon: ERosetta Posner MD;  [facility]  Service: Vascular;  Laterality: Left;   [place] 2000, 2013   x2   BLADDER SURGERY     02   CATARACT EXTRACTION W/PHACO Right 05/12/2021   Procedure: CATARACT EXTRACTION PHACO AND INTRAOCULAR LENS PLACEMENT ([place];  Surgeon: WBaruch Goldmann MD;  Location: [facility];  Service: Ophthalmology;  Laterality: Right;  CDE: 27.28   CATARACT EXTRACTION  W/PHACO Left 05/29/2021   Procedure: CATARACT EXTRACTION PHACO AND INTRAOCULAR LENS PLACEMENT ([facility]);  Surgeon: Baruch Goldmann, MD;  Location: [facility];  Service: Ophthalmology;  Laterality: Left;  CDE  12.96   CHOLECYSTECTOMY     COLONOSCOPY     COLONOSCOPY N/A 12/07/2014   Procedure: COLONOSCOPY;  Surgeon: Daneil Dolin, MD;  [facility];  Service: Endoscopy;  Laterality: N/A;  10:30 Am   EYE SURGERY Bilateral    removed metal from eye   FEMORAL-TIBIAL BYPASS GRAFT Left 05/27/2020   Procedure: LEFT FEMORAL TO PERONEAL ARTERY BYPASS;  Surgeon: Rosetta Posner, MD;  Location: [facility];  Service: Vascular;  Laterality: Left;   HERNIA REPAIR Right    INGUINAL HERNIA REPAIR Left 11/03/2013   Procedure: HERNIA REPAIR  INGUINAL ADULT;  Surgeon: Gayland Curry, MD;  Location: [facility];  Service: General;  Laterality: Left;   PERIPHERAL VASCULAR INTERVENTION  10/27/2020   Procedure: PERIPHERAL VASCULAR INTERVENTION;  Surgeon: Marty Heck, MD;  Location: [facility];  Service: Cardiovascular;;   SHOULDER SURGERY     RIGHT SHOULDER    VIDEO BRONCHOSCOPY WITH ENDOBRONCHIAL NAVIGATION N/A 07/10/2019   Procedure: VIDEO BRONCHOSCOPY WITH ENDOBRONCHIAL NAVIGATION;  Surgeon: Melrose Nakayama, MD;  Location: [facility];  Service: Thoracic;  Laterality: N/A;     reports that he has been smoking cigarettes. He has a 22.50 pack-year smoking history. He has never used smokeless tobacco. He reports current drug use. Drug: Marijuana. He reports that he does not drink alcohol.  Allergies  Allergen Reactions   Bee Venom Anaphylaxis   Codeine Other (See Comments)    incoherent  Other reaction(s): Delirium   Propoxyphene Other (See Comments)    Dizziness, "Makes me feel drunk" Other reaction(s): Dizziness   Valsartan Other (See Comments)    incoherent Other reaction(s): Delirium    Family History  Problem Relation Age of Onset   Heart disease Mother        before age 70    Prior to Admission medications   Medication Sig Start Date End Date Taking? Authorizing Provider  acetaminophen (TYLENOL) 500 MG tablet Take 500 mg by mouth 2 (two) times daily as needed for moderate pain. 03/07/21  Yes [provider]  aspirin EC 81 MG tablet Take 1 tablet (81 mg total) by mouth daily with breakfast. 07/07/21 07/07/22 Yes Emokpae, Courage, MD  atorvastatin (LIPITOR) 40 MG tablet Take 1 tablet (40 mg total) by mouth at bedtime. 07/14/21 10/12/21 Yes Strader, Fransisco Hertz, PA-C  clopidogrel (PLAVIX) 75 MG tablet Take 1 tablet (75 mg total) by mouth daily. 07/07/21 07/07/22 Yes Emokpae, Courage, MD  donepezil (ARICEPT) 10 MG tablet Take 10 mg by mouth at bedtime.   Yes [provider]  feeding supplement, ENSURE  COMPLETE, (ENSURE COMPLETE) LIQD Take 237 mLs by mouth 2 (two) times daily between meals. 01/19/21  Yes Roxan Hockey, MD  ferrous sulfate 325 (65 FE) MG tablet Take 325 mg by mouth daily with breakfast. 03/07/21  Yes [provider]  gabapentin (NEURONTIN) 100 MG capsule Take 1 capsule (100 mg total) by mouth 2 (two) times daily. 07/07/21  Yes Emokpae, Courage, MD  LANTUS 100 UNIT/ML injection Inject 0.08 mLs (8 Units total) into the skin at bedtime. 07/07/21  Yes Emokpae, Courage, MD  losartan (COZAAR) 50 MG tablet Take 1 tablet (50 mg total) by mouth daily. 07/07/21 07/02/22 Yes Emokpae, Courage, MD  metoprolol succinate (TOPROL-XL) 50 MG 24 hr tablet Take 1  tablet (50 mg total) by mouth daily. Take with or immediately following a meal. 06/26/21 06/21/22 Yes Strader, [place] M, PA-C  Multiple Vitamins-Minerals (MULTIVITAMIN WITH MINERALS) tablet Take 1 tablet by mouth daily. 01/19/21 01/19/22 Yes Emokpae, Courage, MD  omeprazole (PRILOSEC) 20 MG capsule Take 1 capsule by mouth daily. 03/23/21  Yes [provider]  risperiDONE (RISPERDAL) 0.5 MG tablet Take 1 tablet (0.5 mg total) by mouth every 12 (twelve) hours as needed (Restlessness and agitation). 01/19/21  Yes Emokpae, Courage, MD  torsemide 40 MG TABS Take 40 mg by mouth every morning. 07/07/21  Yes Emokpae, Courage, MD  traMADol (ULTRAM) 50 MG tablet Take 50 mg by mouth every 6 (six) hours as needed for moderate pain. 07/18/20  Yes [provider]  Alcohol Swabs (ALCOHOL PADS) 70 % PADS USE 1 PAD AS DIRECTED 10/12/20 10/13/21  [provider]  B-D UF III MINI PEN NEEDLES 31G X 5 MM MISC SMARTSIG:1 Each SUB-Q Daily 12/23/19   [provider]  ondansetron (ZOFRAN) 4 MG tablet Take 1 tablet (4 mg total) by mouth every 6 (six) hours as needed for nausea. 01/19/21   Roxan Hockey, MD    Physical Exam: Vitals:   07/20/21 1158 07/20/21 1330  BP: (!) 136/115 (!) 129/56  Pulse: 68 65  Resp: 16 17  Temp: (!)  97.5 F (36.4 C) 97.8 F (36.6 C)  TempSrc: Oral   SpO2: 97% 99%  Weight: 49.9 kg   Height: '6\' 1"'$  (1.854 m)     Constitutional: NAD, calm, comfortable Vitals:   07/20/21 1158 07/20/21 1330  BP: (!) 136/115 (!) 129/56  Pulse: 68 65  Resp: 16 17  Temp: (!) 97.5 F (36.4 C) 97.8 F (36.6 C)  TempSrc: Oral   SpO2: 97% 99%  Weight: 49.9 kg   Height: '6\' 1"'$  (1.854 m)   Constitutional: NAD, calm, comfortable Eyes: PERRL, lids and conjunctivae normal ENMT: Mucous membranes are moist. Posterior pharynx clear of any exudate or lesions.Normal dentition.  Neck: normal, supple, no masses, no thyromegaly Respiratory: clear to auscultation bilaterally, no wheezing, no crackles. Normal respiratory effort. No accessory muscle use.  Cardiovascular: Regular rate and rhythm, no murmurs / rubs / gallops. No extremity edema. 2+ pedal pulses. No carotid bruits.  Abdomen: no tenderness, no masses palpated. No hepatosplenomegaly. Bowel sounds positive.  Musculoskeletal: no clubbing / s/p left Aka, right ischemic limb noted forefoot warm to touch , noted ischemic great toe and 2nd toe  with dry gangrene Skin: changes as mentioned  Neurologic: CN 2-12 grossly intact. Sensation intact, DTR normal. Strength 5/5 in all 4.  Psychiatric: Normal judgment and insight. Alert and oriented x 3. Normal mood.    Labs on Admission: I have personally reviewed following labs and imaging studies  CBC: Recent Labs  Lab 07/20/21 1209 07/20/21 1218  WBC  --  7.1  HGB 6.8* 6.9*  HCT 20.0* 20.8*  MCV  --  98.1  PLT  --  468   Basic Metabolic Panel: Recent Labs  Lab 07/20/21 1209  NA 135  K 4.6  CL 96*  GLUCOSE 235*  BUN 86*  CREATININE 3.50*   GFR: Estimated Creatinine Clearance: 12.9 mL/min (A) (by C-G formula based on SCr of 3.5 mg/dL (H)). Liver Function Tests: No results for input(s): "AST", "ALT", "ALKPHOS", "BILITOT", "PROT", "ALBUMIN" in the last 168 hours. No results for input(s): "LIPASE",  "AMYLASE" in the last 168 hours. No results for input(s): "AMMONIA" in the last 168 hours. Coagulation Profile:  Recent Labs  Lab 07/20/21 1508  INR 1.2   Cardiac Enzymes: No results for input(s): "CKTOTAL", "CKMB", "CKMBINDEX", "TROPONINI" in the last 168 hours. BNP (last 3 results) No results for input(s): "PROBNP" in the last 8760 hours. HbA1C: No results for input(s): "HGBA1C" in the last 72 hours. CBG: Recent Labs  Lab 07/20/21 1201  GLUCAP 259*   Lipid Profile: No results for input(s): "CHOL", "HDL", "LDLCALC", "TRIG", "CHOLHDL", "LDLDIRECT" in the last 72 hours. Thyroid Function Tests: No results for input(s): "TSH", "T4TOTAL", "FREET4", "T3FREE", "THYROIDAB" in the last 72 hours. Anemia Panel: Recent Labs    07/20/21 1508  RETICCTPCT 1.5   Urine analysis:    Component Value Date/Time   COLORURINE AMBER (A) 04/10/2021 1233   APPEARANCEUR CLOUDY (A) 04/10/2021 1233   LABSPEC 1.015 04/10/2021 1233   PHURINE 8.0 04/10/2021 1233   GLUCOSEU NEGATIVE 04/10/2021 1233   HGBUR MODERATE (A) 04/10/2021 1233   BILIRUBINUR NEGATIVE 04/10/2021 1233   KETONESUR NEGATIVE 04/10/2021 1233   PROTEINUR >=300 (A) 04/10/2021 1233   UROBILINOGEN 0.2 02/18/2014 0950   NITRITE NEGATIVE 04/10/2021 1233   LEUKOCYTESUR MODERATE (A) 04/10/2021 1233    Radiological Exams on Admission: US RENAL  Result Date: 07/20/2021 CLINICAL DATA:  Acute kidney injury. EXAM: RENAL / URINARY TRACT ULTRASOUND COMPLETE COMPARISON:  CT abdomen pelvis dated April 11, 2021. FINDINGS: Right Kidney: Renal measurements: 10.6 x 6.5 x 6.4 cm = volume: 233 mL. Echogenicity within normal limits. No mass or hydronephrosis visualized. Left Kidney: Renal measurements: 9.3 x 5.8 x 5.0 cm = volume: 141 mL. Echogenicity within normal limits. No mass or hydronephrosis visualized. 1.8 cm simple cyst in the upper pole. No follow-up imaging is recommended. Bladder: Decompressed by Foley catheter. Other: None. IMPRESSION: 1. No  acute abnormality. Electronically Signed   By: Titus Dubin M.D.   On: 07/20/2021 14:51    EKG: Independently reviewed.   Assessment/Plan   Normocytic Anemia - thought to be related to ACD /renal disease  -less than 7 , transfused PRBC x 1 in ED -no signs of bleeding  - will monitor h/h  -check iron panel/FOB to be complete  - check EPO levels as well   AKI on CKDIIIb -possible related to overdiuresis - hold nephrotoxic medications  -optimize blood pressures/anemia - repeat labs  -gently hydration as tolerated  -strict I/o - Renal U/S unremarkable  -UA pending  - if no improvement consider renal consult   Peripheral vascular disease (status post left AKA) -right limb ischemia  -plan on hold for further intervention , - awaiting optimization of anemia and renal function  -resume secondary ppx with plavix if h/h stable   Chronic systolic heart failure Ef 25% -does not appear to be overloaded -continue toprol -resume ARB /diuretic as renal function allows    Diabetes with nephropathy -resume long acting lantus 8 units qhs - poc glucose  , sliding scale    GERD -ppi  Paroxysmal atrial fibrillation -continue metoprolol -not on anticoagulation    Schizophrenia -resume home regimen    Hyperlipidemia -continue statin     DVT prophylaxis:  n/a - heparin sq once h/h stable  Code Status: full Family Communication Disposition Plan: patient  expected to be admitted greater than 2 midnights  Consults called: n/a Admission status: inpatient   Clance Boll MD [facility]   If 7PM-7AM, please contact night-coverage [email or web address] Password [facility]  07/20/2021, 4:46 PM

## 2021-07-20 NOTE — Plan of Care (Signed)
  Problem: Education: Goal: Knowledge of General Education information will improve Description: Including pain rating scale, medication(s)/side effects and non-pharmacologic comfort measures Outcome: Not Progressing   Problem: Health Behavior/Discharge Planning: Goal: Ability to manage health-related needs will improve Outcome: Not Progressing   Problem: Clinical Measurements: Goal: Ability to maintain clinical measurements within normal limits will improve Outcome: Not Progressing Goal: Will remain free from infection Outcome: Not Progressing Goal: Diagnostic test results will improve Outcome: Not Progressing Goal: Respiratory complications will improve Outcome: Not Progressing Goal: Cardiovascular complication will be avoided Outcome: Not Progressing   Problem: Coping: Goal: Level of anxiety will decrease Outcome: Not Progressing   Problem: Elimination: Goal: Will not experience complications related to bowel motility Outcome: Not Progressing Goal: Will not experience complications related to urinary retention Outcome: Not Progressing   Problem: Pain Managment: Goal: General experience of comfort will improve Outcome: Not Progressing

## 2021-07-20 NOTE — ED Provider Notes (Signed)
Emergency Department Provider Note   I have reviewed the triage vital signs and the nursing notes.   HISTORY  Chief Complaint low hemoglobin   HPI Allen Yu is a 76 y.o. male with PMH reviewed including PAD, CKD, CHF, DM, and PAF presents to the emergency department for evaluation of abnormal lab work in the preoperative setting.  Patient was due to have angiogram of his right leg with vascular surgery this morning.  Blood work at the time of arrival showed hemoglobin of 6.8 which was repeated and unchanged.  Also found to have acute kidney injury with creatinine greater than 3.  Patient tells me he feels "fine."  Neither he nor family have noticed any blood in the urine or bowel movements.  No fevers.  He is not having abdominal pain.  He has Allen Yu history of anemia related to his renal insufficiency and has received blood transfusions in the past.    Past Medical History:  Diagnosis Date   Anemia    Arthritis    Carotid stenosis    Chronic back pain    Chronic HFrEF (heart failure with reduced ejection fraction) ([place])    a. 09/2020 Echo: EF 45%, mild LVH; b. 12/2020 Echo: EF 25-30%, glob HK, mod LVH, mildly reduced RV fxn, RVSP 65.59mHg, mod BAE. Triv effusion. Mod MR/TR.   CKD (chronic kidney disease), stage III ([place]    Constipation    Dementia ([place]    Diabetes mellitus    Dilated cardiomyopathy ([place]    a. 09/2020 Echo: EF 45%; b. 12/2020 Echo: EF 25-30%.   Foot drop, right    GERD (gastroesophageal reflux disease)    History of kidney stones    Hypertension    Lung nodule    PAF (paroxysmal atrial fibrillation) (HCC)    Peripheral neuropathy    Peripheral vascular disease ([place]    a. 06/2020 s/p L AKA; b. 10/2020 s/p R SFA and above-knee popliteal PTA.   PTSD (post-traumatic stress disorder)    Schizophrenia ([place]    Tuberculosis    Treated    Review of Systems  Constitutional: No fever/chills Eyes: No visual changes. ENT: No sore throat. Cardiovascular:  Denies chest pain. Respiratory: Denies shortness of breath. Gastrointestinal: No abdominal pain.  No nausea, no vomiting.  No diarrhea.  No constipation. Genitourinary: Negative for dysuria. Musculoskeletal: Negative for back pain. Positive pain in the right leg (chronic).  Skin: Negative for rash. Neurological: Negative for headaches, focal weakness or numbness.   ____________________________________________   PHYSICAL EXAM:  VITAL SIGNS: ED Triage Vitals [07/20/21 1158]  Enc Vitals Group     BP (!) 136/115     Pulse Rate 68     Resp 16     Temp (!) 97.5 F (36.4 C)     Temp Source Oral     SpO2 97 %     Weight 110 lb (49.9 kg)     Height '6\' 1"'$  (1.854 m)   Constitutional: Alert and oriented. Well appearing and in no acute distress. Eyes: Conjunctivae are normal.  Head: Atraumatic. Nose: No congestion/rhinnorhea. Mouth/Throat: Mucous membranes are moist.  Neck: No stridor.  Cardiovascular: Normal rate, regular rhythm. Grossly normal heart sounds.   Respiratory: Normal respiratory effort.  No retractions. Lungs CTAB. Gastrointestinal: Soft and nontender. No distention.  Musculoskeletal: RLE with overlying skin changes without cellulitis.  Neurologic:  Normal speech and language. No gross focal neurologic deficits are appreciated.  Skin:  Skin is warm, dry and intact.  No rash noted.  ____________________________________________   LABS (all labs ordered are listed, but only abnormal results are displayed)  Labs Reviewed  GLUCOSE, CAPILLARY - Abnormal; Notable for the following components:      Result Value   Glucose-Capillary 259 (*)    All other components within normal limits  CBC - Abnormal; Notable for the following components:   RBC 2.12 (*)    Hemoglobin 6.9 (*)    HCT 20.8 (*)    All other components within normal limits  IRON AND TIBC - Abnormal; Notable for the following components:   Iron 39 (*)    TIBC 248 (*)    Saturation Ratios 16 (*)    All other  components within normal limits  RETICULOCYTES - Abnormal; Notable for the following components:   RBC. 1.78 (*)    All other components within normal limits  HEMOGLOBIN - Abnormal; Notable for the following components:   Hemoglobin 5.8 (*)    All other components within normal limits  TSH - Abnormal; Notable for the following components:   TSH 0.139 (*)    All other components within normal limits  GLUCOSE, CAPILLARY - Abnormal; Notable for the following components:   Glucose-Capillary 171 (*)    All other components within normal limits  IRON AND TIBC - Abnormal; Notable for the following components:   TIBC 231 (*)    Saturation Ratios 45 (*)    All other components within normal limits  POCT I-STAT, CHEM 8 - Abnormal; Notable for the following components:   Chloride 96 (*)    BUN 86 (*)    Creatinine, Ser 3.50 (*)    Glucose, Bld 235 (*)    Hemoglobin 6.8 (*)    HCT 20.0 (*)    All other components within normal limits  CBG MONITORING, ED - Abnormal; Notable for the following components:   Glucose-Capillary 216 (*)    All other components within normal limits  SURGICAL PCR SCREEN  PROTIME-INR  VITAMIN B12  FOLATE  FERRITIN  CBC  COMPREHENSIVE METABOLIC PANEL  PROTIME-INR  URINALYSIS, ROUTINE W REFLEX MICROSCOPIC  HEMOGLOBIN  ERYTHROPOIETIN  HEMOGLOBIN  TYPE AND SCREEN  PREPARE RBC (CROSSMATCH)   ____________________________________________  EKG   EKG Interpretation  Date/Time:  Thursday July 20 2021 13:30:58 EDT Ventricular Rate:  65 PR Interval:  164 QRS Duration: 92 QT Interval:  409 QTC Calculation: 426 R Axis:   9 Text Interpretation: Sinus rhythm Atrial premature complex Abnormal R-wave progression, early transition Borderline T wave abnormalities Confirmed by Nanda Quinton [phone]) on 07/20/2021 1:42:55 PM        ____________________________________________  RADIOLOGY  US RENAL  Result Date: 07/20/2021 CLINICAL DATA:  Acute kidney injury.  EXAM: RENAL / URINARY TRACT ULTRASOUND COMPLETE COMPARISON:  CT abdomen pelvis dated April 11, 2021. FINDINGS: Right Kidney: Renal measurements: 10.6 x 6.5 x 6.4 cm = volume: 233 mL. Echogenicity within normal limits. No mass or hydronephrosis visualized. Left Kidney: Renal measurements: 9.3 x 5.8 x 5.0 cm = volume: 141 mL. Echogenicity within normal limits. No mass or hydronephrosis visualized. 1.8 cm simple cyst in the upper pole. No follow-up imaging is recommended. Bladder: Decompressed by Foley catheter. Other: None. IMPRESSION: 1. No acute abnormality. Electronically Signed   By: Titus Dubin M.D.   On: 07/20/2021 14:51    ____________________________________________   PROCEDURES  Procedure(s) performed:   Procedures  CRITICAL CARE Performed by: Margette Fast Total critical care time: 35 minutes Critical care time was exclusive  of separately billable procedures and treating other patients. Critical care was necessary to treat or prevent imminent or life-threatening deterioration. Critical care was time spent personally by me on the following activities: development of treatment plan with patient and/or surrogate as well as nursing, discussions with consultants, evaluation of patient's response to treatment, examination of patient, obtaining history from patient or surrogate, ordering and performing treatments and interventions, ordering and review of laboratory studies, ordering and review of radiographic studies, pulse oximetry and re-evaluation of patient's condition.  Nanda Quinton, MD Emergency Medicine  ____________________________________________   INITIAL IMPRESSION / ASSESSMENT AND PLAN / ED COURSE  Pertinent labs & imaging results that were available during my care of the patient were reviewed by me and considered in my medical decision making (see chart for details).   This patient is Presenting for Evaluation of weakness, which does require a range of treatment options,  and is a complaint that involves a high risk of morbidity and mortality.  The Differential Diagnoses include symptomatic anemia, blood loss anemia, dehydration, CHF exacerbation, AKI.  Critical Interventions-    Medications  aspirin EC tablet 81 mg (has no administration in time range)  traMADol (ULTRAM) tablet 50 mg (50 mg Oral Given 07/21/21 0545)  atorvastatin (LIPITOR) tablet 40 mg (has no administration in time range)  metoprolol succinate (TOPROL-XL) 24 hr tablet 50 mg (has no administration in time range)  donepezil (ARICEPT) tablet 10 mg (10 mg Oral Given 07/20/21 2108)  risperiDONE (RISPERDAL) tablet 0.5 mg (has no administration in time range)  insulin glargine-yfgn (SEMGLEE) injection 8 Units (8 Units Subcutaneous Given 07/20/21 2112)  pantoprazole (PROTONIX) EC tablet 40 mg (has no administration in time range)  ondansetron (ZOFRAN) tablet 4 mg (has no administration in time range)  clopidogrel (PLAVIX) tablet 75 mg (has no administration in time range)  ferrous sulfate tablet 325 mg (has no administration in time range)  gabapentin (NEURONTIN) capsule 100 mg (100 mg Oral Given 07/20/21 2108)  multivitamin with minerals tablet 1 tablet (has no administration in time range)  acetaminophen (TYLENOL) tablet 650 mg (has no administration in time range)    Or  acetaminophen (TYLENOL) suppository 650 mg (has no administration in time range)  ondansetron (ZOFRAN) tablet 4 mg (has no administration in time range)    Or  ondansetron (ZOFRAN) injection 4 mg (has no administration in time range)  albuterol (PROVENTIL) (2.5 MG/3ML) 0.083% nebulizer solution 2.5 mg (has no administration in time range)  0.9 %  sodium chloride infusion (0 mL/hr Intravenous Stopped 07/20/21 1942)    Reassessment after intervention: Symptoms remain unchanged.    I did obtain Additional Historical Information from family at bedside.  I decided to review pertinent External Data, and in summary labs and pre-op  note from vascular surgery reviewed.   Clinical Laboratory Tests Ordered, included hemoglobin of 6.9.  Creatinine of 3.5 elevated from 1.8 on the 16th. No leukocytosis.   Radiologic Tests: Considered chest x-ray but patient without shortness of breath or evidence of acute volume overload.  No hypoxemia.  Cardiac Monitor Tracing which shows NSR.   Social Determinants of Health Risk patient with a smoking history.   Consult complete with Hospitalist. Plan for admit.   Medical Decision Making: Summary:  Patient arrives to the emergency department with abnormal labs found during the preoperative evaluation.  He is anemic to 6.9.  Downtrending hemoglobin over the recent past and history of anemia related to chronic kidney disease.  He does have acute kidney injury  by labs here.  He received IV fluids as of rate but not bolus.  He does not appear acutely volume overloaded. Agrees with plan for admit.   Reevaluation with update and discussion with patient and family. Plan for admit. Agrees to PRBC transfusion.   Disposition: admit  ____________________________________________  FINAL CLINICAL IMPRESSION(S) / ED DIAGNOSES  Final diagnoses:  AKI (acute kidney injury) ([place])  Anemia due to chronic kidney disease, unspecified CKD stage    Note:  This document was prepared using Dragon voice recognition software and may include unintentional dictation errors.  Nanda Quinton, MD, [facility] Emergency Medicine    Tyrese Capriotti, [facility], MD 07/21/21 [phone]

## 2021-07-20 NOTE — ED Notes (Signed)
Pt being verbally abusive to nurse while in room. Educated pt that that behavior will not be tolerated and that food has to be cleared by the D. Ppt stated "fuck you". Nurse walked out of room

## 2021-07-20 NOTE — ED Notes (Signed)
5N called to change purple man to green at this time in order for patient to be transported to room.

## 2021-07-20 NOTE — Progress Notes (Signed)
76 year old male scheduled for right leg angiogram with me today for critical limb ischemia with tissue loss.  This has been ongoing since April.  His labs today indicate a hemoglobin of 6.8.  He also appears to have acute on chronic renal failure with a creatinine of 3.8 (baseline around 1.8).  His toes are dry with no signs of infection.  I will cancel his procedure and have updated his wife.  I will send him to the ED for further evaluation and once he is optimized we can reevaluate timing of his angiogram.  Discussed he would likely require some limited contrast to evaluate tibial runoff as we cannot do this with CO2.  In his current state would be high risk for further renal failure requiring dialysis.     Marty Heck, MD [facility] Office: [place]

## 2021-07-20 NOTE — Progress Notes (Signed)
CRITICAL RESULT PROVIDER NOTIFICATION  Test performed and critical result:  Hgb 6.9  Date and time result received:  6/29 1300  Provider name/title: Carlis Abbott, MD  Date and time provider notified: 6/29 1300  Date and time provider responded: 6/29 1305  Provider response:En route

## 2021-07-20 NOTE — ED Notes (Signed)
Per [name] at bedside, requests for patient not to have ordered risperidone given due to making patient more altered and combative.

## 2021-07-20 NOTE — ED Notes (Signed)
Blood consent obtained for patient in regards to blood transfusion. Wife at bedside and advocated to sign for patient due to patient have history of dementia. All risks and benefits explained and patient and wife both verbalized understanding. This RN as witness.

## 2021-07-20 NOTE — Progress Notes (Signed)
Called report to ER charge nurse to inform them Allen Yu would be coming down per MD request.

## 2021-07-20 NOTE — ED Triage Notes (Signed)
Pt brought down to ED from short stay. Pt was scheduled for heart catheterization and pt's Hgb came back at 6.9 and Creatinine at 3.5. MD sent pt down to ED for further eval. VSS, aox4

## 2021-07-21 ENCOUNTER — Ambulatory Visit (HOSPITAL_COMMUNITY): Payer: TRICARE For Life (TFL)

## 2021-07-21 DIAGNOSIS — N189 Chronic kidney disease, unspecified: Secondary | ICD-10-CM | POA: Diagnosis not present

## 2021-07-21 DIAGNOSIS — I4891 Unspecified atrial fibrillation: Secondary | ICD-10-CM | POA: Diagnosis not present

## 2021-07-21 DIAGNOSIS — E860 Dehydration: Secondary | ICD-10-CM | POA: Diagnosis present

## 2021-07-21 DIAGNOSIS — N179 Acute kidney failure, unspecified: Principal | ICD-10-CM

## 2021-07-21 DIAGNOSIS — E785 Hyperlipidemia, unspecified: Secondary | ICD-10-CM | POA: Diagnosis present

## 2021-07-21 DIAGNOSIS — I739 Peripheral vascular disease, unspecified: Secondary | ICD-10-CM

## 2021-07-21 DIAGNOSIS — F17210 Nicotine dependence, cigarettes, uncomplicated: Secondary | ICD-10-CM | POA: Diagnosis present

## 2021-07-21 DIAGNOSIS — Z7982 Long term (current) use of aspirin: Secondary | ICD-10-CM | POA: Diagnosis not present

## 2021-07-21 DIAGNOSIS — S78112A Complete traumatic amputation at level between left hip and knee, initial encounter: Secondary | ICD-10-CM | POA: Diagnosis not present

## 2021-07-21 DIAGNOSIS — Z87442 Personal history of urinary calculi: Secondary | ICD-10-CM | POA: Diagnosis not present

## 2021-07-21 DIAGNOSIS — I509 Heart failure, unspecified: Secondary | ICD-10-CM | POA: Diagnosis not present

## 2021-07-21 DIAGNOSIS — F209 Schizophrenia, unspecified: Secondary | ICD-10-CM

## 2021-07-21 DIAGNOSIS — I480 Paroxysmal atrial fibrillation: Secondary | ICD-10-CM | POA: Diagnosis present

## 2021-07-21 DIAGNOSIS — E1122 Type 2 diabetes mellitus with diabetic chronic kidney disease: Secondary | ICD-10-CM | POA: Diagnosis present

## 2021-07-21 DIAGNOSIS — F0390 Unspecified dementia without behavioral disturbance: Secondary | ICD-10-CM | POA: Diagnosis present

## 2021-07-21 DIAGNOSIS — E1152 Type 2 diabetes mellitus with diabetic peripheral angiopathy with gangrene: Secondary | ICD-10-CM | POA: Diagnosis present

## 2021-07-21 DIAGNOSIS — I5022 Chronic systolic (congestive) heart failure: Secondary | ICD-10-CM

## 2021-07-21 DIAGNOSIS — E1142 Type 2 diabetes mellitus with diabetic polyneuropathy: Secondary | ICD-10-CM | POA: Diagnosis present

## 2021-07-21 DIAGNOSIS — I96 Gangrene, not elsewhere classified: Secondary | ICD-10-CM | POA: Diagnosis present

## 2021-07-21 DIAGNOSIS — K219 Gastro-esophageal reflux disease without esophagitis: Secondary | ICD-10-CM | POA: Diagnosis present

## 2021-07-21 DIAGNOSIS — Z885 Allergy status to narcotic agent status: Secondary | ICD-10-CM | POA: Diagnosis not present

## 2021-07-21 DIAGNOSIS — Z91030 Bee allergy status: Secondary | ICD-10-CM | POA: Diagnosis not present

## 2021-07-21 DIAGNOSIS — T502X5A Adverse effect of carbonic-anhydrase inhibitors, benzothiadiazides and other diuretics, initial encounter: Secondary | ICD-10-CM | POA: Diagnosis present

## 2021-07-21 DIAGNOSIS — N1832 Chronic kidney disease, stage 3b: Secondary | ICD-10-CM

## 2021-07-21 DIAGNOSIS — D631 Anemia in chronic kidney disease: Secondary | ICD-10-CM | POA: Diagnosis present

## 2021-07-21 DIAGNOSIS — D649 Anemia, unspecified: Secondary | ICD-10-CM | POA: Diagnosis present

## 2021-07-21 DIAGNOSIS — F4310 Post-traumatic stress disorder, unspecified: Secondary | ICD-10-CM | POA: Diagnosis present

## 2021-07-21 DIAGNOSIS — Z888 Allergy status to other drugs, medicaments and biological substances status: Secondary | ICD-10-CM | POA: Diagnosis not present

## 2021-07-21 DIAGNOSIS — I130 Hypertensive heart and chronic kidney disease with heart failure and stage 1 through stage 4 chronic kidney disease, or unspecified chronic kidney disease: Secondary | ICD-10-CM | POA: Diagnosis present

## 2021-07-21 DIAGNOSIS — Z5329 Procedure and treatment not carried out because of patient's decision for other reasons: Secondary | ICD-10-CM | POA: Diagnosis not present

## 2021-07-21 DIAGNOSIS — E1129 Type 2 diabetes mellitus with other diabetic kidney complication: Secondary | ICD-10-CM | POA: Diagnosis not present

## 2021-07-21 LAB — COMPREHENSIVE METABOLIC PANEL
ALT: 15 U/L (ref 0–44)
AST: 17 U/L (ref 15–41)
Albumin: 3.1 g/dL — ABNORMAL LOW (ref 3.5–5.0)
Alkaline Phosphatase: 82 U/L (ref 38–126)
Anion gap: 8 (ref 5–15)
BUN: 57 mg/dL — ABNORMAL HIGH (ref 8–23)
CO2: 31 mmol/L (ref 22–32)
Calcium: 9.4 mg/dL (ref 8.9–10.3)
Chloride: 101 mmol/L (ref 98–111)
Creatinine, Ser: 2.54 mg/dL — ABNORMAL HIGH (ref 0.61–1.24)
GFR, Estimated: 26 mL/min — ABNORMAL LOW (ref >60)
Glucose, Bld: 97 mg/dL (ref 70–99)
Potassium: 4.4 mmol/L (ref 3.5–5.1)
Sodium: 140 mmol/L (ref 135–145)
Total Bilirubin: 1 mg/dL (ref 0.3–1.2)
Total Protein: 6.6 g/dL (ref 6.5–8.1)

## 2021-07-21 LAB — RENAL FUNCTION PANEL
Albumin: 3.1 g/dL — ABNORMAL LOW (ref 3.5–5.0)
Anion gap: 10 (ref 5–15)
BUN: 57 mg/dL — ABNORMAL HIGH (ref 8–23)
CO2: 29 mmol/L (ref 22–32)
Calcium: 9.4 mg/dL (ref 8.9–10.3)
Chloride: 101 mmol/L (ref 98–111)
Creatinine, Ser: 2.51 mg/dL — ABNORMAL HIGH (ref 0.61–1.24)
GFR, Estimated: 26 mL/min — ABNORMAL LOW (ref >60)
Glucose, Bld: 95 mg/dL (ref 70–99)
Phosphorus: 3.8 mg/dL (ref 2.5–4.6)
Potassium: 4.4 mmol/L (ref 3.5–5.1)
Sodium: 140 mmol/L (ref 135–145)

## 2021-07-21 LAB — GLUCOSE, CAPILLARY
Glucose-Capillary: 110 mg/dL — ABNORMAL HIGH (ref 70–99)
Glucose-Capillary: 106 mg/dL — ABNORMAL HIGH (ref 70–99)
Glucose-Capillary: 166 mg/dL — ABNORMAL HIGH (ref 70–99)
Glucose-Capillary: 188 mg/dL — ABNORMAL HIGH (ref 70–99)

## 2021-07-21 LAB — TYPE AND SCREEN
ABO/RH(D): O POS
Antibody Screen: NEGATIVE
Unit division: 0

## 2021-07-21 LAB — PROTIME-INR
INR: 1 (ref 0.8–1.2)
Prothrombin Time: 13.5 seconds (ref 11.4–15.2)

## 2021-07-21 LAB — CBC
HCT: 30 % — ABNORMAL LOW (ref 39.0–52.0)
Hemoglobin: 10.6 g/dL — ABNORMAL LOW (ref 13.0–17.0)
MCH: 32.8 pg (ref 26.0–34.0)
MCHC: 35.3 g/dL (ref 30.0–36.0)
MCV: 92.9 fL (ref 80.0–100.0)
Platelets: 299 10*3/uL (ref 150–400)
RBC: 3.23 MIL/uL — ABNORMAL LOW (ref 4.22–5.81)
RDW: 15.5 % (ref 11.5–15.5)
WBC: 7.7 10*3/uL (ref 4.0–10.5)
nRBC: 0 % (ref 0.0–0.2)

## 2021-07-21 LAB — BPAM RBC
Blood Product Expiration Date: 202307222359
ISSUE DATE / TIME: 202306291749
Unit Type and Rh: 5100

## 2021-07-21 LAB — HEMOGLOBIN: Hemoglobin: 10.2 g/dL — ABNORMAL LOW (ref 13.0–17.0)

## 2021-07-21 MED ORDER — LACTATED RINGERS IV SOLN: INTRAVENOUS | Status: AC

## 2021-07-21 NOTE — Consult Note (Signed)
Nephrology Consult   Requesting provider: Flora Lipps Service requesting consult: Hospitalist Reason for consult: AKI on CKD3b   Assessment/Recommendations: Allen Yu is a/an 76 y.o. male with a past medical history CHF, DM 2, CKD 3B, GERD, atrial fibrillation, schizophrenia, HLD, PVD who present w/ anemia and AKI   Non-Oliguric AKI on CKD3b: likely 2/2 dehydration and anemia. Significantly improved today. -LR 100cc/hr for 10 hours; monitor volume status closely -hold home diuretics and losartan -Continue to monitor daily Cr, Dose meds for GFR -Monitor Daily I/Os, Daily weight  -Maintain MAP>65 for optimal renal perfusion.  -Avoid nephrotoxic medications including NSAIDs -Use synthetic opioids (Fentanyl/Dilaudid) if needed -Likely sign off tomorrow if still improving  CHF: appears slightly dry. Hydration as above. Hold diuretics  Anemia: mutlifactorial. Transfusions per primary team  RLE gangrene/PVD: vascular following. Medically optimizing before procedure  DM2: mgmt per primary   Recommendations conveyed to primary service.    [facility] 07/21/2021 11:17 AM   _____________________________________________________________________________________ CC: AKI, anemia  History of Present Illness: Allen Yu is a/an 76 y.o. male with a past medical history of CHF, DM 2, CKD 3B, GERD, atrial fibrillation, schizophrenia, HLD, PVD who presents with anemia.  Patient presented to the hospital yesterday to undergo procedure for his right lower extremity.  Perioperative labs demonstrated hemoglobin of 6.8 and creatinine of 3.8.  Baseline creatinine around 1.7-1.9.  The patient was sent to the emergency department for admission.  He overall appeared well without complaints.  He was given transfusion.  Renal ultrasound was reassuring.  Today the patient has a hard time interacting.  States that he is having pain in his right leg.  Frustrated  that he has to be here.  Unable to tell me any other significant details.  Specifically denies nausea, vomiting, shortness of breath.  Fattening has improved to 2.5 today.   Medications:  Current Facility-Administered Medications  Medication Dose Route Frequency Provider Last Rate Last Admin   acetaminophen (TYLENOL) tablet 650 mg  650 mg Oral Q6H PRN Clance Boll, MD       Or   acetaminophen (TYLENOL) suppository 650 mg  650 mg Rectal Q6H PRN Clance Boll, MD       albuterol (PROVENTIL) (2.5 MG/3ML) 0.083% nebulizer solution 2.5 mg  2.5 mg Nebulization Q2H PRN Clance Boll, MD       aspirin EC tablet 81 mg  81 mg Oral Q breakfast Myles Rosenthal A, MD   81 mg at 07/21/21 1013   atorvastatin (LIPITOR) tablet 40 mg  40 mg Oral QHS Myles Rosenthal A, MD       clopidogrel (PLAVIX) tablet 75 mg  75 mg Oral Daily Myles Rosenthal A, MD   75 mg at 07/21/21 1013   donepezil (ARICEPT) tablet 10 mg  10 mg Oral QHS Myles Rosenthal A, MD   10 mg at 07/20/21 2108   ferrous sulfate tablet 325 mg  325 mg Oral Q breakfast Myles Rosenthal A, MD   325 mg at 07/21/21 1013   gabapentin (NEURONTIN) capsule 100 mg  100 mg Oral BID Myles Rosenthal A, MD   100 mg at 07/21/21 1013   insulin glargine-yfgn (SEMGLEE) injection 8 Units  8 Units Subcutaneous QHS Clance Boll, MD   8 Units at 07/20/21 2112   metoprolol succinate (TOPROL-XL) 24 hr tablet 50 mg  50 mg Oral Daily Myles Rosenthal A, MD   50 mg at 07/21/21 1013   multivitamin with minerals  tablet 1 tablet  1 tablet Oral Daily Myles Rosenthal A, MD   1 tablet at 07/21/21 1013   ondansetron (ZOFRAN) tablet 4 mg  4 mg Oral Q6H PRN Clance Boll, MD       Or   ondansetron [facility]) injection 4 mg  4 mg Intravenous Q6H PRN Clance Boll, MD       ondansetron [facility]) tablet 4 mg  4 mg Oral Q6H PRN Clance Boll, MD       pantoprazole (PROTONIX) EC tablet 40 mg  40 mg Oral Daily Myles Rosenthal A, MD   40  mg at 07/21/21 1013   risperiDONE (RISPERDAL) tablet 0.5 mg  0.5 mg Oral Q12H PRN Clance Boll, MD       traMADol Veatrice [facility]bon) tablet 50 mg  50 mg Oral Q6H PRN Clance Boll, MD   50 mg at 07/21/21 0545     ALLERGIES Bee venom, Codeine, Propoxyphene, and Valsartan  MEDICAL HISTORY Past Medical History:  Diagnosis Date   Anemia    Arthritis    Carotid stenosis    Chronic back pain    Chronic HFrEF (heart failure with reduced ejection fraction) ([place])    a. 09/2020 Echo: EF 45%, mild LVH; b. 12/2020 Echo: EF 25-30%, glob HK, mod LVH, mildly reduced RV fxn, RVSP 65.11mHg, mod BAE. Triv effusion. Mod MR/TR.   CKD (chronic kidney disease), stage III ([place]    Constipation    Dementia ([place]    Diabetes mellitus    Dilated cardiomyopathy ([place]    a. 09/2020 Echo: EF 45%; b. 12/2020 Echo: EF 25-30%.   Foot drop, right    GERD (gastroesophageal reflux disease)    History of kidney stones    Hypertension    Lung nodule    PAF (paroxysmal atrial fibrillation) (HCC)    Peripheral neuropathy    Peripheral vascular disease ([place]    a. 06/2020 s/p L AKA; b. 10/2020 s/p R SFA and above-knee popliteal PTA.   PTSD (post-traumatic stress disorder)    Schizophrenia ([place]    Tuberculosis    Treated     SOCIAL HISTORY Social History   Socioeconomic History   Marital status: Married    Spouse name: SIvin Booty  Number of children: Not on file   Years of education: Not on file   Highest education level: Not on file  Occupational History   Not on file  Tobacco Use   Smoking status: Every Day    Packs/day: 0.50    Years: 45.00    Total pack years: 22.50    Types: Cigarettes   Smokeless tobacco: Never   Tobacco comments:    burns them up  Vaping Use   Vaping Use: Never used  Substance and Sexual Activity   Alcohol use: No    Alcohol/week: 0.0 standard drinks of alcohol   Drug use: Yes    Types: Marijuana    Comment: almost daily for pain   Sexual activity: Never  Other Topics  Concern   Not on file  Social History Narrative   Not on file   Social Determinants of Health   Financial Resource Strain: Low Risk  (03/13/2021)   Overall Financial Resource Strain (CARDIA)    Difficulty of Paying Living Expenses: Not hard at all  Food Insecurity: No Food Insecurity (04/21/2021)   Hunger Vital Sign    Worried About Running Out of Food in the Last Year: Never true    Ran Out of  Food in the Last Year: Never true  Transportation Needs: No Transportation Needs (04/21/2021)   PRAPARE - Hydrologist (Medical): No    Lack of Transportation (Non-Medical): No  Physical Activity: Not on file  Stress: No Stress Concern Present (03/13/2021)   [place]    Feeling of Stress : Only a little  Social Connections: Socially Integrated (03/13/2021)   Social Connection and Isolation Panel [NHANES]    Frequency of Communication with Friends and Family: Twice a week    Frequency of Social Gatherings with Friends and Family: Twice a week    Attends Religious Services: 1 to 4 times per year    Active Member of [employer] or Organizations: Yes    Attends Archivist Meetings: 1 to 4 times per year    Marital Status: Married  Human resources officer Violence: Not At Risk (03/13/2021)   Humiliation, Afraid, Rape, and Kick questionnaire    Fear of Current or Ex-Partner: No    Emotionally Abused: No    Physically Abused: No    Sexually Abused: No     FAMILY HISTORY Family History  Problem Relation Age of Onset   Heart disease Mother        before age 14      Review of Systems: 12 systems reviewed Otherwise as per HPI, all other systems reviewed and negative  Physical Exam: Vitals:   07/21/21 0426 07/21/21 0739  BP: (!) 142/67 129/87  Pulse: 64 65  Resp: 13 18  Temp: 98 F (36.7 C) 97.6 F (36.4 C)  SpO2: 96% 100%   No intake/output data recorded.  Intake/Output Summary (Last 24  hours) at 07/21/2021 1117 Last data filed at 07/21/2021 0426 Gross per 24 hour  Intake 384.58 ml  Output 1500 ml  Net -1115.42 ml   General: Chronically ill-appearing, lying in bed, no distress HEENT: anicteric sclera, oropharynx clear without lesions CV: Normal rate, no audible murmurs Lungs: clear to auscultation bilaterally, normal work of breathing Abd: soft, non-tender, non-distended Skin: Skin breakdown on right lower extremity.  Otherwise no visible lesions or rashes Psych: alert, engaged, appropriate mood and affect Musculoskeletal: Left lower extremity absent, right lower extremity with significant muscle wasting Neuro: Limited speech, moves right lower extremity and upper extremities spontaneously  Test Results Reviewed Lab Results  Component Value Date   NA 140 07/21/2021   NA 140 07/21/2021   K 4.4 07/21/2021   K 4.4 07/21/2021   CL 101 07/21/2021   CL 101 07/21/2021   CO2 31 07/21/2021   CO2 29 07/21/2021   BUN 57 (H) 07/21/2021   BUN 57 (H) 07/21/2021   CREATININE 2.54 (H) 07/21/2021   CREATININE 2.51 (H) 07/21/2021   CALCIUM 9.4 07/21/2021   CALCIUM 9.4 07/21/2021   ALBUMIN 3.1 (L) 07/21/2021   ALBUMIN 3.1 (L) 07/21/2021   PHOS 3.8 07/21/2021    CBC Recent Labs  Lab 07/20/21 1209 07/20/21 1218 07/20/21 1508  WBC  --  7.1  --   HGB 6.8* 6.9* 5.8*  HCT 20.0* 20.8*  --   MCV  --  98.1  --   PLT  --  260  --     I have reviewed all relevant outside healthcare records related to the patient's current hospitalization

## 2021-07-21 NOTE — Progress Notes (Addendum)
  Progress Note    07/21/2021 9:07 AM Hospital Day 1  Subjective:  says "do whatever y'all gonna do".  Says he is still having pain in the right leg.    afebrile  Vitals:   07/21/21 0426 07/21/21 0739  BP: (!) 142/67 129/87  Pulse: 64 65  Resp: 13 18  Temp: 98 F (36.7 C) 97.6 F (36.4 C)  SpO2: 96% 100%    Physical Exam: General:  resting comfortably Lungs:  non labored Extremities:  gangrene of right foot; appears stable from picture taken yesterday  CBC    Component Value Date/Time   WBC 7.1 07/20/2021 1218   RBC 1.78 (L) 07/20/2021 1508   RBC 2.12 (L) 07/20/2021 1218   HGB 5.8 (LL) 07/20/2021 1508   HCT 20.8 (L) 07/20/2021 1218   HCT 31.3 (L) 07/09/2015 0757   PLT 260 07/20/2021 1218   MCV 98.1 07/20/2021 1218   MCH 32.5 07/20/2021 1218   MCHC 33.2 07/20/2021 1218   RDW 13.9 07/20/2021 1218   LYMPHSABS 2.9 07/04/2021 1058   MONOABS 0.6 07/04/2021 1058   EOSABS 0.2 07/04/2021 1058   BASOSABS 0.1 07/04/2021 1058    BMET    Component Value Date/Time   NA 135 07/20/2021 1209   K 4.6 07/20/2021 1209   CL 96 (L) 07/20/2021 1209   CO2 28 07/07/2021 0859   GLUCOSE 235 (H) 07/20/2021 1209   BUN 86 (H) 07/20/2021 1209   CREATININE 3.50 (H) 07/20/2021 1209   CREATININE 1.10 11/26/2019 0934   CALCIUM 8.8 (L) 07/07/2021 0859   GFRNONAA 37 (L) 07/07/2021 0859   GFRAA >60 07/10/2019 0702    INR    Component Value Date/Time   INR 1.2 07/20/2021 1508     Intake/Output Summary (Last 24 hours) at 07/21/2021 0907 Last data filed at 07/21/2021 0426 Gross per 24 hour  Intake 384.58 ml  Output 1500 ml  Net -1115.42 ml     Assessment/Plan:  76 y.o. male with RLE CLI who's angiogram was cancelled yesterday due to AKI and anemia Hospital Day 1  -pt CLI appears stable from yesterday -AKI on CKD-nephrology following -labs for today are pending.   -he did receive one unit PRBC yesterday -will reschedule angiogram once other medical issues are sorted out.     Leontine Locket, PA-C [facility] [phone] 07/21/2021 9:07 AM   I have seen and evaluated the patient. I agree with the PA note as documented above.  Patient with dry gangrene of the right lower extremity as previously documented.  He was scheduled for arteriogram yesterday but sent to the ER due to profound anemia with a hemoglobin of 6 as well as acute on chronic renal failure.  Nephrology is now following.  He did get unit of blood in the ED.  I have put him back on the schedule for next Wednesday for lower extremity angiogram with Dr. Virl Cagey.  I think it would be okay if he  gets optimized and is appropriate for discharge and can come back for his procedure.  Otherwise we will follow his progress through the weekend.  Marty Heck, MD [facility] [place] Office: [phone]

## 2021-07-21 NOTE — Hospital Course (Addendum)
Allen Yu is a 76 y.o. 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  In the ED, vitals were stable.  Renal ultrasound did not show any hydronephrosis.  Patient was transfused 1 unit of packed RBC and was admitted hospital for further evaluation and treatment.    Assessment and plan Principal Problem:   Anemia Active Problems:   Acute kidney injury superimposed on chronic kidney disease (HCC)   Schizophrenia, unspecified (HCC)   Chronic systolic CHF (congestive heart failure) (HCC)   Unspecified atrial fibrillation (HCC)   Peripheral vascular disease (HCC)   Above knee amputation of left lower extremity (HCC)   Normocytic Anemia Likely secondary to progressive kidney disease, anemia of chronic disease.  No mention of acute blood loss.  Patient has received 1 unit of packed RBC during hospitalization.  Latest hemoglobin of 9.0. Serum iron 104 with low TIBC.  Stool occult blood pending  AKI on CKDIIIb Could be secondary to overdiuresis.  Continue to hold diuretics for now.  Renal ultrasound was unremarkable.  Nephrology followed the patient during hospitalization.  Latest creatinine level of 1.9 today which is likely at his baseline.  Baseline creatinine ranged between 1.7-1.9.  Nephrology has signed off at this time.  Peripheral vascular disease (status post left AKA) With right limb ischemia and gangrene.  Vascular surgery with tentative plan for angiogram on 07/26/2021. creatinine at baseline at this time.   Chronic systolic heart failure ejection fraction of 25%. Appears compensated at this time.  Continue beta-blocker.  We will continue to hold  ARB for now.  On low-dose aspirin statins Plavix.  Received gentle IV fluid during hospitalization.    Diabetes with nephropathy Continue sliding scale insulin and Lantus.  Diabetic diet.  POC glucose of 125   GERD Continue PPI   Paroxysmal atrial fibrillation -continue metoprolol, not on anticoagulation.    Schizophrenia/dementia, PTSD On the risperidone.   Hyperlipidemia Continue Lipitor.

## 2021-07-21 NOTE — Progress Notes (Signed)
PROGRESS NOTE    Allen Yu  DJM:[id number] DOB: Nov 11, 1945 DOA: 07/20/2021 PCP: [name], MD    Brief Narrative:  Allen Yu is a 76 y.o. 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  In the ED vitals were stable.  Renal ultrasound did not show any hydronephrosis.  Patient was transfused 1 unit of packed RBC and was admitted hospital for further evaluation and treatment.    Assessment and plan Principal Problem:   Anemia Active Problems:   Acute kidney injury superimposed on chronic kidney disease (HCC)   Schizophrenia, unspecified (HCC)   Chronic systolic CHF (congestive heart failure) (HCC)   Unspecified atrial fibrillation (HCC)   Peripheral vascular disease (HCC)   Above knee amputation of left lower extremity (HCC)   Normocytic Anemia Likely secondary to progressive kidney disease anemia of chronic disease.  Denies blood loss.  Transfused 1 unit of packed RBC.  Serum iron 104 with low TIBC.  CBC repeat pending after transfusion of packed RBC.  AKI on CKDIIIb Could be secondary to overdiuresis.  Hold diuretics for now.  Renal ultrasound was unremarkable.  Nephrology has been consulted in view of possible need of angiogram IV contrast in in preparation for possible surgery.   Peripheral vascular disease (status post left AKA) With the right limb ischemia And gangrene.  Awaiting for medical optimization.  Might need angiogram and vascular intervention   Chronic systolic heart failure ejection fraction of 25%. Appears compensated at this time.  Continue beta-blocker.  Hold ARB for now.  On low-dose aspirin statins Plavix.     Diabetes with nephropathy Continue sliding scale insulin and Lantus.  Diabetic diet   GERD PPI.   Paroxysmal atrial fibrillation -continue metoprolol, not on anticoagulation.    Schizophrenia On the risperidone.   Hyperlipidemia Continue Lipitor.      DVT prophylaxis:   Aspirin, Plavix   Code Status:     Code Status: Full Code  Disposition: Unknown at this time,  Status is: Observation  The patient will require care spanning > 2 midnights and should be moved to inpatient because: Acute kidney injury, severe anemia, possible need for vascular intervention.   Family Communication:  Communicated with the patient's wife on the phone and updated her about the clinical condition of the patient.  Consultants:  Vascular surgery Nephrology  Procedures:  PRBC transfusion I unit  Antimicrobials:  None  Anti-infectives (From admission, onward)    None      Subjective: Today, patient was seen and examined at bedside.  Complains of mild foot pain.  Inquiring about surgical intervention.  Appears to be upset at times.  Objective: Vitals:   07/20/21 2020 07/20/21 2030 07/21/21 0426 07/21/21 0739  BP: (!) 151/72  (!) 142/67 129/87  Pulse: 61 60 64 65  Resp: (!) '8 10 13 18  '$ Temp:   98 F (36.7 C) 97.6 F (36.4 C)  TempSrc:   Oral   SpO2: 100% 97% 96% 100%  Weight:      Height:        Intake/Output Summary (Last 24 hours) at 07/21/2021 1008 Last data filed at 07/21/2021 0426 Gross per 24 hour  Intake 384.58 ml  Output 1500 ml  Net -1115.42 ml  Filed Weights   07/20/21 1158  Weight: 49.9 kg    Physical Examination: Body mass index is 14.51 kg/m.   General: Thinly built built, not in obvious distress, elderly male, not in obvious distress, HENT:   No scleral pallor or icterus noted. Oral mucosa is moist.  Chest:  Clear breath sounds.  Diminished breath sounds bilaterally. No crackles or wheezes.  CVS: S1 &S2 heard. No murmur.  Regular rate and  rhythm. Abdomen: Soft, nontender, nondistended.  Bowel sounds are heard.   Extremities: No cyanosis, clubbing or edema.  Peripheral pulses are palpable.  Left above-knee amputation.  Right foot with ischemic discoloration. Psych: Alert, awake and oriented, normal mood CNS:  No cranial nerve deficits.  Power equal in all extremities.   Skin: Right lower extremity ischemic foot with great toe and second toe gangrene.  Left above-knee amputation  Data Reviewed:   CBC: Recent Labs  Lab 07/20/21 1209 07/20/21 1218 07/20/21 1508  WBC  --  7.1  --   HGB 6.8* 6.9* 5.8*  HCT 20.0* 20.8*  --   MCV  --  98.1  --   PLT  --  260  --     Basic Metabolic Panel: Recent Labs  Lab 07/20/21 1209  NA 135  K 4.6  CL 96*  GLUCOSE 235*  BUN 86*  CREATININE 3.50*    Liver Function Tests: No results for input(s): "AST", "ALT", "ALKPHOS", "BILITOT", "PROT", "ALBUMIN" in the last 168 hours.   Radiology Studies: US RENAL  Result Date: 07/20/2021 CLINICAL DATA:  Acute kidney injury. EXAM: RENAL / URINARY TRACT ULTRASOUND COMPLETE COMPARISON:  CT abdomen pelvis dated April 11, 2021. FINDINGS: Right Kidney: Renal measurements: 10.6 x 6.5 x 6.4 cm = volume: 233 mL. Echogenicity within normal limits. No mass or hydronephrosis visualized. Left Kidney: Renal measurements: 9.3 x 5.8 x 5.0 cm = volume: 141 mL. Echogenicity within normal limits. No mass or hydronephrosis visualized. 1.8 cm simple cyst in the upper pole. No follow-up imaging is recommended. Bladder: Decompressed by Foley catheter. Other: None. IMPRESSION: 1. No acute abnormality. Electronically Signed   By: Titus Dubin M.D.   On: 07/20/2021 14:51      LOS: 0 days    Flora Lipps, MD [facility] Available via Epic secure chat 7am-7pm After these hours, please refer to coverage provider listed on [email or web address] 07/21/2021, 10:08 AM

## 2021-07-22 DIAGNOSIS — N179 Acute kidney failure, unspecified: Secondary | ICD-10-CM | POA: Diagnosis not present

## 2021-07-22 DIAGNOSIS — I5022 Chronic systolic (congestive) heart failure: Secondary | ICD-10-CM | POA: Diagnosis not present

## 2021-07-22 DIAGNOSIS — S78112A Complete traumatic amputation at level between left hip and knee, initial encounter: Secondary | ICD-10-CM | POA: Diagnosis not present

## 2021-07-22 DIAGNOSIS — N1832 Chronic kidney disease, stage 3b: Secondary | ICD-10-CM | POA: Diagnosis not present

## 2021-07-22 LAB — RENAL FUNCTION PANEL
Albumin: 2.9 g/dL — ABNORMAL LOW (ref 3.5–5.0)
Anion gap: 8 (ref 5–15)
BUN: 44 mg/dL — ABNORMAL HIGH (ref 8–23)
CO2: 28 mmol/L (ref 22–32)
Calcium: 9.2 mg/dL (ref 8.9–10.3)
Chloride: 101 mmol/L (ref 98–111)
Creatinine, Ser: 2.08 mg/dL — ABNORMAL HIGH (ref 0.61–1.24)
GFR, Estimated: 33 mL/min — ABNORMAL LOW (ref >60)
Glucose, Bld: 104 mg/dL — ABNORMAL HIGH (ref 70–99)
Phosphorus: 3.3 mg/dL (ref 2.5–4.6)
Potassium: 4.4 mmol/L (ref 3.5–5.1)
Sodium: 137 mmol/L (ref 135–145)

## 2021-07-22 LAB — GLUCOSE, CAPILLARY
Glucose-Capillary: 115 mg/dL — ABNORMAL HIGH (ref 70–99)
Glucose-Capillary: 120 mg/dL — ABNORMAL HIGH (ref 70–99)
Glucose-Capillary: 125 mg/dL — ABNORMAL HIGH (ref 70–99)
Glucose-Capillary: 119 mg/dL — ABNORMAL HIGH (ref 70–99)
Glucose-Capillary: 206 mg/dL — ABNORMAL HIGH (ref 70–99)

## 2021-07-22 LAB — ERYTHROPOIETIN: Erythropoietin: 17.7 m[IU]/mL (ref 2.6–18.5)

## 2021-07-22 MED ORDER — LACTATED RINGERS IV SOLN: INTRAVENOUS | Status: AC

## 2021-07-22 MED ORDER — CHLORHEXIDINE GLUCONATE CLOTH 2 % EX PADS
6.0000 | MEDICATED_PAD | Freq: Every day | CUTANEOUS | Status: DC
  Administered 2021-07-22 – 2021-07-24 (×3): 6 via TOPICAL

## 2021-07-22 NOTE — Progress Notes (Addendum)
PROGRESS NOTE    Allen Yu  HER:[id number] DOB: [birth year] DOA: 07/20/2021 PCP: [name], MD    Brief Narrative:  Allen Yu is a 76 y.o. 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  In the ED vitals were stable.  Renal ultrasound did not show any hydronephrosis.  Patient was transfused 1 unit of packed RBC and was admitted hospital for further evaluation and treatment.    Assessment and plan Principal Problem:   Anemia Active Problems:   Acute kidney injury superimposed on chronic kidney disease (HCC)   Schizophrenia, unspecified (HCC)   Chronic systolic CHF (congestive heart failure) (HCC)   Unspecified atrial fibrillation (HCC)   Peripheral vascular disease (HCC)   Above knee amputation of left lower extremity (HCC)   Normocytic Anemia Likely secondary to progressive kidney disease, anemia of chronic disease.  No mention of acute blood loss.  Patient has received 1 unit of packed RBC.  Serum iron 104 with low TIBC.  Latest hemoglobin of 10.2 at this time.  AKI on CKDIIIb Could be secondary to overdiuresis.  Continue to hold diuretics for now.  Renal ultrasound was unremarkable.  Nephrology on board and is on Ringer lactate solution today.  Check BMP in AM.  Continue 2.0 today.   Peripheral vascular disease (status post left AKA) With right limb ischemia and gangrene.  Awaiting for medical optimization.  Vascular surgery with tentative plan for angiogram and vascular intervention.   Chronic systolic heart failure ejection fraction of 25%. Appears compensated at this time.  Continue beta-blocker.  Hold ARB for now.  On low-dose  aspirin statins Plavix.  Gentle IV fluids at this time.    Diabetes with nephropathy Continue sliding scale insulin and Lantus.  Diabetic diet.  POC glucose of 125   GERD Continue PPI   Paroxysmal atrial fibrillation -continue metoprolol, not on anticoagulation.    Schizophrenia/dementia, PTSD On the risperidone.   Hyperlipidemia Continue Lipitor.      DVT prophylaxis:   Aspirin, Plavix   Code Status:     Code Status: Full Code  Disposition: Unknown at this time,  Status is: Inpatient  The patient is inpatient due to acute kidney injury, severe anemia, need for angiogram and possible vascular intervention   Family Communication:  Communicated with the patient's wife on 07/21/2021  Consultants:  Vascular surgery Nephrology  Procedures:  PRBC transfusion I unit  Antimicrobials:  None  Anti-infectives (From admission, onward)    None      Subjective: Today, patient was seen and examined at bedside.  Complains of mild foot pain.  Inquiring about foot surgery.  Denies any chest pain, shortness of breath, fever or chills.  Complains of mild nausea.  Objective: Vitals:   07/22/21 0800 07/22/21 1100 07/22/21 1200 07/22/21 1300  BP:      Pulse: (!) 58 (!) 56 (!) 58 (!) 59  Resp:      Temp:      TempSrc:      SpO2: 100% 96% 97% 98%  Weight:      Height:        Intake/Output Summary (Last 24 hours) at 07/22/2021 1409 Last data filed at 07/22/2021 0850 Gross per 24 hour  Intake 129.02 ml  Output 900 ml  Net -770.98 ml   Filed Weights   07/20/21 1158  Weight: 49.9 kg    Physical Examination: Body mass index is 14.51 kg/m.   General: Thinly built, not in obvious distress, elderly male, not in obvious distress, HENT:   No scleral pallor or icterus noted. Oral mucosa is moist.  Chest:  Clear breath sounds.  Diminished breath sounds bilaterally. No crackles or wheezes.  CVS: S1 &S2 heard. No murmur.  Regular rate and rhythm. Abdomen: Soft, nontender,  nondistended.  Bowel sounds are heard.   Extremities: Left above-knee amputation, right foot with ischemic discoloration. Psych: Alert, awake and oriented, normal mood CNS:  No cranial nerve deficits.  Power equal in all extremities.   Skin: Warm and dry.  Right lower extremity ischemic foot with great toe and second toe gangrene, left above-knee amputation.   Data Reviewed:   CBC: Recent Labs  Lab 07/20/21 1209 07/20/21 1218 07/20/21 1508 07/21/21 1018 07/21/21 2043  WBC  --  7.1  --  7.7  --   HGB 6.8* 6.9* 5.8* 10.6* 10.2*  HCT 20.0* 20.8*  --  30.0*  --   MCV  --  98.1  --  92.9  --   PLT  --  260  --  299  --     Basic Metabolic Panel: Recent Labs  Lab 07/20/21 1209 07/21/21 1018 07/22/21 0628  NA 135 140  140 137  K 4.6 4.4  4.4 4.4  CL 96* 101  101 101  CO2  --  '29  31 28  '$ GLUCOSE 235* 95  97 104*  BUN 86* 57*  57* 44*  CREATININE 3.50* 2.51*  2.54* 2.08*  CALCIUM  --  9.4  9.4 9.2  PHOS  --  3.8 3.3    Liver Function Tests: Recent Labs  Lab 07/21/21 1018 07/22/21 0628  AST 17  --   ALT 15  --   ALKPHOS 82  --   BILITOT 1.0  --   PROT 6.6  --   ALBUMIN 3.1*  3.1* 2.9*     Radiology Studies: US RENAL  Result Date: 07/20/2021 CLINICAL DATA:  Acute kidney injury. EXAM: RENAL / URINARY TRACT ULTRASOUND COMPLETE COMPARISON:  CT abdomen pelvis dated April 11, 2021. FINDINGS: Right Kidney: Renal measurements: 10.6 x 6.5 x 6.4 cm = volume: 233 mL. Echogenicity within normal limits. No mass or hydronephrosis visualized. Left Kidney: Renal measurements: 9.3 x 5.8 x 5.0 cm = volume: 141 mL. Echogenicity within normal limits. No mass or hydronephrosis visualized. 1.8 cm simple cyst in the upper pole. No follow-up imaging is recommended. Bladder: Decompressed by Foley catheter. Other: None. IMPRESSION: 1. No acute abnormality. Electronically Signed   By: Titus Dubin M.D.   On: 07/20/2021 14:51      LOS: 1 day    Flora Lipps, MD [facility] Available via Epic secure chat 7am-7pm After these hours, please refer to coverage provider listed on [email or web address] 07/22/2021, 2:09 PM

## 2021-07-22 NOTE — Progress Notes (Signed)
Nephrology Follow-Up Consult note   Assessment/Recommendations: Allen Yu is a/an 76 y.o. male with a past medical history significant for CHF, DM 2, CKD 3B, GERD, atrial fibrillation, schizophrenia, HLD, PVD who present w/ anemia and AKI    Non-Oliguric AKI on CKD3b: likely 2/2 dehydration and anemia.  Continues to improve -Creatinine 2.1 today, baseline 1.7-1.9 -Lactated Ringer's at 100 cc/h x 5 hours -hold home diuretics and losartan -Continue to monitor daily Cr, Dose meds for GFR -Monitor Daily I/Os, Daily weight  -Maintain MAP>65 for optimal renal perfusion.  -Avoid nephrotoxic medications including NSAIDs -Use synthetic opioids (Fentanyl/Dilaudid) if needed  Given the patient's improving kidney function we will sign off at this time.   CHF: appears slightly dry. Hydration as above. Hold diuretics   Anemia: mutlifactorial. Transfusions per primary team   RLE gangrene/PVD: vascular following. Medically optimizing before procedure   DM2: mgmt per primary   Recommendations conveyed to primary service.    [facility] 07/22/2021 10:01 AM  ___________________________________________________________  CC: AKI  Interval History/Subjective: Patient nauseated this morning with some vomiting.  Otherwise denies any complaints.   Medications:  Current Facility-Administered Medications  Medication Dose Route Frequency Provider Last Rate Last Admin   acetaminophen (TYLENOL) tablet 650 mg  650 mg Oral Q6H PRN Clance Boll, MD   650 mg at 07/21/21 2143   Or   acetaminophen (TYLENOL) suppository 650 mg  650 mg Rectal Q6H PRN Clance Boll, MD       albuterol (PROVENTIL) (2.5 MG/3ML) 0.083% nebulizer solution 2.5 mg  2.5 mg Nebulization Q2H PRN Clance Boll, MD       aspirin EC tablet 81 mg  81 mg Oral Q breakfast Myles Rosenthal A, MD   81 mg at 07/22/21 0842   atorvastatin (LIPITOR) tablet 40 mg  40 mg Oral QHS Myles Rosenthal A, MD   40 mg at 07/21/21 2142   clopidogrel (PLAVIX) tablet 75 mg  75 mg Oral Daily Myles Rosenthal A, MD   75 mg at 07/22/21 0843   donepezil (ARICEPT) tablet 10 mg  10 mg Oral QHS Myles Rosenthal A, MD   10 mg at 07/21/21 2142   ferrous sulfate tablet 325 mg  325 mg Oral Q breakfast Myles Rosenthal A, MD   325 mg at 07/22/21 0843   gabapentin (NEURONTIN) capsule 100 mg  100 mg Oral BID Myles Rosenthal A, MD   100 mg at 07/22/21 0842   insulin glargine-yfgn (SEMGLEE) injection 8 Units  8 Units Subcutaneous QHS Myles Rosenthal A, MD   8 Units at 07/21/21 2231   metoprolol succinate (TOPROL-XL) 24 hr tablet 50 mg  50 mg Oral Daily Myles Rosenthal A, MD   50 mg at 07/22/21 0843   multivitamin with minerals tablet 1 tablet  1 tablet Oral Daily Clance Boll, MD   1 tablet at 07/22/21 0843   ondansetron (ZOFRAN) tablet 4 mg  4 mg Oral Q6H PRN Clance Boll, MD   4 mg at 07/22/21 8182   Or   ondansetron (ZOFRAN) injection 4 mg  4 mg Intravenous Q6H PRN Clance Boll, MD       ondansetron [facility]) tablet 4 mg  4 mg Oral Q6H PRN Clance Boll, MD       pantoprazole (PROTONIX) EC tablet 40 mg  40 mg Oral Daily Myles Rosenthal A, MD   40 mg at 07/22/21 0842   risperiDONE (RISPERDAL) tablet 0.5 mg  0.5 mg Oral Q12H PRN Clance Boll, MD       traMADol Veatrice Bourbon) tablet 50 mg  50 mg Oral Q6H PRN Clance Boll, MD   50 mg at 07/21/21 2143      Review of Systems: 10 systems reviewed and negative except per interval history/subjective  Physical Exam: Vitals:   07/21/21 2016 07/22/21 0753  BP: (!) 141/80 (!) 178/80  Pulse: 60 69  Resp:  18  Temp:  98 F (36.7 C)  SpO2:  100%   Total I/O In: 120 [P.O.:120] Out: 900 [Urine:900]  Intake/Output Summary (Last 24 hours) at 07/22/2021 1001 Last data filed at 07/22/2021 0850 Gross per 24 hour  Intake 249.02 ml  Output 1250 ml  Net -1000.98 ml   Constitutional: Chronically ill-appearing, lying  in bed, dry heaving in bag ENMT: ears and nose without scars or lesions, MMM CV: normal rate, no edema Respiratory: clear to auscultation, normal work of breathing Gastrointestinal: soft, non-tender, no palpable masses or hernias Skin: Skin breakdown on right lower extremity.  Otherwise no visible lesions or rashesSkin breakdown on right lower extremity.  Otherwise no visible lesions or rashes Psych: alert, appropriate mood and affect   Test Results I personally reviewed new and old clinical labs and radiology tests Lab Results  Component Value Date   NA 137 07/22/2021   K 4.4 07/22/2021   CL 101 07/22/2021   CO2 28 07/22/2021   BUN 44 (H) 07/22/2021   CREATININE 2.08 (H) 07/22/2021   CALCIUM 9.2 07/22/2021   ALBUMIN 2.9 (L) 07/22/2021   PHOS 3.3 07/22/2021    CBC Recent Labs  Lab 07/20/21 1209 07/20/21 1218 07/20/21 1508 07/21/21 1018 07/21/21 2043  WBC  --  7.1  --  7.7  --   HGB 6.8* 6.9* 5.8* 10.6* 10.2*  HCT 20.0* 20.8*  --  30.0*  --   MCV  --  98.1  --  92.9  --   PLT  --  260  --  299  --

## 2021-07-23 DIAGNOSIS — N179 Acute kidney failure, unspecified: Secondary | ICD-10-CM | POA: Diagnosis not present

## 2021-07-23 DIAGNOSIS — N1832 Chronic kidney disease, stage 3b: Secondary | ICD-10-CM | POA: Diagnosis not present

## 2021-07-23 DIAGNOSIS — I5022 Chronic systolic (congestive) heart failure: Secondary | ICD-10-CM | POA: Diagnosis not present

## 2021-07-23 DIAGNOSIS — S78112A Complete traumatic amputation at level between left hip and knee, initial encounter: Secondary | ICD-10-CM | POA: Diagnosis not present

## 2021-07-23 LAB — BASIC METABOLIC PANEL
Anion gap: 9 (ref 5–15)
BUN: 37 mg/dL — ABNORMAL HIGH (ref 8–23)
CO2: 26 mmol/L (ref 22–32)
Calcium: 8.8 mg/dL — ABNORMAL LOW (ref 8.9–10.3)
Chloride: 99 mmol/L (ref 98–111)
Creatinine, Ser: 1.96 mg/dL — ABNORMAL HIGH (ref 0.61–1.24)
GFR, Estimated: 35 mL/min — ABNORMAL LOW (ref >60)
Glucose, Bld: 160 mg/dL — ABNORMAL HIGH (ref 70–99)
Potassium: 4.4 mmol/L (ref 3.5–5.1)
Sodium: 134 mmol/L — ABNORMAL LOW (ref 135–145)

## 2021-07-23 LAB — CBC
HCT: 26.4 % — ABNORMAL LOW (ref 39.0–52.0)
Hemoglobin: 9 g/dL — ABNORMAL LOW (ref 13.0–17.0)
MCH: 31.8 pg (ref 26.0–34.0)
MCHC: 34.1 g/dL (ref 30.0–36.0)
MCV: 93.3 fL (ref 80.0–100.0)
Platelets: 234 10*3/uL (ref 150–400)
RBC: 2.83 MIL/uL — ABNORMAL LOW (ref 4.22–5.81)
RDW: 14.4 % (ref 11.5–15.5)
WBC: 6.1 10*3/uL (ref 4.0–10.5)
nRBC: 0 % (ref 0.0–0.2)

## 2021-07-23 LAB — GLUCOSE, CAPILLARY
Glucose-Capillary: 74 mg/dL (ref 70–99)
Glucose-Capillary: 138 mg/dL — ABNORMAL HIGH (ref 70–99)
Glucose-Capillary: 161 mg/dL — ABNORMAL HIGH (ref 70–99)
Glucose-Capillary: 217 mg/dL — ABNORMAL HIGH (ref 70–99)

## 2021-07-23 LAB — MAGNESIUM: Magnesium: 2.1 mg/dL (ref 1.7–2.4)

## 2021-07-23 NOTE — Progress Notes (Signed)
PROGRESS NOTE    Allen Yu  [id number] DOB: [birth year] DOA: 07/20/2021 PCP: [name], MD    Brief Narrative:  Allen Yu is a 76 y.o. 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  In the ED, vitals were stable.  Renal ultrasound did not show any hydronephrosis.  Patient was transfused 1 unit of packed RBC and was admitted hospital for further evaluation and treatment.    Assessment and plan Principal Problem:   Anemia Active Problems:   Acute kidney injury superimposed on chronic kidney disease (HCC)   Schizophrenia, unspecified (HCC)   Chronic systolic CHF (congestive heart failure) (HCC)   Unspecified atrial fibrillation (HCC)   Peripheral vascular disease (HCC)   Above knee amputation of left lower extremity (HCC)   Normocytic Anemia Likely secondary to progressive kidney disease, anemia of chronic disease.  No mention of acute blood loss.  Patient has received 1 unit of packed RBC.  Serum iron 104 with low TIBC.  Latest hemoglobin of 9.0 at this time.  Stool occult blood pending  AKI on CKDIIIb Could be secondary to overdiuresis.  Continue to hold diuretics for now.  Renal ultrasound was unremarkable.  Nephrology followed the patient during hospitalization.  Creatinine level of 1.9 today which is likely at his baseline.  Baseline creatinine ranged between 1.7-1.9.  Nephrology has signed off at this time.  Peripheral vascular disease (status post left AKA) With right limb ischemia and gangrene.  Vascular surgery with tentative plan for angiogram and vascular intervention.  Creatinine at baseline at this time.   Chronic  systolic heart failure ejection fraction of 25%. Appears compensated at this time.  Continue beta-blocker.  We will continue to hold ARB for now.  On low-dose aspirin statins Plavix.  Received gentle IV fluid during hospitalization.    Diabetes with nephropathy Continue sliding scale insulin and Lantus.  Diabetic diet.  POC glucose of 125   GERD Continue PPI   Paroxysmal atrial fibrillation -continue metoprolol, not on anticoagulation.    Schizophrenia/dementia, PTSD On the risperidone.   Hyperlipidemia Continue Lipitor.      DVT prophylaxis:   Aspirin, Plavix   Code Status:     Code Status: Full Code  Disposition: Unknown at this time,  Status is: Inpatient  The patient is inpatient due to need for angiogram and possible vascular intervention   Family Communication:  Communicated with the patient's wife on 07/21/2021  Consultants:  Vascular surgery Nephrology  Procedures:  PRBC transfusion I unit  Antimicrobials:  None  Anti-infectives (From admission, onward)    None      Subjective: Today, patient was seen and examined at bedside.  Denies overt pain nausea vomiting fever chills or rigor.  Inquiring about surgery  Objective: Vitals:   07/22/21 2115 07/23/21 0500 07/23/21 0730 07/23/21 0800  BP: 120/66   (!) 149/119  Pulse: 73   79  Resp: 16     Temp: 98.2 F (36.8 C)   97.8 F (36.6 C)  TempSrc: Axillary   Axillary  SpO2: 100%  97%   Weight:  50.4 kg    Height:        Intake/Output Summary (Last 24 hours) at 07/23/2021 1045 Last data filed at 07/22/2021  1800 Gross per 24 hour  Intake --  Output 400 ml  Net -400 ml   Filed Weights   07/20/21 1158 07/23/21 0500  Weight: 49.9 kg 50.4 kg    Physical Examination: Body mass index is 14.66 kg/m.   General: Thinly built, not in obvious distress, elderly male HENT:   No scleral pallor or icterus noted. Oral mucosa is moist.  Chest:  Clear breath sounds.  Diminished breath sounds bilaterally.  No crackles or wheezes.  CVS: S1 &S2 heard. No murmur.  Regular rate and rhythm. Abdomen: Soft, nontender, nondistended.  Bowel sounds are heard.   Extremities: Left above-knee amputation, right foot with ischemic foot. Psych: Alert, awake and oriented, normal mood CNS:  No cranial nerve deficits.  Skin: Warm and dry.  Left above-knee amputation, right lower extremity ischemic foot with toe gangrene.   Data Reviewed:   CBC: Recent Labs  Lab 07/20/21 1209 07/20/21 1218 07/20/21 1508 07/21/21 1018 07/21/21 2043 07/23/21 0051  WBC  --  7.1  --  7.7  --  6.1  HGB 6.8* 6.9* 5.8* 10.6* 10.2* 9.0*  HCT 20.0* 20.8*  --  30.0*  --  26.4*  MCV  --  98.1  --  92.9  --  93.3  PLT  --  260  --  299  --  235    Basic Metabolic Panel: Recent Labs  Lab 07/20/21 1209 07/21/21 1018 07/22/21 0628 07/23/21 0051  NA 135 140  140 137 134*  K 4.6 4.4  4.4 4.4 4.4  CL 96* 101  101 101 99  CO2  --  '29  31 28 26  '$ GLUCOSE 235* 95  97 104* 160*  BUN 86* 57*  57* 44* 37*  CREATININE 3.50* 2.51*  2.54* 2.08* 1.96*  CALCIUM  --  9.4  9.4 9.2 8.8*  MG  --   --   --  2.1  PHOS  --  3.8 3.3  --     Liver Function Tests: Recent Labs  Lab 07/21/21 1018 07/22/21 0628  AST 17  --   ALT 15  --   ALKPHOS 82  --   BILITOT 1.0  --   PROT 6.6  --   ALBUMIN 3.1*  3.1* 2.9*     Radiology Studies: No results found.    LOS: 2 days    Flora Lipps, MD [facility] Available via Epic secure chat 7am-7pm After these hours, please refer to coverage provider listed on [email or web address] 07/23/2021, 10:45 AM

## 2021-07-24 DIAGNOSIS — S78112A Complete traumatic amputation at level between left hip and knee, initial encounter: Secondary | ICD-10-CM | POA: Diagnosis not present

## 2021-07-24 DIAGNOSIS — N1832 Chronic kidney disease, stage 3b: Secondary | ICD-10-CM | POA: Diagnosis not present

## 2021-07-24 DIAGNOSIS — I5022 Chronic systolic (congestive) heart failure: Secondary | ICD-10-CM | POA: Diagnosis not present

## 2021-07-24 DIAGNOSIS — N179 Acute kidney failure, unspecified: Secondary | ICD-10-CM | POA: Diagnosis not present

## 2021-07-24 LAB — GLUCOSE, CAPILLARY
Glucose-Capillary: 50 mg/dL — ABNORMAL LOW (ref 70–99)
Glucose-Capillary: 76 mg/dL (ref 70–99)
Glucose-Capillary: 136 mg/dL — ABNORMAL HIGH (ref 70–99)
Glucose-Capillary: 192 mg/dL — ABNORMAL HIGH (ref 70–99)
Glucose-Capillary: 270 mg/dL — ABNORMAL HIGH (ref 70–99)

## 2021-07-24 NOTE — Progress Notes (Signed)
PROGRESS NOTE    Allen Yu  ZOX:[id number] DOB: 11-21-1945 DOA: 07/20/2021 PCP: [name], MD    Brief Narrative:  Allen Yu is a 76 y.o. 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  In the ED, vitals were stable.  Renal ultrasound did not show any hydronephrosis.  Patient was transfused 1 unit of packed RBC and was admitted hospital for further evaluation and treatment.    Assessment and plan Principal Problem:   Anemia Active Problems:   Acute kidney injury superimposed on chronic kidney disease (HCC)   Schizophrenia, unspecified (HCC)   Chronic systolic CHF (congestive heart failure) (HCC)   Unspecified atrial fibrillation (HCC)   Peripheral vascular disease (HCC)   Above knee amputation of left lower extremity (HCC)   Normocytic Anemia Likely secondary to progressive kidney disease, anemia of chronic disease.  No mention of acute blood loss.  Patient has received 1 unit of packed RBC during hospitalization.  Latest hemoglobin of 9.0. Serum iron 104 with low TIBC.  Stool occult blood pending  AKI on CKDIIIb Could be secondary to overdiuresis.  Continue to hold diuretics for now.  Renal ultrasound was unremarkable.  Nephrology followed the patient during hospitalization.  Latest creatinine level of 1.9 today which is likely at his baseline.  Baseline creatinine ranged between 1.7-1.9.  Nephrology has signed off at this time.  Peripheral vascular disease (status post left AKA) With right limb ischemia and gangrene.  Vascular surgery with tentative plan for angiogram on 07/26/2021. creatinine at baseline at this time.   Chronic  systolic heart failure ejection fraction of 25%. Appears compensated at this time.  Continue beta-blocker.  We will continue to hold ARB for now.  On low-dose aspirin statins Plavix.  Received gentle IV fluid during hospitalization.    Diabetes with nephropathy Continue sliding scale insulin and Lantus.  Diabetic diet.  POC glucose of 125   GERD Continue PPI   Paroxysmal atrial fibrillation -continue metoprolol, not on anticoagulation.    Schizophrenia/dementia, PTSD On the risperidone.   Hyperlipidemia Continue Lipitor.      DVT prophylaxis:   Aspirin, Plavix   Code Status:     Code Status: Full Code  Disposition: Unknown at this time,  Status is: Inpatient  The patient is inpatient due to need for angiogram and possible vascular intervention   Family Communication:  Communicated with the patient's wife on 07/21/2021  Consultants:  Vascular surgery Nephrology  Procedures:  PRBC transfusion I unit  Antimicrobials:  None  Anti-infectives (From admission, onward)    None      Subjective: Today, patient was seen and examined at bedside.  Patient denies any nausea, vomiting, fever, chills or rigor.  No abdominal pain.  Has been eating some.  Inquiring about intervention and going home.  Objective: Vitals:   07/23/21 0730 07/23/21 0800 07/23/21 2144 07/24/21 0414  BP:  (!) 149/119 140/62 (!) 100/50  Pulse:  79    Resp:   18 16  Temp:  97.8 F (36.6 C) 98.2 F (36.8 C) 97.9 F (36.6 C)  TempSrc:  Axillary Oral Oral  SpO2: 97%   100%  Weight:      Height:        Intake/Output  Summary (Last 24 hours) at 07/24/2021 1053 Last data filed at 07/24/2021 1000 Gross per 24 hour  Intake 120 ml  Output 1200 ml  Net -1080 ml   Filed Weights   07/20/21 1158 07/23/21 0500  Weight: 49.9 kg 50.4 kg    Physical Examination: Body mass index is 14.66 kg/m.   General: Elderly male, thinly  built, not in obvious distress HENT:   No scleral pallor or icterus  noted. Oral mucosa is moist.  Chest:  Clear breath sounds.  Diminished breath sounds bilaterally. No crackles or wheezes.  CVS: S1 &S2 heard. No murmur.  Regular rate and rhythm. Abdomen: Soft, nontender, nondistended.  Bowel sounds are heard.   Extremities: Left above-knee amputation, right foot with ischemic changes. Psych: Alert, awake and oriented, normal mood CNS:  No cranial nerve deficits.  Power equal in all extremities.   Skin: Warm and dry.  Left above-knee amputation, right lower extremity ischemic foot with gangrene of the toes  Data Reviewed:   CBC: Recent Labs  Lab 07/20/21 1209 07/20/21 1218 07/20/21 1508 07/21/21 1018 07/21/21 2043 07/23/21 0051  WBC  --  7.1  --  7.7  --  6.1  HGB 6.8* 6.9* 5.8* 10.6* 10.2* 9.0*  HCT 20.0* 20.8*  --  30.0*  --  26.4*  MCV  --  98.1  --  92.9  --  93.3  PLT  --  260  --  299  --  366    Basic Metabolic Panel: Recent Labs  Lab 07/20/21 1209 07/21/21 1018 07/22/21 0628 07/23/21 0051  NA 135 140  140 137 134*  K 4.6 4.4  4.4 4.4 4.4  CL 96* 101  101 101 99  CO2  --  '29  31 28 26  '$ GLUCOSE 235* 95  97 104* 160*  BUN 86* 57*  57* 44* 37*  CREATININE 3.50* 2.51*  2.54* 2.08* 1.96*  CALCIUM  --  9.4  9.4 9.2 8.8*  MG  --   --   --  2.1  PHOS  --  3.8 3.3  --     Liver Function Tests: Recent Labs  Lab 07/21/21 1018 07/22/21 0628  AST 17  --   ALT 15  --   ALKPHOS 82  --   BILITOT 1.0  --   PROT 6.6  --   ALBUMIN 3.1*  3.1* 2.9*     Radiology Studies: No results found.    LOS: 3 days    Flora Lipps, MD [facility] Available via Epic secure chat 7am-7pm After these hours, please refer to coverage provider listed on [email or web address] 07/24/2021, 10:53 AM

## 2021-07-24 NOTE — Progress Notes (Addendum)
  Progress Note    07/24/2021 7:46 AM 4 Days Post-Op  Subjective:  no complaints   Vitals:   07/23/21 2144 07/24/21 0414  BP: 140/62 (!) 100/50  Pulse:    Resp: 18 16  Temp: 98.2 F (36.8 C) 97.9 F (36.6 C)  SpO2:  100%   Physical Exam: Lungs:  non labored Extremities:  dry gangrene R 1st and 2nd toe and medial foot Neurologic: somnolent  CBC    Component Value Date/Time   WBC 6.1 07/23/2021 0051   RBC 2.83 (L) 07/23/2021 0051   HGB 9.0 (L) 07/23/2021 0051   HCT 26.4 (L) 07/23/2021 0051   HCT 31.3 (L) 07/09/2015 0757   PLT 234 07/23/2021 0051   MCV 93.3 07/23/2021 0051   MCH 31.8 07/23/2021 0051   MCHC 34.1 07/23/2021 0051   RDW 14.4 07/23/2021 0051   LYMPHSABS 2.9 07/04/2021 1058   MONOABS 0.6 07/04/2021 1058   EOSABS 0.2 07/04/2021 1058   BASOSABS 0.1 07/04/2021 1058    BMET    Component Value Date/Time   NA 134 (L) 07/23/2021 0051   K 4.4 07/23/2021 0051   CL 99 07/23/2021 0051   CO2 26 07/23/2021 0051   GLUCOSE 160 (H) 07/23/2021 0051   BUN 37 (H) 07/23/2021 0051   CREATININE 1.96 (H) 07/23/2021 0051   CREATININE 1.10 11/26/2019 0934   CALCIUM 8.8 (L) 07/23/2021 0051   GFRNONAA 35 (L) 07/23/2021 0051   GFRAA >60 07/10/2019 0702    INR    Component Value Date/Time   INR 1.0 07/21/2021 1018     Intake/Output Summary (Last 24 hours) at 07/24/2021 0746 Last data filed at 07/23/2021 1700 Gross per 24 hour  Intake 240 ml  Output 800 ml  Net -560 ml     Assessment/Plan:  76 y.o. male with tissue loss R foot 4 Days Post-Op   Tentative plan is for R leg arteriogram and possible intervention on Wednesday 7/5 with Dr. Virl Cagey.  Patient is being medically optimized in preparation for this procedure.     Dagoberto Ligas, PA-C [facility] [phone] 07/24/2021 7:46 AM   VASCULAR STAFF ADDENDUM: I agree with the above.    [name]. Stanford Breed, MD [facility] Phone Number: [phone] 07/24/2021 12:24 PM

## 2021-07-24 NOTE — Inpatient Diabetes Management (Signed)
Inpatient Diabetes Program Recommendations  AACE/ADA: New Consensus Statement on Inpatient Glycemic Control (2015)  Target Ranges:  Prepandial:   less than 140 mg/dL      Peak postprandial:   less than 180 mg/dL (1-2 hours)      Critically ill patients:  140 - 180 mg/dL    Latest Reference Range & Units 07/23/21 07:58 07/23/21 11:23 07/23/21 16:29 07/23/21 21:44  Glucose-Capillary 70 - 99 mg/dL 74 138 (H) 161 (H) 217 (H)  8 units Semglee  (H): Data is abnormally high  Latest Reference Range & Units 07/24/21 07:59  Glucose-Capillary 70 - 99 mg/dL 50 (L)  (L): Data is abnormally low     Home DM Meds: Lantus 8 units QHS  Current Orders: Semglee 8 units QHS   MD- Note Hypoglycemia this AM after receiving 8 units Semglee last PM (CBG only 74 yesterday AM)  Please consider:  1. Reduce Semglee to 4 units QHS (50% home dose)  2. Start Novolog Sensitive Correction Scale/ SSI (0-9 units) TID AC + HS     --Will follow patient during hospitalization--  Wyn Quaker RN, MSN, CDE Diabetes Coordinator Inpatient Glycemic Control Team Team Pager: [phone] (8a-5p)

## 2021-07-24 NOTE — Plan of Care (Signed)

## 2021-07-25 DIAGNOSIS — N1832 Chronic kidney disease, stage 3b: Secondary | ICD-10-CM | POA: Diagnosis not present

## 2021-07-25 DIAGNOSIS — N179 Acute kidney failure, unspecified: Secondary | ICD-10-CM | POA: Diagnosis not present

## 2021-07-25 DIAGNOSIS — I739 Peripheral vascular disease, unspecified: Secondary | ICD-10-CM | POA: Diagnosis not present

## 2021-07-25 DIAGNOSIS — S78112A Complete traumatic amputation at level between left hip and knee, initial encounter: Secondary | ICD-10-CM | POA: Diagnosis not present

## 2021-07-25 LAB — GLUCOSE, CAPILLARY
Glucose-Capillary: 125 mg/dL — ABNORMAL HIGH (ref 70–99)
Glucose-Capillary: 191 mg/dL — ABNORMAL HIGH (ref 70–99)

## 2021-07-25 LAB — CBC
HCT: 26.8 % — ABNORMAL LOW (ref 39.0–52.0)
Hemoglobin: 9.3 g/dL — ABNORMAL LOW (ref 13.0–17.0)
MCH: 32.3 pg (ref 26.0–34.0)
MCHC: 34.7 g/dL (ref 30.0–36.0)
MCV: 93.1 fL (ref 80.0–100.0)
Platelets: 258 10*3/uL (ref 150–400)
RBC: 2.88 MIL/uL — ABNORMAL LOW (ref 4.22–5.81)
RDW: 14.1 % (ref 11.5–15.5)
WBC: 8.8 10*3/uL (ref 4.0–10.5)
nRBC: 0 % (ref 0.0–0.2)

## 2021-07-25 LAB — BASIC METABOLIC PANEL
Anion gap: 10 (ref 5–15)
BUN: 45 mg/dL — ABNORMAL HIGH (ref 8–23)
CO2: 25 mmol/L (ref 22–32)
Calcium: 9.4 mg/dL (ref 8.9–10.3)
Chloride: 104 mmol/L (ref 98–111)
Creatinine, Ser: 1.9 mg/dL — ABNORMAL HIGH (ref 0.61–1.24)
GFR, Estimated: 36 mL/min — ABNORMAL LOW (ref >60)
Glucose, Bld: 125 mg/dL — ABNORMAL HIGH (ref 70–99)
Potassium: 4.6 mmol/L (ref 3.5–5.1)
Sodium: 139 mmol/L (ref 135–145)

## 2021-07-25 LAB — MAGNESIUM: Magnesium: 2 mg/dL (ref 1.7–2.4)

## 2021-07-25 NOTE — Progress Notes (Signed)
Pts am meds returned to the pyxis.  Also wasted risperidone as it had already been upon. Disposed of in med waste container.  Witness by Lurlean Leyden RN.

## 2021-07-25 NOTE — Progress Notes (Signed)
Pt is very aggressive at this time and refusing all of his am risperidone. states wants to go home. MD ordered Haldol IM times one.  Pt is too aggressive at this time to administer, will try later.

## 2021-07-25 NOTE — Plan of Care (Signed)

## 2021-07-25 NOTE — Progress Notes (Signed)
Pt and wife requested that he leave AMA.  Dr Louanne Belton notified via chat.  MD agreed with plan and v/u.  No new orders. Papers signed per wife and pt was discharged AMA.

## 2021-07-25 NOTE — Discharge Summary (Signed)
Physician Discharge Summary  Allen Yu [id number]:[id number] DOB: 1945-03-27 DOA: 07/20/2021  PCP: [name], MD  Admit date: 07/20/2021 Discharge date: 07/25/2021  Admitted From: Home  Discharge disposition: Left AGAINST MEDICAL ADVICE   Recommendations for Outpatient Follow-Up:  Patient was advised to come back to the hospital as soon as possible to proceed with his diagnostic procedure for his ischemic foot.  Patient's wife aware of this.  Discharge Diagnosis:   Principal Problem:   Peripheral vascular disease ([place]) Active Problems:   Acute kidney injury superimposed on chronic kidney disease (HCC)   Anemia   Schizophrenia, unspecified (HCC)   Chronic systolic CHF (congestive heart failure) (HCC)   Unspecified atrial fibrillation (HCC)   Above knee amputation of left lower extremity ([place])   AKI (acute kidney injury) [facility])   Discharge Condition: Left AGAINST MEDICAL ADVICE  Diet recommendation: Recommended cardiac diet  Wound care: None.  Code status: Full.   History of Present Illness:   Allen Yu is a 76 y.o. 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  In the ED, vitals were stable.  Renal ultrasound did not show any hydronephrosis.  Patient was transfused 1 unit of packed RBC and was admitted hospital for further evaluation and treatment.  Hospital Course:   Following conditions were addressed during hospitalization as listed below,  Peripheral vascular disease (status post left AKA) With right limb ischemia and gangrene.  Vascular surgery followed the patient during hospitalization and the tentative plan was   angiogram on 07/26/2021. creatinine had been at baseline but patient decided to leave [place].  Normocytic Anemia Likely secondary to progressive kidney disease, anemia of chronic disease.  No mention of acute blood loss.  Patient  received 1 unit of packed RBC during hospitalization.  Latest hemoglobin of 9.0. Serum iron 104 with low TIBC.   AKI on CKDIIIb To be secondary to overdiuresis.  Diuretics were initially kept on hold.  Neurology also followed the patient during hospitalization.  Latest creatinine was 1.9 at his baseline so plan was angiogram but patient has decided to leave [place].   Chronic systolic heart failure ejection fraction of 25%. On beta-blocker and ARB at home.  During hospitalization diuretic was on hold and patient received IV fluids.    Diabetes with nephropathy On insulin regimen at home.   GERD On omeprazole at home.   Paroxysmal atrial fibrillation On metoprolol at home.  Not on anticoagulation    Schizophrenia/dementia, PTSD Risperidone at home.    Hyperlipidemia On Lipitor at home.   Disposition.  Despite  multiple efforts at discussing with the patient to stay in the hospital to proceed with diagnostic angiogram he has been very aggressive and not willing to stay in the hospital and has decided to leave [place].  Communicated with the patient's wife at length who was at bedside..  I have advised her to bring him to the hospital soon as possible and when he is ready to proceed with this procedure.  Medical Consultants:   Vascular surgery. Nephrology  Procedures:    None Subjective:   Today, patient was seen and examined at bedside patient is verbally aggressive and angry about being in the hospital.  Refused medication and intervention.  States that he wants to leave now.  Communicated with the patient's wife about this as well.  Discharge Exam:   Vitals:   07/25/21 0801 07/25/21 0900  BP: (!)  143/75 (!) 143/40  Pulse: 73 66  Resp: 14 16  Temp: (!) 97.5 F (36.4 C) 98.7 F (37.1 C)  SpO2: 100% 97%   Vitals:   07/24/21 0414 07/24/21 2051 07/25/21 0801 07/25/21 0900  BP: (!) 100/50 (!) 127/52 (!) 143/75 (!) 143/40  Pulse:  69 73 66  Resp: '16 19 14 16  '$ Temp: 97.9 F (36.6 C) 98.5 F (36.9 C) (!) 97.5 F (36.4 C) 98.7 F (37.1 C)  TempSrc: Oral Oral Oral Oral  SpO2: 100% 100% 100% 97%  Weight:      Height:       General: Alert awake, not in obvious distress elderly male, thinly built, agitated and angry HENT: pupils equally reacting to light,  No scleral pallor or icterus noted. Oral mucosa is moist.  Chest:  Clear breath sounds.  Diminished breath sounds bilaterally. No crackles or wheezes.  CVS: S1 &S2 heard. No murmur.  Regular rate and rhythm. Abdomen: Soft, nontender, nondistended.  Bowel sounds are heard.   Extremities: No cyanosis, clubbing or edema.  Peripheral pulses are palpable.  Left above-knee amputation, right foot with ischemic changes. Psych: Alert, awake, verbally aggressive and agitated. CNS:  No cranial nerve deficits.  Power equal in all extremities.   Skin: Warm and dry.  Left above-knee amputation, right lower extremity ischemic foot.  The results of significant diagnostics from this hospitalization (including imaging, microbiology, ancillary and laboratory) are listed below for reference.     Diagnostic Studies:   US RENAL  Result Date: 07/20/2021 CLINICAL DATA:  Acute kidney injury. EXAM: RENAL / URINARY TRACT ULTRASOUND COMPLETE COMPARISON:  CT abdomen pelvis dated April 11, 2021. FINDINGS: Right Kidney: Renal measurements: 10.6 x 6.5 x 6.4 cm = volume: 233 mL. Echogenicity within normal limits. No mass or hydronephrosis visualized. Left Kidney: Renal measurements: 9.3 x 5.8 x 5.0 cm = volume: 141 mL. Echogenicity within normal limits. No mass or hydronephrosis visualized. 1.8 cm simple cyst in the upper pole. No follow-up imaging is  recommended. Bladder: Decompressed by Foley catheter. Other: None. IMPRESSION: 1. No acute abnormality. Electronically Signed   By: Titus Dubin M.D.   On: 07/20/2021 14:51     Labs:   Basic Metabolic Panel: Recent Labs  Lab 07/20/21 1209 07/21/21 1018 07/22/21 0628 07/23/21 0051 07/25/21 0706  NA 135 140  140 137 134* 139  K 4.6 4.4  4.4 4.4 4.4 4.6  CL 96* 101  101 101 99 104  CO2  --  '29  31 28 26 25  '$ GLUCOSE 235* 95  97 104* 160* 125*  BUN 86* 57*  57* 44* 37* 45*  CREATININE 3.50* 2.51*  2.54* 2.08* 1.96* 1.90*  CALCIUM  --  9.4  9.4 9.2 8.8* 9.4  MG  --   --   --  2.1 2.0  PHOS  --  3.8 3.3  --   --    GFR Estimated Creatinine Clearance: 23.9 mL/min (A) (by C-G formula based on SCr of 1.9 mg/dL (H)). Liver Function Tests: Recent Labs  Lab 07/21/21 1018 07/22/21 0628  AST 17  --   ALT 15  --   ALKPHOS 82  --   BILITOT 1.0  --   PROT 6.6  --   ALBUMIN 3.1*  3.1* 2.9*   No results for input(s): "LIPASE", "AMYLASE" in the last 168 hours. No results for input(s): "AMMONIA" in the last 168 hours. Coagulation profile Recent Labs  Lab 07/20/21 1508 07/21/21 1018  INR 1.2 1.0    CBC: Recent Labs  Lab 07/20/21 1209 07/20/21 1218 07/20/21 1508 07/21/21 1018 07/21/21 2043 07/23/21 0051 07/25/21 0706  WBC  --  7.1  --  7.7  --  6.1 8.8  HGB 6.8* 6.9* 5.8* 10.6* 10.2* 9.0* 9.3*  HCT 20.0* 20.8*  --  30.0*  --  26.4* 26.8*  MCV  --  98.1  --  92.9  --  93.3 93.1  PLT  --  260  --  299  --  234 258   Cardiac Enzymes: No results for input(s): "CKTOTAL", "CKMB", "CKMBINDEX", "TROPONINI" in the last 168 hours. BNP: Invalid input(s): "POCBNP" CBG: Recent Labs  Lab 07/24/21 1133 07/24/21 1629 07/24/21 2235 07/25/21 0807 07/25/21 1111  GLUCAP 136* 192* 270* 125* 191*   D-Dimer No results for input(s): "DDIMER" in the last 72 hours. Hgb A1c No results for input(s): "HGBA1C" in the last 72 hours. Lipid Profile No results for input(s):  "CHOL", "HDL", "LDLCALC", "TRIG", "CHOLHDL", "LDLDIRECT" in the last 72 hours. Thyroid function studies No results for input(s): "TSH", "T4TOTAL", "T3FREE", "THYROIDAB" in the last 72 hours.  Invalid input(s): "FREET3" Anemia work up No results for input(s): "VITAMINB12", "FOLATE", "FERRITIN", "TIBC", "IRON", "RETICCTPCT" in the last 72 hours. Microbiology No results found for this or any previous visit (from the past 240 hour(s)).   Discharge Instructions:    Asked to resume home medication and come back to the hospital as soon as possible.    Time coordinating discharge: 39 minutes  Signed:  Micheala Morissette  [facility] 07/25/2021, 2:10 PM

## 2021-07-26 ENCOUNTER — Other Ambulatory Visit: Payer: Self-pay

## 2021-07-26 ENCOUNTER — Ambulatory Visit (HOSPITAL_COMMUNITY)
Admission: RE | Admit: 2021-07-26 | Discharge: 2021-07-26 | Disposition: A | Discharge status: Home / Self Care | Payer: Medicare Other | Source: Ambulatory Visit | Attending: Vascular Surgery | Admitting: Vascular Surgery

## 2021-07-26 ENCOUNTER — Encounter (HOSPITAL_COMMUNITY)
Admission: RE | Disposition: A | Discharge status: Home / Self Care | Payer: Self-pay | Source: Ambulatory Visit | Attending: Vascular Surgery

## 2021-07-26 DIAGNOSIS — L97219 Non-pressure chronic ulcer of right calf with unspecified severity: Secondary | ICD-10-CM | POA: Insufficient documentation

## 2021-07-26 DIAGNOSIS — E11622 Type 2 diabetes mellitus with other skin ulcer: Secondary | ICD-10-CM | POA: Diagnosis not present

## 2021-07-26 DIAGNOSIS — I70232 Atherosclerosis of native arteries of right leg with ulceration of calf: Secondary | ICD-10-CM | POA: Diagnosis not present

## 2021-07-26 DIAGNOSIS — N189 Chronic kidney disease, unspecified: Secondary | ICD-10-CM | POA: Diagnosis not present

## 2021-07-26 DIAGNOSIS — E1151 Type 2 diabetes mellitus with diabetic peripheral angiopathy without gangrene: Secondary | ICD-10-CM | POA: Diagnosis not present

## 2021-07-26 DIAGNOSIS — L97513 Non-pressure chronic ulcer of other part of right foot with necrosis of muscle: Secondary | ICD-10-CM | POA: Diagnosis not present

## 2021-07-26 DIAGNOSIS — E1122 Type 2 diabetes mellitus with diabetic chronic kidney disease: Secondary | ICD-10-CM | POA: Insufficient documentation

## 2021-07-26 DIAGNOSIS — I70235 Atherosclerosis of native arteries of right leg with ulceration of other part of foot: Secondary | ICD-10-CM

## 2021-07-26 DIAGNOSIS — Z89612 Acquired absence of left leg above knee: Secondary | ICD-10-CM | POA: Insufficient documentation

## 2021-07-26 DIAGNOSIS — I739 Peripheral vascular disease, unspecified: Secondary | ICD-10-CM

## 2021-07-26 HISTORY — PX: ABDOMINAL AORTOGRAM W/LOWER EXTREMITY: CATH118223

## 2021-07-26 HISTORY — PX: ENDOVASCULAR REPAIR/STENT GRAFT: CATH118280

## 2021-07-26 HISTORY — PX: PERIPHERAL VASCULAR BALLOON ANGIOPLASTY: CATH118281

## 2021-07-26 LAB — POCT I-STAT, CHEM 8
BUN: 53 mg/dL — ABNORMAL HIGH (ref 8–23)
Calcium, Ion: 1.15 mmol/L (ref 1.15–1.40)
Chloride: 106 mmol/L (ref 98–111)
Creatinine, Ser: 2.2 mg/dL — ABNORMAL HIGH (ref 0.61–1.24)
Glucose, Bld: 209 mg/dL — ABNORMAL HIGH (ref 70–99)
HCT: 26 % — ABNORMAL LOW (ref 39.0–52.0)
Hemoglobin: 8.8 g/dL — ABNORMAL LOW (ref 13.0–17.0)
Potassium: 4.9 mmol/L (ref 3.5–5.1)
Sodium: 140 mmol/L (ref 135–145)
TCO2: 27 mmol/L (ref 22–32)

## 2021-07-26 SURGERY — ABDOMINAL AORTOGRAM W/LOWER EXTREMITY
Anesthesia: LOCAL | Laterality: Right

## 2021-07-26 MED ORDER — HEPARIN SODIUM (PORCINE) 1000 UNIT/ML IJ SOLN
INTRAMUSCULAR | Status: AC
  Filled 2021-07-26: qty 10

## 2021-07-26 MED ORDER — HEPARIN (PORCINE) IN NACL 1000-0.9 UT/500ML-% IV SOLN
INTRAVENOUS | Status: AC
  Filled 2021-07-26: qty 1000

## 2021-07-26 MED ORDER — MIDAZOLAM HCL 2 MG/2ML IJ SOLN
INTRAMUSCULAR | Status: DC | PRN
  Administered 2021-07-26: .5 mg via INTRAVENOUS
  Administered 2021-07-26: 1 mg via INTRAVENOUS

## 2021-07-26 MED ORDER — SODIUM CHLORIDE 0.9% FLUSH: 3.0000 mL | INTRAVENOUS | Status: DC | PRN

## 2021-07-26 MED ORDER — MIDAZOLAM HCL 2 MG/2ML IJ SOLN
INTRAMUSCULAR | Status: AC
  Filled 2021-07-26: qty 2

## 2021-07-26 MED ORDER — ASPIRIN 81 MG PO CHEW
CHEWABLE_TABLET | ORAL | Status: AC
  Filled 2021-07-26: qty 1

## 2021-07-26 MED ORDER — HYDRALAZINE HCL 20 MG/ML IJ SOLN: 5.0000 mg | INTRAMUSCULAR | Status: DC | PRN

## 2021-07-26 MED ORDER — LIDOCAINE HCL (PF) 1 % IJ SOLN
INTRAMUSCULAR | Status: AC
  Filled 2021-07-26: qty 30

## 2021-07-26 MED ORDER — SODIUM CHLORIDE 0.9 % IV SOLN: 250.0000 mL | INTRAVENOUS | Status: DC | PRN

## 2021-07-26 MED ORDER — FENTANYL CITRATE (PF) 100 MCG/2ML IJ SOLN
INTRAMUSCULAR | Status: DC | PRN
  Administered 2021-07-26: 25 ug via INTRAVENOUS
  Administered 2021-07-26: 50 ug via INTRAVENOUS
  Administered 2021-07-26: 25 ug via INTRAVENOUS

## 2021-07-26 MED ORDER — SODIUM CHLORIDE 0.9 % WEIGHT BASED INFUSION
1.0000 mL/kg/h | INTRAVENOUS | Status: DC
Start: 2021-07-26 — End: 2021-07-26

## 2021-07-26 MED ORDER — LABETALOL HCL 5 MG/ML IV SOLN
INTRAVENOUS | Status: DC | PRN
  Administered 2021-07-26: 10 mg via INTRAVENOUS

## 2021-07-26 MED ORDER — LABETALOL HCL 5 MG/ML IV SOLN: 10.0000 mg | INTRAVENOUS | Status: DC | PRN

## 2021-07-26 MED ORDER — SODIUM CHLORIDE 0.9 % IV SOLN: INTRAVENOUS | Status: DC

## 2021-07-26 MED ORDER — ONDANSETRON HCL 4 MG/2ML IJ SOLN: 4.0000 mg | Freq: Four times a day (QID) | INTRAMUSCULAR | Status: DC | PRN

## 2021-07-26 MED ORDER — CLOPIDOGREL BISULFATE 75 MG PO TABS
ORAL_TABLET | ORAL | Status: DC | PRN
  Administered 2021-07-26: 75 mg via ORAL

## 2021-07-26 MED ORDER — FENTANYL CITRATE (PF) 100 MCG/2ML IJ SOLN
INTRAMUSCULAR | Status: AC
  Filled 2021-07-26: qty 2

## 2021-07-26 MED ORDER — ACETAMINOPHEN 325 MG PO TABS: 650.0000 mg | ORAL_TABLET | ORAL | Status: DC | PRN

## 2021-07-26 MED ORDER — ASPIRIN 81 MG PO CHEW
CHEWABLE_TABLET | ORAL | Status: DC | PRN
  Administered 2021-07-26: 81 mg via ORAL

## 2021-07-26 MED ORDER — LIDOCAINE HCL (PF) 1 % IJ SOLN
INTRAMUSCULAR | Status: DC | PRN
  Administered 2021-07-26: 12 mL

## 2021-07-26 MED ORDER — HEPARIN SODIUM (PORCINE) 1000 UNIT/ML IJ SOLN
INTRAMUSCULAR | Status: DC | PRN
  Administered 2021-07-26: 2000 [IU] via INTRAVENOUS
  Administered 2021-07-26: 3000 [IU] via INTRAVENOUS
  Administered 2021-07-26: 5000 [IU] via INTRAVENOUS

## 2021-07-26 MED ORDER — IODIXANOL 320 MG/ML IV SOLN
INTRAVENOUS | Status: DC | PRN
  Administered 2021-07-26: 130 mL

## 2021-07-26 MED ORDER — HEPARIN (PORCINE) IN NACL 1000-0.9 UT/500ML-% IV SOLN
INTRAVENOUS | Status: DC | PRN
  Administered 2021-07-26 (×2): 500 mL

## 2021-07-26 MED ORDER — SODIUM CHLORIDE 0.9% FLUSH: 3.0000 mL | Freq: Two times a day (BID) | INTRAVENOUS | Status: DC

## 2021-07-26 MED ORDER — CLOPIDOGREL BISULFATE 75 MG PO TABS
ORAL_TABLET | ORAL | Status: AC
  Filled 2021-07-26: qty 1

## 2021-07-26 SURGICAL SUPPLY — 25 items
BALLN STERLING OTW 3X220X150 (BALLOONS) ×2
BALLN STERLING OTW 5X220X150 (BALLOONS) ×2
BALLOON STERLING OTW 3X220X150 (BALLOONS) IMPLANT
BALLOON STERLING OTW 5X220X150 (BALLOONS) IMPLANT
CATH CXI 4F 150 DAV (CATHETERS) ×1 IMPLANT
CATH OMNI FLUSH 5F 65CM (CATHETERS) ×1 IMPLANT
CATH QUICKCROSS .018X135CM (MICROCATHETER) ×1 IMPLANT
DCB RANGER 4.0X200 150 (BALLOONS) IMPLANT
DEVICE CLOSURE MYNXGRIP 6/7F (Vascular Products) ×1 IMPLANT
GLIDEWIRE ADV .035X260CM (WIRE) ×1 IMPLANT
KIT ANGIASSIST CO2 SYSTEM (KITS) ×1 IMPLANT
KIT ENCORE 26 ADVANTAGE (KITS) ×1 IMPLANT
KIT MICROPUNCTURE NIT STIFF (SHEATH) ×1 IMPLANT
KIT PV (KITS) ×2 IMPLANT
RANGER DCB 4.0X200 150 (BALLOONS) ×2
SHEATH HIGHFLEX ANSEL 6FRX55 (SHEATH) ×1 IMPLANT
SHEATH PINNACLE 5F 10CM (SHEATH) ×1 IMPLANT
SHEATH PINNACLE 6F 10CM (SHEATH) ×1 IMPLANT
STENT BIOMIMICS 5X80 (Permanent Stent) ×1 IMPLANT
STENT INNOVA 6X150X130 (Permanent Stent) ×2 IMPLANT
SYR MEDRAD MARK V 150ML (SYRINGE) ×1 IMPLANT
TRANSDUCER W/STOPCOCK (MISCELLANEOUS) ×2 IMPLANT
TRAY PV CATH (CUSTOM PROCEDURE TRAY) ×2 IMPLANT
WIRE BENTSON .035X145CM (WIRE) ×1 IMPLANT
WIRE G V18X300CM (WIRE) ×1 IMPLANT

## 2021-07-26 NOTE — Progress Notes (Signed)
I had a long discussion with Allen Yu and his wife regarding his [place] 5 critical limb ischemia, now status post SFA, popliteal artery stenting, peroneal artery angioplasty.    Both patient and wife are aware that he is not an open surgical candidate.  I told them I do not know the longevity of today's intervention as stents are prone to thrombosis, especially in the setting of heart failure.  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  Should stent thrombosis occur, we discussed options being thrombolysis versus primary amputation.  Both Fermon and his wife stated they would likely pursue amputation over lysis, as the benefit would only be transient.  I will place referral to podiatry for foot care.  Broadus Saige MD

## 2021-07-26 NOTE — Op Note (Signed)
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  Ultrasound-guided micropuncture access of the left common femoral artery 2.  Aortogram-CO2 3.  Second-order cannulation, right lower extremity angiogram 4.  Balloon angioplasty of the peroneal artery 5.  Drug-coated balloon angioplasty of the popliteal artery 6.  SFA popliteal stenting from the mid SFA into the P2 segment of the popliteal artery. Popliteal stent 5 x 80 mm Bio-mimick, SFA stents 6 x 150 mm x 2 7.  Device assisted closure-Mynx    Indications: Patient is a 76 year old male with known peripheral vascular disease, left-sided above-knee amputation, who presented with Rutherford 5 critical limb ischemia in the right leg necrosis present on the first and second toes.  After discussing the risk and benefits of right lower extremity angiography in an effort to define and improve distal perfusion for wound healing, he elected to proceed.  Findings:  Aortogram: Bilateral renal arteries patent, no flow-limiting stenosis appreciated in the aortoiliac system bilaterally. On the right: Common femoral artery patent, profunda patent with severe atherosclerotic disease.  The superficial femoral artery demonstrates multiple flow-limiting stenoses greater than 99%.  The popliteal artery demonstrates focal, 99% stenosis in the P2 segment, followed by occlusion in the P3 segment with diffuse collaterals appreciated.  Distally, there is reconstitution of the peroneal artery in the proximal portion of the tibia.  This gives off medial and lateral perforators which fill the foot through collaterals.  The dorsalis pedis appears to reconstitute the foot with no named vessels otherwise appreciated.   Procedure:  The patient was identified in the holding  area and taken to room 8.  The patient was then placed supine on the table and prepped and draped in the usual sterile fashion.  A time out was called.  Ultrasound was used to evaluate the left common femoral artery.  It was patent .  A digital ultrasound image was acquired.  A micropuncture needle was used to access the left common femoral artery under ultrasound guidance.  An 018 wire was advanced without resistance and a micropuncture sheath was placed.  The 018 wire was removed and a benson wire was placed.  The micropuncture sheath was exchanged for a 5 french sheath.  An omniflush catheter was advanced over the wire to the level of L-1.  An abdominal angiogram was obtained.  Next, using the omniflush catheter and a benson wire, the aortic bifurcation was crossed and the catheter was placed into the right external iliac artery and right runoff was obtained.  See results above.  Due to the patient's significant frailty, I elected to attempt high risk endovascular intervention to restore inline flow to the foot.  A 6 x 55 cm sheath was brought onto the field and parked in the right superficial femoral artery.  From this location, a series of wires and catheters were used to cross the areas of tandem stenoses along the superficial femoral artery, popliteal artery, and the occlusion extending from the P3 segment of the popliteal artery through the tibial peroneal trunk, and into the proximal portion of the peroneal artery.  Angiography was used to ensure the wire was true lumen.  Next, I elected to work distal to proximal.  A 3 x 220 mm balloon was brought onto the field and inflated from the proximal portion of the peroneal artery through the  popliteal artery.  This demonstrated significant improvement in the peroneal occlusion, however this severe atherosclerotic disease appreciated at P2 remained flow-limiting.  I was afraid the orbital atherectomy would lead to trashing the single-vessel outflow, therefore I  elected to stent the lesion.  A 5 x 80 mm biomimicks stent was brought onto the field and positioned from the P2 segment into the superficial femoral artery.  The stent mal-deployed from its traditional pin and pull technique.  This was a product malfunction and not user error.  I was able to deploy the stent in its entirety with significant effort.  This was postdilated using a 4 mm balloon.  Follow-up angiography demonstrated improved flow.  Next, my attention turned to the superficial femoral artery. Two, 6 x 150 mm stents were deployed with overlap from the 5 mm stent to the proximal superficial femoral artery.  A 5 mm balloon was used to angioplasty the near-full metal jacket extending from the proximal superficial femoral artery to the P2 segment of the popliteal artery.  Follow-up angiography demonstrated resolution of flow-limiting stenosis within the stented portions, however there was a small lesion immediately distal to the popliteal stent.  The 4 mm balloon was again used with a prolonged angioplasty.  Follow-up angiography demonstrated excellent result with resolution of stenosis.  There was a small dissection appreciated in the proximal peroneal artery that was left as it was nonflow limiting.  Minx device was used for left CFA closure without issue.  Impression: Successful balloon angioplasty and stenting of the superficial femoral artery, popliteal artery, successful balloon angioplasty of the peroneal artery.  Single-vessel inline flow to the foot via the peroneal artery.  Patient has been optimally revascularized.  Cassandria Santee, MD [facility] [place] Office: [phone]

## 2021-07-26 NOTE — H&P (Signed)
  Daily Progress Note    Patient is a 76 year old male with right lower extremity Rutherford 5 critical limb ischemia No complaints this morning, accompanied by wife at bedside.  Objective: Vitals:   07/26/21 0926  BP: (!) 147/63  Pulse: 78  Resp: 16  Temp: 97.8 F (36.6 C)  SpO2: 97%    Physical Examination Dry necrosis to first and second toes, superficial pretibial wound on the right leg as well. Left-sided above-knee amputation  ASSESSMENT/PLAN:  Patient is a 76 year old male with right lower extremity Rutherford 5 critical limb ischemia.  He uses a wheelchair at baseline, but is able to turn and pivot with his right leg. Patient has CKD, will start the angiogram with CO2. Both Zachery and his wife are aware that he is at high risk of limb loss  After discussing the risk and benefits of right lower extremity angiography in an effort to define and possibly improve distal perfusion for wound healing, Son elected to proceed.   WIFI Score: Wound: 2, Ischemia 3, Foot infection 0 Estimate risk of amputation at one year without revascularization High    [email or web address]       J. Melene Muller MD MS [facility] [phone] 07/26/2021  9:38 AM

## 2021-07-27 ENCOUNTER — Ambulatory Visit (HOSPITAL_COMMUNITY): Payer: TRICARE For Life (TFL) | Admitting: Physical Therapy

## 2021-07-27 ENCOUNTER — Encounter (HOSPITAL_COMMUNITY): Payer: Self-pay | Admitting: Vascular Surgery

## 2021-07-28 ENCOUNTER — Other Ambulatory Visit: Payer: Self-pay | Admitting: *Deleted

## 2021-07-28 NOTE — Patient Outreach (Unsigned)
[facility]) Care Management  07/28/2021  Allen Yu 1945-10-28 [id number]   [place] coordination  [facility] multidisciplinary care discussion template completed     Plan [facility] RN CM will follow up with patient within the next 30+ business days  Roneka Gilpin L. [name], RN, BSN, [place] Coordinator Office number [phone]

## 2021-07-31 ENCOUNTER — Other Ambulatory Visit: Payer: Self-pay | Admitting: *Deleted

## 2021-07-31 NOTE — Patient Outreach (Signed)
  Care Coordination TOC Note Transition Care Management Follow-up Telephone Call Date of discharge and from where: 07/25/21 &07/26/21 left ama on 07/25/21 and wife returned him on 07/26/21 for angiogram How have you been since you were released from the hospital? [place] as wife and grandchildren visiting out of town for a few days, Wife prepared his clothes, medicines and food prior to the trip.  Other family to visit patient during this time.  Any questions or concerns? No Denied concerns and needs today  Items Reviewed: Did the pt receive and understand the discharge instructions provided? Yes  Medications obtained and verified? Yes  Other?  N/a Any new allergies since your discharge? No  Dietary orders reviewed? Yes Do you have support at home? Yes   Home Care and Equipment/Supplies: Were home health services ordered? yes If so, what is the name of the agency? [facility] and [name] is now paid for aide services  Has the agency set up a time to come to the patient's home? yes Were any new equipment or medical supplies ordered?  No What is the name of the medical supply agency? N/a Were you able to get the supplies/equipment? not applicable Do you have any questions related to the use of the equipment or supplies? No  Functional Questionnaire: (I = Independent and D = Dependent) ADLs: I  Bathing/Dressing- I  Meal Prep- D  Eating- I  Maintaining continence- I  Transferring/Ambulation- I  Managing Meds- I  Follow up appointments reviewed:  PCP Hospital f/u appt confirmed? Yes  Scheduled to see Dr Willey Blade on last week @ his office. [facility] f/u appt confirmed? Yes  Scheduled to see Dr Unice Bailey in [place]   Are transportation arrangements needed? Yes  If their condition worsens, is the pt aware to call PCP or go to the Emergency Dept.? Yes Was the patient provided with contact information for the PCP's office or ED? Yes Was to pt  encouraged to call back with questions or concerns? Yes  SDOH assessments and interventions completed:   Yes  Care Coordination Interventions Activated:  No Care Coordination Interventions:   n/a  Encounter Outcome:  Pt. Visit Completed  Allen Yu L. [name], RN, BSN, [place] Coordinator Office number [phone] Main [facility] number [phone] Fax number [phone]

## 2021-08-03 ENCOUNTER — Encounter: Payer: Self-pay | Admitting: *Deleted

## 2021-08-03 NOTE — Patient Outreach (Addendum)
[facility]) Care Management  08/03/2021  Allen Yu 02-02-1945 [id number]  [name] was discussed today in the multidiciplanary committee. Care management services to continue. Suggestion: palliative care consult.  Eulah Pont. Myrtie Neither, MSN, [facility] Gerontological Nurse Practitioner [facility] Care Management [phone]

## 2021-08-04 DIAGNOSIS — I739 Peripheral vascular disease, unspecified: Secondary | ICD-10-CM | POA: Diagnosis not present

## 2021-08-04 DIAGNOSIS — I5023 Acute on chronic systolic (congestive) heart failure: Secondary | ICD-10-CM | POA: Diagnosis not present

## 2021-08-04 DIAGNOSIS — D649 Anemia, unspecified: Secondary | ICD-10-CM | POA: Diagnosis not present

## 2021-08-08 ENCOUNTER — Ambulatory Visit (HOSPITAL_COMMUNITY): Payer: Medicare Other | Admitting: Physical Therapy

## 2021-08-08 ENCOUNTER — Encounter (HOSPITAL_COMMUNITY): Payer: Self-pay | Admitting: Physical Therapy

## 2021-08-08 NOTE — Therapy (Unsigned)
[place], [place], [address] Phone: [phone]   Fax:  [phone]  Patient Details  Name: Allen Yu MRN: [id number] Date of Birth: [birth year] Referring Provider:  No ref. provider found  Encounter Date: 08/08/2021 PHYSICAL THERAPY DISCHARGE SUMMARY  Visits from Start of Care: 1  Current functional level related to goals / functional outcomes: Therapist requested pt to see vascular surgeon prior to therapy being initiated.  Pt had femoral angioplasty and stenting of femoral and popliteal artery on 7/5.  Pt will be discharged at this time.     Patient agrees to discharge. Patient goals were not met. Patient is being discharged due to  had vascuar surgery.  Rayetta Humphrey, PT CLT [phone]  08/08/2021, 9:42 AM  [place], [place], [address] Phone: [phone]   Fax:  [phone]

## 2021-08-09 ENCOUNTER — Ambulatory Visit: Payer: Self-pay | Admitting: *Deleted

## 2021-08-09 NOTE — Patient Outreach (Signed)
  Care Coordination   Follow Up Visit Note   08/09/2021 Name: ARVO EALY MRN: [id number] DOB: 10/12/1945  JAKYREN FLUEGGE is a 76 y.o. year old male who sees [name], MD for primary care. I spoke with  Miki Kins by phone today briefly  What matters to the patients health and wellness today?  Pt doing well and is presently at an outpatient appointment with his wife. [name], wife denies needs during the brief outreach today. Agrees to further follow up    Goals Addressed   None     SDOH assessments and interventions completed:   No   Care Coordination Interventions Activated:  No Care Coordination Interventions:   No, not indicated  Follow up plan: Follow up call scheduled for within the next 30 business days  Encounter Outcome:  Pt. Visit Completed

## 2021-08-10 ENCOUNTER — Ambulatory Visit (HOSPITAL_COMMUNITY): Payer: TRICARE For Life (TFL)

## 2021-08-14 ENCOUNTER — Ambulatory Visit: Payer: Medicare Other | Admitting: Podiatry

## 2021-08-15 ENCOUNTER — Ambulatory Visit (HOSPITAL_COMMUNITY): Payer: TRICARE For Life (TFL) | Admitting: Physical Therapy

## 2021-08-15 ENCOUNTER — Ambulatory Visit (INDEPENDENT_AMBULATORY_CARE_PROVIDER_SITE_OTHER): Payer: Medicare Other | Admitting: Podiatry

## 2021-08-15 DIAGNOSIS — I96 Gangrene, not elsewhere classified: Secondary | ICD-10-CM

## 2021-08-15 DIAGNOSIS — E1165 Type 2 diabetes mellitus with hyperglycemia: Secondary | ICD-10-CM | POA: Diagnosis not present

## 2021-08-15 DIAGNOSIS — I739 Peripheral vascular disease, unspecified: Secondary | ICD-10-CM | POA: Diagnosis not present

## 2021-08-15 DIAGNOSIS — L97913 Non-pressure chronic ulcer of unspecified part of right lower leg with necrosis of muscle: Secondary | ICD-10-CM | POA: Diagnosis not present

## 2021-08-15 NOTE — Progress Notes (Signed)
  Subjective:  Patient ID: [name], male    DOB: 1945/09/03,  MRN: [id number]  Chief Complaint  Patient presents with   Diabetic Ulcer    non healing wound*diabetic*, located in the right foot, great Hallux and 2nd digit, pain level 43.    76 y.o. male presents with the above complaint. History confirmed with patient.  He is well-known to vascular surgery, most recently underwent angiography with Dr. Unk Lightning on 07/26/2021.  His wife provides much of the history as he has dementia.  Objective:  Physical Exam: He has a nonpalpable DP and PT pulse, the foot is fairly well perfused and warm through the midfoot, cool of the toes, he has gangrene of the first and second digits encompassing to the metatarsal phalangeal joint level, distal tip of the third and fourth toes.  Large necrotic eschar on lower leg measuring 15 x 3 cm Assessment:   1. Gangrene of right foot ([place])   2. Ulcer of right leg, with necrosis of muscle ([place])   3. Type 2 diabetes mellitus with hyperglycemia, unspecified whether long term insulin use ([place])   4. PAD (peripheral artery disease) ([place])      Plan:  Patient was evaluated and treated and all questions answered.  I saw and evaluated the patient today and discussed my clinical exam findings and I reviewed the angiography performed by Dr. Unk Lightning on 07/26/2021 he has single-vessel runoff through the peroneal artery to the midfoot.  His foot is fairly warm and well-perfused and I suspect he has good collateralization through the midfoot.  I discussed with his wife that I do not expect that the toes are salvageable and I recommend a transmetatarsal amputation and debridement of the lower leg ulcer with skin substitute application.  I discussed admitting him to the hospital and performing this as an inpatient and discharged to rehab, his wife would prefer to avoid this as his dementia and PTSD significantly worsens every time he is admitted to the hospital and nursing  facilities.  I discussed with her it is possible to do this as an outpatient but they will need to see their PCP.  We discussed the risk benefits and potential complication of the procedure including the risk of nonhealing and need for further amputation including below-knee or above-knee amputation if it is not successful.  He will require ongoing wound care for the lower leg and following surgery we will refer him to the wound care center pending results of his surgical intervention.  Informed consent was signed and reviewed.  All questions were addressed prior to surgery.  No guarantees as the outcome of surgery were made.  I will see him at surgery.  No follow-ups on file.

## 2021-08-16 ENCOUNTER — Other Ambulatory Visit: Payer: Self-pay | Admitting: *Deleted

## 2021-08-16 ENCOUNTER — Encounter: Payer: Self-pay | Admitting: *Deleted

## 2021-08-16 ENCOUNTER — Other Ambulatory Visit: Payer: Self-pay

## 2021-08-16 ENCOUNTER — Encounter (HOSPITAL_COMMUNITY): Payer: Self-pay | Admitting: Podiatry

## 2021-08-16 ENCOUNTER — Ambulatory Visit (HOSPITAL_COMMUNITY)
Admission: RE | Admit: 2021-08-16 | Discharge: 2021-08-16 | Disposition: A | Discharge status: Home / Self Care | Payer: Medicare Other | Source: Ambulatory Visit | Attending: Student | Admitting: Student

## 2021-08-16 DIAGNOSIS — R0600 Dyspnea, unspecified: Secondary | ICD-10-CM | POA: Insufficient documentation

## 2021-08-16 DIAGNOSIS — R059 Cough, unspecified: Secondary | ICD-10-CM | POA: Insufficient documentation

## 2021-08-16 DIAGNOSIS — I739 Peripheral vascular disease, unspecified: Secondary | ICD-10-CM

## 2021-08-16 DIAGNOSIS — I70229 Atherosclerosis of native arteries of extremities with rest pain, unspecified extremity: Secondary | ICD-10-CM

## 2021-08-16 DIAGNOSIS — J9811 Atelectasis: Secondary | ICD-10-CM | POA: Diagnosis not present

## 2021-08-16 NOTE — Progress Notes (Signed)
Anesthesia note:  Bowel prep reminder:NA  PCP - Dr. Salena Saner Cardiologist -Dr. Myles Gip Other-   Chest x-ray -  EKG -  Stress Test -  ECHO -  Cardiac Cath -   Pacemaker/ICD device last checked:  Sleep Study - no CPAP -   Fasting Blood Sugar - 98-130 Checks Blood Sugar __BID___  Fraser Din on Lt arm  Blood Thinner:ASA, Plavix Blood Thinner Instructions:none that the wife could find Aspirin Instructions: Last Dose:  Anesthesia review: yes  Patient denies shortness of breath, fever, cough and chest pain at PAT appointment Pt uses a wheelchair and is an AKA on Lt. He has dementia, PTSD and schizophrenia. He has drop foot on Rt with the ulcer.Wife cares for him at home. He goes to the [facility]  Patient verbalized understanding of instructions that were given to them at the PAT appointment. Patient was also instructed that they will need to review over the PAT instructions again at home before surgery. Instructions given to wife. NPO after 9:45 am Clear liquids black coffee, water and sugar free clear (no red  or pulp ) juice.after midnight.  No insulin, ASA or plavix on DOS.  Arrive to admitting at 1:45 pm Take Gabapentin, Lipitor, Metoprolol, Omeprazole the morning of surgery.

## 2021-08-16 NOTE — Progress Notes (Addendum)
Anesthesia Chart Review   Case: [id number] Date/Time: 08/17/21 1530   Procedures:      TRANSMETATARSAL AMPUTATION (Right: Foot)     IRRIGATION AND DEBRIDEMENT ULCER RIGHT FOOT (Right: Foot)   Anesthesia type: Choice   Pre-op diagnosis: GANGRENE RIGHT FOOT   Location: [facility] ROOM 08 / [facility]   Surgeons: Criselda Peaches, DPM       DISCUSSION:75 y.o. smoker with h/o schizophrenia, dementia, GERD, HTN, DM II, CKD Stage III, PVD (s/p left AKA, status post right SFA, popliteal artery stenting, peroneal artery angioplasty), carotid stenosis, PAF, CHF (EF 35-40%), gangrene right foot scheduled for above procedure 08/17/2021 with Lanae Crumbly, DPM.   Pt with recent admission 07/20/2021-07/25/2021. Hemoglobin 6.8, likely secondary to progressive kidney disease.  Transfused with 1 unit PRBC. Hemoglobin 9.0 at discharge.   Pt last seen by cardiology 07/14/2021.   H&P from PCP received which states pt is cleared for surgery with moderate risk.   Anticipate pt can proceed with planned procedure barring acute status change.   VS: Ht '6\' 1"'$  (1.854 m)   Wt 53.5 kg   BMI 15.57 kg/m   PROVIDERS: [name], MD is PCP   Cardiologist: Rozann Lesches, MD   LABS:  recheck hemoglobin (all labs ordered are listed, but only abnormal results are displayed)  Labs Reviewed - No data to display   IMAGES: VAS US Carotid 02/28/2021 Summary:  Right Carotid: Velocities in the right ICA are consistent with a 1-39%  stenosis.   Left Carotid: Velocities in the left ICA are consistent with a 1-39%  stenosis.   Vertebrals:  Bilateral vertebral arteries demonstrate antegrade flow.  Subclavians: Normal flow hemodynamics were seen in bilateral subclavian               arteries.   EKG:   CV: Echo 07/04/2021 1. Left ventricular ejection fraction, by estimation, is 35 to 40%. The  left ventricle has moderately decreased function. The left ventricle  demonstrates global hypokinesis. Left ventricular diastolic  parameters are  consistent with Grade I diastolic  dysfunction (impaired relaxation).   2. Right ventricular systolic function is normal. The right ventricular  size is normal. There is moderately elevated pulmonary artery systolic  pressure. The estimated right ventricular systolic pressure is 02.5 mmHg.   3. The mitral valve is grossly normal. Trivial mitral valve  regurgitation. No evidence of mitral stenosis.   4. The aortic valve is tricuspid. There is mild calcification of the  aortic valve. Aortic valve regurgitation is not visualized. Aortic valve  sclerosis is present, with no evidence of aortic valve stenosis.   5. The inferior vena cava is dilated in size with >50% respiratory  variability, suggesting right atrial pressure of 8 mmHg.  Past Medical History:  Diagnosis Date   Anemia    Arthritis    Carotid stenosis    Chronic back pain    Chronic HFrEF (heart failure with reduced ejection fraction) ([place])    a. 09/2020 Echo: EF 45%, mild LVH; b. 12/2020 Echo: EF 25-30%, glob HK, mod LVH, mildly reduced RV fxn, RVSP 65.23mHg, mod BAE. Triv effusion. Mod MR/TR.   CKD (chronic kidney disease), stage III ([place]    Constipation    Dementia ([place]    Diabetes mellitus    Dilated cardiomyopathy ([place]    a. 09/2020 Echo: EF 45%; b. 12/2020 Echo: EF 25-30%.   Foot drop, right    GERD (gastroesophageal reflux disease)    History of kidney stones  Hypertension    Lung nodule    PAF (paroxysmal atrial fibrillation) (H[facility])    Peripheral neuropathy    Peripheral vascular disease ([place])    a. 06/2020 s/p L AKA; b. 10/2020 s/p R SFA and above-knee popliteal PTA.   PTSD (post-traumatic stress disorder)    Schizophrenia ([place])    Tuberculosis    Treated    Past Surgical History:  Procedure Laterality Date   ABDOMINAL AORTOGRAM W/LOWER EXTREMITY N/A 05/20/2020   Procedure: ABDOMINAL AORTOGRAM W/LOWER EXTREMITY;  Surgeon: Elam Dutch, MD;  Location: [place] [facility];  Service:  Cardiovascular;  Laterality: N/A;   ABDOMINAL AORTOGRAM W/LOWER EXTREMITY N/A 10/27/2020   Procedure: ABDOMINAL AORTOGRAM W/LOWER EXTREMITY;  Surgeon: Marty Heck, MD;  Location: [place] [facility];  Service: Cardiovascular;  Laterality: N/A;   ABDOMINAL AORTOGRAM W/LOWER EXTREMITY Right 07/26/2021   Procedure: ABDOMINAL AORTOGRAM W/LOWER EXTREMITY;  Surgeon: Broadus Ruhan, MD;  Location: [place] [facility] LAB;  Service: Cardiovascular;  Laterality: Right;   AMPUTATION Left 06/28/2020   Procedure: AMPUTATION ABOVE KNEE LEFT;  Surgeon: Rosetta Posner, MD;  Location: [place];  Service: Vascular;  Laterality: Left;   [place]  2000, 2013   x2   BLADDER SURGERY     02   CATARACT EXTRACTION W/PHACO Right 05/12/2021   Procedure: CATARACT EXTRACTION PHACO AND INTRAOCULAR LENS PLACEMENT ([place]);  Surgeon: Baruch Goldmann, MD;  [facility];  Service: Ophthalmology;  Laterality: Right;  CDE: 27.28   CATARACT EXTRACTION W/PHACO Left 05/29/2021   Procedure: CATARACT EXTRACTION PHACO AND INTRAOCULAR LENS PLACEMENT (I[facility]);  Surgeon: Baruch Goldmann, MD;  [facility];  Service: Ophthalmology;  Laterality: Left;  CDE  12.96   CHOLECYSTECTOMY     COLONOSCOPY     COLONOSCOPY N/A 12/07/2014   Procedure: COLONOSCOPY;  Surgeon: Daneil Dolin, MD;  [facility];  Service: Endoscopy;  Laterality: N/A;  10:30 Am   ENDOVASCULAR REPAIR/STENT GRAFT Right 07/26/2021   Procedure: ENDOVASCULAR REPAIR/STENT GRAFT;  Surgeon: Broadus Zaki, MD;  Location: [place] CV LAB;  Service: Cardiovascular;  Laterality: Right;  SFA   EYE SURGERY Bilateral    removed metal from eye   FEMORAL-TIBIAL BYPASS GRAFT Left 05/27/2020   Procedure: LEFT FEMORAL TO PERONEAL ARTERY BYPASS;  Surgeon: Rosetta Posner, MD;  Location: [place];  Service: Vascular;  Laterality: Left;   HERNIA REPAIR Right    INGUINAL HERNIA REPAIR Left 11/03/2013   Procedure: HERNIA REPAIR INGUINAL ADULT;  Surgeon: Gayland Curry,  MD;  Location: [place];  Service: General;  Laterality: Left;   PERIPHERAL VASCULAR BALLOON ANGIOPLASTY Right 07/26/2021   Procedure: PERIPHERAL VASCULAR BALLOON ANGIOPLASTY;  Surgeon: Broadus Baltazar, MD;  Location: [place] [facility];  Service: Cardiovascular;  Laterality: Right;  PERONEAL   PERIPHERAL VASCULAR INTERVENTION  10/27/2020   Procedure: PERIPHERAL VASCULAR INTERVENTION;  Surgeon: Marty Heck, MD;  Location: [place] [facility];  Service: Cardiovascular;;   SHOULDER SURGERY     RIGHT SHOULDER    VIDEO BRONCHOSCOPY WITH ENDOBRONCHIAL NAVIGATION N/A 07/10/2019   Procedure: VIDEO BRONCHOSCOPY WITH ENDOBRONCHIAL NAVIGATION;  Surgeon: Melrose Nakayama, MD;  Location: [place];  Service: Thoracic;  Laterality: N/A;    MEDICATIONS: No current facility-administered medications for this encounter.    acetaminophen (TYLENOL) 500 MG tablet   Alcohol Swabs (ALCOHOL PADS) 70 % PADS   aspirin EC 81 MG tablet   atorvastatin (LIPITOR) 40 MG tablet  B-D UF III MINI PEN NEEDLES 31G X 5 MM MISC   clopidogrel (PLAVIX) 75 MG tablet   donepezil (ARICEPT) 10 MG tablet   feeding supplement, ENSURE COMPLETE, (ENSURE COMPLETE) LIQD   ferrous sulfate 325 (65 FE) MG tablet   gabapentin (NEURONTIN) 100 MG capsule   LANTUS 100 UNIT/ML injection   losartan (COZAAR) 50 MG tablet   metoprolol succinate (TOPROL-XL) 50 MG 24 hr tablet   Multiple Vitamins-Minerals (MULTIVITAMIN WITH MINERALS) tablet   omeprazole (PRILOSEC) 20 MG capsule   ondansetron (ZOFRAN) 4 MG tablet   risperiDONE (RISPERDAL) 0.5 MG tablet   torsemide 40 MG TABS   traMADol (ULTRAM) 50 MG tablet    [facility], PA-C [facility] Pre-Surgical Testing [phone]
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
## 2021-08-16 NOTE — Anesthesia Preprocedure Evaluation (Signed)
Anesthesia Evaluation  Patient identified by MRN, date of birth, ID band Patient awake    Reviewed: Allergy & Precautions, H&P , NPO status , Patient's Chart, lab work & pertinent test results, reviewed documented beta blocker date and time   Airway Mallampati: II  TM Distance: >3 FB Neck ROM: full    Dental no notable dental hx. (+) Poor Dentition, Chipped, Missing, Dental Advisory Given   Pulmonary neg pulmonary ROS, Current Smoker and Patient abstained from smoking.,    Pulmonary exam normal breath sounds clear to auscultation       Cardiovascular Exercise Tolerance: Good hypertension, + CAD, + Peripheral Vascular Disease and +CHF   Rhythm:regular Rate:Normal     Neuro/Psych PSYCHIATRIC DISORDERS Anxiety Schizophrenia Dementia VAS US Carotid 02/28/2021 Summary:  Right Carotid: Velocities in the right ICA are consistent with a 1-39%  stenosis.  Left Carotid: Velocities in the left ICA are consistent with a 1-39%  stenosis.  Vertebrals: Bilateral vertebral arteries demonstrate antegrade flow.  Subclavians: Normal flow hemodynamics were seen in bilateral subclavian        arteries.    Neuromuscular disease    GI/Hepatic Neg liver ROS, GERD  Medicated,  Endo/Other  diabetes, Type 2  Renal/GU CRFRenal disease  negative genitourinary   Musculoskeletal  (+) Arthritis , Osteoarthritis,    Abdominal   Peds  Hematology  (+) Blood dyscrasia, anemia ,   Anesthesia Other Findings   Reproductive/Obstetrics negative OB ROS                           Anesthesia Physical Anesthesia Plan  ASA: 4 and emergent  Anesthesia Plan: General   Post-op Pain Management:    Induction: Intravenous  PONV Risk Score and Plan: 2 and Ondansetron, Dexamethasone and Treatment may vary due to age or medical condition  Airway Management Planned: LMA  Additional Equipment: None  Intra-op Plan:    Post-operative Plan: Extubation in OR  Informed Consent: I have reviewed the patients History and Physical, chart, labs and discussed the procedure including the risks, benefits and alternatives for the proposed anesthesia with the patient or authorized representative who has indicated his/her understanding and acceptance.     Dental Advisory Given  Plan Discussed with: CRNA and Anesthesiologist  Anesthesia Plan Comments: (See PAT note 08/16/2021 DISCUSSION:76 y.o. smoker with h/o schizophrenia, dementia, GERD, HTN, DM II, CKD Stage III, PVD (s/p left AKA, status post right SFA, popliteal artery stenting, peroneal artery angioplasty), carotid stenosis, PAF, CHF (EF 35-40%), gangrene right foot scheduled for above procedure 08/17/2021 with Lanae Crumbly, DPM.  Pt with recent admission 07/20/2021-07/25/2021. Hemoglobin 6.8, likely secondary to progressive kidney disease.  Transfused with 1 unit PRBC. Hemoglobin 9.0 at discharge.  Pt last seen by cardiology 07/14/2021.  H&P from PCP received which states pt is cleared for surgery with moderate risk.   Echo 07/04/2021 1. Left ventricular ejection fraction, by estimation, is 35 to 40%. The  left ventricle has moderately decreased function. The left ventricle  demonstrates global hypokinesis. Left ventricular diastolic parameters are  consistent with Grade I diastolic  dysfunction (impaired relaxation).  2. Right ventricular systolic function is normal. The right ventricular  size is normal. There is moderately elevated pulmonary artery systolic  pressure. The estimated right ventricular systolic pressure is 16.1 mmHg.  3. The mitral valve is grossly normal. Trivial mitral valve  regurgitation. No evidence of mitral stenosis.  4. The aortic valve is tricuspid. There is mild calcification  of the  aortic valve. Aortic valve regurgitation is not visualized. Aortic valve  sclerosis is present, with no evidence of aortic valve stenosis.  5.  The inferior vena cava is dilated in size with >50% respiratory  variability, suggesting right atrial pressure of 8 mmHg.  Past Medical History:  )       Anesthesia Quick Evaluation

## 2021-08-16 NOTE — Patient Instructions (Signed)
Visit Information  Thank you for taking time to visit with me today. Please don't hesitate to contact me if I can be of assistance to you.   Following are the goals we discussed today:   Goals Addressed               This Visit's Progress     Patient Stated     Decrease inpatient admissions/ readmissions with in the next 6 months([facility]) (pt-stated)   Not on track     Start date 08/16/21 Barriers: PTSD, Dementia, non compliance with diet, previous amputation    Care Coordination Interventions: Provided education to patient about basic DM disease process Reviewed medications with patient and discussed importance of medication adherence Counseled on importance of regular laboratory monitoring as prescribed Discussed plans with patient for ongoing care management follow up and provided patient with direct contact information for care management team Review of patient status, including review of consultants reports, relevant laboratory and other test results, and medications completed Screening for signs and symptoms of depression related to chronic disease state  Assessed social determinant of health barriers        Our next appointment is by telephone on 08/21/21 at 1 pm  Please call the care guide team at [phone] if you need to cancel or reschedule your appointment.   If you are experiencing a Mental Health or [place] or need someone to talk to, please call the Suicide and Crisis Lifeline: 988 call the [facility]: [phone]   Patient verbalizes understanding of instructions and care plan provided today and agrees to view in [place]. Active MyChart status and patient understanding of how to access instructions and care plan via MyChart confirmed with patient.       Jobeth Pangilinan L. [name], RN, BSN, [place] Coordinator Office number [phone] Main [facility] number [phone] Fax number [phone]

## 2021-08-17 ENCOUNTER — Ambulatory Visit (HOSPITAL_COMMUNITY): Payer: Medicare Other | Admitting: Physician Assistant

## 2021-08-17 ENCOUNTER — Ambulatory Visit (HOSPITAL_BASED_OUTPATIENT_CLINIC_OR_DEPARTMENT_OTHER): Payer: Medicare Other | Admitting: Physician Assistant

## 2021-08-17 ENCOUNTER — Encounter (HOSPITAL_COMMUNITY)
Admission: RE | Disposition: A | Discharge status: Home / Self Care | Payer: Self-pay | Source: Home / Self Care | Attending: Podiatry

## 2021-08-17 ENCOUNTER — Other Ambulatory Visit: Payer: Self-pay

## 2021-08-17 ENCOUNTER — Ambulatory Visit (HOSPITAL_COMMUNITY)
Admission: RE | Admit: 2021-08-17 | Discharge: 2021-08-17 | Disposition: A | Discharge status: Home / Self Care | Payer: Medicare Other | Attending: Podiatry | Admitting: Podiatry

## 2021-08-17 ENCOUNTER — Encounter (HOSPITAL_COMMUNITY): Payer: Self-pay | Admitting: Podiatry

## 2021-08-17 ENCOUNTER — Ambulatory Visit (HOSPITAL_COMMUNITY): Payer: TRICARE For Life (TFL)

## 2021-08-17 DIAGNOSIS — E11621 Type 2 diabetes mellitus with foot ulcer: Secondary | ICD-10-CM

## 2021-08-17 DIAGNOSIS — F419 Anxiety disorder, unspecified: Secondary | ICD-10-CM | POA: Diagnosis not present

## 2021-08-17 DIAGNOSIS — L97819 Non-pressure chronic ulcer of other part of right lower leg with unspecified severity: Secondary | ICD-10-CM | POA: Insufficient documentation

## 2021-08-17 DIAGNOSIS — D631 Anemia in chronic kidney disease: Secondary | ICD-10-CM | POA: Insufficient documentation

## 2021-08-17 DIAGNOSIS — E1151 Type 2 diabetes mellitus with diabetic peripheral angiopathy without gangrene: Secondary | ICD-10-CM | POA: Diagnosis not present

## 2021-08-17 DIAGNOSIS — F209 Schizophrenia, unspecified: Secondary | ICD-10-CM | POA: Diagnosis not present

## 2021-08-17 DIAGNOSIS — E1169 Type 2 diabetes mellitus with other specified complication: Secondary | ICD-10-CM | POA: Insufficient documentation

## 2021-08-17 DIAGNOSIS — Z794 Long term (current) use of insulin: Secondary | ICD-10-CM

## 2021-08-17 DIAGNOSIS — I96 Gangrene, not elsewhere classified: Secondary | ICD-10-CM

## 2021-08-17 DIAGNOSIS — L97519 Non-pressure chronic ulcer of other part of right foot with unspecified severity: Secondary | ICD-10-CM | POA: Diagnosis not present

## 2021-08-17 DIAGNOSIS — M86171 Other acute osteomyelitis, right ankle and foot: Secondary | ICD-10-CM | POA: Insufficient documentation

## 2021-08-17 DIAGNOSIS — F0390 Unspecified dementia without behavioral disturbance: Secondary | ICD-10-CM | POA: Insufficient documentation

## 2021-08-17 DIAGNOSIS — E1122 Type 2 diabetes mellitus with diabetic chronic kidney disease: Secondary | ICD-10-CM | POA: Diagnosis not present

## 2021-08-17 DIAGNOSIS — Z95820 Peripheral vascular angioplasty status with implants and grafts: Secondary | ICD-10-CM | POA: Diagnosis not present

## 2021-08-17 DIAGNOSIS — L089 Local infection of the skin and subcutaneous tissue, unspecified: Secondary | ICD-10-CM | POA: Diagnosis not present

## 2021-08-17 DIAGNOSIS — E1152 Type 2 diabetes mellitus with diabetic peripheral angiopathy with gangrene: Secondary | ICD-10-CM | POA: Insufficient documentation

## 2021-08-17 DIAGNOSIS — M1990 Unspecified osteoarthritis, unspecified site: Secondary | ICD-10-CM

## 2021-08-17 DIAGNOSIS — I130 Hypertensive heart and chronic kidney disease with heart failure and stage 1 through stage 4 chronic kidney disease, or unspecified chronic kidney disease: Secondary | ICD-10-CM | POA: Insufficient documentation

## 2021-08-17 DIAGNOSIS — I6529 Occlusion and stenosis of unspecified carotid artery: Secondary | ICD-10-CM | POA: Diagnosis not present

## 2021-08-17 DIAGNOSIS — Z89611 Acquired absence of right leg above knee: Secondary | ICD-10-CM | POA: Diagnosis not present

## 2021-08-17 DIAGNOSIS — K219 Gastro-esophageal reflux disease without esophagitis: Secondary | ICD-10-CM | POA: Insufficient documentation

## 2021-08-17 DIAGNOSIS — L97916 Non-pressure chronic ulcer of unspecified part of right lower leg with bone involvement without evidence of necrosis: Secondary | ICD-10-CM | POA: Diagnosis not present

## 2021-08-17 DIAGNOSIS — N183 Chronic kidney disease, stage 3 unspecified: Secondary | ICD-10-CM | POA: Diagnosis not present

## 2021-08-17 DIAGNOSIS — E11622 Type 2 diabetes mellitus with other skin ulcer: Secondary | ICD-10-CM | POA: Diagnosis not present

## 2021-08-17 DIAGNOSIS — I5022 Chronic systolic (congestive) heart failure: Secondary | ICD-10-CM | POA: Diagnosis not present

## 2021-08-17 HISTORY — PX: TRANSMETATARSAL AMPUTATION: SHX6197

## 2021-08-17 HISTORY — PX: IRRIGATION AND DEBRIDEMENT FOOT: SHX6602

## 2021-08-17 LAB — GLUCOSE, CAPILLARY
Glucose-Capillary: 140 mg/dL — ABNORMAL HIGH (ref 70–99)
Glucose-Capillary: 97 mg/dL (ref 70–99)

## 2021-08-17 SURGERY — AMPUTATION, FOOT, TRANSMETATARSAL
Anesthesia: General | Site: Leg Lower | Laterality: Right

## 2021-08-17 MED ORDER — PHENYLEPHRINE 80 MCG/ML (10ML) SYRINGE FOR IV PUSH (FOR BLOOD PRESSURE SUPPORT)
PREFILLED_SYRINGE | INTRAVENOUS | Status: DC | PRN
  Administered 2021-08-17: 80 ug via INTRAVENOUS
  Administered 2021-08-17: 120 ug via INTRAVENOUS

## 2021-08-17 MED ORDER — DOXYCYCLINE MONOHYDRATE 100 MG PO TABS: 100.0000 mg | ORAL_TABLET | Freq: Two times a day (BID) | ORAL | 0 refills | Status: DC

## 2021-08-17 MED ORDER — ONDANSETRON HCL 4 MG/2ML IJ SOLN: 4.0000 mg | Freq: Once | INTRAMUSCULAR | Status: DC | PRN

## 2021-08-17 MED ORDER — ACETAMINOPHEN 325 MG PO TABS: 325.0000 mg | ORAL_TABLET | ORAL | Status: DC | PRN

## 2021-08-17 MED ORDER — ONDANSETRON HCL 4 MG/2ML IJ SOLN
INTRAMUSCULAR | Status: DC | PRN
  Administered 2021-08-17: 4 mg via INTRAVENOUS

## 2021-08-17 MED ORDER — MEPERIDINE HCL 50 MG/ML IJ SOLN: 6.2500 mg | INTRAMUSCULAR | Status: DC | PRN

## 2021-08-17 MED ORDER — CHLORHEXIDINE GLUCONATE 0.12 % MT SOLN
15.0000 mL | Freq: Once | OROMUCOSAL | Status: AC
  Administered 2021-08-17: 15 mL via OROMUCOSAL

## 2021-08-17 MED ORDER — OXYCODONE HCL 5 MG/5ML PO SOLN: 5.0000 mg | Freq: Once | ORAL | Status: DC | PRN

## 2021-08-17 MED ORDER — PROPOFOL 10 MG/ML IV BOLUS
INTRAVENOUS | Status: DC | PRN
  Administered 2021-08-17: 50 mg via INTRAVENOUS
  Administered 2021-08-17: 30 mg via INTRAVENOUS

## 2021-08-17 MED ORDER — 0.9 % SODIUM CHLORIDE (POUR BTL) OPTIME
TOPICAL | Status: DC | PRN
  Administered 2021-08-17: 1000 mL

## 2021-08-17 MED ORDER — BUPIVACAINE HCL (PF) 0.5 % IJ SOLN
INTRAMUSCULAR | Status: AC
  Filled 2021-08-17: qty 30

## 2021-08-17 MED ORDER — OXYCODONE HCL 5 MG PO TABS: 5.0000 mg | ORAL_TABLET | Freq: Once | ORAL | Status: DC | PRN

## 2021-08-17 MED ORDER — OXYCODONE-ACETAMINOPHEN 5-325 MG PO TABS: 1.0000 | ORAL_TABLET | ORAL | 0 refills | Status: DC | PRN

## 2021-08-17 MED ORDER — PHENYLEPHRINE HCL-NACL 20-0.9 MG/250ML-% IV SOLN
INTRAVENOUS | Status: DC | PRN
  Administered 2021-08-17: 40 ug/min via INTRAVENOUS

## 2021-08-17 MED ORDER — BUPIVACAINE HCL (PF) 0.5 % IJ SOLN
INTRAMUSCULAR | Status: DC | PRN
  Administered 2021-08-17: 30 mL

## 2021-08-17 MED ORDER — VANCOMYCIN HCL 1 G IV SOLR
INTRAVENOUS | Status: DC | PRN
  Administered 2021-08-17: 1000 mg

## 2021-08-17 MED ORDER — ONDANSETRON HCL 4 MG/2ML IJ SOLN
INTRAMUSCULAR | Status: AC
  Filled 2021-08-17: qty 2

## 2021-08-17 MED ORDER — VANCOMYCIN HCL 1000 MG IV SOLR
INTRAVENOUS | Status: AC
  Filled 2021-08-17: qty 20

## 2021-08-17 MED ORDER — PROPOFOL 10 MG/ML IV BOLUS
INTRAVENOUS | Status: AC
  Filled 2021-08-17: qty 20

## 2021-08-17 MED ORDER — ACETAMINOPHEN 160 MG/5ML PO SOLN: 325.0000 mg | ORAL | Status: DC | PRN

## 2021-08-17 MED ORDER — FENTANYL CITRATE PF 50 MCG/ML IJ SOSY: 25.0000 ug | PREFILLED_SYRINGE | INTRAMUSCULAR | Status: DC | PRN

## 2021-08-17 MED ORDER — CEFAZOLIN SODIUM-DEXTROSE 2-4 GM/100ML-% IV SOLN
2.0000 g | INTRAVENOUS | Status: AC
  Administered 2021-08-17: 2 g via INTRAVENOUS
  Filled 2021-08-17: qty 100

## 2021-08-17 MED ORDER — LIDOCAINE 2% (20 MG/ML) 5 ML SYRINGE
INTRAMUSCULAR | Status: DC | PRN
  Administered 2021-08-17: 100 mg via INTRAVENOUS

## 2021-08-17 MED ORDER — LACTATED RINGERS IV SOLN: INTRAVENOUS | Status: DC

## 2021-08-17 SURGICAL SUPPLY — 87 items
APL PRP STRL LF DISP 70% ISPRP (MISCELLANEOUS) ×4
BAG COUNTER SPONGE SURGICOUNT (BAG) ×3 IMPLANT
BAG SPNG CNTER NS LX DISP (BAG) ×2
BLADE AVERAGE 25X9 (BLADE) ×1 IMPLANT
BLADE SURG 15 STRL LF DISP TIS (BLADE) ×2 IMPLANT
BLADE SURG 15 STRL SS (BLADE) ×3
BNDG CMPR 9X4 STRL LF SNTH (GAUZE/BANDAGES/DRESSINGS)
BNDG CMPR MED 10X6 ELC LF (GAUZE/BANDAGES/DRESSINGS) ×2
BNDG CONFORM 4 STRL LF (GAUZE/BANDAGES/DRESSINGS) ×3 IMPLANT
BNDG ELASTIC 3X5.8 VLCR STR LF (GAUZE/BANDAGES/DRESSINGS) ×3 IMPLANT
BNDG ELASTIC 4X5.8 VLCR STR LF (GAUZE/BANDAGES/DRESSINGS) ×3 IMPLANT
BNDG ELASTIC 6X10 VLCR STRL LF (GAUZE/BANDAGES/DRESSINGS) ×1 IMPLANT
BNDG ELASTIC 6X5.8 VLCR STR LF (GAUZE/BANDAGES/DRESSINGS) ×3 IMPLANT
BNDG ESMARK 4X9 LF (GAUZE/BANDAGES/DRESSINGS) IMPLANT
BNDG GAUZE DERMACEA FLUFF (GAUZE/BANDAGES/DRESSINGS) ×1
BNDG GAUZE DERMACEA FLUFF 4 (GAUZE/BANDAGES/DRESSINGS) ×2 IMPLANT
BNDG GZE DERMACEA 4 6PLY (GAUZE/BANDAGES/DRESSINGS) ×2
CHLORAPREP W/TINT 26 (MISCELLANEOUS) ×2 IMPLANT
CLEANER TIP ELECTROSURG 2X2 (MISCELLANEOUS) ×1 IMPLANT
CNTNR URN SCR LID CUP LEK RST (MISCELLANEOUS) IMPLANT
CONT SPEC 4OZ STRL OR WHT (MISCELLANEOUS)
COVER BACK TABLE 60X90IN (DRAPES) ×3 IMPLANT
COVER SURGICAL LIGHT HANDLE (MISCELLANEOUS) ×3 IMPLANT
CUFF TOURN SGL QUICK 18X4 (TOURNIQUET CUFF) IMPLANT
CUFF TOURN SGL QUICK 24 (TOURNIQUET CUFF)
CUFF TRNQT CYL 24X4X16.5-23 (TOURNIQUET CUFF) IMPLANT
DRAPE 3/4 80X56 (DRAPES) ×3 IMPLANT
DRAPE EXTREMITY T 121X128X90 (DISPOSABLE) ×3 IMPLANT
DRAPE SHEET LG 3/4 BI-LAMINATE (DRAPES) ×3 IMPLANT
DRAPE U-SHAPE 47X51 STRL (DRAPES) ×3 IMPLANT
DRESSING PEEL AND PLAC PRVNA20 (GAUZE/BANDAGES/DRESSINGS) IMPLANT
DRSG ADAPTIC 3X8 NADH LF (GAUZE/BANDAGES/DRESSINGS) ×1 IMPLANT
DRSG PEEL AND PLACE PREVENA 20 (GAUZE/BANDAGES/DRESSINGS) ×3
ELECT REM PT RETURN 15FT ADLT (MISCELLANEOUS) ×3 IMPLANT
GAUZE SPONGE 4X4 12PLY STRL (GAUZE/BANDAGES/DRESSINGS) ×3 IMPLANT
GAUZE XEROFORM 1X8 LF (GAUZE/BANDAGES/DRESSINGS) ×3 IMPLANT
GLOVE BIO SURGEON STRL SZ7.5 (GLOVE) ×3 IMPLANT
GLOVE BIOGEL PI IND STRL 7.5 (GLOVE) ×2 IMPLANT
GLOVE BIOGEL PI IND STRL 8 (GLOVE) ×2 IMPLANT
GLOVE BIOGEL PI INDICATOR 7.5 (GLOVE) ×1
GLOVE BIOGEL PI INDICATOR 8 (GLOVE) ×1
GLOVE ECLIPSE 8.0 STRL XLNG CF (GLOVE) ×3 IMPLANT
GLOVE SURG ENC TEXT LTX SZ7 (GLOVE) ×3 IMPLANT
GOWN STRL REUS W/ TWL XL LVL3 (GOWN DISPOSABLE) ×2 IMPLANT
GOWN STRL REUS W/TWL XL LVL3 (GOWN DISPOSABLE) ×3
GRAFT MYRIAD 3 LAYER 7X10 (Graft) ×1 IMPLANT
KIT BASIN OR (CUSTOM PROCEDURE TRAY) ×3 IMPLANT
KIT DRSG PREVENA PLUS 7DAY 125 (MISCELLANEOUS) ×1 IMPLANT
KIT PREVENA INCISION MGT 13 (CANNISTER) ×1 IMPLANT
KIT TURNOVER KIT A (KITS) IMPLANT
MANIFOLD NEPTUNE II (INSTRUMENTS) ×3 IMPLANT
NDL HYPO 25X1 1.5 SAFETY (NEEDLE) ×2 IMPLANT
NEEDLE HYPO 25X1 1.5 SAFETY (NEEDLE) ×3 IMPLANT
NS IRRIG 1000ML POUR BTL (IV SOLUTION) IMPLANT
PACK ORTHO EXTREMITY (CUSTOM PROCEDURE TRAY) ×3 IMPLANT
PADDING CAST ABS 4INX4YD NS (CAST SUPPLIES) ×1
PADDING CAST ABS COTTON 4X4 ST (CAST SUPPLIES) ×2 IMPLANT
PADDING UNDERCAST 2 STRL (CAST SUPPLIES) ×1
PADDING UNDERCAST 2X4 STRL (CAST SUPPLIES) ×2 IMPLANT
PENCIL SMOKE EVACUATOR (MISCELLANEOUS) ×3 IMPLANT
SET IRRIG Y TYPE TUR BLADDER L (SET/KITS/TRAYS/PACK) IMPLANT
SPIKE FLUID TRANSFER (MISCELLANEOUS) IMPLANT
SPLINT PLASTER EXTRA FAST 3X15 (CAST SUPPLIES) ×1
SPLINT PLASTER GYPS XFAST 3X15 (CAST SUPPLIES) IMPLANT
SPONGE T-LAP 4X18 ~~LOC~~+RFID (SPONGE) ×3 IMPLANT
STAPLER VISISTAT 35W (STAPLE) ×4 IMPLANT
STOCKINETTE 6  STRL (DRAPES) ×3
STOCKINETTE 6 STRL (DRAPES) ×2 IMPLANT
SUCTION FRAZIER HANDLE 10FR (MISCELLANEOUS)
SUCTION TUBE FRAZIER 10FR DISP (MISCELLANEOUS) IMPLANT
SUT ETHILON 2 0 PS N (SUTURE) ×2 IMPLANT
SUT ETHILON 3 0 PS 1 (SUTURE) IMPLANT
SUT ETHILON 4 0 PS 2 18 (SUTURE) ×3 IMPLANT
SUT MNCRL AB 3-0 PS2 18 (SUTURE) ×2 IMPLANT
SUT MNCRL AB 4-0 PS2 18 (SUTURE) IMPLANT
SUT PDS AB 2-0 CT2 27 (SUTURE) ×1 IMPLANT
SUT VIC AB 2-0 SH 27 (SUTURE)
SUT VIC AB 2-0 SH 27XBRD (SUTURE) IMPLANT
SUT VIC AB 4-0 PS2 27 (SUTURE) ×3 IMPLANT
SYR 20ML LL LF (SYRINGE) ×1 IMPLANT
SYR BULB EAR ULCER 3OZ GRN STR (SYRINGE) ×3 IMPLANT
SYR CONTROL 10ML LL (SYRINGE) ×3 IMPLANT
TOWEL OR 17X26 10 PK STRL BLUE (TOWEL DISPOSABLE) ×3 IMPLANT
TOWEL OR NON WOVEN STRL DISP B (DISPOSABLE) ×3 IMPLANT
TUBE IRRIGATION SET MISONIX (TUBING) IMPLANT
UNDERPAD 30X36 HEAVY ABSORB (UNDERPADS AND DIAPERS) ×6 IMPLANT
YANKAUER SUCT BULB TIP NO VENT (SUCTIONS) ×3 IMPLANT

## 2021-08-17 NOTE — Transfer of Care (Signed)
Immediate Anesthesia Transfer of Care Note  Patient: Allen Yu  Procedure(s) Performed: TRANSMETATARSAL AMPUTATION (Right: Foot) IRRIGATION AND DEBRIDEMENT ULCER RIGHT LEG APPLICATION SKIN SUBSTITUTE (Right: Leg Lower)  Patient Location: PACU  Anesthesia Type:General  Level of Consciousness: drowsy  Airway & Oxygen Therapy: Patient Spontanous Breathing and Patient connected to face mask oxygen  Post-op Assessment: Report given to RN and Post -op Vital signs reviewed and stable  Post vital signs: Reviewed and stable  Last Vitals:  Vitals Value Taken Time  BP 153/79 08/17/21 1750  Temp    Pulse 113 08/17/21 1751  Resp 19 08/17/21 1751  SpO2 100 % 08/17/21 1751  Vitals shown include unvalidated device data.  Last Pain:  Vitals:   08/17/21 1405  TempSrc: Oral         Complications: No notable events documented.

## 2021-08-17 NOTE — Brief Op Note (Signed)
08/17/2021  5:37 PM  PATIENT:  [name]  76 y.o. male  PRE-OPERATIVE DIAGNOSIS:  GANGRENE RIGHT FOOT ULCER OF RIGHT LEG  POST-OPERATIVE DIAGNOSIS:  GANGRENE RIGHT FOOT ULCER OF RIGHT LEG  PROCEDURE:  Procedure(s): TRANSMETATARSAL AMPUTATION (Right) IRRIGATION AND DEBRIDEMENT ULCER RIGHT LEG APPLICATION SKIN SUBSTITUTE (Right)  SURGEON:  Surgeon(s) and Role:    * Jaziya Obarr, Stephan Minister, DPM - Primary   ANESTHESIA:   general  EBL:  150 mL   BLOOD ADMINISTERED:none  DRAINS: Prevena VAC   LOCAL MEDICATIONS USED:  20cc Marcaine 0.5%  SPECIMEN:  right forefoot  DISPOSITION OF SPECIMEN:  PATHOLOGY  COUNTS:  YES  TOURNIQUET:  none  DICTATION: .Note written in EPIC  PLAN OF CARE: Discharge to home after PACU  PATIENT DISPOSITION:  PACU - hemodynamically stable.   Delay start of Pharmacological VTE agent (>24hrs) due to surgical blood loss or risk of bleeding: no

## 2021-08-17 NOTE — H&P (Signed)
History and Physical Interval Note:  08/17/2021 3:44 PM  Allen Yu  has presented today for surgery, with the diagnosis of gangrene of left foot.  The various methods of treatment have been discussed with the patient and family. After consideration of risks, benefits and other options for treatment, the patient has consented to   Procedure(s): TRANSMETATARSAL AMPUTATION (Right) IRRIGATION AND DEBRIDEMENT ULCER LEFT LEG (Left) as a surgical intervention.  The patient's history has been reviewed, patient examined, no change in status, stable for surgery.  I have reviewed the patient's chart and labs.  Questions were answered to the patient's satisfaction.     Criselda Peaches

## 2021-08-17 NOTE — Anesthesia Procedure Notes (Signed)
Procedure Name: LMA Insertion Date/Time: 08/17/2021 4:01 PM  Performed by: Milford Cage, CRNAPre-anesthesia Checklist: Patient identified, Emergency Drugs available, Suction available and Patient being monitored Patient Re-evaluated:Patient Re-evaluated prior to induction Oxygen Delivery Method: Circle system utilized Preoxygenation: Pre-oxygenation with 100% oxygen Induction Type: IV induction Ventilation: Mask ventilation without difficulty LMA: LMA with gastric port inserted LMA Size: 5.0 Number of attempts: 1 Tube secured with: Tape Dental Injury: Teeth and Oropharynx as per pre-operative assessment

## 2021-08-17 NOTE — Anesthesia Postprocedure Evaluation (Signed)
Anesthesia Post Note  Patient: EVERETT EHRLER  Procedure(s) Performed: TRANSMETATARSAL AMPUTATION (Right: Foot) IRRIGATION AND DEBRIDEMENT ULCER RIGHT LEG APPLICATION SKIN SUBSTITUTE (Right: Leg Lower)     Patient location during evaluation: PACU Anesthesia Type: General Level of consciousness: awake and alert Pain management: pain level controlled Vital Signs Assessment: post-procedure vital signs reviewed and stable Respiratory status: spontaneous breathing, nonlabored ventilation, respiratory function stable and patient connected to nasal cannula oxygen Cardiovascular status: blood pressure returned to baseline and stable Postop Assessment: no apparent nausea or vomiting Anesthetic complications: no   No notable events documented.  Last Vitals:  Vitals:   08/17/21 1405 08/17/21 1750  BP: 126/81 (!) 153/79  Pulse: (!) 115 (!) 112  Resp: 15 16  Temp: 36.7 C 36.5 C  SpO2:  100%    Last Pain:  Vitals:   08/17/21 1750  TempSrc:   PainSc: 0-No pain                 Hjalmer Iovino

## 2021-08-17 NOTE — Discharge Instructions (Addendum)
Post-Surgery Instructions  1. If you are recuperating from surgery anywhere other than home, please be sure to leave [place] a number where you can be reached. 2. Go directly home and rest. 3. The keep operated foot (or feet) elevated six inches above the hip when sitting or lying down. 4. Support the elevated foot and leg with pillows under the calf. DO NOT PLACE PILLOWS UNDER THE KNEE. 5. DO NOT REMOVE or get your bandages wet. This will increase your chances of getting an infection. 6. Wear your surgical shoe at all times when you are up. 7. A limited amount of pain and swelling may occur. The skin may take on a bruised appearance. This is no cause for alarm. 8. For slight pain and swelling, apply an ice pack directly over the bandage for 15 minutes every hour. Continue icing until seen in the office. DO NOT apply any form of heat to the area. 9. Have prescription(s) filled immediately and take as directed. 10. Drink lots of liquids, water, and juice. 11. CALL THE OFFICE IMMEDIATELY IF: a. Bleeding continues b. Pain increases and/or does not respond to medication c. Bandage or cast appears too tight d. Any liquids (water, coffee, etc.) have spilled on your bandages. e. Tripping, falling, or stubbing the surgical foot f. If your temperature rises above 101 g. If you have ANY questions at all 12. Please use the crutches, knee scooter, or walker you have prescribed, rented, or purchased. If you are non-weight bearing DO NOT put weight on the operated foot for _________ days. If you are weight-bearing, follow your physician's instructions. You are expected to be: ?Can use heel to transfer 13. Special Instructions: _____________________________________________________________ _________________________________________________________________________________ _________________________________________________________________________________   If you need to reach the nurse for any reason,  please call: [place]: [phone] [place]: [phone] [place]: [phone]

## 2021-08-18 ENCOUNTER — Encounter (HOSPITAL_COMMUNITY): Payer: Self-pay | Admitting: Podiatry

## 2021-08-19 DIAGNOSIS — L97916 Non-pressure chronic ulcer of unspecified part of right lower leg with bone involvement without evidence of necrosis: Secondary | ICD-10-CM

## 2021-08-19 NOTE — Op Note (Signed)
Patient Name: Allen Yu DOB: [birth year]  MRN: [id number]   Date of Service: 08/17/2021  Surgeon: Dr. Lanae Crumbly, DPM Assistants: None Pre-operative Diagnosis:  Peripheral arterial disease Gangrene of right foot Ulcer of right leg Post-operative Diagnosis:  Peripheral arterial disease Gangrene of right foot Ulcer of right leg Procedures:  1) transmetatarsal amputation right foot  2) preparation of wound bed for skin substitute recipient site 45 cm  3) application of skin substitute 45 cm Pathology/Specimens: ID Type Source Tests Collected by Time Destination  1 : Right fore foot Tissue PATH Other SURGICAL PATHOLOGY Criselda Peaches, DPM 08/17/2021 1637    Anesthesia: General Hemostasis: No tourniquet was used Estimated Blood Loss: 150 mL Materials:  Implant Name Type Inv. Item Serial No. Manufacturer Lot No. LRB No. Used Action  GRAFT MYRIAD 3 LAYER [id number] - [id number] Graft GRAFT MYRIAD 3 LAYER [id number]  [employer] [id number] Right 1 Implanted   Medications: 20 cc Marcaine 7.8% plain Complications: No complication noted  Indications for Procedure:  This is a 76 y.o. male with a history of type 2 diabetes and severe peripheral arterial disease.  He developed gangrene of his right forefoot.  He previously underwent revascularization with vascular surgery.  Amputation was recommended for the gangrene.  He had a large ulceration as well on the anterior leg.  Debridement and application of a skin substitute was recommended for this   Procedure in Detail: Patient was identified in pre-operative holding area. Formal consent was signed and the right lower extremity was marked. Patient was brought back to the operating room. Anesthesia was induced. The extremity was prepped and draped in the usual sterile fashion. Timeout was taken to confirm patient name, laterality, and procedure prior to incision.   Attention was then directed to the right lower extremity which  exhibited gangrene of the first second and third toes.  A fishmouth style incision was created.  Dissection was carried deep through subcutaneous tissue and muscle layers to the level of the metatarsals.  Cautery was used for hemostasis.  Perfusion was excellent.  Transmetatarsal amputations of the first second third fourth and fifth metatarsals was completed in standard orientation.  The forefoot was removed and sent for pathologic analysis.  The wound was thoroughly irrigated with 3 L normal sterile saline with a pulse irrigator.  Once this was complete, hemostasis was achieved.  The wound was closed in layers with the soft tissue flaps with good coverage over the bone with 2-0 PDS, 3-0 Monocryl, 3-0 nylon and skin staples.  A Prevena negative pressure wound therapy VAC was then applied as an incisional VAC.  I then directed my attention to the anterior lower leg which had a large necrotic ulceration eschar measuring 15 x 3 cm.  This penetrated down to the level of periosteum.  The eschar and nonviable tissue was debrided completely.  The wound was thoroughly irrigated.  Once all nonviable tissue had been removed a Myriad Matrix skin substitute was applied to the ulceration.  It was secured with skin staples.  Surgical lube and Adaptic was applied and secured to this.  The remainder of the foot was then dressed with ABD pad 4 x 4 and Kerlix.  An Ace wrap with no compression was applied.  He was then aroused from anesthesia.  Patient tolerated the procedure well.   Disposition: Following a period of post-operative monitoring, patient will be transferred to home.

## 2021-08-21 ENCOUNTER — Ambulatory Visit: Payer: Self-pay | Admitting: *Deleted

## 2021-08-21 ENCOUNTER — Encounter: Payer: Self-pay | Admitting: *Deleted

## 2021-08-21 LAB — SURGICAL PATHOLOGY

## 2021-08-21 NOTE — Patient Instructions (Addendum)
Visit Information  Thank you for taking time to visit with me today. Please don't hesitate to contact me if I can be of assistance to you.   Following are the goals we discussed today:   Goals Addressed               This Visit's Progress     Patient Stated     Decrease inpatient admissions/ readmissions with in the next 6 months([facility]) (pt-stated)   On track     Start date 08/16/21 Barriers: PTSD, Dementia, non compliance with diet, previous amputation  08/17/21 right foot amputation - doing well at home with wife and [facility] care   Care Coordination Interventions: Provided education to patient about basic DM disease process Reviewed medications with patient and discussed importance of medication adherence Counseled on importance of regular laboratory monitoring as prescribed Discussed plans with patient for ongoing care management follow up and provided patient with direct contact information for care management team Review of patient status, including review of consultants reports, relevant laboratory and other test results, and medications completed Screening for signs and symptoms of depression related to chronic disease state  Assessed social determinant of health barriers 08/17/21 TOC services, cbg hypoglycemic management discussed, Encouragement provided        Our next appointment is by telephone on 08/28/21 at 3 pm  Please call the care guide team at [phone] if you need to cancel or reschedule your appointment.   If you are experiencing a Mental Health or [place] or need someone to talk to, please call the [facility]: [phone]   Patient verbalizes understanding of instructions and care plan provided today and agrees to view in [place]. Active MyChart status and patient understanding of how to access instructions and care plan via MyChart confirmed with patient.     The patient has been provided with contact information for  the care management team and has been advised to call with any health related questions or concerns.   Caylin Nass L. [name], RN, BSN, [place] Coordinator Office number [phone] Main [facility] number [phone] Fax number [phone]

## 2021-08-21 NOTE — Patient Outreach (Signed)
  Care Coordination TOC Note Transition Care Management Follow-up Telephone Call- week 1 after 08/17/21  Date of discharge and from where: 08/17/21 s/p transmetatarsal amputation of right foot (Dr Lanae Crumbly) dx PAD, gangrene of right foot, ulcer of right leg - [facility]  How have you been since you were released from the hospital? good Any questions or concerns? No  Items Reviewed: Did the pt receive and understand the discharge instructions provided? Yes  Medications obtained and verified? Yes  Other? No  Any new allergies since your discharge? No  Dietary orders reviewed? Yes Do you have support at home? Yes   Home Care and Equipment/Supplies: Were home health services ordered? yes If so, [facility]  Has the agency set up a time to come to the patient's home? yes Were any new equipment or medical supplies ordered?  No What [facility] the [facility] Were you able to get the supplies/equipment? Yes- wound vac Do you have any questions related to the use of the equipment or supplies? No  Functional Questionnaire: (I = Independent and D = Dependent) ADLs: D  Bathing/Dressing- D  Meal Prep- D  Eating- I  Maintaining continence- D  Transferring/Ambulation- D  Managing Meds- D  Follow up appointments reviewed:  PCP Hospital f/u appt confirmed? Yes  Scheduled to see Dr Willey Blade on @  [facility] f/u appt confirmed? Yes  Scheduled to see Dr Sherryle Lis on 08/24/21 @ 1545. Are transportation arrangements needed? No  If their condition worsens, is the pt aware to call PCP or go to the Emergency Dept.? Yes Was the patient provided with contact information for the PCP's office or ED? Yes Was to pt encouraged to call back with questions or concerns? Yes  SDOH assessments and interventions completed:   Yes  Care Coordination Interventions Activated:  Yes Care Coordination Interventions:   Reviewed  admission, discharge instructions, assessed for needs,  Allowed wife time to ventilate her feelings. Provided support and encouragement to [name] as she continues to support her husband and household with upmost dignity [name] reports she is okay. Continues with support of her twin grandchildren. Minimal support from her family nor the pt's family. Support from the [place] staff. Encouraged to outreach to RN CM whenever needed   Reviewed follow up visits- Dr Sherryle Lis, podiatry. Thursday 08/24/21 1545 for fu on foot wound vac  Wednesday 08/23/21 11 am 1 hour [facility] RN visit from [place] then at 12 a visit from [place], [place]  08/29/21 dietitian visit   No c/o from pt Tolerating oxycodone and tramadol plus UTI medicine Wife is providing his medicine at night now and this is allowing him to sleep well throughout the night She encouraged him to get up & get dress today when he wanted to stay in bed  Diabetes Cbg 63- 53 when drop  Discussed hypoglycemia management to include -oj sugar for immediate increase but then protein to include peanut butter (PB)/protein bar, meat for longer lasting increase. He like town house crackers PB. Wife voiced understanding and appreciation Today cbg value in low 100s    Pt still would like to be rid of foley catheter Wife aware of need to continue foley for urinary retention   Encounter Outcome:  Pt. Visit Completed  Ahmet Schank L. [name], RN, BSN, [place] Coordinator Office number [phone] [facility] number [phone] Fax number [phone]

## 2021-08-24 ENCOUNTER — Ambulatory Visit (INDEPENDENT_AMBULATORY_CARE_PROVIDER_SITE_OTHER): Payer: Medicare Other | Admitting: Podiatry

## 2021-08-24 DIAGNOSIS — E1165 Type 2 diabetes mellitus with hyperglycemia: Secondary | ICD-10-CM

## 2021-08-24 DIAGNOSIS — I739 Peripheral vascular disease, unspecified: Secondary | ICD-10-CM

## 2021-08-24 DIAGNOSIS — I96 Gangrene, not elsewhere classified: Secondary | ICD-10-CM

## 2021-08-24 DIAGNOSIS — L97913 Non-pressure chronic ulcer of unspecified part of right lower leg with necrosis of muscle: Secondary | ICD-10-CM

## 2021-08-27 NOTE — Progress Notes (Signed)
  Subjective:  Patient ID: Allen Yu, male    DOB: Jun 19, 1945,  MRN: [id number]  Chief Complaint  Patient presents with   Routine Post Op    POV #1 DOS 08/17/2021 TRANSMETATARSAL AMPUTATION RT FOOT, DEBRIDEMENT OF LEFT LEG ULCER     76 y.o. male returns for post-op check.  Doing okay not having much pain  Review of Systems: Negative except as noted in the HPI. Denies N/V/F/Ch.   Objective:  There were no vitals filed for this visit. There is no height or weight on file to calculate BMI. Constitutional Well developed. Well nourished.  Vascular Foot warm and well perfused. Capillary refill normal to all digits.  Calf is soft and supple, no posterior calf or knee pain, negative Homans' sign  Neurologic Normal speech. Oriented to person, place, and time. Epicritic sensation to light touch grossly present bilaterally.  Dermatologic Skin healing well without signs of infection. Skin edges well coapted without signs of infection.  Orthopedic: Little to no tenderness to palpation noted about the surgical site.    Assessment:   1. Gangrene of right foot ([place])   2. Ulcer of right leg, with necrosis of muscle ([place])   3. Type 2 diabetes mellitus with hyperglycemia, unspecified whether long term insulin use ([place])   4. PAD (peripheral artery disease) ([place])    Plan:  Patient was evaluated and treated and all questions answered.  S/p foot surgery right -Progressing as expected post-operatively.  Appears to be healing well -WB Status: NWB in posterior splint -Sutures: Plan to remove in 2 weeks. -Medications: No refills required -Foot redressed.  Return in 1 week for dressing change  Return in about 1 week (around 08/31/2021).

## 2021-08-28 ENCOUNTER — Encounter: Payer: Self-pay | Admitting: *Deleted

## 2021-08-28 ENCOUNTER — Ambulatory Visit: Payer: Self-pay | Admitting: *Deleted

## 2021-08-28 NOTE — Patient Outreach (Signed)
  Care Coordination   Follow Up Visit Note   08/28/2021 Name: Allen Yu MRN: [id number] DOB: 1945-06-17  Allen Yu is a 76 y.o. year old male who sees Allen Noble, MD for primary care. I spoke with  Allen Yu by phone today  What matters to the patients health and wellness today?   TOC follow up week #2  Doing well, Remaining active/going out to appointments and shopping with wife, Reviewed follow up appointment, decrease appetite, foot pain, no nausea, fair sleep habits  Encourage review of best choice of foods with visiting dietitian   Goals Addressed               This Visit's Progress     Patient Stated     Decrease inpatient admissions/ readmissions within the next 6 months [facility]) (pt-stated)   On track     Start date 08/16/21 Barriers: PTSD, Dementia, non compliance with diet, previous amputation  08/17/21 right foot amputation - doing well at home with wife and [facility] care   Care Coordination Interventions: Provided education to patient about basic DM disease process Reviewed medications with patient and discussed importance of medication adherence Counseled on importance of regular laboratory monitoring as prescribed Discussed plans with patient for ongoing care management follow up and provided patient with direct contact information for care management team Reviewed scheduled/upcoming provider appointments including: pcp, [place] staff to include visiting RN, dietitian, podiatrist/foot surgeon Review of patient status, including review of consultants reports, relevant laboratory and other test results, and medications completed Screening for signs and symptoms of depression related to chronic disease state  Assessed social determinant of health barriers 08/17/21 TOC services, cbg hypoglycemic management discussed, Encouragement provided 08/28/21 TOC week assessment.  Encouraged monitoring for nausea, fever, excessive sleeping. Encouraged review of best choices  of foods with upcoming pending dietitian Sent online education materials on low sodium eating plan, living with an amputation, video" healthy living: sleep, video diabetes sick day management         SDOH assessments and interventions completed:  Yes  SDOH Interventions Today    Flowsheet Row Most Recent Value  SDOH Interventions   Food Insecurity Interventions Intervention Not Indicated  Social Connections Interventions Intervention Not Indicated  Transportation Interventions Intervention Not Indicated        Care Coordination Interventions Activated:  Yes  Care Coordination Interventions:  Yes, provided   Follow up plan: Follow up call scheduled for 09/04/21 3 pm     Encounter Outcome:  Pt. Visit Completed   Allen Yu L. [name], RN, BSN, [place] Coordinator Office number [phone]

## 2021-08-28 NOTE — Patient Instructions (Addendum)
Visit Information  Thank you for taking time to visit with me today. Please don't hesitate to contact me if I can be of assistance to you.   Following are the goals we discussed today:   Goals Addressed               This Visit's Progress     Patient Stated     Decrease inpatient admissions/ readmissions within the next 6 months [facility]) (pt-stated)   On track     Start date 08/16/21 Barriers: PTSD, Dementia, non compliance with diet, previous amputation  08/17/21 right foot amputation - doing well at home with wife and [facility] care   Care Coordination Interventions: Provided education to patient about basic DM disease process Reviewed medications with patient and discussed importance of medication adherence Counseled on importance of regular laboratory monitoring as prescribed Discussed plans with patient for ongoing care management follow up and provided patient with direct contact information for care management team Reviewed scheduled/upcoming provider appointments including: pcp, [place] staff to include visiting RN, dietitian, podiatrist/foot surgeon Review of patient status, including review of consultants reports, relevant laboratory and other test results, and medications completed Screening for signs and symptoms of depression related to chronic disease state  Assessed social determinant of health barriers 08/17/21 TOC services, cbg hypoglycemic management discussed, Encouragement provided 08/28/21 TOC week assessment.  Encouraged monitoring for nausea, fever, excessive sleeping. Encouraged review of best choices of foods with upcoming pending dietitian Sent online education materials on low sodium eating plan, living with an amputation, video" healthy living: sleep, video diabetes sick day management         Our next appointment is by telephone on 09/04/21 at 3 pm  Please call the care guide team at [phone] if you need to cancel or reschedule your appointment.   If  you are experiencing a Mental Health or [place] or need someone to talk to, please call the Suicide and Crisis Lifeline: 988 call the [place] National Suicide Prevention Lifeline: 681-151-4293 or TTY: (914)749-2814 TTY 307-197-7056) to talk to a trained counselor call 1-800-273-TALK (toll free, 24 hour hotline) call the [facility]: 440-824-7734 call 911   Patient verbalizes understanding of instructions and care plan provided today and agrees to view in [place]. Active MyChart status and patient understanding of how to access instructions and care plan via MyChart confirmed with patient.     The care management team will reach out to the patient again over the next 7-10 business days.   Sunshine Mackowski L. [name], RN, BSN, [place] Coordinator Office number [phone]

## 2021-08-31 ENCOUNTER — Ambulatory Visit (INDEPENDENT_AMBULATORY_CARE_PROVIDER_SITE_OTHER): Payer: Medicare Other | Admitting: Podiatry

## 2021-08-31 DIAGNOSIS — I96 Gangrene, not elsewhere classified: Secondary | ICD-10-CM

## 2021-08-31 DIAGNOSIS — I739 Peripheral vascular disease, unspecified: Secondary | ICD-10-CM

## 2021-08-31 MED ORDER — DOXYCYCLINE HYCLATE 100 MG PO TABS: 100.0000 mg | ORAL_TABLET | Freq: Two times a day (BID) | ORAL | 0 refills | Status: DC

## 2021-09-01 ENCOUNTER — Telehealth: Payer: Self-pay | Admitting: *Deleted

## 2021-09-01 ENCOUNTER — Encounter: Payer: TRICARE For Life (TFL) | Admitting: *Deleted

## 2021-09-01 NOTE — Patient Outreach (Signed)
  Care Coordination   Follow Up Visit Note   09/01/2021 Name: Allen Yu MRN: [id number] DOB: 1945-03-16  Allen Yu is a 76 y.o. year old male who sees [name], MD for primary care. I spoke with  [name] by phone today  What matters to the patients health and wellness today?  AWV Scheduling    Goals Addressed             This Visit's Progress    Schedule AWV       Care Coordination Interventions: Advised patient to talk with PCP's office regarding scheduling Annual Wellness Visit         SDOH assessments and interventions completed:  No     Care Coordination Interventions Activated:  Yes  Care Coordination Interventions:  Yes, provided   Follow up plan: Follow up call scheduled for 09/04/21 with [place] Coordinator will reach out to PCP office requesting that they schedule patient for an AWV    Encounter Outcome:  Pt. Visit Completed

## 2021-09-04 ENCOUNTER — Ambulatory Visit: Payer: Self-pay | Admitting: *Deleted

## 2021-09-04 ENCOUNTER — Encounter: Payer: Self-pay | Admitting: *Deleted

## 2021-09-04 NOTE — Telephone Encounter (Signed)
This encounter was created in error - please disregard.

## 2021-09-04 NOTE — Patient Outreach (Signed)
  Care Coordination   Follow Up Visit Note   09/05/2021 Name: Allen Yu MRN: [id number] DOB: Jun 05, 1945  Allen Yu is a 76 y.o. year old male who sees Allen Noble, MD for primary care. I spoke with  Allen Yu by phone today  What matters to the patients health and wellness today?  Decrease appetite & fluid intake, waking in middle of night screaming, pcp to order labs after Allen Yu called the office to report these concerns    Goals Addressed               This Visit's Progress     Patient Stated     COMPLETED: Decrease inpatient admissions/ readmissions within the next 6 months [facility]) (pt-stated)   On track     Start date 08/16/21 Barriers: PTSD, Dementia, non compliance with diet, previous amputation  08/17/21 right foot amputation - doing well at home with wife and [facility] services care   Care Coordination Interventions: Provided education to patient about basic DM disease process Reviewed medications with patient and discussed importance of medication adherence Counseled on importance of regular laboratory monitoring as prescribed Discussed plans with patient for ongoing care management follow up and provided patient with direct contact information for care management team Reviewed scheduled/upcoming provider appointments including: pcp, [place] staff to include visiting RN, dietitian, podiatrist/foot surgeon Review of patient status, including review of consultants reports, relevant laboratory and other test results, and medications completed Screening for signs and symptoms of depression related to chronic disease state  Assessed social determinant of health barriers 08/17/21 TOC services, cbg hypoglycemic management discussed, Encouragement provided 08/28/21 TOC week assessment.  Encouraged monitoring for nausea, fever, excessive sleeping. Encouraged review of best choices of foods with upcoming pending dietitian Sent online education materials on low sodium eating plan,  living with an amputation, video" healthy living: sleep, video diabetes sick day management 09/04/21 changing [facility] services/RN CM from complex care to segmented care services discussed.  Encouraged to outreach as needed to RN CM. Encouraged Allen Yu to check to see if patient is getting albumin lab as pt has lost 10 lbs in the last month. Sent education information via my chart for high protein meals/foods and albumin [email or web address] discussed Resolved goal as changing from complex care to segmented care RN CM         SDOH assessments and interventions completed:  Yes  SDOH Interventions Today    Flowsheet Row Most Recent Value  SDOH Interventions   Food Insecurity Interventions Intervention Not Indicated  Transportation Interventions Intervention Not Indicated  Depression Interventions/Treatment  Medication, Counseling, Currently on Treatment  [continues with medication, sees [facility] psych]        Care Coordination Interventions Activated:  Yes  Care Coordination Interventions:  Yes, provided   Follow up plan: No further intervention required.  Changing from complex care to segmented care services  Encounter Outcome:  Pt. Visit Completed   Allen Yu L. Allen Hamman, RN, BSN, [place] Coordinator Office number [phone]

## 2021-09-04 NOTE — Patient Instructions (Signed)
[email or web address]  Visit Information  Thank you for taking time to visit with me today. Please don't hesitate to contact me if I can be of assistance to you.   Following are the goals we discussed today:   Goals Addressed               This Visit's Progress     Patient Stated     Decrease inpatient admissions/ readmissions within the next 6 months [facility]) (pt-stated)        Start date 08/16/21 Barriers: PTSD, Dementia, non compliance with diet, previous amputation  08/17/21 right foot amputation - doing well at home with wife and [facility] services care   Care Coordination Interventions: Provided education to patient about basic DM disease process Reviewed medications with patient and discussed importance of medication adherence Counseled on importance of regular laboratory monitoring as prescribed Discussed plans with patient for ongoing care management follow up and provided patient with direct contact information for care management team Reviewed scheduled/upcoming provider appointments including: pcp, [place] staff to include visiting RN, dietitian, podiatrist/foot surgeon Review of patient status, including review of consultants reports, relevant laboratory and other test results, and medications completed Screening for signs and symptoms of depression related to chronic disease state  Assessed social determinant of health barriers 08/17/21 TOC services, cbg hypoglycemic management discussed, Encouragement provided 08/28/21 TOC week assessment.  Encouraged monitoring for nausea, fever, excessive sleeping. Encouraged review of best choices of foods with upcoming pending dietitian Sent online education materials on low sodium eating plan, living with an amputation, video" healthy living: sleep, video diabetes sick day management 09/04/21 changing [facility] services/RN CM from complex care to segmented care services discussed.  Encouraged to outreach as needed to RN CM.  Encouraged [name] to check to see if patient is getting albumin lab as pt has lost 10 lbs in the last month. Sent education information via my chart for high protein meals/foods        Our next appointment is by telephone on pending schedule of segmented RN CM  at pending  Please call the care guide team at [phone] if you need to cancel or reschedule your appointment.   If you are experiencing a Mental Health or [place] or need someone to talk to, please call the Suicide and Crisis Lifeline: 988 call the [place] National Suicide Prevention Lifeline: 2235571607 or TTY: 684-525-7271 TTY (343)609-9813) to talk to a trained counselor call 1-800-273-TALK (toll free, 24 hour hotline) call the [facility]: 9202529684 call 911   Patient verbalizes understanding of instructions and care plan provided today and agrees to view in [place]. Active MyChart status and patient understanding of how to access instructions and care plan via MyChart confirmed with patient.     Pending further services from segmented RN CM, [place] [name], RN, BSN, [place] Coordinator Office number [phone]

## 2021-09-04 NOTE — Progress Notes (Signed)
  Subjective:  Patient ID: Allen Yu, male    DOB: 1945/04/30,  MRN: [id number]  Chief Complaint  Patient presents with   Routine Post Op    POV #2 DOS 08/17/2021 TRANSMETATARSAL AMPUTATION RT FOOT, DEBRIDEMENT OF LEFT LEG ULCER     76 y.o. male returns for post-op check.  Doing okay   Review of Systems: Negative except as noted in the HPI. Denies N/V/F/Ch.   Objective:  There were no vitals filed for this visit. There is no height or weight on file to calculate BMI. Constitutional Well developed. Well nourished.  Vascular Foot warm and well perfused. Capillary refill normal to all digits.  Calf is soft and supple, no posterior calf or knee pain, negative Homans' sign  Neurologic Normal speech. Oriented to person, place, and time. Epicritic sensation to light touch grossly present bilaterally.  Dermatologic Graft appears to be desiccated, does not seem to be integrating well.  Amputation site of midfoot has some evidence of early necrosis  Orthopedic: Little to no tenderness to palpation noted about the surgical site.    Assessment:   1. Gangrene of right foot ([place])   2. PAD (peripheral artery disease) ([place])    Plan:  Patient was evaluated and treated and all questions answered.  S/p foot surgery right -At this point seems to be diminishing in its healing ability.  The TMA site is showing signs of necrosis.  I did place him on doxycycline.  I will see him back in 1 week for reevaluation.  Ultimately this may not be successful and he may require limb amputation.  Return in about 1 week (around 09/07/2021) for wound care.

## 2021-09-05 DIAGNOSIS — I700 Atherosclerosis of aorta: Secondary | ICD-10-CM | POA: Diagnosis not present

## 2021-09-05 DIAGNOSIS — E785 Hyperlipidemia, unspecified: Secondary | ICD-10-CM | POA: Diagnosis not present

## 2021-09-05 DIAGNOSIS — D638 Anemia in other chronic diseases classified elsewhere: Secondary | ICD-10-CM | POA: Diagnosis not present

## 2021-09-05 DIAGNOSIS — E1165 Type 2 diabetes mellitus with hyperglycemia: Secondary | ICD-10-CM | POA: Diagnosis not present

## 2021-09-05 DIAGNOSIS — I10 Essential (primary) hypertension: Secondary | ICD-10-CM | POA: Diagnosis not present

## 2021-09-05 DIAGNOSIS — R809 Proteinuria, unspecified: Secondary | ICD-10-CM | POA: Diagnosis not present

## 2021-09-06 ENCOUNTER — Emergency Department (HOSPITAL_COMMUNITY): Payer: Medicare Other

## 2021-09-06 ENCOUNTER — Inpatient Hospital Stay (HOSPITAL_COMMUNITY)
Admission: EM | Admit: 2021-09-06 | Discharge: 2021-09-15 | DRG: 871 | Disposition: A | Discharge status: Hospice | Payer: Medicare Other | Attending: Internal Medicine | Admitting: Internal Medicine

## 2021-09-06 ENCOUNTER — Other Ambulatory Visit: Payer: Self-pay

## 2021-09-06 ENCOUNTER — Inpatient Hospital Stay (HOSPITAL_COMMUNITY): Payer: Medicare Other

## 2021-09-06 ENCOUNTER — Encounter (HOSPITAL_COMMUNITY): Payer: Self-pay

## 2021-09-06 DIAGNOSIS — K922 Gastrointestinal hemorrhage, unspecified: Secondary | ICD-10-CM

## 2021-09-06 DIAGNOSIS — Z7982 Long term (current) use of aspirin: Secondary | ICD-10-CM

## 2021-09-06 DIAGNOSIS — F209 Schizophrenia, unspecified: Secondary | ICD-10-CM | POA: Diagnosis present

## 2021-09-06 DIAGNOSIS — I130 Hypertensive heart and chronic kidney disease with heart failure and stage 1 through stage 4 chronic kidney disease, or unspecified chronic kidney disease: Secondary | ICD-10-CM | POA: Diagnosis not present

## 2021-09-06 DIAGNOSIS — D509 Iron deficiency anemia, unspecified: Secondary | ICD-10-CM | POA: Diagnosis not present

## 2021-09-06 DIAGNOSIS — Z9841 Cataract extraction status, right eye: Secondary | ICD-10-CM

## 2021-09-06 DIAGNOSIS — I5042 Chronic combined systolic (congestive) and diastolic (congestive) heart failure: Secondary | ICD-10-CM | POA: Diagnosis present

## 2021-09-06 DIAGNOSIS — Z781 Physical restraint status: Secondary | ICD-10-CM

## 2021-09-06 DIAGNOSIS — Z961 Presence of intraocular lens: Secondary | ICD-10-CM | POA: Diagnosis present

## 2021-09-06 DIAGNOSIS — F0390 Unspecified dementia without behavioral disturbance: Secondary | ICD-10-CM | POA: Diagnosis present

## 2021-09-06 DIAGNOSIS — I739 Peripheral vascular disease, unspecified: Secondary | ICD-10-CM | POA: Diagnosis not present

## 2021-09-06 DIAGNOSIS — E871 Hypo-osmolality and hyponatremia: Secondary | ICD-10-CM | POA: Diagnosis present

## 2021-09-06 DIAGNOSIS — E11649 Type 2 diabetes mellitus with hypoglycemia without coma: Secondary | ICD-10-CM | POA: Diagnosis not present

## 2021-09-06 DIAGNOSIS — R6521 Severe sepsis with septic shock: Secondary | ICD-10-CM | POA: Diagnosis present

## 2021-09-06 DIAGNOSIS — I255 Ischemic cardiomyopathy: Secondary | ICD-10-CM | POA: Diagnosis present

## 2021-09-06 DIAGNOSIS — I420 Dilated cardiomyopathy: Secondary | ICD-10-CM | POA: Diagnosis present

## 2021-09-06 DIAGNOSIS — L89151 Pressure ulcer of sacral region, stage 1: Secondary | ICD-10-CM | POA: Diagnosis present

## 2021-09-06 DIAGNOSIS — E860 Dehydration: Secondary | ICD-10-CM | POA: Diagnosis present

## 2021-09-06 DIAGNOSIS — T83511A Infection and inflammatory reaction due to indwelling urethral catheter, initial encounter: Secondary | ICD-10-CM | POA: Diagnosis not present

## 2021-09-06 DIAGNOSIS — E1152 Type 2 diabetes mellitus with diabetic peripheral angiopathy with gangrene: Secondary | ICD-10-CM | POA: Diagnosis present

## 2021-09-06 DIAGNOSIS — Z720 Tobacco use: Secondary | ICD-10-CM | POA: Diagnosis not present

## 2021-09-06 DIAGNOSIS — I471 Supraventricular tachycardia: Secondary | ICD-10-CM | POA: Diagnosis not present

## 2021-09-06 DIAGNOSIS — I4819 Other persistent atrial fibrillation: Secondary | ICD-10-CM | POA: Diagnosis present

## 2021-09-06 DIAGNOSIS — D62 Acute posthemorrhagic anemia: Secondary | ICD-10-CM | POA: Diagnosis present

## 2021-09-06 DIAGNOSIS — I484 Atypical atrial flutter: Secondary | ICD-10-CM

## 2021-09-06 DIAGNOSIS — Z79899 Other long term (current) drug therapy: Secondary | ICD-10-CM

## 2021-09-06 DIAGNOSIS — N179 Acute kidney failure, unspecified: Secondary | ICD-10-CM | POA: Diagnosis not present

## 2021-09-06 DIAGNOSIS — D649 Anemia, unspecified: Principal | ICD-10-CM

## 2021-09-06 DIAGNOSIS — B965 Pseudomonas (aeruginosa) (mallei) (pseudomallei) as the cause of diseases classified elsewhere: Secondary | ICD-10-CM | POA: Diagnosis present

## 2021-09-06 DIAGNOSIS — N1832 Chronic kidney disease, stage 3b: Secondary | ICD-10-CM | POA: Diagnosis present

## 2021-09-06 DIAGNOSIS — Z95820 Peripheral vascular angioplasty status with implants and grafts: Secondary | ICD-10-CM

## 2021-09-06 DIAGNOSIS — Z66 Do not resuscitate: Secondary | ICD-10-CM | POA: Diagnosis present

## 2021-09-06 DIAGNOSIS — K921 Melena: Secondary | ICD-10-CM | POA: Diagnosis not present

## 2021-09-06 DIAGNOSIS — N281 Cyst of kidney, acquired: Secondary | ICD-10-CM | POA: Diagnosis not present

## 2021-09-06 DIAGNOSIS — Z91030 Bee allergy status: Secondary | ICD-10-CM

## 2021-09-06 DIAGNOSIS — Z8249 Family history of ischemic heart disease and other diseases of the circulatory system: Secondary | ICD-10-CM

## 2021-09-06 DIAGNOSIS — Z515 Encounter for palliative care: Secondary | ICD-10-CM

## 2021-09-06 DIAGNOSIS — I10 Essential (primary) hypertension: Secondary | ICD-10-CM | POA: Diagnosis not present

## 2021-09-06 DIAGNOSIS — N170 Acute kidney failure with tubular necrosis: Secondary | ICD-10-CM | POA: Diagnosis present

## 2021-09-06 DIAGNOSIS — Z87440 Personal history of urinary (tract) infections: Secondary | ICD-10-CM

## 2021-09-06 DIAGNOSIS — K5730 Diverticulosis of large intestine without perforation or abscess without bleeding: Secondary | ICD-10-CM | POA: Diagnosis present

## 2021-09-06 DIAGNOSIS — R531 Weakness: Secondary | ICD-10-CM | POA: Diagnosis not present

## 2021-09-06 DIAGNOSIS — R627 Adult failure to thrive: Secondary | ICD-10-CM | POA: Diagnosis present

## 2021-09-06 DIAGNOSIS — M7989 Other specified soft tissue disorders: Secondary | ICD-10-CM | POA: Diagnosis not present

## 2021-09-06 DIAGNOSIS — R64 Cachexia: Secondary | ICD-10-CM | POA: Diagnosis present

## 2021-09-06 DIAGNOSIS — A419 Sepsis, unspecified organism: Secondary | ICD-10-CM | POA: Diagnosis not present

## 2021-09-06 DIAGNOSIS — N399 Disorder of urinary system, unspecified: Secondary | ICD-10-CM | POA: Diagnosis not present

## 2021-09-06 DIAGNOSIS — Z885 Allergy status to narcotic agent status: Secondary | ICD-10-CM

## 2021-09-06 DIAGNOSIS — Z888 Allergy status to other drugs, medicaments and biological substances status: Secondary | ICD-10-CM

## 2021-09-06 DIAGNOSIS — E1122 Type 2 diabetes mellitus with diabetic chronic kidney disease: Secondary | ICD-10-CM | POA: Diagnosis present

## 2021-09-06 DIAGNOSIS — Z7902 Long term (current) use of antithrombotics/antiplatelets: Secondary | ICD-10-CM

## 2021-09-06 DIAGNOSIS — Z794 Long term (current) use of insulin: Secondary | ICD-10-CM

## 2021-09-06 DIAGNOSIS — E785 Hyperlipidemia, unspecified: Secondary | ICD-10-CM | POA: Diagnosis present

## 2021-09-06 DIAGNOSIS — J449 Chronic obstructive pulmonary disease, unspecified: Secondary | ICD-10-CM | POA: Diagnosis present

## 2021-09-06 DIAGNOSIS — D631 Anemia in chronic kidney disease: Secondary | ICD-10-CM | POA: Diagnosis present

## 2021-09-06 DIAGNOSIS — E46 Unspecified protein-calorie malnutrition: Secondary | ICD-10-CM | POA: Diagnosis not present

## 2021-09-06 DIAGNOSIS — I96 Gangrene, not elsewhere classified: Secondary | ICD-10-CM

## 2021-09-06 DIAGNOSIS — Z9842 Cataract extraction status, left eye: Secondary | ICD-10-CM

## 2021-09-06 DIAGNOSIS — Z681 Body mass index (BMI) 19 or less, adult: Secondary | ICD-10-CM

## 2021-09-06 DIAGNOSIS — Z89431 Acquired absence of right foot: Secondary | ICD-10-CM | POA: Diagnosis not present

## 2021-09-06 DIAGNOSIS — F03918 Unspecified dementia, unspecified severity, with other behavioral disturbance: Secondary | ICD-10-CM | POA: Diagnosis not present

## 2021-09-06 DIAGNOSIS — L8990 Pressure ulcer of unspecified site, unspecified stage: Secondary | ICD-10-CM | POA: Insufficient documentation

## 2021-09-06 DIAGNOSIS — E1142 Type 2 diabetes mellitus with diabetic polyneuropathy: Secondary | ICD-10-CM | POA: Diagnosis present

## 2021-09-06 DIAGNOSIS — I480 Paroxysmal atrial fibrillation: Secondary | ICD-10-CM | POA: Diagnosis not present

## 2021-09-06 DIAGNOSIS — Y846 Urinary catheterization as the cause of abnormal reaction of the patient, or of later complication, without mention of misadventure at the time of the procedure: Secondary | ICD-10-CM | POA: Diagnosis present

## 2021-09-06 DIAGNOSIS — R339 Retention of urine, unspecified: Secondary | ICD-10-CM

## 2021-09-06 DIAGNOSIS — E119 Type 2 diabetes mellitus without complications: Secondary | ICD-10-CM | POA: Diagnosis not present

## 2021-09-06 DIAGNOSIS — R6520 Severe sepsis without septic shock: Secondary | ICD-10-CM

## 2021-09-06 DIAGNOSIS — F03911 Unspecified dementia, unspecified severity, with agitation: Secondary | ICD-10-CM | POA: Diagnosis not present

## 2021-09-06 DIAGNOSIS — F17210 Nicotine dependence, cigarettes, uncomplicated: Secondary | ICD-10-CM | POA: Diagnosis present

## 2021-09-06 DIAGNOSIS — Z7189 Other specified counseling: Secondary | ICD-10-CM | POA: Diagnosis not present

## 2021-09-06 DIAGNOSIS — N390 Urinary tract infection, site not specified: Secondary | ICD-10-CM | POA: Diagnosis present

## 2021-09-06 DIAGNOSIS — K219 Gastro-esophageal reflux disease without esophagitis: Secondary | ICD-10-CM | POA: Diagnosis not present

## 2021-09-06 DIAGNOSIS — E1165 Type 2 diabetes mellitus with hyperglycemia: Secondary | ICD-10-CM | POA: Diagnosis present

## 2021-09-06 DIAGNOSIS — H9190 Unspecified hearing loss, unspecified ear: Secondary | ICD-10-CM | POA: Diagnosis present

## 2021-09-06 DIAGNOSIS — F4310 Post-traumatic stress disorder, unspecified: Secondary | ICD-10-CM | POA: Diagnosis present

## 2021-09-06 DIAGNOSIS — Z87442 Personal history of urinary calculi: Secondary | ICD-10-CM

## 2021-09-06 DIAGNOSIS — R000 Tachycardia, unspecified: Secondary | ICD-10-CM | POA: Diagnosis not present

## 2021-09-06 DIAGNOSIS — M1832 Unilateral post-traumatic osteoarthritis of first carpometacarpal joint, left hand: Secondary | ICD-10-CM | POA: Diagnosis present

## 2021-09-06 DIAGNOSIS — G9341 Metabolic encephalopathy: Secondary | ICD-10-CM | POA: Diagnosis present

## 2021-09-06 DIAGNOSIS — Z9049 Acquired absence of other specified parts of digestive tract: Secondary | ICD-10-CM

## 2021-09-06 DIAGNOSIS — R933 Abnormal findings on diagnostic imaging of other parts of digestive tract: Secondary | ICD-10-CM | POA: Diagnosis not present

## 2021-09-06 DIAGNOSIS — E861 Hypovolemia: Secondary | ICD-10-CM | POA: Diagnosis present

## 2021-09-06 DIAGNOSIS — E43 Unspecified severe protein-calorie malnutrition: Secondary | ICD-10-CM | POA: Diagnosis not present

## 2021-09-06 DIAGNOSIS — Z89612 Acquired absence of left leg above knee: Secondary | ICD-10-CM

## 2021-09-06 LAB — MAGNESIUM: Magnesium: 1.1 mg/dL — ABNORMAL LOW (ref 1.7–2.4)

## 2021-09-06 LAB — PHOSPHORUS: Phosphorus: 7.7 mg/dL — ABNORMAL HIGH (ref 2.5–4.6)

## 2021-09-06 LAB — PREPARE RBC (CROSSMATCH)

## 2021-09-06 LAB — CBC WITH DIFFERENTIAL/PLATELET
Abs Immature Granulocytes: 0.1 10*3/uL — ABNORMAL HIGH (ref 0.00–0.07)
Abs Immature Granulocytes: 0.13 10*3/uL — ABNORMAL HIGH (ref 0.00–0.07)
Basophils Absolute: 0 10*3/uL (ref 0.0–0.1)
Basophils Absolute: 0 10*3/uL (ref 0.0–0.1)
Basophils Relative: 0 %
Basophils Relative: 0 %
Eosinophils Absolute: 0 10*3/uL (ref 0.0–0.5)
Eosinophils Absolute: 0 10*3/uL (ref 0.0–0.5)
Eosinophils Relative: 0 %
Eosinophils Relative: 0 %
HCT: 16.7 % — ABNORMAL LOW (ref 39.0–52.0)
HCT: 21.7 % — ABNORMAL LOW (ref 39.0–52.0)
Hemoglobin: 5.6 g/dL — CL (ref 13.0–17.0)
Hemoglobin: 7.3 g/dL — ABNORMAL LOW (ref 13.0–17.0)
Immature Granulocytes: 1 %
Immature Granulocytes: 1 %
Lymphocytes Relative: 5 %
Lymphocytes Relative: 5 %
Lymphs Abs: 0.9 10*3/uL (ref 0.7–4.0)
Lymphs Abs: 0.8 10*3/uL (ref 0.7–4.0)
MCH: 31.5 pg (ref 26.0–34.0)
MCH: 31.6 pg (ref 26.0–34.0)
MCHC: 33.5 g/dL (ref 30.0–36.0)
MCHC: 33.6 g/dL (ref 30.0–36.0)
MCV: 93.8 fL (ref 80.0–100.0)
MCV: 93.9 fL (ref 80.0–100.0)
Monocytes Absolute: 0.8 10*3/uL (ref 0.1–1.0)
Monocytes Absolute: 1 10*3/uL (ref 0.1–1.0)
Monocytes Relative: 5 %
Monocytes Relative: 6 %
Neutro Abs: 15.5 10*3/uL — ABNORMAL HIGH (ref 1.7–7.7)
Neutro Abs: 14.5 10*3/uL — ABNORMAL HIGH (ref 1.7–7.7)
Neutrophils Relative %: 89 %
Neutrophils Relative %: 88 %
Platelets: 594 10*3/uL — ABNORMAL HIGH (ref 150–400)
Platelets: 447 10*3/uL — ABNORMAL HIGH (ref 150–400)
RBC: 1.78 MIL/uL — ABNORMAL LOW (ref 4.22–5.81)
RBC: 2.31 MIL/uL — ABNORMAL LOW (ref 4.22–5.81)
RDW: 14.6 % (ref 11.5–15.5)
RDW: 13.9 % (ref 11.5–15.5)
WBC: 17.3 10*3/uL — ABNORMAL HIGH (ref 4.0–10.5)
WBC: 16.5 10*3/uL — ABNORMAL HIGH (ref 4.0–10.5)
nRBC: 0 % (ref 0.0–0.2)
nRBC: 0 % (ref 0.0–0.2)

## 2021-09-06 LAB — BLOOD GAS, VENOUS
Acid-base deficit: 3.5 mmol/L — ABNORMAL HIGH (ref 0.0–2.0)
Bicarbonate: 22.1 mmol/L (ref 20.0–28.0)
Drawn by: 57332
FIO2: 21 %
O2 Saturation: 18.6 %
Patient temperature: 36.6
pCO2, Ven: 40 mmHg — ABNORMAL LOW (ref 44–60)
pH, Ven: 7.35 (ref 7.25–7.43)
pO2, Ven: <31 mmHg — CL (ref 32–45)

## 2021-09-06 LAB — COMPREHENSIVE METABOLIC PANEL
ALT: 13 U/L (ref 0–44)
AST: 23 U/L (ref 15–41)
Albumin: 3.1 g/dL — ABNORMAL LOW (ref 3.5–5.0)
Alkaline Phosphatase: 104 U/L (ref 38–126)
Anion gap: 18 — ABNORMAL HIGH (ref 5–15)
BUN: 134 mg/dL — ABNORMAL HIGH (ref 8–23)
CO2: 20 mmol/L — ABNORMAL LOW (ref 22–32)
Calcium: 9 mg/dL (ref 8.9–10.3)
Chloride: 96 mmol/L — ABNORMAL LOW (ref 98–111)
Creatinine, Ser: 7.74 mg/dL — ABNORMAL HIGH (ref 0.61–1.24)
GFR, Estimated: 7 mL/min — ABNORMAL LOW (ref >60)
Glucose, Bld: 175 mg/dL — ABNORMAL HIGH (ref 70–99)
Potassium: 4.9 mmol/L (ref 3.5–5.1)
Sodium: 134 mmol/L — ABNORMAL LOW (ref 135–145)
Total Bilirubin: 0.7 mg/dL (ref 0.3–1.2)
Total Protein: 8.1 g/dL (ref 6.5–8.1)

## 2021-09-06 LAB — URINALYSIS, ROUTINE W REFLEX MICROSCOPIC
Bilirubin Urine: NEGATIVE
Glucose, UA: NEGATIVE mg/dL
Ketones, ur: 5 mg/dL — AB
Nitrite: NEGATIVE
Protein, ur: 100 mg/dL — AB
Specific Gravity, Urine: 1.011 (ref 1.005–1.030)
pH: 5 (ref 5.0–8.0)

## 2021-09-06 LAB — APTT: aPTT: 21 seconds — ABNORMAL LOW (ref 24–36)

## 2021-09-06 LAB — BASIC METABOLIC PANEL
Anion gap: 16 — ABNORMAL HIGH (ref 5–15)
BUN: 148 mg/dL — ABNORMAL HIGH (ref 8–23)
CO2: 19 mmol/L — ABNORMAL LOW (ref 22–32)
Calcium: 8.3 mg/dL — ABNORMAL LOW (ref 8.9–10.3)
Chloride: 97 mmol/L — ABNORMAL LOW (ref 98–111)
Creatinine, Ser: 7.23 mg/dL — ABNORMAL HIGH (ref 0.61–1.24)
GFR, Estimated: 7 mL/min — ABNORMAL LOW (ref >60)
Glucose, Bld: 230 mg/dL — ABNORMAL HIGH (ref 70–99)
Potassium: 4.4 mmol/L (ref 3.5–5.1)
Sodium: 132 mmol/L — ABNORMAL LOW (ref 135–145)

## 2021-09-06 LAB — CBG MONITORING, ED
Glucose-Capillary: 226 mg/dL — ABNORMAL HIGH (ref 70–99)
Glucose-Capillary: 159 mg/dL — ABNORMAL HIGH (ref 70–99)
Glucose-Capillary: 139 mg/dL — ABNORMAL HIGH (ref 70–99)

## 2021-09-06 LAB — PROTIME-INR
INR: 1.3 — ABNORMAL HIGH (ref 0.8–1.2)
Prothrombin Time: 16.5 seconds — ABNORMAL HIGH (ref 11.4–15.2)

## 2021-09-06 LAB — PROCALCITONIN: Procalcitonin: 0.41 ng/mL

## 2021-09-06 LAB — BRAIN NATRIURETIC PEPTIDE: B Natriuretic Peptide: 176.9 pg/mL — ABNORMAL HIGH (ref 0.0–100.0)

## 2021-09-06 LAB — SEDIMENTATION RATE: Sed Rate: >140 mm/hr — ABNORMAL HIGH (ref 0–16)

## 2021-09-06 LAB — LACTIC ACID, PLASMA: Lactic Acid, Venous: 1.2 mmol/L (ref 0.5–1.9)

## 2021-09-06 LAB — C-REACTIVE PROTEIN: CRP: 25.5 mg/dL — ABNORMAL HIGH (ref <1.0)

## 2021-09-06 MED ORDER — ACETAMINOPHEN 325 MG PO TABS: 650.0000 mg | ORAL_TABLET | Freq: Three times a day (TID) | ORAL | Status: DC | PRN

## 2021-09-06 MED ORDER — RISPERIDONE 0.5 MG PO TABS
0.5000 mg | ORAL_TABLET | Freq: Two times a day (BID) | ORAL | Status: DC | PRN
  Administered 2021-09-08 (×2): 0.5 mg via ORAL
  Filled 2021-09-06 (×3): qty 1

## 2021-09-06 MED ORDER — DONEPEZIL HCL 5 MG PO TABS
10.0000 mg | ORAL_TABLET | Freq: Every day | ORAL | Status: DC
  Administered 2021-09-07 – 2021-09-14 (×8): 10 mg via ORAL
  Filled 2021-09-06 (×9): qty 2

## 2021-09-06 MED ORDER — LACTATED RINGERS IV BOLUS
1000.0000 mL | Freq: Once | INTRAVENOUS | Status: AC
  Administered 2021-09-06: 1000 mL via INTRAVENOUS

## 2021-09-06 MED ORDER — MAGNESIUM SULFATE 2 GM/50ML IV SOLN
2.0000 g | INTRAVENOUS | Status: AC
  Administered 2021-09-06 (×2): 2 g via INTRAVENOUS
  Filled 2021-09-06 (×2): qty 50

## 2021-09-06 MED ORDER — NOREPINEPHRINE 4 MG/250ML-% IV SOLN
0.0000 ug/min | INTRAVENOUS | Status: DC
  Filled 2021-09-06: qty 250

## 2021-09-06 MED ORDER — SODIUM CHLORIDE 0.9% IV SOLUTION: Freq: Once | INTRAVENOUS | Status: AC

## 2021-09-06 MED ORDER — SODIUM CHLORIDE 0.9 % IV SOLN
2.0000 g | Freq: Once | INTRAVENOUS | Status: AC
  Administered 2021-09-06: 2 g via INTRAVENOUS
  Filled 2021-09-06: qty 12.5

## 2021-09-06 MED ORDER — PANTOPRAZOLE SODIUM 40 MG IV SOLR
40.0000 mg | Freq: Two times a day (BID) | INTRAVENOUS | Status: DC
  Administered 2021-09-06 – 2021-09-14 (×17): 40 mg via INTRAVENOUS
  Filled 2021-09-06 (×17): qty 10

## 2021-09-06 MED ORDER — SODIUM CHLORIDE 0.9 % IV SOLN: 10.0000 mL/h | Freq: Once | INTRAVENOUS | Status: DC

## 2021-09-06 MED ORDER — VANCOMYCIN VARIABLE DOSE PER UNSTABLE RENAL FUNCTION (PHARMACIST DOSING): Status: DC

## 2021-09-06 MED ORDER — HALOPERIDOL LACTATE 5 MG/ML IJ SOLN
2.0000 mg | Freq: Once | INTRAMUSCULAR | Status: AC
  Administered 2021-09-06: 2 mg via INTRAVENOUS
  Filled 2021-09-06: qty 1

## 2021-09-06 MED ORDER — ATORVASTATIN CALCIUM 40 MG PO TABS
40.0000 mg | ORAL_TABLET | Freq: Every day | ORAL | Status: DC
  Administered 2021-09-07 – 2021-09-14 (×8): 40 mg via ORAL
  Filled 2021-09-06 (×9): qty 1

## 2021-09-06 MED ORDER — INSULIN ASPART 100 UNIT/ML IJ SOLN
0.0000 [IU] | INTRAMUSCULAR | Status: DC
  Administered 2021-09-06: 3 [IU] via SUBCUTANEOUS
  Administered 2021-09-06 – 2021-09-07 (×2): 1 [IU] via SUBCUTANEOUS
  Administered 2021-09-07: 2 [IU] via SUBCUTANEOUS
  Administered 2021-09-07 (×2): 1 [IU] via SUBCUTANEOUS
  Administered 2021-09-07 – 2021-09-08 (×2): 2 [IU] via SUBCUTANEOUS
  Administered 2021-09-08 – 2021-09-09 (×2): 1 [IU] via SUBCUTANEOUS
  Administered 2021-09-10: 2 [IU] via SUBCUTANEOUS
  Administered 2021-09-10: 1 [IU] via SUBCUTANEOUS
  Administered 2021-09-10: 3 [IU] via SUBCUTANEOUS
  Administered 2021-09-10: 2 [IU] via SUBCUTANEOUS
  Administered 2021-09-11 (×3): 3 [IU] via SUBCUTANEOUS
  Administered 2021-09-11: 1 [IU] via SUBCUTANEOUS
  Administered 2021-09-12: 3 [IU] via SUBCUTANEOUS
  Administered 2021-09-12: 7 [IU] via SUBCUTANEOUS
  Administered 2021-09-13: 2 [IU] via SUBCUTANEOUS
  Administered 2021-09-14: 1 [IU] via SUBCUTANEOUS

## 2021-09-06 MED ORDER — MIRTAZAPINE 15 MG PO TABS
7.5000 mg | ORAL_TABLET | Freq: Every day | ORAL | Status: DC
  Administered 2021-09-07 – 2021-09-14 (×8): 7.5 mg via ORAL
  Filled 2021-09-06 (×9): qty 1

## 2021-09-06 MED ORDER — DOCUSATE SODIUM 100 MG PO CAPS: 100.0000 mg | ORAL_CAPSULE | Freq: Two times a day (BID) | ORAL | Status: DC | PRN

## 2021-09-06 MED ORDER — INSULIN GLARGINE-YFGN 100 UNIT/ML ~~LOC~~ SOLN
8.0000 [IU] | Freq: Every day | SUBCUTANEOUS | Status: DC
  Administered 2021-09-06 – 2021-09-08 (×3): 8 [IU] via SUBCUTANEOUS
  Filled 2021-09-06 (×5): qty 0.08

## 2021-09-06 MED ORDER — POLYETHYLENE GLYCOL 3350 17 G PO PACK: 17.0000 g | PACK | Freq: Every day | ORAL | Status: DC | PRN

## 2021-09-06 MED ORDER — FERROUS SULFATE 325 (65 FE) MG PO TABS
325.0000 mg | ORAL_TABLET | Freq: Every day | ORAL | Status: DC
  Administered 2021-09-07 – 2021-09-15 (×4): 325 mg via ORAL
  Filled 2021-09-06 (×6): qty 1

## 2021-09-06 MED ORDER — VANCOMYCIN HCL IN DEXTROSE 1-5 GM/200ML-% IV SOLN
1000.0000 mg | Freq: Once | INTRAVENOUS | Status: AC
  Administered 2021-09-06: 1000 mg via INTRAVENOUS
  Filled 2021-09-06: qty 200

## 2021-09-06 MED ORDER — PHENYLEPHRINE 80 MCG/ML (10ML) SYRINGE FOR IV PUSH (FOR BLOOD PRESSURE SUPPORT)
80.0000 ug | PREFILLED_SYRINGE | Freq: Once | INTRAVENOUS | Status: AC
  Administered 2021-09-06: 120 ug via INTRAVENOUS

## 2021-09-06 MED ORDER — PHENYLEPHRINE 80 MCG/ML (10ML) SYRINGE FOR IV PUSH (FOR BLOOD PRESSURE SUPPORT)
PREFILLED_SYRINGE | INTRAVENOUS | Status: AC
  Filled 2021-09-06: qty 10

## 2021-09-06 MED ORDER — CLINDAMYCIN HCL 150 MG PO CAPS
300.0000 mg | ORAL_CAPSULE | Freq: Once | ORAL | Status: AC
  Administered 2021-09-06: 300 mg via ORAL
  Filled 2021-09-06: qty 2

## 2021-09-06 NOTE — ED Notes (Addendum)
Pt highly aggressive refusing all interventions and making attacking movements toward staff admitting paged. [facility] hospitialist stated they are not picking this pt up until tomorrow, PCCM paged

## 2021-09-06 NOTE — ED Provider Notes (Signed)
Patient arrives from [facility] ED for evaluation by vascular surgery.  Dr. Stanford Breed with vascular surgery made aware of patient's arrival.  Critical care is aware of case and will consult for admission.   Patient appears comfortable on arrival.     Valarie Merino, MD 09/06/21 1308

## 2021-09-06 NOTE — ED Notes (Signed)
Date and time results received: 09/06/21 1001   (use smartphrase ".now" to insert current time)  Test: CBC  Critical Value: Hgb - 5.6  Name of Provider Notified: Dr. Matilde Sprang  Orders Received? Or Actions Taken?: Orders Received - See Orders for details

## 2021-09-06 NOTE — ED Notes (Signed)
EDP notified of post transfusion temp, direct to continue with blood transfusions

## 2021-09-06 NOTE — ED Notes (Signed)
Cefepime changed to #18 R UA IV for blood administration to be done in #20 IV

## 2021-09-06 NOTE — Consult Note (Signed)
[facility] [place] NOTE   MRN : [id number]  Reason for Consult: Ischemic  right TMA Referring Physician: ER  History of Present Illness: 76 y/o male with history of PAD.  He has been followed by Dr. Donnetta Hutching in the past and ultimately underwent left AKA after failed left femoral to above-knee popliteal bypass with Gore-Tex on 05/27/2020.  More recently he developed right ischemic leg and toe wounds.  DR. Virl Cagey performed interventional angiogram with balloon angioplasty of the peroneal and popliteal arteries followed by SFA popliteal stenting from the mid SFA into the P2 segment of the popliteal artery.  Popliteal stent 5 x 80 mm Bio-mimick, SFA stents 6 x 150 mm x 2.  The patient then under went TMA by Dr. Adair Laundry.    He presented to the ED with dry gangrene changes to the right TMA.  Per his wife, who provides most of the history, patient has been ill for many days.  He has not been taking much by mouth.  Concerned, she called her primary care physician, who drew laboratory studies and advised the patient go to the ER.  At [facility] ER, he was noted to have ischemic changes of his right transmetatarsal amputation.  He was transferred to the [facility], ER for definitive management.  Current Facility-Administered Medications  Medication Dose Route Frequency Provider Last Rate Last Admin   0.9 %  sodium chloride infusion (Manually program via Guardrails IV Fluids)   Intravenous Once Kommor, Madison, MD       0.9 %  sodium chloride infusion  10 mL/hr Intravenous Once Valarie Merino, MD       phenylephrine 80 mcg/10 mL injection            vancomycin variable dose per unstable renal function (pharmacist dosing)   Does not apply See admin instructions Kommor, Madison, MD       Current Outpatient Medications  Medication Sig Dispense Refill   acetaminophen (TYLENOL) 500 MG tablet Take 500 mg by mouth 2 (two) times daily as needed for moderate pain.     aspirin EC 81  MG tablet Take 1 tablet (81 mg total) by mouth daily with breakfast. 90 tablet 2   atorvastatin (LIPITOR) 40 MG tablet Take 1 tablet (40 mg total) by mouth at bedtime. 90 tablet 0   clopidogrel (PLAVIX) 75 MG tablet Take 1 tablet (75 mg total) by mouth daily. 90 tablet 3   donepezil (ARICEPT) 10 MG tablet Take 10 mg by mouth at bedtime.     doxycycline (VIBRA-TABS) 100 MG tablet Take 1 tablet (100 mg total) by mouth 2 (two) times daily. 28 tablet 0   ferrous sulfate 325 (65 FE) MG tablet Take 325 mg by mouth daily with breakfast.     gabapentin (NEURONTIN) 100 MG capsule Take 1 capsule (100 mg total) by mouth 2 (two) times daily. 60 capsule 3   LANTUS 100 UNIT/ML injection Inject 0.08 mLs (8 Units total) into the skin at bedtime. 10 mL 11   losartan (COZAAR) 50 MG tablet Take 1 tablet (50 mg total) by mouth daily. 90 tablet 3   metoprolol succinate (TOPROL-XL) 50 MG 24 hr tablet Take 1 tablet (50 mg total) by mouth daily. Take with or immediately following a meal. 90 tablet 3   mirtazapine (REMERON) 15 MG tablet TAKE ONE-HALF TABLET BY MOUTH AT BEDTIME FOR APPETITE     Multiple Vitamins-Minerals (MULTIVITAMIN WITH MINERALS) tablet Take 1 tablet by mouth daily.  120 tablet 2   omeprazole (PRILOSEC) 20 MG capsule Take 1 capsule by mouth daily.     ondansetron (ZOFRAN) 4 MG tablet Take 1 tablet (4 mg total) by mouth every 6 (six) hours as needed for nausea. 20 tablet 0   oxyCODONE-acetaminophen (PERCOCET) 5-325 MG tablet Take 1 tablet by mouth every 4 (four) hours as needed for severe pain. 20 tablet 0   risperiDONE (RISPERDAL) 0.5 MG tablet Take 1 tablet (0.5 mg total) by mouth every 12 (twelve) hours as needed (Restlessness and agitation). 60 tablet 1   senna-docusate (SENEXON-S) 8.6-50 MG tablet TAKE 2 TABLETS BY MOUTH DAILY AS NEEDED FOR CONSTIPATION     torsemide 40 MG TABS Take 40 mg by mouth every morning. 90 tablet 3   traMADol (ULTRAM) 50 MG tablet Take 50 mg by mouth every 6 (six) hours as  needed for moderate pain.     Alcohol Swabs (ALCOHOL PADS) 70 % PADS USE 1 PAD AS DIRECTED     B-D UF III MINI PEN NEEDLES 31G X 5 MM MISC SMARTSIG:1 Each SUB-Q Daily     feeding supplement, ENSURE COMPLETE, (ENSURE COMPLETE) LIQD Take 237 mLs by mouth 2 (two) times daily between meals. 23700 mL 1    Pt meds include: Statin :Yes Betablocker: Yes ASA: Yes Other anticoagulants/antiplatelets: none  Past Medical History:  Diagnosis Date   Anemia    Arthritis    Carotid stenosis    Chronic back pain    Chronic HFrEF (heart failure with reduced ejection fraction) ([place])    a. 09/2020 Echo: EF 45%, mild LVH; b. 12/2020 Echo: EF 25-30%, glob HK, mod LVH, mildly reduced RV fxn, RVSP 65.61mHg, mod BAE. Triv effusion. Mod MR/TR.   CKD (chronic kidney disease), stage III ([place]    Constipation    Dementia ([place]    Diabetes mellitus    Dilated cardiomyopathy ([place]    a. 09/2020 Echo: EF 45%; b. 12/2020 Echo: EF 25-30%.   Foot drop, right    GERD (gastroesophageal reflux disease)    History of kidney stones    Hypertension    Lung nodule    PAF (paroxysmal atrial fibrillation) (HCC)    Peripheral neuropathy    Peripheral vascular disease ([place]    a. 06/2020 s/p L AKA; b. 10/2020 s/p R SFA and above-knee popliteal PTA.   PTSD (post-traumatic stress disorder)    Schizophrenia ([place]    Tuberculosis    Treated    Past Surgical History:  Procedure Laterality Date   ABDOMINAL AORTOGRAM W/LOWER EXTREMITY N/A 05/20/2020   Procedure: ABDOMINAL AORTOGRAM W/LOWER EXTREMITY;  Surgeon: FElam Dutch MD;  [facility];  Service: Cardiovascular;  Laterality: N/A;   ABDOMINAL AORTOGRAM W/LOWER EXTREMITY N/A 10/27/2020   Procedure: ABDOMINAL AORTOGRAM W/LOWER EXTREMITY;  Surgeon: CMarty Heck MD;  [facility];  Service: Cardiovascular;  Laterality: N/A;   ABDOMINAL AORTOGRAM W/LOWER EXTREMITY Right 07/26/2021   Procedure: ABDOMINAL AORTOGRAM W/LOWER EXTREMITY;   Surgeon: RBroadus Aidan MD;  [facility];  Service: Cardiovascular;  Laterality: Right;   AMPUTATION Left 06/28/2020   Procedure: AMPUTATION ABOVE KNEE LEFT;  Surgeon: ERosetta Posner MD;  [place] [facility]  Service: Vascular;  Laterality: Left;   [place], 2013   x2   BLADDER SURGERY     02   CATARACT EXTRACTION W/PHACO Right 05/12/2021   Procedure: CATARACT EXTRACTION PHACO AND INTRAOCULAR LENS PLACEMENT (  [place]);  Surgeon: Baruch Goldmann, MD;  Location: [facility];  Service: Ophthalmology;  Laterality: Right;  CDE: 27.28   CATARACT EXTRACTION W/PHACO Left 05/29/2021   Procedure: CATARACT EXTRACTION PHACO AND INTRAOCULAR LENS PLACEMENT ([facility]);  Surgeon: Baruch Goldmann, MD;  Location: [facility];  Service: Ophthalmology;  Laterality: Left;  CDE  12.96   CHOLECYSTECTOMY     COLONOSCOPY     COLONOSCOPY N/A 12/07/2014   Procedure: COLONOSCOPY;  Surgeon: Daneil Dolin, MD;  [facility];  Service: Endoscopy;  Laterality: N/A;  10:30 Am   ENDOVASCULAR REPAIR/STENT GRAFT Right 07/26/2021   Procedure: ENDOVASCULAR REPAIR/STENT GRAFT;  Surgeon: Broadus Tahjae, MD;  Location: [place] [facility];  Service: Cardiovascular;  Laterality: Right;  SFA   EYE SURGERY Bilateral    removed metal from eye   FEMORAL-TIBIAL BYPASS GRAFT Left 05/27/2020   Procedure: LEFT FEMORAL TO PERONEAL ARTERY BYPASS;  Surgeon: Rosetta Posner, MD;  Location: [place];  Service: Vascular;  Laterality: Left;   HERNIA REPAIR Right    INGUINAL HERNIA REPAIR Left 11/03/2013   Procedure: HERNIA REPAIR INGUINAL ADULT;  Surgeon: Gayland Curry, MD;  Location: [place];  Service: General;  Laterality: Left;   IRRIGATION AND DEBRIDEMENT FOOT Right 08/17/2021   Procedure: IRRIGATION AND DEBRIDEMENT ULCER RIGHT LEG APPLICATION SKIN SUBSTITUTE;  Surgeon: Criselda Peaches, DPM;  [facility]: [facility];  Service: Podiatry;  Laterality: Right;   PERIPHERAL VASCULAR BALLOON ANGIOPLASTY Right 07/26/2021   Procedure:  PERIPHERAL VASCULAR BALLOON ANGIOPLASTY;  Surgeon: Broadus Holten, MD;  Location: [place] [facility];  Service: Cardiovascular;  Laterality: Right;  PERONEAL   PERIPHERAL VASCULAR INTERVENTION  10/27/2020   Procedure: PERIPHERAL VASCULAR INTERVENTION;  Surgeon: Marty Heck, MD;  Location: [place] [facility];  Service: Cardiovascular;;   SHOULDER SURGERY     RIGHT SHOULDER    TRANSMETATARSAL AMPUTATION Right 08/17/2021   Procedure: TRANSMETATARSAL AMPUTATION;  Surgeon: Criselda Peaches, DPM;  [facility];  Service: Podiatry;  Laterality: Right;   VIDEO BRONCHOSCOPY WITH ENDOBRONCHIAL NAVIGATION N/A 07/10/2019   Procedure: VIDEO BRONCHOSCOPY WITH ENDOBRONCHIAL NAVIGATION;  Surgeon: Melrose Nakayama, MD;  [facility];  Service: Thoracic;  Laterality: N/A;    Social History Social History   Tobacco Use   Smoking status: Every Day    Packs/day: 0.50    Years: 45.00    Total pack years: 22.50    Types: Cigarettes   Smokeless tobacco: Never   Tobacco comments:    burns them up  Vaping Use   Vaping Use: Never used  Substance Use Topics   Alcohol use: No    Alcohol/week: 0.0 standard drinks of alcohol   Drug use: Yes    Types: Marijuana    Comment: almost daily for pain    Family History Family History  Problem Relation Age of Onset   Heart disease Mother        before age 49    Allergies  Allergen Reactions   Bee Venom Anaphylaxis   Codeine Other (See Comments)    incoherent  Other reaction(s): Delirium Other reaction(s): Delirium   Propoxyphene Other (See Comments)    Dizziness, "Makes me feel drunk" Other reaction(s): Dizziness Other reaction(s): Dizziness   Valsartan Other (See Comments)    incoherent Other reaction(s): Delirium Other reaction(s): Delirium   Physical Examination Vitals:   09/06/21 1245 09/06/21 1251 09/06/21 1300 09/06/21 1315  BP: 125/64 125/64 (!) 101/53 (!) 94/55  Pulse: 98 (!) 117  Resp: (!) '24 19 14 14  '$ Temp:  (!)  95.2 F (35.1 C)    TempSrc:  Rectal    SpO2: 98%     Weight:      Height:       Body mass index is 13.96 kg/m.  Cachectic gentleman in no distress Confused, not at mental baseline Tachycardic Unlabored breathing       Significant Diagnostic Studies: CBC Lab Results  Component Value Date   WBC 17.3 (H) 09/06/2021   HGB 5.6 (LL) 09/06/2021   HCT 16.7 (L) 09/06/2021   MCV 93.8 09/06/2021   PLT 594 (H) 09/06/2021    BMET    Component Value Date/Time   NA 134 (L) 09/06/2021 0929   K 4.9 09/06/2021 0929   CL 96 (L) 09/06/2021 0929   CO2 20 (L) 09/06/2021 0929   GLUCOSE 175 (H) 09/06/2021 0929   BUN 134 (H) 09/06/2021 0929   CREATININE 7.74 (H) 09/06/2021 0929   CREATININE 1.10 11/26/2019 0934   CALCIUM 9.0 09/06/2021 0929   GFRNONAA 7 (L) 09/06/2021 0929   GFRAA >60 07/10/2019 0702   Estimated Creatinine Clearance: 5.6 mL/min (A) (by C-G formula based on SCr of 7.74 mg/dL (H)).  COAG Lab Results  Component Value Date   INR 1.3 (H) 09/06/2021   INR 1.0 07/21/2021   INR 1.2 07/20/2021   ASSESSMENT/PLAN: 76 year old gentleman with advanced atherosclerotic disease now status post right transmetatarsal amputation and endovascular revascularization.   He has dry gangrene of the right leg.  He is profoundly anemic.  He is in acute renal failure.  I do not think he needs a emergent above-knee amputation.  I discussed the case with Dr. Carlis Abbott PCCM at the bedside.  We will try to resuscitate him and plan to do a right above-knee amputation later this week when he is better from a medical standpoint.  We will follow along closely.  [name]. Stanford Breed, MD [facility] Phone Number: [phone] 09/06/2021 1:48 PM]

## 2021-09-06 NOTE — ED Notes (Signed)
EDP notified of pt's temperature bare hugger applied

## 2021-09-06 NOTE — H&P (Addendum)
NAME:  Allen Yu, MRN:  [id number], DOB:  Jun 12, 1945, LOS: 0 ADMISSION DATE:  09/06/2021 CONSULTATION DATE: 09/06/2021 REFERRING MD:  Francia Greaves - EDP CHIEF COMPLAINT:  Sepsis, RLE ischemia   History of Present Illness:  76 year old man who presented to [facility] ED 8/16 at the suggestion of his PCP for significant lab abnormalities.  Recent abdominal aortogram and revascularization of RLE 07/26/2021 with Dr. Virl Cagey (VVS) and R TMA 08/17/2021 with Dr. Sherryle Lis (Podiatry).  History is obtained primarily from chart review and patient's wife at bedside, as patient has AMS superimposed on baseline dementia.  At baseline he normally eats 2-3 meals per day; occasionally he has poor PO intake/appetite, or will eat at less common times of day.  He is normally more interactive and post-TMA, patient initially was recovering well.  Patient's wife is his primary caretaker and he receives most of his care through the [place]. Per patient's wife, he had not been eating/drinking well for a few days and this is atypical for him.  She also noticed less UOP in his Foley bag (indwelling x 1 year). she contacted his PCP 8/14 requesting labs and proceeded to [place] the following day.  On patient's labs, hemoglobin was low at 6.5 and showed some electrolyte abnormalities as well as renal dysfunction.  They were advised to present to [facility] ED for further management.  Upon evaluation of patient's RLE and recent TMA surgical site, VVS consult was recommended and patient was subsequently transferred to [facility] ED for further evaluation.  On ED arrival, patient was hypothermic with rectal temperature 95.66F, tachycardic to 110s and hypotensive with MAP 60 (received 120 mcg Neo at [facility] prior to transfer).  Labs were notable for Hgb 5.6, WBC 17.3, platelets 594.  Additionally, patient was noted to have electrolyte abnormalities with Na 134 and renal dysfunction/acute renal failure with BUN 134 and creatinine 7.74 (previously 2.2 07/2021).  LFTs WNL.  INR  was slightly elevated at 1.3, ESR >140, LA WNL.  UA with large Hgb, 5 ketones, 100 protein, moderate leuks, no nitrites. BCx/UCx pending.  Of note, patient has history of E. faecalis and Enterobacter UTIs as well as E. coli/Enterobacter/Klebsiella bacteremia.  Broad-spectrum antibiotics were started and patient was fluid resuscitated with 3L LR; 3U PRBCs were ordered for hemoglobin 5.6.   PCCM consulted for ICU admission and further management.  Pertinent Medical History:   Past Medical History:  Diagnosis Date   Anemia    Arthritis    Carotid stenosis    Chronic back pain    Chronic HFrEF (heart failure with reduced ejection fraction) ([place])    a. 09/2020 Echo: EF 45%, mild LVH; b. 12/2020 Echo: EF 25-30%, glob HK, mod LVH, mildly reduced RV fxn, RVSP 65.20mHg, mod BAE. Triv effusion. Mod MR/TR.   CKD (chronic kidney disease), stage III ([facility]    Constipation    Dementia ([place]    Diabetes mellitus    Dilated cardiomyopathy ([facility]    a. 09/2020 Echo: EF 45%; b. 12/2020 Echo: EF 25-30%.   Foot drop, right    GERD (gastroesophageal reflux disease)    History of kidney stones    Hypertension    Lung nodule    PAF (paroxysmal atrial fibrillation) (HCC)    Peripheral neuropathy    Peripheral vascular disease ([place]    a. 06/2020 s/p L AKA; b. 10/2020 s/p R SFA and above-knee popliteal PTA.   PTSD (post-traumatic stress disorder)    Schizophrenia ([facility]    Tuberculosis  Treated   Significant Hospital Events: Including procedures, antibiotic start and stop dates in addition to other pertinent events   8/16 - Presented to [facility] ED for abnormal labs at the suggestion of PCP. C/f RLE ischemia and transfer to [facility] ED for VVS consult, also c/f sepsis. Hgb 5.6, ARF. VVS consult. PCCM admitting.  Interim History / Subjective:  PCCM consulted for evaluation/ICU admission  Objective:  Blood pressure 106/63, pulse (!) 118, temperature (!) 95.2 F (35.1 C), temperature source Rectal, resp. rate 15,  height 6' 1"  (1.854 m), weight 48 kg, SpO2 95 %.        Intake/Output Summary (Last 24 hours) at 09/06/2021 1356 Last data filed at 09/06/2021 1251 Gross per 24 hour  Intake 630 ml  Output --  Net 630 ml   Filed Weights   09/06/21 0922 09/06/21 0923  Weight: 43.9 kg 48 kg   Physical Examination: General: Acute-on-chronically ill-appearing elderly man in NAD. Appears uncomfortable. Significant cachexia/muscle wasting. HEENT: [place]/AT, anicteric sclera, +cataracts, PERRL, dry mucous membranes. +HOH Neuro:  Awake, baseline dementia but appears oriented to self/situation (with wife's prompting).  Responds to verbal stimuli. Following commands intermittently. Moves all 4 extremities spontaneously.  CV: Tachycardic, regular rhythm, no m/g/r. PULM: Breathing even and unlabored on RA. Lung fields CTAB anteriorly. GI: Soft, nontender, nondistended. Normoactive bowel sounds. Extremities: No LE edema noted. L AKA with well-healed surgical stump. R TMA with staples in place, mild adjacent swelling without induration, erythema or drainage. R anterior shin with ulceration +skin substitute stapled in place, c/f bony exposure. Skin: Warm/dry, no rashes. Ulceration of R anterior shin as above.  Resolved Hospital Problem List:    Assessment & Plan:  Septic shock in the setting of recent RLE TMA Recent R TMA with Dr. Sherryle Lis (Podiatry 7/27), initial recovery well. Also had skin substitute placed over anterior R tibia. Other possible sources of infection include UTI (indwelling catheter, multiple UTIs in recent years). - Admit to ICU for close monitoring - Goal MAP > 65 - Fluid resuscitation as tolerated, caution in the setting of CHF history but appears dry on exam - No pressors at present, may require initiation; will attempt IV fluid resuscitation first - Trend WBC, fever curve, LA - F/u Cx data - Continue broad-spectrum antibiotics  R lower extremity ischemia S/p RLE revascularization 7/5 (Dr.  Virl Cagey - VVS) S/p R TMA 7/27 (Dr. Sherryle Lis - Podiatry) S/p L AKA - VVS consulted, following; appreciate recommendations - Will require R AKA, limb is not salvageable at this juncture - R AKA non-emergent, will optimize patient for surgery prior to OR - Hold further DAPT in the setting of Hgb 5.6, ?OR intervention  Anemia of unclear source - possibly GIB superimposed on ACD versus marrow suppression in the setting of sepsis. Hgb 6.5 8/15, 5.6 8/16. - 3U PRBCs ordered 8/16 - Trend H&H - Monitor for signs of active bleeding - Transfuse for Hgb < 7.0 or hemodynamically significant bleeding - PPI BID in the event GIB is source - Check iron studies/B12  Acute renal failure on CKD stage III, likely ATN in the setting of hypotension/dehydration/?sepsis Chronic indwelling catheter History of UTIs (E. Faecalis, Enterobacter, E. Coli) Baseline Cr ~2.2 (last 7/5), 7.74 on admission to [facility] ED. Likely a poor dialysis candidate given multiple comorbidities. - Will attempt fluid resuscitation prior to any discussion re: dialysis - Hopeful that Cr/renal function will improve with rehydration - Trend BMP - Replete electrolytes as indicated - Monitor I&Os - F/u urine studies -  Avoid nephrotoxic agents as able - Ensure adequate renal perfusion - Nephro consult 8/17 if no significant improvement in renal function with rehydration  History of PAF CHA2DS2-VASc 6 (age, CHF, HTN, PAD, DM). - Cardiac monitoring - Not on AC at home per med rec, DAPT only  HFrEF Dilated cardiomyopathy Echo 06/2021 with EF 35-40%, moderately reduced LV function, global hypokinesis, G1DD. - F/u BNP - Consider repeat Echo - Hold home torsemide for now - Hold Toprol-XL, utilize low-dose metoprolol tartrate PRN for rate control if needed - Cardiac monitoring  History of HTN HLD - Home home antihypertensives at present, given labile BP - Continue statin  ?COPD Tobacco abuse PFTs from 06/2019 with some degree of airway  obstruction/diffusion deficit but no hyperinflation. - Supplemental O2 as needed to maintain sat > 90% - Pulmonary hygiene - BDs PRN - Declined nicotine patch - Encourage cessation  T2DM - Lantus 8U QHS, consider holding in the setting of sepsis/poor PO intake - SSI - CBGs Q4H - Goal CBG 140-180  GERD Home regimen: omeprazole. - Continue PPI  Risk for malnutrition Per patient's wife, poor PO intake intermittently; more pronounced over last few days. - Nutrition/RD consult for nutrition needs/calorie count - Possible enteral supplementation to optimize nutritional status as able for surgery - May require Cortrak  Schizophrenia - Continue risperidone BID PRN  Dementia HOH Physical debility - Continue home Aricept - Delirium precautions, as patient is high risk - PT/OT/SLP as clinically appropriate  Best Practice: (right click and "Reselect all SmartList Selections" daily)   Diet/type: NPO w/ oral meds DVT prophylaxis: SCDs and pharmacologic VTE ppx HELD in the setting of RLE wound and Hgb 5.6 GI prophylaxis: PPI Lines: N/A Foley:  Yes, and it is still needed - chronic indwelling catheter Code Status:  full code Last date of multidisciplinary goals of care discussion [Pending]  Labs:  CBC: Recent Labs  Lab 09/06/21 0929  WBC 17.3*  NEUTROABS 15.5*  HGB 5.6*  HCT 16.7*  MCV 93.8  PLT 568*   Basic Metabolic Panel: Recent Labs  Lab 09/06/21 0929  NA 134*  K 4.9  CL 96*  CO2 20*  GLUCOSE 175*  BUN 134*  CREATININE 7.74*  CALCIUM 9.0   GFR: Estimated Creatinine Clearance: 5.6 mL/min (A) (by C-G formula based on SCr of 7.74 mg/dL (H)). Recent Labs  Lab 09/06/21 0929  WBC 17.3*  LATICACIDVEN 1.2   Liver Function Tests: Recent Labs  Lab 09/06/21 0929  AST 23  ALT 13  ALKPHOS 104  BILITOT 0.7  PROT 8.1  ALBUMIN 3.1*   No results for input(s): "LIPASE", "AMYLASE" in the last 168 hours. No results for input(s): "AMMONIA" in the last 168  hours.  ABG:    Component Value Date/Time   HCO3 22.1 09/06/2021 1009   TCO2 27 07/26/2021 0931   ACIDBASEDEF 3.5 (H) 09/06/2021 1009   O2SAT 18.6 09/06/2021 1009    Coagulation Profile: Recent Labs  Lab 09/06/21 0929  INR 1.3*   Cardiac Enzymes: No results for input(s): "CKTOTAL", "CKMB", "CKMBINDEX", "TROPONINI" in the last 168 hours.  HbA1C: Hgb A1c MFr Bld  Date/Time Value Ref Range Status  07/04/2021 02:09 PM 6.0 (H) 4.8 - 5.6 % Final    Comment:    (NOTE) Pre diabetes:          5.7%-6.4%  Diabetes:              >6.4%  Glycemic control for   <7.0% adults with diabetes  01/14/2021 04:47 AM 8.8 (H) 4.8 - 5.6 % Final    Comment:    (NOTE) Pre diabetes:          5.7%-6.4%  Diabetes:              >6.4%  Glycemic control for   <7.0% adults with diabetes    CBG: No results for input(s): "GLUCAP" in the last 168 hours.  Review of Systems:   Review of systems completed with pertinent positives/negatives outlined in above HPI.  Past Medical History:  He,  has a past medical history of Anemia, Arthritis, Carotid stenosis, Chronic back pain, Chronic HFrEF (heart failure with reduced ejection fraction) ([place]), CKD (chronic kidney disease), stage III ([place]), Constipation, Dementia ([place]), Diabetes mellitus, Dilated cardiomyopathy ([place]), Foot drop, right, GERD (gastroesophageal reflux disease), History of kidney stones, Hypertension, Lung nodule, PAF (paroxysmal atrial fibrillation) ([place]), Peripheral neuropathy, Peripheral vascular disease ([place]), PTSD (post-traumatic stress disorder), Schizophrenia ([place]), and Tuberculosis.   Surgical History:   Past Surgical History:  Procedure Laterality Date   ABDOMINAL AORTOGRAM W/LOWER EXTREMITY N/A 05/20/2020   Procedure: ABDOMINAL AORTOGRAM W/LOWER EXTREMITY;  Surgeon: Elam Dutch, MD;  Location: [place] [facility];  Service: Cardiovascular;  Laterality: N/A;   ABDOMINAL AORTOGRAM W/LOWER EXTREMITY N/A 10/27/2020   Procedure:  ABDOMINAL AORTOGRAM W/LOWER EXTREMITY;  Surgeon: Marty Heck, MD;  Location: [place] [facility];  Service: Cardiovascular;  Laterality: N/A;   ABDOMINAL AORTOGRAM W/LOWER EXTREMITY Right 07/26/2021   Procedure: ABDOMINAL AORTOGRAM W/LOWER EXTREMITY;  Surgeon: Broadus Abrar, MD;  Location: [place] [facility];  Service: Cardiovascular;  Laterality: Right;   AMPUTATION Left 06/28/2020   Procedure: AMPUTATION ABOVE KNEE LEFT;  Surgeon: Rosetta Posner, MD;  Location: [place];  Service: Vascular;  Laterality: Left;   [place]  2000, 2013   x2   BLADDER SURGERY     02   CATARACT EXTRACTION W/PHACO Right 05/12/2021   Procedure: CATARACT EXTRACTION PHACO AND INTRAOCULAR LENS PLACEMENT ([place]);  Surgeon: Baruch Goldmann, MD;  Location: [facility];  Service: Ophthalmology;  Laterality: Right;  CDE: 27.28   CATARACT EXTRACTION W/PHACO Left 05/29/2021   Procedure: CATARACT EXTRACTION PHACO AND INTRAOCULAR LENS PLACEMENT ([facility]);  Surgeon: Baruch Goldmann, MD;  Location: [facility];  Service: Ophthalmology;  Laterality: Left;  CDE  12.96   CHOLECYSTECTOMY     COLONOSCOPY     COLONOSCOPY N/A 12/07/2014   Procedure: COLONOSCOPY;  Surgeon: Daneil Dolin, MD;  Location: [facility];  Service: Endoscopy;  Laterality: N/A;  10:30 Am   ENDOVASCULAR REPAIR/STENT GRAFT Right 07/26/2021   Procedure: ENDOVASCULAR REPAIR/STENT GRAFT;  Surgeon: Broadus Kingstyn, MD;  Location: [place] [facility] LAB;  Service: Cardiovascular;  Laterality: Right;  SFA   EYE SURGERY Bilateral    removed metal from eye   FEMORAL-TIBIAL BYPASS GRAFT Left 05/27/2020   Procedure: LEFT FEMORAL TO PERONEAL ARTERY BYPASS;  Surgeon: Rosetta Posner, MD;  Location: [place];  Service: Vascular;  Laterality: Left;   HERNIA REPAIR Right    INGUINAL HERNIA REPAIR Left 11/03/2013   Procedure: HERNIA REPAIR INGUINAL ADULT;  Surgeon: Gayland Curry, MD;  Location: [place];  Service: General;  Laterality: Left;   IRRIGATION AND DEBRIDEMENT FOOT Right  08/17/2021   Procedure: IRRIGATION AND DEBRIDEMENT ULCER RIGHT LEG APPLICATION SKIN SUBSTITUTE;  Surgeon: Criselda Peaches, DPM;  Location: [facility];  Service: Podiatry;  Laterality: Right;   PERIPHERAL VASCULAR BALLOON ANGIOPLASTY Right 07/26/2021  Procedure: PERIPHERAL VASCULAR BALLOON ANGIOPLASTY;  Surgeon: Broadus Axell, MD;  Location: [place] [facility];  Service: Cardiovascular;  Laterality: Right;  PERONEAL   PERIPHERAL VASCULAR INTERVENTION  10/27/2020   Procedure: PERIPHERAL VASCULAR INTERVENTION;  Surgeon: Marty Heck, MD;  Location: [place] [facility];  Service: Cardiovascular;;   SHOULDER SURGERY     RIGHT SHOULDER    TRANSMETATARSAL AMPUTATION Right 08/17/2021   Procedure: TRANSMETATARSAL AMPUTATION;  Surgeon: Criselda Peaches, DPM;  Location: [facility];  Service: Podiatry;  Laterality: Right;   VIDEO BRONCHOSCOPY WITH ENDOBRONCHIAL NAVIGATION N/A 07/10/2019   Procedure: VIDEO BRONCHOSCOPY WITH ENDOBRONCHIAL NAVIGATION;  Surgeon: Melrose Nakayama, MD;  Location: [place];  Service: Thoracic;  Laterality: N/A;   Social History:   reports that he has been smoking cigarettes. He has a 22.50 pack-year smoking history. He has never used smokeless tobacco. He reports current drug use. Drug: Marijuana. He reports that he does not drink alcohol.   Family History:  His family history includes Heart disease in his mother.   Allergies: Allergies  Allergen Reactions   Bee Venom Anaphylaxis   Codeine Other (See Comments)    incoherent  Other reaction(s): Delirium Other reaction(s): Delirium   Propoxyphene Other (See Comments)    Dizziness, "Makes me feel drunk" Other reaction(s): Dizziness Other reaction(s): Dizziness   Valsartan Other (See Comments)    incoherent Other reaction(s): Delirium Other reaction(s): Delirium   Home Medications: Prior to Admission medications   Medication Sig Start Date End Date Taking? Authorizing Provider  acetaminophen (TYLENOL) 500 MG tablet  Take 500 mg by mouth 2 (two) times daily as needed for moderate pain. 03/07/21  Yes [provider]  aspirin EC 81 MG tablet Take 1 tablet (81 mg total) by mouth daily with breakfast. 07/07/21 07/07/22 Yes Emokpae, Courage, MD  atorvastatin (LIPITOR) 40 MG tablet Take 1 tablet (40 mg total) by mouth at bedtime. 07/14/21 10/12/21 Yes Strader, Fransisco Hertz, PA-C  clopidogrel (PLAVIX) 75 MG tablet Take 1 tablet (75 mg total) by mouth daily. 07/07/21 07/07/22 Yes Emokpae, Courage, MD  donepezil (ARICEPT) 10 MG tablet Take 10 mg by mouth at bedtime.   Yes [provider]  doxycycline (VIBRA-TABS) 100 MG tablet Take 1 tablet (100 mg total) by mouth 2 (two) times daily. 08/31/21  Yes McDonald, Stephan Minister, DPM  ferrous sulfate 325 (65 FE) MG tablet Take 325 mg by mouth daily with breakfast. 03/07/21  Yes [provider]  gabapentin (NEURONTIN) 100 MG capsule Take 1 capsule (100 mg total) by mouth 2 (two) times daily. 07/07/21  Yes Emokpae, Courage, MD  LANTUS 100 UNIT/ML injection Inject 0.08 mLs (8 Units total) into the skin at bedtime. 07/07/21  Yes Emokpae, Courage, MD  losartan (COZAAR) 50 MG tablet Take 1 tablet (50 mg total) by mouth daily. 07/07/21 07/02/22 Yes Emokpae, Courage, MD  metoprolol succinate (TOPROL-XL) 50 MG 24 hr tablet Take 1 tablet (50 mg total) by mouth daily. Take with or immediately following a meal. 06/26/21 06/21/22 Yes Strader, [place] M, PA-C  mirtazapine (REMERON) 15 MG tablet TAKE ONE-HALF TABLET BY MOUTH AT BEDTIME FOR APPETITE 02/11/21 09/15/21 Yes [provider]  Multiple Vitamins-Minerals (MULTIVITAMIN WITH MINERALS) tablet Take 1 tablet by mouth daily. 01/19/21 01/19/22 Yes Emokpae, Courage, MD  omeprazole (PRILOSEC) 20 MG capsule Take 1 capsule by mouth daily. 03/23/21  Yes [provider]  ondansetron (ZOFRAN) 4 MG tablet Take 1 tablet (4 mg total) by mouth every 6 (six) hours as needed  for nausea. 01/19/21  Yes Emokpae, Courage, MD   oxyCODONE-acetaminophen (PERCOCET) 5-325 MG tablet Take 1 tablet by mouth every 4 (four) hours as needed for severe pain. 08/17/21 08/17/22 Yes McDonald, Stephan Minister, DPM  risperiDONE (RISPERDAL) 0.5 MG tablet Take 1 tablet (0.5 mg total) by mouth every 12 (twelve) hours as needed (Restlessness and agitation). 01/19/21  Yes Emokpae, Courage, MD  senna-docusate (SENEXON-S) 8.6-50 MG tablet TAKE 2 TABLETS BY MOUTH DAILY AS NEEDED FOR CONSTIPATION 08/29/21 08/30/22 Yes [provider]  torsemide 40 MG TABS Take 40 mg by mouth every morning. 07/07/21  Yes Emokpae, Courage, MD  traMADol (ULTRAM) 50 MG tablet Take 50 mg by mouth every 6 (six) hours as needed for moderate pain. 07/18/20  Yes [provider]  Alcohol Swabs (ALCOHOL PADS) 70 % PADS USE 1 PAD AS DIRECTED 10/12/20 10/13/21  [provider]  B-D UF III MINI PEN NEEDLES 31G X 5 MM MISC SMARTSIG:1 Each SUB-Q Daily 12/23/19   [provider]  feeding supplement, ENSURE COMPLETE, (ENSURE COMPLETE) LIQD Take 237 mLs by mouth 2 (two) times daily between meals. 01/19/21   Roxan Hockey, MD    Critical care time: 46 minutes   Lestine Mount, PA-C [facility] 09/06/21 1:56 PM  Please see [email or web address] for pager details.  From 7A-7P if no response, please call [phone] After hours, please call ELink [phone]

## 2021-09-06 NOTE — ED Notes (Signed)
Pt agitated and unwilling to let RN's attempt [place] IV for levophed. Phenylephrine ordered and given per verbal order of Dr. Matilde Sprang.

## 2021-09-06 NOTE — ED Triage Notes (Signed)
Patient is Allen Yu. Per wife, patient has been sleeping and not eating a lot for 4-5 days and patient had Dr. Willey Blade draw patient's blood panel. Per results patient has CR of 8, hemoglobin of 6.5 and elevated WBC. Patient recently had all 5 toes amputated on right foot 7/27. Chronic foley.

## 2021-09-06 NOTE — Progress Notes (Signed)
I discussed the case with Dr. Matilde Sprang. After review of chart I believe this patient needs a BKA given the infection/PAD. Reccommended vascular or ortho consult. If we can be of assistance, please let us know.   Celesta Gentile, DPM

## 2021-09-06 NOTE — ED Notes (Signed)
Report given to [facility] Charge RN Carlis Abbott R. [facility] called for emergent transfer.

## 2021-09-06 NOTE — ED Notes (Signed)
Report taken from [facility], pt cleaned and changed. VSS

## 2021-09-06 NOTE — Progress Notes (Signed)
Pharmacy Antibiotic Note  Allen Yu is a 76 y.o. male admitted on 09/06/2021 with  osteomyelitis .  Pharmacy has been consulted for vancomycin dosing.  Plan: Vancomycin 1000 mg IV x 1 dose. Further doses pending renal function/levels. Cefepime 2000 mg IV x 1 dose. F/U additional gram negative coverage. Monitor labs, c/s, and vanco levels as indicated.  Height: '6\' 1"'$  (185.4 cm) Weight: 48 kg (105 lb 13.1 oz) IBW/kg (Calculated) : 79.9  Temp (24hrs), Avg:97.6 F (36.4 C), Min:97.4 F (36.3 C), Max:97.9 F (36.6 C)  Recent Labs  Lab 09/06/21 0929  WBC 17.3*  CREATININE 7.74*  LATICACIDVEN 1.2    Estimated Creatinine Clearance: 5.6 mL/min (A) (by C-G formula based on SCr of 7.74 mg/dL (H)).    Allergies  Allergen Reactions   Bee Venom Anaphylaxis   Codeine Other (See Comments)    incoherent  Other reaction(s): Delirium Other reaction(s): Delirium   Propoxyphene Other (See Comments)    Dizziness, "Makes me feel drunk" Other reaction(s): Dizziness Other reaction(s): Dizziness   Valsartan Other (See Comments)    incoherent Other reaction(s): Delirium Other reaction(s): Delirium    Antimicrobials this admission: Vanco 8/16 >> Cefepime 8/16 Clindamycin 8/16   Microbiology results: 8/16 BCx: pending 8/16 UCx: pending    Thank you for allowing pharmacy to be a part of this patient's care.  Margot Ables, PharmD Clinical Pharmacist 09/06/2021 11:24 AM

## 2021-09-06 NOTE — ED Notes (Signed)
Date and time results received: 09/06/21 1025 (use smartphrase ".now" to insert current time)  Test: Venous Blood Gas Critical Value: PO2 - <31  Name of Provider Notified: Dr. Matilde Sprang  Orders Received? Or Actions Taken?:  No new orders at this time

## 2021-09-06 NOTE — Progress Notes (Signed)
[name] Progress Note Patient Name: Allen Yu DOB: 1945/09/01 MRN: [id number]   Date of Service  09/06/2021  HPI/Events of Note  Severe Agitation - Patient refusing meds, care and nursing interventions. Patient swinging at and cursing at staff. QTc interval = 0.478 seconds.   eICU Interventions  Plan: Haldol 2 mg IV X 1 now.     Intervention Category Major Interventions: Delirium, psychosis, severe agitation - evaluation and management  Rankin Coolman [place] 09/06/2021, 10:18 PM

## 2021-09-06 NOTE — ED Notes (Signed)
RN cleaned pt and applied new breif. PT became aggressive with staff. Pt also removed 1 of his IV's RN attmpted to get another and failed IV team consult placed

## 2021-09-06 NOTE — ED Provider Notes (Signed)
[facility] EMERGENCY DEPARTMENT Provider Note  CSN: [id number] Arrival date & time: 09/06/21 7209  Chief Complaint(s) Weakness  HPI Allen Yu is a 76 y.o. male with PMH dementia, CHF with last EF in June 2023 of 35 to 40%, paroxysmal A-fib, severe peripheral vascular disease with recent transmetatarsal amputation on the right on 08/17/2021 by Lanae Crumbly of podiatry who presents emergency department for evaluation of abnormal labs.  Patient saw his primary care physician who obtained outpatient labs showing a hemoglobin of 6.5, leukocytosis, creatinine elevation to 8 with a BUN of 103.  Patient then sent to the ER for further evaluation.  On arrival, patient is tachycardic with soft blood pressures and new hemorrhagic bulla on the right foot.  Additional history unable to be obtained secondary to the patient's dementia.    Past Medical History Past Medical History:  Diagnosis Date   Anemia    Arthritis    Carotid stenosis    Chronic back pain    Chronic HFrEF (heart failure with reduced ejection fraction) ([place])    a. 09/2020 Echo: EF 45%, mild LVH; b. 12/2020 Echo: EF 25-30%, glob HK, mod LVH, mildly reduced RV fxn, RVSP 65.48mHg, mod BAE. Triv effusion. Mod MR/TR.   CKD (chronic kidney disease), stage III (H[place]    Constipation    Dementia (HE[place]    Diabetes mellitus    Dilated cardiomyopathy (HL[place]    a. 09/2020 Echo: EF 45%; b. 12/2020 Echo: EF 25-30%.   Foot drop, right    GERD (gastroesophageal reflux disease)    History of kidney stones    Hypertension    Lung nodule    PAF (paroxysmal atrial fibrillation) (HCC)    Peripheral neuropathy    Peripheral vascular disease ([place]    a. 06/2020 s/p L AKA; b. 10/2020 s/p R SFA and above-knee popliteal PTA.   PTSD (post-traumatic stress disorder)    Schizophrenia (HR[place]    Tuberculosis    Treated   Patient Active Problem List   Diagnosis Date Noted   Ulcer of right lower extremity with bone involvement without evidence of  necrosis (HChippewa [place]    AKI (acute kidney injury) (HP[place] 07/21/2021   Complete traumatic amputation at level between unspecified hip and knee, initial encounter (HAcalanes [place] 07/17/2021   Need for assistance with personal care 07/17/2021   Persons encountering health services in other specified circumstances 07/17/2021   Type 2 diabetes mellitus with hyperglycemia (H[place] 07/17/2021   Hypokalemia 04/13/2021   Colitis 04/12/2021   Bacteremia 04/11/2021   Pneumonia 047/09/6281  Chronic systolic CHF (congestive heart failure) ([place] 04/11/2021   Acute respiratory failure with hypoxia and hypercarbia (HCC) 01/23/2021   CKD (chronic kidney disease) stage 3, GFR 30-59 ml/min (HCC) 01/23/2021   Acute on chronic systolic CHF (congestive heart failure) (HAshland 01/23/2021   Acute respiratory failure with hypoxia (HR[place] 01/13/2021   Encounter for fitting and adjustment of hearing aid 12/21/2020   Other specified problems related to psychosocial circumstances 11/21/2020   Sensorineural hearing loss, bilateral 11/21/2020   Tobacco dependence 10/06/2020   Benign prostatic hyperplasia with urinary obstruction 10/06/2020   Above knee amputation of left lower extremity (HBer[place] 10/06/2020   Other specified counseling 10/06/2020   Severe sepsis (HTwins[place] 09/24/2020   Schizophrenia, unspecified (HH[place] 09/24/2020   Unspecified dementia, unspecified severity, without behavioral disturbance, psychotic disturbance, mood disturbance, and anxiety (HMitchell[place] 09/24/2020   Lung nodule 09/24/2020   Indwelling Foley catheter present 09/24/2020   Peripheral vascular disease (HMcLeod 09/24/2020   Mass  of testicle 09/24/2020   Problem related to unspecified psychosocial circumstances 08/01/2020   Hypermetropia 07/22/2020   Unspecified atrial fibrillation ([place]) 07/22/2020   Klebsiella sepsis ([place]) 07/22/2020   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   SIRS (systemic inflammatory response syndrome) ([place]) 58/09/9831   Acute metabolic encephalopathy 82/50/5397    Hyponatremia    S/P AKA (above knee amputation) unilateral, left ([place]) 06/28/2020   Gangrene of right foot ([place]) 06/28/2020   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 ([place]) 05/27/2020   Critical lower limb ischemia ([place]) 05/21/2020   Malnutrition ([place]) 05/19/2020   Heart failure, unspecified ([place]) 01/23/2020   Plantar callus 12/24/2019   Cavitary pneumonia 09/14/2019   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   Unintentional weight loss 09/14/2019   Coronary artery calcification seen on CT scan 10/28/2018   Hilar adenopathy 04/10/2016   Lung nodule seen on imaging study 07/09/2015   Acute encephalopathy    Hypoglycemia due to insulin    Type 2 diabetes mellitus with other circulatory complications ([place]) 67/34/1937   Acute kidney injury superimposed on chronic kidney disease ([place]) 07/08/2015   Tobacco use disorder 07/08/2015   Diverticulosis of colon without hemorrhage    Left inguinal hernia 09/16/2013   Altered mental status 11/17/2012   Hypothermia 11/17/2012   Leukocytosis 11/17/2012   Chronic kidney disease, stage 3 unspecified ([place])    Post-traumatic stress disorder, chronic    GERD (gastroesophageal reflux disease)    Essential (primary) hypertension 09/10/2011   Anemia 09/10/2011   Diabetic neuropathy ([place]) 09/10/2011   DDD (degenerative disc disease), lumbar 09/10/2011   Psychosis ([place]) 09/10/2011   Diabetes mellitus type 2, uncontrolled 09/10/2011   Hyperlipemia 09/10/2011   Microalbuminuria 09/10/2011   Home Medication(s) Prior to Admission medications   Medication Sig Start Date End Date Taking? Authorizing Provider  acetaminophen (TYLENOL) 500 MG tablet Take 500 mg by mouth 2 (two) times daily as needed for moderate pain. 03/07/21  Yes [provider]  aspirin EC 81 MG tablet Take 1 tablet (81 mg total) by mouth daily with breakfast. 07/07/21 07/07/22 Yes Emokpae, Courage, MD  atorvastatin (LIPITOR) 40 MG tablet Take 1 tablet (40 mg  total) by mouth at bedtime. 07/14/21 10/12/21 Yes Strader, Fransisco Hertz, PA-C  clopidogrel (PLAVIX) 75 MG tablet Take 1 tablet (75 mg total) by mouth daily. 07/07/21 07/07/22 Yes Emokpae, Courage, MD  donepezil (ARICEPT) 10 MG tablet Take 10 mg by mouth at bedtime.   Yes [provider]  doxycycline (VIBRA-TABS) 100 MG tablet Take 1 tablet (100 mg total) by mouth 2 (two) times daily. 08/31/21  Yes McDonald, Stephan Minister, DPM  ferrous sulfate 325 (65 FE) MG tablet Take 325 mg by mouth daily with breakfast. 03/07/21  Yes [provider]  gabapentin (NEURONTIN) 100 MG capsule Take 1 capsule (100 mg total) by mouth 2 (two) times daily. 07/07/21  Yes Emokpae, Courage, MD  LANTUS 100 UNIT/ML injection Inject 0.08 mLs (8 Units total) into the skin at bedtime. 07/07/21  Yes Emokpae, Courage, MD  losartan (COZAAR) 50 MG tablet Take 1 tablet (50 mg total) by mouth daily. 07/07/21 07/02/22 Yes Emokpae, Courage, MD  metoprolol succinate (TOPROL-XL) 50 MG 24 hr tablet Take 1 tablet (50 mg total) by mouth daily. Take with or immediately following a meal. 06/26/21 06/21/22 Yes Strader, [place] M, PA-C  mirtazapine (REMERON) 15 MG tablet TAKE ONE-HALF TABLET BY MOUTH AT BEDTIME FOR APPETITE 02/11/21 09/15/21 Yes [provider]  Multiple Vitamins-Minerals (MULTIVITAMIN WITH MINERALS) tablet Take 1  tablet by mouth daily. 01/19/21 01/19/22 Yes Emokpae, Courage, MD  omeprazole (PRILOSEC) 20 MG capsule Take 1 capsule by mouth daily. 03/23/21  Yes [provider]  ondansetron (ZOFRAN) 4 MG tablet Take 1 tablet (4 mg total) by mouth every 6 (six) hours as needed for nausea. 01/19/21  Yes Emokpae, Courage, MD  oxyCODONE-acetaminophen (PERCOCET) 5-325 MG tablet Take 1 tablet by mouth every 4 (four) hours as needed for severe pain. 08/17/21 08/17/22 Yes McDonald, Stephan Minister, DPM  risperiDONE (RISPERDAL) 0.5 MG tablet Take 1 tablet (0.5 mg total) by mouth every 12 (twelve) hours as needed (Restlessness and agitation).  01/19/21  Yes Emokpae, Courage, MD  senna-docusate (SENEXON-S) 8.6-50 MG tablet TAKE 2 TABLETS BY MOUTH DAILY AS NEEDED FOR CONSTIPATION 08/29/21 08/30/22 Yes [provider]  torsemide 40 MG TABS Take 40 mg by mouth every morning. 07/07/21  Yes Emokpae, Courage, MD  traMADol (ULTRAM) 50 MG tablet Take 50 mg by mouth every 6 (six) hours as needed for moderate pain. 07/18/20  Yes [provider]  Alcohol Swabs (ALCOHOL PADS) 70 % PADS USE 1 PAD AS DIRECTED 10/12/20 10/13/21  [provider]  B-D UF III MINI PEN NEEDLES 31G X 5 MM MISC SMARTSIG:1 Each SUB-Q Daily 12/23/19   [provider]  feeding supplement, ENSURE COMPLETE, (ENSURE COMPLETE) LIQD Take 237 mLs by mouth 2 (two) times daily between meals. 01/19/21   Roxan Hockey, MD                                                                                                                                    Past Surgical History Past Surgical History:  Procedure Laterality Date   ABDOMINAL AORTOGRAM W/LOWER EXTREMITY N/A 05/20/2020   Procedure: ABDOMINAL AORTOGRAM W/LOWER EXTREMITY;  Surgeon: Elam Dutch, MD;  Location: [facility];  Service: Cardiovascular;  Laterality: N/A;   ABDOMINAL AORTOGRAM W/LOWER EXTREMITY N/A 10/27/2020   Procedure: ABDOMINAL AORTOGRAM W/LOWER EXTREMITY;  Surgeon: Marty Heck, MD;  Location: [facility];  Service: Cardiovascular;  Laterality: N/A;   ABDOMINAL AORTOGRAM W/LOWER EXTREMITY Right 07/26/2021   Procedure: ABDOMINAL AORTOGRAM W/LOWER EXTREMITY;  Surgeon: Broadus Shakim, MD;  Location: [facility];  Service: Cardiovascular;  Laterality: Right;   AMPUTATION Left 06/28/2020   Procedure: AMPUTATION ABOVE KNEE LEFT;  Surgeon: Rosetta Posner, MD;  Location: [facility];  Service: Vascular;  Laterality: Left;   [place]  2000, 2013   x2   BLADDER SURGERY     02   CATARACT EXTRACTION W/PHACO Right 05/12/2021   Procedure: CATARACT  EXTRACTION PHACO AND INTRAOCULAR LENS PLACEMENT ([place]);  Surgeon: Baruch Goldmann, MD;  Location: [facility];  Service: Ophthalmology;  Laterality: Right;  CDE: 27.28   CATARACT EXTRACTION W/PHACO Left 05/29/2021   Procedure: CATARACT EXTRACTION PHACO AND INTRAOCULAR LENS PLACEMENT (IOC);  Surgeon: Baruch Goldmann, MD;  Location: [facility];  Service: Ophthalmology;  Laterality: Left;  CDE  12.96   CHOLECYSTECTOMY     COLONOSCOPY     COLONOSCOPY N/A 12/07/2014   Procedure: COLONOSCOPY;  Surgeon: Daneil Dolin, MD;  [facility];  Service: Endoscopy;  Laterality: N/A;  10:30 Am   ENDOVASCULAR REPAIR/STENT GRAFT Right 07/26/2021   Procedure: ENDOVASCULAR REPAIR/STENT GRAFT;  Surgeon: Broadus Crue, MD;  Location: [facility];  Service: Cardiovascular;  Laterality: Right;  SFA   EYE SURGERY Bilateral    removed metal from eye   FEMORAL-TIBIAL BYPASS GRAFT Left 05/27/2020   Procedure: LEFT FEMORAL TO PERONEAL ARTERY BYPASS;  Surgeon: Rosetta Posner, MD;  Location: [facility];  Service: Vascular;  Laterality: Left;   HERNIA REPAIR Right    INGUINAL HERNIA REPAIR Left 11/03/2013   Procedure: HERNIA REPAIR INGUINAL ADULT;  Surgeon: Gayland Curry, MD;  Location: [facility];  Service: General;  Laterality: Left;   IRRIGATION AND DEBRIDEMENT FOOT Right 08/17/2021   Procedure: IRRIGATION AND DEBRIDEMENT ULCER RIGHT LEG APPLICATION SKIN SUBSTITUTE;  Surgeon: Criselda Peaches, DPM;  Location: [facility];  Service: Podiatry;  Laterality: Right;   PERIPHERAL VASCULAR BALLOON ANGIOPLASTY Right 07/26/2021   Procedure: PERIPHERAL VASCULAR BALLOON ANGIOPLASTY;  Surgeon: Broadus Leondre, MD;  Location: [facility];  Service: Cardiovascular;  Laterality: Right;  PERONEAL   PERIPHERAL VASCULAR INTERVENTION  10/27/2020   Procedure: PERIPHERAL VASCULAR INTERVENTION;  Surgeon: Marty Heck, MD;  Location: [facility];  Service: Cardiovascular;;   SHOULDER SURGERY     RIGHT SHOULDER    TRANSMETATARSAL  AMPUTATION Right 08/17/2021   Procedure: TRANSMETATARSAL AMPUTATION;  Surgeon: Criselda Peaches, DPM;  [facility];  Service: Podiatry;  Laterality: Right;   VIDEO BRONCHOSCOPY WITH ENDOBRONCHIAL NAVIGATION N/A 07/10/2019   Procedure: VIDEO BRONCHOSCOPY WITH ENDOBRONCHIAL NAVIGATION;  Surgeon: Melrose Nakayama, MD;  Location[facility];  Service: Thoracic;  Laterality: N/A;   Family History Family History  Problem Relation Age of Onset   Heart disease Mother        before age 20    Social History Social History   Tobacco Use   Smoking status: Every Day    Packs/day: 0.50    Years: 45.00    Total pack years: 22.50    Types: Cigarettes   Smokeless tobacco: Never   Tobacco comments:    burns them up  Vaping Use   Vaping Use: Never used  Substance Use Topics   Alcohol use: No    Alcohol/week: 0.0 standard drinks of alcohol   Drug use: Yes    Types: Marijuana    Comment: almost daily for pain   Allergies Bee venom, Codeine, Propoxyphene, and Valsartan  Review of Systems Review of Systems  Unable to perform ROS: Dementia    Physical Exam Vital Signs  I have reviewed the triage vital signs BP 103/62   Pulse (!) 123   Temp (!) 97.4 F (36.3 C) (Oral)   Resp 15   Ht '6\' 1"'$  (1.854 m)   Wt 48 kg   SpO2 100%   BMI 13.96 kg/m   Physical Exam Constitutional:      General: He is not in acute distress.    Appearance: Normal appearance. He is ill-appearing.  HENT:     Head: Normocephalic and atraumatic.     Nose: No congestion or rhinorrhea.  Eyes:     General:        Right eye: No  discharge.        Left eye: No discharge.     Extraocular Movements: Extraocular movements intact.     Pupils: Pupils are equal, round, and reactive to light.  Cardiovascular:     Rate and Rhythm: Regular rhythm. Tachycardia present.     Heart sounds: No murmur heard. Pulmonary:     Effort: No respiratory distress.     Breath sounds: No wheezing or rales.  Abdominal:      General: There is no distension.     Tenderness: There is no abdominal tenderness.  Musculoskeletal:        General: Swelling and tenderness present. Normal range of motion.     Cervical back: Normal range of motion.  Skin:    General: Skin is warm and dry.     Findings: Erythema and lesion present.  Neurological:     General: No focal deficit present.     Mental Status: He is alert.     ED Results and Treatments Labs (all labs ordered are listed, but only abnormal results are displayed) Labs Reviewed  COMPREHENSIVE METABOLIC PANEL - Abnormal; Notable for the following components:      Result Value   Sodium 134 (*)    Chloride 96 (*)    CO2 20 (*)    Glucose, Bld 175 (*)    BUN 134 (*)    Creatinine, Ser 7.74 (*)    Albumin 3.1 (*)    GFR, Estimated 7 (*)    Anion gap 18 (*)    All other components within normal limits  CBC WITH DIFFERENTIAL/PLATELET - Abnormal; Notable for the following components:   WBC 17.3 (*)    RBC 1.78 (*)    Hemoglobin 5.6 (*)    HCT 16.7 (*)    Platelets 594 (*)    Neutro Abs 15.5 (*)    Abs Immature Granulocytes 0.10 (*)    All other components within normal limits  PROTIME-INR - Abnormal; Notable for the following components:   Prothrombin Time 16.5 (*)    INR 1.3 (*)    All other components within normal limits  APTT - Abnormal; Notable for the following components:   aPTT 21 (*)    All other components within normal limits  BLOOD GAS, VENOUS - Abnormal; Notable for the following components:   pCO2, Ven 40 (*)    pO2, Ven <31 (*)    Acid-base deficit 3.5 (*)    All other components within normal limits  CULTURE, BLOOD (ROUTINE X 2)  CULTURE, BLOOD (ROUTINE X 2)  URINE CULTURE  LACTIC ACID, PLASMA  LACTIC ACID, PLASMA  URINALYSIS, ROUTINE W REFLEX MICROSCOPIC  SEDIMENTATION RATE  C-REACTIVE PROTEIN  TYPE AND SCREEN  PREPARE RBC (CROSSMATCH)                                                                                                                           Radiology DG Tibia/Fibula Right  Result Date:  09/06/2021 CLINICAL DATA:  Post recent RIGHT partial foot amputation, redness of foot and lower leg, diabetes mellitus, concern for necrotizing fasciitis or osteomyelitis EXAM: RIGHT TIBIA AND FIBULA - 2 VIEW COMPARISON:  None available FINDINGS: Osseous demineralization. Knee and ankle joint alignments normal. No fracture, dislocation or bone destruction. Skin clips at anterior lower leg. Extensive atherosclerotic calcifications with vascular stents at popliteal artery. IMPRESSION: No acute osseous abnormalities. Electronically Signed   By: Lavonia Dana M.D.   On: 09/06/2021 11:00   DG Chest Port 1 View  Result Date: 09/06/2021 CLINICAL DATA:  Weakness.  Possible sepsis. EXAM: PORTABLE CHEST 1 VIEW COMPARISON:  08/16/2021 FINDINGS: The lungs are clear without focal pneumonia, edema, pneumothorax or pleural effusion. The cardiopericardial silhouette is within normal limits for size. The visualized bony structures of the thorax are unremarkable. Telemetry leads overlie the chest. IMPRESSION: Stable.  No acute findings. Electronically Signed   By: Misty Stanley M.D.   On: 09/06/2021 10:11    Pertinent labs & imaging results that were available during my care of the patient were reviewed by me and considered in my medical decision making (see MDM for details).  Medications Ordered in ED Medications  0.9 %  sodium chloride infusion (Manually program via Guardrails IV Fluids) (has no administration in time range)  vancomycin (VANCOCIN) IVPB 1000 mg/200 mL premix (has no administration in time range)  ceFEPIme (MAXIPIME) 2 g in sodium chloride 0.9 % 100 mL IVPB (2 g Intravenous New Bag/Given 09/06/21 1037)  clindamycin (CLEOCIN) capsule 300 mg (300 mg Oral Given 09/06/21 1027)  lactated ringers bolus 1,000 mL (1,000 mLs Intravenous New Bag/Given 09/06/21 1021)                                                                                                                                      Procedures .Critical Care  Performed by: Teressa Lower, MD Authorized by: Teressa Lower, MD   Critical care provider statement:    Critical care time (minutes):  80   Critical care was necessary to treat or prevent imminent or life-threatening deterioration of the following conditions: Necrotizing fasciitis, sepsis, critical anemia.   Critical care was time spent personally by me on the following activities:  Development of treatment plan with patient or surrogate, discussions with consultants, evaluation of patient's response to treatment, examination of patient, ordering and review of laboratory studies, ordering and review of radiographic studies, ordering and performing treatments and interventions, pulse oximetry, re-evaluation of patient's condition and review of old charts   (including critical care time)  Medical Decision Making / ED Course   This patient presents to the ED for concern of right leg infection, abnormal labs, this involves an extensive number of treatment options, and is a complaint that carries with it a high risk of complications and morbidity.  The differential diagnosis includes necrotizing fasciitis, septic shock, sepsis, osteomyelitis  MDM: Patient seen emergency  room for evaluation of multiple complaints described above.  Physical exam reveals swelling and a new hemorrhagic bulla over the dorsal surface of the stump on the right lower extremity.  Patient arrives tachycardic with softer blood pressures and sepsis alert immediately called.  Due to clinical concern for necrotizing fasciitis, Vanco, cefepime and oral clindamycin was started.  Oral clindamycin has the same viability as IV clindamycin and we have a critical shortage of IV clindamycin in the hospital here.  Initial laboratory evaluation with a leukocytosis to 17.3, hemoglobin 5.6, platelet count 594, BUN elevated to 134, creatinine 7.74 which  is a significant elevation for this patient.  Patient dropped his blood pressures in the 70s while here and gentle fluid resuscitation was initiated given his EF of 35%.  3 units packed red blood cells also ordered for his critical anemia.  X-ray foot with small foci of gas in the foot but this does not track up the leg.  I consulted podiatry and spoke with Dr. Jacqualyn Posey who states that the patient is likely going to need an emergent BKA and the patient would benefit from vascular consultation.  I spoke with Dr. Luan Pulling of vascular who recommends emergent transfer to [facility] for vascular surgery evaluation in the emergency department.  Patient then transferred ED to ED for emergent vascular consultation.   Additional history obtained: -Additional history obtained from wife -External records from outside source obtained and reviewed including: Chart review including previous notes, labs, imaging, consultation notes   Lab Tests: -I ordered, reviewed, and interpreted labs.   The pertinent results include:   Labs Reviewed  COMPREHENSIVE METABOLIC PANEL - Abnormal; Notable for the following components:      Result Value   Sodium 134 (*)    Chloride 96 (*)    CO2 20 (*)    Glucose, Bld 175 (*)    BUN 134 (*)    Creatinine, Ser 7.74 (*)    Albumin 3.1 (*)    GFR, Estimated 7 (*)    Anion gap 18 (*)    All other components within normal limits  CBC WITH DIFFERENTIAL/PLATELET - Abnormal; Notable for the following components:   WBC 17.3 (*)    RBC 1.78 (*)    Hemoglobin 5.6 (*)    HCT 16.7 (*)    Platelets 594 (*)    Neutro Abs 15.5 (*)    Abs Immature Granulocytes 0.10 (*)    All other components within normal limits  PROTIME-INR - Abnormal; Notable for the following components:   Prothrombin Time 16.5 (*)    INR 1.3 (*)    All other components within normal limits  APTT - Abnormal; Notable for the following components:   aPTT 21 (*)    All other components within normal limits  BLOOD  GAS, VENOUS - Abnormal; Notable for the following components:   pCO2, Ven 40 (*)    pO2, Ven <31 (*)    Acid-base deficit 3.5 (*)    All other components within normal limits  CULTURE, BLOOD (ROUTINE X 2)  CULTURE, BLOOD (ROUTINE X 2)  URINE CULTURE  LACTIC ACID, PLASMA  LACTIC ACID, PLASMA  URINALYSIS, ROUTINE W REFLEX MICROSCOPIC  SEDIMENTATION RATE  C-REACTIVE PROTEIN  TYPE AND SCREEN  PREPARE RBC (CROSSMATCH)      EKG   EKG Interpretation  Date/Time:  Wednesday September 06 2021 09:27:25 EDT Ventricular Rate:  128 PR Interval:  102 QRS Duration: 91 QT Interval:  327 QTC Calculation: 478 R Axis:  46 Text Interpretation: Sinus tachycardia Consider right atrial enlargement Confirmed by Panzy Bubeck ([id number]) on 09/06/2021 9:31:03 AM         Imaging Studies ordered: I ordered imaging studies including x-ray foot I independently visualized and interpreted imaging. I agree with the radiologist interpretation   Medicines ordered and prescription drug management: Meds ordered this encounter  Medications   ceFEPIme (MAXIPIME) 2 g in sodium chloride 0.9 % 100 mL IVPB   clindamycin (CLEOCIN) capsule 300 mg   0.9 %  sodium chloride infusion (Manually program via Guardrails IV Fluids)   lactated ringers bolus 1,000 mL   vancomycin (VANCOCIN) IVPB 1000 mg/200 mL premix    Order Specific Question:   Indication:    Answer:   Osteomyelitis    -I have reviewed the patients home medicines and have made adjustments as needed  Critical interventions Fluid resuscitation, packed red blood cells, multiple antibiotics, multiple consultations  Consultations Obtained: I requested consultation with the podiatrist and vascular surgeon on-call,  and discussed lab and imaging findings as well as pertinent plan - they recommend: Transfer to [facility], ER   Cardiac Monitoring: The patient was maintained on a cardiac monitor.  I personally viewed and interpreted the cardiac monitored  which showed an underlying rhythm of: Sinus tachycardia  Social Determinants of Health:  Factors impacting patients care include: Demented, hard of hearing   Reevaluation: After the interventions noted above, I reevaluated the patient and found that they have :improved  Co morbidities that complicate the patient evaluation  Past Medical History:  Diagnosis Date   Anemia    Arthritis    Carotid stenosis    Chronic back pain    Chronic HFrEF (heart failure with reduced ejection fraction) ([place])    a. 09/2020 Echo: EF 45%, mild LVH; b. 12/2020 Echo: EF 25-30%, glob HK, mod LVH, mildly reduced RV fxn, RVSP 65.54mHg, mod BAE. Triv effusion. Mod MR/TR.   CKD (chronic kidney disease), stage III ([place]    Constipation    Dementia ([place]    Diabetes mellitus    Dilated cardiomyopathy ([place]    a. 09/2020 Echo: EF 45%; b. 12/2020 Echo: EF 25-30%.   Foot drop, right    GERD (gastroesophageal reflux disease)    History of kidney stones    Hypertension    Lung nodule    PAF (paroxysmal atrial fibrillation) (HCC)    Peripheral neuropathy    Peripheral vascular disease ([facility]    a. 06/2020 s/p L AKA; b. 10/2020 s/p R SFA and above-knee popliteal PTA.   PTSD (post-traumatic stress disorder)    Schizophrenia ([place]    Tuberculosis    Treated      Dispostion: I considered admission for this patient, and due to high clinical concern for necrotizing fasciitis and need for emergent transfer, [facility]     Final Clinical Impression(s) / ED Diagnoses Final diagnoses:  None     '@PCDICTATION'$ @    KTeressa Lower MD 09/06/21 1109

## 2021-09-06 NOTE — ED Notes (Signed)
RN spoke with blood bank and EDP, new blood orders needed due to the transfer. We do not need to transfuse the 2nd and 3rd transfusion on the first order

## 2021-09-07 ENCOUNTER — Encounter: Payer: TRICARE For Life (TFL) | Admitting: Podiatry

## 2021-09-07 LAB — BPAM RBC
Blood Product Expiration Date: 202309172359
Blood Product Expiration Date: 202309172359
ISSUE DATE / TIME: 202308161539
ISSUE DATE / TIME: 202308161929
Unit Type and Rh: 5100
Unit Type and Rh: 5100

## 2021-09-07 LAB — GLUCOSE, CAPILLARY
Glucose-Capillary: 192 mg/dL — ABNORMAL HIGH (ref 70–99)
Glucose-Capillary: 142 mg/dL — ABNORMAL HIGH (ref 70–99)

## 2021-09-07 LAB — MAGNESIUM: Magnesium: 2.1 mg/dL (ref 1.7–2.4)

## 2021-09-07 LAB — CBC
HCT: 33.7 % — ABNORMAL LOW (ref 39.0–52.0)
Hemoglobin: 11.8 g/dL — ABNORMAL LOW (ref 13.0–17.0)
MCH: 31.7 pg (ref 26.0–34.0)
MCHC: 35 g/dL (ref 30.0–36.0)
MCV: 90.6 fL (ref 80.0–100.0)
Platelets: 439 10*3/uL — ABNORMAL HIGH (ref 150–400)
RBC: 3.72 MIL/uL — ABNORMAL LOW (ref 4.22–5.81)
RDW: 14.6 % (ref 11.5–15.5)
WBC: 15.6 10*3/uL — ABNORMAL HIGH (ref 4.0–10.5)
nRBC: 0 % (ref 0.0–0.2)

## 2021-09-07 LAB — TYPE AND SCREEN
ABO/RH(D): O POS
Antibody Screen: NEGATIVE
Unit division: 0
Unit division: 0

## 2021-09-07 LAB — BASIC METABOLIC PANEL
Anion gap: 15 (ref 5–15)
BUN: 136 mg/dL — ABNORMAL HIGH (ref 8–23)
CO2: 20 mmol/L — ABNORMAL LOW (ref 22–32)
Calcium: 8.8 mg/dL — ABNORMAL LOW (ref 8.9–10.3)
Chloride: 99 mmol/L (ref 98–111)
Creatinine, Ser: 5.88 mg/dL — ABNORMAL HIGH (ref 0.61–1.24)
GFR, Estimated: 9 mL/min — ABNORMAL LOW (ref >60)
Glucose, Bld: 151 mg/dL — ABNORMAL HIGH (ref 70–99)
Potassium: 4.1 mmol/L (ref 3.5–5.1)
Sodium: 134 mmol/L — ABNORMAL LOW (ref 135–145)

## 2021-09-07 LAB — CBG MONITORING, ED
Glucose-Capillary: 142 mg/dL — ABNORMAL HIGH (ref 70–99)
Glucose-Capillary: 192 mg/dL — ABNORMAL HIGH (ref 70–99)
Glucose-Capillary: 147 mg/dL — ABNORMAL HIGH (ref 70–99)

## 2021-09-07 LAB — PHOSPHORUS: Phosphorus: 6.4 mg/dL — ABNORMAL HIGH (ref 2.5–4.6)

## 2021-09-07 LAB — VANCOMYCIN, RANDOM: Vancomycin Rm: 13 ug/mL

## 2021-09-07 MED ORDER — SODIUM CHLORIDE 0.9 % IV SOLN: 2.0000 g | Freq: Three times a day (TID) | INTRAVENOUS | Status: DC

## 2021-09-07 MED ORDER — CLINDAMYCIN HCL 300 MG PO CAPS
300.0000 mg | ORAL_CAPSULE | Freq: Four times a day (QID) | ORAL | Status: DC
Start: 2021-09-07 — End: 2021-09-09
  Administered 2021-09-07 – 2021-09-09 (×11): 300 mg via ORAL
  Filled 2021-09-07 (×8): qty 1
  Filled 2021-09-07 (×2): qty 2
  Filled 2021-09-07 (×3): qty 1

## 2021-09-07 MED ORDER — LACTATED RINGERS IV SOLN: INTRAVENOUS | Status: AC

## 2021-09-07 MED ORDER — LORAZEPAM 2 MG/ML IJ SOLN
1.0000 mg | Freq: Once | INTRAMUSCULAR | Status: AC
  Administered 2021-09-07: 1 mg via INTRAVENOUS
  Filled 2021-09-07: qty 1

## 2021-09-07 MED ORDER — SODIUM CHLORIDE 0.9 % IV SOLN
1.0000 g | INTRAVENOUS | Status: DC
Start: 2021-09-07 — End: 2021-09-09
  Administered 2021-09-07 – 2021-09-09 (×3): 1 g via INTRAVENOUS
  Filled 2021-09-07 (×3): qty 10

## 2021-09-07 MED ORDER — VANCOMYCIN HCL 750 MG/150ML IV SOLN
750.0000 mg | Freq: Once | INTRAVENOUS | Status: AC
Start: 2021-09-07 — End: 2021-09-07
  Administered 2021-09-07: 750 mg via INTRAVENOUS
  Filled 2021-09-07: qty 150

## 2021-09-07 NOTE — ED Notes (Signed)
Patient observed sobbing and crying that he wants to go home and die. Patient asked RN to call his wife to come get him. Patient comforted and provided warm blanket.

## 2021-09-07 NOTE — ED Notes (Signed)
RN returned to patient room for medication, patient is currently asleep.

## 2021-09-07 NOTE — ED Notes (Signed)
Pt refused labs '@330am'$ 

## 2021-09-07 NOTE — Progress Notes (Signed)
Pt arrived from ..ED..., A/ox .1.Marland Kitchenpt denies any pain, MD aware,CCMD called. CHG bath given,no further needs at this time

## 2021-09-07 NOTE — Progress Notes (Signed)
Pharmacy Antibiotic Note  Allen Yu is a 76 y.o. male admitted on 09/06/2021.  Pharmacy has been consulted for vancomycin dosing. Pt is afebrile but WBC is elevated at 15.6. Scr remains elevated but is trending down. A random vancomycin level is 13 this morning.   Plan: Vancomycin '750mg'$  IV x 1 - recheck vanc level in the morning F/u renal fxn, C&S, clinical status and levels as needed  Height: '6\' 1"'$  (185.4 cm) Weight: 48 kg (105 lb 13.1 oz) IBW/kg (Calculated) : 79.9  Temp (24hrs), Avg:97 F (36.1 C), Min:95.2 F (35.1 C), Max:98.1 F (36.7 C)  Recent Labs  Lab 09/06/21 0929 09/06/21 1440 09/07/21 0500  WBC 17.3* 16.5* 15.6*  CREATININE 7.74* 7.23* 5.88*  LATICACIDVEN 1.2  --   --   VANCORANDOM  --   --  13     Estimated Creatinine Clearance: 7.4 mL/min (A) (by C-G formula based on SCr of 5.88 mg/dL (H)).     Antimicrobials this admission: Vanco 8/16 >> Cefepime 8/16>> Clindamycin 8/16>>   Microbiology results: 8/16 BCx: NGTD 8/16 UCx: pending   Salome Arnt, PharmD, BCPS, BCEMP Clinical Pharmacist Please see AMION for all pharmacy numbers 09/07/2021 7:53 AM

## 2021-09-07 NOTE — Progress Notes (Addendum)
  Progress Note    09/07/2021 9:31 AM * No surgery found *  Subjective:  no complaints this morning.  He is aware he will require an amputation of the right leg   Vitals:   09/07/21 0709 09/07/21 0834  BP: (!) 99/59 105/74  Pulse: (!) 122 (!) 125  Resp: 12 20  Temp:  (!) 97.5 F (36.4 C)  SpO2: 96% 95%   Physical Exam: Lungs:  non labored Extremities:  gangrenous TMA RLE Neurologic: A&O  CBC    Component Value Date/Time   WBC 15.6 (H) 09/07/2021 0500   RBC 3.72 (L) 09/07/2021 0500   HGB 11.8 (L) 09/07/2021 0500   HCT 33.7 (L) 09/07/2021 0500   HCT 31.3 (L) 07/09/2015 0757   PLT 439 (H) 09/07/2021 0500   MCV 90.6 09/07/2021 0500   MCH 31.7 09/07/2021 0500   MCHC 35.0 09/07/2021 0500   RDW 14.6 09/07/2021 0500   LYMPHSABS 0.8 09/06/2021 1440   MONOABS 1.0 09/06/2021 1440   EOSABS 0.0 09/06/2021 1440   BASOSABS 0.0 09/06/2021 1440    BMET    Component Value Date/Time   NA 134 (L) 09/07/2021 0500   K 4.1 09/07/2021 0500   CL 99 09/07/2021 0500   CO2 20 (L) 09/07/2021 0500   GLUCOSE 151 (H) 09/07/2021 0500   BUN 136 (H) 09/07/2021 0500   CREATININE 5.88 (H) 09/07/2021 0500   CREATININE 1.10 11/26/2019 0934   CALCIUM 8.8 (L) 09/07/2021 0500   GFRNONAA 9 (L) 09/07/2021 0500   GFRAA >60 07/10/2019 0702    INR    Component Value Date/Time   INR 1.3 (H) 09/06/2021 0929     Intake/Output Summary (Last 24 hours) at 09/07/2021 0931 Last data filed at 09/07/2021 0548 Gross per 24 hour  Intake 1672.5 ml  Output 1650 ml  Net 22.5 ml     Assessment/Plan:  76 y.o. 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 I agree with the above.  He does not yet appear ready for OR. Will tentatively plan  for R AKA early next week.  [name]. Stanford Breed, MD [facility] Phone Number: [phone] 09/07/2021 12:59 PM

## 2021-09-07 NOTE — Progress Notes (Signed)
Progress Note   Patient: Allen Yu [id number]:[id number] DOB: 12/20/1945 DOA: 09/06/2021     1 DOS: the patient was seen and examined on 09/07/2021   Brief hospital course: Mr. Lebarron is a 75 year old gentleman with a history of dementia, peripheral vascular disease with previous transmetatarsal amputation, urinary retention requiring chronic Foley catheter, diabetes, ischemic cardiomyopathy, ongoing tobacco abuse, paroxysmal atrial fibrillation who presented with several days of failure to thrive.  On further evaluation he was found to have sepsis secondary to dry gangrene and also complicated by acute kidney injury and metabolic encephalopathy.  Assessment and Plan: Sepsis due to dry leg gangrene-complicated by AKI and acute mental encephalopathy.  No lactic acidosis associated with sepsis. -Blood cultures drawn on 09/06/2021 no growth in first 24 hours. - Additional volume resuscitation, continue IV fluids close monitoring for fluid overload - Empiric antibiotics of cefepime and clindamycin will be continued - Appreciate vascular surgery's management.  Appreciate recommendations planning for below-knee amputation once patient's overall condition is stabilized.    Acute kidney injury, likely prerenal Hyperphosphatemia Hyponatremia, hypovolemic Chronic urinary retention with chronic Foley catheter - Changed Foley catheter creatinine improved from 7.23 to 5.8 - Volume resuscitation, patient received IV fluid boluses, currently we will continue LR at the rate of 75 mils an hour - Renal ultrasound deferred any acute finding - Strict I's/O - Continue to monitor - Renally dose meds and avoid nephrotoxic medications   Hypomagnesemia - Replete, repeat magnesium 2.1   Baseline dementia, failure to thrive - Ongoing goals of care discussions with his wife, who is very attentive to his needs and takes diligent care of him. -Continue Aricept, mirtazapine, Risperdal - Patient has some agitation  today give one-time dose of Ativan   Diabetes, hyperglycemia - Semglee 8 units daily - Sliding scale insulin as needed - Goal blood glucose 140-180 - Blood sugars currently fairly well controlled.   Acute on chronic anemia-no obvious sources of bleeding - Status posttransfusion of 3 units PRBCs  - Hemoglobin improved from 7.3-11.8 -Monitor for source of bleeding - Continue PPI      Subjective: Seen and examined at bedside today.  Patient had his bed sheets covered over the head upon removing the bedsheet he was able to respond patient is extremely hard of hearing.  He does state that he is having some discomfort in the right lower extremity.  Physical Exam: Vitals:   09/07/21 1145 09/07/21 1200 09/07/21 1300 09/07/21 1400  BP: 120/82 113/65 (!) 128/90 96/79  Pulse: (!) 59  (!) 127 (!) 115  Resp: '15 16 13 13  '$ Temp:      TempSrc:      SpO2: 98%  100% (!) 63%  Weight:      Height:       General: Acute-on-chronically ill-appearing elderly man in NAD. Appears uncomfortable. Significant cachexia/muscle wasting. HEENT: [place]/AT, anicteric sclera, +cataracts, PERRL, dry mucous membranes. +HOH Neuro:  Awake, baseline dementia but appears oriented to self/situation (with wife's prompting).  Responds to verbal stimuli. Following commands intermittently. Moves all 4 extremities spontaneously.  CV: Tachycardic, S2 positive regular rhythm, no m/g/r. PULM: Breathing even and unlabored on RA. Lung fields CTAB anteriorly. GI: Soft, nontender, nondistended. Normoactive bowel sounds. Extremities: No LE edema noted. L AKA with well-healed surgical stump. R TMA with staples in place, mild adjacent swelling without induration, erythema or drainage. R anterior shin with ulceration +skin substitute stapled in place, c/f bony exposure. Skin: Warm/dry, no rashes. Ulceration of R anterior shin as above. Data  Reviewed:      Disposition: Status is: Inpatient Remains inpatient appropriate because:  Septic with infected right foot and severe acute kidney injury.  Planned Discharge Destination:  To be determined    Time spent: 35 minutes  Author: Carlyle Lipa, MD 09/07/2021 3:03 PM  For on call review [email or web address].

## 2021-09-07 NOTE — Progress Notes (Signed)
   09/07/21 1700  Vitals  Temp (!) 97.5 F (36.4 C)  Temp Source Oral  BP (!) 117/97  MAP (mmHg) 105  BP Location Left Arm  BP Method Automatic  Patient Position (if appropriate) Lying  Pulse Rate (!) 125  Pulse Rate Source Monitor  ECG Heart Rate (!) 124  Resp 17  Level of Consciousness  Level of Consciousness Alert  MEWS COLOR  MEWS Score Color Yellow  Oxygen Therapy  SpO2 100 %  O2 Device Room Air  MEWS Score  MEWS Temp 0  MEWS Systolic 0  MEWS Pulse 2  MEWS RR 0  MEWS LOC 0  MEWS Score 2

## 2021-09-07 NOTE — ED Notes (Signed)
Pt extremely agitated at this time. Pt was attempting to swing on this RN and refused to let lab draw his 5 am labs. Pt stated "he was hungry and he had not ate in 2 days." Pt stated "If yall are not going to give me food then to hell with you and I am doing a damn thing you want." This RN told the pt she would talk to admitting to see if we could get him something to eat. Will continue to monitor.

## 2021-09-08 ENCOUNTER — Encounter: Payer: TRICARE For Life (TFL) | Admitting: Vascular Surgery

## 2021-09-08 ENCOUNTER — Encounter (HOSPITAL_COMMUNITY): Payer: TRICARE For Life (TFL)

## 2021-09-08 DIAGNOSIS — A419 Sepsis, unspecified organism: Secondary | ICD-10-CM | POA: Diagnosis not present

## 2021-09-08 DIAGNOSIS — K921 Melena: Secondary | ICD-10-CM

## 2021-09-08 DIAGNOSIS — F03918 Unspecified dementia, unspecified severity, with other behavioral disturbance: Secondary | ICD-10-CM | POA: Diagnosis not present

## 2021-09-08 DIAGNOSIS — L8990 Pressure ulcer of unspecified site, unspecified stage: Secondary | ICD-10-CM | POA: Insufficient documentation

## 2021-09-08 DIAGNOSIS — D62 Acute posthemorrhagic anemia: Secondary | ICD-10-CM | POA: Diagnosis not present

## 2021-09-08 DIAGNOSIS — N179 Acute kidney failure, unspecified: Secondary | ICD-10-CM | POA: Diagnosis not present

## 2021-09-08 LAB — CBC WITH DIFFERENTIAL/PLATELET
Abs Immature Granulocytes: 0.07 10*3/uL (ref 0.00–0.07)
Basophils Absolute: 0 10*3/uL (ref 0.0–0.1)
Basophils Relative: 0 %
Eosinophils Absolute: 0.1 10*3/uL (ref 0.0–0.5)
Eosinophils Relative: 1 %
HCT: 29.3 % — ABNORMAL LOW (ref 39.0–52.0)
Hemoglobin: 10.2 g/dL — ABNORMAL LOW (ref 13.0–17.0)
Immature Granulocytes: 1 %
Lymphocytes Relative: 7 %
Lymphs Abs: 1.1 10*3/uL (ref 0.7–4.0)
MCH: 31.9 pg (ref 26.0–34.0)
MCHC: 34.8 g/dL (ref 30.0–36.0)
MCV: 91.6 fL (ref 80.0–100.0)
Monocytes Absolute: 1.1 10*3/uL — ABNORMAL HIGH (ref 0.1–1.0)
Monocytes Relative: 7 %
Neutro Abs: 13.1 10*3/uL — ABNORMAL HIGH (ref 1.7–7.7)
Neutrophils Relative %: 84 %
Platelets: 434 10*3/uL — ABNORMAL HIGH (ref 150–400)
RBC: 3.2 MIL/uL — ABNORMAL LOW (ref 4.22–5.81)
RDW: 16.2 % — ABNORMAL HIGH (ref 11.5–15.5)
WBC: 15.4 10*3/uL — ABNORMAL HIGH (ref 4.0–10.5)
nRBC: 0 % (ref 0.0–0.2)

## 2021-09-08 LAB — CBC
HCT: 30.7 % — ABNORMAL LOW (ref 39.0–52.0)
Hemoglobin: 10.9 g/dL — ABNORMAL LOW (ref 13.0–17.0)
MCH: 32 pg (ref 26.0–34.0)
MCHC: 35.5 g/dL (ref 30.0–36.0)
MCV: 90 fL (ref 80.0–100.0)
Platelets: 403 10*3/uL — ABNORMAL HIGH (ref 150–400)
RBC: 3.41 MIL/uL — ABNORMAL LOW (ref 4.22–5.81)
RDW: 14.6 % (ref 11.5–15.5)
WBC: 13.9 10*3/uL — ABNORMAL HIGH (ref 4.0–10.5)
nRBC: 0 % (ref 0.0–0.2)

## 2021-09-08 LAB — COMPREHENSIVE METABOLIC PANEL
ALT: 13 U/L (ref 0–44)
AST: 22 U/L (ref 15–41)
Albumin: 2.4 g/dL — ABNORMAL LOW (ref 3.5–5.0)
Alkaline Phosphatase: 87 U/L (ref 38–126)
Anion gap: 9 (ref 5–15)
BUN: 104 mg/dL — ABNORMAL HIGH (ref 8–23)
CO2: 22 mmol/L (ref 22–32)
Calcium: 8.7 mg/dL — ABNORMAL LOW (ref 8.9–10.3)
Chloride: 105 mmol/L (ref 98–111)
Creatinine, Ser: 3.94 mg/dL — ABNORMAL HIGH (ref 0.61–1.24)
GFR, Estimated: 15 mL/min — ABNORMAL LOW (ref >60)
Glucose, Bld: 98 mg/dL (ref 70–99)
Potassium: 3.6 mmol/L (ref 3.5–5.1)
Sodium: 136 mmol/L (ref 135–145)
Total Bilirubin: 0.8 mg/dL (ref 0.3–1.2)
Total Protein: 6.4 g/dL — ABNORMAL LOW (ref 6.5–8.1)

## 2021-09-08 LAB — GLUCOSE, CAPILLARY
Glucose-Capillary: 116 mg/dL — ABNORMAL HIGH (ref 70–99)
Glucose-Capillary: 103 mg/dL — ABNORMAL HIGH (ref 70–99)
Glucose-Capillary: 195 mg/dL — ABNORMAL HIGH (ref 70–99)
Glucose-Capillary: 140 mg/dL — ABNORMAL HIGH (ref 70–99)

## 2021-09-08 LAB — VANCOMYCIN, RANDOM: Vancomycin Rm: 17 ug/mL

## 2021-09-08 LAB — HEMOGLOBIN AND HEMATOCRIT, BLOOD
HCT: 32.7 % — ABNORMAL LOW (ref 39.0–52.0)
Hemoglobin: 11.3 g/dL — ABNORMAL LOW (ref 13.0–17.0)

## 2021-09-08 LAB — OCCULT BLOOD X 1 CARD TO LAB, STOOL: Fecal Occult Bld: POSITIVE — AB

## 2021-09-08 MED ORDER — ASCORBIC ACID 500 MG PO TABS
500.0000 mg | ORAL_TABLET | Freq: Two times a day (BID) | ORAL | Status: DC
  Administered 2021-09-08 – 2021-09-15 (×10): 500 mg via ORAL
  Filled 2021-09-08 (×13): qty 1

## 2021-09-08 MED ORDER — ADULT MULTIVITAMIN W/MINERALS CH
1.0000 | ORAL_TABLET | Freq: Every day | ORAL | Status: DC
  Administered 2021-09-08 – 2021-09-15 (×4): 1 via ORAL
  Filled 2021-09-08 (×6): qty 1

## 2021-09-08 MED ORDER — NEPRO/CARBSTEADY PO LIQD: 237.0000 mL | Freq: Three times a day (TID) | ORAL | Status: DC

## 2021-09-08 MED ORDER — PROSOURCE PLUS PO LIQD
30.0000 mL | Freq: Three times a day (TID) | ORAL | Status: DC
  Administered 2021-09-08 – 2021-09-15 (×4): 30 mL via ORAL
  Filled 2021-09-08 (×6): qty 30

## 2021-09-08 MED ORDER — LACTATED RINGERS IV SOLN: INTRAVENOUS | Status: DC

## 2021-09-08 MED ORDER — ENSURE ENLIVE PO LIQD
237.0000 mL | Freq: Three times a day (TID) | ORAL | Status: DC
Start: 2021-09-08 — End: 2021-09-15
  Administered 2021-09-08 – 2021-09-13 (×5): 237 mL via ORAL

## 2021-09-08 MED ORDER — ZINC SULFATE 220 (50 ZN) MG PO CAPS
220.0000 mg | ORAL_CAPSULE | Freq: Every day | ORAL | Status: DC
  Administered 2021-09-08 – 2021-09-15 (×4): 220 mg via ORAL
  Filled 2021-09-08 (×6): qty 1

## 2021-09-08 MED ORDER — VANCOMYCIN HCL 500 MG/100ML IV SOLN
500.0000 mg | Freq: Once | INTRAVENOUS | Status: AC
  Administered 2021-09-08: 500 mg via INTRAVENOUS
  Filled 2021-09-08: qty 100

## 2021-09-08 MED ORDER — METOPROLOL SUCCINATE ER 50 MG PO TB24
50.0000 mg | ORAL_TABLET | Freq: Every day | ORAL | Status: DC
Start: 2021-09-08 — End: 2021-09-09
  Administered 2021-09-08 – 2021-09-09 (×2): 50 mg via ORAL
  Filled 2021-09-08 (×3): qty 1

## 2021-09-08 MED ORDER — RENA-VITE PO TABS: 1.0000 | ORAL_TABLET | Freq: Every day | ORAL | Status: DC

## 2021-09-08 MED ORDER — METOPROLOL TARTRATE 5 MG/5ML IV SOLN: 2.5000 mg | Freq: Three times a day (TID) | INTRAVENOUS | Status: DC | PRN

## 2021-09-08 MED ORDER — VANCOMYCIN HCL 500 MG/100ML IV SOLN
500.0000 mg | Freq: Once | INTRAVENOUS | Status: DC
  Filled 2021-09-08: qty 100

## 2021-09-08 NOTE — Progress Notes (Signed)
Pt is increasing level of agitation and trying to get out of bed. We initially requested tele-sitter due to high fall risk. Pt was reoriented with safety measured. He is able to follow simple commands.   Pt had large dark brawn and red bowel movement x 1. Fecal hemoccult was positive result. We will hand off to a morning team. Follow CBC result Hb 11.8 on 09/07/21.   Pt is able to have a good oral intake. LR infusion at 75 ml/hr. Sinus tachycardia on the monitor. HR 120-130, BP 127/92, afebrile, no respiratory distress. We will continue to monitor.  Kennyth Lose, RN

## 2021-09-08 NOTE — Progress Notes (Signed)
Pt agitated this morning. Pt verbally and physically aggressive with staff. Pt attempting to punch and kick staff and removing all monitors. Mittens applied to bilateral hands. MD notified. Orders received for bilateral wrist restraints and soft belt restraint.

## 2021-09-08 NOTE — Progress Notes (Signed)
Per phlebotomist report, pt refused am lab draws. Phlebotomist will re-attempt at a later time.

## 2021-09-08 NOTE — Progress Notes (Signed)
Pharmacy Antibiotic Note  Allen Yu is a 77 y.o. male admitted on 09/06/2021.  Pharmacy has been consulted for vancomycin dosing.   WBC 13, Scr improving to 3.94 (CrCl 10.9 mL/min). Vanc random this morning came back at 17. Uop still low at -500 mL/24 hr. Urine cx growing pseudomonas and enterococcus - sensitivities pending.    Plan: Vancomycin 500 mg IV x 1 - recheck vanc level in the morning to see how clearing Cefepime 1 g IV every 24 hours Clindamycin 300 mg q6 F/u renal fxn, C&S, clinical status and levels as needed  Height: '6\' 1"'$  (185.4 cm) Weight: 47.7 kg (105 lb 2.6 oz) IBW/kg (Calculated) : 79.9  Temp (24hrs), Avg:97 F (36.1 C), Min:94 F (34.4 C), Max:98.1 F (36.7 C)  Recent Labs  Lab 09/06/21 0929 09/06/21 1440 09/07/21 0500 09/08/21 0608 09/08/21 1323  WBC 17.3* 16.5* 15.6* 15.4* 13.9*  CREATININE 7.74* 7.23* 5.88*  --  3.94*  LATICACIDVEN 1.2  --   --   --   --   VANCORANDOM  --   --  13 17  --      Estimated Creatinine Clearance: 10.9 mL/min (A) (by C-G formula based on SCr of 3.94 mg/dL (H)).     Antimicrobials this admission: Vanco 8/16 >> Cefepime 8/16>> Clindamycin 8/16>>   Microbiology results: 8/16 BCx: NGTD 8/16 UCx: >100k pseudomonas/enterococcus faecalis   Antonietta Jewel, PharmD, BCCCP Clinical Pharmacist  Phone: [phone] 09/08/2021 3:45 PM  Please check AMION for all [place] phone numbers After 10:00 PM, call [place] [phone]

## 2021-09-08 NOTE — Consult Note (Signed)
[place] Nurse Consult Note: Reason for Consult: penile wound Discussed with bedside nurse. Not an open wound. Chronic from use of indwelling FC Wound type: urethral erosion Pressure Injury POA:NA Measurement:NA Wound bed: no open wound Drainage (amount, consistency, odor) none Periwound:intact  Dressing procedure/placement/frequency: Urethral erosion from [facility]; unless patient is experiencing urinary complications no topical care needed. Follow up with urologist if [facility] erosion progresses and/or need for management of urinary related symptoms from the erosion  To prevent further erosion, FC should be secured well and not allowed to have tension; secure with leg strap or other securing device at all times. If patient will allow.    Re consult if needed, will not follow at this time. Thanks  Joycelynn Fritsche [employer], RN,CWOCN, CNS, [place] [phone])

## 2021-09-08 NOTE — Progress Notes (Signed)
Pt presented with open wound on posterior penis with urethra exposure prior this admission from chronically use foley catheter. Wound care team was consulted.   16 Fr.Foley cath was changed with no difficulties by Mindy, RRT. Pt was well tolerated.   Pt has been disoriented x 4, not compliant, yelling " take me home". He was trying to hit staff when provided nursing care.   Risperdal was given PRN for agitation.   Sinus tachycardia on the monitor, HR 120-130. BP 124/98. Pt refused staff to check his temp. RR 18-24. SPO2 96% on 2 LPM. Continue to monitor.  Kennyth Lose RN

## 2021-09-08 NOTE — Care Management Important Message (Signed)
Important Message  Patient Details  Name: Allen Yu MRN: [id number] Date of Birth: 07-23-1945   Medicare Important Message Given:  Yes Due to illness patient was not able to sign signed copy left at the bedside.     Alexie Lanni 09/08/2021, 3:00 PM

## 2021-09-08 NOTE — Progress Notes (Addendum)
[facility] [place]  Subjective  - slightly confused and hard of hearing.  He states he wants to go home.   Objective 104/65 91 (!) 97.4 F (36.3 C) (Oral) 19 96%  Intake/Output Summary (Last 24 hours) at 09/08/2021 0853 Last data filed at 09/08/2021 0617 Gross per 24 hour  Intake 1276.57 ml  Output 501 ml  Net 775.57 ml    Right TMA with dark dry gangrene skin changes as well as anterior shin wound  Lungs non labored breathing     Assessment/Planning: Non healing right TMA/anterior shin wound s/p  balloon angioplasty of the peroneal and popliteal arteries followed by SFA popliteal stenting from the mid SFA into the P2 segment of the popliteal artery.  Popliteal stent 5 x 80 mm Bio-mimick, SFA stents 6 x 150 mm x 2.  The patient then under went TMA by Dr. Adair Laundry.    He is admitted with sepsis complicated by acute kidney injury and metabolic encephalopathy. Plan for above knee amputation amputation next week.   Roxy Horseman 09/08/2021 8:53 AM --  Laboratory Lab Results: Recent Labs    09/07/21 0500 09/08/21 0608  WBC 15.6* 15.4*  HGB 11.8* 10.2*  HCT 33.7* 29.3*  PLT 439* 434*   BMET Recent Labs    09/06/21 1440 09/07/21 0500  NA 132* 134*  K 4.4 4.1  CL 97* 99  CO2 19* 20*  GLUCOSE 230* 151*  BUN 148* 136*  CREATININE 7.23* 5.88*  CALCIUM 8.3* 8.8*    COAG Lab Results  Component Value Date   INR 1.3 (H) 09/06/2021   INR 1.0 07/21/2021   INR 1.2 07/20/2021   No results found for: "PTT"  VASCULAR STAFF ADDENDUM: I have independently interviewed and examined the patient. I agree with the above.   [name]. Stanford Breed, MD [facility] Phone Number: [phone] 09/08/2021 7:55 PM

## 2021-09-08 NOTE — Consult Note (Signed)
Consultation  Referring Provider: [facility]/ [place] Primary Care Physician:  [name], MD Primary Gastroenterologist:  Prior Dr Gala Romney / Linna Hoff  Reason for Consultation:   GI bleeding  HPI: Allen Yu is a 76 y.o. male, with multiple comorbidities including significant dementia, extremely hard of hearing, atrial fibrillation, congestive heart failure with EF of 35 to 40%, schizophrenia, PTSD, diabetes mellitus, chronic kidney disease stage III, and severe peripheral vascular disease. He is status post remote left BKA, has had multiple stents to the right lower extremity, and was on Plavix and aspirin prior to admission.  He had undergone a very recent mutation of his right great toe on 08/17/2021. He had presented to the emergency room at [facility] on 09/06/2021 with weakness and failure to thrive.  He had been seen by his PCP I believe that same day and had outpatient labs that had shown a hemoglobin of 6.5, leukocytosis and a creatinine of 8/BUN 103.  Patient's wife was concerned as he had not been eating well at home and had not made much urine over the previous days. He met sepsis criteria, and was transferred to [facility], for critical care and vascular surgery consultation.  He is felt to have dry gangrene of the right lower extremity in addition to acute kidney injury. He is being covered with cefepime and clindamycin, he has been seen by vascular surgery and plan is for a transtibial amputation at some point next week.  He was transfused 3 units of packed RBCs after noting hemoglobin 5.6 on admission and Plavix has been held  He was noted in the very early morning hours today to have a dark and bloody bowel movement per nursing, sent for Hemoccult and positive. He did have 1 small bowel movement this afternoon and per his nurse this did not look bloody at all, was perhaps a bit dark..  History of prior GI bleeding that I can ascertain from records.  He did have colonoscopy per Dr. Buford Dresser  in [place] in 2016 for screening was noted to have a redundant colon and scattered left diverticulosis, no polyps.  No prior EGD in epic or Care Everywhere.  Per nursing patient had been quite agitated this morning, had refused labs, was attempting to hit the staff.  He is home this afternoon, has been hemodynamically stable, in A-fib currently with heart rate of 130.  Labs 09/07/2021 hemoglobin 11.8/hematocrit 33.7/WBC 15.6 Today 0600  hemoglobin 10.2/hematocrit 29.3-pending this p.m.  Review of prior CBC shows hemoglobin as low as 5.8 and 6.8 earlier this summer in June. Iron studies at that time with serum iron 104/TIBC 231/iron sat 45 Had seen nephrology during that admission and anemia felt multifactorial, was transfused.  Patient is currently unable to offer any history.    Past Medical History:  Diagnosis Date   Anemia    Arthritis    Carotid stenosis    Chronic back pain    Chronic HFrEF (heart failure with reduced ejection fraction) ([place])    a. 09/2020 Echo: EF 45%, mild LVH; b. 12/2020 Echo: EF 25-30%, glob HK, mod LVH, mildly reduced RV fxn, RVSP 65.42mHg, mod BAE. Triv effusion. Mod MR/TR.   CKD (chronic kidney disease), stage III ([place]    Constipation    Dementia ([place]    Diabetes mellitus    Dilated cardiomyopathy ([place]    a. 09/2020 Echo: EF 45%; b. 12/2020 Echo: EF 25-30%.   Foot drop, right    GERD (gastroesophageal reflux disease)  History of kidney stones    Hypertension    Lung nodule    PAF (paroxysmal atrial fibrillation) (HCC)    Peripheral neuropathy    Peripheral vascular disease ([place])    a. 06/2020 s/p L AKA; b. 10/2020 s/p R SFA and above-knee popliteal PTA.   PTSD (post-traumatic stress disorder)    Schizophrenia ([place])    Tuberculosis    Treated    Past Surgical History:  Procedure Laterality Date   ABDOMINAL AORTOGRAM W/LOWER EXTREMITY N/A 05/20/2020   Procedure: ABDOMINAL AORTOGRAM W/LOWER EXTREMITY;  Surgeon: Elam Dutch, MD;   Location: [place] [facility];  Service: Cardiovascular;  Laterality: N/A;   ABDOMINAL AORTOGRAM W/LOWER EXTREMITY N/A 10/27/2020   Procedure: ABDOMINAL AORTOGRAM W/LOWER EXTREMITY;  Surgeon: Marty Heck, MD;  Location: [place] [facility];  Service: Cardiovascular;  Laterality: N/A;   ABDOMINAL AORTOGRAM W/LOWER EXTREMITY Right 07/26/2021   Procedure: ABDOMINAL AORTOGRAM W/LOWER EXTREMITY;  Surgeon: Broadus Abimelec, MD;  Location: [place] [facility];  Service: Cardiovascular;  Laterality: Right;   AMPUTATION Left 06/28/2020   Procedure: AMPUTATION ABOVE KNEE LEFT;  Surgeon: Rosetta Posner, MD;  Location: [place];  Service: Vascular;  Laterality: Left;   [place]  2000, 2013   x2   BLADDER SURGERY     02   CATARACT EXTRACTION W/PHACO Right 05/12/2021   Procedure: CATARACT EXTRACTION PHACO AND INTRAOCULAR LENS PLACEMENT ([place]);  Surgeon: Baruch Goldmann, MD;  Location: [facility];  Service: Ophthalmology;  Laterality: Right;  CDE: 27.28   CATARACT EXTRACTION W/PHACO Left 05/29/2021   Procedure: CATARACT EXTRACTION PHACO AND INTRAOCULAR LENS PLACEMENT (IOC);  Surgeon: Baruch Goldmann, MD;  Location: [facility];  Service: Ophthalmology;  Laterality: Left;  CDE  12.96   CHOLECYSTECTOMY     COLONOSCOPY     COLONOSCOPY N/A 12/07/2014   Procedure: COLONOSCOPY;  Surgeon: Daneil Dolin, MD;  [facility];  Service: Endoscopy;  Laterality: N/A;  10:30 Am   ENDOVASCULAR REPAIR/STENT GRAFT Right 07/26/2021   Procedure: ENDOVASCULAR REPAIR/STENT GRAFT;  Surgeon: Broadus Ivo, MD;  Location: [place] [facility];  Service: Cardiovascular;  Laterality: Right;  SFA   EYE SURGERY Bilateral    removed metal from eye   FEMORAL-TIBIAL BYPASS GRAFT Left 05/27/2020   Procedure: LEFT FEMORAL TO PERONEAL ARTERY BYPASS;  Surgeon: Rosetta Posner, MD;  Location: [place];  Service: Vascular;  Laterality: Left;   HERNIA REPAIR Right    INGUINAL HERNIA REPAIR Left 11/03/2013   Procedure: HERNIA REPAIR  INGUINAL ADULT;  Surgeon: Gayland Curry, MD;  Location: [place];  Service: General;  Laterality: Left;   IRRIGATION AND DEBRIDEMENT FOOT Right 08/17/2021   Procedure: IRRIGATION AND DEBRIDEMENT ULCER RIGHT LEG APPLICATION SKIN SUBSTITUTE;  Surgeon: Criselda Peaches, DPM;  Location: [facility];  Service: Podiatry;  Laterality: Right;   PERIPHERAL VASCULAR BALLOON ANGIOPLASTY Right 07/26/2021   Procedure: PERIPHERAL VASCULAR BALLOON ANGIOPLASTY;  Surgeon: Broadus Hardie, MD;  Location: [place] [facility];  Service: Cardiovascular;  Laterality: Right;  PERONEAL   PERIPHERAL VASCULAR INTERVENTION  10/27/2020   Procedure: PERIPHERAL VASCULAR INTERVENTION;  Surgeon: Marty Heck, MD;  Location: [place] [facility];  Service: Cardiovascular;;   SHOULDER SURGERY     RIGHT SHOULDER    TRANSMETATARSAL AMPUTATION Right 08/17/2021   Procedure: TRANSMETATARSAL AMPUTATION;  Surgeon: Criselda Peaches, DPM;  Location: [facility];  Service: Podiatry;  Laterality: Right;   VIDEO BRONCHOSCOPY WITH ENDOBRONCHIAL  NAVIGATION N/A 07/10/2019   Procedure: VIDEO BRONCHOSCOPY WITH ENDOBRONCHIAL NAVIGATION;  Surgeon: Melrose Nakayama, MD;  Location: [facility];  Service: Thoracic;  Laterality: N/A;    Prior to Admission medications   Medication Sig Start Date End Date Taking? Authorizing Provider  acetaminophen (TYLENOL) 500 MG tablet Take 500 mg by mouth 2 (two) times daily as needed for moderate pain. 03/07/21  Yes [provider]  aspirin EC 81 MG tablet Take 1 tablet (81 mg total) by mouth daily with breakfast. 07/07/21 07/07/22 Yes Emokpae, Courage, MD  atorvastatin (LIPITOR) 40 MG tablet Take 1 tablet (40 mg total) by mouth at bedtime. 07/14/21 10/12/21 Yes Strader, Fransisco Hertz, PA-C  clopidogrel (PLAVIX) 75 MG tablet Take 1 tablet (75 mg total) by mouth daily. 07/07/21 07/07/22 Yes Emokpae, Courage, MD  donepezil (ARICEPT) 10 MG tablet Take 10 mg by mouth at bedtime.   Yes [provider]  doxycycline  (VIBRA-TABS) 100 MG tablet Take 1 tablet (100 mg total) by mouth 2 (two) times daily. 08/31/21  Yes McDonald, Stephan Minister, DPM  ferrous sulfate 325 (65 FE) MG tablet Take 325 mg by mouth daily with breakfast. 03/07/21  Yes [provider]  gabapentin (NEURONTIN) 100 MG capsule Take 1 capsule (100 mg total) by mouth 2 (two) times daily. 07/07/21  Yes Emokpae, Courage, MD  LANTUS 100 UNIT/ML injection Inject 0.08 mLs (8 Units total) into the skin at bedtime. 07/07/21  Yes Emokpae, Courage, MD  losartan (COZAAR) 50 MG tablet Take 1 tablet (50 mg total) by mouth daily. 07/07/21 07/02/22 Yes Emokpae, Courage, MD  metoprolol succinate (TOPROL-XL) 50 MG 24 hr tablet Take 1 tablet (50 mg total) by mouth daily. Take with or immediately following a meal. 06/26/21 06/21/22 Yes Strader, [place] M, PA-C  mirtazapine (REMERON) 15 MG tablet TAKE ONE-HALF TABLET BY MOUTH AT BEDTIME FOR APPETITE 02/11/21 09/15/21 Yes [provider]  Multiple Vitamins-Minerals (MULTIVITAMIN WITH MINERALS) tablet Take 1 tablet by mouth daily. 01/19/21 01/19/22 Yes Emokpae, Courage, MD  omeprazole (PRILOSEC) 20 MG capsule Take 1 capsule by mouth daily. 03/23/21  Yes [provider]  ondansetron (ZOFRAN) 4 MG tablet Take 1 tablet (4 mg total) by mouth every 6 (six) hours as needed for nausea. 01/19/21  Yes Emokpae, Courage, MD  oxyCODONE-acetaminophen (PERCOCET) 5-325 MG tablet Take 1 tablet by mouth every 4 (four) hours as needed for severe pain. 08/17/21 08/17/22 Yes McDonald, Stephan Minister, DPM  risperiDONE (RISPERDAL) 0.5 MG tablet Take 1 tablet (0.5 mg total) by mouth every 12 (twelve) hours as needed (Restlessness and agitation). 01/19/21  Yes Emokpae, Courage, MD  senna-docusate (SENEXON-S) 8.6-50 MG tablet TAKE 2 TABLETS BY MOUTH DAILY AS NEEDED FOR CONSTIPATION 08/29/21 08/30/22 Yes [provider]  torsemide 40 MG TABS Take 40 mg by mouth every morning. 07/07/21  Yes Emokpae, Courage, MD  traMADol (ULTRAM) 50 MG tablet Take  50 mg by mouth every 6 (six) hours as needed for moderate pain. 07/18/20  Yes [provider]  Alcohol Swabs (ALCOHOL PADS) 70 % PADS USE 1 PAD AS DIRECTED 10/12/20 10/13/21  [provider]  B-D UF III MINI PEN NEEDLES 31G X 5 MM MISC SMARTSIG:1 Each SUB-Q Daily 12/23/19   [provider]  feeding supplement, ENSURE COMPLETE, (ENSURE COMPLETE) LIQD Take 237 mLs by mouth 2 (two) times daily between meals. 01/19/21   Roxan Hockey, MD    Current Facility-Administered Medications  Medication Dose Route Frequency Provider Last Rate Last Admin   0.9 %  sodium chloride infusion  10 mL/hr Intravenous Once Valarie Merino, MD   Held at 09/06/21 1744   acetaminophen (TYLENOL) tablet 650 mg  650 mg Oral Q8H PRN [place] Crane M, PA-C       atorvastatin (LIPITOR) tablet 40 mg  40 mg Oral QHS [place] Crane M, PA-C   40 mg at 09/07/21 2246   ceFEPIme (MAXIPIME) 1 g in sodium chloride 0.9 % 100 mL IVPB  1 g Intravenous Q24H Carlyle Lipa, MD 200 mL/hr at 09/08/21 1106 1 g at 09/08/21 1106   clindamycin (CLEOCIN) capsule 300 mg  300 mg Oral Q6H Carlyle Lipa, MD   300 mg at 09/08/21 3007   docusate sodium (COLACE) capsule 100 mg  100 mg Oral BID PRN Lestine Mount, PA-C       donepezil (ARICEPT) tablet 10 mg  10 mg Oral QHS [place] Crane M, PA-C   10 mg at 09/07/21 2246   ferrous sulfate tablet 325 mg  325 mg Oral Q breakfast Lestine Mount, PA-C   325 mg at 09/07/21 6226   insulin aspart (novoLOG) injection 0-9 Units  0-9 Units Subcutaneous Q4H Lestine Mount, [place]   1 Units at 09/07/21 2235   insulin glargine-yfgn [facility]) injection 8 Units  8 Units Subcutaneous QHS Lestine Mount, PA-C   8 Units at 09/07/21 2247   lactated ringers infusion   Intravenous Continuous Carlyle Lipa, MD 75 mL/hr at 09/08/21 0617 New Bag at 09/08/21 0617   mirtazapine (REMERON) tablet 7.5 mg  7.5 mg Oral QHS [place] Crane M, PA-C   7.5 mg at 09/07/21 2246    pantoprazole (PROTONIX) injection 40 mg  40 mg Intravenous Q12H [place] Crane M, PA-C   40 mg at 09/08/21 1103   polyethylene glycol (MIRALAX / GLYCOLAX) packet 17 g  17 g Oral Daily PRN [place] Crane M, PA-C       risperiDONE (RISPERDAL) tablet 0.5 mg  0.5 mg Oral Q12H PRN [place] Crane M, PA-C   0.5 mg at 09/08/21 0007   vancomycin variable dose per unstable renal function (pharmacist dosing)   Does not apply See admin instructions Kommor, Debe Coder, MD        Allergies as of 09/06/2021 - Review Complete 09/06/2021  Allergen Reaction Noted   Bee venom Anaphylaxis 08/06/2011   Codeine Other (See Comments) 11/21/2010   Propoxyphene Other (See Comments) 09/10/2011   Valsartan Other (See Comments) 11/21/2010    Family History  Problem Relation Age of Onset   Heart disease Mother        before age 40    Social History   Socioeconomic History   Marital status: Married    Spouse name: [name]   Number of children: Not on file   Years of education: Not on file   Highest education level: Not on file  Occupational History   Not on file  Tobacco Use   Smoking status: Every Day    Packs/day: 0.50    Years: 45.00    Total pack years: 22.50    Types: Cigarettes   Smokeless tobacco: Never   Tobacco comments:    burns them up  Vaping Use   Vaping Use: Never used  Substance and Sexual Activity   Alcohol use: No    Alcohol/week: 0.0 standard drinks of alcohol   Drug use: Yes    Types: Marijuana    Comment: almost daily for pain   Sexual activity: Never  Other Topics Concern  Not on file  Social History Narrative   Service connected veteran    Cared for at home by [name]   In home with twin (boy, girl) grandchildren of [place] Determinants of Health   Financial Resource Strain: Low Risk  (08/16/2021)   Overall Financial Resource Strain (CARDIA)    Difficulty of Paying Living Expenses: Not hard at all  Food Insecurity: No Food Insecurity (09/05/2021)    Hunger Vital Sign    Worried About Running Out of Food in the Last Year: Never true    [place] in the Last Year: Never true  Transportation Needs: No Transportation Needs (09/05/2021)   PRAPARE - Hydrologist (Medical): No    Lack of Transportation (Non-Medical): No  Physical Activity: Not on file  Stress: No Stress Concern Present (08/16/2021)   [place]    Feeling of Stress : Only a little  Social Connections: Socially Integrated (08/28/2021)   Social Connection and Isolation Panel [NHANES]    Frequency of Communication with Friends and Family: Three times a week    Frequency of Social Gatherings with Friends and Family: Three times a week    Attends Religious Services: 1 to 4 times per year    Active Member of Clubs or Organizations: Yes    Attends Archivist Meetings: 1 to 4 times per year    Marital Status: Married  Human resources officer Violence: Not At Risk (08/28/2021)   Humiliation, Afraid, Rape, and Kick questionnaire    Fear of Current or Ex-Partner: No    Emotionally Abused: No    Physically Abused: No    Sexually Abused: No    Review of Systems: Pertinent positive and negative review of systems were noted in the above HPI section.  All other review of systems was otherwise negative.   Physical Exam: Vital signs in last 24 hours: Temp:  [94 F (34.4 C)-98.1 F (36.7 C)] 97.6 F (36.4 C) (08/18 1159) Pulse Rate:  [91-129] 91 (08/18 0800) Resp:  [13-24] 20 (08/18 1159) BP: (92-134)/(53-98) 117/68 (08/18 1159) SpO2:  [63 %-100 %] 98 % (08/18 1159) Weight:  [47.7 kg] 47.7 kg (08/18 0117) Last BM Date : 09/08/21 General:   Alert,  Well-developed,, thin very frail appearing elderly African-American male wrapped in blankets-extremely hard of hearing and did not answer questions, curled in fetal position Head:  Normocephalic and atraumatic. Eyes:  Sclera clear, no  icterus.   Conjunctiva pink. Ears:  Normal auditory acuity. Nose:  No deformity, discharge,  or lesions. Mouth:  No deformity or lesions.   Neck:  Supple; no masses or thyromegaly. Lungs:  Clear throughout to auscultation.   No wheezes, crackles, or rhonchi. Heart:  irRegular rate and rhythm; no murmurs, clicks, rubs,  or gallops. Abdomen:  Soft,nontender, BS active,nonpalp mass or hsm.   Rectal: Not done, stool documented heme positive early this a.m. Msk:  Symmetrical without gross deformities. .  Extremities: Status post left BKA, right lower extremity muscular atrophy, nonhealing wounds on the shin and foot Neurologic:  Alert  Skin: Nonhealing wounds right lower extremity Psych:  Alert and semicooperative  Intake/Output from previous day: 08/17 0701 - 08/18 0700 In: 1276.6 [P.O.:200; I.V.:979.8; IV Piggyback:96.8] Out: 501 [Urine:500; Stool:1] Intake/Output this shift: Total I/O In: -  Out: 450 [Urine:450]  Lab Results: Recent Labs    09/06/21 1440 09/07/21 0500 09/08/21 0608  WBC 16.5* 15.6* 15.4*  HGB 7.3* 11.8* 10.2*  HCT 21.7* 33.7* 29.3*  PLT 447* 439* 434*   BMET Recent Labs    09/06/21 0929 09/06/21 1440 09/07/21 0500  NA 134* 132* 134*  K 4.9 4.4 4.1  CL 96* 97* 99  CO2 20* 19* 20*  GLUCOSE 175* 230* 151*  BUN 134* 148* 136*  CREATININE 7.74* 7.23* 5.88*  CALCIUM 9.0 8.3* 8.8*   LFT Recent Labs    09/06/21 0929  PROT 8.1  ALBUMIN 3.1*  AST 23  ALT 13  ALKPHOS 104  BILITOT 0.7   PT/INR Recent Labs    09/06/21 0929  LABPROT 16.5*  INR 1.3*   Hepatitis Panel No results for input(s): "HEPBSAG", "HCVAB", "HEPAIGM", "HEPBIGM" in the last 72 hours.   IMPRESSION:  #32 76 year old African-American male with history of chronic anemia not iron deficient, admitted 2 days ago with sepsis and noted at that time to have hemoglobin 5.6. Transfused 3 units with hemoglobin up to 11.8  Had 1 dark bloody bowel movement earlier today, second bowel  movement this afternoon nonbloody  Etiology of bleeding is not clear, suspect lower GI source-he does have previously documented diverticulosis-consider diverticular bleeding, acid or other lower GI etiologies, AVMs, doubt upper GI source but cannot absolutely rule out.  #2 sepsis secondary to dry gangrene right lower extremity-resolving Plan is for transtibial amputation next week  #3 severe peripheral vascular disease, status post stents to the right lower extremity, very recent amputation of the right great toe On aspirin and Plavix prior to admission  #4 dementia #5 hard of hearing #6 congestive heart failure with EF 35 to 40% #7 adult onset diabetes mellitus #8.  Chronic kidney disease stage III with AKI on admit # 9 schizophrenia  Plan; Plavix has been on hold since admit-day 3 Monitor serial hemoglobins every 6 to 8 hours, transfuse as indicated Cover with IV PPI once daily We will plan to observe for now, no endoscopic evaluation unless he manifests more active GI bleeding over the next 24 to 48 hours.     PA-C 09/08/2021, 1:23 PM

## 2021-09-08 NOTE — Progress Notes (Signed)
Progress Note   Patient: Allen Yu:[id number] DOB: [birth year] DOA: 09/06/2021     2 DOS: the patient was seen and examined on 09/08/2021   Brief hospital course: Mr. Whitworth is a 76 year old gentleman with a history of dementia, peripheral vascular disease with previous transmetatarsal amputation, urinary retention requiring chronic Foley catheter, diabetes, ischemic cardiomyopathy, ongoing tobacco abuse, paroxysmal atrial fibrillation who presented with several days of failure to thrive.  On further evaluation he was found to have sepsis secondary to dry gangrene and also complicated by acute kidney injury and metabolic encephalopathy.  Subjective Seen and examined at bedside.  Patient continues to have sheets over his entire body including his head, very hard of hearing.  Somewhat agitated, noted to be swinging at staff members.  Continues to report that he wants to go home.  Refused to answer any of my questions.   Assessment and Plan:  Sepsis due to dry leg gangrene PVD Currently afebrile, with leukocytosis, tachycardic BC x2, NGTD Procalcitonin 0.41 Appreciate vascular surgery's management, plan for transtibial amputation next week once stabilized Continue vancomycin, cefepime, clindamycin Hold home Plavix, due to possible GI bleed  Acute metabolic encephalopathy Patient with underlying history of dementia, states to be agitated, hitting and cursing at staff Delirium precautions Soft restraints implemented as needed  Acute kidney injury on CKD stage IIIb  Hyperphosphatemia Creatinine baseline around 1.7-2 Creatinine improving s/p IV fluids  Continue IV fluids Hold home torsemide, losartan Renally dose meds and avoid nephrotoxic medications Daily BMP  ?UTI Chronic urinary retention with chronic Foley catheter Urine culture growing Pseudomonas aeruginosa, E faecalis (as noted in the past), possible colonization Recently changed Foley Antibiotics as above Monitor  closely  Acute on chronic anemia of CKD Possible GIB- noted to have large bloody stool this a.m on 09/08/2021 FOBT positive Status posttransfusion of 3 units PRBCs, transfuse if hemoglobin drifts down to 7 GI consulted, appreciate recs, will follow and monitor for possible scope if significant drop in hemoglobin with bleeding Continue PPI Frequent CBC checks  Paroxysmal A-fib Heart rate uncontrolled EKG on 8/16 showing sinus tachycardia, will repeat Restart home Toprol Not on any anticoagulation Telemetry  Chronic systolic and diastolic HF Appears somewhat dry, recent poor oral intake, decreased urine output Last echo done 6/23 showed EF of 35 to 15%, grade 1 diastolic dysfunction Monitor closely as receiving gentle hydration Hold home torsemide for now Strict I's and O's, daily weights  Diabetes mellitus type 2 Last A1c 6 on 6/23 SSI, Semglee, Accu-Cheks hypoglycemic protocol   Baseline dementia, failure to thrive Malnutrition Continue Aricept, mirtazapine, Risperdal Delirium precautions Nutrition consulted, appreciate recs  Goals of care discussion Ongoing goals of care discussion has been initiated Patient with multiple comorbidities, failure to thrive, dementia, overall poor prognosis Palliative consult             Physical Exam: Vitals:   09/08/21 0623 09/08/21 0800 09/08/21 1159 09/08/21 1453  BP: 105/67 104/65 117/68 130/79  Pulse: (!) 128 91    Resp: '18 19 20 16  '$ Temp: (!) 94.4 F (34.7 C) (!) 97.4 F (36.3 C) 97.6 F (36.4 C) 97.6 F (36.4 C)  TempSrc: Axillary Oral Oral Oral  SpO2: 97% 96% 98% 100%  Weight:      Height:        General: Chronically ill-appearing, very deconditioned, cachectic, HOH, awake, alert, oriented to self Cardiovascular: S1, S2 present Respiratory: CTAB Abdomen: Soft, nontender, nondistended, bowel sounds present Musculoskeletal: No bilateral lower extremity edema noted, left  AKA with well-healed surgical stump,  right TMA with staples in place, right anterior shin with ulceration Skin: As noted above Psychiatry: Agitated   Disposition: Status is: Inpatient Remains inpatient appropriate because: Septic with infected right foot and severe acute kidney injury.  Planned Discharge Destination:  To be determined      Author: Alma Friendly, MD 09/08/2021 3:21 PM  For on call review [email or web address].

## 2021-09-08 NOTE — Progress Notes (Signed)
Initial Nutrition Assessment  DOCUMENTATION CODES:   Underweight  INTERVENTION:   -Liberalize diet to 2 gram sodium for wider variety of meal selections -MVI with minerals daily -500 mg vitamin C BID -220 mg zinc sulfate daily x 14 days -Ensure Enlive po TID, each supplement provides 350 kcal and 20 grams of protein.  -30 ml Prosource Plus TID, each supplement provides 100 kcals and 15 grams  NUTRITION DIAGNOSIS:   Increased nutrient needs related to wound healing as evidenced by estimated needs.  GOAL:   Patient will meet greater than or equal to 90% of their needs  MONITOR:   PO intake, Supplement acceptance  REASON FOR ASSESSMENT:   Consult Assessment of nutrition requirement/status, Calorie Count, Poor PO, Wound healing  ASSESSMENT:   Pt with a history of dementia, peripheral vascular disease with previous transmetatarsal amputation, urinary retention requiring chronic Foley catheter, diabetes, ischemic cardiomyopathy, ongoing tobacco abuse, paroxysmal atrial fibrillation who presented with several days of failure to thrive  Pt admitted with sepsis secondary to dry leg gangrene, AKI, and encephalopathy.   Reviewed I/O's: +776 ml x 24 hours and +798 since admission  UOP: 500 ml x 24 hours  Pt unavailable at time of visit. Attempted to speak with pt via call to hospital room phone, however, unable to reach. RD unable to obtain further nutrition-related history or complete nutrition-focused physical exam at this time.     Per vascular surgery notes, pt with gangrenous rt TMA; plan for rt AKA once medically optimized.   Pt currently on a heart healthy/ carb modified diet. Meal completions 100%. Per H&P, pt eating and drinking minimally for 3-4 days PTA. He is very HOH.   Reviewed wt hx; pt has experienced a 10% wt loss over the past 3 weeks, which is significant for time frame.   Given pt's underweight status and multiple co-morbidities, highly suspect pt with  malnutrition, however, RD unable to identify pt at this time. Pt with increased nutritional needs due to wound healing and would benefit from addition of oral nutrition supplements.   Medications reviewed and include remeron.   Lab Results  Component Value Date   HGBA1C 6.0 (H) 07/04/2021   PTA DM medications are 8 units insulin glargine daily.   Labs reviewed: CBGS: 103 (inpatient orders for glycemic control are 0-9 units insulin aspart every 4 hours and 8 units insulin glargine-yfgn daily).    Diet Order:   Diet Order             Diet 2 gram sodium Room service appropriate? Yes; Fluid consistency: Thin  Diet effective now                   EDUCATION NEEDS:   No education needs have been identified at this time  Skin:  Skin Assessment: Skin Integrity Issues: Skin Integrity Issues:: Stage I, Incisions, Other (Comment) Stage I: sacrum Incisions: closed rt foot Other: dehisced wound to penis  Last BM:  09/08/21 (type 6)  Height:   Ht Readings from Last 1 Encounters:  09/06/21 '6\' 1"'$  (1.854 m)    Weight:   Wt Readings from Last 1 Encounters:  09/08/21 47.7 kg    Ideal Body Weight:  78.2 kg (adjusted for lt BKA)  BMI:  Body mass index is 13.87 kg/m.  Estimated Nutritional Needs:   Kcal:  1900-2100  Protein:  125-150 grams  Fluid:  1000 ml + UOP    Loistine Chance, RD, LDN, [place] Registered Dietitian II Certified Diabetes  Care and Education Specialist Please refer to [facility] for RD and/or RD on-call/weekend/after hours pager

## 2021-09-08 NOTE — Progress Notes (Signed)
Pt refusing to allow staff to check his blood sugar. Pt continues to cuss and yell at staff, stating that he wants to go home.

## 2021-09-09 DIAGNOSIS — R933 Abnormal findings on diagnostic imaging of other parts of digestive tract: Secondary | ICD-10-CM

## 2021-09-09 DIAGNOSIS — F03918 Unspecified dementia, unspecified severity, with other behavioral disturbance: Secondary | ICD-10-CM | POA: Diagnosis not present

## 2021-09-09 DIAGNOSIS — K921 Melena: Secondary | ICD-10-CM

## 2021-09-09 DIAGNOSIS — I471 Supraventricular tachycardia: Secondary | ICD-10-CM

## 2021-09-09 DIAGNOSIS — D649 Anemia, unspecified: Secondary | ICD-10-CM | POA: Diagnosis not present

## 2021-09-09 DIAGNOSIS — D62 Acute posthemorrhagic anemia: Secondary | ICD-10-CM | POA: Diagnosis not present

## 2021-09-09 DIAGNOSIS — I10 Essential (primary) hypertension: Secondary | ICD-10-CM

## 2021-09-09 DIAGNOSIS — A419 Sepsis, unspecified organism: Secondary | ICD-10-CM | POA: Diagnosis not present

## 2021-09-09 DIAGNOSIS — I4819 Other persistent atrial fibrillation: Secondary | ICD-10-CM

## 2021-09-09 DIAGNOSIS — N179 Acute kidney failure, unspecified: Secondary | ICD-10-CM | POA: Diagnosis not present

## 2021-09-09 LAB — HEMOGLOBIN AND HEMATOCRIT, BLOOD
HCT: 28.1 % — ABNORMAL LOW (ref 39.0–52.0)
Hemoglobin: 9.5 g/dL — ABNORMAL LOW (ref 13.0–17.0)

## 2021-09-09 LAB — CBC WITH DIFFERENTIAL/PLATELET
Abs Immature Granulocytes: 0.11 10*3/uL — ABNORMAL HIGH (ref 0.00–0.07)
Basophils Absolute: 0 10*3/uL (ref 0.0–0.1)
Basophils Relative: 0 %
Eosinophils Absolute: 0 10*3/uL (ref 0.0–0.5)
Eosinophils Relative: 0 %
HCT: 29 % — ABNORMAL LOW (ref 39.0–52.0)
Hemoglobin: 9.9 g/dL — ABNORMAL LOW (ref 13.0–17.0)
Immature Granulocytes: 1 %
Lymphocytes Relative: 6 %
Lymphs Abs: 0.9 10*3/uL (ref 0.7–4.0)
MCH: 31.1 pg (ref 26.0–34.0)
MCHC: 34.1 g/dL (ref 30.0–36.0)
MCV: 91.2 fL (ref 80.0–100.0)
Monocytes Absolute: 1 10*3/uL (ref 0.1–1.0)
Monocytes Relative: 6 %
Neutro Abs: 14.1 10*3/uL — ABNORMAL HIGH (ref 1.7–7.7)
Neutrophils Relative %: 87 %
Platelets: 393 10*3/uL (ref 150–400)
RBC: 3.18 MIL/uL — ABNORMAL LOW (ref 4.22–5.81)
RDW: 14.4 % (ref 11.5–15.5)
WBC: 16.3 10*3/uL — ABNORMAL HIGH (ref 4.0–10.5)
nRBC: 0 % (ref 0.0–0.2)

## 2021-09-09 LAB — GLUCOSE, CAPILLARY
Glucose-Capillary: 86 mg/dL (ref 70–99)
Glucose-Capillary: 66 mg/dL — ABNORMAL LOW (ref 70–99)
Glucose-Capillary: 149 mg/dL — ABNORMAL HIGH (ref 70–99)
Glucose-Capillary: 58 mg/dL — ABNORMAL LOW (ref 70–99)
Glucose-Capillary: 122 mg/dL — ABNORMAL HIGH (ref 70–99)
Glucose-Capillary: 66 mg/dL — ABNORMAL LOW (ref 70–99)
Glucose-Capillary: 80 mg/dL (ref 70–99)
Glucose-Capillary: 106 mg/dL — ABNORMAL HIGH (ref 70–99)

## 2021-09-09 LAB — VANCOMYCIN, RANDOM
Vancomycin Rm: 21 ug/mL
Vancomycin Rm: 18 ug/mL

## 2021-09-09 LAB — URINE CULTURE: Culture: >=100000 — AB

## 2021-09-09 LAB — BASIC METABOLIC PANEL
Anion gap: 10 (ref 5–15)
BUN: 89 mg/dL — ABNORMAL HIGH (ref 8–23)
CO2: 24 mmol/L (ref 22–32)
Calcium: 9.2 mg/dL (ref 8.9–10.3)
Chloride: 106 mmol/L (ref 98–111)
Creatinine, Ser: 3.23 mg/dL — ABNORMAL HIGH (ref 0.61–1.24)
GFR, Estimated: 19 mL/min — ABNORMAL LOW (ref >60)
Glucose, Bld: 55 mg/dL — ABNORMAL LOW (ref 70–99)
Potassium: 4.1 mmol/L (ref 3.5–5.1)
Sodium: 140 mmol/L (ref 135–145)

## 2021-09-09 MED ORDER — AMIODARONE LOAD VIA INFUSION
150.0000 mg | Freq: Once | INTRAVENOUS | Status: DC
  Filled 2021-09-09: qty 83.34

## 2021-09-09 MED ORDER — LACTATED RINGERS IV SOLN
INTRAVENOUS | Status: DC
Start: 2021-09-09 — End: 2021-09-09

## 2021-09-09 MED ORDER — VANCOMYCIN HCL 500 MG/100ML IV SOLN
500.0000 mg | Freq: Once | INTRAVENOUS | Status: AC
  Administered 2021-09-09: 500 mg via INTRAVENOUS
  Filled 2021-09-09: qty 100

## 2021-09-09 MED ORDER — LACTATED RINGERS IV SOLN
INTRAVENOUS | Status: DC
Start: 2021-09-09 — End: 2021-09-10

## 2021-09-09 MED ORDER — METRONIDAZOLE 500 MG/100ML IV SOLN
500.0000 mg | Freq: Two times a day (BID) | INTRAVENOUS | Status: DC
  Administered 2021-09-09 – 2021-09-12 (×6): 500 mg via INTRAVENOUS
  Filled 2021-09-09 (×7): qty 100

## 2021-09-09 MED ORDER — DEXTROSE 50 % IV SOLN
INTRAVENOUS | Status: AC
  Administered 2021-09-09: 50 mL
  Filled 2021-09-09: qty 50

## 2021-09-09 MED ORDER — SODIUM CHLORIDE 0.9 % IV SOLN
1.0000 g | Freq: Once | INTRAVENOUS | Status: DC
Start: 2021-09-09 — End: 2021-09-09
  Filled 2021-09-09: qty 10

## 2021-09-09 MED ORDER — CEFEPIME HCL 2 G IV SOLR
2.0000 g | INTRAVENOUS | Status: DC
Start: 2021-09-10 — End: 2021-09-09

## 2021-09-09 MED ORDER — CEFEPIME HCL 1 G IJ SOLR
1.0000 g | INTRAMUSCULAR | Status: DC
  Administered 2021-09-10 – 2021-09-12 (×3): 1 g via INTRAVENOUS
  Filled 2021-09-09 (×3): qty 10

## 2021-09-09 MED ORDER — AMIODARONE HCL IN DEXTROSE 360-4.14 MG/200ML-% IV SOLN: 30.0000 mg/h | INTRAVENOUS | Status: DC

## 2021-09-09 MED ORDER — AMIODARONE HCL IN DEXTROSE 360-4.14 MG/200ML-% IV SOLN
60.0000 mg/h | INTRAVENOUS | Status: DC
  Filled 2021-09-09: qty 200

## 2021-09-09 NOTE — Progress Notes (Signed)
Hypoglycemic Event  CBG: 58  Treatment: D50 50 mL (25 gm)  Symptoms:  "quite, not talking "  Follow-up CBG: Time:0712 CBG Result:149  Possible Reasons for Event: Unknown  Comments/MD notified:will notify MD.   Lajean Saver

## 2021-09-09 NOTE — Consult Note (Signed)
Cardiology Consultation:   Patient ID: Allen Yu MRN: [id number]; DOB: 07-Dec-1945  Admit date: 09/06/2021 Date of Consult: 09/09/2021  PCP:  [name], MD   [facility] Providers Cardiologist:  Rozann Lesches, MD        Patient Profile:   Allen Yu is a 76 y.o. male with a hx of HFrEF (EF = 35-40%), persistent AF (not on [facility]), PAD s/p L AKA (6/22) and R TMA, HTN, HLD, DM2, CKD 3, GERD, dementia, schizophrenia who is being seen 09/09/2021 for the evaluation of tachycardia at the request of Dr. Horris Latino.  History of Present Illness:   Allen Yu was admitted on 8/16 to hospital medicine for sepsis secondary to RLE gangrene.  Since admission he has been on broad-spectrum antibiotics and was evaluated by vascular surgery who is planning for an R AKA once the patient is medically optimized.  His hospital course has been complicated by GIB requiring multiple blood transfusions and persistent tachycardia to the 120s.  The patient is hard of hearing and has dementia and was unable to participate in my questioning.  Per the patient's family that was in the room, he has not been complaining of any chest pain, shortness of breath, or GI symptoms as of today.  His vital signs are temp 36.4 C, HR 128, BP 94/54, RR 20, satting 98% on RA.  Over the course the day the patient has become more hypotensive but his heart rates have remained consistent.  Given his tachycardia and new or hypotension, cardiology is consulted for evaluation.   Past Medical History:  Diagnosis Date   Anemia    Arthritis    Carotid stenosis    Chronic back pain    Chronic HFrEF (heart failure with reduced ejection fraction) ([place])    a. 09/2020 Echo: EF 45%, mild LVH; b. 12/2020 Echo: EF 25-30%, glob HK, mod LVH, mildly reduced RV fxn, RVSP 65.34mHg, mod BAE. Triv effusion. Mod MR/TR.   CKD (chronic kidney disease), stage III ([facility]    Constipation    Dementia ([facility]    Diabetes mellitus    Dilated  cardiomyopathy ([facility]    a. 09/2020 Echo: EF 45%; b. 12/2020 Echo: EF 25-30%.   Foot drop, right    GERD (gastroesophageal reflux disease)    History of kidney stones    Hypertension    Lung nodule    PAF (paroxysmal atrial fibrillation) (HCC)    Peripheral neuropathy    Peripheral vascular disease ([facility]    a. 06/2020 s/p L AKA; b. 10/2020 s/p R SFA and above-knee popliteal PTA.   PTSD (post-traumatic stress disorder)    Schizophrenia ([facility]    Tuberculosis    Treated    Past Surgical History:  Procedure Laterality Date   ABDOMINAL AORTOGRAM W/LOWER EXTREMITY N/A 05/20/2020   Procedure: ABDOMINAL AORTOGRAM W/LOWER EXTREMITY;  Surgeon: FElam Dutch MD;  [facility];  Service: Cardiovascular;  Laterality: N/A;   ABDOMINAL AORTOGRAM W/LOWER EXTREMITY N/A 10/27/2020   Procedure: ABDOMINAL AORTOGRAM W/LOWER EXTREMITY;  Surgeon: CMarty Heck MD;  [facility];  Service: Cardiovascular;  Laterality: N/A;   ABDOMINAL AORTOGRAM W/LOWER EXTREMITY Right 07/26/2021   Procedure: ABDOMINAL AORTOGRAM W/LOWER EXTREMITY;  Surgeon: RBroadus Aaditya MD;  [facility];  Service: Cardiovascular;  Laterality: Right;   AMPUTATION Left 06/28/2020   Procedure: AMPUTATION ABOVE KNEE LEFT;  Surgeon: ERosetta Posner MD;  [facility]  Service: Vascular;  Laterality: Left;  APPENDECTOMY     BACK SURGERY  2000, 2013   x2   BLADDER SURGERY     02   CATARACT EXTRACTION W/PHACO Right 05/12/2021   Procedure: CATARACT EXTRACTION PHACO AND INTRAOCULAR LENS PLACEMENT (IOC);  Surgeon: Baruch Goldmann, MD;  Location: [facility];  Service: Ophthalmology;  Laterality: Right;  CDE: 27.28   CATARACT EXTRACTION W/PHACO Left 05/29/2021   Procedure: CATARACT EXTRACTION PHACO AND INTRAOCULAR LENS PLACEMENT (IOC);  Surgeon: Baruch Goldmann, MD;  Location: [facility];  Service: Ophthalmology;  Laterality: Left;  CDE  12.96   CHOLECYSTECTOMY     COLONOSCOPY     COLONOSCOPY N/A  12/07/2014   Procedure: COLONOSCOPY;  Surgeon: Daneil Dolin, MD;  [facility]: [facility];  Service: Endoscopy;  Laterality: N/A;  10:30 Am   ENDOVASCULAR REPAIR/STENT GRAFT Right 07/26/2021   Procedure: ENDOVASCULAR REPAIR/STENT GRAFT;  Surgeon: Broadus Donye, MD;  Location: [place] [facility];  Service: Cardiovascular;  Laterality: Right;  SFA   EYE SURGERY Bilateral    removed metal from eye   FEMORAL-TIBIAL BYPASS GRAFT Left 05/27/2020   Procedure: LEFT FEMORAL TO PERONEAL ARTERY BYPASS;  Surgeon: Rosetta Posner, MD;  Location: [place];  Service: Vascular;  Laterality: Left;   HERNIA REPAIR Right    INGUINAL HERNIA REPAIR Left 11/03/2013   Procedure: HERNIA REPAIR INGUINAL ADULT;  Surgeon: Gayland Curry, MD;  Location: [place];  Service: General;  Laterality: Left;   IRRIGATION AND DEBRIDEMENT FOOT Right 08/17/2021   Procedure: IRRIGATION AND DEBRIDEMENT ULCER RIGHT LEG APPLICATION SKIN SUBSTITUTE;  Surgeon: Criselda Peaches, DPM;  Location: [facility];  Service: Podiatry;  Laterality: Right;   PERIPHERAL VASCULAR BALLOON ANGIOPLASTY Right 07/26/2021   Procedure: PERIPHERAL VASCULAR BALLOON ANGIOPLASTY;  Surgeon: Broadus Jimel, MD;  Location: [place] [facility];  Service: Cardiovascular;  Laterality: Right;  PERONEAL   PERIPHERAL VASCULAR INTERVENTION  10/27/2020   Procedure: PERIPHERAL VASCULAR INTERVENTION;  Surgeon: Marty Heck, MD;  Location: [place] [facility];  Service: Cardiovascular;;   SHOULDER SURGERY     RIGHT SHOULDER    TRANSMETATARSAL AMPUTATION Right 08/17/2021   Procedure: TRANSMETATARSAL AMPUTATION;  Surgeon: Criselda Peaches, DPM;  [facility]: [facility];  Service: Podiatry;  Laterality: Right;   VIDEO BRONCHOSCOPY WITH ENDOBRONCHIAL NAVIGATION N/A 07/10/2019   Procedure: VIDEO BRONCHOSCOPY WITH ENDOBRONCHIAL NAVIGATION;  Surgeon: Melrose Nakayama, MD;  Location: [place];  Service: Thoracic;  Laterality: N/A;     Home Medications:  Prior to Admission medications    Medication Sig Start Date End Date Taking? Authorizing Provider  acetaminophen (TYLENOL) 500 MG tablet Take 500 mg by mouth 2 (two) times daily as needed for moderate pain. 03/07/21  Yes [provider]  aspirin EC 81 MG tablet Take 1 tablet (81 mg total) by mouth daily with breakfast. 07/07/21 07/07/22 Yes Emokpae, Courage, MD  atorvastatin (LIPITOR) 40 MG tablet Take 1 tablet (40 mg total) by mouth at bedtime. 07/14/21 10/12/21 Yes Strader, Fransisco Hertz, PA-C  clopidogrel (PLAVIX) 75 MG tablet Take 1 tablet (75 mg total) by mouth daily. 07/07/21 07/07/22 Yes Emokpae, Courage, MD  donepezil (ARICEPT) 10 MG tablet Take 10 mg by mouth at bedtime.   Yes [provider]  doxycycline (VIBRA-TABS) 100 MG tablet Take 1 tablet (100 mg total) by mouth 2 (two) times daily. 08/31/21  Yes McDonald, Stephan Minister, DPM  ferrous sulfate 325 (65 FE) MG tablet Take 325 mg by mouth daily with breakfast. 03/07/21  Yes [provider]  gabapentin (NEURONTIN) 100 MG capsule Take 1 capsule (100 mg total) by mouth 2 (two) times daily. 07/07/21  Yes Emokpae, Courage, MD  LANTUS 100 UNIT/ML injection Inject 0.08 mLs (8 Units total) into the skin at bedtime. 07/07/21  Yes Emokpae, Courage, MD  losartan (COZAAR) 50 MG tablet Take 1 tablet (50 mg total) by mouth daily. 07/07/21 07/02/22 Yes Emokpae, Courage, MD  metoprolol succinate (TOPROL-XL) 50 MG 24 hr tablet Take 1 tablet (50 mg total) by mouth daily. Take with or immediately following a meal. 06/26/21 06/21/22 Yes Strader, [place] M, PA-C  mirtazapine (REMERON) 15 MG tablet TAKE ONE-HALF TABLET BY MOUTH AT BEDTIME FOR APPETITE 02/11/21 09/15/21 Yes [provider]  Multiple Vitamins-Minerals (MULTIVITAMIN WITH MINERALS) tablet Take 1 tablet by mouth daily. 01/19/21 01/19/22 Yes Emokpae, Courage, MD  omeprazole (PRILOSEC) 20 MG capsule Take 1 capsule by mouth daily. 03/23/21  Yes [provider]  ondansetron (ZOFRAN) 4 MG tablet Take 1 tablet (4 mg  total) by mouth every 6 (six) hours as needed for nausea. 01/19/21  Yes Emokpae, Courage, MD  oxyCODONE-acetaminophen (PERCOCET) 5-325 MG tablet Take 1 tablet by mouth every 4 (four) hours as needed for severe pain. 08/17/21 08/17/22 Yes McDonald, Stephan Minister, DPM  risperiDONE (RISPERDAL) 0.5 MG tablet Take 1 tablet (0.5 mg total) by mouth every 12 (twelve) hours as needed (Restlessness and agitation). 01/19/21  Yes Emokpae, Courage, MD  senna-docusate (SENEXON-S) 8.6-50 MG tablet TAKE 2 TABLETS BY MOUTH DAILY AS NEEDED FOR CONSTIPATION 08/29/21 08/30/22 Yes [provider]  torsemide 40 MG TABS Take 40 mg by mouth every morning. 07/07/21  Yes Emokpae, Courage, MD  traMADol (ULTRAM) 50 MG tablet Take 50 mg by mouth every 6 (six) hours as needed for moderate pain. 07/18/20  Yes [provider]  Alcohol Swabs (ALCOHOL PADS) 70 % PADS USE 1 PAD AS DIRECTED 10/12/20 10/13/21  [provider]  B-D UF III MINI PEN NEEDLES 31G X 5 MM MISC SMARTSIG:1 Each SUB-Q Daily 12/23/19   [provider]  feeding supplement, ENSURE COMPLETE, (ENSURE COMPLETE) LIQD Take 237 mLs by mouth 2 (two) times daily between meals. 01/19/21   Roxan Hockey, MD    Inpatient Medications: Scheduled Meds:  (feeding supplement) PROSource Plus  30 mL Oral TID BM   amiodarone  150 mg Intravenous Once   ascorbic acid  500 mg Oral BID   atorvastatin  40 mg Oral QHS   donepezil  10 mg Oral QHS   feeding supplement  237 mL Oral TID BM   ferrous sulfate  325 mg Oral Q breakfast   insulin aspart  0-9 Units Subcutaneous Q4H   metoprolol succinate  50 mg Oral Daily   mirtazapine  7.5 mg Oral QHS   multivitamin with minerals  1 tablet Oral Daily   pantoprazole (PROTONIX) IV  40 mg Intravenous Q12H   vancomycin variable dose per unstable renal function (pharmacist dosing)   Does not apply See admin instructions   zinc sulfate  220 mg Oral Daily   Continuous Infusions:  sodium chloride Stopped (09/06/21 1744)    amiodarone     Followed by   Derrill Memo ON 09/10/2021] amiodarone     [START ON 09/10/2021] ceFEPime (MAXIPIME) IV     lactated ringers 75 mL/hr at 09/09/21 1443   metronidazole     vancomycin 500 mg (09/09/21 1705)   PRN Meds: acetaminophen, docusate sodium, metoprolol tartrate, polyethylene glycol, risperiDONE  Allergies:    Allergies  Allergen Reactions  Bee Venom Anaphylaxis   Codeine Other (See Comments)    incoherent  Other reaction(s): Delirium Other reaction(s): Delirium   Propoxyphene Other (See Comments)    Dizziness, "Makes me feel drunk" Other reaction(s): Dizziness Other reaction(s): Dizziness   Valsartan Other (See Comments)    incoherent Other reaction(s): Delirium Other reaction(s): Delirium    Social History:   Social History   Socioeconomic History   Marital status: Married    Spouse name: [name]   Number of children: Not on file   Years of education: Not on file   Highest education level: Not on file  Occupational History   Not on file  Tobacco Use   Smoking status: Every Day    Packs/day: 0.50    Years: 45.00    Total pack years: 22.50    Types: Cigarettes   Smokeless tobacco: Never   Tobacco comments:    burns them up  Vaping Use   Vaping Use: Never used  Substance and Sexual Activity   Alcohol use: No    Alcohol/week: 0.0 standard drinks of alcohol   Drug use: Yes    Types: Marijuana    Comment: almost daily for pain   Sexual activity: Never  Other Topics Concern   Not on file  Social History Narrative   Service connected veteran    Cared for at home by [name]   In home with twin (boy, girl) grandchildren of [name]   Social Determinants of Health   Financial Resource Strain: Low Risk  (08/16/2021)   Overall Financial Resource Strain (CARDIA)    Difficulty of Paying Living Expenses: Not hard at all  Food Insecurity: No Food Insecurity (09/05/2021)   Hunger Vital Sign    Worried About Running Out of Food in the Last Year: Never  true    Ran Out of Food in the Last Year: Never true  Transportation Needs: No Transportation Needs (09/05/2021)   PRAPARE - Hydrologist (Medical): No    Lack of Transportation (Non-Medical): No  Physical Activity: Not on file  Stress: No Stress Concern Present (08/16/2021)   [place]    Feeling of Stress : Only a little  Social Connections: Socially Integrated (08/28/2021)   Social Connection and Isolation Panel [NHANES]    Frequency of Communication with Friends and Family: Three times a week    Frequency of Social Gatherings with Friends and Family: Three times a week    Attends Religious Services: 1 to 4 times per year    Active Member of Clubs or Organizations: Yes    Attends Archivist Meetings: 1 to 4 times per year    Marital Status: Married  Human resources officer Violence: Not At Risk (08/28/2021)   Humiliation, Afraid, Rape, and Kick questionnaire    Fear of Current or Ex-Partner: No    Emotionally Abused: No    Physically Abused: No    Sexually Abused: No    Family History:    Family History  Problem Relation Age of Onset   Heart disease Mother        before age 28     ROS:  Please see the history of present illness.  All other ROS reviewed and negative.     Physical Exam/Data:   Vitals:   09/09/21 0759 09/09/21 1107 09/09/21 1641 09/09/21 1735  BP: (!) 120/41 96/65 (!) 101/46 (!) 94/54  Pulse: (!) 120 (!) 125 (!) 129 [name])  125  Resp: '16 20 20   '$ Temp:  (!) 97.5 F (36.4 C) (!) 97.5 F (36.4 C)   TempSrc:  Oral Oral   SpO2: 99% 95% 98%   Weight:      Height:        Intake/Output Summary (Last 24 hours) at 09/09/2021 1748 Last data filed at 09/09/2021 1723 Gross per 24 hour  Intake 1618.63 ml  Output 1425 ml  Net 193.63 ml      09/08/2021    1:17 AM 09/08/2021   12:10 AM 09/06/2021    9:23 AM  Last 3 Weights  Weight (lbs) 105 lb 2.6 oz 105 lb 2.6 oz 105  lb 13.1 oz  Weight (kg) 47.7 kg 47.7 kg 48 kg     Body mass index is 13.87 kg/m.  General: Thin and frail appearing elderly male in NAD HEENT: Dry MM, EOMI Neck: no JVD Cardiac:  normal S1, S2; tachycardic with regular rhythm; no murmur, rubs or gallops Lungs:  clear to auscultation bilaterally, no wheezing, rhonchi or rales  Abd: soft, nontender, no hepatomegaly  Ext: no edema, L AKA, R TMA with gauze overlying distal shin Musculoskeletal: Reduced muscle bulk with normal tone Skin: warm and dry  Neuro: Grossly moves extremities but unable to fully participate in neurologic assessment Psych: Unable to answer orientation questions  EKG:  The EKG was personally reviewed and demonstrates:    Telemetry:  Telemetry was personally reviewed and demonstrates:  Tachycardia likely an atrial tachycardia  Relevant CV Studies:  TTE 07/04/21:  IMPRESSIONS     1. Left ventricular ejection fraction, by estimation, is 35 to 40%. The  left ventricle has moderately decreased function. The left ventricle  demonstrates global hypokinesis. Left ventricular diastolic parameters are  consistent with Grade I diastolic  dysfunction (impaired relaxation).   2. Right ventricular systolic function is normal. The right ventricular  size is normal. There is moderately elevated pulmonary artery systolic  pressure. The estimated right ventricular systolic pressure is 77.8 mmHg.   3. The mitral valve is grossly normal. Trivial mitral valve  regurgitation. No evidence of mitral stenosis.   4. The aortic valve is tricuspid. There is mild calcification of the  aortic valve. Aortic valve regurgitation is not visualized. Aortic valve  sclerosis is present, with no evidence of aortic valve stenosis.   5. The inferior vena cava is dilated in size with >50% respiratory  variability, suggesting right atrial pressure of 8 mmHg.   Laboratory Data:  High Sensitivity Troponin:  No results for input(s): "TROPONINIHS"  in the last 720 hours.   Chemistry Recent Labs  Lab 09/06/21 1440 09/07/21 0500 09/08/21 1323 09/09/21 0555  NA 132* 134* 136 140  K 4.4 4.1 3.6 4.1  CL 97* 99 105 106  CO2 19* 20* 22 24  GLUCOSE 230* 151* 98 55*  BUN 148* 136* 104* 89*  CREATININE 7.23* 5.88* 3.94* 3.23*  CALCIUM 8.3* 8.8* 8.7* 9.2  MG 1.1* 2.1  --   --   GFRNONAA 7* 9* 15* 19*  ANIONGAP 16* '15 9 10    '$ Recent Labs  Lab 09/06/21 0929 09/08/21 1323  PROT 8.1 6.4*  ALBUMIN 3.1* 2.4*  AST 23 22  ALT 13 13  ALKPHOS 104 87  BILITOT 0.7 0.8   Lipids No results for input(s): "CHOL", "TRIG", "HDL", "LABVLDL", "LDLCALC", "CHOLHDL" in the last 168 hours.  Hematology Recent Labs  Lab 09/08/21 0608 09/08/21 1323 09/08/21 1434 09/09/21 0555 09/09/21 1426  WBC 15.4*  13.9*  --  16.3*  --   RBC 3.20* 3.41*  --  3.18*  --   HGB 10.2* 10.9* 11.3* 9.9* 9.5*  HCT 29.3* 30.7* 32.7* 29.0* 28.1*  MCV 91.6 90.0  --  91.2  --   MCH 31.9 32.0  --  31.1  --   MCHC 34.8 35.5  --  34.1  --   RDW 16.2* 14.6  --  14.4  --   PLT 434* 403*  --  393  --    Thyroid No results for input(s): "TSH", "FREET4" in the last 168 hours.  BNP Recent Labs  Lab 09/17/21 1440  BNP 176.9*    DDimer No results for input(s): "DDIMER" in the last 168 hours.   Radiology/Studies:  US RENAL  Result Date: 17-Sep-2021 CLINICAL DATA:  AKI EXAM: RENAL / URINARY TRACT ULTRASOUND COMPLETE COMPARISON:  July 20, 2021 FINDINGS: Right Kidney: Renal measurements: 10.5 x 5.1 x 5.3 cm = volume: 146.7 mL. Echogenicity within normal limits and no mass or hydronephrosis visualized. Left Kidney: Renal measurements: 9.2 x 6.1 x 6.0 cm = volume: 178.6 mL. Echogenicity within normal limits. Redemonstrated simple cyst in the superior pole of the left kidney measuring 1.8 x 1.6 by 1.4 cm, unchanged from prior. No hydronephrosis visualized. Bladder: Decompressed with Foley catheter in place. Other: None. IMPRESSION: No acute findings. Electronically Signed   By:  Beryle Flock M.D.   On: 2021/09/17 17:00   DG Foot Complete Right  Result Date: September 17, 2021 CLINICAL DATA:  Recent amputation RIGHT foot, redness at foot and lower leg, diabetes mellitus, question necrotizing fasciitis or osteomyelitis EXAM: RIGHT FOOT COMPLETE - 3+ VIEW COMPARISON:  None FINDINGS: Prior transmetatarsal amputation with scattered soft tissue swelling. Soft tissue swelling at amputation bed. Multiple foci of soft tissue gas at the amputation bed and at medial margin of foot adjacent to the medial cuneiform, could be related to an open wound or infection by a gas-forming organism. Scattered atherosclerotic calcifications. No fracture, dislocation, or bone destruction. IMPRESSION: Soft tissue swelling RIGHT foot greatest at amputation bed post transmetatarsal amputation. Foci of soft tissue gas at the amputation bed and medial to the medial cuneiform, question related to open wound or infection by gas-forming organism. Findings called to Dr. Matilde Sprang On 09-17-2021 at 1106 hrs. Electronically Signed   By: Lavonia Dana M.D.   On: 09/17/2021 11:07   DG Tibia/Fibula Right  Result Date: 2021-09-17 CLINICAL DATA:  Post recent RIGHT partial foot amputation, redness of foot and lower leg, diabetes mellitus, concern for necrotizing fasciitis or osteomyelitis EXAM: RIGHT TIBIA AND FIBULA - 2 VIEW COMPARISON:  None available FINDINGS: Osseous demineralization. Knee and ankle joint alignments normal. No fracture, dislocation or bone destruction. Skin clips at anterior lower leg. Extensive atherosclerotic calcifications with vascular stents at popliteal artery. IMPRESSION: No acute osseous abnormalities. Electronically Signed   By: Lavonia Dana M.D.   On: 17-Sep-2021 11:00   DG Chest Port 1 View  Result Date: 2021-09-17 CLINICAL DATA:  Weakness.  Possible sepsis. EXAM: PORTABLE CHEST 1 VIEW COMPARISON:  08/16/2021 FINDINGS: The lungs are clear without focal pneumonia, edema, pneumothorax or pleural  effusion. The cardiopericardial silhouette is within normal limits for size. The visualized bony structures of the thorax are unremarkable. Telemetry leads overlie the chest. IMPRESSION: Stable.  No acute findings. Electronically Signed   By: Misty Stanley M.D.   On: 2021/09/17 10:11     Assessment and Plan:   #SVT Likely AT :: Cardiology consulted for  evaluation of tachycardia and hypotension.  The patient's most recent ECGs were technically poor in the setting of frequent tremors by the patient.  On review of the patient's telemetry however, he is clearly in an SVT with a heart rate that is fixed at ~128 and is regular.  This would be more characteristic of an atrial tachycardia as opposed to sinus tachycardia, atrial flutter or atrial fibrillation (although atrial flutter with variable block is still a possibility).  Regardless, the patient has many reasons to be tachycardic with his underlying sepsis without source control being the greatest culprit.  He is currently on metoprolol succinate 50 Mg daily.  I recommend discontinuing this medication given that he has hypotension and sepsis.  In terms of starting amiodarone, I do not think there is an urgency to start amiodarone at this time.  If his hypotension does not improve with discontinuation of metoprolol and treatment of his sepsis and/or his tachycardia worsens, then starting IV amiodarone would be appropriate.  Further, the patient is not on systemic oral anticoagulation as an outpatient due to patient and family preference and systemic anticoagulation is contraindicated currently because he has a GI bleed.  With this in mind, performing an elective electrical cardioversion would not be ideal given the inability to anticoagulate him afterwards.  I recommend getting a repeat better quality EKG and maintain telemetry. -Stop metoprolol succinate 50 mg daily given hypotension -If hypotension persists and/or tachycardia worsens then IV amiodarone would  be appropriate -Repeat EKG -Maintain telemetry -Keep K>4, Mg>2  #HFrEF (EF = 35-40%) ::Appears dry to euvolemic on my exam. Does not appear to be acutely decompensated from a CHF standpoint.  -hold home metoprolol and losartan given hypotension and sepsis -daily weights -strick I/Os  #Persistent Afib ::Has known Afib rate controlled with metoprolol as an outpatient. He does not appear to be in Afib currently but rather an AT vs flutter. -holding metoprolol as above -no anticoagulation given bleeding  #HTN #HLD -hold losartan and metoprolol as above -continue atorvastatin  #Sepsis -management per primary team   Risk Assessment/Risk Scores:          CHA2DS2-VASc Score = 6  This indicates a 9.7% annual risk of stroke. The patient's score is based upon: Age 40 HTN CHF Vascular disease DM2        For questions or updates, please contact [place] Please consult [email or web address] for contact info under    Signed, Hershal Coria, MD  09/09/2021 5:48 PM

## 2021-09-09 NOTE — Progress Notes (Addendum)
     Plan for conversion of TMA to AKA once patient is medically optimized.  Pt admitted with sepsis secondary to dry leg gangrene, AKI, and encephalopathy.  Cr 3.23 09/09/21, leukocytosis WBC 16.3.    PE: no change in the appearence of the right LE dry gangrene  Roxy Horseman PA-C  VASCULAR STAFF ADDENDUM: I have independently interviewed and examined the patient. I agree with the above.   [name]. Stanford Breed, MD [facility] Phone Number: [phone] 09/09/2021 4:23 PM

## 2021-09-09 NOTE — Progress Notes (Signed)
Patient ID: Allen Yu, male   DOB: 03/01/1945, 76 y.o.   MRN: [id number]    Progress Note   Subjective   Day # 4  CC; rectal bleeding/bloody stool, chronic anemia  Plavix on hold since admit  Labs today-WBC 16.3/  Hemoglobin 5.6 on admit> transfused x3> 11.8> 10.9> 11.3> 9.9 BUN 89/creatinine 3.23   Patient alert and cooperative this morning, answers me appropriately if speak very close to his ear.  No complaint of abdominal pain Just had a bowel movement, per nursing brown and normal-appearing no blood    Objective   Vital signs in last 24 hours: Temp:  [97.5 F (36.4 C)-98.2 F (36.8 C)] 97.5 F (36.4 C) (08/19 1107) Pulse Rate:  [100-128] 125 (08/19 1107) Resp:  [16-20] 20 (08/19 1107) BP: (96-130)/(41-79) 96/65 (08/19 1107) SpO2:  [94 %-100 %] 95 % (08/19 1107) Last BM Date : 09/08/21 General:    Elderly African-American male in NAD, very hard of hearing Heart:  irRegular rate and rhythm; no murmurs Lungs: Respirations even and unlabored, lungs CTA bilaterally Abdomen:  Soft, nontender and nondistended. Normal bowel sounds. Extremities: Status post left BKA, right lower extremity changes of dry gangrene, open wound Neurologic:  Alert and cooperative Psych:  Cooperative. Normal mood and affect.  Intake/Output from previous day: 08/18 0701 - 08/19 0700 In: 1825.6 [P.O.:480; I.V.:1145.6; IV Piggyback:200] Out: 1400 [Urine:1400] Intake/Output this shift: Total I/O In: 480 [P.O.:480] Out: 475 [Urine:475]  Lab Results: Recent Labs    09/08/21 0608 09/08/21 1323 09/08/21 1434 09/09/21 0555  WBC 15.4* 13.9*  --  16.3*  HGB 10.2* 10.9* 11.3* 9.9*  HCT 29.3* 30.7* 32.7* 29.0*  PLT 434* 403*  --  393   BMET Recent Labs    09/07/21 0500 09/08/21 1323 09/09/21 0555  NA 134* 136 140  K 4.1 3.6 4.1  CL 99 105 106  CO2 20* 22 24  GLUCOSE 151* 98 55*  BUN 136* 104* 89*  CREATININE 5.88* 3.94* 3.23*  CALCIUM 8.8* 8.7* 9.2   LFT Recent Labs     09/08/21 1323  PROT 6.4*  ALBUMIN 2.4*  AST 22  ALT 13  ALKPHOS 87  BILITOT 0.8   PT/INR No results for input(s): "LABPROT", "INR" in the last 72 hours.       Assessment / Plan:    #76 76 year old African-American male with history of chronic anemia/no iron deficiency, admitted with sepsis and at that time noted to have hemoglobin of 5.6 Transfused 3 units with hemoglobin up to 11.8-hemoglobin overall has been stable since  Had a dark bloody bowel movement reported early on the morning of 09/08/2021, bowel movement since have been brown and normal-appearing  This is in the setting of aspirin and Plavix disease Plavix on hold since admit)  Prior colonoscopy 2016 documented diverticulosis CT imaging from March 2023 had shown mild to moderate circumferential wall thickening of the rectum, and sigmoid suggestive of a colitis  He is certainly at risk for ischemic colitis, not rule out component of a chronic low-grade colitis   #2 sepsis secondary to dry gangrene right lower extremity-plan is for amputation next week  #3 severe peripheral vascular disease status post prior stenting to the right lower extremity, very recent amputation of the right third toe  #4 dementia 5.  Decreased auditory acuity 6.  Congestive heart failure with EF 35 to 40% 7.  Chronic kidney disease stage III  Plan; continue to hold Plavix Continue serial hemoglobins will decrease to once  daily We will discuss potentially pursuing CT to assess for possible left-sided colitis   Principal Problem:   Sepsis ([place]) Active Problems:   AKI (acute kidney injury) ([place])   Pressure injury of skin     LOS: 3 days   [name]  PA-C 09/09/2021, 11:20 AM

## 2021-09-09 NOTE — Progress Notes (Deleted)
PHARMACY NOTE:  ANTIMICROBIAL RENAL DOSAGE ADJUSTMENT  Current antimicrobial regimen includes a mismatch between antimicrobial dosage and estimated renal function.  As per policy approved by the Pharmacy & Therapeutics and Medical Executive Committees, the antimicrobial dosage will be adjusted accordingly.  Current antimicrobial dosage:  cefepime 1 g IV q24h  Indication: wound infection/ pseudomonas+enterococcus UTI  Renal Function:   Estimated Creatinine Clearance: 13.3 mL/min (A) (by C-G formula based on SCr of 3.23 mg/dL (H)). '[]'$      On intermittent HD, scheduled: '[]'$      On CRRT    Antimicrobial dosage has been changed to:  cefepime 2 g IV q24h Since patient received cefepime 1 g IV today, giving additional 1 g IV dose   Additional comments:   Thank you for allowing pharmacy to be a part of this patient's care.  Eliseo Gum, PharmD PGY1 Pharmacy Resident   09/09/2021  10:26 AM

## 2021-09-09 NOTE — Progress Notes (Signed)
Progress Note   Patient: Allen Yu IRS:[id number] DOB: [birth year] DOA: 09/06/2021     3 DOS: the patient was seen and examined on 09/09/2021   Brief hospital course: Mr. Duhe is a 76 year old gentleman with a history of dementia, peripheral vascular disease with previous transmetatarsal amputation, urinary retention requiring chronic Foley catheter, diabetes, ischemic cardiomyopathy, ongoing tobacco abuse, paroxysmal atrial fibrillation who presented with several days of failure to thrive.  On further evaluation he was found to have sepsis secondary to dry gangrene and also complicated by acute kidney injury and metabolic encephalopathy.    Subjective Patient seen and examined at bedside.  Unable to respond to my questions asked.  Appears to be alert, awake, not oriented.  Had a hypoglycemic episode early this a.m.   Assessment and Plan:  Sepsis due to dry leg gangrene PVD Currently afebrile, with leukocytosis, tachycardic BC x2, NGTD Procalcitonin 0.41 Appreciate vascular surgery's management, plan for transtibial amputation next week once stabilized Continue vancomycin, cefepime, stop clindamycin due to risk of C.diff, start flagyl Hold home Plavix, due to possible GI bleed  Acute metabolic encephalopathy Patient with underlying history of dementia, states to be agitated, hitting and cursing at staff Delirium precautions Soft restraints implemented as needed  Acute kidney injury on CKD stage IIIb  Hyperphosphatemia Creatinine baseline around 1.7-2 Creatinine improving s/p IV fluids  Continue IV fluids Hold home torsemide, losartan Renally dose meds and avoid nephrotoxic medications Daily BMP  ?UTI Chronic urinary retention with chronic Foley catheter Urine culture growing Pseudomonas aeruginosa, E faecalis (as noted in the past), possible colonization Recently changed Foley Antibiotics as above Monitor closely  Acute on chronic anemia of CKD Possible GIB-  noted to have large bloody stool on 09/08/2021 FOBT positive Status posttransfusion of 3 units PRBCs, transfuse if hemoglobin drifts down to 7 GI consulted, appreciate recs, will follow and monitor for possible scope if significant drop in hemoglobin with bleeding Continue PPI Frequent CBC checks  Paroxysmal A-fib Heart rate uncontrolled EKG showing A-fib/flutter On Toprol Due to soft BP and history of CHF, will start amiodarone drip Not on any anticoagulation, currently possibly having GI bleed so will hold off Telemetry  Chronic systolic and diastolic HF Appears somewhat dry, recent poor oral intake, decreased urine output Last echo done 6/23 showed EF of 35 to 00%, grade 1 diastolic dysfunction Monitor closely as receiving gentle hydration Hold home torsemide for now Strict I's and O's, daily weights  Diabetes mellitus type 2 with hypoglycemia Likely 2/2 poor oral intake Last A1c 6 on 6/23 SSI, held Semglee, Accu-Cheks, hypoglycemic protocol   Baseline dementia, failure to thrive Malnutrition Continue Aricept, mirtazapine, Risperdal Delirium precautions Nutrition consulted, appreciate recs  Goals of care discussion Ongoing goals of care discussion has been initiated Patient with multiple comorbidities, failure to thrive, dementia, overall poor prognosis Palliative consult             Physical Exam: Vitals:   09/09/21 0421 09/09/21 0759 09/09/21 1107 09/09/21 1641  BP: 126/63 (!) 120/41 96/65 (!) 101/46  Pulse: 100 (!) 120 (!) 125 (!) 129  Resp: '18 16 20 20  '$ Temp: 97.9 F (36.6 C)  (!) 97.5 F (36.4 C) (!) 97.5 F (36.4 C)  TempSrc: Oral  Oral Oral  SpO2: 100% 99% 95% 98%  Weight:      Height:        General: Chronically ill-appearing, very deconditioned, cachectic, HOH, awake, alert, oriented to self Cardiovascular: S1, S2 present Respiratory: CTAB Abdomen: Soft, nontender,  nondistended, bowel sounds present Musculoskeletal: No bilateral lower  extremity edema noted, left AKA with well-healed surgical stump, right TMA with staples in place, right anterior shin with ulceration Skin: As noted above Psychiatry: Unable to assess   Disposition: Status is: Inpatient Remains inpatient appropriate because: Septic with infected right foot and severe acute kidney injury.  Planned Discharge Destination:  To be determined      Author: Alma Friendly, MD 09/09/2021 5:08 PM  For on call review [email or web address].

## 2021-09-09 NOTE — Progress Notes (Signed)
Hypoglycemic Event  CBG: 66   Treatment: 4 oz juice/soda  Symptoms: None  Follow-up CBG: Time:1710   CBG Result:80   Possible Reasons for Event: Inadequate meal intake  Comments/MD notified:    Allen Yu

## 2021-09-09 NOTE — Progress Notes (Signed)
Hypoglycemic Event  CBG: 66  Treatment: 8 oz juice/soda  Symptoms: None  Follow-up CBG: [id number] CBG Result:58  Possible Reasons for Event: Unknown  Comments/MD notified:no    Lajean Saver

## 2021-09-09 NOTE — Progress Notes (Addendum)
Pharmacy Antibiotic Note  Allen Yu is a 76 y.o. male admitted on 09/06/2021 with sepsis due to dry leg gangrene. Pharmacy has been consulted for vancomycin dosing.   WBC 16.3, Scr improving to 3.23 (CrCl 13.3 mL/min). Vanc random this morning came back at 21. Uop improving at 1,400 mL/24 hr. Urine cx growing pseudomonas and enterococcus.   Plan: Vancomycin 500 mg IV x 1 at 1600 - recheck vanc level tomorrow to see how clearing Cefepime 1 g IV every 24 hours Clindamycin 300 mg q6 >> (8/27)  F/u renal fxn, C&S, clinical status and levels as needed Fu vascular surgery plans   Height: '6\' 1"'$  (185.4 cm) Weight: 47.7 kg (105 lb 2.6 oz) IBW/kg (Calculated) : 79.9  Temp (24hrs), Avg:97.8 F (36.6 C), Min:97.6 F (36.4 C), Max:98.2 F (36.8 C)  Recent Labs  Lab 09/06/21 0929 09/06/21 1440 09/07/21 0500 09/07/21 0500 09/08/21 0608 09/08/21 1323 09/09/21 0555  WBC 17.3* 16.5* 15.6*  --  15.4* 13.9* 16.3*  CREATININE 7.74* 7.23* 5.88*  --   --  3.94* 3.23*  LATICACIDVEN 1.2  --   --   --   --   --   --   VANCORANDOM  --   --  13   < > 17  --  21   < > = values in this interval not displayed.     Estimated Creatinine Clearance: 13.3 mL/min (A) (by C-G formula based on SCr of 3.23 mg/dL (H)).     Antimicrobials this admission: Vanco 8/16 >> Cefepime 8/16>> Clindamycin 8/16>>   Microbiology results: 8/16 BCx: NGTD 8/16 UCx: >100k pseudomonas/enterococcus faecalis   Eliseo Gum, PharmD PGY1 Pharmacy Resident   09/09/2021  9:58 AM   Please check AMION for all [place] phone numbers After 10:00 PM, call [place] [phone]

## 2021-09-10 DIAGNOSIS — R933 Abnormal findings on diagnostic imaging of other parts of digestive tract: Secondary | ICD-10-CM | POA: Diagnosis not present

## 2021-09-10 DIAGNOSIS — Z66 Do not resuscitate: Secondary | ICD-10-CM

## 2021-09-10 DIAGNOSIS — K922 Gastrointestinal hemorrhage, unspecified: Secondary | ICD-10-CM | POA: Diagnosis not present

## 2021-09-10 DIAGNOSIS — N179 Acute kidney failure, unspecified: Secondary | ICD-10-CM | POA: Diagnosis not present

## 2021-09-10 DIAGNOSIS — I484 Atypical atrial flutter: Secondary | ICD-10-CM | POA: Diagnosis not present

## 2021-09-10 DIAGNOSIS — D649 Anemia, unspecified: Secondary | ICD-10-CM

## 2021-09-10 DIAGNOSIS — D62 Acute posthemorrhagic anemia: Secondary | ICD-10-CM | POA: Diagnosis not present

## 2021-09-10 DIAGNOSIS — Z515 Encounter for palliative care: Secondary | ICD-10-CM

## 2021-09-10 DIAGNOSIS — I739 Peripheral vascular disease, unspecified: Secondary | ICD-10-CM | POA: Diagnosis not present

## 2021-09-10 DIAGNOSIS — A419 Sepsis, unspecified organism: Secondary | ICD-10-CM | POA: Diagnosis not present

## 2021-09-10 DIAGNOSIS — F03911 Unspecified dementia, unspecified severity, with agitation: Secondary | ICD-10-CM | POA: Diagnosis not present

## 2021-09-10 DIAGNOSIS — I5042 Chronic combined systolic (congestive) and diastolic (congestive) heart failure: Secondary | ICD-10-CM | POA: Diagnosis not present

## 2021-09-10 DIAGNOSIS — Z7189 Other specified counseling: Secondary | ICD-10-CM

## 2021-09-10 DIAGNOSIS — F03918 Unspecified dementia, unspecified severity, with other behavioral disturbance: Secondary | ICD-10-CM | POA: Diagnosis not present

## 2021-09-10 LAB — CBC WITH DIFFERENTIAL/PLATELET
Abs Immature Granulocytes: 0.13 10*3/uL — ABNORMAL HIGH (ref 0.00–0.07)
Basophils Absolute: 0 10*3/uL (ref 0.0–0.1)
Basophils Relative: 0 %
Eosinophils Absolute: 0.1 10*3/uL (ref 0.0–0.5)
Eosinophils Relative: 0 %
HCT: 29 % — ABNORMAL LOW (ref 39.0–52.0)
Hemoglobin: 10 g/dL — ABNORMAL LOW (ref 13.0–17.0)
Immature Granulocytes: 1 %
Lymphocytes Relative: 5 %
Lymphs Abs: 0.8 10*3/uL (ref 0.7–4.0)
MCH: 31.3 pg (ref 26.0–34.0)
MCHC: 34.5 g/dL (ref 30.0–36.0)
MCV: 90.9 fL (ref 80.0–100.0)
Monocytes Absolute: 1 10*3/uL (ref 0.1–1.0)
Monocytes Relative: 7 %
Neutro Abs: 13.8 10*3/uL — ABNORMAL HIGH (ref 1.7–7.7)
Neutrophils Relative %: 87 %
Platelets: 328 10*3/uL (ref 150–400)
RBC: 3.19 MIL/uL — ABNORMAL LOW (ref 4.22–5.81)
RDW: 14.5 % (ref 11.5–15.5)
WBC: 15.8 10*3/uL — ABNORMAL HIGH (ref 4.0–10.5)
nRBC: 0 % (ref 0.0–0.2)

## 2021-09-10 LAB — BASIC METABOLIC PANEL
Anion gap: 10 (ref 5–15)
BUN: 68 mg/dL — ABNORMAL HIGH (ref 8–23)
CO2: 19 mmol/L — ABNORMAL LOW (ref 22–32)
Calcium: 9 mg/dL (ref 8.9–10.3)
Chloride: 109 mmol/L (ref 98–111)
Creatinine, Ser: 2.39 mg/dL — ABNORMAL HIGH (ref 0.61–1.24)
GFR, Estimated: 28 mL/min — ABNORMAL LOW (ref >60)
Glucose, Bld: 116 mg/dL — ABNORMAL HIGH (ref 70–99)
Potassium: 3.7 mmol/L (ref 3.5–5.1)
Sodium: 138 mmol/L (ref 135–145)

## 2021-09-10 LAB — BPAM RBC
Blood Product Expiration Date: 202309222359
Blood Product Expiration Date: 202309222359
Blood Product Expiration Date: 202309222359
ISSUE DATE / TIME: 202308161049
Unit Type and Rh: 5100
Unit Type and Rh: 5100
Unit Type and Rh: 5100

## 2021-09-10 LAB — TYPE AND SCREEN
ABO/RH(D): O POS
Antibody Screen: NEGATIVE
Unit division: 0
Unit division: 0
Unit division: 0

## 2021-09-10 LAB — GLUCOSE, CAPILLARY
Glucose-Capillary: 148 mg/dL — ABNORMAL HIGH (ref 70–99)
Glucose-Capillary: 156 mg/dL — ABNORMAL HIGH (ref 70–99)
Glucose-Capillary: 163 mg/dL — ABNORMAL HIGH (ref 70–99)
Glucose-Capillary: 168 mg/dL — ABNORMAL HIGH (ref 70–99)
Glucose-Capillary: 244 mg/dL — ABNORMAL HIGH (ref 70–99)
Glucose-Capillary: 98 mg/dL (ref 70–99)

## 2021-09-10 LAB — VANCOMYCIN, RANDOM: Vancomycin Rm: 21 ug/mL

## 2021-09-10 LAB — HEMOGLOBIN AND HEMATOCRIT, BLOOD
HCT: 29.7 % — ABNORMAL LOW (ref 39.0–52.0)
Hemoglobin: 10 g/dL — ABNORMAL LOW (ref 13.0–17.0)

## 2021-09-10 MED ORDER — METOPROLOL SUCCINATE ER 50 MG PO TB24
50.0000 mg | ORAL_TABLET | Freq: Every day | ORAL | Status: DC
  Administered 2021-09-10: 50 mg via ORAL
  Filled 2021-09-10: qty 1

## 2021-09-10 MED ORDER — VANCOMYCIN HCL 500 MG/100ML IV SOLN
500.0000 mg | Freq: Once | INTRAVENOUS | Status: AC
  Administered 2021-09-10: 500 mg via INTRAVENOUS
  Filled 2021-09-10: qty 100

## 2021-09-10 MED ORDER — ORAL CARE MOUTH RINSE
15.0000 mL | OROMUCOSAL | Status: DC
  Administered 2021-09-10 – 2021-09-12 (×2): 15 mL via OROMUCOSAL

## 2021-09-10 MED ORDER — METOPROLOL TARTRATE 25 MG PO TABS: 25.0000 mg | ORAL_TABLET | Freq: Two times a day (BID) | ORAL | Status: DC

## 2021-09-10 MED ORDER — METOPROLOL TARTRATE 25 MG PO TABS
25.0000 mg | ORAL_TABLET | Freq: Two times a day (BID) | ORAL | Status: DC
  Administered 2021-09-11: 25 mg via ORAL
  Filled 2021-09-10: qty 1

## 2021-09-10 MED ORDER — CHLORHEXIDINE GLUCONATE CLOTH 2 % EX PADS
6.0000 | MEDICATED_PAD | Freq: Every day | CUTANEOUS | Status: DC
  Administered 2021-09-10 – 2021-09-15 (×6): 6 via TOPICAL

## 2021-09-10 MED ORDER — ORAL CARE MOUTH RINSE: 15.0000 mL | OROMUCOSAL | Status: DC | PRN

## 2021-09-10 NOTE — Progress Notes (Signed)
Patient ID: Allen Yu, male   DOB: 12-12-1945, 76 y.o.   MRN: [id number]    Progress Note   Subjective   Day # 5  CC: Episode of rectal bleeding/bloody stool, chronic anemia  Plavix on hold since admit  Labs; WBC 15.8/hemoglobin 10/hematocrit 29.0 BUN 68/creatinine 2.3-improved  Cooperative Nursing reports bowel movement this morning, brown normal-appearing no blood    Objective   Vital signs in last 24 hours: Temp:  [97.5 F (36.4 C)-98.3 F (36.8 C)] 98.3 F (36.8 C) (08/20 0410) Pulse Rate:  [45-133] 133 (08/20 0820) Resp:  [14-20] 20 (08/20 0820) BP: (94-156)/(46-122) 153/71 (08/20 0820) SpO2:  [97 %-100 %] 100 % (08/20 0820) Weight:  [47.5 kg] 47.5 kg (08/20 0410) Last BM Date : 09/10/21 General:    Elderly African-American male in NAD, alert, cooperative, very hard of hearing, did not answer questions Heart:  Regular rate and rhythm; no murmurs Lungs: Respirations even and unlabored, lungs CTA bilaterally Abdomen:  Soft, nontender and nondistended. Normal bowel sounds. Extremities: S/p left BKA, right lower extremity changes of dry gangrene, open wound Neurologic:  Alert , cooperative   Intake/Output from previous day: 08/19 0701 - 08/20 0700 In: 2209.3 [P.O.:617; I.V.:1462.7; IV Piggyback:129.6] Out: 1275 [Urine:1275] Intake/Output this shift: No intake/output data recorded.  Lab Results: Recent Labs    09/08/21 1323 09/08/21 1434 09/09/21 0555 09/09/21 1426 09/10/21 0546 09/10/21 0925  WBC 13.9*  --  16.3*  --  15.8*  --   HGB 10.9*   < > 9.9* 9.5* 10.0* 10.0*  HCT 30.7*   < > 29.0* 28.1* 29.0* 29.7*  PLT 403*  --  393  --  328  --    < > = values in this interval not displayed.   BMET Recent Labs    09/08/21 1323 09/09/21 0555 09/10/21 0546  NA 136 140 138  K 3.6 4.1 3.7  CL 105 106 109  CO2 22 24 19*  GLUCOSE 98 55* 116*  BUN 104* 89* 68*  CREATININE 3.94* 3.23* 2.39*  CALCIUM 8.7* 9.2 9.0   LFT Recent Labs    09/08/21 1323   PROT 6.4*  ALBUMIN 2.4*  AST 22  ALT 13  ALKPHOS 87  BILITOT 0.8   PT/INR No results for input(s): "LABPROT", "INR" in the last 72 hours.  Studies/Results: No results found.     Assessment / Plan:    #32 76 year old African-American male with history of chronic anemia, no  iron deficiency, admitted with sepsis secondary to dry gangrene extremity  Hemoglobin 5.6 on admission, transfused 3 units-hemoglobin overall stable since 1 episode of dark bloody bowel movement 09/08/2021-bowel movements have been brown and nonbloody since, hemoglobin overall stable  Occurred in the setting of aspirin and Plavix-Plavix has been on hold since admission  Patient did have prior colonoscopy 2016 documenting diverticulosis, otherwise negative. CT imaging from March 2023 had shown mild to moderate circumferential wall thickening of the rectum and sigmoid suggestive of a mild colitis  Patient certainly at risk for ischemic colitis, cannot rule out other low-grade colitis.  #2 Dry  gangrene right lower extremity-plan is for amputation at some point next week  #3 severe peripheral vascular disease #4 dementia #5 very hard of hearing #6 congestive heart failure with EF 35 to 40% # 7 chronic kidney disease stage III  #8 persistent tachycardia-SVT #9 hx of atrial fibrillation  Plan as patient has not had any further active bleeding, if Plavix needs to be resumed, okay from  GI perspective Continue to check hemoglobin daily We will proceed with noncontrasted CT, to assess for possibility of an ongoing mild colitis as etiology for episode of rectal bleeding. Patient is a high risk candidate for complications with any endoscopic evaluation, no plans for sigmoidoscopy or colonoscopy at this time  Palliative care consultation is pending.      Principal Problem:   Sepsis ([place]) Active Problems:   AKI (acute kidney injury) ([place])   Pressure injury of skin   Melena   Abnormal CT scan,  gastrointestinal tract     LOS: 4 days   Ilani Otterson PA-C 09/10/2021, 12:25 PM

## 2021-09-10 NOTE — Consult Note (Signed)
Palliative Care Consult Note                                  Date: 09/10/2021   Patient Name: Allen Yu  DOB: May 15, 1945  MRN: [id number]  Age / Sex: 76 y.o., male  PCP: Allen Noble, MD Referring Physician: Alma Friendly, MD  Reason for Consultation: Establishing goals of care  HPI/Patient Profile: 76 y.o. 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  He presented to [facility] ED on 09/06/2021 with failure to thrive for several days.  His wife reported he was sleeping a lot and not eating very much.  In the ED he was found to have creatinine of 7.74 and hemoglobin of 5.6. He was admitted with sepsis secondary to dry leg gangrene, along with severe AKI and acute encephalopathy.   Subjective:   I have reviewed medical records including EPIC notes, labs and imaging, received report from the team, and assessed the patient at bedside.  He is awake and alert, but not able to answer questions.  He is in bilateral wrist restraints.   I spoke with his wife Allen Yu by phone to discuss diagnosis, prognosis, [place], EOL wishes, disposition, and options.  Patient is known to PMT from previous hospitalizations. I re-introduced Palliative Medicine as specialized medical care for people living with serious illness. It focuses on providing relief from the symptoms and stress of a serious illness.   Created space and opportunity for family to explore thoughts and feelings regarding current medical situation. Values and goals of care important to patient and family were attempted to be elicited.  Life Review: Allen Yu and [name] have been together for 75 years and married for 17 years.  They have children and grandchildren separately and they are raising her  21 year old twin grandchildren in their home.  [name] served in [employer] and is fully [place] service-connected.  He also worked with [employer] and served in the [employer].  Functional Status: At baseline, [name] is able to transfer himself from wheelchair to bed or car.  He needs assistance with bathing and dressing.  He is incontinent.  He is able to feed himself.  Allen Yu reports that he usually eats well at home.  When he does not feel like eating as much, she will encourage him but does not want to push.   Code Status:  There is a gold DNR form on file in [place].  I confirmed with Allen Yu that [name]'s wishes are not to be resuscitated.  This is consistent with previous [place] discussions.  Discussion: We discussed patient's current illness and what it means in the larger context of his/her ongoing co-morbidities. Current clinical status was reviewed. Natural disease trajectory of chronic illness was discussed. Discussed that [name] has multiple medical problems including peripheral artery disease, dementia, chronic kidney disease, and chronic heart failure. Discussed that chronic illness results in decreased functional status over time, as patients do not usually return to previous baseline after having a major illness.   Allen Yu reports that [name]'s left AKA in 2022 was very difficult on them both.  It was an unexpected event, and caused [name] to be very depressed afterward. She believes it has greatly affected his quality of life.  Fortunately, they have been able to obtain services through the [place] to help with his care at home. They did have a caregiver for 36 hours/week, until there were issues with certain staff.  The plan moving forward is that Allen Yu will be paid to take care of [name] at home.  They also have respite care, which provides 30 days/year of overnight care in a [place] facility.  [name]'s goal is for [name] to return home and to be "comfortable".  She acknowledges that he has "been through  a lot" and is "tired". She indicates she would not want to pursue any type of aggressive life-prolonging measures.  Discussed plan for right AKA when he is medically optimized.  When [name] was initially admitted, Allen Yu was not sure she would even want him to go through surgery.  She is now agreeable to surgery as [name]'s acute issues have improved.  Discussed that creatinine has decreased to 2.39 today.   Questions and concerns addressed. Family encouraged to call with questions or concerns.     Review of Systems  Unable to perform ROS: Dementia    Objective:   Primary Diagnoses: Present on Admission:  Sepsis ([place])  AKI (acute kidney injury) [facility])   Physical Exam Vitals reviewed.  Constitutional:      General: He is not in acute distress.    Comments: Chronically ill-appearing  Cardiovascular:     Rate and Rhythm: Tachycardia present.  Pulmonary:     Effort: Pulmonary effort is normal.  Musculoskeletal:     Comments: Right toes amputated     Left Lower Extremity: Left leg is amputated above knee.  Neurological:     Mental Status: He is alert.     Vital Signs:  BP (!) 146/80 (BP Location: Left Arm)   Pulse (!) 132   Temp 98.1 F (36.7 C) (Axillary)   Resp 18   Ht '6\' 1"'$  (1.854 m)   Wt 47.5 kg   SpO2 99%   BMI 13.82 kg/m   Palliative Assessment/Data: PPS 40%     Assessment & Plan:   SUMMARY OF RECOMMENDATIONS   CODE STATUS changed to DNR/DNI Continue current plan of care Goal is for patient to ultimately return home PMT will continue to follow and support  Primary Decision Maker: Wife Allen Yu  Prognosis:  Unable to determine  Discharge Planning:  To Be Determined    Thank you for allowing [place] to participate in the care of Allen Yu  MDM - [place]  Signed by: Allen Confer, NP Palliative Medicine Team  Team Phone # [phone]  For individual providers, please see AMION

## 2021-09-10 NOTE — Progress Notes (Signed)
Pharmacy Antibiotic Note  Allen Yu is a 76 y.o. male admitted on 09/06/2021 with sepsis due to dry leg gangrene. Pharmacy has been consulted for vancomycin dosing.   WBC 15.8, Scr improving to 2.39 (CrCl 17.9 mL/min). Vanc random this morning came back at 21. Uop slightly down today 1,275 mL/24 hr. Urine cx growing pseudomonas and enterococcus.   Plan: Vancomycin 500 mg IV x 1 at 1600 - recheck vanc level tomorrow to see how clearing Cefepime 1 g IV every 24 hours Clindamycin 300 mg q6 >> (8/27)  F/u renal fxn, clinical status and levels as needed Fu vascular surgery plans   Height: '6\' 1"'$  (185.4 cm) Weight: 47.5 kg (104 lb 11.5 oz) IBW/kg (Calculated) : 79.9  Temp (24hrs), Avg:98 F (36.7 C), Min:97.5 F (36.4 C), Max:98.3 F (36.8 C)  Recent Labs  Lab 09/06/21 0929 09/06/21 1440 09/06/21 1440 09/07/21 0500 09/08/21 0608 09/08/21 1323 09/09/21 0555 09/09/21 1142 09/10/21 0546 09/10/21 1020  WBC 17.3* 16.5*  --  15.6* 15.4* 13.9* 16.3*  --  15.8*  --   CREATININE 7.74* 7.23*  --  5.88*  --  3.94* 3.23*  --  2.39*  --   LATICACIDVEN 1.2  --   --   --   --   --   --   --   --   --   VANCORANDOM  --   --    < > 13 17  --  21 18  --  21   < > = values in this interval not displayed.     Estimated Creatinine Clearance: 17.9 mL/min (A) (by C-G formula based on SCr of 2.39 mg/dL (H)).     Antimicrobials this admission: Vanco 8/16 >> Cefepime 8/16>> Clindamycin 8/16>>   Microbiology results: 8/16 BCx: NGTD 8/16 UCx: >100k pseudomonas/enterococcus faecalis   Eliseo Gum, PharmD PGY1 Pharmacy Resident   09/10/2021  1:35 PM   Please check AMION for all [place] phone numbers After 10:00 PM, call [place] [phone]

## 2021-09-10 NOTE — Progress Notes (Addendum)
     Plan for conversion of TMA to AKA once patient is medically optimized.     Pt admitted with sepsis secondary to dry leg gangrene, AKI, and encephalopathy.    Cr 3.23 09/09/21, leukocytosis WBC 16.3.  Continue vancomycin, cefepime, stop clindamycin due to risk of C.diff, start flagyl.  Acute on chronic anemia with + bloody stool 09/08/21 requiring 3 units transfusion of PRBC.  Improved HGB 10.0 09/10/21.    Afib not on anticoagulation due to GI bleed.     PE: no change in the appearence of the right LE dry gangrene   Roxy Horseman PA-C  VASCULAR STAFF ADDENDUM: I have independently interviewed and examined the patient. I agree with the above.  Seems to be getting closer to OR. Tentatively planned for AKA Wednesday.   [name]. Stanford Breed, MD [facility] Phone Number: [phone] 09/10/2021 2:30 PM

## 2021-09-10 NOTE — Progress Notes (Signed)
Progress Note   Patient: Allen Yu:[id number] DOB: 1945-06-06 DOA: 09/06/2021     4 DOS: the patient was seen and examined on 09/10/2021   Brief hospital course: Mr. Morrell is a 76 year old gentleman with a history of dementia, peripheral vascular disease with previous transmetatarsal amputation, urinary retention requiring chronic Foley catheter, diabetes, ischemic cardiomyopathy, ongoing tobacco abuse, paroxysmal atrial fibrillation who presented with several days of failure to thrive.  On further evaluation he was found to have sepsis secondary to dry gangrene and also complicated by acute kidney injury and metabolic encephalopathy.    Subjective Patient denies any new complaints, appears to be more calm.   Assessment and Plan:  Sepsis due to dry leg gangrene PVD Currently afebrile, with leukocytosis, tachycardic BC x2, NGTD Procalcitonin 0.41 Appreciate vascular surgery's management, plan for transtibial amputation likely on 8/23, next week once stabilized Continue vancomycin, cefepime, stop clindamycin due to risk of C.diff, start flagyl GI on board, ok to restart plavix, defer to vascular team  Acute metabolic encephalopathy Improving Patient with underlying history of dementia, states to be agitated, hitting and cursing at staff Delirium precautions Soft restraints implemented as needed  Acute kidney injury on CKD stage IIIb  Hyperphosphatemia Creatinine baseline around 1.7-2 Creatinine improving s/p IV fluids  S/P IV fluids Hold home torsemide, losartan Renally dose meds and avoid nephrotoxic medications Daily BMP  ?UTI Chronic urinary retention with chronic Foley catheter Urine culture growing Pseudomonas aeruginosa, E faecalis (as noted in the past), possible colonization Recently changed Foley Antibiotics as above Monitor closely  Acute on chronic anemia of CKD Possible GIB- noted to have large bloody stool on 09/08/2021 FOBT positive Status  posttransfusion of 3 units PRBCs, transfuse if hemoglobin drifts down to 7 GI consulted, appreciate recs, will follow and monitor for possible scope if significant drop in hemoglobin with bleeding CC abdomen/pelvis ordered by GI to rule out colitis Continue PPI Frequent CBC checks  Paroxysmal A-fib Now in atrial flutter Heart rate uncontrolled EKG showing A-flutter Cardiology consulted, appreciate recs Start Lopressor Not on any anticoagulation, currently possibly having GI bleed  Telemetry  Chronic systolic and diastolic HF Volume status now stable after IV fluids Last echo done 6/23 showed EF of 35 to 08%, grade 1 diastolic dysfunction Hold home torsemide for now Strict I's and O's, daily weights  Diabetes mellitus type 2 with hypoglycemia Likely 2/2 poor oral intake Last A1c 6 on 6/23 SSI, held Semglee, Accu-Cheks, hypoglycemic protocol   Baseline dementia, failure to thrive Malnutrition Continue Aricept, mirtazapine, Risperdal Delirium precautions Nutrition consulted, appreciate recs  Goals of care discussion Ongoing goals of care discussion has been initiated Patient with multiple comorbidities, failure to thrive, dementia, overall poor prognosis Palliative consult             Physical Exam: Vitals:   09/10/21 0410 09/10/21 0600 09/10/21 0820 09/10/21 1235  BP: (!) 156/122 (!) 133/93 (!) 153/71 (!) 146/80  Pulse: (!) 130 (!) 45 (!) 133 (!) 132  Resp: '20 14 20 18  '$ Temp: 98.3 F (36.8 C)   98.1 F (36.7 C)  TempSrc: Axillary  Axillary Axillary  SpO2: 99% 97% 100% 99%  Weight: 47.5 kg     Height:        General: Chronically ill-appearing, very deconditioned, cachectic, HOH, awake, alert, oriented to self Cardiovascular: S1, S2 present Respiratory: CTAB Abdomen: Soft, nontender, nondistended, bowel sounds present Musculoskeletal: No bilateral lower extremity edema noted, left AKA with well-healed surgical stump, right TMA with staples  in place, right  anterior shin with ulceration Skin: As noted above Psychiatry: Unable to assess   Disposition: Status is: Inpatient Remains inpatient appropriate because: Septic with infected right foot and severe acute kidney injury.  Planned Discharge Destination:  To be determined      Author: Alma Friendly, MD 09/10/2021 4:09 PM  For on call review [email or web address].

## 2021-09-10 NOTE — Progress Notes (Signed)
Progress Note  Patient Name: Allen Yu Date of Encounter: 09/10/2021  [facility] Cardiologist: Allen Lesches, MD   Subjective   BP 146/80 this morning.  Creatinine 2.4, hemoglobin 10.0.  Oriented to person only.  Reports having pain but can't localize  Inpatient Medications    Scheduled Meds:  (feeding supplement) PROSource Plus  30 mL Oral TID BM   ascorbic acid  500 mg Oral BID   atorvastatin  40 mg Oral QHS   Chlorhexidine Gluconate Cloth  6 each Topical Daily   donepezil  10 mg Oral QHS   feeding supplement  237 mL Oral TID BM   ferrous sulfate  325 mg Oral Q breakfast   insulin aspart  0-9 Units Subcutaneous Q4H   metoprolol succinate  50 mg Oral Daily   mirtazapine  7.5 mg Oral QHS   multivitamin with minerals  1 tablet Oral Daily   mouth rinse  15 mL Mouth Rinse 4 times per day   pantoprazole (PROTONIX) IV  40 mg Intravenous Q12H   vancomycin variable dose per unstable renal function (pharmacist dosing)   Does not apply See admin instructions   zinc sulfate  220 mg Oral Daily   Continuous Infusions:  ceFEPime (MAXIPIME) IV 1 g (09/10/21 1045)   metronidazole 500 mg (09/10/21 0602)   PRN Meds: acetaminophen, docusate sodium, mouth rinse, polyethylene glycol, risperiDONE   Vital Signs    Vitals:   09/10/21 0410 09/10/21 0600 09/10/21 0820 09/10/21 1235  BP: (!) 156/122 (!) 133/93 (!) 153/71 (!) 146/80  Pulse: (!) 130 (!) 45 (!) 133 (!) 132  Resp: '20 14 20 18  '$ Temp: 98.3 F (36.8 C)   98.1 F (36.7 C)  TempSrc: Axillary  Axillary Axillary  SpO2: 99% 97% 100% 99%  Weight: 47.5 kg     Height:        Intake/Output Summary (Last 24 hours) at 09/10/2021 1317 Last data filed at 09/10/2021 1238 Gross per 24 hour  Intake 1729.26 ml  Output 1100 ml  Net 629.26 ml      09/10/2021    4:10 AM 09/08/2021    1:17 AM 09/08/2021   12:10 AM  Last 3 Weights  Weight (lbs) 104 lb 11.5 oz 105 lb 2.6 oz 105 lb 2.6 oz  Weight (kg) 47.5 kg 47.7 kg 47.7 kg       Telemetry    AFL rate 130 - Personally Reviewed  ECG    NO new ECG - Personally Reviewed  Physical Exam   GEN: Chronically ill-appearing Neck: No JVD Cardiac: Echocardiac, regular no murmurs, rubs, or gallops.  Respiratory: Clear to auscultation bilaterally. GI: Soft, nontender, non-distended  MS: No edema Neuro: Oriented to person only Psych: Unable to assess  Labs    High Sensitivity Troponin:  No results for input(s): "TROPONINIHS" in the last 720 hours.   Chemistry Recent Labs  Lab 09/06/21 0929 09/06/21 1440 09/07/21 0500 09/08/21 1323 09/09/21 0555 09/10/21 0546  NA 134* 132* 134* 136 140 138  K 4.9 4.4 4.1 3.6 4.1 3.7  CL 96* 97* 99 105 106 109  CO2 20* 19* 20* 22 24 19*  GLUCOSE 175* 230* 151* 98 55* 116*  BUN 134* 148* 136* 104* 89* 68*  CREATININE 7.74* 7.23* 5.88* 3.94* 3.23* 2.39*  CALCIUM 9.0 8.3* 8.8* 8.7* 9.2 9.0  MG  --  1.1* 2.1  --   --   --   PROT 8.1  --   --  6.4*  --   --  ALBUMIN 3.1*  --   --  2.4*  --   --   AST 23  --   --  22  --   --   ALT 13  --   --  13  --   --   ALKPHOS 104  --   --  87  --   --   BILITOT 0.7  --   --  0.8  --   --   GFRNONAA 7* 7* 9* 15* 19* 28*  ANIONGAP 18* 16* '15 9 10 10    '$ Lipids No results for input(s): "CHOL", "TRIG", "HDL", "LABVLDL", "LDLCALC", "CHOLHDL" in the last 168 hours.  Hematology Recent Labs  Lab 09/08/21 1323 09/08/21 1434 09/09/21 0555 09/09/21 1426 09/10/21 0546 09/10/21 0925  WBC 13.9*  --  16.3*  --  15.8*  --   RBC 3.41*  --  3.18*  --  3.19*  --   HGB 10.9*   < > 9.9* 9.5* 10.0* 10.0*  HCT 30.7*   < > 29.0* 28.1* 29.0* 29.7*  MCV 90.0  --  91.2  --  90.9  --   MCH 32.0  --  31.1  --  31.3  --   MCHC 35.5  --  34.1  --  34.5  --   RDW 14.6  --  14.4  --  14.5  --   PLT 403*  --  393  --  328  --    < > = values in this interval not displayed.   Thyroid No results for input(s): "TSH", "FREET4" in the last 168 hours.  BNP Recent Labs  Lab 09/06/21 1440  BNP 176.9*     DDimer No results for input(s): "DDIMER" in the last 168 hours.   Radiology    No results found.  Cardiac Studies     Patient Profile     76 y.o. 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  Appears 2:1 atrial flutter. -Not candidate for anticoagulation currently in setting of GI bleed -Would favor holding off on amiodarone given risk of chemical cardioversion and patient has not been on anticoagulation.  Would need to consider amiodarone though if hypotensive and no other options for rate control -BP improved today, he is hypertensive.  Restart metoprolol, will start Lopressor 25 mg twice daily.  Atrial flutter is difficult to rate control.  Suspect will ultimately need cardioverted once he recovers from acute illness and bleeding resolved and able to be on anticoagulation  Sepsis: Due to gangrene of right lower extremity.  Planning for amputation once medically optimized  Chronic systolic heart failure: EF 35 to 40%.  Holding home torsemide, losartan in setting of AKI.  He received gentle hydration with improvement in renal function.  Restart metoprolol as above.  AKI: Creatinine baseline 1.7-2.  Creatinine up to 7.7 on admission.  Improved with IV fluids, currently 2.4  GI bleeding: Large bloody stool on 8/18.  Status post 3 units PRBCs.  Plavix held.  GI following, planning CT abdomen, holding off on endoscopy at this time  Goals of care: Agree with palliative consult   For questions or updates, please  contact [place] Please consult [email or web address] for contact info under        Signed, Allen Heinz, MD  09/10/2021, 1:17 PM

## 2021-09-11 DIAGNOSIS — I5042 Chronic combined systolic (congestive) and diastolic (congestive) heart failure: Secondary | ICD-10-CM

## 2021-09-11 DIAGNOSIS — A419 Sepsis, unspecified organism: Secondary | ICD-10-CM | POA: Diagnosis not present

## 2021-09-11 DIAGNOSIS — I484 Atypical atrial flutter: Secondary | ICD-10-CM

## 2021-09-11 DIAGNOSIS — K921 Melena: Secondary | ICD-10-CM | POA: Diagnosis not present

## 2021-09-11 DIAGNOSIS — K922 Gastrointestinal hemorrhage, unspecified: Secondary | ICD-10-CM

## 2021-09-11 DIAGNOSIS — N179 Acute kidney failure, unspecified: Secondary | ICD-10-CM | POA: Diagnosis not present

## 2021-09-11 DIAGNOSIS — R933 Abnormal findings on diagnostic imaging of other parts of digestive tract: Secondary | ICD-10-CM | POA: Diagnosis not present

## 2021-09-11 LAB — CBC WITH DIFFERENTIAL/PLATELET
Abs Immature Granulocytes: 0.1 10*3/uL — ABNORMAL HIGH (ref 0.00–0.07)
Basophils Absolute: 0.1 10*3/uL (ref 0.0–0.1)
Basophils Relative: 0 %
Eosinophils Absolute: 0.1 10*3/uL (ref 0.0–0.5)
Eosinophils Relative: 0 %
HCT: 27.4 % — ABNORMAL LOW (ref 39.0–52.0)
Hemoglobin: 9.3 g/dL — ABNORMAL LOW (ref 13.0–17.0)
Immature Granulocytes: 1 %
Lymphocytes Relative: 4 %
Lymphs Abs: 0.8 10*3/uL (ref 0.7–4.0)
MCH: 31.6 pg (ref 26.0–34.0)
MCHC: 33.9 g/dL (ref 30.0–36.0)
MCV: 93.2 fL (ref 80.0–100.0)
Monocytes Absolute: 1.1 10*3/uL — ABNORMAL HIGH (ref 0.1–1.0)
Monocytes Relative: 5 %
Neutro Abs: 18.9 10*3/uL — ABNORMAL HIGH (ref 1.7–7.7)
Neutrophils Relative %: 90 %
Platelets: 319 10*3/uL (ref 150–400)
RBC: 2.94 MIL/uL — ABNORMAL LOW (ref 4.22–5.81)
RDW: 14.7 % (ref 11.5–15.5)
WBC: 21 10*3/uL — ABNORMAL HIGH (ref 4.0–10.5)
nRBC: 0 % (ref 0.0–0.2)

## 2021-09-11 LAB — GLUCOSE, CAPILLARY
Glucose-Capillary: 141 mg/dL — ABNORMAL HIGH (ref 70–99)
Glucose-Capillary: 209 mg/dL — ABNORMAL HIGH (ref 70–99)
Glucose-Capillary: 210 mg/dL — ABNORMAL HIGH (ref 70–99)
Glucose-Capillary: 211 mg/dL — ABNORMAL HIGH (ref 70–99)
Glucose-Capillary: 224 mg/dL — ABNORMAL HIGH (ref 70–99)
Glucose-Capillary: 234 mg/dL — ABNORMAL HIGH (ref 70–99)

## 2021-09-11 LAB — VANCOMYCIN, RANDOM: Vancomycin Rm: 23 ug/mL

## 2021-09-11 LAB — BASIC METABOLIC PANEL
Anion gap: 14 (ref 5–15)
BUN: 61 mg/dL — ABNORMAL HIGH (ref 8–23)
CO2: 17 mmol/L — ABNORMAL LOW (ref 22–32)
Calcium: 8.9 mg/dL (ref 8.9–10.3)
Chloride: 107 mmol/L (ref 98–111)
Creatinine, Ser: 2.46 mg/dL — ABNORMAL HIGH (ref 0.61–1.24)
GFR, Estimated: 27 mL/min — ABNORMAL LOW (ref >60)
Glucose, Bld: 185 mg/dL — ABNORMAL HIGH (ref 70–99)
Potassium: 3.7 mmol/L (ref 3.5–5.1)
Sodium: 138 mmol/L (ref 135–145)

## 2021-09-11 LAB — CULTURE, BLOOD (ROUTINE X 2)
Culture: NO GROWTH
Culture: NO GROWTH
Special Requests: ADEQUATE

## 2021-09-11 MED ORDER — METOPROLOL TARTRATE 25 MG PO TABS
25.0000 mg | ORAL_TABLET | Freq: Three times a day (TID) | ORAL | Status: DC
  Administered 2021-09-11 – 2021-09-14 (×8): 25 mg via ORAL
  Filled 2021-09-11 (×11): qty 1

## 2021-09-11 MED ORDER — VANCOMYCIN HCL 500 MG/100ML IV SOLN
500.0000 mg | Freq: Once | INTRAVENOUS | Status: AC
  Administered 2021-09-11: 500 mg via INTRAVENOUS
  Filled 2021-09-11: qty 100

## 2021-09-11 NOTE — Progress Notes (Addendum)
Progress Note   Subjective  Day #6 Chief Complaint: Episode of rectal bleeding/bloody stool, chronic anemia  Plavix on hold since admit  Hemoglobin 10--> 9.9 overnight, BUN/creatinine improving  Patient unable to answer any of his questions.  No further bleeding per nursing staff.   Objective   Vital signs in last 24 hours: Temp:  [98.1 F (36.7 C)-98.2 F (36.8 C)] 98.2 F (36.8 C) (08/21 0800) Pulse Rate:  [131-135] 135 (08/21 0800) Resp:  [15-18] 16 (08/21 0800) BP: (130-167)/(75-149) 145/89 (08/21 0800) SpO2:  [97 %-99 %] 97 % (08/21 0800) Weight:  [51.4 kg] 51.4 kg (08/21 0400) Last BM Date : 09/10/21 General:    AA male in NAD Heart:  Regular rate and rhythm; no murmurs Lungs: Respirations even and unlabored, lungs CTA bilaterally Abdomen:  Soft, nontender and nondistended. Normal bowel sounds. Psych:  Cooperative. Normal mood and affect.  Intake/Output from previous day: 08/20 0701 - 08/21 0700 In: 100 [IV Piggyback:100] Out: 300 [Urine:300] Intake/Output this shift: Total I/O In: 250 [P.O.:250] Out: -   Lab Results: Recent Labs    09/09/21 0555 09/09/21 1426 09/10/21 0546 09/10/21 0925 09/11/21 1007  WBC 16.3*  --  15.8*  --  21.0*  HGB 9.9*   < > 10.0* 10.0* 9.3*  HCT 29.0*   < > 29.0* 29.7* 27.4*  PLT 393  --  328  --  319   < > = values in this interval not displayed.   BMET Recent Labs    09/09/21 0555 09/10/21 0546 09/11/21 1007  NA 140 138 138  K 4.1 3.7 3.7  CL 106 109 107  CO2 24 19* 17*  GLUCOSE 55* 116* 185*  BUN 89* 68* 61*  CREATININE 3.23* 2.39* 2.46*  CALCIUM 9.2 9.0 8.9   LFT Recent Labs    09/08/21 1323  PROT 6.4*  ALBUMIN 2.4*  AST 22  ALT 13  ALKPHOS 87  BILITOT 0.8     Assessment / Plan:   Assessment: 1.  Hematochezia: 1 episode of dark bloody bowel movement 8/18, since then none. Hgb overall stable since 3 unit transfusion on admission, currently 9.3 2.  Dry gangrene right lower extremity 3.  Severe  peripheral vascular disease 4.  Dementia 5.  Congestive heart failure with EF 35-40% 6.  CKD stage III 7.  History of A-fib  Plan: 1.  Continue to recommend noncontrasted CT to assess for possibility of an ongoing mild colitis as etiology for episode of rectal bleeding 2.  Patient is high risk candidate for complications with any endoscopic evaluations, no plans for sigmoidoscopy or colonoscopy at this time 3.  Continue to monitor hemoglobin and transfusion as needed 4.  OK to resume Plavix from GI standpoint though I know this is likely on hold for upcoming amputation 5.  Pending results from CT we may sign off.   LOS: 5 days   Levin Erp  09/11/2021, 11:40 AM     Attending Physician Note   I have taken an interval history, reviewed the chart and examined the patient. I performed more than 50% of this encounter in conjunction with the APP. I agree with the APP's note, impression and recommendations with my edits. My additional impressions and recommendations are as follows.   *Hematochezia, one episode, resolved. No plans for flex sigmoidoscopy or colonoscopy due to his high risk for complications. Await CT AP results. Plavix can be resumed from a GI standpoint.   *ABL anemia. Hgb=9.3, appears stable.  Trend CBC.  Lucio Edward, MD [facility] See AMION, [facility], for our on call provider

## 2021-09-11 NOTE — Progress Notes (Signed)
Progress Note   Patient: Allen Yu LZJ:[id number] DOB: 05-28-1945 DOA: 09/06/2021     5 DOS: the patient was seen and examined on 09/11/2021   Brief hospital course: [name] is a 76 year old gentleman with a history of dementia, peripheral vascular disease with previous transmetatarsal amputation, urinary retention requiring chronic Foley catheter, diabetes, ischemic cardiomyopathy, ongoing tobacco abuse, paroxysmal atrial fibrillation who presented with several days of failure to thrive.  On further evaluation he was found to have sepsis secondary to dry gangrene and also complicated by acute kidney injury and metabolic encephalopathy.    Subjective Patient appeared calm. No new complaints.   Assessment and Plan:  Sepsis due to dry leg gangrene PVD Currently afebrile, with leukocytosis, tachycardic BC x2, NGTD Procalcitonin 0.41 Appreciate vascular surgery's management, plan for transtibial amputation once stabilized Continue vancomycin, cefepime, stop clindamycin due to risk of C.diff, start flagyl GI on board, ok to restart plavix, defer to vascular team  Acute metabolic encephalopathy Improving Patient with underlying history of dementia, states to be agitated, hitting and cursing at staff Delirium precautions Soft restraints implemented as needed  Acute kidney injury on CKD stage IIIb  Hyperphosphatemia Creatinine baseline around 1.7-2 Creatinine improving s/p IV fluids  S/P IV fluids Hold home torsemide, losartan Renally dose meds and avoid nephrotoxic medications Daily BMP  ?UTI Chronic urinary retention with chronic Foley catheter Urine culture growing Pseudomonas aeruginosa, E faecalis (as noted in the past), possible colonization Recently changed Foley Antibiotics as above Monitor closely  Acute on chronic anemia of CKD Possible GIB- noted to have large bloody stool on 09/08/2021 FOBT positive Status posttransfusion of 3 units PRBCs, transfuse if  hemoglobin drifts down to 7 GI consulted, appreciate recs, will follow and monitor for possible scope if significant drop in hemoglobin with bleeding CT abdomen/pelvis ordered by GI to rule out colitis Continue PPI Frequent CBC checks  Paroxysmal A-fib Now in atrial flutter Heart rate uncontrolled EKG showing A-flutter Cardiology consulted, appreciate recs Start Lopressor Not on any anticoagulation, currently possibly having GI bleed  Telemetry  Chronic systolic and diastolic HF Volume status now stable after IV fluids Last echo done 6/23 showed EF of 35 to 02%, grade 1 diastolic dysfunction Hold home torsemide for now Strict I's and O's, daily weights  Diabetes mellitus type 2 with hypoglycemia Likely 2/2 poor oral intake Last A1c 6 on 6/23 SSI, held Semglee, Accu-Cheks, hypoglycemic protocol   Baseline dementia, failure to thrive Malnutrition Continue Aricept, mirtazapine, Risperdal Delirium precautions Nutrition consulted, appreciate recs  Goals of care discussion Ongoing goals of care discussion has been initiated Patient with multiple comorbidities, failure to thrive, dementia, overall poor prognosis Palliative consult             Physical Exam: Vitals:   09/11/21 0000 09/11/21 0400 09/11/21 0800 09/11/21 1154  BP: (!) 146/87 138/79 (!) 145/89 118/85  Pulse:   (!) 135 (!) 133  Resp: '16 17 16 15  '$ Temp:   98.2 F (36.8 C) 97.8 F (36.6 C)  TempSrc:   Axillary Axillary  SpO2:   97% 96%  Weight:  51.4 kg    Height:        General: Chronically ill-appearing, very deconditioned, cachectic, HOH, awake, alert, oriented to self Cardiovascular: S1, S2 present Respiratory: CTAB Abdomen: Soft, nontender, nondistended, bowel sounds present Musculoskeletal: No bilateral lower extremity edema noted, left AKA with well-healed surgical stump, right TMA with staples in place, right anterior shin with ulceration Skin: As noted above Psychiatry: Unable  to  assess   Disposition: Status is: Inpatient Remains inpatient appropriate because: Septic with infected right foot and severe acute kidney injury.  Planned Discharge Destination:  To be determined      Author: Alma Friendly, MD 09/11/2021 6:15 PM  For on call review [email or web address].

## 2021-09-11 NOTE — Progress Notes (Signed)
Progress Note  Patient Name: Allen Yu Date of Encounter: 09/11/2021  [facility] Cardiologist: Rozann Lesches, MD   Subjective   HR remains elevated - 2:1 flutter with rates around 135. Metabolic profile pending.  Blood pressure may allow increase in beta blocker, however, flutter is often difficult to rate-control. Not a candidate for anticoagulation. Appreciate palliative care recs - may be reasonable not to proceed with amputation.  Inpatient Medications    Scheduled Meds:  (feeding supplement) PROSource Plus  30 mL Oral TID BM   ascorbic acid  500 mg Oral BID   atorvastatin  40 mg Oral QHS   Chlorhexidine Gluconate Cloth  6 each Topical Daily   donepezil  10 mg Oral QHS   feeding supplement  237 mL Oral TID BM   ferrous sulfate  325 mg Oral Q breakfast   insulin aspart  0-9 Units Subcutaneous Q4H   metoprolol tartrate  25 mg Oral BID   mirtazapine  7.5 mg Oral QHS   multivitamin with minerals  1 tablet Oral Daily   mouth rinse  15 mL Mouth Rinse 4 times per day   pantoprazole (PROTONIX) IV  40 mg Intravenous Q12H   vancomycin variable dose per unstable renal function (pharmacist dosing)   Does not apply See admin instructions   zinc sulfate  220 mg Oral Daily   Continuous Infusions:  ceFEPime (MAXIPIME) IV 1 g (09/11/21 0916)   metronidazole 500 mg (09/11/21 0951)   PRN Meds: acetaminophen, docusate sodium, mouth rinse, polyethylene glycol, risperiDONE   Vital Signs    Vitals:   09/10/21 2200 09/11/21 0000 09/11/21 0400 09/11/21 0800  BP: 130/75 (!) 146/87 138/79 (!) 145/89  Pulse:    (!) 135  Resp: '18 16 17 16  '$ Temp:    98.2 F (36.8 C)  TempSrc:    Axillary  SpO2:    97%  Weight:   51.4 kg   Height:        Intake/Output Summary (Last 24 hours) at 09/11/2021 1103 Last data filed at 09/11/2021 0916 Gross per 24 hour  Intake 350 ml  Output 300 ml  Net 50 ml      09/11/2021    4:00 AM 09/10/2021    4:10 AM 09/08/2021    1:17 AM  Last 3 Weights   Weight (lbs) 113 lb 5.1 oz 104 lb 11.5 oz 105 lb 2.6 oz  Weight (kg) 51.4 kg 47.5 kg 47.7 kg      Telemetry    AFL rate 135 - Personally Reviewed  ECG    NO new ECG - Personally Reviewed  Physical Exam   GEN: Chronically ill-appearing Neck: No JVD Cardiac: Echocardiac, regular no murmurs, rubs, or gallops.  Respiratory: Clear to auscultation bilaterally. GI: Soft, nontender, non-distended  MS: No edema Neuro: Oriented to person only Psych: Unable to assess  Labs    High Sensitivity Troponin:  No results for input(s): "TROPONINIHS" in the last 720 hours.   Chemistry Recent Labs  Lab 09/06/21 0929 09/06/21 1440 09/07/21 0500 09/08/21 1323 09/09/21 0555 09/10/21 0546  NA 134* 132* 134* 136 140 138  K 4.9 4.4 4.1 3.6 4.1 3.7  CL 96* 97* 99 105 106 109  CO2 20* 19* 20* 22 24 19*  GLUCOSE 175* 230* 151* 98 55* 116*  BUN 134* 148* 136* 104* 89* 68*  CREATININE 7.74* 7.23* 5.88* 3.94* 3.23* 2.39*  CALCIUM 9.0 8.3* 8.8* 8.7* 9.2 9.0  MG  --  1.1* 2.1  --   --   --  PROT 8.1  --   --  6.4*  --   --   ALBUMIN 3.1*  --   --  2.4*  --   --   AST 23  --   --  22  --   --   ALT 13  --   --  13  --   --   ALKPHOS 104  --   --  87  --   --   BILITOT 0.7  --   --  0.8  --   --   GFRNONAA 7* 7* 9* 15* 19* 28*  ANIONGAP 18* 16* '15 9 10 10    '$ Lipids No results for input(s): "CHOL", "TRIG", "HDL", "LABVLDL", "LDLCALC", "CHOLHDL" in the last 168 hours.  Hematology Recent Labs  Lab 09/09/21 0555 09/09/21 1426 09/10/21 0546 09/10/21 0925 09/11/21 1007  WBC 16.3*  --  15.8*  --  21.0*  RBC 3.18*  --  3.19*  --  2.94*  HGB 9.9*   < > 10.0* 10.0* 9.3*  HCT 29.0*   < > 29.0* 29.7* 27.4*  MCV 91.2  --  90.9  --  93.2  MCH 31.1  --  31.3  --  31.6  MCHC 34.1  --  34.5  --  33.9  RDW 14.4  --  14.5  --  14.7  PLT 393  --  328  --  319   < > = values in this interval not displayed.   Thyroid No results for input(s): "TSH", "FREET4" in the last 168 hours.  BNP Recent Labs   Lab 09/06/21 1440  BNP 176.9*    DDimer No results for input(s): "DDIMER" in the last 168 hours.   Radiology    No results found.  Cardiac Studies   N/A  Patient Profile     76 y.o. 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  Appears 2:1 atrial flutter. -Not candidate for anticoagulation currently in setting of GI bleed -Would favor holding off on amiodarone given risk of chemical cardioversion and patient has not been on anticoagulation.  Would need to consider amiodarone though if hypotensive and no other options for rate control -BP improved today, he is hypertensive.  Will increase metoprolol to 25 mg TID.  Atrial flutter is difficult to rate control.  Suspect will ultimately need cardioverted once he recovers from acute illness and bleeding resolved and able to be on anticoagulation  Sepsis: Due to gangrene of right lower extremity.  Planning for amputation once medically optimized  Chronic systolic heart failure: EF 35 to 40%.  Holding home torsemide, losartan in setting of AKI.  He received gentle hydration with improvement in renal function.  Restart metoprolol as above.  AKI: Creatinine baseline 1.7-2.  Creatinine up to 7.7 on admission.  Improved with IV fluids - BMET pending.  GI bleeding: Large bloody stool on 8/18.  Status post 3 units PRBCs.  Plavix held.  GI following, planning CT abdomen, holding off on endoscopy at this time  Goals of care: Agree with palliative recs - would strongly consider comfort. Not sure that amputation would necessarily improve QOL.   For questions  or updates, please contact [place] Please consult [email or web address] for contact info under    Pixie Casino, MD, FACC, [place] Director of the [facility] Diplomate of the American Board of Clinical Lipidology Attending Cardiologist  Direct Dial: [phone]  Fax: [phone]  Website:  [email or web address]  Pixie Casino, MD  09/11/2021, 11:03 AM

## 2021-09-11 NOTE — Progress Notes (Signed)
Pharmacy Antibiotic Note  Allen Yu is a 76 y.o. male admitted on 09/06/2021 with sepsis due to dry leg gangrene. Pharmacy has been consulted for vancomycin dosing.   Vanc Random 23 mcg/mL (18hr level). Scr remains elevated from baseline, but stable over the last 24-48 hours.  Plan: Vancomycin '500mg'$  x 1 dose Check 24hr random on 8/22 '@1600'$  Cefepime 1 g IV every 24 hours F/u renal fxn, clinical status and levels as needed  Height: '6\' 1"'$  (185.4 cm) Weight: 51.4 kg (113 lb 5.1 oz) IBW/kg (Calculated) : 79.9  Temp (24hrs), Avg:98.1 F (36.7 C), Min:97.8 F (36.6 C), Max:98.2 F (36.8 C)  Recent Labs  Lab 09/06/21 0929 09/06/21 1440 09/07/21 0500 09/08/21 0608 09/08/21 1323 09/09/21 0555 09/09/21 1142 09/10/21 0546 09/10/21 1020 09/11/21 1007  WBC 17.3*   < > 15.6* 15.4* 13.9* 16.3*  --  15.8*  --  21.0*  CREATININE 7.74*   < > 5.88*  --  3.94* 3.23*  --  2.39*  --  2.46*  LATICACIDVEN 1.2  --   --   --   --   --   --   --   --   --   VANCORANDOM  --    < > 13 17  --  21   < >  --  21 23   < > = values in this interval not displayed.     Estimated Creatinine Clearance: 18.9 mL/min (A) (by C-G formula based on SCr of 2.46 mg/dL (H)).     Antimicrobials this admission: Vanco 8/16 >> Cefepime 8/16>> Clindamycin 8/16>> 8/19  Microbiology results: 8/16 BCx: NGTD 8/16 UCx: >100k pseudomonas/enterococcus faecalis   Erskine Speed, PharmD 09/11/2021  12:41 PM   Please check AMION for all [place] phone numbers After 10:00 PM, call [place] [phone]

## 2021-09-11 NOTE — Progress Notes (Addendum)
  Progress Note    09/11/2021 7:32 AM * No surgery found *  Subjective:  confused and agitated this morning.  R leg contracted   Vitals:   09/11/21 0000 09/11/21 0400  BP: (!) 146/87 138/79  Pulse:    Resp: 16 17  Temp:    SpO2:     Physical Exam: Lungs:  non labored Extremities:  dressing left in place R leg Neurologic: A&O  CBC    Component Value Date/Time   WBC 15.8 (H) 09/10/2021 0546   RBC 3.19 (L) 09/10/2021 0546   HGB 10.0 (L) 09/10/2021 0925   HCT 29.7 (L) 09/10/2021 0925   HCT 31.3 (L) 07/09/2015 0757   PLT 328 09/10/2021 0546   MCV 90.9 09/10/2021 0546   MCH 31.3 09/10/2021 0546   MCHC 34.5 09/10/2021 0546   RDW 14.5 09/10/2021 0546   LYMPHSABS 0.8 09/10/2021 0546   MONOABS 1.0 09/10/2021 0546   EOSABS 0.1 09/10/2021 0546   BASOSABS 0.0 09/10/2021 0546    BMET    Component Value Date/Time   NA 138 09/10/2021 0546   K 3.7 09/10/2021 0546   CL 109 09/10/2021 0546   CO2 19 (L) 09/10/2021 0546   GLUCOSE 116 (H) 09/10/2021 0546   BUN 68 (H) 09/10/2021 0546   CREATININE 2.39 (H) 09/10/2021 0546   CREATININE 1.10 11/26/2019 0934   CALCIUM 9.0 09/10/2021 0546   GFRNONAA 28 (L) 09/10/2021 0546   GFRAA >60 07/10/2019 0702    INR    Component Value Date/Time   INR 1.3 (H) 09/06/2021 0929     Intake/Output Summary (Last 24 hours) at 09/11/2021 0732 Last data filed at 09/11/2021 0500 Gross per 24 hour  Intake 100 ml  Output 300 ml  Net -200 ml     Assessment/Plan:  76 y.o. male with gangrenous R leg  Ongoing discussions with family and palliative care team.  This morning patient is confused and agitated requiring constraints.  His R hip is in full flexion.  Patient will need R AKA however given his current clinical state, he may be best served with comfort care unless there is drastic improvement.  Vascular will continue to follow.   Dagoberto Ligas, PA-C [facility] [phone] 09/11/2021 7:32 AM  VASCULAR STAFF  ADDENDUM: I have independently interviewed and examined the patient. I agree with the above.  Pending ongoing discussion with palliative care physicians. I am available to perform an above-knee amputation, should the patient and his family desire it. Please call if an above-knee amputation is requested.  [name]. Stanford Breed, MD [facility] Phone Number: [phone] 09/11/2021 11:02 AM

## 2021-09-12 DIAGNOSIS — F0390 Unspecified dementia without behavioral disturbance: Secondary | ICD-10-CM | POA: Diagnosis not present

## 2021-09-12 DIAGNOSIS — R933 Abnormal findings on diagnostic imaging of other parts of digestive tract: Secondary | ICD-10-CM | POA: Diagnosis not present

## 2021-09-12 DIAGNOSIS — N179 Acute kidney failure, unspecified: Secondary | ICD-10-CM | POA: Diagnosis not present

## 2021-09-12 DIAGNOSIS — I5042 Chronic combined systolic (congestive) and diastolic (congestive) heart failure: Secondary | ICD-10-CM | POA: Diagnosis not present

## 2021-09-12 DIAGNOSIS — I739 Peripheral vascular disease, unspecified: Secondary | ICD-10-CM | POA: Diagnosis not present

## 2021-09-12 DIAGNOSIS — I484 Atypical atrial flutter: Secondary | ICD-10-CM | POA: Diagnosis not present

## 2021-09-12 DIAGNOSIS — K921 Melena: Secondary | ICD-10-CM | POA: Diagnosis not present

## 2021-09-12 DIAGNOSIS — A419 Sepsis, unspecified organism: Secondary | ICD-10-CM | POA: Diagnosis not present

## 2021-09-12 DIAGNOSIS — K922 Gastrointestinal hemorrhage, unspecified: Secondary | ICD-10-CM | POA: Diagnosis not present

## 2021-09-12 LAB — CBC WITH DIFFERENTIAL/PLATELET
Abs Immature Granulocytes: 0.12 10*3/uL — ABNORMAL HIGH (ref 0.00–0.07)
Basophils Absolute: 0.1 10*3/uL (ref 0.0–0.1)
Basophils Relative: 0 %
Eosinophils Absolute: 0.1 10*3/uL (ref 0.0–0.5)
Eosinophils Relative: 0 %
HCT: 28.1 % — ABNORMAL LOW (ref 39.0–52.0)
Hemoglobin: 9.3 g/dL — ABNORMAL LOW (ref 13.0–17.0)
Immature Granulocytes: 1 %
Lymphocytes Relative: 4 %
Lymphs Abs: 0.9 10*3/uL (ref 0.7–4.0)
MCH: 31.3 pg (ref 26.0–34.0)
MCHC: 33.1 g/dL (ref 30.0–36.0)
MCV: 94.6 fL (ref 80.0–100.0)
Monocytes Absolute: 1.6 10*3/uL — ABNORMAL HIGH (ref 0.1–1.0)
Monocytes Relative: 8 %
Neutro Abs: 17.8 10*3/uL — ABNORMAL HIGH (ref 1.7–7.7)
Neutrophils Relative %: 87 %
Platelets: 285 10*3/uL (ref 150–400)
RBC: 2.97 MIL/uL — ABNORMAL LOW (ref 4.22–5.81)
RDW: 14.9 % (ref 11.5–15.5)
WBC: 20.5 10*3/uL — ABNORMAL HIGH (ref 4.0–10.5)
nRBC: 0 % (ref 0.0–0.2)

## 2021-09-12 LAB — BASIC METABOLIC PANEL
Anion gap: 13 (ref 5–15)
BUN: 52 mg/dL — ABNORMAL HIGH (ref 8–23)
CO2: 17 mmol/L — ABNORMAL LOW (ref 22–32)
Calcium: 8.8 mg/dL — ABNORMAL LOW (ref 8.9–10.3)
Chloride: 110 mmol/L (ref 98–111)
Creatinine, Ser: 2.3 mg/dL — ABNORMAL HIGH (ref 0.61–1.24)
GFR, Estimated: 29 mL/min — ABNORMAL LOW (ref >60)
Glucose, Bld: 189 mg/dL — ABNORMAL HIGH (ref 70–99)
Potassium: 3.4 mmol/L — ABNORMAL LOW (ref 3.5–5.1)
Sodium: 140 mmol/L (ref 135–145)

## 2021-09-12 LAB — GLUCOSE, CAPILLARY
Glucose-Capillary: 223 mg/dL — ABNORMAL HIGH (ref 70–99)
Glucose-Capillary: 335 mg/dL — ABNORMAL HIGH (ref 70–99)
Glucose-Capillary: 320 mg/dL — ABNORMAL HIGH (ref 70–99)

## 2021-09-12 MED ORDER — INSULIN GLARGINE-YFGN 100 UNIT/ML ~~LOC~~ SOLN
5.0000 [IU] | Freq: Every day | SUBCUTANEOUS | Status: DC
Start: 2021-09-12 — End: 2021-09-13
  Administered 2021-09-12 – 2021-09-13 (×2): 5 [IU] via SUBCUTANEOUS
  Filled 2021-09-12 (×2): qty 0.05

## 2021-09-12 MED ORDER — CLOPIDOGREL BISULFATE 75 MG PO TABS
75.0000 mg | ORAL_TABLET | Freq: Every day | ORAL | Status: DC
Start: 2021-09-13 — End: 2021-09-15
  Administered 2021-09-14 – 2021-09-15 (×2): 75 mg via ORAL
  Filled 2021-09-12 (×3): qty 1

## 2021-09-12 MED ORDER — CEFTRIAXONE SODIUM 2 G IJ SOLR
2.0000 g | INTRAMUSCULAR | Status: DC
  Administered 2021-09-13 – 2021-09-14 (×2): 2 g via INTRAVENOUS
  Filled 2021-09-12 (×3): qty 20

## 2021-09-12 NOTE — Progress Notes (Signed)
Palliative Medicine Progress Note   Patient Name: Allen Yu       Date: 09/12/2021 DOB: 1945-10-14  Age: 76 y.o. MRN#: [id number] Attending Physician: Alma Friendly, MD Primary Care Physician: [name], MD Admit Date: 09/06/2021   HPI/Patient Profile: 76 y.o. 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  He presented to [facility] ED on 09/06/2021 with failure to thrive for several days.  His wife reported he was sleeping a lot and not eating very much.  In the ED he was found to have creatinine of 7.74 and hemoglobin of 5.6. He was admitted to [facility] with sepsis secondary to dry leg gangrene, along with severe AKI and acute encephalopathy.  Subjective: Chart reviewed.  Note that per cardiology, patient remains in a flutter with rates unchanged despite increase in medication.  I went to see patient at bedside.  He is curled up on his side and completely covered underneath blankets.  He awakens when I attempt to remove the blankets from his face.  He ask "what do you want?"  and then tells me he does not want anything to eat and wants to go back to sleep.  I spoke with his wife Allen Yu by phone. We reviewed Allen Yu's current clinical condition. I expressed concern that his heart rate continues to be uncontrolled and that he is too fragile/unstable for surgery. We discussed that Allen Yu seems to not want full scope medical interventions. Allen Yu tells me that Allen Yu recently stated he "wants to come home and die". Allen Yu wants Allen Yu to be "comfortable", but wants to continue current interventions for now. We  discussed getting Allen Yu home with hospice services - Allen Yu is receptive to this and wants [place] to reach out to his [place] social worker about options through the [place] .    Objective:  Physical Exam Vitals reviewed.  Constitutional:      Appearance: He is ill-appearing.  Cardiovascular:     Rate and Rhythm: Tachycardia present.  Pulmonary:     Effort: Pulmonary effort is normal.  Musculoskeletal:     Left Lower Extremity: Left leg is amputated above knee.  Psychiatric:  Behavior: Behavior is withdrawn.        Cognition and Memory: Cognition is impaired.             Vital Signs: BP (!) 146/92 (BP Location: Left Arm)   Pulse 71   Temp 97.9 F (36.6 C) (Oral)   Resp 16   Ht '6\' 1"'$  (1.854 m)   Wt 51.4 kg   SpO2 92%   BMI 14.95 kg/m  SpO2: SpO2: 92 % O2 Device: O2 Device: Room Air O2 Flow Rate: O2 Flow Rate (L/min):  (0)    LBM: Last BM Date : 09/10/21     Palliative Assessment/Data: PPS 20%     Palliative Medicine Assessment & Plan   Assessment: Principal Problem:   Sepsis (Allen Yu) Active Problems:   AKI (acute kidney injury) (Allen Yu)   Pressure injury of skin   Melena   Abnormal CT scan, gastrointestinal tract   Atypical atrial flutter (Allen Yu)   Gastrointestinal hemorrhage    Recommendations/Plan: DNR/DNI as previously documented Continue current interventions for now Wife is considering home with hospice PMT will follow up tomorrow   Prognosis:  < 6 months  Discharge Planning: To Be Determined  Care plan was discussed with Dr. Horris Latino  Thank you for allowing the Palliative Medicine Team to assist in the care of this patient.   MDM - High   Lavena Bullion, NP   Please contact Palliative Medicine Team phone at [phone] for questions and concerns.  For individual providers, please see AMION.

## 2021-09-12 NOTE — Progress Notes (Addendum)
Progress Note   Patient: Allen Yu JTT:[id number] DOB: 01-Aug-1945 DOA: 09/06/2021     6 DOS: the patient was seen and examined on 09/12/2021   Brief hospital course: Allen Yu is a 76 year old gentleman with a history of dementia, peripheral vascular disease with previous transmetatarsal amputation, urinary retention requiring chronic Foley catheter, diabetes, ischemic cardiomyopathy, ongoing tobacco abuse, paroxysmal atrial fibrillation who presented with several days of failure to thrive.  On further evaluation he was found to have sepsis secondary to dry gangrene and also complicated by acute kidney injury and metabolic encephalopathy.    Subjective Today, pt not wanting to talk, has sheets over his face.  Discussed with wife in detail over the phone about overall medical condition and plan.  Further goals of care conversation to be held with palliative pending hospital course   Assessment and Plan:  Sepsis due to dry leg gangrene PVD Currently afebrile, with leukocytosis, tachycardic BC x2, NGTD Procalcitonin 0.41 Appreciate vascular surgery input, transtibial amputation currently on hold pending further GOC discussion S/p vancomycin, cefepime, flagyl X 7 days, currently on ceftriaxone only for now GI on board, ok to restart plavix  Acute metabolic encephalopathy Patient with underlying history of dementia, states to be agitated, hitting and cursing at staff Delirium precautions Soft restraints implemented as needed  Acute kidney injury on CKD stage IIIb  Hyperphosphatemia Creatinine baseline around 1.7-2 Creatinine improving s/p IV fluids  S/P IV fluids Hold home torsemide, losartan Renally dose meds and avoid nephrotoxic medications Daily BMP  ?UTI Chronic urinary retention with chronic Foley catheter Urine culture growing Pseudomonas aeruginosa, E faecalis (as noted in the past), possible colonization Recently changed Foley Antibiotics as above  Acute on  chronic anemia of CKD Possible GIB- noted to have large bloody stool on 09/08/2021 FOBT positive Status posttransfusion of 3 units PRBCs, transfuse if hemoglobin drifts down to 7 GI consulted, appreciate recs, will follow and monitor for possible scope if significant drop in hemoglobin with bleeding CT abdomen/pelvis ordered by GI, patient unable to cooperate Continue PPI Daily CBC  Paroxysmal A-fib Now in atrial flutter Heart rate uncontrolled EKG showing A-flutter Cardiology consulted, appreciate recs, increase lopressor Not on any anticoagulation, due to recent GI bleed   Chronic systolic and diastolic HF Volume status now stable after IV fluids Last echo done 6/23 showed EF of 35 to 00%, grade 1 diastolic dysfunction Hold home torsemide for now Strict I's and O's, daily weights  Diabetes mellitus type 2  Now with hyperglycemia Last A1c 6 on 6/23 SSI, start Semglee at 5 units daily, Accu-Cheks, hypoglycemic protocol   Baseline dementia, failure to thrive Malnutrition Continue Aricept, mirtazapine, Risperdal Delirium precautions Nutrition consulted, appreciate recs  Goals of care discussion Ongoing goals of care discussion has been initiated Patient with multiple comorbidities, failure to thrive, dementia, overall poor prognosis Palliative consult             Physical Exam: Vitals:   09/11/21 1930 09/11/21 2330 09/12/21 0400 09/12/21 0752  BP: 133/82 116/81 (!) 134/90 (!) 146/92  Pulse:   (!) 130 71  Resp: '17 15 18 16  '$ Temp: 98.2 F (36.8 C)  98.2 F (36.8 C) 97.9 F (36.6 C)  TempSrc: Oral  Oral Oral  SpO2: 95% 100% 98% 92%  Weight:      Height:        General: Chronically ill-appearing, very deconditioned, cachectic, HOH, awake, alert, oriented to self Cardiovascular: S1, S2 present Respiratory: CTAB Abdomen: Soft, nontender, nondistended, bowel sounds  present Musculoskeletal: No bilateral lower extremity edema noted, left AKA with well-healed  surgical stump, right TMA with staples in place, right anterior shin with ulceration Skin: As noted above Psychiatry: Unable to assess   Disposition: Status is: Inpatient Remains inpatient appropriate because: Septic with infected right foot and severe acute kidney injury.  Planned Discharge Destination:  To be determined      Author: Alma Friendly, MD 09/12/2021 3:35 PM  For on call review [email or web address].

## 2021-09-12 NOTE — Progress Notes (Addendum)
    Progress Note   Subjective  Day #7 Chief Complaint: Episode of rectal bleeding/bloody stool, chronic anemia  Per nursing staff patient has not had any further rectal bleeding.  He is uncooperative today with the covers over his head and not wanting to talk to me.  Per nursing staff they are not sure that he would lay still for CT and in fact they are not sure they can even get him down to the CT. No diarrhea.   Objective   Vital signs in last 24 hours: Temp:  [97.8 F (36.6 C)-98.2 F (36.8 C)] 97.9 F (36.6 C) (08/22 0752) Pulse Rate:  [71-133] 71 (08/22 0752) Resp:  [15-18] 16 (08/22 0752) BP: (116-146)/(81-92) 146/92 (08/22 0752) SpO2:  [92 %-100 %] 92 % (08/22 0752) Last BM Date : 09/10/21 General:    AA male in NAD Heart:  Regular rate and rhythm; no murmurs Lungs: Respirations even and unlabored, lungs CTA bilaterally Abdomen:  Soft, nontender and nondistended. Normal bowel sounds. Psych:  Uncooperative  Intake/Output from previous day: 08/21 0701 - 08/22 0700 In: 590 [P.O.:590] Out: 1800 [Urine:1800]  Lab Results: Recent Labs    09/10/21 0546 09/10/21 0925 09/11/21 1007 09/12/21 0724  WBC 15.8*  --  21.0* 20.5*  HGB 10.0* 10.0* 9.3* 9.3*  HCT 29.0* 29.7* 27.4* 28.1*  PLT 328  --  319 285   BMET Recent Labs    09/10/21 0546 09/11/21 1007 09/12/21 0724  NA 138 138 140  K 3.7 3.7 3.4*  CL 109 107 110  CO2 19* 17* 17*  GLUCOSE 116* 185* 189*  BUN 68* 61* 52*  CREATININE 2.39* 2.46* 2.30*  CALCIUM 9.0 8.9 8.8*     Assessment / Plan:   Assessment: Hematochezia: 1 episode of a dark bloody bowel movement 8/18, since then none, hemoglobin overall stable since 3 unit transfusion on admission, currently 9.3, initially plans were for CTAP given distant history of similar findings on previous CT, but patient is unable to cooperate with this Dry gray-green right lower extremity Severe peripheral vascular disease Dementia Congestive heart failure with EF  35 to 40% CKD stage III History of A-fib  Plan: Patient is uncooperative and unable to perform CT A/P as ordered, staff has been trying to get him down to CT for the past 2 days and he is unable to proceed.  With no further episodes of hematochezia and stable hemoglobin I am going to cancel this procedure. Continue to monitor Hgb and for any further bleeding. Please call us back with any other questions.   We will sign off.    LOS: 6 days   Levin Erp  09/12/2021, 11:51 AM   Attending Physician Note   I have taken an interval history, reviewed the chart and examined the patient. I performed more than 50% of this encounter in conjunction with the APP. I agree with the APP's note, impression and recommendations with my edits. My additional impressions and recommendations are as follows.   Hematochezia, resolved. Hgb stable at 9.3. Pt unable to cooperate with CT so we will cancel the CT. No additional GI evaluation at this time. GI signing off. Outpatient GI follow up prn with Dr. Gala Romney.   Lucio Edward, MD [facility] See AMION, [facility], for our on call provider

## 2021-09-12 NOTE — Progress Notes (Signed)
    Remains in atrial flutter - rates unchanged, despite increase in medication. Per GI, ok to resume Plavix. Too high risk for bleeding with DOAC. Not a candidate for AAD meds due to risk of conversion and not a cardioversion candidate. Flutter is likely to worsen his systolic cardiomyopathy. Creatinine continues to trend downward. BP may allow further increase in metoprolol - would avoid CCB given CHF. No further suggestions at this time. Prognosis remains poor.  Pixie Casino, MD, [facility], [place] Director of the Advanced Lipid Disorders &  Cardiovascular Risk Reduction Clinic Diplomate of the American Board of Clinical Lipidology Attending Cardiologist  Direct Dial: [phone]  Fax: [phone]  Website:  [email or web address]

## 2021-09-13 ENCOUNTER — Other Ambulatory Visit: Payer: Self-pay | Admitting: *Deleted

## 2021-09-13 DIAGNOSIS — R627 Adult failure to thrive: Secondary | ICD-10-CM

## 2021-09-13 DIAGNOSIS — D649 Anemia, unspecified: Secondary | ICD-10-CM | POA: Diagnosis not present

## 2021-09-13 DIAGNOSIS — R6520 Severe sepsis without septic shock: Secondary | ICD-10-CM | POA: Diagnosis not present

## 2021-09-13 DIAGNOSIS — I484 Atypical atrial flutter: Secondary | ICD-10-CM | POA: Diagnosis not present

## 2021-09-13 DIAGNOSIS — A419 Sepsis, unspecified organism: Secondary | ICD-10-CM | POA: Diagnosis not present

## 2021-09-13 DIAGNOSIS — N179 Acute kidney failure, unspecified: Secondary | ICD-10-CM | POA: Diagnosis not present

## 2021-09-13 LAB — CBC WITH DIFFERENTIAL/PLATELET
Abs Immature Granulocytes: 0.12 10*3/uL — ABNORMAL HIGH (ref 0.00–0.07)
Basophils Absolute: 0 10*3/uL (ref 0.0–0.1)
Basophils Relative: 0 %
Eosinophils Absolute: 0 10*3/uL (ref 0.0–0.5)
Eosinophils Relative: 0 %
HCT: 27.3 % — ABNORMAL LOW (ref 39.0–52.0)
Hemoglobin: 9.2 g/dL — ABNORMAL LOW (ref 13.0–17.0)
Immature Granulocytes: 1 %
Lymphocytes Relative: 5 %
Lymphs Abs: 0.9 10*3/uL (ref 0.7–4.0)
MCH: 31.2 pg (ref 26.0–34.0)
MCHC: 33.7 g/dL (ref 30.0–36.0)
MCV: 92.5 fL (ref 80.0–100.0)
Monocytes Absolute: 1.6 10*3/uL — ABNORMAL HIGH (ref 0.1–1.0)
Monocytes Relative: 9 %
Neutro Abs: 15.2 10*3/uL — ABNORMAL HIGH (ref 1.7–7.7)
Neutrophils Relative %: 85 %
Platelets: 246 10*3/uL (ref 150–400)
RBC: 2.95 MIL/uL — ABNORMAL LOW (ref 4.22–5.81)
RDW: 14.8 % (ref 11.5–15.5)
WBC: 17.9 10*3/uL — ABNORMAL HIGH (ref 4.0–10.5)
nRBC: 0 % (ref 0.0–0.2)

## 2021-09-13 LAB — BASIC METABOLIC PANEL
Anion gap: 8 (ref 5–15)
BUN: 40 mg/dL — ABNORMAL HIGH (ref 8–23)
CO2: 18 mmol/L — ABNORMAL LOW (ref 22–32)
Calcium: 8.7 mg/dL — ABNORMAL LOW (ref 8.9–10.3)
Chloride: 113 mmol/L — ABNORMAL HIGH (ref 98–111)
Creatinine, Ser: 2.02 mg/dL — ABNORMAL HIGH (ref 0.61–1.24)
GFR, Estimated: 34 mL/min — ABNORMAL LOW (ref >60)
Glucose, Bld: 89 mg/dL (ref 70–99)
Potassium: 3.1 mmol/L — ABNORMAL LOW (ref 3.5–5.1)
Sodium: 139 mmol/L (ref 135–145)

## 2021-09-13 LAB — GLUCOSE, CAPILLARY
Glucose-Capillary: 126 mg/dL — ABNORMAL HIGH (ref 70–99)
Glucose-Capillary: 137 mg/dL — ABNORMAL HIGH (ref 70–99)
Glucose-Capillary: 181 mg/dL — ABNORMAL HIGH (ref 70–99)
Glucose-Capillary: 53 mg/dL — ABNORMAL LOW (ref 70–99)
Glucose-Capillary: 75 mg/dL (ref 70–99)
Glucose-Capillary: 90 mg/dL (ref 70–99)
Glucose-Capillary: 90 mg/dL (ref 70–99)

## 2021-09-13 MED ORDER — LORAZEPAM 2 MG/ML IJ SOLN: 0.5000 mg | INTRAMUSCULAR | Status: DC | PRN

## 2021-09-13 MED ORDER — DEXTROSE 50 % IV SOLN: 25.0000 g | INTRAVENOUS | Status: AC

## 2021-09-13 MED ORDER — DEXTROSE 50 % IV SOLN
INTRAVENOUS | Status: AC
  Administered 2021-09-13: 50 mL
  Filled 2021-09-13: qty 50

## 2021-09-13 NOTE — Progress Notes (Signed)
   09/13/21 1935  Assess: MEWS Score  Temp 98.4 F (36.9 C)  BP (!) 131/98  MAP (mmHg) 110  ECG Heart Rate (!) 131  Resp 12  SpO2 97 %  O2 Device Room Air  Assess: MEWS Score  MEWS Temp 0  MEWS Systolic 0  MEWS Pulse 3  MEWS RR 1  MEWS LOC 0  MEWS Score 4  MEWS Score Color Red  Assess: if the MEWS score is Yellow or Red  Were vital signs taken at a resting state? Yes  Focused Assessment No change from prior assessment  Does the patient meet 2 or more of the SIRS criteria? Yes  Does the patient have a confirmed or suspected source of infection? Yes  Provider and Rapid Response Notified? No  MEWS guidelines implemented *See Row Information* No, vital signs rechecked  Treat  Pain Scale Faces  Pain Score 0  Take Vital Signs  Increase Vital Sign Frequency  Red: Q 1hr X 4 then Q 4hr X 4, if remains red, continue Q 4hrs  Escalate  MEWS: Escalate Red: discuss with charge nurse/RN and provider, consider discussing with RRT  Notify: Charge Nurse/RN  Name of Charge Nurse/RN Notified steph  Date Charge Nurse/RN Notified 09/13/21  Time Charge Nurse/RN Notified 2000  Assess: SIRS CRITERIA  SIRS Temperature  0  SIRS Pulse 1  SIRS Respirations  0  SIRS WBC 1  SIRS Score Sum  2

## 2021-09-13 NOTE — Patient Instructions (Addendum)
Visit Information  Thank you for taking time to visit with me today. Please don't hesitate to contact me if I can be of assistance to you.   Following are the goals we discussed today:   Goals Addressed               This Visit's Progress     Patient Stated     COMPLETED: Level of care management/support (pt-stated)        Care Coordination Interventions: Provided patient and/or caregiver with quality information about home health, palliative care and hospice services in [facility]. Questions answered about level of care services, increase heart rates related to atrial fibrillation and infection, changes in behavior related to mental and memory conditions (community resource) Screening for signs and symptoms of depression related to chronic disease state  Assessed social determinant of health barriers Caregiver concerns acknowledge, support offered, Questions answered   Online education for palliative care (video), hospice, end of life care and death and dying sent via my chart         Our next appointment is  as needed  on ? at ?  Please call the care guide team at [phone] if you need to cancel or reschedule your appointment.   If you are experiencing a Mental Health or [place] or need someone to talk to, please call the Suicide and Crisis Lifeline: 988 call the [place] National Suicide Prevention Lifeline: 302-767-7797 or TTY: 954-494-5197 TTY 314-632-8374) to talk to a trained counselor call 1-800-273-TALK (toll free, 24 hour hotline) call the [facility]: (732) 437-7648 call 911   Patient verbalizes understanding of instructions and care plan provided today and agrees to view in [place]. Active MyChart status and patient understanding of how to access instructions and care plan via MyChart confirmed with patient.     The patient has been provided with contact information for the care management team and has been advised to call  with any health related questions or concerns.   [place] [name], RN, BSN, [place] Coordinator Office number [phone]

## 2021-09-13 NOTE — Progress Notes (Signed)
Palliative Medicine Progress Note   Patient Name: Allen Yu       Date: 09/13/2021 DOB: 11/03/1945  Age: 76 y.o. MRN#: [id number] Attending Physician: Elmarie Shiley, MD Primary Care Physician: [name], MD Admit Date: 09/06/2021  Reason for Consultation/Follow-up: {Reason for Consult:23484}  HPI/Patient Profile: 76 y.o. 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  He presented to [facility] ED on 09/06/2021 with failure to thrive for several days.  His wife reported he was sleeping a lot and not eating very much.  In the ED he was found to have creatinine of 7.74 and hemoglobin of 5.6. He was admitted to [facility] with sepsis secondary to dry leg gangrene, along with severe AKI and acute encephalopathy.  Subjective: Chart reviewed, update received from RN, and patient assessed at bedside. He has been agitated,  refusing medications and labs, and refusing to eat.   I spoke with wife/[name] by phone and expressed concern regarding patient's current clinical status.    Objective:  Physical Exam Vitals reviewed.  Constitutional:      General: He is not in acute distress.    Appearance: He is ill-appearing.  Cardiovascular:     Rate and Rhythm: Tachycardia present.  Pulmonary:     Effort: Pulmonary effort is normal.  Musculoskeletal:     Left Lower Extremity: Left leg is amputated above knee.  Neurological:     Mental Status: He is alert.  Psychiatric:        Cognition and Memory: Cognition is impaired.             Vital Signs: BP 125/81   Pulse (!) 121   Temp 98.4 F (36.9 C) (Oral)    Resp 20   Ht '6\' 1"'$  (1.854 m)   Wt 51.4 kg   SpO2 97%   BMI 14.95 kg/m  SpO2: SpO2: 97 % O2 Device: O2 Device: Room Air O2 Flow Rate: O2 Flow Rate (L/min):  (0)    LBM: Last BM Date : 09/13/21     Palliative Assessment/Data: ***     Palliative Medicine Assessment & Plan   Assessment: Principal Problem:  Sepsis ([place]) Active Problems:   AKI (acute kidney injury) ([place])   Pressure injury of skin   Melena   Abnormal CT scan, gastrointestinal tract   Atypical atrial flutter ([facility])   Gastrointestinal hemorrhage    Recommendations/Plan: DNR/DNI as previously documented Continue current interventions for now Wife agrees to home with hospice Ativan 0.5-1 mg IV every 4 hours as needed for anxiety or agitation PMT will continue to follow   Prognosis:  < 6 months  Discharge Planning: Home with Hospice  Care plan was discussed with ***  Thank you for allowing the Palliative Medicine Team to assist in the care of this patient.   ***   Lavena Bullion, NP   Please contact Palliative Medicine Team phone at [phone] for questions and concerns.  For individual providers, please see AMION.

## 2021-09-13 NOTE — Progress Notes (Signed)
Patient agitated and refusing medications. Pt verbally and physically aggressive with staff. Pt attempting to kick and hit staff. Non-violent restraints still in place per order.  Allen Yu

## 2021-09-13 NOTE — Patient Outreach (Addendum)
  Care Coordination   Follow Up Visit Note   09/13/2021 Name: Allen Yu MRN: [id number] DOB: [birth year]  Allen Yu is a 76 y.o. year old male who sees [name], MD for primary care. I spoke with Miki Kins on behalf of Allen Yu by phone today after a message was left by [name]  What matters to the patients health and wellness today?  Questions about level of care services from palliative and hospice services     Goals Addressed               This Visit's Progress     Patient Stated     COMPLETED: Level of care management/support (pt-stated)        Care Coordination Interventions: Provided patient and/or caregiver with quality information about home health, palliative care and hospice services in [facility]. Questions answered about level of care services, increase heart rates related to atrial fibrillation and infection, changes in behavior related to mental and memory conditions (community resource) Screening for signs and symptoms of depression related to chronic disease state  Assessed social determinant of health barriers Caregiver concerns acknowledge, support offered, Questions answered   Online education for palliative care (video), hospice, end of life care and death and dying sent via my chart         SDOH assessments and interventions completed:  Yes  SDOH Interventions Today    Flowsheet Row Most Recent Value  SDOH Interventions   Stress Interventions Other (Comment)  [answered questions for wife about palliative, hospice Encouragement provided]  Depression Interventions/Treatment  Medication, Counseling, Currently on Treatment  [patient has been followed by [facility] counselors for treatment and medications]        Care Coordination Interventions Activated:  Yes  Care Coordination Interventions:  Yes, provided   Follow up plan: No further intervention required.   Encounter Outcome:  Pt. Visit Completed   Ely Spragg L. [name], RN, BSN,  Edie Coordinator Office number [phone]

## 2021-09-13 NOTE — Progress Notes (Signed)
Patient's CBG 53. RN gave 1 amp of dextrose. Rechecked CBG and came up to 137. Will continue to check CBG.  Allen Yu

## 2021-09-13 NOTE — Progress Notes (Signed)
PROGRESS NOTE    Allen Yu  WJX:[id number] DOB: [birth year] DOA: 09/06/2021 PCP: [name], MD   Brief Narrative: 76 year old with past medical history significant for dementia, peripheral vascular disease, previous transmetatarsal amputation, urinary retention requiring chronic Foley catheter, diabetes, ischemic cardiomyopathy ongoing tobacco abuse, paroxysmal A-fib who presents with several days of failure to thrive.  On further evaluation he was found to have sepsis secondary to dry gangrene and also complicated by acute kidney injury and metabolic encephalopathy. Patient also had episode of hematochezia, he received blood transfusion.  GI was consulted.  Recommended CT scan.  And okay to resume Plavix from GI point of view if needed. She was also evaluated by cardiology for atrial flutter  And chronic systolic heart failure.  Occasions were adjusted, metoprolol was increased. He was evaluated by vascular for dry gangrene, per vascular patient will need right AKA however given his current clinical state he may be served with comfort care.  Palliative  care was consulted, plan is for home with hospice.  Continue current care   Assessment & Plan:   Principal Problem:   Sepsis ([place]) Active Problems:   AKI (acute kidney injury) ([place])   Pressure injury of skin   Melena   Abnormal CT scan, gastrointestinal tract   Atypical atrial flutter ([facility])   Gastrointestinal hemorrhage  1-Sepsis due to dry leg gangrene, PVD -Cultures negative -He was evaluated by vascular for dry gangrene, per vascular patient will need right AKA however given his current clinical state he may be served with comfort care. -Palliative discussed with family, plans is to continue current care and discharge home with Hospice.  -Continue with IV ceftriaxone  2-Acute metabolic encephalopathy Patient with underlying dementia, agitated at times and hitting staff. Delirium precaution  Acute kidney injury on CKD  stage IIIb Hyperphosphatemia Creatinine baseline 1.7--2 Treated with IV fluids Holding torsemide and losartan  UTI: Retention with chronic Foley catheter Urine culture grew Pseudomonas,  E faecalis. Foley catheter recently changed Received 7 days of cefepime  Acute on chronic anemia on CKD Also GI bleed See if 3 units of packed red blood cell GI recommended CT scan but patient was not able to cooperate with CT Continue with PPI  Paroxysmal  A-fib, A-flutter Cardiology  consulted increase Lopressor Not on anticoagulation due to recent GI bleed  Chronic systolic and diastolic heart failure: Holding torsemide due to dehydration  Diabetes type 2: Continue with sliding scale insulin, hold Semglee due to hypoglycemia.   Dementia, failure to thrive Continue Aricept, mirtazapine, risperidone  Goals of care; Plan to discharge home with hospice   Pressure Injury 09/07/21 Sacrum Mid Stage 1 -  Intact skin with non-blanchable redness of a localized area usually over a bony prominence. pink color fully granulated pressure ulcer (Active)  09/07/21 2000  Location: Sacrum  Location Orientation: Mid  Staging: Stage 1 -  Intact skin with non-blanchable redness of a localized area usually over a bony prominence.  Wound Description (Comments): pink color fully granulated pressure ulcer  Present on Admission: Yes  Dressing Type Foam - Lift dressing to assess site every shift 09/12/21 1940     Nutrition Problem: Increased nutrient needs Etiology: wound healing    Signs/Symptoms: estimated needs    Interventions: Liberalize Diet, Nepro shake, MVI, Prostat  Estimated body mass index is 14.95 kg/m as calculated from the following:   Height as of this encounter: '6\' 1"'$  (1.854 m).   Weight as of this encounter: 51.4 kg.   DVT  prophylaxis: SCDs Code Status: Full code Family Communication: No family at bedside, palliative talking with family Disposition Plan:  Status is:  Inpatient Remains inpatient appropriate because: Sepsis, limb ischemia, UTI    Consultants:  Vascular Palliative care  Procedures:    Subjective: He is alert, face cover. He said leave me alone.   Objective: Vitals:   09/13/21 0803 09/13/21 0900 09/13/21 1200 09/13/21 1600  BP: 120/85  125/75 118/74  Pulse: (!) 131     Resp: 20  (!) 21 20  Temp:  98.3 F (36.8 C)    TempSrc: Oral     SpO2:      Weight:      Height:        Intake/Output Summary (Last 24 hours) at 09/13/2021 1711 Last data filed at 09/13/2021 1645 Gross per 24 hour  Intake 100 ml  Output 2100 ml  Net -2000 ml   Filed Weights   09/08/21 0117 09/10/21 0410 09/11/21 0400  Weight: 47.7 kg 47.5 kg 51.4 kg    Examination:  General exam: Appears calm and comfortable  Respiratory system: Clear to auscultation.  Cardiovascular system: S1 & S2 heard, RIRR Gastrointestinal system: Abdomen is nondistended, soft and nontender. No organomegaly or masses felt. Normal bowel sounds heard. Central nervous system: Alert  Extremities: no edema  Data Reviewed: I have personally reviewed following labs and imaging studies  CBC: Recent Labs  Lab 09/09/21 0555 09/09/21 1426 09/10/21 0546 09/10/21 0925 09/11/21 1007 09/12/21 0724 09/13/21 0828  WBC 16.3*  --  15.8*  --  21.0* 20.5* 17.9*  NEUTROABS 14.1*  --  13.8*  --  18.9* 17.8* 15.2*  HGB 9.9*   < > 10.0* 10.0* 9.3* 9.3* 9.2*  HCT 29.0*   < > 29.0* 29.7* 27.4* 28.1* 27.3*  MCV 91.2  --  90.9  --  93.2 94.6 92.5  PLT 393  --  328  --  319 285 246   < > = values in this interval not displayed.   Basic Metabolic Panel: Recent Labs  Lab 09/07/21 0500 09/08/21 1323 09/09/21 0555 09/10/21 0546 09/11/21 1007 09/12/21 0724 09/13/21 0828  NA 134*   < > 140 138 138 140 139  K 4.1   < > 4.1 3.7 3.7 3.4* 3.1*  CL 99   < > 106 109 107 110 113*  CO2 20*   < > 24 19* 17* 17* 18*  GLUCOSE 151*   < > 55* 116* 185* 189* 89  BUN 136*   < > 89* 68* 61* 52* 40*   CREATININE 5.88*   < > 3.23* 2.39* 2.46* 2.30* 2.02*  CALCIUM 8.8*   < > 9.2 9.0 8.9 8.8* 8.7*  MG 2.1  --   --   --   --   --   --   PHOS 6.4*  --   --   --   --   --   --    < > = values in this interval not displayed.   GFR: Estimated Creatinine Clearance: 23 mL/min (A) (by C-G formula based on SCr of 2.02 mg/dL (H)). Liver Function Tests: Recent Labs  Lab 09/08/21 1323  AST 22  ALT 13  ALKPHOS 87  BILITOT 0.8  PROT 6.4*  ALBUMIN 2.4*   No results for input(s): "LIPASE", "AMYLASE" in the last 168 hours. No results for input(s): "AMMONIA" in the last 168 hours. Coagulation Profile: No results for input(s): "INR", "PROTIME" in the last 168 hours. Cardiac  Enzymes: No results for input(s): "CKTOTAL", "CKMB", "CKMBINDEX", "TROPONINI" in the last 168 hours. BNP (last 3 results) No results for input(s): "PROBNP" in the last 8760 hours. HbA1C: No results for input(s): "HGBA1C" in the last 72 hours. CBG: Recent Labs  Lab 09/13/21 0413 09/13/21 0947 09/13/21 1303 09/13/21 1551 09/13/21 1634  GLUCAP 90 90 75 53* 137*   Lipid Profile: No results for input(s): "CHOL", "HDL", "LDLCALC", "TRIG", "CHOLHDL", "LDLDIRECT" in the last 72 hours. Thyroid Function Tests: No results for input(s): "TSH", "T4TOTAL", "FREET4", "T3FREE", "THYROIDAB" in the last 72 hours. Anemia Panel: No results for input(s): "VITAMINB12", "FOLATE", "FERRITIN", "TIBC", "IRON", "RETICCTPCT" in the last 72 hours. Sepsis Labs: No results for input(s): "PROCALCITON", "LATICACIDVEN" in the last 168 hours.  Recent Results (from the past 240 hour(s))  Blood Culture (routine x 2)     Status: None   Collection Time: 09/06/21  9:29 AM   Specimen: Right Antecubital; Blood  Result Value Ref Range Status   Specimen Description RIGHT ANTECUBITAL  Final   Special Requests   Final    BOTTLES DRAWN AEROBIC AND ANAEROBIC Blood Culture adequate volume   Culture   Final    NO GROWTH 5 DAYS Performed at [facility], [address], [place], [place] [address]    Report Status 09/11/2021 FINAL  Final  Blood Culture (routine x 2)     Status: None   Collection Time: 09/06/21  9:34 AM   Specimen: Left Antecubital; Blood  Result Value Ref Range Status   Specimen Description LEFT ANTECUBITAL  Final   Special Requests   Final    BOTTLES DRAWN AEROBIC AND ANAEROBIC Blood Culture results may not be optimal due to an inadequate volume of blood received in culture bottles   Culture   Final    NO GROWTH 5 DAYS Performed at [facility], [address] [place], [place] [address]    Report Status 09/11/2021 FINAL  Final  Urine Culture     Status: Abnormal   Collection Time: 09/06/21 10:22 AM   Specimen: In/Out Cath Urine  Result Value Ref Range Status   Specimen Description   Final    IN/OUT CATH URINE Performed at [facility], [address], [place], [place] [address]    Special Requests   Final    NONE Performed at [facility], [address], [place], [place] [address]    Culture (A)  Final    >=100,000 COLONIES/mL PSEUDOMONAS AERUGINOSA >=100,000 COLONIES/mL ENTEROCOCCUS FAECALIS    Report Status 09/09/2021 FINAL  Final   Organism ID, Bacteria PSEUDOMONAS AERUGINOSA (A)  Final   Organism ID, Bacteria ENTEROCOCCUS FAECALIS (A)  Final      Susceptibility   Enterococcus faecalis - MIC*    AMPICILLIN <=2 SENSITIVE Sensitive     NITROFURANTOIN <=16 SENSITIVE Sensitive     VANCOMYCIN 1 SENSITIVE Sensitive     * >=100,000 COLONIES/mL ENTEROCOCCUS FAECALIS   Pseudomonas aeruginosa - MIC*    CEFTAZIDIME 16 INTERMEDIATE Intermediate     CIPROFLOXACIN 1 INTERMEDIATE Intermediate     GENTAMICIN 4 SENSITIVE Sensitive     IMIPENEM 2 SENSITIVE Sensitive     * >=100,000 COLONIES/mL PSEUDOMONAS AERUGINOSA         Radiology Studies: No results found.      Scheduled Meds:  (feeding supplement) PROSource Plus  30 mL Oral TID BM   ascorbic acid  500 mg Oral BID   atorvastatin  40 mg Oral QHS    Chlorhexidine Gluconate Cloth  6 each Topical Daily   clopidogrel  75 mg Oral Daily   donepezil  10 mg Oral QHS   feeding supplement  237 mL Oral TID BM   ferrous sulfate  325 mg Oral Q breakfast   insulin aspart  0-9 Units Subcutaneous Q4H   insulin glargine-yfgn  5 Units Subcutaneous Daily   metoprolol tartrate  25 mg Oral TID   mirtazapine  7.5 mg Oral QHS   multivitamin with minerals  1 tablet Oral Daily   mouth rinse  15 mL Mouth Rinse 4 times per day   pantoprazole (PROTONIX) IV  40 mg Intravenous Q12H   zinc sulfate  220 mg Oral Daily   Continuous Infusions:  cefTRIAXone (ROCEPHIN)  IV Stopped (09/13/21 1038)     LOS: 7 days    Time spent: 35 minutes    Daron Breeding A Mattilyn Crites, MD [facility]   If 7PM-7AM, please contact night-coverage [email or web address]  09/13/2021, 5:11 PM

## 2021-09-13 NOTE — Plan of Care (Signed)
  Problem: Education: Goal: Knowledge of General Education information will improve Description: Including pain rating scale, medication(s)/side effects and non-pharmacologic comfort measures Outcome: Not Progressing   Problem: Health Behavior/Discharge Planning: Goal: Ability to manage health-related needs will improve Outcome: Not Progressing   Problem: Clinical Measurements: Goal: Ability to maintain clinical measurements within normal limits will improve Outcome: Progressing Goal: Will remain free from infection Outcome: Progressing Goal: Diagnostic test results will improve Outcome: Progressing Goal: Respiratory complications will improve Outcome: Progressing Goal: Cardiovascular complication will be avoided Outcome: Not Progressing   Problem: Activity: Goal: Risk for activity intolerance will decrease Outcome: Not Progressing   Problem: Nutrition: Goal: Adequate nutrition will be maintained Outcome: Progressing   Problem: Coping: Goal: Level of anxiety will decrease Outcome: Not Progressing   Problem: Elimination: Goal: Will not experience complications related to bowel motility Outcome: Progressing Goal: Will not experience complications related to urinary retention Outcome: Not Progressing   Problem: Pain Managment: Goal: General experience of comfort will improve Outcome: Progressing   Problem: Safety: Goal: Ability to remain free from injury will improve Outcome: Progressing   Problem: Skin Integrity: Goal: Risk for impaired skin integrity will decrease Outcome: Progressing   Problem: Education: Goal: Ability to describe self-care measures that may prevent or decrease complications (Diabetes Survival Skills Education) will improve Outcome: Not Progressing   Problem: Health Behavior/Discharge Planning: Goal: Ability to identify and utilize available resources and services will improve Outcome: Progressing Goal: Ability to manage health-related needs  will improve Outcome: Progressing   Problem: Metabolic: Goal: Ability to maintain appropriate glucose levels will improve Outcome: Progressing   Problem: Nutritional: Goal: Maintenance of adequate nutrition will improve Outcome: Progressing Goal: Progress toward achieving an optimal weight will improve Outcome: Progressing   Problem: Skin Integrity: Goal: Risk for impaired skin integrity will decrease Outcome: Progressing   Problem: Tissue Perfusion: Goal: Adequacy of tissue perfusion will improve Outcome: Progressing   Problem: Safety: Goal: Non-violent Restraint(s) Outcome: Not Progressing

## 2021-09-13 NOTE — Progress Notes (Signed)
   09/13/21 1958  Assess: MEWS Score  Pulse Rate (!) 125  ECG Heart Rate (!) 125  Resp 18  SpO2 98 %  O2 Device Room Air  Assess: MEWS Score  MEWS Temp 0  MEWS Systolic 0  MEWS Pulse 2  MEWS RR 0  MEWS LOC 0  MEWS Score 2  MEWS Score Color Yellow  Assess: if the MEWS score is Yellow or Red  Were vital signs taken at a resting state? Yes  Focused Assessment No change from prior assessment  Does the patient meet 2 or more of the SIRS criteria? Yes  Does the patient have a confirmed or suspected source of infection? Yes  Provider and Rapid Response Notified? No  MEWS guidelines implemented *See Row Information* No, vital signs rechecked  Treat  Pain Scale Faces  Pain Score 0  Take Vital Signs  Increase Vital Sign Frequency  Yellow: Q 2hr X 2 then Q 4hr X 2, if remains yellow, continue Q 4hrs  Escalate  MEWS: Escalate Yellow: discuss with charge nurse/RN and consider discussing with provider and RRT  Notify: Charge Nurse/RN  Name of Charge Nurse/RN Notified Steph  Date Charge Nurse/RN Notified 09/13/21  Time Charge Nurse/RN Notified 2000  Assess: SIRS CRITERIA  SIRS Temperature  0  SIRS Pulse 1  SIRS Respirations  0  SIRS WBC 1  SIRS Score Sum  2

## 2021-09-13 NOTE — TOC Initial Note (Signed)
Transition of Care (TOC) - Initial/Assessment Note  Marvetta Gibbons RN, BSN Transitions of Care Unit 4E- RN Case Manager See Treatment Team for direct phone #    Patient Details  Name: Allen Yu MRN: [id number] Date of Birth: 01-Apr-1945  Transition of Care [facility]) CM/SW Contact:    Dawayne Patricia, RN Phone Number: 09/13/2021, 2:35 PM  Clinical Narrative:                 Pt from home w/ wife, admitted w/ sepsis, ischemic foot, received msg from [facility] that wife interested in taking pt home w/ Hospice.   Wife not at bedside- TC made to speak with wife about home hospice needs. Discussed plan to return home w/ hospice- per wife she is leaning that way and confirms she has spoken with PC- however she states she would like to speak with the doctors as she has some questions and she also would like to speak with her husband about plan. Discussed Hospice services and choice offered per Per CMS guidelines from medicare.gov website with star ratings (copy placed in shadow chart), provided website for wife to look up list herself and review star ratings- wife to get back with this CM on agency selection.  Home Hospice referral pending choice at this time   Discussed potential DME needs- wife reports that she feels pt has needed DME at this time. Pt also has [place] services through his [employer] at the [place].   Call made to [facility] at the [place] to confirm available services- [phone] ext [phone]) per Janett Billow- pt is 100% service connected and has available to him all benefits and services that he might need. Per Janett Billow- pt has personal care services via the [place]- that his wife provides under [place] contract. [facility] whatever [facility] Hospice agency that the wife wants to refer to and coordinate Home Hospice needs.   TOC will f/u with wife tomorrow regarding Hospice choice and referral.   Expected Discharge Plan: Home w Hospice Care Barriers to Discharge:  Continued Medical Work up   Patient Goals and CMS Choice   CMS Medicare.gov Compare Post Acute Care list provided to:: Patient Represenative (must comment) Choice offered to / list presented to : Spouse  Expected Discharge Plan and Services Expected Discharge Plan: Home w Hospice Care In-house Referral: Clinical Social Work Discharge Planning Services: CM Consult Post Acute Care Choice: Hospice Living arrangements for the past 2 months: Single Family Home                                      Prior Living Arrangements/Services Living arrangements for the past 2 months: Single Family Home Lives with:: Spouse Patient language and need for interpreter reviewed:: Yes Do you feel safe going back to the place where you live?: Yes      Need for Family Participation in Patient Care: Yes (Comment) Care giver support system in place?: Yes (comment) Current home services: DME Criminal Activity/Legal Involvement Pertinent to Current Situation/Hospitalization: No - Comment as needed  Activities of Daily Living      Permission Sought/Granted Permission sought to share information with : Facility Art therapist granted to share information with : Yes, Verbal Permission Granted              Emotional Assessment   Attitude/Demeanor/Rapport: Aggressive (Verbally and/or physically) Affect (typically observed): Irritable  Orientation: : Oriented to Self Alcohol / Substance Use: Not Applicable Psych Involvement: No (comment)  Admission diagnosis:  AKI (acute kidney injury) ([place]) [N17.9] Sepsis ([place]) [A41.9] Anemia, unspecified type [D64.9] Patient Active Problem List   Diagnosis Date Noted   Atypical atrial flutter ([place])    Gastrointestinal hemorrhage    Melena    Abnormal CT scan, gastrointestinal tract    Pressure injury of skin 09/08/2021   Sepsis ([place]) 09/06/2021   Ulcer of right lower extremity with bone involvement without evidence of necrosis  ([place])    AKI (acute kidney injury) ([place]) 07/21/2021   Complete traumatic amputation at level between unspecified hip and knee, initial encounter ([place]) 07/17/2021   Need for assistance with personal care 07/17/2021   Persons encountering health services in other specified circumstances 07/17/2021   Type 2 diabetes mellitus with hyperglycemia ([place]) 07/17/2021   Hypokalemia 04/13/2021   Colitis 04/12/2021   Bacteremia 04/11/2021   Pneumonia 90/30/0923   Chronic systolic CHF (congestive heart failure) ([place]) 04/11/2021   Acute respiratory failure with hypoxia and hypercarbia (HCC) 01/23/2021   CKD (chronic kidney disease) stage 3, GFR 30-59 ml/min (HCC) 01/23/2021   Acute on chronic systolic CHF (congestive heart failure) ([place]) 01/23/2021   Acute respiratory failure with hypoxia ([place]) 01/13/2021   Encounter for fitting and adjustment of hearing aid 12/21/2020   Other specified problems related to psychosocial circumstances 11/21/2020   Sensorineural hearing loss, bilateral 11/21/2020   Tobacco dependence 10/06/2020   Benign prostatic hyperplasia with urinary obstruction 10/06/2020   Above knee amputation of left lower extremity ([place]) 10/06/2020   Other specified counseling 10/06/2020   Severe sepsis ([place]) 09/24/2020   Schizophrenia, unspecified ([place]) 09/24/2020   Unspecified dementia, unspecified severity, without behavioral disturbance, psychotic disturbance, mood disturbance, and anxiety ([place]) 09/24/2020   Lung nodule 09/24/2020   Indwelling Foley catheter present 09/24/2020   Peripheral vascular disease ([place]) 09/24/2020   Mass of testicle 09/24/2020   Problem related to unspecified psychosocial circumstances 08/01/2020   Hypermetropia 07/22/2020   Unspecified atrial fibrillation ([place]) 07/22/2020   Klebsiella sepsis ([place]) 07/22/2020   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   SIRS (systemic inflammatory response syndrome) ([place]) 30/07/6224   Acute metabolic encephalopathy 33/35/4562    Hyponatremia    S/P AKA (above knee amputation) unilateral, left ([place]) 06/28/2020   Gangrene of right foot ([place]) 06/28/2020   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 ([place]) 05/27/2020   Critical lower limb ischemia ([place]) 05/21/2020   Malnutrition ([place]) 05/19/2020   Heart failure, unspecified ([place]) 01/23/2020   Plantar callus 12/24/2019   Cavitary pneumonia 09/14/2019   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   Unintentional weight loss 09/14/2019   Coronary artery calcification seen on CT scan 10/28/2018   Hilar adenopathy 04/10/2016   Lung nodule seen on imaging study 07/09/2015   Acute encephalopathy    Hypoglycemia due to insulin    Type 2 diabetes mellitus with other circulatory complications ([place]) 56/38/9373   Acute kidney injury superimposed on chronic kidney disease ([place]) 07/08/2015   Tobacco use disorder 07/08/2015   Diverticulosis of colon without hemorrhage    Left inguinal hernia 09/16/2013   Altered mental status 11/17/2012   Hypothermia 11/17/2012   Leukocytosis 11/17/2012   Chronic kidney disease, stage 3 unspecified ([place])    Post-traumatic stress disorder, chronic    GERD (gastroesophageal reflux disease)    Essential (primary) hypertension 09/10/2011   Anemia 09/10/2011   Diabetic neuropathy ([place]) 09/10/2011   DDD (degenerative disc disease), lumbar 09/10/2011  Psychosis ([place]) 09/10/2011   Diabetes mellitus type 2, uncontrolled 09/10/2011   Hyperlipemia 09/10/2011   Microalbuminuria 09/10/2011   PCP:  [name], MD Pharmacy:   [facility] - [address], [place] [address] Phone: [phone] Fax: [place], [place] [place] [address] Phone: [phone] Fax: [place], [place] - [place] [address] [place] [address] Phone: [phone] Fax: [phone]     Social Determinants of Health (SDOH) Interventions    Readmission Risk Interventions    07/05/2021   12:17 PM 04/17/2021    3:16 PM 01/25/2021    9:26 AM  Readmission Risk Prevention Plan  Transportation Screening Complete Complete Complete  Medication Review (RN Care Manager) Complete Complete Complete  PCP or Specialist appointment within 3-5 days of discharge Not Complete    HRI or Home Care Consult  Patient refused Complete  SW Recovery Care/Counseling Consult Complete Complete Complete  Palliative Care Screening Not Applicable Not Applicable Not [place] Not Applicable Not Applicable Not Applicable

## 2021-09-13 NOTE — Progress Notes (Signed)
HOSPITAL MEDICINE OVERNIGHT EVENT NOTE    Notified by nursing that patient continues to be extremely agitated, attempting to strike at staff, regularly attempting to pull at medical hardware, regularly attempting to get out of bed.  Patient is not following commands and continues to place himself and staff at risk despite multiple different measures being applied.  TeleSitter is being continued.  An in person sitter unfortunately is not available.  Due to patient continuing to be a risk to himself and others will renew restraint orders for now.  Continue to monitor closely.  [name]  MD [facility]

## 2021-09-13 NOTE — Progress Notes (Signed)
Pt is agitated and refused lab draw at 400. Lab will try again at 700

## 2021-09-14 DIAGNOSIS — A419 Sepsis, unspecified organism: Secondary | ICD-10-CM | POA: Diagnosis not present

## 2021-09-14 DIAGNOSIS — N179 Acute kidney failure, unspecified: Secondary | ICD-10-CM | POA: Diagnosis not present

## 2021-09-14 DIAGNOSIS — R6520 Severe sepsis without septic shock: Secondary | ICD-10-CM | POA: Diagnosis not present

## 2021-09-14 LAB — GLUCOSE, CAPILLARY
Glucose-Capillary: 114 mg/dL — ABNORMAL HIGH (ref 70–99)
Glucose-Capillary: 120 mg/dL — ABNORMAL HIGH (ref 70–99)
Glucose-Capillary: 115 mg/dL — ABNORMAL HIGH (ref 70–99)
Glucose-Capillary: 122 mg/dL — ABNORMAL HIGH (ref 70–99)
Glucose-Capillary: 128 mg/dL — ABNORMAL HIGH (ref 70–99)
Glucose-Capillary: 119 mg/dL — ABNORMAL HIGH (ref 70–99)
Glucose-Capillary: 123 mg/dL — ABNORMAL HIGH (ref 70–99)

## 2021-09-14 LAB — CBC WITH DIFFERENTIAL/PLATELET
Abs Immature Granulocytes: 0.15 10*3/uL — ABNORMAL HIGH (ref 0.00–0.07)
Basophils Absolute: 0 10*3/uL (ref 0.0–0.1)
Basophils Relative: 0 %
Eosinophils Absolute: 0 10*3/uL (ref 0.0–0.5)
Eosinophils Relative: 0 %
HCT: 26.3 % — ABNORMAL LOW (ref 39.0–52.0)
Hemoglobin: 9.1 g/dL — ABNORMAL LOW (ref 13.0–17.0)
Immature Granulocytes: 1 %
Lymphocytes Relative: 6 %
Lymphs Abs: 1.1 10*3/uL (ref 0.7–4.0)
MCH: 31.8 pg (ref 26.0–34.0)
MCHC: 34.6 g/dL (ref 30.0–36.0)
MCV: 92 fL (ref 80.0–100.0)
Monocytes Absolute: 1.4 10*3/uL — ABNORMAL HIGH (ref 0.1–1.0)
Monocytes Relative: 7 %
Neutro Abs: 16.4 10*3/uL — ABNORMAL HIGH (ref 1.7–7.7)
Neutrophils Relative %: 86 %
Platelets: 230 10*3/uL (ref 150–400)
RBC: 2.86 MIL/uL — ABNORMAL LOW (ref 4.22–5.81)
RDW: 14.7 % (ref 11.5–15.5)
WBC: 19.1 10*3/uL — ABNORMAL HIGH (ref 4.0–10.5)
nRBC: 0 % (ref 0.0–0.2)

## 2021-09-14 MED ORDER — METOPROLOL TARTRATE 25 MG PO TABS
37.5000 mg | ORAL_TABLET | Freq: Three times a day (TID) | ORAL | Status: DC
  Administered 2021-09-14 – 2021-09-15 (×3): 37.5 mg via ORAL
  Filled 2021-09-14 (×3): qty 1

## 2021-09-14 MED ORDER — PANTOPRAZOLE SODIUM 40 MG PO TBEC
40.0000 mg | DELAYED_RELEASE_TABLET | Freq: Two times a day (BID) | ORAL | Status: DC
  Administered 2021-09-14 – 2021-09-15 (×2): 40 mg via ORAL
  Filled 2021-09-14 (×2): qty 1

## 2021-09-14 NOTE — Plan of Care (Signed)
  Problem: Activity: Goal: Risk for activity intolerance will decrease Outcome: Progressing   

## 2021-09-14 NOTE — Progress Notes (Signed)
Nutrition Brief Note  Chart reviewed. Plans for D/C home with Hospice care. Patient is refusing to eat most meals. Meal intakes documented at 0%. No further nutrition interventions planned at this time.  Please re-consult as needed.   Lucas Mallow RD, LDN, CNSC Please refer to Amion for contact information.

## 2021-09-14 NOTE — Consult Note (Signed)
   [name] CM Inpatient Consult   09/14/2021  Allen Yu [birth year] [id number]   [name] [ACO] Patient:    Primary Care Provider: [name], MD,  is an Independent Embedded provider with a Care Management team and program and is listed for the [facility] follow up needs    Review request per [place] Team members today, as patient had an external care management with Embedded provider in the past.   Reviewed to assess for potential [place] Management service needs for post hospital transition for readmission prevention needs as a request of [facility] RNCM per secure chat for potential post hospital - hospice care.  Review of patient's medical record reveals patient is currently being recommended for Hospice services.     Plan:  Continue to follow progress and disposition to assess for post hospital care management needs with for any [facility] Care Coordination needs. If patient transitions to a Hospice care, needs can be met at that level of care for transitional needs. Will sign off, if appropriate.  For questions contact:    Natividad Brood, RN BSN [facility] Liaison  [phone] business mobile phone Toll free office [phone]  Fax number: [phone] [name]

## 2021-09-14 NOTE — Plan of Care (Signed)
°  Problem: Coping: °Goal: Level of anxiety will decrease °Outcome: Progressing °  °

## 2021-09-14 NOTE — Progress Notes (Signed)
   09/13/21 2200  Assess: MEWS Score  Temp 98.5 F (36.9 C)  BP (!) 140/86  MAP (mmHg) 104  Pulse Rate (!) 25  ECG Heart Rate (!) 125  Resp (!) 21  Level of Consciousness Alert  SpO2 100 %  O2 Device Room Air  Patient Activity (if Appropriate) In bed  Assess: MEWS Score  MEWS Temp 0  MEWS Systolic 0  MEWS Pulse 2  MEWS RR 1  MEWS LOC 0  MEWS Score 3  MEWS Score Color Yellow  Assess: if the MEWS score is Yellow or Red  Were vital signs taken at a resting state? Yes  Focused Assessment No change from prior assessment  Does the patient meet 2 or more of the SIRS criteria? Yes  Does the patient have a confirmed or suspected source of infection? Yes  Provider and Rapid Response Notified? No  MEWS guidelines implemented *See Row Information* No, vital signs rechecked  Treat  Pain Scale Faces  Pain Score 0  Take Vital Signs  Increase Vital Sign Frequency  Yellow: Q 2hr X 2 then Q 4hr X 2, if remains yellow, continue Q 4hrs  Escalate  MEWS: Escalate Yellow: discuss with charge nurse/RN and consider discussing with provider and RRT  Notify: Charge Nurse/RN  Name of Charge Nurse/RN Notified Steph  Date Charge Nurse/RN Notified 09/13/20  Time Charge Nurse/RN Notified 2203  Assess: SIRS CRITERIA  SIRS Temperature  0  SIRS Pulse 1  SIRS Respirations  1  SIRS WBC 1  SIRS Score Sum  3

## 2021-09-14 NOTE — Progress Notes (Signed)
   Plans noted for hospice - no further recommendations at this time. Will sign-off. Call with questions.  Pixie Casino, MD, [facility], [place] Director of the Advanced Lipid Disorders &  Cardiovascular Risk Reduction Clinic Diplomate of the American Board of Clinical Lipidology Attending Cardiologist  Direct Dial: [phone]  Fax: [phone]  Website:  [email or web address]

## 2021-09-14 NOTE — Progress Notes (Signed)
   09/14/21 0000  Assess: MEWS Score  BP (!) 131/100  MAP (mmHg) 110  Pulse Rate 79  ECG Heart Rate (!) 116  Resp 14  Level of Consciousness Alert  SpO2 100 %  O2 Device Room Air  Patient Activity (if Appropriate) In bed  Assess: MEWS Score  MEWS Temp 0  MEWS Systolic 0  MEWS Pulse 2  MEWS RR 0  MEWS LOC 0  MEWS Score 2  MEWS Score Color Yellow  Assess: if the MEWS score is Yellow or Red  Were vital signs taken at a resting state? Yes  Focused Assessment No change from prior assessment  Provider and Rapid Response Notified? No  MEWS guidelines implemented *See Row Information* No, vital signs rechecked  Treat  Pain Scale Faces  Pain Score 0  Take Vital Signs  Increase Vital Sign Frequency  Yellow: Q 2hr X 2 then Q 4hr X 2, if remains yellow, continue Q 4hrs  Escalate  MEWS: Escalate Yellow: discuss with charge nurse/RN and consider discussing with provider and RRT  Notify: Charge Nurse/RN  Name of Charge Nurse/RN Notified Steph  Date Charge Nurse/RN Notified 09/13/21  Time Charge Nurse/RN Notified 0005  Assess: SIRS CRITERIA  SIRS Temperature  0  SIRS Pulse 1  SIRS Respirations  0  SIRS WBC 1  SIRS Score Sum  2

## 2021-09-14 NOTE — Progress Notes (Signed)
PROGRESS NOTE    Allen Yu  JFH:[id number] DOB: 02-02-1945 DOA: 09/06/2021 PCP: [name], MD   Brief Narrative: 76 year old with past medical history significant for dementia, peripheral vascular disease, previous transmetatarsal amputation, urinary retention requiring chronic Foley catheter, diabetes, ischemic cardiomyopathy ongoing tobacco abuse, paroxysmal A-fib who presents with several days of failure to thrive.  On further evaluation he was found to have sepsis secondary to dry gangrene and also complicated by acute kidney injury and metabolic encephalopathy. Patient also had episode of hematochezia, he received blood transfusion.  GI was consulted.  Recommended CT scan.  And okay to resume Plavix from GI point of view if needed. She was also evaluated by cardiology for atrial flutter  And chronic systolic heart failure.  Occasions were adjusted, metoprolol was increased. He was evaluated by vascular for dry gangrene, per vascular patient will need right AKA however given his current clinical state he may be served with comfort care.  Palliative  care was consulted, plan is for home with hospice.  Continue current care   Assessment & Plan:   Principal Problem:   Sepsis ([place]) Active Problems:   AKI (acute kidney injury) ([place])   Pressure injury of skin   Melena   Abnormal CT scan, gastrointestinal tract   Atypical atrial flutter (H[facility])   Gastrointestinal hemorrhage  1-Sepsis due to dry leg gangrene, PVD -Cultures negative -He was evaluated by vascular for dry gangrene, per vascular patient will need right AKA however given his current clinical state he may be served with comfort care. -Palliative discussed with family, plans is to continue current care and discharge home with Hospice.  -Continue with IV ceftriaxone. Plan to discharge on keflex and flagyl.   2-Acute metabolic encephalopathy Patient with underlying dementia, agitated at times and hitting staff. Delirium  precaution  Acute kidney injury on CKD stage IIIb Hyperphosphatemia Creatinine baseline 1.7--2 Treated with IV fluids Holding torsemide and losartan  UTI: Retention with chronic Foley catheter Urine culture grew Pseudomonas,  E faecalis. Foley catheter recently changed Received 7 days of cefepime  Acute on chronic anemia on CKD GI bleed Received  3 units of packed red blood cell GI recommended CT scan but patient was not able to cooperate with CT Continue with PPI  Paroxysmal  A-fib, A-flutter Cardiology  consulted increased Lopressor Not on anticoagulation due to recent GI bleed HR elevated, plan to increase metoprolol to 37.5 TID>   Chronic systolic and diastolic heart failure: Holding torsemide due to dehydration  Diabetes type 2: Continue with sliding scale insulin, hold Semglee due to hypoglycemia.   Dementia, failure to thrive Continue Aricept, mirtazapine, risperidone  Goals of care; Plan to discharge home with hospice. Spoke with wife, plan to discharge him with hospice. Will discharge on oral antibiotics, she understand infection cn get worse.     Pressure Injury 09/07/21 Sacrum Mid Stage 1 -  Intact skin with non-blanchable redness of a localized area usually over a bony prominence. pink color fully granulated pressure ulcer (Active)  09/07/21 2000  Location: Sacrum  Location Orientation: Mid  Staging: Stage 1 -  Intact skin with non-blanchable redness of a localized area usually over a bony prominence.  Wound Description (Comments): pink color fully granulated pressure ulcer  Present on Admission: Yes  Dressing Type Foam - Lift dressing to assess site every shift 09/14/21 1000     Nutrition Problem: Increased nutrient needs Etiology: wound healing    Signs/Symptoms: estimated needs    Interventions: Liberalize Diet, Nepro  shake, MVI, Prostat  Estimated body mass index is 15.39 kg/m as calculated from the following:   Height as of this encounter:  '6\' 1"'$  (1.854 m).   Weight as of this encounter: 52.9 kg.   DVT prophylaxis: SCDs Code Status: Full code Family Communication: Wife over phone 8/24 Disposition Plan:  Status is: Inpatient Remains inpatient appropriate because: Sepsis, limb ischemia, UTI    Consultants:  Vascular Palliative care  Procedures:    Subjective: He is alert, his face is cover. He is pleasant today  Objective: Vitals:   09/14/21 0300 09/14/21 0421 09/14/21 0800 09/14/21 1214  BP: 134/87  (!) 139/90 137/84  Pulse: (!) 173  (!) 105 97  Resp: '20  19 17  '$ Temp: 98.4 F (36.9 C)     TempSrc: Oral  Oral   SpO2: 96%  91% 99%  Weight:  52.9 kg    Height:        Intake/Output Summary (Last 24 hours) at 09/14/2021 1328 Last data filed at 09/14/2021 1200 Gross per 24 hour  Intake 940 ml  Output 950 ml  Net -10 ml    Filed Weights   09/10/21 0410 09/11/21 0400 09/14/21 0421  Weight: 47.5 kg 51.4 kg 52.9 kg    Examination:  General exam: NAD Respiratory system: CTA Cardiovascular system: S 1, S 2 IRR Gastrointestinal system: BS present, soft, nt Central nervous system: alert Extremities:no edema  Data Reviewed: I have personally reviewed following labs and imaging studies  CBC: Recent Labs  Lab 09/10/21 0546 09/10/21 0925 09/11/21 1007 09/12/21 0724 09/13/21 0828 09/14/21 0300  WBC 15.8*  --  21.0* 20.5* 17.9* 19.1*  NEUTROABS 13.8*  --  18.9* 17.8* 15.2* 16.4*  HGB 10.0* 10.0* 9.3* 9.3* 9.2* 9.1*  HCT 29.0* 29.7* 27.4* 28.1* 27.3* 26.3*  MCV 90.9  --  93.2 94.6 92.5 92.0  PLT 328  --  319 285 246 694    Basic Metabolic Panel: Recent Labs  Lab 09/09/21 0555 09/10/21 0546 09/11/21 1007 09/12/21 0724 09/13/21 0828  NA 140 138 138 140 139  K 4.1 3.7 3.7 3.4* 3.1*  CL 106 109 107 110 113*  CO2 24 19* 17* 17* 18*  GLUCOSE 55* 116* 185* 189* 89  BUN 89* 68* 61* 52* 40*  CREATININE 3.23* 2.39* 2.46* 2.30* 2.02*  CALCIUM 9.2 9.0 8.9 8.8* 8.7*    GFR: Estimated Creatinine  Clearance: 23.6 mL/min (A) (by C-G formula based on SCr of 2.02 mg/dL (H)). Liver Function Tests: Recent Labs  Lab 09/08/21 1323  AST 22  ALT 13  ALKPHOS 87  BILITOT 0.8  PROT 6.4*  ALBUMIN 2.4*    No results for input(s): "LIPASE", "AMYLASE" in the last 168 hours. No results for input(s): "AMMONIA" in the last 168 hours. Coagulation Profile: No results for input(s): "INR", "PROTIME" in the last 168 hours. Cardiac Enzymes: No results for input(s): "CKTOTAL", "CKMB", "CKMBINDEX", "TROPONINI" in the last 168 hours. BNP (last 3 results) No results for input(s): "PROBNP" in the last 8760 hours. HbA1C: No results for input(s): "HGBA1C" in the last 72 hours. CBG: Recent Labs  Lab 09/14/21 0021 09/14/21 0304 09/14/21 0942 09/14/21 1212 09/14/21 1322  GLUCAP 120* 114* 115* 128* 122*    Lipid Profile: No results for input(s): "CHOL", "HDL", "LDLCALC", "TRIG", "CHOLHDL", "LDLDIRECT" in the last 72 hours. Thyroid Function Tests: No results for input(s): "TSH", "T4TOTAL", "FREET4", "T3FREE", "THYROIDAB" in the last 72 hours. Anemia Panel: No results for input(s): "VITAMINB12", "FOLATE", "FERRITIN", "TIBC", "IRON", "  RETICCTPCT" in the last 72 hours. Sepsis Labs: No results for input(s): "PROCALCITON", "LATICACIDVEN" in the last 168 hours.  Recent Results (from the past 240 hour(s))  Blood Culture (routine x 2)     Status: None   Collection Time: 09/06/21  9:29 AM   Specimen: Right Antecubital; Blood  Result Value Ref Range Status   Specimen Description RIGHT ANTECUBITAL  Final   Special Requests   Final    BOTTLES DRAWN AEROBIC AND ANAEROBIC Blood Culture adequate volume   Culture   Final    NO GROWTH 5 DAYS Performed at [facility], [address], [place], [place] [address]    Report Status 09/11/2021 FINAL  Final  Blood Culture (routine x 2)     Status: None   Collection Time: 09/06/21  9:34 AM   Specimen: Left Antecubital; Blood  Result Value Ref Range Status    Specimen Description LEFT ANTECUBITAL  Final   Special Requests   Final    BOTTLES DRAWN AEROBIC AND ANAEROBIC Blood Culture results may not be optimal due to an inadequate volume of blood received in culture bottles   Culture   Final    NO GROWTH 5 DAYS Performed at [facility], [address], [place], [place] [address]    Report Status 09/11/2021 FINAL  Final  Urine Culture     Status: Abnormal   Collection Time: 09/06/21 10:22 AM   Specimen: In/Out Cath Urine  Result Value Ref Range Status   Specimen Description   Final    IN/OUT CATH URINE Performed at [facility], [address], [place], [place] [address]    Special Requests   Final    NONE Performed at [facility], [address] [place],  [address]    Culture (A)  Final    >=100,000 COLONIES/mL PSEUDOMONAS AERUGINOSA >=100,000 COLONIES/mL ENTEROCOCCUS FAECALIS    Report Status 09/09/2021 FINAL  Final   Organism ID, Bacteria PSEUDOMONAS AERUGINOSA (A)  Final   Organism ID, Bacteria ENTEROCOCCUS FAECALIS (A)  Final      Susceptibility   Enterococcus faecalis - MIC*    AMPICILLIN <=2 SENSITIVE Sensitive     NITROFURANTOIN <=16 SENSITIVE Sensitive     VANCOMYCIN 1 SENSITIVE Sensitive     * >=100,000 COLONIES/mL ENTEROCOCCUS FAECALIS   Pseudomonas aeruginosa - MIC*    CEFTAZIDIME 16 INTERMEDIATE Intermediate     CIPROFLOXACIN 1 INTERMEDIATE Intermediate     GENTAMICIN 4 SENSITIVE Sensitive     IMIPENEM 2 SENSITIVE Sensitive     * >=100,000 COLONIES/mL PSEUDOMONAS AERUGINOSA         Radiology Studies: No results found.      Scheduled Meds:  (feeding supplement) PROSource Plus  30 mL Oral TID BM   ascorbic acid  500 mg Oral BID   atorvastatin  40 mg Oral QHS   Chlorhexidine Gluconate Cloth  6 each Topical Daily   clopidogrel  75 mg Oral Daily   donepezil  10 mg Oral QHS   feeding supplement  237 mL Oral TID BM   ferrous sulfate  325 mg Oral Q breakfast   insulin aspart  0-9 Units Subcutaneous Q4H    metoprolol tartrate  37.5 mg Oral TID   mirtazapine  7.5 mg Oral QHS   multivitamin with minerals  1 tablet Oral Daily   mouth rinse  15 mL Mouth Rinse 4 times per day   pantoprazole  40 mg Oral BID AC   zinc sulfate  220 mg Oral Daily  Continuous Infusions:  cefTRIAXone (ROCEPHIN)  IV 2 g (09/14/21 0849)     LOS: 8 days    Time spent: 35 minutes    Jayvion Stefanski A Susie Pousson, MD [facility]   If 7PM-7AM, please contact night-coverage [email or web address]  09/14/2021, 1:28 PM

## 2021-09-14 NOTE — TOC Progression Note (Signed)
Transition of Care (TOC) - Progression Note  Marvetta Gibbons RN, BSN Transitions of Care Unit 4E- RN Case Manager See Treatment Team for direct phone #    Patient Details  Name: Allen Yu MRN: [id number] Date of Birth: 1945/08/05  Transition of Care [facility]) CM/SW Contact  Dahlia Client, Romeo Rabon, RN Phone Number: 09/14/2021, 10:54 AM  Clinical Narrative:    CM received call from wife- [name] this am regarding home hospice choice. She wanted more info on Agency list- which was provided. After further review with [name] she has selected [facility] for hosp[facility] referral. [name] still has some questions for the doctors so will send msg to attending and request for them to reach out to [facility] to answer questions that she has.   Call made to [place] left for Intake/Referral regarding Home Hospice- awaiting return call. CM also faxed referral into [facility] needs. - Home Hospice referral pending at this time.    Expected Discharge Plan: Home w Hospice Care Barriers to Discharge: Continued Medical Work up  Expected Discharge Plan and Services Expected Discharge Plan: Home w Hospice Care In-house Referral: Clinical Social Work Discharge Planning Services: CM Consult Post Acute Care Choice: Hospice Living arrangements for the past 2 months: Single Family Home                           HH Arranged: Disease Management (Hospice) [place]: [facility] Date [place]: 09/14/21 Time [place]: 1054 (Pending return call)     Social Determinants of Health (SDOH) Interventions    Readmission Risk Interventions    07/05/2021   12:17 PM 04/17/2021    3:16 PM 01/25/2021    9:26 AM  Readmission Risk Prevention Plan  Transportation Screening Complete Complete Complete  Medication Review Press photographer) Complete Complete Complete  PCP or Specialist appointment within 3-5 days of  discharge Not Complete    HRI or Home Care Consult  Patient refused Complete  SW Recovery Care/Counseling Consult Complete Complete Complete  Palliative Care Screening Not Applicable Not Applicable Not [place] Not Applicable Not Applicable Not Applicable

## 2021-09-14 NOTE — Progress Notes (Signed)
HOSPITAL MEDICINE OVERNIGHT EVENT NOTE    Nursing reports patient continues to exhibit bouts of extreme agitation, attempting to pull at medical hardware, attempting to get out of bed and placing himself at risk of injury.  Patient's ongoing agitation is despite use of risperidone.  We will continue restraints for now with intent to discontinue them as soon as able.  [name]  MD [facility]

## 2021-09-15 DIAGNOSIS — R6520 Severe sepsis without septic shock: Secondary | ICD-10-CM | POA: Diagnosis not present

## 2021-09-15 DIAGNOSIS — A419 Sepsis, unspecified organism: Secondary | ICD-10-CM | POA: Diagnosis not present

## 2021-09-15 DIAGNOSIS — N179 Acute kidney failure, unspecified: Secondary | ICD-10-CM | POA: Diagnosis not present

## 2021-09-15 LAB — GLUCOSE, CAPILLARY
Glucose-Capillary: 112 mg/dL — ABNORMAL HIGH (ref 70–99)
Glucose-Capillary: 117 mg/dL — ABNORMAL HIGH (ref 70–99)

## 2021-09-15 MED ORDER — MIRTAZAPINE 7.5 MG PO TABS: 7.5000 mg | ORAL_TABLET | Freq: Every day | ORAL | 0 refills | Status: DC

## 2021-09-15 MED ORDER — OXYCODONE-ACETAMINOPHEN 5-325 MG PO TABS: 1.0000 | ORAL_TABLET | ORAL | 0 refills | Status: DC | PRN

## 2021-09-15 MED ORDER — METOPROLOL TARTRATE 37.5 MG PO TABS: 37.5000 mg | ORAL_TABLET | Freq: Three times a day (TID) | ORAL | 1 refills | Status: DC

## 2021-09-15 MED ORDER — CEPHALEXIN 500 MG PO CAPS: 500.0000 mg | ORAL_CAPSULE | Freq: Three times a day (TID) | ORAL | 0 refills | Status: AC

## 2021-09-15 MED ORDER — METRONIDAZOLE 500 MG PO TABS: 500.0000 mg | ORAL_TABLET | Freq: Three times a day (TID) | ORAL | 0 refills | Status: AC

## 2021-09-15 NOTE — TOC Transition Note (Signed)
Transition of Care (TOC) - CM/SW Discharge Note Marvetta Gibbons RN, BSN Transitions of Care Unit 4E- RN Case Manager See Treatment Team for direct phone #     Patient Details  Name: Allen Yu MRN: [id number] Date of Birth: 1945-08-15  Transition of Care [facility]) CM/SW Contact:  Dawayne Patricia, RN Phone Number: 09/15/2021, 12:15 PM   Clinical Narrative:    CM received call from [facility] with [facility] and pt has been accepted for [place] has spoken with spouse- Allen Yu and per Sammie Bench spouse planning on transporting home- once home plan is for pt to call the Hospice line and let them know they are home so that Hospice can schedule the nursing visit.  No DME needs.   Have notified MD that pt has [place] arranged and ready for discharge home today. Will need GOLD DNR signed and given to spouse to take home w/ d/c instructions. Bedside RN aware.  RNCM will sign off for now as intervention is no longer needed. Please re-consult  if new needs arise, or contact RNCM assigned to treatment team for further questions/concerns.      Final next level of care: Home w Hospice Care Barriers to Discharge: Barriers Resolved   Patient Goals and CMS Choice Patient states their goals for this hospitalization and ongoing recovery are:: return home with Home Hospice CMS Medicare.gov Compare Post Acute Care list provided to:: Patient Represenative (must comment) Choice offered to / list presented to : Spouse  Discharge Placement               Home w/ Hospice        Discharge Plan and Services In-house Referral: Clinical Social Work Discharge Planning Services: CM Consult Post Acute Care Choice: Hospice          DME Arranged: N/A DME Agency: NA       HH Arranged: Disease Management (Hospice) [place]: [facility] Date [place]: 09/15/21 Time [place]: 72 Representative spoke with at [place]: [place] ([place]) Interventions     Readmission Risk Interventions    09/15/2021   12:14 PM 07/05/2021   12:17 PM 04/17/2021    3:16 PM  Readmission Risk Prevention Plan  Transportation Screening Complete Complete Complete  Medication Review Press photographer) Complete Complete Complete  PCP or Specialist appointment within 3-5 days of discharge Not Complete Not Complete   PCP/Specialist Appt Not Complete comments Pt going home with Hospice    [place] or [place] Complete  Patient refused  SW Recovery Care/Counseling Consult Complete Complete Complete  Palliative Care Screening Complete Not Applicable Not [place] Not Applicable Not Applicable Not Applicable

## 2021-09-15 NOTE — Progress Notes (Signed)
Pt discharged to home with family.  Discharge education reviewed, wife expresses understanding.  They understand to notify Hopsice when they return home.

## 2021-09-15 NOTE — Progress Notes (Signed)
Pt being discharged to home with hospice.  IVs and telemetry removed, CCMD notified.  Chronic foley still in place.  Pt wife notified he is ready for discharge and she will be here to pick him up in an hr or so.  Instructions reviewed with pt and son at bedside. Gold DNR form in D/C packet.

## 2021-09-15 NOTE — Care Management Important Message (Signed)
Important Message  Patient Details  Name: NAZARETH KIRK MRN: [id number] Date of Birth: 03/06/1945   Medicare Important Message Given:  Yes     Shelda Altes 09/15/2021, 10:49 AM

## 2021-09-15 NOTE — Progress Notes (Signed)
   09/15/21 0001  Assess: MEWS Score  Temp 98.8 F (37.1 C)  BP 114/63  MAP (mmHg) 80  Pulse Rate (!) 55  ECG Heart Rate (!) 106  Resp (!) 21  SpO2 98 %  O2 Device Room Air  Assess: MEWS Score  MEWS Temp 0  MEWS Systolic 0  MEWS Pulse 1  MEWS RR 1  MEWS LOC 0  MEWS Score 2  MEWS Score Color Yellow  Assess: if the MEWS score is Yellow or Red  Were vital signs taken at a resting state? Yes  Focused Assessment No change from prior assessment  Does the patient meet 2 or more of the SIRS criteria? Yes  Does the patient have a confirmed or suspected source of infection? Yes  Provider and Rapid Response Notified? No  MEWS guidelines implemented *See Row Information* No, vital signs rechecked  Treat  Pain Scale Faces  Pain Score Asleep  Take Vital Signs  Increase Vital Sign Frequency  Yellow: Q 2hr X 2 then Q 4hr X 2, if remains yellow, continue Q 4hrs  Escalate  MEWS: Escalate Yellow: discuss with charge nurse/RN and consider discussing with provider and RRT  Notify: Charge Nurse/RN  Name of Charge Nurse/RN Notified Red  Date Charge Nurse/RN Notified 09/15/21  Time Charge Nurse/RN Notified 0010  Assess: SIRS CRITERIA  SIRS Temperature  0  SIRS Pulse 1  SIRS Respirations  1  SIRS WBC 1  SIRS Score Sum  3

## 2021-09-18 DIAGNOSIS — E441 Mild protein-calorie malnutrition: Secondary | ICD-10-CM | POA: Diagnosis not present

## 2021-09-18 DIAGNOSIS — E1169 Type 2 diabetes mellitus with other specified complication: Secondary | ICD-10-CM | POA: Diagnosis not present

## 2021-09-18 DIAGNOSIS — I70235 Atherosclerosis of native arteries of right leg with ulceration of other part of foot: Secondary | ICD-10-CM | POA: Diagnosis not present

## 2021-09-18 NOTE — Discharge Summary (Addendum)
Physician Discharge Summary   Patient: Allen Yu MRN: [id number] DOB: 1946-01-03  Admit date:     09/06/2021  Discharge date: 09/15/2021  Discharge Physician: Elmarie Shiley   PCP: [name], MD   Recommendations at discharge:    Discharge with hospice care.   Discharge Diagnoses: Principal Problem:   Sepsis ([place]) Active Problems:   AKI (acute kidney injury) ([place])   Pressure injury of skin   Melena   Abnormal CT scan, gastrointestinal tract   Atypical atrial flutter ([facility])   Gastrointestinal hemorrhage  Resolved Problems:   * No resolved hospital problems. *  Hospital Course: 76 year old with past medical history significant for dementia, peripheral vascular disease, previous transmetatarsal amputation, urinary retention requiring chronic Foley catheter, diabetes, ischemic cardiomyopathy ongoing tobacco abuse, paroxysmal A-fib who presents with several days of failure to thrive.  On further evaluation he was found to have sepsis secondary to dry gangrene and also complicated by acute kidney injury and metabolic encephalopathy. Patient also had episode of hematochezia, he received blood transfusion.  GI was consulted.  Recommended CT scan.  And okay to resume Plavix from GI point of view if needed. She was also evaluated by cardiology for atrial flutter  And chronic systolic heart failure.  Occasions were adjusted, metoprolol was increased. He was evaluated by vascular for dry gangrene, per vascular patient will need right AKA however given his current clinical state he may be served with comfort care.   Palliative  care was consulted, plan is for home with hospice.  Continue current care Discharge home with hospice.   Assessment and Plan:   1-Sepsis due to dry leg gangrene, PVD -Cultures negative -He was evaluated by vascular for dry gangrene, per vascular patient will need right AKA however given his current clinical state he may be served with comfort  care. -Palliative discussed with family, plans is to continue current care and discharge home with Hospice.  -Treated with IV ceftriaxone. Plan to discharge on keflex and flagyl.  -Sepsis POA>   2-Acute metabolic encephalopathy Patient with underlying dementia, agitated at times and hitting staff. Delirium precaution   Acute kidney injury on CKD stage IIIb could be rekated to ATN  in setting of hypovolemia, sepsis.  Hyperphosphatemia Creatinine baseline 1.7--2 Treated with IV fluids Holding torsemide and losartan   UTI secondary to chronic foley catheter placement. Retention with chronic Foley catheter Urine culture grew Pseudomonas,  E faecalis. Foley catheter recently changed Received 7 days of cefepime   Acute on chronic anemia on CKD GI bleed Received  3 units of packed red blood cell GI recommended CT scan but patient was not able to cooperate with CT Continue with PPI   Paroxysmal  A-fib, A-flutter Cardiology  consulted increased Lopressor Not on anticoagulation due to recent GI bleed HR elevated, plan to increase metoprolol to 37.5 TID>    Chronic systolic and diastolic heart failure: Holding torsemide due to dehydration   Diabetes type 2: Continue with sliding scale insulin, hold Semglee due to hypoglycemia.    Dementia, failure to thrive Continue Aricept, mirtazapine, risperidone   Goals of care; Plan to discharge home with hospice. Spoke with wife, plan to discharge him with hospice. Will discharge on oral antibiotics, she understand infection can get worse.    See wound care documentation.      Pressure Injury 09/07/21 Sacrum Mid Stage 1 -  Intact skin with non-blanchable redness of a localized area usually over a bony prominence. pink color fully granulated pressure  ulcer (Active)  09/07/21 2000  Location: Sacrum  Location Orientation: Mid  Staging: Stage 1 -  Intact skin with non-blanchable redness of a localized area usually over a bony prominence.  Wound  Description (Comments): pink color fully granulated pressure ulcer  Present on Admission: Yes  Dressing Type Foam - Lift dressing to assess site every shift 09/14/21 1000             Consultants: vascular Procedures performed: None Disposition: Home Diet recommendation:  Discharge Diet Orders (From admission, onward)     Start     Ordered   09/15/21 0000  Diet - low sodium heart healthy        09/15/21 1154           Cardiac diet DISCHARGE MEDICATION: Allergies as of 09/15/2021       Reactions   Bee Venom Anaphylaxis   Codeine Other (See Comments)   incoherent  Other reaction(s): Delirium Other reaction(s): Delirium   Propoxyphene Other (See Comments)   Dizziness, "Makes me feel drunk" Other reaction(s): Dizziness Other reaction(s): Dizziness   Valsartan Other (See Comments)   incoherent Other reaction(s): Delirium Other reaction(s): Delirium        Medication List     STOP taking these medications    aspirin EC 81 MG tablet   B-D UF III MINI PEN NEEDLES 31G X 5 MM Misc Generic drug: Insulin Pen Needle   doxycycline 100 MG tablet Commonly known as: VIBRA-TABS   Lantus 100 UNIT/ML injection Generic drug: insulin glargine   losartan 50 MG tablet Commonly known as: COZAAR   metoprolol succinate 50 MG 24 hr tablet Commonly known as: TOPROL-XL   Torsemide 40 MG Tabs   traMADol 50 MG tablet Commonly known as: ULTRAM       TAKE these medications    acetaminophen 500 MG tablet Commonly known as: TYLENOL Take 500 mg by mouth 2 (two) times daily as needed for moderate pain.   Alcohol Pads 70 % Pads USE 1 PAD AS DIRECTED   atorvastatin 40 MG tablet Commonly known as: LIPITOR Take 1 tablet (40 mg total) by mouth at bedtime.   cephALEXin 500 MG capsule Commonly known as: KEFLEX Take 1 capsule (500 mg total) by mouth 3 (three) times daily for 10 days.   clopidogrel 75 MG tablet Commonly known as: Plavix Take 1 tablet (75 mg total) by  mouth daily.   donepezil 10 MG tablet Commonly known as: ARICEPT Take 10 mg by mouth at bedtime.   feeding supplement (ENSURE COMPLETE) Liqd Take 237 mLs by mouth 2 (two) times daily between meals.   ferrous sulfate 325 (65 FE) MG tablet Take 325 mg by mouth daily with breakfast.   gabapentin 100 MG capsule Commonly known as: NEURONTIN Take 1 capsule (100 mg total) by mouth 2 (two) times daily.   Metoprolol Tartrate 37.5 MG Tabs Take 37.5 mg by mouth 3 (three) times daily.   metroNIDAZOLE 500 MG tablet Commonly known as: Flagyl Take 1 tablet (500 mg total) by mouth 3 (three) times daily for 10 days.   mirtazapine 7.5 MG tablet Commonly known as: REMERON Take 1 tablet (7.5 mg total) by mouth at bedtime. What changed:  medication strength See the new instructions.   multivitamin with minerals tablet Take 1 tablet by mouth daily.   omeprazole 20 MG capsule Commonly known as: PRILOSEC Take 1 capsule by mouth daily.   ondansetron 4 MG tablet Commonly known as: ZOFRAN Take 1 tablet (4 mg  total) by mouth every 6 (six) hours as needed for nausea.   oxyCODONE-acetaminophen 5-325 MG tablet Commonly known as: Percocet Take 1 tablet by mouth every 4 (four) hours as needed for severe pain.   risperiDONE 0.5 MG tablet Commonly known as: RISPERDAL Take 1 tablet (0.5 mg total) by mouth every 12 (twelve) hours as needed (Restlessness and agitation).   Senexon-S 8.6-50 MG tablet Generic drug: senna-docusate TAKE 2 TABLETS BY MOUTH DAILY AS NEEDED FOR CONSTIPATION               Discharge Care Instructions  (From admission, onward)           Start     Ordered   09/15/21 0000  Discharge wound care:       Comments: See above   09/15/21 [place] Follow up.   Why: Home Hospice referral Contact information: [address] [place] [address] [phone]                Discharge  Exam: Danley Danker Weights   09/11/21 0400 09/14/21 0421 09/15/21 0500  Weight: 51.4 kg 52.9 kg 54.1 kg   General; NAD  Condition at discharge: poor  The results of significant diagnostics from this hospitalization (including imaging, microbiology, ancillary and laboratory) are listed below for reference.   Imaging Studies: US RENAL  Result Date: 09/06/2021 CLINICAL DATA:  AKI EXAM: RENAL / URINARY TRACT ULTRASOUND COMPLETE COMPARISON:  July 20, 2021 FINDINGS: Right Kidney: Renal measurements: 10.5 x 5.1 x 5.3 cm = volume: 146.7 mL. Echogenicity within normal limits and no mass or hydronephrosis visualized. Left Kidney: Renal measurements: 9.2 x 6.1 x 6.0 cm = volume: 178.6 mL. Echogenicity within normal limits. Redemonstrated simple cyst in the superior pole of the left kidney measuring 1.8 x 1.6 by 1.4 cm, unchanged from prior. No hydronephrosis visualized. Bladder: Decompressed with Foley catheter in place. Other: None. IMPRESSION: No acute findings. Electronically Signed   By: Beryle Flock M.D.   On: 09/06/2021 17:00   DG Foot Complete Right  Result Date: 09/06/2021 CLINICAL DATA:  Recent amputation RIGHT foot, redness at foot and lower leg, diabetes mellitus, question necrotizing fasciitis or osteomyelitis EXAM: RIGHT FOOT COMPLETE - 3+ VIEW COMPARISON:  None FINDINGS: Prior transmetatarsal amputation with scattered soft tissue swelling. Soft tissue swelling at amputation bed. Multiple foci of soft tissue gas at the amputation bed and at medial margin of foot adjacent to the medial cuneiform, could be related to an open wound or infection by a gas-forming organism. Scattered atherosclerotic calcifications. No fracture, dislocation, or bone destruction. IMPRESSION: Soft tissue swelling RIGHT foot greatest at amputation bed post transmetatarsal amputation. Foci of soft tissue gas at the amputation bed and medial to the medial cuneiform, question related to open wound or infection by gas-forming  organism. Findings called to Dr. Matilde Sprang On 09/06/2021 at 1106 hrs. Electronically Signed   By: Lavonia Dana M.D.   On: 09/06/2021 11:07   DG Tibia/Fibula Right  Result Date: 09/06/2021 CLINICAL DATA:  Post recent RIGHT partial foot amputation, redness of foot and lower leg, diabetes mellitus, concern for necrotizing fasciitis or osteomyelitis EXAM: RIGHT TIBIA AND FIBULA - 2 VIEW COMPARISON:  None available FINDINGS: Osseous demineralization. Knee and ankle joint alignments normal. No fracture, dislocation or bone destruction. Skin clips at anterior lower leg. Extensive atherosclerotic calcifications with vascular stents at popliteal artery. IMPRESSION: No acute  osseous abnormalities. Electronically Signed   By: Lavonia Dana M.D.   On: 09/06/2021 11:00   DG Chest Port 1 View  Result Date: 09/06/2021 CLINICAL DATA:  Weakness.  Possible sepsis. EXAM: PORTABLE CHEST 1 VIEW COMPARISON:  08/16/2021 FINDINGS: The lungs are clear without focal pneumonia, edema, pneumothorax or pleural effusion. The cardiopericardial silhouette is within normal limits for size. The visualized bony structures of the thorax are unremarkable. Telemetry leads overlie the chest. IMPRESSION: Stable.  No acute findings. Electronically Signed   By: Misty Stanley M.D.   On: 09/06/2021 10:11    Microbiology: Results for orders placed or performed during the hospital encounter of 09/06/21  Blood Culture (routine x 2)     Status: None   Collection Time: 09/06/21  9:29 AM   Specimen: Right Antecubital; Blood  Result Value Ref Range Status   Specimen Description RIGHT ANTECUBITAL  Final   Special Requests   Final    BOTTLES DRAWN AEROBIC AND ANAEROBIC Blood Culture adequate volume   Culture   Final    NO GROWTH 5 DAYS Performed at [facility], [address], [place], [place] [address]    Report Status 09/11/2021 FINAL  Final  Blood Culture (routine x 2)     Status: None   Collection Time: 09/06/21  9:34 AM   Specimen: Left  Antecubital; Blood  Result Value Ref Range Status   Specimen Description LEFT ANTECUBITAL  Final   Special Requests   Final    BOTTLES DRAWN AEROBIC AND ANAEROBIC Blood Culture results may not be optimal due to an inadequate volume of blood received in culture bottles   Culture   Final    NO GROWTH 5 DAYS Performed at [facility], [address], [place], [place] [address]    Report Status 09/11/2021 FINAL  Final  Urine Culture     Status: Abnormal   Collection Time: 09/06/21 10:22 AM   Specimen: In/Out Cath Urine  Result Value Ref Range Status   Specimen Description   Final    IN/OUT CATH URINE Performed at [facility], [address], [place], [place] [address]    Special Requests   Final    NONE Performed at [facility], [address] [place], [place] [address]    Culture (A)  Final    >=100,000 COLONIES/mL PSEUDOMONAS AERUGINOSA >=100,000 COLONIES/mL ENTEROCOCCUS FAECALIS    Report Status 09/09/2021 FINAL  Final   Organism ID, Bacteria PSEUDOMONAS AERUGINOSA (A)  Final   Organism ID, Bacteria ENTEROCOCCUS FAECALIS (A)  Final      Susceptibility   Enterococcus faecalis - MIC*    AMPICILLIN <=2 SENSITIVE Sensitive     NITROFURANTOIN <=16 SENSITIVE Sensitive     VANCOMYCIN 1 SENSITIVE Sensitive     * >=100,000 COLONIES/mL ENTEROCOCCUS FAECALIS   Pseudomonas aeruginosa - MIC*    CEFTAZIDIME 16 INTERMEDIATE Intermediate     CIPROFLOXACIN 1 INTERMEDIATE Intermediate     GENTAMICIN 4 SENSITIVE Sensitive     IMIPENEM 2 SENSITIVE Sensitive     * >=100,000 COLONIES/mL PSEUDOMONAS AERUGINOSA    Labs: CBC: Recent Labs  Lab 09/12/21 0724 09/13/21 0828 09/14/21 0300  WBC 20.5* 17.9* 19.1*  NEUTROABS 17.8* 15.2* 16.4*  HGB 9.3* 9.2* 9.1*  HCT 28.1* 27.3* 26.3*  MCV 94.6 92.5 92.0  PLT 285 246 322   Basic Metabolic Panel: Recent Labs  Lab 09/12/21 0724 09/13/21 0828  NA 140 139  K 3.4* 3.1*  CL 110 113*  CO2 17* 18*  GLUCOSE 189* 89  BUN 52* 40*  CREATININE  2.30* 2.02*  CALCIUM 8.8* 8.7*   Liver Function Tests: No results for input(s): "AST", "ALT", "ALKPHOS", "BILITOT", "PROT", "ALBUMIN" in the last 168 hours. CBG: Recent Labs  Lab 09/14/21 1322 09/14/21 1641 09/14/21 2051 09/15/21 0006 09/15/21 0837  GLUCAP 122* 119* 123* 112* 117*    Discharge time spent: greater than 30 minutes.  Signed: Elmarie Shiley, MD [facility] 09/18/2021

## 2021-09-22 DIAGNOSIS — N399 Disorder of urinary system, unspecified: Secondary | ICD-10-CM | POA: Diagnosis not present

## 2021-09-22 DIAGNOSIS — F209 Schizophrenia, unspecified: Secondary | ICD-10-CM | POA: Diagnosis not present

## 2021-09-22 DIAGNOSIS — E43 Unspecified severe protein-calorie malnutrition: Secondary | ICD-10-CM | POA: Diagnosis not present

## 2021-09-22 DIAGNOSIS — I739 Peripheral vascular disease, unspecified: Secondary | ICD-10-CM | POA: Diagnosis not present

## 2021-09-22 DIAGNOSIS — F4310 Post-traumatic stress disorder, unspecified: Secondary | ICD-10-CM | POA: Diagnosis not present

## 2021-09-22 DIAGNOSIS — D509 Iron deficiency anemia, unspecified: Secondary | ICD-10-CM | POA: Diagnosis not present

## 2021-09-22 DIAGNOSIS — I10 Essential (primary) hypertension: Secondary | ICD-10-CM | POA: Diagnosis not present

## 2021-09-22 DIAGNOSIS — K219 Gastro-esophageal reflux disease without esophagitis: Secondary | ICD-10-CM | POA: Diagnosis not present

## 2021-09-22 DIAGNOSIS — I255 Ischemic cardiomyopathy: Secondary | ICD-10-CM | POA: Diagnosis not present

## 2021-09-22 DIAGNOSIS — I480 Paroxysmal atrial fibrillation: Secondary | ICD-10-CM | POA: Diagnosis not present

## 2021-09-22 DIAGNOSIS — I96 Gangrene, not elsewhere classified: Secondary | ICD-10-CM | POA: Diagnosis not present

## 2021-09-22 DIAGNOSIS — A419 Sepsis, unspecified organism: Secondary | ICD-10-CM | POA: Diagnosis not present

## 2021-09-22 DIAGNOSIS — Z720 Tobacco use: Secondary | ICD-10-CM | POA: Diagnosis not present

## 2021-09-28 ENCOUNTER — Ambulatory Visit (INDEPENDENT_AMBULATORY_CARE_PROVIDER_SITE_OTHER): Payer: Medicare Other | Admitting: Podiatry

## 2021-09-28 ENCOUNTER — Encounter: Payer: TRICARE For Life (TFL) | Admitting: Podiatry

## 2021-09-28 DIAGNOSIS — I739 Peripheral vascular disease, unspecified: Secondary | ICD-10-CM

## 2021-09-28 DIAGNOSIS — I96 Gangrene, not elsewhere classified: Secondary | ICD-10-CM

## 2021-09-28 MED ORDER — DOXYCYCLINE HYCLATE 100 MG PO TABS: 100.0000 mg | ORAL_TABLET | Freq: Two times a day (BID) | ORAL | 0 refills | Status: AC

## 2021-10-02 NOTE — Progress Notes (Signed)
  Subjective:  Patient ID: Allen Yu, male    DOB: 1945/11/21,  MRN: [id number]  Chief Complaint  Patient presents with   gangrene of right foot    DRESSING CHANGE  DOS 08/17/2021 TRANSMETATARSAL AMPUTATION RT FOOT, DEBRIDEMENT OF LEFT LEG ULCER     76 y.o. male returns for follow-up of right foot gangrene, they were unable to do the amputation because his A-fib was uncontrolled  Review of Systems: Negative except as noted in the HPI. Denies N/V/F/Ch.   Objective:  There were no vitals filed for this visit. There is no height or weight on file to calculate BMI. Constitutional Well developed. Well nourished.  Vascular Foot warm and well perfused. Capillary refill normal to all digits.  Calf is soft and supple, no posterior calf or knee pain, negative Homans' sign  Neurologic Normal speech. Oriented to person, place, and time. Epicritic sensation to light touch grossly present bilaterally.  Dermatologic Continue necrosis of right foot and graft site  Orthopedic: Little to no tenderness to palpation noted about the surgical site.    Assessment:   1. Gangrene of right foot ([place])   2. PAD (peripheral artery disease) ([place])    Plan:  Patient was evaluated and treated and all questions answered.  S/p foot surgery right I recommended the patient and his wife that they follow-up with their cardiologist as well as vascular surgery for amputation.  I discussed in the interim I would recommend keeping him on antibiotics, doxycycline Rx sent to pharmacy.  They will change at home with Betadine.  Return to see me as needed Return if symptoms worsen or fail to improve.

## 2021-10-04 NOTE — Progress Notes (Deleted)
Office Visit    Allen Yu Name: Allen Yu Date of Encounter: 10/04/2021  Primary Care Provider:  [name], MD Primary Cardiologist:  Rozann Lesches, MD Primary Electrophysiologist: None  Chief Complaint    Allen Yu is a 76 y.o. 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  Past Medical History    Past Medical History:  Diagnosis Date   Anemia    Arthritis    Carotid stenosis    Chronic back pain    Chronic HFrEF (heart failure with reduced ejection fraction) ([place])    a. 09/2020 Echo: EF 45%, mild LVH; b. 12/2020 Echo: EF 25-30%, glob HK, mod LVH, mildly reduced RV fxn, RVSP 65.5mHg, mod BAE. Triv effusion. Mod MR/TR.   CKD (chronic kidney disease), stage III ([place]    Constipation    Dementia ([place]    Diabetes mellitus    Dilated cardiomyopathy ([place]    a. 09/2020 Echo: EF 45%; b. 12/2020 Echo: EF 25-30%.   Foot drop, right    GERD (gastroesophageal reflux disease)    History of kidney stones    Hypertension    Lung nodule    PAF (paroxysmal atrial fibrillation) (HCC)    Peripheral neuropathy    Peripheral vascular disease ([place]    a. 06/2020 s/p L AKA; b. 10/2020 s/p R SFA and above-knee popliteal PTA.   PTSD (post-traumatic stress disorder)    Schizophrenia ([place]    Tuberculosis    Treated   Past Surgical History:  Procedure Laterality Date   ABDOMINAL AORTOGRAM W/LOWER EXTREMITY N/A 05/20/2020   Procedure: ABDOMINAL AORTOGRAM W/LOWER EXTREMITY;  Surgeon: FElam Dutch MD;  [facility];  Service: Cardiovascular;  Laterality: N/A;   ABDOMINAL AORTOGRAM W/LOWER EXTREMITY N/A 10/27/2020   Procedure: ABDOMINAL AORTOGRAM W/LOWER EXTREMITY;  Surgeon: CMarty Heck MD;  [facility];  Service: Cardiovascular;  Laterality: N/A;   ABDOMINAL AORTOGRAM W/LOWER EXTREMITY Right 07/26/2021   Procedure: ABDOMINAL  AORTOGRAM W/LOWER EXTREMITY;  Surgeon: RBroadus Miracle MD;  [facility];  Service: Cardiovascular;  Laterality: Right;   AMPUTATION Left 06/28/2020   Procedure: AMPUTATION ABOVE KNEE LEFT;  Surgeon: ERosetta Posner MD;  [place] [facility]  Service: Vascular;  Laterality: Left;   [place] 2000, 2013   x2   BLADDER SURGERY     02   CATARACT EXTRACTION W/PHACO Right 05/12/2021   Procedure: CATARACT EXTRACTION PHACO AND INTRAOCULAR LENS PLACEMENT ([place];  Surgeon: WBaruch Goldmann MD;  [facility];  Service: Ophthalmology;  Laterality: Right;  CDE: 27.28   CATARACT EXTRACTION W/PHACO Left 05/29/2021   Procedure: CATARACT EXTRACTION PHACO AND INTRAOCULAR LENS PLACEMENT (IOC);  Surgeon: WBaruch Goldmann MD;  Location: [facility];  Service: Ophthalmology;  Laterality: Left;  CDE  12.96   CHOLECYSTECTOMY     COLONOSCOPY     COLONOSCOPY N/A 12/07/2014   Procedure: COLONOSCOPY;  Surgeon: RDaneil Dolin MD;  [facility];  Service: Endoscopy;  Laterality: N/A;  10:30 Am   ENDOVASCULAR REPAIR/STENT GRAFT Right 07/26/2021   Procedure: ENDOVASCULAR REPAIR/STENT GRAFT;  Surgeon: RBroadus Ines MD;  [facility];  Service: Cardiovascular;  Laterality: Right;  SFA   EYE SURGERY Bilateral    removed metal from eye   FEMORAL-TIBIAL BYPASS GRAFT Left 05/27/2020   Procedure:  LEFT FEMORAL TO PERONEAL ARTERY BYPASS;  Surgeon: Rosetta Posner, MD;  Location: [facility];  Service: Vascular;  Laterality: Left;   HERNIA REPAIR Right    INGUINAL HERNIA REPAIR Left 11/03/2013   Procedure: HERNIA REPAIR INGUINAL ADULT;  Surgeon: Gayland Curry, MD;  Location: [facility];  Service: General;  Laterality: Left;   IRRIGATION AND DEBRIDEMENT FOOT Right 08/17/2021   Procedure: IRRIGATION AND DEBRIDEMENT ULCER RIGHT LEG APPLICATION SKIN SUBSTITUTE;  Surgeon: Criselda Peaches, DPM;  [facility]: [facility];  Service: Podiatry;  Laterality: Right;   PERIPHERAL VASCULAR BALLOON ANGIOPLASTY  Right 07/26/2021   Procedure: PERIPHERAL VASCULAR BALLOON ANGIOPLASTY;  Surgeon: Broadus Haston, MD;  Location: [facility];  Service: Cardiovascular;  Laterality: Right;  PERONEAL   PERIPHERAL VASCULAR INTERVENTION  10/27/2020   Procedure: PERIPHERAL VASCULAR INTERVENTION;  Surgeon: Marty Heck, MD;  Location: [facility];  Service: Cardiovascular;;   SHOULDER SURGERY     RIGHT SHOULDER    TRANSMETATARSAL AMPUTATION Right 08/17/2021   Procedure: TRANSMETATARSAL AMPUTATION;  Surgeon: Criselda Peaches, DPM;  [facility];  Service: Podiatry;  Laterality: Right;   VIDEO BRONCHOSCOPY WITH ENDOBRONCHIAL NAVIGATION N/A 07/10/2019   Procedure: VIDEO BRONCHOSCOPY WITH ENDOBRONCHIAL NAVIGATION;  Surgeon: Melrose Nakayama, MD;  Location: [name];  Service: Thoracic;  Laterality: N/A;    Allergies  Allergies  Allergen Reactions   Bee Venom Anaphylaxis   Codeine Other (See Comments)    incoherent  Other reaction(s): Delirium Other reaction(s): Delirium   Propoxyphene Other (See Comments)    Dizziness, "Makes me feel drunk" Other reaction(s): Dizziness Other reaction(s): Dizziness   Valsartan Other (See Comments)    incoherent Other reaction(s): Delirium Other reaction(s): Delirium    History of Present Illness    Allen Yu has a PMH of is a 76 year old male with the above mention past medical history who presents today for posthospital follow-up.  Allen Yu was initially seen by Dr. Domenic Polite in 07/2020 for treatment of paroxysmal atrial fibrillation.  Allen Yu was admitted for metabolic encephalopathy during consultation.  He was also suffering acute on chronic renal failure.  Allen Yu has left AKA from significant PAD due to poor healing left heel ulceration.  Allen Yu developed rapid AF with RVR during hospitalization and was treated with IV amiodarone and converted to sinus rhythm.  He was transitioned to p.o. amiodarone 200 mg on discharge and was also  continued on Lopressor 25 mg twice daily.  Allen Yu was not started on anticoagulation due to multiple medical issues but was started on Eliquis 2.5 mg by hospitalist prior to discharge.  Allen Yu's hemoglobin was 6.7 and Eliquis was stopped during follow-up visit.  He still was readmitted to any pain on 9/3 to 9/8 for sepsis in the setting of pneumonia and epididymitis.  2D echo was completed that showed EF reduced at 45% with inferior hypokinesis.  Allen Yu was discharged required repeat vascular intervention with right SFA and above-the-knee popliteal PTA was performed by Dr. Carlis Abbott.  He was readmitted 01/17/2021 due to shortness of breath and hypoxia.  He was found to have elevated lactic acid greater than 6 with creatinine elevated at 2.21. His BNP was noted to be 2873.  2D echo was completed that showed EF of 25-30% with global hypokinesis, moderate LVH, RVSP of 65.7 mmHg, moderately dilated atria, and moderate MR/TR. Allen Yu's wife requested conservative approach due to multiple medical issues.  Allen Yu was diuresed with Lasix 6 L and was transitioned to torsemide  on discharge.    Allen Yu was admitted 8/16 with sepsis due to right lower extremity gangrene.  Allen Yu's hospital course was complicated by GI bleed that required multiple blood transfusions and persistent tachycardia in the 120s.  Allen Yu was started back on Plavix but per GI was not a candidate for DOAC.  Allen Yu was also deemed not a candidate for AAD due to conversion and not being on anticoagulation.  Vascular surgery elected not to perform amputation due to Allen Yu not being in rate control.  Metoprolol was adjusted for possible rate control.  Allen Yu was consulted by palliative care and elected to have hospice treatment at home.  He was discharged 8/25  Since last being seen in the office Allen Yu reports***.  Allen Yu denies chest pain, palpitations, dyspnea, PND, orthopnea, nausea, vomiting, dizziness, syncope, edema, weight gain, or  early satiety.   ***Notes: -Allen Yu has home hospice referral -Sitting GI bleed not on anticoagulation -Allen Yu needs to have rate controlled prior to AKA for sepsis and gangrene  Home Medications    Current Outpatient Medications  Medication Sig Dispense Refill   acetaminophen (TYLENOL) 500 MG tablet Take 500 mg by mouth 2 (two) times daily as needed for moderate pain.     Alcohol Swabs (ALCOHOL PADS) 70 % PADS USE 1 PAD AS DIRECTED     atorvastatin (LIPITOR) 40 MG tablet Take 1 tablet (40 mg total) by mouth at bedtime. 90 tablet 0   clopidogrel (PLAVIX) 75 MG tablet Take 1 tablet (75 mg total) by mouth daily. 90 tablet 3   donepezil (ARICEPT) 10 MG tablet Take 10 mg by mouth at bedtime.     doxycycline (VIBRA-TABS) 100 MG tablet Take 1 tablet (100 mg total) by mouth 2 (two) times daily for 28 days. 56 tablet 0   feeding supplement, ENSURE COMPLETE, (ENSURE COMPLETE) LIQD Take 237 mLs by mouth 2 (two) times daily between meals. 23700 mL 1   ferrous sulfate 325 (65 FE) MG tablet Take 325 mg by mouth daily with breakfast.     gabapentin (NEURONTIN) 100 MG capsule Take 1 capsule (100 mg total) by mouth 2 (two) times daily. 60 capsule 3   Metoprolol Tartrate 37.5 MG TABS Take 37.5 mg by mouth 3 (three) times daily. 90 tablet 1   mirtazapine (REMERON) 7.5 MG tablet Take 1 tablet (7.5 mg total) by mouth at bedtime. 30 tablet 0   Multiple Vitamins-Minerals (MULTIVITAMIN WITH MINERALS) tablet Take 1 tablet by mouth daily. 120 tablet 2   omeprazole (PRILOSEC) 20 MG capsule Take 1 capsule by mouth daily.     ondansetron (ZOFRAN) 4 MG tablet Take 1 tablet (4 mg total) by mouth every 6 (six) hours as needed for nausea. 20 tablet 0   oxyCODONE-acetaminophen (PERCOCET) 5-325 MG tablet Take 1 tablet by mouth every 4 (four) hours as needed for severe pain. 20 tablet 0   risperiDONE (RISPERDAL) 0.5 MG tablet Take 1 tablet (0.5 mg total) by mouth every 12 (twelve) hours as needed (Restlessness and  agitation). 60 tablet 1   senna-docusate (SENEXON-S) 8.6-50 MG tablet TAKE 2 TABLETS BY MOUTH DAILY AS NEEDED FOR CONSTIPATION     No current facility-administered medications for this visit.     Review of Systems  Please see the history of present illness.    (+)*** (+)***  All other systems reviewed and are otherwise negative except as noted above.  Physical Exam    Wt Readings from Last 3 Encounters:  09/15/21 119 lb 4.3 oz (54.1 kg)  08/17/21 116 lb 13.5 oz (53 kg)  07/26/21 110 lb (49.9 kg)   [id number] were no vitals filed for this visit.,There is no height or weight on file to calculate BMI.  Constitutional:      Appearance: Healthy appearance. Not in distress.  Neck:     Vascular: JVD normal.  Pulmonary:     Effort: Pulmonary effort is normal.     Breath sounds: No wheezing. No rales. Diminished in the bases Cardiovascular:     Normal rate. Regular rhythm. Normal S1. Normal S2.      Murmurs: There is no murmur.  Edema:    Peripheral edema absent.  Abdominal:     Palpations: Abdomen is soft non tender. There is no hepatomegaly.  Skin:    General: Skin is warm and dry.  Neurological:     General: No focal deficit present.     Mental Status: Alert and oriented to person, place and time.     Cranial Nerves: Cranial nerves are intact.  EKG/LABS/Other Studies Reviewed    ECG personally reviewed by me today - ***  Risk Assessment/Calculations:   {Does this Allen Yu have ATRIAL FIBRILLATION?:[phone]}        Lab Results  Component Value Date   WBC 19.1 (H) 09/14/2021   HGB 9.1 (L) 09/14/2021   HCT 26.3 (L) 09/14/2021   MCV 92.0 09/14/2021   PLT 230 09/14/2021   Lab Results  Component Value Date   CREATININE 2.02 (H) 09/13/2021   BUN 40 (H) 09/13/2021   NA 139 09/13/2021   K 3.1 (L) 09/13/2021   CL 113 (H) 09/13/2021   CO2 18 (L) 09/13/2021   Lab Results  Component Value Date   ALT 13 09/08/2021   AST 22 09/08/2021   ALKPHOS 87 09/08/2021    BILITOT 0.8 09/08/2021   Lab Results  Component Value Date   CHOL 114 05/28/2020   HDL 41 05/28/2020   LDLCALC 63 05/28/2020   TRIG 49 05/28/2020   CHOLHDL 2.8 05/28/2020    Lab Results  Component Value Date   HGBA1C 6.0 (H) 07/04/2021    Assessment & Plan    1.  Paroxysmal atrial flutter: -Allen Yu currently not rate controlled on metoprolol 37.5 3 times daily -Allen Yu not on anticoagulation due to low hemoglobin and chronic GI bleed   2.  Essential hypertension: -Blood pressure today was***  3.  HFrEF: -Most recent 2D echo completed with EF of 25-30% with global hypokinesis, moderate LVH, RVSP of 65.7 mmHg, moderately dilated atria, and moderate MR/TR  4.  DM type II: -Continue current treatment plan per PCP      Disposition: Follow-up with Rozann Lesches, MD or APP in *** months {Are you ordering a CV Procedure (e.g. stress test, cath, DCCV, TEE, etc)?   Press F2        :[id number]}   Medication Adjustments/Labs and Tests Ordered: Current medicines are reviewed at length with the Allen Yu today.  Concerns regarding medicines are outlined above.   Signed, Mable Fill, Marissa Nestle, NP 10/04/2021, 12:53 PM [place]

## 2021-10-05 ENCOUNTER — Ambulatory Visit: Payer: Medicare Other | Admitting: Nurse Practitioner

## 2021-10-05 DIAGNOSIS — I480 Paroxysmal atrial fibrillation: Secondary | ICD-10-CM

## 2021-10-08 NOTE — Progress Notes (Unsigned)
Office Visit    Patient Name: Allen Yu Date of Encounter: 10/10/2021  Primary Care Provider:  [name], MD Primary Cardiologist:  Rozann Lesches, MD Primary Electrophysiologist: None  Chief Complaint    Allen Yu is a 76 y.o. 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  Past Medical History    Past Medical History:  Diagnosis Date   Anemia    Arthritis    Carotid stenosis    Chronic back pain    Chronic HFrEF (heart failure with reduced ejection fraction) ([place])    a. 09/2020 Echo: EF 45%, mild LVH; b. 12/2020 Echo: EF 25-30%, glob HK, mod LVH, mildly reduced RV fxn, RVSP 65.51mHg, mod BAE. Triv effusion. Mod MR/TR.   CKD (chronic kidney disease), stage III ([facility]    Constipation    Dementia ([facility]    Diabetes mellitus    Dilated cardiomyopathy ([facility]    a. 09/2020 Echo: EF 45%; b. 12/2020 Echo: EF 25-30%.   Foot drop, right    GERD (gastroesophageal reflux disease)    History of kidney stones    Hypertension    Lung nodule    PAF (paroxysmal atrial fibrillation) (HCC)    Peripheral neuropathy    Peripheral vascular disease ([facility]    a. 06/2020 s/p L AKA; b. 10/2020 s/p R SFA and above-knee popliteal PTA.   PTSD (post-traumatic stress disorder)    Schizophrenia ([facility]    Tuberculosis    Treated   Past Surgical History:  Procedure Laterality Date   ABDOMINAL AORTOGRAM W/LOWER EXTREMITY N/A 05/20/2020   Procedure: ABDOMINAL AORTOGRAM W/LOWER EXTREMITY;  Surgeon: FElam Dutch MD;  [facility];  Service: Cardiovascular;  Laterality: N/A;   ABDOMINAL AORTOGRAM W/LOWER EXTREMITY N/A 10/27/2020   Procedure: ABDOMINAL AORTOGRAM W/LOWER EXTREMITY;  Surgeon: CMarty Heck MD;  [facility];  Service: Cardiovascular;  Laterality: N/A;   ABDOMINAL AORTOGRAM W/LOWER EXTREMITY Right 07/26/2021   Procedure: ABDOMINAL  AORTOGRAM W/LOWER EXTREMITY;  Surgeon: RBroadus Jovanne MD;  [facility];  Service: Cardiovascular;  Laterality: Right;   AMPUTATION Left 06/28/2020   Procedure: AMPUTATION ABOVE KNEE LEFT;  Surgeon: ERosetta Posner MD;  [place] [facility]  Service: Vascular;  Laterality: Left;   [place] 2000, 2013   x2   BLADDER SURGERY     02   CATARACT EXTRACTION W/PHACO Right 05/12/2021   Procedure: CATARACT EXTRACTION PHACO AND INTRAOCULAR LENS PLACEMENT ([place];  Surgeon: WBaruch Goldmann MD;  Location: [facility];  Service: Ophthalmology;  Laterality: Right;  CDE: 27.28   CATARACT EXTRACTION W/PHACO Left 05/29/2021   Procedure: CATARACT EXTRACTION PHACO AND INTRAOCULAR LENS PLACEMENT ([facility]);  Surgeon: WBaruch Goldmann MD;  Location: [facility];  Service: Ophthalmology;  Laterality: Left;  CDE  12.96   CHOLECYSTECTOMY     COLONOSCOPY     COLONOSCOPY N/A 12/07/2014   Procedure: COLONOSCOPY;  Surgeon: RDaneil Dolin MD;  [facility];  Service: Endoscopy;  Laterality: N/A;  10:30 Am   ENDOVASCULAR REPAIR/STENT GRAFT Right 07/26/2021   Procedure: ENDOVASCULAR REPAIR/STENT GRAFT;  Surgeon: RBroadus Alecxis MD;  [place];  Service: Cardiovascular;  Laterality: Right;  SFA   EYE SURGERY Bilateral    removed metal from eye   FEMORAL-TIBIAL BYPASS GRAFT Left 05/27/2020   Procedure:  LEFT FEMORAL TO PERONEAL ARTERY BYPASS;  Surgeon: Rosetta Posner, MD;  Location: [place];  Service: Vascular;  Laterality: Left;   HERNIA REPAIR Right    INGUINAL HERNIA REPAIR Left 11/03/2013   Procedure: HERNIA REPAIR INGUINAL ADULT;  Surgeon: Gayland Curry, MD;  Location: [place];  Service: General;  Laterality: Left;   IRRIGATION AND DEBRIDEMENT FOOT Right 08/17/2021   Procedure: IRRIGATION AND DEBRIDEMENT ULCER RIGHT LEG APPLICATION SKIN SUBSTITUTE;  Surgeon: Criselda Peaches, DPM;  Location: [facility];  Service: Podiatry;  Laterality: Right;   PERIPHERAL VASCULAR BALLOON ANGIOPLASTY  Right 07/26/2021   Procedure: PERIPHERAL VASCULAR BALLOON ANGIOPLASTY;  Surgeon: Broadus Zannie, MD;  Location: [place] [facility];  Service: Cardiovascular;  Laterality: Right;  PERONEAL   PERIPHERAL VASCULAR INTERVENTION  10/27/2020   Procedure: PERIPHERAL VASCULAR INTERVENTION;  Surgeon: Marty Heck, MD;  Location: [place] [facility];  Service: Cardiovascular;;   SHOULDER SURGERY     RIGHT SHOULDER    TRANSMETATARSAL AMPUTATION Right 08/17/2021   Procedure: TRANSMETATARSAL AMPUTATION;  Surgeon: Criselda Peaches, DPM;  [facility];  Service: Podiatry;  Laterality: Right;   VIDEO BRONCHOSCOPY WITH ENDOBRONCHIAL NAVIGATION N/A 07/10/2019   Procedure: VIDEO BRONCHOSCOPY WITH ENDOBRONCHIAL NAVIGATION;  Surgeon: Melrose Nakayama, MD;  Location: [place];  Service: Thoracic;  Laterality: N/A;    Allergies  Allergies  Allergen Reactions   Bee Venom Anaphylaxis   Codeine Other (See Comments)    incoherent  Other reaction(s): Delirium Other reaction(s): Delirium   Propoxyphene Other (See Comments)    Dizziness, "Makes me feel drunk" Other reaction(s): Dizziness Other reaction(s): Dizziness   Valsartan Other (See Comments)    incoherent Other reaction(s): Delirium Other reaction(s): Delirium    History of Present Illness   Allen Yu has a PMH of is a 76 year old male with the above mention past medical history who presents today for posthospital follow-up.  Allen Yu was initially seen by Dr. Domenic Polite in 07/2020 for treatment of paroxysmal atrial fibrillation.  Patient was admitted for metabolic encephalopathy during consultation.  He was also suffering acute on chronic renal failure.  Patient has left AKA from significant PAD due to poor healing left heel ulceration.  Patient developed rapid AF with RVR during hospitalization and was treated with IV amiodarone and converted to sinus rhythm.  He was transitioned to p.o. amiodarone 200 mg on discharge and was also continued  on Lopressor 25 mg twice daily.  Patient was not started on anticoagulation due to multiple medical issues but was started on Eliquis 2.5 mg by hospitalist prior to discharge.  Patient's hemoglobin was 6.7 and Eliquis was stopped during follow-up visit.   He still was readmitted to any pain on 9/3 to 9/8 for sepsis in the setting of pneumonia and epididymitis.  2D echo was completed that showed EF reduced at 45% with inferior hypokinesis.  Patient was discharged required repeat vascular intervention with right SFA and above-the-knee popliteal PTA was performed by Dr. Carlis Abbott.  He was readmitted 01/17/2021 due to shortness of breath and hypoxia.  He was found to have elevated lactic acid greater than 6 with creatinine elevated at 2.21. His BNP was noted to be 2873.  2D echo was completed that showed EF of 25-30% with global hypokinesis, moderate LVH, RVSP of 65.7 mmHg, moderately dilated atria, and moderate MR/TR. patient's wife requested conservative approach due to multiple medical issues.  Patient was diuresed with Lasix 6 L and was transitioned to torsemide  on discharge.     Allen Yu was admitted 8/16 with sepsis due to right lower extremity gangrene.  Patient's hospital course was complicated by GI bleed that required multiple blood transfusions and persistent tachycardia in the 120s.  Patient was started back on Plavix but per GI was not a candidate for DOAC.  Patient was also deemed not a candidate for AAD due to conversion and not being on anticoagulation.  Vascular surgery elected not to perform amputation due to patient not being in rate control.  Metoprolol was adjusted for possible rate control.  Patient was consulted by palliative care and elected to have hospice treatment at home.  He was discharged 8/25   Allen Yu presents today with his wife for preoperative clearance.  Since last being seen in the office patient reports that he has not had any occult bleeding and has been able to tolerate  his current medication regimen.  Blood pressure today was 102/58 and heart rate is 73.  He has had no complaints of shortness of breath or volume related concerns.  Patient's EKG today  shows atrial fibrillation with controlled rate.  Patient denies chest pain, palpitations, dyspnea, PND, orthopnea, nausea, vomiting, dizziness, syncope, edema, weight gain, or early satiety.  Home Medications    Current Outpatient Medications  Medication Sig Dispense Refill   acetaminophen (TYLENOL) 500 MG tablet Take 500 mg by mouth 2 (two) times daily as needed for moderate pain.     Alcohol Swabs (ALCOHOL PADS) 70 % PADS USE 1 PAD AS DIRECTED     atorvastatin (LIPITOR) 40 MG tablet Take 1 tablet (40 mg total) by mouth at bedtime. 90 tablet 0   clopidogrel (PLAVIX) 75 MG tablet Take 1 tablet (75 mg total) by mouth daily. 90 tablet 3   donepezil (ARICEPT) 10 MG tablet Take 10 mg by mouth at bedtime.     doxycycline (VIBRA-TABS) 100 MG tablet Take 1 tablet (100 mg total) by mouth 2 (two) times daily for 28 days. 56 tablet 0   feeding supplement, ENSURE COMPLETE, (ENSURE COMPLETE) LIQD Take 237 mLs by mouth 2 (two) times daily between meals. 23700 mL 1   ferrous sulfate 325 (65 FE) MG tablet Take 325 mg by mouth daily with breakfast.     gabapentin (NEURONTIN) 100 MG capsule Take 1 capsule (100 mg total) by mouth 2 (two) times daily. 60 capsule 3   Metoprolol Tartrate 37.5 MG TABS Take 37.5 mg by mouth 3 (three) times daily. 90 tablet 1   mirtazapine (REMERON) 7.5 MG tablet Take 1 tablet (7.5 mg total) by mouth at bedtime. 30 tablet 0   Multiple Vitamins-Minerals (MULTIVITAMIN WITH MINERALS) tablet Take 1 tablet by mouth daily. 120 tablet 2   omeprazole (PRILOSEC) 20 MG capsule Take 1 capsule by mouth daily.     ondansetron (ZOFRAN) 4 MG tablet Take 1 tablet (4 mg total) by mouth every 6 (six) hours as needed for nausea. 20 tablet 0   oxyCODONE-acetaminophen (PERCOCET) 5-325 MG tablet Take 1 tablet by mouth every 4  (four) hours as needed for severe pain. 20 tablet 0   risperiDONE (RISPERDAL) 0.5 MG tablet Take 1 tablet (0.5 mg total) by mouth every 12 (twelve) hours as needed (Restlessness and agitation). 60 tablet 1   No current facility-administered medications for this visit.     Review of Systems  Please see the history of present illness.    (+) Right foot pain (+) Weakness  All other systems reviewed and are otherwise  negative except as noted above.  Physical Exam    Wt Readings from Last 3 Encounters:  10/10/21 104 lb (47.2 kg)  09/15/21 119 lb 4.3 oz (54.1 kg)  08/17/21 116 lb 13.5 oz (53 kg)   VS: Vitals:   10/10/21 1115  BP: (!) 102/58  Pulse: 73  SpO2: 99%  ,Body mass index is 13.72 kg/m.  Constitutional:      Appearance: Thin appearance. Not in distress.  Neck:     Vascular: JVD normal.  Pulmonary:     Effort: Pulmonary effort is normal.     Breath sounds: No wheezing. No rales. Diminished in the bases Cardiovascular:     Irregularly irregular normal S1. Normal S2.      Murmurs: There is no murmur.  Edema:    Peripheral edema absent.  Abdominal:     Palpations: Abdomen is soft non tender. There is no hepatomegaly.  Skin:    General: Skin is warm and dry.  Neurological:     General: No focal deficit present.     Mental Status: Alert and oriented to person, place and time.     Cranial Nerves: Cranial nerves are intact.  EKG/LABS/Other Studies Reviewed    ECG personally reviewed by me today -atrial fibrillation with rate of 73 bpm and LVH with no acute changes compared to previous EKG.  Risk Assessment/Calculations:    CHA2DS2-VASc Score = 6   This indicates a 9.7% annual risk of stroke. The patient's score is based upon: CHF History: 1 HTN History: 1 Diabetes History: 1 Stroke History: 0 Vascular Disease History: 1 Age Score: 2 Gender Score: 0     Lab Results  Component Value Date   WBC 19.1 (H) 09/14/2021   HGB 9.1 (L) 09/14/2021   HCT 26.3 (L)  09/14/2021   MCV 92.0 09/14/2021   PLT 230 09/14/2021   Lab Results  Component Value Date   CREATININE 2.02 (H) 09/13/2021   BUN 40 (H) 09/13/2021   NA 139 09/13/2021   K 3.1 (L) 09/13/2021   CL 113 (H) 09/13/2021   CO2 18 (L) 09/13/2021   Lab Results  Component Value Date   ALT 13 09/08/2021   AST 22 09/08/2021   ALKPHOS 87 09/08/2021   BILITOT 0.8 09/08/2021   Lab Results  Component Value Date   CHOL 114 05/28/2020   HDL 41 05/28/2020   LDLCALC 63 05/28/2020   TRIG 49 05/28/2020   CHOLHDL 2.8 05/28/2020    Lab Results  Component Value Date   HGBA1C 6.0 (H) 07/04/2021    Assessment & Plan   1.  Paroxysmal atrial flutter: -Today patient is rate controlled atrial fibrillation with heart rate of 73 -Continue metoprolol 37.5 mg 3 times daily -Patient not on anticoagulation due to low hemoglobin and chronic GI bleed -Patient currently on Plavix 75 mg -Patient denies any palpitations or accelerated rhythms CHA2DS2-VASc Score = 6 [CHF History: 1, HTN History: 1, Diabetes History: 1, Stroke History: 0, Vascular Disease History: 1, Age Score: 2, Gender Score: 0].  Therefore, the patient's annual risk of stroke is 9.7 %.        2.  Essential hypertension: -Blood pressure today was well controlled at 102/58 -Continue stable p.o. intake to maintain blood pressure   3.  HFrEF: -Most recent 2D echo completed with EF of 25-30% with global hypokinesis, moderate LVH, RVSP of 65.7 mmHg, moderately dilated atria, and moderate MR/TR -He is euvolemic on examination and is tolerating current medications without any  adverse reactions   4.  DM type II: -Continue current treatment plan per PCP -Patient's blood sugars have been controlled per wife at home  5.  Surgical clearance:  -Allen Yu has no new cardiac complaints and is currently rate controlled atrial fibrillation.  He is not on anticoagulation due to history of GI bleeding but is taking Plavix 75 mg.  Based on current  criteria and patient's current state of health he is at acceptable risk to proceed with surgical procedure.  Allen Yu perioperative risk of a major cardiac event is 6.6% according to the Revised Cardiac Risk Index (RCRI).  Therefore, he is at high risk for perioperative complications.   His functional capacity is fair at 3.41 METs according to the Duke Activity Status Index (DASI). Recommendations: The patient is at high risk for perioperative cardiac complications and is at a low functional capacity.  However, further testing will not change how his cardiac status is managed.  Proceed with surgery at high risk if there are no other options for treatment. Antiplatelet and/or Anticoagulation Recommendations: Clopidogrel (Plavix) is managed by vascular doctors and will comment on hold times.    Disposition: Follow-up with Rozann Lesches, MD or APP in 3 months     Medication Adjustments/Labs and Tests Ordered: Current medicines are reviewed at length with the patient today.  Concerns regarding medicines are outlined above.   Signed, Mable Fill, Marissa Nestle, NP 10/10/2021, 11:51 AM [place]

## 2021-10-10 ENCOUNTER — Encounter: Payer: Self-pay | Admitting: Nurse Practitioner

## 2021-10-10 ENCOUNTER — Ambulatory Visit: Payer: Medicare Other | Attending: Student | Admitting: Nurse Practitioner

## 2021-10-10 VITALS — BP 102/58 | HR 73 | Ht 73.0 in | Wt 104.0 lb

## 2021-10-10 DIAGNOSIS — I480 Paroxysmal atrial fibrillation: Secondary | ICD-10-CM | POA: Insufficient documentation

## 2021-10-10 DIAGNOSIS — Z01810 Encounter for preprocedural cardiovascular examination: Secondary | ICD-10-CM | POA: Diagnosis not present

## 2021-10-10 NOTE — Patient Instructions (Signed)
Medication Instructions:  Your physician recommends that you continue on your current medications as directed. Please refer to the Current Medication list given to you today.  *If you need a refill on your cardiac medications before your next appointment, please call your pharmacy*   Lab Work: None If you have labs (blood work) drawn today and your tests are completely normal, you will receive your results only by: [facility] (if you have MyChart) OR A paper copy in the mail If you have any lab test that is abnormal or we need to change your treatment, we will call you to review the results.   Follow-Up: At [facility], you and your health needs are our priority.  As part of our continuing mission to provide you with exceptional heart care, we have created designated Provider Care Teams.  These Care Teams include your primary Cardiologist (physician) and Advanced Practice Providers (APPs -  Physician Assistants and Nurse Practitioners) who all work together to provide you with the care you need, when you need it.   Your next appointment:   3 month(s)  The format for your next appointment:   In Person  Provider:   Ambrose Pancoast, NP       Important Information About Sugar

## 2021-10-11 NOTE — Addendum Note (Signed)
Addended by: Drue Novel I on: 10/11/2021 07:02 PM   Modules accepted: Orders

## 2021-10-13 ENCOUNTER — Telehealth: Payer: Self-pay

## 2021-10-13 NOTE — Telephone Encounter (Signed)
Pt was recommended to have a right AKA for gangrene of right foot during his admission in August, but surgery was delayed because he was in Afib with uncontrolled rate and it also mentioned that he was being dc home with hospice based on his clinical state.   His wife called requesting to schedule surgery. He received cardiac clearance on Sept 19. Sent a message to Dr. Virl Cagey inquiring if pt need to be seen prior or is it okay to schedule surgery as requested. Advised pt's wife will contact back once response received. She verbalized understanding.

## 2021-10-16 ENCOUNTER — Other Ambulatory Visit: Payer: Self-pay

## 2021-10-16 DIAGNOSIS — I96 Gangrene, not elsewhere classified: Secondary | ICD-10-CM

## 2021-10-20 ENCOUNTER — Encounter (HOSPITAL_COMMUNITY): Payer: Self-pay | Admitting: Physician Assistant

## 2021-10-20 ENCOUNTER — Encounter (HOSPITAL_COMMUNITY): Payer: Self-pay | Admitting: Vascular Surgery

## 2021-10-20 ENCOUNTER — Other Ambulatory Visit: Payer: Self-pay

## 2021-10-20 NOTE — Progress Notes (Signed)
S.D.W- Instructions   Your procedure is scheduled on Mon., Oct. 2, 2023 from 7:30AM-9:02AM.  Report to [facility] Main Entrance "A" at 5:30 A.M., then check in with the Admitting office.  Call this number if you have problems the morning of surgery:  [phone]   Remember:  Do not eat or drink after midnight on Oct. 1st    Take these medicines the morning of surgery with A SIP OF WATER: Gabapentin (NEURONTIN) Metoprolol Tartrate Omeprazole (PRILOSEC)  If Needed: Acetaminophen (TYLENOL)   Hold Plavix for 5 days prior to surgery.  As of today, STOP taking any Aspirin (unless otherwise instructed by your surgeon) Aleve, Naproxen, Ibuprofen, Motrin, Advil, Goody's, BC's, all herbal medications, fish oil, and all vitamins.  How to Manage Your Diabetes Before and After Surgery  How do I manage my blood sugar before surgery? Check your blood sugar the morning of your surgery when you wake up and every 2 hours until you get to the Short Stay unit. If your blood sugar is less than 70 mg/dL, you will need to treat for low blood sugar: Do not take insulin. Treat a low blood sugar (less than 70 mg/dL) with  cup of clear juice (cranberry or apple), 4 glucose tablets, OR glucose gel. Recheck blood sugar in 15 minutes after treatment (to make sure it is greater than 70 mg/dL). If your blood sugar is not greater than 70 mg/dL on recheck, call [phone]  for further instructions. Report your blood sugar to the short stay nurse when you get to Short Stay.  WHAT DO I DO ABOUT MY DIABETES MEDICATION?      THE MORNING OF SURGERY, take ______9_______ units of ____Lantus______insulin.  If your CBG is greater than 220 mg/dL, inform the staff upon arrival to Pre-Op.  Reviewed and Endorsed by [facility] Patient Education Committee, August 2015          Do not wear jewelry. Do not wear lotions, powders, cologne or deodorant. Do not shave 48 hours prior to surgery.  Men may shave face and  neck. Do not bring valuables to the hospital.  [facility] is not responsible for any belongings or valuables.    Do NOT Smoke (Tobacco/Vaping)  24 hours prior to your procedure  If you use a CPAP at night, you may bring your mask for your overnight stay.   Contacts, glasses, hearing aids, dentures or partials may not be worn into surgery, please bring cases for these belongings   For patients admitted to the hospital, discharge time will be determined by your treatment team.   Patients discharged the day of surgery will not be allowed to drive home, and someone needs to stay with them for 24 hours.  Special instructions:    Oral Hygiene is also important to reduce your risk of infection.  Remember - BRUSH YOUR TEETH THE MORNING OF SURGERY WITH YOUR REGULAR TOOTHPASTE  [place]- Preparing For Surgery  Before surgery, you can play an important role. Because skin is not sterile, your skin needs to be as free of germs as possible. You can reduce the number of germs on your skin by washing with Antibacterial Soap before surgery.     Please follow these instructions carefully.     Shower the NIGHT BEFORE SURGERY and the MORNING OF SURGERY with Antibacterial Soap.   Pat yourself dry with a CLEAN TOWEL.  Wear CLEAN PAJAMAS to bed the night before surgery  Place CLEAN SHEETS on your bed the night  before your surgery  DO NOT SLEEP WITH PETS.  Day of Surgery:  Take a shower with Antibacterial soap. Wear Clean/Comfortable clothing the morning of surgery Do not apply any deodorants/lotions.   Remember to brush your teeth WITH YOUR REGULAR TOOTHPASTE.   If you test positive for Covid, or been in contact with anyone that has tested positive in the last 10 days, please notify your surgeon.  SURGICAL WAITING ROOM VISITATION Patients having surgery or a procedure may have no more than 2 support people in the waiting area - these visitors may rotate.   Children under the age of 28  must have an adult with them who is not the patient. If the patient needs to stay at the hospital during part of their recovery, the visitor guidelines for inpatient rooms apply. Pre-op nurse will coordinate an appropriate time for 1 support person to accompany patient in pre-op.  This support person may not rotate.   Please refer to the [facility] website for the visitor guidelines for Inpatients (after your surgery is over and you are in a regular room).

## 2021-10-20 NOTE — Progress Notes (Signed)
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  Recently admitted 8/16 through 09/15/2021 for failure to thrive.  On further evaluation he was found to have sepsis secondary to dry gangrene and also complicated by acute kidney injury and metabolic encephalopathy. Patient also had episode of hematochezia, he received blood transfusion.  GI was consulted.  Recommended CT scan.  And okay to resume Plavix from GI point of view if needed.  He was also evaluated by cardiology for atrial flutter  And chronic systolic heart failure.  Medications were adjusted, metoprolol was increased. He was evaluated by vascular for dry gangrene, per vascular patient will need right AKA.  After discharge patient was seen in follow-up with cardiology by Ambrose Pancoast, NP on 10/10/2021.  Was noted to be in rate controlled atrial fibrillation at that time with a heart rate of 73.  Recommended continue metoprolol 37.5 mg 3 times daily.  He is not anticoagulated due to low hemoglobin and chronic GI bleed.  He is on Plavix 75 mg daily.  Upcoming surgery was discussed.  Per note, "Mr. Sutch has no new cardiac complaints and is currently rate controlled atrial fibrillation.  He is not on anticoagulation due to history of GI bleeding but is taking Plavix 75 mg.  Based on current criteria and patient's current state of health he is at acceptable risk to proceed with surgical procedure. Mr. Kluver perioperative risk of a major cardiac event is 6.6% according to the Revised Cardiac Risk Index (RCRI).  Therefore, he is at high risk for perioperative complications.   His functional capacity is fair at 3.41 METs  according to the Duke Activity Status Index (DASI). Recommendations: The patient is at high risk for perioperative cardiac complications and is at a low functional capacity.  However, further testing will not change how his cardiac status is managed.  Proceed with surgery at high risk if there are no other options for treatment. Antiplatelet and/or Anticoagulation Recommendations: Clopidogrel (Plavix) is managed by vascular doctors and will comment on hold times."  Per surgery posting, patient instructed by vascular surgery to stop Plavix 5 days prior.  BMP from 09/13/2021 reviewed, mild hypokalemia with potassium 3.1, creatinine elevated to 2.02 (downtrending from recent AKI on CKD, 7.23 on 09/06/2021)  CBC from 09/15/2018 reviewed, WBC elevated at 19.1, anemia with hemoglobin 9.1, otherwise unremarkable.  Patient will need day of surgery evaluation.  EKG 10/10/2021: Atrial fibrillation.  Rate 73.  Minimal voltage criteria for LVH, may be normal variant.  Septal infarct, age undetermined.  CHEST - 2 VIEW 08/16/2021: COMPARISON:  Radiographs 07/06/2021   FINDINGS: Elevated left hemidiaphragm and associated atelectasis. The lungs are otherwise clear. No pleural effusion or pneumothorax. Normal cardiomediastinal silhouette. Aortic calcification.   IMPRESSION: No active cardiopulmonary disease.  TTE 07/04/21: 1. Left ventricular ejection fraction, by estimation, is 35 to 40%. The  left ventricle has moderately decreased function. The left ventricle  demonstrates global hypokinesis. Left ventricular diastolic parameters are  consistent with Grade I diastolic  dysfunction (impaired relaxation).   2. Right ventricular systolic function is normal. The right ventricular  size is normal. There is moderately elevated pulmonary artery systolic  pressure. The estimated right ventricular systolic pressure is 69.6 mmHg.   3.  The mitral valve is grossly normal. Trivial mitral valve  regurgitation. No  evidence of mitral stenosis.   4. The aortic valve is tricuspid. There is mild calcification of the  aortic valve. Aortic valve regurgitation is not visualized. Aortic valve  sclerosis is present, with no evidence of aortic valve stenosis.   5. The inferior vena cava is dilated in size with >50% respiratory  variability, suggesting right atrial pressure of 8 mmHg.   Comparison(s): Changes from prior study are noted. EF has improved  ~35-40%.     Wynonia Musty [facility]/Anesthesiology Phone [phone] 10/20/2021 10:22 AM

## 2021-10-20 NOTE — Progress Notes (Signed)
Interview done with the [name] due to the pt having a history of dementia.  PCP - Dr. Willey Blade  Cardiologist - Dr. Domenic Polite  EP- Denies  Endocrine- Denies  Pulm- Denies  Chest x-ray - 08/17/21 (E)  EKG - 10/10/21 (E)  Stress Test - Denies  ECHO - 07/04/21 (E)  Cardiac Cath - Denies  AICD-na PM-na LOOP-na  Nerve Stimulator- Denies  Dialysis- Denies  Sleep Study - Denies CPAP - Denies  LABS- 10/23/21: CBC, CMP, PT, PTT, T/S x2, PCR, UA   ASA- Denies Plavix- LD- 9/27  ERAS- No  HA1C- 07/04/21(E): 6.0 Fasting Blood Sugar - unknown Checks Blood Sugar ___5__ times a day- Per [name], the pt has a [employer] meter applied to the Left Upper Arm.  Anesthesia- Yes- cardiac history- notified  [name] denies the pt having chest pain, sob, or fever during the pre-op phone call. All instructions explained to [facility], with a verbal understanding of the material. Pt also instructed to wear a mask and social distance if they go out. The opportunity to ask questions was provided.

## 2021-10-20 NOTE — Anesthesia Preprocedure Evaluation (Signed)
Anesthesia Evaluation    Reviewed: Allergy & Precautions, Patient's Chart, lab work & pertinent test results, reviewed documented beta blocker date and time   History of Anesthesia Complications Negative for: history of anesthetic complications  Airway        Dental   Pulmonary Current Smoker and Patient abstained from smoking.,           Cardiovascular hypertension, Pt. on medications and Pt. on home beta blockers + CAD, + Peripheral Vascular Disease (Left AKA 06/2020) and +CHF  + dysrhythmias Atrial Fibrillation   TTE 07/04/21: EF 35-40%, global hypokinesis, grade 1 DD, moderately elevated PASP (71mHg)   Neuro/Psych Anxiety Schizophrenia Dementia    GI/Hepatic Neg liver ROS, GERD  Medicated and Controlled,  Endo/Other  diabetes, Type 2, Insulin DependentFTT, BMI 14  Renal/GU Renal InsufficiencyRenal disease   urinary retention requiring chronic Foley catheter    Musculoskeletal  (+) Arthritis ,   Abdominal   Peds  Hematology  (+) Blood dyscrasia, anemia ,   Anesthesia Other Findings Gangrene right foot  Reproductive/Obstetrics negative OB ROS                            Anesthesia Physical Anesthesia Plan  ASA: 4  Anesthesia Plan: General   Post-op Pain Management: Tylenol PO (pre-op)*   Induction: Intravenous  PONV Risk Score and Plan: 1 and Treatment may vary due to age or medical condition, Dexamethasone and Ondansetron  Airway Management Planned: LMA  Additional Equipment: None  Intra-op Plan:   Post-operative Plan: Extubation in OR  Informed Consent:   Plan Discussed with:   Anesthesia Plan Comments: (PAT note by Allen Caldwell PA-C:  76year old with past medical history significant for dementia, peripheral vascular disease s/p 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  Recently admitted 8/16 through 09/15/2021 for failure to thrive.  On further evaluation he was found to have sepsis secondary to dry gangrene and also complicated by acute kidney injury and metabolic encephalopathy. Patient also had episode of hematochezia, he received blood transfusion. GI was consulted. Recommended CT scan. And okay to resume Plavix from GI point of view if needed.  He was also evaluated by cardiology for atrial flutter And chronic systolic heart failure. Medications were adjusted, metoprolol was increased. He was evaluated by vascular for dry gangrene, per vascular patient will need right AKA.  After discharge patient was seen in follow-up with cardiology by EAmbrose Pancoast NP on 10/10/2021.  Was noted to be in rate controlled atrial fibrillation at that time with a heart rate of 73.  Recommended continue metoprolol 37.5 mg 3 times daily.  He is not anticoagulated due to low hemoglobin and chronic GI bleed.  He is on Plavix 75 mg daily.  Upcoming surgery was discussed.  Per note, "Allen Yu no new cardiac complaints and is currently rate controlled atrial fibrillation. He is not on anticoagulation due to history of GI bleeding but is taking Plavix 75 mg. Based on current criteria and patient's current state of health he is at acceptable risk to proceed with surgical procedure. Allen Yu's perioperative risk of a major cardiac event is6.6% according to the Revised Cardiac Risk Index (RCRI). Therefore, heis at highrisk for perioperative complications. Hisfunctional capacity is fairat 3.41METs according to the Duke Activity Status Index (DASI).  Recommendations: The patient is at high risk for perioperative cardiac complications and is at a low functional capacity. However, further testing will not change how hiscardiac status is managed. Proceed with surgery at high  risk if there are no other options for treatment. Antiplatelet and/or Anticoagulation Recommendations: Clopidogrel (Plavix)is managed by vascular doctors and will comment on hold times."  Per surgery posting, patient instructed by vascular surgery to stop Plavix 5 days prior.  BMP from 09/13/2021 reviewed, mild hypokalemia with potassium 3.1, creatinine elevated to 2.02 (downtrending from recent AKI on CKD, 7.23 on 09/06/2021)  CBC from 09/15/2018 reviewed, WBC elevated at 19.1, anemia with hemoglobin 9.1, otherwise unremarkable.  Patient will need day of surgery evaluation.  EKG 10/10/2021: Atrial fibrillation.  Rate 73.  Minimal voltage criteria for LVH, may be normal variant.  Septal infarct, age undetermined.  CHEST - 2 VIEW 08/16/2021: COMPARISON: Radiographs 07/06/2021  FINDINGS: Elevated left hemidiaphragm and associated atelectasis. The lungs are otherwise clear. No pleural effusion or pneumothorax. Normal cardiomediastinal silhouette. Aortic calcification.  IMPRESSION: No active cardiopulmonary disease.  TTE 07/04/21: 1. Left ventricular ejection fraction, by estimation, is 35 to 40%. The  left ventricle has moderately decreased function. The left ventricle  demonstrates global hypokinesis. Left ventricular diastolic parameters are  consistent with Grade I diastolic  dysfunction (impaired relaxation).  2. Right ventricular systolic function is normal. The right ventricular  size is normal. There is moderately elevated pulmonary artery systolic  pressure. The estimated right ventricular systolic pressure is 00.7 mmHg.  3. The mitral valve is grossly normal. Trivial mitral valve  regurgitation. No evidence of mitral stenosis.  4. The aortic valve is tricuspid. There is mild calcification of the  aortic valve. Aortic valve regurgitation is not visualized. Aortic valve  sclerosis is present, with no evidence of aortic valve stenosis.  5. The inferior vena cava is dilated  in size with >50% respiratory  variability, suggesting right atrial pressure of 8 mmHg.   Comparison(s): Changes from prior study are noted. EF has improved  ~35-40%.    )       Anesthesia Quick Evaluation

## 2021-10-23 ENCOUNTER — Encounter (HOSPITAL_COMMUNITY): Payer: Self-pay | Admitting: Vascular Surgery

## 2021-10-23 ENCOUNTER — Other Ambulatory Visit: Payer: Self-pay

## 2021-10-23 ENCOUNTER — Encounter (HOSPITAL_COMMUNITY)
Admission: RE | Disposition: A | Discharge status: Home Health | Payer: Self-pay | Source: Ambulatory Visit | Attending: Internal Medicine

## 2021-10-23 ENCOUNTER — Observation Stay (HOSPITAL_COMMUNITY)
Admission: RE | Admit: 2021-10-23 | Discharge: 2021-10-24 | DRG: 300 | Disposition: A | Discharge status: Home Health | Payer: Medicare Other | Source: Ambulatory Visit | Attending: Family Medicine | Admitting: Family Medicine

## 2021-10-23 DIAGNOSIS — N183 Chronic kidney disease, stage 3 unspecified: Secondary | ICD-10-CM | POA: Diagnosis present

## 2021-10-23 DIAGNOSIS — Z515 Encounter for palliative care: Secondary | ICD-10-CM | POA: Diagnosis not present

## 2021-10-23 DIAGNOSIS — Z89612 Acquired absence of left leg above knee: Secondary | ICD-10-CM

## 2021-10-23 DIAGNOSIS — F17210 Nicotine dependence, cigarettes, uncomplicated: Secondary | ICD-10-CM | POA: Diagnosis present

## 2021-10-23 DIAGNOSIS — I480 Paroxysmal atrial fibrillation: Secondary | ICD-10-CM | POA: Diagnosis present

## 2021-10-23 DIAGNOSIS — I70262 Atherosclerosis of native arteries of extremities with gangrene, left leg: Secondary | ICD-10-CM | POA: Diagnosis not present

## 2021-10-23 DIAGNOSIS — D631 Anemia in chronic kidney disease: Secondary | ICD-10-CM | POA: Diagnosis present

## 2021-10-23 DIAGNOSIS — E1152 Type 2 diabetes mellitus with diabetic peripheral angiopathy with gangrene: Principal | ICD-10-CM | POA: Diagnosis present

## 2021-10-23 DIAGNOSIS — M1990 Unspecified osteoarthritis, unspecified site: Secondary | ICD-10-CM | POA: Diagnosis not present

## 2021-10-23 DIAGNOSIS — I96 Gangrene, not elsewhere classified: Secondary | ICD-10-CM

## 2021-10-23 DIAGNOSIS — D649 Anemia, unspecified: Secondary | ICD-10-CM | POA: Diagnosis present

## 2021-10-23 DIAGNOSIS — N1832 Chronic kidney disease, stage 3b: Secondary | ICD-10-CM | POA: Diagnosis not present

## 2021-10-23 DIAGNOSIS — K219 Gastro-esophageal reflux disease without esophagitis: Secondary | ICD-10-CM | POA: Diagnosis present

## 2021-10-23 DIAGNOSIS — T83511A Infection and inflammatory reaction due to indwelling urethral catheter, initial encounter: Secondary | ICD-10-CM | POA: Diagnosis not present

## 2021-10-23 DIAGNOSIS — Z89611 Acquired absence of right leg above knee: Secondary | ICD-10-CM

## 2021-10-23 DIAGNOSIS — Z66 Do not resuscitate: Secondary | ICD-10-CM | POA: Diagnosis not present

## 2021-10-23 DIAGNOSIS — G8929 Other chronic pain: Secondary | ICD-10-CM | POA: Diagnosis present

## 2021-10-23 DIAGNOSIS — B965 Pseudomonas (aeruginosa) (mallei) (pseudomallei) as the cause of diseases classified elsewhere: Secondary | ICD-10-CM | POA: Diagnosis not present

## 2021-10-23 DIAGNOSIS — Y846 Urinary catheterization as the cause of abnormal reaction of the patient, or of later complication, without mention of misadventure at the time of the procedure: Secondary | ICD-10-CM | POA: Diagnosis not present

## 2021-10-23 DIAGNOSIS — E876 Hypokalemia: Secondary | ICD-10-CM | POA: Diagnosis not present

## 2021-10-23 DIAGNOSIS — R627 Adult failure to thrive: Secondary | ICD-10-CM | POA: Diagnosis present

## 2021-10-23 DIAGNOSIS — I130 Hypertensive heart and chronic kidney disease with heart failure and stage 1 through stage 4 chronic kidney disease, or unspecified chronic kidney disease: Secondary | ICD-10-CM | POA: Diagnosis present

## 2021-10-23 DIAGNOSIS — F4310 Post-traumatic stress disorder, unspecified: Secondary | ICD-10-CM | POA: Diagnosis present

## 2021-10-23 DIAGNOSIS — I4891 Unspecified atrial fibrillation: Secondary | ICD-10-CM | POA: Diagnosis present

## 2021-10-23 DIAGNOSIS — F209 Schizophrenia, unspecified: Secondary | ICD-10-CM | POA: Diagnosis present

## 2021-10-23 DIAGNOSIS — Z91030 Bee allergy status: Secondary | ICD-10-CM

## 2021-10-23 DIAGNOSIS — E1159 Type 2 diabetes mellitus with other circulatory complications: Secondary | ICD-10-CM | POA: Diagnosis not present

## 2021-10-23 DIAGNOSIS — Z7902 Long term (current) use of antithrombotics/antiplatelets: Secondary | ICD-10-CM

## 2021-10-23 DIAGNOSIS — Z8249 Family history of ischemic heart disease and other diseases of the circulatory system: Secondary | ICD-10-CM

## 2021-10-23 DIAGNOSIS — Z794 Long term (current) use of insulin: Secondary | ICD-10-CM

## 2021-10-23 DIAGNOSIS — Y731 Therapeutic (nonsurgical) and rehabilitative gastroenterology and urology devices associated with adverse incidents: Secondary | ICD-10-CM | POA: Diagnosis not present

## 2021-10-23 DIAGNOSIS — I10 Essential (primary) hypertension: Secondary | ICD-10-CM | POA: Diagnosis present

## 2021-10-23 DIAGNOSIS — E1122 Type 2 diabetes mellitus with diabetic chronic kidney disease: Secondary | ICD-10-CM | POA: Diagnosis present

## 2021-10-23 DIAGNOSIS — E11649 Type 2 diabetes mellitus with hypoglycemia without coma: Secondary | ICD-10-CM | POA: Diagnosis present

## 2021-10-23 DIAGNOSIS — I5042 Chronic combined systolic (congestive) and diastolic (congestive) heart failure: Secondary | ICD-10-CM | POA: Diagnosis present

## 2021-10-23 DIAGNOSIS — Z885 Allergy status to narcotic agent status: Secondary | ICD-10-CM

## 2021-10-23 DIAGNOSIS — N390 Urinary tract infection, site not specified: Secondary | ICD-10-CM | POA: Diagnosis not present

## 2021-10-23 DIAGNOSIS — I255 Ischemic cardiomyopathy: Secondary | ICD-10-CM | POA: Diagnosis present

## 2021-10-23 DIAGNOSIS — Z79899 Other long term (current) drug therapy: Secondary | ICD-10-CM

## 2021-10-23 DIAGNOSIS — F0390 Unspecified dementia without behavioral disturbance: Secondary | ICD-10-CM | POA: Diagnosis present

## 2021-10-23 DIAGNOSIS — T383X5A Adverse effect of insulin and oral hypoglycemic [antidiabetic] drugs, initial encounter: Secondary | ICD-10-CM | POA: Diagnosis present

## 2021-10-23 DIAGNOSIS — I420 Dilated cardiomyopathy: Secondary | ICD-10-CM | POA: Diagnosis not present

## 2021-10-23 DIAGNOSIS — Z888 Allergy status to other drugs, medicaments and biological substances status: Secondary | ICD-10-CM

## 2021-10-23 DIAGNOSIS — H9190 Unspecified hearing loss, unspecified ear: Secondary | ICD-10-CM | POA: Diagnosis present

## 2021-10-23 DIAGNOSIS — M549 Dorsalgia, unspecified: Secondary | ICD-10-CM | POA: Diagnosis present

## 2021-10-23 LAB — URINALYSIS, ROUTINE W REFLEX MICROSCOPIC
Bilirubin Urine: NEGATIVE
Glucose, UA: NEGATIVE mg/dL
Ketones, ur: NEGATIVE mg/dL
Nitrite: POSITIVE — AB
Protein, ur: 100 mg/dL — AB
Specific Gravity, Urine: 1.008 (ref 1.005–1.030)
WBC, UA: >50 WBC/hpf — ABNORMAL HIGH (ref 0–5)
pH: 6 (ref 5.0–8.0)

## 2021-10-23 LAB — COMPREHENSIVE METABOLIC PANEL
ALT: 21 U/L (ref 0–44)
AST: 28 U/L (ref 15–41)
Albumin: 2.3 g/dL — ABNORMAL LOW (ref 3.5–5.0)
Alkaline Phosphatase: 74 U/L (ref 38–126)
Anion gap: 12 (ref 5–15)
BUN: 35 mg/dL — ABNORMAL HIGH (ref 8–23)
CO2: 27 mmol/L (ref 22–32)
Calcium: 8.1 mg/dL — ABNORMAL LOW (ref 8.9–10.3)
Chloride: 100 mmol/L (ref 98–111)
Creatinine, Ser: 1.79 mg/dL — ABNORMAL HIGH (ref 0.61–1.24)
GFR, Estimated: 39 mL/min — ABNORMAL LOW (ref >60)
Glucose, Bld: 54 mg/dL — ABNORMAL LOW (ref 70–99)
Potassium: 2.7 mmol/L — CL (ref 3.5–5.1)
Sodium: 139 mmol/L (ref 135–145)
Total Bilirubin: 0.5 mg/dL (ref 0.3–1.2)
Total Protein: 6.9 g/dL (ref 6.5–8.1)

## 2021-10-23 LAB — CBC
HCT: 19.7 % — ABNORMAL LOW (ref 39.0–52.0)
Hemoglobin: 6.7 g/dL — CL (ref 13.0–17.0)
MCH: 31.2 pg (ref 26.0–34.0)
MCHC: 34 g/dL (ref 30.0–36.0)
MCV: 91.6 fL (ref 80.0–100.0)
Platelets: 284 10*3/uL (ref 150–400)
RBC: 2.15 MIL/uL — ABNORMAL LOW (ref 4.22–5.81)
RDW: 15.4 % (ref 11.5–15.5)
WBC: 11.1 10*3/uL — ABNORMAL HIGH (ref 4.0–10.5)
nRBC: 0 % (ref 0.0–0.2)

## 2021-10-23 LAB — GLUCOSE, CAPILLARY
Glucose-Capillary: 49 mg/dL — ABNORMAL LOW (ref 70–99)
Glucose-Capillary: 136 mg/dL — ABNORMAL HIGH (ref 70–99)
Glucose-Capillary: 74 mg/dL (ref 70–99)
Glucose-Capillary: 63 mg/dL — ABNORMAL LOW (ref 70–99)
Glucose-Capillary: 71 mg/dL (ref 70–99)
Glucose-Capillary: 64 mg/dL — ABNORMAL LOW (ref 70–99)

## 2021-10-23 LAB — PROTIME-INR
INR: 1.2 (ref 0.8–1.2)
Prothrombin Time: 15.3 seconds — ABNORMAL HIGH (ref 11.4–15.2)

## 2021-10-23 LAB — SURGICAL PCR SCREEN
MRSA, PCR: NEGATIVE
Staphylococcus aureus: NEGATIVE

## 2021-10-23 LAB — PREPARE RBC (CROSSMATCH)

## 2021-10-23 LAB — APTT: aPTT: 35 seconds (ref 24–36)

## 2021-10-23 SURGERY — AMPUTATION, ABOVE KNEE
Anesthesia: General | Site: Knee | Laterality: Right

## 2021-10-23 MED ORDER — ACETAMINOPHEN 650 MG RE SUPP: 650.0000 mg | Freq: Four times a day (QID) | RECTAL | Status: DC | PRN

## 2021-10-23 MED ORDER — PANTOPRAZOLE SODIUM 40 MG PO TBEC: 40.0000 mg | DELAYED_RELEASE_TABLET | Freq: Every day | ORAL | Status: DC

## 2021-10-23 MED ORDER — ORAL CARE MOUTH RINSE: 15.0000 mL | Freq: Once | OROMUCOSAL | Status: AC

## 2021-10-23 MED ORDER — ACETAMINOPHEN 500 MG PO TABS
1000.0000 mg | ORAL_TABLET | Freq: Once | ORAL | Status: AC
  Administered 2021-10-23: 1000 mg via ORAL
  Filled 2021-10-23: qty 2

## 2021-10-23 MED ORDER — DEXTROSE 50 % IV SOLN
INTRAVENOUS | Status: AC
  Administered 2021-10-23: 50 mL via INTRAVENOUS
  Filled 2021-10-23: qty 50

## 2021-10-23 MED ORDER — POLYETHYLENE GLYCOL 3350 17 G PO PACK: 17.0000 g | PACK | Freq: Every day | ORAL | Status: DC | PRN

## 2021-10-23 MED ORDER — ACETAMINOPHEN 325 MG PO TABS
650.0000 mg | ORAL_TABLET | Freq: Four times a day (QID) | ORAL | Status: DC | PRN
  Administered 2021-10-24: 650 mg via ORAL
  Filled 2021-10-23: qty 2

## 2021-10-23 MED ORDER — SODIUM CHLORIDE 0.9 % IV SOLN: INTRAVENOUS | Status: DC

## 2021-10-23 MED ORDER — POTASSIUM CHLORIDE 10 MEQ/100ML IV SOLN
10.0000 meq | INTRAVENOUS | Status: AC
  Administered 2021-10-23 (×3): 10 meq via INTRAVENOUS
  Filled 2021-10-23 (×5): qty 100

## 2021-10-23 MED ORDER — CHLORHEXIDINE GLUCONATE CLOTH 2 % EX PADS: 6.0000 | MEDICATED_PAD | Freq: Every day | CUTANEOUS | Status: DC

## 2021-10-23 MED ORDER — FENTANYL CITRATE (PF) 250 MCG/5ML IJ SOLN
INTRAMUSCULAR | Status: AC
  Filled 2021-10-23: qty 5

## 2021-10-23 MED ORDER — DEXTROSE-NACL 5-0.45 % IV SOLN
INTRAVENOUS | Status: DC
  Administered 2021-10-24: 1000 mL via INTRAVENOUS

## 2021-10-23 MED ORDER — DONEPEZIL HCL 10 MG PO TABS
10.0000 mg | ORAL_TABLET | Freq: Every day | ORAL | Status: DC
  Filled 2021-10-23 (×2): qty 1

## 2021-10-23 MED ORDER — SODIUM CHLORIDE 0.9% IV SOLUTION: Freq: Once | INTRAVENOUS | Status: DC

## 2021-10-23 MED ORDER — CHLORHEXIDINE GLUCONATE CLOTH 2 % EX PADS: 6.0000 | MEDICATED_PAD | Freq: Once | CUTANEOUS | Status: DC

## 2021-10-23 MED ORDER — RISPERIDONE 0.5 MG PO TABS
0.5000 mg | ORAL_TABLET | Freq: Every day | ORAL | Status: DC
  Filled 2021-10-23 (×2): qty 1

## 2021-10-23 MED ORDER — INSULIN ASPART 100 UNIT/ML IJ SOLN: 0.0000 [IU] | INTRAMUSCULAR | Status: DC | PRN

## 2021-10-23 MED ORDER — CHLORHEXIDINE GLUCONATE 0.12 % MT SOLN
15.0000 mL | Freq: Once | OROMUCOSAL | Status: AC
  Administered 2021-10-23: 15 mL via OROMUCOSAL
  Filled 2021-10-23: qty 15

## 2021-10-23 MED ORDER — DEXTROSE 50 % IV SOLN
50.0000 mL | Freq: Once | INTRAVENOUS | Status: AC
  Filled 2021-10-23: qty 50

## 2021-10-23 MED ORDER — OXYCODONE HCL 5 MG PO TABS
5.0000 mg | ORAL_TABLET | ORAL | Status: DC | PRN
  Administered 2021-10-24: 5 mg via ORAL
  Filled 2021-10-23: qty 1

## 2021-10-23 MED ORDER — CEFAZOLIN SODIUM-DEXTROSE 2-4 GM/100ML-% IV SOLN
2.0000 g | INTRAVENOUS | Status: DC
  Filled 2021-10-23: qty 100

## 2021-10-23 MED ORDER — ONDANSETRON HCL 4 MG PO TABS: 4.0000 mg | ORAL_TABLET | Freq: Four times a day (QID) | ORAL | Status: DC | PRN

## 2021-10-23 MED ORDER — ADULT MULTIVITAMIN W/MINERALS CH: 1.0000 | ORAL_TABLET | Freq: Every day | ORAL | Status: DC

## 2021-10-23 MED ORDER — SODIUM CHLORIDE 0.9 % IV SOLN: 10.0000 mL/h | Freq: Once | INTRAVENOUS | Status: DC

## 2021-10-23 MED ORDER — HALOPERIDOL LACTATE 5 MG/ML IJ SOLN: 2.0000 mg | Freq: Four times a day (QID) | INTRAMUSCULAR | Status: DC | PRN

## 2021-10-23 MED ORDER — CLOPIDOGREL BISULFATE 75 MG PO TABS: 75.0000 mg | ORAL_TABLET | Freq: Every day | ORAL | Status: DC

## 2021-10-23 MED ORDER — INSULIN ASPART 100 UNIT/ML IJ SOLN: 0.0000 [IU] | Freq: Three times a day (TID) | INTRAMUSCULAR | Status: DC

## 2021-10-23 MED ORDER — PIPERACILLIN-TAZOBACTAM 3.375 G IVPB
3.3750 g | Freq: Three times a day (TID) | INTRAVENOUS | Status: DC
  Administered 2021-10-24 (×2): 3.375 g via INTRAVENOUS
  Filled 2021-10-23 (×2): qty 50

## 2021-10-23 MED ORDER — MIRTAZAPINE 7.5 MG PO TABS
7.5000 mg | ORAL_TABLET | Freq: Every day | ORAL | Status: DC
  Filled 2021-10-23 (×2): qty 1

## 2021-10-23 MED ORDER — PHENYLEPHRINE 80 MCG/ML (10ML) SYRINGE FOR IV PUSH (FOR BLOOD PRESSURE SUPPORT)
PREFILLED_SYRINGE | INTRAVENOUS | Status: AC
  Filled 2021-10-23: qty 10

## 2021-10-23 MED ORDER — PIPERACILLIN-TAZOBACTAM 3.375 G IVPB 30 MIN
3.3750 g | Freq: Once | INTRAVENOUS | Status: AC
  Administered 2021-10-23: 3.375 g via INTRAVENOUS
  Filled 2021-10-23 (×2): qty 50

## 2021-10-23 MED ORDER — GABAPENTIN 100 MG PO CAPS: 100.0000 mg | ORAL_CAPSULE | Freq: Two times a day (BID) | ORAL | Status: DC

## 2021-10-23 MED ORDER — SODIUM CHLORIDE 0.9% FLUSH
3.0000 mL | Freq: Two times a day (BID) | INTRAVENOUS | Status: DC
  Administered 2021-10-23: 3 mL via INTRAVENOUS

## 2021-10-23 MED ORDER — METOPROLOL TARTRATE 25 MG PO TABS
37.5000 mg | ORAL_TABLET | Freq: Three times a day (TID) | ORAL | Status: DC
  Administered 2021-10-23: 37.5 mg via ORAL
  Filled 2021-10-23: qty 2

## 2021-10-23 MED ORDER — ATORVASTATIN CALCIUM 40 MG PO TABS: 40.0000 mg | ORAL_TABLET | Freq: Every day | ORAL | Status: DC

## 2021-10-23 MED ORDER — MORPHINE SULFATE (PF) 2 MG/ML IV SOLN
2.0000 mg | INTRAVENOUS | Status: DC | PRN
  Administered 2021-10-23 – 2021-10-24 (×2): 2 mg via INTRAVENOUS
  Filled 2021-10-23 (×4): qty 1

## 2021-10-23 MED ORDER — FERROUS SULFATE 325 (65 FE) MG PO TABS: 325.0000 mg | ORAL_TABLET | Freq: Every day | ORAL | Status: DC

## 2021-10-23 MED ORDER — HYDRALAZINE HCL 20 MG/ML IJ SOLN: 5.0000 mg | INTRAMUSCULAR | Status: DC | PRN

## 2021-10-23 MED ORDER — BISACODYL 5 MG PO TBEC: 5.0000 mg | DELAYED_RELEASE_TABLET | Freq: Every day | ORAL | Status: DC | PRN

## 2021-10-23 MED ORDER — ONDANSETRON HCL 4 MG/2ML IJ SOLN: 4.0000 mg | Freq: Four times a day (QID) | INTRAMUSCULAR | Status: DC | PRN

## 2021-10-23 MED ORDER — PROPOFOL 10 MG/ML IV BOLUS
INTRAVENOUS | Status: AC
  Filled 2021-10-23: qty 20

## 2021-10-23 MED ORDER — DOXYCYCLINE HYCLATE 100 MG PO TABS: 100.0000 mg | ORAL_TABLET | Freq: Two times a day (BID) | ORAL | Status: DC

## 2021-10-23 MED ORDER — LACTATED RINGERS IV SOLN: INTRAVENOUS | Status: DC

## 2021-10-23 NOTE — Progress Notes (Signed)
Pt refuses temperature checks

## 2021-10-23 NOTE — Progress Notes (Signed)
Patient seen and examined this morning.  Prior to above-knee amputation  Wife at bedside. Allen Yu was not participatory in the discussion. Labs examined demonstrating anemia, hypokalemia, hypoglycemia On exam, Allen Yu was malnourished, failure to thrive.  I had a long discussion with Allen Yu, Allen Yu's spouse.  We discussed that without her excellent care, Allen Yu would have likely succumbed to his multiple comorbidities months ago.  I do not think that Allen Yu has a long life expectancy, likely measured in months.  We discussed continued medical management of the wound versus primary amputation.  I do not think primary amputation will provide significant benefit, and that his rest pain can be controlled with the use of medication.  We discussed that infection could occur, and that would create a decision point-amputation versus comfort care.  Allen Yu appears to be interested in comfort care discussions.  We will hold on amputation at this time.  I appreciate Dr. Lorin Mercy seeing the patient in optimizing him.   I will continue to see Allen Yu.  Should Allen Yu choose to pursue amputation, I will work to schedule it.   Broadus Zander MD

## 2021-10-23 NOTE — Progress Notes (Signed)
     Referral received for Allen Yu re: goals of care discussion. Chart reviewed and updates received from RN. Patient assessed and is not willing to engage appropriately in discussions. He yells "I want to go home, do not cut my foot off". Contacted patient's wife Allen Yu.   I was able to speak with Allen Yu meeting scheduled for 10/24/2021 @ 1300. Family is aware we will meet at patient's bedside.   Thank you for your referral and allowing PMT to assist in Mr./[name]'s care.   Jordan Hawks, FNP-BC Palliative Medicine Team  Phone: [phone]  NO CHARGE

## 2021-10-23 NOTE — Progress Notes (Signed)
Pt hollering sporadically.  Offered more pain medicine but Pt will not uncover arms for access and only states, "I'm going home!"  Attempted to reassure Pt and offer pain medicine again, unsuccessfully.  Will cont plan of care as able.

## 2021-10-23 NOTE — Progress Notes (Signed)
Pharmacy Antibiotic Note  Allen Yu is a 76 y.o. male admitted on 10/23/2021 with UTI.  Pharmacy has been consulted for zosyn dosing.  Pt with multiple medical issues who is here for possible UTI and amputation. He has chronic foley with multiple organisms in the past. D/w Dr Lorin Mercy and we will cover empirically for now for enterococcal and pseudomonas based on the previous culture.   Crcl ~23   Plan: Zosyn 3.375g IV q8  Height: '6\' 1"'$  (185.4 cm) Weight: 47 kg (103 lb 9.9 oz) IBW/kg (Calculated) : 79.9  Temp (24hrs), Avg:98.7 F (37.1 C), Min:98.7 F (37.1 C), Max:98.7 F (37.1 C)  Recent Labs  Lab 10/23/21 0615  WBC 11.1*  CREATININE 1.79*    Estimated Creatinine Clearance: 23.7 mL/min (A) (by C-G formula based on SCr of 1.79 mg/dL (H)).    Allergies  Allergen Reactions   Bee Venom Anaphylaxis   Codeine Other (See Comments)    incoherent  Other reaction(s): Delirium    Propoxyphene Other (See Comments)    Dizziness, "Makes me feel drunk" Other reaction(s): Dizziness    Valsartan Other (See Comments)    incoherent Other reaction(s): Delirium     Antimicrobials this admission: 10/2 zosyn>>  Dose adjustments this admission:   Microbiology results: 10/2 urine>>  Onnie Boer, PharmD, BCIDP, AAHIVP, CPP Infectious Disease Pharmacist 10/23/2021 4:28 PM

## 2021-10-23 NOTE — Progress Notes (Signed)
Patient's CBG 49 upon arrival to short stay.  IV started and an amp of D50 given per protocol.  Patient's CBG rechecked 15 minutes after administration of D50 and CBG 136.  CBG rechecked in 1 hour was 74.  Dr. Daiva Huge contacted and received verbal order for a diet order so patient could eat since surgery has been canceled for today.

## 2021-10-23 NOTE — Progress Notes (Signed)
Pt received from PACU after surgery cancelled.  Pt with soiled brief from BM.  Pt has a urinary catheter in the base of his penis.  Skin assessed and no wounds found.  Sacral foam dressing placed pro phylactically as Pt is very thin.    Pt was cleaned, gown changed, CHG performed, tele applied, VS taken and CCMD notified.  Wife at bedside and plan of care reviewed.  Pain meds given per PRN orders.   Will cont plan of care.

## 2021-10-23 NOTE — Progress Notes (Signed)
Attempt to complete PM assessment. Pt uncovered head and stated " I said I'm going to sleep, now leave me the f*ck alone" then covered head back up. Ignored further attempts at communication. No immediate distress or safety concerns noted. Call bell in reach. Side rails up x 3 for safety.

## 2021-10-23 NOTE — H&P (Signed)
History and Physical    Patient: Allen Yu:[id number] DOB: 1945-03-10 DOA: 10/23/2021 DOS: the patient was seen and examined on 10/23/2021 PCP: [name], MD  Patient coming from: Home - lives with wife and 2 76yo grandsons; NOK: Wife, Tadeusz Stahl, [phone]   Chief Complaint: Here for amputation  HPI: Allen Yu is a 76 y.o. male with medical history significant of dementia, PVD s/p L AKA, urinary retention with chronic foley, DM, and afib presenting with FTT, pain from necrotic leg.  He was last admitted from 8/16-25 with sepsis due to dry leg gangrene from PVD.  He was recommended for comfort care and discharged to home with hospice.  His wife reports that he has days where he is completely cognitively intact and other days where he cannot participate in conversations.  She tries to take him out on an outing at least once a day.  She is concerned about her ability to move him around after his second amputation since he currently is able to assist with transfers, but she is working with the [place] to try to obtain a wheelchair-accessible van.  She reports that the hospice nurse was coming once a week but "not doing anything" she can't do; also, when hospice found out that she was planning to proceed with amputation they signed off.  She reports that he complains of severe pain in the LLE, greater than 10/10 but she tries not to give him the pain medications since she doesn't want him to just sleep all the time.[name]  He had to be transfused during his last hospitalization, as well; no apparent bleeding including in BMs (although they are loose).  At this time, she appears to accept that an amputation is not the best option.  She would like to treat the treatable - UTI, hypoglycemia, hypokalemia, anemia, pain control.    ER Course:   s/p L AKA.  H/o uncontrolled afib, FTT, palliative care was involved.  Needs R AKA. Having lots of pain.  Hgb <7, K+ 2.7, glucose 71.  Wife wants amputation  for pain control.  Needs medical tune up pre-amputation.       Review of Systems: unable to review all systems due to the inability of the patient to answer questions. Past Medical History:  Diagnosis Date   Anemia    Arthritis    Carotid stenosis    Chronic back pain    Chronic HFrEF (heart failure with reduced ejection fraction) ([place])    a. 09/2020 Echo: EF 45%, mild LVH; b. 12/2020 Echo: EF 25-30%, glob HK, mod LVH, mildly reduced RV fxn, RVSP 65.9mHg, mod BAE. Triv effusion. Mod MR/TR.   CKD (chronic kidney disease), stage III ([facility]    Constipation    Dementia ([facility]    Diabetes mellitus    Dilated cardiomyopathy ([facility]    a. 09/2020 Echo: EF 45%; b. 12/2020 Echo: EF 25-30%.   Foot drop, right    GERD (gastroesophageal reflux disease)    History of kidney stones    Hypertension    Lung nodule    PAF (paroxysmal atrial fibrillation) (HCC)    Peripheral neuropathy    Peripheral vascular disease ([facility]    a. 06/2020 s/p L AKA; b. 10/2020 s/p R SFA and above-knee popliteal PTA.   PTSD (post-traumatic stress disorder)    Schizophrenia ([facility]    Tuberculosis    Treated   Past Surgical History:  Procedure Laterality Date   ABDOMINAL AORTOGRAM W/LOWER EXTREMITY N/A 05/20/2020  Procedure: ABDOMINAL AORTOGRAM W/LOWER EXTREMITY;  Surgeon: Elam Dutch, MD;  Location: [facility];  Service: Cardiovascular;  Laterality: N/A;   ABDOMINAL AORTOGRAM W/LOWER EXTREMITY N/A 10/27/2020   Procedure: ABDOMINAL AORTOGRAM W/LOWER EXTREMITY;  Surgeon: Marty Heck, MD;  Location: [facility];  Service: Cardiovascular;  Laterality: N/A;   ABDOMINAL AORTOGRAM W/LOWER EXTREMITY Right 07/26/2021   Procedure: ABDOMINAL AORTOGRAM W/LOWER EXTREMITY;  Surgeon: Broadus Keeon, MD;  Location: [facility];  Service: Cardiovascular;  Laterality: Right;   AMPUTATION Left 06/28/2020   Procedure: AMPUTATION ABOVE KNEE LEFT;  Surgeon: Rosetta Posner, MD;  Location: [facility];  Service:  Vascular;  Laterality: Left;   [place]  2000, 2013   x2   BLADDER SURGERY     02   CATARACT EXTRACTION W/PHACO Right 05/12/2021   Procedure: CATARACT EXTRACTION PHACO AND INTRAOCULAR LENS PLACEMENT ([place]);  Surgeon: Baruch Goldmann, MD;  [facility];  Service: Ophthalmology;  Laterality: Right;  CDE: 27.28   CATARACT EXTRACTION W/PHACO Left 05/29/2021   Procedure: CATARACT EXTRACTION PHACO AND INTRAOCULAR LENS PLACEMENT (IOC);  Surgeon: Baruch Goldmann, MD;  Location: [facility];  Service: Ophthalmology;  Laterality: Left;  CDE  12.96   CHOLECYSTECTOMY     COLONOSCOPY     COLONOSCOPY N/A 12/07/2014   Procedure: COLONOSCOPY;  Surgeon: Daneil Dolin, MD;  [facility];  Service: Endoscopy;  Laterality: N/A;  10:30 Am   ENDOVASCULAR REPAIR/STENT GRAFT Right 07/26/2021   Procedure: ENDOVASCULAR REPAIR/STENT GRAFT;  Surgeon: Broadus Maahir, MD;  Location: [facility];  Service: Cardiovascular;  Laterality: Right;  SFA   EYE SURGERY Bilateral    removed metal from eye   FEMORAL-TIBIAL BYPASS GRAFT Left 05/27/2020   Procedure: LEFT FEMORAL TO PERONEAL ARTERY BYPASS;  Surgeon: Rosetta Posner, MD;  Location: [facility];  Service: Vascular;  Laterality: Left;   HERNIA REPAIR Right    INGUINAL HERNIA REPAIR Left 11/03/2013   Procedure: HERNIA REPAIR INGUINAL ADULT;  Surgeon: Gayland Curry, MD;  Location: [facility];  Service: General;  Laterality: Left;   IRRIGATION AND DEBRIDEMENT FOOT Right 08/17/2021   Procedure: IRRIGATION AND DEBRIDEMENT ULCER RIGHT LEG APPLICATION SKIN SUBSTITUTE;  Surgeon: Criselda Peaches, DPM;  [facility];  Service: Podiatry;  Laterality: Right;   PERIPHERAL VASCULAR BALLOON ANGIOPLASTY Right 07/26/2021   Procedure: PERIPHERAL VASCULAR BALLOON ANGIOPLASTY;  Surgeon: Broadus Masyn, MD;  Location: [facility];  Service: Cardiovascular;  Laterality: Right;  PERONEAL   PERIPHERAL VASCULAR INTERVENTION  10/27/2020   Procedure: PERIPHERAL  VASCULAR INTERVENTION;  Surgeon: Marty Heck, MD;  Location: [facility];  Service: Cardiovascular;;   SHOULDER SURGERY     RIGHT SHOULDER    TRANSMETATARSAL AMPUTATION Right 08/17/2021   Procedure: TRANSMETATARSAL AMPUTATION;  Surgeon: Criselda Peaches, DPM;  [facility];  Service: Podiatry;  Laterality: Right;   VIDEO BRONCHOSCOPY WITH ENDOBRONCHIAL NAVIGATION N/A 07/10/2019   Procedure: VIDEO BRONCHOSCOPY WITH ENDOBRONCHIAL NAVIGATION;  Surgeon: Melrose Nakayama, MD;  Location: [facility];  Service: Thoracic;  Laterality: N/A;   Social History:  reports that he has been smoking cigarettes. He has a 22.50 pack-year smoking history. He has never used smokeless tobacco. He reports current drug use. Drug: Marijuana. He reports that he does not drink alcohol.  Allergies  Allergen Reactions   Bee Venom Anaphylaxis   Codeine Other (See Comments)    incoherent  Other  reaction(s): Delirium    Propoxyphene Other (See Comments)    Dizziness, "Makes me feel drunk" Other reaction(s): Dizziness    Valsartan Other (See Comments)    incoherent Other reaction(s): Delirium     Family History  Problem Relation Age of Onset   Heart disease Mother        before age 65    Prior to Admission medications   Medication Sig Start Date End Date Taking? Authorizing Provider  acetaminophen (TYLENOL) 500 MG tablet Take 500 mg by mouth 2 (two) times daily as needed for moderate pain. 03/07/21  Yes [provider]  atorvastatin (LIPITOR) 40 MG tablet Take 1 tablet (40 mg total) by mouth at bedtime. 07/14/21 10/17/21 Yes Strader, Fransisco Hertz, PA-C  clopidogrel (PLAVIX) 75 MG tablet Take 1 tablet (75 mg total) by mouth daily. 07/07/21 07/07/22 Yes Emokpae, Courage, MD  donepezil (ARICEPT) 10 MG tablet Take 10 mg by mouth at bedtime.   Yes [provider]  doxycycline (VIBRA-TABS) 100 MG tablet Take 1 tablet (100 mg total) by mouth 2 (two) times daily for 28 days. 09/28/21 10/26/21  Yes McDonald, Stephan Minister, DPM  feeding supplement, ENSURE COMPLETE, (ENSURE COMPLETE) LIQD Take 237 mLs by mouth 2 (two) times daily between meals. Patient taking differently: Take 237 mLs by mouth 2 (two) times daily as needed (Between meals). 01/19/21  Yes Roxan Hockey, MD  ferrous sulfate 325 (65 FE) MG tablet Take 325 mg by mouth daily with breakfast. 03/07/21  Yes [provider]  gabapentin (NEURONTIN) 100 MG capsule Take 1 capsule (100 mg total) by mouth 2 (two) times daily. 07/07/21  Yes Emokpae, Courage, MD  insulin glargine (LANTUS) 100 UNIT/ML injection Inject 18 Units into the skin daily.   Yes [provider]  Metoprolol Tartrate 37.5 MG TABS Take 37.5 mg by mouth 3 (three) times daily. 09/15/21 10/17/21 Yes Regalado, Belkys A, MD  mirtazapine (REMERON) 7.5 MG tablet Take 1 tablet (7.5 mg total) by mouth at bedtime. Patient taking differently: Take 7.5 mg by mouth every morning. 09/15/21  Yes Regalado, Belkys A, MD  Multiple Vitamins-Minerals (MULTIVITAMIN WITH MINERALS) tablet Take 1 tablet by mouth daily. 01/19/21 01/19/22 Yes Emokpae, Courage, MD  omeprazole (PRILOSEC) 20 MG capsule Take 20 mg by mouth daily. 03/23/21  Yes [provider]  risperiDONE (RISPERDAL) 0.5 MG tablet Take 1 tablet (0.5 mg total) by mouth every 12 (twelve) hours as needed (Restlessness and agitation). Patient taking differently: Take 0.5 mg by mouth at bedtime. 01/19/21  Yes Roxan Hockey, MD    Physical Exam: Vitals:   10/23/21 1318 10/23/21 1347 10/23/21 1537 10/23/21 1605  BP: 137/67 135/70 (!) 156/111 124/84  Pulse:      Resp: '18 14 13 13  '$ Temp:      TempSrc:      SpO2:      Weight:      Height:       General:  Head covered with blankets, when removed he asked to eat and then didn't speak any further Eyes:  EOMI, normal lids, iris ENT:  hard of hearing, grossly normal lips & tongue, mmm Neck:  no LAD, masses or thyromegaly Cardiovascular:  RRR, no m/r/g. No LE edema.   Respiratory:   CTA bilaterally with no wheezes/rales/rhonchi.  Normal respiratory effort. Abdomen:  soft, NT, ND Skin:  LLE s/p poorly healing TMA with non-healing wound along lateral lower leg and gangrene of dorsal foot with gangrenous pressure ulcer on heel  Musculoskeletal:  s/p R AKA, LLE gangrene as noted above Psychiatric:  flat, confused mood and affect, speech sparse Neurologic:  unable to effectively perform   Radiological Exams on Admission: Independently reviewed - see discussion in A/P where applicable  No results found.  EKG: not done   Labs on Admission: I have personally reviewed the available labs and imaging studies at the time of the admission.  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 (HCC) Active Problems:   Anemia   Chronic combined systolic (congestive) and diastolic (congestive) heart failure (HCC)   Unspecified atrial fibrillation (HCC)   Essential (primary) hypertension   Type 2 diabetes mellitus with other circulatory complications (HCC)   Hypoglycemia due to insulin   S/P AKA (above knee amputation) unilateral, left (HCC)   Unspecified dementia, unspecified severity, without behavioral disturbance, psychotic disturbance, mood disturbance, and anxiety (HCC)   CKD (chronic kidney disease) stage 3, GFR 30-59 ml/min (HCC)   UTI (urinary tract infection) due to urinary indwelling Foley catheter ([place])    Assessment and Plan: No notes have been filed under this hospital service. Service: Hospitalist   Dry leg gangrene, PVD -Patient was previously admitted for sepsis associated with this issue and treated with IV abx -> PO doxy x 1 month,  continuing -He has not had healing of the wound, as anticipated -He returned for L AKA today but this was aborted due to other medical problems -Based on the extent of his medical conditions, his surgical outcome is poor -However, without surgery his prognosis is also quite poor -This was discussed with his wife first by Dr. Virl Cagey and this by me -Based on these discussions, the wife prefers admission to treat the treatable (UTI, hypoglycemia, hypokalemia, anemia, pain control) but is likely to decline further surgical intervention -Will request palliative care consult -If not improving while hospitalized, he may need to transition to comfort care only (his wife did not appear ready to take that step yet) -His wife has been reluctant to give pain medications since she doesn't want him sedated; will attempt to determine the most effective pain medication regimen for him, but sedation may have to be an issue in order to control his pain  -Continue Clopidogrel, Atorvastatin -Continue gabapentin  Anemia -Previous concern for GI bleed -Per prior note, GI recommended a CT scan but the patient was unable to cooperate -GI bleeding is a consideration, but this could also be related to consumptive coagulopathy in the setting of gangrene -Regardless, he is not a current candidate for intervention -Will transfuse 2 units for now and recheck CBC in AM  UTI with chronic foley -Per nursing, his foley is at the base of his penis and would need to be changed by urology -UA is highly suggestive of UTI -Will repeat urine culture but will not attempt to change catheter at this time -Suggest inpatient vs. Outpatient urology consult depending on how he does otherwise medically -Prior urine culture on 8/16 grew E faecalis and Pseudomonas that was sensitive to Amp, Vanc, Gent, and Imipenem -Message in to pharmacy to ask for guidance about most appropriate current therapy pending culture - they have recommended  Zosyn  DM  with hypoglycemia -A1c on 6/15 was 6.0 -Given his overall prognosis, his A1c goal is 7.5-8 -He is unlikely to suffer long-term effects of hyperglycemia but is at significant risk for hypoglycemia -Will stop insulin altogether at this time -Can cover with sensitive-scale SSI and add back low-dose insulin if needed  Dementia -Patient with baseline limited level of functioning - wife reports some fluctuation -Continue Aricept, mirtazepine, risperidone -Will order delirium precautions  Afib  -Rate controlled with metoprolol -No AC due to recent GI bleeding  Stage 3b CKD -Appears to be stable at this time -Attempt to avoid nephrotoxic medications -Recheck BMP in AM   Chronic combined CHF -Echo on 6/13 with EF 35-40% with grade 1 diastolic dysfunction -Appears to be compensated currently -No longer taking diuretics  DNR -I have discussed code status with the patient's wife and he would prefer to die a natural death should that situation arise. -He will need a gold out of facility DNR form at the time of discharge       Advance Care Planning:   Code Status: DNR   Consults: Vascular surgery; palliative care; nutrition; TOC team  DVT Prophylaxis: None currently  Family Communication: Wife was present throughout evaluation  Severity of Illness: The appropriate patient status for this patient is INPATIENT. Inpatient status is judged to be reasonable and necessary in order to provide the required intensity of service to ensure the patient's safety. The patient's presenting symptoms, physical exam findings, and initial radiographic and laboratory data in the context of their chronic comorbidities is felt to place them at high risk for further clinical deterioration. Furthermore, it is not anticipated that the patient will be medically stable for discharge from the hospital within 2 midnights of admission.   * I certify that at the point of admission it is my clinical  judgment that the patient will require inpatient hospital care spanning beyond 2 midnights from the point of admission due to high intensity of service, high risk for further deterioration and high frequency of surveillance required.*  Author: Karmen Bongo, MD 10/23/2021 4:13 PM  For on call review [email or web address].

## 2021-10-23 NOTE — Progress Notes (Signed)
Patient's Hgb 6.8 and potassium 2.7 on labs this morning upon arrival to short stay, in addition to his blood sugar being low.  Received orders from anesthesia for 4 runs of potassium and to transfuse 1 unit of blood prior to surgery.  Dr. Virl Cagey notified of patient's status and came to speak to patient and patient's wife.  Dr. Virl Cagey canceled surgery for today and wants patient to be admitted to have hgb and potassium corrected before surgery.  Patient's wife verbalized understanding.

## 2021-10-23 NOTE — Progress Notes (Signed)
Verbal consent for blood taken over the phone with Pts [name].  2nd nurse verification by Laurelyn Sickle, RN.

## 2021-10-23 NOTE — Progress Notes (Addendum)
Refused all oral medications stating again to leave him alone so he could sleep. CBG 62. Dr. Alcario Drought on call and notified via page. Awaiting call back at this time.   2255-Dr. Alcario Drought aware. NO received.   2310- Agitation continues. Patient verbally and physically aggressive--swinging at nurse--while this writer attempting to initiate IV fluids. Yelling that he does not want a shot. This Probation officer explained that the IV fluids were to help keep his blood sugar from dropping too low. Pt stated "If its low, let it stay that way, tell [name] to bring me some [place] and leave me the f*ck alone" then asked tech and nurse to get him to the door so he can get the f*ck out of here. IV fluids initiated but patient stating " that's the last thing ya'll are going to do tonight, now leave me alone and let me go to sleep" & "Just cut this leg off tomorrow and let me go". No acute distress or safety concerns noted. SR up x 3, bed alarm engaged, call bell left in reach.

## 2021-10-24 DIAGNOSIS — D631 Anemia in chronic kidney disease: Secondary | ICD-10-CM

## 2021-10-24 DIAGNOSIS — F01B0 Vascular dementia, moderate, without behavioral disturbance, psychotic disturbance, mood disturbance, and anxiety: Secondary | ICD-10-CM

## 2021-10-24 DIAGNOSIS — I4891 Unspecified atrial fibrillation: Secondary | ICD-10-CM

## 2021-10-24 DIAGNOSIS — N1832 Chronic kidney disease, stage 3b: Secondary | ICD-10-CM | POA: Diagnosis not present

## 2021-10-24 DIAGNOSIS — E160 Drug-induced hypoglycemia without coma: Secondary | ICD-10-CM | POA: Diagnosis not present

## 2021-10-24 DIAGNOSIS — I420 Dilated cardiomyopathy: Secondary | ICD-10-CM | POA: Diagnosis not present

## 2021-10-24 DIAGNOSIS — I5042 Chronic combined systolic (congestive) and diastolic (congestive) heart failure: Secondary | ICD-10-CM

## 2021-10-24 DIAGNOSIS — Z89612 Acquired absence of left leg above knee: Secondary | ICD-10-CM | POA: Diagnosis not present

## 2021-10-24 DIAGNOSIS — N1831 Chronic kidney disease, stage 3a: Secondary | ICD-10-CM

## 2021-10-24 DIAGNOSIS — E1159 Type 2 diabetes mellitus with other circulatory complications: Secondary | ICD-10-CM

## 2021-10-24 DIAGNOSIS — E1152 Type 2 diabetes mellitus with diabetic peripheral angiopathy with gangrene: Secondary | ICD-10-CM | POA: Diagnosis not present

## 2021-10-24 DIAGNOSIS — Z515 Encounter for palliative care: Secondary | ICD-10-CM

## 2021-10-24 DIAGNOSIS — I10 Essential (primary) hypertension: Secondary | ICD-10-CM

## 2021-10-24 DIAGNOSIS — I70262 Atherosclerosis of native arteries of extremities with gangrene, left leg: Secondary | ICD-10-CM | POA: Diagnosis not present

## 2021-10-24 DIAGNOSIS — T383X5A Adverse effect of insulin and oral hypoglycemic [antidiabetic] drugs, initial encounter: Secondary | ICD-10-CM

## 2021-10-24 DIAGNOSIS — Z66 Do not resuscitate: Secondary | ICD-10-CM | POA: Diagnosis not present

## 2021-10-24 DIAGNOSIS — I130 Hypertensive heart and chronic kidney disease with heart failure and stage 1 through stage 4 chronic kidney disease, or unspecified chronic kidney disease: Secondary | ICD-10-CM | POA: Diagnosis not present

## 2021-10-24 LAB — GLUCOSE, CAPILLARY
Glucose-Capillary: 60 mg/dL — ABNORMAL LOW (ref 70–99)
Glucose-Capillary: 104 mg/dL — ABNORMAL HIGH (ref 70–99)

## 2021-10-24 LAB — BPAM RBC
Blood Product Expiration Date: 202310312359
Blood Product Expiration Date: 202310312359
ISSUE DATE / TIME: 202310021308
ISSUE DATE / TIME: 202310021555
Unit Type and Rh: 5100
Unit Type and Rh: 5100

## 2021-10-24 LAB — TYPE AND SCREEN
ABO/RH(D): O POS
Antibody Screen: NEGATIVE
Unit division: 0
Unit division: 0

## 2021-10-24 MED ORDER — OXYCODONE HCL 5 MG PO TABS: 5.0000 mg | ORAL_TABLET | ORAL | 0 refills | Status: DC | PRN

## 2021-10-24 NOTE — Care Management CC44 (Signed)
Condition Code 44 Documentation Completed  Patient Details  Name: Allen Yu MRN: [id number] Date of Birth: 20-Jan-1946   Condition Code 44 given:  No (wife declined) Patient signature on Condition Code 28 notice:  No (wife declined) Documentation of 2 MD's agreement:  Yes Code 44 added to claim:  Yes    Dahlia Client Romeo Rabon, RN 10/24/2021, 4:20 PM

## 2021-10-24 NOTE — TOC Transition Note (Addendum)
Transition of Care (TOC) - CM/SW Discharge Note Marvetta Gibbons RN, BSN Transitions of Care Unit 4E- RN Case Manager See Treatment Team for direct phone #    Patient Details  Name: Allen Yu MRN: [id number] Date of Birth: November 07, 1945  Transition of Care [facility]) CM/SW Contact:  Dawayne Patricia, RN Phone Number: 10/24/2021, 11:22 AM   Clinical Narrative:    Received msg from [facility] team that wife wants pt to return home with Home Hospice services. Wife will plan to transport pt home when ready. Pt had previously been under Hospice services with [facility]. Wife would like to re-instate services.   Call made to [place] with [facility]- per conversation pt revoked hospice services on 10/13/21. Pt would  need a new intake referral at this time.  CM spoke with Tammy in intake and referral has been sent via [place] for review to re-instate pt under [facility]. - Referral pending review at this time.   1340- received call from Tammy at [place] has spoken with wife and per Tammy wife has informed [facility] that she does not want Hospice services at this time- she just wants to get pt home and then "figure things out"- Tammy informed wife that she can give them a call should she decide that she wants to [facility] or if she wants PC services- per Tammy they will not move forward with referral at this time. Wife can pick pt up from hospital whenever she is ready- as we are no longer waiting to confirm Hospice services.  Charge RN at [employer] bedside and has been updated on situation.      Final next level of care: Home w Hospice Care Barriers to Discharge: No Barriers Identified   Patient Goals and CMS Choice Patient states their goals for this hospitalization and ongoing recovery are:: return home under Hospice care   Choice offered to / list presented to : Spouse  Discharge Placement                Home w/ Hospice.         Discharge Plan and Services   Discharge Planning Services: CM Consult Post Acute Care Choice: Hospice, Resumption of Svcs/PTA Provider          DME Arranged: N/A DME Agency: NA         HH Agency: [facility] Date [place]: 10/24/21 Time [place]: 1121 Representative spoke with at [place]: [place] Determinants of Health ([place]) Interventions     Readmission Risk Interventions    10/24/2021   11:22 AM 09/15/2021   12:14 PM 07/05/2021   12:17 PM  Readmission Risk Prevention Plan  Transportation Screening Complete Complete Complete  Medication Review Press photographer)  Complete Complete  PCP or Specialist appointment within 3-5 days of discharge Not Complete Not Complete Not Complete  PCP/Specialist Appt Not Complete comments Pt going home w/ Hospice Pt going home with Hospice   [place] or [place] Complete Complete   SW Recovery Care/Counseling Consult Complete Complete Complete  Palliative Care Screening Complete Complete Not [place] Not Applicable Not Applicable Not Applicable

## 2021-10-24 NOTE — Consult Note (Signed)
Consultation Note Date: 10/24/2021   Patient Name: Allen Yu  DOB: 1945-07-21  MRN: [id number]  Age / Sex: 76 y.o., male  PCP: [name], MD Referring Physician: Patrecia Pour, MD  Reason for Consultation: Establishing goals of care  HPI/Patient Profile: Per H&P -> Allen Yu is a 76 y.o. male with medical history significant of dementia, PVD s/p L AKA, urinary retention with chronic foley, DM, and afib presenting with FTT, pain from necrotic leg.  He was last admitted from 8/16-25 with sepsis due to dry leg gangrene from PVD.  He was recommended for comfort care and discharged to home with hospice.  His wife reports that he has days where he is completely cognitively intact and other days where he cannot participate in conversations.  She tries to take him out on an outing at least once a day.  She is concerned about her ability to move him around after his second amputation since he currently is able to assist with transfers, but she is working with the [place] to try to obtain a wheelchair-accessible van.  She reports that the hospice nurse was coming once a week but "not doing anything" she can't do; also, when hospice found out that she was planning to proceed with amputation they signed off.  She reports that he complains of severe pain in the LLE, greater than 10/10 but she tries not to give him the pain medications since she doesn't want him to just sleep all the time.[name]  He had to be transfused during his last hospitalization, as well; no apparent bleeding including in BMs (although they are loose).  At this time, she appears to accept that an amputation is not the best option.  She would like to treat the treatable - UTI, hypoglycemia, hypokalemia, anemia, pain control.    Clinical Assessment and Goals of Care: I have reviewed medical records including EPIC notes, labs and imaging, assessed the patient and  then called Allen Yu (Wife)  to discuss diagnosis prognosis, GOC, EOL wishes, disposition and options.  I introduced Palliative Medicine as specialized medical care for people living with serious illness. It focuses on providing relief from the symptoms and stress of a serious illness. The goal is to improve quality of life for both the patient and the family.  We discussed a brief life review of the patient. Allen Yu states that Allen Yu has been on hospice prior to this admission. She expressed some concerns with how hospice was managing his pain. Educated Allen Yu on the expectations and reality of hospice at home as they do not provide 24/7 care. She states that he does not need this, however, managing his pain is priority. Discussed aggressive symptom management while allowing patient to age in place/avoid future hospitalizations.  As far as functional and nutritional status, he has limited movement to the L AKA d/t severe pain. He is able to feed himself.    We discussed patient's current illness and what it means in the larger context of patient's on-going co-morbidities.  Natural disease trajectory and expectations at EOL were discussed. I attempted to elicit values and goals of care important to the patient.    Allen Yu made it clear that Allen Yu has dementia and is NOT able to make his own medical decisions. I made her aware that Allen Yu is refusing medical treatment in regards to his CBGs. She states "this is why I cannot be there today".   She wants patient to be as medically optimized as possible in the hospital and then plans to take him back home with hospice. She is aware that her husband has an uncurable infection that may continue to deplete his hemoglobin. Her main concern is having adequate pain management. She states whenever the primary team feels he can come home, she will pick him up.   Attending Dr. Bonner Puna and [facility] made aware of Allen Yu's wishes to avoid surgery and have patient return home with  Hospice services.  Questions and concerns were addressed. The family was encouraged to call with questions or concerns. PMT will shadow the patient's chart and re-engage at patient/family's request, if patient's goals change, or if patient's health deteriorates during this hospitalization.   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)  Unspecified dementia, unspecified severity, without behavioral disturbance, psychotic disturbance, mood disturbance, and anxiety (HCC)  Unspecified atrial fibrillation (HCC)  Type 2 diabetes mellitus with other circulatory complications (HCC)  Essential (primary) hypertension  Hypoglycemia due to insulin  CKD (chronic kidney disease) stage 3, GFR 30-59 ml/min (HCC)  Chronic combined systolic (congestive) and diastolic (congestive) heart failure (HCC)  Anemia  UTI (urinary tract infection) due to urinary indwelling Foley catheter [facility])   Physical Exam Vitals and nursing note reviewed.  Cardiovascular:     Rate and Rhythm: Normal rate and regular rhythm.  Pulmonary:     Effort: Pulmonary effort is normal.  Abdominal:     Palpations: Abdomen is soft.  Skin:    General: Skin is warm.     Comments: Amputation of left metatarsals with clean, dryu and edges approximated  Neurological:     Mental Status: He is alert. He is disoriented.     Palliative Assessment/Data: 50%    Thank you for this consult. Palliative medicine will continue to follow and assist holistically.   Time Total: 75 minutes  Greater than 50%  of this time was spent counseling and coordinating care related to the above assessment and plan.  Signed by: Jordan Hawks, DNP, FNP-BC Palliative Medicine    Please contact Palliative Medicine Team phone at [phone] for questions and  concerns.  For individual provider: See Shea Evans

## 2021-10-24 NOTE — Progress Notes (Signed)
Initial Nutrition Assessment  DOCUMENTATION CODES:   Not applicable  INTERVENTION:  - Add Magic cup BID with meals, each supplement provides 290 kcal and 9 grams of protein.   NUTRITION DIAGNOSIS:   Increased nutrient needs related to wound healing as evidenced by estimated needs.  GOAL:   Patient will meet greater than or equal to 90% of their needs  MONITOR:   PO intake, Supplement acceptance  REASON FOR ASSESSMENT:   Consult Wound healing  ASSESSMENT:   76 y.o. male admits related to FTT and pain from necrotic leg. PMH includes: PVD s/p L AKA, urinary retention, DM, CKD stage 3, dementia.  Meds reviewed: ferrous sulfate, remeron, MVI. No new labs this admission.   Pt was sleeping at time of assessment with his head underneath the covers. Pt would not respond to sound of voice or knock on the door even after multiple attempts at calling his name. RN reports that the pt had a few bites of his breakfast tray this am. Per record, pt ate 50% of his dinner last night. She states that they have been trying to get him to drink protein shakes but he keeps refusing. RD will add Magic Cup with lunch and dinner. Pt with increased nutrient needs r/t wound healing. RD will continue to monitor PO intakes.   NUTRITION - FOCUSED PHYSICAL EXAM: Unable to complete; pt with head under covers and would not answer RD.   Diet Order:   Diet Order             Diet heart healthy/carb modified Room service appropriate? Yes; Fluid consistency: Thin  Diet effective now                   EDUCATION NEEDS:   Not appropriate for education at this time  Skin:  Skin Assessment: Skin Integrity Issues: Skin Integrity Issues:: Other (Comment) Other: Gangrene of LLE  Last BM:  Unknown  Height:   Ht Readings from Last 1 Encounters:  10/23/21 '6\' 1"'$  (1.854 m)    Weight:   Wt Readings from Last 1 Encounters:  10/23/21 47 kg    Ideal Body Weight:  76.9 kg (pt with R AKA)  BMI:  Body  mass index is 13.67 kg/m.  Estimated Nutritional Needs:   Kcal:  1194-1740 kcals  Protein:  70-85 gm  Fluid:  8144-8185 mL  Thalia Bloodgood, RD, LDN, CNSC

## 2021-10-24 NOTE — Plan of Care (Signed)
  Problem: Education: Goal: Ability to describe self-care measures that may prevent or decrease complications (Diabetes Survival Skills Education) will improve 10/24/2021 1417 by Emmaline Life, RN Outcome: Adequate for Discharge 10/24/2021 1031 by Emmaline Life, RN Outcome: Not Progressing Goal: Individualized Educational Video(s) Outcome: Adequate for Discharge   Problem: Coping: Goal: Ability to adjust to condition or change in health will improve Outcome: Adequate for Discharge   Problem: Fluid Volume: Goal: Ability to maintain a balanced intake and output will improve Outcome: Adequate for Discharge   Problem: Health Behavior/Discharge Planning: Goal: Ability to identify and utilize available resources and services will improve Outcome: Adequate for Discharge Goal: Ability to manage health-related needs will improve Outcome: Adequate for Discharge   Problem: Metabolic: Goal: Ability to maintain appropriate glucose levels will improve Outcome: Adequate for Discharge   Problem: Nutritional: Goal: Maintenance of adequate nutrition will improve Outcome: Adequate for Discharge Goal: Progress toward achieving an optimal weight will improve Outcome: Adequate for Discharge   Problem: Skin Integrity: Goal: Risk for impaired skin integrity will decrease Outcome: Adequate for Discharge   Problem: Tissue Perfusion: Goal: Adequacy of tissue perfusion will improve Outcome: Adequate for Discharge   Problem: Increased Nutrient Needs (NI-5.1) Goal: Food and/or nutrient delivery Description: Individualized approach for food/nutrient provision. Outcome: Adequate for Discharge

## 2021-10-24 NOTE — Plan of Care (Signed)
  Problem: Education: Goal: Ability to describe self-care measures that may prevent or decrease complications (Diabetes Survival Skills Education) will improve Outcome: Not Progressing

## 2021-10-24 NOTE — Progress Notes (Signed)
Refused dextrose, fluids, and food.  CBG 60. D5 1/2 NS infusing per order. No acute distress noted.

## 2021-10-24 NOTE — Progress Notes (Addendum)
        Pending palliative discussion and goals of care. Meeting today 10/24/2021 @ 1300  Vascular is available for surgical intervention if the family decides to proceed with amputation.   Roxy Horseman PA-C  VASCULAR STAFF ADDENDUM: I have independently interviewed and examined the patient. I agree with the above.  Pt would be best-served with palliative care.   Cassandria Santee, MD [facility] Phone Number: [phone] 10/24/2021 1:36 PM

## 2021-10-24 NOTE — Consult Note (Signed)
   [facility] CM Inpatient Consult   10/24/2021  DAMEER SPEISER [birth year] [id number]   [facility] [ACO] Patient: Medicare ACO REACH Primary Care Provider: [name], MD office has access for a Care Coordination team for transition of care needs, if needed    Patient was assessed for [facility] for community services. Patient was previously active with [facility].  Came to the bedside regarding being restarted with [facility] services no family was currently at the bedside. Will reach out for post transitional needs for community.  Patient was being recommended for Hospice/Palliative Care however, inpatient [facility] RNCM updated this writer that he will return home with home health.    Plan:  Will refer patient to [facility] team for outreach request and  follow up for readmission prevention measures as this patient is scoring with extreme high risk for unplanned readmission.  Of note, [facility] Care Management services does not replace or interfere with any services that are arranged by inpatient [facility] care management team.   For additional questions or referrals please contact:   Natividad Brood, RN BSN [place]  [phone] business mobile phone Toll free office [phone]  *[place]  [phone] Fax number: [phone] [name]

## 2021-10-24 NOTE — Care Management Obs Status (Signed)
[facility] NOTIFICATION   Patient Details  Name: Allen Yu MRN: [id number] Date of Birth: May 23, 1945   Medicare Observation Status Notification Given:  No (wife declined)    Dawayne Patricia, RN 10/24/2021, 4:20 PM

## 2021-10-24 NOTE — Discharge Summary (Signed)
Physician Discharge Summary   Patient: Allen Yu MRN: [id number] DOB: [birth year]  Admit date:     10/23/2021  Discharge date: 10/24/21  Discharge Physician: Patrecia Pour   PCP: [name], MD   Recommendations at discharge:  Follow up/reestablish care with [facility]. Discharged home with hospice after discussions with patient and spouse. They decline amputation and have refused all other medications during the brief stay here.   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) Active Problems:   Anemia   Chronic combined systolic (congestive) and diastolic (congestive) heart failure (HCC)   Unspecified atrial fibrillation (HCC)   Essential (primary) hypertension   Type 2 diabetes mellitus with other circulatory complications (HCC)   Hypoglycemia due to insulin   S/P AKA (above knee amputation) unilateral, left (HCC)   Unspecified dementia, unspecified severity, without behavioral disturbance, psychotic disturbance, mood disturbance, and anxiety (HCC)   CKD (chronic kidney disease) stage 3, GFR 30-59 ml/min (HCC)   UTI (urinary tract infection) due to urinary indwelling Foley catheter [facility])   Hospice care patient  Hospital Course: Chief Complaint: Here for amputation   HPI: Allen Yu is a 76 y.o. male with medical history significant of dementia, PVD s/p L AKA, urinary retention with chronic foley, DM, and afib presenting with FTT, pain from necrotic leg.  He was last admitted from 8/16-25 with sepsis due to dry leg gangrene from PVD.  He was recommended for comfort care and discharged to home with hospice.  His wife reports that he has days where he is completely cognitively intact and other days where he cannot participate in conversations.  She tries to take him out on an outing at least once a day.  She is concerned about her ability to move him around after his second amputation since he currently is  able to assist with transfers, but she is working with the [place] to try to obtain a wheelchair-accessible van.  She reports that the hospice nurse was coming once a week but "not doing anything" she can't do; also, when hospice found out that she was planning to proceed with amputation they signed off.  She reports that he complains of severe pain in the LLE, greater than 10/10 but she tries not to give him the pain medications since she doesn't want him to just sleep all the time.[name]  He had to be transfused during his last hospitalization, as well; no apparent bleeding including in BMs (although they are loose).  At this time, she appears to accept that an amputation is not the best option.  She would like to treat the treatable - UTI, hypoglycemia, hypokalemia, anemia, pain control.    ER Course:   s/p L AKA.  H/o uncontrolled afib, FTT, palliative care was involved.  Needs R AKA. Having lots of pain.  Hgb <7, K+ 2.7, glucose 71.  Wife wants amputation for pain control.  Needs medical tune up pre-amputation.    Hospital Course: The patient did not allow for any medical interventions overnight, removed monitoring, refused vital signs, declined examinations and blood work. Declined treatments. He and his wife would like to be discharged home with resumption of hospice and to NOT undergo amputation. Referral to [facility] is made and the patient is discharged with prescription for oxycodone for comfort.   Consultants: Vascular surgery, palliative care Procedures performed: None  Disposition: Hospice care at home Diet recommendation: As tolerated DISCHARGE MEDICATION:  Allergies as of 10/24/2021       Reactions   Bee Venom Anaphylaxis   Codeine Other (See Comments)   incoherent  Other reaction(s): Delirium   Propoxyphene Other (See Comments)   Dizziness, "Makes me feel drunk" Other reaction(s): Dizziness   Valsartan Other (See Comments)   incoherent Other reaction(s): Delirium         Medication List     TAKE these medications    acetaminophen 500 MG tablet Commonly known as: TYLENOL Take 500 mg by mouth 2 (two) times daily as needed for moderate pain.   atorvastatin 40 MG tablet Commonly known as: LIPITOR Take 1 tablet (40 mg total) by mouth at bedtime.   clopidogrel 75 MG tablet Commonly known as: Plavix Take 1 tablet (75 mg total) by mouth daily.   donepezil 10 MG tablet Commonly known as: ARICEPT Take 10 mg by mouth at bedtime.   doxycycline 100 MG tablet Commonly known as: VIBRA-TABS Take 1 tablet (100 mg total) by mouth 2 (two) times daily for 28 days.   feeding supplement (ENSURE COMPLETE) Liqd Take 237 mLs by mouth 2 (two) times daily between meals. What changed:  when to take this reasons to take this   ferrous sulfate 325 (65 FE) MG tablet Take 325 mg by mouth daily with breakfast.   gabapentin 100 MG capsule Commonly known as: NEURONTIN Take 1 capsule (100 mg total) by mouth 2 (two) times daily.   insulin glargine 100 UNIT/ML injection Commonly known as: LANTUS Inject 18 Units into the skin daily.   Metoprolol Tartrate 37.5 MG Tabs Take 37.5 mg by mouth 3 (three) times daily.   mirtazapine 7.5 MG tablet Commonly known as: REMERON Take 1 tablet (7.5 mg total) by mouth at bedtime. What changed: when to take this   multivitamin with minerals tablet Take 1 tablet by mouth daily.   omeprazole 20 MG capsule Commonly known as: PRILOSEC Take 20 mg by mouth daily.   oxyCODONE 5 MG immediate release tablet Commonly known as: Oxy IR/ROXICODONE Take 1 tablet (5 mg total) by mouth every 4 (four) hours as needed for moderate pain or severe pain.   risperiDONE 0.5 MG tablet Commonly known as: RISPERDAL Take 1 tablet (0.5 mg total) by mouth every 12 (twelve) hours as needed (Restlessness and agitation). What changed: when to take this        Follow-up Information     [name], MD Follow up.   Specialty: Internal  Medicine Contact information: [address] [place] [address] [place], [place] Follow up.   Contact information: [address] [place] [address] [phone]                Discharge Exam: BP (!) 142/69 (BP Location: Right Arm)   Pulse (!) 104   Temp 98.7 F (37.1 C) (Axillary)   Resp (!) 8   Ht '6\' 1"'$  (1.854 m)   Wt 47 kg   SpO2 96%   BMI 13.67 kg/m   Chronically ill-appearing, frail, cachectic male in no distress. Resting with head under covers and will speak with me briefly saying he's "alright." Hasn't allow any patient care overnight. Refuses to allow me to examine him.  Condition at discharge:  Hemodynamically stable for transport to hospice. Extremely guarded prognosis.  The results of significant diagnostics from this hospitalization (including imaging, microbiology, ancillary and laboratory) are listed below for reference.   Imaging Studies: No  results found.  Microbiology: Results for orders placed or performed during the hospital encounter of 10/23/21  Surgical pcr screen     Status: None   Collection Time: 10/23/21  6:19 AM   Specimen: Nasal Mucosa; Nasal Swab  Result Value Ref Range Status   MRSA, PCR NEGATIVE NEGATIVE Final   Staphylococcus aureus NEGATIVE NEGATIVE Final    Comment: (NOTE) The Xpert SA Assay (FDA approved for NASAL specimens in patients 61 years of age and older), is one component of a comprehensive surveillance program. It is not intended to diagnose infection nor to guide or monitor treatment. Performed at [facility], [place] [address], [place], [place] [address]     Labs: CBC: Recent Labs  Lab 10/23/21 0615  WBC 11.1*  HGB 6.7*  HCT 19.7*  MCV 91.6  PLT 604   Basic Metabolic Panel: Recent Labs  Lab 10/23/21 0615  NA 139  K 2.7*  CL 100  CO2 27  GLUCOSE 54*  BUN 35*  CREATININE 1.79*  CALCIUM 8.1*   Liver Function Tests: Recent Labs  Lab  10/23/21 0615  AST 28  ALT 21  ALKPHOS 74  BILITOT 0.5  PROT 6.9  ALBUMIN 2.3*   CBG: Recent Labs  Lab 10/23/21 0738 10/23/21 1132 10/23/21 1829 10/23/21 2110 10/24/21 0617  GLUCAP 74 63* 71 64* 60*    Discharge time spent: greater than 30 minutes.  Signed: Patrecia Pour, MD [facility] 10/24/2021

## 2021-10-26 LAB — URINE CULTURE: Culture: >=100000 — AB

## 2021-10-27 ENCOUNTER — Ambulatory Visit: Payer: Medicare Other | Admitting: Student

## 2021-10-27 DIAGNOSIS — I739 Peripheral vascular disease, unspecified: Secondary | ICD-10-CM | POA: Diagnosis not present

## 2021-10-27 DIAGNOSIS — F0390 Unspecified dementia without behavioral disturbance: Secondary | ICD-10-CM | POA: Diagnosis not present

## 2021-10-27 DIAGNOSIS — N390 Urinary tract infection, site not specified: Secondary | ICD-10-CM | POA: Diagnosis not present

## 2021-10-31 ENCOUNTER — Telehealth: Payer: Self-pay

## 2021-10-31 NOTE — Telephone Encounter (Signed)
Received a call from Mrs. Ortner who is interested in possibly rescheduling patient for a right above knee amputation that did not take place on 10/23/21, she reports because of his physical condition. She would like for patient to hear from Dr. Virl Cagey what his best options are versus her relaying the information to him. Appointment scheduled for patient to see Dr. Virl Cagey on Oct. 13.

## 2021-11-03 ENCOUNTER — Ambulatory Visit: Payer: Medicare Other | Admitting: Vascular Surgery

## 2021-11-03 ENCOUNTER — Encounter: Payer: Self-pay | Admitting: Vascular Surgery

## 2021-11-03 VITALS — BP 144/94 | HR 122 | Temp 97.7°F | Resp 20 | Ht 73.0 in | Wt 103.0 lb

## 2021-11-03 DIAGNOSIS — I70261 Atherosclerosis of native arteries of extremities with gangrene, right leg: Secondary | ICD-10-CM

## 2021-11-03 NOTE — Progress Notes (Signed)
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  Today accompanied by his wife and his brother.  He wanted to discuss the risks and benefits of surgery and outlook after.  No new complaints, continues to use the leg to stand and pivot.  Denies fever, denies chills  Past Medical History:  Diagnosis Date   Anemia    Arthritis    Carotid stenosis    Chronic back pain    Chronic HFrEF (heart failure with reduced ejection fraction) ([place])    a. 09/2020 Echo: EF 45%, mild LVH; b. 12/2020 Echo: EF 25-30%, glob HK, mod LVH, mildly reduced RV fxn, RVSP 65.64mHg, mod BAE. Triv effusion. Mod MR/TR.   CKD (chronic kidney disease), stage III ([place]    Constipation    Dementia ([place]    Diabetes mellitus    Dilated cardiomyopathy ([facility]    a. 09/2020 Echo: EF 45%; b. 12/2020 Echo: EF 25-30%.   Foot drop, right    GERD (gastroesophageal reflux disease)    History of kidney stones    Hypertension    Lung nodule    PAF (paroxysmal atrial fibrillation) (HCC)    Peripheral neuropathy    Peripheral vascular disease ([place]    a. 06/2020 s/p L AKA; b. 10/2020 s/p R SFA and above-knee popliteal PTA.   PTSD (post-traumatic stress disorder)    Schizophrenia ([place]    Tuberculosis    Treated    Past Surgical History:  Procedure Laterality Date   ABDOMINAL AORTOGRAM W/LOWER EXTREMITY N/A 05/20/2020   Procedure: ABDOMINAL AORTOGRAM W/LOWER EXTREMITY;  Surgeon: FElam Dutch MD;  [facility];  Service: Cardiovascular;  Laterality: N/A;   ABDOMINAL AORTOGRAM W/LOWER EXTREMITY N/A 10/27/2020   Procedure: ABDOMINAL AORTOGRAM W/LOWER EXTREMITY;  Surgeon: CMarty Heck MD;  [facility];   Service: Cardiovascular;  Laterality: N/A;   ABDOMINAL AORTOGRAM W/LOWER EXTREMITY Right 07/26/2021   Procedure: ABDOMINAL AORTOGRAM W/LOWER EXTREMITY;  Surgeon: RBroadus Derak MD;  [facility];  Service: Cardiovascular;  Laterality: Right;   AMPUTATION Left 06/28/2020   Procedure: AMPUTATION ABOVE KNEE LEFT;  Surgeon: ERosetta Posner MD;  [place] [facility]  Service: Vascular;  Laterality: Left;   [place] 2000, 2013   x2   BLADDER SURGERY     02   CATARACT EXTRACTION W/PHACO Right 05/12/2021   Procedure: CATARACT EXTRACTION PHACO AND INTRAOCULAR LENS PLACEMENT ([place];  Surgeon: WBaruch Goldmann MD;  [facility];  Service: Ophthalmology;  Laterality: Right;  CDE: 27.28   CATARACT EXTRACTION W/PHACO Left 05/29/2021   Procedure: CATARACT EXTRACTION PHACO AND INTRAOCULAR LENS PLACEMENT (IOC);  Surgeon: WBaruch Goldmann MD;  Location: [facility];  Service: Ophthalmology;  Laterality: Left;  CDE  12.96   CHOLECYSTECTOMY     COLONOSCOPY     COLONOSCOPY N/A 12/07/2014   Procedure: COLONOSCOPY;  Surgeon: RDaneil Dolin MD;  [facility];  Service: Endoscopy;  Laterality: N/A;  10:30 Am   ENDOVASCULAR REPAIR/STENT GRAFT Right 07/26/2021   Procedure: ENDOVASCULAR REPAIR/STENT GRAFT;  Surgeon: RBroadus Moksh MD;  [facility];  Service: Cardiovascular;  Laterality: Right;  SFA   EYE SURGERY Bilateral  removed metal from eye   FEMORAL-TIBIAL BYPASS GRAFT Left 05/27/2020   Procedure: LEFT FEMORAL TO PERONEAL ARTERY BYPASS;  Surgeon: Rosetta Posner, MD;  Location: [place];  Service: Vascular;  Laterality: Left;   HERNIA REPAIR Right    INGUINAL HERNIA REPAIR Left 11/03/2013   Procedure: HERNIA REPAIR INGUINAL ADULT;  Surgeon: [place] Curry, MD;  Location: [place];  Service: General;  Laterality: Left;   IRRIGATION AND DEBRIDEMENT FOOT Right 08/17/2021   Procedure: IRRIGATION AND DEBRIDEMENT ULCER RIGHT LEG APPLICATION SKIN SUBSTITUTE;  Surgeon:  Criselda Peaches, DPM;  [facility];  Service: Podiatry;  Laterality: Right;   PERIPHERAL VASCULAR BALLOON ANGIOPLASTY Right 07/26/2021   Procedure: PERIPHERAL VASCULAR BALLOON ANGIOPLASTY;  Surgeon: Allen Betzalel, MD;  Location: [facility];  Service: Cardiovascular;  Laterality: Right;  PERONEAL   PERIPHERAL VASCULAR INTERVENTION  10/27/2020   Procedure: PERIPHERAL VASCULAR INTERVENTION;  Surgeon: Marty Heck, MD;  Location: [facility];  Service: Cardiovascular;;   SHOULDER SURGERY     RIGHT SHOULDER    TRANSMETATARSAL AMPUTATION Right 08/17/2021   Procedure: TRANSMETATARSAL AMPUTATION;  Surgeon: Criselda Peaches, DPM;  [facility];  Service: Podiatry;  Laterality: Right;   VIDEO BRONCHOSCOPY WITH ENDOBRONCHIAL NAVIGATION N/A 07/10/2019   Procedure: VIDEO BRONCHOSCOPY WITH ENDOBRONCHIAL NAVIGATION;  Surgeon: Melrose Nakayama, MD;  Location: [place];  Service: Thoracic;  Laterality: N/A;    Social History   Socioeconomic History   Marital status: Married    Spouse name: [name]   Number of children: Not on file   Years of education: Not on file   Highest education level: Not on file  Occupational History   Not on file  Tobacco Use   Smoking status: Every Day    Packs/day: 0.50    Years: 45.00    Total pack years: 22.50    Types: Cigarettes   Smokeless tobacco: Never   Tobacco comments:    burns them up  Vaping Use   Vaping Use: Never used  Substance and Sexual Activity   Alcohol use: No    Alcohol/week: 0.0 standard drinks of alcohol   Drug use: Yes    Types: Marijuana    Comment: almost daily for pain   Sexual activity: Never  Other Topics Concern   Not on file  Social History Narrative   Service connected veteran    Cared for at home by [name]   In home with twin (boy, girl) grandchildren of [name]   Social Determinants of Health   Financial Resource Strain: Low Risk  (08/16/2021)   Overall Financial Resource Strain (CARDIA)     Difficulty of Paying Living Expenses: Not hard at all  Food Insecurity: No Food Insecurity (09/05/2021)   Hunger Vital Sign    Worried About Running Out of Food in the Last Year: Never true    Ran Out of Food in the Last Year: Never true  Transportation Needs: No Transportation Needs (09/05/2021)   PRAPARE - Hydrologist (Medical): No    Lack of Transportation (Non-Medical): No  Physical Activity: Not on file  Stress: Stress Concern Present (09/13/2021)   [place]    Feeling of Stress : To some extent  Social Connections: Socially Integrated (08/28/2021)   Social Connection and Isolation Panel [NHANES]    Frequency of Communication with Friends and Family: Three times a week    Frequency of  Social Gatherings with Friends and Family: Three times a week    Attends Religious Services: 1 to 4 times per year    Active Member of Clubs or Organizations: Yes    Attends Archivist Meetings: 1 to 4 times per year    Marital Status: Married  Human resources officer Violence: Not At Risk (08/28/2021)   Humiliation, Afraid, Rape, and Kick questionnaire    Fear of Current or Ex-Partner: No    Emotionally Abused: No    Physically Abused: No    Sexually Abused: No   Family History  Problem Relation Age of Onset   Heart disease Mother        before age 45    Current Outpatient Medications  Medication Sig Dispense Refill   acetaminophen (TYLENOL) 500 MG tablet Take 500 mg by mouth 2 (two) times daily as needed for moderate pain.     atorvastatin (LIPITOR) 40 MG tablet Take 1 tablet (40 mg total) by mouth at bedtime. 90 tablet 0   clopidogrel (PLAVIX) 75 MG tablet Take 1 tablet (75 mg total) by mouth daily. 90 tablet 3   donepezil (ARICEPT) 10 MG tablet Take 10 mg by mouth at bedtime.     feeding supplement, ENSURE COMPLETE, (ENSURE COMPLETE) LIQD Take 237 mLs by mouth 2 (two) times daily between  meals. (Patient taking differently: Take 237 mLs by mouth 2 (two) times daily as needed (Between meals).) 23700 mL 1   ferrous sulfate 325 (65 FE) MG tablet Take 325 mg by mouth daily with breakfast.     gabapentin (NEURONTIN) 100 MG capsule Take 1 capsule (100 mg total) by mouth 2 (two) times daily. 60 capsule 3   insulin glargine (LANTUS) 100 UNIT/ML injection Inject 18 Units into the skin daily.     Metoprolol Tartrate 37.5 MG TABS Take 37.5 mg by mouth 3 (three) times daily. 90 tablet 1   Multiple Vitamins-Minerals (MULTIVITAMIN WITH MINERALS) tablet Take 1 tablet by mouth daily. 120 tablet 2   omeprazole (PRILOSEC) 20 MG capsule Take 20 mg by mouth daily.     oxyCODONE (OXY IR/ROXICODONE) 5 MG immediate release tablet Take 1 tablet (5 mg total) by mouth every 4 (four) hours as needed for moderate pain or severe pain. (Patient not taking: Reported on 11/03/2021) 10 tablet 0   risperiDONE (RISPERDAL) 0.5 MG tablet Take 1 tablet (0.5 mg total) by mouth every 12 (twelve) hours as needed (Restlessness and agitation). (Patient taking differently: Take 0.5 mg by mouth at bedtime.) 60 tablet 1   No current facility-administered medications for this visit.    Allergies  Allergen Reactions   Bee Venom Anaphylaxis   Codeine Other (See Comments)    incoherent  Other reaction(s): Delirium    Propoxyphene Other (See Comments)    Dizziness, "Makes me feel drunk" Other reaction(s): Dizziness    Valsartan Other (See Comments)    incoherent Other reaction(s): Delirium      REVIEW OF SYSTEMS:  '[X]'$  denotes positive finding, '[ ]'$  denotes negative finding Cardiac  Comments:  Chest pain or chest pressure:    Shortness of breath upon exertion:    Short of breath when lying flat:    Irregular heart rhythm:        Vascular    Pain in calf, thigh, or hip brought on by ambulation:    Pain in feet at night that wakes you up from your sleep:     Blood clot in your veins:    Leg  swelling:          Pulmonary    Oxygen at home:    Productive cough:     Wheezing:         Neurologic    Sudden weakness in arms or legs:     Sudden numbness in arms or legs:     Sudden onset of difficulty speaking or slurred speech:    Temporary loss of vision in one eye:     Problems with dizziness:         Gastrointestinal    Blood in stool:     Vomited blood:         Genitourinary    Burning when urinating:     Blood in urine:        Psychiatric    Major depression:         Hematologic    Bleeding problems:    Problems with blood clotting too easily:        Skin    Rashes or ulcers:        Constitutional    Fever or chills:      PHYSICAL EXAMINATION:  Vitals:   11/03/21 0909  BP: (!) 144/94  Pulse: (!) 122  Resp: 20  Temp: 97.7 F (36.5 C)  SpO2: 94%  Weight: 103 lb (46.7 kg)  Height: '6\' 1"'$  (1.854 m)    General: May stated, failure to thrive Gait: Not observed HENT: WNL, normocephalic Pulmonary: normal non-labored breathing , without wheezing Cardiac: regular HR Abdomen: soft, NT, no masses Skin: without rashes Vascular Exam/Pulses: Right gangrene right foot  Extremities: There are muscle wasting, dry gangrene right foot Musculoskeletal: no muscle wasting or atrophy  Neurologic: A&O X 3;  No focal weakness or paresthesias are detected Psychiatric:  The pt has Normal affect.   ASSESSMENT/PLAN: KHANG HANNUM is a 76 y.o. 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  Allen Yu is emaciated with failure to thrive.  His right-sided above-knee amputation was canceled due to metabolic derangements and overall poor clinical status.  At the time, I had a long conversation with Allen Yu and his wife regarding his outlook.  With his current comorbidities, I think he has less than a year left.  Both he and his wife are aware that this is only an estimation.  As part of our discussion, we discussed above-knee  amputation versus palliative care.  Allen Yu wants to remain home for as long as possible and wants to travel.  We discussed that with his current health, and being bilateral amputee, he would likely not be able to return home due to needing a Hoyer lift, and significant care around the clock.  At this time, he would like to defer amputation, and continue with palliative services.   Allen Iver, MD [facility] [phone]

## 2021-11-14 DIAGNOSIS — R338 Other retention of urine: Secondary | ICD-10-CM | POA: Diagnosis not present

## 2021-11-27 ENCOUNTER — Inpatient Hospital Stay (HOSPITAL_COMMUNITY)
Admission: EM | Admit: 2021-11-27 | Discharge: 2021-11-30 | DRG: 291 | Disposition: A | Discharge status: Hospice | Payer: Medicare Other | Attending: Family Medicine | Admitting: Family Medicine

## 2021-11-27 ENCOUNTER — Emergency Department (HOSPITAL_COMMUNITY): Payer: Medicare Other

## 2021-11-27 ENCOUNTER — Other Ambulatory Visit (HOSPITAL_COMMUNITY): Payer: Medicare Other

## 2021-11-27 ENCOUNTER — Other Ambulatory Visit: Payer: Self-pay

## 2021-11-27 ENCOUNTER — Encounter (HOSPITAL_COMMUNITY): Payer: Self-pay | Admitting: Emergency Medicine

## 2021-11-27 DIAGNOSIS — Z888 Allergy status to other drugs, medicaments and biological substances status: Secondary | ICD-10-CM

## 2021-11-27 DIAGNOSIS — F209 Schizophrenia, unspecified: Secondary | ICD-10-CM | POA: Diagnosis not present

## 2021-11-27 DIAGNOSIS — G8929 Other chronic pain: Secondary | ICD-10-CM | POA: Diagnosis present

## 2021-11-27 DIAGNOSIS — D649 Anemia, unspecified: Secondary | ICD-10-CM | POA: Diagnosis present

## 2021-11-27 DIAGNOSIS — I5043 Acute on chronic combined systolic (congestive) and diastolic (congestive) heart failure: Secondary | ICD-10-CM | POA: Diagnosis present

## 2021-11-27 DIAGNOSIS — R0789 Other chest pain: Secondary | ICD-10-CM | POA: Diagnosis not present

## 2021-11-27 DIAGNOSIS — E11649 Type 2 diabetes mellitus with hypoglycemia without coma: Secondary | ICD-10-CM | POA: Diagnosis not present

## 2021-11-27 DIAGNOSIS — Z66 Do not resuscitate: Secondary | ICD-10-CM | POA: Diagnosis present

## 2021-11-27 DIAGNOSIS — I130 Hypertensive heart and chronic kidney disease with heart failure and stage 1 through stage 4 chronic kidney disease, or unspecified chronic kidney disease: Secondary | ICD-10-CM | POA: Diagnosis present

## 2021-11-27 DIAGNOSIS — L97819 Non-pressure chronic ulcer of other part of right lower leg with unspecified severity: Secondary | ICD-10-CM | POA: Diagnosis not present

## 2021-11-27 DIAGNOSIS — I5082 Biventricular heart failure: Secondary | ICD-10-CM | POA: Diagnosis not present

## 2021-11-27 DIAGNOSIS — Z87442 Personal history of urinary calculi: Secondary | ICD-10-CM

## 2021-11-27 DIAGNOSIS — I358 Other nonrheumatic aortic valve disorders: Secondary | ICD-10-CM | POA: Diagnosis not present

## 2021-11-27 DIAGNOSIS — R071 Chest pain on breathing: Secondary | ICD-10-CM | POA: Diagnosis not present

## 2021-11-27 DIAGNOSIS — E876 Hypokalemia: Secondary | ICD-10-CM | POA: Diagnosis present

## 2021-11-27 DIAGNOSIS — Z7902 Long term (current) use of antithrombotics/antiplatelets: Secondary | ICD-10-CM

## 2021-11-27 DIAGNOSIS — E1142 Type 2 diabetes mellitus with diabetic polyneuropathy: Secondary | ICD-10-CM | POA: Diagnosis present

## 2021-11-27 DIAGNOSIS — Z89612 Acquired absence of left leg above knee: Secondary | ICD-10-CM | POA: Diagnosis not present

## 2021-11-27 DIAGNOSIS — Z79899 Other long term (current) drug therapy: Secondary | ICD-10-CM

## 2021-11-27 DIAGNOSIS — K761 Chronic passive congestion of liver: Secondary | ICD-10-CM | POA: Diagnosis present

## 2021-11-27 DIAGNOSIS — Z794 Long term (current) use of insulin: Secondary | ICD-10-CM | POA: Diagnosis not present

## 2021-11-27 DIAGNOSIS — I4892 Unspecified atrial flutter: Secondary | ICD-10-CM | POA: Diagnosis not present

## 2021-11-27 DIAGNOSIS — R079 Chest pain, unspecified: Secondary | ICD-10-CM | POA: Diagnosis not present

## 2021-11-27 DIAGNOSIS — R0602 Shortness of breath: Secondary | ICD-10-CM | POA: Diagnosis not present

## 2021-11-27 DIAGNOSIS — E785 Hyperlipidemia, unspecified: Secondary | ICD-10-CM | POA: Diagnosis not present

## 2021-11-27 DIAGNOSIS — N1832 Chronic kidney disease, stage 3b: Secondary | ICD-10-CM | POA: Diagnosis not present

## 2021-11-27 DIAGNOSIS — I96 Gangrene, not elsewhere classified: Secondary | ICD-10-CM | POA: Diagnosis not present

## 2021-11-27 DIAGNOSIS — E1122 Type 2 diabetes mellitus with diabetic chronic kidney disease: Secondary | ICD-10-CM | POA: Diagnosis present

## 2021-11-27 DIAGNOSIS — F17210 Nicotine dependence, cigarettes, uncomplicated: Secondary | ICD-10-CM | POA: Diagnosis present

## 2021-11-27 DIAGNOSIS — Z515 Encounter for palliative care: Secondary | ICD-10-CM

## 2021-11-27 DIAGNOSIS — E1152 Type 2 diabetes mellitus with diabetic peripheral angiopathy with gangrene: Secondary | ICD-10-CM | POA: Diagnosis present

## 2021-11-27 DIAGNOSIS — R54 Age-related physical debility: Secondary | ICD-10-CM | POA: Diagnosis present

## 2021-11-27 DIAGNOSIS — H9190 Unspecified hearing loss, unspecified ear: Secondary | ICD-10-CM | POA: Diagnosis present

## 2021-11-27 DIAGNOSIS — F0390 Unspecified dementia without behavioral disturbance: Secondary | ICD-10-CM | POA: Diagnosis present

## 2021-11-27 DIAGNOSIS — Z8249 Family history of ischemic heart disease and other diseases of the circulatory system: Secondary | ICD-10-CM

## 2021-11-27 DIAGNOSIS — J90 Pleural effusion, not elsewhere classified: Secondary | ICD-10-CM | POA: Diagnosis not present

## 2021-11-27 DIAGNOSIS — I739 Peripheral vascular disease, unspecified: Secondary | ICD-10-CM | POA: Diagnosis not present

## 2021-11-27 DIAGNOSIS — I480 Paroxysmal atrial fibrillation: Secondary | ICD-10-CM | POA: Diagnosis not present

## 2021-11-27 DIAGNOSIS — I4819 Other persistent atrial fibrillation: Secondary | ICD-10-CM | POA: Diagnosis present

## 2021-11-27 DIAGNOSIS — M549 Dorsalgia, unspecified: Secondary | ICD-10-CM | POA: Diagnosis present

## 2021-11-27 DIAGNOSIS — I5021 Acute systolic (congestive) heart failure: Secondary | ICD-10-CM | POA: Diagnosis not present

## 2021-11-27 DIAGNOSIS — F4310 Post-traumatic stress disorder, unspecified: Secondary | ICD-10-CM | POA: Diagnosis present

## 2021-11-27 DIAGNOSIS — J9811 Atelectasis: Secondary | ICD-10-CM | POA: Diagnosis not present

## 2021-11-27 DIAGNOSIS — Z885 Allergy status to narcotic agent status: Secondary | ICD-10-CM

## 2021-11-27 DIAGNOSIS — Z7189 Other specified counseling: Secondary | ICD-10-CM | POA: Diagnosis not present

## 2021-11-27 DIAGNOSIS — I493 Ventricular premature depolarization: Secondary | ICD-10-CM | POA: Diagnosis present

## 2021-11-27 LAB — BASIC METABOLIC PANEL
Anion gap: 14 (ref 5–15)
BUN: 40 mg/dL — ABNORMAL HIGH (ref 8–23)
CO2: 20 mmol/L — ABNORMAL LOW (ref 22–32)
Calcium: 8.2 mg/dL — ABNORMAL LOW (ref 8.9–10.3)
Chloride: 103 mmol/L (ref 98–111)
Creatinine, Ser: 1.92 mg/dL — ABNORMAL HIGH (ref 0.61–1.24)
GFR, Estimated: 36 mL/min — ABNORMAL LOW (ref >60)
Glucose, Bld: 183 mg/dL — ABNORMAL HIGH (ref 70–99)
Potassium: 3.8 mmol/L (ref 3.5–5.1)
Sodium: 137 mmol/L (ref 135–145)

## 2021-11-27 LAB — PREPARE RBC (CROSSMATCH)

## 2021-11-27 LAB — CBC
HCT: 21.6 % — ABNORMAL LOW (ref 39.0–52.0)
Hemoglobin: 6.9 g/dL — CL (ref 13.0–17.0)
MCH: 29.2 pg (ref 26.0–34.0)
MCHC: 31.9 g/dL (ref 30.0–36.0)
MCV: 91.5 fL (ref 80.0–100.0)
Platelets: 284 10*3/uL (ref 150–400)
RBC: 2.36 MIL/uL — ABNORMAL LOW (ref 4.22–5.81)
RDW: 16.8 % — ABNORMAL HIGH (ref 11.5–15.5)
WBC: 9 10*3/uL (ref 4.0–10.5)
nRBC: 0 % (ref 0.0–0.2)

## 2021-11-27 LAB — CBG MONITORING, ED: Glucose-Capillary: 194 mg/dL — ABNORMAL HIGH (ref 70–99)

## 2021-11-27 LAB — LACTIC ACID, PLASMA
Lactic Acid, Venous: 2.4 mmol/L (ref 0.5–1.9)
Lactic Acid, Venous: 1.4 mmol/L (ref 0.5–1.9)

## 2021-11-27 LAB — HEPATIC FUNCTION PANEL
ALT: 45 U/L — ABNORMAL HIGH (ref 0–44)
AST: 43 U/L — ABNORMAL HIGH (ref 15–41)
Albumin: 2.5 g/dL — ABNORMAL LOW (ref 3.5–5.0)
Alkaline Phosphatase: 225 U/L — ABNORMAL HIGH (ref 38–126)
Bilirubin, Direct: 0.3 mg/dL — ABNORMAL HIGH (ref 0.0–0.2)
Indirect Bilirubin: 1.2 mg/dL — ABNORMAL HIGH (ref 0.3–0.9)
Total Bilirubin: 1.5 mg/dL — ABNORMAL HIGH (ref 0.3–1.2)
Total Protein: 6.7 g/dL (ref 6.5–8.1)

## 2021-11-27 LAB — BRAIN NATRIURETIC PEPTIDE: B Natriuretic Peptide: 4086 pg/mL — ABNORMAL HIGH (ref 0.0–100.0)

## 2021-11-27 LAB — TROPONIN I (HIGH SENSITIVITY)
Troponin I (High Sensitivity): 21 ng/L — ABNORMAL HIGH (ref <18)
Troponin I (High Sensitivity): 22 ng/L — ABNORMAL HIGH (ref <18)

## 2021-11-27 MED ORDER — IOHEXOL 350 MG/ML SOLN
80.0000 mL | Freq: Once | INTRAVENOUS | Status: AC | PRN
  Administered 2021-11-27: 80 mL via INTRAVENOUS

## 2021-11-27 MED ORDER — SODIUM CHLORIDE 0.9 % IV SOLN
10.0000 mL/h | Freq: Once | INTRAVENOUS | Status: AC
  Administered 2021-11-27: 10 mL/h via INTRAVENOUS

## 2021-11-27 MED ORDER — SODIUM CHLORIDE 0.9 % IV SOLN
2.0000 g | INTRAVENOUS | Status: DC
  Administered 2021-11-27 – 2021-11-28 (×2): 2 g via INTRAVENOUS
  Filled 2021-11-27 (×2): qty 20

## 2021-11-27 MED ORDER — SODIUM CHLORIDE 0.9 % IV BOLUS
1000.0000 mL | Freq: Once | INTRAVENOUS | Status: AC
  Administered 2021-11-27: 1000 mL via INTRAVENOUS

## 2021-11-27 MED ORDER — SODIUM CHLORIDE 0.9 % IV SOLN: Freq: Once | INTRAVENOUS | Status: DC

## 2021-11-27 MED ORDER — METRONIDAZOLE 500 MG/100ML IV SOLN
500.0000 mg | Freq: Two times a day (BID) | INTRAVENOUS | Status: DC
  Administered 2021-11-27 – 2021-11-29 (×4): 500 mg via INTRAVENOUS
  Filled 2021-11-27 (×4): qty 100

## 2021-11-27 NOTE — ED Triage Notes (Signed)
Pt with wife. Pt has hx of dementia, schizophrenia. Pt agitated. Wife states pt was c/o chest pain earlier today. She checked 02 and was 72% and she put him on 02 at home and it came up. Wife denies patient c/o nausea/sweating. Pt aggravated with any questions asked. Pt is non diaphoretic at this time.

## 2021-11-27 NOTE — ED Notes (Addendum)
Patient has bowel movement. Patient provided with perineal care with brief changed and catheter care. 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

## 2021-11-27 NOTE — ED Notes (Signed)
2 iv attempts by this nurse; unsuccessful. [place] IV RN to bedside.

## 2021-11-27 NOTE — H&P (Signed)
TRH H&P    Patient Demographics:    Allen Yu, is a 76 y.o. male  MRN: [id number]  DOB - [birth year]  Admit Date - 11/27/2021  Referring MD/NP/PA: Evlyn Kanner  Outpatient Primary MD for the patient is [name], MD  Patient coming from: Home  Chief complaint-chest pain   HPI:    Allen Yu  is a 76 y.o. male, with medical history of dementia, PVD s/p left AKA, urinary retention with chronic Foley catheter, diabetes mellitus type 2, atrial fibrillation, chronic dry right gangrene, recently seen by vascular surgery, and patient refused amputation and decided to continue with palliative care as outpatient, came to ED with complaints of chest pain and also decreased oxygen saturation at home.  Patient is a poor historian, does complain of chest pain, denies nausea vomiting or diarrhea.  No abdominal pain.   In the ED, lab work revealed BNP of 4000, troponin 21, 22.  EKG was unremarkable.  Chest x-ray showed pulmonary edema and small bilateral pleural effusions.    Review of systems:    In addition to the HPI above,    All other systems reviewed and are negative.    Past History of the following :    Past Medical History:  Diagnosis Date   Anemia    Arthritis    Carotid stenosis    Chronic back pain    Chronic HFrEF (heart failure with reduced ejection fraction) ([facility])    a. 09/2020 Echo: EF 45%, mild LVH; b. 12/2020 Echo: EF 25-30%, glob HK, mod LVH, mildly reduced RV fxn, RVSP 65.75mHg, mod BAE. Triv effusion. Mod MR/TR.   CKD (chronic kidney disease), stage III ([facility]    Constipation    Dementia ([facility]    Diabetes mellitus    Dilated cardiomyopathy ([facility] [place]    a. 09/2020 Echo: EF 45%; b. 12/2020 Echo: EF 25-30%.   Foot drop, right    GERD (gastroesophageal reflux disease)    History of kidney stones    Hypertension    Lung nodule    PAF (paroxysmal atrial fibrillation) (HCC)     Peripheral neuropathy    Peripheral vascular disease ([facility]    a. 06/2020 s/p L AKA; b. 10/2020 s/p R SFA and above-knee popliteal PTA.   PTSD (post-traumatic stress disorder)    Schizophrenia ([facility]    Tuberculosis    Treated      Past Surgical History:  Procedure Laterality Date   ABDOMINAL AORTOGRAM W/LOWER EXTREMITY N/A 05/20/2020   Procedure: ABDOMINAL AORTOGRAM W/LOWER EXTREMITY;  Surgeon: FElam Dutch MD;  [facility];  Service: Cardiovascular;  Laterality: N/A;   ABDOMINAL AORTOGRAM W/LOWER EXTREMITY N/A 10/27/2020   Procedure: ABDOMINAL AORTOGRAM W/LOWER EXTREMITY;  Surgeon: CMarty Heck MD;  [facility];  Service: Cardiovascular;  Laterality: N/A;   ABDOMINAL AORTOGRAM W/LOWER EXTREMITY Right 07/26/2021   Procedure: ABDOMINAL AORTOGRAM W/LOWER EXTREMITY;  Surgeon: RBroadus Eian MD;  [facility];  Service: Cardiovascular;  Laterality: Right;   AMPUTATION Left 06/28/2020  Procedure: AMPUTATION ABOVE KNEE LEFT;  Surgeon: Rosetta Posner, MD;  Location: [facility];  Service: Vascular;  Laterality: Left;   [place]  2000, 2013   x2   BLADDER SURGERY     02   CATARACT EXTRACTION W/PHACO Right 05/12/2021   Procedure: CATARACT EXTRACTION PHACO AND INTRAOCULAR LENS PLACEMENT ([place]);  Surgeon: Baruch Goldmann, MD;  Location: [facility];  Service: Ophthalmology;  Laterality: Right;  CDE: 27.28   CATARACT EXTRACTION W/PHACO Left 05/29/2021   Procedure: CATARACT EXTRACTION PHACO AND INTRAOCULAR LENS PLACEMENT ([facility]);  Surgeon: Baruch Goldmann, MD;  Location: [facility];  Service: Ophthalmology;  Laterality: Left;  CDE  12.96   CHOLECYSTECTOMY     COLONOSCOPY     COLONOSCOPY N/A 12/07/2014   Procedure: COLONOSCOPY;  Surgeon: Daneil Dolin, MD;  [facility];  Service: Endoscopy;  Laterality: N/A;  10:30 Am   ENDOVASCULAR REPAIR/STENT GRAFT Right 07/26/2021   Procedure: ENDOVASCULAR REPAIR/STENT GRAFT;  Surgeon:  Broadus Keavon, MD;  Location: [facility];  Service: Cardiovascular;  Laterality: Right;  SFA   EYE SURGERY Bilateral    removed metal from eye   FEMORAL-TIBIAL BYPASS GRAFT Left 05/27/2020   Procedure: LEFT FEMORAL TO PERONEAL ARTERY BYPASS;  Surgeon: Rosetta Posner, MD;  Location: [facility];  Service: Vascular;  Laterality: Left;   HERNIA REPAIR Right    INGUINAL HERNIA REPAIR Left 11/03/2013   Procedure: HERNIA REPAIR INGUINAL ADULT;  Surgeon: Gayland Curry, MD;  Location: [facility];  Service: General;  Laterality: Left;   IRRIGATION AND DEBRIDEMENT FOOT Right 08/17/2021   Procedure: IRRIGATION AND DEBRIDEMENT ULCER RIGHT LEG APPLICATION SKIN SUBSTITUTE;  Surgeon: Criselda Peaches, DPM;  Location: [facility];  Service: Podiatry;  Laterality: Right;   PERIPHERAL VASCULAR BALLOON ANGIOPLASTY Right 07/26/2021   Procedure: PERIPHERAL VASCULAR BALLOON ANGIOPLASTY;  Surgeon: Broadus Torey, MD;  Location: [facility];  Service: Cardiovascular;  Laterality: Right;  PERONEAL   PERIPHERAL VASCULAR INTERVENTION  10/27/2020   Procedure: PERIPHERAL VASCULAR INTERVENTION;  Surgeon: Marty Heck, MD;  Location: [facility];  Service: Cardiovascular;;   SHOULDER SURGERY     RIGHT SHOULDER    TRANSMETATARSAL AMPUTATION Right 08/17/2021   Procedure: TRANSMETATARSAL AMPUTATION;  Surgeon: Criselda Peaches, DPM;  [facility];  Service: Podiatry;  Laterality: Right;   VIDEO BRONCHOSCOPY WITH ENDOBRONCHIAL NAVIGATION N/A 07/10/2019   Procedure: VIDEO BRONCHOSCOPY WITH ENDOBRONCHIAL NAVIGATION;  Surgeon: Melrose Nakayama, MD;  Location: [facility];  Service: Thoracic;  Laterality: N/A;      Social History:      Social History   Tobacco Use   Smoking status: Every Day    Packs/day: 0.50    Years: 45.00    Total pack years: 22.50    Types: Cigarettes   Smokeless tobacco: Never   Tobacco comments:    burns them up  Substance Use Topics   Alcohol use: No    Alcohol/week: 0.0  standard drinks of alcohol       Family History :     Family History  Problem Relation Age of Onset   Heart disease Mother        before age 36      Home Medications:   Prior to Admission medications   Medication Sig Start Date End Date Taking? Authorizing Provider  acetaminophen (TYLENOL) 500 MG tablet Take 500 mg by mouth 2 (two) times daily as needed for moderate  pain. 03/07/21   [provider]  atorvastatin (LIPITOR) 40 MG tablet Take 1 tablet (40 mg total) by mouth at bedtime. 07/14/21 10/17/21  Strader, Fransisco Hertz, PA-C  clopidogrel (PLAVIX) 75 MG tablet Take 1 tablet (75 mg total) by mouth daily. 07/07/21 07/07/22  Roxan Hockey, MD  donepezil (ARICEPT) 10 MG tablet Take 10 mg by mouth at bedtime.    [provider]  feeding supplement, ENSURE COMPLETE, (ENSURE COMPLETE) LIQD Take 237 mLs by mouth 2 (two) times daily between meals. Patient taking differently: Take 237 mLs by mouth 2 (two) times daily as needed (Between meals). 01/19/21   Roxan Hockey, MD  ferrous sulfate 325 (65 FE) MG tablet Take 325 mg by mouth daily with breakfast. 03/07/21   [provider]  gabapentin (NEURONTIN) 100 MG capsule Take 1 capsule (100 mg total) by mouth 2 (two) times daily. 07/07/21   Roxan Hockey, MD  insulin glargine (LANTUS) 100 UNIT/ML injection Inject 18 Units into the skin daily.    [provider]  Metoprolol Tartrate 37.5 MG TABS Take 37.5 mg by mouth 3 (three) times daily. 09/15/21 10/17/21  Regalado, Jerald Kief A, MD  Multiple Vitamins-Minerals (MULTIVITAMIN WITH MINERALS) tablet Take 1 tablet by mouth daily. 01/19/21 01/19/22  Roxan Hockey, MD  omeprazole (PRILOSEC) 20 MG capsule Take 20 mg by mouth daily. 03/23/21   [provider]  oxyCODONE (OXY IR/ROXICODONE) 5 MG immediate release tablet Take 1 tablet (5 mg total) by mouth every 4 (four) hours as needed for moderate pain or severe pain. Patient not taking: Reported on 11/03/2021  10/24/21   Patrecia Pour, MD  risperiDONE (RISPERDAL) 0.5 MG tablet Take 1 tablet (0.5 mg total) by mouth every 12 (twelve) hours as needed (Restlessness and agitation). Patient taking differently: Take 0.5 mg by mouth at bedtime. 01/19/21   Roxan Hockey, MD     Allergies:     Allergies  Allergen Reactions   Bee Venom Anaphylaxis   Codeine Other (See Comments)    incoherent  Other reaction(s): Delirium    Propoxyphene Other (See Comments)    Dizziness, "Makes me feel drunk" Other reaction(s): Dizziness    Valsartan Other (See Comments)    incoherent Other reaction(s): Delirium      Physical Exam:   Vitals  Blood pressure (!) 130/98, pulse (!) 124, temperature 97.6 F (36.4 C), resp. rate 19, SpO2 94 %.  1.  General: Appears in no acute distress  2. Psychiatric: Alert, oriented x3, intact insight and judgment  3. Neurologic: Cranial nerves II through XII grossly intact, no focal deficit noted  4. HEENMT:  Atraumatic normocephalic, extraocular muscles are intact  5. Respiratory : Clear to auscultation bilaterally  6. Cardiovascular : S1-S2, regular, no murmur auscultated  7. Gastrointestinal:  Abdomen is soft, nontender, no organomegaly  8. Skin:  S/p left above-knee amputation, partially embedded right foot with dry gangrene, foul smell noted multiple chronic wounds       Data Review:    CBC Recent Labs  Lab 11/27/21 1448  WBC 9.0  HGB 6.9*  HCT 21.6*  PLT 284  MCV 91.5  MCH 29.2  MCHC 31.9  RDW 16.8*   ------------------------------------------------------------------------------------------------------------------  Results for orders placed or performed during the hospital encounter of 11/27/21 (from the past 48 hour(s))  Basic metabolic panel     Status: Abnormal   Collection Time: 11/27/21  2:48 PM  Result Value Ref Range   Sodium 137 135 - 145 mmol/L   Potassium 3.8  3.5 - 5.1 mmol/L   Chloride 103 98 - 111 mmol/L   CO2 20 (L)  22 - 32 mmol/L   Glucose, Bld 183 (H) 70 - 99 mg/dL    Comment: Glucose reference range applies only to samples taken after fasting for at least 8 hours.   BUN 40 (H) 8 - 23 mg/dL   Creatinine, Ser 1.92 (H) 0.61 - 1.24 mg/dL   Calcium 8.2 (L) 8.9 - 10.3 mg/dL   GFR, Estimated 36 (L) >60 mL/min    Comment: (NOTE) Calculated using the CKD-EPI Creatinine Equation (2021)    Anion gap 14 5 - 15    Comment: Performed at [facility], [address], [place], [place] [address]  CBC     Status: Abnormal   Collection Time: 11/27/21  2:48 PM  Result Value Ref Range   WBC 9.0 4.0 - 10.5 K/uL   RBC 2.36 (L) 4.22 - 5.81 MIL/uL   Hemoglobin 6.9 (LL) 13.0 - 17.0 g/dL    Comment: This critical result has verified and been called to T.TALBET by Joaquin Courts on 11 06 2023 at 1552, and has been read back.    HCT 21.6 (L) 39.0 - 52.0 %   MCV 91.5 80.0 - 100.0 fL   MCH 29.2 26.0 - 34.0 pg   MCHC 31.9 30.0 - 36.0 g/dL   RDW 16.8 (H) 11.5 - 15.5 %   Platelets 284 150 - 400 K/uL   nRBC 0.0 0.0 - 0.2 %    Comment: Performed at [facility], [address], [place], [place] [address]  Troponin I (High Sensitivity)     Status: Abnormal   Collection Time: 11/27/21  2:48 PM  Result Value Ref Range   Troponin I (High Sensitivity) 21 (H) <18 ng/L    Comment: (NOTE) Elevated high sensitivity troponin I (hsTnI) values and significant  changes across serial measurements may suggest ACS but many other  chronic and acute conditions are known to elevate hsTnI results.  Refer to the "Links" section for chest pain algorithms and additional  guidance. Performed at [facility], [address], [place], [place] [address]   POC CBG, ED     Status: Abnormal   Collection Time: 11/27/21  3:59 PM  Result Value Ref Range   Glucose-Capillary 194 (H) 70 - 99 mg/dL    Comment: Glucose reference range applies only to samples taken after fasting for at least 8 hours.   Comment 1 Notify RN   Hepatic function panel      Status: Abnormal   Collection Time: 11/27/21  5:07 PM  Result Value Ref Range   Total Protein 6.7 6.5 - 8.1 g/dL   Albumin 2.5 (L) 3.5 - 5.0 g/dL   AST 43 (H) 15 - 41 U/L   ALT 45 (H) 0 - 44 U/L   Alkaline Phosphatase 225 (H) 38 - 126 U/L   Total Bilirubin 1.5 (H) 0.3 - 1.2 mg/dL   Bilirubin, Direct 0.3 (H) 0.0 - 0.2 mg/dL   Indirect Bilirubin 1.2 (H) 0.3 - 0.9 mg/dL    Comment: Performed at [facility], [address], [place], [place] [address]  Lactic acid, plasma     Status: Abnormal   Collection Time: 11/27/21  5:07 PM  Result Value Ref Range   Lactic Acid, Venous 2.4 (HH) 0.5 - 1.9 mmol/L    Comment: CRITICAL RESULT CALLED TO, READ BACK BY AND VERIFIED WITH: TALBOTT,T ON 11/27/21 AT 6144 BY LOY,C Performed at [facility], [address] [place], [place] [address]   Blood culture (routine x 2)     Status: None (Preliminary result)   Collection Time: 11/27/21  5:07 PM   Specimen: BLOOD  Result Value Ref Range   Specimen Description BLOOD BLOOD RIGHT WRIST    Special Requests      BOTTLES DRAWN AEROBIC AND ANAEROBIC Blood Culture adequate volume Performed at [facility], [address], [place], [place] [address]    Culture PENDING    Report Status PENDING   Blood culture (routine x 2)     Status: None (Preliminary result)   Collection Time: 11/27/21  5:07 PM   Specimen: BLOOD  Result Value Ref Range   Specimen Description BLOOD BLOOD RIGHT WRIST    Special Requests      BOTTLES DRAWN AEROBIC AND ANAEROBIC Blood Culture adequate volume Performed at [facility], [address] [place], [place] [address]    Culture PENDING    Report Status PENDING   Troponin I (High Sensitivity)     Status: Abnormal   Collection Time: 11/27/21  5:07 PM  Result Value Ref Range   Troponin I (High Sensitivity) 22 (H) <18 ng/L    Comment: (NOTE) Elevated high sensitivity troponin I (hsTnI) values and significant  changes across serial measurements may suggest ACS but many other   chronic and acute conditions are known to elevate hsTnI results.  Refer to the "Links" section for chest pain algorithms and additional  guidance. Performed at [facility], [address] [place], [place] [address]   Prepare RBC (crossmatch)     Status: None   Collection Time: 11/27/21  5:07 PM  Result Value Ref Range   Order Confirmation      ORDER PROCESSED BY BLOOD BANK Performed at [facility], [address], [place], [place] [address]   Type and screen [facility]     Status: None (Preliminary result)   Collection Time: 11/27/21  5:12 PM  Result Value Ref Range   ABO/RH(D) O POS    Antibody Screen NEG    Sample Expiration 11/30/2021,2359    Unit Number [id number]    Blood Component Type RED CELLS,LR    Unit division 00    Status of Unit ISSUED    Transfusion Status OK TO TRANSFUSE    Crossmatch Result      Compatible Performed at [facility], [address] [place], [place] [address]   Brain natriuretic peptide     Status: Abnormal   Collection Time: 11/27/21  5:17 PM  Result Value Ref Range   B Natriuretic Peptide 4,086.0 (H) 0.0 - 100.0 pg/mL    Comment: Performed at [facility], [address], [place], [place] [address]    Chemistries  Recent Labs  Lab 11/27/21 1448 11/27/21 1707  NA 137  --   K 3.8  --   CL 103  --   CO2 20*  --   GLUCOSE 183*  --   BUN 40*  --   CREATININE 1.92*  --   CALCIUM 8.2*  --   AST  --  43*  ALT  --  45*  ALKPHOS  --  225*  BILITOT  --  1.5*   ------------------------------------------------------------------------------------------------------------------  ------------------------------------------------------------------------------------------------------------------ GFR: CrCl cannot be calculated (Unknown ideal weight.). Liver Function Tests: Recent Labs  Lab 11/27/21 1707  AST 43*  ALT 45*  ALKPHOS 225*  BILITOT 1.5*  PROT 6.7  ALBUMIN 2.5*   No results for input(s): "  LIPASE", "AMYLASE" in  the last 168 hours. No results for input(s): "AMMONIA" in the last 168 hours. Coagulation Profile: No results for input(s): "INR", "PROTIME" in the last 168 hours. Cardiac Enzymes: No results for input(s): "CKTOTAL", "CKMB", "CKMBINDEX", "TROPONINI" in the last 168 hours. BNP (last 3 results) No results for input(s): "PROBNP" in the last 8760 hours. HbA1C: No results for input(s): "HGBA1C" in the last 72 hours. CBG: Recent Labs  Lab 11/27/21 1559  GLUCAP 194*   Lipid Profile: No results for input(s): "CHOL", "HDL", "LDLCALC", "TRIG", "CHOLHDL", "LDLDIRECT" in the last 72 hours. Thyroid Function Tests: No results for input(s): "TSH", "T4TOTAL", "FREET4", "T3FREE", "THYROIDAB" in the last 72 hours. Anemia Panel: No results for input(s): "VITAMINB12", "FOLATE", "FERRITIN", "TIBC", "IRON", "RETICCTPCT" in the last 72 hours.  --------------------------------------------------------------------------------------------------------------- Urine analysis:    Component Value Date/Time   COLORURINE YELLOW 10/23/2021 0626   APPEARANCEUR CLOUDY (A) 10/23/2021 0626   LABSPEC 1.008 10/23/2021 0626   PHURINE 6.0 10/23/2021 0626   GLUCOSEU NEGATIVE 10/23/2021 0626   HGBUR MODERATE (A) 10/23/2021 0626   BILIRUBINUR NEGATIVE 10/23/2021 0626   KETONESUR NEGATIVE 10/23/2021 0626   PROTEINUR 100 (A) 10/23/2021 0626   UROBILINOGEN 0.2 02/18/2014 0950   NITRITE POSITIVE (A) 10/23/2021 0626   LEUKOCYTESUR LARGE (A) 10/23/2021 0626      Imaging Results:    CT Angio Chest PE W and/or Wo Contrast  Result Date: 11/27/2021 CLINICAL DATA:  Shortness of breath chest pain. Pulmonary embolism suspected, high probability. EXAM: CT ANGIOGRAPHY CHEST WITH CONTRAST TECHNIQUE: Multidetector CT imaging of the chest was performed using the standard protocol during bolus administration of intravenous contrast. Multiplanar CT image reconstructions and MIPs were obtained to evaluate the vascular anatomy. RADIATION  DOSE REDUCTION: This exam was performed according to the departmental dose-optimization program which includes automated exposure control, adjustment of the mA and/or kV according to patient size and/or use of iterative reconstruction technique. CONTRAST:  64m OMNIPAQUE IOHEXOL 350 MG/ML SOLN COMPARISON:  Chest CTA 06/29/2020.  Chest radiograph 11/27/2021 FINDINGS: Cardiovascular: Negative for pulmonary embolism. Enlargement of the right heart with contrast refluxing into the hepatic veins. Coronary artery calcifications. Normal caliber thoracic aorta with atherosclerotic calcifications. No significant pericardial effusion. Mediastinum/Nodes: No significant mediastinal or hilar lymph node enlargement. No axillary lymph node enlargement. Lungs/Pleura: Large bilateral pleural effusions. Centrilobular emphysema. Severe volume loss and consolidation in left lower lobe. Indeterminate 4 mm nodule in the left lower lobe on image 71/6. Compressive atelectasis in the posterior left upper lobe. Patient had a suspicious pulmonary nodule in the right upper lobe on the prior CT but this area is poorly characterized due to severe volume loss throughout the right lung. Nonspecific anterior pleural-based density in the right lung and image 60/6. Significant volume loss in the right lower lobe with compressive atelectasis from the large pleural effusion. Upper Abdomen: Low-density structures in left kidney upper pole probably represent cysts but poorly characterized. Small calcifications in the kidneys could be vascular in etiology. Evidence for cholecystectomy. Musculoskeletal: Anterior plate and screw fixation in lower cervical spine. No acute bone abnormality. Review of the MIP images confirms the above findings. IMPRESSION: 1. Negative for pulmonary embolism. 2. Large bilateral pleural effusions with severe compressive atelectasis in the lower lobes. 3. Enlargement of the right heart with contrast refluxing into the hepatic  veins. Findings are suggestive for right heart dysfunction. 4. Patient had a suspicious pulmonary nodule in the right upper lobe on the prior CT but this area is poorly characterized due to  the severe volume loss. There is also an indeterminate 4 mm pulmonary nodule in the left lower lobe. Recommend follow-up chest CT after resolution of the pleural effusions. 5. Coronary artery calcifications. 6. Aortic Atherosclerosis (ICD10-I70.0) and Emphysema (ICD10-J43.9). Electronically Signed   By: Markus Daft M.D.   On: 11/27/2021 17:40   DG Chest 1 View  Result Date: 11/27/2021 CLINICAL DATA:  Shortness of breath EXAM: CHEST  1 VIEW COMPARISON:  Chest x-ray dated September 06, 2021 FINDINGS: Cardiac and mediastinal contours are within normal limits for AP technique. Small bilateral pleural effusions and bibasilar atelectasis. No evidence of pneumothorax. IMPRESSION: Small bilateral pleural effusions and bibasilar atelectasis. Electronically Signed   By: Yetta Glassman M.D.   On: 11/27/2021 16:08    My personal review of EKG: Rhythm NSR, nonspecific ST-T changes   Assessment & Plan:    Principal Problem:   Chest pain   Chest pain-presented with chest pain, found to have elevated BNP with requiring 2 L of oxygen via nasal cannula.  Likely in the setting of acute on chronic biventricular heart failure.  Troponin x2 are flat.  We will continue to cycle troponin. Acute on chronic systolic and diastolic heart failure-patient is significantly elevated BNP with chest x-ray showing pulmonary edema with small bilateral pleural effusion.  He is currently requiring 2 L of oxygen via nasal cannula.  Will give 1 dose of Lasix 40 mg IV x1 and assess diuretic response.  Follow renal function in a.m.  Last echo from June 2023 showed EF of 35 to 40% with grade 1 diastolic dysfunction. Chronic right foot dry gangrene-patient was seen by vascular surgery in October, at that time he declined amputation and opted for comfort  measures.  Patient has been started on ceftriaxone and Flagyl for possible infected diabetic foot.  We will continue with antibiotics. Diabetes mellitus type 2-continue Lantus 18 units subcu daily.  Will start sliding scale insulin with NovoLog. Peripheral vascular disease-continue Plavix, hold Lipitor due to transaminitis. Dementia-continue Aricept, Risperdal Atrial fibrillation-heart rate is elevated, it seems patient was not taking metoprolol at home.  We will restart metoprolol 37.5 mg 3 times daily.  No anticoagulation due to recent GI bleed. CKD stage IIIb-creatinine is 1.92, at baseline.  Started on Lasix as above.  Follow renal function in a.m. Transaminitis-AST 43, ALT 45, alk phos 225, bilirubin 1.5.  Likely from hepatic congestion from right heart failure.  Started on Lasix as above.  Follow liver function in a.m.  If LFT continues to be elevated consider abdominal ultrasound in a.m.    DVT Prophylaxis-   Lovenox   AM Labs Ordered, also please review Full Orders  Family Communication: .  No family at bedside, ED provider spoke to patient's wife and confirmed that patient is DNR but they  want intubation.  Code Status: Partial code/DNR/want intubation  Admission status: Inpatient :The appropriate admission status for this patient is INPATIENT. Inpatient status is judged to be reasonable and necessary in order to provide the required intensity of service to ensure the patient's safety. The patient's presenting symptoms, physical exam findings, and initial radiographic and laboratory data in the context of their chronic comorbidities is felt to place them at high risk for further clinical deterioration. Furthermore, it is not anticipated that the patient will be medically stable for discharge from the hospital within 2 midnights of admission. The following factors support the admission status of inpatient.       Time spent in minutes : 60 min  [place]

## 2021-11-27 NOTE — ED Notes (Signed)
Juquan Reznick - his caretaker and Niece of his.  Her number is [phone]

## 2021-11-27 NOTE — ED Provider Notes (Signed)
[facility] EMERGENCY DEPARTMENT Provider Note   CSN: [id number] Arrival date & time: 11/27/21  1435     History {Add pertinent medical, surgical, social history, OB history to HPI:1} Chief Complaint  Patient presents with   Chest Pain    Allen Yu is a 76 y.o. male with past medical history significant for congestive heart failure, CKD, PTSD, schizophrenia, diabetes, left AKA, right chronic gangrene who presents with concern for chest pain, decreased oxygen saturation at home.  Patient is somewhat agitated when being asked questions, he will not describe his chest pain to me.  He is quite hard of hearing.  His wife denies any nausea, vomiting, diaphoresis.  He is not ambulatory at baseline, cannot elucidate exertional findings.   Chest Pain      Home Medications Prior to Admission medications   Medication Sig Start Date End Date Taking? Authorizing Provider  acetaminophen (TYLENOL) 500 MG tablet Take 500 mg by mouth 2 (two) times daily as needed for moderate pain. 03/07/21   [provider]  atorvastatin (LIPITOR) 40 MG tablet Take 1 tablet (40 mg total) by mouth at bedtime. 07/14/21 10/17/21  Strader, Fransisco Hertz, PA-C  clopidogrel (PLAVIX) 75 MG tablet Take 1 tablet (75 mg total) by mouth daily. 07/07/21 07/07/22  Roxan Hockey, MD  donepezil (ARICEPT) 10 MG tablet Take 10 mg by mouth at bedtime.    [provider]  feeding supplement, ENSURE COMPLETE, (ENSURE COMPLETE) LIQD Take 237 mLs by mouth 2 (two) times daily between meals. Patient taking differently: Take 237 mLs by mouth 2 (two) times daily as needed (Between meals). 01/19/21   Roxan Hockey, MD  ferrous sulfate 325 (65 FE) MG tablet Take 325 mg by mouth daily with breakfast. 03/07/21   [provider]  gabapentin (NEURONTIN) 100 MG capsule Take 1 capsule (100 mg total) by mouth 2 (two) times daily. 07/07/21   Roxan Hockey, MD  insulin glargine (LANTUS) 100 UNIT/ML injection Inject 18  Units into the skin daily.    [provider]  Metoprolol Tartrate 37.5 MG TABS Take 37.5 mg by mouth 3 (three) times daily. 09/15/21 10/17/21  Regalado, Jerald Kief A, MD  Multiple Vitamins-Minerals (MULTIVITAMIN WITH MINERALS) tablet Take 1 tablet by mouth daily. 01/19/21 01/19/22  Roxan Hockey, MD  omeprazole (PRILOSEC) 20 MG capsule Take 20 mg by mouth daily. 03/23/21   [provider]  oxyCODONE (OXY IR/ROXICODONE) 5 MG immediate release tablet Take 1 tablet (5 mg total) by mouth every 4 (four) hours as needed for moderate pain or severe pain. Patient not taking: Reported on 11/03/2021 10/24/21   Patrecia Pour, MD  risperiDONE (RISPERDAL) 0.5 MG tablet Take 1 tablet (0.5 mg total) by mouth every 12 (twelve) hours as needed (Restlessness and agitation). Patient taking differently: Take 0.5 mg by mouth at bedtime. 01/19/21   Roxan Hockey, MD      Allergies    Bee venom, Codeine, Propoxyphene, and Valsartan    Review of Systems   Review of Systems  Cardiovascular:  Positive for chest pain.  All other systems reviewed and are negative.   Physical Exam Updated Vital Signs BP (!) 154/112 (BP Location: Right Arm)   Pulse (!) 128   Temp (!) 97.5 F (36.4 C) (Oral)   Resp (!) 24   SpO2 97%  Physical Exam Vitals and nursing note reviewed.  Constitutional:      General: He is not in acute distress.    Appearance: Normal appearance. He is ill-appearing.  HENT:     Head: Normocephalic and atraumatic.  Eyes:     General:        Right eye: No discharge.        Left eye: No discharge.  Cardiovascular:     Rate and Rhythm: Normal rate and regular rhythm.     Heart sounds: No murmur heard.    No friction rub. No gallop.  Pulmonary:     Effort: Pulmonary effort is normal.     Breath sounds: Normal breath sounds.  Abdominal:     General: Bowel sounds are normal.     Palpations: Abdomen is soft.  Skin:    General: Skin is warm and dry.     Capillary Refill: Capillary  refill takes less than 2 seconds.     Comments: Left above-the-knee amputation is appropriately healed with no evidence of skin tears, right foot already partially amputated with evidence of dry gangrene, foul smell, multiple chronic wounds but appears to be bandaged appropriately, there is no significant tracking cellulitis extending up his leg.  Neurological:     Mental Status: He is alert and oriented to person, place, and time.  Psychiatric:        Mood and Affect: Mood normal.        Behavior: Behavior normal.     ED Results / Procedures / Treatments   Labs (all labs ordered are listed, but only abnormal results are displayed) Labs Reviewed  BASIC METABOLIC PANEL  CBC  HEPATIC FUNCTION PANEL  LACTIC ACID, PLASMA  LACTIC ACID, PLASMA  CBG MONITORING, ED  TROPONIN I (HIGH SENSITIVITY)    EKG None  Radiology No results found.  Procedures Procedures  {Document cardiac monitor, telemetry assessment procedure when appropriate:1}  Medications Ordered in ED Medications  sodium chloride 0.9 % bolus 1,000 mL (has no administration in time range)    ED Course/ Medical Decision Making/ A&P                           Medical Decision Making Amount and/or Complexity of Data Reviewed Labs: ordered. Radiology: ordered.   ***  {Document critical care time when appropriate:1} {Document review of labs and clinical decision tools ie heart score, Chads2Vasc2 etc:1}  {Document your independent review of radiology images, and any outside records:1} {Document your discussion with family members, caretakers, and with consultants:1} {Document social determinants of health affecting pt's care:1} {Document your decision making why or why not admission, treatments were needed:1} Final Clinical Impression(s) / ED Diagnoses Final diagnoses:  None    Rx / DC Orders ED Discharge Orders     None

## 2021-11-28 ENCOUNTER — Encounter (HOSPITAL_COMMUNITY): Payer: Self-pay | Admitting: Family Medicine

## 2021-11-28 DIAGNOSIS — E785 Hyperlipidemia, unspecified: Secondary | ICD-10-CM

## 2021-11-28 DIAGNOSIS — Z7189 Other specified counseling: Secondary | ICD-10-CM

## 2021-11-28 DIAGNOSIS — I5021 Acute systolic (congestive) heart failure: Secondary | ICD-10-CM

## 2021-11-28 DIAGNOSIS — I739 Peripheral vascular disease, unspecified: Secondary | ICD-10-CM

## 2021-11-28 DIAGNOSIS — I480 Paroxysmal atrial fibrillation: Secondary | ICD-10-CM

## 2021-11-28 DIAGNOSIS — R071 Chest pain on breathing: Secondary | ICD-10-CM | POA: Diagnosis not present

## 2021-11-28 DIAGNOSIS — Z515 Encounter for palliative care: Secondary | ICD-10-CM

## 2021-11-28 LAB — CBC
HCT: 24.1 % — ABNORMAL LOW (ref 39.0–52.0)
Hemoglobin: 7.9 g/dL — ABNORMAL LOW (ref 13.0–17.0)
MCH: 29.3 pg (ref 26.0–34.0)
MCHC: 32.8 g/dL (ref 30.0–36.0)
MCV: 89.3 fL (ref 80.0–100.0)
Platelets: 246 10*3/uL (ref 150–400)
RBC: 2.7 MIL/uL — ABNORMAL LOW (ref 4.22–5.81)
RDW: 16.3 % — ABNORMAL HIGH (ref 11.5–15.5)
WBC: 7.9 10*3/uL (ref 4.0–10.5)
nRBC: 0 % (ref 0.0–0.2)

## 2021-11-28 LAB — GLUCOSE, CAPILLARY
Glucose-Capillary: 62 mg/dL — ABNORMAL LOW (ref 70–99)
Glucose-Capillary: 69 mg/dL — ABNORMAL LOW (ref 70–99)
Glucose-Capillary: 103 mg/dL — ABNORMAL HIGH (ref 70–99)
Glucose-Capillary: 65 mg/dL — ABNORMAL LOW (ref 70–99)
Glucose-Capillary: 38 mg/dL — CL (ref 70–99)
Glucose-Capillary: 78 mg/dL (ref 70–99)

## 2021-11-28 LAB — TYPE AND SCREEN
ABO/RH(D): O POS
Antibody Screen: NEGATIVE
Unit division: 0

## 2021-11-28 LAB — COMPREHENSIVE METABOLIC PANEL
ALT: 48 U/L — ABNORMAL HIGH (ref 0–44)
AST: 51 U/L — ABNORMAL HIGH (ref 15–41)
Albumin: 2.4 g/dL — ABNORMAL LOW (ref 3.5–5.0)
Alkaline Phosphatase: 237 U/L — ABNORMAL HIGH (ref 38–126)
Anion gap: 13 (ref 5–15)
BUN: 40 mg/dL — ABNORMAL HIGH (ref 8–23)
CO2: 24 mmol/L (ref 22–32)
Calcium: 8.2 mg/dL — ABNORMAL LOW (ref 8.9–10.3)
Chloride: 102 mmol/L (ref 98–111)
Creatinine, Ser: 1.8 mg/dL — ABNORMAL HIGH (ref 0.61–1.24)
GFR, Estimated: 39 mL/min — ABNORMAL LOW (ref >60)
Glucose, Bld: 117 mg/dL — ABNORMAL HIGH (ref 70–99)
Potassium: 3 mmol/L — ABNORMAL LOW (ref 3.5–5.1)
Sodium: 139 mmol/L (ref 135–145)
Total Bilirubin: 1 mg/dL (ref 0.3–1.2)
Total Protein: 6.5 g/dL (ref 6.5–8.1)

## 2021-11-28 LAB — BPAM RBC
Blood Product Expiration Date: 202312052359
ISSUE DATE / TIME: 202311061835
Unit Type and Rh: 5100

## 2021-11-28 MED ORDER — SODIUM CHLORIDE 0.9 % IV SOLN: 250.0000 mL | INTRAVENOUS | Status: DC | PRN

## 2021-11-28 MED ORDER — INSULIN GLARGINE-YFGN 100 UNIT/ML ~~LOC~~ SOLN
18.0000 [IU] | Freq: Every day | SUBCUTANEOUS | Status: DC
  Administered 2021-11-28: 18 [IU] via SUBCUTANEOUS
  Filled 2021-11-28 (×2): qty 0.18

## 2021-11-28 MED ORDER — FUROSEMIDE 10 MG/ML IJ SOLN
40.0000 mg | Freq: Once | INTRAMUSCULAR | Status: AC
  Administered 2021-11-28: 40 mg via INTRAVENOUS
  Filled 2021-11-28: qty 4

## 2021-11-28 MED ORDER — INSULIN ASPART 100 UNIT/ML IJ SOLN: 0.0000 [IU] | Freq: Three times a day (TID) | INTRAMUSCULAR | Status: DC

## 2021-11-28 MED ORDER — FUROSEMIDE 10 MG/ML IJ SOLN
40.0000 mg | Freq: Two times a day (BID) | INTRAMUSCULAR | Status: DC
  Administered 2021-11-28 – 2021-11-29 (×3): 40 mg via INTRAVENOUS
  Filled 2021-11-28 (×3): qty 4

## 2021-11-28 MED ORDER — GLUCOSE 40 % PO GEL
ORAL | Status: AC
  Filled 2021-11-28: qty 1.21

## 2021-11-28 MED ORDER — GLUCOSE 40 % PO GEL
2.0000 | ORAL | Status: AC
  Administered 2021-11-28: 62 g via ORAL

## 2021-11-28 MED ORDER — ONDANSETRON HCL 4 MG/2ML IJ SOLN: 4.0000 mg | Freq: Four times a day (QID) | INTRAMUSCULAR | Status: DC | PRN

## 2021-11-28 MED ORDER — INSULIN ASPART 100 UNIT/ML IJ SOLN: 0.0000 [IU] | Freq: Every day | INTRAMUSCULAR | Status: DC

## 2021-11-28 MED ORDER — OXYCODONE HCL 5 MG PO TABS
5.0000 mg | ORAL_TABLET | ORAL | Status: DC | PRN
  Administered 2021-11-28 – 2021-11-30 (×3): 5 mg via ORAL
  Filled 2021-11-28 (×4): qty 1

## 2021-11-28 MED ORDER — CLOPIDOGREL BISULFATE 75 MG PO TABS
75.0000 mg | ORAL_TABLET | Freq: Every day | ORAL | Status: DC
  Administered 2021-11-28 – 2021-11-30 (×3): 75 mg via ORAL
  Filled 2021-11-28 (×3): qty 1

## 2021-11-28 MED ORDER — ACETAMINOPHEN 325 MG PO TABS: 650.0000 mg | ORAL_TABLET | Freq: Four times a day (QID) | ORAL | Status: DC | PRN

## 2021-11-28 MED ORDER — DEXTROSE 50 % IV SOLN
1.0000 | Freq: Once | INTRAVENOUS | Status: AC
  Administered 2021-11-28: 50 mL via INTRAVENOUS
  Filled 2021-11-28: qty 50

## 2021-11-28 MED ORDER — ACETAMINOPHEN 650 MG RE SUPP: 650.0000 mg | Freq: Four times a day (QID) | RECTAL | Status: DC | PRN

## 2021-11-28 MED ORDER — PANTOPRAZOLE SODIUM 40 MG PO TBEC
40.0000 mg | DELAYED_RELEASE_TABLET | Freq: Every day | ORAL | Status: DC
  Administered 2021-11-28 – 2021-11-30 (×3): 40 mg via ORAL
  Filled 2021-11-28 (×3): qty 1

## 2021-11-28 MED ORDER — SODIUM CHLORIDE 0.9% FLUSH
3.0000 mL | Freq: Two times a day (BID) | INTRAVENOUS | Status: DC
  Administered 2021-11-28 – 2021-11-30 (×5): 3 mL via INTRAVENOUS

## 2021-11-28 MED ORDER — DONEPEZIL HCL 5 MG PO TABS
10.0000 mg | ORAL_TABLET | Freq: Every day | ORAL | Status: DC
  Administered 2021-11-28 (×2): 10 mg via ORAL
  Filled 2021-11-28 (×3): qty 2

## 2021-11-28 MED ORDER — CHLORHEXIDINE GLUCONATE CLOTH 2 % EX PADS
6.0000 | MEDICATED_PAD | Freq: Every day | CUTANEOUS | Status: DC
  Administered 2021-11-28 – 2021-11-30 (×3): 6 via TOPICAL

## 2021-11-28 MED ORDER — SODIUM CHLORIDE 0.9% FLUSH: 3.0000 mL | INTRAVENOUS | Status: DC | PRN

## 2021-11-28 MED ORDER — METOPROLOL TARTRATE 25 MG PO TABS
37.5000 mg | ORAL_TABLET | Freq: Three times a day (TID) | ORAL | Status: DC
  Administered 2021-11-28 – 2021-11-29 (×4): 37.5 mg via ORAL
  Filled 2021-11-28 (×4): qty 2

## 2021-11-28 MED ORDER — POTASSIUM CHLORIDE CRYS ER 20 MEQ PO TBCR
40.0000 meq | EXTENDED_RELEASE_TABLET | Freq: Two times a day (BID) | ORAL | Status: AC
  Administered 2021-11-28 (×2): 40 meq via ORAL
  Filled 2021-11-28 (×2): qty 2

## 2021-11-28 MED ORDER — RISPERIDONE 0.5 MG PO TABS
0.5000 mg | ORAL_TABLET | Freq: Every day | ORAL | Status: DC
  Administered 2021-11-28 (×2): 0.5 mg via ORAL
  Filled 2021-11-28 (×3): qty 1

## 2021-11-28 MED ORDER — ENOXAPARIN SODIUM 40 MG/0.4ML IJ SOSY
40.0000 mg | PREFILLED_SYRINGE | INTRAMUSCULAR | Status: DC
  Administered 2021-11-28: 40 mg via SUBCUTANEOUS
  Filled 2021-11-28: qty 0.4

## 2021-11-28 MED ORDER — ONDANSETRON HCL 4 MG PO TABS: 4.0000 mg | ORAL_TABLET | Freq: Four times a day (QID) | ORAL | Status: DC | PRN

## 2021-11-28 MED ORDER — ENOXAPARIN SODIUM 30 MG/0.3ML IJ SOSY
30.0000 mg | PREFILLED_SYRINGE | INTRAMUSCULAR | Status: DC
  Administered 2021-11-29 – 2021-11-30 (×2): 30 mg via SUBCUTANEOUS
  Filled 2021-11-28 (×2): qty 0.3

## 2021-11-28 MED ORDER — INSULIN GLARGINE-YFGN 100 UNIT/ML ~~LOC~~ SOLN
9.0000 [IU] | Freq: Every day | SUBCUTANEOUS | Status: DC
  Filled 2021-11-28 (×2): qty 0.09

## 2021-11-28 NOTE — Progress Notes (Signed)
Inpatient Diabetes Program Recommendations  AACE/ADA: New Consensus Statement on Inpatient Glycemic Control   Target Ranges:  Prepandial:   less than 140 mg/dL      Peak postprandial:   less than 180 mg/dL (1-2 hours)      Critically ill patients:  140 - 180 mg/dL    Latest Reference Range & Units 11/27/21 15:59 11/28/21 07:31  Glucose-Capillary 70 - 99 mg/dL 194 (H) 62 (L)    Review of Glycemic Control  Diabetes history: DM2 Outpatient Diabetes medications: Lantus 18 units daily Current orders for Inpatient glycemic control: Semglee 18 units QHS, Novolog 0-9 units TID with meals, Novolog 0-5 units QHS  Inpatient Diabetes Program Recommendations:    Insulin: Please consider decreasing Semglee to 9 units QHS.  Thanks, Barnie Alderman, RN, MSN, [place] [facility] Coordinator Inpatient Diabetes Program [phone] (Team Pager from 8am to [place])

## 2021-11-28 NOTE — Progress Notes (Signed)
PROGRESS NOTE    Allen Yu  SHF:[id number] DOB: 13-Aug-1945 DOA: 11/27/2021 PCP: [name], MD   Brief Narrative:    Allen Yu  is a 76 y.o. male, with medical history of dementia, PVD s/p left AKA, urinary retention with chronic Foley catheter, diabetes mellitus type 2, atrial fibrillation, chronic dry right gangrene, recently seen by vascular surgery, and patient refused amputation and decided to continue with palliative care as outpatient, came to ED with complaints of chest pain and also decreased oxygen saturation at home.  He was admitted with acute diastolic and systolic CHF exacerbation and has been started on IV diuresis with Lasix.  Cardiology following.  Palliative also consulted as his long-term prognosis is quite poor with his ongoing right lower extremity dry gangrene.  He has temporarily been started on IV Rocephin and Flagyl for this with no growth on blood cultures.   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  Would not remove staples at this time as this would disrupt tissues even further at this point and increased risk of infection.  Transaminitis -Likely due to hepatic congestion in  the setting of heart failure -Continue to monitor -Holding Lipitor  Hypokalemia -Replete orally and monitor carefully with aggressive diuresis  Type 2 diabetes with hypoglycemia -Decrease long-acting insulin to 9 units at bedtime per diabetes coordinator recommendations   Dementia -Continue Aricept and Risperdal  Atrial fibrillation -Continue metoprolol 37.5 mg 3 times daily and anticipate switch to Toprol-XL by discharge per cardiology -Continue close monitoring on telemetry -No anticoagulation due to recent GI bleed  CKD stage IIIb -Creatinine stable and at baseline -Continue to monitor with aggressive diuresis   DVT prophylaxis: Lovenox Code Status: Partial code Family Communication: None at bedside Disposition Plan: Continue IV diuresis for heart failure and anticipate discharge once euvolemic hopefully with home hospice Status is: Inpatient Remains inpatient appropriate because: Need for IV medications.   Consultants:  Cardiology Palliative care  Procedures:  None  Antimicrobials:  Anti-infectives (From admission, onward)    Start     Dose/Rate Route Frequency Ordered Stop   11/27/21 1715  cefTRIAXone (ROCEPHIN) 2 g in sodium chloride 0.9 % 100 mL IVPB       See Hyperspace for full Linked Orders Report.   2 g 200 mL/hr over 30 Minutes Intravenous Every 24 hours 11/27/21 1703 12/04/21 1659   11/27/21 1715  metroNIDAZOLE (FLAGYL) IVPB 500 mg       See Hyperspace for full Linked Orders Report.   500 mg 100 mL/hr over 60 Minutes Intravenous Every 12 hours 11/27/21 1703 12/04/21 1659      Subjective: Patient seen and evaluated today and states that he continues to have some mild chest pain and is not straightforward with further history taking.  He is very hard of hearing.  No other acute events since admission noted.  Objective: Vitals:   11/27/21 2316 11/28/21 0015 11/28/21 0015 11/28/21 0458  BP: (!) 135/94  (!) 134/94 105/86  Pulse: (!) 122  (!) 122 (!)  123  Resp: '18  14 19  '$ Temp: 97.8 F (36.6 C)  97.7 F (36.5 C) 97.9 F (36.6 C)  TempSrc: Oral  Oral   SpO2: 95% 98% 98% 100%  Weight:   59.7 kg   Height:   '6\' 1"'$  (1.854 m)     Intake/Output Summary (Last 24 hours) at 11/28/2021 1039 Last data filed at 11/28/2021 1610 Gross per 24 hour  Intake 1889.5 ml  Output 600 ml  Net 1289.5 ml   Filed Weights   11/28/21 0015  Weight: 59.7 kg    Examination:  General exam: Appears calm and comfortable, very hard of hearing Respiratory system: Clear to auscultation. Respiratory effort normal. [place] Cardiovascular system: S1 & S2 heard, RRR.  Gastrointestinal system: Abdomen is soft Central nervous system: Alert and awake Extremities: Left AKA and right foot gangrene with staples present  Skin: No significant lesions noted Psychiatry: Flat affect.    Data Reviewed: I have personally reviewed following labs and imaging studies  CBC: Recent Labs  Lab 11/27/21 1448 11/28/21 0450  WBC 9.0 7.9  HGB 6.9* 7.9*  HCT 21.6* 24.1*  MCV 91.5 89.3  PLT 284 960   Basic Metabolic Panel: Recent Labs  Lab 11/27/21 1448 11/28/21 0450  NA 137 139  K 3.8 3.0*  CL 103 102  CO2 20* 24  GLUCOSE 183* 117*  BUN 40* 40*  CREATININE 1.92* 1.80*  CALCIUM 8.2* 8.2*   GFR: Estimated Creatinine Clearance: 29.9 mL/min (A) (by C-G formula based on SCr of 1.8 mg/dL (H)). Liver Function Tests: Recent Labs  Lab 11/27/21 1707 11/28/21 0450  AST 43* 51*  ALT 45* 48*  ALKPHOS 225* 237*  BILITOT 1.5* 1.0  PROT 6.7 6.5  ALBUMIN 2.5* 2.4*   No results for input(s): "LIPASE", "AMYLASE" in the last 168 hours. No results for input(s): "AMMONIA" in the last 168 hours. Coagulation Profile: No results for input(s): "INR", "PROTIME" in the last 168 hours. Cardiac Enzymes: No results for input(s): "CKTOTAL", "CKMB", "CKMBINDEX", "TROPONINI" in the last 168 hours. BNP (last 3 results) No results for input(s): "PROBNP" in the last 8760  hours. HbA1C: No results for input(s): "HGBA1C" in the last 72 hours. CBG: Recent Labs  Lab 11/27/21 1559 11/28/21 0731  GLUCAP 194* 62*   Lipid Profile: No results for input(s): "CHOL", "HDL", "LDLCALC", "TRIG", "CHOLHDL", "LDLDIRECT" in the last 72 hours. Thyroid Function Tests: No results for input(s): "TSH", "T4TOTAL", "FREET4", "T3FREE", "THYROIDAB" in the last 72 hours. Anemia Panel: No results for input(s): "VITAMINB12", "FOLATE", "FERRITIN", "TIBC", "IRON", "RETICCTPCT" in the last 72 hours. Sepsis Labs: Recent Labs  Lab 11/27/21 1707 11/27/21 2304  LATICACIDVEN 2.4* 1.4    Recent Results (from the past 240 hour(s))  Blood culture (routine x 2)     Status: None (Preliminary result)   Collection Time: 11/27/21  5:07 PM   Specimen: BLOOD  Result Value Ref Range Status   Specimen Description BLOOD BLOOD RIGHT WRIST  Final   Special Requests   Final    BOTTLES DRAWN AEROBIC AND ANAEROBIC Blood Culture adequate volume   Culture   Final    NO GROWTH < 24 HOURS Performed at [facility], [address] [place], [place] [address]  Report Status PENDING  Incomplete  Blood culture (routine x 2)     Status: None (Preliminary result)   Collection Time: 11/27/21  5:07 PM   Specimen: BLOOD  Result Value Ref Range Status   Specimen Description BLOOD BLOOD RIGHT WRIST  Final   Special Requests   Final    BOTTLES DRAWN AEROBIC AND ANAEROBIC Blood Culture adequate volume   Culture   Final    NO GROWTH < 24 HOURS Performed at [facility], [address] [name], [place] [address]    Report Status PENDING  Incomplete         Radiology Studies: CT Angio Chest PE W and/or Wo Contrast  Result Date: 11/27/2021 CLINICAL DATA:  Shortness of breath chest pain. Pulmonary embolism suspected, high probability. EXAM: CT ANGIOGRAPHY CHEST WITH CONTRAST TECHNIQUE: Multidetector CT imaging of the chest was performed using the standard protocol during bolus administration of  intravenous contrast. Multiplanar CT image reconstructions and MIPs were obtained to evaluate the vascular anatomy. RADIATION DOSE REDUCTION: This exam was performed according to the departmental dose-optimization program which includes automated exposure control, adjustment of the mA and/or kV according to patient size and/or use of iterative reconstruction technique. CONTRAST:  8m OMNIPAQUE IOHEXOL 350 MG/ML SOLN COMPARISON:  Chest CTA 06/29/2020.  Chest radiograph 11/27/2021 FINDINGS: Cardiovascular: Negative for pulmonary embolism. Enlargement of the right heart with contrast refluxing into the hepatic veins. Coronary artery calcifications. Normal caliber thoracic aorta with atherosclerotic calcifications. No significant pericardial effusion. Mediastinum/Nodes: No significant mediastinal or hilar lymph node enlargement. No axillary lymph node enlargement. Lungs/Pleura: Large bilateral pleural effusions. Centrilobular emphysema. Severe volume loss and consolidation in left lower lobe. Indeterminate 4 mm nodule in the left lower lobe on image 71/6. Compressive atelectasis in the posterior left upper lobe. Patient had a suspicious pulmonary nodule in the right upper lobe on the prior CT but this area is poorly characterized due to severe volume loss throughout the right lung. Nonspecific anterior pleural-based density in the right lung and image 60/6. Significant volume loss in the right lower lobe with compressive atelectasis from the large pleural effusion. Upper Abdomen: Low-density structures in left kidney upper pole probably represent cysts but poorly characterized. Small calcifications in the kidneys could be vascular in etiology. Evidence for cholecystectomy. Musculoskeletal: Anterior plate and screw fixation in lower cervical spine. No acute bone abnormality. Review of the MIP images confirms the above findings. IMPRESSION: 1. Negative for pulmonary embolism. 2. Large bilateral pleural effusions with  severe compressive atelectasis in the lower lobes. 3. Enlargement of the right heart with contrast refluxing into the hepatic veins. Findings are suggestive for right heart dysfunction. 4. Patient had a suspicious pulmonary nodule in the right upper lobe on the prior CT but this area is poorly characterized due to the severe volume loss. There is also an indeterminate 4 mm pulmonary nodule in the left lower lobe. Recommend follow-up chest CT after resolution of the pleural effusions. 5. Coronary artery calcifications. 6. Aortic Atherosclerosis (ICD10-I70.0) and Emphysema (ICD10-J43.9). Electronically Signed   By: AMarkus DaftM.D.   On: 11/27/2021 17:40   DG Chest 1 View  Result Date: 11/27/2021 CLINICAL DATA:  Shortness of breath EXAM: CHEST  1 VIEW COMPARISON:  Chest x-ray dated September 06, 2021 FINDINGS: Cardiac and mediastinal contours are within normal limits for AP technique. Small bilateral pleural effusions and bibasilar atelectasis. No evidence of pneumothorax. IMPRESSION: Small bilateral pleural effusions and bibasilar atelectasis. Electronically Signed   By: LHosie PoissonD.  On: 11/27/2021 16:08        Scheduled Meds:  Chlorhexidine Gluconate Cloth  6 each Topical Daily   clopidogrel  75 mg Oral Daily   donepezil  10 mg Oral QHS   [START ON 11/29/2021] enoxaparin (LOVENOX) injection  30 mg Subcutaneous Q24H   furosemide  40 mg Intravenous BID   insulin aspart  0-5 Units Subcutaneous QHS   insulin aspart  0-9 Units Subcutaneous TID WC   insulin glargine-yfgn  9 Units Subcutaneous QHS   metoprolol tartrate  37.5 mg Oral Q8H   pantoprazole  40 mg Oral Daily   potassium chloride  40 mEq Oral BID   risperiDONE  0.5 mg Oral QHS   sodium chloride flush  3 mL Intravenous Q12H   Continuous Infusions:  sodium chloride     cefTRIAXone (ROCEPHIN)  IV Stopped (11/27/21 1830)   And   metronidazole Stopped (11/28/21 0559)     LOS: 1 day    Time spent: 35 minutes    Brysin Towery D  Manuella Ghazi, DO [facility]  If 7PM-7AM, please contact night-coverage [email or web address] 11/28/2021, 10:39 AM

## 2021-11-28 NOTE — Progress Notes (Signed)
At 2310, patient refused blood glucose to be rechecked post 1 ampule of dextrose 50 administration stating " God damn, I need to sleep!" And covering himself up with blanket. Informed Dr. Orlin Hilding of refusal.

## 2021-11-28 NOTE — Progress Notes (Signed)
Pt has multiple staples present on right foot amputation site as well as what appears to be a graft site on right shin.  Skin is healed, however dried and flaky, around sites.  Graft site skin is dry, hard, flaky in color.  Pt unable to give this nurse the date of surgery.  Provider notified that patient still has staples present in both the amputation and possible graft site.

## 2021-11-28 NOTE — Consult Note (Signed)
Consultation Note Date: 11/28/2021   Patient Name: Allen Yu  DOB: 1945/04/05  MRN: [id number]  Age / Sex: 76 y.o., male  PCP: Allen Noble, MD Referring Physician: Rodena Goldmann, DO  Reason for Consultation: Establishing goals of care  HPI/Patient Profile: 76 y.o. 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   Clinical Assessment and Goals of Care: I have reviewed medical records including EPIC notes, labs and imaging, received report from RN, assessed the patient.  Allen Yu is lying quietly in bed.  He greets me, making and somewhat keeping eye contact.  He appears acutely/chronically ill and quite frail.  He has known dementia therefore I do not ask orientation questions.  I am not sure that he can make his basic needs known.  There is no family at bedside at this time.  Call to wife Allen Yu to discuss diagnosis prognosis, [place], EOL wishes, disposition and options.  I introduced Palliative Medicine as specialized medical care for people living with serious illness. It focuses on providing relief from the symptoms and stress of a serious illness. The goal is to improve quality of life for both the patient and the family.  We discussed a brief life review of the patient.  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  She shares that he was career [employer].  We then focused on their current illness.  Allen Yu shares that she understands Allen Yu is not producing hemoglobin because of fighting infection.  She asks what antibiotic could help him.  We talk about what we can and cannot change for Allen Yu.  We talk about treating the treatable, symptom  management.  We talk about cardiology recommendations for no invasive treatments, medical management only.  We talk about calling hospice for symptom management, do not rehospitalize.  The natural disease trajectory and expectations at EOL were discussed.  Advanced directives, concepts specific to code status, artifical feeding and hydration, and rehospitalization were considered and discussed.  Allen Yu states that Allen Yu is DNR, she has a form at home.  She tells me that the ED doctor asked what he wanted, and he wanted to be "brought back".  I shared that it is "attempted" resuscitation.  She states understanding.  We talk about Allen Yu memory loss.  I encouraged her that, at this point, it is not appropriate to ask him these hard questions/decision.  Hospice and Palliative Care services outpatient were explained and offered.  Allen Yu states that she spoke with hospice of [facility] this morning and they are going to come into the home and work with family.  Unfortunately, report from coworker shared that she did indeed speak with hospice this morning but told them that that family was not interested in service.     Discussed the importance of continued conversation  with family and the medical providers regarding overall plan of care and treatment options, ensuring decisions are within the context of the patient's values and GOCs. Questions and concerns were addressed.  The family was encouraged to call with questions or concerns.  PMT will continue to support holistically.  Conference with attending, bedside nursing staff, transition of care team related to patient condition, needs, goals of care, disposition.   HCPOA NEXT OF KIN -wife, Allen Yu    SUMMARY OF RECOMMENDATIONS   Treat the treatable but no CPR or intubation Home with the benefits of hospice care Considering do not rehospitalize   Code Status/Advance Care Planning: DNR -Allen Yu states that Allen Yu  is DNR, she has a form at home.  She tells me that the ED doctor asked what he wanted, and he wanted to be "brought back".  I shared that it is "attempted" resuscitation.  She states understanding.   We talk about Allen Yu's memory loss.  I encouraged her that, at this point, it is not appropriate to ask him these hard questions/decisions.   Symptom Management:  Per hospitalist, no additional needs at this time.  Palliative Prophylaxis:  Frequent Pain Assessment, Oral Care, and Palliative Wound Care  Additional Recommendations (Limitations, Scope, Preferences): At this point optimize for discharge.  No CPR or intubation.  Home with hospice care.  Considering do not rehospitalize.  Psycho-social/Spiritual:  Desire for further Chaplaincy support:no Additional Recommendations: Caregiving  Support/Resources and Education on Hospice  Prognosis:  < 3 months, would not be surprising based on chronic illness burden, poor functional status, 5 hospital visits in the last 6 months.  Discharge Planning: Anticipate home with hospice care      Primary Diagnoses: Present on Admission:  Chest pain   I have reviewed the medical record, interviewed the patient and family, and examined the patient. The following aspects are pertinent.  Past Medical History:  Diagnosis Date   Anemia    Arthritis    Carotid stenosis    Chronic back pain    Chronic HFrEF (heart failure with reduced ejection fraction) ([place])    a. 09/2020 Echo: EF 45%, mild LVH; b. 12/2020 Echo: EF 25-30%, glob HK, mod LVH, mildly reduced RV fxn, RVSP 65.26mHg, mod BAE. Triv effusion. Mod MR/TR.   CKD (chronic kidney disease), stage III ([place]    [place]    [facility]    Diabetes mellitus    Dilated cardiomyopathy [place]    a. 09/2020 Echo: EF 45%; b. 12/2020 Echo: EF 25-30%.   Foot drop, right    GERD (gastroesophageal reflux disease)    History of kidney stones    Hypertension    Lung nodule    PAF (paroxysmal  atrial fibrillation) (H[facility])    Peripheral neuropathy    Peripheral vascular disease ([facility]    a. 06/2020 s/p L AKA; b. 10/2020 s/p R SFA and above-knee popliteal PTA.   PTSD (post-traumatic stress disorder)    Schizophrenia ([place]    Tuberculosis    Treated   Social History   Socioeconomic History   Marital status: Married    Spouse name: SIvin Yu  Number of children: Not on file   Years of education: Not on file   Highest education level: Not on file  Occupational History   Not on file  Tobacco Use   Smoking status: Every Day    Packs/day: 0.50    Years: 45.00    Total pack years: 22.50    Types: Cigarettes  Smokeless tobacco: Never   Tobacco comments:    burns them up  Vaping Use   Vaping Use: Never used  Substance and Sexual Activity   Alcohol use: No    Alcohol/week: 0.0 standard drinks of alcohol   Drug use: Yes    Types: Marijuana    Comment: almost daily for pain   Sexual activity: Never  Other Topics Concern   Not on file  Social History Narrative   Service connected veteran    Cared for at home by wife Allen Yu   In home with twin (boy, girl) grandchildren of Allen Yu   Social Determinants of Health   Financial Resource Strain: Low Risk  (08/16/2021)   Overall Financial Resource Strain (CARDIA)    Difficulty of Paying Living Expenses: Not hard at all  Food Insecurity: No Food Insecurity (09/05/2021)   Hunger Vital Sign    Worried About Running Out of Food in the Last Year: Never true    Ran Out of Food in the Last Year: Never true  Transportation Needs: No Transportation Needs (09/05/2021)   PRAPARE - Hydrologist (Medical): No    Lack of Transportation (Non-Medical): No  Physical Activity: Not on file  Stress: Stress Concern Present (09/13/2021)   [place]    Feeling of Stress : To some extent  Social Connections: Socially Integrated (08/28/2021)   Social  Connection and Isolation Panel [NHANES]    Frequency of Communication with Friends and Family: Three times a week    Frequency of Social Gatherings with Friends and Family: Three times a week    Attends Religious Services: 1 to 4 times per year    Active Member of Clubs or Organizations: Yes    Attends Archivist Meetings: 1 to 4 times per year    Marital Status: Married   Family History  Problem Relation Age of Onset   Heart disease Mother        before age 47   Scheduled Meds:  Chlorhexidine Gluconate Cloth  6 each Topical Daily   clopidogrel  75 mg Oral Daily   donepezil  10 mg Oral QHS   [START ON 11/29/2021] enoxaparin (LOVENOX) injection  30 mg Subcutaneous Q24H   furosemide  40 mg Intravenous BID   insulin aspart  0-5 Units Subcutaneous QHS   insulin aspart  0-9 Units Subcutaneous TID WC   insulin glargine-yfgn  9 Units Subcutaneous QHS   metoprolol tartrate  37.5 mg Oral Q8H   pantoprazole  40 mg Oral Daily   potassium chloride  40 mEq Oral BID   risperiDONE  0.5 mg Oral QHS   sodium chloride flush  3 mL Intravenous Q12H   Continuous Infusions:  sodium chloride     cefTRIAXone (ROCEPHIN)  IV Stopped (11/27/21 1830)   And   metronidazole Stopped (11/28/21 0559)   PRN Meds:.sodium chloride, acetaminophen **OR** acetaminophen, ondansetron **OR** ondansetron (ZOFRAN) IV, oxyCODONE, sodium chloride flush Medications Prior to Admission:  Prior to Admission medications   Medication Sig Start Date End Date Taking? Authorizing Provider  acetaminophen (TYLENOL) 500 MG tablet Take 500 mg by mouth 2 (two) times daily as needed for moderate pain. 03/07/21  Yes [provider]  atorvastatin (LIPITOR) 40 MG tablet Take 1 tablet (40 mg total) by mouth at bedtime. 07/14/21 11/28/21 Yes Strader, Fransisco Hertz, PA-C  Cholecalciferol (D3-1000) 25 MCG (1000 UT) tablet Take 1,000 Units by mouth daily.  Yes [provider]  clopidogrel (PLAVIX) 75 MG tablet Take 1  tablet (75 mg total) by mouth daily. 07/07/21 07/07/22 Yes Emokpae, Courage, MD  donepezil (ARICEPT) 10 MG tablet Take 10 mg by mouth at bedtime.   Yes [provider]  ferrous sulfate 325 (65 FE) MG tablet Take 325 mg by mouth daily with breakfast. 03/07/21  Yes [provider]  gabapentin (NEURONTIN) 100 MG capsule Take 1 capsule (100 mg total) by mouth 2 (two) times daily. 07/07/21  Yes Emokpae, Courage, MD  insulin glargine (LANTUS) 100 UNIT/ML injection Inject 18 Units into the skin daily.   Yes [provider]  Metoprolol Tartrate 37.5 MG TABS Take 37.5 mg by mouth 3 (three) times daily. 09/15/21 11/28/21 Yes Regalado, Belkys A, MD  Multiple Vitamins-Minerals (MULTIVITAMIN WITH MINERALS) tablet Take 1 tablet by mouth daily. 01/19/21 01/19/22 Yes Emokpae, Courage, MD  risperiDONE (RISPERDAL) 0.5 MG tablet Take 1 tablet (0.5 mg total) by mouth every 12 (twelve) hours as needed (Restlessness and agitation). Patient taking differently: Take 0.5 mg by mouth at bedtime. 01/19/21  Yes Emokpae, Courage, MD  traMADol (ULTRAM) 50 MG tablet Take 50 mg by mouth every 6 (six) hours as needed for moderate pain.   Yes [provider]  feeding supplement, ENSURE COMPLETE, (ENSURE COMPLETE) LIQD Take 237 mLs by mouth 2 (two) times daily between meals. Patient taking differently: Take 237 mLs by mouth 2 (two) times daily as needed (Between meals). 01/19/21   Roxan Hockey, MD   Allergies  Allergen Reactions   Bee Venom Anaphylaxis   Codeine Other (See Comments)    incoherent  Other reaction(s): Delirium    Propoxyphene Other (See Comments)    Dizziness, "Makes me feel drunk" Other reaction(s): Dizziness    Valsartan Other (See Comments)    incoherent Other reaction(s): Delirium    Review of Systems  Unable to perform ROS: Dementia    Physical Exam Vitals and nursing note reviewed.  Pulmonary:     Effort: Pulmonary effort is normal.  Neurological:     Mental  Status: He is alert.     Comments: Known dementia  Psychiatric:     Comments: Calm and cooperative, not fearful     Vital Signs: BP 105/86   Pulse (!) 123   Temp 97.9 F (36.6 C)   Resp 19   Ht '6\' 1"'$  (1.854 m)   Wt 59.7 kg   SpO2 100%   BMI 17.36 kg/m  Pain Scale: 0-10   Pain Score: 0-No pain   SpO2: SpO2: 100 % O2 Device:SpO2: 100 % O2 Flow Rate: .O2 Flow Rate (L/min): 2 L/min  IO: Intake/output summary:  Intake/Output Summary (Last 24 hours) at 11/28/2021 1237 Last data filed at 11/28/2021 1121 Gross per 24 hour  Intake 2249.5 ml  Output 600 ml  Net 1649.5 ml    LBM: Last BM Date : 11/28/21 Baseline Weight: Weight: 59.7 kg Most recent weight: Weight: 59.7 kg     Palliative Assessment/Data:   Flowsheet Rows    Flowsheet Row Most Recent Value  Intake Tab   Referral Department Hospitalist  Unit at Time of Referral Cardiac/Telemetry Unit  Palliative Care Primary Diagnosis Cardiac  Date Notified 11/28/21  Palliative Care Type Return patient Palliative Care  Reason for referral Clarify Goals of Care  Date of Admission 11/27/21  Date first seen by Palliative Care 11/28/21  # of days Palliative referral response time 0 Day(s)  # of days IP prior to Palliative referral  1  Clinical Assessment   Palliative Performance Scale Score 30%  Pain Max last 24 hours Not able to report  Pain Min Last 24 hours Not able to report  Dyspnea Max Last 24 Hours Not able to report  Dyspnea Min Last 24 hours Not able to report  Psychosocial & Spiritual Assessment   Palliative Care Outcomes        Time In: 1040 Time Out: 1155 Time Total: 75 minutes  Greater than 50%  of this time was spent counseling and coordinating care related to the above assessment and plan.  Signed by: Drue Novel, NP   Please contact Palliative Medicine Team phone at [phone] for questions and concerns.  For individual provider: See Shea Evans

## 2021-11-28 NOTE — Consult Note (Signed)
Cardiology Consultation   Patient ID: Allen Yu MRN: [id number]; DOB: 1945/08/21  Admit date: 11/27/2021 Date of Consult: 11/28/2021  PCP:  [name], [place] [facility] Cardiologist:  Rozann Lesches, MD        Patient Profile:   Allen Yu is a 76 y.o. male with a hx of PAD (s/p L AKA in 06/2020), persistent atrial fibrillation (diagnosed in 07/2020 and converted to NSR with IV Amiodarone, recurrence in 09/2020), HFrEF (EF 45% in 09/2020, at 25-30% in 12/2020, improved to 35-40% in 06/2021), HTN, Type 2 DM, PTSD, Schizophrenia and dementia who is being seen 11/28/2021 for the evaluation of CHF at the request of Dr. Manuella Ghazi.  History of Present Illness:   Allen Yu was examined by myself in 06/2021 following a recent admission for a CHF exacerbation. His weight was estimated at 110 lbs as he was unable to stand to obtain an accurate weight. He did report worsening dyspnea on exertion and a cough for the past several days, therefore was recommend increase Torsemide to 60 mg daily for the next several days and if no improvement, would arrange for a repeat CXR. He was continued on Losartan 50 mg daily and Toprol-XL 50 mg daily as BP had previously not allowed for switching to [facility] and he was not on an SGLT2 inhibitor given his malnutrition. He did have significant discoloration along his right toes concerning for gangrenous changes, therefore he was referred back to Vascular Surgery urgently. Angiogram was recommended but canceled due to an AKI and anemia. He did ultimately undergo balloon angioplasty and stenting of the superficial femoral artery, popliteal artery and angioplasty of the peroneal artery on 07/26/2021. 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  In 08/2021 he was admitted to [facility] for sepsis in the setting of gangrene of his right lower extremity and Cardiology was consulted as he was found to be  in 2:1 atrial flutter during admission.  He was not felt to be a candidate for DOAC's given his high bleeding risk and therefore was not a candidate for antiarrhythmic medications. Rate control was recommended with the expectation that his cardiomyopathy would likely worsen over time. Palliative care was consulted for goals of care discussion and he was ultimately discharged home with Hospice.  He did have an office visit with Ambrose Pancoast, NP in 09/2021 for preoperative cardiac clearance for consideration of amputation. Denied any cardiac complaints and was cleared to proceed. Was continued on Plavix 75 mg daily (on this per Vascular) and Lopressor 37.5 mg TID. He again had a recurrent admission in 10/2021 as he initially presented for planned right AKA but this was canceled due to hypoglycemia. He declined interventions overnight and declined further treatments.  He was discharged home with resumption of Hospice Services with plans to not undergo amputation and pursue comfort measures. He also met with Vascular Surgery on 11/03/2021 and at that time he wished to defer amputation and continue with Palliative Services.  He presented to [facility] ED on 11/27/2021 for evaluation of chest pain and hypoxia as his oxygen saturations were in the 70's on RA. Initial labs showed WBC 9.0, Hgb 6.9 (previously 6.7 in 10/2021 and 9.1 in 08/2021), platelets 284, Na+ 137, K+ 3.8 and creatinine 1.92 (close to baseline). AST 43, ALT 45 and alk phos 225. Lactic Acid 2.4 with repeat of 1.4. Initial and repeat Hs Troponin values flat at 21 and 22. BNP 4086.  CXR showed small bilateral pleural effusions and bibasilar atelectasis. CTA negative for PE but showed large bilateral pleural effusions with severe compressive atelectasis and enlargement of the right heart with contrast refluxing into the hepatic veins suggestive of right heart dysfunction. Also noted to have pulmonary nodules with follow-up CT recommended following resolution  of pleural effusions. EKG appears most consistent with atrial flutter with RVR, heart rate 128 with PVCs.  He initially received a 1L fluid bolus due to his LA level and was later started on IV Lasix with 18m x1. Repeat labs this AM show creatinine has improved to 1.80 with K+ low at 3.0 (supplementation already ordered). Received 1 unit pRBC's and Hgb at 7.9 today.  In talking with the patient today, he is very hard of hearing but says that his chest pain did resolve overnight. Says he did not sleep for 3 days prior to admission due to leg pain but was able to sleep some last night. Still having some shortness of breath which is mostly orthopnea. Unable to assess dyspnea on exertion as he is not active at baseline. No current chest pain or palpitations. No family currently at the bedside.    Past Medical History:  Diagnosis Date   Anemia    Arthritis    Carotid stenosis    Chronic back pain    Chronic HFrEF (heart failure with reduced ejection fraction) ([facility]    a. 09/2020 Echo: EF 45%, mild LVH; b. 12/2020 Echo: EF 25-30%, glob HK, mod LVH, mildly reduced RV fxn, RVSP 65.710mg, mod BAE. Triv effusion. Mod MR/TR.   CKD (chronic kidney disease), stage III ([facility] [place]   Constipation    Dementia ([facility]   Diabetes mellitus    Dilated cardiomyopathy ([facility]   a. 09/2020 Echo: EF 45%; b. 12/2020 Echo: EF 25-30%.   Foot drop, right    GERD (gastroesophageal reflux disease)    History of kidney stones    Hypertension    Lung nodule    PAF (paroxysmal atrial fibrillation) (HCC)    Peripheral neuropathy    Peripheral vascular disease ([facility]   a. 06/2020 s/p L AKA; b. 10/2020 s/p R SFA and above-knee popliteal PTA.   PTSD (post-traumatic stress disorder)    Schizophrenia ([facility]CD[facility]   Tuberculosis    Treated    Past Surgical History:  Procedure Laterality Date   ABDOMINAL AORTOGRAM W/LOWER EXTREMITY N/A 05/20/2020   Procedure: ABDOMINAL AORTOGRAM W/LOWER EXTREMITY;  Surgeon: FiElam DutchMD;   [facility];  Service: Cardiovascular;  Laterality: N/A;   ABDOMINAL AORTOGRAM W/LOWER EXTREMITY N/A 10/27/2020   Procedure: ABDOMINAL AORTOGRAM W/LOWER EXTREMITY;  Surgeon: ClMarty HeckMD;  [facility];  Service: Cardiovascular;  Laterality: N/A;   ABDOMINAL AORTOGRAM W/LOWER EXTREMITY Right 07/26/2021   Procedure: ABDOMINAL AORTOGRAM W/LOWER EXTREMITY;  Surgeon: RoBroadus JohnMD;  [facility];  Service: Cardiovascular;  Laterality: Right;   AMPUTATION Left 06/28/2020   Procedure: AMPUTATION ABOVE KNEE LEFT;  Surgeon: EaRosetta PosnerMD;  [place] [facility] [place] Service: Vascular;  Laterality: Left;   APIonia2000, 2013   x2   BLADDER SURGERY     02   CATARACT EXTRACTION W/PHACO Right 05/12/2021   Procedure: CATARACT EXTRACTION PHACO AND INTRAOCULAR LENS PLACEMENT ([place]  Surgeon: WrBaruch GoldmannMD;  Location: [facility];  Service: Ophthalmology;  Laterality: Right;  CDE: 27.28   CATARACT EXTRACTION W/PHACO  Left 05/29/2021   Procedure: CATARACT EXTRACTION PHACO AND INTRAOCULAR LENS PLACEMENT (IOC);  Surgeon: Baruch Goldmann, MD;  Location: [facility];  Service: Ophthalmology;  Laterality: Left;  CDE  12.96   CHOLECYSTECTOMY     COLONOSCOPY     COLONOSCOPY N/A 12/07/2014   Procedure: COLONOSCOPY;  Surgeon: Daneil Dolin, MD;  Location: [facility];  Service: Endoscopy;  Laterality: N/A;  10:30 Am   ENDOVASCULAR REPAIR/STENT GRAFT Right 07/26/2021   Procedure: ENDOVASCULAR REPAIR/STENT GRAFT;  Surgeon: Broadus Jaxtyn, MD;  Location: [place] CV LAB;  Service: Cardiovascular;  Laterality: Right;  SFA   EYE SURGERY Bilateral    removed metal from eye   FEMORAL-TIBIAL BYPASS GRAFT Left 05/27/2020   Procedure: LEFT FEMORAL TO PERONEAL ARTERY BYPASS;  Surgeon: Rosetta Posner, MD;  Location: [place];  Service: Vascular;  Laterality: Left;   HERNIA REPAIR Right    INGUINAL HERNIA REPAIR Left 11/03/2013   Procedure: HERNIA REPAIR  INGUINAL ADULT;  Surgeon: Gayland Curry, MD;  Location: [place];  Service: General;  Laterality: Left;   IRRIGATION AND DEBRIDEMENT FOOT Right 08/17/2021   Procedure: IRRIGATION AND DEBRIDEMENT ULCER RIGHT LEG APPLICATION SKIN SUBSTITUTE;  Surgeon: Criselda Peaches, DPM;  Location: [facility];  Service: Podiatry;  Laterality: Right;   PERIPHERAL VASCULAR BALLOON ANGIOPLASTY Right 07/26/2021   Procedure: PERIPHERAL VASCULAR BALLOON ANGIOPLASTY;  Surgeon: Broadus Kennie, MD;  Location: [place] CV LAB;  Service: Cardiovascular;  Laterality: Right;  PERONEAL   PERIPHERAL VASCULAR INTERVENTION  10/27/2020   Procedure: PERIPHERAL VASCULAR INTERVENTION;  Surgeon: Marty Heck, MD;  Location: [place] CV LAB;  Service: Cardiovascular;;   SHOULDER SURGERY     RIGHT SHOULDER    TRANSMETATARSAL AMPUTATION Right 08/17/2021   Procedure: TRANSMETATARSAL AMPUTATION;  Surgeon: Criselda Peaches, DPM;  Location: [facility];  Service: Podiatry;  Laterality: Right;   VIDEO BRONCHOSCOPY WITH ENDOBRONCHIAL NAVIGATION N/A 07/10/2019   Procedure: VIDEO BRONCHOSCOPY WITH ENDOBRONCHIAL NAVIGATION;  Surgeon: Melrose Nakayama, MD;  Location: [place];  Service: Thoracic;  Laterality: N/A;     Home Medications:  Prior to Admission medications   Medication Sig Start Date End Date Taking? Authorizing Provider  acetaminophen (TYLENOL) 500 MG tablet Take 500 mg by mouth 2 (two) times daily as needed for moderate pain. 03/07/21  Yes [provider]  atorvastatin (LIPITOR) 40 MG tablet Take 1 tablet (40 mg total) by mouth at bedtime. 07/14/21 11/28/21 Yes , Fransisco Hertz, PA-C  Cholecalciferol (D3-1000) 25 MCG (1000 UT) tablet Take 1,000 Units by mouth daily.   Yes [provider]  clopidogrel (PLAVIX) 75 MG tablet Take 1 tablet (75 mg total) by mouth daily. 07/07/21 07/07/22 Yes Emokpae, Courage, MD  donepezil (ARICEPT) 10 MG tablet Take 10 mg by mouth at bedtime.   Yes [provider]  ferrous  sulfate 325 (65 FE) MG tablet Take 325 mg by mouth daily with breakfast. 03/07/21  Yes [provider]  gabapentin (NEURONTIN) 100 MG capsule Take 1 capsule (100 mg total) by mouth 2 (two) times daily. 07/07/21  Yes Emokpae, Courage, MD  insulin glargine (LANTUS) 100 UNIT/ML injection Inject 18 Units into the skin daily.   Yes [provider]  Metoprolol Tartrate 37.5 MG TABS Take 37.5 mg by mouth 3 (three) times daily. 09/15/21 11/28/21 Yes Regalado, Belkys A, MD  Multiple Vitamins-Minerals (MULTIVITAMIN WITH MINERALS) tablet Take 1 tablet by mouth daily. 01/19/21 01/19/22 Yes Emokpae, Courage, MD  risperiDONE (RISPERDAL) 0.5 MG tablet  Take 1 tablet (0.5 mg total) by mouth every 12 (twelve) hours as needed (Restlessness and agitation). Patient taking differently: Take 0.5 mg by mouth at bedtime. 01/19/21  Yes Emokpae, Courage, MD  traMADol (ULTRAM) 50 MG tablet Take 50 mg by mouth every 6 (six) hours as needed for moderate pain.   Yes [provider]  feeding supplement, ENSURE COMPLETE, (ENSURE COMPLETE) LIQD Take 237 mLs by mouth 2 (two) times daily between meals. Patient taking differently: Take 237 mLs by mouth 2 (two) times daily as needed (Between meals). 01/19/21   Roxan Hockey, MD    Inpatient Medications: Scheduled Meds:  Chlorhexidine Gluconate Cloth  6 each Topical Daily   clopidogrel  75 mg Oral Daily   donepezil  10 mg Oral QHS   [START ON 11/29/2021] enoxaparin (LOVENOX) injection  30 mg Subcutaneous Q24H   furosemide  40 mg Intravenous BID   insulin aspart  0-5 Units Subcutaneous QHS   insulin aspart  0-9 Units Subcutaneous TID WC   insulin glargine-yfgn  18 Units Subcutaneous QHS   metoprolol tartrate  37.5 mg Oral Q8H   pantoprazole  40 mg Oral Daily   potassium chloride  40 mEq Oral BID   risperiDONE  0.5 mg Oral QHS   sodium chloride flush  3 mL Intravenous Q12H   Continuous Infusions:  sodium chloride     cefTRIAXone (ROCEPHIN)  IV Stopped  (11/27/21 1830)   And   metronidazole Stopped (11/28/21 0559)   PRN Meds: sodium chloride, acetaminophen **OR** acetaminophen, ondansetron **OR** ondansetron (ZOFRAN) IV, oxyCODONE, sodium chloride flush  Allergies:    Allergies  Allergen Reactions   Bee Venom Anaphylaxis   Codeine Other (See Comments)    incoherent  Other reaction(s): Delirium    Propoxyphene Other (See Comments)    Dizziness, "Makes me feel drunk" Other reaction(s): Dizziness    Valsartan Other (See Comments)    incoherent Other reaction(s): Delirium     Social History:   Social History   Socioeconomic History   Marital status: Married    Spouse name: [name]   Number of children: Not on file   Years of education: Not on file   Highest education level: Not on file  Occupational History   Not on file  Tobacco Use   Smoking status: Every Day    Packs/day: 0.50    Years: 45.00    Total pack years: 22.50    Types: Cigarettes   Smokeless tobacco: Never   Tobacco comments:    burns them up  Vaping Use   Vaping Use: Never used  Substance and Sexual Activity   Alcohol use: No    Alcohol/week: 0.0 standard drinks of alcohol   Drug use: Yes    Types: Marijuana    Comment: almost daily for pain   Sexual activity: Never  Other Topics Concern   Not on file  Social History Narrative   Service connected veteran    Cared for at home by [name]   In home with twin (boy, girl) grandchildren of [name]   Social Determinants of Health   Financial Resource Strain: Low Risk  (08/16/2021)   Overall Financial Resource Strain (CARDIA)    Difficulty of Paying Living Expenses: Not hard at all  Food Insecurity: No Food Insecurity (09/05/2021)   Hunger Vital Sign    Worried About Running Out of Food in the Last Year: Never true    Ran Out of Food in the Last Year: Never true  Transportation  Needs: No Transportation Needs (09/05/2021)   PRAPARE - Hydrologist (Medical): No     Lack of Transportation (Non-Medical): No  Physical Activity: Not on file  Stress: Stress Concern Present (09/13/2021)   [place]    Feeling of Stress : To some extent  Social Connections: Socially Integrated (08/28/2021)   Social Connection and Isolation Panel [NHANES]    Frequency of Communication with Friends and Family: Three times a week    Frequency of Social Gatherings with Friends and Family: Three times a week    Attends Religious Services: 1 to 4 times per year    Active Member of Clubs or Organizations: Yes    Attends Archivist Meetings: 1 to 4 times per year    Marital Status: Married  Human resources officer Violence: Not At Risk (08/28/2021)   Humiliation, Afraid, Rape, and Kick questionnaire    Fear of Current or Ex-Partner: No    Emotionally Abused: No    Physically Abused: No    Sexually Abused: No    Family History:    Family History  Problem Relation Age of Onset   Heart disease Mother        before age 39     ROS:  Please see the history of present illness.   All other ROS reviewed and negative.     Physical Exam/Data:   Vitals:   11/27/21 2316 11/28/21 0015 11/28/21 0015 11/28/21 0458  BP: (!) 135/94  (!) 134/94 105/86  Pulse: (!) 122  (!) 122 (!) 123  Resp: _0 Temp: 97.8 F (36.6 C)  97.7 F (36.5 C) 97.9 F (36.6 C)  TempSrc: Oral  Oral   SpO2: 95% 98% 98% 100%  Weight:   59.7 kg   Height:   6' 1" (1.854 m)     Intake/Output Summary (Last 24 hours) at 11/28/2021 0900 Last data filed at 11/28/2021 6606 Gross per 24 hour  Intake 1889.5 ml  Output 600 ml  Net 1289.5 ml      11/28/2021   12:15 AM 11/03/2021    9:09 AM 10/23/2021    6:20 AM  Last 3 Weights  Weight (lbs) 131 lb 9.8 oz 103 lb 103 lb 9.9 oz  Weight (kg) 59.7 kg 46.72 kg 47 kg     Body mass index is 17.36 kg/m.  General: Thin elderly male appearing in no acute distress. Hard of hearing.  HEENT:  normal Neck: JVD to jaw-line.  Vascular: No carotid bruits; Distal pulses 2+ bilaterally Cardiac:  normal S1, S2; Irregularly irregular.  Lungs: rales along bases bilaterally.  Abd: soft, nontender, no hepatomegaly  Ext: Left AKA. Gangrenous changes along right foot with staples in place along recent wound. Pitting edema also noted.  Skin: warm and dry  Neuro:  CNs 2-12 intact, no focal abnormalities noted Psych:  Normal affect   EKG:  The EKG was personally reviewed and demonstrates: Atrial flutter with RVR, heart rate 128 with PVCs. Telemetry:  Telemetry was personally reviewed and demonstrates: Narrow-complex tachycardia, appearing most consistent with atrial flutter, HR in 110's to 120's.   Relevant CV Studies:  Echocardiogram: 07/04/2021 IMPRESSIONS     1. Left ventricular ejection fraction, by estimation, is 35 to 40%. The  left ventricle has moderately decreased function. The left ventricle  demonstrates global hypokinesis. Left ventricular diastolic parameters are  consistent with Grade I diastolic  dysfunction (impaired relaxation).  2. Right ventricular systolic function is normal. The right ventricular  size is normal. There is moderately elevated pulmonary artery systolic  pressure. The estimated right ventricular systolic pressure is 16.1 mmHg.   3. The mitral valve is grossly normal. Trivial mitral valve  regurgitation. No evidence of mitral stenosis.   4. The aortic valve is tricuspid. There is mild calcification of the  aortic valve. Aortic valve regurgitation is not visualized. Aortic valve  sclerosis is present, with no evidence of aortic valve stenosis.   5. The inferior vena cava is dilated in size with >50% respiratory  variability, suggesting right atrial pressure of 8 mmHg.   Comparison(s): Changes from prior study are noted. EF has improved  ~35-40%.   Laboratory Data:  High Sensitivity Troponin:   Recent Labs  Lab 11/27/21 1448 11/27/21 1707   TROPONINIHS 21* 22*     Chemistry Recent Labs  Lab 11/27/21 1448 11/28/21 0450  NA 137 139  K 3.8 3.0*  CL 103 102  CO2 20* 24  GLUCOSE 183* 117*  BUN 40* 40*  CREATININE 1.92* 1.80*  CALCIUM 8.2* 8.2*  GFRNONAA 36* 39*  ANIONGAP 14 13    Recent Labs  Lab 11/27/21 1707 11/28/21 0450  PROT 6.7 6.5  ALBUMIN 2.5* 2.4*  AST 43* 51*  ALT 45* 48*  ALKPHOS 225* 237*  BILITOT 1.5* 1.0   Lipids No results for input(s): "CHOL", "TRIG", "HDL", "LABVLDL", "LDLCALC", "CHOLHDL" in the last 168 hours.  Hematology Recent Labs  Lab 11/27/21 1448 11/28/21 0450  WBC 9.0 7.9  RBC 2.36* 2.70*  HGB 6.9* 7.9*  HCT 21.6* 24.1*  MCV 91.5 89.3  MCH 29.2 29.3  MCHC 31.9 32.8  RDW 16.8* 16.3*  PLT 284 246   Thyroid No results for input(s): "TSH", "FREET4" in the last 168 hours.  BNP Recent Labs  Lab 11/27/21 1717  BNP 4,086.0*    DDimer No results for input(s): "DDIMER" in the last 168 hours.   Radiology/Studies:  CT Angio Chest PE W and/or Wo Contrast  Result Date: 11/27/2021 CLINICAL DATA:  Shortness of breath chest pain. Pulmonary embolism suspected, high probability. EXAM: CT ANGIOGRAPHY CHEST WITH CONTRAST TECHNIQUE: Multidetector CT imaging of the chest was performed using the standard protocol during bolus administration of intravenous contrast. Multiplanar CT image reconstructions and MIPs were obtained to evaluate the vascular anatomy. RADIATION DOSE REDUCTION: This exam was performed according to the departmental dose-optimization program which includes automated exposure control, adjustment of the mA and/or kV according to patient size and/or use of iterative reconstruction technique. CONTRAST:  69m OMNIPAQUE IOHEXOL 350 MG/ML SOLN COMPARISON:  Chest CTA 06/29/2020.  Chest radiograph 11/27/2021 FINDINGS: Cardiovascular: Negative for pulmonary embolism. Enlargement of the right heart with contrast refluxing into the hepatic veins. Coronary artery calcifications. Normal  caliber thoracic aorta with atherosclerotic calcifications. No significant pericardial effusion. Mediastinum/Nodes: No significant mediastinal or hilar lymph node enlargement. No axillary lymph node enlargement. Lungs/Pleura: Large bilateral pleural effusions. Centrilobular emphysema. Severe volume loss and consolidation in left lower lobe. Indeterminate 4 mm nodule in the left lower lobe on image 71/6. Compressive atelectasis in the posterior left upper lobe. Patient had a suspicious pulmonary nodule in the right upper lobe on the prior CT but this area is poorly characterized due to severe volume loss throughout the right lung. Nonspecific anterior pleural-based density in the right lung and image 60/6. Significant volume loss in the right lower lobe with compressive atelectasis from the large pleural effusion. Upper Abdomen: Low-density structures  in left kidney upper pole probably represent cysts but poorly characterized. Small calcifications in the kidneys could be vascular in etiology. Evidence for cholecystectomy. Musculoskeletal: Anterior plate and screw fixation in lower cervical spine. No acute bone abnormality. Review of the MIP images confirms the above findings. IMPRESSION: 1. Negative for pulmonary embolism. 2. Large bilateral pleural effusions with severe compressive atelectasis in the lower lobes. 3. Enlargement of the right heart with contrast refluxing into the hepatic veins. Findings are suggestive for right heart dysfunction. 4. Patient had a suspicious pulmonary nodule in the right upper lobe on the prior CT but this area is poorly characterized due to the severe volume loss. There is also an indeterminate 4 mm pulmonary nodule in the left lower lobe. Recommend follow-up chest CT after resolution of the pleural effusions. 5. Coronary artery calcifications. 6. Aortic Atherosclerosis (ICD10-I70.0) and Emphysema (ICD10-J43.9). Electronically Signed   By: Markus Daft M.D.   On: 11/27/2021 17:40    DG Chest 1 View  Result Date: 11/27/2021 CLINICAL DATA:  Shortness of breath EXAM: CHEST  1 VIEW COMPARISON:  Chest x-ray dated September 06, 2021 FINDINGS: Cardiac and mediastinal contours are within normal limits for AP technique. Small bilateral pleural effusions and bibasilar atelectasis. No evidence of pneumothorax. IMPRESSION: Small bilateral pleural effusions and bibasilar atelectasis. Electronically Signed   By: Yetta Glassman M.D.   On: 11/27/2021 16:08     Assessment and Plan:   1. Acute HFrEF - Presented with chest pain and worsening dyspnea in the setting of acute CHF exacerbation and progressive anemia. BNP elevated to 4086 on admission and CTA showed large bilateral effusions as outlined above.  - His EF was previously as low as 25-30% in 12/2020 and medical management was pursued given his comorbidities. Echo in 06/2021 showed his EF was at 35-40% but suspect this has worsened in the setting of his atrial flutter with RVR. Can consider a repeat limited echo for reassessment of EF but this would not change his management strategy at this time.  - He received IV Lasix 92m x1 and given his volume overload, would start IV Lasix 440mBID. Follow I&O's along with daily weights (was not on diuretic therapy prior to admission as Torsemide 4047maily was stopped during his admission in 08/2021 and never restarted). He will need a standing diuretic dose at discharge. Repeat BMET in AM.  - Continue Lopressor 37.5mg68mD for rate-control and can switch to Toprol-XL prior to discharge. Not a candidate for ACE-I/ARB/ARNI/MRA given his soft BP and his renal function has also been significantly variable over the past few months. Not a candidate for an SGLT2 inhibitor given his non-healing wounds and malnutrition.  - Appears he was being followed by Palliative Care as an outpatient and Palliative Medicine has been consulted this admission. Given his recurrent gangrene/non-healing wounds, CHF, atrial  flutter with RVR and anemia/GIB, this seems most appropriate as he is not a candidate for aggressive cardiac interventions.    2. Persistent Atrial Fibrillation/Flutter - He has a history of prior atrial fibrillation but was found to have more persistent atrial flutter during his admission in 08/2021. Rate-control was pursued at that time given he was not a candidate for anticoagulation given his recent GIB and anemia. While his CHA2DS2-VASc Score is at least 5, he is still not a candidate for anticoagulation given his anemia and requiring transfusions.  - Continue Lopressor 37.5mg 61m and can titrate as BP allows. Can switch to Toprol-XL prior to discharge given his  cardiomyopathy.   3. HTN - His BP was initially at 159/96, at 105/86 on most recent check which is close to his baseline. Continue Lopressor as outlined above.   4. HLD - He was on Atorvastatin 11m daily at home. Currently held given his elevated LFT's.   5. PAD - He is s/p L AKA and has been followed closely by Vascular Surgery and Podiatry as an outpatient given gangrene of his right foot with transmetatarsal amputation in 07/2021. By review of Vascular Surgery notes from 11/03/2021, the patient and his family wished to defer amputation and continue with Palliative Services.   6. Anemia - He has a history of GIB and was followed by GI during admission in 08/2021 but was uncooperative with plans for even a CT that admission and conservative management was pursued.  - His Hgb was at 6.9 on admission and he received 1 unit pRBC's and improved to 7.9 today. Further management per the admitting team.   7. Elevated LFT's - LFT's were elevated at the time of admission with AST 43, ALT 45 and alk phos 225. Suspect this is secondary to hepatic congestion as he denies any recent abdominal pain. Continue to follow with diuresis.    For questions or updates, please contact [place] consult [email or web address] for contact  info under    Signed, BErma Heritage PA-C  11/28/2021 9:00 AM

## 2021-11-28 NOTE — TOC Initial Note (Signed)
Transition of Care [facility]) - Initial/Assessment Note    Patient Details  Name: Allen Yu MRN: [id number] Date of Birth: [birth year]  [facility] CM/SW Contact:    Iona Beard, [place] Phone Number: 11/28/2021, 2:16 PM  Clinical Narrative:                 Pt is high risk for readmission. CSW updated that pt and family would like pt to return home with hospice of [facility]. CSW spoke with pts wife who is agreeable to hospice at home services. CSW explained that hospice will reach out to get DME ordered. CSW spoke to [facility] with [facility] who states she will reach out to pts wife and get DME ordered. TOC to follow.   Expected Discharge Plan: Home w Hospice Care Barriers to Discharge: Continued Medical Work up   Patient Goals and CMS Choice Patient states their goals for this hospitalization and ongoing recovery are:: home with hospice CMS Medicare.gov Compare Post Acute Care list provided to:: Patient Represenative (must comment) Choice offered to / list presented to : Patient, Spouse  Expected Discharge Plan and Services Expected Discharge Plan: Home w Hospice Care In-house Referral: Clinical Social Work Discharge Planning Services: CM Consult Post Acute Care Choice: Hospice Living arrangements for the past 2 months: Single Family Home                                      Prior Living Arrangements/Services Living arrangements for the past 2 months: Single Family Home Lives with:: Spouse Patient language and need for interpreter reviewed:: Yes Do you feel safe going back to the place where you live?: Yes      Need for Family Participation in Patient Care: Yes (Comment) Care giver support system in place?: Yes (comment) Current home services: DME Criminal Activity/Legal Involvement Pertinent to Current Situation/Hospitalization: No - Comment as needed  Activities of Daily Living   ADL Screening (condition at time of  admission) Patient's cognitive ability adequate to safely complete daily activities?: No Is the patient deaf or have difficulty hearing?: Yes Does the patient have difficulty concentrating, remembering, or making decisions?: Yes Patient able to express need for assistance with ADLs?: Yes Independently performs ADLs?: No Communication: Independent Grooming: Needs assistance Is this a change from baseline?: Pre-admission baseline Bathing: Needs assistance Is this a change from baseline?: Pre-admission baseline Toileting: Needs assistance Is this a change from baseline?: Pre-admission baseline In/Out Bed: Dependent Does the patient have difficulty walking or climbing stairs?: Yes Weakness of Legs: Right Weakness of Arms/Hands: None  Permission Sought/Granted                  Emotional Assessment Appearance:: Appears stated age       Alcohol / Substance Use: Not Applicable Psych Involvement: No (comment)  Admission diagnosis:  Chest pain [R07.9] Patient Active Problem List   Diagnosis Date Noted   Chest pain 11/27/2021   Hospice care patient 10/24/2021   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 ([place]) 10/23/2021   UTI (urinary tract infection) due to urinary indwelling Foley catheter ([place]) 10/23/2021   Atypical atrial flutter (HCC)    Gastrointestinal hemorrhage    Melena    Abnormal CT scan, gastrointestinal tract    Pressure injury of skin 09/08/2021   Sepsis ([place]) 09/06/2021   Ulcer of right lower extremity with bone involvement without  evidence of necrosis ([place])    AKI (acute kidney injury) ([place]) 07/21/2021   Complete traumatic amputation at level between unspecified hip and knee, initial encounter ([place]) 07/17/2021   Need for assistance with personal care 07/17/2021   Persons encountering health services in other specified circumstances 07/17/2021   Type 2 diabetes mellitus with hyperglycemia ([place]) 07/17/2021   Hypokalemia 04/13/2021   Colitis  04/12/2021   Bacteremia 04/11/2021   Pneumonia 04/11/2021   Chronic combined systolic (congestive) and diastolic (congestive) heart failure ([place]) 04/11/2021   Acute respiratory failure with hypoxia and hypercarbia (HCC) 01/23/2021   CKD (chronic kidney disease) stage 3, GFR 30-59 ml/min (HCC) 01/23/2021   Acute on chronic systolic CHF (congestive heart failure) ([place]) 01/23/2021   Acute respiratory failure with hypoxia ([place]) 01/13/2021   Encounter for fitting and adjustment of hearing aid 12/21/2020   Other specified problems related to psychosocial circumstances 11/21/2020   Sensorineural hearing loss, bilateral 11/21/2020   Tobacco dependence 10/06/2020   Benign prostatic hyperplasia with urinary obstruction 10/06/2020   Above knee amputation of left lower extremity ([place]) 10/06/2020   Other specified counseling 10/06/2020   Severe sepsis ([place]) 09/24/2020   Schizophrenia, unspecified ([place]) 09/24/2020   Unspecified dementia, unspecified severity, without behavioral disturbance, psychotic disturbance, mood disturbance, and anxiety ([place]) 09/24/2020   Lung nodule 09/24/2020   Indwelling Foley catheter present 09/24/2020   Peripheral vascular disease ([place]) 09/24/2020   Mass of testicle 09/24/2020   Problem related to unspecified psychosocial circumstances 08/01/2020   Hypermetropia 07/22/2020   Unspecified atrial fibrillation ([place]) 07/22/2020   Klebsiella sepsis ([place]) 07/22/2020   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   SIRS (systemic inflammatory response syndrome) ([place]) 17/00/1749   Acute metabolic encephalopathy 44/96/7591   Hyponatremia    S/P AKA (above knee amputation) unilateral, left ([place]) 06/28/2020   Gangrene of right foot ([place]) 06/28/2020   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 ([place]) 05/27/2020   Critical lower limb ischemia ([place]) 05/21/2020   Malnutrition ([place]) 05/19/2020   Heart failure, unspecified ([place]) 01/23/2020   Plantar callus 12/24/2019   Cavitary  pneumonia 09/14/2019   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   Unintentional weight loss 09/14/2019   Coronary artery calcification seen on CT scan 10/28/2018   Hilar adenopathy 04/10/2016   Lung nodule seen on imaging study 07/09/2015   Acute encephalopathy    Hypoglycemia due to insulin    Type 2 diabetes mellitus with other circulatory complications ([place]) 63/84/6659   Acute kidney injury superimposed on chronic kidney disease ([place]) 07/08/2015   Tobacco use disorder 07/08/2015   Diverticulosis of colon without hemorrhage    Left inguinal hernia 09/16/2013   Altered mental status 11/17/2012   Hypothermia 11/17/2012   Leukocytosis 11/17/2012   Chronic kidney disease, stage 3 unspecified ([place])    Post-traumatic stress disorder, chronic    GERD (gastroesophageal reflux disease)    Essential (primary) hypertension 09/10/2011   Anemia 09/10/2011   Diabetic neuropathy ([place]) 09/10/2011   DDD (degenerative disc disease), lumbar 09/10/2011   Psychosis ([place]) 09/10/2011   Diabetes mellitus type 2, uncontrolled 09/10/2011   Hyperlipemia 09/10/2011   Microalbuminuria 09/10/2011   PCP:  [name], MD Pharmacy:   [facility] - [address], [address] [place] [address] Phone: [phone] Fax: [place], [address] [place]  [place] [address] Phone: [phone] Fax: [place], [place] - [address] [place] [address] Phone: [phone] Fax: [phone]     Social Determinants of Health (SDOH) Interventions    Readmission Risk Interventions    11/28/2021    2:15 PM 10/24/2021   11:22 AM 09/15/2021   12:14 PM  Readmission Risk Prevention Plan  Transportation Screening Complete Complete Complete  Medication  Review ([place]) Complete  Complete  PCP or Specialist appointment within 3-5 days of discharge  Not Complete Not Complete  PCP/Specialist Appt Not Complete comments  Pt going home w/ Hospice Pt going home with Hospice  HRI or [place] Complete Complete Complete  SW Recovery Care/Counseling Consult Complete Complete Complete  Palliative Care Screening Complete Complete Complete  [place] Not Applicable Not Applicable Not Applicable

## 2021-11-29 DIAGNOSIS — R071 Chest pain on breathing: Secondary | ICD-10-CM | POA: Diagnosis not present

## 2021-11-29 LAB — COMPREHENSIVE METABOLIC PANEL
ALT: 47 U/L — ABNORMAL HIGH (ref 0–44)
AST: 48 U/L — ABNORMAL HIGH (ref 15–41)
Albumin: 2.1 g/dL — ABNORMAL LOW (ref 3.5–5.0)
Alkaline Phosphatase: 209 U/L — ABNORMAL HIGH (ref 38–126)
Anion gap: 9 (ref 5–15)
BUN: 45 mg/dL — ABNORMAL HIGH (ref 8–23)
CO2: 24 mmol/L (ref 22–32)
Calcium: 7.4 mg/dL — ABNORMAL LOW (ref 8.9–10.3)
Chloride: 103 mmol/L (ref 98–111)
Creatinine, Ser: 1.94 mg/dL — ABNORMAL HIGH (ref 0.61–1.24)
GFR, Estimated: 35 mL/min — ABNORMAL LOW (ref >60)
Glucose, Bld: 92 mg/dL (ref 70–99)
Potassium: 3.6 mmol/L (ref 3.5–5.1)
Sodium: 136 mmol/L (ref 135–145)
Total Bilirubin: 0.6 mg/dL (ref 0.3–1.2)
Total Protein: 5.8 g/dL — ABNORMAL LOW (ref 6.5–8.1)

## 2021-11-29 LAB — CBC
HCT: 23.6 % — ABNORMAL LOW (ref 39.0–52.0)
Hemoglobin: 7.7 g/dL — ABNORMAL LOW (ref 13.0–17.0)
MCH: 30 pg (ref 26.0–34.0)
MCHC: 32.6 g/dL (ref 30.0–36.0)
MCV: 91.8 fL (ref 80.0–100.0)
Platelets: 203 10*3/uL (ref 150–400)
RBC: 2.57 MIL/uL — ABNORMAL LOW (ref 4.22–5.81)
RDW: 16.8 % — ABNORMAL HIGH (ref 11.5–15.5)
WBC: 7 10*3/uL (ref 4.0–10.5)
nRBC: 0 % (ref 0.0–0.2)

## 2021-11-29 LAB — MAGNESIUM: Magnesium: 1.3 mg/dL — ABNORMAL LOW (ref 1.7–2.4)

## 2021-11-29 LAB — GLUCOSE, CAPILLARY
Glucose-Capillary: 98 mg/dL (ref 70–99)
Glucose-Capillary: 106 mg/dL — ABNORMAL HIGH (ref 70–99)

## 2021-11-29 MED ORDER — MAGNESIUM SULFATE 4 GM/100ML IV SOLN
4.0000 g | Freq: Once | INTRAVENOUS | Status: AC
  Administered 2021-11-29: 4 g via INTRAVENOUS
  Filled 2021-11-29: qty 100

## 2021-11-29 MED ORDER — FUROSEMIDE 40 MG PO TABS
40.0000 mg | ORAL_TABLET | Freq: Every day | ORAL | Status: DC
  Administered 2021-11-30: 40 mg via ORAL
  Filled 2021-11-29: qty 1

## 2021-11-29 MED ORDER — INSULIN GLARGINE-YFGN 100 UNIT/ML ~~LOC~~ SOLN
5.0000 [IU] | Freq: Every day | SUBCUTANEOUS | Status: DC
  Filled 2021-11-29: qty 0.05

## 2021-11-29 MED ORDER — METOPROLOL TARTRATE 50 MG PO TABS
50.0000 mg | ORAL_TABLET | Freq: Three times a day (TID) | ORAL | Status: DC
  Administered 2021-11-29: 50 mg via ORAL
  Filled 2021-11-29 (×3): qty 1

## 2021-11-29 NOTE — Progress Notes (Signed)
Attempted Reds Vest on pt with low quality reading x2. Pt refused any additional attempts.

## 2021-11-29 NOTE — Progress Notes (Signed)
Rounding Note    Patient Name: Allen Yu Date of Encounter: 11/29/2021  [place] Cardiologist: Rozann Lesches, MD   Subjective   Patient hard of hearing but denies any chest pain. Still short of breath. Wants to go home. Niece at the bedside and patient's wife joined by phone as she is currently sick.   Inpatient Medications    Scheduled Meds:  Chlorhexidine Gluconate Cloth  6 each Topical Daily   clopidogrel  75 mg Oral Daily   dextrose       donepezil  10 mg Oral QHS   enoxaparin (LOVENOX) injection  30 mg Subcutaneous Q24H   furosemide  40 mg Intravenous BID   insulin aspart  0-5 Units Subcutaneous QHS   insulin aspart  0-9 Units Subcutaneous TID WC   insulin glargine-yfgn  5 Units Subcutaneous QHS   metoprolol tartrate  37.5 mg Oral Q8H   pantoprazole  40 mg Oral Daily   risperiDONE  0.5 mg Oral QHS   sodium chloride flush  3 mL Intravenous Q12H   Continuous Infusions:  sodium chloride     cefTRIAXone (ROCEPHIN)  IV 2 g (11/28/21 1836)   And   metronidazole 500 mg (11/29/21 0505)   magnesium sulfate bolus IVPB     PRN Meds: sodium chloride, acetaminophen **OR** acetaminophen, dextrose, ondansetron **OR** ondansetron (ZOFRAN) IV, oxyCODONE, sodium chloride flush   Vital Signs    Vitals:   11/28/21 0458 11/28/21 1354 11/28/21 2110 11/29/21 0447  BP: 105/86 114/83 115/75 105/76  Pulse: (!) 123 (!) 121 99 (!) 116  Resp: _0 Temp: 97.9 F (36.6 C) 98.4 F (36.9 C) (!) 97.5 F (36.4 C) 98.9 F (37.2 C)  TempSrc:  Oral    SpO2: 100% 100% 98% 99%  Weight:      Height:        Intake/Output Summary (Last 24 hours) at 11/29/2021 0836 Last data filed at 11/29/2021 0500 Gross per 24 hour  Intake 1200 ml  Output 500 ml  Net 700 ml      11/28/2021   12:15 AM 11/03/2021    9:09 AM 10/23/2021    6:20 AM  Last 3 Weights  Weight (lbs) 131 lb 9.8 oz 103 lb 103 lb 9.9 oz  Weight (kg) 59.7 kg 46.72 kg 47 kg      Telemetry     Narrow-complex tachycardia appearing most consistent with atrial flutter with RVR, HR consistently at 119 - 120 bpm. - Personally Reviewed  ECG    No new tracings.   Physical Exam   GEN: Thin, chronically-ill appearing male.  Neck: JVD elevated to jaw.  Cardiac: RRR, no murmurs, rubs, or gallops.  Respiratory: Rales still present along bases bilaterally.  GI: Soft, nontender, non-distended  MS: Left AKA and staples in place along right leg. Pitting edema noted.  Neuro:  Nonfocal  Psych: Normal affect   Labs    High Sensitivity Troponin:   Recent Labs  Lab 11/27/21 1448 11/27/21 1707  TROPONINIHS 21* 22*     Chemistry Recent Labs  Lab 11/27/21 1448 11/27/21 1707 11/28/21 0450 11/29/21 0441  NA 137  --  139 136  K 3.8  --  3.0* 3.6  CL 103  --  102 103  CO2 20*  --  24 24  GLUCOSE 183*  --  117* 92  BUN 40*  --  40* 45*  CREATININE 1.92*  --  1.80* 1.94*  CALCIUM 8.2*  --  8.2* 7.4*  MG  --   --   --  1.3*  PROT  --  6.7 6.5 5.8*  ALBUMIN  --  2.5* 2.4* 2.1*  AST  --  43* 51* 48*  ALT  --  45* 48* 47*  ALKPHOS  --  225* 237* 209*  BILITOT  --  1.5* 1.0 0.6  GFRNONAA 36*  --  39* 35*  ANIONGAP 14  --  13 9    Lipids No results for input(s): "CHOL", "TRIG", "HDL", "LABVLDL", "LDLCALC", "CHOLHDL" in the last 168 hours.  Hematology Recent Labs  Lab 11/27/21 1448 11/28/21 0450 11/29/21 0441  WBC 9.0 7.9 7.0  RBC 2.36* 2.70* 2.57*  HGB 6.9* 7.9* 7.7*  HCT 21.6* 24.1* 23.6*  MCV 91.5 89.3 91.8  MCH 29.2 29.3 30.0  MCHC 31.9 32.8 32.6  RDW 16.8* 16.3* 16.8*  PLT 284 246 203   Thyroid No results for input(s): "TSH", "FREET4" in the last 168 hours.  BNP Recent Labs  Lab 11/27/21 1717  BNP 4,086.0*    DDimer No results for input(s): "DDIMER" in the last 168 hours.   Radiology    CT Angio Chest PE W and/or Wo Contrast  Result Date: 11/27/2021 CLINICAL DATA:  Shortness of breath chest pain. Pulmonary embolism suspected, high probability. EXAM: CT  ANGIOGRAPHY CHEST WITH CONTRAST TECHNIQUE: Multidetector CT imaging of the chest was performed using the standard protocol during bolus administration of intravenous contrast. Multiplanar CT image reconstructions and MIPs were obtained to evaluate the vascular anatomy. RADIATION DOSE REDUCTION: This exam was performed according to the departmental dose-optimization program which includes automated exposure control, adjustment of the mA and/or kV according to patient size and/or use of iterative reconstruction technique. CONTRAST:  59m OMNIPAQUE IOHEXOL 350 MG/ML SOLN COMPARISON:  Chest CTA 06/29/2020.  Chest radiograph 11/27/2021 FINDINGS: Cardiovascular: Negative for pulmonary embolism. Enlargement of the right heart with contrast refluxing into the hepatic veins. Coronary artery calcifications. Normal caliber thoracic aorta with atherosclerotic calcifications. No significant pericardial effusion. Mediastinum/Nodes: No significant mediastinal or hilar lymph node enlargement. No axillary lymph node enlargement. Lungs/Pleura: Large bilateral pleural effusions. Centrilobular emphysema. Severe volume loss and consolidation in left lower lobe. Indeterminate 4 mm nodule in the left lower lobe on image 71/6. Compressive atelectasis in the posterior left upper lobe. Patient had a suspicious pulmonary nodule in the right upper lobe on the prior CT but this area is poorly characterized due to severe volume loss throughout the right lung. Nonspecific anterior pleural-based density in the right lung and image 60/6. Significant volume loss in the right lower lobe with compressive atelectasis from the large pleural effusion. Upper Abdomen: Low-density structures in left kidney upper pole probably represent cysts but poorly characterized. Small calcifications in the kidneys could be vascular in etiology. Evidence for cholecystectomy. Musculoskeletal: Anterior plate and screw fixation in lower cervical spine. No acute bone  abnormality. Review of the MIP images confirms the above findings. IMPRESSION: 1. Negative for pulmonary embolism. 2. Large bilateral pleural effusions with severe compressive atelectasis in the lower lobes. 3. Enlargement of the right heart with contrast refluxing into the hepatic veins. Findings are suggestive for right heart dysfunction. 4. Patient had a suspicious pulmonary nodule in the right upper lobe on the prior CT but this area is poorly characterized due to the severe volume loss. There is also an indeterminate 4 mm pulmonary nodule in the left lower lobe. Recommend follow-up chest CT after resolution of the pleural effusions. 5. Coronary artery calcifications.  6. Aortic Atherosclerosis (ICD10-I70.0) and Emphysema (ICD10-J43.9). Electronically Signed   By: Markus Daft M.D.   On: 11/27/2021 17:40   DG Chest 1 View  Result Date: 11/27/2021 CLINICAL DATA:  Shortness of breath EXAM: CHEST  1 VIEW COMPARISON:  Chest x-ray dated September 06, 2021 FINDINGS: Cardiac and mediastinal contours are within normal limits for AP technique. Small bilateral pleural effusions and bibasilar atelectasis. No evidence of pneumothorax. IMPRESSION: Small bilateral pleural effusions and bibasilar atelectasis. Electronically Signed   By: Yetta Glassman M.D.   On: 11/27/2021 16:08    Cardiac Studies   Echocardiogram: 06/2021 IMPRESSIONS     1. Left ventricular ejection fraction, by estimation, is 35 to 40%. The  left ventricle has moderately decreased function. The left ventricle  demonstrates global hypokinesis. Left ventricular diastolic parameters are  consistent with Grade I diastolic  dysfunction (impaired relaxation).   2. Right ventricular systolic function is normal. The right ventricular  size is normal. There is moderately elevated pulmonary artery systolic  pressure. The estimated right ventricular systolic pressure is 97.0 mmHg.   3. The mitral valve is grossly normal. Trivial mitral valve   regurgitation. No evidence of mitral stenosis.   4. The aortic valve is tricuspid. There is mild calcification of the  aortic valve. Aortic valve regurgitation is not visualized. Aortic valve  sclerosis is present, with no evidence of aortic valve stenosis.   5. The inferior vena cava is dilated in size with >50% respiratory  variability, suggesting right atrial pressure of 8 mmHg.   Comparison(s): Changes from prior study are noted. EF has improved  ~35-40%.   Patient Profile     76 y.o. male  with a hx of PAD (s/p L AKA in 06/2020), persistent atrial fibrillation (diagnosed in 07/2020 and converted to NSR with IV Amiodarone, recurrence in 09/2020), HFrEF (EF 45% in 09/2020, at 25-30% in 12/2020, improved to 35-40% in 06/2021), HTN, Type 2 DM, PTSD, Schizophrenia and dementia who is currently admitted for an acute CHF exacerbation.   Assessment & Plan    1. Acute HFrEF - He has a known cardiomyopathy with EF at 35-40% in 06/2021 but this has likely worsened in the interim given his persistent tachycardia. BNP was elevated to 4086 on admission and CTA showed large bilateral effusions. Repeat echo not ordered this admission and would not change his management plan given he is planning to discharge with Home Hospice which seems appropriate given his multiple medical issues.  - He has been receiving IV Lasix 77m BID and I&O's have not been fully recorded and daily weights have not been obtained. Will see if a ReDS vest can be applied today to assist with assessing his volume status but still appears volume overloaded on exam. Creatinine overall stable at 1.94. Was not on a diuretic prior to admission but previously on Torsemide 423mdaily earlier this year. Would plan to restart a PO diuretic at discharge to assist with his respiratory status going forward.  - Will titrate Lopressor to 5016mID. Possibly switch to Toprol-XL prior to discharge. Not a candidate for ACE-I/ARB/ARNI/MRA given his soft  BP and his variable renal function. Would not start an SGLT2 inhibitor given his non-healing wounds and malnutrition.   2. Persistent Atrial Fibrillation/Flutter - He has a history of prior atrial fibrillation but was found to have more persistent atrial flutter during his admission in 08/2021. Telemetry this admission appears most consistent with atrial flutter as well with rates around 120 bpm at rest.  Will attempt to titrate Lopressor from 37.9m TID to 513mTID with hold parameters in place as his elevated rates are likely contributing to his CHF.  - As discussed in previous notes, he is not a candidate for anticoagulation given his recent GIB and persistent anemia.    3. HTN - His BP has been stable at 105/76 - 115/75 within the past 24 hours. Continue Lopressor and will attempt to adjust dosing as discussed above.    4. HLD - On Atorvastatin 4067maily PTA but currently held given his elevated LFT's.    5. PAD - He is s/p L AKA and has been followed closely by Vascular Surgery and Podiatry as an outpatient given gangrene of his right foot with transmetatarsal amputation in 07/2021. Was previously being followed for possible amputation but plans are for Palliative/Hospice services by review of most recent notes and no further intervention.  - Remains on antibiotic therapy along with Plavix 58m39mily.    6. Anemia - He has a history of GIB and was followed by GI during admission in 08/2021 but refused further work-up. Hgb at 6.9 this admission and received 1 unit pRBC's. Hgb stable at 7.7 today. Management per the admitting team.    7. Elevated LFT's - LFT's were elevated at the time of admission with AST 43, ALT 45 and alk phos 225. Similar today with values at 48, 47 and 209 respectively. Suspect secondary to hepatic congestion. Statin currently held.    8. Hypomagnesemia - Mg at 1.3 today. Replacement has already been ordered by the admitting team.   For questions or updates, please  contact [facility] consult [email or web address] for contact info under        Signed, BritErma Heritage-C  11/29/2021, 8:36 AM

## 2021-11-29 NOTE — Progress Notes (Signed)
PROGRESS NOTE    Allen Yu  SKA:[id number] DOB: 07/19/1945 DOA: 11/27/2021 PCP: [name], MD   Brief Narrative:    Allen Yu  is a 76 y.o. male, with medical history of dementia, PVD s/p left AKA, urinary retention with chronic Foley catheter, diabetes mellitus type 2, atrial fibrillation, chronic dry right gangrene, recently seen by vascular surgery, and patient refused amputation and decided to continue with palliative care as outpatient, came to ED with complaints of chest pain and also decreased oxygen saturation at home.  He was admitted with acute diastolic and systolic CHF exacerbation and has been started on IV diuresis with Lasix.  Cardiology following.  Palliative also consulted as his long-term prognosis is quite poor with his ongoing right lower extremity dry gangrene.  He has temporarily been started on IV Rocephin and Flagyl for this with no growth on blood cultures.   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 labs and urine output -cardiology team wants to diurese for another day with IV lasix, DC held until tomorrow  Chronic right foot dry gangrene in the setting of peripheral vascular disease -Patient opted to decline amputation with vascular surgery in October and opted for palliative care at home -Empirically started on Rocephin and Flagyl for now and blood cultures with no growth thus far -Appreciate palliative care evaluation for hopefully home hospice and may discontinue antibiotics at that time as it appears that use of antibiotics in this situation would be futile -Continue Plavix and hold Lipitor due to transaminitis -Staples remain from amputation performed 07/2021.  Would not remove staples at this time as this would disrupt tissues even further at this point  and increased risk of infection. -plan is to discharge home tomorrow with full hospice care for home   Transaminitis -Likely due to hepatic congestion in the setting of heart failure -Continue to monitor -Holding atorvastatin  Hypokalemia -Repleted orally and monitor carefully with aggressive diuresis  Hypomagnesemia - repleted IV, recheck in AM   Type 2 diabetes with hypoglycemia -Decrease long-acting insulin to 5 units at bedtime - added 3 am blood glucose testing, DC HS sliding scale coverage, add HS snack - low threshold to stop all insulin use for any further low BS readings CBG (last 3)  Recent Labs    11/28/21 2159 11/28/21 2242 11/29/21 0749  GLUCAP 38* 78 98     Dementia -Continue Aricept and Risperdal  Atrial fibrillation -Continue metoprolol 37.5 mg 3 times daily and anticipate switch to Toprol-XL by discharge per cardiology -Continue close monitoring on telemetry -No anticoagulation due to recent GI bleed  CKD stage IIIb -Creatinine stable and at baseline -Continue to monitor with aggressive diuresis   DVT prophylaxis: Lovenox Code Status: Partial code Family Communication: None at bedside Disposition Plan: Continue IV diuresis for heart failure and anticipate discharge once euvolemic hopefully with home hospice Status is: Inpatient Remains inpatient appropriate because: Need for IV medications.   Consultants:  Cardiology Palliative care  Procedures:  None  Antimicrobials:  Anti-infectives (From admission, onward)    Start     Dose/Rate Route Frequency Ordered Stop   11/27/21 1715  cefTRIAXone (ROCEPHIN) 2 g in sodium chloride 0.9 % 100 mL IVPB       See Hyperspace for full Linked Orders Report.   2 g  200 mL/hr over 30 Minutes Intravenous Every 24 hours 11/27/21 1703 12/04/21 1659   11/27/21 1715  metroNIDAZOLE (FLAGYL) IVPB 500 mg       See Hyperspace for full Linked Orders Report.   500 mg 100 mL/hr over 60 Minutes Intravenous Every 12  hours 11/27/21 1703 12/04/21 1659      Subjective: Patient reporting that he wants to go home today.   Objective: Vitals:   11/28/21 0458 11/28/21 1354 11/28/21 2110 11/29/21 0447  BP: 105/86 114/83 115/75 105/76  Pulse: (!) 123 (!) 121 99 (!) 116  Resp: '19 18 20 20  '$ Temp: 97.9 F (36.6 C) 98.4 F (36.9 C) (!) 97.5 F (36.4 C) 98.9 F (37.2 C)  TempSrc:  Oral    SpO2: 100% 100% 98% 99%  Weight:      Height:        Intake/Output Summary (Last 24 hours) at 11/29/2021 1058 Last data filed at 11/29/2021 0500 Gross per 24 hour  Intake 960 ml  Output 500 ml  Net 460 ml   Filed Weights   11/28/21 0015  Weight: 59.7 kg    Examination:  General exam: Appears calm and comfortable, very hard of hearing Respiratory system: Clear to auscultation. Respiratory effort normal. [place] Cardiovascular system: S1 & S2 heard, RRR.  Gastrointestinal system: Abdomen is soft Central nervous system: Alert and awake Extremities: Left AKA and right foot gangrene with staples present Skin: No significant lesions noted Psychiatry: Flat affect.  Data Reviewed: I have personally reviewed following labs and imaging studies  CBC: Recent Labs  Lab 11/27/21 1448 11/28/21 0450 11/29/21 0441  WBC 9.0 7.9 7.0  HGB 6.9* 7.9* 7.7*  HCT 21.6* 24.1* 23.6*  MCV 91.5 89.3 91.8  PLT 284 246 765   Basic Metabolic Panel: Recent Labs  Lab 11/27/21 1448 11/28/21 0450 11/29/21 0441  NA 137 139 136  K 3.8 3.0* 3.6  CL 103 102 103  CO2 20* 24 24  GLUCOSE 183* 117* 92  BUN 40* 40* 45*  CREATININE 1.92* 1.80* 1.94*  CALCIUM 8.2* 8.2* 7.4*  MG  --   --  1.3*   GFR: Estimated Creatinine Clearance: 27.8 mL/min (A) (by C-G formula based on SCr of 1.94 mg/dL (H)). Liver Function Tests: Recent Labs  Lab 11/27/21 1707 11/28/21 0450 11/29/21 0441  AST 43* 51* 48*  ALT 45* 48* 47*  ALKPHOS 225* 237* 209*  BILITOT 1.5* 1.0 0.6  PROT 6.7 6.5 5.8*  ALBUMIN 2.5* 2.4* 2.1*   No results for input(s):  "LIPASE", "AMYLASE" in the last 168 hours. No results for input(s): "AMMONIA" in the last 168 hours. Coagulation Profile: No results for input(s): "INR", "PROTIME" in the last 168 hours. Cardiac Enzymes: No results for input(s): "CKTOTAL", "CKMB", "CKMBINDEX", "TROPONINI" in the last 168 hours. BNP (last 3 results) No results for input(s): "PROBNP" in the last 8760 hours. HbA1C: No results for input(s): "HGBA1C" in the last 72 hours. CBG: Recent Labs  Lab 11/28/21 1628 11/28/21 2107 11/28/21 2159 11/28/21 2242 11/29/21 0749  GLUCAP 103* 65* 38* 78 98   Lipid Profile: No results for input(s): "CHOL", "HDL", "LDLCALC", "TRIG", "CHOLHDL", "LDLDIRECT" in the last 72 hours. Thyroid Function Tests: No results for input(s): "TSH", "T4TOTAL", "FREET4", "T3FREE", "THYROIDAB" in the last 72 hours. Anemia Panel: No results for input(s): "VITAMINB12", "FOLATE", "FERRITIN", "TIBC", "IRON", "RETICCTPCT" in the last 72 hours. Sepsis Labs: Recent Labs  Lab 11/27/21 1707 11/27/21 2304  LATICACIDVEN 2.4* 1.4    Recent Results (from  the past 240 hour(s))  Blood culture (routine x 2)     Status: None (Preliminary result)   Collection Time: 11/27/21  5:07 PM   Specimen: BLOOD  Result Value Ref Range Status   Specimen Description BLOOD BLOOD RIGHT WRIST  Final   Special Requests   Final    BOTTLES DRAWN AEROBIC AND ANAEROBIC Blood Culture adequate volume   Culture   Final    NO GROWTH 2 DAYS Performed at [facility], [address] [place], [place] [address]    Report Status PENDING  Incomplete  Blood culture (routine x 2)     Status: None (Preliminary result)   Collection Time: 11/27/21  5:07 PM   Specimen: BLOOD  Result Value Ref Range Status   Specimen Description BLOOD BLOOD RIGHT WRIST  Final   Special Requests   Final    BOTTLES DRAWN AEROBIC AND ANAEROBIC Blood Culture adequate volume   Culture   Final    NO GROWTH 2 DAYS Performed at [facility], [address]  [place], [place] [address]    Report Status PENDING  Incomplete      Radiology Studies: CT Angio Chest PE W and/or Wo Contrast  Result Date: 11/27/2021 CLINICAL DATA:  Shortness of breath chest pain. Pulmonary embolism suspected, high probability. EXAM: CT ANGIOGRAPHY CHEST WITH CONTRAST TECHNIQUE: Multidetector CT imaging of the chest was performed using the standard protocol during bolus administration of intravenous contrast. Multiplanar CT image reconstructions and MIPs were obtained to evaluate the vascular anatomy. RADIATION DOSE REDUCTION: This exam was performed according to the departmental dose-optimization program which includes automated exposure control, adjustment of the mA and/or kV according to patient size and/or use of iterative reconstruction technique. CONTRAST:  71m OMNIPAQUE IOHEXOL 350 MG/ML SOLN COMPARISON:  Chest CTA 06/29/2020.  Chest radiograph 11/27/2021 FINDINGS: Cardiovascular: Negative for pulmonary embolism. Enlargement of the right heart with contrast refluxing into the hepatic veins. Coronary artery calcifications. Normal caliber thoracic aorta with atherosclerotic calcifications. No significant pericardial effusion. Mediastinum/Nodes: No significant mediastinal or hilar lymph node enlargement. No axillary lymph node enlargement. Lungs/Pleura: Large bilateral pleural effusions. Centrilobular emphysema. Severe volume loss and consolidation in left lower lobe. Indeterminate 4 mm nodule in the left lower lobe on image 71/6. Compressive atelectasis in the posterior left upper lobe. Patient had a suspicious pulmonary nodule in the right upper lobe on the prior CT but this area is poorly characterized due to severe volume loss throughout the right lung. Nonspecific anterior pleural-based density in the right lung and image 60/6. Significant volume loss in the right lower lobe with compressive atelectasis from the large pleural effusion. Upper Abdomen: Low-density structures in left  kidney upper pole probably represent cysts but poorly characterized. Small calcifications in the kidneys could be vascular in etiology. Evidence for cholecystectomy. Musculoskeletal: Anterior plate and screw fixation in lower cervical spine. No acute bone abnormality. Review of the MIP images confirms the above findings. IMPRESSION: 1. Negative for pulmonary embolism. 2. Large bilateral pleural effusions with severe compressive atelectasis in the lower lobes. 3. Enlargement of the right heart with contrast refluxing into the hepatic veins. Findings are suggestive for right heart dysfunction. 4. Patient had a suspicious pulmonary nodule in the right upper lobe on the prior CT but this area is poorly characterized due to the severe volume loss. There is also an indeterminate 4 mm pulmonary nodule in the left lower lobe. Recommend follow-up chest CT after resolution of the pleural effusions. 5. Coronary artery calcifications. 6. Aortic  Atherosclerosis (ICD10-I70.0) and Emphysema (ICD10-J43.9). Electronically Signed   By: Markus Daft M.D.   On: 11/27/2021 17:40   DG Chest 1 View  Result Date: 11/27/2021 CLINICAL DATA:  Shortness of breath EXAM: CHEST  1 VIEW COMPARISON:  Chest x-ray dated September 06, 2021 FINDINGS: Cardiac and mediastinal contours are within normal limits for AP technique. Small bilateral pleural effusions and bibasilar atelectasis. No evidence of pneumothorax. IMPRESSION: Small bilateral pleural effusions and bibasilar atelectasis. Electronically Signed   By: Yetta Glassman M.D.   On: 11/27/2021 16:08    Scheduled Meds:  Chlorhexidine Gluconate Cloth  6 each Topical Daily   clopidogrel  75 mg Oral Daily   donepezil  10 mg Oral QHS   enoxaparin (LOVENOX) injection  30 mg Subcutaneous Q24H   furosemide  40 mg Intravenous BID   insulin aspart  0-5 Units Subcutaneous QHS   insulin aspart  0-9 Units Subcutaneous TID WC   insulin glargine-yfgn  5 Units Subcutaneous QHS   metoprolol tartrate   50 mg Oral Q8H   pantoprazole  40 mg Oral Daily   risperiDONE  0.5 mg Oral QHS   sodium chloride flush  3 mL Intravenous Q12H   Continuous Infusions:  sodium chloride     cefTRIAXone (ROCEPHIN)  IV 2 g (11/28/21 1836)   And   metronidazole 500 mg (11/29/21 0505)   magnesium sulfate bolus IVPB 4 g (11/29/21 0927)    LOS: 2 days   Time spent: 35 minutes Paeton Latouche, MD [facility]  If 7PM-7AM, please contact night-coverage [email or web address] 11/29/2021, 10:58 AM

## 2021-11-29 NOTE — TOC Progression Note (Signed)
Transition of Care [facility]) - Progression Note    Patient Details  Name: Allen Yu MRN: [id number] Date of Birth: August 29, 1945  Transition of Care [facility]) CM/SW Contact  Salome Arnt, [place] Phone Number: 11/29/2021, 11:24 AM  Clinical Narrative: LCSW spoke with Marchia Meiers at [facility] regarding respite care as requested by pt's wife. Marchia Meiers states she spoke with pt's wife this morning and she knows this is not an option. Marchia Meiers indicates pt's wife agreed to exterminate home due to bed bugs and hospice is agreeable to admit pt upon return home. MD updated.       Expected Discharge Plan: Home w Hospice Care Barriers to Discharge: Continued Medical Work up  Expected Discharge Plan and Services Expected Discharge Plan: [place] In-house Referral: Clinical Social Work Discharge Planning Services: CM Consult Post Acute Care Choice: Hospice Living arrangements for the past 2 months: [place] Determinants of Health (SDOH) Interventions    Readmission Risk Interventions    11/28/2021    2:15 PM 10/24/2021   11:22 AM 09/15/2021   12:14 PM  Readmission Risk Prevention Plan  Transportation Screening Complete Complete Complete  Medication Review Press photographer) Complete  Complete  PCP or Specialist appointment within 3-5 days of discharge  Not Complete Not Complete  PCP/Specialist Appt Not Complete comments  Pt going home w/ Hospice Pt going home with Hospice  HRI or [place] Complete Complete Complete  SW Recovery Care/Counseling Consult Complete Complete Complete  Palliative Care Screening Complete Complete Complete  [place] Not Applicable Not Applicable Not Applicable

## 2021-11-29 NOTE — Plan of Care (Signed)
  Problem: Metabolic: Goal: Ability to maintain appropriate glucose levels will improve Outcome: Not Progressing   Problem: Nutritional: Goal: Maintenance of adequate nutrition will improve Outcome: Not Progressing   Problem: Skin Integrity: Goal: Risk for impaired skin integrity will decrease Outcome: Not Progressing

## 2021-11-29 NOTE — Progress Notes (Signed)
Pt has been verbally aggressive with staff this shift, cursing and yelling down the hallways. Pt has refused evening accu-checks and vitals. Pt IV was noted to be hanging out of arm with swelling. IV removed completely and gauze dressing applied by this Probation officer. Pt currently in bed, call button in reach.

## 2021-11-29 NOTE — Progress Notes (Signed)
Patient back to yellow MEWS with Heart rate at 116, no escalation done as patient is known to be with this heart rate since admission, lopressor tablet given as scheduled, Charge Tina updated.      11/29/21 0447  Assess: MEWS Score  Temp 98.9 F (37.2 C)  BP 105/76  MAP (mmHg) 87  Pulse Rate (!) 116  Resp 20  SpO2 99 %  O2 Device Nasal Cannula  O2 Flow Rate (L/min) 2 L/min  Assess: MEWS Score  MEWS Temp 0  MEWS Systolic 0  MEWS Pulse 2  MEWS RR 0  MEWS LOC 0  MEWS Score 2  MEWS Score Color Yellow  Assess: if the MEWS score is Yellow or Red  Were vital signs taken at a resting state? Yes  Focused Assessment No change from prior assessment  Does the patient meet 2 or more of the SIRS criteria? No  MEWS guidelines implemented *See Row Information* No, previously yellow, continue vital signs every 4 hours  Treat  MEWS Interventions Administered scheduled meds/treatments  Breathing 0  Negative Vocalization 0  Facial Expression 0  Body Language 0  Consolability 0  PAINAD Score 0  Notify: Charge Nurse/RN  Name of Charge Nurse/RN Notified Tina RN  Date Charge Nurse/RN Notified 11/29/21  Time Charge Nurse/RN Notified 0559  Assess: SIRS CRITERIA  SIRS Temperature  0  SIRS Pulse 1  SIRS Respirations  0  SIRS WBC 1  SIRS Score Sum  2

## 2021-11-30 DIAGNOSIS — R071 Chest pain on breathing: Secondary | ICD-10-CM | POA: Diagnosis not present

## 2021-11-30 DIAGNOSIS — I96 Gangrene, not elsewhere classified: Secondary | ICD-10-CM | POA: Diagnosis not present

## 2021-11-30 DIAGNOSIS — I5043 Acute on chronic combined systolic (congestive) and diastolic (congestive) heart failure: Secondary | ICD-10-CM | POA: Diagnosis not present

## 2021-11-30 LAB — GLUCOSE, CAPILLARY
Glucose-Capillary: 293 mg/dL — ABNORMAL HIGH (ref 70–99)
Glucose-Capillary: 311 mg/dL — ABNORMAL HIGH (ref 70–99)

## 2021-11-30 MED ORDER — INSULIN GLARGINE 100 UNIT/ML ~~LOC~~ SOLN: 5.0000 [IU] | Freq: Every day | SUBCUTANEOUS | 11 refills | Status: AC

## 2021-11-30 MED ORDER — INSULIN GLARGINE-YFGN 100 UNIT/ML ~~LOC~~ SOLN
5.0000 [IU] | Freq: Every day | SUBCUTANEOUS | Status: DC
  Administered 2021-11-30: 5 [IU] via SUBCUTANEOUS
  Filled 2021-11-30 (×2): qty 0.05

## 2021-11-30 MED ORDER — METOPROLOL TARTRATE 50 MG PO TABS: 50.0000 mg | ORAL_TABLET | Freq: Three times a day (TID) | ORAL | 1 refills | Status: AC

## 2021-11-30 MED ORDER — ENSURE COMPLETE PO LIQD: 237.0000 mL | Freq: Two times a day (BID) | ORAL | Status: AC | PRN

## 2021-11-30 MED ORDER — POTASSIUM CHLORIDE CRYS ER 10 MEQ PO TBCR: 10.0000 meq | EXTENDED_RELEASE_TABLET | Freq: Every day | ORAL | 1 refills | Status: AC

## 2021-11-30 MED ORDER — FUROSEMIDE 40 MG PO TABS: 40.0000 mg | ORAL_TABLET | Freq: Every day | ORAL | 1 refills | Status: AC

## 2021-11-30 MED ORDER — RISPERIDONE 0.5 MG PO TABS: 0.5000 mg | ORAL_TABLET | Freq: Every day | ORAL | Status: AC

## 2021-11-30 MED ORDER — OXYCODONE HCL 5 MG PO TABS: 5.0000 mg | ORAL_TABLET | ORAL | 0 refills | Status: AC | PRN

## 2021-11-30 MED ORDER — INSULIN ASPART 100 UNIT/ML IJ SOLN
0.0000 [IU] | Freq: Three times a day (TID) | INTRAMUSCULAR | Status: DC
  Administered 2021-11-30: 7 [IU] via SUBCUTANEOUS

## 2021-11-30 NOTE — Discharge Instructions (Signed)
Please call Hospice Nurse First before calling 911 or going to the Emergency Room

## 2021-11-30 NOTE — Discharge Summary (Signed)
Physician Discharge Summary  Allen Yu QBH:[id number] DOB: Nov 25, 1945 DOA: 11/27/2021  PCP: [name], MD  Admit date: 11/27/2021 Discharge date: 11/30/2021  Admitted From:  Home  Disposition: Home with Hospice Care   Recommendations for Outpatient Follow-up:  Symptom Management per Hospice Protocols   Home Health:  HOSPICE  Discharge Condition: HOSPICE   CODE STATUS: DNR  DIET: COMFORT/HOSPICE    Brief Hospitalization Summary: Please see all hospital notes, images, labs for full details of the hospitalization. ADMISSION HPI:  Allen Yu  is a 76 y.o. male, with medical history of dementia, PVD s/p left AKA, urinary retention with chronic Foley catheter, diabetes mellitus type 2, atrial fibrillation, chronic dry right gangrene, recently seen by vascular surgery, and patient refused amputation and decided to continue with palliative care as outpatient, came to ED with complaints of chest pain and also decreased oxygen saturation at home.  He was admitted with acute diastolic and systolic CHF exacerbation and has been started on IV diuresis with Lasix.  Cardiology following.  Palliative also consulted as his long-term prognosis is quite poor with his ongoing right lower extremity dry gangrene.  He has temporarily been started on IV Rocephin and Flagyl for this with no growth on blood cultures.   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  Would not remove staples at this time as this would disrupt tissues even further at this point and increased risk of infection. -plan is to discharge home with full hospice care for home    Transaminitis -Likely due to hepatic congestion in the setting of heart failure -Holding atorvastatin   Hypokalemia -Repleted orally and monitor carefully with aggressive diuresis   Hypomagnesemia - repleted IV, recheck in AM    Type 2 diabetes with hypoglycemia -Decreased long-acting insulin to 5 units at bedtime - pt had been refusing CBG tests and refusing medications - low threshold to stop all insulin use for any further low BS readings - recommended that he check BS at least 2-3 times per day while at home - DC today home with hospice.    Dementia -Continue Aricept and Risperdal   Atrial fibrillation -Continue metoprolol 50 mg 3 times daily per cardiology -No anticoagulation due to recent GI bleed and discharge to hospice status    CKD stage IIIb -Creatinine stable and at baseline     Discharge Instructions:  Allergies as of 11/30/2021       Reactions   Bee Venom Anaphylaxis   Codeine Other (See Comments)   incoherent  Other reaction(s): Delirium   Propoxyphene Other (See Comments)   Dizziness, "Makes me feel drunk" Other reaction(s): Dizziness   Valsartan Other (See Comments)   incoherent Other reaction(s): Delirium        Medication List     STOP taking these medications    atorvastatin 40 MG tablet Commonly  known as: LIPITOR   D3-1000 25 MCG (1000 UT) tablet Generic drug: Cholecalciferol   ferrous sulfate 325 (65 FE) MG tablet   gabapentin 100 MG capsule Commonly known as: NEURONTIN   multivitamin with minerals tablet   traMADol 50 MG tablet Commonly known as: ULTRAM       TAKE  these medications    acetaminophen 500 MG tablet Commonly known as: TYLENOL Take 500 mg by mouth 2 (two) times daily as needed for moderate pain.   clopidogrel 75 MG tablet Commonly known as: Plavix Take 1 tablet (75 mg total) by mouth daily.   donepezil 10 MG tablet Commonly known as: ARICEPT Take 10 mg by mouth at bedtime.   feeding supplement (ENSURE COMPLETE) Liqd Take 237 mLs by mouth 2 (two) times daily as needed (Between meals).   furosemide 40 MG tablet Commonly known as: LASIX Take 1 tablet (40 mg total) by mouth daily. Start taking on: December 01, 2021   insulin glargine 100 UNIT/ML injection Commonly known as: LANTUS Inject 0.05 mLs (5 Units total) into the skin daily. Start taking on: December 01, 2021 What changed: how much to take   metoprolol tartrate 50 MG tablet Commonly known as: LOPRESSOR Take 1 tablet (50 mg total) by mouth every 8 (eight) hours. What changed:  medication strength how much to take when to take this   oxyCODONE 5 MG immediate release tablet Commonly known as: Oxy IR/ROXICODONE Take 1 tablet (5 mg total) by mouth every 4 (four) hours as needed for up to 5 days for severe pain. What changed: reasons to take this   potassium chloride 10 MEQ tablet Commonly known as: KLOR-CON M Take 1 tablet (10 mEq total) by mouth daily. Take only when taking furosemide (lasix) Start taking on: December 01, 2021   risperiDONE 0.5 MG tablet Commonly known as: RISPERDAL Take 1 tablet (0.5 mg total) by mouth at bedtime.        Allergies  Allergen Reactions   Bee Venom Anaphylaxis   Codeine Other (See Comments)    incoherent  Other reaction(s): Delirium    Propoxyphene Other (See Comments)    Dizziness, "Makes me feel drunk" Other reaction(s): Dizziness    Valsartan Other (See Comments)    incoherent Other reaction(s): Delirium    Allergies as of 11/30/2021       Reactions   Bee Venom Anaphylaxis   Codeine Other (See Comments)    incoherent  Other reaction(s): Delirium   Propoxyphene Other (See Comments)   Dizziness, "Makes me feel drunk" Other reaction(s): Dizziness   Valsartan Other (See Comments)   incoherent Other reaction(s): Delirium        Medication List     STOP taking these medications    atorvastatin 40 MG tablet Commonly known as: LIPITOR   D3-1000 25 MCG (1000 UT) tablet Generic drug: Cholecalciferol   ferrous sulfate 325 (65 FE) MG tablet   gabapentin 100 MG capsule Commonly known as: NEURONTIN   multivitamin with minerals tablet   traMADol 50 MG tablet Commonly known as: ULTRAM       TAKE these medications    acetaminophen 500 MG tablet Commonly known as: TYLENOL Take 500 mg by mouth 2 (two) times daily as needed for moderate pain.   clopidogrel 75 MG tablet Commonly known as: Plavix Take 1 tablet (75 mg total) by mouth daily.   donepezil 10 MG tablet Commonly known as: ARICEPT Take 10 mg by mouth at bedtime.   feeding supplement (ENSURE  COMPLETE) Liqd Take 237 mLs by mouth 2 (two) times daily as needed (Between meals).   furosemide 40 MG tablet Commonly known as: LASIX Take 1 tablet (40 mg total) by mouth daily. Start taking on: December 01, 2021   insulin glargine 100 UNIT/ML injection Commonly known as: LANTUS Inject 0.05 mLs (5 Units total) into the skin daily. Start taking on: December 01, 2021 What changed: how much to take   metoprolol tartrate 50 MG tablet Commonly known as: LOPRESSOR Take 1 tablet (50 mg total) by mouth every 8 (eight) hours. What changed:  medication strength how much to take when to take this   oxyCODONE 5 MG immediate release tablet Commonly known as: Oxy IR/ROXICODONE Take 1 tablet (5 mg total) by mouth every 4 (four) hours as needed for up to 5 days for severe pain. What changed: reasons to take this   potassium chloride 10 MEQ tablet Commonly known as: KLOR-CON M Take 1 tablet (10 mEq total) by mouth daily. Take only  when taking furosemide (lasix) Start taking on: December 01, 2021   risperiDONE 0.5 MG tablet Commonly known as: RISPERDAL Take 1 tablet (0.5 mg total) by mouth at bedtime.        Procedures/Studies: CT Angio Chest PE W and/or Wo Contrast  Result Date: 11/27/2021 CLINICAL DATA:  Shortness of breath chest pain. Pulmonary embolism suspected, high probability. EXAM: CT ANGIOGRAPHY CHEST WITH CONTRAST TECHNIQUE: Multidetector CT imaging of the chest was performed using the standard protocol during bolus administration of intravenous contrast. Multiplanar CT image reconstructions and MIPs were obtained to evaluate the vascular anatomy. RADIATION DOSE REDUCTION: This exam was performed according to the departmental dose-optimization program which includes automated exposure control, adjustment of the mA and/or kV according to patient size and/or use of iterative reconstruction technique. CONTRAST:  39m OMNIPAQUE IOHEXOL 350 MG/ML SOLN COMPARISON:  Chest CTA 06/29/2020.  Chest radiograph 11/27/2021 FINDINGS: Cardiovascular: Negative for pulmonary embolism. Enlargement of the right heart with contrast refluxing into the hepatic veins. Coronary artery calcifications. Normal caliber thoracic aorta with atherosclerotic calcifications. No significant pericardial effusion. Mediastinum/Nodes: No significant mediastinal or hilar lymph node enlargement. No axillary lymph node enlargement. Lungs/Pleura: Large bilateral pleural effusions. Centrilobular emphysema. Severe volume loss and consolidation in left lower lobe. Indeterminate 4 mm nodule in the left lower lobe on image 71/6. Compressive atelectasis in the posterior left upper lobe. Patient had a suspicious pulmonary nodule in the right upper lobe on the prior CT but this area is poorly characterized due to severe volume loss throughout the right lung. Nonspecific anterior pleural-based density in the right lung and image 60/6. Significant volume loss in the  right lower lobe with compressive atelectasis from the large pleural effusion. Upper Abdomen: Low-density structures in left kidney upper pole probably represent cysts but poorly characterized. Small calcifications in the kidneys could be vascular in etiology. Evidence for cholecystectomy. Musculoskeletal: Anterior plate and screw fixation in lower cervical spine. No acute bone abnormality. Review of the MIP images confirms the above findings. IMPRESSION: 1. Negative for pulmonary embolism. 2. Large bilateral pleural effusions with severe compressive atelectasis in the lower lobes. 3. Enlargement of the right heart with contrast refluxing into the hepatic veins. Findings are suggestive for right heart dysfunction. 4. Patient had a suspicious pulmonary nodule in the right upper lobe on the prior CT but this area is poorly characterized due to the severe volume loss. There is also an indeterminate 4 mm pulmonary nodule in the left lower lobe. Recommend follow-up  chest CT after resolution of the pleural effusions. 5. Coronary artery calcifications. 6. Aortic Atherosclerosis (ICD10-I70.0) and Emphysema (ICD10-J43.9). Electronically Signed   By: Markus Daft M.D.   On: 11/27/2021 17:40   DG Chest 1 View  Result Date: 11/27/2021 CLINICAL DATA:  Shortness of breath EXAM: CHEST  1 VIEW COMPARISON:  Chest x-ray dated September 06, 2021 FINDINGS: Cardiac and mediastinal contours are within normal limits for AP technique. Small bilateral pleural effusions and bibasilar atelectasis. No evidence of pneumothorax. IMPRESSION: Small bilateral pleural effusions and bibasilar atelectasis. Electronically Signed   By: Yetta Glassman M.D.   On: 11/27/2021 16:08     Subjective: Pt demanding to get home.  He is agreeable to home hospice and understands that he is dying.  He is upset wife did not pick him up yesterday.  He has been uncooperative with staff.    Discharge Exam: Vitals:   11/29/21 0447 11/30/21 0446  BP: 105/76  137/89  Pulse: (!) 116 (!) 126  Resp: 20   Temp: 98.9 F (37.2 C) 98.5 F (36.9 C)  SpO2: 99% 100%   Vitals:   11/28/21 1354 11/28/21 2110 11/29/21 0447 11/30/21 0446  BP: 114/83 115/75 105/76 137/89  Pulse: (!) 121 99 (!) 116 (!) 126  Resp: '18 20 20   '$ Temp:   98.9 F (37.2 C) 98.5 F (36.9 C)  TempSrc: Oral     SpO2: 100% 98% 99% 100%  Weight:      Height:       General exam: Appears calm and comfortable, very hard of hearing Respiratory system: Clear to auscultation. Respiratory effort normal. [place] Cardiovascular system: S1 & S2 heard, RRR.  Gastrointestinal system: Abdomen is soft Central nervous system: Alert and awake Extremities: Left AKA and right foot gangrene with staples present Skin: No significant lesions noted Psychiatry: Flat affect.   The results of significant diagnostics from this hospitalization (including imaging, microbiology, ancillary and laboratory) are listed below for reference.     Microbiology: Recent Results (from the past 240 hour(s))  Blood culture (routine x 2)     Status: None (Preliminary result)   Collection Time: 11/27/21  5:07 PM   Specimen: BLOOD  Result Value Ref Range Status   Specimen Description BLOOD BLOOD RIGHT WRIST  Final   Special Requests   Final    BOTTLES DRAWN AEROBIC AND ANAEROBIC Blood Culture adequate volume   Culture   Final    NO GROWTH 2 DAYS Performed at [facility] [address] [place], [place] [address]    Report Status PENDING  Incomplete  Blood culture (routine x 2)     Status: None (Preliminary result)   Collection Time: 11/27/21  5:07 PM   Specimen: BLOOD  Result Value Ref Range Status   Specimen Description BLOOD BLOOD RIGHT WRIST  Final   Special Requests   Final    BOTTLES DRAWN AEROBIC AND ANAEROBIC Blood Culture adequate volume   Culture   Final    NO GROWTH 2 DAYS Performed at [facility], [address] [place], [place] [address]    Report Status PENDING  Incomplete     Labs: BNP  (last 3 results) Recent Labs    07/04/21 1058 09/06/21 1440 11/27/21 1717  BNP 685.0* 176.9* 1,062.6*   Basic Metabolic Panel: Recent Labs  Lab 11/27/21 1448 11/28/21 0450 11/29/21 0441  NA 137 139 136  K 3.8 3.0* 3.6  CL 103 102 103  CO2 20* 24 24  GLUCOSE 183*  117* 92  BUN 40* 40* 45*  CREATININE 1.92* 1.80* 1.94*  CALCIUM 8.2* 8.2* 7.4*  MG  --   --  1.3*   Liver Function Tests: Recent Labs  Lab 11/27/21 1707 11/28/21 0450 11/29/21 0441  AST 43* 51* 48*  ALT 45* 48* 47*  ALKPHOS 225* 237* 209*  BILITOT 1.5* 1.0 0.6  PROT 6.7 6.5 5.8*  ALBUMIN 2.5* 2.4* 2.1*   No results for input(s): "LIPASE", "AMYLASE" in the last 168 hours. No results for input(s): "AMMONIA" in the last 168 hours. CBC: Recent Labs  Lab 11/27/21 1448 11/28/21 0450 11/29/21 0441  WBC 9.0 7.9 7.0  HGB 6.9* 7.9* 7.7*  HCT 21.6* 24.1* 23.6*  MCV 91.5 89.3 91.8  PLT 284 246 203   Cardiac Enzymes: No results for input(s): "CKTOTAL", "CKMB", "CKMBINDEX", "TROPONINI" in the last 168 hours. BNP: Invalid input(s): "POCBNP" CBG: Recent Labs  Lab 11/28/21 2242 11/29/21 0749 11/29/21 1130 11/30/21 0445 11/30/21 0732  GLUCAP 78 98 106* 293* 311*   D-Dimer No results for input(s): "DDIMER" in the last 72 hours. Hgb A1c No results for input(s): "HGBA1C" in the last 72 hours. Lipid Profile No results for input(s): "CHOL", "HDL", "LDLCALC", "TRIG", "CHOLHDL", "LDLDIRECT" in the last 72 hours. Thyroid function studies No results for input(s): "TSH", "T4TOTAL", "T3FREE", "THYROIDAB" in the last 72 hours.  Invalid input(s): "FREET3" Anemia work up No results for input(s): "VITAMINB12", "FOLATE", "FERRITIN", "TIBC", "IRON", "RETICCTPCT" in the last 72 hours. Urinalysis    Component Value Date/Time   COLORURINE YELLOW 10/23/2021 0626   APPEARANCEUR CLOUDY (A) 10/23/2021 0626   LABSPEC 1.008 10/23/2021 0626   PHURINE 6.0 10/23/2021 0626   GLUCOSEU NEGATIVE 10/23/2021 0626   HGBUR  MODERATE (A) 10/23/2021 0626   BILIRUBINUR NEGATIVE 10/23/2021 0626   KETONESUR NEGATIVE 10/23/2021 0626   PROTEINUR 100 (A) 10/23/2021 0626   UROBILINOGEN 0.2 02/18/2014 0950   NITRITE POSITIVE (A) 10/23/2021 0626   LEUKOCYTESUR LARGE (A) 10/23/2021 0626   Sepsis Labs Recent Labs  Lab 11/27/21 1448 11/28/21 0450 11/29/21 0441  WBC 9.0 7.9 7.0   Microbiology Recent Results (from the past 240 hour(s))  Blood culture (routine x 2)     Status: None (Preliminary result)   Collection Time: 11/27/21  5:07 PM   Specimen: BLOOD  Result Value Ref Range Status   Specimen Description BLOOD BLOOD RIGHT WRIST  Final   Special Requests   Final    BOTTLES DRAWN AEROBIC AND ANAEROBIC Blood Culture adequate volume   Culture   Final    NO GROWTH 2 DAYS Performed at [facility], [address] [place], [place] [address]    Report Status PENDING  Incomplete  Blood culture (routine x 2)     Status: None (Preliminary result)   Collection Time: 11/27/21  5:07 PM   Specimen: BLOOD  Result Value Ref Range Status   Specimen Description BLOOD BLOOD RIGHT WRIST  Final   Special Requests   Final    BOTTLES DRAWN AEROBIC AND ANAEROBIC Blood Culture adequate volume   Culture   Final    NO GROWTH 2 DAYS Performed at [facility], [address], [place],  [address]    Report Status PENDING  Incomplete   Time coordinating discharge: 35 mins   SIGNED:  Irwin Brakeman, MD  [facility] 11/30/2021, 8:49 AM How to contact the [facility] Attending or Consulting provider [place] or covering provider during after hours [place], for this patient?  Check the care  team in [facility] and look for a) attending/consulting [place] provider listed and b) the [facility] team listed Log into [email or web address] and use [place]'s universal password to access. If you do not have the password, please contact the hospital operator. Locate the [facility] provider you are looking for under [facility] and page to a number that you  can be directly reached. If you still have difficulty reaching the provider, please page the [facility] (Director on Call) for the Hospitalists listed on amion for assistance.

## 2021-11-30 NOTE — Progress Notes (Signed)
Pt's wife is concerned regarding significant swelling/pitting edema of penis, right scrotum, bilateral upper thighs and entire abd. She states that his penis/scrotum have never been this swollen and that the edema in his upper thighs and abd is new. I haven't had this patient before, so I cannot validate her claim. I do not see any documentation in the patient's chart for this admission regarding edema of penis, scrotum and abd. Dr. Wynetta Emery came to bedside and explained that "that's is part of his severe heart failure, that's why he is on hospice, there is nothing else that can be done except for  him to continue taking lasix, but he is dying now and he insisted on going home instead of going to a hospice facility" Wife thanked him for  coming and and said she understood. AVS was giving to wife and explained that hospice nurse would come to house and that things may change. Wife and husband was wheeled to the exit and family members was able to assist

## 2021-11-30 NOTE — Progress Notes (Signed)
Pt very upset tonight. Screaming for help. When I walked in room pt stated he wanted to get out of bed. Adv pt that we could not get him out of bed at the moment since it was 9pm. Pt yelled "why the fuck not!!" Pt adv again that it was late and he need to stay in bed. As nurse left room pt started to yell inappropriately. Tech went in to room to get vitals and CBG. Pt refused and told tech to leave him "the hell alone". Pt also refused medications tonight.

## 2021-11-30 NOTE — Progress Notes (Signed)
pt yelling out nursing staff came to bedside pt demanded something for pain prn administered. Will continue to monitor.

## 2021-11-30 NOTE — Care Management Important Message (Signed)
Important Message  Patient Details  Name: Allen Yu MRN: [id number] Date of Birth: 1945/07/28   Medicare Important Message Given:  N/A - LOS <3 / Initial given by admissions     [name] 11/30/2021, 8:59 AM

## 2021-11-30 NOTE — TOC Transition Note (Signed)
Transition of Care [facility]) - CM/SW Discharge Note   Patient Details  Name: Allen Yu MRN: [id number] Date of Birth: 22-Aug-1945  [facility] CM/SW Contact:  Iona Beard, [place] Phone Number: 11/30/2021, 9:40 AM   Clinical Narrative:    CSW updated [facility]ia Meiers [facility] that pt will D/C home today. Marchia Meiers states they will have someone out to see pt tomorrow. Marchia Meiers also states that pts wife is not interested in a hospital bed so it has not been ordered at this time. Hospice will continue to follow pt. TOC signing off.   Final next level of care: Home w Hospice Care Barriers to Discharge: Barriers Resolved   Patient Goals and CMS Choice Patient states their goals for this hospitalization and ongoing recovery are:: home with hospice CMS Medicare.gov Compare Post Acute Care list provided to:: Patient Represenative (must comment) Choice offered to / list presented to : Spouse, Patient  Discharge Placement                       Discharge Plan and Services In-house Referral: Clinical Social Work Discharge Planning Services: CM Consult Post Acute Care Choice: Hospice                               Social Determinants of Health (SDOH) Interventions     Readmission Risk Interventions    11/28/2021    2:15 PM 10/24/2021   11:22 AM 09/15/2021   12:14 PM  Readmission Risk Prevention Plan  Transportation Screening Complete Complete Complete  Medication Review Press photographer) Complete  Complete  PCP or Specialist appointment within 3-5 days of discharge  Not Complete Not Complete  PCP/Specialist Appt Not Complete comments  Pt going home w/ Hospice Pt going home with Hospice  [place] or [place] Complete Complete Complete  SW Recovery Care/Counseling Consult Complete Complete Complete  Palliative Care Screening Complete Complete Complete  [place] Not Applicable Not Applicable Not Applicable

## 2021-12-01 ENCOUNTER — Ambulatory Visit: Payer: TRICARE For Life (TFL) | Admitting: Cardiology

## 2021-12-01 DIAGNOSIS — I5023 Acute on chronic systolic (congestive) heart failure: Secondary | ICD-10-CM | POA: Diagnosis not present

## 2021-12-01 DIAGNOSIS — I10 Essential (primary) hypertension: Secondary | ICD-10-CM | POA: Diagnosis not present

## 2021-12-01 DIAGNOSIS — F4312 Post-traumatic stress disorder, chronic: Secondary | ICD-10-CM | POA: Diagnosis not present

## 2021-12-01 DIAGNOSIS — F0390 Unspecified dementia without behavioral disturbance: Secondary | ICD-10-CM | POA: Diagnosis not present

## 2021-12-01 DIAGNOSIS — Z89612 Acquired absence of left leg above knee: Secondary | ICD-10-CM | POA: Diagnosis not present

## 2021-12-01 DIAGNOSIS — I509 Heart failure, unspecified: Secondary | ICD-10-CM | POA: Diagnosis not present

## 2021-12-01 DIAGNOSIS — K219 Gastro-esophageal reflux disease without esophagitis: Secondary | ICD-10-CM | POA: Diagnosis not present

## 2021-12-01 DIAGNOSIS — F209 Schizophrenia, unspecified: Secondary | ICD-10-CM | POA: Diagnosis not present

## 2021-12-01 DIAGNOSIS — I4891 Unspecified atrial fibrillation: Secondary | ICD-10-CM | POA: Diagnosis not present

## 2021-12-01 DIAGNOSIS — E46 Unspecified protein-calorie malnutrition: Secondary | ICD-10-CM | POA: Diagnosis not present

## 2021-12-01 DIAGNOSIS — E1165 Type 2 diabetes mellitus with hyperglycemia: Secondary | ICD-10-CM | POA: Diagnosis not present

## 2021-12-01 DIAGNOSIS — E785 Hyperlipidemia, unspecified: Secondary | ICD-10-CM | POA: Diagnosis not present

## 2021-12-01 DIAGNOSIS — I739 Peripheral vascular disease, unspecified: Secondary | ICD-10-CM | POA: Diagnosis not present

## 2021-12-02 LAB — CULTURE, BLOOD (ROUTINE X 2)
Culture: NO GROWTH
Culture: NO GROWTH
Special Requests: ADEQUATE
Special Requests: ADEQUATE

## 2021-12-06 DIAGNOSIS — I4891 Unspecified atrial fibrillation: Secondary | ICD-10-CM | POA: Diagnosis not present

## 2021-12-06 DIAGNOSIS — E46 Unspecified protein-calorie malnutrition: Secondary | ICD-10-CM | POA: Diagnosis not present

## 2021-12-06 DIAGNOSIS — Z89612 Acquired absence of left leg above knee: Secondary | ICD-10-CM | POA: Diagnosis not present

## 2021-12-06 DIAGNOSIS — I10 Essential (primary) hypertension: Secondary | ICD-10-CM | POA: Diagnosis not present

## 2021-12-06 DIAGNOSIS — I739 Peripheral vascular disease, unspecified: Secondary | ICD-10-CM | POA: Diagnosis not present

## 2021-12-06 DIAGNOSIS — F209 Schizophrenia, unspecified: Secondary | ICD-10-CM | POA: Diagnosis not present

## 2021-12-12 DIAGNOSIS — I10 Essential (primary) hypertension: Secondary | ICD-10-CM | POA: Diagnosis not present

## 2021-12-12 DIAGNOSIS — E46 Unspecified protein-calorie malnutrition: Secondary | ICD-10-CM | POA: Diagnosis not present

## 2021-12-12 DIAGNOSIS — I4891 Unspecified atrial fibrillation: Secondary | ICD-10-CM | POA: Diagnosis not present

## 2021-12-12 DIAGNOSIS — I739 Peripheral vascular disease, unspecified: Secondary | ICD-10-CM | POA: Diagnosis not present

## 2021-12-12 DIAGNOSIS — I96 Gangrene, not elsewhere classified: Secondary | ICD-10-CM | POA: Diagnosis not present

## 2021-12-12 DIAGNOSIS — I5023 Acute on chronic systolic (congestive) heart failure: Secondary | ICD-10-CM | POA: Diagnosis not present

## 2021-12-12 DIAGNOSIS — E1129 Type 2 diabetes mellitus with other diabetic kidney complication: Secondary | ICD-10-CM | POA: Diagnosis not present

## 2021-12-12 DIAGNOSIS — Z89612 Acquired absence of left leg above knee: Secondary | ICD-10-CM | POA: Diagnosis not present

## 2021-12-12 DIAGNOSIS — F209 Schizophrenia, unspecified: Secondary | ICD-10-CM | POA: Diagnosis not present

## 2021-12-13 DIAGNOSIS — I10 Essential (primary) hypertension: Secondary | ICD-10-CM | POA: Diagnosis not present

## 2021-12-13 DIAGNOSIS — I4891 Unspecified atrial fibrillation: Secondary | ICD-10-CM | POA: Diagnosis not present

## 2021-12-13 DIAGNOSIS — F209 Schizophrenia, unspecified: Secondary | ICD-10-CM | POA: Diagnosis not present

## 2021-12-13 DIAGNOSIS — I739 Peripheral vascular disease, unspecified: Secondary | ICD-10-CM | POA: Diagnosis not present

## 2021-12-13 DIAGNOSIS — Z89612 Acquired absence of left leg above knee: Secondary | ICD-10-CM | POA: Diagnosis not present

## 2021-12-13 DIAGNOSIS — E46 Unspecified protein-calorie malnutrition: Secondary | ICD-10-CM | POA: Diagnosis not present

## 2021-12-14 DIAGNOSIS — I4891 Unspecified atrial fibrillation: Secondary | ICD-10-CM | POA: Diagnosis not present

## 2021-12-14 DIAGNOSIS — I739 Peripheral vascular disease, unspecified: Secondary | ICD-10-CM | POA: Diagnosis not present

## 2021-12-14 DIAGNOSIS — Z89612 Acquired absence of left leg above knee: Secondary | ICD-10-CM | POA: Diagnosis not present

## 2021-12-14 DIAGNOSIS — F209 Schizophrenia, unspecified: Secondary | ICD-10-CM | POA: Diagnosis not present

## 2021-12-14 DIAGNOSIS — I10 Essential (primary) hypertension: Secondary | ICD-10-CM | POA: Diagnosis not present

## 2021-12-14 DIAGNOSIS — E46 Unspecified protein-calorie malnutrition: Secondary | ICD-10-CM | POA: Diagnosis not present

## 2021-12-15 DIAGNOSIS — F209 Schizophrenia, unspecified: Secondary | ICD-10-CM | POA: Diagnosis not present

## 2021-12-15 DIAGNOSIS — I739 Peripheral vascular disease, unspecified: Secondary | ICD-10-CM | POA: Diagnosis not present

## 2021-12-15 DIAGNOSIS — E46 Unspecified protein-calorie malnutrition: Secondary | ICD-10-CM | POA: Diagnosis not present

## 2021-12-15 DIAGNOSIS — I4891 Unspecified atrial fibrillation: Secondary | ICD-10-CM | POA: Diagnosis not present

## 2021-12-15 DIAGNOSIS — I10 Essential (primary) hypertension: Secondary | ICD-10-CM | POA: Diagnosis not present

## 2021-12-15 DIAGNOSIS — Z89612 Acquired absence of left leg above knee: Secondary | ICD-10-CM | POA: Diagnosis not present

## 2021-12-22 DEATH — deceased

## 2023-06-30 IMAGING — DX DG CHEST 1V PORT
1 series · 1 of 1 positions shown · non-contrast
Comparison: April 10, 2021.

CLINICAL DATA: Cough.

EXAM:
PORTABLE CHEST 1 VIEW

[chest ap]
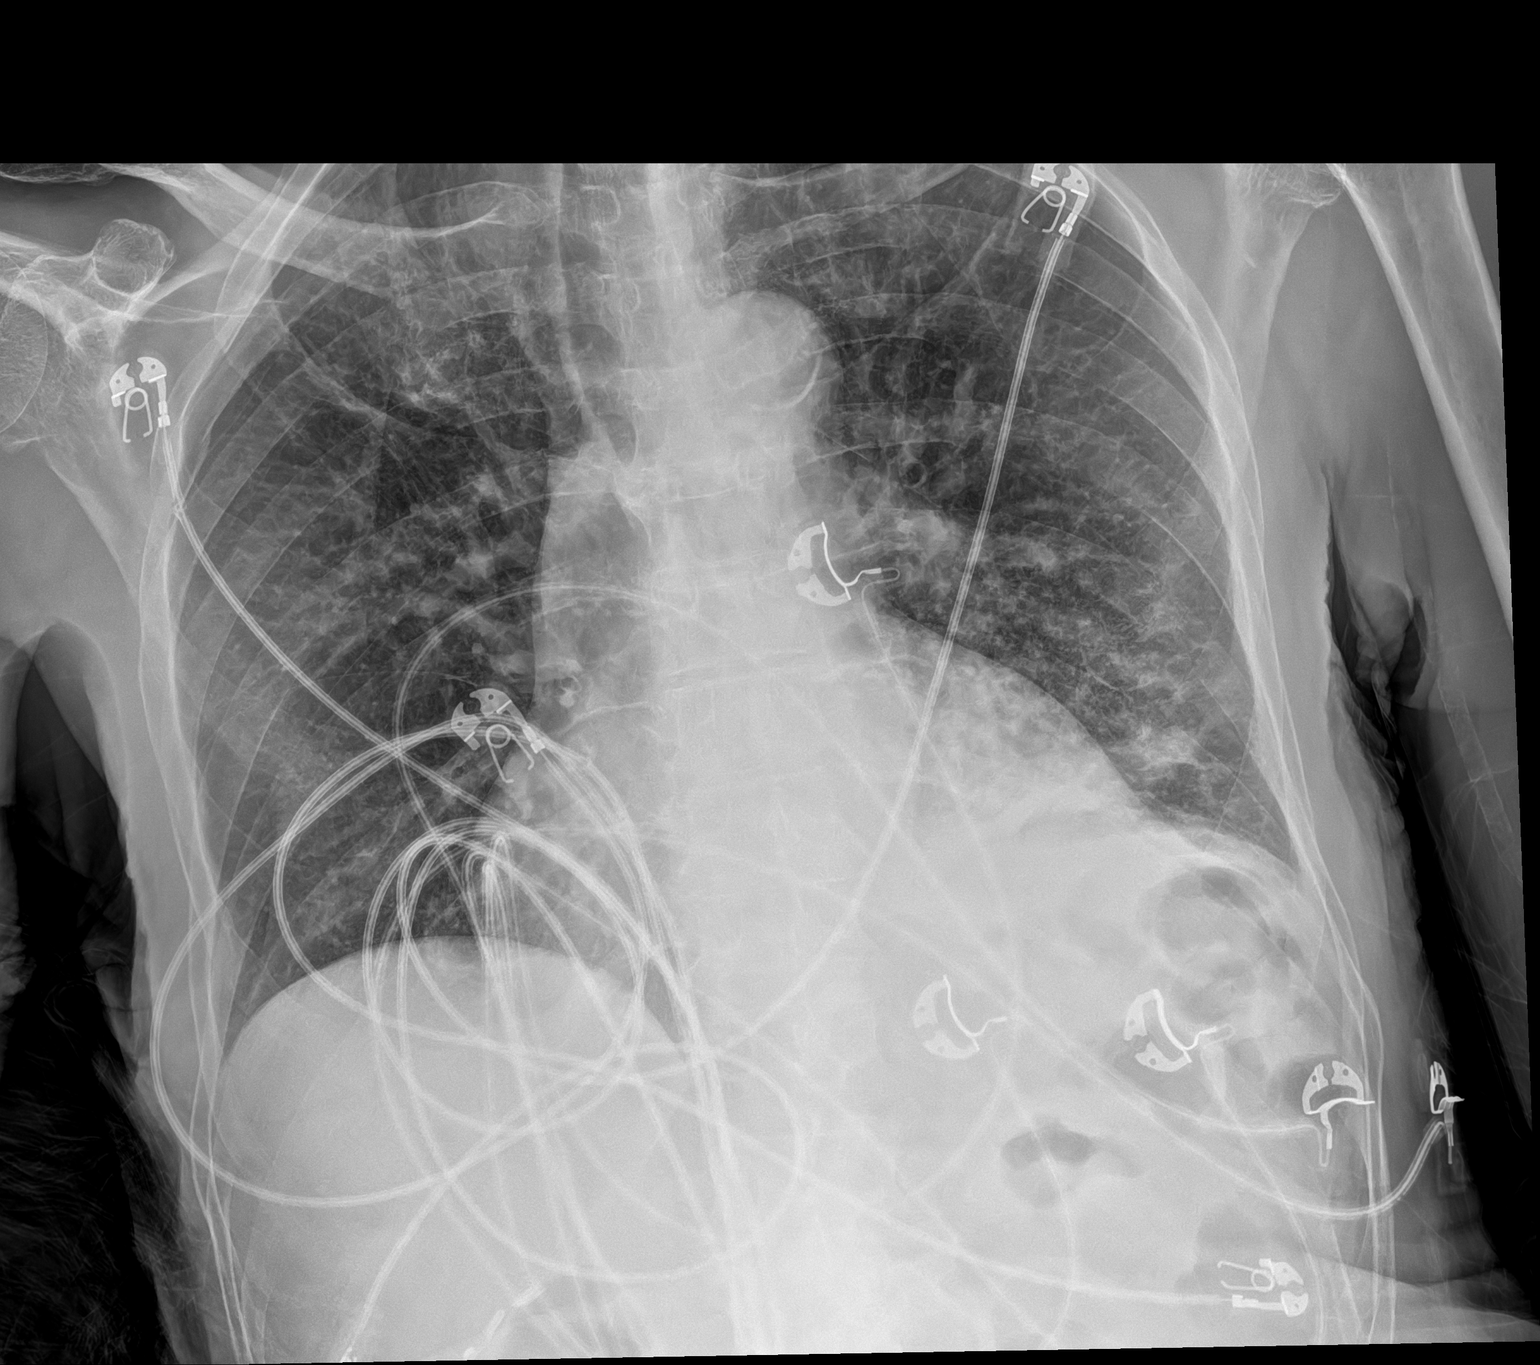

[1 of 1 positions shown; findings below may reference images not displayed]

FINDINGS: Stable cardiomediastinal silhouette. Right lung is clear. Increased
left basilar opacity is noted concerning for worsening pneumonia.
Bony thorax is unremarkable.
IMPRESSION: Increased left basilar opacity is noted concerning for worsening
pneumonia.

## 2023-06-30 IMAGING — CT CT RENAL STONE PROTOCOL
2 of 4 series · 13 of 46 positions shown, 15 images · non-contrast
Comparison: CT abdomen and pelvis 07/21/2020; chest two views
04/11/2021 AP chest 01/23/2021

CLINICAL DATA: Flank pain. Kidney stones suspected. Presented to ED
yesterday and was treated for acute cystitis. Now positive blood
culture lab work.



[Series 2: axial st · axial · 0.79mm/px · z∈[+562,+926]mm · 10 of 83 slices shown, 12 images]
[im 5/83  soft-tissue]
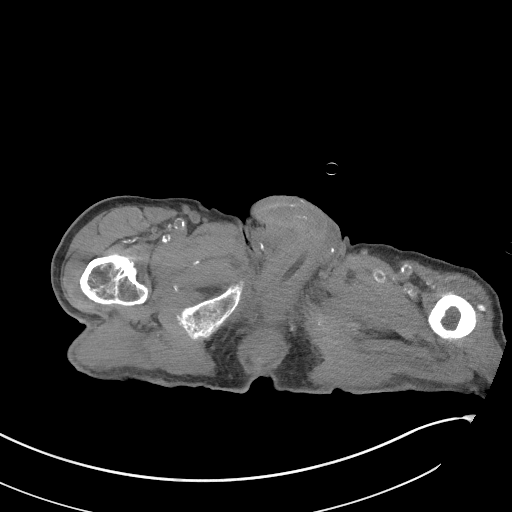
[im 5/83  bone]
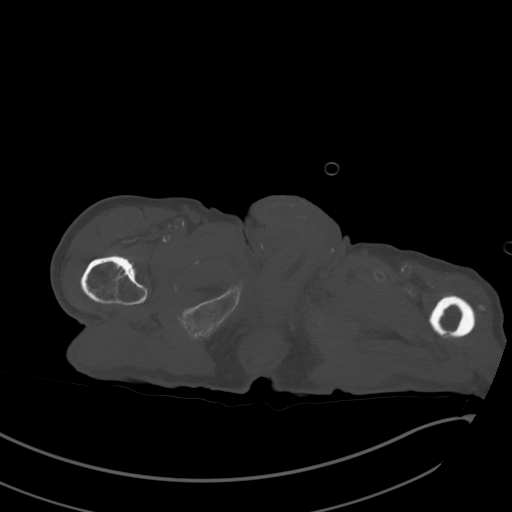
[im 13/83  soft-tissue]
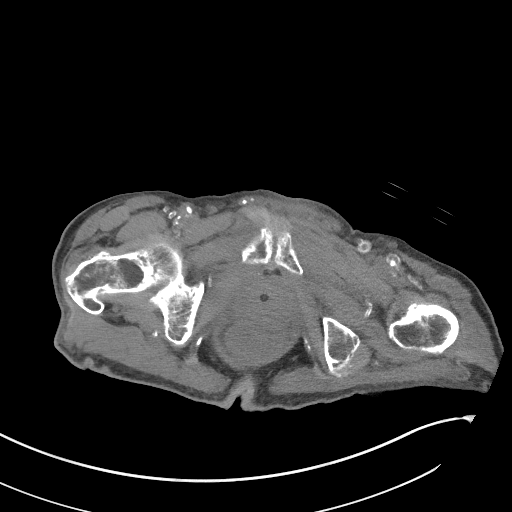
[im 21/83  soft-tissue]
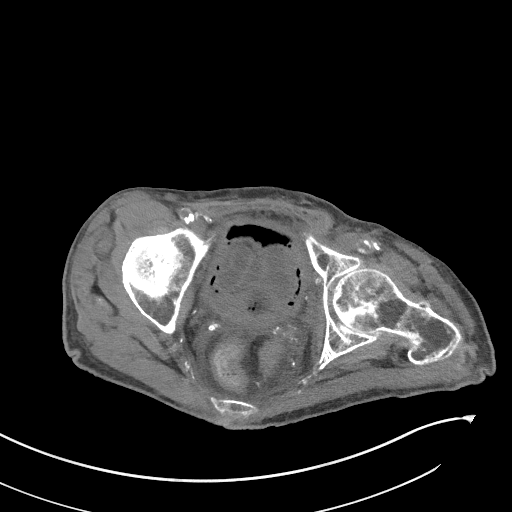
[im 29/83  soft-tissue]
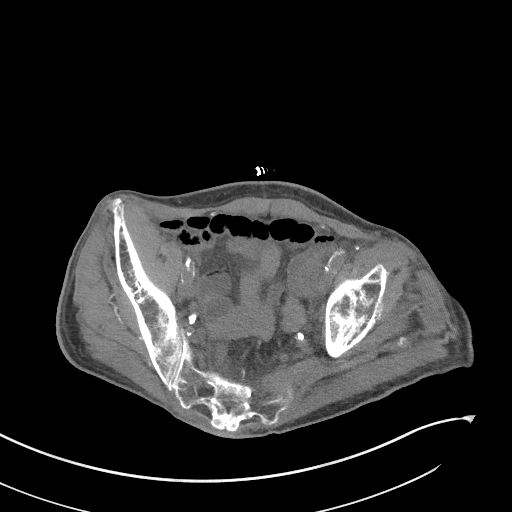
[im 37/83  soft-tissue]
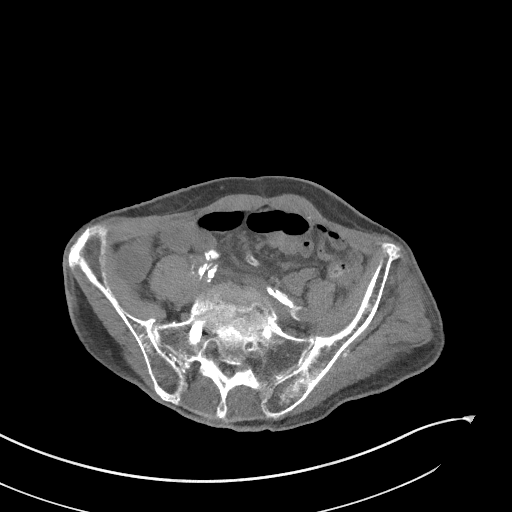
[im 46/83  soft-tissue]
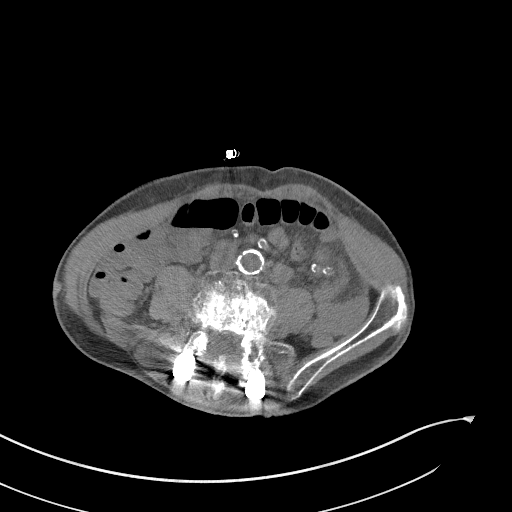
[im 54/83  soft-tissue]
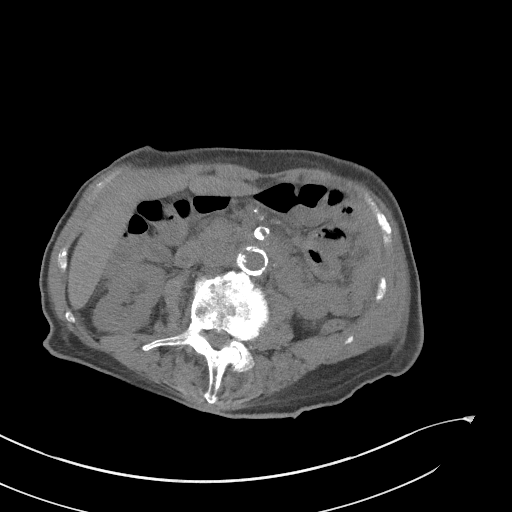
[im 62/83  soft-tissue]
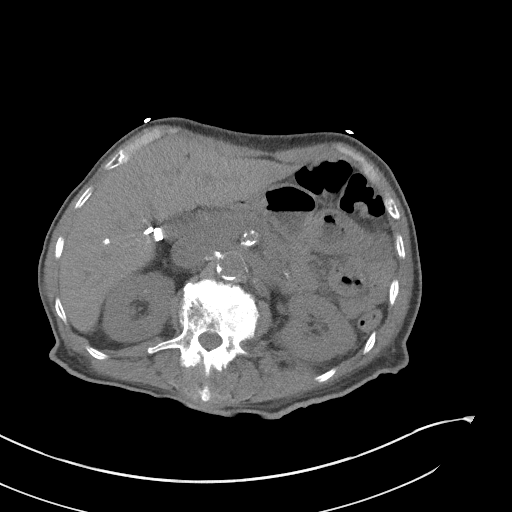
[im 70/83  soft-tissue]
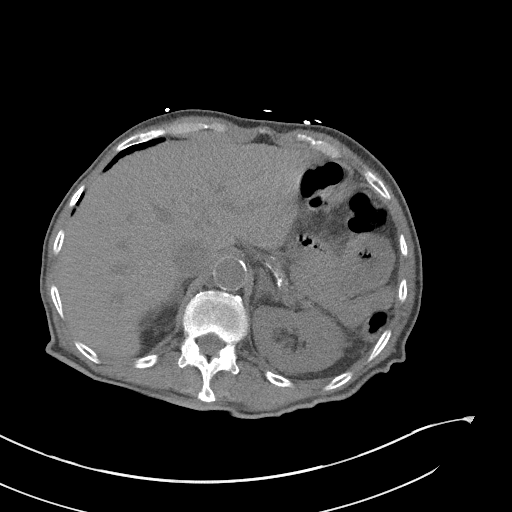
[im 70/83  bone]
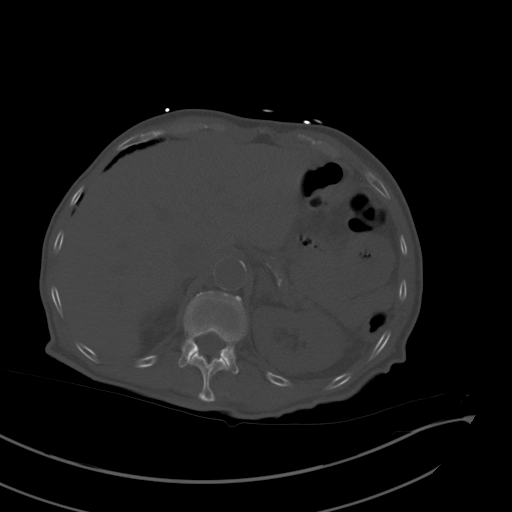
[im 78/83  soft-tissue]
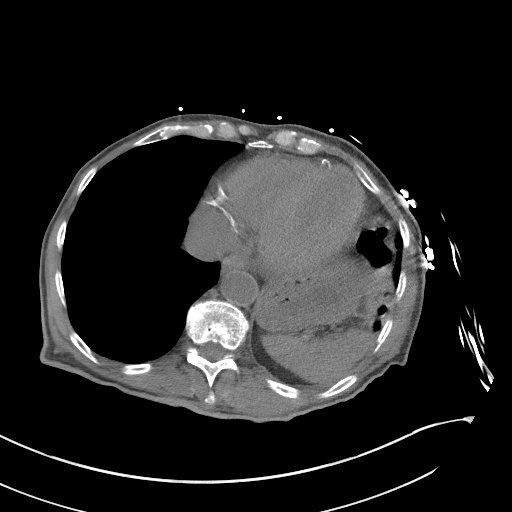

[Series 5: coronal st · coronal · 0.69mm/px · 3 of 83 slices shown]
[im 28/83  soft-tissue]
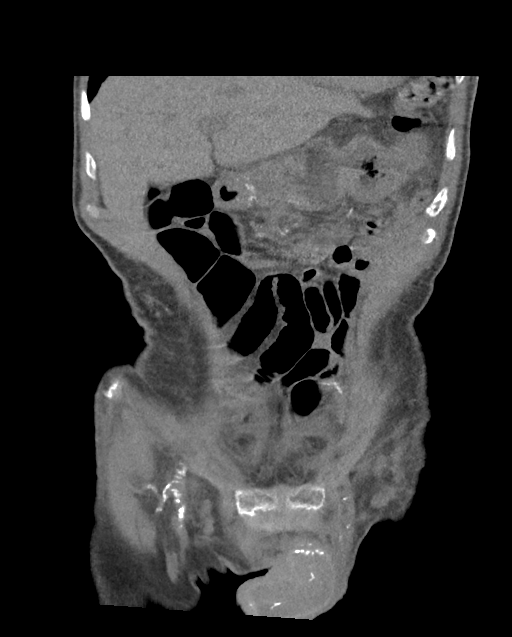
[im 37/83  soft-tissue]
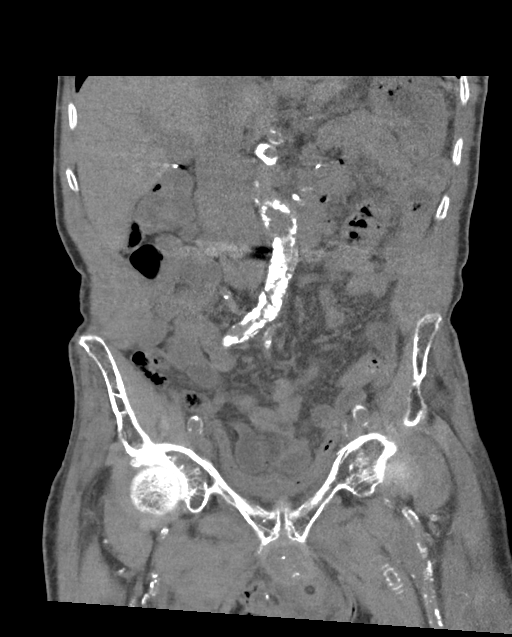
[im 46/83  soft-tissue]
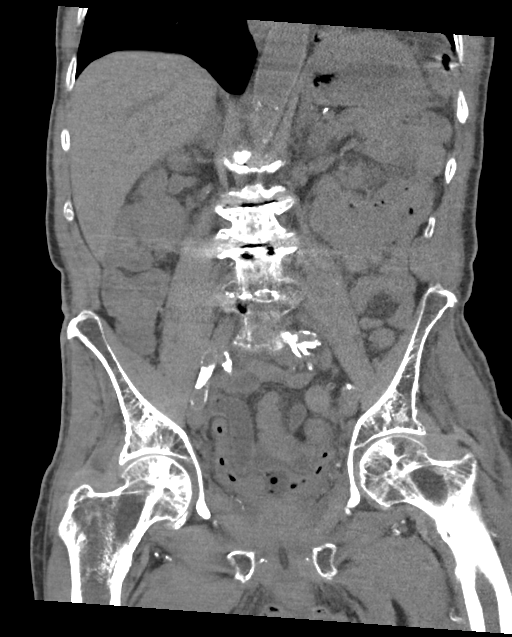

[13 of 46 positions shown; findings below may reference images not displayed]

FINDINGS: Lower chest: Coronary artery calcifications are noted. The partially
visualized heart size is moderately to markedly enlarged which
appears new from prior. The heart measures up to approximately 15 cm
in greatest transverse dimension (axial image 1), compared to
cm previously.

Lack of intra-articular fluid limits evaluation of the abdominal and
pelvic organ parenchyma. The following findings are made within this
limitation.

Hepatobiliary: There is again a minimally nodular contour of the
liver. No gross liver lesion is seen. Status post cholecystectomy.
Unchanged dystrophic 5 mm calcification within the right hepatic
lobe (axial image 22).

Pancreas: Not well evaluated given lack of IV contrast.

Spleen: Grossly unremarkable. The superior aspect of the spleen is
not imaged. There does appear to be new elevation of the left
hemidiaphragm and elevation of the left upper quadrant mesentery and
organ parenchyma into the region of the inferior left hemithorax
possibly due to lung volume loss but not well evaluated on the
current study. Note is made there is left basilar opacification on
04/11/2021, 04/10/2021, and 01/23/2021 suspicious for pneumonia.

Adrenals/Urinary Tract: There are mild-to-moderate vascular
calcifications within the bilateral renal pelves. Fluid attenuation
left upper pole 15 mm cyst (axial image 10). Punctate likely
vascular calcifications versus punctate nonobstructing stones within
the upper pole of the right kidney. No hydronephrosis is seen.
Resolution of the prior mild bilateral hydronephrosis. A Foley
catheter is seen within the urinary bladder which is markedly
decompressed. There is again scattered air within the urinary
bladder wall. This is decreased compared to 07/21/2020. Findings are
again concerning for emphysematous cystitis. There is a
mild-to-moderate amount of nondependent intraluminal air within the
urinary bladder which may be secondary to Foley catheter placement
or infection. Previously there was extension of air outside of the
urinary bladder wall into the anterior peritoneal wall and fat
planes and bilateral pelvic sidewalls; this is not seen on the
current study.

Stomach/Bowel: There is again mild-to-moderate circumferential wall
thickening of the rectum. There is new decompression and mild
circumferential wall thickening of the entire sigmoid colon and the
majority of the descending colon. This is suggestive of colitis. No
dilated loops of bowel to indicate bowel obstruction.

Vascular/Lymphatic: No abdominal aortic aneurysm. Moderate to
high-grade vascular calcifications. No enlarged abdominal or pelvic
lymph nodes.

Reproductive: The prostate is grossly unremarkable. Air is no longer
visualized within the urethra. There are calcifications again seen
around the musculature of the penis.

Other: No pneumoperitoneum is seen. No free fluid is seen. There is
mild stranding throughout the visualized mesentery compatible with
mild edema, similar to prior. No anterior abdominal wall hernia.

Musculoskeletal: There is again loss of subcutaneous fat and
intra-fat and cachexia. Diffuse decreased bone mineralization.
Postsurgical changes are seen of right L4 through S1 and left L4 and
S1 transpedicular rod and screw fusion with L4-5 and L5-S1
intervertebral disc spacers. Severe degenerative disc and endplate
changes throughout the lumbar spine.
IMPRESSION: 1. Compared to 07/21/2020, there is again air throughout the urinary
bladder wall and also air within the urinary bladder lumen. This is
again concerning for emphysematous cystitis. Air is no longer
visualized within the urethra, anterior peritoneal, and lateral
pelvic sidewalls. Recommend clinical correlation.
2. Resolution of the prior mild bilateral hydronephrosis.
3. There is again mild-to-moderate circumferential wall thickening
of the rectum. New decompression and mild circumferential wall
thickening of the entire sigmoid colon in the majority of the
descending colon. This is suggestive of colitis.
4. New elevation of the left hemidiaphragm. This may correspond to
the volume loss and opacity concerning for pneumonia on recent
radiographs as well as 01/23/2021 radiograph.
5. There is moderate cardiomegaly new compared to 07/21/2020 CT.
This change is also seen on 04/11/2021 versus 07/21/2020
radiographs.

Aortic Atherosclerosis ([id number]).

## 2023-07-05 IMAGING — DX DG CHEST 1V PORT
1 series · 1 of 1 positions shown · non-contrast
Comparison: 04/11/2021 portable chest.

CLINICAL DATA: Shortness of breath.

EXAM:
PORTABLE CHEST 1 VIEW

[chest ap]
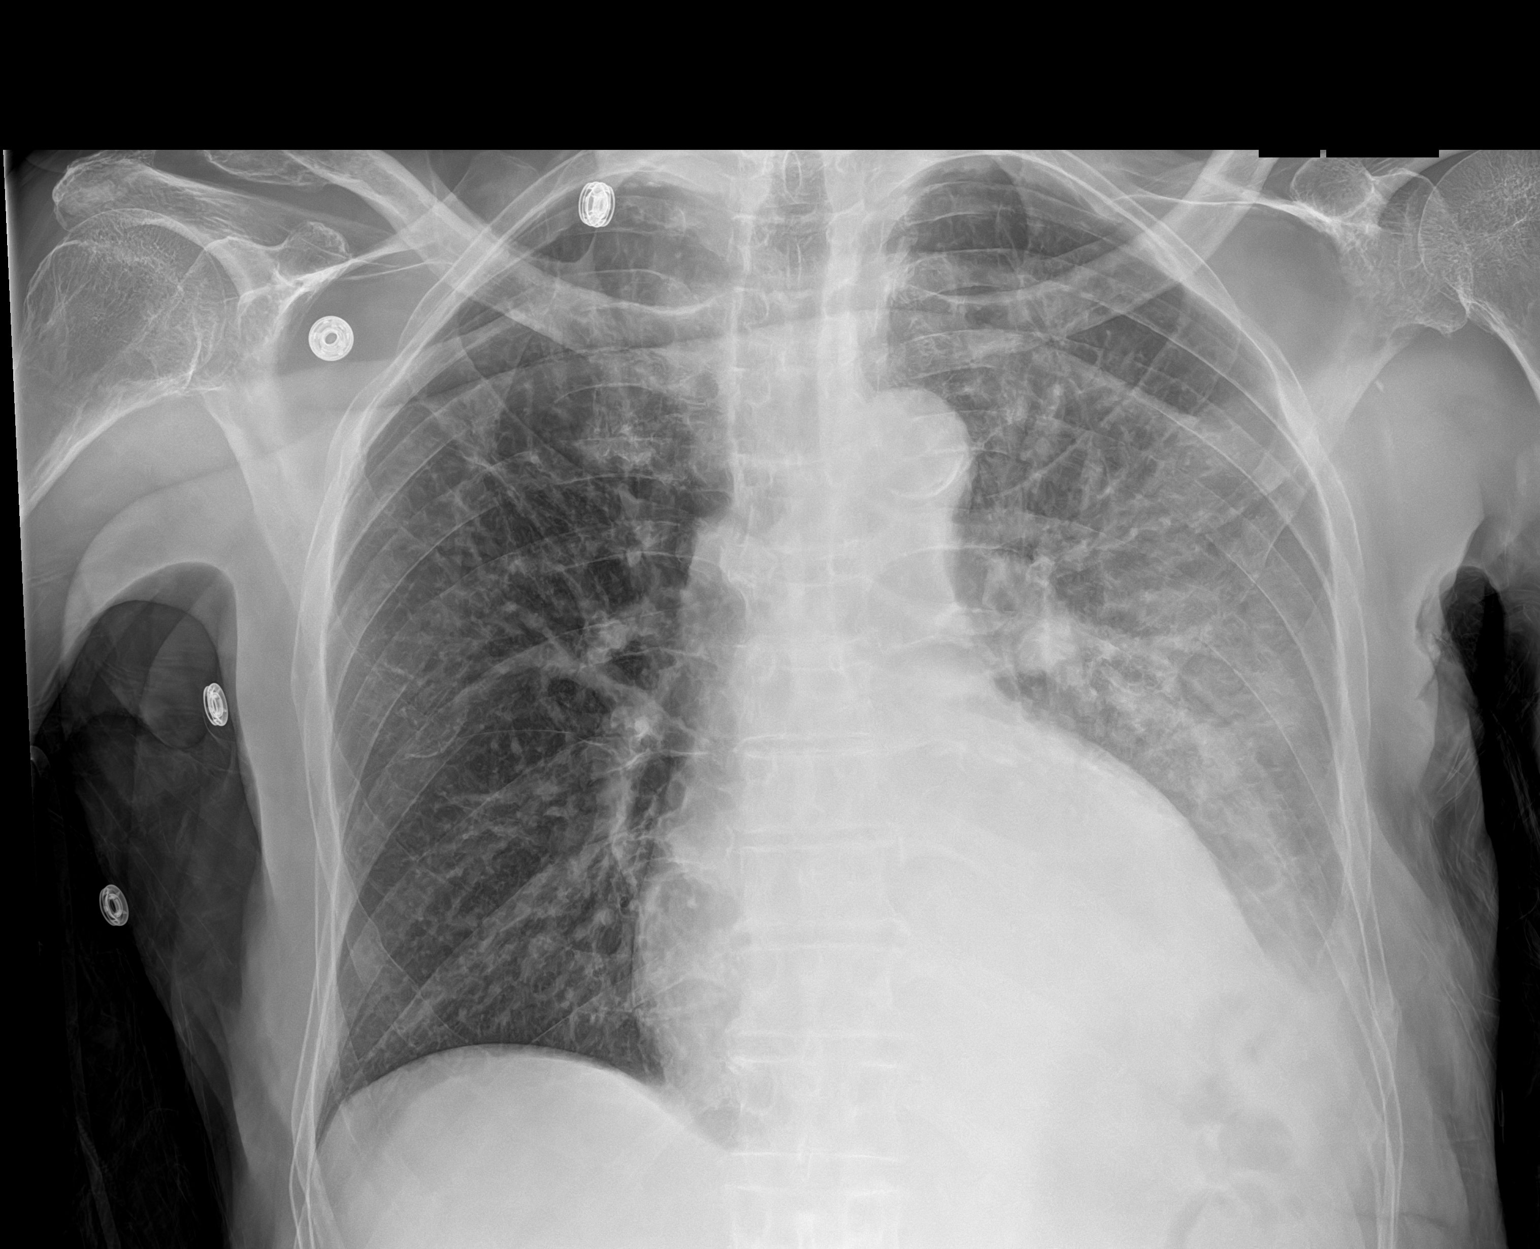

[1 of 1 positions shown; findings below may reference images not displayed]

FINDINGS: [DATE] a.m., 04/16/2021 cardiomegaly is again noted, and mild central
vascular distention without overt edema.

There is increasing layering moderate left pleural effusion and
worsening consolidation in the left mid and lower lung field with
increased opacity now extending to the mid field.

Left apex and right lung remain clear. Stable mediastinum with
aortic atherosclerosis. Osteopenia and thoracic spondylosis.
IMPRESSION: Worsening consolidation and underlying pleural effusion left
mid/lower zone. Stable cardiomegaly.

## 2023-07-06 IMAGING — US US THORACENTESIS ASP PLEURAL SPACE W/IMG GUIDE
2 series · 5 of 5 positions shown · non-contrast
Comparison: none

INDICATION: Small LEFT pleural effusion

[Series 1: us thoracentesis asp pleural space w/img guide · 4 of 4 slices shown (1 of 2)]
[im 1/4]
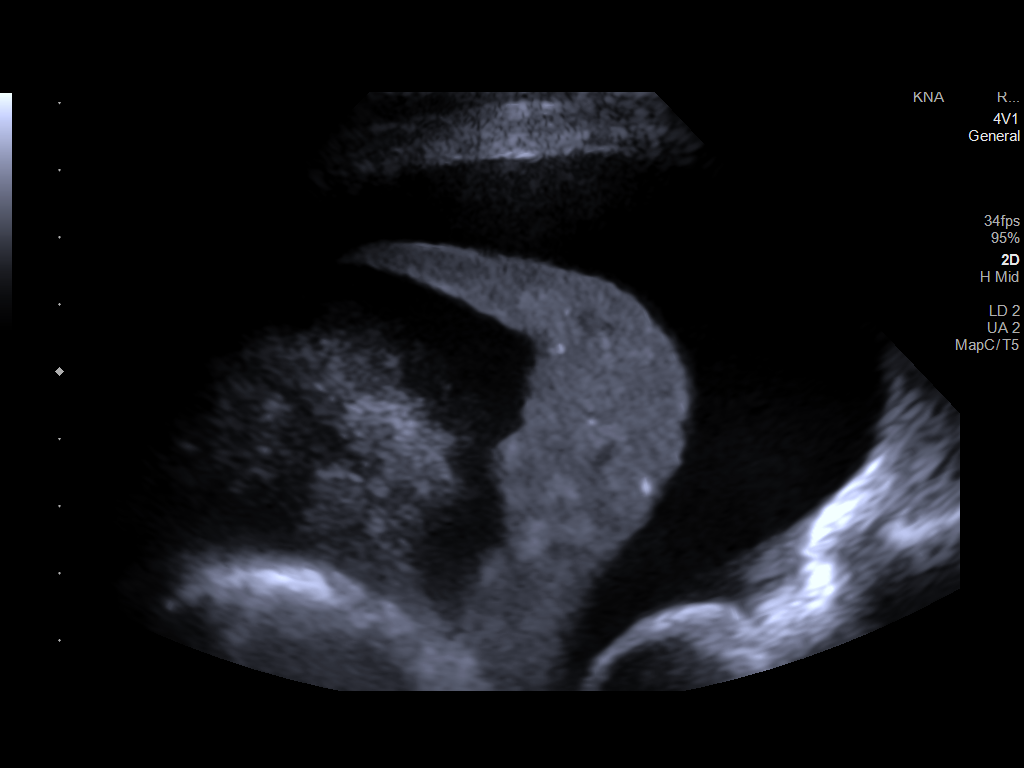
[im 2/4]
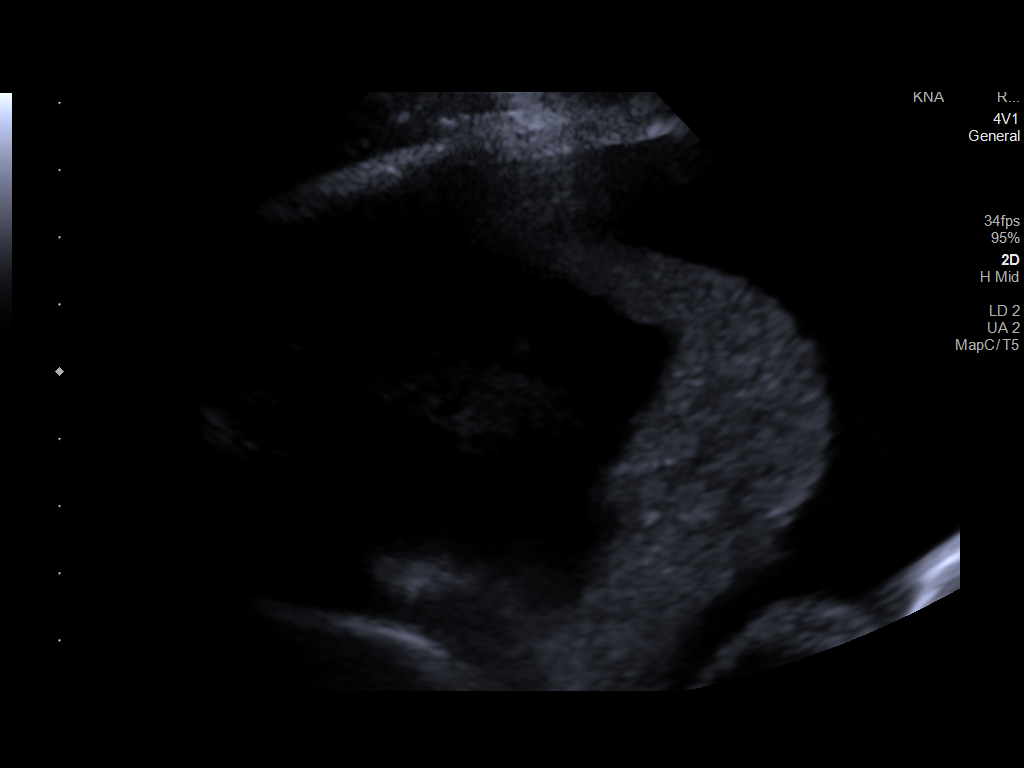
[im 3/4]
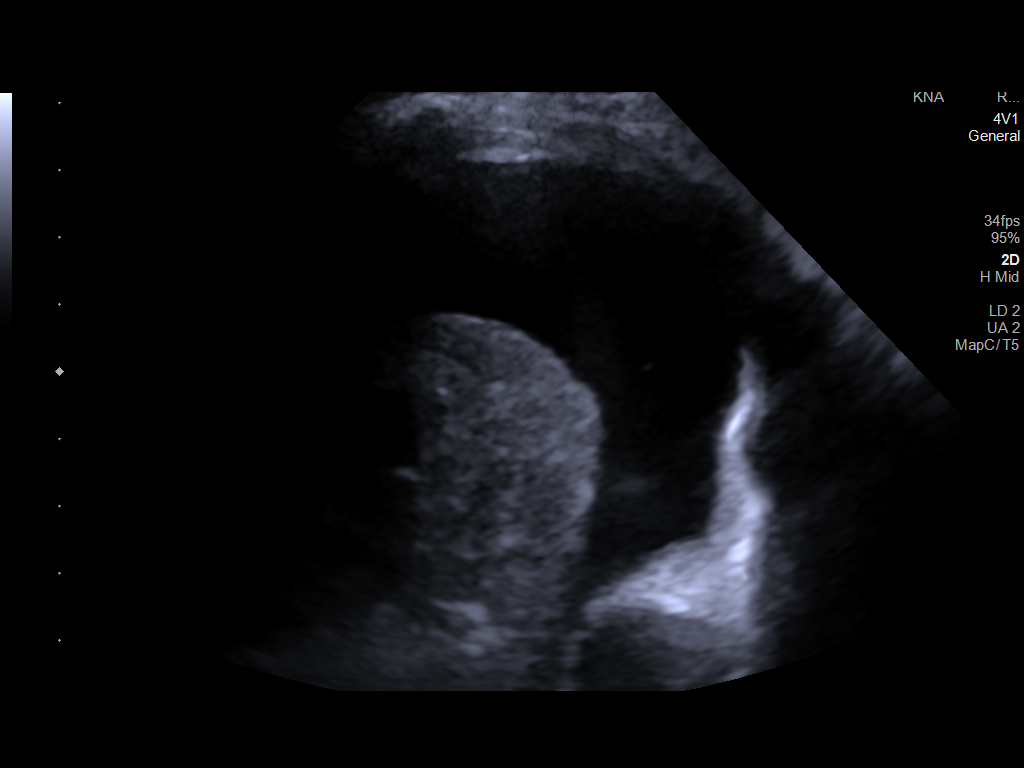
[im 4/4]
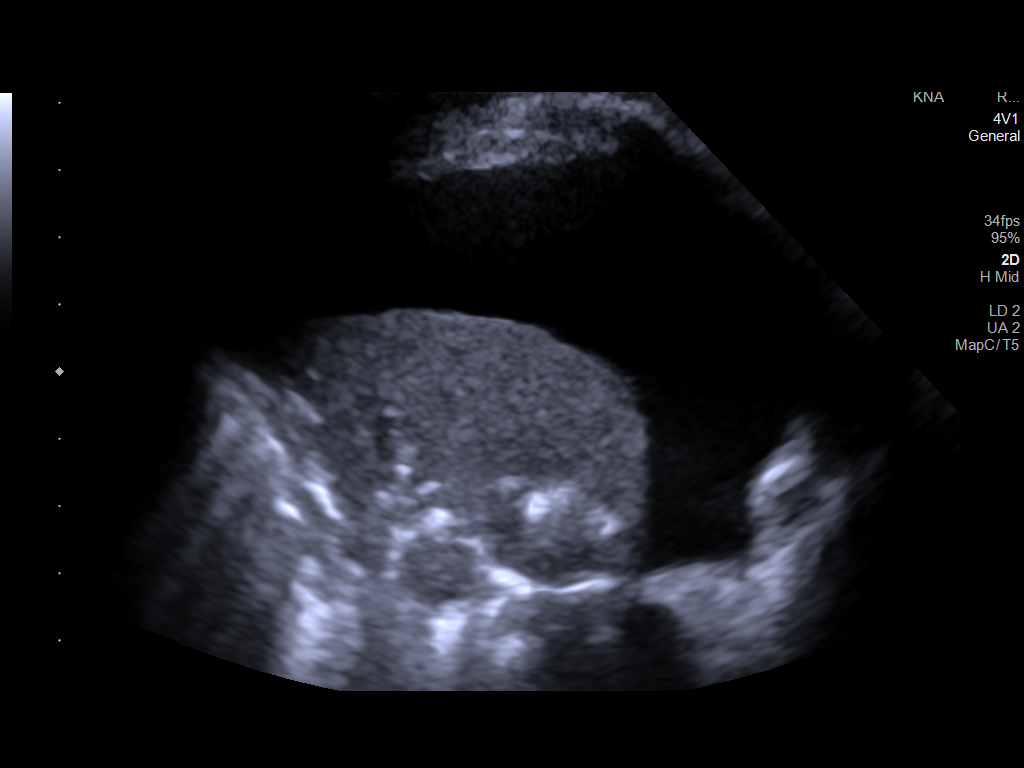

[Series 1001: us thoracentesis asp pleural space w/img guide · 1 of 1 slices shown (2 of 2)]
[im 1/1]
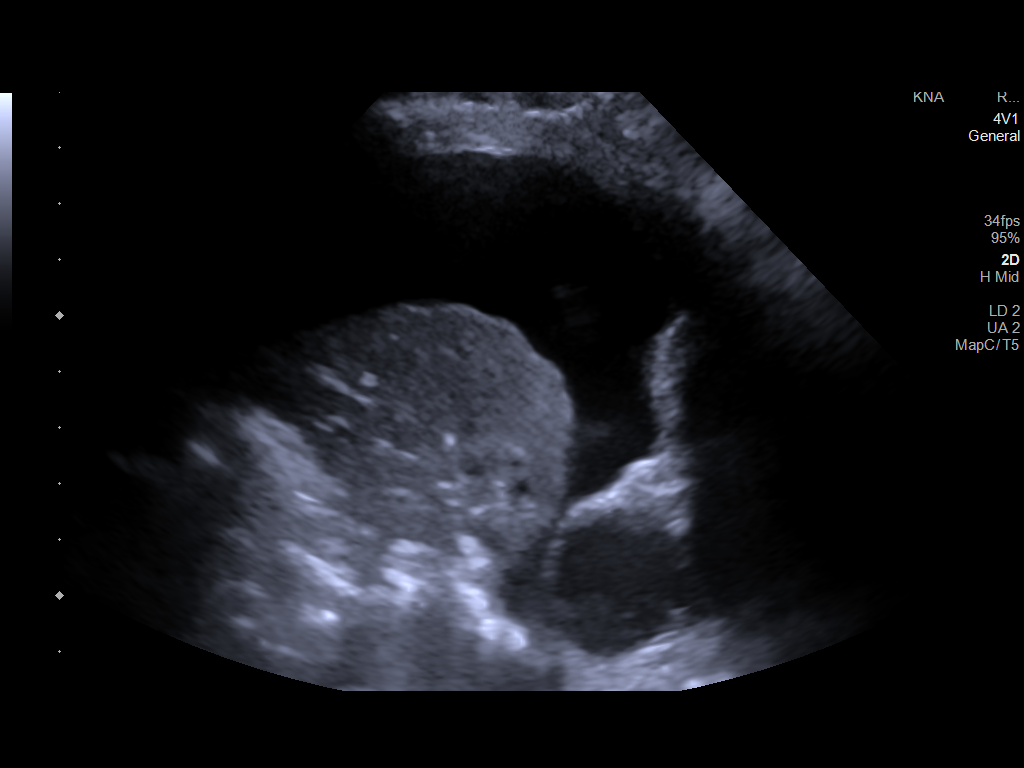

[5 of 5 positions shown; findings below may reference images not displayed]

EXAM:
ULTRASOUND GUIDED DIAGNOSTIC LEFT THORACENTESIS

MEDICATIONS:
None.

COMPLICATIONS:
None immediate.

PROCEDURE:
Written informed consent was obtained from the patient's wife.

Ultrasound was performed to localize and mark an adequate pocket of
fluid in the LEFT chest. The area was then prepped and draped in the
normal sterile fashion. 1% Lidocaine was used for local anesthesia.
Under ultrasound guidance a 5 French Yueh catheter was introduced.
Thoracentesis was performed. The catheter was removed and a dressing
applied.
FINDINGS: A total of approximately 240 mL of clear yellow fluid was removed.
Samples were sent to the laboratory as requested by the clinical
team.
IMPRESSION: Successful ultrasound guided LEFT thoracentesis yielding 240 mL of
pleural fluid.
